# Patient Record
Sex: Male | Born: 1935
Health system: Southern US, Community
[De-identification: ages and names within clinical notes are randomized; demographics above are authoritative.]

## PROBLEM LIST (undated history)

## (undated) DIAGNOSIS — R55 Syncope and collapse: Secondary | ICD-10-CM

## (undated) DIAGNOSIS — IMO0002 Reserved for concepts with insufficient information to code with codable children: Secondary | ICD-10-CM

## (undated) DIAGNOSIS — R269 Unspecified abnormalities of gait and mobility: Secondary | ICD-10-CM

## (undated) DIAGNOSIS — I2510 Atherosclerotic heart disease of native coronary artery without angina pectoris: Secondary | ICD-10-CM

## (undated) DIAGNOSIS — E039 Hypothyroidism, unspecified: Secondary | ICD-10-CM

## (undated) DIAGNOSIS — K649 Unspecified hemorrhoids: Secondary | ICD-10-CM

## (undated) DIAGNOSIS — I639 Cerebral infarction, unspecified: Secondary | ICD-10-CM

## (undated) DIAGNOSIS — S46009A Unspecified injury of muscle(s) and tendon(s) of the rotator cuff of unspecified shoulder, initial encounter: Secondary | ICD-10-CM

## (undated) DIAGNOSIS — N400 Enlarged prostate without lower urinary tract symptoms: Secondary | ICD-10-CM

## (undated) DIAGNOSIS — I6529 Occlusion and stenosis of unspecified carotid artery: Secondary | ICD-10-CM

## (undated) DIAGNOSIS — Z8614 Personal history of Methicillin resistant Staphylococcus aureus infection: Secondary | ICD-10-CM

## (undated) DIAGNOSIS — I4892 Unspecified atrial flutter: Secondary | ICD-10-CM

## (undated) DIAGNOSIS — Z9289 Personal history of other medical treatment: Secondary | ICD-10-CM

## (undated) DIAGNOSIS — G40909 Epilepsy, unspecified, not intractable, without status epilepticus: Secondary | ICD-10-CM

## (undated) DIAGNOSIS — E785 Hyperlipidemia, unspecified: Secondary | ICD-10-CM

## (undated) DIAGNOSIS — I10 Essential (primary) hypertension: Secondary | ICD-10-CM

## (undated) DIAGNOSIS — E119 Type 2 diabetes mellitus without complications: Secondary | ICD-10-CM

## (undated) DIAGNOSIS — I219 Acute myocardial infarction, unspecified: Secondary | ICD-10-CM

## (undated) HISTORY — DX: Type 2 diabetes mellitus without complications: E11.9

## (undated) HISTORY — DX: Unspecified injury of muscle(s) and tendon(s) of the rotator cuff of unspecified shoulder, initial encounter: S46.009A

## (undated) HISTORY — DX: Benign prostatic hyperplasia without lower urinary tract symptoms: N40.0

## (undated) HISTORY — DX: Hyperlipidemia, unspecified: E78.5

## (undated) HISTORY — DX: Essential (primary) hypertension: I10

## (undated) HISTORY — PX: ROTATOR CUFF REPAIR: SHX139

## (undated) HISTORY — DX: Unspecified hemorrhoids: K64.9

## (undated) HISTORY — DX: Epilepsy, unspecified, not intractable, without status epilepticus: G40.909

## (undated) HISTORY — DX: Atherosclerotic heart disease of native coronary artery without angina pectoris: I25.10

## (undated) HISTORY — DX: Personal history of Methicillin resistant Staphylococcus aureus infection: Z86.14

## (undated) HISTORY — DX: Syncope and collapse: R55

## (undated) HISTORY — DX: Occlusion and stenosis of unspecified carotid artery: I65.29

## (undated) HISTORY — DX: Cerebral infarction, unspecified: I63.9

## (undated) HISTORY — DX: Unspecified atrial flutter: I48.92

## (undated) HISTORY — DX: Reserved for concepts with insufficient information to code with codable children: IMO0002

## (undated) HISTORY — DX: Personal history of other medical treatment: Z92.89

## (undated) HISTORY — DX: Unspecified abnormalities of gait and mobility: R26.9

## (undated) HISTORY — PX: COLONOSCOPY: SHX174

## (undated) HISTORY — DX: Hypothyroidism, unspecified: E03.9

## (undated) HISTORY — DX: Acute myocardial infarction, unspecified: I21.9

---

## 2000-04-23 ENCOUNTER — Encounter: Payer: Self-pay | Admitting: Emergency Medicine

## 2000-04-23 ENCOUNTER — Emergency Department (HOSPITAL_COMMUNITY): Admission: EM | Admit: 2000-04-23 | Discharge: 2000-04-23 | Payer: Self-pay | Admitting: Emergency Medicine

## 2000-05-21 ENCOUNTER — Emergency Department (HOSPITAL_COMMUNITY): Admission: EM | Admit: 2000-05-21 | Discharge: 2000-05-21 | Payer: Self-pay | Admitting: Emergency Medicine

## 2000-05-21 ENCOUNTER — Encounter: Payer: Self-pay | Admitting: Emergency Medicine

## 2002-06-07 ENCOUNTER — Encounter: Payer: Self-pay | Admitting: Family Medicine

## 2002-06-07 ENCOUNTER — Encounter: Admission: RE | Admit: 2002-06-07 | Discharge: 2002-06-07 | Payer: Self-pay | Admitting: Family Medicine

## 2002-07-14 ENCOUNTER — Encounter: Payer: Self-pay | Admitting: Cardiology

## 2002-07-14 ENCOUNTER — Inpatient Hospital Stay (HOSPITAL_COMMUNITY): Admission: EM | Admit: 2002-07-14 | Discharge: 2002-07-17 | Payer: Self-pay | Admitting: Emergency Medicine

## 2002-07-14 HISTORY — PX: CORONARY ANGIOPLASTY WITH STENT PLACEMENT: SHX49

## 2002-09-24 ENCOUNTER — Encounter (HOSPITAL_COMMUNITY): Admission: RE | Admit: 2002-09-24 | Discharge: 2002-12-23 | Payer: Self-pay | Admitting: Cardiology

## 2003-05-14 ENCOUNTER — Ambulatory Visit (HOSPITAL_COMMUNITY): Admission: RE | Admit: 2003-05-14 | Discharge: 2003-05-14 | Payer: Self-pay | Admitting: Internal Medicine

## 2003-05-14 ENCOUNTER — Encounter (INDEPENDENT_AMBULATORY_CARE_PROVIDER_SITE_OTHER): Payer: Self-pay | Admitting: *Deleted

## 2004-03-05 ENCOUNTER — Ambulatory Visit: Payer: Self-pay | Admitting: Cardiology

## 2005-04-30 ENCOUNTER — Ambulatory Visit: Payer: Self-pay | Admitting: Cardiology

## 2005-05-20 ENCOUNTER — Ambulatory Visit: Payer: Self-pay

## 2005-05-27 ENCOUNTER — Ambulatory Visit: Payer: Self-pay | Admitting: Cardiology

## 2005-09-11 ENCOUNTER — Emergency Department (HOSPITAL_COMMUNITY): Admission: EM | Admit: 2005-09-11 | Discharge: 2005-09-11 | Payer: Self-pay | Admitting: Emergency Medicine

## 2006-03-22 DIAGNOSIS — I219 Acute myocardial infarction, unspecified: Secondary | ICD-10-CM

## 2006-03-22 HISTORY — DX: Acute myocardial infarction, unspecified: I21.9

## 2007-07-18 ENCOUNTER — Ambulatory Visit: Payer: Self-pay | Admitting: Cardiology

## 2007-07-21 ENCOUNTER — Ambulatory Visit: Payer: Self-pay

## 2007-08-22 ENCOUNTER — Emergency Department (HOSPITAL_COMMUNITY): Admission: EM | Admit: 2007-08-22 | Discharge: 2007-08-22 | Payer: Self-pay | Admitting: Emergency Medicine

## 2007-08-25 ENCOUNTER — Ambulatory Visit (HOSPITAL_COMMUNITY): Admission: RE | Admit: 2007-08-25 | Discharge: 2007-08-25 | Payer: Self-pay | Admitting: Family Medicine

## 2007-09-05 ENCOUNTER — Ambulatory Visit: Payer: Self-pay

## 2007-10-02 ENCOUNTER — Ambulatory Visit (HOSPITAL_COMMUNITY): Admission: RE | Admit: 2007-10-02 | Discharge: 2007-10-02 | Payer: Self-pay | Admitting: Family Medicine

## 2007-10-04 ENCOUNTER — Inpatient Hospital Stay (HOSPITAL_COMMUNITY): Admission: EM | Admit: 2007-10-04 | Discharge: 2007-10-11 | Payer: Self-pay | Admitting: Emergency Medicine

## 2007-10-04 ENCOUNTER — Ambulatory Visit: Payer: Self-pay | Admitting: Cardiology

## 2007-10-10 ENCOUNTER — Encounter: Payer: Self-pay | Admitting: Cardiology

## 2007-10-30 ENCOUNTER — Ambulatory Visit: Payer: Self-pay | Admitting: Cardiology

## 2007-12-07 ENCOUNTER — Ambulatory Visit: Payer: Self-pay | Admitting: Cardiology

## 2008-03-01 ENCOUNTER — Emergency Department (HOSPITAL_COMMUNITY): Admission: EM | Admit: 2008-03-01 | Discharge: 2008-03-02 | Payer: Self-pay | Admitting: Emergency Medicine

## 2008-04-10 ENCOUNTER — Ambulatory Visit (HOSPITAL_COMMUNITY): Admission: RE | Admit: 2008-04-10 | Discharge: 2008-04-11 | Payer: Self-pay | Admitting: Surgery

## 2008-04-10 ENCOUNTER — Encounter (INDEPENDENT_AMBULATORY_CARE_PROVIDER_SITE_OTHER): Payer: Self-pay | Admitting: Surgery

## 2008-04-10 HISTORY — PX: HERNIA REPAIR: SHX51

## 2009-06-05 ENCOUNTER — Encounter: Admission: RE | Admit: 2009-06-05 | Discharge: 2009-06-05 | Payer: Self-pay | Admitting: Otolaryngology

## 2009-06-10 ENCOUNTER — Ambulatory Visit (HOSPITAL_BASED_OUTPATIENT_CLINIC_OR_DEPARTMENT_OTHER): Admission: RE | Admit: 2009-06-10 | Discharge: 2009-06-11 | Payer: Self-pay | Admitting: Otolaryngology

## 2009-06-11 HISTORY — PX: ENDOLYMPHATIC SHUNT DECOMPRESSION: SHX1498

## 2009-06-12 ENCOUNTER — Emergency Department (HOSPITAL_COMMUNITY): Admission: EM | Admit: 2009-06-12 | Discharge: 2009-06-12 | Payer: Self-pay | Admitting: Emergency Medicine

## 2009-07-14 ENCOUNTER — Encounter: Admission: RE | Admit: 2009-07-14 | Discharge: 2009-07-14 | Payer: Self-pay | Admitting: Otolaryngology

## 2010-06-12 LAB — BASIC METABOLIC PANEL
BUN: 12 mg/dL (ref 6–23)
CO2: 32 mEq/L (ref 19–32)
Calcium: 9 mg/dL (ref 8.4–10.5)
Chloride: 98 mEq/L (ref 96–112)
Creatinine, Ser: 0.92 mg/dL (ref 0.4–1.5)
GFR calc Af Amer: >60 mL/min (ref >60)
GFR calc non Af Amer: >60 mL/min (ref >60)
Glucose, Bld: 99 mg/dL (ref 70–99)
Potassium: 4.5 mEq/L (ref 3.5–5.1)
Sodium: 137 mEq/L (ref 135–145)

## 2010-06-12 LAB — POCT HEMOGLOBIN-HEMACUE: Hemoglobin: 13.7 g/dL (ref 13.0–17.0)

## 2010-07-06 LAB — DIFFERENTIAL
Basophils Absolute: 0 10*3/uL (ref 0.0–0.1)
Basophils Relative: 0 % (ref 0–1)
Eosinophils Absolute: 0.1 10*3/uL (ref 0.0–0.7)
Eosinophils Relative: 1 % (ref 0–5)
Lymphocytes Relative: 15 % (ref 12–46)
Lymphs Abs: 1.4 10*3/uL (ref 0.7–4.0)
Monocytes Absolute: 1.2 10*3/uL — ABNORMAL HIGH (ref 0.1–1.0)
Monocytes Relative: 12 % (ref 3–12)
Neutro Abs: 7.1 10*3/uL (ref 1.7–7.7)
Neutrophils Relative %: 72 % (ref 43–77)

## 2010-07-06 LAB — HEMOGLOBIN AND HEMATOCRIT, BLOOD
HCT: 42.4 % (ref 39.0–52.0)
Hemoglobin: 14 g/dL (ref 13.0–17.0)

## 2010-07-06 LAB — CBC
HCT: 38.7 % — ABNORMAL LOW (ref 39.0–52.0)
Hemoglobin: 13.1 g/dL (ref 13.0–17.0)
MCHC: 33.7 g/dL (ref 30.0–36.0)
MCV: 91.9 fL (ref 78.0–100.0)
Platelets: 205 10*3/uL (ref 150–400)
RBC: 4.21 MIL/uL — ABNORMAL LOW (ref 4.22–5.81)
RDW: 14.6 % (ref 11.5–15.5)
WBC: 9.9 10*3/uL (ref 4.0–10.5)

## 2010-07-06 LAB — BASIC METABOLIC PANEL
BUN: 19 mg/dL (ref 6–23)
CO2: 31 mEq/L (ref 19–32)
Calcium: 9.7 mg/dL (ref 8.4–10.5)
Chloride: 97 mEq/L (ref 96–112)
Creatinine, Ser: 1.25 mg/dL (ref 0.4–1.5)
GFR calc Af Amer: >60 mL/min (ref >60)
GFR calc non Af Amer: 57 mL/min — ABNORMAL LOW (ref >60)
Glucose, Bld: 143 mg/dL — ABNORMAL HIGH (ref 70–99)
Potassium: 4.9 mEq/L (ref 3.5–5.1)
Sodium: 137 mEq/L (ref 135–145)

## 2010-08-04 NOTE — Assessment & Plan Note (Signed)
[facility]                            CARDIOLOGY OFFICE NOTE   [name], [name]                     MRN:          [id number]  DATE:10/30/2007                            DOB:          April 03, 1933    PRIMARY CARE PHYSICIAN:  Daniel Laroche, MD, [facility].   REASON FOR PRESENTATION:  Evaluate the patient with presyncope.   HISTORY OF PRESENT ILLNESS:  The patient was recently hospitalized on  October 04, 2007 after a near-syncopal episode.  He does not think he lost  consciousness.  This is his third episode.  The others occurring about 2  and 4 weeks before this event respectively.  This episode happened at  [facility], while he was talking to some parishioners.  He then became  suddenly presyncopal falling down to the ground or into the pew.  There  was no prodrome.  There were no palpitations, presyncope, or syncope.  He was standing when this happened.  This was similar to the other 2  episodes.  He had an extensive neurological workup.  There was suspicion  of high-grade right vertebral artery stenosis, but apparently cerebral  angiogram did not confirm this.  The patient also had other neurologic  evaluation including an EEG.  An echocardiogram demonstrated mild  fibrocalcific changes of the aortic root, but no other significant  abnormalities.  Prior to this hospitalization, the patient had had a  stress perfusion study in our office, which demonstrated no evidence of  ischemia or infarct with an EF 68%.  He also wore an event monitor for 3  weeks and did not have any brady or tachyarrhythmias.  However, he did  not have any of his presyncopal spells while wearing this monitor.  He  now presents for followup.  He has had no further episodes.  He is not  having any chest pressure, neck or arm discomfort.  He is not having any  shortness of breath, PND, or orthopnea.  He has seen a neurologist and  actually it is being considered that  he might have Meniere disease, and  he is being referred for ENT evaluation.   PAST MEDICAL HISTORY:  Coronary artery disease (95% right coronary  artery stenosis with infarct in April 2004.  This was treated with a  stent), well-preserved ejection fraction, dyslipidemia, seizure  disorder, hypothyroidism, degenerative joint disease, left buttocks  abscess (methicillin-resistant Staph aureus), and chronic rotator cuff  repair.   ALLERGIES/INTOLERANCES:  None.   MEDICATIONS:  Aspirin 81 mg daily, Carbatrol 300 mg b.i.d., diazepam 2  mg b.i.d., Lipitor 80 mg daily, levothyroxine 25 mcg daily,  hydrochlorothiazide 12.5 mg daily, lisinopril 40 mg daily, clonidine 0.1  mg b.i.d., Uroxatral 10 mg daily, and metoprolol 50 mg b.i.d.   REVIEW OF SYSTEMS:  As stated in the HPI and otherwise negative for  other systems.   PHYSICAL EXAMINATION:  GENERAL:  The patient is in no distress.  VITAL SIGNS:  Blood pressure 134/76, heart rate 75 and regular, and  weight 246 pounds.  HEENT:  Eyes unremarkable; pupils equal, round, and reactive to light;  fundi not visualized; oral mucosa unremarkable.  NECK:  No jugular venous distention at 45 degrees; carotid upstroke  brisk and symmetrical; no bruits, no thyromegaly.  LYMPHATICS:  No cervical, axillary, or inguinal adenopathy.  LUNGS:  Clear to auscultation bilaterally.  BACK:  No costovertebral angle tenderness.  CHEST:  Unremarkable.  HEART:  PMI not displaced or sustained; S1 and S2 within normal limits;  no S3 and no S4; no clicks, rubs, or murmurs.  ABDOMEN:  Obese; positive bowel sounds, normal in frequency and pitch;  no bruits, rebound, guarding, or midline pulsatile mass; no  hepatomegaly, no splenomegaly.  SKIN:  No rashes, no nodules.  EXTREMITIES:  Pulses 2+ throughout; no edema, cyanosis, or clubbing.  NEURO:  Oriented to person, place, and time; cranial nerves II-XII  grossly intact; motor grossly intact.   ASSESSMENT AND PLAN:   1. Presyncope.  The patient has had an extensive neuro workup and may      have Meniere disease.  He is going to have his ENT evaluation.  If      they do not think that this is the etiology, then the next step      would most likely need to be an implanted loop monitor.  I have      discussed this with the patient.  He will confirm his appointment      with ENT and see me after this.  2. Coronary artery disease.  He had negative stress perfusion study.      He is having no chest pain.  No further cardiovascular testing is      suggested.  3. Hypertension.  Blood pressure is currently well controlled, and he      will continue the medications as listed.  4. Dyslipidemia, per his primary care doctor.  He is on Lipitor.      Goals are LDL less than 100 and HDL greater than 40.  5. Followup.  I will see him back after the ENT appointment as      described.     Daniel Rotunda, MD, [facility]  Electronically Signed    JH/MedQ  DD: 10/30/2007  DT: 10/31/2007  Job #: [id number]   cc:   Daniel Laroche, MD

## 2010-08-04 NOTE — H&P (Signed)
NAME:  [name], [name] NO.:  [id number]   MEDICAL RECORD NO.:  [id number]          PATIENT TYPE:  INP   LOCATION:  5114                         FACILITY:  [facility]   PHYSICIAN:  Jennelle Human. Marisue Humble, MD DATE OF BIRTH:  11/10/1933   DATE OF ADMISSION:  10/04/2007  DATE OF DISCHARGE:                              HISTORY & PHYSICAL   CHIEF COMPLAINT:  Dizziness and fall.  In addition to that a boil.   HISTORY OF PRESENT ILLNESS:  This is a 77 year old gentleman who has had  inner ear problems for approximately 2-3 years with dizziness and  nausea, for which he has been on Carbatrol 300 mg twice a day.  Over the  last 6 weeks, he has had worsening episodes of quit-onset dizziness with  blurred vision where he becomes presyncopal and loses balance and has  fallen on multiple occasions.  This afternoon, he had another episode of  a presyncopal event where he lost balance and fell; however, he lost no  consciousness.  He was standing at the time at church when he suddenly  noticed his vision blurred, darkening visual fields, and fell over, but  was quick to get up.  He states that he has seen his primary care  physician recently and has had a workup.  He is to see a neurologist in  the next 2 months secondary to posterior cerebral artery narrowing and  vertebral artery stenosis.  However, the symptoms have worsened and he  does not feel as if he is going to make it in time to see the  neurologist in 2 months without a significant fall.  He and his family  are quite concerned.  He had a MRA of the neck performed on August 25, 2007, showed the carotid and basilar arteries were widely patent, showed  significant narrowing of the left posterior cerebral artery estimated to  75-90%, and there was evidence of right vertebral severe stenosis in the  upper cervical segment opposite C1 as it enters the foramen magnum.  He  has also had a recent MRI and has also had a event monitor to  rule out  cardiac arrhythmias as a cause of his sudden dizziness and near syncope.   Past medical history is significant for;  1. Coronary artery disease.  He has a history of a 95% right coronary      artery stenosis with infarction in April 2004.  This lesion was      treated.  He has a well-preserved ejection fraction.  2. Dyslipidemia.  3. Seizure disorder.  4. Hypothyroidism.  5. Degenerative joint disease.  6. History of MRSA abscess on his neck approximately 15 months ago.   ALLERGIES:  None.   Medications include;  1. Aspirin 81 mg daily.  2. Metoprolol 50 mg b.i.d.  3. Carbatrol 300 mg b.i.d.  4. Synthroid 25 mg daily.  5. Clonidine 0.1 mg b.i.d.  6. Lisinopril 40 mg daily.  7. Hydrochlorothiazide 12.5 mg daily.  8. Diazepam 2 mg p.o. b.i.d.  9. Lipitor 80 mg nightly.  10.Uroxatral 10 mg daily.  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  No  tobacco, ethanol, or drugs.  He lives with his wife.   FAMILY HISTORY:  Not significant for early coronary artery disease;  however, his dad did have an MI in his 80s.  Mom had a brain aneurysm  and died in her 28s.  He has two siblings.  One brother with a CABG and  the other brother is healthy.   REVIEW OF SYSTEMS:  Negative x10, except as stated in the HPI.   PHYSICAL EXAMINATION:  VITAL SIGNS:  Blood pressure 185/90, heart rate  82, respirations 18, pulse ox 98% on room air.  HEENT: Normal.  Neck: Normal jugular venous pressure, production of  normal.  No evident bruits.  CHEST:  Clear to auscultation bilaterally.  HEART:  Regular rate and rhythm.  No murmurs, gallops, or rubs.  ABDOMEN:  Soft, nondistended, nontender, good bowel sounds.  No  hepatosplenomegaly.  EXTREMITIES:  No clubbing, cyanosis, or edema.  SKIN:  He has a 3-cm x 3-cm boil on the inner aspect of his left upper  buttock.  With fluctuance noted.   ECG unremarkable.  Normal sinus rhythm.  No significant ST and T-wave   changes.   LABORATORY EXAM:  White count is slightly elevated at 12.9, hemoglobin  and hematocrit is 13 and 38.3, platelets of 226.  There is no  significant left shift.  Basic metabolic panel shows a sodium of 136,  potassium 4.1, chloride 100, BUN 15, creatinine 1.3, glucose 115,  ionized calcium 1.15.   ASSESSMENT AND PLAN:  1. Dizziness with near syncopal episodes.  This is most likely related      to his vertebral artery stenosis noted on the MRA of the neck.  We      have spoken with neurology.  They have recommended that we start      Plavix and he get a transcranial Dopplers done.  This is Dr.      Vickey Huger.  He has also had a Holter monitor for workup of cardiac      arrhythmias.  Actually, it was event monitor from what the patient      describes.  He turned this into [facility] approximately a      week ago.  The results of this study need to be obtained.  As      mentioned above, we will start the Plavix.  We will get the      transcranial Dopplers performed and neurology will see in the      morning.  2. History of coronary artery disease, currently stable.  He is on      beta blockade and aspirin.  As mentioned, we will add Plavix as per      neurology request.  We will follow up tomorrow with getting the      results of his Holter/event monitor that was turned in      approximately 1 week ago from [facility].  3. Left buttock abscess.  The abscess was I&D'ed in the ER by myself.      Approximately 5-6 mL of frank pus was aspirated.  This resulted in      a great reduction in the size of abscess; however, there is some      small fluctuance left.  Hopefully. this should drain slowly through      the small incision on left and I started him on vancomycin  1 g q.12      h.  He will get a vancomycin trough level before his fourth dose.  4. Hypertension.  I have currently restarted his metoprolol 50 mg      b.i.d., his clonidine 0.1 mg b.i.d., lisinopril  40      mg daily.  The Clonidine has had a suboptimal dose.  If his blood      pressure is still not under control one might consider clonidine      0.1 mg t.i.d. as b.i.d. will leave areas of uncontrolled blood      pressure.  5. Follow up.  The patient will be followed up tomorrow by neurology      as well as the hospitalist.      Jennelle Human. Marisue Humble, MD  Electronically Signed     GBS/MEDQ  D:  10/04/2007  T:  10/05/2007  Job:  [id number]

## 2010-08-04 NOTE — Assessment & Plan Note (Signed)
[facility]                            CARDIOLOGY OFFICE NOTE   [place], [place] ESTON HESLIN                     MRN:          [id number]  DATE:12/07/2007                            DOB:          [birth year]    REASON FOR PRESENTATION:  Evaluate the patient with presyncope.   HISTORY OF PRESENT ILLNESS:  The patient was hospitalized in July.  His  events of that hospitalization are described extensively in the October 30, 2007, note.  He was supposed to have an ENT evaluation, since we  have not seen a cardiac or neurologic etiology for his events.  However,  he has been unable to get this appointment.  He is still working on it.  However, since I last saw him, he has had no presyncope or syncope.  He  has had no palpitations.  He has had no chest pain or shortness of  breath.  He has been doing some walking, though he has not gotten back  to golfing.  He has been doing his activities of daily living without  any of these complaints.   PAST MEDICAL HISTORY:  Coronary artery disease (95% right coronary  artery stenosis with infarct in 2004.  He was treated with a stent.),  well preserved ejection fraction, dyslipidemia, seizure disorder,  hypothyroidism, degenerative joint disease, left buttocks abscess  (MRSA), chronic rotator cuff injury status post repair.   ALLERGIES/INTOLERANCES:  None.   MEDICATIONS:  1. Aspirin 81 mg daily.  2. Carbatrol 300 mg b.i.d.  3. Diazepam.  4. Lipitor 80 mg daily.  5. Levothyroxine 25 mcg daily.  6. Hydrochlorothiazide 12.5 mg daily.  7. Lisinopril 40 mg daily.  8. Clonidine 0.1 mg b.i.d.  9. Uroxatral.  10.Metoprolol 50 mg b.i.d.  11.Plavix 75 mg daily.   REVIEW OF SYSTEMS:  As stated in the HPI and otherwise negative for  other system.   PHYSICAL EXAMINATION:  GENERAL:  The patient is in no distress.  VITAL SIGNS:  Blood pressure 151/87, heart rate 70 and regular.  NECK:  No jugular venous distention at 45  degrees, carotid upstroke  brisk and symmetric, no bruits, no thyromegaly.  LUNGS:  Clear to auscultation bilaterally.  CHEST:  Unremarkable.  HEART:  PMI not displaced or sustained.  S1 and S2 within normal limits.  No S3, no S4, no clicks, no rubs, no murmurs.  ABDOMEN:  Obese, positive bowel sounds.  Normal in frequency and pitch.  No bruits, no rebound, no guarding, no midline pulsatile mass, no  organomegaly.  EXTREMITIES:  2+ pulse, no edema.   ASSESSMENT AND PLAN:  1. Presyncope.  The patient has had no further events.  I have      encouraged him to pursue the ENT evaluation.  He will let me know      if he has any further symptoms.  2. Coronary artery disease.  No further cardiovascular testing is      suggested.  He will continue with risk reduction.  3. Hypertension.  His blood pressure is slightly elevated today.  It      is well  controlled at home.  He will continue medications as      listed.  He needs a weight loss for continued blood pressure      control.  4. Dyslipidemia per his primary care doctor.  The goal is an LDL less      than 100 and HDL greater than 40.  5. Followup.  I will see him back in 6 months or sooner if he has any      problems.     Rollene Rotunda, MD, [facility]  Electronically Signed    JH/MedQ  DD: 12/07/2007  DT: 12/07/2007  Job #: [id number]   cc:   [address] [place], [place]

## 2010-08-04 NOTE — Consult Note (Signed)
NAME:  [name], [name] NO.:  [id number]   MEDICAL RECORD NO.:  [id number]          PATIENT TYPE:  INP   LOCATION:  5114                         FACILITY:  [facility]   PHYSICIAN:  Lennie Muckle, MD      DATE OF BIRTH:  12-20-1933   DATE OF CONSULTATION:  10/07/2007  DATE OF DISCHARGE:                                 CONSULTATION   REASON FOR CONSULTATION:  Left buttock abscess.   HISTORY OF PRESENT ILLNESS:  [name] is a 77 year old male who was  apparently admitted on October 04, 2007 due to syncopal episodes and  falling at home.  In his workup, was known to have a swelling and  possible abscess in his left buttock.  He had had previous incision and  drainage on October 04, 2007 in the emergency department with a small  amount of purulent fluid.  He was then placed on vancomycin, but has not  had significant amount of improvement.  He noted a pimple last week.  His white count is elevated at 14.8 today, it was previously 14.7 and he  has had fevers, 99.6.  He has had some uncomfortable sensation in his  buttock region.  Previously, he had an abscess on his left ear and has  been cultured to be MRSA positive.   PAST MEDICAL HISTORY:  Significant for:  1. Coronary artery disease.  2. Hyperlipidemia.  3. Hypothyroidism.  4. Arthritis.  5. Syncopal episodes.  6. Enlarged prostate.   MEDICATIONS:  While in the hospital include alprazolam, Tegretol,  Catapres, Plavix, Valium, Lovenox, Synthroid, lisinopril, metoprolol,  Crestor, vancomycin, morphine, oxycodone, and Phenergan.   SOCIAL HISTORY:  He is married.  He is a Runner, broadcasting/film/video.  No to current tobacco  or alcohol use.   FAMILY HISTORY:  His mother has a brain aneurysm.   REVIEW OF SYSTEMS:  As per the patient's chart, please review this.   PHYSICAL EXAMINATION:  GENERAL:  He is lying in the bed in no acute  distress.  VITAL SIGNS:  Temperature is 99.4 currently and blood pressure 124/72  and pulse 79.  EXTREMITIES:  Focused examination of the perineal area reveals a marked  induration on the left buttock cheek with erythema, somewhat tender to  palpation.  There is skin flap necrosis over the portion of this area.   ASSESSMENT AND PLAN:  Left buttock abscess.  Procedure:  I did perform incision and drainage of the abscess with #11  blade after anesthesizing the skin with 1% lidocaine.  There was  purulent fluid pocket measuring approximately 4 x 3 cm.  This was packed  with gauze.  I  would recommend continuing with antibiotics, starting b.i.d. dressing  changes.  He can shower when able, wash the area, and we will continue  to monitor.  Hopefully, this will require no further treatment, since it  has been drained today.      Lennie Muckle, MD  Electronically Signed     ALA/MEDQ  D:  10/07/2007  T:  10/08/2007  Job:  [id number]

## 2010-08-04 NOTE — Assessment & Plan Note (Signed)
[facility]                            CARDIOLOGY OFFICE NOTE   NAME:LOFTISYousif, [name]                     MRN:          [id number]  DATE:07/18/2007                            DOB:          09/04/1933    PRIMARY CARE PHYSICIAN:  Dr. Benjiman Core, [facility].   REASON FOR PRESENTATION:  Evaluate the patient with chest pain and  coronary disease.   HISTORY OF PRESENT ILLNESS:  The patient is a pleasant 77 year old  gentleman with a history of coronary disease as described below.  It has  been a couple of years since I last saw him.  He has been getting some  chest discomfort.  He thinks this is his indigestion.  It typically  happens after he is eating.  It is substernal pressure.  It goes away  spontaneously.  He does not think that it is like his previous angina.  There is no radiation to his neck into his arms.  No associated nausea,  vomiting or diaphoresis.  There is no associated shortness of breath.  Does get dyspneic when he first starts to walk.  He has a little chest  pressure.  The most exerting thing he does is weed eat.  It does not  necessarily bring on the symptoms with this.  He has had no cardiac  evaluation in quite awhile.  His weight has been slightly slowly  increasing.   PAST MEDICAL HISTORY:  1. Coronary artery disease (95% right coronary artery stenosis with      infarct April 2004.  This was treated with stent).  2. Well-preserved ejection fraction.  3. Dyslipidemia.  4. Seizure disorder.  5. Hypothyroidism.  6. Degenerative joint disease.   ALLERGIES:  NONE.   MEDICATIONS:  1. Aspirin 81 mg daily.  2. Carbatrol.  3. Diazepam 2 mg b.i.d.  4. Toprol 100 mg daily.  5. Lipitor 80 mg daily.  6. Levothyroxine 25 mcg daily.  7. Hydrochlorothiazide 12.5 mg daily.  8. Lisinopril 40 mg daily.  9. Clonidine 0.1 mg b.i.d.  10.Uroxatral.   REVIEW OF SYSTEMS:  As stated in the HPI, otherwise negative for other  systems.   PHYSICAL EXAMINATION:  GENERAL:  The patient is in no distress.  VITAL SIGNS:  Blood pressure 145/79, heart rate 73 and regular, weight  239 pounds, body mass index 36.  HEENT:  Eyelids are unremarkable, pupils equal, round, react to light,  fundi not visualized, oral mucosa unremarkable.  NECK:  No jugular distention at 45 degrees, carotid upstroke brisk and  symmetrical.  No bruits, thyromegaly.  LYMPHATICS:  No cervical, axillary, inguinal adenopathy.  LUNGS:  Clear to auscultation bilaterally.  BACK:  No costovertebral angle tenderness.  CHEST:  Unremarkable.  HEART:  PMI not displaced or sustained, S1-S2 within normal.  No S3, S4.  No clicks, rubs, murmurs.  ABDOMEN:  Morbidly obese, positive bowel sounds normal in frequency and  pitch, no bruits, rebound, guarding or midline pulsatile mass.  No  hepatomegaly, no splenomegaly.  SKIN:  No rashes.  No nodules.  EXTREMITIES:  Pulse 2+ throughout.  No edema, cyanosis or  clubbing.  NEUROLOGICAL:  Oriented to person, place and time, cranial nerves II-XII  grossly intact, motor grossly intact.   STUDIES:  EKG sinus rhythm, rate 61, axis within normal limits,  intervals within normal limits, borderline voltage criteria for left  ventricle hypertrophy, no acute ST-T wave changes.   ASSESSMENT/PLAN:  1. Chest.  The patient does have some chest discomfort that is      somewhat atypical.  However, he has known coronary disease and has      not had any testing in quite awhile.  The pretest probability of      obstructive coronary disease is moderately high.  Given this, an      exercise perfusion study is indicated.  If this is normal.  I will      assume that his discomfort is GI.  He does, however, continue to      need aggressive secondary risk reduction.  2. Hypertension.  Blood pressure is elevated slightly.  He has a blood      pressure cuff at home, but does not work well and he spent $100 on      it, so he is not  getting a new one.  I am not going to change his      medications, but I am going to insist on weight loss as described      below.  3. Obesity.  The patient is morbidly obese.  We discussed him joining      [employer].  He needs to do something to reverse this process.  4. Shortness of breath.  He does have some mild dyspnea that is      probably related to his weight and deconditioning.  It is not an      overt problem, and I will make no changes to his regimen based on      it specifically to treat this but rather would address the weight      loss as above.  5. Follow-up, I would like to see the patient back in about 6 months      or sooner if needed.     Rollene Rotunda, MD, [facility]  Electronically Signed   JH/MedQ  DD: 07/18/2007  DT: 07/18/2007  Job #: [id number]   cc:   Billee Cashing, MD

## 2010-08-04 NOTE — Discharge Summary (Signed)
NAME:  [name], [name] NO.:  [id number]   MEDICAL RECORD NO.:  [id number]          PATIENT TYPE:  INP   LOCATION:  5114                         FACILITY:  [facility]   PHYSICIAN:  Altha Harm, MDDATE OF BIRTH:  1933-08-11   DATE OF ADMISSION:  10/04/2007  DATE OF DISCHARGE:                               DISCHARGE SUMMARY   ADDENDUM TO DISCHARGE SUMMARY:  Please see the discharge summary from  July 21 and from July 19 for final discharge diagnoses.   HOSPITAL COURSE FROM YESTERDAY TO TODAY:  The patient's discharge was  deferred pending cardiology evaluation and recommendations.  The  cardiologist came back to see the patient at 8 p.m. last night and per  his notes in the chart did not feel that after reviewing the studies  that there was anything more that could be of any benefit to the patient  during this hospitalization.  The recommendation is that he see Dr.  Antoine Poche in follow-up.  Please also note that the patient is followed by  Dr. Vickey Huger, whose office will contact him to expedite the outpatient  appointment.  There has been no change in medications, no change in  follow-up.      Altha Harm, MD  Electronically Signed     MAM/MEDQ  D:  10/11/2007  T:  10/11/2007  Job:  [id number]

## 2010-08-04 NOTE — Discharge Summary (Signed)
NAME:  [name], [name] NO.:  [id number]   MEDICAL RECORD NO.:  [id number]          PATIENT TYPE:  INP   LOCATION:  5114                         FACILITY:  [facility]   PHYSICIAN:  Elliot Cousin, M.D.    DATE OF BIRTH:  02/11/1934   DATE OF ADMISSION:  10/04/2007  DATE OF DISCHARGE:                               DISCHARGE SUMMARY   DATE OF DISCHARGE:  To be determined.   DISCHARGE DIAGNOSES:  1. Presyncope and dizziness, thought to be secondary to      cerebrovascular disease.  2. Left posterior cerebral artery stenosis (75-90%) and suspicion of a      high-grade stenosis of the right vertebral artery per MRA of the      neck and head on August 25, 2007.  Currently, the results of the      cerebral angiogram, transcranial Doppler, and EEG are pending.      Plavix was added to aspirin therapy during the hospitalization.  3. Remote cerebellar strokes per MRI of the brain on August 25, 2007.  4. Coronary artery disease and hypertension.  Stable.  According to      the patient, he underwent a recent outpatient stress test which was      negative.  Also, he underwent Holter monitoring by Dr. Antoine Poche.      The results are unknown.  5. Seizure disorder.  6. Left buttock abscess.  Status post incision and drainage by fellow,      Dr. Marisue Humble, on October 04, 2007, and status post incision and      drainage by general surgeon, Dr. Freida Busman, on October 07, 2007.  The      abscess is growing out methicillin resistant Staphylococcus aureus.  7. Chronic right rotator cuff tear.  Conservative treatment per      orthopedic surgeon, Dr. Dion Saucier.   DISCHARGE MEDICATIONS:  To be determined at the time of hospital  discharge.   CONSULTATIONS:  1. Neurologist, Pramod P. Pearlean Brownie, MD.  2. Orthopedic surgeon, Eulas Post, MD.  3. Lennie Muckle, MD.   PROCEDURES PERFORMED:  1. Cerebral angiogram performed on September 26, 2007, by Dr. Corliss Skains.      Results are pending.  2. Transcranial Doppler  performed on October 05, 2007.  Results are      pending.  3. EEG performed on October 06, 2007.  Results are pending.  4. X-ray of the right shoulder on October 04, 2007.  The results revealed      probable rotator cuff tear.  Degenerative changes as described.   HISTORY OF PRESENT ILLNESS:  The patient is a 77 year old man with a  past medical history significant for coronary artery disease, seizure  disorder, hypothyroidism, and hyperlipidemia.  He presented with a chief  complaint of dizziness and falls.  The patient denied outright loss of  consciousness.  His symptoms have been occurring for several weeks up to  a couple of months.  He was evaluated in the outpatient setting with a  MRA of the neck and brain on August 25, 2007.  The results revealed  significant  extracranial stenosis of the right vertebral system and the  posterior coronary artery.  The patient also was evaluated with an  outpatient Holter monitor by Dr. Antoine Poche, however, the results are  unknown.  He also recently underwent a stress test approximately 2  months ago and he was told that the stress test was normal.  The patient  was scheduled to see a local neurologist in the outpatient setting prior  to his admission.  The patient was admitted for further evaluation and  management.   For additional details, please see the dictated history and physical.   HOSPITAL COURSE:  1. Frequent falls, presyncope, remote cerebellar infarct, left      posterior cerebral artery stenosis, and suspicion of high-grade      stenosis of the right vertebral artery.  The patient was started on      Plavix 75 mg daily and aspirin at 81 mg daily was continued.  He      was maintained on all of his chronic cardiac medications.  At the      time of the initial assessment, he was alert and oriented and      hemodynamically intact.  For further evaluation, a TSH,      carbamazepine level, and lipid profile were ordered.  The patient's      TSH  was within normal limits at 2.44.  The carbamazepine level was      12.7, marginally above therapeutic range.  His fasting lipid      profile revealed a total cholesterol of 158, triglycerides of 79,      HDL of 57, and LDL of 85.  Neurologist, Dr. Pearlean Brownie, was consulted.      He recommended further evaluation with transcranial Dopplers,      cerebral angiogram, and an EEG.  The angiogram, transcranial      Doppler study, and EEG have all been completed, however, the      results are pending.  During the hospitalization, the patient has      had no complaints of presyncope, dizziness, or the feeling of being      faint.  He has been ambulating in his room without any symptoms.  2. Seizure disorder.  The patient is being maintained on therapy with      Carbatrol.  As above, his carbamazepine level was just marginally      supratherapeutic.  No changes have been made in the dose of      Carbatrol.  The results of the EEG are pending.  3. Coronary artery disease and hypertension.  The patient was      maintained on his chronic cardiac medications during the      hospitalization.  He reported that a recent stress test performed      by Dr. Antoine Poche was negative.  The results of the Holter monitor      have not been communicated to the patient.  He completed the Holter      monitoring approximately 1 week ago.  4. Left buttock abscess secondary to methicillin resistant      Staphylococcus aureus.  The patient complained of swelling and pain      of his left buttock.  Dr. Marisue Humble performed a local I&D of the area      in the emergency department.  The patient was started on vancomycin      empirically.  The specimen was sent for culturing and it did      eventually grow  out MRSA.  The patient is currently afebrile,      however, his white blood cell count is slightly elevated at 12.7.      He has completed 4 days of vancomycin therapy.  On my exam      yesterday, it was noted that the left  buttock was more indurated      and fluctuant.  Therefore, general surgeon, Dr. Freida Busman, was      consulted for.  She performed an additional incision and drainage      yesterday and per her assessment, the fluid pocket measured 4 x 3      cm.  The patient is undergoing wound care with packing and dressing      changes under the guidance of Dr. Freida Busman.  5. Chronic right rotator cuff tear.  Because of the frequent falls,      the patient complained of right shoulder pain.  An x-ray was      ordered and revealed findings consistent with a rotator cuff tear.      Orthopedic surgeon, Dr. Dion Saucier, who was on-call for Dr. Eulah Pont,      evaluated the patient.  Per his assessment, the rotator cuff tear      was chronic and therefore, he recommended conservative treatment.      The patient has very few complaints of right shoulder pain.   DISCHARGE DISPOSITION:  The patient is not quite ready for hospital  discharge yet.  The results of the cerebral angiogram, transcranial  Doppler, and EEG are pending.      Elliot Cousin, M.D.  Electronically Signed     DF/MEDQ  D:  10/08/2007  T:  10/08/2007  Job:  [id number]   cc:   Rollene Rotunda, MD, [facility]  Ernestina Penna, M.D.  Pramod P. Pearlean Brownie, MD  Eulas Post, MD  Lennie Muckle, MD

## 2010-08-04 NOTE — Discharge Summary (Signed)
NAME:  [name], MORSS NO.:  [id number]   MEDICAL RECORD NO.:  [id number]          PATIENT TYPE:  INP   LOCATION:  5114                         FACILITY:  [facility]   PHYSICIAN:  Altha Harm, MDDATE OF BIRTH:  June 23, 1933   DATE OF ADMISSION:  10/04/2007  DATE OF DISCHARGE:  10/10/2007                               DISCHARGE SUMMARY   ADDENDUM:  Please refer to interim discharge summary by Dr. Sherrie Mustache for details of  discharge diagnoses.   ADD TO DISCHARGE DIAGNOSES:  1. Presyncope or dizziness, not thought to be secondary to      cerebrovascular disease, but rather felt to be cardiac in nature      versus cataplexy.  2. Suspect cataplexy.  Patient to follow up with Dr. Vickey Huger in her      office at an appointment date that she will call the patient to      arrange.  3. Fall secondary to presyncopal drop attacks.   DISCHARGE MEDICATIONS:  1. Aspirin 81 mg p.o. daily.  2. Toprol-XL 100 mg p.o. daily.  3. Carbatrol 800 mg p.o. b.i.d.  4. Synthroid 25 mg p.o. daily.  5. Clonidine 0.1 mg p.o. b.i.d.  6. Lisinopril 40 mg p.o. daily.  7. Hydrochlorothiazide 12.5 mg p.o. daily.  8. Diazepam 2 mg p.o. b.i.d.  9. Lipitor 80 mg p.o. at bedtime.  10.Uroxatral  10 mg p.o. daily.  11.Imipramine 25 mg p.o. b.i.d.  12.Doxycycline 100 mg p.o. b.i.d. x9 days.   The patient is unsure as to whether or not he was taking Diovan home.  However, he did not receive any Diovan here in the hospital.  The  patient should check with his prescribing physician in to see whether or  not he does have Diovan and whether or not he needs to continue it.   DIAGNOSTIC STUDIES:  1. EEG done on October 06, 2007 shows normal record with the patient      awake and asleep.  2. Cerebral angiogram, which shows mild atherosclerotic disease      involving the internal carotid arteries in the cavernous segments.      Mild to moderate narrowing of the left posterior cerebellar artery.      Focal  areas of mild narrowing of the distal left internal carotid      artery at the cranial skull base, associated with mild fusiform      prominence.  This may represent sequelae of an old dissection.      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   HOSPITAL COURSE FROM October 08, 2007 UP UNTIL TODAY:  1. In terms of the presyncopal episodes, the patient had a thorough      workup, both from a cardiac standpoint and from a neurological      standpoint.  The neurologist feels that any of the findings      neurologically did not account for the patient's symptoms.  As a      matter of fact, the findings on the cerebral angiogram did not  require any intervention at this time, and recommendations are that      the patient have a follow-up MRA, MRI in about 6 months.  I have      had some long discussions with the patient as to the nature of      these episodes that are occurring.  The episode started off with      him having significant dizziness.  However, as time has progressed,      the dizziness has pretty much resolved, and the patient is having      these drop attacks without any real warning.  The patient has seen      an ENT as an outpatient and has been advised by the ENT that if the      rest of the workup is negative that he should come back to further      workup for the possibility of Meniere's disease.  The patient does      report some daytime sleepiness.  I have discussed this with Dr.      Pearlean Brownie.  He is also in agreement that this could possibly represent      cataplexy for this patient, and would probably benefit from workup      as an outpatient with a sleep study.  The patient's neurologist is      Dr. Vickey Huger, who is a specialist in sleep medicine.  The patient      does have an appointment to see Dr. Vickey Huger in August 2009.      However, Dr. Pearlean Brownie will speak to Dr. Vickey Huger to expedite that      appointment and to  move it forward so that the patient be seen      earlier in the office.  In terms of his cardiac workup, is has      being reported by the nurse from Dr. Jenene Slicker office that the      Holter monitor was essentially negative.  I did ask someone from      the [facility] to actually evaluate the results in some      detail to make sure that there were actually no abnormal findings      on the Holter monitor.  An echocardiogram has been ordered by the      cardiologist who saw the patient yesterday.  However, I doubt that      it will add very much to the patient's already extensive workup, as      the patient had a stress test in the very recent past which was      negative, and this stress test occurred within the time period that      the patient was having these episodes.  However, his discharge is      pending a return visit by cardiology today for any further      recommendations.  Thus, in essence, at this point, the etiology for      the patient episodes still has not been determined, and the patient      will require further workup as an outpatient.  Namely, he may need      to see the neurologist for further sleep studies and/or ENT for      evaluation for the vestibular causes of this.  2. In terms of the buttocks abscess which grew out MRSA, the MRSA is      sensitive to tetracycline, Bactrim, and clindamycin.  The patient  is being discharged home on doxycycline 100 mg b.i.d. for an      additional 9 days to complete 14 days of therapy.  The wound is to      be dressed on a daily basis and to be impacted with Iodoform.  The      patient is to perform sitz baths twice a day.  I will ask that home      care nursing be sent to the patient's home to assist with      instructing the family in the proper wound management and to      monitor the wound.  3. In terms of his hypertension, the patient's blood pressures have      been running right around systolics in the 140s.   The patient gives      a question of a history of being on Diovan at home, and he is not      sure of that.  The patient's wife is to bring the medications.      However, she did not bring Diovan, and thinks that she left that      medication at home.  At this time of discharge, we will send the      patient out on the above-stated medications, and that is without      any instruction to take Diovan.  I will refer the patient back to      his primary care physician and his cardiologist to clarify whether      or not he should be continuing on the Diovan.  Otherwise, the      patient has remained stable in the hospital, and the patient is      stable for discharge.   FOLLOWUP:  1. With Dr. Vickey Huger.  The patient currently has an appointment      scheduled on November 04, 2007.  However, Dr. Vickey Huger will call the      patient to arrange an earlier appointment with the patient.  2. The patient is also to follow up with his cardiologist as      instructed by him.  3. To follow up with his primary care physician as needed.   DIETARY RESTRICTIONS:  The patient should be on a heart-healthy, low-  sodium diet.   PHYSICAL RESTRICTIONS:  I have instructed to the family that the patient  at this point will require 24/7 supervision, as the episodes that he is  experiencing present without any warning.  I will order a wheelchair for  the patient so that he has one available when he has to incur any  distances or being out, given the nature of these episodes.      Altha Harm, MD  Electronically Signed     MAM/MEDQ  D:  10/10/2007  T:  10/10/2007  Job:  [id number]   cc:   Melvyn Novas, M.D.  Cardiologist  Primary Care Physician

## 2010-08-04 NOTE — Consult Note (Signed)
NAME:  Daniel, [name] NO.:  [id number]   MEDICAL RECORD NO.:  [id number]          PATIENT TYPE:  INP   LOCATION:  5114                         FACILITY:  [facility]   PHYSICIAN:  Pramod P. Pearlean Brownie, MD    DATE OF BIRTH:  Aug 17, 1933   DATE OF CONSULTATION:  DATE OF DISCHARGE:                                 CONSULTATION   REFERRING PHYSICIAN:  [facility].   REASON FOR REFERRAL:  Possible TIAs, dizziness and fall.   HISTORY OF PRESENT ILLNESS:  [name] is a 77 year old Caucasian male  with multiple medical problems.  For last 5-6 weeks, he states that he  has had several episodes of recurrent stereotypical dysfunction.  He  states the first he noticed is that his head starts going crazy, his  vision gets blurred and then all of a sudden he falls down.  Most of the  times, he is unable to hold himself to sudden fall.  He does not lose  consciousness.  He is able to get up easily and he has hurt himself  several times including the present admission when he hurt his right  shoulder.  He denies true vertigo, nausea, focal extremity weakness,  numbness or tingling.  He states the episode lasts only for 5-15 seconds  and never lasted longer.  He has had no focal neurological symptoms  prior to or after these episodes.  Denies any accompanying chest pain,  palpitations or shortness of breath.  He has had an outpatient workup  for this including an MRA of the neck and brain done on August 25, 2007,  which showed no significant extracranial carotid stenosis.  There was a  questionable area of focal stenosis of the right vertebral artery in the  V4 segment but there was good distal flow and no significant basilar  stenosis.  There was severe stenosis of left posterior cerebral artery  in the mid segment.  The patient states he has seen Dr. Vickey Huger in the  past for episodes of what sound like cough related presyncopal events  which he states.  However, these are episodes of  seizures and he has  been on Tegretol.  I do not have Dr. Oliva Bustard prior notes to confirm  this.   PAST MEDICAL HISTORY:  1. Coronary artery disease.  2. Hyperlipidemia.  3. Hypothyroidism.  4. Arthritis.  5. These presyncopal episodes plus  6. Prostate enlargement.   HOME MEDICATIONS:  Uroxatral, aspirin, Tegretol, clonidine, Plavix,  Valium, Lovenox, hydrochlorothiazide, Synthroid, lisinopril,  metoprololl, Crestor and Micromycin .   MEDICATIONS ALLERGIES:  None.   SOCIAL HISTORY:  The patient is a Runner, broadcasting/film/video.  He lives in [place]  with his wife, quit smoking years ago.  Does not drink alcohol or drugs.   FAMILY HISTORY:  Noncontributory except mother had brain aneurysm.   PHYSICAL EXAM:  GENERAL:  Reveals a pleasant elderly Caucasian male who  is at present not in distress, afebrile.  VITAL SIGNS:  Temperature 98.7, blood pressure in lying position 142/79  with pulse rate of 94, in the sitting position, blood pressure is  178/96, heart rate 99 and in standing position, it is 152/87 with a  heart rate of 115.  He did not have orthostasis.  Sat 93%  on room air.  NEUROLOGICAL:  The patient is awake, alert, he is oriented to time,  place and person.  There is no aphasia, apraxia or dysarthria.  Eye  movements are full range.  Face is symmetric.  Palatal movements normal.  Tongue is midline.  MOTOR SYSTEM:  Reveals no upper extremity drift, symmetric strength,  tone, reflexes, coordination and sensation.  His gait was not tested.   DATA REVIEWED:  MRI scan of the brain done yesterday reveals no acute  abnormality.  MRA of the brain from August 25, 2007, were reviewed and  shows a focal area of signal loss in the right V3-V4 segment of the  vertebral artery with good distal flow in the terminal vertebral and  basilar arteries.  There is an area of high-grade stenosis of the mid  segment of the left posterior cerebral artery and mild stenosis of the  right posterior cerebral  artery as well.  Both carotids showed no  significant stenosis intra or extracranially.   IMPRESSION:  A 77-year gentleman with recurrent stereotypical episodes  of visual dysfunction and sudden fall without loss of consciousness of  unclear significance.  Possibilities include rule out presyncopal  episodes versus vertebrobasilar transient ischemic attack.  Drop attacks  or cataplexy would be less likely.   PLAN:  I would recommend a further evaluation by checking an EEG given  his prior history of seizures, as well as doing four-vessel cerebral  catheter angiogram to further evaluate his right vertebral artery  stenosis.  If indeed this is confirmed, he may need to consider elective  angioplasty stenting in the future.  The patient may also benefit by  having an outpatient sleep study to evaluate for any sleep disorder  which may be causing cataplexy like events.  I had a long discussion  with the patient, the wife and daughter regarding his presentation,  discussed plan for evaluation and answered questions.           ______________________________  Sunny Schlein. Pearlean Brownie, MD     PPS/MEDQ  D:  10/05/2007  T:  10/06/2007  Job:  [id number]

## 2010-08-04 NOTE — Consult Note (Signed)
NAME:  [name], [name] NO.:  [id number]   MEDICAL RECORD NO.:  [id number]          PATIENT TYPE:  INP   LOCATION:  5114                         FACILITY:  [facility]   PHYSICIAN:  Eulas Post, MD    DATE OF BIRTH:  10/28/1933   DATE OF CONSULTATION:  10/07/2007  DATE OF DISCHARGE:                                 CONSULTATION   REQUESTING PHYSICIAN:  [facility].   CHIEF COMPLAINT:  Right shoulder pain.   HISTORY:  [name] is a 77 year old man who has had a history  of multiple recent falls.  He says actually that he first injured his  shoulder about 20 years ago and he had fairly significant problems with  it, but then was able to overcome his difficulties and get full  function.  He says that at his most recent fall, he had an exacerbation  of his shoulder pain, however, it has been getting dramatically better  ever since that fall.  He says currently he does not have any problems  with his shoulder and he has full function.  When he did have pain, it  was located directly over the lateral region of the shoulder and  associated with a limitation in motion.  Use of the shoulder made it  worse and rest made it better.  Currently, however, he says he does not  have any symptoms.   REVIEW OF SYSTEMS:  He denies any recent shortness of breath, chest  pain, or bowel or bladder problems.   FAMILY HISTORY:  Significant for a father who died of a heart attack and  brother with coronary artery disease as well.   PHYSICAL EXAMINATION:  CONSTITUTIONAL:  He is lying in bed in no acute  distress.  He is well developed and well nourished.  EYES:  Extraocular movements are intact.  HEENT:  His oropharynx is clear, and he has a midline tongue.  NECK:  He has good range of motion with no radiculopathy.  He has  midline trachea.  LYMPHATIC:  He has no axillary or cervical lymphadenopathy.  GI:  His abdomen is soft with no rebound or guarding and no  organomegaly.  CARDIAC:  He has no peripheral edema, and he is slightly tachycardic.  MUSCULOSKELETAL:  His right shoulder has essentially 5/5 strength with  forward flexion and abduction.  His range of motion is 0-170 degrees of  forward flexion.  His infraspinatus seems to be working.   X-rays demonstrate evidence for chronic rotator cuff disease with  superior migration of the humeral head.   IMPRESSION:  Right shoulder rotator cuff disease, chronic.   PLAN:  Given that he is not having any current symptoms, I would not  recommend any type of intervention.  If he does develop symptoms, then  he may benefit from subacromial/intra-articular cortisone injection or  alternatively physical therapy.  Surgery may be an option, but I would  be surprised if he had a reparable care at this point.  I have  given him our office information and encouraged him to follow up with [place]  if  his symptoms warrant further followup.   Thank you for this consultation.      Eulas Post, MD  Electronically Signed     JPL/MEDQ  D:  10/07/2007  T:  10/07/2007  Job:  [id number]

## 2010-08-04 NOTE — Procedures (Signed)
EEG NUMBER:  [id number].   CLINICAL HISTORY:  The patient is a 77 year old with vertebral artery  stenosis, near-syncopal episodes, and an abscess in his buttocks.  He  has had a 5-6 weeks history of blurred vision with sudden falling.  He  was able to get up.  He hurt his right shoulder in a fall.  He has had  no loss of consciousness, vertigo, nausea, weakness, or tingling.  Episodes last 5-15 seconds.  The patient has been seen for presyncopal  episodes with cough-related events.  He has history of coronary artery  disease, dyslipidemia, hypothyroidism, arthritis, presyncopal episodes,  and prostate enlargement. (780.2)   PROCEDURE:  The tracing was carried out on a 32-channel digital Cadwell  recorder reformatted into 16-channel montages with one devoted to EKG.  The patient was awake and asleep during the recording.  The  International 10/20 system lead placement was used.   MEDICATIONS:  Uroxatral, aspirin, Tegretol, Catapres, Plavix, Valium,  Lovenox, fentanyl, heparin, Apresoline, hydrochlorothiazide, Synthroid,  lisinopril, Lopressor, Versed, Crestor, vancomycin, Tylenol, oxycodone,  and Phenergan.   DESCRIPTION OF FINDINGS:  Dominant frequency is less than 10 microvolt  alpha and beta range of activity.  The patient becomes drowsy with mixed  frequency theta and delta range activity and drifts into natural sleep  with vertex sharp waves and symmetric and synchronous sleep spindles.   Activating procedures were not carried out.  EKG showed a regular sinus  rhythm with ventricular response of 84 beats per minute.   IMPRESSION:  Normal record with the patient awake and asleep.      Deanna Artis. Sharene Skeans, M.D.  Electronically Signed     [id number]  D:  10/06/2007 14:24:48  T:  10/07/2007 02:19:14  Job #:  [id number]   cc:   Pramod P. Pearlean Brownie, MD  Fax: [phone]

## 2010-08-04 NOTE — Consult Note (Signed)
NAME:  [name], [name] NO.:  [id number]   MEDICAL RECORD NO.:  [id number]          PATIENT TYPE:  INP   LOCATION:  5114                         FACILITY:  [facility]   PHYSICIAN:  [name]. Daniel Som, MD, FACCDATE OF BIRTH:  10-27-1933   DATE OF CONSULTATION:  DATE OF DISCHARGE:                                 CONSULTATION   [name] is a 77 year old gentleman with a past medical history of  hypertension, hyperlipidemia, coronary artery disease, seizure disorder,  who I am asked to evaluate for syncope.  The patient's cardiac history  dates back to April 2004.  At that time, he apparently presented with an  inferior infarct.  He had a cardiac catheterization that showed  nonobstructive coronary disease other than a 95% right coronary artery.  He had PCI at that time.  Noticed ejection fraction was 70%.  He last  saw [name] on July 18, 2007.  He was complaining of atypical  chest pain at that time.  He apparently had a Myoview that he states was  unremarkable, but I do not have those results available.  The patient  states that over the past 6 weeks he has had 3 separate episodes of  falling.  The first episode occurred after he walked to his mailbox  and returned to his porch.  He then fell onto the bushes.  There was no  preceding symptoms including no chest pain, no shortness of breath, no  palpitations, no nausea or vomiting, no incontinence, or no seizure  activity.  He states, he remembers falling, it is unclear whether there  was complete loss of consciousness.  He states that as soon as he fell  into the bushes, he was complaining awake and alert.  He has had 2  subsequent episodes, one occurring while at church while he was talking  to one of his deacons.  Note these episodes do not occur with change of  position.  He has been evaluated previously and an MRA suggested  vertebral-basilar insufficiency.  However, he underwent an angiogram  that was  unremarkable.  Neurology has been seeing the patient also  ordered an EEG.  He felt, this may be cardiac related and cardiology was  asked to further evaluate.  Note the patient also had a CardioNet  monitored, ordered by his primary care physician that was performed at  [facility].  The patient does state that he had symptoms while  he had the monitor, but I do not have those strips available.  Because  of his syncope, Cardiology was asked to further evaluate.   PRESENT MEDICATIONS:  1. Plavix 75 mg p.o. daily.  2. Lopressor 50 mg p.o. b.i.d.  3. Aspirin 81 mg p.o. daily.  4. Carbamazepine 300 mg p.o. b.i.d.  5. Synthroid 25 mcg p.o. daily.  6. Clonidine 0.1 mg p.o. b.i.d.  7. Lisinopril 40 mg p.o. daily.  8. Hydrochlorothiazide 12.5 mg p.o. daily.  9. Crestor 40 mg p.o. daily.  10.Uroxatral 10 mg p.o. daily.  11.Vancomycin protocol.  12.Enoxaparin 40 mg subcu daily.  13.Valium 2 mg p.o. b.i.d.  ALLERGIES:  He has no known drug allergies.   SOCIAL HISTORY:  He does not smoke, nor does he consume alcohol.   FAMILY HISTORY:  Positive for coronary disease.   PAST MEDICAL HISTORY:  Significant for hypertension and hyperlipidemia.  There is no diabetes mellitus.  He does have a history of  hypothyroidism.  He also has a history of nephrolithiasis as well as a  seizure disorder.  He has had prior arthroscopic knee surgery  bilaterally.  He has had previous episodes of vertigo.   REVIEW OF SYSTEMS:  He denies any headaches, fevers, or chills.  There  is no productive cough or hemoptysis.  There is no dysphagia,  odynophagia, melena, or hematochezia.  There is no dysuria or hematuria.  There is no rash or seizure activity.  There is no orthopnea, PND, or  pedal edema.  He does have some pain in his legs at times, which has  been a chronic issue.  Remaining systems are negative.   PHYSICAL EXAMINATION:  VITAL SIGNS:  Today shows a blood pressure of  143/86.  His pulse 77.   His temperature is 97.3.  GENERAL:  He is well-developed and well-nourished, in no acute distress.  SKIN:  Warm and dry.  Does not appear depressed.  There is no peripheral  clubbing.  BACK:  Normal.  HEENT:  Normal with normal eyelids.  NECK:  Supple with a normal upstroke bilaterally.  No bruits noted.  There is no jugular distention and I cannot appreciate thyromegaly.  CHEST:  Clear to auscultation with good expansion.  CARDIOVASCULAR:  Regular rhythm.  Normal S1 and S2.  No murmurs, rubs,  or gallops noted.  ABDOMEN:  Nontender and nondistended.  Positive bowel sounds.  No  hepatosplenomegaly.  No mass appreciated.  There is no abdominal bruit.  He has 2+ femoral pulses bilaterally.  No bruits.  EXTREMITIES:  No edema.  I can palpate no cords.  He has 2+ dorsalis  pedis pulses bilaterally.  NEUROLOGIC:  Grossly intact.   His electrocardiogram shows a sinus rhythm with a left ventricle  hypertrophy.   LABORATORY DATA:  White blood cell count of 12.7 and hemoglobin of 12,  and hematocrit 34.7.  His platelet count is 225.  His sodium is 136 with  potassium of 3.6.  BUN and creatinine were 13 and 1.03.  TSH was normal  at 2.441.  Liver functions have been normal.  A CT of the head from August 22, 2007, showed no acute or reversible findings.  There was extensive  calcification of the vessels at the base of the brain, particularly at  the tibial arteries.   DIAGNOSES:  1. Syncope - etiology of this is unclear to me.  Some of the symptoms      found worrisome for cardiac etiology.  However, he has had a recent      Myoview and we will obtain the final results for the chart.  I will      also schedule him to have an echocardiogram tomorrow morning to      quantify his LV function.  Note, the patient did have a cardiac      monitor and apparently had this at the time of having some of these      dizzy spells.  He has had some of these episodes while watching      TV where he feels  blurred vision.  This is similar to his symptoms  where he has fall, but he did not fall because he was sitting      still.  Regardless, we have a monitor that can correlate his      symptoms with his rhythm.  If there is no significant arrhythmia      associated with these episodes then we can exclude a cardiac      etiology.  The patient should not drive until this is resolved.  2. Hypertension - his blood pressure is diet-controlled.  3. Hyperlipidemia - he will continue on the statin.  4. History of seizures - he will continue with his present medications      and Neurology is following him.  5. Hypothyroidism - he will continue on his present medications.   We will be happy to follow while he is in the hospital.      [name]. Daniel Som, MD, [facility]  Electronically Signed     BSC/MEDQ  D:  10/09/2007  T:  10/10/2007  Job:  [id number]

## 2010-08-04 NOTE — Op Note (Signed)
NAME:  Daniel, [name] NO.:  [id number]   MEDICAL RECORD NO.:  [id number]          PATIENT TYPE:  AMB   LOCATION:  DAY                          FACILITY:  [facility]   PHYSICIAN:  Daniel Park. Daphine Deutscher, MD  DATE OF BIRTH:  Feb 22, 1934   DATE OF PROCEDURE:  04/10/2008  DATE OF DISCHARGE:                               OPERATIVE REPORT   PREOPERATIVE DIAGNOSIS:  Left scrotal hernia.   POSTOPERATIVE DIAGNOSIS:  Left scrotal sliding hernia containing 1 foot  of sigmoid colon with very generous appendices epiploica.   PROCEDURE:  Left inguinal herniorrhaphy with excision of appendices  epiploica to allow the hernia to be reduced, primary repair closing the  sac from the inside, and then installing [employer] hernia mesh system  extended mesh applied (polypropylene).   SURGEON:  Daniel Park. Daphine Deutscher, MD.   ANESTHESIA:  General endotracheal.   DESCRIPTION OF PROCEDURE:  [name] is a 77 year old white  male who was seen in the office on March 06, 2008 with increasing  left inguinal discomfort and a large hernia making it difficult for him  to void.  Informed consent was obtained at that time regarding scrotal  hernia repair with description and complications of hernia repair not  limited to nerve pain, numbness, recurrent testicular issues.  He has  had some issues with MRSA and was asked to scrub with Hibiclens for 2  weeks preop and he was given vancomycin preop.   DESCRIPTION OF PROCEDURE:  After general anesthesia was administered, he  was prepped with a chlorhexidine equivalent and draped sterilely.  He  had a fairly large pannus and he did have retraction of his penis where  it was really not visible from the surface.  We went ahead and made an  oblique incision after marking the skin for subsequent repair of the  incision, and carried this down, and encountered about a large eggplant-  sized scrotal hernia.  I dissected it and mobilized it initially and  brought it up to identify his external ring.  I incised that, opening  that up to the internal ring.  At that point I separated it from the  cord structures, leaving the testicle in the scrotum, and dissected it  away from the blood vessels to the testicle but I opened this and found  a wall.  A part of the wall of this was his sigmoid colon.  Upon  mobilizing this, approximately 1 foot was out and in trying to reduce  this, I could not and I ended up excising multiple appendices epiploica  which were very long and redundant.  So I left it 3 inches long and I  put them in a little blue circular basin and it almost filled up the  entire basin.  Hemostasis was achieved with electrocautery as well as  with 4-0 Vicryl.  I excised these well away from the colon itself.  I  inspected this carefully and no bleeding was noted when I went ahead and  was able to finally after that maneuver get this to be reduced.  Once  reduced, I  held it in place.  I closed the defect, closing the  peritoneum with a running 2-0 Vicryl.   With that now being reduced, I got an extended hernia repair system from  [employer] and placed some horizontal mattress sutures up beneath the sub  fascia, tacking it through and through the portion of the plug that goes  into the internal ring.  It was tacked in 3 areas so that it pulled it  out away from the ring.  It was then cut and sutured around the cord and  sutured down to the external oblique medially and down the pubis  inferiorly and laterally out underneath the external oblique.  The  external oblique was then closed with running 2-0 Vicryl.  I irrigated  it well.  Everything appeared to be in order and I then closed in multiple layers  with 4-0 Vicryl and then with a running subcuticular 5-0 Monocryl,  benzoin, and Steri-Strips.  Because of the extensive nature of this  hernia and the need to excise these appendiceal epiploica, the patient  will be kept for overnight  observation.      Daniel Park Daphine Deutscher, MD  Electronically Signed     MBM/MEDQ  D:  04/10/2008  T:  04/10/2008  Job:  [id number]   cc:   [name], M.D.  Fax: [phone]

## 2010-08-07 NOTE — Discharge Summary (Signed)
NAME:  [name], [name] NO.:  [id number]   MEDICAL RECORD NO.:  [id number]                   PATIENT TYPE:  INP   LOCATION:  2037                                 FACILITY:  [facility]   PHYSICIAN:  Rollene Rotunda, M.D.                DATE OF BIRTH:  07/24/1933   DATE OF ADMISSION:  07/14/2002  DATE OF DISCHARGE:  07/17/2002                                 DISCHARGE SUMMARY   DISCHARGE DIAGNOSES:  1. Status post inferior ST elevation myocardial infarction, treated with     Cypher stenting to the right coronary artery.  2. Coronary artery disease with catheterization on 07/14/02 revealing left     main normal, left anterior descending coronary artery with 30% proximal     stenosis and 40-50% ostial stenosis of a first diagonal, ramus     intermedius normal, circumflex with a 30% ostial first marginal lesion,     right coronary artery with a thrombotic 95% stenosis of the proximal     vessel (successful percutaneous coronary intervention performed of this     lesion, see above number).  Left ventriculogram revealing an ejection     fraction of 70% with mild inferior hypokinesis.  3. Hyperlipidemia, treated.  4. History of a seizure disorder.  5. Hypothyroidism.  6. Degenerative joint disease, status post bilateral knee surgeries.  7. Mild renal insufficiency noted post catheterization, resolved prior to     discharge.   PROCEDURES:  Cardiac catheterization and percutaneous coronary intervention  by Salvadore Farber, M.D., on 07/14/02.   HOSPITAL COURSE:  Please see the dictated note by Dr. Rollene Rotunda on  07/14/02 for complete details.  Briefly, this 77 year old male with no  previously-known coronary artery disease presented to the emergency room  with an inferior ST elevation MI.  He went emergently to the cardiac  catheterization lab and was found to have successful primary percutaneous  coronary intervention of the RCA performed.  He tolerated the  procedure well  and had no immediate complications.  His CK-MB peaked at 71.7.  His  medications were adjusted.  He had previously been on Zocor at home, and  this was discontinued and he was placed on Lipitor 80 mg q.h.s.  A beta  blocker and ACE inhibitor were added to his medical regimen.  On 07/16/02 his  creatinine was noted to be elevated at 1.9.  He was placed on gentle IV  hydration.  On the morning of 07/17/02 his creatinine was improved at 0.8.  He was feeling fine without any complaints of chest pain.  It was felt that  he was stable enough for discharge to home.  He would need close follow-up  with cardiology in the next two weeks.   LABORATORY DATA:  Admission chest x-ray:  No evidence of acute  cardiopulmonary disease.   White count 7100, hemoglobin 11.9, hematocrit 35, platelet count 178,000.  Sodium 138,  potassium 3.8, chloride 106, CO2 29, glucose 109, BUN 9,  creatinine 0.8, calcium 8.6.  Total bilirubin 0.7, alkaline  phosphatase 58,  AST 21, ALT 21, total protein 5.7, albumin 3.5.  Hemoglobin A1C 6.2.  Total  cholesterol 210, triglycerides 56, HDL 76, LDL 123.   No pending tests at the time of this dictation.   DISCHARGE MEDICATIONS:  1. Plavix 75 mg daily (to be taken for at least nine months post     intervention).  2. Coated aspirin 325 mg daily (to be taken indefinitely).  3. Synthroid 0.025 mg daily.  4. Carbatrol.  5. Altace 5 mg daily.  6. Toprol XL 100 mg daily.  7. Lipitor 80 mg q.h.s.  8. Nitroglycerin p.r.n. chest pain.   DISCHARGE INSTRUCTIONS:  1. He was asked to call our office of 911 with recurrent chest pain.  2. Activity:  No driving for one week, no heavy lifting, work, or exertion     for two weeks.  He was to slowly advance as tolerated.  He should     continue with cardiac rehab.  3. Diet:  Low-fat, low-salt.  4. Wound care:  The patient should call our office in [facility] for any     groin swelling, bleeding, or bruising.   FOLLOW-UP:   With the physician assistant for Dr. Antoine Poche on Thursday,  08/02/02, at 10:30 a.m.  The patient should follow up with his primary care  physician, Dr. Christell Constant, as needed.  The patient will need follow-up LFTs and  lipid panel within the next eight to 12 weeks given the change in his statin  therapy.      Tereso Newcomer, P.A.                        Rollene Rotunda, M.D.    SW/MEDQ  D:  07/17/2002  T:  07/17/2002  Job:  [id number]   cc:   Ernestina Penna, M.D.  [address] [place]  [place] [address]  Fax: [phone]

## 2010-08-07 NOTE — H&P (Signed)
NAME:  [name], [name] NO.:  [id number]   MEDICAL RECORD NO.:  [id number]                   PATIENT TYPE:  INP   LOCATION:  1824                                 FACILITY:  [facility]   PHYSICIAN:  Rollene Rotunda, M.D.                DATE OF BIRTH:  30-May-1933   DATE OF ADMISSION:  07/14/2002  DATE OF DISCHARGE:                                HISTORY & PHYSICAL   PRIMARY CARE PHYSICIAN:  Ernestina Penna, M.D.   REASON FOR PRESENTATION:  Exacerbation of chest pain.   HISTORY OF PRESENT ILLNESS:  The patient is a pleasant 77 year old gentleman  with no prior cardiac history.  He reports chest discomfort off and on with  walking over the past week.  He had an episode of it on Thursday while he  was golfing.  He awoke this morning at 5 a.m. with 10 out of 10 chest  discomfort.  It was substernal.  It was a squeezing.  There was no  radiation.  There was no nausea, vomiting, no shortness of breath.  He  presented to the emergency room and was initially pain free.  However, the  pain recurred with ST segment elevation of 3 mm in 2, 3, and aVF.  He had 1  mm ST segment elevation in V3-V5.   PAST MEDICAL HISTORY:  Hyperlipidemia, hypothyroidism recently started on  Synthroid,  seizure disorder. There is no history of hypertension or  diabetes.   PAST SURGICAL HISTORY:  Bilateral knee surgeries.   ALLERGIES:  None.   MEDICATIONS:  Seizure medicine, Synthroid, Zocor (dose not clear, the  patient did not bring his list with him).   SOCIAL HISTORY:  The patient is married.  He quit smoking at age 68.  He has  four children.  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   FAMILY HISTORY:  Noncontributory for early coronary artery disease.  His  father had an MI at age 75.   REVIEW OF SYSTEMS:  As stated in the HPI, negative for all other systems.   PHYSICAL EXAMINATION:  GENERAL: The patient is in no distress.  VITAL SIGNS: Blood pressure 120/70,  heart rate 80 and regular.  HEENT: Eyes unremarkable, pupils are equal, round and reactive to light.  Fundi not visualized.  Oral mucosa unremarkable.  NECK: No jugular venous distension, wave forms within normal limits, carotid  upstrokes brisk and symmetric, there are no bruits, no thyromegaly.  LYMPHATICS: No cervical, axillary or inguinal adenopathy.  LUNGS: Clear to auscultation bilaterally.  BACK: No costovertebral angle tenderness.  CHEST: Unremarkable.  HEART: The PMI nondisplaced, nonsustained, S1 and S2 within normal limits,  no S3, no S4, no murmurs.  ABDOMEN: Obese, positive bowel sounds, no organomegaly, no bruits, rebound,  guarding, no hepatosplenomegaly.  SKIN: No rash, no nodules.  EXTREMITIES: Pulses 2+ throughout.  No edema, cyanosis, clubbing.  NEUROLOGIC: Oriented to person, place  and time.  Cranial nerves II-XII  grossly intact, motor grossly without deficits.  EKG shows sinus rhythm, axis within normal limits, intervals within normal  limits.  Acute 3 mm ST segment elevation in 2, 3 and aVF, 1 mm ST segment  elevation in V2-V4.   LABORATORY DATA:  Hemoglobin 13.4, WBC 5900, platelets 179,000.  Sodium 239,  potassium 3.6, chloride 106, BUN 21, creatinine 1.0, CK 169.  Chest x-ray  pending.   ASSESSMENT AND PLAN:  1. Acute inferior myocardial infarction.  The patient is treated with     heparin, nitrates and aspirin.  He will have an urgent cardiac     catheterization.  2. Risk reduction - will check a lipid profile.  He will continue on Zocor     with dose adjustment as needed.  3. Hypothyroidism.  Will get his home dose of Synthroid.  His TSH would not     be reflective of this drug which was recently started.  4. Seizure disorder, will continue him on his home regimen.                                               Rollene Rotunda, M.D.    JH/MEDQ  D:  07/14/2002  T:  07/16/2002  Job:  [id number]   cc:   Ernestina Penna, M.D.  [address] [place]  [place] [address]  Fax: [phone]

## 2010-08-07 NOTE — Discharge Summary (Signed)
   NAME:  PRANEETH, BUSSEY NO.:  [id number]   MEDICAL RECORD NO.:  [id number]                   PATIENT TYPE:  INP   LOCATION:  2037                                 FACILITY:  [facility]   PHYSICIAN:  Rollene Rotunda, M.D.                DATE OF BIRTH:  Jan 05, 1934   DATE OF ADMISSION:  07/14/2002  DATE OF DISCHARGE:  07/17/2002                                 DISCHARGE SUMMARY   ADDENDUM:  The patient's pharmacist contacted me after the patient left the  hospital.  The pharmacist raised a question of a possible interaction  between high-dose Lipitor and Plavix.  His concern was for interference from  the Lipitor and activity of the Plavix.  He noted that Pravachol did not  seem to display the same affect.  I asked him to change the patient's  Lipitor to Pravachol 80 mg daily.  The study that the pharmacist was quoting  was faxed to me.  Apparently this unnamed study looked at 35 patients  receiving concurrent Plavix and Lipitor.  There was a nonspecifically  significant change in the platelet aggregation inhibition from Plavix when  coupled with Lipitor therapy.  There was also a followup study of 75  patients that showed no significant change.  This issue will be discussed  with the interventionalists.  For now, the patient is still on Pravachol 80  mg daily.  This can be revisited at followup.  If needed, it seems that  changing back to Lipitor would not be out of the question.     Tereso Newcomer, P.A.                        Rollene Rotunda, M.D.    SW/MEDQ  D:  07/17/2002  T:  07/17/2002  Job:  [id number]   cc:   Ernestina Penna, M.D.  [address] [place]  [place] [address]  Fax: [phone]

## 2010-08-07 NOTE — Cardiovascular Report (Signed)
NAME:  [name], [name] NO.:  [id number]   MEDICAL RECORD NO.:  [id number]                   PATIENT TYPE:  INP   LOCATION:  2037                                 FACILITY:  [facility]   PHYSICIAN:  Salvadore Farber, M.D.             DATE OF BIRTH:  [birth year]   DATE OF PROCEDURE:  07/14/2002  DATE OF DISCHARGE:                              CARDIAC CATHETERIZATION   PROCEDURES PERFORMED:  1. Coronary angiography.  2. Stent to the right coronary artery.  3. Left heart catheterization.  4. Left ventriculography.   CARDIOLOGIST:  Salvadore Farber, M.D.   INDICATIONS FOR PROCEDURE:  The patient is a 77 year old gentleman without  prior history of cardiovascular disease who presents with inferior  myocardial infarction.  He has had chest discomfort on and off over the past  week.  He awoke this morning at 5 AM with 10/10 chest discomfort.  The pain  resolved spontaneously.  He presented to the emergency room pain-free;  however, in the emergency room he developed recurrent pain with 3 mm ST  elevations in the anterior leads.  Pain and EKG abnormalities resolved with  initiation of heparin and the administration of nitroglycerin.  He was then  brought urgently to the cardiac catheterization suite.   PROCEDURAL TECHNIQUE:  Informed consent was obtained.  Under 1% lidocaine  local anesthesia a 7 French sheath was placed in the right femoral artery  using the modified Seldinger technique.  Diagnostic angiography and  ventriculography were performed using JL4, JR4 and pigtail catheters.  The  case then proceeded to intervention.   In the emergency department the patient was administered 5,000 units of  heparin.  Upon arrival in the catheterization suite the ACT was 239 seconds.  Upon decision to intervene ReoPro was administered and 300 mg of Plavix was  administered.  ACT was subsequently confirmed to remain greater than 225  seconds.  A 7 [place] guide  was advanced over a wire and engaged the  ostium of the right coronary artery.  Luge wire was advanced without  difficulty beyond the lesion into the distal RCA.  The lesion was then  predilated using a 3.0 x 12 mm Maverick at 12 atmospheres.  This performed  to facilitate visualization of the vessel for stent placement.  A 3.5 x 18  mm CYPHER stent was then positioned across the lesion and deployed at 14  atmospheres.  The distal portion of the stent was post dilated using a 3.5 x  15 mm PowerSail at 14 atmospheres.  This same balloon was used to post  dilate the proximal portion of the stent at 16 atmospheres.  Final  angiograms demonstrated no residual stenosis and TIMI III flow.  There was  no dissection   The patient tolerated the procedure well and was transferred to the holding  room in stable condition.   COMPLICATIONS:  None.   FINDINGS:  1. LV 147/10/14.  EF 70% with mild inferior hypokinesis.   1. Left Main:  Short vessel, which is angiographically normal.   1. LAD:  The LAD is a moderate size vessel giving rise to a single diagonal     branch.  There is a 30% stenosis of the proximal vessel at the site of     the takeoff of the diagonal.  This diagonal has approximately 40-50%     ostial stenosis.   1. Ramus Intermedius:  Small vessel, which is angiographically normal.   1. Circumflex:  The circumflex is a large vessel giving rise to two large     obtuse marginal branches.  The first marginal has a 30% ostial stenosis.   1. RCA:  The RCA is a very large vessel giving rise to a large PDA and large     posterior left ventricular branch.  There is a thrombotic 95% stenosis of     the proximal vessel with associated TIMI II flow.  This was successfully     stented resulting in no residual stenosis and TIMI III flow.   1. No aortic stenosis or mitral regurgitation.   IMPRESSION/RECOMMENDATIONS:  Successful percutaneous intervention of the  culprit lesion in the right  coronary artery resulting in no residual  stenosis and TIMI III flow to the distal vessel.   The patient will be continued on aspirin and statin.  Beta blocker will be  initiated.  Plavix should be continued for nine months given his acute  presentation.  ACE inhibitor will be initiated as long as his blood pressure  allows.  Hemoglobin A-1-C will checked given the elevated glucose  demonstrated in the laboratory studies obtained in the emergency room.  He  will be admitted to the cardiac intensive care unit.                                                 Salvadore Farber, M.D.    WED/MEDQ  D:  07/14/2002  T:  07/16/2002  Job:  [id number]   cc:   Ernestina Penna, M.D.  [address] [place]  [place] [address]  Fax: [phone]   Rollene Rotunda, M.D.

## 2010-12-17 LAB — POCT I-STAT, CHEM 8
BUN: 25 — ABNORMAL HIGH
Calcium, Ion: 1.05 — ABNORMAL LOW
Chloride: 102
Creatinine, Ser: 1.1
Glucose, Bld: 110 — ABNORMAL HIGH
HCT: 40
Hemoglobin: 13.6
Potassium: 3.9
Sodium: 134 — ABNORMAL LOW
TCO2: 23

## 2010-12-17 LAB — DIFFERENTIAL
Basophils Absolute: 0
Basophils Relative: 0
Eosinophils Absolute: 0.2
Eosinophils Relative: 1
Lymphocytes Relative: 9 — ABNORMAL LOW
Lymphs Abs: 1.4
Monocytes Absolute: 0.9
Monocytes Relative: 6
Neutro Abs: 12.4 — ABNORMAL HIGH
Neutrophils Relative %: 84 — ABNORMAL HIGH

## 2010-12-17 LAB — POCT CARDIAC MARKERS
CKMB, poc: 1.5
Myoglobin, poc: 76.9
Operator id: 272551
Troponin i, poc: <0.05

## 2010-12-17 LAB — CBC
HCT: 38.5 — ABNORMAL LOW
Hemoglobin: 13.5
MCHC: 35
MCV: 91.1
Platelets: 225
RBC: 4.23
RDW: 14.3
WBC: 14.7 — ABNORMAL HIGH

## 2010-12-18 LAB — BASIC METABOLIC PANEL
BUN: 13
BUN: 13
CO2: 26
CO2: 28
Calcium: 8.6
Calcium: 8.7
Chloride: 100
Chloride: 101
Creatinine, Ser: 0.98
Creatinine, Ser: 1.03
GFR calc Af Amer: >60
GFR calc Af Amer: >60
GFR calc non Af Amer: >60
GFR calc non Af Amer: >60
Glucose, Bld: 130 — ABNORMAL HIGH
Glucose, Bld: 138 — ABNORMAL HIGH
Potassium: 3.6
Potassium: 3.6
Sodium: 136
Sodium: 136

## 2010-12-18 LAB — WOUND CULTURE: Gram Stain: NONE SEEN

## 2010-12-18 LAB — COMPREHENSIVE METABOLIC PANEL
ALT: 16
AST: 19
Albumin: 3.4 — ABNORMAL LOW
Alkaline Phosphatase: 65
BUN: 12
CO2: 29
Calcium: 8.9
Chloride: 99
Creatinine, Ser: 0.94
GFR calc Af Amer: >60
GFR calc non Af Amer: >60
Glucose, Bld: 142 — ABNORMAL HIGH
Potassium: 3.8
Sodium: 138
Total Bilirubin: 0.7
Total Protein: 6.1

## 2010-12-18 LAB — CULTURE, BLOOD (ROUTINE X 2)
Culture: NO GROWTH
Culture: NO GROWTH

## 2010-12-18 LAB — CBC
HCT: 34.7 — ABNORMAL LOW
HCT: 36.9 — ABNORMAL LOW
HCT: 37.2 — ABNORMAL LOW
HCT: 38.3 — ABNORMAL LOW
HCT: 39.3
Hemoglobin: 12 — ABNORMAL LOW
Hemoglobin: 13
Hemoglobin: 13
Hemoglobin: 13.1
Hemoglobin: 13.7
MCHC: 33.9
MCHC: 34.5
MCHC: 34.9
MCHC: 35.2
MCHC: 35.2
MCV: 89.4
MCV: 89.6
MCV: 89.7
MCV: 90.4
MCV: 90.4
Platelets: 206
Platelets: 215
Platelets: 223
Platelets: 225
Platelets: 226
RBC: 3.87 — ABNORMAL LOW
RBC: 4.11 — ABNORMAL LOW
RBC: 4.17 — ABNORMAL LOW
RBC: 4.24
RBC: 4.35
RDW: 13.8
RDW: 13.8
RDW: 13.9
RDW: 13.9
RDW: 13.9
WBC: 12.3 — ABNORMAL HIGH
WBC: 12.7 — ABNORMAL HIGH
WBC: 12.9 — ABNORMAL HIGH
WBC: 14.7 — ABNORMAL HIGH
WBC: 14.8 — ABNORMAL HIGH

## 2010-12-18 LAB — LIPID PANEL
Cholesterol: 158
HDL: 57
LDL Cholesterol: 85
Total CHOL/HDL Ratio: 2.8
Triglycerides: 79
VLDL: 16

## 2010-12-18 LAB — URINALYSIS, ROUTINE W REFLEX MICROSCOPIC
Bilirubin Urine: NEGATIVE
Glucose, UA: NEGATIVE
Ketones, ur: NEGATIVE
Nitrite: NEGATIVE
Protein, ur: NEGATIVE
Specific Gravity, Urine: 1.011
Urobilinogen, UA: 0.2
pH: 6.5

## 2010-12-18 LAB — DIFFERENTIAL
Basophils Absolute: 0
Basophils Absolute: 0
Basophils Relative: 0
Basophils Relative: 0
Eosinophils Absolute: 0.2
Eosinophils Absolute: 0.4
Eosinophils Relative: 1
Eosinophils Relative: 3
Lymphocytes Relative: 11 — ABNORMAL LOW
Lymphocytes Relative: 19
Lymphs Abs: 1.6
Lymphs Abs: 2.4
Monocytes Absolute: 1.2 — ABNORMAL HIGH
Monocytes Absolute: 1.5 — ABNORMAL HIGH
Monocytes Relative: 10
Monocytes Relative: 9
Neutro Abs: 11.4 — ABNORMAL HIGH
Neutro Abs: 8.8 — ABNORMAL HIGH
Neutrophils Relative %: 69
Neutrophils Relative %: 78 — ABNORMAL HIGH

## 2010-12-18 LAB — URINE MICROSCOPIC-ADD ON

## 2010-12-18 LAB — VANCOMYCIN, TROUGH: Vancomycin Tr: 11.3

## 2010-12-18 LAB — POCT I-STAT, CHEM 8
BUN: 15
Calcium, Ion: 1.15
Chloride: 100
Creatinine, Ser: 1.3
Glucose, Bld: 115 — ABNORMAL HIGH
HCT: 39
Hemoglobin: 13.3
Potassium: 4.1
Sodium: 136
TCO2: 31

## 2010-12-18 LAB — APTT: aPTT: 30

## 2010-12-18 LAB — MAGNESIUM: Magnesium: 2.4

## 2010-12-18 LAB — PHOSPHORUS: Phosphorus: 3.7

## 2010-12-18 LAB — PROTIME-INR
INR: 1
Prothrombin Time: 13.1

## 2010-12-18 LAB — TSH: TSH: 2.441

## 2010-12-18 LAB — CARBAMAZEPINE LEVEL, TOTAL: Carbamazepine Lvl: 12.7 — ABNORMAL HIGH

## 2011-03-14 ENCOUNTER — Emergency Department (HOSPITAL_COMMUNITY)
Admission: EM | Admit: 2011-03-14 | Discharge: 2011-03-14 | Disposition: A | Discharge status: Home / Self Care | Payer: Medicare Other | Attending: Family Medicine | Admitting: Family Medicine

## 2011-03-14 DIAGNOSIS — R040 Epistaxis: Secondary | ICD-10-CM | POA: Insufficient documentation

## 2011-03-14 MED ORDER — OXYMETAZOLINE HCL 0.05 % NA SOLN
1.0000 | Freq: Once | NASAL | Status: AC
  Administered 2011-03-14: 1 via NASAL
  Filled 2011-03-14 (×2): qty 15

## 2011-03-14 MED ORDER — AZITHROMYCIN 250 MG PO TABS: 250.0000 mg | ORAL_TABLET | Freq: Every day | ORAL | Status: AC

## 2011-03-14 MED ORDER — SILVER NITRATE-POT NITRATE 75-25 % EX MISC
1.0000 | Freq: Once | CUTANEOUS | Status: DC
  Filled 2011-03-14 (×2): qty 1

## 2011-03-14 NOTE — ED Provider Notes (Signed)
History     CSN: [id number]  Arrival date & time 03/14/11  1324   First MD Initiated Contact with Patient 03/14/11 [phone]      Chief Complaint  Patient presents with  . Epistaxis    (Consider location/radiation/quality/duration/timing/severity/associated sxs/prior treatment) HPI Comments: Patient presents with chief complaint epistaxis.  It started this morning after blowing his nose.  Patient reports that he was having nosebleeds last week as well.  He is unable to stop the bleeding with pressure.  Patient denies being on any blood thinners besides aspirin.  Patient denies being lightheaded having syncopal episode episodes or other complaints.  Patient is a 77 y.o. male presenting with nosebleeds. The history is provided by the patient.  Epistaxis  This is a new problem. The current episode started 1 to 2 hours ago. The problem occurs constantly. The problem has not changed since onset.The problem is associated with aspirin (nose blowing ). The bleeding has been from the left nare. He has tried applying pressure for the symptoms. The treatment provided no relief. His past medical history is significant for frequent nosebleeds.    No past medical history on file.  No past surgical history on file.  No family history on file.  History  Substance Use Topics  . Smoking status: Not on file  . Smokeless tobacco: Not on file  . Alcohol Use: Not on file      Review of Systems  Constitutional: Negative for fever, chills and appetite change.  HENT: Positive for nosebleeds and congestion. Negative for rhinorrhea.   Eyes: Negative for visual disturbance.  Respiratory: Negative for shortness of breath.   Cardiovascular: Negative for chest pain and leg swelling.  Gastrointestinal: Negative for abdominal pain.  Genitourinary: Negative for dysuria, urgency and frequency.  Skin: Negative for color change and pallor.  Neurological: Negative for dizziness, syncope, weakness,  light-headedness, numbness and headaches.  Psychiatric/Behavioral: Negative for confusion.  All other systems reviewed and are negative.    Allergies  Review of patient's allergies indicates not on file.  Home Medications   Current Outpatient Rx  Name Route Sig Dispense Refill  . ACETAMINOPHEN 500 MG PO TABS Oral Take 500 mg by mouth every 6 (six) hours as needed. For stuffy head.     . ASPIRIN EC 81 MG PO TBEC Oral Take 81 mg by mouth every morning.      . ATORVASTATIN CALCIUM 80 MG PO TABS Oral Take 80 mg by mouth at bedtime.      Marland Kitchen CARBAMAZEPINE ER 300 MG PO CP12 Oral Take 300 mg by mouth 2 (two) times daily.      Marland Kitchen CLONIDINE HCL 0.1 MG PO TABS Oral Take 0.1 mg by mouth 2 (two) times daily.      Marland Kitchen DIAZEPAM 2 MG PO TABS Oral Take 2 mg by mouth 2 (two) times daily.      Marland Kitchen HYDROCHLOROTHIAZIDE 12.5 MG PO CAPS Oral Take 12.5 mg by mouth every morning.      Marland Kitchen LEVOTHYROXINE SODIUM 25 MCG PO TABS Oral Take 25 mcg by mouth every morning.      Marland Kitchen LOSARTAN POTASSIUM 50 MG PO TABS Oral Take 50 mg by mouth every morning.      Marland Kitchen METOPROLOL TARTRATE 50 MG PO TABS Oral Take 50 mg by mouth 2 (two) times daily.      Carma Leaven M PLUS PO TABS Oral Take 1 tablet by mouth at bedtime.      Marland Kitchen SILODOSIN 8 MG PO  CAPS Oral Take 8 mg by mouth at bedtime.      . AZITHROMYCIN 250 MG PO TABS Oral Take 1 tablet (250 mg total) by mouth daily. 6 tablet 0    BP 164/89  Pulse 74  Temp(Src) 97.3 F (36.3 C) (Oral)  Resp 18  SpO2 99%  Physical Exam  Nursing note and vitals reviewed. Constitutional: He is oriented to person, place, and time. He appears well-developed and well-nourished. No distress.  HENT:  Head: Normocephalic and atraumatic.  Nose: No mucosal edema, nose lacerations, nasal deformity, septal deviation or nasal septal hematoma. Epistaxis (large clot pulled from left nare. Able to visualize area of bleed. Cauterized with silver nitrate ) is observed.  Mouth/Throat: Uvula is midline, oropharynx is  clear and moist and mucous membranes are normal.  Eyes:       Normal appearance  Neck: Normal range of motion.  Cardiovascular: Normal rate, normal heart sounds and intact distal pulses.   Neurological: He is alert and oriented to person, place, and time.  Psychiatric: He has a normal mood and affect. His behavior is normal.    ED Course  Procedures (including critical care time)  Labs Reviewed - No data to display No results found.   1. Epistaxis    Controlled with silver nitrate and aspirin.  Patient instructed to followup with ENT if nosebleeds persist    MDM  Epistaxis         Jaci Carrel, [place] 03/14/11 1636

## 2011-03-14 NOTE — Discharge Instructions (Signed)
Using number above to call your ENT for followup in regards to your nose bleed.  Read below on more information for nose bleed.  As we discussed a few continue to have nosebleeds use the methods such as squeezing for 5-20 minutes leaning forward.  It may also help if you use ice on that area.  Aspirin also can help stop the bleeding is put on some gauze.  Nosebleed Nosebleeds can be caused by many conditions including trauma, infections, polyps, foreign bodies, dry mucous membranes or climate, medications and air conditioning. Most nosebleeds occur in the front of the nose. It is because of this location that most nosebleeds can be controlled by pinching the nostrils gently and continuously. Do this for at least 10 to 20 minutes. The reason for this long continuous pressure is that you must hold it long enough for the blood to clot. If during that 10 to 20 minute time period, pressure is released, the process may have to be started again. The nosebleed may stop by itself, quit with pressure, need concentrated heating (cautery) or stop with pressure from packing. HOME CARE INSTRUCTIONS   If your nose was packed, try to maintain the pack inside until your caregiver removes it. If a gauze pack was used and it starts to fall out, gently replace or cut the end off. Do not cut if a balloon catheter was used to pack the nose. Otherwise, do not remove unless instructed.   Avoid blowing your nose for 12 hours after treatment. This could dislodge the pack or clot and start bleeding again.   If the bleeding starts again, sit up and bending forward, gently pinch the front half of your nose continuously for 20 minutes.   If bleeding was caused by dry mucous membranes, cover the inside of your nose every morning with a petroleum or antibiotic ointment. Use your little fingertip as an applicator. Do this as needed during dry weather. This will keep the mucous membranes moist and allow them to heal.   Maintain  humidity in your home by using less air conditioning or using a humidifier.   Do not use aspirin or medications which make bleeding more likely. Your caregiver can give you recommendations on this.   Resume normal activities as able but try to avoid straining, lifting or bending at the waist for several days.   If the nosebleeds become recurrent and the cause is unknown, your caregiver may suggest laboratory tests.  SEEK IMMEDIATE MEDICAL CARE IF:   Bleeding recurs and cannot be controlled.   There is unusual bleeding from or bruising on other parts of the body.   You have a fever.   Nosebleeds continue.   There is any worsening of the condition which originally brought you in.   You become lightheaded, feel faint, become sweaty or vomit blood.  MAKE SURE YOU:   Understand these instructions.   Will watch your condition.   Will get help right away if you are not doing well or get worse.  Document Released: 12/16/2004 Document Revised: 11/18/2010 Document Reviewed: 02/07/2009 [facility] Patient Information 2012 [place], [place].

## 2011-03-14 NOTE — ED Notes (Signed)
Pt complains of a nosebleed that started at 730am, had one three weeks ago from a scratch, he states that hes had a head cold and blew his nose this am prior to the bleeding, bleeding controlled at this time

## 2011-03-14 NOTE — ED Notes (Signed)
Pt nose started bleeding again. Nurse gave new bandages and brought a bag of ice, but pts bleeding had stopped by the time RN returned with ice. Pt reports feeling like blood was going down the back of his throat but upon rn assessment with light and tongue depressor there was no evidence of blood in back of throat. But will notify doctor.

## 2011-03-14 NOTE — ED Provider Notes (Signed)
Medical screening examination/treatment/procedure(s) were performed by non-physician practitioner and as supervising physician I was immediately available for consultation/collaboration.    Jon R Knapp, MD 03/14/11 1723 

## 2011-03-26 DIAGNOSIS — B351 Tinea unguium: Secondary | ICD-10-CM | POA: Diagnosis not present

## 2011-03-26 DIAGNOSIS — L570 Actinic keratosis: Secondary | ICD-10-CM | POA: Diagnosis not present

## 2011-05-12 DIAGNOSIS — J069 Acute upper respiratory infection, unspecified: Secondary | ICD-10-CM | POA: Diagnosis not present

## 2011-05-12 DIAGNOSIS — R072 Precordial pain: Secondary | ICD-10-CM | POA: Diagnosis not present

## 2011-05-13 DIAGNOSIS — R7989 Other specified abnormal findings of blood chemistry: Secondary | ICD-10-CM | POA: Diagnosis not present

## 2011-05-13 DIAGNOSIS — E785 Hyperlipidemia, unspecified: Secondary | ICD-10-CM | POA: Diagnosis not present

## 2011-05-13 DIAGNOSIS — I10 Essential (primary) hypertension: Secondary | ICD-10-CM | POA: Diagnosis not present

## 2011-05-13 DIAGNOSIS — E039 Hypothyroidism, unspecified: Secondary | ICD-10-CM | POA: Diagnosis not present

## 2011-05-13 DIAGNOSIS — I259 Chronic ischemic heart disease, unspecified: Secondary | ICD-10-CM | POA: Diagnosis not present

## 2011-05-20 ENCOUNTER — Encounter: Payer: Self-pay | Admitting: *Deleted

## 2011-05-20 ENCOUNTER — Ambulatory Visit (INDEPENDENT_AMBULATORY_CARE_PROVIDER_SITE_OTHER): Payer: Medicare Other | Admitting: Physician Assistant

## 2011-05-20 ENCOUNTER — Encounter: Payer: Self-pay | Admitting: Physician Assistant

## 2011-05-20 DIAGNOSIS — I2510 Atherosclerotic heart disease of native coronary artery without angina pectoris: Secondary | ICD-10-CM | POA: Diagnosis not present

## 2011-05-20 DIAGNOSIS — E785 Hyperlipidemia, unspecified: Secondary | ICD-10-CM

## 2011-05-20 DIAGNOSIS — R079 Chest pain, unspecified: Secondary | ICD-10-CM | POA: Insufficient documentation

## 2011-05-20 DIAGNOSIS — E119 Type 2 diabetes mellitus without complications: Secondary | ICD-10-CM | POA: Diagnosis not present

## 2011-05-20 DIAGNOSIS — I10 Essential (primary) hypertension: Secondary | ICD-10-CM | POA: Insufficient documentation

## 2011-05-20 NOTE — Assessment & Plan Note (Signed)
Managed by PCP

## 2011-05-20 NOTE — Progress Notes (Signed)
[address] [place], [place]  [address] Phone: [phone] Fax:  [phone]  Date:  05/20/2011   Name:  [name]       DOB:  31-Aug-1933 MRN:  [id number]  PCP:  Dr. Tanya Nones Primary Cardiologist:  Dr. Rollene Rotunda  Primary Electrophysiologist:  None    History of Present Illness: [name] is a 78 y.o. male who presents for evaluation of chest pain.  He was last seen by Dr. Antoine Poche 9/09.  He has a history of CAD, status post MI treated with Cypher DES to the pRCA in 4/04, hyperlipidemia, hypothyroidism, seizure disorder, arthritis.  LHC 4/04 in the setting of his MI: Proximal LAD 30%, ostial diagonal 40-50%, ostial OM1 30%, proximal RCA 95% which was the infarct vessel.  Echocardiogram 7/09: Normal LV function.  Nuclear study 2009: EF 68%, no ischemia or infarct.  He was evaluated in 7/09 for near syncope.  Neurologic workup was pursued and angiogram was negative for vertebral artery stenosis.  Event monitor was unrevealing.  His symptoms resolved.  He was referred to ENT at that time.  He was then dx with Meniere's disease by Dr. Suszanne Conners.  He was seen at his PCPs office 05/12/11 with URI type symptoms.  He complained of chest pain and was referred back for follow up.  The patient notes recent symptoms of nausea and vomiting.  This lasted for about 24 hours.  He had soreness across both shoulders and his upper chest after this.  His symptoms have resolved.  Otherwise, he denies exertional chest pain or heaviness.  He denies significant dyspnea with exertion.  He denies orthopnea, PND or edema.  He denies syncope.  Past Medical History  Diagnosis Date  . Coronary artery disease     s/p MI tx with Cypher DES to pRCA in 4/04; LHC 4/04 in the setting of his MI: Proximal LAD 30%, ostial diagonal 40-50%, ostial OM1 30%, proximal RCA 95% which was the infarct vessel   ;    Echocardiogram 7/09: Normal LV function.  Nuclear study 2009: EF 68%, no ischemia or  infarct.     . Dyslipidemia   . Seizure disorder   . Hypothyroidism   . DDD (degenerative disc disease)   . Hx MRSA infection     left buttocks abscess  . Rotator cuff injury     chronic rotator cuff injury status post repair  . Hypertension   . Syncope   . Dizziness   . Meniere's disease     Status post shunt  . BPH (benign prostatic hyperplasia)     Current Outpatient Prescriptions  Medication Sig Dispense Refill  . acetaminophen (TYLENOL) 500 MG tablet Take 500 mg by mouth every 6 (six) hours as needed. For stuffy head.       . aspirin EC 81 MG tablet Take 81 mg by mouth every morning.        Marland Kitchen atorvastatin (LIPITOR) 80 MG tablet Take 80 mg by mouth at bedtime.        . carbamazepine (CARBATROL) 300 MG 12 hr capsule Take 300 mg by mouth 2 (two) times daily.        . cloNIDine (CATAPRES) 0.1 MG tablet Take 0.1 mg by mouth 2 (two) times daily.        . diazepam (VALIUM) 2 MG tablet Take 2 mg by mouth 2 (two) times daily.        . fluticasone (FLONASE) 50 MCG/ACT nasal spray Place  1 spray into the nose as needed.       . hydrochlorothiazide (MICROZIDE) 12.5 MG capsule Take 12.5 mg by mouth every morning.        Marland Kitchen levothyroxine (SYNTHROID, LEVOTHROID) 25 MCG tablet Take 25 mcg by mouth 2 (two) times daily.       Marland Kitchen losartan (COZAAR) 50 MG tablet Take 50 mg by mouth every morning.        . metoprolol (LOPRESSOR) 50 MG tablet Take 50 mg by mouth 2 (two) times daily.        . Multiple Vitamins-Minerals (MULTIVITAMINS THER. W/MINERALS) TABS Take 1 tablet by mouth at bedtime.        . silodosin (RAPAFLO) 8 MG CAPS capsule Take 8 mg by mouth at bedtime.          Allergies: No Known Allergies  History  Substance Use Topics  . Smoking status: Former Games developer  . Smokeless tobacco: Not on file  . Alcohol Use: No     Family History  Problem Relation Age of Onset  . Heart attack Father 64  . Aneurysm Mother     brain aneurysm  . Coronary artery disease Brother     with CABG      ROS:  Please see the history of present illness.   All other systems reviewed and negative.   PHYSICAL EXAM: VS:  BP 142/90  Pulse 66  Ht 5\' 8"  (1.727 m)  Wt 233 lb (105.688 kg)  BMI 35.43 kg/m2 Well nourished, well developed, in no acute distress HEENT: normal Neck: no JVD Vascular: No carotid bruits Cardiac:  normal S1, S2; RRR; no murmur Lungs:  clear to auscultation bilaterally, no wheezing, rhonchi or rales Abd: soft, nontender, no hepatomegaly Ext: no edema Skin: warm and dry Neuro:  CNs 2-12 intact, no focal abnormalities noted  EKG:  Sinus rhythm, heart rate 66, left axis deviation, nonspecific ST-T wave changes, no old tracing to compare  ASSESSMENT AND PLAN:

## 2011-05-20 NOTE — Assessment & Plan Note (Signed)
He has not had an ischemic evaluation in over 3 years.  Her range ETT-Myoview.  Of note, he has significant DJD.  He will likely have to be changed to a [employer].  Follow up with Dr. Rollene Rotunda in 3 mos or sooner if his nuclear study is abnormal.

## 2011-05-20 NOTE — Patient Instructions (Signed)
Your physician recommends that you schedule a follow-up appointment in: 3 months with Dr Antoine Poche Your physician has requested that you have en exercise stress myoview. For further information please visit [email or web address] Please follow instruction sheet, as given.

## 2011-05-20 NOTE — Assessment & Plan Note (Signed)
Atypical.  This is likely related to retching from his recent gastroenteritis.

## 2011-05-20 NOTE — Assessment & Plan Note (Signed)
Elevated today.  He notes it is typically 120/80 at his PCPs office.

## 2011-06-02 ENCOUNTER — Ambulatory Visit (HOSPITAL_COMMUNITY): Payer: Medicare Other | Attending: Internal Medicine | Admitting: Radiology

## 2011-06-02 VITALS — BP 172/93 | Ht 68.0 in | Wt 231.0 lb

## 2011-06-02 DIAGNOSIS — R079 Chest pain, unspecified: Secondary | ICD-10-CM

## 2011-06-02 DIAGNOSIS — I2510 Atherosclerotic heart disease of native coronary artery without angina pectoris: Secondary | ICD-10-CM | POA: Insufficient documentation

## 2011-06-02 MED ORDER — TECHNETIUM TC 99M TETROFOSMIN IV KIT
33.0000 | PACK | Freq: Once | INTRAVENOUS | Status: AC | PRN
  Administered 2011-06-02: 33 via INTRAVENOUS

## 2011-06-02 MED ORDER — REGADENOSON 0.4 MG/5ML IV SOLN
0.4000 mg | Freq: Once | INTRAVENOUS | Status: AC
  Administered 2011-06-02: 0.4 mg via INTRAVENOUS

## 2011-06-02 MED ORDER — TECHNETIUM TC 99M TETROFOSMIN IV KIT
11.0000 | PACK | Freq: Once | INTRAVENOUS | Status: AC | PRN
  Administered 2011-06-02: 11 via INTRAVENOUS

## 2011-06-02 NOTE — Progress Notes (Signed)
[facility] SITE [address] NUCLEAR MED [address] [place] [address] [phone]  Cardiology Nuclear Med [name] is a 78 y.o. male [id number] 06/16/1933   Nuclear Med Background Indication for Stress Test:  Evaluation for Ischemia and Stent Patency History:  '04 IWMI>Stent-RCA; '09 OZH:YQMVHQ, EF=68%; '09 Echo:Normal LVF Cardiac Risk Factors: Family History - CAD, History of Smoking, Hypertension, Lipids and Obesity  Symptoms:  Nausea, Vomiting and Diarrhea; Neck Soreness after Vomiting; "Indigestion"   Nuclear Pre-Procedure Caffeine/Decaff Intake:  None> 12 hrs NPO After: 7:30pm   Lungs:  Clear.  O2 SAT 94% on RA. IV 0.9% NS with Angio Cath:  22g  IV Site: R Hand x 1, tolerated well IV Started by:  Irean Hong, RN  Chest Size (in):  50 Cup Size: n/a  Height: 5\' 8"  (1.727 m)  Weight:  231 lb (104.781 kg)  BMI:  Body mass index is 35.12 kg/(m^2). Tech Comments:  Lopressor held x 24 hours; no medications taken this am    Nuclear Med Study 1 or 2 day study: 1 day  Stress Test Type:  Lexiscan  Reading MD: [name], MD  Order Authorizing Provider:  Rollene Rotunda, MD, and Tereso Newcomer, [facility]  Resting Radionuclide: Technetium 46m Tetrofosmin  Resting Radionuclide Dose: 11.0 mCi   Stress Radionuclide:  Technetium 6m Tetrofosmin  Stress Radionuclide Dose: 32.8 mCi           Stress Protocol Rest HR: 71 Stress HR: 102  Rest BP: Sitting:172/93; Standing:204/105 Stress BP: 198/105  Exercise Time (min): n/a METS: n/a   Predicted Max HR: 145 bpm % Max HR: 70.34 bpm Rate Pressure Product: 46962   Dose of Adenosine (mg):  n/a Dose of Lexiscan: 0.4 mg  Dose of Atropine (mg): n/a Dose of Dobutamine: n/a mcg/kg/min (at max HR)  Stress Test Technologist: Smiley Houseman, CMA-N  Nuclear Technologist:  Doyne Keel, CNMT     Rest Procedure:  Myocardial perfusion imaging was performed at rest 45 minutes following the intravenous administration of  Technetium 36m Tetrofosmin.  Rest ECG: LVH with strain.  Stress Procedure: The patient was initially scheduled to walk the treadmill utilizing the Bruce protocol, but due to a hypertensive response 204/105, he was changed to [employer].   The patient received IV Lexiscan 0.4 mg over 15-seconds.  Technetium 35m Tetrofosmin injected at 30-seconds.  There were no significant changes with Lexiscan.  Quantitative spect images were obtained after a 45 minute delay.  Stress ECG: No significant change from baseline ECG  QPS Raw Data Images:  Normal; no motion artifact; normal heart/lung ratio. Stress Images:  Normal homogeneous uptake in all areas of the myocardium. Rest Images:  Normal homogeneous uptake in all areas of the myocardium. Subtraction (SDS):  There is no evidence of scar or ischemia. Transient Ischemic Dilatation (Normal <1.22):  0.94 Lung/Heart Ratio (Normal <0.45):  0.31  Quantitative Gated Spect Images QGS EDV:  100 ml QGS ESV:  43 ml QGS cine images:  NL LV Function; NL Wall Motion QGS EF: 57%  Impression Exercise Capacity:  Lexiscan with no exercise. BP Response:  Normal blood pressure response. Clinical Symptoms:  Short of breath.  ECG Impression:  No significant ST segment change suggestive of ischemia. Comparison with Prior Nuclear Study: No significant change from previous study  Overall Impression:  Normal stress nuclear study.  [name] [employer]

## 2011-06-09 DIAGNOSIS — H6120 Impacted cerumen, unspecified ear: Secondary | ICD-10-CM | POA: Diagnosis not present

## 2011-06-11 DIAGNOSIS — R509 Fever, unspecified: Secondary | ICD-10-CM | POA: Diagnosis not present

## 2011-06-11 DIAGNOSIS — J069 Acute upper respiratory infection, unspecified: Secondary | ICD-10-CM | POA: Diagnosis not present

## 2011-06-15 DIAGNOSIS — J069 Acute upper respiratory infection, unspecified: Secondary | ICD-10-CM | POA: Diagnosis not present

## 2011-06-25 DIAGNOSIS — L570 Actinic keratosis: Secondary | ICD-10-CM | POA: Diagnosis not present

## 2011-06-25 DIAGNOSIS — D485 Neoplasm of uncertain behavior of skin: Secondary | ICD-10-CM | POA: Diagnosis not present

## 2011-06-30 ENCOUNTER — Other Ambulatory Visit: Payer: Self-pay | Admitting: Family Medicine

## 2011-06-30 DIAGNOSIS — R9389 Abnormal findings on diagnostic imaging of other specified body structures: Secondary | ICD-10-CM

## 2011-07-02 ENCOUNTER — Ambulatory Visit
Admission: RE | Admit: 2011-07-02 | Discharge: 2011-07-02 | Disposition: A | Discharge status: Home / Self Care | Payer: Medicare Other | Source: Ambulatory Visit | Attending: Family Medicine | Admitting: Family Medicine

## 2011-07-02 DIAGNOSIS — R9389 Abnormal findings on diagnostic imaging of other specified body structures: Secondary | ICD-10-CM

## 2011-07-02 DIAGNOSIS — I700 Atherosclerosis of aorta: Secondary | ICD-10-CM | POA: Diagnosis not present

## 2011-07-02 MED ORDER — IOHEXOL 300 MG/ML  SOLN: 75.0000 mL | Freq: Once | INTRAMUSCULAR | Status: AC | PRN

## 2011-08-20 ENCOUNTER — Encounter: Payer: Self-pay | Admitting: Cardiology

## 2011-08-20 ENCOUNTER — Ambulatory Visit (INDEPENDENT_AMBULATORY_CARE_PROVIDER_SITE_OTHER): Payer: Medicare Other | Admitting: Cardiology

## 2011-08-20 VITALS — BP 155/90 | HR 76 | Ht 68.0 in | Wt 235.0 lb

## 2011-08-20 DIAGNOSIS — I2510 Atherosclerotic heart disease of native coronary artery without angina pectoris: Secondary | ICD-10-CM

## 2011-08-20 DIAGNOSIS — E785 Hyperlipidemia, unspecified: Secondary | ICD-10-CM

## 2011-08-20 DIAGNOSIS — I10 Essential (primary) hypertension: Secondary | ICD-10-CM | POA: Diagnosis not present

## 2011-08-20 DIAGNOSIS — E669 Obesity, unspecified: Secondary | ICD-10-CM | POA: Diagnosis not present

## 2011-08-20 NOTE — Progress Notes (Signed)
HPI The patient presents for follow up of CAD.  He saw Tereso Newcomer this spring and had a stress test for evaluation of chest pain. This was negative for any evidence of ischemia. Since then he denies any new cardiovascular symptoms. The patient denies any new symptoms such as chest discomfort, neck or arm discomfort. There has been no new shortness of breath, PND or orthopnea. There have been no reported palpitations, presyncope or syncope.  He says he is limited in his activities because of left greater than right calf pain.  No Known Allergies  Current Outpatient Prescriptions  Medication Sig Dispense Refill  . acetaminophen (TYLENOL) 500 MG tablet Take 500 mg by mouth every 6 (six) hours as needed. For stuffy head.       . aspirin EC 81 MG tablet Take 81 mg by mouth every morning.        Marland Kitchen atorvastatin (LIPITOR) 80 MG tablet Take 80 mg by mouth at bedtime.        . carbamazepine (CARBATROL) 300 MG 12 hr capsule Take 300 mg by mouth 2 (two) times daily.        . cloNIDine (CATAPRES) 0.1 MG tablet Take 0.1 mg by mouth 2 (two) times daily.        . diazepam (VALIUM) 2 MG tablet Take 2 mg by mouth 2 (two) times daily.        . fluticasone (FLONASE) 50 MCG/ACT nasal spray Place 1 spray into the nose as needed.       . hydrochlorothiazide (MICROZIDE) 12.5 MG capsule Take 12.5 mg by mouth every morning.        Marland Kitchen levothyroxine (SYNTHROID, LEVOTHROID) 25 MCG tablet Take 25 mcg by mouth 2 (two) times daily.       Marland Kitchen losartan (COZAAR) 50 MG tablet Take 50 mg by mouth every morning.        . metoprolol (LOPRESSOR) 50 MG tablet Take 50 mg by mouth 2 (two) times daily.        . Multiple Vitamins-Minerals (MULTIVITAMINS THER. W/MINERALS) TABS Take 1 tablet by mouth at bedtime.        . silodosin (RAPAFLO) 8 MG CAPS capsule Take 8 mg by mouth at bedtime.          Past Medical History  Diagnosis Date  . Coronary artery disease     s/p MI tx with Cypher DES to pRCA in 4/04; LHC 4/04 in the setting of  his MI: Proximal LAD 30%, ostial diagonal 40-50%, ostial OM1 30%, proximal RCA 95% which was the infarct vessel   ;    Echocardiogram 7/09: Normal LV function.  Nuclear study 2009: EF 68%, no ischemia or infarct.     . Dyslipidemia   . Seizure disorder   . Hypothyroidism   . DDD (degenerative disc disease)   . Hx MRSA infection     left buttocks abscess  . Rotator cuff injury     chronic rotator cuff injury status post repair  . Hypertension   . Syncope   . Dizziness   . Meniere's disease     Status post shunt  . BPH (benign prostatic hyperplasia)     Past Surgical History  Procedure Date  . Coronary angioplasty with stent placement 07/14/2002    Stent to the right coronary artery  . Rotator cuff repair     for chronic rotator cuff injury  . Hernia repair 04/10/2008    scrotal hernia repair  . Endolymphatic shunt decompression  06/11/2009     Right endolymphatic sac decompression and shunt  placement    ROS:  As stated in the HPI and negative for all other systems.  PHYSICAL EXAM BP 155/90  Pulse 76  Ht 5\' 8"  (1.727 m)  Wt 235 lb (106.595 kg)  BMI 35.73 kg/m2 GENERAL:  Well appearing HEENT:  Pupils equal round and reactive, fundi not visualized, oral mucosa unremarkable NECK:  No jugular venous distention, waveform within normal limits, carotid upstroke brisk and symmetric, no bruits, no thyromegaly LYMPHATICS:  No cervical, inguinal adenopathy LUNGS:  Clear to auscultation bilaterally BACK:  No CVA tenderness CHEST:  Unremarkable HEART:  PMI not displaced or sustained,S1 and S2 within normal limits, no S3, no S4, no clicks, no rubs, no murmurs ABD:  Flat, positive bowel sounds normal in frequency in pitch, no bruits, no rebound, no guarding, no midline pulsatile mass, no hepatomegaly, no splenomegaly EXT:  2 plus pulses throughout, no edema, no cyanosis no clubbing SKIN:  No rashes no nodules NEURO:  Cranial nerves II through XII grossly intact, motor grossly intact  throughout PSYCH:  Cognitively intact, oriented to person place and time   ASSESSMENT AND PLAN

## 2011-08-20 NOTE — Assessment & Plan Note (Signed)
The patient has no new sypmtoms.  No further cardiovascular testing is indicated.  We will continue with aggressive risk reduction and meds as listed.  

## 2011-08-20 NOTE — Assessment & Plan Note (Signed)
The blood pressure continues to be high. I have instructed the patient to record a blood pressure diary and recording this. This will be presented for my review and pending these results I will make further suggestions about changes in therapy for optimal blood pressure control.  

## 2011-08-20 NOTE — Patient Instructions (Signed)
Please hold Lipitor for 1 month to see if your leg pain improves. Please call in 1 month to let us know if better   (Pam [phone]) Continue all other medications as list  Follow up in 6 months with Dr Antoine Poche.  You will receive a letter in the mail 2 months before you are due.  Please call us when you receive this letter to schedule your follow up appointment.

## 2011-08-20 NOTE — Assessment & Plan Note (Signed)
The patient understands the need to lose weight with diet and exercise. We have discussed specific strategies for this.  

## 2011-08-20 NOTE — Assessment & Plan Note (Signed)
He is going to hold his Lipitor for one month to see if his leg pain improves.  If not we will restart this medication. If it does improve he will call and we will try another statin.

## 2011-09-09 DIAGNOSIS — T148XXA Other injury of unspecified body region, initial encounter: Secondary | ICD-10-CM | POA: Diagnosis not present

## 2011-09-09 DIAGNOSIS — E119 Type 2 diabetes mellitus without complications: Secondary | ICD-10-CM | POA: Diagnosis not present

## 2011-09-29 ENCOUNTER — Encounter: Payer: Self-pay | Admitting: Cardiology

## 2011-09-29 DIAGNOSIS — E785 Hyperlipidemia, unspecified: Secondary | ICD-10-CM | POA: Diagnosis not present

## 2011-10-01 ENCOUNTER — Other Ambulatory Visit: Payer: Self-pay | Admitting: Cardiology

## 2011-10-11 DIAGNOSIS — H353190 Nonexudative age-related macular degeneration, unspecified eye, stage unspecified: Secondary | ICD-10-CM | POA: Diagnosis not present

## 2011-11-03 ENCOUNTER — Telehealth: Payer: Self-pay | Admitting: Cardiology

## 2011-11-03 NOTE — Telephone Encounter (Signed)
Pt rtn call re test results [phone]

## 2011-11-03 NOTE — Telephone Encounter (Signed)
Patient called was told Dr.Hochrein received lab from Dr.Pickard and LDL elevated.Pateint stated Dr.Pickard prescribed zocor 40 mg daily 1 month ago and he is to have repeat lipids in 3 months.

## 2011-11-10 DIAGNOSIS — L820 Inflamed seborrheic keratosis: Secondary | ICD-10-CM | POA: Diagnosis not present

## 2011-11-10 DIAGNOSIS — L570 Actinic keratosis: Secondary | ICD-10-CM | POA: Diagnosis not present

## 2011-12-20 DIAGNOSIS — H903 Sensorineural hearing loss, bilateral: Secondary | ICD-10-CM | POA: Diagnosis not present

## 2011-12-20 DIAGNOSIS — H6120 Impacted cerumen, unspecified ear: Secondary | ICD-10-CM | POA: Diagnosis not present

## 2012-01-13 DIAGNOSIS — H2510 Age-related nuclear cataract, unspecified eye: Secondary | ICD-10-CM | POA: Diagnosis not present

## 2012-01-25 DIAGNOSIS — J40 Bronchitis, not specified as acute or chronic: Secondary | ICD-10-CM | POA: Diagnosis not present

## 2012-02-09 ENCOUNTER — Encounter: Payer: Self-pay | Admitting: Cardiology

## 2012-02-09 ENCOUNTER — Ambulatory Visit (INDEPENDENT_AMBULATORY_CARE_PROVIDER_SITE_OTHER): Payer: Medicare Other | Admitting: Cardiology

## 2012-02-09 VITALS — BP 144/86 | HR 67 | Ht 68.0 in | Wt 230.0 lb

## 2012-02-09 DIAGNOSIS — I2510 Atherosclerotic heart disease of native coronary artery without angina pectoris: Secondary | ICD-10-CM

## 2012-02-09 DIAGNOSIS — I10 Essential (primary) hypertension: Secondary | ICD-10-CM | POA: Diagnosis not present

## 2012-02-09 DIAGNOSIS — E669 Obesity, unspecified: Secondary | ICD-10-CM

## 2012-02-09 DIAGNOSIS — E785 Hyperlipidemia, unspecified: Secondary | ICD-10-CM

## 2012-02-09 NOTE — Progress Notes (Signed)
HPI The patient presents for follow up of CAD. He was seen earlier this year with chest discomfort but had a negative stress perfusion study. His symptoms at that time were quite atypical. Since then he has had no further cardiovascular complaints. He denies any chest pressure, neck or arm discomfort. He denies any palpitations, presyncope or syncope. He has no shortness of breath, PND or orthopnea. Unfortunately he's not been exercising. I also don't think he is watching his diet and in particular.  No Known Allergies  Current Outpatient Prescriptions  Medication Sig Dispense Refill  . acetaminophen (TYLENOL) 500 MG tablet Take 500 mg by mouth every 6 (six) hours as needed. For stuffy head.       . aspirin EC 81 MG tablet Take 81 mg by mouth every morning.        . carbamazepine (CARBATROL) 300 MG 12 hr capsule Take 300 mg by mouth 2 (two) times daily.        . cloNIDine (CATAPRES) 0.1 MG tablet Take 0.1 mg by mouth 2 (two) times daily.        . diazepam (VALIUM) 2 MG tablet Take 2 mg by mouth 2 (two) times daily.        . fluticasone (FLONASE) 50 MCG/ACT nasal spray Place 1 spray into the nose as needed.       . hydrochlorothiazide (MICROZIDE) 12.5 MG capsule Take 12.5 mg by mouth every morning.        Marland Kitchen levothyroxine (SYNTHROID, LEVOTHROID) 25 MCG tablet Take 25 mcg by mouth 2 (two) times daily.       Marland Kitchen losartan (COZAAR) 50 MG tablet Take 50 mg by mouth every morning.        . metFORMIN (GLUCOPHAGE) 500 MG tablet Take 500 mg by mouth daily with breakfast.      . metoprolol (LOPRESSOR) 50 MG tablet Take 50 mg by mouth 2 (two) times daily.        . Multiple Vitamins-Minerals (MULTIVITAMINS THER. W/MINERALS) TABS Take 1 tablet by mouth at bedtime.        . silodosin (RAPAFLO) 8 MG CAPS capsule Take 8 mg by mouth at bedtime.        . simvastatin (ZOCOR) 40 MG tablet Take 40 mg by mouth every evening.        Past Medical History  Diagnosis Date  . Coronary artery disease     s/p MI tx  with Cypher DES to pRCA in 4/04; LHC 4/04 in the setting of his MI: Proximal LAD 30%, ostial diagonal 40-50%, ostial OM1 30%, proximal RCA 95% which was the infarct vessel   ;    Echocardiogram 7/09: Normal LV function.  Nuclear study  March 2013 negative for ischemia  . Dyslipidemia   . Seizure disorder   . Hypothyroidism   . DDD (degenerative disc disease)   . Hx MRSA infection     left buttocks abscess  . Rotator cuff injury     chronic rotator cuff injury status post repair  . Hypertension   . Syncope   . Dizziness   . Meniere's disease     Status post shunt  . BPH (benign prostatic hyperplasia)     Past Surgical History  Procedure Date  . Coronary angioplasty with stent placement 07/14/2002    Stent to the right coronary artery  . Rotator cuff repair     for chronic rotator cuff injury  . Hernia repair 04/10/2008    scrotal hernia repair  .  Endolymphatic shunt decompression 06/11/2009     Right endolymphatic sac decompression and shunt  placement    ROS:  As stated in the HPI and negative for all other systems.  PHYSICAL EXAM BP 144/86  Pulse 67  Ht 5\' 8"  (1.727 m)  Wt 230 lb (104.327 kg)  BMI 34.97 kg/m2  SpO2 99% GENERAL:  Well appearing HEENT:  Pupils equal round and reactive, fundi not visualized, oral mucosa unremarkable NECK:  No jugular venous distention, waveform within normal limits, carotid upstroke brisk and symmetric, no bruits, no thyromegaly LYMPHATICS:  No cervical, inguinal adenopathy LUNGS:  Clear to auscultation bilaterally BACK:  No CVA tenderness CHEST:  Unremarkable HEART:  PMI not displaced or sustained,S1 and S2 within normal limits, no S3, no S4, no clicks, no rubs, no murmurs ABD:  Flat, positive bowel sounds normal in frequency in pitch, no bruits, no rebound, no guarding, no midline pulsatile mass, no hepatomegaly, no splenomegaly EXT:  2 plus pulses throughout, no edema, no cyanosis no clubbing SKIN:  No rashes no nodules NEURO:   Cranial nerves II through XII grossly intact, motor grossly intact throughout PSYCH:  Cognitively intact, oriented to person place and time  EKG - sinus rhythm, rate 64, leftward axis, left ventricle hypertrophy by voltage criteria, inferolateral T wave inversion consistent with ischemia new since previous. 02/09/2012  ASSESSMENT AND PLAN  Coronary artery disease -  The patient has no new sypmtoms. He does have some change in his EKG but in the absence of symptoms and with a negative stress perfusion study earlier this year no further cardiovascular testing is indicated. We will continue with attempts at aggressive risk reduction although he's not particularly participating in this.   Hypertension -  The blood pressure continues to be high. However, he insists that it is in the 120s systolic at home with inaccurate cuff. Therefore, I will make no change to his regimen.  Obesity -  The patient understands the need to lose weight with diet and exercise. We have discussed specific strategies for this again.   Dyslipidemia -  He started simvastatin earlier this year for an LDL of 193. The goal should be an LDL less than 70. He didn't tolerate Lipitor. I will defer management to [facility] TOM, MD

## 2012-02-09 NOTE — Patient Instructions (Addendum)
The current medical regimen is effective;  continue present plan and medications.  Follow up in 1 year with Dr Hochrein.  You will receive a letter in the mail 2 months before you are due.  Please call us when you receive this letter to schedule your follow up appointment.  

## 2012-03-01 DIAGNOSIS — N400 Enlarged prostate without lower urinary tract symptoms: Secondary | ICD-10-CM | POA: Diagnosis not present

## 2012-03-08 DIAGNOSIS — N401 Enlarged prostate with lower urinary tract symptoms: Secondary | ICD-10-CM | POA: Diagnosis not present

## 2012-03-08 DIAGNOSIS — R972 Elevated prostate specific antigen [PSA]: Secondary | ICD-10-CM | POA: Diagnosis not present

## 2012-03-10 DIAGNOSIS — Z23 Encounter for immunization: Secondary | ICD-10-CM | POA: Diagnosis not present

## 2012-03-10 DIAGNOSIS — R040 Epistaxis: Secondary | ICD-10-CM | POA: Diagnosis not present

## 2012-03-28 DIAGNOSIS — S20219A Contusion of unspecified front wall of thorax, initial encounter: Secondary | ICD-10-CM | POA: Diagnosis not present

## 2012-04-17 ENCOUNTER — Encounter: Payer: Self-pay | Admitting: Cardiology

## 2012-04-17 ENCOUNTER — Ambulatory Visit (INDEPENDENT_AMBULATORY_CARE_PROVIDER_SITE_OTHER): Payer: Medicare Other | Admitting: Cardiology

## 2012-04-17 VITALS — BP 160/90 | HR 70 | Ht 68.0 in | Wt 235.8 lb

## 2012-04-17 DIAGNOSIS — I2510 Atherosclerotic heart disease of native coronary artery without angina pectoris: Secondary | ICD-10-CM | POA: Diagnosis not present

## 2012-04-17 DIAGNOSIS — I10 Essential (primary) hypertension: Secondary | ICD-10-CM | POA: Diagnosis not present

## 2012-04-17 DIAGNOSIS — E785 Hyperlipidemia, unspecified: Secondary | ICD-10-CM | POA: Diagnosis not present

## 2012-04-17 DIAGNOSIS — E669 Obesity, unspecified: Secondary | ICD-10-CM

## 2012-04-17 MED ORDER — LOSARTAN POTASSIUM 50 MG PO TABS: 50.0000 mg | ORAL_TABLET | Freq: Two times a day (BID) | ORAL | Status: DC

## 2012-04-17 NOTE — Progress Notes (Signed)
HPI The patient presents for follow up of CAD. He called an appointment because he heard from a friend that stents only last 8 years. He had a negative stress perfusion study last March. He's been getting some dyspnea with talking. He doesn't necessarily get with activity but he is pretty sedentary. He might back in and hasn't described any acute symptoms with this. He has not had any PND or orthopnea. He has not had any palpitations, presyncope or syncope. He has had no weight gain or edema. He does get some sporadic chest tightness does not like previous angina. It only lasts for a few seconds. He doesn't describe radiation or associated symptoms such as nausea or vomiting. He doesn't report any palpitations, presyncope or syncope.  No Known Allergies  Current Outpatient Prescriptions  Medication Sig Dispense Refill  . acetaminophen (TYLENOL) 500 MG tablet Take 500 mg by mouth every 6 (six) hours as needed. For stuffy head.       . aspirin EC 81 MG tablet Take 81 mg by mouth every morning.        . carbamazepine (CARBATROL) 300 MG 12 hr capsule Take 300 mg by mouth 2 (two) times daily.        . cloNIDine (CATAPRES) 0.1 MG tablet Take 0.1 mg by mouth 2 (two) times daily.        . diazepam (VALIUM) 2 MG tablet Take 2 mg by mouth 2 (two) times daily.        . fluticasone (FLONASE) 50 MCG/ACT nasal spray Place 1 spray into the nose as needed.       . hydrochlorothiazide (MICROZIDE) 12.5 MG capsule Take 12.5 mg by mouth every morning.        Marland Kitchen levothyroxine (SYNTHROID, LEVOTHROID) 25 MCG tablet Take 25 mcg by mouth 2 (two) times daily.       Marland Kitchen losartan (COZAAR) 50 MG tablet Take 50 mg by mouth every morning.        . metFORMIN (GLUCOPHAGE) 500 MG tablet Take 500 mg by mouth daily with breakfast.      . metoprolol (LOPRESSOR) 50 MG tablet Take 50 mg by mouth 2 (two) times daily.        . Multiple Vitamins-Minerals (MULTIVITAMINS THER. W/MINERALS) TABS Take 1 tablet by mouth at bedtime.        .  silodosin (RAPAFLO) 8 MG CAPS capsule Take 8 mg by mouth at bedtime.        . simvastatin (ZOCOR) 40 MG tablet Take 40 mg by mouth every evening.        Past Medical History  Diagnosis Date  . Coronary artery disease     s/p MI tx with Cypher DES to pRCA in 4/04; LHC 4/04 in the setting of his MI: Proximal LAD 30%, ostial diagonal 40-50%, ostial OM1 30%, proximal RCA 95% which was the infarct vessel   ;    Echocardiogram 7/09: Normal LV function.  Nuclear study  March 2013 negative for ischemia  . Dyslipidemia   . Seizure disorder   . Hypothyroidism   . DDD (degenerative disc disease)   . Hx MRSA infection     left buttocks abscess  . Rotator cuff injury     chronic rotator cuff injury status post repair  . Hypertension   . Syncope   . Dizziness   . Meniere's disease     Status post shunt  . BPH (benign prostatic hyperplasia)     Past Surgical History  Procedure Date  . Coronary angioplasty with stent placement 07/14/2002    Stent to the right coronary artery  . Rotator cuff repair     for chronic rotator cuff injury  . Hernia repair 04/10/2008    scrotal hernia repair  . Endolymphatic shunt decompression 06/11/2009     Right endolymphatic sac decompression and shunt  placement    ROS:  As stated in the HPI and negative for all other systems.  PHYSICAL EXAM BP 160/90  Pulse 70  Ht 5\' 8"  (1.727 m)  Wt 235 lb 12.8 oz (106.958 kg)  BMI 35.85 kg/m2 GENERAL:  Well appearing HEENT:  Pupils equal round and reactive, fundi not visualized, oral mucosa unremarkable NECK:  No jugular venous distention, waveform within normal limits, carotid upstroke brisk and symmetric, no bruits, no thyromegaly LYMPHATICS:  No cervical, inguinal adenopathy LUNGS:  Clear to auscultation bilaterally BACK:  No CVA tenderness CHEST:  Unremarkable HEART:  PMI not displaced or sustained,S1 and S2 within normal limits, no S3, no S4, no clicks, no rubs, no murmurs ABD:  Flat, positive bowel sounds  normal in frequency in pitch, no bruits, no rebound, no guarding, no midline pulsatile mass, no hepatomegaly, no splenomegaly EXT:  2 plus pulses throughout, no edema, no cyanosis no clubbing SKIN:  No rashes no nodules NEURO:  Cranial nerves II through XII grossly intact, motor grossly intact throughout PSYCH:  Cognitively intact, oriented to person place and time  EKG - sinus rhythm, rate 67, leftward axis, left ventricle hypertrophy by voltage criteria, nonspecific ST T wave changes.  04/17/2012  ASSESSMENT AND PLAN  Coronary artery disease -  He does have some chest discomfort which I think is atypical.  I will bring the patient back for a POET (Plain Old Exercise Test). This will allow me to screen for obstructive coronary disease, risk stratify and very importantly provide a prescription for exercise.  Hypertension -  The blood pressure continues to be high. He is not taking his blood pressure routinely at home. I've asked him to start doing this. I'm going to increase his Cozaar to 50 mg twice a day.  Obesity -  The patient understands the need to lose weight with diet and exercise. We will give him a prescription for exercise based on the treadmill test.  Dyslipidemia -  The patient was started on simvastatin by [name], MD last summer. I have instructed him to go back and have a followup lipid profile.

## 2012-04-17 NOTE — Patient Instructions (Addendum)
Please increase your Cozaar 50 mg twice a day. Continue all other medications as listed.  Your physician has requested that you have an exercise tolerance test. For further information please visit [email or web address] Please also follow instruction sheet, as given.  Follow up will be based on these results.

## 2012-05-08 DIAGNOSIS — E782 Mixed hyperlipidemia: Secondary | ICD-10-CM | POA: Diagnosis not present

## 2012-05-08 DIAGNOSIS — Z79899 Other long term (current) drug therapy: Secondary | ICD-10-CM | POA: Diagnosis not present

## 2012-05-08 DIAGNOSIS — R7309 Other abnormal glucose: Secondary | ICD-10-CM | POA: Diagnosis not present

## 2012-05-11 DIAGNOSIS — L570 Actinic keratosis: Secondary | ICD-10-CM | POA: Diagnosis not present

## 2012-05-15 ENCOUNTER — Ambulatory Visit (INDEPENDENT_AMBULATORY_CARE_PROVIDER_SITE_OTHER): Payer: Medicare Other | Admitting: Cardiology

## 2012-05-15 DIAGNOSIS — I2510 Atherosclerotic heart disease of native coronary artery without angina pectoris: Secondary | ICD-10-CM

## 2012-05-15 NOTE — Procedures (Signed)
Exercise Treadmill Test  Pre-Exercise Testing Evaluation Rhythm: normal sinus  Rate: 75     Test  Exercise Tolerance Test Ordering MD: Angelina Sheriff, MD  Interpreting MD: Angelina Sheriff, MD  Unique Test No: 1  Treadmill:  1  Indication for ETT: cad  Contraindication to ETT: No   Stress Modality: exercise - treadmill  Cardiac Imaging Performed: non   Protocol: standard Bruce - maximal  Max BP:  211/79  Max MPHR (bpm):  144 85% MPR (bpm):  124  MPHR obtained (bpm):  143 % MPHR obtained:  99  Reached 85% MPHR (min:sec):  3:26 Total Exercise Time (min-sec):  6:01  Workload in METS:  6.41 Borg Scale: 15  Reason ETT Terminated:  desired heart rate attained    ST Segment Analysis At Rest: normal ST segments - no evidence of significant ST depression With Exercise: no evidence of significant ST depression  Other Information Arrhythmia:  No Angina during ETT:  absent (0) Quality of ETT:  diagnostic  ETT Interpretation:  normal - no evidence of ischemia by ST analysis  Comments: The patient had an excellent exercise tolerance.  There was no chest pain.  There was an appropriate level of dyspnea.  There were a few ventricular couplets with in recovery,a normal heart rate response and normal BP response.  There were no ischemic ST T wave changes.    Recommendations: Negative adequate ETT.  No further testing is indicated.  Based on the above I gave the patient a prescription for exercise.

## 2012-06-07 ENCOUNTER — Telehealth: Payer: Self-pay | Admitting: Family Medicine

## 2012-06-07 ENCOUNTER — Encounter: Payer: Self-pay | Admitting: Family Medicine

## 2012-06-07 ENCOUNTER — Ambulatory Visit (INDEPENDENT_AMBULATORY_CARE_PROVIDER_SITE_OTHER): Payer: Medicare Other | Admitting: Family Medicine

## 2012-06-07 VITALS — BP 160/74 | HR 58 | Temp 98.6°F | Resp 18 | Wt 237.0 lb

## 2012-06-07 DIAGNOSIS — IMO0001 Reserved for inherently not codable concepts without codable children: Secondary | ICD-10-CM | POA: Diagnosis not present

## 2012-06-07 DIAGNOSIS — E785 Hyperlipidemia, unspecified: Secondary | ICD-10-CM

## 2012-06-07 DIAGNOSIS — I10 Essential (primary) hypertension: Secondary | ICD-10-CM

## 2012-06-07 MED ORDER — METFORMIN HCL 500 MG PO TABS: 500.0000 mg | ORAL_TABLET | Freq: Two times a day (BID) | ORAL | Status: DC

## 2012-06-07 MED ORDER — DIAZEPAM 2 MG PO TABS: 2.0000 mg | ORAL_TABLET | Freq: Two times a day (BID) | ORAL | Status: DC

## 2012-06-07 MED ORDER — ATORVASTATIN CALCIUM 40 MG PO TABS: 40.0000 mg | ORAL_TABLET | Freq: Every day | ORAL | Status: DC

## 2012-06-07 MED ORDER — SULFAMETHOXAZOLE-TRIMETHOPRIM 800-160 MG PO TABS: 1.0000 | ORAL_TABLET | Freq: Two times a day (BID) | ORAL | Status: DC

## 2012-06-07 NOTE — Telephone Encounter (Signed)
Refill at today's appointment.  Hard copy given to patient.

## 2012-06-07 NOTE — Progress Notes (Signed)
Subjective:     Patient ID: [name], male   DOB: October 09, 1933, 79 y.o.   MRN: [id number]  HPI Patient is a 79 year old male here for followup of multiple medical problems. #1 diabetes-  he discontinued all hypoglycemic agents.  He was to manage his diabetes with diet alone.  Recent lab work revealed a fasting blood sugar of 138 hemoglobin A1c is 6.8.  He denies any polyuria polydipsia or blurred vision.  Denies any neuropathy in the feet. #2 is hypertension- he is currently taking clonidine 0.1 mg by mouth twice a day, chlorothiazide 12.5 mg by mouth, losartan 50 mg by mouth twice a day, and Lopressor 50 mg by mouth twice a day.  He denies any chest pain shortness of breath or dyspnea on exertion.  On pressures are in the 130s to 140s over 70s.   #3 hyperlipidemia-he is currently on Zocor 40 mg by mouth daily.  He denies any myalgias or right upper quadrant pain.  Recent lab work revealed a triglyceride of 1:15 and HDL of 63 and LDL of 154.  His goal as a diabetic is less than 100.  Review of Systems  Constitutional: Negative.   HENT: Negative.   Eyes: Negative.   Respiratory: Negative.   Cardiovascular: Negative.   Gastrointestinal: Negative.   Genitourinary: Negative.        Objective:   Physical Exam  Constitutional: He appears well-developed and well-nourished.  HENT:  Head: Normocephalic and atraumatic.  Eyes: Conjunctivae and EOM are normal. Pupils are equal, round, and reactive to light.  Neck: Normal range of motion. Neck supple. No thyromegaly present.  Cardiovascular: Normal rate, regular rhythm, normal heart sounds and intact distal pulses.   Pulmonary/Chest: Effort normal and breath sounds normal.  Abdominal: Soft. Bowel sounds are normal.  Skin: Skin is warm and dry.   he has no carotid bruit.  Dorsalis pedis pulses and posterior tibialis pulses are two over four equal and symmetric bilaterally.  He has normal sensation to 10 mg monofilament in both feet.  There are  no cuts abrasions or ulcers on his feet.     Assessment:     #1 diabetes-  resume metformin 500 mg by mouth twice a day.  Recheck hemoglobin A1c in 3 months.  A low-carb diet was counseled.  Exercises recommended. #2 hyperlipidemia-  Zocor was discontinued.  Patient was switched to Lipitor 40 mg by mouth daily.  Like to repeat his CMP and fasting lipid panel in 3 months. #3 hypertension- advise the patient to take his blood pressures daily with his cuff and bring those numbers in along with his cuff in one week.  We'll check his cuff for accuracy.  If his blood pressure prove to be higher than 140/90, we will increase clonidine to 0.2 mg by mouth twice a day.      Plan:    see above

## 2012-06-08 ENCOUNTER — Telehealth: Payer: Self-pay | Admitting: Family Medicine

## 2012-06-08 NOTE — Telephone Encounter (Signed)
i put him on atorvastatin 40 mg not simvastatin.  Take the lipitor as prescribed.  Re check 3 months.

## 2012-06-08 NOTE — Telephone Encounter (Signed)
Pt aware.

## 2012-06-19 DIAGNOSIS — H6120 Impacted cerumen, unspecified ear: Secondary | ICD-10-CM | POA: Diagnosis not present

## 2012-06-26 ENCOUNTER — Telehealth: Payer: Self-pay | Admitting: Family Medicine

## 2012-06-26 MED ORDER — CLONIDINE HCL 0.1 MG PO TABS: 0.1000 mg | ORAL_TABLET | Freq: Two times a day (BID) | ORAL | Status: DC

## 2012-06-26 NOTE — Telephone Encounter (Signed)
rx refilled.

## 2012-06-26 NOTE — Telephone Encounter (Signed)
Ok to refill with 6 refills

## 2012-06-26 NOTE — Telephone Encounter (Signed)
?  ok to refill °

## 2012-07-19 ENCOUNTER — Encounter: Payer: Self-pay | Admitting: Family Medicine

## 2012-07-19 ENCOUNTER — Ambulatory Visit (INDEPENDENT_AMBULATORY_CARE_PROVIDER_SITE_OTHER): Payer: Medicare Other | Admitting: Family Medicine

## 2012-07-19 VITALS — BP 120/70 | HR 72 | Temp 97.8°F | Resp 14 | Wt 232.0 lb

## 2012-07-19 DIAGNOSIS — I10 Essential (primary) hypertension: Secondary | ICD-10-CM | POA: Diagnosis not present

## 2012-07-19 DIAGNOSIS — E039 Hypothyroidism, unspecified: Secondary | ICD-10-CM

## 2012-07-19 DIAGNOSIS — IMO0001 Reserved for inherently not codable concepts without codable children: Secondary | ICD-10-CM | POA: Diagnosis not present

## 2012-07-19 DIAGNOSIS — E785 Hyperlipidemia, unspecified: Secondary | ICD-10-CM | POA: Diagnosis not present

## 2012-07-19 LAB — BASIC METABOLIC PANEL
BUN: 16 mg/dL (ref 6–23)
CO2: 31 mEq/L (ref 19–32)
Calcium: 9.7 mg/dL (ref 8.4–10.5)
Chloride: 97 mEq/L (ref 96–112)
Creat: 0.97 mg/dL (ref 0.50–1.35)
Glucose, Bld: 124 mg/dL — ABNORMAL HIGH (ref 70–99)
Potassium: 5 mEq/L (ref 3.5–5.3)
Sodium: 136 mEq/L (ref 135–145)

## 2012-07-19 LAB — LIPID PANEL
Cholesterol: 195 mg/dL (ref 0–200)
HDL: 63 mg/dL (ref >39)
LDL Cholesterol: 107 mg/dL — ABNORMAL HIGH (ref 0–99)
Total CHOL/HDL Ratio: 3.1 Ratio
Triglycerides: 126 mg/dL (ref <150)
VLDL: 25 mg/dL (ref 0–40)

## 2012-07-19 LAB — TSH: TSH: 2.656 u[IU]/mL (ref 0.350–4.500)

## 2012-07-19 LAB — HEPATIC FUNCTION PANEL
ALT: 17 U/L (ref 0–53)
AST: 21 U/L (ref 0–37)
Albumin: 4.2 g/dL (ref 3.5–5.2)
Alkaline Phosphatase: 70 U/L (ref 39–117)
Bilirubin, Direct: 0.1 mg/dL (ref 0.0–0.3)
Indirect Bilirubin: 0.3 mg/dL (ref 0.0–0.9)
Total Bilirubin: 0.4 mg/dL (ref 0.3–1.2)
Total Protein: 6.7 g/dL (ref 6.0–8.3)

## 2012-07-19 LAB — HEMOGLOBIN A1C
Hgb A1c MFr Bld: 6.5 % — ABNORMAL HIGH (ref <5.7)
Mean Plasma Glucose: 140 mg/dL — ABNORMAL HIGH (ref <117)

## 2012-07-19 NOTE — Patient Instructions (Signed)
Increase hydrochlorothiazide to 25 mg a day. (2 pills).

## 2012-07-19 NOTE — Progress Notes (Signed)
Subjective:    Patient ID: [name], male    DOB: [birth year], 79 y.o.   MRN: [id number]  HPI Patient is here for followup of his hypertension, hyperlipidemia, diabetes, and hypothyroidism.  His blood pressure is currently running 130s to 150s at home. We rechecked his home cuff here and found to be 10.5 then on a blood pressure reading. However my blood pressure reading did show her to be 140/76. His goal blood pressure is a diabetic would be 130/80. He denies any chest pain shortness breath or dyspnea on exertion.  With regard to his hyperlipidemia, he is currently taking Lipitor 40 mg by mouth daily he denies any myalgias or right upper quadrant pain.  With regards to his diabetes he is currently on metformin. He denies any diarrhea. He denies any polyuria, polydipsia, or blurred vision. He denies any neuropathy.  He is also due for his TSH for his hypothyroidism. He is currently on levothyroxine 25 mcg by mouth daily. Past Medical History  Diagnosis Date  . Coronary artery disease     s/p MI tx with Cypher DES to pRCA in 4/04; LHC 4/04 in the setting of his MI: Proximal LAD 30%, ostial diagonal 40-50%, ostial OM1 30%, proximal RCA 95% which was the infarct vessel   ;    Echocardiogram 7/09: Normal LV function.  Nuclear study  March 2013 negative for ischemia  . Dyslipidemia   . Seizure disorder   . Hypothyroidism   . DDD (degenerative disc disease)   . Hx MRSA infection     left buttocks abscess  . Rotator cuff injury     chronic rotator cuff injury status post repair  . Hypertension   . Syncope   . Dizziness   . Meniere's disease     Status post shunt  . BPH (benign prostatic hyperplasia)    Current Outpatient Prescriptions on File Prior to Visit  Medication Sig Dispense Refill  . aspirin EC 81 MG tablet Take 81 mg by mouth every morning.        Marland Kitchen atorvastatin (LIPITOR) 40 MG tablet Take 1 tablet (40 mg total) by mouth daily.  30 tablet  5  . carbamazepine (CARBATROL)  300 MG 12 hr capsule Take 300 mg by mouth 2 (two) times daily.        . cloNIDine (CATAPRES) 0.1 MG tablet Take 1 tablet (0.1 mg total) by mouth 2 (two) times daily.  60 tablet  5  . diazepam (VALIUM) 2 MG tablet Take 1 tablet (2 mg total) by mouth 2 (two) times daily.  60 tablet  2  . fluticasone (FLONASE) 50 MCG/ACT nasal spray Place 1 spray into the nose as needed.       . hydrochlorothiazide (MICROZIDE) 12.5 MG capsule Take 12.5 mg by mouth every morning.        Marland Kitchen levothyroxine (SYNTHROID, LEVOTHROID) 25 MCG tablet Take 25 mcg by mouth 2 (two) times daily.       Marland Kitchen losartan (COZAAR) 50 MG tablet Take 1 tablet (50 mg total) by mouth 2 (two) times daily.  60 tablet  11  . metFORMIN (GLUCOPHAGE) 500 MG tablet Take 1 tablet (500 mg total) by mouth 2 (two) times daily with a meal.  60 tablet  5  . metoprolol (LOPRESSOR) 50 MG tablet Take 50 mg by mouth 2 (two) times daily.        . Multiple Vitamins-Minerals (MULTIVITAMINS THER. W/MINERALS) TABS Take 1 tablet by mouth at bedtime.        Marland Kitchen  silodosin (RAPAFLO) 8 MG CAPS capsule Take 8 mg by mouth at bedtime.         No current facility-administered medications on file prior to visit.   No Known Allergies History   Social History  . Marital Status: Married    Spouse Name: N/A    Number of Children: N/A  . Years of Education: N/A   Occupational History  . Not on file.   Social History Main Topics  . Smoking status: Former Smoker    Quit date: 08/19/1961  . Smokeless tobacco: Not on file  . Alcohol Use: No  . Drug Use: No  . Sexually Active: Not on file   Other Topics Concern  . Not on file   Social History Narrative  . No narrative on file      Review of Systems  All other systems reviewed and are negative.       Objective:   Physical Exam  Constitutional: He is oriented to person, place, and time. He appears well-developed and well-nourished.  HENT:  Head: Normocephalic.  Right Ear: External ear normal.  Left Ear:  External ear normal.  Nose: Nose normal.  Mouth/Throat: Oropharynx is clear and moist.  Eyes: Conjunctivae are normal. Pupils are equal, round, and reactive to light.  Neck: Normal range of motion. Neck supple. No JVD present. No thyromegaly present.  Cardiovascular: Normal rate, regular rhythm, normal heart sounds and intact distal pulses.  Exam reveals no gallop and no friction rub.   No murmur heard. Pulmonary/Chest: Effort normal and breath sounds normal. No respiratory distress. He has no wheezes. He has no rales. He exhibits no tenderness.  Abdominal: Soft. Bowel sounds are normal. He exhibits no distension and no mass. There is no tenderness. There is no rebound and no guarding.  Lymphadenopathy:    He has no cervical adenopathy.  Neurological: He is alert and oriented to person, place, and time. He has normal reflexes. He displays normal reflexes. No cranial nerve deficit. Coordination normal.   patient has normal sensation in his feet to 10 mg on a filament bilaterally. He also has good peripheral pulses two over four at the dorsalis pedis as well as posterior tibialis bilaterally.        Assessment & Plan:  1. Type II or unspecified type diabetes mellitus without mention of complication, uncontrolled Discussed low carbohydrate diet. Recheck his hemoglobin A1c. Goal A1c is less than 6.5 - Basic Metabolic Panel - Hemoglobin A1c  2. Essential hypertension, benign Any the hydrochlorothiazide to 25 mg by mouth daily - Basic Metabolic Panel  3. Other and unspecified hyperlipidemia Check fasting lipid panel, goal LDL is less than 100 the - Hepatic Function Panel - Lipid Panel  4. Unspecified hypothyroidism Will check his TSH and titrate his medication to achieve a goal TSH - TSH

## 2012-07-27 ENCOUNTER — Other Ambulatory Visit: Payer: Self-pay | Admitting: Family Medicine

## 2012-07-27 ENCOUNTER — Telehealth: Payer: Self-pay | Admitting: Family Medicine

## 2012-07-28 MED ORDER — HYDROCHLOROTHIAZIDE 25 MG PO TABS: 25.0000 mg | ORAL_TABLET | Freq: Every day | ORAL | Status: DC

## 2012-07-28 NOTE — Telephone Encounter (Signed)
Rx Refilled  

## 2012-08-21 ENCOUNTER — Other Ambulatory Visit: Payer: Self-pay | Admitting: Family Medicine

## 2012-08-28 ENCOUNTER — Other Ambulatory Visit: Payer: Self-pay | Admitting: Family Medicine

## 2012-09-07 ENCOUNTER — Ambulatory Visit: Payer: Medicare Other | Admitting: Family Medicine

## 2012-09-08 ENCOUNTER — Other Ambulatory Visit: Payer: Self-pay | Admitting: Family Medicine

## 2012-09-08 NOTE — Telephone Encounter (Signed)
?  ok to refill °

## 2012-09-08 NOTE — Telephone Encounter (Signed)
Ok to refill 

## 2012-09-21 ENCOUNTER — Other Ambulatory Visit: Payer: Self-pay | Admitting: Family Medicine

## 2012-10-25 ENCOUNTER — Ambulatory Visit (INDEPENDENT_AMBULATORY_CARE_PROVIDER_SITE_OTHER): Payer: Medicare Other | Admitting: Family Medicine

## 2012-10-25 ENCOUNTER — Encounter: Payer: Self-pay | Admitting: Family Medicine

## 2012-10-25 VITALS — BP 100/60 | HR 80 | Temp 98.3°F | Resp 14 | Wt 227.0 lb

## 2012-10-25 DIAGNOSIS — M545 Low back pain, unspecified: Secondary | ICD-10-CM

## 2012-10-25 MED ORDER — MELOXICAM 15 MG PO TABS: 15.0000 mg | ORAL_TABLET | Freq: Every day | ORAL | Status: DC

## 2012-10-25 NOTE — Progress Notes (Signed)
Subjective:    Patient ID: [name], male    DOB: February 03, 1934, 79 y.o.   MRN: [id number]  HPI One week ago, the patient hurt his back moving furniture at his granddaughter's baby shower.  He complains of pain in his right lower flank just superior to his gluteus maximus.  The pain is worse with walking and standing. It is better when he sits or lies down. He denies any sciatica. He denies any symptoms of cauda equina syndrome. He denies any symptoms of a lumbar radiculopathy. The pain is a dull constant ache just superior to his gluteus.  There is no radiation of the pain. He denies any leg weakness. Past Medical History  Diagnosis Date  . Coronary artery disease     s/p MI tx with Cypher DES to pRCA in 4/04; LHC 4/04 in the setting of his MI: Proximal LAD 30%, ostial diagonal 40-50%, ostial OM1 30%, proximal RCA 95% which was the infarct vessel   ;    Echocardiogram 7/09: Normal LV function.  Nuclear study  March 2013 negative for ischemia  . Dyslipidemia   . Seizure disorder   . Hypothyroidism   . DDD (degenerative disc disease)   . Hx MRSA infection     left buttocks abscess  . Rotator cuff injury     chronic rotator cuff injury status post repair  . Hypertension   . Syncope   . Dizziness   . Meniere's disease     Status post shunt  . BPH (benign prostatic hyperplasia)    Current Outpatient Prescriptions on File Prior to Visit  Medication Sig Dispense Refill  . aspirin EC 81 MG tablet Take 81 mg by mouth every morning.        Marland Kitchen atorvastatin (LIPITOR) 40 MG tablet Take 1 tablet (40 mg total) by mouth daily.  30 tablet  5  . carbamazepine (CARBATROL) 300 MG 12 hr capsule TAKE 1 CAPSULE BY MOUTH TWICE A DAY  60 capsule  2  . cloNIDine (CATAPRES) 0.1 MG tablet Take 1 tablet (0.1 mg total) by mouth 2 (two) times daily.  60 tablet  5  . diazepam (VALIUM) 2 MG tablet TAKE 1 TABLET BY MOUTH 2 TIMES DAILY  60 tablet  2  . fluticasone (FLONASE) 50 MCG/ACT nasal spray Place 1 spray into  the nose as needed.       . hydrochlorothiazide (HYDRODIURIL) 25 MG tablet Take 1 tablet (25 mg total) by mouth daily.  30 tablet  5  . hydrochlorothiazide (MICROZIDE) 12.5 MG capsule TAKE 1 CAPSULE BY MOUTH EVERY DAY  30 capsule  5  . levothyroxine (SYNTHROID, LEVOTHROID) 25 MCG tablet TAKE 2 TABLETS EVERY DAY  60 tablet  6  . losartan (COZAAR) 50 MG tablet Take 1 tablet (50 mg total) by mouth 2 (two) times daily.  60 tablet  11  . metFORMIN (GLUCOPHAGE) 500 MG tablet Take 1 tablet (500 mg total) by mouth 2 (two) times daily with a meal.  60 tablet  5  . metoprolol (LOPRESSOR) 50 MG tablet TAKE 1 TABLET TWICE DAILY  60 tablet  5  . Multiple Vitamins-Minerals (MULTIVITAMINS THER. W/MINERALS) TABS Take 1 tablet by mouth at bedtime.        . silodosin (RAPAFLO) 8 MG CAPS capsule Take 8 mg by mouth at bedtime.         No current facility-administered medications on file prior to visit.   No Known Allergies History   Social History  .  Marital Status: Married    Spouse Name: N/A    Number of Children: N/A  . Years of Education: N/A   Occupational History  . Not on file.   Social History Main Topics  . Smoking status: Former Smoker    Quit date: 08/19/1961  . Smokeless tobacco: Not on file  . Alcohol Use: No  . Drug Use: No  . Sexually Active: Not on file   Other Topics Concern  . Not on file   Social History Narrative  . No narrative on file      Review of Systems  All other systems reviewed and are negative.       Objective:   Physical Exam  Vitals reviewed. Constitutional: He is oriented to person, place, and time. He appears well-developed and well-nourished.  Cardiovascular: Normal rate and regular rhythm.   Pulmonary/Chest: Effort normal and breath sounds normal.  Musculoskeletal:       Lumbar back: He exhibits tenderness and spasm. He exhibits normal range of motion and no bony tenderness.  Neurological: He is alert and oriented to person, place, and time. He  has normal reflexes. He displays normal reflexes. No cranial nerve deficit. He exhibits normal muscle tone. Coordination normal.          Assessment & Plan:  1. Low back pain I believe the patient strained a muscle in his lower back. I recommended rest, moist heat, meloxicam 15 mg by mouth daily. Also recommended gentle range of motion exercises. Recheck in 1 week if no better sooner if worse. - meloxicam (MOBIC) 15 MG tablet; Take 1 tablet (15 mg total) by mouth daily.  Dispense: 30 tablet; Refill: 0

## 2012-12-03 ENCOUNTER — Encounter (HOSPITAL_COMMUNITY): Payer: Self-pay

## 2012-12-03 ENCOUNTER — Emergency Department (HOSPITAL_COMMUNITY): Payer: Medicare Other

## 2012-12-03 ENCOUNTER — Observation Stay (HOSPITAL_COMMUNITY)
Admission: EM | Admit: 2012-12-03 | Discharge: 2012-12-05 | Disposition: A | Discharge status: Home / Self Care | Payer: Medicare Other | Attending: Family Medicine | Admitting: Family Medicine

## 2012-12-03 DIAGNOSIS — E785 Hyperlipidemia, unspecified: Secondary | ICD-10-CM | POA: Diagnosis present

## 2012-12-03 DIAGNOSIS — Z7982 Long term (current) use of aspirin: Secondary | ICD-10-CM | POA: Insufficient documentation

## 2012-12-03 DIAGNOSIS — G459 Transient cerebral ischemic attack, unspecified: Secondary | ICD-10-CM

## 2012-12-03 DIAGNOSIS — R278 Other lack of coordination: Secondary | ICD-10-CM

## 2012-12-03 DIAGNOSIS — M542 Cervicalgia: Secondary | ICD-10-CM | POA: Diagnosis not present

## 2012-12-03 DIAGNOSIS — I700 Atherosclerosis of aorta: Secondary | ICD-10-CM | POA: Insufficient documentation

## 2012-12-03 DIAGNOSIS — I2510 Atherosclerotic heart disease of native coronary artery without angina pectoris: Secondary | ICD-10-CM | POA: Diagnosis present

## 2012-12-03 DIAGNOSIS — G319 Degenerative disease of nervous system, unspecified: Secondary | ICD-10-CM | POA: Insufficient documentation

## 2012-12-03 DIAGNOSIS — Z79899 Other long term (current) drug therapy: Secondary | ICD-10-CM | POA: Insufficient documentation

## 2012-12-03 DIAGNOSIS — I10 Essential (primary) hypertension: Secondary | ICD-10-CM | POA: Diagnosis present

## 2012-12-03 DIAGNOSIS — R279 Unspecified lack of coordination: Principal | ICD-10-CM | POA: Insufficient documentation

## 2012-12-03 DIAGNOSIS — E669 Obesity, unspecified: Secondary | ICD-10-CM

## 2012-12-03 DIAGNOSIS — E039 Hypothyroidism, unspecified: Secondary | ICD-10-CM | POA: Diagnosis not present

## 2012-12-03 DIAGNOSIS — R4182 Altered mental status, unspecified: Secondary | ICD-10-CM | POA: Diagnosis not present

## 2012-12-03 LAB — COMPREHENSIVE METABOLIC PANEL
ALT: 13 U/L (ref 0–53)
AST: 17 U/L (ref 0–37)
Albumin: 3.9 g/dL (ref 3.5–5.2)
Alkaline Phosphatase: 72 U/L (ref 39–117)
BUN: 17 mg/dL (ref 6–23)
CO2: 25 mEq/L (ref 19–32)
Calcium: 9.3 mg/dL (ref 8.4–10.5)
Chloride: 97 mEq/L (ref 96–112)
Creatinine, Ser: 0.89 mg/dL (ref 0.50–1.35)
GFR calc Af Amer: >90 mL/min (ref >90)
GFR calc non Af Amer: 80 mL/min — ABNORMAL LOW (ref >90)
Glucose, Bld: 120 mg/dL — ABNORMAL HIGH (ref 70–99)
Potassium: 3.8 mEq/L (ref 3.5–5.1)
Sodium: 133 mEq/L — ABNORMAL LOW (ref 135–145)
Total Bilirubin: 0.2 mg/dL — ABNORMAL LOW (ref 0.3–1.2)
Total Protein: 6.9 g/dL (ref 6.0–8.3)

## 2012-12-03 LAB — CBC
HCT: 39.5 % (ref 39.0–52.0)
Hemoglobin: 13.6 g/dL (ref 13.0–17.0)
MCH: 31 pg (ref 26.0–34.0)
MCHC: 34.4 g/dL (ref 30.0–36.0)
MCV: 90 fL (ref 78.0–100.0)
Platelets: 288 10*3/uL (ref 150–400)
RBC: 4.39 MIL/uL (ref 4.22–5.81)
RDW: 13.9 % (ref 11.5–15.5)
WBC: 8.3 10*3/uL (ref 4.0–10.5)

## 2012-12-03 LAB — MRSA PCR SCREENING: MRSA by PCR: NEGATIVE

## 2012-12-03 LAB — POCT I-STAT TROPONIN I: Troponin i, poc: 0.01 ng/mL (ref 0.00–0.08)

## 2012-12-03 MED ORDER — HYDROCHLOROTHIAZIDE 25 MG PO TABS
25.0000 mg | ORAL_TABLET | Freq: Every day | ORAL | Status: DC
  Administered 2012-12-04 – 2012-12-05 (×2): 25 mg via ORAL
  Filled 2012-12-03 (×2): qty 1

## 2012-12-03 MED ORDER — ATORVASTATIN CALCIUM 40 MG PO TABS
40.0000 mg | ORAL_TABLET | Freq: Every day | ORAL | Status: DC
  Administered 2012-12-03 – 2012-12-05 (×3): 40 mg via ORAL
  Filled 2012-12-03 (×3): qty 1

## 2012-12-03 MED ORDER — CARBAMAZEPINE ER 300 MG PO CP12: 300.0000 mg | ORAL_CAPSULE | Freq: Two times a day (BID) | ORAL | Status: DC

## 2012-12-03 MED ORDER — CLONIDINE HCL 0.1 MG PO TABS
0.1000 mg | ORAL_TABLET | Freq: Two times a day (BID) | ORAL | Status: DC
  Administered 2012-12-03 – 2012-12-05 (×4): 0.1 mg via ORAL
  Filled 2012-12-03 (×5): qty 1

## 2012-12-03 MED ORDER — DIAZEPAM 2 MG PO TABS
1.0000 mg | ORAL_TABLET | Freq: Two times a day (BID) | ORAL | Status: DC
  Administered 2012-12-03 – 2012-12-05 (×4): 1 mg via ORAL
  Filled 2012-12-03 (×4): qty 1

## 2012-12-03 MED ORDER — SODIUM CHLORIDE 0.9 % IJ SOLN: 3.0000 mL | INTRAMUSCULAR | Status: DC | PRN

## 2012-12-03 MED ORDER — SODIUM CHLORIDE 0.9 % IJ SOLN
3.0000 mL | Freq: Two times a day (BID) | INTRAMUSCULAR | Status: DC
  Administered 2012-12-03 – 2012-12-04 (×3): 3 mL via INTRAVENOUS

## 2012-12-03 MED ORDER — ADULT MULTIVITAMIN W/MINERALS CH
1.0000 | ORAL_TABLET | Freq: Every day | ORAL | Status: DC
  Administered 2012-12-04 – 2012-12-05 (×2): 1 via ORAL
  Filled 2012-12-03 (×2): qty 1

## 2012-12-03 MED ORDER — ACETAMINOPHEN 325 MG PO TABS
650.0000 mg | ORAL_TABLET | Freq: Four times a day (QID) | ORAL | Status: DC | PRN
  Administered 2012-12-04: 650 mg via ORAL
  Filled 2012-12-03: qty 2

## 2012-12-03 MED ORDER — METOPROLOL TARTRATE 50 MG PO TABS
50.0000 mg | ORAL_TABLET | Freq: Two times a day (BID) | ORAL | Status: DC
  Administered 2012-12-03 – 2012-12-05 (×4): 50 mg via ORAL
  Filled 2012-12-03 (×5): qty 1

## 2012-12-03 MED ORDER — OXYCODONE HCL 5 MG PO TABS
5.0000 mg | ORAL_TABLET | ORAL | Status: DC | PRN
  Administered 2012-12-04: 5 mg via ORAL
  Filled 2012-12-03: qty 1

## 2012-12-03 MED ORDER — TAMSULOSIN HCL 0.4 MG PO CAPS
0.4000 mg | ORAL_CAPSULE | Freq: Every day | ORAL | Status: DC
  Administered 2012-12-04: 0.4 mg via ORAL
  Filled 2012-12-03 (×2): qty 1

## 2012-12-03 MED ORDER — ONDANSETRON HCL 4 MG/2ML IJ SOLN: 4.0000 mg | Freq: Four times a day (QID) | INTRAMUSCULAR | Status: DC | PRN

## 2012-12-03 MED ORDER — ASPIRIN 325 MG PO TABS
325.0000 mg | ORAL_TABLET | Freq: Once | ORAL | Status: AC
  Administered 2012-12-03: 325 mg via ORAL
  Filled 2012-12-03: qty 1

## 2012-12-03 MED ORDER — SODIUM CHLORIDE 0.9 % IJ SOLN: 3.0000 mL | Freq: Two times a day (BID) | INTRAMUSCULAR | Status: DC

## 2012-12-03 MED ORDER — SENNOSIDES-DOCUSATE SODIUM 8.6-50 MG PO TABS: 1.0000 | ORAL_TABLET | Freq: Every evening | ORAL | Status: DC | PRN

## 2012-12-03 MED ORDER — ASPIRIN EC 81 MG PO TBEC
81.0000 mg | DELAYED_RELEASE_TABLET | Freq: Every day | ORAL | Status: DC
  Administered 2012-12-04 – 2012-12-05 (×2): 81 mg via ORAL
  Filled 2012-12-03 (×2): qty 1

## 2012-12-03 MED ORDER — LEVOTHYROXINE SODIUM 50 MCG PO TABS
50.0000 ug | ORAL_TABLET | Freq: Every day | ORAL | Status: DC
  Administered 2012-12-04 – 2012-12-05 (×2): 50 ug via ORAL
  Filled 2012-12-03 (×3): qty 1

## 2012-12-03 MED ORDER — ACETAMINOPHEN 650 MG RE SUPP: 650.0000 mg | Freq: Four times a day (QID) | RECTAL | Status: DC | PRN

## 2012-12-03 MED ORDER — INFLUENZA VAC SPLIT QUAD 0.5 ML IM SUSP
0.5000 mL | INTRAMUSCULAR | Status: AC
  Administered 2012-12-04: 0.5 mL via INTRAMUSCULAR
  Filled 2012-12-03 (×2): qty 0.5

## 2012-12-03 MED ORDER — LOSARTAN POTASSIUM 50 MG PO TABS
50.0000 mg | ORAL_TABLET | Freq: Two times a day (BID) | ORAL | Status: DC
  Administered 2012-12-03 – 2012-12-05 (×4): 50 mg via ORAL
  Filled 2012-12-03 (×5): qty 1

## 2012-12-03 MED ORDER — THERA M PLUS PO TABS: 1.0000 | ORAL_TABLET | Freq: Every day | ORAL | Status: DC

## 2012-12-03 MED ORDER — CARBAMAZEPINE ER 200 MG PO TB12
300.0000 mg | ORAL_TABLET | Freq: Two times a day (BID) | ORAL | Status: DC
  Administered 2012-12-03 – 2012-12-05 (×4): 300 mg via ORAL
  Filled 2012-12-03 (×5): qty 1

## 2012-12-03 MED ORDER — SODIUM CHLORIDE 0.9 % IV SOLN: 250.0000 mL | INTRAVENOUS | Status: DC | PRN

## 2012-12-03 MED ORDER — ENOXAPARIN SODIUM 40 MG/0.4ML ~~LOC~~ SOLN
40.0000 mg | SUBCUTANEOUS | Status: DC
  Administered 2012-12-04: 40 mg via SUBCUTANEOUS
  Filled 2012-12-03 (×2): qty 0.4

## 2012-12-03 MED ORDER — ONDANSETRON HCL 4 MG PO TABS: 4.0000 mg | ORAL_TABLET | Freq: Four times a day (QID) | ORAL | Status: DC | PRN

## 2012-12-03 NOTE — ED Notes (Addendum)
Patient states that he has been hurting across his shoulder for 2-3 days. This morning he went to tie his long tie and "his hands would not work". He says that his eyes feel dry. Patient does not report any injury.

## 2012-12-03 NOTE — H&P (Signed)
[facility]          History and Physical    PCP:   [name], MD   Chief Complaint:  Loss of hand coordination  HPI: Patient is a pleasant 79 year old man with past medical history significant for coronary artery disease, hypertension, hyperlipidemia. He woke up today to go to church and while he was attempting to knot his tie he noticed that his hands were very uncoordinated and could not do it. He didn't think much about it and went to church. About 30 minutes after the initial event he was able to tie it without any incidence. His wife was present at time of the initial event does not note any gait imbalance or speech impediment. Patient never loss consciousness or felt confused. The ED has asked me to admit him for a potential TIA.   Allergies:  No Known Allergies    Past Medical History  Diagnosis Date  . Coronary artery disease     s/p MI tx with Cypher DES to pRCA in 4/04; LHC 4/04 in the setting of his MI: Proximal LAD 30%, ostial diagonal 40-50%, ostial OM1 30%, proximal RCA 95% which was the infarct vessel   ;    Echocardiogram 7/09: Normal LV function.  Nuclear study  March 2013 negative for ischemia  . Dyslipidemia   . Seizure disorder   . Hypothyroidism   . DDD (degenerative disc disease)   . Hx MRSA infection     left buttocks abscess  . Rotator cuff injury     chronic rotator cuff injury status post repair  . Hypertension   . Syncope   . Dizziness   . Meniere's disease     Status post shunt  . BPH (benign prostatic hyperplasia)     Past Surgical History  Procedure Laterality Date  . Coronary angioplasty with stent placement  07/14/2002    Stent to the right coronary artery  . Rotator cuff repair      for chronic rotator cuff injury  . Hernia repair  04/10/2008    scrotal hernia repair  . Endolymphatic shunt decompression  06/11/2009     Right endolymphatic sac decompression and shunt  placement    Prior to Admission  medications   Medication Sig Start Date End Date Taking? Authorizing Provider  aspirin EC 81 MG tablet Take 81 mg by mouth every morning.     Yes Historical Provider, MD  atorvastatin (LIPITOR) 40 MG tablet Take 1 tablet (40 mg total) by mouth daily. 06/07/12  Yes Donita Brooks, MD  carbamazepine (CARBATROL) 300 MG 12 hr capsule TAKE 1 CAPSULE BY MOUTH TWICE A DAY 08/21/12  Yes Donita Brooks, MD  cloNIDine (CATAPRES) 0.1 MG tablet Take 1 tablet (0.1 mg total) by mouth 2 (two) times daily. 06/26/12  Yes Donita Brooks, MD  diazepam (VALIUM) 2 MG tablet TAKE 1 TABLET BY MOUTH 2 TIMES DAILY 09/08/12  Yes Donita Brooks, MD  fluticasone [facility]) 50 MCG/ACT nasal spray Place 1 spray into the nose as needed.  05/12/11  Yes Historical Provider, MD  hydrochlorothiazide (HYDRODIURIL) 25 MG tablet Take 1 tablet (25 mg total) by mouth daily. 07/28/12  Yes Donita Brooks, MD  levothyroxine (SYNTHROID, LEVOTHROID) 25 MCG tablet TAKE 2 TABLETS EVERY DAY 08/28/12  Yes Donita Brooks, MD  losartan (COZAAR) 50 MG tablet Take 1 tablet (50 mg total) by mouth 2 (two) times daily. 04/17/12  Yes Rollene Rotunda, MD  meloxicam (MOBIC) 15 MG  tablet Take 1 tablet (15 mg total) by mouth daily. 10/25/12  Yes Donita Brooks, MD  metFORMIN (GLUCOPHAGE) 500 MG tablet Take 1 tablet (500 mg total) by mouth 2 (two) times daily with a meal. 06/07/12  Yes Donita Brooks, MD  metoprolol (LOPRESSOR) 50 MG tablet TAKE 1 TABLET TWICE DAILY 09/21/12  Yes Donita Brooks, MD  Multiple Vitamins-Minerals (MULTIVITAMINS THER. W/MINERALS) TABS Take 1 tablet by mouth at bedtime.     Yes Historical Provider, MD  silodosin (RAPAFLO) 8 MG CAPS capsule Take 8 mg by mouth daily with breakfast.    Yes Historical Provider, MD    Social History:  reports that he quit smoking about 51 years ago. He does not have any smokeless tobacco history on file. He reports that he does not drink alcohol or use illicit drugs.  Family History  Problem Relation  Age of Onset  . Heart attack Father 23  . Aneurysm Mother     brain aneurysm  . Coronary artery disease Brother     with CABG    Review of Systems:  Constitutional: Denies fever, chills, diaphoresis, appetite change and fatigue.  HEENT: Denies photophobia, eye pain, redness, hearing loss, ear pain, congestion, sore throat, rhinorrhea, sneezing, mouth sores, trouble swallowing, neck pain, neck stiffness and tinnitus.   Respiratory: Denies SOB, DOE, cough, chest tightness,  and wheezing.   Cardiovascular: Denies chest pain, palpitations and leg swelling.  Gastrointestinal: Denies nausea, vomiting, abdominal pain, diarrhea, constipation, blood in stool and abdominal distention.  Genitourinary: Denies dysuria, urgency, frequency, hematuria, flank pain and difficulty urinating.  Endocrine: Denies: hot or cold intolerance, sweats, changes in hair or nails, polyuria, polydipsia. Musculoskeletal: Denies myalgias, back pain, joint swelling, arthralgias and gait problem.  Skin: Denies pallor, rash and wound.  Neurological: Denies dizziness, seizures, syncope, weakness, light-headedness, numbness and headaches.  Hematological: Denies adenopathy. Easy bruising, personal or family bleeding history  Psychiatric/Behavioral: Denies suicidal ideation, mood changes, confusion, nervousness, sleep disturbance and agitation   Physical Exam: Blood pressure 161/106, pulse 94, temperature 98.3 F (36.8 C), temperature source Oral, resp. rate 18, SpO2 100.00%. General: Alert, awake, oriented x3, in no acute distress. HEENT: Normocephalic, atraumatic, pupils equal round and reactive to light, extraocular movements intact, wears corrective lenses. Neck: Supple, no JVD, no lymphadenopathy, no bruits, no goiter. Cardiovascular: Regular rate and rhythm, no murmurs, rubs or callus. Lungs: Clear to auscultation bilaterally. Abdomen: Obese, soft, nontender, nondistended, positive bowel sounds. Extremities: Trace  bilateral pitting edema, positive pedal pulses. Neurologic: Grossly intact and nonfocal.   Labs on Admission:  Results for orders placed during the hospital encounter of 12/03/12 (from the past 48 hour(s))  CBC     Status: None   Collection Time    12/03/12 12:54 PM      Result Value Range   WBC 8.3  4.0 - 10.5 K/uL   RBC 4.39  4.22 - 5.81 MIL/uL   Hemoglobin 13.6  13.0 - 17.0 g/dL   HCT 16.1  09.6 - 04.5 %   MCV 90.0  78.0 - 100.0 fL   MCH 31.0  26.0 - 34.0 pg   MCHC 34.4  30.0 - 36.0 g/dL   RDW 40.9  81.1 - 91.4 %   Platelets 288  150 - 400 K/uL  COMPREHENSIVE METABOLIC PANEL     Status: Abnormal   Collection Time    12/03/12 12:54 PM      Result Value Range   Sodium 133 (*) 135 - 145 mEq/L  Potassium 3.8  3.5 - 5.1 mEq/L   Chloride 97  96 - 112 mEq/L   CO2 25  19 - 32 mEq/L   Glucose, Bld 120 (*) 70 - 99 mg/dL   BUN 17  6 - 23 mg/dL   Creatinine, Ser 1.61  0.50 - 1.35 mg/dL   Calcium 9.3  8.4 - 09.6 mg/dL   Total Protein 6.9  6.0 - 8.3 g/dL   Albumin 3.9  3.5 - 5.2 g/dL   AST 17  0 - 37 U/L   ALT 13  0 - 53 U/L   Alkaline Phosphatase 72  39 - 117 U/L   Total Bilirubin 0.2 (*) 0.3 - 1.2 mg/dL   GFR calc non Af Amer 80 (*) >90 mL/min   GFR calc Af Amer >90  >90 mL/min   Comment: (NOTE)     The eGFR has been calculated using the CKD EPI equation.     This calculation has not been validated in all clinical situations.     eGFR's persistently <90 mL/min signify possible Chronic Kidney     Disease.  POCT I-STAT TROPONIN I     Status: None   Collection Time    12/03/12  1:07 PM      Result Value Range   Troponin i, poc 0.01  0.00 - 0.08 ng/mL   Comment 3            Comment: Due to the release kinetics of cTnI,     a negative result within the first hours     of the onset of symptoms does not rule out     myocardial infarction with certainty.     If myocardial infarction is still suspected,     repeat the test at appropriate intervals.    Radiological Exams on  Admission: Dg Chest 2 View  12/03/2012   *RADIOLOGY REPORT*  Clinical Data: Transit ischemic attack  CHEST - 2 VIEW  Comparison: CT scan of the chest 07/02/2011  Findings: Negative for airspace consolidation, pulmonary edema, pleural effusion or pneumothorax.  Cardiac and mediastinal contours are within normal limits.  Atherosclerotic calcifications noted in the transverse aorta.  No acute osseous abnormality.  IMPRESSION: No acute cardiopulmonary process.  Aortic atherosclerosis.   Original Report Authenticated By: Malachy Moan, M.D.   Ct Head Wo Contrast  12/03/2012   CLINICAL DATA:  Neck pain. Altered mental status. The patient is complaining at his hands are not working properly.  EXAM: CT HEAD WITHOUT CONTRAST  TECHNIQUE: Contiguous axial images were obtained from the base of the skull through the vertex without intravenous contrast.  COMPARISON:  MRI brain 07/14/2009. CT head without contrast 06/12/2009.  FINDINGS: Find and atrophy and diffuse white matter disease is similar to the prior exams. No acute cortical infarct, hemorrhage, or mass lesion is present. The ventricles are stable. No significant extra-axial fluid collection is present.  The paranasal sinuses and left mastoid air cells are clear. Postoperative changes are noted at the right mastoid.  IMPRESSION: 1. No acute intracranial abnormality or significant interval change. 2. Stable atrophy and diffuse white matter disease.   Electronically Signed   By: Gennette Pac   On: 12/03/2012 13:17    Assessment/Plan Principal Problem:   Loss of coordination Active Problems:   Coronary artery disease   Dyslipidemia   Hypertension   Loss of hand coordination -This was of both hands and did not lateralize. -Doubt this is related to TIA. -On review of his medications  I note that he is on Tegretol. When I asked the patient he states that about 30 years ago he had what was thought to be a seizure and has been maintained on Tegretol ever  since. -The description of today's events sound more like may be a partial seizure. -Will continue his Tegretol, have requested neurology consultation, Dr. Leroy Kennedy has graciously agreed to see him in consultation. -EEG has been ordered.  Hypertension -BP initially elevated on admission, has improved. -Continue home medications.  Hyperlipidemia -Continue statin.  Coronary artery disease -Stable, no chest pain, continue home medications.  DVT prophylaxis -Lovenox.  CODE STATUS -Full code.  Time Spent on Admission:  75 minutes.   Chaya Jan [facility] Pager: [phone] 12/03/2012, 3:12 PM

## 2012-12-03 NOTE — ED Provider Notes (Signed)
CSN: [id number]     Arrival date & time 12/03/12  1218 History   First MD Initiated Contact with Patient 12/03/12 1219     Chief Complaint  Patient presents with  . Neck Pain  . Altered Mental Status    Patient states his "hands would not work"   (Consider location/radiation/quality/duration/timing/severity/associated sxs/prior Treatment) HPI  [name] is a 79 y.o. male with past medical history significant for coronary artery disease dyslipidemia and hypertension complaining of pain in the upper thoracic area worsening over the course of the last 3 days the patient also had an episode where he found it difficult to tie his neck tie this a.m. patient denies any lateralization, states that this episode lasted for approximately 30 minutes. Describes it as his arms would not carry out the commands from his brain.  Patient denies any dysarthria, ataxia, unilateral weakness, change in vision (+ b/l eye dryness), chest pain, shortness of breath, abdominal pain, nausea vomiting, change in bowel or bladder habits, change in sensation. Patient normally takes low-dose baby aspirin that he hasn't had it today, he ran out a few days ago and has not been to the store to pick up more.  Past Medical History  Diagnosis Date  . Coronary artery disease     s/p MI tx with Cypher DES to pRCA in 4/04; LHC 4/04 in the setting of his MI: Proximal LAD 30%, ostial diagonal 40-50%, ostial OM1 30%, proximal RCA 95% which was the infarct vessel   ;    Echocardiogram 7/09: Normal LV function.  Nuclear study  March 2013 negative for ischemia  . Dyslipidemia   . Seizure disorder   . Hypothyroidism   . DDD (degenerative disc disease)   . Hx MRSA infection     left buttocks abscess  . Rotator cuff injury     chronic rotator cuff injury status post repair  . Hypertension   . Syncope   . Dizziness   . Meniere's disease     Status post shunt  . BPH (benign prostatic hyperplasia)    Past Surgical History   Procedure Laterality Date  . Coronary angioplasty with stent placement  07/14/2002    Stent to the right coronary artery  . Rotator cuff repair      for chronic rotator cuff injury  . Hernia repair  04/10/2008    scrotal hernia repair  . Endolymphatic shunt decompression  06/11/2009     Right endolymphatic sac decompression and shunt  placement   Family History  Problem Relation Age of Onset  . Heart attack Father 87  . Aneurysm Mother     brain aneurysm  . Coronary artery disease Brother     with CABG   History  Substance Use Topics  . Smoking status: Former Smoker    Quit date: 08/19/1961  . Smokeless tobacco: Not on file  . Alcohol Use: No    Review of Systems 10 systems reviewed and found to be negative, except as noted in the HPI  Allergies  Review of patient's allergies indicates no known allergies.  Home Medications   Current Outpatient Rx  Name  Route  Sig  Dispense  Refill  . aspirin EC 81 MG tablet   Oral   Take 81 mg by mouth every morning.           Marland Kitchen atorvastatin (LIPITOR) 40 MG tablet   Oral   Take 1 tablet (40 mg total) by mouth daily.   30 tablet  5   . carbamazepine (CARBATROL) 300 MG 12 hr capsule      TAKE 1 CAPSULE BY MOUTH TWICE A DAY   60 capsule   2   . cloNIDine (CATAPRES) 0.1 MG tablet   Oral   Take 1 tablet (0.1 mg total) by mouth 2 (two) times daily.   60 tablet   5   . diazepam (VALIUM) 2 MG tablet      TAKE 1 TABLET BY MOUTH 2 TIMES DAILY   60 tablet   2   . fluticasone (FLONASE) 50 MCG/ACT nasal spray   Nasal   Place 1 spray into the nose as needed.          . hydrochlorothiazide (HYDRODIURIL) 25 MG tablet   Oral   Take 1 tablet (25 mg total) by mouth daily.   30 tablet   5     Note dosage change - Please make pt aware - he wil ...   . hydrochlorothiazide (MICROZIDE) 12.5 MG capsule      TAKE 1 CAPSULE BY MOUTH EVERY DAY   30 capsule   5     Pt should be taking 25mg  qd   . levothyroxine (SYNTHROID,  LEVOTHROID) 25 MCG tablet      TAKE 2 TABLETS EVERY DAY   60 tablet   6   . losartan (COZAAR) 50 MG tablet   Oral   Take 1 tablet (50 mg total) by mouth 2 (two) times daily.   60 tablet   11   . meloxicam (MOBIC) 15 MG tablet   Oral   Take 1 tablet (15 mg total) by mouth daily.   30 tablet   0   . metFORMIN (GLUCOPHAGE) 500 MG tablet   Oral   Take 1 tablet (500 mg total) by mouth 2 (two) times daily with a meal.   60 tablet   5   . metoprolol (LOPRESSOR) 50 MG tablet      TAKE 1 TABLET TWICE DAILY   60 tablet   5   . Multiple Vitamins-Minerals (MULTIVITAMINS THER. W/MINERALS) TABS   Oral   Take 1 tablet by mouth at bedtime.           . silodosin (RAPAFLO) 8 MG CAPS capsule   Oral   Take 8 mg by mouth at bedtime.            BP 161/106  Pulse 94  Temp(Src) 98.3 F (36.8 C) (Oral)  Resp 18  SpO2 100% Physical Exam  Nursing note and vitals reviewed. Constitutional: He is oriented to person, place, and time. He appears well-developed and well-nourished. No distress.  HENT:  Head: Normocephalic.  Mouth/Throat: Oropharynx is clear and moist.  Eyes: Conjunctivae and EOM are normal. Pupils are equal, round, and reactive to light.  Neck: Normal range of motion. Neck supple. No JVD present.  Cardiovascular: Normal rate, regular rhythm and intact distal pulses.   Pulmonary/Chest: Effort normal and breath sounds normal. No stridor. No respiratory distress. He has no wheezes. He has no rales. He exhibits no tenderness.  Abdominal: Soft. Bowel sounds are normal. He exhibits no distension and no mass. There is no tenderness. There is no rebound and no guarding.  Musculoskeletal: Normal range of motion.  Neurological: He is alert and oriented to person, place, and time.  Cranial nerves III through XII intact, strength 5 out of 5x4 extremities, negative pronator drift, finger to nose and heel-to-shin coordinated, sensation intact to pinprick and light touch, gait  is  coordinated and Romberg is negative.    Skin: Skin is warm.  Psychiatric: He has a normal mood and affect.    ED Course  Procedures (including critical care time) Labs Review Labs Reviewed  COMPREHENSIVE METABOLIC PANEL - Abnormal; Notable for the following:    Sodium 133 (*)    Glucose, Bld 120 (*)    Total Bilirubin 0.2 (*)    GFR calc non Af Amer 80 (*)    All other components within normal limits  CBC  POCT I-STAT TROPONIN I   Imaging Review Dg Chest 2 View  12/03/2012   *RADIOLOGY REPORT*  Clinical Data: Transit ischemic attack  CHEST - 2 VIEW  Comparison: CT scan of the chest 07/02/2011  Findings: Negative for airspace consolidation, pulmonary edema, pleural effusion or pneumothorax.  Cardiac and mediastinal contours are within normal limits.  Atherosclerotic calcifications noted in the transverse aorta.  No acute osseous abnormality.  IMPRESSION: No acute cardiopulmonary process.  Aortic atherosclerosis.   Original Report Authenticated By: Malachy Moan, M.D.   Ct Head Wo Contrast  12/03/2012   CLINICAL DATA:  Neck pain. Altered mental status. The patient is complaining at his hands are not working properly.  EXAM: CT HEAD WITHOUT CONTRAST  TECHNIQUE: Contiguous axial images were obtained from the base of the skull through the vertex without intravenous contrast.  COMPARISON:  MRI brain 07/14/2009. CT head without contrast 06/12/2009.  FINDINGS: Find and atrophy and diffuse white matter disease is similar to the prior exams. No acute cortical infarct, hemorrhage, or mass lesion is present. The ventricles are stable. No significant extra-axial fluid collection is present.  The paranasal sinuses and left mastoid air cells are clear. Postoperative changes are noted at the right mastoid.  IMPRESSION: 1. No acute intracranial abnormality or significant interval change. 2. Stable atrophy and diffuse white matter disease.   Electronically Signed   By: Gennette Pac   On: 12/03/2012 13:17     Date: 12/03/2012  Rate: 81  Rhythm: normal sinus rhythm  QRS Axis: left  Intervals: normal  ST/T Wave abnormalities: normal  Conduction Disutrbances:nonspecific intraventricular conduction delay  Narrative Interpretation:   Old EKG Reviewed: unchanged  MDM   1. TIA (transient ischemic attack)   2. Coronary artery disease   3. Dyslipidemia   4. Hypertension     Filed Vitals:   12/03/12 1225 12/03/12 1232  BP: 161/106   Pulse: 94   Temp: 98.3 F (36.8 C) 98.3 F (36.8 C)  TempSrc: Oral   Resp: 18   SpO2: 100%      [name] is a 79 y.o. male with past medical history significant for high cholesterol, hypertension, coronary artery disease complaining of sensation of bilateral upper extremity discoordination this morning, now completely resolved. Neuro exam shows no focal abnormalities. ABDC2 score of 3. Patient is given a full dose aspirin. Patient also reports associated pain over the upper thoracic back worsening over the course last 2 days and dry eyes.   This is a shared visit with the attending physician who personally evaluated the patient and agrees with the care plan.   Will be admitted to a telemetry bed under the care of Dr. Lily Peer for TIA workup.   Medications  aspirin tablet 325 mg (325 mg Oral Given 12/03/12 1313)    Note: Portions of this report may have been transcribed using voice recognition software. Every effort was made to ensure accuracy; however, inadvertent computerized transcription errors may be present  Wynetta Emery, PA-C 12/03/12 1409  Wynetta Emery, PA-C 12/03/12 1410  Nicole Pisciotta, PA-C 12/03/12 1423

## 2012-12-03 NOTE — ED Notes (Signed)
Attempted to cal report. RN could not take at this time. Will call  Back when she is available

## 2012-12-03 NOTE — ED Notes (Signed)
Patient and wife both provided a cup of coffee. [name], [place] stated that it was okay for patient to eat and drink at this time.

## 2012-12-03 NOTE — Progress Notes (Signed)
Report received, patient arrived from ED via stretcher in stable condition without obvious neurological deficits. No complaints of pain. Oriented to room and plan of care.COLLINS, SUSAN K

## 2012-12-03 NOTE — Consult Note (Addendum)
Reason for Consult:Difficulty using hands Referring Physician: Hernandez-Acosta  CC: Difficulty using hands  HPI: [name] is an 79 y.o. male who reports that he awakened normal today.  While getting dressed for church was unable to do his tie.  He was able to do all other activities associated with dressing including buttoning his shirt.  Was able to eat and even drive to church.  He did not get lost or have difficulty using the controls.  Speech was normal.  Gait was normal.   Although it took three other tries at church he was finally able to do his tie.  He felt that this spell was unusual and presented for evaluation.  He has had no further problems during the day.    Past Medical History  Diagnosis Date  . Coronary artery disease     s/p MI tx with Cypher DES to pRCA in 4/04; LHC 4/04 in the setting of his MI: Proximal LAD 30%, ostial diagonal 40-50%, ostial OM1 30%, proximal RCA 95% which was the infarct vessel   ;    Echocardiogram 7/09: Normal LV function.  Nuclear study  March 2013 negative for ischemia  . Dyslipidemia   . Seizure disorder   . Hypothyroidism   . DDD (degenerative disc disease)   . Hx MRSA infection     left buttocks abscess  . Rotator cuff injury     chronic rotator cuff injury status post repair  . Hypertension   . Syncope   . Dizziness   . Meniere's disease     Status post shunt  . BPH (benign prostatic hyperplasia)     Past Surgical History  Procedure Laterality Date  . Coronary angioplasty with stent placement  07/14/2002    Stent to the right coronary artery  . Rotator cuff repair      for chronic rotator cuff injury  . Hernia repair  04/10/2008    scrotal hernia repair  . Endolymphatic shunt decompression  06/11/2009     Right endolymphatic sac decompression and shunt  placement    Family History  Problem Relation Age of Onset  . Heart attack Father 87  . Aneurysm Mother     brain aneurysm  . Coronary artery disease Brother      with CABG    Social History:  reports that he quit smoking about 51 years ago. He does not have any smokeless tobacco history on file. He reports that he does not drink alcohol or use illicit drugs.  No Known Allergies  Medications:  I have reviewed the patient's current medications. Prior to Admission:  Prescriptions prior to admission  Medication Sig Dispense Refill  . aspirin EC 81 MG tablet Take 81 mg by mouth every morning.        Marland Kitchen atorvastatin (LIPITOR) 40 MG tablet Take 1 tablet (40 mg total) by mouth daily.  30 tablet  5  . carbamazepine (CARBATROL) 300 MG 12 hr capsule TAKE 1 CAPSULE BY MOUTH TWICE A DAY  60 capsule  2  . cloNIDine (CATAPRES) 0.1 MG tablet Take 1 tablet (0.1 mg total) by mouth 2 (two) times daily.  60 tablet  5  . diazepam (VALIUM) 2 MG tablet TAKE 1 TABLET BY MOUTH 2 TIMES DAILY  60 tablet  2  . fluticasone (FLONASE) 50 MCG/ACT nasal spray Place 1 spray into the nose as needed.       . hydrochlorothiazide (HYDRODIURIL) 25 MG tablet Take 1 tablet (25 mg total) by mouth  daily.  30 tablet  5  . levothyroxine (SYNTHROID, LEVOTHROID) 25 MCG tablet TAKE 2 TABLETS EVERY DAY  60 tablet  6  . losartan (COZAAR) 50 MG tablet Take 1 tablet (50 mg total) by mouth 2 (two) times daily.  60 tablet  11  . meloxicam (MOBIC) 15 MG tablet Take 1 tablet (15 mg total) by mouth daily.  30 tablet  0  . metFORMIN (GLUCOPHAGE) 500 MG tablet Take 1 tablet (500 mg total) by mouth 2 (two) times daily with a meal.  60 tablet  5  . metoprolol (LOPRESSOR) 50 MG tablet TAKE 1 TABLET TWICE DAILY  60 tablet  5  . Multiple Vitamins-Minerals (MULTIVITAMINS THER. W/MINERALS) TABS Take 1 tablet by mouth at bedtime.        . silodosin (RAPAFLO) 8 MG CAPS capsule Take 8 mg by mouth daily with breakfast.        Scheduled: . [START ON 12/04/2012] aspirin EC  81 mg Oral Daily  . atorvastatin  40 mg Oral Daily  . carbamazepine  300 mg Oral BID  . cloNIDine  0.1 mg Oral BID  . diazepam  1 mg Oral BID   . [START ON 12/04/2012] enoxaparin (LOVENOX) injection  40 mg Subcutaneous Q24H  . [START ON 12/04/2012] hydrochlorothiazide  25 mg Oral Daily  . [START ON 12/04/2012] influenza vac split quadrivalent PF  0.5 mL Intramuscular Tomorrow-1000  . [START ON 12/04/2012] levothyroxine  50 mcg Oral QAC breakfast  . losartan  50 mg Oral BID  . metoprolol  50 mg Oral BID  . [START ON 12/04/2012] multivitamin with minerals  1 tablet Oral Daily  . sodium chloride  3 mL Intravenous Q12H  . sodium chloride  3 mL Intravenous Q12H  . [START ON 12/04/2012] tamsulosin  0.4 mg Oral QPC supper    ROS: History obtained from the patient  General ROS: negative for - chills, fatigue, fever, night sweats, weight gain or weight loss Psychological ROS: negative for - behavioral disorder, hallucinations, memory difficulties, mood swings or suicidal ideation Ophthalmic ROS: negative for - blurry vision, double vision, eye pain or loss of vision ENT ROS: negative for - epistaxis, nasal discharge, oral lesions, sore throat, tinnitus or vertigo Allergy and Immunology ROS: negative for - hives or itchy/watery eyes Hematological and Lymphatic ROS: negative for - bleeding problems, bruising or swollen lymph nodes Endocrine ROS: negative for - galactorrhea, hair pattern changes, polydipsia/polyuria or temperature intolerance Respiratory ROS: negative for - cough, hemoptysis, shortness of breath or wheezing Cardiovascular ROS: negative for - chest pain, dyspnea on exertion, edema or irregular heartbeat Gastrointestinal ROS: negative for - abdominal pain, diarrhea, hematemesis, nausea/vomiting or stool incontinence Genito-Urinary ROS: negative for - dysuria, hematuria, incontinence or urinary frequency/urgency Musculoskeletal ROS: negative for - joint swelling or muscular weakness Neurological ROS: as noted in HPI Dermatological ROS: negative for rash and skin lesion changes  Physical Examination: Blood pressure 163/85, pulse  67, temperature 98.1 F (36.7 C), temperature source Oral, resp. rate 16, height 5\' 8"  (1.727 m), weight 100.6 kg (221 lb 12.5 oz), SpO2 95.00%.  Neurologic Examination Mental Status: Alert, oriented, thought content appropriate.  Speech fluent without evidence of aphasia.  Able to follow 3 step commands without difficulty. Cranial Nerves: II: Discs flat bilaterally; Visual fields grossly normal, pupils equal, round, reactive to light and accommodation III,IV, VI: ptosis not present, extra-ocular motions intact bilaterally V,VII: smile symmetric, facial light touch sensation normal bilaterally VIII: hearing normal bilaterally IX,X: gag reflex present XI:  bilateral shoulder shrug XII: midline tongue extension Motor: Right : Upper extremity   5/5    Left:     Upper extremity   5/5  Lower extremity   5/5     Lower extremity   5/5 Tone and bulk:normal tone throughout; no atrophy noted Sensory: Pinprick and light touch intact throughout, bilaterally Deep Tendon Reflexes: symmetric throughout Plantars: Right: downgoing   Left: downgoing Cerebellar: normal finger-to-nose and normal heel-to-shin test Gait: not tested CV: pulses palpable throughout   Laboratory Studies:   Basic Metabolic Panel:  Recent Labs Lab 12/03/12 1254  NA 133*  K 3.8  CL 97  CO2 25  GLUCOSE 120*  BUN 17  CREATININE 0.89  CALCIUM 9.3    Liver Function Tests:  Recent Labs Lab 12/03/12 1254  AST 17  ALT 13  ALKPHOS 72  BILITOT 0.2*  PROT 6.9  ALBUMIN 3.9   No results found for this basename: LIPASE, AMYLASE,  in the last 168 hours No results found for this basename: AMMONIA,  in the last 168 hours  CBC:  Recent Labs Lab 12/03/12 1254  WBC 8.3  HGB 13.6  HCT 39.5  MCV 90.0  PLT 288    Cardiac Enzymes: No results found for this basename: CKTOTAL, CKMB, CKMBINDEX, TROPONINI,  in the last 168 hours  BNP: No components found with this basename: POCBNP,   CBG: No results found for  this basename: GLUCAP,  in the last 168 hours  Microbiology: Results for orders placed during the hospital encounter of 12/03/12  MRSA PCR SCREENING     Status: None   Collection Time    12/03/12  6:42 PM      Result Value Range Status   MRSA by PCR NEGATIVE  NEGATIVE Final   Comment:            The GeneXpert MRSA Assay (FDA     approved for NASAL specimens     only), is one component of a     comprehensive MRSA colonization     surveillance program. It is not     intended to diagnose MRSA     infection nor to guide or     monitor treatment for     MRSA infections.    Coagulation Studies: No results found for this basename: LABPROT, INR,  in the last 72 hours  Urinalysis: No results found for this basename: COLORURINE, APPERANCEUR, LABSPEC, PHURINE, GLUCOSEU, HGBUR, BILIRUBINUR, KETONESUR, PROTEINUR, UROBILINOGEN, NITRITE, LEUKOCYTESUR,  in the last 168 hours  Lipid Panel:     Component Value Date/Time   CHOL 195 07/19/2012 0830   TRIG 126 07/19/2012 0830   HDL 63 07/19/2012 0830   CHOLHDL 3.1 07/19/2012 0830   VLDL 25 07/19/2012 0830   LDLCALC 107* 07/19/2012 0830    HgbA1C:  Lab Results  Component Value Date   HGBA1C 6.5* 07/19/2012    Urine Drug Screen:   No results found for this basename: labopia, cocainscrnur, labbenz, amphetmu, thcu, labbarb    Alcohol Level: No results found for this basename: ETH,  in the last 168 hours  Other results: EKG: sinus rhythm at 81 bpm.  Imaging: Dg Chest 2 View  12/03/2012   *RADIOLOGY REPORT*  Clinical Data: Transit ischemic attack  CHEST - 2 VIEW  Comparison: CT scan of the chest 07/02/2011  Findings: Negative for airspace consolidation, pulmonary edema, pleural effusion or pneumothorax.  Cardiac and mediastinal contours are within normal limits.  Atherosclerotic calcifications noted in the transverse  aorta.  No acute osseous abnormality.  IMPRESSION: No acute cardiopulmonary process.  Aortic atherosclerosis.   Original Report  Authenticated By: Malachy Moan, M.D.   Ct Head Wo Contrast  12/03/2012   CLINICAL DATA:  Neck pain. Altered mental status. The patient is complaining at his hands are not working properly.  EXAM: CT HEAD WITHOUT CONTRAST  TECHNIQUE: Contiguous axial images were obtained from the base of the skull through the vertex without intravenous contrast.  COMPARISON:  MRI brain 07/14/2009. CT head without contrast 06/12/2009.  FINDINGS: Find and atrophy and diffuse white matter disease is similar to the prior exams. No acute cortical infarct, hemorrhage, or mass lesion is present. The ventricles are stable. No significant extra-axial fluid collection is present.  The paranasal sinuses and left mastoid air cells are clear. Postoperative changes are noted at the right mastoid.  IMPRESSION: 1. No acute intracranial abnormality or significant interval change. 2. Stable atrophy and diffuse white matter disease.   Electronically Signed   By: Gennette Pac   On: 12/03/2012 13:17     Assessment/Plan: 79 year old male presenting with an unusual episode when he was unable to tie a knot.  No other activities were affected.  Patient has no amnesia related to the event.  Symptom has now resolved.  This does not likely represent a TIA.  It also is not likely a seizure either.  There is a possibility, though, that it could have been an aura.  The patient has not had a seizure in many years.  Patient is on an aspirin a day.  Is on Tegretol for seizures.  Head CT reviewed and shows no acute changes.  Recommendations: 1.  Tegretol level 2.  EEG 3.  Continue ASA daily  Thana Farr, MD [facility] [phone] 12/03/2012, 11:50 PM

## 2012-12-03 NOTE — ED Provider Notes (Signed)
Medical screening examination/treatment/procedure(s) were conducted as a shared visit with non-physician practitioner(s) and myself.  I personally evaluated the patient during the encounter and agree with plan of care and physical exam.  Pt is a 79 y.o. yo male with a history of hypertension, hyperlipidemia, CAD who presents to the emergency department after an episode of difficulty tying his tie today. He states that he cannot coordinate either of his hands to tie his time which is something that he has done every day for many years. He states this lasted for approximately 1 hour and then resolve. Denies any numbness, focal weakness, slurred speech, facial droop. No prior history of CVA or TIA. He is not on anticoagulation. He is also complaining of a sore like pain around his neck for the past 2-3 days. No chest pain or shortness of breath. On exam, patient is hemodynamically stable and neurologically intact. Concern for possible TIA. Will obtain labs, CT scan, admit. Patient is comfortable with this plan.  Layla Maw Ward, DO 12/03/12 1339

## 2012-12-04 ENCOUNTER — Observation Stay (HOSPITAL_COMMUNITY)
Admit: 2012-12-04 | Discharge: 2012-12-04 | Disposition: A | Discharge status: Home / Self Care | Payer: Medicare Other | Attending: Internal Medicine | Admitting: Internal Medicine

## 2012-12-04 ENCOUNTER — Observation Stay (HOSPITAL_COMMUNITY): Payer: Medicare Other

## 2012-12-04 DIAGNOSIS — G459 Transient cerebral ischemic attack, unspecified: Secondary | ICD-10-CM | POA: Diagnosis not present

## 2012-12-04 DIAGNOSIS — I6350 Cerebral infarction due to unspecified occlusion or stenosis of unspecified cerebral artery: Secondary | ICD-10-CM | POA: Diagnosis not present

## 2012-12-04 DIAGNOSIS — F29 Unspecified psychosis not due to a substance or known physiological condition: Secondary | ICD-10-CM | POA: Diagnosis not present

## 2012-12-04 LAB — CARBAMAZEPINE LEVEL, TOTAL: Carbamazepine Lvl: 7 ug/mL (ref 4.0–12.0)

## 2012-12-04 LAB — CBC
HCT: 41.1 % (ref 39.0–52.0)
Hemoglobin: 13.5 g/dL (ref 13.0–17.0)
MCH: 30 pg (ref 26.0–34.0)
MCHC: 32.8 g/dL (ref 30.0–36.0)
MCV: 91.3 fL (ref 78.0–100.0)
Platelets: 250 10*3/uL (ref 150–400)
RBC: 4.5 MIL/uL (ref 4.22–5.81)
RDW: 14.1 % (ref 11.5–15.5)
WBC: 7.6 10*3/uL (ref 4.0–10.5)

## 2012-12-04 LAB — BASIC METABOLIC PANEL
BUN: 13 mg/dL (ref 6–23)
CO2: 30 mEq/L (ref 19–32)
Calcium: 9.4 mg/dL (ref 8.4–10.5)
Chloride: 98 mEq/L (ref 96–112)
Creatinine, Ser: 0.78 mg/dL (ref 0.50–1.35)
GFR calc Af Amer: >90 mL/min (ref >90)
GFR calc non Af Amer: 85 mL/min — ABNORMAL LOW (ref >90)
Glucose, Bld: 128 mg/dL — ABNORMAL HIGH (ref 70–99)
Potassium: 3.9 mEq/L (ref 3.5–5.1)
Sodium: 137 mEq/L (ref 135–145)

## 2012-12-04 NOTE — Procedures (Signed)
EEG report.  Brief clinical history:  79 years old male who sustained an isolated episode of transient confusion.  Technique: this is a 17 channel routine scalp EEG performed at the bedside with bipolar and monopolar montages arranged in accordance to the international 10/20 system of electrode placement. One channel was dedicated to EKG recording.  The study was performed during wakefulness, drowsiness, and stage 2 sleep. No activating procedures performed.  Description:In the wakeful state, the best background consisted of a medium amplitude, posterior dominant, well sustained, symmetric and reactive 10 Hz rhythm. Drowsiness demonstrated dropout of the alpha rhythm. Stage 2 sleep showed symmetric and synchronous sleep spindles without intermixed epileptiform discharges. No focal or generalized epileptiform discharges noted.  No slowing seen.  EKG showed sinus rhythm.  Impression: this is a normal awake and asleep EEG.  Please, be aware that a normal EEG does not exclude the possibility of epilepsy.  Clinical correlation is advised.  Wyatt Portela, MD

## 2012-12-04 NOTE — Progress Notes (Signed)
Offsite EEG completed at [facility]. 

## 2012-12-04 NOTE — Progress Notes (Signed)
Subjective: Patient seen and examined, admitted with transient episode where he could not tie the knot of his tie, he was able to carry out all the activities. He says he again tried to tie the knot and was able to get it on third try. Filed Vitals:   12/04/12 1300  BP: 116/62  Pulse: 71  Temp: 97.6 F (36.4 C)  Resp: 16    Chest: Clear Bilaterally Heart : S1S2 RRR Abdomen: Soft, nontender Ext : No edema Neuro: Alert, oriented x 3  A/P  Transient loss of motor skill ? TIA vs Seizure vs Dementia vs hypertensive encephalopathy  EEG is done, results are pending. Will obtain MRI brain to r/o subcortical dementia. Will obtain TSH, Vit b12, RPR. Carotid dopplers   Meredeth Ide [facility] [phone]

## 2012-12-05 ENCOUNTER — Encounter: Payer: Self-pay | Admitting: Family Medicine

## 2012-12-05 DIAGNOSIS — E785 Hyperlipidemia, unspecified: Secondary | ICD-10-CM | POA: Diagnosis not present

## 2012-12-05 DIAGNOSIS — R404 Transient alteration of awareness: Secondary | ICD-10-CM | POA: Diagnosis not present

## 2012-12-05 DIAGNOSIS — I779 Disorder of arteries and arterioles, unspecified: Secondary | ICD-10-CM | POA: Insufficient documentation

## 2012-12-05 DIAGNOSIS — I739 Peripheral vascular disease, unspecified: Secondary | ICD-10-CM

## 2012-12-05 DIAGNOSIS — I10 Essential (primary) hypertension: Secondary | ICD-10-CM | POA: Diagnosis not present

## 2012-12-05 LAB — RPR: RPR Ser Ql: NONREACTIVE

## 2012-12-05 LAB — VITAMIN B12: Vitamin B-12: 456 pg/mL (ref 211–911)

## 2012-12-05 LAB — TSH: TSH: 4.465 u[IU]/mL (ref 0.350–4.500)

## 2012-12-05 NOTE — Discharge Summary (Signed)
Physician Discharge Summary  [name] [name]:[id number] DOB: 10/17/1933 DOA: 12/03/2012  PCP: [name], MD  Admit date: 12/03/2012 Discharge date: 12/05/2012  Time spent: 55 minutes  Recommendations for Outpatient Follow-up:  1. *Follow up PCP in 2 weeks  Discharge Diagnoses:  Principal Problem:   Loss of coordination Active Problems:   Coronary artery disease   Dyslipidemia   Hypertension   Discharge Condition: Stable  Diet recommendation: Low salt diet  Filed Weights   12/03/12 1616  Weight: 100.6 kg (221 lb 12.5 oz)    History of present illness:  79 year old man with past medical history significant for coronary artery disease, hypertension, hyperlipidemia. He woke up today to go to church and while he was attempting to knot his tie he noticed that his hands were very uncoordinated and could not do it. He didn't think much about it and went to church. About 30 minutes after the initial event he was able to tie it without any incidence. His wife was present at time of the initial event does not note any gait imbalance or speech impediment. Patient never loss consciousness or felt confused. The ED has asked me to admit him for a potential TIA.      Hospital Course:  Transient loss of motor skill  Resolved EEG negative for seizures Carotid dopplers- negative MRI Brain - normal TSH 4.465 NL b12- 456 NL  Called and discussed with Neurology, will discharge home on aspirin. Will continue Tegretol. May be developing early signs of dementia.  Procedures: EEG Carotid dopplers  Consultations:  Neurology  Discharge Exam: Filed Vitals:   12/05/12 0559  BP: 125/53  Pulse: 71  Temp: 97.9 F (36.6 C)  Resp: 18    General: Appear in no acute distress Cardiovascular: S1s2 RRR Respiratory: Clear bilaterally Ext : No edema Discharge Instructions  Discharge Orders   Future Orders Complete By Expires   Diet - low sodium heart healthy  As directed     Increase activity slowly  As directed        Medication List         aspirin EC 81 MG tablet  Take 81 mg by mouth every morning.     atorvastatin 40 MG tablet  Commonly known as:  LIPITOR  Take 1 tablet (40 mg total) by mouth daily.     carbamazepine 300 MG 12 hr capsule  Commonly known as:  CARBATROL  TAKE 1 CAPSULE BY MOUTH TWICE A DAY     cloNIDine 0.1 MG tablet  Commonly known as:  CATAPRES  Take 1 tablet (0.1 mg total) by mouth 2 (two) times daily.     diazepam 2 MG tablet  Commonly known as:  VALIUM  TAKE 1 TABLET BY MOUTH 2 TIMES DAILY     fluticasone 50 MCG/ACT nasal spray  Commonly known as:  FLONASE  Place 1 spray into the nose as needed.     hydrochlorothiazide 25 MG tablet  Commonly known as:  HYDRODIURIL  Take 1 tablet (25 mg total) by mouth daily.     levothyroxine 25 MCG tablet  Commonly known as:  SYNTHROID, LEVOTHROID  TAKE 2 TABLETS EVERY DAY     losartan 50 MG tablet  Commonly known as:  COZAAR  Take 1 tablet (50 mg total) by mouth 2 (two) times daily.     meloxicam 15 MG tablet  Commonly known as:  MOBIC  Take 1 tablet (15 mg total) by mouth daily.     metFORMIN 500  MG tablet  Commonly known as:  GLUCOPHAGE  Take 1 tablet (500 mg total) by mouth 2 (two) times daily with a meal.     metoprolol 50 MG tablet  Commonly known as:  LOPRESSOR  TAKE 1 TABLET TWICE DAILY     multivitamins ther. w/minerals Tabs tablet  Take 1 tablet by mouth at bedtime.     silodosin 8 MG Caps capsule  Commonly known as:  RAPAFLO  Take 8 mg by mouth daily with breakfast.       No Known Allergies    The results of significant diagnostics from this hospitalization (including imaging, microbiology, ancillary and laboratory) are listed below for reference.    Significant Diagnostic Studies: Dg Chest 2 View  12/03/2012   *RADIOLOGY REPORT*  Clinical Data: Transit ischemic attack  CHEST - 2 VIEW  Comparison: CT scan of the chest 07/02/2011  Findings: Negative  for airspace consolidation, pulmonary edema, pleural effusion or pneumothorax.  Cardiac and mediastinal contours are within normal limits.  Atherosclerotic calcifications noted in the transverse aorta.  No acute osseous abnormality.  IMPRESSION: No acute cardiopulmonary process.  Aortic atherosclerosis.   Original Report Authenticated By: Malachy Moan, M.D.   Ct Head Wo Contrast  12/03/2012   CLINICAL DATA:  Neck pain. Altered mental status. The patient is complaining at his hands are not working properly.  EXAM: CT HEAD WITHOUT CONTRAST  TECHNIQUE: Contiguous axial images were obtained from the base of the skull through the vertex without intravenous contrast.  COMPARISON:  MRI brain 07/14/2009. CT head without contrast 06/12/2009.  FINDINGS: Find and atrophy and diffuse white matter disease is similar to the prior exams. No acute cortical infarct, hemorrhage, or mass lesion is present. The ventricles are stable. No significant extra-axial fluid collection is present.  The paranasal sinuses and left mastoid air cells are clear. Postoperative changes are noted at the right mastoid.  IMPRESSION: 1. No acute intracranial abnormality or significant interval change. 2. Stable atrophy and diffuse white matter disease.   Electronically Signed   By: Gennette Pac   On: 12/03/2012 13:17   [name]  12/04/2012   *RADIOLOGY REPORT*  Clinical Data: Confusion.  Rule out CVA  MRI HEAD WITHOUT CONTRAST  Technique:  Multiplanar, multiecho pulse sequences of the brain and surrounding structures were obtained according to standard protocol without intravenous contrast.  Comparison: CT 12/03/2012  Findings: Mild atrophy.  Negative for acute infarct.  Minimal chronic microvascular ischemic change in the periventricular white matter.  Negative for mass or edema.  Negative for hemorrhage.  No shift of the midline structures.  Paranasal sinuses are clear.  Prior mastoidectomy on the right.  IMPRESSION: No acute  abnormality.   Original Report Authenticated By: Janeece Riggers, M.D.    Microbiology: Recent Results (from the past 240 hour(s))  MRSA PCR SCREENING     Status: None   Collection Time    12/03/12  6:42 PM      Result Value Range Status   MRSA by PCR NEGATIVE  NEGATIVE Final   Comment:            The GeneXpert MRSA Assay (FDA     approved for NASAL specimens     only), is one component of a     comprehensive MRSA colonization     surveillance program. It is not     intended to diagnose MRSA     infection nor to guide or     monitor treatment for  MRSA infections.     Labs: Basic Metabolic Panel:  Recent Labs Lab 12/03/12 1254 12/04/12 0501  NA 133* 137  K 3.8 3.9  CL 97 98  CO2 25 30  GLUCOSE 120* 128*  BUN 17 13  CREATININE 0.89 0.78  CALCIUM 9.3 9.4   Liver Function Tests:  Recent Labs Lab 12/03/12 1254  AST 17  ALT 13  ALKPHOS 72  BILITOT 0.2*  PROT 6.9  ALBUMIN 3.9   No results found for this basename: LIPASE, AMYLASE,  in the last 168 hours No results found for this basename: AMMONIA,  in the last 168 hours CBC:  Recent Labs Lab 12/03/12 1254 12/04/12 0501  WBC 8.3 7.6  HGB 13.6 13.5  HCT 39.5 41.1  MCV 90.0 91.3  PLT 288 250   Cardiac Enzymes: No results found for this basename: CKTOTAL, CKMB, CKMBINDEX, TROPONINI,  in the last 168 hours BNP: BNP (last 3 results) No results found for this basename: PROBNP,  in the last 8760 hours CBG: No results found for this basename: GLUCAP,  in the last 168 hours     Signed:  LAMA,GAGAN S  [facility] 12/05/2012, 11:12 AM

## 2012-12-05 NOTE — Progress Notes (Signed)
*  PRELIMINARY RESULTS* Vascular Ultrasound Carotid Duplex (Doppler) has been completed.  Preliminary findings: Right = 1-39% ICA stenosis. Left = 40-59% ICA stenosis.  Due to heavy calcific plaque with acoustic shadowing, higher velocities could be obscured bilaterally.     Farrel Demark, RDMS, RVT  12/05/2012, 8:58 AM

## 2012-12-11 ENCOUNTER — Ambulatory Visit (INDEPENDENT_AMBULATORY_CARE_PROVIDER_SITE_OTHER): Payer: Medicare Other | Admitting: Family Medicine

## 2012-12-11 ENCOUNTER — Encounter: Payer: Self-pay | Admitting: Family Medicine

## 2012-12-11 ENCOUNTER — Other Ambulatory Visit: Payer: Self-pay | Admitting: Family Medicine

## 2012-12-11 VITALS — BP 118/70 | HR 80 | Temp 98.2°F | Resp 20 | Wt 228.0 lb

## 2012-12-11 DIAGNOSIS — G459 Transient cerebral ischemic attack, unspecified: Secondary | ICD-10-CM

## 2012-12-11 DIAGNOSIS — Z09 Encounter for follow-up examination after completed treatment for conditions other than malignant neoplasm: Secondary | ICD-10-CM | POA: Diagnosis not present

## 2012-12-11 MED ORDER — ATORVASTATIN CALCIUM 40 MG PO TABS: 80.0000 mg | ORAL_TABLET | Freq: Every day | ORAL | Status: DC

## 2012-12-11 MED ORDER — CLOPIDOGREL BISULFATE 75 MG PO TABS: 75.0000 mg | ORAL_TABLET | Freq: Every day | ORAL | Status: DC

## 2012-12-11 NOTE — Telephone Encounter (Signed)
ok 

## 2012-12-11 NOTE — Telephone Encounter (Signed)
?   OK to Refill  

## 2012-12-11 NOTE — Progress Notes (Signed)
Subjective:    Patient ID: [name], male    DOB: Feb 11, 1934, 79 y.o.   MRN: [id number]  HPI  Patient was admitted from 9/14-9/16 with suspected TIA after experiencing a transient loss of coordination in his hands.  I reviewed the hospital discharge summary, MRI, and caroitd dopplers.  Patient was discharged on aspirin 81 mg poqday.  MRI HEAD WITHOUT CONTRAST  Technique: Multiplanar, multiecho pulse sequences of the brain and  surrounding structures were obtained according to standard protocol  without intravenous contrast.  Comparison: CT 12/03/2012  Findings: Mild atrophy.  Negative for acute infarct. Minimal chronic microvascular ischemic  change in the periventricular white matter.  Negative for mass or edema. Negative for hemorrhage. No shift of  the midline structures.  Paranasal sinuses are clear. Prior mastoidectomy on the right.  IMPRESSION:  No acute abnormality.  Carotid Doppler:  - The vertebral arteries appear patent with antegrade flow. - Findings consistent with 1-39 percent stenosis involving the right internal carotid artery. - Findings consistent with 40 - 59 percent stenosis involving the left internal carotid artery. - Findings consistent with moderate stenosis involving the left external carotid artery. - Due to heavy calcific plaque with acoustic shadowing, higher velocities may be obscured bilaterally. - ICA/CCA ratio. right =0.71. left = 1.02.  His most recent HgA1c was 6.5 in April and LDL was 107 in April.  In talking with the patient, he states that he did not lose control of his hands but that he got confused and was unable to tie his necktie.  He could simply not remember how to tie the tie. This was very concerning to the patient because he has done this thousand times in his life.  He was not slurring his speech. He did not experience a facial droop. He did not experience any weakness or numbness in his extremities. 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 He states that he did not have trouble speaking. He was able to remember his sermon perfectly. No one noticed any confusion or disorientation while he was speaking. Past Medical History  Diagnosis Date  . Coronary artery disease     s/p MI tx with Cypher DES to pRCA in 4/04; LHC 4/04 in the setting of his MI: Proximal LAD 30%, ostial diagonal 40-50%, ostial OM1 30%, proximal RCA 95% which was the infarct vessel   ;    Echocardiogram 7/09: Normal LV function.  Nuclear study  March 2013 negative for ischemia  . Dyslipidemia   . Seizure disorder   . Hypothyroidism   . DDD (degenerative disc disease)   . Hx MRSA infection     left buttocks abscess  . Rotator cuff injury     chronic rotator cuff injury status post repair  . Hypertension   . Syncope   . Dizziness   . Meniere's disease     Status post shunt  . BPH (benign prostatic hyperplasia)   . Stenosis of left internal carotid artery     40-59% on carotid doppler 2014   Past Surgical History  Procedure Laterality Date  . Coronary angioplasty with stent placement  07/14/2002    Stent to the right coronary artery  . Rotator cuff repair      for chronic rotator cuff injury  . Hernia repair  04/10/2008    scrotal hernia repair  . Endolymphatic shunt decompression  06/11/2009     Right endolymphatic sac decompression and shunt  placement   Current Outpatient  Prescriptions on File Prior to Visit  Medication Sig Dispense Refill  . aspirin EC 81 MG tablet Take 81 mg by mouth every morning.        Marland Kitchen atorvastatin (LIPITOR) 40 MG tablet Take 1 tablet (40 mg total) by mouth daily.  30 tablet  5  . carbamazepine (CARBATROL) 300 MG 12 hr capsule TAKE 1 CAPSULE BY MOUTH TWICE A DAY  60 capsule  2  . cloNIDine (CATAPRES) 0.1 MG tablet Take 1 tablet (0.1 mg total) by mouth 2 (two) times daily.  60 tablet  5  . diazepam (VALIUM) 2 MG tablet TAKE 1 TABLET BY MOUTH 2 TIMES DAILY  60 tablet  2  . fluticasone  (FLONASE) 50 MCG/ACT nasal spray Place 1 spray into the nose as needed.       . hydrochlorothiazide (HYDRODIURIL) 25 MG tablet Take 1 tablet (25 mg total) by mouth daily.  30 tablet  5  . levothyroxine (SYNTHROID, LEVOTHROID) 25 MCG tablet TAKE 2 TABLETS EVERY DAY  60 tablet  6  . losartan (COZAAR) 50 MG tablet Take 1 tablet (50 mg total) by mouth 2 (two) times daily.  60 tablet  11  . meloxicam (MOBIC) 15 MG tablet Take 1 tablet (15 mg total) by mouth daily.  30 tablet  0  . metFORMIN (GLUCOPHAGE) 500 MG tablet Take 1 tablet (500 mg total) by mouth 2 (two) times daily with a meal.  60 tablet  5  . metoprolol (LOPRESSOR) 50 MG tablet TAKE 1 TABLET TWICE DAILY  60 tablet  5  . Multiple Vitamins-Minerals (MULTIVITAMINS THER. W/MINERALS) TABS Take 1 tablet by mouth at bedtime.        . silodosin (RAPAFLO) 8 MG CAPS capsule Take 8 mg by mouth daily with breakfast.        No current facility-administered medications on file prior to visit.   No Known Allergies History   Social History  . Marital Status: Married    Spouse Name: N/A    Number of Children: N/A  . Years of Education: N/A   Occupational History  . Not on file.   Social History Main Topics  . Smoking status: Former Smoker    Quit date: 08/19/1961  . Smokeless tobacco: Not on file  . Alcohol Use: No  . Drug Use: No  . Sexual Activity: Not on file   Other Topics Concern  . Not on file   Social History Narrative  . No narrative on file     Review of Systems  All other systems reviewed and are negative.       Objective:   Physical Exam  Vitals reviewed. Constitutional: He is oriented to person, place, and time. He appears well-developed and well-nourished.  Neck: Neck supple. No JVD present. No thyromegaly present.  Cardiovascular: Normal rate, regular rhythm, normal heart sounds and intact distal pulses.  Exam reveals no gallop and no friction rub.   No murmur heard. Pulmonary/Chest: Effort normal and breath  sounds normal. No respiratory distress. He has no wheezes. He has no rales. He exhibits no tenderness.  Abdominal: Soft. Bowel sounds are normal. He exhibits no distension and no mass. There is no tenderness. There is no rebound and no guarding.  Musculoskeletal: He exhibits no edema.  Lymphadenopathy:    He has no cervical adenopathy.  Neurological: He is alert and oriented to person, place, and time. He has normal reflexes. He displays normal reflexes. No cranial nerve deficit. Coordination normal.  Skin: Skin  is warm. No rash noted. No erythema. No pallor.          Assessment & Plan:  Hospital discharge follow-up  TIA (transient ischemic attack) - Plan: atorvastatin (LIPITOR) 40 MG tablet, clopidogrel (PLAVIX) 75 MG tablet  Differential diagnosis includes TIA versus early signs of dementia. The patient has not experienced any other symptoms of dementia. 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  I feel it is more likely that he experienced a TIA. The patient was taking 81 mg of aspirin prior to the TIA. Therefore I am going to switch his aspirin to Plavix 75 mg by mouth daily. We will need to monitor the stenosis in his left internal carotid artery but at the present time it does not require surgical intervention. His blood pressure today is well controlled. His hemoglobin A1c is well controlled. I will increase his Lipitor from 40 mg to 80 mg to try to achieve a goal LDL less than 70 given his history of coronary artery disease, his carotid artery stenosis, and his recent TIA. We could consider switching to Crestor if this is ineffective. Recheck CMP, fasting lipid panel, hemoglobin A1c in 3 months. Today the patient's Mini-Mental Status exam is 30 over 30.

## 2012-12-13 ENCOUNTER — Other Ambulatory Visit: Payer: Self-pay | Admitting: Family Medicine

## 2012-12-18 DIAGNOSIS — H6120 Impacted cerumen, unspecified ear: Secondary | ICD-10-CM | POA: Diagnosis not present

## 2012-12-21 ENCOUNTER — Telehealth: Payer: Self-pay | Admitting: Family Medicine

## 2012-12-21 MED ORDER — NITROGLYCERIN 0.4 MG SL SUBL: 0.4000 mg | SUBLINGUAL_TABLET | SUBLINGUAL | Status: DC | PRN

## 2012-12-21 NOTE — Telephone Encounter (Signed)
Nitrostat 0.4 mg (there are no directions on the fax)

## 2012-12-21 NOTE — Telephone Encounter (Signed)
Rx Refilled  

## 2012-12-25 ENCOUNTER — Other Ambulatory Visit: Payer: Self-pay | Admitting: Family Medicine

## 2012-12-27 ENCOUNTER — Other Ambulatory Visit: Payer: Self-pay | Admitting: Family Medicine

## 2013-01-05 ENCOUNTER — Telehealth: Payer: Self-pay | Admitting: Family Medicine

## 2013-01-05 NOTE — Telephone Encounter (Signed)
Pharmacy called and told OK for him to receive refill early

## 2013-01-05 NOTE — Telephone Encounter (Signed)
ok 

## 2013-01-05 NOTE — Telephone Encounter (Signed)
Patient is requesting  that his Diazepam 2 mg be refilled 5 days early his going out of town on Sunday. Pharmacy calling to find out if they can go ahead and fill it early.    [facility]

## 2013-01-29 ENCOUNTER — Emergency Department (HOSPITAL_COMMUNITY): Payer: Medicare Other

## 2013-01-29 ENCOUNTER — Inpatient Hospital Stay (HOSPITAL_COMMUNITY)
Admission: EM | Admit: 2013-01-29 | Discharge: 2013-02-01 | DRG: 286 | Disposition: A | Discharge status: Rehabilitation Facility | Payer: Medicare Other | Attending: Internal Medicine | Admitting: Internal Medicine

## 2013-01-29 ENCOUNTER — Encounter (HOSPITAL_COMMUNITY): Payer: Self-pay | Admitting: Emergency Medicine

## 2013-01-29 DIAGNOSIS — G40909 Epilepsy, unspecified, not intractable, without status epilepticus: Secondary | ICD-10-CM | POA: Diagnosis present

## 2013-01-29 DIAGNOSIS — Z5189 Encounter for other specified aftercare: Secondary | ICD-10-CM | POA: Diagnosis not present

## 2013-01-29 DIAGNOSIS — E039 Hypothyroidism, unspecified: Secondary | ICD-10-CM | POA: Diagnosis present

## 2013-01-29 DIAGNOSIS — I6529 Occlusion and stenosis of unspecified carotid artery: Secondary | ICD-10-CM | POA: Diagnosis present

## 2013-01-29 DIAGNOSIS — I10 Essential (primary) hypertension: Secondary | ICD-10-CM | POA: Diagnosis not present

## 2013-01-29 DIAGNOSIS — IMO0001 Reserved for inherently not codable concepts without codable children: Secondary | ICD-10-CM | POA: Diagnosis not present

## 2013-01-29 DIAGNOSIS — R1310 Dysphagia, unspecified: Secondary | ICD-10-CM | POA: Diagnosis not present

## 2013-01-29 DIAGNOSIS — R0789 Other chest pain: Secondary | ICD-10-CM | POA: Diagnosis not present

## 2013-01-29 DIAGNOSIS — G8190 Hemiplegia, unspecified affecting unspecified side: Secondary | ICD-10-CM | POA: Diagnosis not present

## 2013-01-29 DIAGNOSIS — I252 Old myocardial infarction: Secondary | ICD-10-CM

## 2013-01-29 DIAGNOSIS — R079 Chest pain, unspecified: Secondary | ICD-10-CM | POA: Diagnosis not present

## 2013-01-29 DIAGNOSIS — H8109 Meniere's disease, unspecified ear: Secondary | ICD-10-CM | POA: Diagnosis present

## 2013-01-29 DIAGNOSIS — R5381 Other malaise: Secondary | ICD-10-CM | POA: Diagnosis not present

## 2013-01-29 DIAGNOSIS — I6340 Cerebral infarction due to embolism of unspecified cerebral artery: Secondary | ICD-10-CM | POA: Diagnosis not present

## 2013-01-29 DIAGNOSIS — I6522 Occlusion and stenosis of left carotid artery: Secondary | ICD-10-CM

## 2013-01-29 DIAGNOSIS — IMO0002 Reserved for concepts with insufficient information to code with codable children: Secondary | ICD-10-CM | POA: Diagnosis not present

## 2013-01-29 DIAGNOSIS — I200 Unstable angina: Secondary | ICD-10-CM | POA: Diagnosis not present

## 2013-01-29 DIAGNOSIS — I639 Cerebral infarction, unspecified: Secondary | ICD-10-CM

## 2013-01-29 DIAGNOSIS — I2510 Atherosclerotic heart disease of native coronary artery without angina pectoris: Secondary | ICD-10-CM

## 2013-01-29 DIAGNOSIS — Z8673 Personal history of transient ischemic attack (TIA), and cerebral infarction without residual deficits: Secondary | ICD-10-CM | POA: Diagnosis not present

## 2013-01-29 DIAGNOSIS — I517 Cardiomegaly: Secondary | ICD-10-CM | POA: Diagnosis not present

## 2013-01-29 DIAGNOSIS — R4789 Other speech disturbances: Secondary | ICD-10-CM | POA: Diagnosis not present

## 2013-01-29 DIAGNOSIS — E1142 Type 2 diabetes mellitus with diabetic polyneuropathy: Secondary | ICD-10-CM | POA: Diagnosis not present

## 2013-01-29 DIAGNOSIS — I6350 Cerebral infarction due to unspecified occlusion or stenosis of unspecified cerebral artery: Secondary | ICD-10-CM | POA: Diagnosis not present

## 2013-01-29 DIAGNOSIS — E785 Hyperlipidemia, unspecified: Secondary | ICD-10-CM

## 2013-01-29 DIAGNOSIS — E669 Obesity, unspecified: Secondary | ICD-10-CM

## 2013-01-29 DIAGNOSIS — T82897A Other specified complication of cardiac prosthetic devices, implants and grafts, initial encounter: Secondary | ICD-10-CM | POA: Diagnosis not present

## 2013-01-29 DIAGNOSIS — E119 Type 2 diabetes mellitus without complications: Secondary | ICD-10-CM | POA: Diagnosis present

## 2013-01-29 DIAGNOSIS — R278 Other lack of coordination: Secondary | ICD-10-CM

## 2013-01-29 DIAGNOSIS — R209 Unspecified disturbances of skin sensation: Secondary | ICD-10-CM | POA: Diagnosis not present

## 2013-01-29 DIAGNOSIS — R569 Unspecified convulsions: Secondary | ICD-10-CM | POA: Diagnosis not present

## 2013-01-29 DIAGNOSIS — K219 Gastro-esophageal reflux disease without esophagitis: Secondary | ICD-10-CM | POA: Diagnosis present

## 2013-01-29 DIAGNOSIS — Y849 Medical procedure, unspecified as the cause of abnormal reaction of the patient, or of later complication, without mention of misadventure at the time of the procedure: Secondary | ICD-10-CM | POA: Diagnosis present

## 2013-01-29 DIAGNOSIS — I6330 Cerebral infarction due to thrombosis of unspecified cerebral artery: Secondary | ICD-10-CM | POA: Diagnosis not present

## 2013-01-29 LAB — BASIC METABOLIC PANEL
BUN: 18 mg/dL (ref 6–23)
CO2: 28 mEq/L (ref 19–32)
Calcium: 10.3 mg/dL (ref 8.4–10.5)
Chloride: 96 mEq/L (ref 96–112)
Creatinine, Ser: 0.96 mg/dL (ref 0.50–1.35)
GFR calc Af Amer: >90 mL/min (ref >90)
GFR calc non Af Amer: 78 mL/min — ABNORMAL LOW (ref >90)
Glucose, Bld: 100 mg/dL — ABNORMAL HIGH (ref 70–99)
Potassium: 3.7 mEq/L (ref 3.5–5.1)
Sodium: 136 mEq/L (ref 135–145)

## 2013-01-29 LAB — CBC
HCT: 42.4 % (ref 39.0–52.0)
Hemoglobin: 14.7 g/dL (ref 13.0–17.0)
MCH: 31.3 pg (ref 26.0–34.0)
MCHC: 34.7 g/dL (ref 30.0–36.0)
MCV: 90.4 fL (ref 78.0–100.0)
Platelets: 287 10*3/uL (ref 150–400)
RBC: 4.69 MIL/uL (ref 4.22–5.81)
RDW: 13.9 % (ref 11.5–15.5)
WBC: 13.8 10*3/uL — ABNORMAL HIGH (ref 4.0–10.5)

## 2013-01-29 LAB — POCT I-STAT TROPONIN I: Troponin i, poc: 0.02 ng/mL (ref 0.00–0.08)

## 2013-01-29 MED ORDER — HEPARIN SODIUM (PORCINE) 5000 UNIT/ML IJ SOLN
5000.0000 [IU] | Freq: Three times a day (TID) | INTRAMUSCULAR | Status: DC
  Administered 2013-01-30 – 2013-02-01 (×7): 5000 [IU] via SUBCUTANEOUS
  Filled 2013-01-29 (×10): qty 1

## 2013-01-29 MED ORDER — INSULIN ASPART 100 UNIT/ML ~~LOC~~ SOLN: 0.0000 [IU] | SUBCUTANEOUS | Status: DC

## 2013-01-29 MED ORDER — HYDROCHLOROTHIAZIDE 25 MG PO TABS
25.0000 mg | ORAL_TABLET | Freq: Every day | ORAL | Status: DC
  Administered 2013-01-31 – 2013-02-01 (×2): 25 mg via ORAL
  Filled 2013-01-29 (×3): qty 1

## 2013-01-29 MED ORDER — METOPROLOL TARTRATE 50 MG PO TABS
50.0000 mg | ORAL_TABLET | Freq: Two times a day (BID) | ORAL | Status: DC
  Administered 2013-01-30 – 2013-02-01 (×6): 50 mg via ORAL
  Filled 2013-01-29 (×7): qty 1

## 2013-01-29 MED ORDER — CARBAMAZEPINE ER 300 MG PO CP12: 300.0000 mg | ORAL_CAPSULE | Freq: Two times a day (BID) | ORAL | Status: DC

## 2013-01-29 MED ORDER — NITROGLYCERIN 0.4 MG SL SUBL: 0.4000 mg | SUBLINGUAL_TABLET | SUBLINGUAL | Status: DC | PRN

## 2013-01-29 MED ORDER — ATORVASTATIN CALCIUM 80 MG PO TABS
80.0000 mg | ORAL_TABLET | Freq: Every day | ORAL | Status: DC
  Administered 2013-01-30 – 2013-02-01 (×3): 80 mg via ORAL
  Filled 2013-01-29 (×3): qty 1

## 2013-01-29 MED ORDER — CLONIDINE HCL 0.2 MG PO TABS
0.2000 mg | ORAL_TABLET | Freq: Every day | ORAL | Status: DC
  Administered 2013-01-30 – 2013-02-01 (×3): 0.2 mg via ORAL
  Filled 2013-01-29 (×3): qty 1

## 2013-01-29 MED ORDER — ACETAMINOPHEN 325 MG PO TABS
650.0000 mg | ORAL_TABLET | ORAL | Status: DC | PRN
Start: 2013-01-29 — End: 2013-02-01

## 2013-01-29 MED ORDER — ONDANSETRON HCL 4 MG/2ML IJ SOLN: 4.0000 mg | Freq: Four times a day (QID) | INTRAMUSCULAR | Status: DC | PRN

## 2013-01-29 MED ORDER — ASPIRIN EC 325 MG PO TBEC
325.0000 mg | DELAYED_RELEASE_TABLET | Freq: Once | ORAL | Status: AC
  Administered 2013-01-29: 325 mg via ORAL
  Filled 2013-01-29: qty 1

## 2013-01-29 MED ORDER — LEVOTHYROXINE SODIUM 50 MCG PO TABS
50.0000 ug | ORAL_TABLET | Freq: Every day | ORAL | Status: DC
  Administered 2013-01-30 – 2013-02-01 (×3): 50 ug via ORAL
  Filled 2013-01-29 (×4): qty 1

## 2013-01-29 MED ORDER — TAMSULOSIN HCL 0.4 MG PO CAPS
0.4000 mg | ORAL_CAPSULE | Freq: Every day | ORAL | Status: DC
  Administered 2013-01-30 – 2013-02-01 (×3): 0.4 mg via ORAL
  Filled 2013-01-29 (×4): qty 1

## 2013-01-29 MED ORDER — FLUTICASONE PROPIONATE 50 MCG/ACT NA SUSP
1.0000 | Freq: Every day | NASAL | Status: DC | PRN
  Filled 2013-01-29: qty 16

## 2013-01-29 MED ORDER — DIAZEPAM 2 MG PO TABS
2.0000 mg | ORAL_TABLET | Freq: Two times a day (BID) | ORAL | Status: DC
  Administered 2013-01-30 – 2013-02-01 (×3): 2 mg via ORAL
  Filled 2013-01-29 (×3): qty 1

## 2013-01-29 MED ORDER — LOSARTAN POTASSIUM 50 MG PO TABS
50.0000 mg | ORAL_TABLET | Freq: Two times a day (BID) | ORAL | Status: DC
  Administered 2013-01-30 – 2013-02-01 (×6): 50 mg via ORAL
  Filled 2013-01-29 (×7): qty 1

## 2013-01-29 MED ORDER — CLOPIDOGREL BISULFATE 75 MG PO TABS
75.0000 mg | ORAL_TABLET | Freq: Every day | ORAL | Status: DC
  Administered 2013-01-30 – 2013-02-01 (×3): 75 mg via ORAL
  Filled 2013-01-29 (×3): qty 1

## 2013-01-29 MED ORDER — NITROGLYCERIN 0.4 MG SL SUBL
0.4000 mg | SUBLINGUAL_TABLET | SUBLINGUAL | Status: DC | PRN
  Administered 2013-01-29: 0.4 mg via SUBLINGUAL

## 2013-01-29 NOTE — H&P (Signed)
[facility] History and Physical  MORITZ LEVER ZOX:[id number] DOB: 02-Aug-1933 DOA: 01/29/2013  Referring physician: ED PCP: [name], MD  Chief Complaint: Chest pain  HPI: [name] is a 79 y.o. male who presents to the ED with L sided chest pain.  Symptoms onset after raking leaves out of a bag from his lawn mower earlier today.  Symptoms are described as a pressure like sensation with radiation to his left shoulder and feel quite similar to chest pain he had back in 2004 during an MI, (which ended with a cardiac cath and a stent in his RCA at that time).  Has had no cardiac caths since that 2004 episode, did have a stress test in March of last year which was normal.  Review of Systems: 12 systems reviewed and otherwise negative.  Past Medical History  Diagnosis Date  . Coronary artery disease     s/p MI tx with Cypher DES to pRCA in 4/04; LHC 4/04 in the setting of his MI: Proximal LAD 30%, ostial diagonal 40-50%, ostial OM1 30%, proximal RCA 95% which was the infarct vessel   ;    Echocardiogram 7/09: Normal LV function.  Nuclear study  March 2013 negative for ischemia  . Dyslipidemia   . Seizure disorder   . Hypothyroidism   . DDD (degenerative disc disease)   . Hx MRSA infection     left buttocks abscess  . Rotator cuff injury     chronic rotator cuff injury status post repair  . Hypertension   . Syncope   . Dizziness   . Meniere's disease     Status post shunt  . BPH (benign prostatic hyperplasia)   . Stenosis of left internal carotid artery     40-59% on carotid doppler 2014  . Diabetes mellitus without complication   . Hyperlipidemia    Past Surgical History  Procedure Laterality Date  . Coronary angioplasty with stent placement  07/14/2002    Stent to the right coronary artery  . Rotator cuff repair      for chronic rotator cuff injury  . Hernia repair  04/10/2008    scrotal hernia repair  . Endolymphatic shunt decompression  06/11/2009      Right endolymphatic sac decompression and shunt  placement   Social History:  reports that he quit smoking about 51 years ago. He does not have any smokeless tobacco history on file. He reports that he does not drink alcohol or use illicit drugs.   No Known Allergies  Family History  Problem Relation Age of Onset  . Heart attack Father 3  . Aneurysm Mother     brain aneurysm  . Coronary artery disease Brother     with CABG    Prior to Admission medications   Medication Sig Start Date End Date Taking? Authorizing Provider  atorvastatin (LIPITOR) 40 MG tablet Take 2 tablets (80 mg total) by mouth daily. 12/11/12  Yes Donita Brooks, MD  carbamazepine (CARBATROL) 300 MG 12 hr capsule Take 300 mg by mouth 2 (two) times daily.   Yes Historical Provider, MD  cloNIDine (CATAPRES) 0.2 MG tablet Take 0.2 mg by mouth daily.   Yes Historical Provider, MD  clopidogrel (PLAVIX) 75 MG tablet Take 1 tablet (75 mg total) by mouth daily. 12/11/12  Yes Donita Brooks, MD  diazepam (VALIUM) 2 MG tablet Take 2 mg by mouth 2 (two) times daily.   Yes Historical Provider, MD  fluticasone (FLONASE) 50 MCG/ACT nasal  spray Place 1 spray into the nose daily as needed for allergies.  05/12/11  Yes Historical Provider, MD  hydrochlorothiazide (HYDRODIURIL) 25 MG tablet Take 1 tablet (25 mg total) by mouth daily. 07/28/12  Yes Donita Brooks, MD  levothyroxine (SYNTHROID, LEVOTHROID) 25 MCG tablet Take 50 mcg by mouth daily before breakfast.   Yes Historical Provider, MD  losartan (COZAAR) 50 MG tablet Take 1 tablet (50 mg total) by mouth 2 (two) times daily. 04/17/12  Yes Rollene Rotunda, MD  metFORMIN (GLUCOPHAGE) 500 MG tablet Take 500 mg by mouth 2 (two) times daily with a meal.   Yes Historical Provider, MD  metoprolol (LOPRESSOR) 50 MG tablet Take 50 mg by mouth 2 (two) times daily.   Yes Historical Provider, MD  Multiple Vitamins-Minerals (MULTIVITAMINS THER. W/MINERALS) TABS Take 1 tablet by mouth at  bedtime.     Yes Historical Provider, MD  nitroGLYCERIN (NITROSTAT) 0.4 MG SL tablet Place 1 tablet (0.4 mg total) under the tongue every 5 (five) minutes as needed for chest pain. 12/21/12  Yes Donita Brooks, MD  silodosin (RAPAFLO) 8 MG CAPS capsule Take 8 mg by mouth daily with breakfast.    Yes Historical Provider, MD   Physical Exam: Filed Vitals:   01/29/13 2345  BP: 143/73  Pulse: 72  Temp:   Resp: 18    General:  NAD, resting comfortably in bed Eyes: PEERLA EOMI ENT: mucous membranes moist Neck: supple w/o JVD Cardiovascular: RRR w/o MRG Respiratory: CTA B Abdomen: soft, nt, nd, bs+ Skin: no rash nor lesion Musculoskeletal: MAE, full ROM all 4 extremities Psychiatric: normal tone and affect Neurologic: AAOx3, grossly non-focal  Labs on Admission:  Basic Metabolic Panel:  Recent Labs Lab 01/29/13 2132  NA 136  K 3.7  CL 96  CO2 28  GLUCOSE 100*  BUN 18  CREATININE 0.96  CALCIUM 10.3   Liver Function Tests: No results found for this basename: AST, ALT, ALKPHOS, BILITOT, PROT, ALBUMIN,  in the last 168 hours No results found for this basename: LIPASE, AMYLASE,  in the last 168 hours No results found for this basename: AMMONIA,  in the last 168 hours CBC:  Recent Labs Lab 01/29/13 2132  WBC 13.8*  HGB 14.7  HCT 42.4  MCV 90.4  PLT 287   Cardiac Enzymes: No results found for this basename: CKTOTAL, CKMB, CKMBINDEX, TROPONINI,  in the last 168 hours  BNP (last 3 results) No results found for this basename: PROBNP,  in the last 8760 hours CBG: No results found for this basename: GLUCAP,  in the last 168 hours  Radiological Exams on Admission: Dg Chest 2 View  01/29/2013   CLINICAL DATA:  Chest pain.  EXAM: CHEST  2 VIEW  COMPARISON:  December 03, 2012.  FINDINGS: The heart size and mediastinal contours are within normal limits. Both lungs are clear. No pleural effusion or pneumothorax is noted. Mild anterior osteophyte formation is seen involving  mid thoracic spine.  IMPRESSION: No active cardiopulmonary disease.   Electronically Signed   By: Roque Lias M.D.   On: 01/29/2013 21:43    EKG: Independently reviewed.  Assessment/Plan Principal Problem:   Chest pain Active Problems:   Coronary artery disease   1. Chest pain - patient with history concerning for angina type chest pain, has known history of CAD, HTN, DM2, HEART score = 6.  Admitting patient to tele, NPO, likely needs cards consult and risk-stratification in the morning.  No chest pain at this  time, first troponin negative, cycling enzymes.    Code Status: Full (must indicate code status--if unknown or must be presumed, indicate so) Family Communication: No family in room (indicate person spoken with, if applicable, with phone number if by telephone) Disposition Plan: Admit to inpatient (indicate anticipated LOS)  Time spent: 70 min  GARDNER, JARED M. [facility] Pager [phone]  If 7PM-7AM, please contact night-coverage [email or web address] Password [email or web address] 01/29/2013, 11:52 PM

## 2013-01-29 NOTE — ED Notes (Signed)
Alert, NAD, calm, interactive, resps e/u, speaking in clear complete sentences, no dyspnea noted, skin W&D.  Denies pain, admits to some chest tightness (mild), also denies sob, nv, dizziness. H/o cardiac stents, "feels similar", placed on monitor, IV established, blood drawn, placed on O2, family x2 at [facility], NSR on monitor.

## 2013-01-29 NOTE — ED Provider Notes (Signed)
CSN: [id number]     Arrival date & time 01/29/13  2038 History   First MD Initiated Contact with Patient 01/29/13 2111     Chief Complaint  Patient presents with  . Chest Pain   (Consider location/radiation/quality/duration/timing/severity/associated sxs/prior Treatment) The history is provided by the patient. No language interpreter was used.   Patient is a 79 year old Caucasian male with past medical history of coronary artery disease comes emergency department today with chest pain. The chest pain began today shortly after he had been working outside.  He describes chest pain as a pressure sensation radiates to his left shoulder. He was not worse with exertion and is not worse with breathing.  Cardiac catheterization back in 2004 for similar symptoms. At that time he received a stent. He feels this pain is similar to that pain. He rates the pain at a 4/10 currently. He has not taken anything for it.  He has not taken any aspirin today. He has no cough, shortness of breath, no fevers, no chills.  Past Medical History  Diagnosis Date  . Coronary artery disease     s/p MI tx with Cypher DES to pRCA in 4/04; LHC 4/04 in the setting of his MI: Proximal LAD 30%, ostial diagonal 40-50%, ostial OM1 30%, proximal RCA 95% which was the infarct vessel   ;    Echocardiogram 7/09: Normal LV function.  Nuclear study  March 2013 negative for ischemia  . Dyslipidemia   . Seizure disorder   . Hypothyroidism   . DDD (degenerative disc disease)   . Hx MRSA infection     left buttocks abscess  . Rotator cuff injury     chronic rotator cuff injury status post repair  . Hypertension   . Syncope   . Dizziness   . Meniere's disease     Status post shunt  . BPH (benign prostatic hyperplasia)   . Stenosis of left internal carotid artery     40-59% on carotid doppler 2014  . Diabetes mellitus without complication   . Hyperlipidemia    Past Surgical History  Procedure Laterality Date  . Coronary  angioplasty with stent placement  07/14/2002    Stent to the right coronary artery  . Rotator cuff repair      for chronic rotator cuff injury  . Hernia repair  04/10/2008    scrotal hernia repair  . Endolymphatic shunt decompression  06/11/2009     Right endolymphatic sac decompression and shunt  placement   Family History  Problem Relation Age of Onset  . Heart attack Father 73  . Aneurysm Mother     brain aneurysm  . Coronary artery disease Brother     with CABG   History  Substance Use Topics  . Smoking status: Former Smoker    Quit date: 08/19/1961  . Smokeless tobacco: Not on file  . Alcohol Use: No    Review of Systems  Constitutional: Negative for fever and chills.  Respiratory: Negative for cough and shortness of breath.   Cardiovascular: Positive for chest pain.  Gastrointestinal: Negative for nausea, vomiting, diarrhea and constipation.  Genitourinary: Negative for dysuria, urgency and frequency.  All other systems reviewed and are negative.    Allergies  Review of patient's allergies indicates no known allergies.  Home Medications   Current Outpatient Rx  Name  Route  Sig  Dispense  Refill  . atorvastatin (LIPITOR) 40 MG tablet   Oral   Take 2 tablets (80 mg total) by  mouth daily.   60 tablet   5   . carbamazepine (CARBATROL) 300 MG 12 hr capsule   Oral   Take 300 mg by mouth 2 (two) times daily.         . cloNIDine (CATAPRES) 0.2 MG tablet   Oral   Take 0.2 mg by mouth daily.         . clopidogrel (PLAVIX) 75 MG tablet   Oral   Take 1 tablet (75 mg total) by mouth daily.   30 tablet   5   . diazepam (VALIUM) 2 MG tablet   Oral   Take 2 mg by mouth 2 (two) times daily.         . fluticasone (FLONASE) 50 MCG/ACT nasal spray   Nasal   Place 1 spray into the nose daily as needed for allergies.          . hydrochlorothiazide (HYDRODIURIL) 25 MG tablet   Oral   Take 1 tablet (25 mg total) by mouth daily.   30 tablet   5      Note dosage change - Please make pt aware - he wil ...   . levothyroxine (SYNTHROID, LEVOTHROID) 25 MCG tablet   Oral   Take 50 mcg by mouth daily before breakfast.         . losartan (COZAAR) 50 MG tablet   Oral   Take 1 tablet (50 mg total) by mouth 2 (two) times daily.   60 tablet   11   . metFORMIN (GLUCOPHAGE) 500 MG tablet   Oral   Take 500 mg by mouth 2 (two) times daily with a meal.         . metoprolol (LOPRESSOR) 50 MG tablet   Oral   Take 50 mg by mouth 2 (two) times daily.         . Multiple Vitamins-Minerals (MULTIVITAMINS THER. W/MINERALS) TABS   Oral   Take 1 tablet by mouth at bedtime.           . nitroGLYCERIN (NITROSTAT) 0.4 MG SL tablet   Sublingual   Place 1 tablet (0.4 mg total) under the tongue every 5 (five) minutes as needed for chest pain.   25 tablet   3   . silodosin (RAPAFLO) 8 MG CAPS capsule   Oral   Take 8 mg by mouth daily with breakfast.           BP 174/80  Pulse 69  Temp(Src) 98.2 F (36.8 C) (Oral)  Resp 15  SpO2 100% Physical Exam  Nursing note and vitals reviewed. Constitutional: He is oriented to person, place, and time. He appears well-developed and well-nourished. No distress.  HENT:  Head: Normocephalic and atraumatic.  Eyes: Pupils are equal, round, and reactive to light.  Neck: Normal range of motion.  Cardiovascular: Normal rate, regular rhythm and normal heart sounds.   Pulmonary/Chest: Effort normal and breath sounds normal. No respiratory distress. He has no decreased breath sounds. He has no wheezes. He has no rhonchi. He has no rales.  Abdominal: Soft. He exhibits no distension. There is no tenderness. There is no rebound and no guarding.  Musculoskeletal: He exhibits no edema and no tenderness.  Neurological: He is alert and oriented to person, place, and time.  Skin: Skin is warm and dry.    ED Course  Procedures (including critical care time) Labs Review Labs Reviewed  CBC - Abnormal; Notable  for the following:    WBC 13.8 (*)  All other components within normal limits  BASIC METABOLIC PANEL - Abnormal; Notable for the following:    Glucose, Bld 100 (*)    GFR calc non Af Amer 78 (*)    All other components within normal limits  TROPONIN I  TROPONIN I  TROPONIN I  POCT I-STAT TROPONIN I   Imaging Review Dg Chest 2 View  01/29/2013   CLINICAL DATA:  Chest pain.  EXAM: CHEST  2 VIEW  COMPARISON:  December 03, 2012.  FINDINGS: The heart size and mediastinal contours are within normal limits. Both lungs are clear. No pleural effusion or pneumothorax is noted. Mild anterior osteophyte formation is seen involving mid thoracic spine.  IMPRESSION: No active cardiopulmonary disease.   Electronically Signed   By: Roque Lias M.D.   On: 01/29/2013 21:43    EKG Interpretation     Ventricular Rate:  72 PR Interval:  182 QRS Duration: 110 QT Interval:  376 QTC Calculation: 411 R Axis:   -25 Text Interpretation:  Normal sinus rhythm Incomplete right bundle branch block Left ventricular hypertrophy with repolarization abnormality Cannot rule out Septal infarct , age undetermined Abnormal ECG similar to previous            MDM  Patient is a 79 year-old Caucasian male past medical history coronary disease emergency department with chest pain. Physical exam as above. Initial workup included an i-STAT troponin, CBC, CMP, chest x-ray, and EKG. EKG as above. CBC was unremarkable. BMP was unremarkable. I-STAT troponin was negative. Chest x-ray demonstrated no part of coronary disease. Doubt pneumonia or pneumothorax.  With no pleuritic component to this doubt PE.  With significant history of coronary artery disease and pain similar to previous MI patient was felt to require admission to the hospital for rule out of acute coronary syndrome. Patient was admitted to the hospitalist service in stable condition. Labs and imaging reviewed by myself and considered and medical decision-making.  Imaging was interpreted by radiology. Care was discussed with my attending Dr. Jodi Mourning.    1. Chest pain        Bethann Berkshire, MD 01/29/13 2351

## 2013-01-29 NOTE — ED Notes (Signed)
Pt. reports mid chest tightness onset this afternoon denies SOB /nausea or diaphoresis , pt. stated history of CAD / coronary stent - his cardiologist is Dr. Antoine Poche .

## 2013-01-29 NOTE — ED Notes (Signed)
Attempted Blood Lab x 1 with no success, RN aware

## 2013-01-29 NOTE — ED Notes (Addendum)
Ambulated by EMT to b/r, steady gait, no dyspnea noted, NAD, calm.

## 2013-01-29 NOTE — ED Notes (Signed)
Back from xray, no changes, family at [facility].

## 2013-01-29 NOTE — ED Notes (Signed)
Pt still in x-ray

## 2013-01-29 NOTE — ED Notes (Addendum)
Family leaving bedside, heading home.

## 2013-01-29 NOTE — ED Notes (Signed)
Denies: pain, sob, nausea or dizziness. Resting comfortably. Updated.

## 2013-01-29 NOTE — ED Notes (Signed)
Dr. Noreene Filbert EDP  at [facility]

## 2013-01-30 ENCOUNTER — Encounter (HOSPITAL_COMMUNITY)
Admission: EM | Disposition: A | Discharge status: Rehabilitation Facility | Payer: Self-pay | Source: Home / Self Care | Attending: Internal Medicine

## 2013-01-30 ENCOUNTER — Observation Stay (HOSPITAL_COMMUNITY): Payer: Medicare Other

## 2013-01-30 ENCOUNTER — Encounter (HOSPITAL_COMMUNITY): Payer: Self-pay | Admitting: *Deleted

## 2013-01-30 DIAGNOSIS — R079 Chest pain, unspecified: Secondary | ICD-10-CM

## 2013-01-30 DIAGNOSIS — R209 Unspecified disturbances of skin sensation: Secondary | ICD-10-CM

## 2013-01-30 DIAGNOSIS — I2510 Atherosclerotic heart disease of native coronary artery without angina pectoris: Secondary | ICD-10-CM | POA: Diagnosis not present

## 2013-01-30 DIAGNOSIS — I6350 Cerebral infarction due to unspecified occlusion or stenosis of unspecified cerebral artery: Secondary | ICD-10-CM | POA: Diagnosis not present

## 2013-01-30 HISTORY — PX: LEFT HEART CATHETERIZATION WITH CORONARY ANGIOGRAM: SHX5451

## 2013-01-30 LAB — CBC
HCT: 38.6 % — ABNORMAL LOW (ref 39.0–52.0)
Hemoglobin: 13.2 g/dL (ref 13.0–17.0)
MCH: 31.1 pg (ref 26.0–34.0)
MCHC: 34.2 g/dL (ref 30.0–36.0)
MCV: 90.8 fL (ref 78.0–100.0)
Platelets: 264 10*3/uL (ref 150–400)
RBC: 4.25 MIL/uL (ref 4.22–5.81)
RDW: 13.9 % (ref 11.5–15.5)
WBC: 8.7 10*3/uL (ref 4.0–10.5)

## 2013-01-30 LAB — PROTIME-INR
INR: 0.98 (ref 0.00–1.49)
Prothrombin Time: 12.8 seconds (ref 11.6–15.2)

## 2013-01-30 LAB — APTT: aPTT: 74 seconds — ABNORMAL HIGH (ref 24–37)

## 2013-01-30 LAB — TROPONIN I
Troponin I: <0.3 ng/mL (ref <0.30)
Troponin I: <0.3 ng/mL (ref <0.30)
Troponin I: <0.3 ng/mL (ref <0.30)
Troponin I: <0.3 ng/mL (ref <0.30)

## 2013-01-30 LAB — GLUCOSE, CAPILLARY
Glucose-Capillary: 101 mg/dL — ABNORMAL HIGH (ref 70–99)
Glucose-Capillary: 108 mg/dL — ABNORMAL HIGH (ref 70–99)
Glucose-Capillary: 120 mg/dL — ABNORMAL HIGH (ref 70–99)
Glucose-Capillary: 129 mg/dL — ABNORMAL HIGH (ref 70–99)
Glucose-Capillary: 140 mg/dL — ABNORMAL HIGH (ref 70–99)
Glucose-Capillary: 166 mg/dL — ABNORMAL HIGH (ref 70–99)

## 2013-01-30 LAB — CREATININE, SERUM
Creatinine, Ser: 0.81 mg/dL (ref 0.50–1.35)
GFR calc Af Amer: >90 mL/min (ref >90)
GFR calc non Af Amer: 84 mL/min — ABNORMAL LOW (ref >90)

## 2013-01-30 LAB — MRSA PCR SCREENING: MRSA by PCR: NEGATIVE

## 2013-01-30 SURGERY — LEFT HEART CATHETERIZATION WITH CORONARY ANGIOGRAM
Anesthesia: LOCAL

## 2013-01-30 MED ORDER — SODIUM CHLORIDE 0.9 % IV SOLN
1.0000 mL/kg/h | INTRAVENOUS | Status: DC
  Administered 2013-01-30: 1 mL/kg/h via INTRAVENOUS

## 2013-01-30 MED ORDER — STROKE: EARLY STAGES OF RECOVERY BOOK
Freq: Once | Status: AC
  Administered 2013-01-31: 10:00:00
  Filled 2013-01-30: qty 1

## 2013-01-30 MED ORDER — VERAPAMIL HCL 2.5 MG/ML IV SOLN
INTRAVENOUS | Status: AC
  Filled 2013-01-30: qty 2

## 2013-01-30 MED ORDER — SODIUM CHLORIDE 0.9 % IV SOLN: 250.0000 mL | INTRAVENOUS | Status: DC | PRN

## 2013-01-30 MED ORDER — NITROGLYCERIN 0.2 MG/ML ON CALL CATH LAB
INTRAVENOUS | Status: AC
  Filled 2013-01-30: qty 1

## 2013-01-30 MED ORDER — INSULIN ASPART 100 UNIT/ML ~~LOC~~ SOLN
0.0000 [IU] | Freq: Three times a day (TID) | SUBCUTANEOUS | Status: DC
  Administered 2013-01-31 – 2013-02-01 (×2): 1 [IU] via SUBCUTANEOUS

## 2013-01-30 MED ORDER — FENTANYL CITRATE 0.05 MG/ML IJ SOLN
INTRAMUSCULAR | Status: AC
  Filled 2013-01-30: qty 2

## 2013-01-30 MED ORDER — HEPARIN (PORCINE) IN NACL 2-0.9 UNIT/ML-% IJ SOLN
INTRAMUSCULAR | Status: AC
  Filled 2013-01-30: qty 1000

## 2013-01-30 MED ORDER — PANTOPRAZOLE SODIUM 40 MG PO TBEC: 40.0000 mg | DELAYED_RELEASE_TABLET | Freq: Every day | ORAL | Status: DC

## 2013-01-30 MED ORDER — ONDANSETRON HCL 4 MG/2ML IJ SOLN: 4.0000 mg | Freq: Four times a day (QID) | INTRAMUSCULAR | Status: DC | PRN

## 2013-01-30 MED ORDER — ASPIRIN 81 MG PO CHEW
81.0000 mg | CHEWABLE_TABLET | ORAL | Status: AC
  Administered 2013-01-30: 81 mg via ORAL
  Filled 2013-01-30: qty 1

## 2013-01-30 MED ORDER — SODIUM CHLORIDE 0.9 % IV SOLN: INTRAVENOUS | Status: AC

## 2013-01-30 MED ORDER — SODIUM CHLORIDE 0.9 % IV BOLUS (SEPSIS)
500.0000 mL | Freq: Once | INTRAVENOUS | Status: AC
  Administered 2013-01-30: 500 mL via INTRAVENOUS

## 2013-01-30 MED ORDER — HEPARIN SODIUM (PORCINE) 5000 UNIT/ML IJ SOLN: 5000.0000 [IU] | Freq: Three times a day (TID) | INTRAMUSCULAR | Status: DC

## 2013-01-30 MED ORDER — CARBAMAZEPINE ER 200 MG PO TB12
300.0000 mg | ORAL_TABLET | Freq: Two times a day (BID) | ORAL | Status: DC
  Administered 2013-01-30 – 2013-02-01 (×6): 300 mg via ORAL
  Filled 2013-01-30 (×7): qty 1

## 2013-01-30 MED ORDER — SODIUM CHLORIDE 0.9 % IV SOLN
INTRAVENOUS | Status: DC
  Administered 2013-01-30 – 2013-01-31 (×2): via INTRAVENOUS

## 2013-01-30 MED ORDER — LIDOCAINE HCL (PF) 1 % IJ SOLN
INTRAMUSCULAR | Status: AC
  Filled 2013-01-30: qty 30

## 2013-01-30 MED ORDER — SODIUM CHLORIDE 0.9 % IJ SOLN: 3.0000 mL | INTRAMUSCULAR | Status: DC | PRN

## 2013-01-30 MED ORDER — PANTOPRAZOLE SODIUM 40 MG PO TBEC
40.0000 mg | DELAYED_RELEASE_TABLET | Freq: Every day | ORAL | Status: DC
  Administered 2013-01-30 – 2013-02-01 (×3): 40 mg via ORAL
  Filled 2013-01-30 (×3): qty 1

## 2013-01-30 MED ORDER — DIAZEPAM 5 MG PO TABS
5.0000 mg | ORAL_TABLET | ORAL | Status: AC
  Administered 2013-01-30: 5 mg via ORAL
  Filled 2013-01-30: qty 1

## 2013-01-30 MED ORDER — ISOSORBIDE MONONITRATE ER 30 MG PO TB24: 30.0000 mg | ORAL_TABLET | Freq: Every day | ORAL | Status: DC

## 2013-01-30 MED ORDER — SODIUM CHLORIDE 0.9 % IJ SOLN
3.0000 mL | Freq: Two times a day (BID) | INTRAMUSCULAR | Status: DC
  Administered 2013-01-30: 3 mL via INTRAVENOUS

## 2013-01-30 MED ORDER — ISOSORBIDE MONONITRATE ER 30 MG PO TB24
30.0000 mg | ORAL_TABLET | Freq: Every day | ORAL | Status: DC
  Administered 2013-01-30 – 2013-02-01 (×3): 30 mg via ORAL
  Filled 2013-01-30 (×3): qty 1

## 2013-01-30 MED ORDER — ACETAMINOPHEN 325 MG PO TABS: 650.0000 mg | ORAL_TABLET | ORAL | Status: DC | PRN

## 2013-01-30 MED ORDER — HEPARIN SODIUM (PORCINE) 1000 UNIT/ML IJ SOLN
INTRAMUSCULAR | Status: AC
  Filled 2013-01-30: qty 1

## 2013-01-30 NOTE — Progress Notes (Signed)
Upon discharging patient, patient complaining of lightheadiness and dizziness while standing to dress.  Ambulating in the hallway, patient's gait unsteady.  When return back to the room, patient states his right side is numb.  Dr. Isidoro Donning paged and notified.  Placed back on Tele and IV replaced.  Rapid Response called to assist.  [name]

## 2013-01-30 NOTE — Consult Note (Signed)
NEURO HOSPITALIST CONSULT NOTE    Reason for Consult: perioral numbness, unsteadiness  HPI:                                                                                                                                          [name] is an 79 y.o. male with a past medical history significant for dyslipidemia, HTN, DM, CAD s/p MI, syncope, hypothyroidism, TGA couple of months ago, Meniere's disease, who underwent right radial cardiac cath this morning and his afternoon was supposed to be discharged when he reported having a numb sensation around his lips and right face and then was noted to be off balance and leaning to the right. Family stated that his speech was " slurred but we thought it was from sedatives". He denies associated HA, vertigo, double vision, difficulty swallowing, focal weakness, confusion, or visual disturbance. Evaluated by the rapid response nurse and NIHSS 1 reported. At this moment he said that he is doing better but the right face/lips still " little bit numb".  Past Medical History  Diagnosis Date  . Coronary artery disease     s/p MI tx with Cypher DES to pRCA in 4/04; LHC 4/04 in the setting of his MI: Proximal LAD 30%, ostial diagonal 40-50%, ostial OM1 30%, proximal RCA 95% which was the infarct vessel   ;    Echocardiogram 7/09: Normal LV function.  Nuclear study  March 2013 negative for ischemia  . Dyslipidemia   . Seizure disorder   . Hypothyroidism   . DDD (degenerative disc disease)   . Hx MRSA infection     left buttocks abscess  . Rotator cuff injury     chronic rotator cuff injury status post repair  . Hypertension   . Syncope   . Dizziness   . Meniere's disease     Status post shunt  . BPH (benign prostatic hyperplasia)   . Stenosis of left internal carotid artery     40-59% on carotid doppler 2014  . Diabetes mellitus without complication   . Hyperlipidemia     Past Surgical History  Procedure Laterality Date   . Coronary angioplasty with stent placement  07/14/2002    Stent to the right coronary artery  . Rotator cuff repair      for chronic rotator cuff injury  . Hernia repair  04/10/2008    scrotal hernia repair  . Endolymphatic shunt decompression  06/11/2009     Right endolymphatic sac decompression and shunt  placement    Family History  Problem Relation Age of Onset  . Heart attack Father 77  . Aneurysm Mother     brain aneurysm  . Coronary artery disease Brother     with CABG    Social History:  reports that he quit smoking about 51 years ago. He does not have any smokeless tobacco history on file. He reports that he does not drink alcohol or use illicit drugs.  No Known Allergies  MEDICATIONS:                                                                                                                     I have reviewed the patient's current medications.   ROS:                                                                                                                                       History obtained from the patient, family, and chart review.  General ROS: negative for - chills, fatigue, fever, night sweats, weight gain or weight loss Psychological ROS: negative for - behavioral disorder, hallucinations, memory difficulties, mood swings or suicidal ideation Ophthalmic ROS: negative for - blurry vision, double vision, eye pain or loss of vision ENT ROS: negative for - epistaxis, nasal discharge, oral lesions, sore throat, tinnitus or vertigo Allergy and Immunology ROS: negative for - hives or itchy/watery eyes Hematological and Lymphatic ROS: negative for - bleeding problems, bruising or swollen lymph nodes Endocrine ROS: negative for - galactorrhea, hair pattern changes, polydipsia/polyuria or temperature intolerance Respiratory ROS: negative for - cough, hemoptysis, shortness of breath or wheezing Cardiovascular ROS: negative for - dyspnea on exertion, edema or  irregular heartbeat Gastrointestinal ROS: negative for - abdominal pain, diarrhea, hematemesis, nausea/vomiting or stool incontinence Genito-Urinary ROS: negative for - dysuria, hematuria, incontinence or urinary frequency/urgency Musculoskeletal ROS: negative for - joint swelling or muscular weakness Neurological ROS: as noted in HPI Dermatological ROS: negative for rash and skin lesion changes   Physical exam: pleasant male in no apparent distress. Blood pressure 102/58, pulse 65, temperature 97.6 F (36.4 C), temperature source Oral, resp. rate 18, height 5\' 7"  (1.702 m), weight 100.789 kg (222 lb 3.2 oz), SpO2 96.00%. Head: normocephalic. Neck: supple, no bruits, no JVD. Cardiac: no murmurs. Lungs: clear. Abdomen: soft, no tender, no mass. Extremities: no edema.   Neurologic Examination:  Mental Status: Alert, oriented, thought content appropriate.  Speech fluent without evidence of aphasia.  Able to follow 3 step commands without difficulty. Cranial Nerves: II: Discs flat bilaterally; Visual fields grossly normal, pupils equal, round, reactive to light and accommodation III,IV, VI: ptosis not present, extra-ocular motions intact bilaterally V,VII: smile symmetric, facial light touch sensation diminished in the left face. VIII: hearing normal bilaterally IX,X: gag reflex present XI: bilateral shoulder shrug XII: midline tongue extension without atrophy or fasciculations  Motor: Right : Upper extremity   5/5    Left:     Upper extremity   5/5  Lower extremity   5/5     Lower extremity   5/5 Tone and bulk:normal tone throughout; no atrophy noted Sensory: Pinprick and light touch intact throughout, bilaterally Deep Tendon Reflexes:  Right: Upper Extremity   Left: Upper extremity   biceps (C-5 to C-6) 2/4   biceps (C-5 to C-6) 2/4 tricep (C7) 2/4    triceps (C7)  2/4 Brachioradialis (C6) 2/4  Brachioradialis (C6) 2/4  Lower Extremity Lower Extremity  quadriceps (L-2 to L-4) 2/4   quadriceps (L-2 to L-4) 2/4 Achilles (S1) 2/4   Achilles (S1) 2/4  Plantars: Right: downgoing   Left: downgoing Cerebellar: normal finger-to-nose,  normal heel-to-shin test Gait:  No tested CV: pulses palpable throughout    Lab Results  Component Value Date/Time   CHOL 195 07/19/2012  8:30 AM    Results for orders placed during the hospital encounter of 01/29/13 (from the past 48 hour(s))  POCT I-STAT TROPONIN I     Status: None   Collection Time    01/29/13  9:31 PM      Result Value Range   Troponin i, poc 0.02  0.00 - 0.08 ng/mL   Comment 3            Comment: Due to the release kinetics of cTnI,     a negative result within the first hours     of the onset of symptoms does not rule out     myocardial infarction with certainty.     If myocardial infarction is still suspected,     repeat the test at appropriate intervals.  CBC     Status: Abnormal   Collection Time    01/29/13  9:32 PM      Result Value Range   WBC 13.8 (*) 4.0 - 10.5 K/uL   RBC 4.69  4.22 - 5.81 MIL/uL   Hemoglobin 14.7  13.0 - 17.0 g/dL   HCT 40.9  81.1 - 91.4 %   MCV 90.4  78.0 - 100.0 fL   MCH 31.3  26.0 - 34.0 pg   MCHC 34.7  30.0 - 36.0 g/dL   RDW 78.2  95.6 - 21.3 %   Platelets 287  150 - 400 K/uL  BASIC METABOLIC PANEL     Status: Abnormal   Collection Time    01/29/13  9:32 PM      Result Value Range   Sodium 136  135 - 145 mEq/L   Potassium 3.7  3.5 - 5.1 mEq/L   Chloride 96  96 - 112 mEq/L   CO2 28  19 - 32 mEq/L   Glucose, Bld 100 (*) 70 - 99 mg/dL   BUN 18  6 - 23 mg/dL   Creatinine, Ser 0.86  0.50 - 1.35 mg/dL   Calcium 57.8  8.4 - 46.9 mg/dL   GFR calc non Af Amer 78 (*) >90 mL/min  GFR calc Af Amer >90  >90 mL/min   Comment: (NOTE)     The eGFR has been calculated using the CKD EPI equation.     This calculation has not been validated in all clinical  situations.     eGFR's persistently <90 mL/min signify possible Chronic Kidney     Disease.  TROPONIN I     Status: None   Collection Time    01/29/13 11:32 PM      Result Value Range   Troponin I <0.30  <0.30 ng/mL   Comment:            Due to the release kinetics of cTnI,     a negative result within the first hours     of the onset of symptoms does not rule out     myocardial infarction with certainty.     If myocardial infarction is still suspected,     repeat the test at appropriate intervals.  GLUCOSE, CAPILLARY     Status: Abnormal   Collection Time    01/30/13 12:34 AM      Result Value Range   Glucose-Capillary 101 (*) 70 - 99 mg/dL  MRSA PCR SCREENING     Status: None   Collection Time    01/30/13  1:31 AM      Result Value Range   MRSA by PCR NEGATIVE  NEGATIVE   Comment:            The GeneXpert MRSA Assay (FDA     approved for NASAL specimens     only), is one component of a     comprehensive MRSA colonization     surveillance program. It is not     intended to diagnose MRSA     infection nor to guide or     monitor treatment for     MRSA infections.  GLUCOSE, CAPILLARY     Status: Abnormal   Collection Time    01/30/13  4:23 AM      Result Value Range   Glucose-Capillary 140 (*) 70 - 99 mg/dL   Comment 1 Notify RN    TROPONIN I     Status: None   Collection Time    01/30/13  5:30 AM      Result Value Range   Troponin I <0.30  <0.30 ng/mL   Comment:            Due to the release kinetics of cTnI,     a negative result within the first hours     of the onset of symptoms does not rule out     myocardial infarction with certainty.     If myocardial infarction is still suspected,     repeat the test at appropriate intervals.  GLUCOSE, CAPILLARY     Status: Abnormal   Collection Time    01/30/13  7:35 AM      Result Value Range   Glucose-Capillary 108 (*) 70 - 99 mg/dL  GLUCOSE, CAPILLARY     Status: Abnormal   Collection Time    01/30/13 11:40 AM       Result Value Range   Glucose-Capillary 120 (*) 70 - 99 mg/dL  TROPONIN I     Status: None   Collection Time    01/30/13 11:58 AM      Result Value Range   Troponin I <0.30  <0.30 ng/mL   Comment:            Due  to the release kinetics of cTnI,     a negative result within the first hours     of the onset of symptoms does not rule out     myocardial infarction with certainty.     If myocardial infarction is still suspected,     repeat the test at appropriate intervals.  CBC     Status: Abnormal   Collection Time    01/30/13 11:58 AM      Result Value Range   WBC 8.7  4.0 - 10.5 K/uL   RBC 4.25  4.22 - 5.81 MIL/uL   Hemoglobin 13.2  13.0 - 17.0 g/dL   HCT 16.1 (*) 09.6 - 04.5 %   MCV 90.8  78.0 - 100.0 fL   MCH 31.1  26.0 - 34.0 pg   MCHC 34.2  30.0 - 36.0 g/dL   RDW 40.9  81.1 - 91.4 %   Platelets 264  150 - 400 K/uL  CREATININE, SERUM     Status: Abnormal   Collection Time    01/30/13 11:58 AM      Result Value Range   Creatinine, Ser 0.81  0.50 - 1.35 mg/dL   GFR calc non Af Amer 84 (*) >90 mL/min   GFR calc Af Amer >90  >90 mL/min   Comment: (NOTE)     The eGFR has been calculated using the CKD EPI equation.     This calculation has not been validated in all clinical situations.     eGFR's persistently <90 mL/min signify possible Chronic Kidney     Disease.  PROTIME-INR     Status: None   Collection Time    01/30/13 11:59 AM      Result Value Range   Prothrombin Time 12.8  11.6 - 15.2 seconds   INR 0.98  0.00 - 1.49  APTT     Status: Abnormal   Collection Time    01/30/13 11:59 AM      Result Value Range   aPTT 74 (*) 24 - 37 seconds   Comment:            IF BASELINE aPTT IS ELEVATED,     SUGGEST PATIENT RISK ASSESSMENT     BE USED TO DETERMINE APPROPRIATE     ANTICOAGULANT THERAPY.    Dg Chest 2 View  01/29/2013   CLINICAL DATA:  Chest pain.  EXAM: CHEST  2 VIEW  COMPARISON:  December 03, 2012.  FINDINGS: The heart size and mediastinal contours are  within normal limits. Both lungs are clear. No pleural effusion or pneumothorax is noted. Mild anterior osteophyte formation is seen involving mid thoracic spine.  IMPRESSION: No active cardiopulmonary disease.   Electronically Signed   By: Roque Lias M.D.   On: 01/29/2013 21:43    Assessment/Plan: 79 y/o with acute onset perioral/right face numbness and unsteadiness after cardiac cath this morning. NIHSS 1 and thus no a candidate for thrombolytics. Will entertain the possibility of an acute cerebrovascular insult involving pons. MRI-DWI already requested. Will follow up.   Wyatt Portela, MD [facility] [phone]  01/30/2013, 4:37 PM

## 2013-01-30 NOTE — ED Provider Notes (Signed)
Medical screening examination/treatment/procedure(s) were conducted as a shared visit with non-physician practitioner(s) or resident and myself. I personally evaluated the patient during the encounter and agree with the findings and plan unless otherwise indicated.  I have personally reviewed any xrays and/ or EKG's with the provider and I agree with interpretation.  CAD and stent hx. Chest tightness with exertion today, similar to previous CAD/ stent. No cp in ED.  No sxs in ED. No recent surgery or leg swelling. RRR, lungs clear, no leg swelling/ pain, abd soft/ NT. EKG no acute ischemia. Plan for cardiac eval and tele/ obs.  Chest pain acute, CAD   Enid Skeens, MD 01/30/13 0110

## 2013-01-30 NOTE — Significant Event (Signed)
Rapid Response Event Note  Overview:  Called for second set of eyes to assess patient with numbness left side post cardiac cath. Time Called: 1546 Arrival Time: 1558 Event Type: Neurologic  Initial Focused Assessment:  Alert supine in bed w/d oriented NAD.  Denies CP or SOB.  States he woke up from his cath today and noticed that his lips felt numb.  He also reports intermittent numbness in hands and feet today post procedure.  Right radial cath site DDI.  Has large lipoma right midforearm - family states he has had this for years.  RN Otelia Santee also reports that patient started on Imdur today.  BP left arm manual 102/48 HR 65 irregular RR 16 O2 sats 98%.  CBG 129.   Interventions:  NIHSS performed - score 1 with right side numbness.  No dysarthria although patient and family state he was slurred post-op but it was thought that he was dry from being NPO.  RN Otelia Santee states that patient had unsteady gait with OOB when walking.  Patient gait not tested by this RN - will await MD.  Denies headache.  500 CC NS bolus IV started per order Dr. Cyril Mourning.  BP 126/69 HR 67 RR 18 O2 sats 97% with 250 bolus in.  Dr. Cyril Mourning in.  Will do MRI.  Not in window for acute treatment.  Swallow screen done - passed.  Will follow as needed.  Family support given.   Event Summary: Name of Physician Notified: Dr. Isidoro Donning at [phone]  Name of Consulting Physician Notified: Dr. Cyril Mourning at 1610  Outcome: Stayed in room and stabalized  Event End Time: 1645  [name]

## 2013-01-30 NOTE — H&P (View-Only) (Signed)
CARDIOLOGY CONSULT NOTE   Patient ID: [name] MRN: [id number] DOB/AGE: 1933-07-25 79 y.o.  Admit date: 01/29/2013  Primary Physician   [name], MD Primary Cardiologist   [facility] Reason for Consultation   Chest pain  WUJ:[id number] [name] is a 79 y.o. male with a history of CAD.  He has had exertional chest pain, assessed with Lexiscan in 2013 and GXT in Feb. 2014, results below.   He had onset of chest pain, pressure, yesterday. It was not positional, no change with deep inspiration. The pain began about 3 pm, 2 hours after exertion. He had indigestion after he ate, but that was before the chest pain began. It was a pressure, at a 3 or 4/10. The symptoms waxed and waned, but did not resolve. He did not take any medications for the pain. He had no SOB, N&V or diaphoresis. He came to the ER last pm because of ongoing pain. He was given SL NTG x 1, the pain decreased immediately and resolved in about 30 more minutes. There were not associated symptoms, but the pain reminded him of his MI/pre-PCI symptoms, more so than previous episodes of pain. It scared him.   Past Medical History  Diagnosis Date  . Coronary artery disease     s/p MI tx with Cypher DES to pRCA in 4/04; LHC 4/04 in the setting of his MI: Proximal LAD 30%, ostial diagonal 40-50%, ostial OM1 30%, proximal RCA 95% which was the infarct vessel   ;    Echocardiogram 7/09: Normal LV function.  Nuclear study  March 2013 negative for ischemia  . Dyslipidemia   . Seizure disorder   . Hypothyroidism   . DDD (degenerative disc disease)   . Hx MRSA infection     left buttocks abscess  . Rotator cuff injury     chronic rotator cuff injury status post repair  . Hypertension   . Syncope   . Dizziness   . Meniere's disease     Status post shunt  . BPH (benign prostatic hyperplasia)   . Stenosis of left internal carotid artery     40-59% on carotid doppler 2014  . Diabetes mellitus without complication   .  Hyperlipidemia      Past Surgical History  Procedure Laterality Date  . Coronary angioplasty with stent placement  07/14/2002    Stent to the right coronary artery  . Rotator cuff repair      for chronic rotator cuff injury  . Hernia repair  04/10/2008    scrotal hernia repair  . Endolymphatic shunt decompression  06/11/2009     Right endolymphatic sac decompression and shunt  placement    No Known Allergies  I have reviewed the patient's current medications . atorvastatin  80 mg Oral Daily  . carbamazepine  300 mg Oral BID  . cloNIDine  0.2 mg Oral Daily  . clopidogrel  75 mg Oral Daily  . diazepam  2 mg Oral BID  . heparin  5,000 Units Subcutaneous Q8H  . hydrochlorothiazide  25 mg Oral Daily  . insulin aspart  0-9 Units Subcutaneous Q4H  . levothyroxine  50 mcg Oral QAC breakfast  . losartan  50 mg Oral BID  . metoprolol  50 mg Oral BID  . tamsulosin  0.4 mg Oral QPC breakfast     acetaminophen, fluticasone, nitroGLYCERIN, nitroGLYCERIN, ondansetron (ZOFRAN) IV  Prior to Admission medications   Medication Sig Start Date End Date Taking? Authorizing Provider  atorvastatin (LIPITOR) 40 MG tablet Take 2 tablets (80 mg total) by mouth daily. 12/11/12  Yes Donita Brooks, MD  carbamazepine (CARBATROL) 300 MG 12 hr capsule Take 300 mg by mouth 2 (two) times daily.   Yes Historical Provider, MD  cloNIDine (CATAPRES) 0.2 MG tablet Take 0.2 mg by mouth daily.   Yes Historical Provider, MD  clopidogrel (PLAVIX) 75 MG tablet Take 1 tablet (75 mg total) by mouth daily. 12/11/12  Yes Donita Brooks, MD  diazepam (VALIUM) 2 MG tablet Take 2 mg by mouth 2 (two) times daily.   Yes Historical Provider, MD  fluticasone (FLONASE) 50 MCG/ACT nasal spray Place 1 spray into the nose daily as needed for allergies.  05/12/11  Yes Historical Provider, MD  hydrochlorothiazide (HYDRODIURIL) 25 MG tablet Take 1 tablet (25 mg total) by mouth daily. 07/28/12  Yes Donita Brooks, MD  levothyroxine  (SYNTHROID, LEVOTHROID) 25 MCG tablet Take 50 mcg by mouth daily before breakfast.   Yes Historical Provider, MD  losartan (COZAAR) 50 MG tablet Take 1 tablet (50 mg total) by mouth 2 (two) times daily. 04/17/12  Yes Rollene Rotunda, MD  metFORMIN (GLUCOPHAGE) 500 MG tablet Take 500 mg by mouth 2 (two) times daily with a meal.   Yes Historical Provider, MD  metoprolol (LOPRESSOR) 50 MG tablet Take 50 mg by mouth 2 (two) times daily.   Yes Historical Provider, MD  Multiple Vitamins-Minerals (MULTIVITAMINS THER. W/MINERALS) TABS Take 1 tablet by mouth at bedtime.     Yes Historical Provider, MD  nitroGLYCERIN (NITROSTAT) 0.4 MG SL tablet Place 1 tablet (0.4 mg total) under the tongue every 5 (five) minutes as needed for chest pain. 12/21/12  Yes Donita Brooks, MD  silodosin (RAPAFLO) 8 MG CAPS capsule Take 8 mg by mouth daily with breakfast.    Yes Historical Provider, MD     History   Social History  . Marital Status: Married    Spouse Name: N/A    Number of Children: N/A  . Years of Education: N/A   Occupational History  . Retired    Social History Main Topics  . Smoking status: Former Smoker    Quit date: 08/19/1961  . Smokeless tobacco: Not on file  . Alcohol Use: No  . Drug Use: No  . Sexual Activity: Not on file   Other Topics Concern  . Not on file   Social History Narrative   Lives with wife.    Family Status  Relation Status Death Age  . Father Deceased 38    MI  . Mother Deceased 51's    of brain aneurysm  . Brother    . Brother     Family History  Problem Relation Age of Onset  . Heart attack Father 77  . Aneurysm Mother     brain aneurysm  . Coronary artery disease Brother     with CABG     ROS:  Full 14 point review of systems complete and found to be negative unless listed above.  Physical Exam: Blood pressure 151/80, pulse 66, temperature 97.4 F (36.3 C), temperature source Oral, resp. rate 18, height 5\' 7"  (1.702 m), weight 222 lb 3.2 oz (100.789  kg), SpO2 96.00%.  General: Well developed, well nourished, male in no acute distress Head: Eyes PERRLA, No xanthomas.   Normocephalic and atraumatic, oropharynx without edema or exudate. Dentition: good  Lungs: CTA bilaterally  Heart: HRRR S1 S2, no rub/gallop, Heart irregular rate and rhythm with  S1, S2  murmur. pulses are 2+ extrem.   Neck: No carotid bruits. No lymphadenopathy.  JVD. Abdomen: Bowel sounds present, abdomen soft and non-tender without masses or hernias noted. Msk:  No spine or cva tenderness. No weakness, no joint deformities or effusions. Extremities: No clubbing or cyanosis. No  edema.  Neuro: Alert and oriented X 3. No focal deficits noted. Psych:  Good affect, responds appropriately Skin: No rashes or lesions noted.  Labs:   Lab Results  Component Value Date   WBC 13.8* 01/29/2013   HGB 14.7 01/29/2013   HCT 42.4 01/29/2013   MCV 90.4 01/29/2013   PLT 287 01/29/2013     Recent Labs Lab 01/29/13 2132  NA 136  K 3.7  CL 96  CO2 28  BUN 18  CREATININE 0.96  CALCIUM 10.3  GLUCOSE 100*    Recent Labs  01/29/13 2332 01/30/13 0530  TROPONINI <0.30 <0.30    Recent Labs  01/29/13 2131  TROPIPOC 0.02   Echo: 10/10/2007 SUMMARY - The aortic root was at the upper limits of normal in size. There was mild fibrocalcific change of the aortic root. LV was grossly normal  ETT: 05/15/2012 ETT Interpretation: normal - no evidence of ischemia by ST  analysis Comments: The patient had an excellent exercise tolerance. There was no  chest pain. There was an appropriate level of dyspnea. There  were a few ventricular couplets with in recovery,a normal heart  rate response and normal BP response. There were no ischemic ST  Daniel wave changes.  Recommendations: Negative adequate ETT. No further testing is indicated. Based  on the above I gave the patient a prescription for exercise.  Lexiscan Stress:  06/02/2012 QGS EF: 57%  Impression  Exercise  Capacity: Lexiscan with no exercise.  BP Response: Normal blood pressure response.  Clinical Symptoms: Short of breath.  ECG Impression: No significant ST segment change suggestive of ischemia.  Comparison with Prior Nuclear Study: No significant change from previous study  Overall Impression: Normal stress nuclear study.   Cardiac Cath: 07/14/2002 Left Main: Short vessel, which is angiographically normal.   LAD: The LAD is a moderate size vessel giving rise to a single diagonal  branch. There is a 30% stenosis of the proximal vessel at the site of  the takeoff of the diagonal. This diagonal has approximately 40-50%  ostial stenosis.   Ramus Intermedius: Small vessel, which is angiographically normal.   Circumflex: The circumflex is a large vessel giving rise to two large  obtuse marginal branches. The first marginal has a 30% ostial stenosis.   RCA: The RCA is a very large vessel giving rise to a large PDA and large  posterior left ventricular branch. There is a thrombotic 95% stenosis of  the proximal vessel with associated TIMI II flow. This was successfully  Stented with a 3.5 x 18 mm CYPHER stent resulting in no residual stenosis and TIMI III flow.   No aortic stenosis or mitral regurgitation.  ECG: 01/29/2013 SR, R wave much large in V2 than V3, ?abnl lead placement Vent. rate 72 BPM PR interval 182 ms QRS duration 110 ms QT/QTc 376/411 ms P-R-Daniel axes 44 -25 118  Radiology:  Dg Chest 2 View 01/29/2013   CLINICAL DATA:  Chest pain.  EXAM: CHEST  2 VIEW  COMPARISON:  December 03, 2012.  FINDINGS: The heart size and mediastinal contours are within normal limits. Both lungs are clear. No pleural effusion or pneumothorax is noted. Mild anterior osteophyte formation  is seen involving mid thoracic spine.  IMPRESSION: No active cardiopulmonary disease.   Electronically Signed   By: Roque Lias M.D.   On: 01/29/2013 21:43    ASSESSMENT AND PLAN:   The patient was seen today by Dr.  Myrtis Ser, the patient evaluated and the data reviewed.   Principal Problem:   Chest pain - no clear exertional symptoms, but responsive to NTG and reminded him of his MI pain. Stress test w/ last year was OK. Consider cardiac cath to further define anatomy. The risks and benefits of a cardiac catheterization including, but not limited to, death, stroke, MI, kidney damage and bleeding were discussed with the patient who indicates understanding and agrees to proceed.    Active Problems:   Coronary artery disease - see cath report above.   Signed: Theodore Demark, PA-C 01/30/2013 9:00 AM Beeper [phone]  Co-Sign MD  Patient seen with PA, agree with the above note.  He has history of NSTEMI with Cypher DES to RCA in 2004.  Yesterday, he developed severe substernal chest pressure shortly after raking leaves.  He says that the pain was highly reminiscent of his NSTEMI in 2004.  It lasted about 2 hours total and resolved in the ER with nitroglycerine.  ECG showed nonspecific changes and cardiac enzymes were negative.  He had a negative ETT in 2/14 and a negative Lexiscan Cardiolite in 2013.  His symptoms are quite concerning for unstable angina.  We discussed risks/benefits of proceeding with catheterization versus stress testing and decided on catheterization this morning.    [name] 01/30/2013 9:07 AM

## 2013-01-30 NOTE — Plan of Care (Signed)
Called by RN, patient c/o right sided numbness. Patient seen and examined, originally I had seen at noon, patient had just returned from cardiac catheterization, was doing well except had some numb sensation around his lips. He had received Valium for the cardiac cath, patient fell that he was improving and at his lunch without any difficulty.  Patient was getting ready for discharge around 4 PM, then are noted the patient had unsteady gait and he complained about right-sided numbness on the face, arm and leg.  I examined the patient along with stroke nurse, the exam was essentially unchanged from my earlier exam known as except that patient had subtle decrease in the sensation on the right side and unsteady gait  BP 129/62  Pulse 65  Temp(Src) 98.1 F (36.7 C) (Oral)  Resp 18  Ht 5\' 7"  (1.702 m)  Wt 100.789 kg (222 lb 3.2 oz)  BMI 34.79 kg/m2  SpO2 96%  Physical exam General: Alert and awake oriented x3 not in any acute distress.  CVS: S1-S2 clear no murmur rubs or gallops  Chest: clear to auscultation bilaterally, no wheezing rales or rhonchi  Abdomen: soft nontender, nondistended, normal bowel sounds  Extremities: no cyanosis, clubbing or edema noted bilaterally  Neuro: Cranial nerves II-XII intact, no focal neurological deficits, unsteady gait, no dysarthria Fascia light touch diminished  1) right-sided numbness - Ordered stat MRI of the brain without contrast, discussed with neurology for consult   - Patient is already on Plavix, statin - If MRI of the brain positive for stroke, will need full stroke workup  2) chest pain with history of coronary disease - Please see hospital course and my discharge summary today - I have also made him an appointment with [facility] on 02/01/2013  Discharge canceled  RAI,RIPUDEEP M.D. [facility] 01/30/2013, 5:43 PM  Pager: [phone]

## 2013-01-30 NOTE — Progress Notes (Signed)
TR Band removed without complications, right radial level 0 without bleeding or hematoma.  Reverse Allen's Test + for good blood flow.  Will continue to monitor.  Colman Cater

## 2013-01-30 NOTE — Discharge Summary (Signed)
Physician Discharge Summary  Patient ID: [name] MRN: [id number] DOB/AGE: [birth year] 79 y.o.  Admit date: 01/29/2013 Discharge date: 01/30/2013  Primary Care Physician:  [name], MD  Primary Discharge Diagnoses:   Atypical chest pain   Secondary discharge diagnosis  . Coronary artery disease .  GERD Dyslipidemia Hypothyroidism  diabetes mellitus   Consults:  Cardiology   Recommendations for Outpatient Follow-up:  1. patient was any further episodes of chest pain, may need balloon angioplasty to LAD at D1 take off 2. patient also complained of indigestion after eating, GI appointment also made on 02/01/2013  Allergies:  No Known Allergies   Discharge Medications:   Medication List         atorvastatin 40 MG tablet  Commonly known as:  LIPITOR  Take 2 tablets (80 mg total) by mouth daily.     carbamazepine 300 MG 12 hr capsule  Commonly known as:  CARBATROL  Take 300 mg by mouth 2 (two) times daily.     cloNIDine 0.2 MG tablet  Commonly known as:  CATAPRES  Take 0.2 mg by mouth daily.     clopidogrel 75 MG tablet  Commonly known as:  PLAVIX  Take 1 tablet (75 mg total) by mouth daily.     diazepam 2 MG tablet  Commonly known as:  VALIUM  Take 2 mg by mouth 2 (two) times daily.     fluticasone 50 MCG/ACT nasal spray  Commonly known as:  FLONASE  Place 1 spray into the nose daily as needed for allergies.     hydrochlorothiazide 25 MG tablet  Commonly known as:  HYDRODIURIL  Take 1 tablet (25 mg total) by mouth daily.     isosorbide mononitrate 30 MG 24 hr tablet  Commonly known as:  IMDUR  Take 1 tablet (30 mg total) by mouth daily.     levothyroxine 25 MCG tablet  Commonly known as:  SYNTHROID, LEVOTHROID  Take 50 mcg by mouth daily before breakfast.     losartan 50 MG tablet  Commonly known as:  COZAAR  Take 1 tablet (50 mg total) by mouth 2 (two) times daily.     metFORMIN 500 MG tablet  Commonly known as:  GLUCOPHAGE   Take 500 mg by mouth 2 (two) times daily with a meal.     metoprolol 50 MG tablet  Commonly known as:  LOPRESSOR  Take 50 mg by mouth 2 (two) times daily.     multivitamins ther. w/minerals Tabs tablet  Take 1 tablet by mouth at bedtime.     nitroGLYCERIN 0.4 MG SL tablet  Commonly known as:  NITROSTAT  Place 1 tablet (0.4 mg total) under the tongue every 5 (five) minutes as needed for chest pain.     pantoprazole 40 MG tablet  Commonly known as:  PROTONIX  Take 1 tablet (40 mg total) by mouth daily.     silodosin 8 MG Caps capsule  Commonly known as:  RAPAFLO  Take 8 mg by mouth daily with breakfast.         Brief H and P: For complete details please refer to admission H and P, but in brief patient is a 79 year old male with history of coronary disease, dyslipidemia, hypothyroidism presented to ED with left-sided chest pain. Patient's symptoms started after raking leads are for back from his lawnmower and earlier on the day of admission, symptoms described as pressure-like sensation with radiation to the left shoulder and similar chest pain episode back in  2004 during his MI. Patient was admitted for further workup  Hospital Course:   Chest pain with history of coronary artery disease, dyslipidemia, and NSTEMI 2004 was admitted for further workup. EKG showed nonspecific changes. Cardiac enzymes remained negative today. Patient already had negative ETT in every 2014 and negative lexiscan Cardiolite in 2013. Due to concern for unstable angina, patient underwent cardiac cath today. Patient was found to have patent RCA stent, There was 40-50% proximal LAD stenosis at D1 with 70-80% ostial D1 stenosis. D1 disease looks like it could potentially cause exertional chest pain, however intervention would be difficult given the disease in the LAD at [facility] take off, could potentially perform balloon angioplasty if patient has any further chest pain.  Imdur was added to the regimen by  cardiology. Patient later complained about indigestion and heartburn after eating, he never had any endoscopy workup done before. Patient was started on PPI. Patient was prior seen by Dr. Juanda Chance hence GI appointment was made on 02/01/2013.  Day of Discharge BP 125/61  Pulse 61  Temp(Src) 97.4 F (36.3 C) (Oral)  Resp 18  Ht 5\' 7"  (1.702 m)  Wt 100.789 kg (222 lb 3.2 oz)  BMI 34.79 kg/m2  SpO2 95%  Physical Exam: General: Alert and awake oriented x3 not in any acute distress. CVS: S1-S2 clear no murmur rubs or gallops Chest: clear to auscultation bilaterally, no wheezing rales or rhonchi Abdomen: soft nontender, nondistended, normal bowel sounds Extremities: no cyanosis, clubbing or edema noted bilaterally Neuro: Cranial nerves II-XII intact, no focal neurological deficits   The results of significant diagnostics from this hospitalization (including imaging, microbiology, ancillary and laboratory) are listed below for reference.    LAB RESULTS: Basic Metabolic Panel:  Recent Labs Lab 01/29/13 2132  NA 136  K 3.7  CL 96  CO2 28  GLUCOSE 100*  BUN 18  CREATININE 0.96  CALCIUM 10.3   CBC:  Recent Labs Lab 01/29/13 2132  WBC 13.8*  HGB 14.7  HCT 42.4  MCV 90.4  PLT 287   Cardiac Enzymes:  Recent Labs Lab 01/29/13 2332 01/30/13 0530  TROPONINI <0.30 <0.30   BNP: No components found with this basename: POCBNP,  CBG:  Recent Labs Lab 01/30/13 0423 01/30/13 0735  GLUCAP 140* 108*    Significant Diagnostic Studies:  Dg Chest 2 View  01/29/2013   CLINICAL DATA:  Chest pain.  EXAM: CHEST  2 VIEW  COMPARISON:  December 03, 2012.  FINDINGS: The heart size and mediastinal contours are within normal limits. Both lungs are clear. No pleural effusion or pneumothorax is noted. Mild anterior osteophyte formation is seen involving mid thoracic spine.  IMPRESSION: No active cardiopulmonary disease.   Electronically Signed   By: Roque Lias M.D.   On: 01/29/2013  21:43    Coronary angiography:  Coronary dominance: right  Left mainstem: Short with no significant disease.  Left anterior descending (LAD): 40-50% proximal LAD stenosis at D1. D1 was moderate with 70-80% ostial stenosis.  Left circumflex (LCx): AV LCx with luminal irregularities. Small ramus without significant disease. 40% ostial OM1 stenosis.  Right coronary artery (RCA): There was a stent in the proximal RCA with minimal in-stent restenosis. 30% mid RCA stenosis.  Left ventriculography: Left ventricular systolic function is normal, LVEF is estimated at 60-65%, there is no significant mitral regurgitation, no wall motion abnormalities in the RAO projection.  Final Conclusions: Patent RCA stent. There was 40-50% proximal LAD stenosis at D1 with 70-80% ostial D1 stenosis. D1  disease looks like it could potentially cause exertional chest pain, but would be surprised if this could have caused the 2 hours of chest pain at rest he had yesterday. I reviewed with Dr. [place]: intervention would be difficult given the disease in the LAD at D1 take-off, could potentially perform balloon angioplasty. Will plan on medical management. Will add Imdur to regimen. If he has further chest pain, could re-visit intervention on D1.       Disposition and Follow-up:     Discharge Orders   Future Appointments Provider Department Dept Phone   02/01/2013 10:00 AM Amy Oswald Hillock, Cordelia Poche [facility] [phone]   03/12/2013 8:15 AM Donita Brooks, MD [facility] [phone]   Future Orders Complete By Expires   Diet - low sodium heart healthy  As directed    Increase activity slowly  As directed        DISPOSITION: Home DIET: Heart healthy diet ACTIVITY: As tolerated   DISCHARGE FOLLOW-UP Follow-up Information   Follow up with [facility] Harton TOM, MD. Schedule an appointment as soon as possible for a visit in 2 weeks.   Specialty:  Family Medicine   Contact  information:   [address] [place] [address] [phone]       Follow up with Lina Sar, MD On 02/01/2013. (at 10:00AM. Dr Regino Schultze NP, Amy Bretta Bang will see you )    Specialty:  Gastroenterology   Contact information:   [address] [place] [address] [phone]       Time spent on Discharge: 35 mins  Signed:   RAI,RIPUDEEP M.D. [facility] 01/30/2013, 12:18 PM Pager: [phone]

## 2013-01-30 NOTE — Interval H&P Note (Signed)
Cath Lab Visit (complete for each Cath Lab visit)  Clinical Evaluation Leading to the Procedure:   ACS: yes  Non-ACS:    Anginal Classification: CCS IV  Anti-ischemic medical therapy: Minimal Therapy (1 class of medications)  Non-Invasive Test Results: No non-invasive testing performed  Prior CABG: No previous CABG      History and Physical Interval Note:  01/30/2013 10:24 AM  [name]  has presented today for surgery, with the diagnosis of cp  The various methods of treatment have been discussed with the patient and family. After consideration of risks, benefits and other options for treatment, the patient has consented to  Procedure(s): LEFT HEART CATHETERIZATION WITH CORONARY ANGIOGRAM (N/A) as a surgical intervention .  The patient's history has been reviewed, patient examined, no change in status, stable for surgery.  I have reviewed the patient's chart and labs.  Questions were answered to the patient's satisfaction.     Dalton [employer]

## 2013-01-30 NOTE — Consult Note (Signed)
    CARDIOLOGY CONSULT NOTE   Patient ID: [name] MRN: [id number] DOB/AGE: 06/18/1933 79 y.o.  Admit date: 01/29/2013  Primary Physician   PICKARD,WARREN TOM, MD Primary Cardiologist   JH Reason for Consultation   Chest pain  HPI:[name] is a 79 y.o. male with a history of CAD.  He has had exertional chest pain, assessed with Lexiscan in 2013 and GXT in Feb. 2014, results below.   He had onset of chest pain, pressure, yesterday. It was not positional, no change with deep inspiration. The pain began about 3 pm, 2 hours after exertion. He had indigestion after he ate, but that was before the chest pain began. It was a pressure, at a 3 or 4/10. The symptoms waxed and waned, but did not resolve. He did not take any medications for the pain. He had no SOB, N&V or diaphoresis. He came to the ER last pm because of ongoing pain. He was given SL NTG x 1, the pain decreased immediately and resolved in about 30 more minutes. There were not associated symptoms, but the pain reminded him of his MI/pre-PCI symptoms, more so than previous episodes of pain. It scared him.   Past Medical History  Diagnosis Date  . Coronary artery disease     s/p MI tx with Cypher DES to pRCA in 4/04; LHC 4/04 in the setting of his MI: Proximal LAD 30%, ostial diagonal 40-50%, ostial OM1 30%, proximal RCA 95% which was the infarct vessel   ;    Echocardiogram 7/09: Normal LV function.  Nuclear study  March 2013 negative for ischemia  . Dyslipidemia   . Seizure disorder   . Hypothyroidism   . DDD (degenerative disc disease)   . Hx MRSA infection     left buttocks abscess  . Rotator cuff injury     chronic rotator cuff injury status post repair  . Hypertension   . Syncope   . Dizziness   . Meniere's disease     Status post shunt  . BPH (benign prostatic hyperplasia)   . Stenosis of left internal carotid artery     40-59% on carotid doppler 2014  . Diabetes mellitus without complication   .  Hyperlipidemia      Past Surgical History  Procedure Laterality Date  . Coronary angioplasty with stent placement  07/14/2002    Stent to the right coronary artery  . Rotator cuff repair      for chronic rotator cuff injury  . Hernia repair  04/10/2008    scrotal hernia repair  . Endolymphatic shunt decompression  06/11/2009     Right endolymphatic sac decompression and shunt  placement    No Known Allergies  I have reviewed the patient's current medications . atorvastatin  80 mg Oral Daily  . carbamazepine  300 mg Oral BID  . cloNIDine  0.2 mg Oral Daily  . clopidogrel  75 mg Oral Daily  . diazepam  2 mg Oral BID  . heparin  5,000 Units Subcutaneous Q8H  . hydrochlorothiazide  25 mg Oral Daily  . insulin aspart  0-9 Units Subcutaneous Q4H  . levothyroxine  50 mcg Oral QAC breakfast  . losartan  50 mg Oral BID  . metoprolol  50 mg Oral BID  . tamsulosin  0.4 mg Oral QPC breakfast     acetaminophen, fluticasone, nitroGLYCERIN, nitroGLYCERIN, ondansetron (ZOFRAN) IV  Prior to Admission medications   Medication Sig Start Date End Date Taking? Authorizing Provider    atorvastatin (LIPITOR) 40 MG tablet Take 2 tablets (80 mg total) by mouth daily. 12/11/12  Yes Warren T Pickard, MD  carbamazepine (CARBATROL) 300 MG 12 hr capsule Take 300 mg by mouth 2 (two) times daily.   Yes Historical Provider, MD  cloNIDine (CATAPRES) 0.2 MG tablet Take 0.2 mg by mouth daily.   Yes Historical Provider, MD  clopidogrel (PLAVIX) 75 MG tablet Take 1 tablet (75 mg total) by mouth daily. 12/11/12  Yes Warren T Pickard, MD  diazepam (VALIUM) 2 MG tablet Take 2 mg by mouth 2 (two) times daily.   Yes Historical Provider, MD  fluticasone (FLONASE) 50 MCG/ACT nasal spray Place 1 spray into the nose daily as needed for allergies.  05/12/11  Yes Historical Provider, MD  hydrochlorothiazide (HYDRODIURIL) 25 MG tablet Take 1 tablet (25 mg total) by mouth daily. 07/28/12  Yes Warren T Pickard, MD  levothyroxine  (SYNTHROID, LEVOTHROID) 25 MCG tablet Take 50 mcg by mouth daily before breakfast.   Yes Historical Provider, MD  losartan (COZAAR) 50 MG tablet Take 1 tablet (50 mg total) by mouth 2 (two) times daily. 04/17/12  Yes James Hochrein, MD  metFORMIN (GLUCOPHAGE) 500 MG tablet Take 500 mg by mouth 2 (two) times daily with a meal.   Yes Historical Provider, MD  metoprolol (LOPRESSOR) 50 MG tablet Take 50 mg by mouth 2 (two) times daily.   Yes Historical Provider, MD  Multiple Vitamins-Minerals (MULTIVITAMINS THER. W/MINERALS) TABS Take 1 tablet by mouth at bedtime.     Yes Historical Provider, MD  nitroGLYCERIN (NITROSTAT) 0.4 MG SL tablet Place 1 tablet (0.4 mg total) under the tongue every 5 (five) minutes as needed for chest pain. 12/21/12  Yes Warren T Pickard, MD  silodosin (RAPAFLO) 8 MG CAPS capsule Take 8 mg by mouth daily with breakfast.    Yes Historical Provider, MD     History   Social History  . Marital Status: Married    Spouse Name: N/A    Number of Children: N/A  . Years of Education: N/A   Occupational History  . Retired    Social History Main Topics  . Smoking status: Former Smoker    Quit date: 08/19/1961  . Smokeless tobacco: Not on file  . Alcohol Use: No  . Drug Use: No  . Sexual Activity: Not on file   Other Topics Concern  . Not on file   Social History Narrative   Lives with wife.    Family Status  Relation Status Death Age  . Father Deceased 71    MI  . Mother Deceased 60's    of brain aneurysm  . Brother    . Brother     Family History  Problem Relation Age of Onset  . Heart attack Father 71  . Aneurysm Mother     brain aneurysm  . Coronary artery disease Brother     with CABG     ROS:  Full 14 point review of systems complete and found to be negative unless listed above.  Physical Exam: Blood pressure 151/80, pulse 66, temperature 97.4 F (36.3 C), temperature source Oral, resp. rate 18, height 5' 7" (1.702 m), weight 222 lb 3.2 oz (100.789  kg), SpO2 96.00%.  General: Well developed, well nourished, male in no acute distress Head: Eyes PERRLA, No xanthomas.   Normocephalic and atraumatic, oropharynx without edema or exudate. Dentition: good  Lungs: CTA bilaterally  Heart: HRRR S1 S2, no rub/gallop, Heart irregular rate and rhythm with   S1, S2  murmur. pulses are 2+ extrem.   Neck: No carotid bruits. No lymphadenopathy.  JVD. Abdomen: Bowel sounds present, abdomen soft and non-tender without masses or hernias noted. Msk:  No spine or cva tenderness. No weakness, no joint deformities or effusions. Extremities: No clubbing or cyanosis. No  edema.  Neuro: Alert and oriented X 3. No focal deficits noted. Psych:  Good affect, responds appropriately Skin: No rashes or lesions noted.  Labs:   Lab Results  Component Value Date   WBC 13.8* 01/29/2013   HGB 14.7 01/29/2013   HCT 42.4 01/29/2013   MCV 90.4 01/29/2013   PLT 287 01/29/2013     Recent Labs Lab 01/29/13 2132  NA 136  K 3.7  CL 96  CO2 28  BUN 18  CREATININE 0.96  CALCIUM 10.3  GLUCOSE 100*    Recent Labs  01/29/13 2332 01/30/13 0530  TROPONINI <0.30 <0.30    Recent Labs  01/29/13 2131  TROPIPOC 0.02   Echo: 10/10/2007 SUMMARY - The aortic root was at the upper limits of normal in size. There was mild fibrocalcific change of the aortic root. LV was grossly normal  ETT: 05/15/2012 ETT Interpretation: normal - no evidence of ischemia by ST  analysis Comments: The patient had an excellent exercise tolerance. There was no  chest pain. There was an appropriate level of dyspnea. There  were a few ventricular couplets with in recovery,a normal heart  rate response and normal BP response. There were no ischemic ST  T wave changes.  Recommendations: Negative adequate ETT. No further testing is indicated. Based  on the above I gave the patient a prescription for exercise.  Lexiscan Stress:  06/02/2012 QGS EF: 57%  Impression  Exercise  Capacity: Lexiscan with no exercise.  BP Response: Normal blood pressure response.  Clinical Symptoms: Short of breath.  ECG Impression: No significant ST segment change suggestive of ischemia.  Comparison with Prior Nuclear Study: No significant change from previous study  Overall Impression: Normal stress nuclear study.   Cardiac Cath: 07/14/2002 Left Main: Short vessel, which is angiographically normal.   LAD: The LAD is a moderate size vessel giving rise to a single diagonal  branch. There is a 30% stenosis of the proximal vessel at the site of  the takeoff of the diagonal. This diagonal has approximately 40-50%  ostial stenosis.   Ramus Intermedius: Small vessel, which is angiographically normal.   Circumflex: The circumflex is a large vessel giving rise to two large  obtuse marginal branches. The first marginal has a 30% ostial stenosis.   RCA: The RCA is a very large vessel giving rise to a large PDA and large  posterior left ventricular branch. There is a thrombotic 95% stenosis of  the proximal vessel with associated TIMI II flow. This was successfully  Stented with a 3.5 x 18 mm CYPHER stent resulting in no residual stenosis and TIMI III flow.   No aortic stenosis or mitral regurgitation.  ECG: 01/29/2013 SR, R wave much large in V2 than V3, ?abnl lead placement Vent. rate 72 BPM PR interval 182 ms QRS duration 110 ms QT/QTc 376/411 ms P-R-T axes 44 -25 118  Radiology:  Dg Chest 2 View 01/29/2013   CLINICAL DATA:  Chest pain.  EXAM: CHEST  2 VIEW  COMPARISON:  December 03, 2012.  FINDINGS: The heart size and mediastinal contours are within normal limits. Both lungs are clear. No pleural effusion or pneumothorax is noted. Mild anterior osteophyte formation   is seen involving mid thoracic spine.  IMPRESSION: No active cardiopulmonary disease.   Electronically Signed   By: James  Green M.D.   On: 01/29/2013 21:43    ASSESSMENT AND PLAN:   The patient was seen today by Dr.  Katz, the patient evaluated and the data reviewed.   Principal Problem:   Chest pain - no clear exertional symptoms, but responsive to NTG and reminded him of his MI pain. Stress test w/ last year was OK. Consider cardiac cath to further define anatomy. The risks and benefits of a cardiac catheterization including, but not limited to, death, stroke, MI, kidney damage and bleeding were discussed with the patient who indicates understanding and agrees to proceed.    Active Problems:   Coronary artery disease - see cath report above.   Signed: Rhonda Barrett, PA-C 01/30/2013 9:00 AM Beeper [phone]  Co-Sign MD  Patient seen with PA, agree with the above note.  He has history of NSTEMI with Cypher DES to RCA in 2004.  Yesterday, he developed severe substernal chest pressure shortly after raking leaves.  He says that the pain was highly reminiscent of his NSTEMI in 2004.  It lasted about 2 hours total and resolved in the ER with nitroglycerine.  ECG showed nonspecific changes and cardiac enzymes were negative.  He had a negative ETT in 2/14 and a negative Lexiscan Cardiolite in 2013.  His symptoms are quite concerning for unstable angina.  We discussed risks/benefits of proceeding with catheterization versus stress testing and decided on catheterization this morning.    Dalton McLean 01/30/2013 9:07 AM  

## 2013-01-30 NOTE — CV Procedure (Signed)
    Cardiac Catheterization Procedure Note  Name: [name] MRN: [id number] DOB: 1934/02/08  Procedure: Left Heart Cath, Selective Coronary Angiography, LV angiography  Indication: Unstable angina in patient with history of NSTEMI and Cypher DES to RCA.    Procedural Details: Allen's test on the right wrist was positive.  The right wrist was prepped, draped, and anesthetized with 1% lidocaine. Using the modified Seldinger technique, a 5 French sheath was introduced into the right radial artery. 3 mg of verapamil was administered through the sheath, weight-based unfractionated heparin was administered intravenously. Standard Judkins catheters were used for selective coronary angiography and left ventriculography. Catheter exchanges were performed over an exchange length guidewire. There were no immediate procedural complications. A TR band was used for radial hemostasis at the completion of the procedure.  The patient was transferred to the post catheterization recovery area for further monitoring.  Procedural Findings: Hemodynamics: AO 108/59 LV 105/14  Coronary angiography: Coronary dominance: right  Left mainstem: Short with no significant disease.    Left anterior descending (LAD): 40-50% proximal LAD stenosis at D1.  D1 was moderate with 70-80% ostial stenosis.   Left circumflex (LCx): AV LCx with luminal irregularities.  Small ramus without significant disease.  40% ostial OM1 stenosis.   Right coronary artery (RCA): There was a stent in the proximal RCA with minimal in-stent restenosis.  30% mid RCA stenosis.   Left ventriculography: Left ventricular systolic function is normal, LVEF is estimated at 60-65%, there is no significant mitral regurgitation, no wall motion abnormalities in the RAO projection.    Final Conclusions:  Patent RCA stent.  There was 40-50% proximal LAD stenosis at D1 with 70-80% ostial D1 stenosis.  D1 disease looks like it could potentially cause  exertional chest pain, but would be surprised if this could have caused the 2 hours of chest pain at rest he had yesterday.  I reviewed with Dr. [place]: intervention would be difficult given the disease in the LAD at D1 take-off, could potentially perform balloon angioplasty.  Will plan on medical management.  Will add Imdur to regimen.  If he has further chest pain, could re-visit intervention on D1.   [name] 01/30/2013, 11:05 AM

## 2013-01-30 NOTE — Progress Notes (Signed)
UR COMPLETED  

## 2013-01-30 NOTE — Progress Notes (Signed)
Upon initially receiving patient back from the Cath Lab this afternoon, patient had slurred speech and drowsy.  States he just feels sleepy.  Speech cleared the more he spoke.  Grips equal, no facial droop, and patient oriented x4.  Dr. Isidoro Donning at bedside and aware.  Colman Cater

## 2013-01-30 NOTE — ED Notes (Signed)
Pt was ambulatory to b/r earlier, urine not measured (scant return documented in error).

## 2013-01-30 NOTE — ED Notes (Signed)
CBG 101 

## 2013-01-31 ENCOUNTER — Observation Stay (HOSPITAL_COMMUNITY): Payer: Medicare Other

## 2013-01-31 DIAGNOSIS — I517 Cardiomegaly: Secondary | ICD-10-CM

## 2013-01-31 DIAGNOSIS — I6350 Cerebral infarction due to unspecified occlusion or stenosis of unspecified cerebral artery: Secondary | ICD-10-CM | POA: Diagnosis not present

## 2013-01-31 DIAGNOSIS — I10 Essential (primary) hypertension: Secondary | ICD-10-CM

## 2013-01-31 DIAGNOSIS — E785 Hyperlipidemia, unspecified: Secondary | ICD-10-CM

## 2013-01-31 DIAGNOSIS — Z8673 Personal history of transient ischemic attack (TIA), and cerebral infarction without residual deficits: Secondary | ICD-10-CM | POA: Diagnosis not present

## 2013-01-31 LAB — GLUCOSE, CAPILLARY
Glucose-Capillary: 124 mg/dL — ABNORMAL HIGH (ref 70–99)
Glucose-Capillary: 145 mg/dL — ABNORMAL HIGH (ref 70–99)
Glucose-Capillary: 129 mg/dL — ABNORMAL HIGH (ref 70–99)
Glucose-Capillary: 141 mg/dL — ABNORMAL HIGH (ref 70–99)

## 2013-01-31 LAB — LIPID PANEL
Cholesterol: 147 mg/dL (ref 0–200)
HDL: 54 mg/dL (ref >39)
LDL Cholesterol: 76 mg/dL (ref 0–99)
Total CHOL/HDL Ratio: 2.7 RATIO
Triglycerides: 86 mg/dL (ref <150)
VLDL: 17 mg/dL (ref 0–40)

## 2013-01-31 LAB — TROPONIN I
Troponin I: <0.3 ng/mL (ref <0.30)
Troponin I: <0.3 ng/mL (ref <0.30)
Troponin I: <0.3 ng/mL (ref <0.30)
Troponin I: <0.3 ng/mL (ref <0.30)

## 2013-01-31 LAB — HEMOGLOBIN A1C
Hgb A1c MFr Bld: 6.5 % — ABNORMAL HIGH (ref <5.7)
Mean Plasma Glucose: 140 mg/dL — ABNORMAL HIGH (ref <117)

## 2013-01-31 NOTE — Progress Notes (Signed)
Stroke Team Progress Note  HISTORY [name] is an 79 y.o. male with a past medical history significant for dyslipidemia, HTN, DM, CAD s/p MI, syncope, hypothyroidism, TGA couple of months ago, Meniere's disease, who underwent right radial cardiac cath this morning 01/30/2013 and this afternoon was supposed to be discharged when he reported having a numb sensation around his lips and right face and then was noted to be off balance and leaning to the right. Family stated that his speech was " slurred but we thought it was from sedatives". He denies associated HA, vertigo, double vision, difficulty swallowing, focal weakness, confusion, or visual disturbance. Evaluated by the rapid response nurse and NIHSS 1 reported. At this moment he said that he is doing better but the right face/lips still " little bit numb". Patient was not a TPA candidate secondary to NIHSS =1 with only numbness (too mild to treat). He was admitted for further evaluation and treatment.  SUBJECTIVE His wife is at the bedside.  Overall he feels his condition is stable. He still reports some numbness.  OBJECTIVE Most recent Vital Signs: Filed Vitals:   01/31/13 0200 01/31/13 0400 01/31/13 0600 01/31/13 0715  BP: 110/51 101/60 138/57 125/62  Pulse: 67 64 63 62  Temp:  98.5 F (36.9 C)  98.3 F (36.8 C)  TempSrc:  Oral    Resp: 18 18 18 18   Height:      Weight:      SpO2: 97% 97% 96% 97%   CBG (last 3)   Recent Labs  01/30/13 1140 01/30/13 1631 01/30/13 2135  GLUCAP 120* 129* 166*    IV Fluid Intake:   . sodium chloride 75 mL/hr at 01/31/13 0520    MEDICATIONS  .  stroke: mapping our early stages of recovery book   Does not apply Once  . atorvastatin  80 mg Oral Daily  . carbamazepine  300 mg Oral BID  . cloNIDine  0.2 mg Oral Daily  . clopidogrel  75 mg Oral Daily  . diazepam  2 mg Oral BID  . heparin  5,000 Units Subcutaneous Q8H  . hydrochlorothiazide  25 mg Oral Daily  . insulin aspart  0-9  Units Subcutaneous TID AC & HS  . isosorbide mononitrate  30 mg Oral Daily  . levothyroxine  50 mcg Oral QAC breakfast  . losartan  50 mg Oral BID  . metoprolol  50 mg Oral BID  . pantoprazole  40 mg Oral Daily  . tamsulosin  0.4 mg Oral QPC breakfast   PRN:  acetaminophen, fluticasone, nitroGLYCERIN, nitroGLYCERIN, ondansetron (ZOFRAN) IV  Diet:  Carb Control thin liquids Activity:  As tolerated DVT Prophylaxis:  Heparin 5000 units sq tid   CLINICALLY SIGNIFICANT STUDIES Basic Metabolic Panel:  Recent Labs Lab 01/29/13 2132 01/30/13 1158  NA 136  --   K 3.7  --   CL 96  --   CO2 28  --   GLUCOSE 100*  --   BUN 18  --   CREATININE 0.96 0.81  CALCIUM 10.3  --    Liver Function Tests: No results found for this basename: AST, ALT, ALKPHOS, BILITOT, PROT, ALBUMIN,  in the last 168 hours CBC:  Recent Labs Lab 01/29/13 2132 01/30/13 1158  WBC 13.8* 8.7  HGB 14.7 13.2  HCT 42.4 38.6*  MCV 90.4 90.8  PLT 287 264   Coagulation:  Recent Labs Lab 01/30/13 1159  LABPROT 12.8  INR 0.98   Cardiac Enzymes:  Recent Labs  Lab 01/30/13 1645 01/30/13 2315 01/31/13 0449  TROPONINI <0.30 <0.30 <0.30   Urinalysis: No results found for this basename: COLORURINE, APPERANCEUR, LABSPEC, PHURINE, GLUCOSEU, HGBUR, BILIRUBINUR, KETONESUR, PROTEINUR, UROBILINOGEN, NITRITE, LEUKOCYTESUR,  in the last 168 hours Lipid Panel    Component Value Date/Time   CHOL 147 01/30/2013 2315   TRIG 86 01/30/2013 2315   HDL 54 01/30/2013 2315   CHOLHDL 2.7 01/30/2013 2315   VLDL 17 01/30/2013 2315   LDLCALC 76 01/30/2013 2315   HgbA1C  Lab Results  Component Value Date   HGBA1C 6.5* 07/19/2012    Urine Drug Screen:   No results found for this basename: labopia, cocainscrnur, labbenz, amphetmu, thcu, labbarb    Alcohol Level: No results found for this basename: ETH,  in the last 168 hours   CT of the brain    MRI of the brain  01/30/2013   Multiple subcentimeter areas of restricted  diffusion without hemorrhage representing acute infarction likely related to a shower of emboli.    MRA of the brain    2D Echocardiogram    Carotid Doppler    CXR  01/29/2013   No active cardiopulmonary disease.  EKG  normal sinus rhythm, RBBB.   Therapy Recommendations   Physical Exam   Pleasant elderly caucasian male not in distress.Awake alert. Afebrile. Head is nontraumatic. Neck is supple without bruit. Hearing is normal. Cardiac exam no murmur or gallop. Lungs are clear to auscultation. Distal pulses are well felt. Neurological Exam ; awake alert oriented x3 with normal speech and language function. No aphasia, dysarthria or apraxia. Extraocular movements are full range without nystagmus. Partial right homonymous hemianopsia to bedside confrontational testing. Face is symmetric without weakness. Tongue is midline. Motor system exam reveals no upper or lower eczema to drift. Symmetric and equal strength in all studies. No focal weakness. Sensation appears preserved. Gait was not tested. ASSESSMENT [name] is a 79 y.o. male presenting with acute onset perioral/right face numbness and unsteadiness after cardiac cath. Imaging confirms a bilateral anterior and posterior infarcts, the largest being in the left thalamus. Infarcts felt to be embolic secondary to post cath complication.  On clopidogrel 75 mg orally every day prior to admission. Now on clopidogrel 75 mg orally every day for secondary stroke prevention. Patient with resultant right peripheral vision field cut, right sided numbness (hemisensory deficit). Work up underway.  Hypertension   CAD - s/p cath 11/11, CABG 2004 Hyperlipidemia, LDL 76, on lipitor PTA, now on lipitor, goal LDL < 100 (< 70 for diabetics) Seizure disorder L ICA stenosis Diabetes, HgbA1c pending, goal < 7.0  Hospital day # 2  TREATMENT/PLAN  Continue clopidogrel 75 mg orally every day for secondary stroke prevention.  No further stroke  workup indicated  PT and OT consults  Discharge therapy needs per PT/OT  Ok for discharge from stroke standpoint once therapy recs made and put into place  No driving at time of discharge due to field cut  Follow up Dr. Pearlean Brownie in 2 months.   Annie Main, MSN, RN, ANVP-BC, ANP-BC, [facility] Pager: [phone] 01/31/2013 8:26 AM  I have personally obtained a history, examined the patient, evaluated imaging results, and formulated the assessment and plan of care. I agree with the above. Delia Heady, MD

## 2013-01-31 NOTE — Progress Notes (Addendum)
Nurse in room for hourly rounding. Pt noticed to have markedly increased slurred speech and right side upper extremity weakness. Neuro assessment performed and documented in doc flowsheets. Annie Main with stroke team notified. Will lay patient flat and administer 500cc bolus. Will continue to monitor patient closely. Levonne Spiller, RN   After 500cc bolus, patient able to better move RUE but still complain of numbness and tingling. Pt still has markedly changed slurred speech that is difficult to understand at times. Per Annie Main will keep patient flat in bed (besides going to bathroom and eating) tonight and have PT reasses in the morning. Will continue to monitor closely. Levonne Spiller, RN

## 2013-01-31 NOTE — Progress Notes (Signed)
01/29/13 @ 1915:  Comment:  Notified by radiologist of pt's positive MRI findings. Spoke w/ Dr Thad Ranger w/ neuro-hospitalists service who also reviewed the study. Pt already on ASA and Plavix and per Dr Thad Ranger it is unlikely pt will need complete stroke w/u since this event likely occurred during cardiac cath. She did say the stroke team would round on pt in am. I have spoken with the pt regarding his MRI results and that the stroke team would be in this am to discuss plan w/ him in more detail. Pt admits he still has some mild weakness in his RUE but does feel his symptoms have improved. Will continue to monitor closely.  Leanne Chang, NP-C [facility] Pager [phone]

## 2013-01-31 NOTE — Evaluation (Signed)
Occupational Therapy Evaluation Patient Details Name: [name] MRN: [id number] DOB: 05/07/1933 Today's Date: 01/31/2013 Time: 5784-6962 OT Time Calculation (min): 50 min  OT Assessment / Plan / Recommendation History of present illness 79 year old male with history of CAD, dyslipidemia, hypothyroidism, NSTEMI 2004 presented to the ED with left-sided chest pain. EKG showed nonspecific changes and cardiac enzymes remain negative. Cardiology was consulted and due to concern for unstable angina, patient underwent cardiac cath on 11/11 2014 which showed moderate diffuse disease. D1 disease looked like it could potentially cause exertional chest pain but according to cardiology intervention would be difficult given the disease in the LAD at the one takeoff. Cardiology recommended medical management and added Imdur with plans to revisit intervention on D1 if he has further chest pain. Post cath, patient developed CVA with associated right-sided numbness, weakness and slurred speech. Neurology was consulted.    Clinical Impression   Pt on bedrest requiring him to lay flat and not be able to participate in fx'l mobility or transfer assmt. Pt was agreeable for UE assmt and vision assmt. Pt w/asymmetrical UE coordination. Pt did have full ROM at all joints but with dec coordination, dec proprioception, dec kinesthesia and dec sensation noted throughout. Pt able to track all quadrants but unable to identify objects in R visual field either quadrant. Family notes that earlier pt leaning to right and requiring 2 person assist to transfer to chair requiring family to assist w/RLE advancement. Ed provided to family re: visual field cuts, CVA recovery and rehab process. Highly recommend CIR to maximize pt (I), fx'l use of RUE/LLE, balance, safety and visual/perceptual deficits.    OT Assessment  Patient needs continued OT Services    Follow Up Recommendations  CIR    Barriers to Discharge      Equipment  Recommendations       Recommendations for Other Services Rehab consult;Speech consult  Frequency  Min 3X/week    Precautions / Restrictions Precautions Precautions: Other (comment) (lay flat til 11/13 AM) Restrictions Weight Bearing Restrictions: No   Pertinent Vitals/Pain denies    ADL       OT Diagnosis: Generalized weakness;Hemiplegia dominant side;Apraxia;Ataxia;Paresis  OT Problem List: Decreased strength;Impaired balance (sitting and/or standing);Impaired vision/perception;Decreased coordination;Decreased safety awareness;Impaired sensation;Impaired UE functional use OT Treatment Interventions: Self-care/ADL training;Therapeutic exercise;Neuromuscular education;DME and/or AE instruction;Therapeutic activities;Visual/perceptual remediation/compensation;Patient/family education;Balance training   OT Goals(Current goals can be found in the care plan section) Acute Rehab OT Goals Patient Stated Goal: regain fx'l (I) and go home w/wife OT Goal Formulation: With patient/family Time For Goal Achievement: 03/02/13 Potential to Achieve Goals: Good ADL Goals Pt Will Perform Upper Body Dressing: with min assist Pt Will Perform Lower Body Dressing: with mod assist Pt Will Transfer to Toilet: with mod assist Pt Will Perform Toileting - Clothing Manipulation and hygiene: with mod assist  Visit Information  Last OT Received On: 01/31/13 Assistance Needed: +2 History of Present Illness: 79yo s/p heart cath w/apparent CVA post op       Prior Functioning     Home Living Family/patient expects to be discharged to:: Inpatient rehab Living Arrangements: Spouse/significant other Prior Function Level of Independence: Independent Communication Communication: Other (comment) (dysarthric speech) Dominant Hand: Right         Vision/Perception Vision - History Baseline Vision: No visual deficits Vision - Assessment Eye Alignment: Within Functional Limits Vision Assessment:  Vision tested Tracking/Visual Pursuits: Able to track stimulus in all quads without difficulty Visual Fields: Right homonymous hemianopsia;Right visual field deficit Perception  Perception: Impaired Inattention/Neglect: Does not attend to right visual field (dec R side awareness) Praxis Praxis: Impaired Praxis Impairment Details: Motor planning   Cognition  Cognition Arousal/Alertness: Awake/alert Behavior During Therapy: WFL for tasks assessed/performed Overall Cognitive Status: Within Functional Limits for tasks assessed    Extremity/Trunk Assessment Upper Extremity Assessment Upper Extremity Assessment: RUE deficits/detail RUE Deficits / Details: numbness noted throughout, dec coordination/kinesthesia/proprioception, 3+/5 strength throughout RUE Sensation: decreased light touch;decreased proprioception RUE Coordination: decreased fine motor;decreased gross motor Lower Extremity Assessment Lower Extremity Assessment: Defer to PT evaluation     Mobility       Exercise     Balance     End of Session OT - End of Session Activity Tolerance: Treatment limited secondary to medical complications (Comment) (pt to lie flat in bed til 11/13 AM) Patient left: in chair;with family/visitor present Nurse Communication: Other (comment) (CIR rec)  GO     Chorosevic, Deidre Ala 01/31/2013, 4:19 PM

## 2013-01-31 NOTE — Progress Notes (Signed)
[facility] PROGRESS NOTE    [name] NFA:[id number] DOB: [birth year] DOA: 01/29/2013 PCP: [name], MD  HPI/Brief narrative 79 year old male with history of CAD, dyslipidemia, hypothyroidism, NSTEMI 2004 presented to the ED with left-sided chest pain. EKG showed nonspecific changes and cardiac enzymes remain negative. Cardiology was consulted and due to concern for unstable angina, patient underwent cardiac cath on 11/11 2014 which showed moderate diffuse disease. D1 disease looked like it could potentially cause exertional chest pain but according to cardiology intervention would be difficult given the disease in the LAD at the one takeoff. Cardiology recommended medical management and added Imdur with plans to revisit intervention on D1 if he has further chest pain. Post cath, patient developed CVA with associated right-sided numbness, weakness and slurred speech. Neurology was consulted.  Assessment/Plan:  Unstable angina/history of NSTEMI/coronary artery disease - Status post cardiac cath on 01/30/13 and results as below. - Medical management as per cardiology an outpatient followup.  Acute CVA-bilateral anterior and posterior infarcts, largest being in the left thalamus - Infarcts felt to be embolic secondary to post cath complications. - Neurology consultation and stroke team followup appreciated. - Continue clopidogrel for secondary stroke prevention. Continue Lipitor. - Await PT, OT evaluation. - Patient developed some worsening slurred speech and right-sided symptoms later this afternoon which again have improved after laying in supine and IV fluid bolus as per neurology recommendations. Monitor closely - No driving at time of discharge due to field cut. - Followup with Dr. Pearlean Brownie in 2 months.  Hypothyroidism - Continue Synthroid.  Dyslipidemia - Controlled. Continue statins.  Hypertension - Controlled.  Type II DM - Hemoglobin A1c 6.5. - Metformin  at discharge. Continue SSI   DVT prophylaxis: Heparin Lines/catheters: PIV Nutrition: Diabetic diet  Activity:  Up with assistance Code Status: Full Family Communication: Discussed with Spouse and daughter at bedside. Disposition Plan: To be determined   Consultants:  Cardiology  Neurology  Procedures:  Cardiac Cath 11/11 Final Conclusions: Patent RCA stent. There was 40-50% proximal LAD stenosis at D1 with 70-80% ostial D1 stenosis. D1 disease looks like it could potentially cause exertional chest pain, but would be surprised if this could have caused the 2 hours of chest pain at rest he had yesterday. I reviewed with Dr. [place]: intervention would be difficult given the disease in the LAD at D1 take-off, could potentially perform balloon angioplasty. Will plan on medical management. Will add Imdur to regimen. If he has further chest pain, could re-visit intervention on D1.   Antibiotics:  None   Subjective: Per patient and family, slurred speech and right-sided numbness had improved this morning only to worsen later this afternoon.  Objective: Filed Vitals:   01/31/13 0715 01/31/13 0942 01/31/13 1221 01/31/13 1452  BP: 125/62 145/71 113/60 122/70  Pulse: 62 79 70 75  Temp: 98.3 F (36.8 C)  98.4 F (36.9 C)   TempSrc:   Oral   Resp: 18  17 18   Height:      Weight:      SpO2: 97%  91% 95%   No intake or output data in the 24 hours ending 01/31/13 1534 Filed Weights   01/30/13 0115  Weight: 100.789 kg (222 lb 3.2 oz)     Exam:  General exam: Moderately built and nourished male sitting at edge of bed in no obvious distress. Respiratory system: Clear. No increased work of breathing. Cardiovascular system: S1 & S2 heard, RRR. No JVD, murmurs, gallops, clicks or pedal edema. Gastrointestinal system:  Abdomen is nondistended, soft and nontender. Normal bowel sounds heard. Central nervous system: Alert and oriented. Right diminished nasolabial fold. Mild  dysarthria. Extremities: 5 x 5 power left limbs. 4 x 5 power right upper extremity and 4+/5 power right lower extremity   Data Reviewed: Basic Metabolic Panel:  Recent Labs Lab 01/29/13 2132 01/30/13 1158  NA 136  --   K 3.7  --   CL 96  --   CO2 28  --   GLUCOSE 100*  --   BUN 18  --   CREATININE 0.96 0.81  CALCIUM 10.3  --    Liver Function Tests: No results found for this basename: AST, ALT, ALKPHOS, BILITOT, PROT, ALBUMIN,  in the last 168 hours No results found for this basename: LIPASE, AMYLASE,  in the last 168 hours No results found for this basename: AMMONIA,  in the last 168 hours CBC:  Recent Labs Lab 01/29/13 2132 01/30/13 1158  WBC 13.8* 8.7  HGB 14.7 13.2  HCT 42.4 38.6*  MCV 90.4 90.8  PLT 287 264   Cardiac Enzymes:  Recent Labs Lab 01/30/13 1158 01/30/13 1645 01/30/13 2315 01/31/13 0449 01/31/13 1154  TROPONINI <0.30 <0.30 <0.30 <0.30 <0.30   BNP (last 3 results) No results found for this basename: PROBNP,  in the last 8760 hours CBG:  Recent Labs Lab 01/30/13 1140 01/30/13 1631 01/30/13 2135 01/31/13 0907 01/31/13 1149  GLUCAP 120* 129* 166* 124* 145*    Recent Results (from the past 240 hour(s))  MRSA PCR SCREENING     Status: None   Collection Time    01/30/13  1:31 AM      Result Value Range Status   MRSA by PCR NEGATIVE  NEGATIVE Final   Comment:            The GeneXpert MRSA Assay (FDA     approved for NASAL specimens     only), is one component of a     comprehensive MRSA colonization     surveillance program. It is not     intended to diagnose MRSA     infection nor to guide or     monitor treatment for     MRSA infections.      Additional labs: 1. Fasting lipids: Cholesterol 147, triglycerides 86, HDL 54, LDL 76, VLDL 17 2. Hemoglobin A1c: 6.5 3. TSH: 4.465 4. 2-D echo in 01/31/13: Study Conclusions  - Left ventricle: The cavity size was normal. There was mild focal basal and mild concentric hypertrophy of  the septum. Systolic function was normal. The estimated ejection fraction was in the range of 50% to 55%. Wall motion was normal; there were no regional wall motion abnormalities. Doppler parameters are consistent with abnormal left ventricular relaxation (grade 1 diastolic dysfunction). - Aortic valve: Calcified annulus. Mildly thickened leaflets. Trivial regurgitation. - Mitral valve: Calcified annulus. Mildly thickened leaflets. - Left atrium: The atrium was mildly dilated. - Right ventricle: The cavity size was mildly dilated. - Atrial septum: No defect or patent foramen ovale was identified. - Pulmonary arteries: PA peak pressure: 35mm Hg (S). 5. Carotid Doppler: Summary:  - The vertebral arteries appear patent with antegrade flow. - Findings consistent with 1- 39 percent stenosis involving the right internal carotid artery. - Findings consistent with 60 - 79 percent stenosis, low end of scale,involving the left internal carotid artery. - Due to calcific plaque with acoustic shadowing, higher grade stenosis may be obscured bilaterally.  Studies: Dg Chest 2 View  01/29/2013   CLINICAL DATA:  Chest pain.  EXAM: CHEST  2 VIEW  COMPARISON:  December 03, 2012.  FINDINGS: The heart size and mediastinal contours are within normal limits. Both lungs are clear. No pleural effusion or pneumothorax is noted. Mild anterior osteophyte formation is seen involving mid thoracic spine.  IMPRESSION: No active cardiopulmonary disease.   Electronically Signed   By: Roque Lias M.D.   On: 01/29/2013 21:43   Mr Maxine Glenn Head Wo Contrast  01/31/2013   CLINICAL DATA:  Multiple infarctions  EXAM: MRA HEAD WITHOUT CONTRAST  TECHNIQUE: Angiographic images of the Circle of Willis were obtained using MRA technique without intravenous contrast.  COMPARISON:  01/30/2013  FINDINGS: Both internal carotid arteries are widely patent into the brain. No siphon stenosis. The anterior and middle cerebral vessels are  patent without proximal stenosis, aneurysm or vascular malformation.  Both vertebral arteries are patent with the right being larger than the left. There is some atherosclerotic irregularity, particularly of the right vertebral artery. No flow-limiting stenosis.  The basilar artery shows slight atherosclerotic irregularity but no stenosis.  The left posterior cerebral artery is occluded 1.5 cm beyond its origin. There is severe stenosis of the right PCA 1 cm beyond its origin but the vessel does show persistent flow distally.  IMPRESSION: Occlusion of the left posterior cerebral artery 1.5 cm beyond its origin. Severe stenosis of the right PCA 1 cm beyond its origin. Atherosclerotic irregularity of the dominant right vertebral artery without of visible flow-limiting stenosis .   Electronically Signed   By: Paulina Fusi M.D.   On: 01/31/2013 09:04   [name]  01/30/2013   CLINICAL DATA:  Perioral numbness following cardiac catheterization. Slurred speech.  EXAM: MRI HEAD WITHOUT CONTRAST  TECHNIQUE: Multiplanar, multiecho pulse sequences of the brain and surrounding structures were obtained without intravenous contrast.  COMPARISON:  12/04/2012 MRI from [facility].  FINDINGS: Multiple subcentimeter areas of restricted diffusion representing acute infarction are seen throughout both hemispheres, most notably the left thalamus, without hemorrhage. These are new from the prior MRI in September and represent what are likely multiple emboli.  No mass lesion, hydrocephalus, or extra-axial fluid.  Moderate cerebral and cerebellar atrophy. Mild subcortical and periventricular T2 and FLAIR hyperintensities, likely chronic microvascular ischemic change. Flow voids are maintained throughout the carotid, basilar, and vertebral arteries. There are no areas of chronic hemorrhage. Pituitary, pineal, and cerebellar tonsils unremarkable. No upper cervical lesions. Visualized calvarium, skull base, and upper  cervical osseous structures unremarkable. Scalp, orbits, sinuses, and mastoids show no acute process.  Incidental note is made of a superficial lobe left parotid ovoid lesion 5 x 10 mm cross-section probably a lymph node, stable from September.  IMPRESSION: Multiple subcentimeter areas of restricted diffusion without hemorrhage representing acute infarction likely related to a shower of emboli.  These results will be called to the ordering clinician or representative by the Radiologist Assistant, and communication documented in the PACS Dashboard.   Electronically Signed   By: Davonna Belling M.D.   On: 01/30/2013 19:04        Scheduled Meds: . atorvastatin  80 mg Oral Daily  . carbamazepine  300 mg Oral BID  . cloNIDine  0.2 mg Oral Daily  . clopidogrel  75 mg Oral Daily  . diazepam  2 mg Oral BID  . heparin  5,000 Units Subcutaneous Q8H  . hydrochlorothiazide  25 mg Oral Daily  . insulin  aspart  0-9 Units Subcutaneous TID AC & HS  . isosorbide mononitrate  30 mg Oral Daily  . levothyroxine  50 mcg Oral QAC breakfast  . losartan  50 mg Oral BID  . metoprolol  50 mg Oral BID  . pantoprazole  40 mg Oral Daily  . tamsulosin  0.4 mg Oral QPC breakfast   Continuous Infusions: . sodium chloride Stopped (01/31/13 1159)    Principal Problem:   Chest pain Active Problems:   Coronary artery disease    Time spent: 40 minutes.    Marcellus Scott, MD, FACP, FHM. [facility] Pager [phone]  If 7PM-7AM, please contact night-coverage [email or web address] Password [facility] 01/31/2013, 3:34 PM    LOS: 2 days

## 2013-01-31 NOTE — Progress Notes (Signed)
*  PRELIMINARY RESULTS* Vascular Ultrasound Carotid Duplex (Doppler) has been completed.  Preliminary findings: Right = 1-39% ICA stenosis. Left = 60-79% ICA stenosis, low end of scale.  Stenosis on the left side has mildly increased since previous study 10/2012.    Farrel Demark, RDMS, RVT  01/31/2013, 8:40 AM

## 2013-01-31 NOTE — Progress Notes (Signed)
Rehab Admissions Coordinator Note:  Patient was screened by Clois Dupes for appropriateness for an Inpatient Acute Rehab Consult.  At this time, we are recommending await PT and full OT evaluation once pt off bedrest so that inpt rehab consult can be based on current functional level to determine appropriate rehab venue .  Clois Dupes 01/31/2013, 5:23 PM  I can be reached at [phone].

## 2013-01-31 NOTE — Progress Notes (Signed)
Echocardiogram 2D Echocardiogram has been performed.  BROWN, CALEBA 01/31/2013, 8:47 AM

## 2013-01-31 NOTE — Progress Notes (Signed)
CARDIOLOGY CONSULT NOTE   Patient ID: [name] MRN: [id number] DOB/AGE: 10-28-1933 79 y.o.  Admit date: 01/29/2013  Primary Physician   [name], MD Primary Cardiologist   [facility] Reason for Consultation   Chest pain  NWG:NFAOZHY [name] is a 79 y.o. male with a history of CAD.  He has had exertional chest pain, assessed with Lexiscan in 2013 and GXT in Feb. 2014, results below.   He had onset of chest pain, pressure, yesterday. It was not positional, no change with deep inspiration. The pain began about 3 pm, 2 hours after exertion. He had indigestion after he ate, but that was before the chest pain began. It was a pressure, at a 3 or 4/10. The symptoms waxed and waned, but did not resolve. He did not take any medications for the pain. He had no SOB, N&V or diaphoresis. He came to the ER last pm because of ongoing pain. He was given SL NTG x 1, the pain decreased immediately and resolved in about 30 more minutes. There were not associated symptoms, but the pain reminded him of his MI/pre-PCI symptoms, more so than previous episodes of pain. It scared him.   Cath yesterday revealed moderate diffuse disease - likely not the source of his CP.  Unfortunately, he had a CVA after his cath.   Past Medical History  Diagnosis Date  . Coronary artery disease     s/p MI tx with Cypher DES to pRCA in 4/04; LHC 4/04 in the setting of his MI: Proximal LAD 30%, ostial diagonal 40-50%, ostial OM1 30%, proximal RCA 95% which was the infarct vessel   ;    Echocardiogram 7/09: Normal LV function.  Nuclear study  March 2013 negative for ischemia  . Dyslipidemia   . Seizure disorder   . Hypothyroidism   . DDD (degenerative disc disease)   . Hx MRSA infection     left buttocks abscess  . Rotator cuff injury     chronic rotator cuff injury status post repair  . Hypertension   . Syncope   . Dizziness   . Meniere's disease     Status post shunt  . BPH (benign prostatic hyperplasia)     . Stenosis of left internal carotid artery     40-59% on carotid doppler 2014  . Diabetes mellitus without complication   . Hyperlipidemia      Past Surgical History  Procedure Laterality Date  . Coronary angioplasty with stent placement  07/14/2002    Stent to the right coronary artery  . Rotator cuff repair      for chronic rotator cuff injury  . Hernia repair  04/10/2008    scrotal hernia repair  . Endolymphatic shunt decompression  06/11/2009     Right endolymphatic sac decompression and shunt  placement    No Known Allergies  I have reviewed the patient's current medications .  stroke: mapping our early stages of recovery book   Does not apply Once  . atorvastatin  80 mg Oral Daily  . carbamazepine  300 mg Oral BID  . cloNIDine  0.2 mg Oral Daily  . clopidogrel  75 mg Oral Daily  . diazepam  2 mg Oral BID  . heparin  5,000 Units Subcutaneous Q8H  . hydrochlorothiazide  25 mg Oral Daily  . insulin aspart  0-9 Units Subcutaneous TID AC & HS  . isosorbide mononitrate  30 mg Oral Daily  . levothyroxine  50 mcg  Oral QAC breakfast  . losartan  50 mg Oral BID  . metoprolol  50 mg Oral BID  . pantoprazole  40 mg Oral Daily  . tamsulosin  0.4 mg Oral QPC breakfast   . sodium chloride 75 mL/hr at 01/31/13 0520   acetaminophen, fluticasone, nitroGLYCERIN, nitroGLYCERIN, ondansetron (ZOFRAN) IV  Prior to Admission medications   Medication Sig Start Date End Date Taking? Authorizing Provider  atorvastatin (LIPITOR) 40 MG tablet Take 2 tablets (80 mg total) by mouth daily. 12/11/12  Yes Donita Brooks, MD  carbamazepine (CARBATROL) 300 MG 12 hr capsule Take 300 mg by mouth 2 (two) times daily.   Yes Historical Provider, MD  cloNIDine (CATAPRES) 0.2 MG tablet Take 0.2 mg by mouth daily.   Yes Historical Provider, MD  clopidogrel (PLAVIX) 75 MG tablet Take 1 tablet (75 mg total) by mouth daily. 12/11/12  Yes Donita Brooks, MD  diazepam (VALIUM) 2 MG tablet Take 2 mg by mouth  2 (two) times daily.   Yes Historical Provider, MD  fluticasone (FLONASE) 50 MCG/ACT nasal spray Place 1 spray into the nose daily as needed for allergies.  05/12/11  Yes Historical Provider, MD  hydrochlorothiazide (HYDRODIURIL) 25 MG tablet Take 1 tablet (25 mg total) by mouth daily. 07/28/12  Yes Donita Brooks, MD  levothyroxine (SYNTHROID, LEVOTHROID) 25 MCG tablet Take 50 mcg by mouth daily before breakfast.   Yes Historical Provider, MD  losartan (COZAAR) 50 MG tablet Take 1 tablet (50 mg total) by mouth 2 (two) times daily. 04/17/12  Yes Rollene Rotunda, MD  metFORMIN (GLUCOPHAGE) 500 MG tablet Take 500 mg by mouth 2 (two) times daily with a meal.   Yes Historical Provider, MD  metoprolol (LOPRESSOR) 50 MG tablet Take 50 mg by mouth 2 (two) times daily.   Yes Historical Provider, MD  Multiple Vitamins-Minerals (MULTIVITAMINS THER. W/MINERALS) TABS Take 1 tablet by mouth at bedtime.     Yes Historical Provider, MD  nitroGLYCERIN (NITROSTAT) 0.4 MG SL tablet Place 1 tablet (0.4 mg total) under the tongue every 5 (five) minutes as needed for chest pain. 12/21/12  Yes Donita Brooks, MD  silodosin (RAPAFLO) 8 MG CAPS capsule Take 8 mg by mouth daily with breakfast.    Yes Historical Provider, MD     History   Social History  . Marital Status: Married    Spouse Name: N/A    Number of Children: N/A  . Years of Education: N/A   Occupational History  . Retired    Social History Main Topics  . Smoking status: Former Smoker    Quit date: 08/19/1961  . Smokeless tobacco: Not on file  . Alcohol Use: No  . Drug Use: No  . Sexual Activity: Not on file   Other Topics Concern  . Not on file   Social History Narrative   Lives with wife.    Family Status  Relation Status Death Age  . Father Deceased 49    MI  . Mother Deceased 80's    of brain aneurysm  . Brother    . Brother     Family History  Problem Relation Age of Onset  . Heart attack Father 73  . Aneurysm Mother     brain  aneurysm  . Coronary artery disease Brother     with CABG     ROS:  Full 14 point review of systems complete and found to be negative unless listed above.  Physical Exam: Blood pressure 138/57,  pulse 63, temperature 98.5 F (36.9 C), temperature source Oral, resp. rate 18, height 5\' 7"  (1.702 m), weight 222 lb 3.2 oz (100.789 kg), SpO2 96.00%.  General: Well developed, well nourished, male in no acute distress Head: Eyes PERRLA, No xanthomas.   Normocephalic and atraumatic, oropharynx without edema or exudate. Dentition: good  Lungs: CTA bilaterally  Heart: HRRR S1 S2, no rub/gallop, Heart irregular rate and rhythm with S1, S2  murmur. pulses are 2+ extrem.   Neck: No carotid bruits. No lymphadenopathy.  JVD. Abdomen: Bowel sounds present, abdomen soft and non-tender without masses or hernias noted. Msk:  No spine or cva tenderness. No weakness, no joint deformities or effusions. Extremities: No clubbing or cyanosis. No  edema.  Neuro: Alert and oriented X 3. No focal deficits noted. Psych:  Good affect, responds appropriately Skin: No rashes or lesions noted.  Labs:   Lab Results  Component Value Date   WBC 8.7 01/30/2013   HGB 13.2 01/30/2013   HCT 38.6* 01/30/2013   MCV 90.8 01/30/2013   PLT 264 01/30/2013     Recent Labs Lab 01/29/13 2132 01/30/13 1158  NA 136  --   K 3.7  --   CL 96  --   CO2 28  --   BUN 18  --   CREATININE 0.96 0.81  CALCIUM 10.3  --   GLUCOSE 100*  --     Recent Labs  01/30/13 1158 01/30/13 1645 01/30/13 2315 01/31/13 0449  TROPONINI <0.30 <0.30 <0.30 <0.30    Recent Labs  01/29/13 2131  TROPIPOC 0.02   Echo: 10/10/2007 SUMMARY - The aortic root was at the upper limits of normal in size. There was mild fibrocalcific change of the aortic root. LV was grossly normal  ETT: 05/15/2012 ETT Interpretation: normal - no evidence of ischemia by ST  analysis Comments: The patient had an excellent exercise tolerance. There was no    chest pain. There was an appropriate level of dyspnea. There  were a few ventricular couplets with in recovery,a normal heart  rate response and normal BP response. There were no ischemic ST  T wave changes.  Recommendations: Negative adequate ETT. No further testing is indicated. Based  on the above I gave the patient a prescription for exercise.  Lexiscan Stress:  06/02/2012 QGS EF: 57%  Impression  Exercise Capacity: Lexiscan with no exercise.  BP Response: Normal blood pressure response.  Clinical Symptoms: Short of breath.  ECG Impression: No significant ST segment change suggestive of ischemia.  Comparison with Prior Nuclear Study: No significant change from previous study  Overall Impression: Normal stress nuclear study.   Cardiac Cath: 07/14/2002 Left Main: Short vessel, which is angiographically normal.   LAD: The LAD is a moderate size vessel giving rise to a single diagonal  branch. There is a 30% stenosis of the proximal vessel at the site of  the takeoff of the diagonal. This diagonal has approximately 40-50%  ostial stenosis.   Ramus Intermedius: Small vessel, which is angiographically normal.   Circumflex: The circumflex is a large vessel giving rise to two large  obtuse marginal branches. The first marginal has a 30% ostial stenosis.   RCA: The RCA is a very large vessel giving rise to a large PDA and large  posterior left ventricular branch. There is a thrombotic 95% stenosis of  the proximal vessel with associated TIMI II flow. This was successfully  Stented with a 3.5 x 18 mm CYPHER stent resulting in no  residual stenosis and TIMI III flow.   No aortic stenosis or mitral regurgitation.  ECG: 01/29/2013 SR, R wave much large in V2 than V3, ?abnl lead placement Vent. rate 72 BPM PR interval 182 ms QRS duration 110 ms QT/QTc 376/411 ms P-R-T axes 44 -25 118  Radiology:  Dg Chest 2 View 01/29/2013   CLINICAL DATA:  Chest pain.  EXAM: CHEST  2 VIEW   COMPARISON:  December 03, 2012.  FINDINGS: The heart size and mediastinal contours are within normal limits. Both lungs are clear. No pleural effusion or pneumothorax is noted. Mild anterior osteophyte formation is seen involving mid thoracic spine.  IMPRESSION: No active cardiopulmonary disease.   Electronically Signed   By: Roque Lias M.D.   On: 01/29/2013 21:43   Cath: Left mainstem: Short with no significant disease.  Left anterior descending (LAD): 40-50% proximal LAD stenosis at D1. D1 was moderate with 70-80% ostial stenosis.  Left circumflex (LCx): AV LCx with luminal irregularities. Small ramus without significant disease. 40% ostial OM1 stenosis.  Right coronary artery (RCA): There was a stent in the proximal RCA with minimal in-stent restenosis. 30% mid RCA stenosis.  Left ventriculography: Left ventricular systolic function is normal, LVEF is estimated at 60-65%, there is no significant mitral regurgitation, no wall motion abnormalities in the RAO projection.  Final Conclusions: Patent RCA stent. There was 40-50% proximal LAD stenosis at D1 with 70-80% ostial D1 stenosis. D1 disease looks like it could potentially cause exertional chest pain, but would be surprised if this could have caused the 2 hours of chest pain at rest he had yesterday. I reviewed with Dr. [place]: intervention would be difficult given the disease in the LAD at D1 take-off, could potentially perform balloon angioplasty. Will plan on medical management. Will add Imdur to regimen. If he has further chest pain, could re-visit intervention on D1.    ASSESSMENT AND PLAN:       Chest pain - his cath showed moderate diffuse disease.  The 1st diag is the tightest stenosis but this is right at the origin and I agree that medical therapy would be best.    Unfortunately, he had a CVA after the cath.  Further plans per neuro.  Vesta Mixer, Montez Hageman., MD, [facility] 01/31/2013, 7:06 AM Office - [phone] Pager  [phone]

## 2013-02-01 ENCOUNTER — Ambulatory Visit: Payer: Medicare Other | Admitting: Physician Assistant

## 2013-02-01 ENCOUNTER — Inpatient Hospital Stay (HOSPITAL_COMMUNITY)
Admission: RE | Admit: 2013-02-01 | Discharge: 2013-02-13 | DRG: 945 | Disposition: A | Discharge status: Home / Self Care | Payer: Medicare Other | Source: Intra-hospital | Attending: Physical Medicine & Rehabilitation | Admitting: Physical Medicine & Rehabilitation

## 2013-02-01 DIAGNOSIS — S0990XA Unspecified injury of head, initial encounter: Secondary | ICD-10-CM | POA: Diagnosis not present

## 2013-02-01 DIAGNOSIS — I639 Cerebral infarction, unspecified: Secondary | ICD-10-CM

## 2013-02-01 DIAGNOSIS — I10 Essential (primary) hypertension: Secondary | ICD-10-CM | POA: Diagnosis not present

## 2013-02-01 DIAGNOSIS — R279 Unspecified lack of coordination: Secondary | ICD-10-CM | POA: Diagnosis not present

## 2013-02-01 DIAGNOSIS — Z7902 Long term (current) use of antithrombotics/antiplatelets: Secondary | ICD-10-CM | POA: Diagnosis not present

## 2013-02-01 DIAGNOSIS — I6529 Occlusion and stenosis of unspecified carotid artery: Secondary | ICD-10-CM | POA: Diagnosis present

## 2013-02-01 DIAGNOSIS — Z5189 Encounter for other specified aftercare: Secondary | ICD-10-CM | POA: Diagnosis not present

## 2013-02-01 DIAGNOSIS — Z951 Presence of aortocoronary bypass graft: Secondary | ICD-10-CM

## 2013-02-01 DIAGNOSIS — E1165 Type 2 diabetes mellitus with hyperglycemia: Secondary | ICD-10-CM

## 2013-02-01 DIAGNOSIS — I252 Old myocardial infarction: Secondary | ICD-10-CM | POA: Diagnosis not present

## 2013-02-01 DIAGNOSIS — E1142 Type 2 diabetes mellitus with diabetic polyneuropathy: Secondary | ICD-10-CM | POA: Diagnosis present

## 2013-02-01 DIAGNOSIS — E039 Hypothyroidism, unspecified: Secondary | ICD-10-CM | POA: Diagnosis present

## 2013-02-01 DIAGNOSIS — G40909 Epilepsy, unspecified, not intractable, without status epilepticus: Secondary | ICD-10-CM | POA: Diagnosis present

## 2013-02-01 DIAGNOSIS — I2510 Atherosclerotic heart disease of native coronary artery without angina pectoris: Secondary | ICD-10-CM

## 2013-02-01 DIAGNOSIS — N400 Enlarged prostate without lower urinary tract symptoms: Secondary | ICD-10-CM | POA: Diagnosis present

## 2013-02-01 DIAGNOSIS — Z8614 Personal history of Methicillin resistant Staphylococcus aureus infection: Secondary | ICD-10-CM | POA: Diagnosis not present

## 2013-02-01 DIAGNOSIS — E785 Hyperlipidemia, unspecified: Secondary | ICD-10-CM

## 2013-02-01 DIAGNOSIS — I6350 Cerebral infarction due to unspecified occlusion or stenosis of unspecified cerebral artery: Secondary | ICD-10-CM | POA: Diagnosis not present

## 2013-02-01 DIAGNOSIS — Z9861 Coronary angioplasty status: Secondary | ICD-10-CM | POA: Diagnosis not present

## 2013-02-01 DIAGNOSIS — I6340 Cerebral infarction due to embolism of unspecified cerebral artery: Secondary | ICD-10-CM | POA: Diagnosis not present

## 2013-02-01 DIAGNOSIS — E1149 Type 2 diabetes mellitus with other diabetic neurological complication: Secondary | ICD-10-CM | POA: Diagnosis present

## 2013-02-01 DIAGNOSIS — I6330 Cerebral infarction due to thrombosis of unspecified cerebral artery: Secondary | ICD-10-CM

## 2013-02-01 DIAGNOSIS — Z87891 Personal history of nicotine dependence: Secondary | ICD-10-CM

## 2013-02-01 DIAGNOSIS — G459 Transient cerebral ischemic attack, unspecified: Secondary | ICD-10-CM

## 2013-02-01 DIAGNOSIS — R278 Other lack of coordination: Secondary | ICD-10-CM

## 2013-02-01 DIAGNOSIS — R569 Unspecified convulsions: Secondary | ICD-10-CM

## 2013-02-01 DIAGNOSIS — I200 Unstable angina: Secondary | ICD-10-CM

## 2013-02-01 DIAGNOSIS — IMO0001 Reserved for inherently not codable concepts without codable children: Secondary | ICD-10-CM

## 2013-02-01 LAB — GLUCOSE, CAPILLARY
Glucose-Capillary: 142 mg/dL — ABNORMAL HIGH (ref 70–99)
Glucose-Capillary: 124 mg/dL — ABNORMAL HIGH (ref 70–99)
Glucose-Capillary: 152 mg/dL — ABNORMAL HIGH (ref 70–99)
Glucose-Capillary: 122 mg/dL — ABNORMAL HIGH (ref 70–99)
Glucose-Capillary: 145 mg/dL — ABNORMAL HIGH (ref 70–99)

## 2013-02-01 LAB — CREATININE, SERUM
Creatinine, Ser: 0.88 mg/dL (ref 0.50–1.35)
GFR calc Af Amer: >90 mL/min (ref >90)
GFR calc non Af Amer: 81 mL/min — ABNORMAL LOW (ref >90)

## 2013-02-01 LAB — CBC
HCT: 37.7 % — ABNORMAL LOW (ref 39.0–52.0)
Hemoglobin: 12.7 g/dL — ABNORMAL LOW (ref 13.0–17.0)
MCH: 30.7 pg (ref 26.0–34.0)
MCHC: 33.7 g/dL (ref 30.0–36.0)
MCV: 91.1 fL (ref 78.0–100.0)
Platelets: 259 10*3/uL (ref 150–400)
RBC: 4.14 MIL/uL — ABNORMAL LOW (ref 4.22–5.81)
RDW: 14.1 % (ref 11.5–15.5)
WBC: 9.1 10*3/uL (ref 4.0–10.5)

## 2013-02-01 LAB — TROPONIN I: Troponin I: <0.3 ng/mL (ref <0.30)

## 2013-02-01 MED ORDER — INSULIN ASPART 100 UNIT/ML ~~LOC~~ SOLN
0.0000 [IU] | Freq: Three times a day (TID) | SUBCUTANEOUS | Status: DC
  Administered 2013-02-02: 1 [IU] via SUBCUTANEOUS
  Administered 2013-02-02 (×2): 2 [IU] via SUBCUTANEOUS
  Administered 2013-02-03 – 2013-02-08 (×15): 1 [IU] via SUBCUTANEOUS
  Administered 2013-02-08: 3 [IU] via SUBCUTANEOUS
  Administered 2013-02-08 – 2013-02-13 (×12): 1 [IU] via SUBCUTANEOUS

## 2013-02-01 MED ORDER — SORBITOL 70 % SOLN
30.0000 mL | Freq: Every day | Status: DC | PRN
  Administered 2013-02-02: 30 mL via ORAL
  Filled 2013-02-01: qty 30

## 2013-02-01 MED ORDER — INSULIN ASPART 100 UNIT/ML ~~LOC~~ SOLN: 0.0000 [IU] | Freq: Every day | SUBCUTANEOUS | Status: DC

## 2013-02-01 MED ORDER — SENNA 8.6 MG PO TABS
2.0000 | ORAL_TABLET | Freq: Every day | ORAL | Status: DC
  Administered 2013-02-02 – 2013-02-13 (×11): 17.2 mg via ORAL
  Filled 2013-02-01 (×15): qty 2

## 2013-02-01 MED ORDER — ACETAMINOPHEN 325 MG PO TABS: 650.0000 mg | ORAL_TABLET | ORAL | Status: DC | PRN

## 2013-02-01 MED ORDER — SENNA 8.6 MG PO TABS: 2.0000 | ORAL_TABLET | Freq: Every day | ORAL | Status: DC

## 2013-02-01 MED ORDER — POLYETHYLENE GLYCOL 3350 17 G PO PACK
17.0000 g | PACK | Freq: Every day | ORAL | Status: DC
  Administered 2013-02-01: 17 g via ORAL
  Filled 2013-02-01: qty 1

## 2013-02-01 MED ORDER — BISACODYL 10 MG RE SUPP: 10.0000 mg | Freq: Every day | RECTAL | Status: DC | PRN

## 2013-02-01 MED ORDER — FLUTICASONE PROPIONATE 50 MCG/ACT NA SUSP
1.0000 | Freq: Every day | NASAL | Status: DC | PRN
  Filled 2013-02-01: qty 16

## 2013-02-01 MED ORDER — POLYETHYLENE GLYCOL 3350 17 G PO PACK
17.0000 g | PACK | Freq: Every day | ORAL | Status: DC
  Administered 2013-02-02 – 2013-02-12 (×10): 17 g via ORAL
  Filled 2013-02-01 (×13): qty 1

## 2013-02-01 MED ORDER — PANTOPRAZOLE SODIUM 40 MG PO TBEC
40.0000 mg | DELAYED_RELEASE_TABLET | Freq: Every day | ORAL | Status: DC
  Administered 2013-02-02 – 2013-02-13 (×12): 40 mg via ORAL
  Filled 2013-02-01 (×12): qty 1

## 2013-02-01 MED ORDER — LOSARTAN POTASSIUM 50 MG PO TABS
50.0000 mg | ORAL_TABLET | Freq: Two times a day (BID) | ORAL | Status: DC
  Administered 2013-02-01 – 2013-02-13 (×24): 50 mg via ORAL
  Filled 2013-02-01 (×26): qty 1

## 2013-02-01 MED ORDER — CLONIDINE HCL 0.2 MG PO TABS
0.2000 mg | ORAL_TABLET | Freq: Every day | ORAL | Status: DC
  Administered 2013-02-02 – 2013-02-13 (×12): 0.2 mg via ORAL
  Filled 2013-02-01 (×13): qty 1

## 2013-02-01 MED ORDER — INSULIN ASPART 100 UNIT/ML ~~LOC~~ SOLN: 0.0000 [IU] | Freq: Three times a day (TID) | SUBCUTANEOUS | Status: DC

## 2013-02-01 MED ORDER — CLOPIDOGREL BISULFATE 75 MG PO TABS
75.0000 mg | ORAL_TABLET | Freq: Every day | ORAL | Status: DC
  Administered 2013-02-02 – 2013-02-13 (×12): 75 mg via ORAL
  Filled 2013-02-01 (×13): qty 1

## 2013-02-01 MED ORDER — DIAZEPAM 2 MG PO TABS
2.0000 mg | ORAL_TABLET | Freq: Two times a day (BID) | ORAL | Status: DC
  Administered 2013-02-01 – 2013-02-13 (×24): 2 mg via ORAL
  Filled 2013-02-01 (×24): qty 1

## 2013-02-01 MED ORDER — HEPARIN SODIUM (PORCINE) 5000 UNIT/ML IJ SOLN
5000.0000 [IU] | Freq: Three times a day (TID) | INTRAMUSCULAR | Status: DC
  Administered 2013-02-01 – 2013-02-13 (×35): 5000 [IU] via SUBCUTANEOUS
  Filled 2013-02-01 (×39): qty 1

## 2013-02-01 MED ORDER — SENNA 8.6 MG PO TABS
2.0000 | ORAL_TABLET | Freq: Every day | ORAL | Status: DC
Start: 2013-02-01 — End: 2013-02-01
  Administered 2013-02-01: 17.2 mg via ORAL
  Filled 2013-02-01: qty 2

## 2013-02-01 MED ORDER — TAMSULOSIN HCL 0.4 MG PO CAPS
0.4000 mg | ORAL_CAPSULE | Freq: Every day | ORAL | Status: DC
  Administered 2013-02-02 – 2013-02-13 (×12): 0.4 mg via ORAL
  Filled 2013-02-01 (×13): qty 1

## 2013-02-01 MED ORDER — LEVOTHYROXINE SODIUM 50 MCG PO TABS
50.0000 ug | ORAL_TABLET | Freq: Every day | ORAL | Status: DC
  Administered 2013-02-02 – 2013-02-13 (×12): 50 ug via ORAL
  Filled 2013-02-01 (×14): qty 1

## 2013-02-01 MED ORDER — HYDROCHLOROTHIAZIDE 25 MG PO TABS
25.0000 mg | ORAL_TABLET | Freq: Every day | ORAL | Status: DC
  Administered 2013-02-02 – 2013-02-13 (×12): 25 mg via ORAL
  Filled 2013-02-01 (×13): qty 1

## 2013-02-01 MED ORDER — ISOSORBIDE MONONITRATE ER 30 MG PO TB24
30.0000 mg | ORAL_TABLET | Freq: Every day | ORAL | Status: DC
  Administered 2013-02-02 – 2013-02-13 (×12): 30 mg via ORAL
  Filled 2013-02-01 (×13): qty 1

## 2013-02-01 MED ORDER — ONDANSETRON HCL 4 MG/2ML IJ SOLN: 4.0000 mg | Freq: Four times a day (QID) | INTRAMUSCULAR | Status: DC | PRN

## 2013-02-01 MED ORDER — HEPARIN SODIUM (PORCINE) 5000 UNIT/ML IJ SOLN: 5000.0000 [IU] | Freq: Three times a day (TID) | INTRAMUSCULAR | Status: DC

## 2013-02-01 MED ORDER — METOPROLOL TARTRATE 50 MG PO TABS
50.0000 mg | ORAL_TABLET | Freq: Two times a day (BID) | ORAL | Status: DC
  Administered 2013-02-01 – 2013-02-13 (×24): 50 mg via ORAL
  Filled 2013-02-01 (×26): qty 1

## 2013-02-01 MED ORDER — POLYETHYLENE GLYCOL 3350 17 G PO PACK: 17.0000 g | PACK | Freq: Every day | ORAL | Status: DC

## 2013-02-01 MED ORDER — CARBAMAZEPINE ER 200 MG PO TB12
300.0000 mg | ORAL_TABLET | Freq: Two times a day (BID) | ORAL | Status: DC
  Administered 2013-02-01 – 2013-02-13 (×24): 300 mg via ORAL
  Filled 2013-02-01 (×26): qty 1

## 2013-02-01 MED ORDER — ONDANSETRON HCL 4 MG PO TABS: 4.0000 mg | ORAL_TABLET | Freq: Four times a day (QID) | ORAL | Status: DC | PRN

## 2013-02-01 MED ORDER — NITROGLYCERIN 0.4 MG SL SUBL: 0.4000 mg | SUBLINGUAL_TABLET | SUBLINGUAL | Status: DC | PRN

## 2013-02-01 MED ORDER — ATORVASTATIN CALCIUM 80 MG PO TABS
80.0000 mg | ORAL_TABLET | Freq: Every day | ORAL | Status: DC
  Administered 2013-02-02 – 2013-02-13 (×12): 80 mg via ORAL
  Filled 2013-02-01 (×13): qty 1

## 2013-02-01 NOTE — Progress Notes (Signed)
Occupational Therapy Treatment Patient Details Name: [name] MRN: [id number] DOB: 08/18/1933 Today's Date: 02/01/2013 Time: 4098-1191 OT Time Calculation (min): 35 min  OT Assessment / Plan / Recommendation  History of present illness  (79yo s/p heart cath w/postop CVA)   OT comments  Pt off bed rest today. Pt eating lunch upon entering room w/dtr feeding him. Encouraged dtr to allow pt to try to feed self. He was unable to hold utensil in R hand. Once placed by therapist he needed min A to bring food to mouth. Pt encouraged to feed self w/L hand. Pt needed mod A sup>sit EOB but then min guard/close stand by for sitting balance w/o UE support. He was able to stand w/min A for sit>stand but strong min A, tactile and verbal cues to remain in midline. Pt unable to keep body still in standing w/tendency to lean forward and to the R. Pt able to side step along EOB w/min to L, mod A for R. He performed SPT to [facility] w/mod A to R and min A to L. Pt needed physical assist for RUE placement on armrest of BSC. He was unable to maintain fx'l grasp. Pt continues to be well motivated to regain fx'l (I). Wife & dtrs are supportive. Wife will need add'l stroke education. Verbal education provided re: rehab process and stroke recovery.   Follow Up Recommendations  CIR    Barriers to Discharge       Equipment Recommendations       Recommendations for Other Services Rehab consult;Speech consult  Frequency Min 3X/week   Progress towards OT Goals Progress towards OT goals: Progressing toward goals  Plan Discharge plan remains appropriate    Precautions / Restrictions Precautions Precautions: Fall Restrictions Weight Bearing Restrictions: No   Pertinent Vitals/Pain Denies pain    ADL  Eating/Feeding: Moderate assistance (Pt R hand dominant but w/dec coordination & dec sensation) Where Assessed - Eating/Feeding: Bed level Toilet Transfer: Minimal assistance;Moderate assistance (mod to R; min to  L) Toilet Transfer Method: Stand pivot Toilet Transfer Equipment: Bedside commode Transfers/Ambulation Related to ADLs: did not ambulate; side stepped along EOB w/min-mod A ADL Comments: dec fx'l use of dominant RUE/hand    OT Diagnosis:    OT Problem List:   OT Treatment Interventions:     OT Goals(current goals can now be found in the care plan section) Acute Rehab OT Goals Patient Stated Goal: regain fx'l (I) and go home w/wife OT Goal Formulation: With patient/family Time For Goal Achievement: 03/02/13 Potential to Achieve Goals: Good  Visit Information  Last OT Received On: 02/01/13 Assistance Needed: +2 History of Present Illness:  (79yo s/p heart cath w/postop CVA)    Subjective Data      Prior Functioning       Cognition  Cognition Arousal/Alertness: Awake/alert Behavior During Therapy: WFL for tasks assessed/performed Overall Cognitive Status: Within Functional Limits for tasks assessed (need to assess higher level cognition/executive functioning)    Mobility  Bed Mobility Bed Mobility: Sit to Supine;Supine to Sit;Sitting - Scoot to [place] of Bed;Rolling Left;Rolling Right Rolling Right: 5: Supervision Rolling Left: 4: Min assist Supine to Sit: 3: Mod assist Sitting - Scoot to Edge of Bed: 4: Min guard Sit to Supine: 4: Min guard Details for Bed Mobility Assistance: used rails for rolling; inc difficulty using RUE to grab L rail Transfers Transfers: Sit to Stand;Stand to Sit Sit to Stand: 4: Min assist;From bed;From chair/3-in-1 (cues/assist for RUE assist ) Stand to Sit: 4:  Min assist Details for Transfer Assistance: inc assist to R 2/2 dec coordination RLE/RUE; min A to L    Exercises      Balance Balance Balance Assessed: Yes Static Sitting Balance Static Sitting - Balance Support: No upper extremity supported Static Sitting - Level of Assistance: 5: Stand by assistance (close stand by assist; cues for midline) Static Sitting - Comment/# of Minutes:  5 Dynamic Sitting Balance Dynamic Sitting - Balance Support: Feet supported;During functional activity Dynamic Sitting - Level of Assistance: 4: Min assist Static Standing Balance Static Standing - Balance Support: Bilateral upper extremity supported Static Standing - Level of Assistance: 4: Min assist Static Standing - Comment/# of Minutes: 1   End of Session OT - End of Session Activity Tolerance: Patient tolerated treatment well Patient left: in bed;with family/visitor present (wife & dtr present)  GO     Chorosevic, Deidre Ala 02/01/2013, 1:36 PM

## 2013-02-01 NOTE — Progress Notes (Signed)
Rehab admissions - Evaluated for possible admission.  I met with patient, his wife and his daughter.  I gave them rehab booklets and discussed inpatient rehab.  All are agreeable to inpatient rehab admission.  Bed available and will admit to inpatient rehab today.  Call me for questions.  #[phone]

## 2013-02-01 NOTE — Evaluation (Signed)
Physical Therapy Evaluation Patient Details Name: [name] MRN: [id number] DOB: 1933-12-20 Today's Date: 02/01/2013 Time: 7829-5621 PT Time Calculation (min): 39 min  PT Assessment / Plan / Recommendation History of Present Illness  79yo s/p heart cath w/apparent CVA post op.  MRI revealed Multiple subcentimeter areas of restricted diffusion without hemorrhage representing acute infarction likely related to a shower of emboli.   Clinical Impression  Pt is highly motivated to get better and is moving better with each attempt on his feet.  He is able to walk with two person assist and should transition quickly to gait with one person assist with the right assistive device (for now I would recommend RW with R hand splint attachment).  He will be an excellent inpatient rehab candidate and will progress well with them.    PT Assessment  Patient needs continued PT services    Follow Up Recommendations  CIR    Does the patient have the potential to tolerate intense rehabilitation     Yes  Barriers to Discharge   None      Equipment Recommendations  Rolling walker with 5" wheels (with right hand splint)    Recommendations for Other Services   None  Frequency Min 4X/week    Precautions / Restrictions Precautions Precautions: Fall Precaution Comments: right sided weakness and decreased coordination   Pertinent Vitals/Pain See vitals flow sheet.      Mobility  Bed Mobility Bed Mobility: Supine to Sit;Sitting - Scoot to Edge of Bed;Sit to Supine Supine to Sit: 5: Supervision;With rails;HOB elevated Sitting - Scoot to Edge of Bed: 5: Supervision;With rail Sit to Supine: 5: Supervision;With rail;HOB elevated Details for Bed Mobility Assistance: supervision. Pt using railing and bed for assist, but able to get to EOB wihout external assist.   Transfers Transfers: Sit to Stand;Stand to Sit Sit to Stand: 1: +2 Total assist;With upper extremity assist;From bed Sit to Stand:  Patient Percentage: 80% Stand to Sit: 1: +2 Total assist;Without upper extremity assist;To bed Stand to Sit: Patient Percentage: 80% Details for Transfer Assistance: two person assist because this was going to be needed for safety with gait.  He could get to standing with OT earlier with just one person assist.  pt relying on stability of the bed for balance as well as both upper extremities.   Ambulation/Gait Ambulation/Gait Assistance: 1: +2 Total assist Ambulation/Gait: Patient Percentage: 80% Ambulation Distance (Feet): 30 Feet (15' x 2) Assistive device: 2 person hand held assist Ambulation/Gait Assistance Details: two person hand held assist for balance and stability during gait.  I did not try RW due to right upper extremity deficits and no platform readily available (really I would like a hand splint, not platform andyway).   Gait Pattern: Step-to pattern;Decreased weight shift to right;Ataxic;Narrow base of support Gait velocity: decreased Modified Rankin (Stroke Patients Only) Pre-Morbid Rankin Score: No symptoms Modified Rankin: Moderately severe disability        PT Diagnosis: Difficulty walking;Abnormality of gait;Hemiplegia dominant side  PT Problem List: Decreased strength;Decreased activity tolerance;Decreased balance;Decreased mobility;Decreased coordination;Decreased knowledge of use of DME;Impaired sensation PT Treatment Interventions: DME instruction;Gait training;Stair training;Functional mobility training;Therapeutic activities;Therapeutic exercise;Balance training;Neuromuscular re-education;Patient/family education;Wheelchair mobility training     PT Goals(Current goals can be found in the care plan section) Acute Rehab PT Goals Patient Stated Goal: to walk again and be as independent as possible. PT Goal Formulation: With patient/family Time For Goal Achievement: 02/15/13 Potential to Achieve Goals: Good  Visit Information  Last PT Received On:  02/01/13 Assistance Needed: +2 (for safety with gait) History of Present Illness: 79yo s/p heart cath w/apparent CVA post op.  MRI revealed Multiple subcentimeter areas of restricted diffusion without hemorrhage representing acute infarction likely related to a shower of emboli.        Prior Functioning  Home Living Family/patient expects to be discharged to:: Inpatient rehab Living Arrangements: Spouse/significant other Additional Comments: has great family support Prior Function Level of Independence: Independent Communication Communication: Other (comment) (dysarthric speech) Dominant Hand: Right    Cognition  Cognition Arousal/Alertness: Awake/alert Behavior During Therapy: WFL for tasks assessed/performed Overall Cognitive Status: Within Functional Limits for tasks assessed    Extremity/Trunk Assessment Upper Extremity Assessment Upper Extremity Assessment: Defer to OT evaluation Lower Extremity Assessment Lower Extremity Assessment: RLE deficits/detail RLE Deficits / Details: decreased functional strength in right leg in stanidng and supporting his body weight it feels weak and buckling.  With MMT 4+-5/5 RLE Sensation: decreased light touch RLE Coordination: decreased gross motor;decreased fine motor Cervical / Trunk Assessment Cervical / Trunk Assessment: Normal;Other exceptions Cervical / Trunk Exceptions: pt is very valgus at the knees- this is his baseline.     Balance Balance Balance Assessed: Yes Static Sitting Balance Static Sitting - Balance Support: Bilateral upper extremity supported;Feet supported Static Sitting - Level of Assistance: 5: Stand by assistance Static Sitting - Comment/# of Minutes: 5 min Dynamic Sitting Balance Dynamic Sitting - Balance Support: Feet supported;Bilateral upper extremity supported Dynamic Sitting - Level of Assistance: 1: +2 Total assist;Patient percentage (comment) (pt 80%) Static Standing Balance Static Standing - Balance  Support: Bilateral upper extremity supported Static Standing - Level of Assistance: 1: +2 Total assist;Patient percentage (comment) (pt 80%)  End of Session PT - End of Session Equipment Utilized During Treatment: Gait belt Activity Tolerance: Patient tolerated treatment well Patient left: in bed;with call bell/phone within reach;with nursing/sitter in room;with family/visitor present    Lurena Joiner B. Medendorp, PT, DPT [phone]   02/01/2013, 5:01 PM

## 2013-02-01 NOTE — Consult Note (Signed)
Physical Medicine and Rehabilitation Consult Reason for Consult: CVA Referring Physician: [facility]   HPI: [name] is a 79 y.o. right-handed male with history of CAD/MI with stenting her last cardiac catheterization 2004, hypertension and seizure disorder. Admitted 01/29/2013 with left-sided chest pain while raking leaves. He did receive nitroglycerin in the ED with some relief. EKG no significant ST segment changes. Troponin negative. Underwent cardiac catheterization 01/30/2013 per Dr.McLean that showed 40-50% proximal LAD stenosis and advised medical management. Noted post catheterization with right facial numbness as well as slurred speech.  MRI shows bilateral anterior posterior infarcts. MRA confirms findings left PCA occlusion. Echocardiogram 02/01/2003 Ladona Ridgel he 55% grade 1 diastolic dysfunction. Carotid Dopplers with left 60-79% ICA stenosis. Patient did not receive TPA. Maintained on Plavix therapy for CVA prophylaxis as well as cardiac history as well as the addition of subcutaneous heparin for DVT prophylaxis. Occupational therapy evaluation completed 01/31/2013 recommendations of physical medicine rehabilitation consult. Review of Systems  Cardiovascular: Positive for chest pain.  Musculoskeletal: Positive for myalgias.  Neurological: Positive for dizziness and seizures.  All other systems reviewed and are negative.   Past Medical History  Diagnosis Date  . Coronary artery disease     s/p MI tx with Cypher DES to pRCA in 4/04; LHC 4/04 in the setting of his MI: Proximal LAD 30%, ostial diagonal 40-50%, ostial OM1 30%, proximal RCA 95% which was the infarct vessel   ;    Echocardiogram 7/09: Normal LV function.  Nuclear study  March 2013 negative for ischemia  . Dyslipidemia   . Seizure disorder   . Hypothyroidism   . DDD (degenerative disc disease)   . Hx MRSA infection     left buttocks abscess  . Rotator cuff injury     chronic rotator cuff injury status post repair  .  Hypertension   . Syncope   . Dizziness   . Meniere's disease     Status post shunt  . BPH (benign prostatic hyperplasia)   . Stenosis of left internal carotid artery     40-59% on carotid doppler 2014  . Diabetes mellitus without complication   . Hyperlipidemia    Past Surgical History  Procedure Laterality Date  . Coronary angioplasty with stent placement  07/14/2002    Stent to the right coronary artery  . Rotator cuff repair      for chronic rotator cuff injury  . Hernia repair  04/10/2008    scrotal hernia repair  . Endolymphatic shunt decompression  06/11/2009     Right endolymphatic sac decompression and shunt  placement   Family History  Problem Relation Age of Onset  . Heart attack Father 66  . Aneurysm Mother     brain aneurysm  . Coronary artery disease Brother     with CABG   Social History:  reports that he quit smoking about 51 years ago. He does not have any smokeless tobacco history on file. He reports that he does not drink alcohol or use illicit drugs. Allergies: No Known Allergies Medications Prior to Admission  Medication Sig Dispense Refill  . atorvastatin (LIPITOR) 40 MG tablet Take 2 tablets (80 mg total) by mouth daily.  60 tablet  5  . carbamazepine (CARBATROL) 300 MG 12 hr capsule Take 300 mg by mouth 2 (two) times daily.      . cloNIDine (CATAPRES) 0.2 MG tablet Take 0.2 mg by mouth daily.      . clopidogrel (PLAVIX) 75 MG tablet Take 1 tablet (  75 mg total) by mouth daily.  30 tablet  5  . diazepam (VALIUM) 2 MG tablet Take 2 mg by mouth 2 (two) times daily.      . fluticasone (FLONASE) 50 MCG/ACT nasal spray Place 1 spray into the nose daily as needed for allergies.       . hydrochlorothiazide (HYDRODIURIL) 25 MG tablet Take 1 tablet (25 mg total) by mouth daily.  30 tablet  5  . levothyroxine (SYNTHROID, LEVOTHROID) 25 MCG tablet Take 50 mcg by mouth daily before breakfast.      . losartan (COZAAR) 50 MG tablet Take 1 tablet (50 mg total) by mouth  2 (two) times daily.  60 tablet  11  . metFORMIN (GLUCOPHAGE) 500 MG tablet Take 500 mg by mouth 2 (two) times daily with a meal.      . metoprolol (LOPRESSOR) 50 MG tablet Take 50 mg by mouth 2 (two) times daily.      . Multiple Vitamins-Minerals (MULTIVITAMINS THER. W/MINERALS) TABS Take 1 tablet by mouth at bedtime.        . nitroGLYCERIN (NITROSTAT) 0.4 MG SL tablet Place 1 tablet (0.4 mg total) under the tongue every 5 (five) minutes as needed for chest pain.  25 tablet  3  . silodosin (RAPAFLO) 8 MG CAPS capsule Take 8 mg by mouth daily with breakfast.         Home: Home Living Family/patient expects to be discharged to:: Inpatient rehab Living Arrangements: Spouse/significant other  Functional History:   Functional Status:  Mobility:          ADL:    Cognition: Cognition Overall Cognitive Status: Within Functional Limits for tasks assessed Orientation Level: Oriented X4 Cognition Arousal/Alertness: Awake/alert Behavior During Therapy: WFL for tasks assessed/performed Overall Cognitive Status: Within Functional Limits for tasks assessed  Blood pressure 160/72, pulse 82, temperature 97.6 F (36.4 C), temperature source Oral, resp. rate 18, height 5\' 7"  (1.702 m), weight 100.789 kg (222 lb 3.2 oz), SpO2 93.00%. Physical Exam  Vitals reviewed. Constitutional: He is oriented to person, place, and time. He appears well-developed and well-nourished.  HENT:  Head: Normocephalic and atraumatic.  Right Ear: External ear normal.  Left Ear: External ear normal.  Eyes: EOM are normal. Right eye exhibits no discharge. Left eye exhibits no discharge.  Neck: Normal range of motion. Neck supple. No JVD present. No tracheal deviation present. No thyromegaly present.  Cardiovascular: Normal rate and regular rhythm.   Respiratory: Effort normal and breath sounds normal. No respiratory distress.  GI: Soft. Bowel sounds are normal. He exhibits no distension. There is no tenderness.  There is no rebound.  Musculoskeletal: He exhibits no edema and no tenderness.  Lymphadenopathy:    He has no cervical adenopathy.  Neurological: He is alert and oriented to person, place, and time.  Mild right facial weakness. Decreased sensation around the mouth and tongue. RUE 4/5. RLE 4 to 4+/5. Right upper and lower limb ataxia. Speech slurred. With extra time and good phonation he was intelligible.  Reasonable insight and awareness. ?mild processing delays. STM functional.   Skin: Skin is warm and dry.  Psychiatric: He has a normal mood and affect. His behavior is normal. Judgment and thought content normal.    Results for orders placed during the hospital encounter of 01/29/13 (from the past 24 hour(s))  GLUCOSE, CAPILLARY     Status: Abnormal   Collection Time    01/31/13 11:49 AM      Result Value Range  Glucose-Capillary 145 (*) 70 - 99 mg/dL  TROPONIN I     Status: None   Collection Time    01/31/13 11:54 AM      Result Value Range   Troponin I <0.30  <0.30 ng/mL  GLUCOSE, CAPILLARY     Status: Abnormal   Collection Time    01/31/13  4:44 PM      Result Value Range   Glucose-Capillary 129 (*) 70 - 99 mg/dL  TROPONIN I     Status: None   Collection Time    01/31/13  7:10 PM      Result Value Range   Troponin I <0.30  <0.30 ng/mL  GLUCOSE, CAPILLARY     Status: Abnormal   Collection Time    01/31/13  8:45 PM      Result Value Range   Glucose-Capillary 141 (*) 70 - 99 mg/dL   Comment 1 Notify RN    TROPONIN I     Status: None   Collection Time    02/01/13 12:12 AM      Result Value Range   Troponin I <0.30  <0.30 ng/mL  GLUCOSE, CAPILLARY     Status: Abnormal   Collection Time    02/01/13  8:08 AM      Result Value Range   Glucose-Capillary 142 (*) 70 - 99 mg/dL   Comment 1 Documented in Chart     Comment 2 Notify RN     Mr Shirlee Latch Wo Contrast  01/31/2013   CLINICAL DATA:  Multiple infarctions  EXAM: MRA HEAD WITHOUT CONTRAST  TECHNIQUE: Angiographic  images of the Circle of Willis were obtained using MRA technique without intravenous contrast.  COMPARISON:  01/30/2013  FINDINGS: Both internal carotid arteries are widely patent into the brain. No siphon stenosis. The anterior and middle cerebral vessels are patent without proximal stenosis, aneurysm or vascular malformation.  Both vertebral arteries are patent with the right being larger than the left. There is some atherosclerotic irregularity, particularly of the right vertebral artery. No flow-limiting stenosis.  The basilar artery shows slight atherosclerotic irregularity but no stenosis.  The left posterior cerebral artery is occluded 1.5 cm beyond its origin. There is severe stenosis of the right PCA 1 cm beyond its origin but the vessel does show persistent flow distally.  IMPRESSION: Occlusion of the left posterior cerebral artery 1.5 cm beyond its origin. Severe stenosis of the right PCA 1 cm beyond its origin. Atherosclerotic irregularity of the dominant right vertebral artery without of visible flow-limiting stenosis .   Electronically Signed   By: Paulina Fusi M.D.   On: 01/31/2013 09:04   [name]  01/30/2013   CLINICAL DATA:  Perioral numbness following cardiac catheterization. Slurred speech.  EXAM: MRI HEAD WITHOUT CONTRAST  TECHNIQUE: Multiplanar, multiecho pulse sequences of the brain and surrounding structures were obtained without intravenous contrast.  COMPARISON:  12/04/2012 MRI from [facility].  FINDINGS: Multiple subcentimeter areas of restricted diffusion representing acute infarction are seen throughout both hemispheres, most notably the left thalamus, without hemorrhage. These are new from the prior MRI in September and represent what are likely multiple emboli.  No mass lesion, hydrocephalus, or extra-axial fluid.  Moderate cerebral and cerebellar atrophy. Mild subcortical and periventricular T2 and FLAIR hyperintensities, likely chronic microvascular ischemic  change. Flow voids are maintained throughout the carotid, basilar, and vertebral arteries. There are no areas of chronic hemorrhage. Pituitary, pineal, and cerebellar tonsils unremarkable. No upper cervical lesions. Visualized calvarium,  skull base, and upper cervical osseous structures unremarkable. Scalp, orbits, sinuses, and mastoids show no acute process.  Incidental note is made of a superficial lobe left parotid ovoid lesion 5 x 10 mm cross-section probably a lymph node, stable from September.  IMPRESSION: Multiple subcentimeter areas of restricted diffusion without hemorrhage representing acute infarction likely related to a shower of emboli.  These results will be called to the ordering clinician or representative by the Radiologist Assistant, and communication documented in the PACS Dashboard.   Electronically Signed   By: Davonna Belling M.D.   On: 01/30/2013 19:04    Assessment/Plan: Diagnosis: embolic infarct in the left PCA distribution, most prominently involving the left thalamus 1. Does the need for close, 24 hr/day medical supervision in concert with the patient's rehab needs make it unreasonable for this patient to be served in a less intensive setting? Yes 2. Co-Morbidities requiring supervision/potential complications: htn, cad, obesity 3. Due to bladder management, bowel management, safety, skin/wound care, disease management, medication administration, pain management and patient education, does the patient require 24 hr/day rehab nursing? Yes 4. Does the patient require coordinated care of a physician, rehab nurse, PT (1-2 hrs/day, 5 days/week), OT (1-2 hrs/day, 5 days/week) and SLP (1-2 hrs/day, 5 days/week) to address physical and functional deficits in the context of the above medical diagnosis(es)? Yes Addressing deficits in the following areas: balance, endurance, locomotion, strength, transferring, bowel/bladder control, bathing, dressing, feeding, grooming, toileting and  cognition, speech 5. Can the patient actively participate in an intensive therapy program of at least 3 hrs of therapy per day at least 5 days per week? Yes 6. The potential for patient to make measurable gains while on inpatient rehab is excellent 7. Anticipated functional outcomes upon discharge from inpatient rehab are supervision to mod I with PT, supervision with OT, supervision to mod I with SLP. 8. Estimated rehab length of stay to reach the above functional goals is: 7-13 days 9. Does the patient have adequate social supports to accommodate these discharge functional goals? Yes 10. Anticipated D/C setting: Home 11. Anticipated post D/C treatments: HH therapy 12. Overall Rehab/Functional Prognosis: excellent  RECOMMENDATIONS: This patient's condition is appropriate for continued rehabilitative care in the following setting: CIR Patient has agreed to participate in recommended program. Yes Note that insurance prior authorization may be required for reimbursement for recommended care.  Comment: Rehab RN to follow up.   Ranelle Oyster, MD, [place] [facility]     02/01/2013

## 2013-02-01 NOTE — H&P (Signed)
Physical Medicine and Rehabilitation Admission H&P    Chief Complaint  Patient presents with  . Chest Pain   : Chief complaint: Right side weakness  HPI: [name] is a 79 y.o. right-handed male with history of CAD/MI with stenting her last cardiac catheterization 2004, hypertension and seizure disorder. Admitted 01/29/2013 with left-sided chest pain while raking leaves. He did receive nitroglycerin in the ED with some relief. EKG no significant ST segment changes.  Troponin negative. Underwent cardiac catheterization 01/30/2013 per Dr.McLean that showed 40-50% proximal LAD stenosis and advised medical management. Noted post catheterization with right facial numbness as well as slurred speech.  MRI shows bilateral anterior posterior infarcts. MRA confirms findings left PCA occlusion. Echocardiogram 02/01/2003 Ladona Ridgel he 55% grade 1 diastolic dysfunction. Carotid Dopplers with left 60-79% ICA stenosis. Patient did not receive TPA. Maintained on Plavix therapy for CVA prophylaxis as well as cardiac history as well as the addition of subcutaneous heparin for DVT prophylaxis. Occupational therapy evaluation completed 01/31/2013 recommendations of physical medicine rehabilitation consult. Patient was felt to be a good candidate for inpatient rehabilitation services and was admitted for a comprehensive rehabilitation program after consultation of the rehab team.    ROS Review of Systems  Cardiovascular: Positive for chest pain.  Musculoskeletal: Positive for myalgias.  Neurological: Positive for dizziness and seizures.  Genitourinary: Urgency All other systems reviewed and are negative  Past Medical History  Diagnosis Date  . Coronary artery disease     s/p MI tx with Cypher DES to pRCA in 4/04; LHC 4/04 in the setting of his MI: Proximal LAD 30%, ostial diagonal 40-50%, ostial OM1 30%, proximal RCA 95% which was the infarct vessel   ;    Echocardiogram 7/09: Normal LV function.  Nuclear  study  March 2013 negative for ischemia  . Dyslipidemia   . Seizure disorder   . Hypothyroidism   . DDD (degenerative disc disease)   . Hx MRSA infection     left buttocks abscess  . Rotator cuff injury     chronic rotator cuff injury status post repair  . Hypertension   . Syncope   . Dizziness   . Meniere's disease     Status post shunt  . BPH (benign prostatic hyperplasia)   . Stenosis of left internal carotid artery     40-59% on carotid doppler 2014  . Diabetes mellitus without complication   . Hyperlipidemia    Past Surgical History  Procedure Laterality Date  . Coronary angioplasty with stent placement  07/14/2002    Stent to the right coronary artery  . Rotator cuff repair      for chronic rotator cuff injury  . Hernia repair  04/10/2008    scrotal hernia repair  . Endolymphatic shunt decompression  06/11/2009     Right endolymphatic sac decompression and shunt  placement   Family History  Problem Relation Age of Onset  . Heart attack Father 42  . Aneurysm Mother     brain aneurysm  . Coronary artery disease Brother     with CABG   Social History:  reports that he quit smoking about 51 years ago. He does not have any smokeless tobacco history on file. He reports that he does not drink alcohol or use illicit drugs. Allergies: No Known Allergies Medications Prior to Admission  Medication Sig Dispense Refill  . atorvastatin (LIPITOR) 40 MG tablet Take 2 tablets (80 mg total) by mouth daily.  60 tablet  5  . carbamazepine (  CARBATROL) 300 MG 12 hr capsule Take 300 mg by mouth 2 (two) times daily.      . cloNIDine (CATAPRES) 0.2 MG tablet Take 0.2 mg by mouth daily.      . clopidogrel (PLAVIX) 75 MG tablet Take 1 tablet (75 mg total) by mouth daily.  30 tablet  5  . diazepam (VALIUM) 2 MG tablet Take 2 mg by mouth 2 (two) times daily.      . fluticasone (FLONASE) 50 MCG/ACT nasal spray Place 1 spray into the nose daily as needed for allergies.       .  hydrochlorothiazide (HYDRODIURIL) 25 MG tablet Take 1 tablet (25 mg total) by mouth daily.  30 tablet  5  . levothyroxine (SYNTHROID, LEVOTHROID) 25 MCG tablet Take 50 mcg by mouth daily before breakfast.      . losartan (COZAAR) 50 MG tablet Take 1 tablet (50 mg total) by mouth 2 (two) times daily.  60 tablet  11  . metFORMIN (GLUCOPHAGE) 500 MG tablet Take 500 mg by mouth 2 (two) times daily with a meal.      . metoprolol (LOPRESSOR) 50 MG tablet Take 50 mg by mouth 2 (two) times daily.      . Multiple Vitamins-Minerals (MULTIVITAMINS THER. W/MINERALS) TABS Take 1 tablet by mouth at bedtime.        . nitroGLYCERIN (NITROSTAT) 0.4 MG SL tablet Place 1 tablet (0.4 mg total) under the tongue every 5 (five) minutes as needed for chest pain.  25 tablet  3  . silodosin (RAPAFLO) 8 MG CAPS capsule Take 8 mg by mouth daily with breakfast.         Home: Home Living Family/patient expects to be discharged to:: Inpatient rehab Living Arrangements: Spouse/significant other   Functional History:    Functional Status:  Mobility:          ADL:    Cognition: Cognition Overall Cognitive Status: Within Functional Limits for tasks assessed Orientation Level: Oriented X4 Cognition Arousal/Alertness: Awake/alert Behavior During Therapy: WFL for tasks assessed/performed Overall Cognitive Status: Within Functional Limits for tasks assessed  Physical Exam: Blood pressure 113/62, pulse 64, temperature 97.1 F (36.2 C), temperature source Oral, resp. rate 18, height 5\' 7"  (1.702 m), weight 100.789 kg (222 lb 3.2 oz), SpO2 97.00%. Physical Exam Constitutional: He is oriented to person, place, and time. He appears well-developed and well-nourished.  HENT:  Head: Normocephalic and atraumatic.  Right Ear: External ear normal.  Left Ear: External ear normal.  Eyes: EOM are normal. Right eye exhibits no discharge. Left eye exhibits no discharge.  Neck: Normal range of motion. Neck supple. No JVD  present. No tracheal deviation present. No thyromegaly present.  Cardiovascular: Normal rate and regular rhythm.  Respiratory: Effort normal and breath sounds normal. No respiratory distress.  GI: Soft. Bowel sounds are normal. He exhibits no distension. There is no tenderness. There is no rebound.  Musculoskeletal: He exhibits no edema and no tenderness.  Lymphadenopathy:  He has no cervical adenopathy.  Neurological: He is alert and oriented to person, place, and time.  Mild right facial weakness. Decreased sensation around the mouth and tongue. RUE 4/5. RLE 4 to 4+/5. Right upper and lower limb ataxia. Speech slurred. With extra time and good phonation he was intelligible. Reasonable insight and awareness. ?mild processing delays. STM functional.  Skin: Skin is warm and dry.  Psychiatric: He has a normal mood and affect. His behavior is normal. Judgment and thought content normal  Results for orders  placed during the hospital encounter of 01/29/13 (from the past 48 hour(s))  GLUCOSE, CAPILLARY     Status: Abnormal   Collection Time    01/30/13  4:31 PM      Result Value Range   Glucose-Capillary 129 (*) 70 - 99 mg/dL  TROPONIN I     Status: None   Collection Time    01/30/13  4:45 PM      Result Value Range   Troponin I <0.30  <0.30 ng/mL   Comment:            Due to the release kinetics of cTnI,     a negative result within the first hours     of the onset of symptoms does not rule out     myocardial infarction with certainty.     If myocardial infarction is still suspected,     repeat the test at appropriate intervals.  GLUCOSE, CAPILLARY     Status: Abnormal   Collection Time    01/30/13  9:35 PM      Result Value Range   Glucose-Capillary 166 (*) 70 - 99 mg/dL   Comment 1 Notify RN    TROPONIN I     Status: None   Collection Time    01/30/13 11:15 PM      Result Value Range   Troponin I <0.30  <0.30 ng/mL   Comment:            Due to the release kinetics of cTnI,      a negative result within the first hours     of the onset of symptoms does not rule out     myocardial infarction with certainty.     If myocardial infarction is still suspected,     repeat the test at appropriate intervals.  HEMOGLOBIN A1C     Status: Abnormal   Collection Time    01/30/13 11:15 PM      Result Value Range   Hemoglobin A1C 6.5 (*) <5.7 %   Comment: (NOTE)                                                                               According to the ADA Clinical Practice Recommendations for 2011, when     HbA1c is used as a screening test:      >=6.5%   Diagnostic of Diabetes Mellitus               (if abnormal result is confirmed)     5.7-6.4%   Increased risk of developing Diabetes Mellitus     References:Diagnosis and Classification of Diabetes Mellitus,Diabetes     Care,2011,34(Suppl 1):S62-S69 and Standards of Medical Care in             Diabetes - 2011,Diabetes Care,2011,34 (Suppl 1):S11-S61.   Mean Plasma Glucose 140 (*) <117 mg/dL   Comment: Performed at [facility]  LIPID PANEL     Status: None   Collection Time    01/30/13 11:15 PM      Result Value Range   Cholesterol 147  0 - 200 mg/dL   Triglycerides 86  <161 mg/dL   HDL 54  >09  mg/dL   Total CHOL/HDL Ratio 2.7     VLDL 17  0 - 40 mg/dL   LDL Cholesterol 76  0 - 99 mg/dL   Comment:            Total Cholesterol/HDL:CHD Risk     Coronary Heart Disease Risk Table                         Men   Women      1/2 Average Risk   3.4   3.3      Average Risk       5.0   4.4      2 X Average Risk   9.6   7.1      3 X Average Risk  23.4   11.0                Use the calculated Patient Ratio     above and the CHD Risk Table     to determine the patient's CHD Risk.                ATP III CLASSIFICATION (LDL):      <100     mg/dL   Optimal      161-096  mg/dL   Near or Above                        Optimal      130-159  mg/dL   Borderline      045-409  mg/dL   High      >811     mg/dL   Very High   TROPONIN I     Status: None   Collection Time    01/31/13  4:49 AM      Result Value Range   Troponin I <0.30  <0.30 ng/mL   Comment:            Due to the release kinetics of cTnI,     a negative result within the first hours     of the onset of symptoms does not rule out     myocardial infarction with certainty.     If myocardial infarction is still suspected,     repeat the test at appropriate intervals.  GLUCOSE, CAPILLARY     Status: Abnormal   Collection Time    01/31/13  9:07 AM      Result Value Range   Glucose-Capillary 124 (*) 70 - 99 mg/dL  GLUCOSE, CAPILLARY     Status: Abnormal   Collection Time    01/31/13 11:49 AM      Result Value Range   Glucose-Capillary 145 (*) 70 - 99 mg/dL  TROPONIN I     Status: None   Collection Time    01/31/13 11:54 AM      Result Value Range   Troponin I <0.30  <0.30 ng/mL   Comment:            Due to the release kinetics of cTnI,     a negative result within the first hours     of the onset of symptoms does not rule out     myocardial infarction with certainty.     If myocardial infarction is still suspected,     repeat the test at appropriate intervals.  GLUCOSE, CAPILLARY     Status: Abnormal   Collection Time    01/31/13  4:44 PM  Result Value Range   Glucose-Capillary 129 (*) 70 - 99 mg/dL  TROPONIN I     Status: None   Collection Time    01/31/13  7:10 PM      Result Value Range   Troponin I <0.30  <0.30 ng/mL   Comment:            Due to the release kinetics of cTnI,     a negative result within the first hours     of the onset of symptoms does not rule out     myocardial infarction with certainty.     If myocardial infarction is still suspected,     repeat the test at appropriate intervals.  GLUCOSE, CAPILLARY     Status: Abnormal   Collection Time    01/31/13  8:45 PM      Result Value Range   Glucose-Capillary 141 (*) 70 - 99 mg/dL   Comment 1 Notify RN    TROPONIN I     Status: None   Collection Time     02/01/13 12:12 AM      Result Value Range   Troponin I <0.30  <0.30 ng/mL   Comment:            Due to the release kinetics of cTnI,     a negative result within the first hours     of the onset of symptoms does not rule out     myocardial infarction with certainty.     If myocardial infarction is still suspected,     repeat the test at appropriate intervals.  GLUCOSE, CAPILLARY     Status: Abnormal   Collection Time    02/01/13  8:08 AM      Result Value Range   Glucose-Capillary 142 (*) 70 - 99 mg/dL   Comment 1 Documented in Chart     Comment 2 Notify RN    GLUCOSE, CAPILLARY     Status: Abnormal   Collection Time    02/01/13 11:45 AM      Result Value Range   Glucose-Capillary 124 (*) 70 - 99 mg/dL   Comment 1 Documented in Chart     Comment 2 Notify RN     Mr Maxine Glenn Head Wo Contrast  01/31/2013   CLINICAL DATA:  Multiple infarctions  EXAM: MRA HEAD WITHOUT CONTRAST  TECHNIQUE: Angiographic images of the Circle of Willis were obtained using MRA technique without intravenous contrast.  COMPARISON:  01/30/2013  FINDINGS: Both internal carotid arteries are widely patent into the brain. No siphon stenosis. The anterior and middle cerebral vessels are patent without proximal stenosis, aneurysm or vascular malformation.  Both vertebral arteries are patent with the right being larger than the left. There is some atherosclerotic irregularity, particularly of the right vertebral artery. No flow-limiting stenosis.  The basilar artery shows slight atherosclerotic irregularity but no stenosis.  The left posterior cerebral artery is occluded 1.5 cm beyond its origin. There is severe stenosis of the right PCA 1 cm beyond its origin but the vessel does show persistent flow distally.  IMPRESSION: Occlusion of the left posterior cerebral artery 1.5 cm beyond its origin. Severe stenosis of the right PCA 1 cm beyond its origin. Atherosclerotic irregularity of the dominant right vertebral artery without  of visible flow-limiting stenosis .   Electronically Signed   By: Paulina Fusi M.D.   On: 01/31/2013 09:04   [name]  01/30/2013   CLINICAL DATA:  Perioral numbness following  cardiac catheterization. Slurred speech.  EXAM: MRI HEAD WITHOUT CONTRAST  TECHNIQUE: Multiplanar, multiecho pulse sequences of the brain and surrounding structures were obtained without intravenous contrast.  COMPARISON:  12/04/2012 MRI from [facility]  FINDINGS: Multiple subcentimeter areas of restricted diffusion representing acute infarction are seen throughout both hemispheres, most notably the left thalamus, without hemorrhage. These are new from the prior MRI in September and represent what are likely multiple emboli.  No mass lesion, hydrocephalus, or extra-axial fluid.  Moderate cerebral and cerebellar atrophy. Mild subcortical and periventricular T2 and FLAIR hyperintensities, likely chronic microvascular ischemic change. Flow voids are maintained throughout the carotid, basilar, and vertebral arteries. There are no areas of chronic hemorrhage. Pituitary, pineal, and cerebellar tonsils unremarkable. No upper cervical lesions. Visualized calvarium, skull base, and upper cervical osseous structures unremarkable. Scalp, orbits, sinuses, and mastoids show no acute process.  Incidental note is made of a superficial lobe left parotid ovoid lesion 5 x 10 mm cross-section probably a lymph node, stable from September.  IMPRESSION: Multiple subcentimeter areas of restricted diffusion without hemorrhage representing acute infarction likely related to a shower of emboli.  These results will be called to the ordering clinician or representative by the Radiologist Assistant, and communication documented in the PACS Dashboard.   Electronically Signed   By: Davonna Belling M.D.   On: 01/30/2013 19:04    Post Admission Physician Evaluation: 1. Functional deficits secondary  to embolic, left pca/thalamic  infarct. 2. Patient is admitted to receive collaborative, interdisciplinary care between the physiatrist, rehab nursing staff, and therapy team. 3. Patient's level of medical complexity and substantial therapy needs in context of that medical necessity cannot be provided at a lesser intensity of care such as a SNF. 4. Patient has experienced substantial functional loss from his/her baseline which was documented above under the "Functional History" and "Functional Status" headings.  Judging by the patient's diagnosis, physical exam, and functional history, the patient has potential for functional progress which will result in measurable gains while on inpatient rehab.  These gains will be of substantial and practical use upon discharge  in facilitating mobility and self-care at the household level. 5. Physiatrist will provide 24 hour management of medical needs as well as oversight of the therapy plan/treatment and provide guidance as appropriate regarding the interaction of the two. 6. 24 hour rehab nursing will assist with bladder management, bowel management, safety, skin/wound care, disease management, medication administration, pain management and patient education  and help integrate therapy concepts, techniques,education, etc. 7. PT will assess and treat for/with: Lower extremity strength, range of motion, stamina, balance, functional mobility, safety, adaptive techniques and equipment, vestibular rx, education.   Goals are: mod I to supervision. 8. OT will assess and treat for/with: ADL's, functional mobility, safety, upper extremity strength, adaptive techniques and equipment, NMR, vestibular rx, education.   Goals are: supervision to mod I. 9. SLP will assess and treat for/with: speech, communication, cognition.  Goals are: mod i to supervision. 10. Case Management and Social Worker will assess and treat for psychological issues and discharge planning. 11. Team conference will be held weekly to  assess progress toward goals and to determine barriers to discharge. 12. Patient will receive at least 3 hours of therapy per day at least 5 days per week. 13. ELOS: 8-13 days       14. Prognosis:  excellent   Medical Problem List and Plan: 1. Embolic infarcts left PCA distribution, most prominently involving left thalamus after cardiac catheterization 2.  DVT Prophylaxis/Anticoagulation: Subcutaneous heparin. Monitor platelet counts and any signs of bleeding 3. Pain Management: Tylenol as needed. Monitor with increased mobility 4. Neuropsych: This patient is capable of making decisions on his own behalf. 5. CAD status post catheterization 01/30/2013. Followup cardiac services. Continue medical management with Plavix therapy 6. Seizure disorder. Tegretol 300 mg twice a day, Valium 2 mg twice a day. Monitor for any signs of seizure 7. Hypertension. Lopressor 50 mg twice a day, Cozaar 50 mg twice a day, Imdur 30 mg daily, hydrochlorothiazide 25 mg daily, clonidine 0.2 mg daily. Monitor with increased mobility 8. Diabetes mellitus with peripheral neuropathy. Hemoglobin A1c 6.5. Sliding scale insulin her present. Check CBGs a.c. and at bedtime. Patient on Glucophage 500 mg twice a day prior to admission and resume as tolerated. 9. Hypothyroidism. Synthroid 10. BPH. Flomax. Check PVRs x3. 11. Hyperlipidemia.  Lipitor  Ranelle Oyster, MD, [facility] Physical Medicine & Rehabilitation  02/01/2013

## 2013-02-01 NOTE — Progress Notes (Signed)
Notified Dr. Amada Jupiter of pt increase in blood pressure and pt more weaker on right side, advise to keep flat and continue to monitor.

## 2013-02-01 NOTE — PMR Pre-admission (Signed)
PMR Admission Coordinator Pre-Admission Assessment  Patient: [name] is an 79 y.o., male MRN: [id number] DOB: 1933/07/14 Height: 5\' 7"  (170.2 cm) Weight: 100.789 kg (222 lb 3.2 oz)              Insurance Information HMO:      PPO:       PCP:       IPA:       80/20:       OTHER:   PRIMARY: Medicare A/B      Policy#: [id number]      Subscriber: Rosaura Carpenter CM Name:        Phone#:       Fax#:   Pre-Cert#:        Employer: Retired Benefits:  Phone #:       Name: Armed forces technical officer. Date: 06/20/00     Deduct: $1216      Out of Pocket Max: none      Life Max: unlimited CIR: 100%      SNF: 100 Days Outpatient: 80%     Co-Pay: 20% Home Health: 100%      Co-Pay: none DME: 80%     Co-Pay: 20% Providers: patient's choice  SECONDARY: [employer]      Policy#: [id number]      Subscriber: Rosaura Carpenter CM Name:        Phone#:       Fax#:   Pre-Cert#:        Employer: Retired Benefits:  Phone #: [phone]     Name:   Eff. 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 right-handed male with history of CAD/MI with stenting his last cardiac catheterization 2004, hypertension and seizure disorder. Admitted 01/29/2013 with left-sided chest pain while raking leaves. He did receive nitroglycerin in the ED with some relief. EKG no significant ST segment changes.  Troponin negative. Underwent cardiac catheterization 01/30/2013 per Dr.McLean that showed 40-50% proximal LAD stenosis and advised medical management. Noted post catheterization with right facial numbness as well  as slurred speech.  MRI shows bilateral anterior posterior infarcts. MRA confirms findings left PCA occlusion. Echocardiogram 02/01/2003 Ladona Ridgel he 55% grade 1 diastolic dysfunction. Carotid Dopplers with left 60-79% ICA stenosis. Patient did not receive TPA. Maintained on Plavix therapy for CVA prophylaxis as well as cardiac history as well as the addition of subcutaneous heparin for DVT prophylaxis. Occupational therapy evaluation completed 01/31/2013 recommendations of physical medicine rehabilitation consult.    Total: 4=NIH  Past Medical History  Past Medical History  Diagnosis Date  . Coronary artery disease     s/p MI tx with Cypher DES to pRCA in 4/04; LHC 4/04 in the setting of his MI: Proximal LAD 30%, ostial diagonal 40-50%, ostial OM1 30%, proximal RCA 95% which was the infarct vessel   ;  Echocardiogram 7/09: Normal LV function.  Nuclear study  March 2013 negative for ischemia  . Dyslipidemia   . Seizure disorder   . Hypothyroidism   . DDD (degenerative disc disease)   . Hx MRSA infection     left buttocks abscess  . Rotator cuff injury     chronic rotator cuff injury status post repair  . Hypertension   . Syncope   . Dizziness   . Meniere's disease     Status post shunt  . BPH (benign prostatic hyperplasia)   . Stenosis of left internal carotid artery     40-59% on carotid doppler 2014  . Diabetes mellitus without complication   . Hyperlipidemia     Family History  family history includes Aneurysm in his mother; Coronary artery disease in his brother; Heart attack (age of onset: 66) in his father.  Prior Rehab/Hospitalizations:  Had cardiac rehab in 2002 after MI   Current Medications  Current facility-administered medications:0.9 %  sodium chloride infusion, , Intravenous, Continuous, Ripudeep K Rai, MD;  acetaminophen (TYLENOL) tablet 650 mg, 650 mg, Oral, Q4H PRN, Hillary Bow, DO;  atorvastatin (LIPITOR) tablet 80 mg, 80 mg, Oral, Daily, Jared M Gardner, DO,  80 mg at 02/01/13 1007;  bisacodyl (DULCOLAX) suppository 10 mg, 10 mg, Rectal, Daily PRN, Elease Etienne, MD carbamazepine (TEGRETOL XR) 12 hr tablet 300 mg, 300 mg, Oral, BID, Jared M Gardner, DO, 300 mg at 02/01/13 1007;  cloNIDine (CATAPRES) tablet 0.2 mg, 0.2 mg, Oral, Daily, Jared M Gardner, DO, 0.2 mg at 02/01/13 1007;  clopidogrel (PLAVIX) tablet 75 mg, 75 mg, Oral, Daily, Jared M Gardner, DO, 75 mg at 02/01/13 1007;  diazepam (VALIUM) tablet 2 mg, 2 mg, Oral, BID, Jared M Gardner, DO, 2 mg at 02/01/13 1007 fluticasone (FLONASE) 50 MCG/ACT nasal spray 1 spray, 1 spray, Each Nare, Daily PRN, Hillary Bow, DO;  heparin injection 5,000 Units, 5,000 Units, Subcutaneous, Q8H, Jared M Gardner, DO, 5,000 Units at 02/01/13 1410;  hydrochlorothiazide (HYDRODIURIL) tablet 25 mg, 25 mg, Oral, Daily, Jared M Gardner, DO, 25 mg at 02/01/13 1009;  insulin aspart (novoLOG) injection 0-5 Units, 0-5 Units, Subcutaneous, QHS, Elease Etienne, MD insulin aspart (novoLOG) injection 0-9 Units, 0-9 Units, Subcutaneous, TID WC, Elease Etienne, MD;  isosorbide mononitrate (IMDUR) 24 hr tablet 30 mg, 30 mg, Oral, Daily, Laurey Morale, MD, 30 mg at 02/01/13 1007;  levothyroxine (SYNTHROID, LEVOTHROID) tablet 50 mcg, 50 mcg, Oral, QAC breakfast, Hillary Bow, DO, 50 mcg at 02/01/13 1008;  losartan (COZAAR) tablet 50 mg, 50 mg, Oral, BID, Jared M Gardner, DO, 50 mg at 02/01/13 1007 metoprolol tartrate (LOPRESSOR) tablet 50 mg, 50 mg, Oral, BID, Jared M Gardner, DO, 50 mg at 02/01/13 1009;  nitroGLYCERIN (NITROSTAT) SL tablet 0.4 mg, 0.4 mg, Sublingual, Q5 min PRN, Bethann Berkshire, MD, 0.4 mg at 01/29/13 2209;  nitroGLYCERIN (NITROSTAT) SL tablet 0.4 mg, 0.4 mg, Sublingual, Q5 min PRN, Jared M Gardner, DO;  ondansetron [facility]) injection 4 mg, 4 mg, Intravenous, Q6H PRN, Hillary Bow, DO pantoprazole (PROTONIX) EC tablet 40 mg, 40 mg, Oral, Daily, Ripudeep K Rai, MD, 40 mg at 02/01/13 1007;  polyethylene glycol  (MIRALAX / GLYCOLAX) packet 17 g, 17 g, Oral, Daily, Elease Etienne, MD, 17 g at 02/01/13 1116;  senna (SENOKOT) tablet 17.2 mg, 2 tablet, Oral, Daily, Elease Etienne, MD, 17.2 mg at 02/01/13 1116;  tamsulosin (FLOMAX) capsule 0.4 mg, 0.4 mg, Oral, QPC breakfast, Jared M  Julian Reil, DO, 0.4 mg at 02/01/13 1009  Patients Current Diet: Carb Control  Precautions / Restrictions Precautions Precautions: Fall Restrictions Weight Bearing Restrictions: No   Prior Activity Level Community (5-7x/wk): Went out daily.  Home Assistive Devices / Equipment Home Assistive Devices/Equipment: Eyeglasses;Cane (specify quad or straight);Walker (specify type)  Prior Functional Level Prior Function Level of Independence: Independent  Current Functional Level Cognition  Overall Cognitive Status: Within Functional Limits for tasks assessed (need to assess higher level cognition/executive functioning) Orientation Level: Oriented to person;Oriented to place;Disoriented to time    Extremity Assessment (includes Sensation/Coordination)          ADLs  Eating/Feeding: Moderate assistance (Pt R hand dominant but w/dec coordination & dec sensation) Where Assessed - Eating/Feeding: Bed level Toilet Transfer: Minimal assistance;Moderate assistance (mod to R; min to L) Toilet Transfer Method: Stand pivot Toilet Transfer Equipment: Bedside commode Transfers/Ambulation Related to ADLs: did not ambulate; side stepped along EOB w/min-mod A ADL Comments: dec fx'l use of dominant RUE/hand    Mobility  Bed Mobility: Sit to Supine;Supine to Sit;Sitting - Scoot to [place] of Bed;Rolling Left;Rolling Right Rolling Right: 5: Supervision Rolling Left: 4: Min assist Supine to Sit: 3: Mod assist Sitting - Scoot to Edge of Bed: 4: Min guard Sit to Supine: 4: Min guard    Transfers  Sit to Stand: 4: Min assist;From bed;From chair/3-in-1 (cues/assist for RUE assist ) Stand to Sit: 4: Min assist    Ambulation / Gait /  Stairs / Producer, television/film/video Sitting - Balance Support: No upper extremity supported Static Sitting - Level of Assistance: 5: Stand by assistance (close stand by assist; cues for midline) Static Sitting - Comment/# of Minutes: 5 Dynamic Sitting Balance Dynamic Sitting - Balance Support: Feet supported;During functional activity Dynamic Sitting - Level of Assistance: 4: Min Oncologist Standing - Balance Support: Bilateral upper extremity supported Static Standing - Level of Assistance: 4: Min assist Static Standing - Comment/# of Minutes: 1    Special needs/care consideration BiPAP/CPAP No CPM No Continuous Drip IV 0/9% NS 75 ml/hr Dialysis No        Life Vest No Oxygen:  None at home  Special Bed No Trach Size No Wound Vac (area) No      Skin: Bleeds easily from blood thiners                              Bowel mgmt: No BM since admission 01/29/13 Bladder mgmt: Voiding in urinal with 1 accident 2 days ago Diabetic mgmt: Says he is a borderline diabetic.  Was not on insulin at home.    Previous Home Environment Living Arrangements: Spouse/significant other Home Care Services: No  Discharge Living Setting Plans for Discharge Living Setting: Patient's home;House;Lives with (comment) (Lives with wife.) Type of Home at Discharge: House Discharge Home Layout: One level Discharge Home Access: Stairs to enter Entrance Stairs-Number of Steps: 2 Does the patient have any problems obtaining your medications?: No  Social/Family/Support Systems Patient Roles: Spouse;Parent (Has a wife, 3 daughters and 1 son.) Contact Information: Venita Sheffield Strole - wife Anticipated Caregiver: Wife Anticipated Caregiver's Contact Information: Venita Sheffield [phone] Ability/Limitations of Caregiver: Wife is retired and can provide supervision and very light assistance (Children all work.) Caregiver Availability:  24/7 Discharge Plan Discussed with Primary Caregiver: Yes Is Caregiver In Agreement with Plan?: Yes Does Caregiver/Family have  Issues with Lodging/Transportation while Pt is in Rehab?: No  Goals/Additional Needs Patient/Family Goal for Rehab: PT mod I, OT S, ST S/mod I goals Expected length of stay: 7-13 days Cultural Considerations: Arts administrator Dietary Needs: Carb mod med cal, NAS, hearth healthy, thin liquids Equipment Needs: TBD Pt/Family Agrees to Admission and willing to participate: Yes Program Orientation Provided & Reviewed with Pt/Caregiver Including Roles  & Responsibilities: Yes   Decrease burden of Care through IP rehab admission: N/A  Possible need for SNF placement upon discharge: No   Patient Condition: This patient's condition remains as documented in the consult dated 02/01/13, in which the Rehabilitation Physician determined and documented that the patient's condition is appropriate for intensive rehabilitative care in an inpatient rehabilitation facility. Will admit to inpatient rehab today.  Preadmission Screen Completed By:  Trish Mage, 02/01/2013 2:58 PM ______________________________________________________________________   Discussed status with Dr. Riley Kill on 02/01/13 at 1509 and received telephone approval for admission today.  Admission Coordinator:  Trish Mage, time1509/Date11/13/14

## 2013-02-01 NOTE — Interval H&P Note (Signed)
[name] was admitted today to Inpatient Rehabilitation with the diagnosis of left PCA infarct.  The patient's history has been reviewed, patient examined, and there is no change in status.  Patient continues to be appropriate for intensive inpatient rehabilitation.  I have reviewed the patient's chart and labs.  Questions were answered to the patient's satisfaction.  SWARTZ,ZACHARY T 02/01/2013, 6:50 PM

## 2013-02-01 NOTE — Discharge Instructions (Signed)
Angina Pectoris Angina pectoris is extreme discomfort in your chest, neck, or arm. Your doctor may call it just angina. It is caused by a lack of oxygen to your heart wall. It may feel like tightness or heavy pressure. It may feel like a crushing or squeezing pain. Some people say it feels like gas. It may go down your shoulders, back, and arms. Some people have symptoms other than pain. These include:  Tiredness.  Shortness of breath.  Cold sweats.  Feeling sick to your stomach (nausea). There are four types of angina:  Stable angina often lasts the same amount of time each time it happens. Activity, stress, or excitement can bring it on. It often gets better after taking a special medicine called nitroglycerin. This goes under your tongue.  Unstable angina can happen when you are not active or even during sleep. It can suddenly get worse or happen more often. It may not get better after taking the special medicine. It can last up to 30 minutes.  Microvascular angina is more common in women. It may be more severe or last longer than other types.  Prinzmetal angina often happens when you are not active or in the early morning hours. HOME CARE   Only take medicines as told by your doctor.  Stay active or exercise more as told by your doctor.  Limit very hard activity as told by your doctor.  Limit heavy lifting as told by your doctor.  Keep a healthy weight.  Learn about and eat foods that are healthy for your heart.  Do not smoke. GET HELP RIGHT AWAY IF:   You have chest, neck, deep shoulder, or arm pain or discomfort that lasts more than a few minutes.  You have chest, neck, deep shoulder, or arm pain or discomfort that goes away and comes back over and over again.  You have heavy sweating that seems to happen for no reason.  You have shortness of breath or trouble breathing.  Your angina does not get better after a few minutes of rest.  Your angina does not get better  after you take nitroglycerin medicine. These can all be symptoms of a heart attack. Get help right away. Call your local emergency service (911 in [place] Do not  drive yourself to the hospital. Do not  wait to for your symptoms to go away. MAKE SURE YOU:   Understand these instructions.  Will watch your condition.  Will get help right away if you are not doing well or get worse. Document Released: 08/25/2007 Document Revised: 02/23/2012 Document Reviewed: 12/16/2011 [facility] Patient Information 2014 [place], [place].  Chest Pain Observation It is often hard to give a specific diagnosis for the cause of chest pain. Among other possibilities your symptoms might be caused by inadequate oxygen delivery to your heart (angina). Angina that is not treated or evaluated can lead to a heart attack (myocardial infarction) or death. Blood tests, electrocardiograms, and X-rays may have been done to help determine a possible cause of your chest pain. After evaluation and observation, your health care provider has determined that it is unlikely your pain was caused by an unstable condition that requires hospitalization. However, a full evaluation of your pain may need to be completed, with additional diagnostic testing as directed. It is very important to keep your follow-up appointments. Not keeping your follow-up appointments could result in permanent heart damage, disability, or death. If there is any problem keeping your follow-up appointments, you must call your health  care provider. HOME CARE INSTRUCTIONS  Due to the slight chance that your pain could be angina, it is important to follow your health care provider's treatment plan and also maintain a healthy lifestyle:  Maintain or work toward achieving a healthy weight.  Stay physically active and exercise regularly.  Decrease your salt intake.  Eat a balanced, healthy diet. Talk to a dietician to learn about heart healthy foods.  Increase your fiber  intake by including whole grains, vegetables, fruits, and nuts in your diet.  Avoid situations that cause stress, anger, or depression.  Take medicines as advised by your health care provider. Report any side effects to your health care provider. Do not stop medicines or adjust the dosages on your own.  Quit smoking. Do not use nicotine patches or gum until you check with your health care provider.  Keep your blood pressure, blood sugar, and cholesterol levels within normal limits.  Limit alcohol intake to no more than 1 drink per day for women that are not pregnant and 2 drinks per day for men.  Do not abuse drugs. SEEK IMMEDIATE MEDICAL CARE IF: You have severe chest pain or pressure which may include symptoms such as:  You feel pain or pressure in you arms, neck, jaw, or back.  You have severe back or abdominal pain, feel sick to your stomach (nauseous), or throw up (vomit).  You are sweating profusely.  You are having a fast or irregular heartbeat.  You feel short of breath while at rest.  You notice increasing shortness of breath during rest, sleep, or with activity.  You have chest pain that does not get better after rest or after taking your usual medicine.  You wake from sleep with chest pain.  You are unable to sleep because you cannot breathe.  You develop a frequent cough or you are coughing up blood.  You feel dizzy, faint, or experience extreme fatigue.  You develop severe weakness, dizziness, fainting, or chills. Any of these symptoms may represent a serious problem that is an emergency. Do not wait to see if the symptoms will go away. Call your local emergency services (911 in the [place] Do not drive yourself to the hospital. MAKE SURE YOU:  Understand these instructions.  Will watch your condition.  Will get help right away if you are not doing well or get worse. Document Released: 04/10/2010 Document Revised: 11/08/2012 Document Reviewed:  09/07/2012 [facility] Patient Information 2014 [place], [place].  Diabetes, Type 2, Am I At Risk? Diabetes is a lasting (chronic) disease. In type 2 diabetes, the pancreas does not make enough insulin, and the body does not respond normally to the insulin that is made. This type of diabetes was also previously called adult onset diabetes. About 90% of all those who have diabetes have type 2. It usually occurs after the age of 39, but can occur at any age.  People develop type 2 diabetes because they do not use insulin properly. Eventually, the pancreas cannot make enough insulin for the body's needs. Over time, the amount of glucose (sugar) in the blood increases. RISK FACTORS  Overweight  the more weight you have, the more resistant your cells become to insulin.  Family history  you are more likely to get diabetes if a parent or sibling has diabetes.  Race certain races get diabetes more.  African Americans.  American Indians.  Asian Americans.  Hispanics.  Pacific Islander.  Inactive exercise helps control weight and helps your cells be more  sensitive to insulin.  Gestational diabetes  some women develop diabetes while they are pregnant. This goes away when they deliver. However, they are 50-60% more likely to develop type 2 diabetes at a later time.  Having a baby over 9 pounds  a sign that you may have had gestational diabetes.  Age the risk of diabetes goes up as you get older, especially after age 20.  High blood pressure (hypertension). SYMPTOMS Many people have no signs or symptoms. Symptoms can be so mild that you might not even notice them. Some of these signs are:  Increased thirst.  Increased hunger.  Tiredness (fatigue).  Increased urination, especially at night.  Weight loss.  Blurred vision.  Sores that do not heal. WHO SHOULD BE TESTED?  Anyone 45 years or older, especially if overweight, should consider getting tested.  If you are younger than 45,  overweight, and have one or more of the risk factors, you should consider getting tested. DIAGNOSIS  Fasting blood glucose (FBS). Usually, 2 are done.  FBS 101-125 mg/dl is considered pre-diabetes.  FBS 126 mg/dl or greater is considered diabetes.  2 hour Oral Glucose Tolerance Test (OGTT). This test is preformed by first having you not eat or drink for several hours. You are then given something sweet to drink and your blood glucose is measured fasting, at one hour and 2 hours. This test tells how well you are able to handle sugars or carbohydrates.  Fasting: 60-100 mg/dl.  1 hour: less than 200 mg/dl.  2 hours: less than 140 mg/dl.  A1c A1c is a blood glucose test that gives and average of your blood glucose over 3 months. It is the accepted method to use to diagnose diabetes.  A1c 5.7-6.4% is considered pre-diabetes.  A1c 6.5% or greater is considered diabetes. WHAT DOES IT MEAN TO HAVE PRE-DIABETES? Pre-diabetes means you are at risk for getting type 2 diabetes. Your blood glucose is higher than normal, but not yet high enough to diagnose diabetes. The good news is, if you have pre-diabetes you can reduce the risk of getting diabetes and even return to normal blood glucose levels. With modest weight loss and moderate physical activity, you can delay or prevent type 2 diabetes.  PREVENTION You cannot do anything about race, age or family history, but you can lower your chances of getting diabetes. You can:   Exercise regularly and be active.  Reduce fat and calorie intake.  Make wise food choices as much as you can.  Reduce your intake of salt and alcohol.  Maintain a reasonable weight.  Keep blood pressure in an acceptable range. Take medication if needed.  Not smoke.  Maintain an acceptable cholesterol level (HDL, LDL, Triglycerides). Take medication if needed. DOING MY PART: GETTING STARTED Making big changes in your life is hard, especially if you are faced with more  than one change. You can make it easier by taking these steps:  Make a plan to change behavior.  Decide exactly what you will do and when you will do it.  Plan what you need to get ready.  Think about what might prevent you from reaching your goals.  Find family and friends who will support and encourage you.  Decide how you will reward yourself when you do what you have planned.  Your doctor, dietitian, or counselor can help you make a plan. HERE ARE SOME OF THE AREAS YOU MAY WISH TO CHANGE TO REDUCE YOUR RISK OF DIABETES. If you are overweight  or obese, choose sensible ways to get in shape. Even small amounts of weight loss, like 5-10 pounds, can help reduce the effects of insulin resistance and help blood glucose control. Diet  Avoid crash diets. Instead, eat less of the foods you usually have. Limit the amount of fat you eat.  Increase your physical activity. Aim for at least 30 minutes of exercise most days of the week.  Set a reasonable weight-loss goal, such as losing 1 pound a week. Aim for a long-term goal of losing 5-7% of your total body weight.  Make wise food choices most of the time.  What you eat has a big impact on your health. By making wise food choices, you can help control your body weight, blood pressure, and cholesterol.  Take a hard look at the serving sizes of the foods you eat. Reduce serving sizes of meat, desserts, and foods high in fat. Increase your intake of fruits and vegetables.  Limit your fat intake to about 25% of your total calories. For example, if your food choices add up to about 2,000 calories a day, try to eat no more than 56 grams of fat. Your caregiver or a dietitian can help you figure out how much fat to have. You can check food labels for fat content too.  You may also want to reduce the number of calories you have each day.  Keep a food log. Write down what you eat, how much you eat, and anything else that helps keep you on  track.  When you meet your goal, reward yourself with a nonfood item or activity. Exercise  Be physically active every day.  Keep and exercise log. Write down what exercise you did, for how long, and anything else that keeps you on track.  Regular exercise (like brisk walking) tackles several risk factors at once. It helps you lose weight, it keeps your cholesterol and blood pressure under control, and it helps your body use insulin. People who are physically active for 30 minutes a day, 5 days a week, reduced their risk of type 2 diabetes. If you are not very active, you should start slowly at first. Talk with your caregiver first about what kinds of exercise would be safe for you. Make a plan to increase your activity level with the goal of being active for at least 30 minutes a day, most days of the week.  Choose activities you enjoy. Here are some ways to work extra activity into your daily routine:  Take the stairs rather than an elevator or escalator.  Park at the far end of the lot and walk.  Get off the bus a few stops early and walk the rest of the way.  Walk or bicycle instead of drive whenever you can. Medications Some people need medication to help control their blood pressure or cholesterol levels. If you do, take your medicines as directed. Ask your caregiver whether there are any medicines you can take to prevent type 2 diabetes. Document Released: 03/11/2003 Document Revised: 05/31/2011 Document Reviewed: 12/04/2008 [facility] Patient Information 2014 [place], [place]. STROKE/TIA DISCHARGE INSTRUCTIONS SMOKING Cigarette smoking nearly doubles your risk of having a stroke & is the single most alterable risk factor  If you smoke or have smoked in the last 12 months, you are advised to quit smoking for your health.  Most of the excess cardiovascular risk related to smoking disappears within a year of stopping.  Ask you doctor about anti-smoking medications  [place] Quit Line:  1-800-QUIT NOW  Free Smoking Cessation Classes [phone]  CHOLESTEROL Know your levels; limit fat & cholesterol in your diet  Lipid Panel     Component Value Date/Time   CHOL 147 01/30/2013 2315   TRIG 86 01/30/2013 2315   HDL 54 01/30/2013 2315   CHOLHDL 2.7 01/30/2013 2315   VLDL 17 01/30/2013 2315   LDLCALC 76 01/30/2013 2315      Many patients benefit from treatment even if their cholesterol is at goal.  Goal: Total Cholesterol (CHOL) less than 160  Goal:  Triglycerides (TRIG) less than 150  Goal:  HDL greater than 40  Goal:  LDL (LDLCALC) less than 100   BLOOD PRESSURE American Stroke Association blood pressure target is less that 120/80 mm/Hg  Your discharge blood pressure is:  BP: 114/64 mmHg  Monitor your blood pressure  Limit your salt and alcohol intake  Many individuals will require more than one medication for high blood pressure  DIABETES (A1c is a blood sugar average for last 3 months) Goal HGBA1c is under 7% (HBGA1c is blood sugar average for last 3 months)  Diabetes: Diagnosis of diabetes:  Your A1c:6.5 %    Lab Results  Component Value Date   HGBA1C 6.5* 01/30/2013     Your HGBA1c can be lowered with medications, healthy diet, and exercise.  Check your blood sugar as directed by your physician  Call your physician if you experience unexplained or low blood sugars.  PHYSICAL ACTIVITY/REHABILITATION Goal is 30 minutes at least 4 days per week  Activity: Increase activity slowly, Therapies: Physical Therapy: Inpatient Rehab Unit at [facility] and Occupational Therapy: Inpatient Rehab Unit at [facility] Return to work: Going to inpatient rehab  Activity decreases your risk of heart attack and stroke and makes your heart stronger.  It helps control your weight and blood pressure; helps you relax and can improve your mood.  Participate in a regular exercise program.  Talk with your doctor about the best form of exercise for you (dancing, walking,  swimming, cycling).  DIET/WEIGHT Goal is to maintain a healthy weight  Your discharge diet is: Carb Control thin liquids Your height is:  Height: 5\' 7"  (170.2 cm) Your current weight is: Weight: 100.789 kg (222 lb 3.2 oz) Your Body Mass Index (BMI) is:  BMI (Calculated): 34.9  Following the type of diet specifically designed for you will help prevent another stroke.  Your goal weight range is:  133-163  Your goal Body Mass Index (BMI) is 19-24.  Healthy food habits can help reduce 3 risk factors for stroke:  High cholesterol, hypertension, and excess weight.  RESOURCES Stroke/Support Group:  Call [phone]   STROKE EDUCATION PROVIDED/REVIEWED AND GIVEN TO PATIENT Stroke warning signs and symptoms How to activate emergency medical system (call 911). Medications prescribed at discharge. Need for follow-up after discharge. Personal risk factors for stroke. Pneumonia vaccine given: No Flu vaccine given: No My questions have been answered, the writing is legible, and I understand these instructions.  I will adhere to these goals & educational materials that have been provided to me after my discharge from the hospital.

## 2013-02-01 NOTE — Care Management Note (Signed)
    Page 1 of 1   02/01/2013     2:24:37 PM   CARE MANAGEMENT NOTE 02/01/2013  Patient:  [name], [name]   Account Number:  [id number]  Date Initiated:  02/01/2013  Documentation initiated by:  GRAVES-BIGELOW,BRENDA  Subjective/Objective Assessment:   Pt admitted for Chest pain. S/p cath. MRI shows bilateral anterior posterior infarcts. Plan for CIR today.     Action/Plan:   No needs from CM at this time.   Anticipated DC Date:  02/01/2013   Anticipated DC Plan:  IP [facility]HAB FACILITY      DC Planning Services  CM consult      Choice offered to / List presented to:             Status of service:  Completed, signed off Medicare Important Message given?   (If response is "NO", the following Medicare IM given date fields will be blank) Date Medicare IM given:   Date Additional Medicare IM given:    Discharge Disposition:  IP REHAB FACILITY  Per UR Regulation:  Reviewed for med. necessity/level of care/duration of stay  If discussed at Long Length of Stay Meetings, dates discussed:    Comments:

## 2013-02-01 NOTE — H&P (View-Only) (Signed)
Physical Medicine and Rehabilitation Admission H&P    Chief Complaint  Patient presents with  . Chest Pain   : Chief complaint: Right side weakness  HPI: [name] is a 79 y.o. right-handed male with history of CAD/MI with stenting her last cardiac catheterization 2004, hypertension and seizure disorder. Admitted 01/29/2013 with left-sided chest pain while raking leaves. He did receive nitroglycerin in the ED with some relief. EKG no significant ST segment changes.  Troponin negative. Underwent cardiac catheterization 01/30/2013 per Dr.McLean that showed 40-50% proximal LAD stenosis and advised medical management. Noted post catheterization with right facial numbness as well as slurred speech.  MRI shows bilateral anterior posterior infarcts. MRA confirms findings left PCA occlusion. Echocardiogram 02/01/2003 Taylor he 55% grade 1 diastolic dysfunction. Carotid Dopplers with left 60-79% ICA stenosis. Patient did not receive TPA. Maintained on Plavix therapy for CVA prophylaxis as well as cardiac history as well as the addition of subcutaneous heparin for DVT prophylaxis. Occupational therapy evaluation completed 01/31/2013 recommendations of physical medicine rehabilitation consult. Patient was felt to be a good candidate for inpatient rehabilitation services and was admitted for a comprehensive rehabilitation program after consultation of the rehab team.    ROS Review of Systems  Cardiovascular: Positive for chest pain.  Musculoskeletal: Positive for myalgias.  Neurological: Positive for dizziness and seizures.  Genitourinary: Urgency All other systems reviewed and are negative  Past Medical History  Diagnosis Date  . Coronary artery disease     s/p MI tx with Cypher DES to pRCA in 4/04; LHC 4/04 in the setting of his MI: Proximal LAD 30%, ostial diagonal 40-50%, ostial OM1 30%, proximal RCA 95% which was the infarct vessel   ;    Echocardiogram 7/09: Normal LV function.  Nuclear  study  March 2013 negative for ischemia  . Dyslipidemia   . Seizure disorder   . Hypothyroidism   . DDD (degenerative disc disease)   . Hx MRSA infection     left buttocks abscess  . Rotator cuff injury     chronic rotator cuff injury status post repair  . Hypertension   . Syncope   . Dizziness   . Meniere's disease     Status post shunt  . BPH (benign prostatic hyperplasia)   . Stenosis of left internal carotid artery     40-59% on carotid doppler 2014  . Diabetes mellitus without complication   . Hyperlipidemia    Past Surgical History  Procedure Laterality Date  . Coronary angioplasty with stent placement  07/14/2002    Stent to the right coronary artery  . Rotator cuff repair      for chronic rotator cuff injury  . Hernia repair  04/10/2008    scrotal hernia repair  . Endolymphatic shunt decompression  06/11/2009     Right endolymphatic sac decompression and shunt  placement   Family History  Problem Relation Age of Onset  . Heart attack Father 71  . Aneurysm Mother     brain aneurysm  . Coronary artery disease Brother     with CABG   Social History:  reports that he quit smoking about 51 years ago. He does not have any smokeless tobacco history on file. He reports that he does not drink alcohol or use illicit drugs. Allergies: No Known Allergies Medications Prior to Admission  Medication Sig Dispense Refill  . atorvastatin (LIPITOR) 40 MG tablet Take 2 tablets (80 mg total) by mouth daily.  60 tablet  5  . carbamazepine (  CARBATROL) 300 MG 12 hr capsule Take 300 mg by mouth 2 (two) times daily.      . cloNIDine (CATAPRES) 0.2 MG tablet Take 0.2 mg by mouth daily.      . clopidogrel (PLAVIX) 75 MG tablet Take 1 tablet (75 mg total) by mouth daily.  30 tablet  5  . diazepam (VALIUM) 2 MG tablet Take 2 mg by mouth 2 (two) times daily.      . fluticasone (FLONASE) 50 MCG/ACT nasal spray Place 1 spray into the nose daily as needed for allergies.       .  hydrochlorothiazide (HYDRODIURIL) 25 MG tablet Take 1 tablet (25 mg total) by mouth daily.  30 tablet  5  . levothyroxine (SYNTHROID, LEVOTHROID) 25 MCG tablet Take 50 mcg by mouth daily before breakfast.      . losartan (COZAAR) 50 MG tablet Take 1 tablet (50 mg total) by mouth 2 (two) times daily.  60 tablet  11  . metFORMIN (GLUCOPHAGE) 500 MG tablet Take 500 mg by mouth 2 (two) times daily with a meal.      . metoprolol (LOPRESSOR) 50 MG tablet Take 50 mg by mouth 2 (two) times daily.      . Multiple Vitamins-Minerals (MULTIVITAMINS THER. W/MINERALS) TABS Take 1 tablet by mouth at bedtime.        . nitroGLYCERIN (NITROSTAT) 0.4 MG SL tablet Place 1 tablet (0.4 mg total) under the tongue every 5 (five) minutes as needed for chest pain.  25 tablet  3  . silodosin (RAPAFLO) 8 MG CAPS capsule Take 8 mg by mouth daily with breakfast.         Home: Home Living Family/patient expects to be discharged to:: Inpatient rehab Living Arrangements: Spouse/significant other   Functional History:    Functional Status:  Mobility:          ADL:    Cognition: Cognition Overall Cognitive Status: Within Functional Limits for tasks assessed Orientation Level: Oriented X4 Cognition Arousal/Alertness: Awake/alert Behavior During Therapy: WFL for tasks assessed/performed Overall Cognitive Status: Within Functional Limits for tasks assessed  Physical Exam: Blood pressure 113/62, pulse 64, temperature 97.1 F (36.2 C), temperature source Oral, resp. rate 18, height 5' 7" (1.702 m), weight 100.789 kg (222 lb 3.2 oz), SpO2 97.00%. Physical Exam Constitutional: He is oriented to person, place, and time. He appears well-developed and well-nourished.  HENT:  Head: Normocephalic and atraumatic.  Right Ear: External ear normal.  Left Ear: External ear normal.  Eyes: EOM are normal. Right eye exhibits no discharge. Left eye exhibits no discharge.  Neck: Normal range of motion. Neck supple. No JVD  present. No tracheal deviation present. No thyromegaly present.  Cardiovascular: Normal rate and regular rhythm.  Respiratory: Effort normal and breath sounds normal. No respiratory distress.  GI: Soft. Bowel sounds are normal. He exhibits no distension. There is no tenderness. There is no rebound.  Musculoskeletal: He exhibits no edema and no tenderness.  Lymphadenopathy:  He has no cervical adenopathy.  Neurological: He is alert and oriented to person, place, and time.  Mild right facial weakness. Decreased sensation around the mouth and tongue. RUE 4/5. RLE 4 to 4+/5. Right upper and lower limb ataxia. Speech slurred. With extra time and good phonation he was intelligible. Reasonable insight and awareness. ?mild processing delays. STM functional.  Skin: Skin is warm and dry.  Psychiatric: He has a normal mood and affect. His behavior is normal. Judgment and thought content normal  Results for orders   placed during the hospital encounter of 01/29/13 (from the past 48 hour(s))  GLUCOSE, CAPILLARY     Status: Abnormal   Collection Time    01/30/13  4:31 PM      Result Value Range   Glucose-Capillary 129 (*) 70 - 99 mg/dL  TROPONIN I     Status: None   Collection Time    01/30/13  4:45 PM      Result Value Range   Troponin I <0.30  <0.30 ng/mL   Comment:            Due to the release kinetics of cTnI,     a negative result within the first hours     of the onset of symptoms does not rule out     myocardial infarction with certainty.     If myocardial infarction is still suspected,     repeat the test at appropriate intervals.  GLUCOSE, CAPILLARY     Status: Abnormal   Collection Time    01/30/13  9:35 PM      Result Value Range   Glucose-Capillary 166 (*) 70 - 99 mg/dL   Comment 1 Notify RN    TROPONIN I     Status: None   Collection Time    01/30/13 11:15 PM      Result Value Range   Troponin I <0.30  <0.30 ng/mL   Comment:            Due to the release kinetics of cTnI,      a negative result within the first hours     of the onset of symptoms does not rule out     myocardial infarction with certainty.     If myocardial infarction is still suspected,     repeat the test at appropriate intervals.  HEMOGLOBIN A1C     Status: Abnormal   Collection Time    01/30/13 11:15 PM      Result Value Range   Hemoglobin A1C 6.5 (*) <5.7 %   Comment: (NOTE)                                                                               According to the ADA Clinical Practice Recommendations for 2011, when     HbA1c is used as a screening test:      >=6.5%   Diagnostic of Diabetes Mellitus               (if abnormal result is confirmed)     5.7-6.4%   Increased risk of developing Diabetes Mellitus     References:Diagnosis and Classification of Diabetes Mellitus,Diabetes     Care,2011,34(Suppl 1):S62-S69 and Standards of Medical Care in             Diabetes - 2011,Diabetes Care,2011,34 (Suppl 1):S11-S61.   Mean Plasma Glucose 140 (*) <117 mg/dL   Comment: Performed at [facility]  LIPID PANEL     Status: None   Collection Time    01/30/13 11:15 PM      Result Value Range   Cholesterol 147  0 - 200 mg/dL   Triglycerides 86  <150 mg/dL   HDL 54  >39   mg/dL   Total CHOL/HDL Ratio 2.7     VLDL 17  0 - 40 mg/dL   LDL Cholesterol 76  0 - 99 mg/dL   Comment:            Total Cholesterol/HDL:CHD Risk     Coronary Heart Disease Risk Table                         Men   Women      1/2 Average Risk   3.4   3.3      Average Risk       5.0   4.4      2 X Average Risk   9.6   7.1      3 X Average Risk  23.4   11.0                Use the calculated Patient Ratio     above and the CHD Risk Table     to determine the patient's CHD Risk.                ATP III CLASSIFICATION (LDL):      <100     mg/dL   Optimal      100-129  mg/dL   Near or Above                        Optimal      130-159  mg/dL   Borderline      160-189  mg/dL   High      >190     mg/dL   Very High   TROPONIN I     Status: None   Collection Time    01/31/13  4:49 AM      Result Value Range   Troponin I <0.30  <0.30 ng/mL   Comment:            Due to the release kinetics of cTnI,     a negative result within the first hours     of the onset of symptoms does not rule out     myocardial infarction with certainty.     If myocardial infarction is still suspected,     repeat the test at appropriate intervals.  GLUCOSE, CAPILLARY     Status: Abnormal   Collection Time    01/31/13  9:07 AM      Result Value Range   Glucose-Capillary 124 (*) 70 - 99 mg/dL  GLUCOSE, CAPILLARY     Status: Abnormal   Collection Time    01/31/13 11:49 AM      Result Value Range   Glucose-Capillary 145 (*) 70 - 99 mg/dL  TROPONIN I     Status: None   Collection Time    01/31/13 11:54 AM      Result Value Range   Troponin I <0.30  <0.30 ng/mL   Comment:            Due to the release kinetics of cTnI,     a negative result within the first hours     of the onset of symptoms does not rule out     myocardial infarction with certainty.     If myocardial infarction is still suspected,     repeat the test at appropriate intervals.  GLUCOSE, CAPILLARY     Status: Abnormal   Collection Time    01/31/13  4:44 PM        Result Value Range   Glucose-Capillary 129 (*) 70 - 99 mg/dL  TROPONIN I     Status: None   Collection Time    01/31/13  7:10 PM      Result Value Range   Troponin I <0.30  <0.30 ng/mL   Comment:            Due to the release kinetics of cTnI,     a negative result within the first hours     of the onset of symptoms does not rule out     myocardial infarction with certainty.     If myocardial infarction is still suspected,     repeat the test at appropriate intervals.  GLUCOSE, CAPILLARY     Status: Abnormal   Collection Time    01/31/13  8:45 PM      Result Value Range   Glucose-Capillary 141 (*) 70 - 99 mg/dL   Comment 1 Notify RN    TROPONIN I     Status: None   Collection Time     02/01/13 12:12 AM      Result Value Range   Troponin I <0.30  <0.30 ng/mL   Comment:            Due to the release kinetics of cTnI,     a negative result within the first hours     of the onset of symptoms does not rule out     myocardial infarction with certainty.     If myocardial infarction is still suspected,     repeat the test at appropriate intervals.  GLUCOSE, CAPILLARY     Status: Abnormal   Collection Time    02/01/13  8:08 AM      Result Value Range   Glucose-Capillary 142 (*) 70 - 99 mg/dL   Comment 1 Documented in Chart     Comment 2 Notify RN    GLUCOSE, CAPILLARY     Status: Abnormal   Collection Time    02/01/13 11:45 AM      Result Value Range   Glucose-Capillary 124 (*) 70 - 99 mg/dL   Comment 1 Documented in Chart     Comment 2 Notify RN     Mr Mra Head Wo Contrast  01/31/2013   CLINICAL DATA:  Multiple infarctions  EXAM: MRA HEAD WITHOUT CONTRAST  TECHNIQUE: Angiographic images of the Circle of Willis were obtained using MRA technique without intravenous contrast.  COMPARISON:  01/30/2013  FINDINGS: Both internal carotid arteries are widely patent into the brain. No siphon stenosis. The anterior and middle cerebral vessels are patent without proximal stenosis, aneurysm or vascular malformation.  Both vertebral arteries are patent with the right being larger than the left. There is some atherosclerotic irregularity, particularly of the right vertebral artery. No flow-limiting stenosis.  The basilar artery shows slight atherosclerotic irregularity but no stenosis.  The left posterior cerebral artery is occluded 1.5 cm beyond its origin. There is severe stenosis of the right PCA 1 cm beyond its origin but the vessel does show persistent flow distally.  IMPRESSION: Occlusion of the left posterior cerebral artery 1.5 cm beyond its origin. Severe stenosis of the right PCA 1 cm beyond its origin. Atherosclerotic irregularity of the dominant right vertebral artery without  of visible flow-limiting stenosis .   Electronically Signed   By: Mark  Shogry M.D.   On: 01/31/2013 09:04   [name]  01/30/2013   CLINICAL DATA:  Perioral numbness following   cardiac catheterization. Slurred speech.  EXAM: MRI HEAD WITHOUT CONTRAST  TECHNIQUE: Multiplanar, multiecho pulse sequences of the brain and surrounding structures were obtained without intravenous contrast.  COMPARISON:  12/04/2012 MRI from [facility].  FINDINGS: Multiple subcentimeter areas of restricted diffusion representing acute infarction are seen throughout both hemispheres, most notably the left thalamus, without hemorrhage. These are new from the prior MRI in September and represent what are likely multiple emboli.  No mass lesion, hydrocephalus, or extra-axial fluid.  Moderate cerebral and cerebellar atrophy. Mild subcortical and periventricular T2 and FLAIR hyperintensities, likely chronic microvascular ischemic change. Flow voids are maintained throughout the carotid, basilar, and vertebral arteries. There are no areas of chronic hemorrhage. Pituitary, pineal, and cerebellar tonsils unremarkable. No upper cervical lesions. Visualized calvarium, skull base, and upper cervical osseous structures unremarkable. Scalp, orbits, sinuses, and mastoids show no acute process.  Incidental note is made of a superficial lobe left parotid ovoid lesion 5 x 10 mm cross-section probably a lymph node, stable from September.  IMPRESSION: Multiple subcentimeter areas of restricted diffusion without hemorrhage representing acute infarction likely related to a shower of emboli.  These results will be called to the ordering clinician or representative by the Radiologist Assistant, and communication documented in the PACS Dashboard.   Electronically Signed   By: John  Curnes M.D.   On: 01/30/2013 19:04    Post Admission Physician Evaluation: 1. Functional deficits secondary  to embolic, left pca/thalamic  infarct. 2. Patient is admitted to receive collaborative, interdisciplinary care between the physiatrist, rehab nursing staff, and therapy team. 3. Patient's level of medical complexity and substantial therapy needs in context of that medical necessity cannot be provided at a lesser intensity of care such as a SNF. 4. Patient has experienced substantial functional loss from his/her baseline which was documented above under the "Functional History" and "Functional Status" headings.  Judging by the patient's diagnosis, physical exam, and functional history, the patient has potential for functional progress which will result in measurable gains while on inpatient rehab.  These gains will be of substantial and practical use upon discharge  in facilitating mobility and self-care at the household level. 5. Physiatrist will provide 24 hour management of medical needs as well as oversight of the therapy plan/treatment and provide guidance as appropriate regarding the interaction of the two. 6. 24 hour rehab nursing will assist with bladder management, bowel management, safety, skin/wound care, disease management, medication administration, pain management and patient education  and help integrate therapy concepts, techniques,education, etc. 7. PT will assess and treat for/with: Lower extremity strength, range of motion, stamina, balance, functional mobility, safety, adaptive techniques and equipment, vestibular rx, education.   Goals are: mod I to supervision. 8. OT will assess and treat for/with: ADL's, functional mobility, safety, upper extremity strength, adaptive techniques and equipment, NMR, vestibular rx, education.   Goals are: supervision to mod I. 9. SLP will assess and treat for/with: speech, communication, cognition.  Goals are: mod i to supervision. 10. Case Management and Social Worker will assess and treat for psychological issues and discharge planning. 11. Team conference will be held weekly to  assess progress toward goals and to determine barriers to discharge. 12. Patient will receive at least 3 hours of therapy per day at least 5 days per week. 13. ELOS: 8-13 days       14. Prognosis:  excellent   Medical Problem List and Plan: 1. Embolic infarcts left PCA distribution, most prominently involving left thalamus after cardiac catheterization 2.   DVT Prophylaxis/Anticoagulation: Subcutaneous heparin. Monitor platelet counts and any signs of bleeding 3. Pain Management: Tylenol as needed. Monitor with increased mobility 4. Neuropsych: This patient is capable of making decisions on his own behalf. 5. CAD status post catheterization 01/30/2013. Followup cardiac services. Continue medical management with Plavix therapy 6. Seizure disorder. Tegretol 300 mg twice a day, Valium 2 mg twice a day. Monitor for any signs of seizure 7. Hypertension. Lopressor 50 mg twice a day, Cozaar 50 mg twice a day, Imdur 30 mg daily, hydrochlorothiazide 25 mg daily, clonidine 0.2 mg daily. Monitor with increased mobility 8. Diabetes mellitus with peripheral neuropathy. Hemoglobin A1c 6.5. Sliding scale insulin her present. Check CBGs a.c. and at bedtime. Patient on Glucophage 500 mg twice a day prior to admission and resume as tolerated. 9. Hypothyroidism. Synthroid 10. BPH. Flomax. Check PVRs x3. 11. Hyperlipidemia.  Lipitor  Zachary T. Swartz, MD, FAAPMR [facility]  02/01/2013 

## 2013-02-01 NOTE — Progress Notes (Signed)
Patient ID: [name], male   DOB: 04/30/1933, 79 y.o.   MRN: [id number] Patient admitted to 4W-19 via wheelchair, escorted by nursing staff and family.  Patient verbalized understanding of rehab process, no questions asked.  Patient signed and acknowledged fall policy and agrees to call if needs anything.  Appears to be in no immediate distress at this time. [name], RN

## 2013-02-01 NOTE — Progress Notes (Signed)
Stroke Team Progress Note  HISTORY [name] is an 79 y.o. male with a past medical history significant for dyslipidemia, HTN, DM, CAD s/p MI, syncope, hypothyroidism, TGA couple of months ago, Meniere's disease, who underwent right radial cardiac cath this morning 01/30/2013 and this afternoon was supposed to be discharged when he reported having a numb sensation around his lips and right face and then was noted to be off balance and leaning to the right. Family stated that his speech was " slurred but we thought it was from sedatives". He denies associated HA, vertigo, double vision, difficulty swallowing, focal weakness, confusion, or visual disturbance. Evaluated by the rapid response nurse and NIHSS 1 reported. At this moment he said that he is doing better but the right face/lips still " little bit numb". Patient was not a TPA candidate secondary to NIHSS =1 with only numbness (too mild to treat). He was admitted for further evaluation and treatment.  SUBJECTIVE Wife and daughter at the bedside. Had some neuro worsening yesterday with increasing dysphagia and right hemiparesis. Better this am.  OBJECTIVE Most recent Vital Signs: Filed Vitals:   01/31/13 2229 02/01/13 0000 02/01/13 0400 02/01/13 0822  BP: 140/67 137/62 191/96 160/72  Pulse: 72 66 84 82  Temp:   97.6 F (36.4 C) 97.6 F (36.4 C)  TempSrc:   Oral Oral  Resp:   18 18  Height:      Weight:      SpO2:  94% 93% 93%   CBG (last 3)   Recent Labs  01/31/13 1644 01/31/13 2045 02/01/13 0808  GLUCAP 129* 141* 142*    IV Fluid Intake:   . sodium chloride Stopped (01/31/13 1159)    MEDICATIONS  . atorvastatin  80 mg Oral Daily  . carbamazepine  300 mg Oral BID  . cloNIDine  0.2 mg Oral Daily  . clopidogrel  75 mg Oral Daily  . diazepam  2 mg Oral BID  . heparin  5,000 Units Subcutaneous Q8H  . hydrochlorothiazide  25 mg Oral Daily  . insulin aspart  0-9 Units Subcutaneous TID AC & HS  . isosorbide  mononitrate  30 mg Oral Daily  . levothyroxine  50 mcg Oral QAC breakfast  . losartan  50 mg Oral BID  . metoprolol  50 mg Oral BID  . pantoprazole  40 mg Oral Daily  . tamsulosin  0.4 mg Oral QPC breakfast   PRN:  acetaminophen, fluticasone, nitroGLYCERIN, nitroGLYCERIN, ondansetron (ZOFRAN) IV  Diet:  Carb Control thin liquids Activity:  As tolerated DVT Prophylaxis:  Heparin 5000 units sq tid   CLINICALLY SIGNIFICANT STUDIES Basic Metabolic Panel:   Recent Labs Lab 01/29/13 2132 01/30/13 1158  NA 136  --   K 3.7  --   CL 96  --   CO2 28  --   GLUCOSE 100*  --   BUN 18  --   CREATININE 0.96 0.81  CALCIUM 10.3  --    Liver Function Tests: No results found for this basename: AST, ALT, ALKPHOS, BILITOT, PROT, ALBUMIN,  in the last 168 hours CBC:   Recent Labs Lab 01/29/13 2132 01/30/13 1158  WBC 13.8* 8.7  HGB 14.7 13.2  HCT 42.4 38.6*  MCV 90.4 90.8  PLT 287 264   Coagulation:   Recent Labs Lab 01/30/13 1159  LABPROT 12.8  INR 0.98   Cardiac Enzymes:   Recent Labs Lab 01/31/13 1154 01/31/13 1910 02/01/13 0012  TROPONINI <0.30 <0.30 <0.30  Urinalysis: No results found for this basename: COLORURINE, APPERANCEUR, LABSPEC, PHURINE, GLUCOSEU, HGBUR, BILIRUBINUR, KETONESUR, PROTEINUR, UROBILINOGEN, NITRITE, LEUKOCYTESUR,  in the last 168 hours Lipid Panel    Component Value Date/Time   CHOL 147 01/30/2013 2315   TRIG 86 01/30/2013 2315   HDL 54 01/30/2013 2315   CHOLHDL 2.7 01/30/2013 2315   VLDL 17 01/30/2013 2315   LDLCALC 76 01/30/2013 2315   HgbA1C  Lab Results  Component Value Date   HGBA1C 6.5* 01/30/2013    Urine Drug Screen:   No results found for this basename: labopia,  cocainscrnur,  labbenz,  amphetmu,  thcu,  labbarb    Alcohol Level: No results found for this basename: ETH,  in the last 168 hours   CT of the brain    MRI of the brain  01/30/2013   Multiple subcentimeter areas of restricted diffusion without hemorrhage  representing acute infarction likely related to a shower of emboli.    MRA of the brain  01/31/2013  Occlusion of the left posterior cerebral artery 1.5 cm beyond its origin. Severe stenosis of the right PCA 1 cm beyond its origin. Atherosclerotic irregularity of the dominant right vertebral artery without of visible flow-limiting stenosis.  2D Echocardiogram  EF 50-55% with no source of embolus.   Carotid Doppler    CXR  01/29/2013   No active cardiopulmonary disease.  EKG  normal sinus rhythm, RBBB.   Therapy Recommendations CIR  Physical Exam   Pleasant elderly caucasian male not in distress.Awake alert. Afebrile. Head is nontraumatic. Neck is supple without bruit. Hearing is normal. Cardiac exam no murmur or gallop. Lungs are clear to auscultation. Distal pulses are well felt. Neurological Exam ; awake alert oriented x3 with normal speech and language function. No aphasia, dysarthria or apraxia. Extraocular movements are full range without nystagmus. Partial right homonymous hemianopsia to bedside confrontational testing. Face is symmetric without weakness. Tongue is midline. Motor system exam reveals no upper or lower eczema to drift. Symmetric and equal strength in all studies. No focal weakness. Sensation appears preserved. Gait was not tested.  ASSESSMENT Mr. DWAIN HUHN is a 79 y.o. male presenting with acute onset perioral/right face numbness and unsteadiness after cardiac cath. Imaging confirms a bilateral anterior and posterior infarcts, the largest being in the left thalamus. MRA confirms findings with left PCA occlusion 1.5 cm beyond its origin and  right PCA severe stenosis 1 cm beyond its origin.  This helps to confirm Infarcts felt to be embolic secondary to post cath complication.  Neuro worsening within first 24h with increasing dysarthria and right hemiparesis, which is improving but not resolved. This worsening is not unusual given the location/etiology of his stroke.  Improvement is expected over time. On clopidogrel 75 mg orally every day prior to admission. Now on clopidogrel 75 mg orally every day for secondary stroke prevention. Patient with resultant right peripheral vision field cut, right sided numbness (hemisensory deficit), dysarthria, right hemiparesis. Work up completed.  Hypertension   CAD - s/p cath 11/11, CABG 2004 Hyperlipidemia, LDL 76, on lipitor PTA, now on lipitor, goal LDL < 100 (< 70 for diabetics) Seizure disorder L ICA stenosis 60-79% Diabetes, HgbA1c 6.5, goal < 7.0  Hospital day # 3  TREATMENT/PLAN  Continue clopidogrel 75 mg orally every day for secondary stroke prevention.  Ok to be OOB.  PT and OT evals, CIR consult.  No driving at time of discharge  Dr. Pearlean Brownie discussed diagnosis, prognosis,  treatment options and plan of  care with wife and daughter.     Annie Main, MSN, RN, ANVP-BC, ANP-BC, [facility] Pager: [phone] 02/01/2013 9:22 AM  I have personally obtained a history, examined the patient, evaluated imaging results, and formulated the assessment and plan of care. I agree with the above.  Delia Heady, MD

## 2013-02-01 NOTE — Progress Notes (Signed)
   SUBJECTIVE:  The patient denies any chest pain. He did have some slightly worsening mental status and speech yesterday that responded to lying flat. He denies any shortness of breath.   PHYSICAL EXAM Filed Vitals:   01/31/13 2229 02/01/13 0000 02/01/13 0400 02/01/13 0822  BP: 140/67 137/62 191/96 160/72  Pulse: 72 66 84 82  Temp:   97.6 F (36.4 C) 97.6 F (36.4 C)  TempSrc:   Oral Oral  Resp:   18 18  Height:      Weight:      SpO2:  94% 93% 93%   General:  No distress Lungs:  Clear Heart:  RRR Abdomen:  Positive bowel sounds, no rebound no guarding Extremities:  No edema Neuro:  Right grip strength slightly reduced.   LABS: Lab Results  Component Value Date   TROPONINI <0.30 02/01/2013   Results for orders placed during the hospital encounter of 01/29/13 (from the past 24 hour(s))  GLUCOSE, CAPILLARY     Status: Abnormal   Collection Time    01/31/13  9:07 AM      Result Value Range   Glucose-Capillary 124 (*) 70 - 99 mg/dL  GLUCOSE, CAPILLARY     Status: Abnormal   Collection Time    01/31/13 11:49 AM      Result Value Range   Glucose-Capillary 145 (*) 70 - 99 mg/dL  TROPONIN I     Status: None   Collection Time    01/31/13 11:54 AM      Result Value Range   Troponin I <0.30  <0.30 ng/mL  GLUCOSE, CAPILLARY     Status: Abnormal   Collection Time    01/31/13  4:44 PM      Result Value Range   Glucose-Capillary 129 (*) 70 - 99 mg/dL  TROPONIN I     Status: None   Collection Time    01/31/13  7:10 PM      Result Value Range   Troponin I <0.30  <0.30 ng/mL  GLUCOSE, CAPILLARY     Status: Abnormal   Collection Time    01/31/13  8:45 PM      Result Value Range   Glucose-Capillary 141 (*) 70 - 99 mg/dL   Comment 1 Notify RN    TROPONIN I     Status: None   Collection Time    02/01/13 12:12 AM      Result Value Range   Troponin I <0.30  <0.30 ng/mL  GLUCOSE, CAPILLARY     Status: Abnormal   Collection Time    02/01/13  8:08 AM      Result Value  Range   Glucose-Capillary 142 (*) 70 - 99 mg/dL   Comment 1 Documented in Chart     Comment 2 Notify RN      Intake/Output Summary (Last 24 hours) at 02/01/13 0838 Last data filed at 02/01/13 0459  Gross per 24 hour  Intake    240 ml  Output    800 ml  Net   -560 ml    ASSESSMENT AND PLAN:  CAD:  Results as noted on the cath.  Medical management.  Discussed again today with the patient and his family.    CVA:  Symptoms fluctuated yesterday and responded to lying flat. He and his family reportedly somewhat better today but improved grip, memory and speech. Plan and followup per neurology.  Rollene Rotunda 02/01/2013 8:38 AM

## 2013-02-01 NOTE — Discharge Summary (Signed)
Physician Discharge Summary  Oluwademilade Mckiver Rojek ZOX:[id number] DOB: 07/17/1933 DOA: 01/29/2013  PCP: [name], MD  Admit date: 01/29/2013 Discharge date: 02/01/2013  Time spent: Greater than 30 minutes  Recommendations for Outpatient Follow-up:  1. Dr. Lynnea Ferrier, PCP in 2 weeks. 2. Dr. Delia Heady, Neurology in 2 months. 3. Dr. [name], Cardiology 4. Dr. Lina Sar, GI 5. Patient is not supposed to drive until cleared by M.D. 6. Check CBGs periodically. Diabetes well controlled on diet alone.  Discharge Diagnoses:  Principal Problem:   Chest pain Active Problems:   Coronary artery disease   Dyslipidemia   Hypertension   CVA (cerebral infarction)   Discharge Condition: Improved & Stable  Diet recommendation: Heart healthy and diabetic diet.  Filed Weights   01/30/13 0115  Weight: 100.789 kg (222 lb 3.2 oz)    History of present illness:  79 year old male with history of CAD, dyslipidemia, hypothyroidism, NSTEMI 2004 presented to the ED with left-sided chest pain. EKG showed nonspecific changes and cardiac enzymes remain negative. Cardiology was consulted and due to concern for unstable angina, patient underwent cardiac cath on 11/11 2014 which showed moderate diffuse disease. D1 disease looked like it could potentially cause exertional chest pain but according to cardiology intervention would be difficult given the disease in the LAD at the one takeoff. Cardiology recommended medical management and added Imdur with plans to revisit intervention on D1 if he has further chest pain. Post cath, patient developed CVA with associated right-sided numbness, weakness and slurred speech. Neurology was consulted.  Hospital Course:   Unstable angina/history of NSTEMI/coronary artery disease  - Status post cardiac cath on 01/30/13 and results as below.  - Medical management as per cardiology and outpatient followup.   Acute CVA-bilateral anterior and posterior  infarcts, largest being in the left thalamus  - Infarcts felt to be embolic secondary to post cath complications.  - Neurology consultation and stroke team followup appreciated.  - Continue clopidogrel for secondary stroke prevention. Continue Lipitor.  - No driving at time of discharge due to field cut.  - Followup with Dr. Pearlean Brownie in 2 months.  - Patient had some waxing and waning symptoms of dysarthria and right-sided weakness over the last 24 hours which improved after patient was laid supine and given some IV fluids. Stroke team has seen today and discussed with stroke team who have cleared patient for discharge to inpatient rehabilitation.  Hypothyroidism  - Continue Synthroid.   Dyslipidemia  - Controlled. Continue statins.   Hypertension  - Controlled.   Type II DM  - Hemoglobin A1c 6.5.  - Metformin at discharge. Continue SSI   Consultations:  Cardiology  Neurology  Rehabilitation M.D.  Procedures: Cardiac Cath 11/11 Final Conclusions: Patent RCA stent. There was 40-50% proximal LAD stenosis at D1 with 70-80% ostial D1 stenosis. D1 disease looks like it could potentially cause exertional chest pain, but would be surprised if this could have caused the 2 hours of chest pain at rest he had yesterday. I reviewed with Dr. [place]: intervention would be difficult given the disease in the LAD at D1 take-off, could potentially perform balloon angioplasty. Will plan on medical management. Will add Imdur to regimen. If he has further chest pain, could re-visit intervention on D1.      Discharge Exam:  Complaints:  Some worsening slurred speech and right-sided weakness yesterday which started to improve by last night. This morning speech back to normal. Right sided strength is improved but has some discoordination.  Filed Vitals:   02/01/13 1007 02/01/13 1200 02/01/13 1223 02/01/13 1559  BP: 179/86 113/62 123/60 114/64  Pulse: 90 64 84 69  Temp:  97.1 F (36.2 C)  97.9 F  (36.6 C)  TempSrc:  Oral  Oral  Resp:  18  18  Height:      Weight:      SpO2:  97%  95%    General exam: Moderately built and nourished male sitting at edge of bed in no obvious distress.  Respiratory system: Clear. No increased work of breathing.  Cardiovascular system: S1 & S2 heard, RRR. No JVD, murmurs, gallops, clicks or pedal edema. Telemetry: Sinus rhythm Gastrointestinal system: Abdomen is nondistended, soft and nontender. Normal bowel sounds heard.  Central nervous system: Alert and oriented. Right diminished nasolabial fold. Mild dysarthria.  Extremities: 5 x 5 power left limbs. 4 x 5 power right upper extremity and 4+/5 power right lower extremity   Discharge Instructions      Discharge Orders   Future Appointments Provider Department Dept Phone   02/02/2013 10:00 AM Denzil Hughes, PT [facility] CENTER A [phone]   02/02/2013 11:00 AM Melonie Florida, OT [facility] A [phone]   02/02/2013 1:30 PM Huston Foley, CCC-SLP [facility] [phone] [facility] CENTER A [phone]   02/02/2013 2:30 PM Denzil Hughes, PT [facility] CENTER A [phone]   03/12/2013 8:15 AM Donita Brooks, MD [facility] [phone]   Future Orders Complete By Expires   Call MD for:  severe uncontrolled pain  As directed    Call MD for:  As directed    Comments:     Worsening stroke like symptoms.   Diet - low sodium heart healthy  As directed    Diet Carb Modified  As directed    Increase activity slowly  As directed        Medication List         atorvastatin 40 MG tablet  Commonly known as:  LIPITOR  Take 2 tablets (80 mg total) by mouth daily.     carbamazepine 300 MG 12 hr capsule  Commonly known as:  CARBATROL  Take 300 mg by mouth 2 (two) times daily.     cloNIDine 0.2 MG tablet  Commonly known as:  CATAPRES  Take 0.2 mg by mouth daily.      clopidogrel 75 MG tablet  Commonly known as:  PLAVIX  Take 1 tablet (75 mg total) by mouth daily.     diazepam 2 MG tablet  Commonly known as:  VALIUM  Take 2 mg by mouth 2 (two) times daily.     fluticasone 50 MCG/ACT nasal spray  Commonly known as:  FLONASE  Place 1 spray into the nose daily as needed for allergies.     hydrochlorothiazide 25 MG tablet  Commonly known as:  HYDRODIURIL  Take 1 tablet (25 mg total) by mouth daily.     isosorbide mononitrate 30 MG 24 hr tablet  Commonly known as:  IMDUR  Take 1 tablet (30 mg total) by mouth daily.     levothyroxine 25 MCG tablet  Commonly known as:  SYNTHROID, LEVOTHROID  Take 50 mcg by mouth daily before breakfast.     losartan 50 MG tablet  Commonly known as:  COZAAR  Take 1 tablet (50 mg total) by mouth 2 (two) times daily.  metFORMIN 500 MG tablet  Commonly known as:  GLUCOPHAGE  Take 500 mg by mouth 2 (two) times daily with a meal.     metoprolol 50 MG tablet  Commonly known as:  LOPRESSOR  Take 50 mg by mouth 2 (two) times daily.     multivitamins ther. w/minerals Tabs tablet  Take 1 tablet by mouth at bedtime.     nitroGLYCERIN 0.4 MG SL tablet  Commonly known as:  NITROSTAT  Place 1 tablet (0.4 mg total) under the tongue every 5 (five) minutes as needed for chest pain.     pantoprazole 40 MG tablet  Commonly known as:  PROTONIX  Take 1 tablet (40 mg total) by mouth daily.     polyethylene glycol packet  Commonly known as:  MIRALAX / GLYCOLAX  Take 17 g by mouth daily.     senna 8.6 MG Tabs tablet  Commonly known as:  SENOKOT  Take 2 tablets (17.2 mg total) by mouth daily.     silodosin 8 MG Caps capsule  Commonly known as:  RAPAFLO  Take 8 mg by mouth daily with breakfast.       Follow-up Information   Follow up with Lina Sar, MD. Schedule an appointment as soon as possible for a visit on . (Missed appointment for 02/01/2013, because was hospitalized.)    Specialty:  Gastroenterology    Contact information:   [address] [place] [address] [phone]       Follow up with [facility] TOM, MD. Schedule an appointment as soon as possible for a visit in 2 weeks.   Specialty:  Family Medicine   Contact information:   [address] [place] [address] [phone]       Follow up with Gates Rigg, MD. Schedule an appointment as soon as possible for a visit in 2 months.   Specialties:  Neurology, Radiology   Contact information:   [address] [place] [address] [phone]       Schedule an appointment as soon as possible for a visit with [name], MD.   Specialty:  Cardiology   Contact information:   [address] [place] [address] [phone]        The results of significant diagnostics from this hospitalization (including imaging, microbiology, ancillary and laboratory) are listed below for reference.    Significant Diagnostic Studies: Dg Chest 2 View  01/29/2013   CLINICAL DATA:  Chest pain.  EXAM: CHEST  2 VIEW  COMPARISON:  December 03, 2012.  FINDINGS: The heart size and mediastinal contours are within normal limits. Both lungs are clear. No pleural effusion or pneumothorax is noted. Mild anterior osteophyte formation is seen involving mid thoracic spine.  IMPRESSION: No active cardiopulmonary disease.   Electronically Signed   By: Roque Lias M.D.   On: 01/29/2013 21:43   Mr Maxine Glenn Head Wo Contrast  01/31/2013   CLINICAL DATA:  Multiple infarctions  EXAM: MRA HEAD WITHOUT CONTRAST  TECHNIQUE: Angiographic images of the Circle of Willis were obtained using MRA technique without intravenous contrast.  COMPARISON:  01/30/2013  FINDINGS: Both internal carotid arteries are widely patent into the brain. No siphon stenosis. The anterior and middle cerebral vessels are patent without proximal stenosis, aneurysm or vascular malformation.  Both vertebral arteries are patent with the right  being larger than the left. There is some atherosclerotic irregularity, particularly of the right vertebral artery. No flow-limiting stenosis.  The basilar  artery shows slight atherosclerotic irregularity but no stenosis.  The left posterior cerebral artery is occluded 1.5 cm beyond its origin. There is severe stenosis of the right PCA 1 cm beyond its origin but the vessel does show persistent flow distally.  IMPRESSION: Occlusion of the left posterior cerebral artery 1.5 cm beyond its origin. Severe stenosis of the right PCA 1 cm beyond its origin. Atherosclerotic irregularity of the dominant right vertebral artery without of visible flow-limiting stenosis .   Electronically Signed   By: Paulina Fusi M.D.   On: 01/31/2013 09:04   [name]  01/30/2013   CLINICAL DATA:  Perioral numbness following cardiac catheterization. Slurred speech.  EXAM: MRI HEAD WITHOUT CONTRAST  TECHNIQUE: Multiplanar, multiecho pulse sequences of the brain and surrounding structures were obtained without intravenous contrast.  COMPARISON:  12/04/2012 MRI from [facility].  FINDINGS: Multiple subcentimeter areas of restricted diffusion representing acute infarction are seen throughout both hemispheres, most notably the left thalamus, without hemorrhage. These are new from the prior MRI in September and represent what are likely multiple emboli.  No mass lesion, hydrocephalus, or extra-axial fluid.  Moderate cerebral and cerebellar atrophy. Mild subcortical and periventricular T2 and FLAIR hyperintensities, likely chronic microvascular ischemic change. Flow voids are maintained throughout the carotid, basilar, and vertebral arteries. There are no areas of chronic hemorrhage. Pituitary, pineal, and cerebellar tonsils unremarkable. No upper cervical lesions. Visualized calvarium, skull base, and upper cervical osseous structures unremarkable. Scalp, orbits, sinuses, and mastoids show no acute process.  Incidental note  is made of a superficial lobe left parotid ovoid lesion 5 x 10 mm cross-section probably a lymph node, stable from September.  IMPRESSION: Multiple subcentimeter areas of restricted diffusion without hemorrhage representing acute infarction likely related to a shower of emboli.  These results will be called to the ordering clinician or representative by the Radiologist Assistant, and communication documented in the PACS Dashboard.   Electronically Signed   By: Davonna Belling M.D.   On: 01/30/2013 19:04    Microbiology: Recent Results (from the past 240 hour(s))  MRSA PCR SCREENING     Status: None   Collection Time    01/30/13  1:31 AM      Result Value Range Status   MRSA by PCR NEGATIVE  NEGATIVE Final   Comment:            The GeneXpert MRSA Assay (FDA     approved for NASAL specimens     only), is one component of a     comprehensive MRSA colonization     surveillance program. It is not     intended to diagnose MRSA     infection nor to guide or     monitor treatment for     MRSA infections.     Labs: Basic Metabolic Panel:  Recent Labs Lab 01/29/13 2132 01/30/13 1158  NA 136  --   K 3.7  --   CL 96  --   CO2 28  --   GLUCOSE 100*  --   BUN 18  --   CREATININE 0.96 0.81  CALCIUM 10.3  --    Liver Function Tests: No results found for this basename: AST, ALT, ALKPHOS, BILITOT, PROT, ALBUMIN,  in the last 168 hours No results found for this basename: LIPASE, AMYLASE,  in the last 168 hours No results found for this basename: AMMONIA,  in the last 168 hours CBC:  Recent Labs Lab 01/29/13 2132 01/30/13  1158  WBC 13.8* 8.7  HGB 14.7 13.2  HCT 42.4 38.6*  MCV 90.4 90.8  PLT 287 264   Cardiac Enzymes:  Recent Labs Lab 01/30/13 2315 01/31/13 0449 01/31/13 1154 01/31/13 1910 02/01/13 0012  TROPONINI <0.30 <0.30 <0.30 <0.30 <0.30   BNP: BNP (last 3 results) No results found for this basename: PROBNP,  in the last 8760 hours CBG:  Recent Labs Lab  01/31/13 1149 01/31/13 1644 01/31/13 2045 02/01/13 0808 02/01/13 1145  GLUCAP 145* 129* 141* 142* 124*    Additional labs:  1. Fasting lipids: Cholesterol 147, triglycerides 86, HDL 54, LDL 76, VLDL 17 2. Hemoglobin A1c: 6.5 3. TSH: 4.465 4. 2-D echo in 01/31/13: Study Conclusions  - Left ventricle: The cavity size was normal. There was mild focal basal and mild concentric hypertrophy of the septum. Systolic function was normal. The estimated ejection fraction was in the range of 50% to 55%. Wall motion was normal; there were no regional wall motion abnormalities. Doppler parameters are consistent with abnormal left ventricular relaxation (grade 1 diastolic dysfunction). - Aortic valve: Calcified annulus. Mildly thickened leaflets. Trivial regurgitation. - Mitral valve: Calcified annulus. Mildly thickened leaflets. - Left atrium: The atrium was mildly dilated. - Right ventricle: The cavity size was mildly dilated. - Atrial septum: No defect or patent foramen ovale was identified. - Pulmonary arteries: PA peak pressure: 35mm Hg (S). 5. Carotid Doppler: Summary:  - The vertebral arteries appear patent with antegrade flow. - Findings consistent with 1- 39 percent stenosis involving the right internal carotid artery. - Findings consistent with 60 - 79 percent stenosis, low end of scale,involving the left internal carotid artery. - Due to calcific plaque with acoustic shadowing, higher grade stenosis may be obscured bilaterally   Signed:  Marcellus Scott, MD, FACP, FHM. [facility] Pager [phone]  If 7PM-7AM, please contact night-coverage [email or web address] Password [email or web address] 02/01/2013, 4:21 PM

## 2013-02-02 ENCOUNTER — Inpatient Hospital Stay (HOSPITAL_COMMUNITY): Payer: Medicare Other

## 2013-02-02 ENCOUNTER — Inpatient Hospital Stay (HOSPITAL_COMMUNITY): Payer: Medicare Other | Admitting: Speech Pathology

## 2013-02-02 ENCOUNTER — Inpatient Hospital Stay (HOSPITAL_COMMUNITY): Payer: Medicare Other | Admitting: Occupational Therapy

## 2013-02-02 ENCOUNTER — Encounter (HOSPITAL_COMMUNITY): Payer: Self-pay | Admitting: Radiology

## 2013-02-02 DIAGNOSIS — S0990XA Unspecified injury of head, initial encounter: Secondary | ICD-10-CM | POA: Diagnosis not present

## 2013-02-02 DIAGNOSIS — IMO0001 Reserved for inherently not codable concepts without codable children: Secondary | ICD-10-CM

## 2013-02-02 DIAGNOSIS — R569 Unspecified convulsions: Secondary | ICD-10-CM

## 2013-02-02 DIAGNOSIS — I6340 Cerebral infarction due to embolism of unspecified cerebral artery: Secondary | ICD-10-CM

## 2013-02-02 DIAGNOSIS — E1165 Type 2 diabetes mellitus with hyperglycemia: Secondary | ICD-10-CM

## 2013-02-02 DIAGNOSIS — E1142 Type 2 diabetes mellitus with diabetic polyneuropathy: Secondary | ICD-10-CM

## 2013-02-02 LAB — GLUCOSE, CAPILLARY
Glucose-Capillary: 139 mg/dL — ABNORMAL HIGH (ref 70–99)
Glucose-Capillary: 123 mg/dL — ABNORMAL HIGH (ref 70–99)
Glucose-Capillary: 160 mg/dL — ABNORMAL HIGH (ref 70–99)
Glucose-Capillary: 134 mg/dL — ABNORMAL HIGH (ref 70–99)

## 2013-02-02 LAB — CBC WITH DIFFERENTIAL/PLATELET
Basophils Absolute: 0 10*3/uL (ref 0.0–0.1)
Basophils Relative: 1 % (ref 0–1)
Eosinophils Absolute: 0.5 10*3/uL (ref 0.0–0.7)
Eosinophils Relative: 6 % — ABNORMAL HIGH (ref 0–5)
HCT: 37.9 % — ABNORMAL LOW (ref 39.0–52.0)
Hemoglobin: 12.6 g/dL — ABNORMAL LOW (ref 13.0–17.0)
Lymphocytes Relative: 24 % (ref 12–46)
Lymphs Abs: 2.1 10*3/uL (ref 0.7–4.0)
MCH: 30.1 pg (ref 26.0–34.0)
MCHC: 33.2 g/dL (ref 30.0–36.0)
MCV: 90.7 fL (ref 78.0–100.0)
Monocytes Absolute: 1 10*3/uL (ref 0.1–1.0)
Monocytes Relative: 11 % (ref 3–12)
Neutro Abs: 5.3 10*3/uL (ref 1.7–7.7)
Neutrophils Relative %: 59 % (ref 43–77)
Platelets: 239 10*3/uL (ref 150–400)
RBC: 4.18 MIL/uL — ABNORMAL LOW (ref 4.22–5.81)
RDW: 14.1 % (ref 11.5–15.5)
WBC: 8.8 10*3/uL (ref 4.0–10.5)

## 2013-02-02 LAB — COMPREHENSIVE METABOLIC PANEL
ALT: 25 U/L (ref 0–53)
AST: 28 U/L (ref 0–37)
Albumin: 3.4 g/dL — ABNORMAL LOW (ref 3.5–5.2)
Alkaline Phosphatase: 75 U/L (ref 39–117)
BUN: 11 mg/dL (ref 6–23)
CO2: 29 mEq/L (ref 19–32)
Calcium: 8.7 mg/dL (ref 8.4–10.5)
Chloride: 99 mEq/L (ref 96–112)
Creatinine, Ser: 0.88 mg/dL (ref 0.50–1.35)
GFR calc Af Amer: >90 mL/min (ref >90)
GFR calc non Af Amer: 81 mL/min — ABNORMAL LOW (ref >90)
Glucose, Bld: 128 mg/dL — ABNORMAL HIGH (ref 70–99)
Potassium: 3.8 mEq/L (ref 3.5–5.1)
Sodium: 137 mEq/L (ref 135–145)
Total Bilirubin: 0.3 mg/dL (ref 0.3–1.2)
Total Protein: 6.4 g/dL (ref 6.0–8.3)

## 2013-02-02 NOTE — Evaluation (Signed)
Physical Therapy Assessment and Plan  Patient Details  Name: [name] MRN: [id number] Date of Birth: 1934/01/14  PT Diagnosis: Decreased postural control, Abnormality of gait, Coordination disorder, Difficulty walking, Impaired cognition, Impaired sensation and Muscle weakness Rehab Potential:Good ELOS: 10-14 days   Today's Date: 02/02/2013 Time: Treatment Session 1: 1000-1200; Treatment Session 2: 1430-1500 Time Calculation (min): Treatment Session 1: 60 min; Treatment Session 2:  Problem List:  Patient Active Problem List   Diagnosis Date Noted  . Embolic cerebral infarction 02/02/2013  . CVA (cerebral infarction) 01/31/2013  . Chest pain 01/29/2013  . Stenosis of left internal carotid artery   . Loss of coordination 12/03/2012  . Obesity 08/20/2011  . Coronary artery disease   . Dyslipidemia   . Hypertension     Past Medical History:  Past Medical History  Diagnosis Date  . Coronary artery disease     s/p MI tx with Cypher DES to pRCA in 4/04; LHC 4/04 in the setting of his MI: Proximal LAD 30%, ostial diagonal 40-50%, ostial OM1 30%, proximal RCA 95% which was the infarct vessel   ;    Echocardiogram 7/09: Normal LV function.  Nuclear study  March 2013 negative for ischemia  . Dyslipidemia   . Seizure disorder   . Hypothyroidism   . DDD (degenerative disc disease)   . Hx MRSA infection     left buttocks abscess  . Rotator cuff injury     chronic rotator cuff injury status post repair  . Hypertension   . Syncope   . Dizziness   . Meniere's disease     Status post shunt  . BPH (benign prostatic hyperplasia)   . Stenosis of left internal carotid artery     40-59% on carotid doppler 2014  . Diabetes mellitus without complication   . Hyperlipidemia    Past Surgical History:  Past Surgical History  Procedure Laterality Date  . Coronary angioplasty with stent placement  07/14/2002    Stent to the right coronary artery  . Rotator cuff repair      for  chronic rotator cuff injury  . Hernia repair  04/10/2008    scrotal hernia repair  . Endolymphatic shunt decompression  06/11/2009     Right endolymphatic sac decompression and shunt  placement    Assessment & Plan Clinical Impression: [name] is a 79 y.o. right-handed male with history of CAD/MI with stenting her last cardiac catheterization 2004, hypertension and seizure disorder. Admitted 01/29/2013 with left-sided chest pain while raking leaves. He did receive nitroglycerin in the ED with some relief. EKG no significant ST segment changes. Troponin negative. Underwent cardiac catheterization 01/30/2013 per Dr.McLean that showed 40-50% proximal LAD stenosis and advised medical management. Noted post catheterization with right facial numbness as well as slurred speech. MRI shows bilateral anterior posterior infarcts. MRA confirms findings left PCA occlusion. Echocardiogram 02/01/2003 [name] he 55% grade 1 diastolic dysfunction. Carotid Dopplers with left 60-79% ICA stenosis. Patient did not receive TPA. Maintained on Plavix therapy for CVA prophylaxis as well as cardiac history as well as the addition of subcutaneous heparin for DVT prophylaxis. Occupational therapy evaluation completed 01/31/2013 recommendations of physical medicine rehabilitation consult. Patient was felt to be a good candidate for inpatientrehabilitation services and was admitted for a comprehensive rehabilitation program after consultation of the rehab team. Patient transferred to CIR on 02/01/2013 .   Patient currently requires mod with mobility secondary to muscle weakness, impaired timing and sequencing, decreased coordination and decreased  motor planning, decreased attention to right and decreased attention, decreased awareness, decreased problem solving, decreased safety awareness and decreased memory.  Prior to hospitalization, patient was independent  with mobility and lived with Spouse [name]) in a House home.  Home  access is 2Stairs to enter.  Patient will benefit from skilled PT intervention to maximize safe functional mobility, minimize fall risk and decrease caregiver burden for planned discharge home with 24 hour supervision/assist.  Anticipate patient will HHPT vs. OPPT TBD at discharge.  PT - End of Session Activity Tolerance: Tolerates 30+ min activity with multiple rests Endurance Deficit: Yes PT Assessment Rehab Potential: Good PT Patient demonstrates impairments in the following area(s): Balance;Endurance;Motor;Perception;Safety PT Transfers Functional Problem(s): Bed Mobility;Bed to Chair;Car;Furniture PT Locomotion Functional Problem(s): Stairs;Wheelchair Mobility;Ambulation PT Plan PT Intensity: Minimum of 1-2 x/day ,45 to 90 minutes PT Frequency: 5 out of 7 days PT Duration Estimated Length of Stay: 10-14 days PT Treatment/Interventions: Ambulation/gait training;Balance/vestibular training;Discharge planning;Disease management/prevention;Cognitive remediation/compensation;Functional mobility training;Patient/family education;Therapeutic Exercise;Therapeutic Activities;UE/LE Coordination activities;UE/LE Strength taining/ROM;Wheelchair propulsion/positioning;Visual/perceptual remediation/compensation;Stair training;Neuromuscular Engineer, materials PT Transfers Anticipated Outcome(s): Supervision to [employer] A PT Locomotion Anticipated Outcome(s): Supervision to [employer] A PT Recommendation Follow Up Recommendations: Home health PT;Outpatient PT;24 hour supervision/assistance Patient destination: Home Equipment Recommended: Rolling walker with 5" wheels;Wheelchair cushion (measurements);Wheelchair (measurements);To be determined  Skilled Therapeutic Intervention Treatment Session 1:  1:1. Pt received semi-reclined in bed, alert and ready for therapy. PT evaluation completed, see detailed objective information below. Tx initiated w/ focus on functional transfers, w/c  propulsion and management of parts as well as gait training. Pt req consistent verbal cues to attend to R UE/LE during all transfers and mobility. Practiced multiple t/fs sit<>stand from w/c w/ RW, emphasis on pushing up and reaching back w/ L UE and keeping R UE on RW to maintain in visual field due to poor proprioception and sensation. Pt able to propel w/c w/ L UE and B LE, cues to attend to position of R LE. Pt able to manage brakes at (S) level and min cueing through remainder of tx session after initial demonstration. Following amb w/ RW, trial amb w/ single rail w/ L UE and R HHA- min-mod A, increased ability to maintain straight path, maintain BOS and decreased scissoring noted. Brief trial of HHA only, max A overall due to proprioceptive and coordination deficits. Pt req mod verbal/tactile cues to maintain selective attention to therapeutic task during w/c propulsion and gait training in busy hallway/gym environments. Pt's wife and daughter present during initial part of evaluation, attempting to over-help pt w/ functional tasks/challenges. Educated pt and family on importance of letting pt try to do things for himself to promote recovery. Pt's daughter verbalized understanding, however, wife appeared to be overwhelmed. Wife w/ continued attempts to help pt despite re-education. Daughter questioning if stair railings should be put in, therapist educated regarding benefit for increased safety and stability during stair negotiation. Pt sitting in w/c at end of session w/ quick release belt in place, all needs w/in reach and family in room.   Treatment Session 2:  1:1. Pt received sitting in w/c, just finished tx session w/ SLP and family in room. Focus this session on w/c propulsion and sit<>stand transfers. Pt demonstrated improved ability to propel w/c this session w/ decreased need for assistance using L UE/LE only. Pt able to propel w/c x175' w/ supervision-intermittent min A. In therapy gym, attempted  t/f sit<>stand w/ RW. Pt very impulsive, despite cues for safety, seq and attention to R LE  unable to complete safe t/f sit>stand, req assist back to seated position. Transition to // bars for increased control. Emphasis on attending to R UE/LE, even weight bearing and maintaining midline during t/f sit<>stand and mini-squats. Pt demonstrated increased control and quality of transfer after practicing multiple times. Pt sitting in w/c at end of session w/ quick release belt in place, all needs w/in reach and family in room.   Following last tx session, daughter reaching out to therapist to discuss pt's progress today. Daughter surprised by level of increased need for assistance in PM vs. AM. Education regarding effects of physical and cognitive fatigue as well as daily demand by inpatient rehab vs. Acute care. Daughter all expressed concern regarding pt's fall last night. Education regarding safety protocol listed in pt's room regarding therapists' recommendations for increased pt safety. Daughter verbalized understanding of all.   PT Evaluation Precautions/Restrictions Precautions Precautions: Fall Precaution Comments: right sided weakness, decreased fine and gross motor coordination, decreased sensation, decreased proprioception, occasional ballistic movements with RUE Restrictions Weight Bearing Restrictions: No General Chart Reviewed: Yes Vital SignsTherapy Vitals Temp: 98.3 F (36.8 C) Pulse Rate: 89 BP: 151/73 mmHg Patient Position, if appropriate: Lying Oxygen Therapy SpO2: 97 % O2 Device: None (Room air) Pain Pain Assessment Pain Assessment: No/denies pain Pain Score: 0-No pain Home Living/Prior Functioning Home Living Available Help at Discharge: Family;Available 24 hours/day Type of Home: House Home Access: Stairs to enter [employer] of Steps: 2 Entrance Stairs-Rails: None Home Layout: One level Additional Comments: Family educated regarding safety benefit of  rail placement on steps and grab bars in bathroom, patient has walk in shower with door  Lives With: Spouse [name]) Prior Function Level of Independence: Independent with basic ADLs;Independent with homemaking with ambulation;Independent with gait;Independent with transfers  Able to Take Stairs?: Yes Driving: Yes 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 Vision/Perception  Vision - History Baseline Vision: No visual deficits Vision - Assessment Eye Alignment: Within Functional Limits Perception Perception: Impaired Inattention/Neglect: Does not attend to right side of body;Does not attend to right visual field Praxis Praxis: Impaired Praxis Impairment Details: Motor planning  Cognition Overall Cognitive Status: Within Functional Limits for tasks assessed Arousal/Alertness: Awake/alert Orientation Level: Oriented X4 Attention: Sustained;Selective Sustained Attention: Appears intact Selective Attention: Impaired Selective Attention Impairment: Verbal basic;Functional basic Memory: Impaired Memory Impairment: Decreased recall of new information;Decreased short term memory Decreased Short Term Memory: Functional basic;Verbal basic Awareness: Impaired Awareness Impairment: Emergent impairment Problem Solving: Impaired Problem Solving Impairment: Verbal basic;Functional basic Executive Function: Sequencing;Organizing;Self Monitoring;Self Correcting Sequencing: Impaired Sequencing Impairment: Functional basic Organizing: Impaired Organizing Impairment: Functional basic Self Monitoring: Impaired Self Monitoring Impairment: Functional basic Self Correcting: Impaired Self Correcting Impairment: Functional basic Safety/Judgment: Impaired Comments: Pt w/ hx of fall last night, stated that he was trying to get up and sit on the edge of the bed Sensation Sensation Light Touch: Impaired Detail Light Touch Impaired Details: Impaired RUE;Impaired  RLE Proprioception: Impaired Detail Proprioception Impaired Details: Impaired RUE;Impaired RLE Additional Comments: R UE impaired more than R LE, pt notes N/T in R fingers only. Bilaterall, light touch on R side dull compared to L w/ decreased sensation more distally vs. proximally Coordination Gross Motor Movements are Fluid and Coordinated: No Fine Motor Movements are Fluid and Coordinated: No Heel Shin Test: decreased speed and accuracy of R LE compared to L LE Motor  Motor Motor: Other (comment) Motor - Skilled Clinical Observations: Ballistic movements of R UE  Mobility Bed Mobility Bed Mobility: Supine to Sit;Sitting -  Scoot to Edge of Bed Supine to Sit: 4: Min assist;HOB flat;Other (comment) (w/out rails) Supine to Sit Details: Verbal cues for precautions/safety Sitting - Scoot to Edge of Bed: 4: Min guard Transfers Transfers: Yes Sit to Stand: 4: Min assist;With armrests;From bed;From chair/3-in-1 Sit to Stand Details: Tactile cues for sequencing;Verbal cues for sequencing;Verbal cues for technique;Tactile cues for placement;Verbal cues for safe use of DME/AE;Verbal cues for precautions/safety Sit to Stand Details (indicate cue type and reason): cues to attend to position of R UE/LE Stand to Sit: 4: Min assist Stand to Sit Details (indicate cue type and reason): Tactile cues for sequencing;Tactile cues for placement;Verbal cues for sequencing;Verbal cues for precautions/safety;Verbal cues for technique;Verbal cues for safe use of DME/AE Stand to Sit Details: Cues to attend to position of R UE/LE Stand Pivot Transfers: 3: Mod assist Stand Pivot Transfer Details: Tactile cues for sequencing;Tactile cues for placement;Verbal cues for sequencing;Verbal cues for technique;Verbal cues for precautions/safety;Verbal cues for safe use of DME/AE Locomotion  Ambulation Ambulation: Yes Ambulation/Gait Assistance: 3: Mod assist Ambulation Distance (Feet): 175 Feet Assistive device:  Rolling walker Ambulation/Gait Assistance Details: Verbal cues for safe use of DME/AE;Tactile cues for sequencing;Tactile cues for placement;Tactile cues for weight shifting;Verbal cues for precautions/safety;Verbal cues for technique;Verbal cues for sequencing;Verbal cues for gait pattern Ambulation/Gait Assistance Details: Therapist assisting w/ maintaining R UE on RW, very poor selective attention to actiivty when in busy hallway/gym environments. Unable to maintain straight path, veering to R side.  Gait Gait: Yes Gait Pattern: Step-to pattern;Narrow base of support;Lateral trunk lean to right;Decreased hip/knee flexion - left;Decreased hip/knee flexion - right;Scissoring Gait velocity: decreased Stairs / Additional Locomotion Stairs: Yes Stairs Assistance: 4: Min assist Stairs Assistance Details: Tactile cues for sequencing;Tactile cues for placement;Verbal cues for technique;Verbal cues for sequencing;Verbal cues for precautions/safety Stair Management Technique: Two rails;Step to pattern Number of Stairs: 10 Wheelchair Mobility Wheelchair Mobility: Yes Wheelchair Assistance: 4: Min Education officer, museum: Left upper extremity;Both lower extermities Wheelchair Parts Management: Needs assistance Distance: 175'x2  Trunk/Postural Assessment  Cervical Assessment Cervical Assessment: Within Functional Limits Thoracic Assessment Thoracic Assessment: Exceptions to [facility] (Kyphotic posture) Lumbar Assessment Lumbar Assessment: Within Functional Limits Postural Control Postural Control: Deficits on evaluation Righting Reactions: mild delay in sitting, significant delay in standing  Balance Balance Balance Assessed: Yes Static Sitting Balance Static Sitting - Balance Support: Bilateral upper extremity supported;Feet supported Static Sitting - Level of Assistance: 5: Stand by assistance Dynamic Sitting Balance Dynamic Sitting - Balance Support: Left upper extremity supported;Right  upper extremity supported;Feet supported Dynamic Sitting - Level of Assistance: 4: Min assist Static Standing Balance Static Standing - Balance Support: Bilateral upper extremity supported;No upper extremity supported;During functional activity Static Standing - Level of Assistance: 4: Min assist Dynamic Standing Balance Dynamic Standing - Balance Support: No upper extremity supported;During functional activity Dynamic Standing - Level of Assistance: 3: Mod assist;4: Min assist Dynamic Standing - Balance Activities: Forward lean/weight shifting;Lateral lean/weight shifting Dynamic Standing - Comments: attempting to pull up pants Extremity Assessment      RLE Assessment RLE Assessment: Within Functional Limits (4/5 hip flex; 4+/5 knee flex/ext, PF/DF) RLE Strength RLE Overall Strength Comments: Decreased functional endurance LLE Assessment LLE Assessment: Within Functional Limits (4/5 hip flex; 4+/5 knee flex/ext, PF/DF) LLE Strength LLE Overall Strength Comments: decreased funcitonal endurance  FIM:  FIM - Banker Devices: Manufacturing systems engineer Transfer: 4: Supine > Sit: Min A (steadying Pt. > 75%/lift 1 leg);3: Chair or W/C > Bed: Mod A (lift  or lower assist);3: Bed > Chair or W/C: Mod A (lift or lower assist) FIM - Locomotion: Wheelchair Distance: 175'x2 Locomotion: Wheelchair: 4: Travels 150 ft or more: maneuvers on rugs and over door sillls with minimal assistance (Pt.>75%) FIM - Locomotion: Ambulation Locomotion: Ambulation Assistive Devices: Designer, industrial/product Ambulation/Gait Assistance: 3: Mod assist Locomotion: Ambulation: 3: Travels 150 ft or more with moderate assistance (Pt: 50 - 74%) FIM - Locomotion: Stairs Locomotion: Building control surveyor: Hand rail - 2 Locomotion: Stairs: 2: Up and Down 4 - 11 stairs with minimal assistance (Pt.>75%)   Refer to Care Plan for Long Term Goals  Recommendations for other services:  None  Discharge Criteria: Patient will be discharged from PT if patient refuses treatment 3 consecutive times without medical reason, if treatment goals not met, if there is a change in medical status, if patient makes no progress towards goals or if patient is discharged from hospital.  The above assessment, treatment plan, treatment alternatives and goals were discussed and mutually agreed upon: by patient and by family  Denzil Hughes 02/02/2013, 4:18 PM

## 2013-02-02 NOTE — Progress Notes (Signed)
Social Work Patient ID: [name], male   DOB: 05-29-1933, 79 y.o.   MRN: [id number]  CSW attempted to meet with pt to complete assessment, but shortly after we started meeting pt received a visitor.  This is pt's first day on Rehab and he is also busy with therapy evaluations.  CSW will follow up with pt again on 02-05-13.  Pt did admit to CSW that he is not feeling well (frustrated) and can't believe this is all happening to him.  He also feels that he is not making any progress.  CSW acknowledged his frustration and tried to explain that the therapists and doctor feel pt has an excellent rehab prognosis, when his visitor came to the room.   CSW will revisit this with pt on 02-05-13.

## 2013-02-02 NOTE — Progress Notes (Signed)
Subjective/Complaints: Left side of bed came apart (rails) when patient pushed on them to assist in sliding in bed. He fell to the floor. Fortunately no substantial injuries. Denies headache, new weakness, confusion, n/v, etc.  A 12 point review of systems has been performed and if not noted above is otherwise negative.   Objective: Vital Signs: Blood pressure 141/63, pulse 77, temperature 98.2 F (36.8 C), temperature source Oral, resp. rate 17, height 5\' 9"  (1.753 m), weight 99.338 kg (219 lb), SpO2 93.00%. Ct Head Wo Contrast  02/02/2013   CLINICAL DATA:  Larey Seat.  Recent stroke.  EXAM: CT HEAD WITHOUT CONTRAST  TECHNIQUE: Contiguous axial images were obtained from the base of the skull through the vertex without intravenous contrast.  COMPARISON:  12/03/2012 and brain MR dated 01/30/2013.  FINDINGS: Subacute left thalamic lacune or infarct. Stable enlarged ventricles and subarachnoid spaces. No intracranial hemorrhage, mass lesion or CT evidence of acute infarction. Unremarkable bones and included paranasal sinuses.  IMPRESSION: 1. No acute abnormality. 2. Left thalamic subacute infarct 3. Stable atrophy.   Electronically Signed   By: [name] M.D.   On: 02/02/2013 01:13    Recent Labs  02/01/13 1910 02/02/13 0600  WBC 9.1 8.8  HGB 12.7* 12.6*  HCT 37.7* 37.9*  PLT 259 239    Recent Labs  02/01/13 1910 02/02/13 0600  NA  --  137  K  --  3.8  CL  --  99  GLUCOSE  --  128*  BUN  --  11  CREATININE 0.88 0.88  CALCIUM  --  8.7   CBG (last 3)   Recent Labs  02/01/13 1725 02/01/13 2147 02/02/13 0714  GLUCAP 122* 145* 139*    Wt Readings from Last 3 Encounters:  02/01/13 99.338 kg (219 lb)  01/30/13 100.789 kg (222 lb 3.2 oz)  01/30/13 100.789 kg (222 lb 3.2 oz)    Physical Exam:  Constitutional: He is oriented to person, place, and time. He appears well-developed and well-nourished.  HENT:  Head: Normocephalic and atraumatic.  Right Ear: External ear normal.   Left Ear: External ear normal.  Eyes: EOM are normal. Right eye exhibits no discharge. Left eye exhibits no discharge.  Neck: Normal range of motion. Neck supple. No JVD present. No tracheal deviation present. No thyromegaly present.  Cardiovascular: Normal rate and regular rhythm.  Respiratory: Effort normal and breath sounds normal. No respiratory distress.  GI: Soft. Bowel sounds are normal. He exhibits no distension. There is no tenderness. There is no rebound.  Musculoskeletal: He exhibits no edema and no tenderness.  Lymphadenopathy:  He has no cervical adenopathy.  Neurological: He is alert and oriented to person, place, and time.  Mild right facial weakness. Decreased sensation around the mouth and tongue and to a lesser extent the right arm.  RUE 4/5. RLE 4 to 4+/5. Right upper and lower limb ataxia. Speech slurred but improved from yesterday. Reasonable insight and awareness.   STM functional.  Skin: Skin is warm and dry.  Psychiatric: He has a normal mood and affect. His behavior is normal. Judgment and thought content normal   Assessment/Plan: 1. Functional deficits secondary to embolic, left PCA/thalamic infarct which require 3+ hours per day of interdisciplinary therapy in a comprehensive inpatient rehab setting. Physiatrist is providing close team supervision and 24 hour management of active medical problems listed below. Physiatrist and rehab team continue to assess barriers to discharge/monitor patient progress toward functional and medical goals. FIM:  Comprehension Comprehension Mode: Auditory Comprehension: 5-Follows basic conversation/direction: With no assist  Expression Expression Mode: Verbal Expression: 5-Expresses basic needs/ideas: With extra time/assistive device  Social Interaction Social Interaction: 5-Interacts appropriately 90% of the time - Needs monitoring or encouragement for participation or interaction.  Problem  Solving Problem Solving: 5-Solves basic 90% of the time/requires cueing < 10% of the time  Memory Memory: 5-Recognizes or recalls 90% of the time/requires cueing < 10% of the time  Medical Problem List and Plan:  1. Embolic infarcts left PCA distribution, most prominently involving left thalamus after cardiac catheterization   -CT of head after fall last night negative for acute injury. Thalamic infarct stable 2. DVT Prophylaxis/Anticoagulation: Subcutaneous heparin. Monitor platelet counts and any signs of bleeding  3. Pain Management: Tylenol as needed. Monitor with increased mobility  4. Neuropsych: This patient is capable of making decisions on his own behalf.  5. CAD status post catheterization 01/30/2013. Followup cardiac services. Continue medical management with Plavix therapy  6. Seizure disorder. Tegretol 300 mg twice a day, Valium 2 mg twice a day.   7. Hypertension. Lopressor 50 mg twice a day, Cozaar 50 mg twice a day, Imdur 30 mg daily, hydrochlorothiazide 25 mg daily, clonidine 0.2 mg daily. Monitor with increased mobility  8. Diabetes mellitus with peripheral neuropathy. Hemoglobin A1c 6.5. Sliding scale insulin her present. Check CBGs a.c. and at bedtime. Patient on Glucophage 500 mg twice a day prior to admission and resume as tolerated.   -follow for pattern and adjust accordingly 9. Hypothyroidism. Synthroid  10. BPH. Flomax. Check PVRs x3 and voiding patterns  11. Hyperlipidemia. Lipitor  LOS (Days) 1 A FACE TO FACE EVALUATION WAS PERFORMED  [name] 02/02/2013 9:20 AM

## 2013-02-02 NOTE — Significant Event (Signed)
2345: Per patient: he stated he was trying to sit in bed by holding the side rail. The side rail broke and the patient failed. He hit his head to the base of the table and a bruised on the right leg. Pam love, PA. was made aware at 2359 and CT scan and neuro check was ordered. Patient is  Alerted and oriented,  and is doing well

## 2013-02-02 NOTE — Evaluation (Signed)
Occupational Therapy Assessment and Plan  Patient Details  Name: [name] MRN: [id number] Date of Birth: 10/27/1933  OT Diagnosis: abnormal posture, apraxia, ataxia, cognitive deficits, disturbance of vision, hemiplegia affecting dominant side and muscle weakness (generalized) Rehab Potential: Rehab Potential: Good ELOS: 10-14 days   Today's Date: 02/02/2013 Time: 1100-1200 and 345-410 Time Calculation (min): 60 min and 25 min  Problem List:  Patient Active Problem List   Diagnosis Date Noted  . Embolic cerebral infarction 02/02/2013  . CVA (cerebral infarction) 01/31/2013  . Chest pain 01/29/2013  . Stenosis of left internal carotid artery   . Loss of coordination 12/03/2012  . Obesity 08/20/2011  . Coronary artery disease   . Dyslipidemia   . Hypertension     Past Medical History:  Past Medical History  Diagnosis Date  . Coronary artery disease     s/p MI tx with Cypher DES to pRCA in 4/04; LHC 4/04 in the setting of his MI: Proximal LAD 30%, ostial diagonal 40-50%, ostial OM1 30%, proximal RCA 95% which was the infarct vessel   ;    Echocardiogram 7/09: Normal LV function.  Nuclear study  March 2013 negative for ischemia  . Dyslipidemia   . Seizure disorder   . Hypothyroidism   . DDD (degenerative disc disease)   . Hx MRSA infection     left buttocks abscess  . Rotator cuff injury     chronic rotator cuff injury status post repair  . Hypertension   . Syncope   . Dizziness   . Meniere's disease     Status post shunt  . BPH (benign prostatic hyperplasia)   . Stenosis of left internal carotid artery     40-59% on carotid doppler 2014  . Diabetes mellitus without complication   . Hyperlipidemia    Past Surgical History:  Past Surgical History  Procedure Laterality Date  . Coronary angioplasty with stent placement  07/14/2002    Stent to the right coronary artery  . Rotator cuff repair      for chronic rotator cuff injury  . Hernia repair  04/10/2008   scrotal hernia repair  . Endolymphatic shunt decompression  06/11/2009     Right endolymphatic sac decompression and shunt  placement    Assessment & Plan Clinical Impression: 79 y/o admitted for cardiac catheterization 01/30/2013 per Dr.McLean that showed 40-50% proximal LAD stenosis and advised medical management. Noted post catheterization with right facial numbness as well as slurred speech.  MRI shows bilateral anterior posterior infarcts. MRA confirms findings left PCA occlusion most prominently involving the left thalamus. Carotid Dopplers with left 60-79% ICA stenosis. Patient did not receive TPA. Maintained on Plavix therapy for CVA prophylaxis as well as cardiac history as well as the addition of subcutaneous heparin for DVT prophylaxis.  Patient transferred to CIR on 02/01/2013 .    Patient currently requires mod with basic self-care skills secondary to muscle weakness, decreased cardiorespiratoy endurance, abnormal tone, motor apraxia and decreased coordination, decreased visual perceptual skills and field cut, decreased attention to right, decreased attention and decreased sitting balance, decreased standing balance, decreased postural control, hemiplegia and decreased balance strategies.  Prior to hospitalization, patient was independent, working as a Programmer, multimedia, and driving.  Patient will benefit from skilled intervention to increase independence with basic self-care skills prior to discharge home with care partner.  Anticipate patient will require 24 hour supervision and occassional minimal physical assistance and follow up home health and follow up outpatient.  OT - End  of Session Endurance Deficit: Yes OT Assessment Rehab Potential: Good OT Patient demonstrates impairments in the following area(s): Balance;Cognition;Endurance;Motor;Perception;Safety;Sensory;Vision OT Basic ADL's Functional Problem(s): Eating;Grooming;Bathing;Dressing;Toileting OT Transfers Functional Problem(s):  Toilet;Tub/Shower OT Additional Impairment(s): Fuctional Use of Upper Extremity OT Plan OT Intensity: Minimum of 1-2 x/day, 45 to 90 minutes OT Frequency: 5 out of 7 days OT Duration/Estimated Length of Stay: 10-14 days OT Treatment/Interventions: Balance/vestibular training;Cognitive remediation/compensation;Discharge planning;Community reintegration;DME/adaptive equipment instruction;Functional mobility training;Neuromuscular re-education;Psychosocial support;Patient/family education;Self Care/advanced ADL retraining;UE/LE Strength taining/ROM;Therapeutic Exercise;Therapeutic Activities;UE/LE Coordination activities;Visual/perceptual remediation/compensation;Wheelchair propulsion/positioning OT Self Feeding Anticipated Outcome(s): Mod I OT Basic Self-Care Anticipated Outcome(s): Supervision OT Toileting Anticipated Outcome(s): Supervision OT Bathroom Transfers Anticipated Outcome(s): Supervision toilet, Min assist shower stall OT Recommendation Patient destination: Home Follow Up Recommendations: Home health OT;Outpatient OT Equipment Recommended: Tub/shower seat  Skilled Therapeutic Intervention 1)  Patient resting in w/c upon arrival with wife and daughter present yet left after introductions. Engaged in OT evaluation and self care retraining to include sponge bath (declined shower), dressing, toileting and toilet transfer.  Focused session on activity tolerance, sit><stands, standing balance, right visual attention, constant cues to attend to his RUE during all tasks secondary to poor proprioception, sensation, attention and occasional writhing and ballistic movements.  Patient resting in w/c with all items within reach.  2)  Patient resting in bed upon arrival with wife and daughter present.  Patient states he is very tired from therapy today.  Patient sat EOB to complete vision testing.  Patient with right visual field cut, states that when reading, the letters move around and he can only  see half of the words/page/sentence.  When completing the line cancellation test, patient able to accurately complete the entire page with time and considerable concentration.  Reviewed results with patient and family and informed patient that he will not be able to drive unless he schedules an appointment with a neuro-optomitrist. Patient assisted back to bed with all items within reach and family present.  OT Evaluation Precautions/Restrictions  Precautions Precautions: Fall Precaution Comments: right sided weakness, decreased fine and gross motor coordination, decreased sensation, decreased proprioception, occasional ballistic movements with RUE Restrictions Weight Bearing Restrictions: No Pain Denies pain Home Living/Prior Functioning Home Living Available Help at Discharge: Family;Available 24 hours/day Type of Home: House Home Access: Stairs to enter [employer] of Steps: 2 Entrance Stairs-Rails: None Home Layout: One level Additional Comments: Family educated regarding safety benefit of rail placement on steps and grab bars in bathroom, patient has walk in shower with door  Lives With: Spouse Venita Sheffield) Prior Function Level of Independence: Independent with basic ADLs;Independent with homemaking with ambulation;Independent with gait;Independent with transfers  Able to Take Stairs?: Yes Driving: Yes 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 ADL Refer to FIM below Vision/Perception  Vision - History Baseline Vision: Bifocals Patient Visual Report: Blurring of vision;Unable to keep objects in focus;Eye fatigue/eye pain/headache Vision - Assessment Visual Fields: Right visual field deficit;Right homonymous hemianopsia Perception Perception: Impaired Inattention/Neglect: Does not attend to right side of body;Does not attend to right visual field Praxis Praxis: Impaired Praxis Impairment Details: Motor planning  Cognition Overall Cognitive  Status: Impaired/Different from baseline Arousal/Alertness: Awake/alert Orientation Level: Oriented X4 Sustained Attention: Appears intact Selective Attention: Impaired Selective Attention Impairment: Verbal basic;Functional basic Memory: Impaired Memory Impairment: Decreased recall of new information;Decreased short term memory Decreased Short Term Memory: Verbal basic;Functional basic Awareness: Impaired Awareness Impairment: Emergent impairment Problem Solving: Impaired Problem Solving Impairment: Functional basic Organizing: Impaired Organizing Impairment: Functional basic Sensation Sensation Light Touch: Impaired  Detail Light Touch Impaired Details: Impaired RUE Stereognosis: Impaired Detail Stereognosis Impaired Details: Absent RUE Hot/Cold: Not tested Proprioception: Impaired Detail Proprioception Impaired Details: Impaired RUE Additional Comments: patient reports numbness and tingling in fingers of right hand Coordination Gross Motor Movements are Fluid and Coordinated: No Fine Motor Movements are Fluid and Coordinated: No Coordination and Movement Description: Several unsuccessful attempts to tie his shoes.  RUE ocassionally ballistic in nature and is further complicated by poor proprioception, poor sensation, visual deficits. Motor  Motor Motor: Other (comment) Motor - Skilled Clinical Observations: Ballistic movements of R UE Mobility  Bed Mobility Bed Mobility: Supine to Sit;Sitting - Scoot to Edge of Bed Supine to Sit: 4: Min assist;HOB flat;Other (comment) (w/out rails) Supine to Sit Details: Verbal cues for precautions/safety Sitting - Scoot to Edge of Bed: 4: Min guard Transfers Sit to Stand: 4: Min assist;With armrests;From bed;From chair/3-in-1 Sit to Stand Details: Tactile cues for sequencing;Verbal cues for sequencing;Verbal cues for technique;Tactile cues for placement;Verbal cues for safe use of DME/AE;Verbal cues for precautions/safety Sit to Stand  Details (indicate cue type and reason): cues to attend to position of R UE/LE Stand to Sit: 4: Min assist Stand to Sit Details (indicate cue type and reason): Tactile cues for sequencing;Tactile cues for placement;Verbal cues for sequencing;Verbal cues for precautions/safety;Verbal cues for technique;Verbal cues for safe use of DME/AE Stand to Sit Details: Cues to attend to position of R UE/LE  Trunk/Postural Assessment  Cervical Assessment Cervical Assessment: Within Functional Limits Thoracic Assessment Thoracic Assessment: Exceptions to [employer] (Kyphotic posture) Lumbar Assessment Lumbar Assessment: Within Functional Limits Postural Control Postural Control: Deficits on evaluation Righting Reactions: mild delay in sitting, significant delay in standing  Balance Balance Assessed: Yes (by PT) Static Sitting Balance Static Sitting - Balance Support: Bilateral upper extremity supported;Feet supported Static Sitting - Level of Assistance: 5: Stand by assistance Dynamic Sitting Balance Dynamic Sitting - Balance Support: Left upper extremity supported;Right upper extremity supported;Feet supported Dynamic Sitting - Level of Assistance: 4: Min assist Static Standing Balance Static Standing - Balance Support: Bilateral upper extremity supported;No upper extremity supported;During functional activity Static Standing - Level of Assistance: 4: Min assist Dynamic Standing Balance Dynamic Standing - Balance Support: No upper extremity supported;During functional activity Dynamic Standing - Level of Assistance: 3: Mod assist;4: Min assist Dynamic Standing - Balance Activities: Forward lean/weight shifting;Lateral lean/weight shifting Dynamic Standing - Comments: attempting to pull up pants Extremity/Trunk Assessment RUE Assessment RUE Assessment: Exceptions to [facility] RUE AROM (degrees) RUE Overall AROM Comments: WFL, h/o rotator cuff injury RUE Tone RUE Tone Comments: Occasional writhing and  ballistic movements LUE Assessment LUE Assessment: Within Functional Limits  FIM:  FIM - Eating Eating Activity: 5: Set-up assist for open containers;5: Set-up assist for cut food FIM - Grooming Grooming Steps: Wash, rinse, dry face;Wash, rinse, dry hands Grooming: 4: Patient completes 3 of 4 or 4 of 5 steps FIM - Bathing Bathing Steps Patient Completed: Chest;Right Arm;Left Arm;Abdomen;Front perineal area Bathing: 3: Mod-Patient completes 5-7 76f 10 parts or 50-74% FIM - Upper Body Dressing/Undressing Upper body dressing/undressing steps patient completed: Thread/unthread right sleeve of pullover shirt/dresss;Thread/unthread left sleeve of pullover shirt/dress;Put head through opening of pull over shirt/dress;Pull shirt over trunk Upper body dressing/undressing: 5: Set-up assist to: Obtain clothing/put away FIM - Lower Body Dressing/Undressing Lower body dressing/undressing steps patient completed: Thread/unthread left underwear leg;Thread/unthread left pants leg;Don/Doff right sock;Don/Doff left sock;Don/Doff right shoe;Don/Doff left shoe Lower body dressing/undressing: 3: Mod-Patient completed 50-74% of tasks FIM - Toileting Toileting steps  completed by patient: Adjust clothing prior to toileting;Performs perineal hygiene Toileting Assistive Devices: Grab bar or rail for support Toileting: 3: Mod-Patient completed 2 of 3 steps FIM - Diplomatic Services operational officer Devices: Elevated toilet seat;Grab bars;Walker Toilet Transfers: 3-To toilet/BSC: Mod A (lift or lower assist);3-From toilet/BSC: Mod A (lift or lower assist) FIM - Tub/Shower Transfers Tub/shower Transfers: 0-Activity did not occur or was simulated (declined)   Refer to Care Plan for Long Term Goals  Recommendations for other services: Neuropsych  Discharge Criteria: Patient will be discharged from OT if patient refuses treatment 3 consecutive times without medical reason, if treatment goals not met, if there  is a change in medical status, if patient makes no progress towards goals or if patient is discharged from hospital.  The above assessment, treatment plan, treatment alternatives and goals were discussed and mutually agreed upon: by patient and by family  SHAFFER, CHRISTINA 02/02/2013, 5:33 PM

## 2013-02-02 NOTE — IPOC Note (Signed)
Overall Plan of Care [facility]) Patient Details Name: [name] MRN: [id number] DOB: 1933-06-19  Admitting Diagnosis: L CVA  Hospital Problems: Principal Problem:   Embolic cerebral infarction     Functional Problem List: Nursing Safety;Motor  PT Balance;Endurance;Motor;Perception;Safety  OT Balance;Cognition;Endurance;Motor;Perception;Safety;Sensory;Vision  SLP    TR         Basic ADL's: OT Eating;Grooming;Bathing;Dressing;Toileting     Advanced  ADL's: OT       Transfers: PT Bed Mobility;Bed to Chair;Car;Furniture  OT Toilet;Tub/Shower     Locomotion: PT Stairs;Wheelchair Mobility;Ambulation     Additional Impairments: OT Fuctional Use of Upper Extremity  SLP Communication;Social Cognition expression Problem Solving;Memory;Attention;Awareness  TR      Anticipated Outcomes Item Anticipated Outcome  Self Feeding Mod I  Swallowing      Basic self-care  Supervision  Toileting  Supervision   Bathroom Transfers Supervision toilet, Min assist shower stall  Bowel/Bladder  Remain continent of bowel and bladder  Transfers  Supervision to Min A  Locomotion  Supervision to Min A  Communication  Mod I   Cognition  supervision   Pain  < 3  Safety/Judgment  Follow safety plan   Therapy Plan: PT Intensity: Minimum of 1-2 x/day ,45 to 90 minutes PT Frequency: 5 out of 7 days PT Duration Estimated Length of Stay: 10-14 days OT Intensity: Minimum of 1-2 x/day, 45 to 90 minutes OT Frequency: 5 out of 7 days OT Duration/Estimated Length of Stay: 10-14 days SLP Intensity: Minumum of 1-2 x/day, 30 to 90 minutes SLP Frequency: 5 out of 7 days SLP Duration/Estimated Length of Stay: 10-14 days       Team Interventions: Nursing Interventions Disease Management/Prevention  PT interventions Ambulation/gait training;Balance/vestibular training;Discharge planning;Disease management/prevention;Cognitive remediation/compensation;Functional mobility  training;Patient/family education;Therapeutic Exercise;Therapeutic Activities;UE/LE Coordination activities;UE/LE Strength taining/ROM;Wheelchair propulsion/positioning;Visual/perceptual remediation/compensation;Stair training;Neuromuscular re-education;DME/adaptive equipment instruction  OT Interventions Balance/vestibular training;Cognitive remediation/compensation;Discharge planning;Community reintegration;DME/adaptive equipment instruction;Functional mobility training;Neuromuscular re-education;Psychosocial support;Patient/family education;Self Care/advanced ADL retraining;UE/LE Strength taining/ROM;Therapeutic Exercise;Therapeutic Activities;UE/LE Coordination activities;Visual/perceptual remediation/compensation;Wheelchair propulsion/positioning  SLP Interventions Cognitive remediation/compensation;Cueing hierarchy;Functional tasks;Patient/family education;Therapeutic Activities;Speech/Language facilitation;Internal/external aids;Environmental controls  TR Interventions    SW/CM Interventions      Team Discharge Planning: Destination: PT-Home ,OT- Home , SLP-Home Projected Follow-up: PT-Home health PT;Outpatient PT;24 hour supervision/assistance, OT-  Home health OT;Outpatient OT, SLP-24 hour supervision/assistance;Outpatient SLP;Home Health SLP Projected Equipment Needs: PT-Rolling walker with 5" wheels;Wheelchair cushion (measurements);Wheelchair (measurements);To be determined, OT- Tub/shower seat, SLP-None recommended by SLP Patient/family involved in discharge planning: PT- Patient;Family member/caregiver,  OT-Patient;Family member/caregiver, SLP-Patient;Family member/caregiver  MD ELOS: 12 days Medical Rehab Prognosis:  Excellent Assessment: The patient has been admitted for CIR therapies. The team will be addressing, functional mobility, strength, stamina, balance, safety, adaptive techniques/equipment, self-care, bowel and bladder mgt, patient and caregiver education, NMR, vestibular rx,  cognition. Goals have been set at supervision to min assist with basic mobility and self- care and supervision to mod I with cognition and communication.    Ranelle Oyster, MD, FAAPMR      See Team Conference Notes for weekly updates to the plan of care

## 2013-02-02 NOTE — Evaluation (Signed)
Speech Language Pathology Assessment and Plan  Patient Details  Name: [name] MRN: [id number] Date of Birth: [birth year]  SLP Diagnosis: Dysarthria;Cognitive Impairments  Rehab Potential: Excellent ELOS: 10-14 days   Today's Date: 02/02/2013 Time: 1330-1430 Time Calculation (min): 60 min  Skilled Therapeutic Intervention: Administered cognitive-linguistic evaluation. Please see below for details. Provided education to both the pt and his family in regards to current cognitive-linguistic and speech impairments and goals of skilled SLP intervention. All verbalized understanding.   Problem List:  Patient Active Problem List   Diagnosis Date Noted  . Embolic cerebral infarction 02/02/2013  . CVA (cerebral infarction) 01/31/2013  . Chest pain 01/29/2013  . Stenosis of left internal carotid artery   . Loss of coordination 12/03/2012  . Obesity 08/20/2011  . Coronary artery disease   . Dyslipidemia   . Hypertension    Past Medical History:  Past Medical History  Diagnosis Date  . Coronary artery disease     s/p MI tx with Cypher DES to pRCA in 4/04; LHC 4/04 in the setting of his MI: Proximal LAD 30%, ostial diagonal 40-50%, ostial OM1 30%, proximal RCA 95% which was the infarct vessel   ;    Echocardiogram 7/09: Normal LV function.  Nuclear study  March 2013 negative for ischemia  . Dyslipidemia   . Seizure disorder   . Hypothyroidism   . DDD (degenerative disc disease)   . Hx MRSA infection     left buttocks abscess  . Rotator cuff injury     chronic rotator cuff injury status post repair  . Hypertension   . Syncope   . Dizziness   . Meniere's disease     Status post shunt  . BPH (benign prostatic hyperplasia)   . Stenosis of left internal carotid artery     40-59% on carotid doppler 2014  . Diabetes mellitus without complication   . Hyperlipidemia    Past Surgical History:  Past Surgical History  Procedure Laterality Date  . Coronary angioplasty with stent  placement  07/14/2002    Stent to the right coronary artery  . Rotator cuff repair      for chronic rotator cuff injury  . Hernia repair  04/10/2008    scrotal hernia repair  . Endolymphatic shunt decompression  06/11/2009     Right endolymphatic sac decompression and shunt  placement    Assessment / Plan / Recommendation Clinical Impression  Pt is a 79 y.o. right-handed male with history of CAD/MI with stenting her last cardiac catheterization 2004, hypertension and seizure disorder. Admitted 01/29/2013 with left-sided chest pain while raking leaves. Underwent cardiac catheterization 01/30/2013 per Dr.McLean that showed 40-50% proximal LAD stenosis and advised medical management. Noted post catheterization with right facial numbness as well as slurred speech. MRI shows bilateral anterior posterior infarcts. MRA confirms findings left PCA occlusion. Maintained on Plavix therapy for CVA prophylaxis as well as cardiac history as well as the addition of subcutaneous heparin for DVT prophylaxis. Occupational therapy evaluation completed 01/31/2013 recommendations of physical medicine rehabilitation consult. Patient was felt to be a good candidate for inpatient rehabilitation services and was admitted for a comprehensive rehabilitation program after consultation of the rehab team. Patient transferred to CIR on 02/01/2013 and presents with mild cognitive impairments characterized by decreased attention to right, decreased selective attention, decreased emergent awareness, decreased problem solving, decreased safety awareness and decreased working memory. Pt also presents with mild word-finding deficits with higher-level, complex information and mild dysarthria due to incoordination of  oral musculature. Patient will benefit from skilled SLP intervention to maximize cognitive function and overall functional communication to decrease caregiver burden for planned discharge home with 24 hour supervision/assist.  Anticipate patient will require f/u services at discharge.     SLP Assessment  Patient will need skilled Speech Lanaguage Pathology Services during CIR admission    Recommendations  Oral Care Recommendations: Oral care BID Patient destination: Home Follow up Recommendations: 24 hour supervision/assistance;Outpatient SLP;Home Health SLP Equipment Recommended: None recommended by SLP    SLP Frequency 5 out of 7 days   SLP Treatment/Interventions Cognitive remediation/compensation;Cueing hierarchy;Functional tasks;Patient/family education;Therapeutic Activities;Speech/Language facilitation;Internal/external aids;Environmental controls    Pain Pain Assessment Pain Assessment: No/denies pain  Short Term Goals: Week 1: SLP Short Term Goal 1 (Week 1): Pt will utilize speech intelligibility strategies at the sentence level with Mod I.  SLP Short Term Goal 2 (Week 1): Pt will utilize word-finding strategies during functional conversation with Mod I.  SLP Short Term Goal 3 (Week 1): Pt will utilize external memory aids to recall new, daily information with supervision verbal and visual cues.  SLP Short Term Goal 4 (Week 1): Pt will attend to right field of enviornment during functional tasks with Min A verbal and question cues.  SLP Short Term Goal 5 (Week 1): Pt will demonstrate functional problem solving with basic and familair tasks with supervision verbal and question cues.  SLP Short Term Goal 6 (Week 1): Pt will demonstrate selective attention for 30 minutes in a mildly distracting enviornment with supervision verbal cues.   See FIM for current functional status Refer to Care Plan for Long Term Goals  Recommendations for other services: None  Discharge Criteria: Patient will be discharged from SLP if patient refuses treatment 3 consecutive times without medical reason, if treatment goals not met, if there is a change in medical status, if patient makes no progress towards goals or if  patient is discharged from hospital.  The above assessment, treatment plan, treatment alternatives and goals were discussed and mutually agreed upon: by patient and by family  [name] 02/02/2013, 4:13 PM

## 2013-02-02 NOTE — Progress Notes (Signed)
Patient information entered into eRehab system and reviewed by Tora Duck, RN, CRRN, PPS Coordinator.  Information including medical coding and functional independence measure will be reviewed and updated through discharge.     Per nursing patient was given "Data Collection Information Summary for Patients in Inpatient Rehabilitation Facilities with attached "Privacy Act Statement-Health Care Records" upon admission.

## 2013-02-03 ENCOUNTER — Inpatient Hospital Stay (HOSPITAL_COMMUNITY): Payer: Medicare Other | Admitting: Physical Therapy

## 2013-02-03 ENCOUNTER — Inpatient Hospital Stay (HOSPITAL_COMMUNITY): Payer: Medicare Other | Admitting: *Deleted

## 2013-02-03 ENCOUNTER — Inpatient Hospital Stay (HOSPITAL_COMMUNITY): Payer: Medicare Other | Admitting: Speech Pathology

## 2013-02-03 DIAGNOSIS — I6340 Cerebral infarction due to embolism of unspecified cerebral artery: Secondary | ICD-10-CM

## 2013-02-03 DIAGNOSIS — R279 Unspecified lack of coordination: Secondary | ICD-10-CM

## 2013-02-03 DIAGNOSIS — I10 Essential (primary) hypertension: Secondary | ICD-10-CM

## 2013-02-03 DIAGNOSIS — I2510 Atherosclerotic heart disease of native coronary artery without angina pectoris: Secondary | ICD-10-CM

## 2013-02-03 LAB — GLUCOSE, CAPILLARY
Glucose-Capillary: 138 mg/dL — ABNORMAL HIGH (ref 70–99)
Glucose-Capillary: 131 mg/dL — ABNORMAL HIGH (ref 70–99)
Glucose-Capillary: 140 mg/dL — ABNORMAL HIGH (ref 70–99)
Glucose-Capillary: 152 mg/dL — ABNORMAL HIGH (ref 70–99)

## 2013-02-03 NOTE — Progress Notes (Signed)
Physical Therapy Note  Patient Details  Name: [name] MRN: [id number] Date of Birth: 03-16-1934 Today's Date: 02/03/2013  Time: 900-955 55 minutes  1:1 no c/o pain.  W/c mobility to/from gym with supervision, cuing for technique, pt able to recall technique from beginning to end of session.  Transfers with min A, cues for UE/LE set up.  Sit to stands without UE support for focus on equal wt bearing with min manual facilitation.  Standing NMR with L LE tapping up and stepping all directions with min-mod A for balance, tactile cues for R LE contraction in wt bearing.  Gait without AD multiple attempts with manual facilitation for wt shift, cues to increase cadence. Improved gait with increased cadence and manual facilitation at hips for trunk control.  Obstacle negotiation and stepping over objects with min-mod A without AD.  Stair negotiation with B handrails with cues for sequencing, min A.  Pt slightly impulsive with movements, requires cues to slow down for safety.   [name],[name] 02/03/2013, 9:55 AM

## 2013-02-03 NOTE — Progress Notes (Signed)
Speech Language Pathology Daily Session Note  Patient Details  Name: [name] MRN: [id number] Date of Birth: 1934/02/14  Today's Date: 02/03/2013 Time: 1005-1035 Time Calculation (min): 30 min  Short Term Goals: Week 1: SLP Short Term Goal 1 (Week 1): Pt will utilize speech intelligibility strategies at the sentence level with Mod I.  SLP Short Term Goal 2 (Week 1): Pt will utilize word-finding strategies during functional conversation with Mod I.  SLP Short Term Goal 3 (Week 1): Pt will utilize external memory aids to recall new, daily information with supervision verbal and visual cues.  SLP Short Term Goal 4 (Week 1): Pt will attend to right field of enviornment during functional tasks with Min A verbal and question cues.  SLP Short Term Goal 5 (Week 1): Pt will demonstrate functional problem solving with basic and familair tasks with supervision verbal and question cues.  SLP Short Term Goal 6 (Week 1): Pt will demonstrate selective attention for 30 minutes in a mildly distracting enviornment with supervision verbal cues.   Skilled Therapeutic Interventions: Treatment focus on cognitive-linguistic goals. SLP facilitated session by providing extra time for reading comprehension the word level. Pt's function was impacted by decreased vision, however, pt independently utilized a strategy with Mod I with extra time and was able to self-monitor and correct all errors independently. Pt also required Min verbal and question cues for word-finding during description task at the phrase and sentence level. Continue with current plan of care.    FIM:  Comprehension Comprehension Mode: Auditory Comprehension: 5-Follows basic conversation/direction: With no assist Expression Expression Mode: Verbal Expression: 4-Expresses basic 75 - 89% of the time/requires cueing 10 - 24% of the time. Needs helper to occlude trach/needs to repeat words. Social Interaction Social Interaction: 5-Interacts  appropriately 90% of the time - Needs monitoring or encouragement for participation or interaction. Problem Solving Problem Solving: 4-Solves basic 75 - 89% of the time/requires cueing 10 - 24% of the time Memory Memory: 4-Recognizes or recalls 75 - 89% of the time/requires cueing 10 - 24% of the time  Pain Pain Assessment Pain Assessment: No/denies pain  Therapy/Group: Individual Therapy  PAYNE, COURTNEY 02/03/2013, 11:04 AM

## 2013-02-03 NOTE — Progress Notes (Signed)
[name] is a 79 y.o. male 12-19-1933 [id number]  Subjective: No new complaints. No new problems. Slept well. Feeling OK.  Objective: Vital signs in last 24 hours: Temp:  [98.2 F (36.8 C)] 98.2 F (36.8 C) (11/15 0534) Pulse Rate:  [74-77] 77 (11/15 0810) Resp:  [17] 17 (11/15 0534) BP: (125-156)/(77-87) 152/78 mmHg (11/15 0810) SpO2:  [92 %] 92 % (11/15 0534) Weight change:  Last BM Date: 02/02/13  Intake/Output from previous day: 11/14 0701 - 11/15 0700 In: 1320 [P.O.:1320] Out: -  Last cbgs: CBG (last 3)   Recent Labs  02/02/13 1619 02/02/13 2058 02/03/13 0804  GLUCAP 160* 134* 138*     Physical Exam General: No apparent distress    HEENT: moist mucosa Lungs: Normal effort. Lungs clear to auscultation, no crackles or wheezes. Cardiovascular: Regular rate and rhythm, no edema Abdomen: S/NT/ND; BS(+) Musculoskeletal:  No change from before Neurological: No new neurological deficits Wounds: N/A    Skin: clear Alert, cooperative   Lab Results: BMET    Component Value Date/Time   NA 137 02/02/2013 0600   K 3.8 02/02/2013 0600   CL 99 02/02/2013 0600   CO2 29 02/02/2013 0600   GLUCOSE 128* 02/02/2013 0600   BUN 11 02/02/2013 0600   CREATININE 0.88 02/02/2013 0600   CREATININE 0.97 07/19/2012 0830   CALCIUM 8.7 02/02/2013 0600   GFRNONAA 81* 02/02/2013 0600   GFRAA >90 02/02/2013 0600   CBC    Component Value Date/Time   WBC 8.8 02/02/2013 0600   RBC 4.18* 02/02/2013 0600   HGB 12.6* 02/02/2013 0600   HCT 37.9* 02/02/2013 0600   PLT 239 02/02/2013 0600   MCV 90.7 02/02/2013 0600   MCH 30.1 02/02/2013 0600   MCHC 33.2 02/02/2013 0600   RDW 14.1 02/02/2013 0600   LYMPHSABS 2.1 02/02/2013 0600   MONOABS 1.0 02/02/2013 0600   EOSABS 0.5 02/02/2013 0600   BASOSABS 0.0 02/02/2013 0600    Studies/Results: Ct Head Wo Contrast  02/02/2013   CLINICAL DATA:  Larey Seat.  Recent stroke.  EXAM: CT HEAD WITHOUT CONTRAST  TECHNIQUE: Contiguous axial  images were obtained from the base of the skull through the vertex without intravenous contrast.  COMPARISON:  12/03/2012 and brain MR dated 01/30/2013.  FINDINGS: Subacute left thalamic lacune or infarct. Stable enlarged ventricles and subarachnoid spaces. No intracranial hemorrhage, mass lesion or CT evidence of acute infarction. Unremarkable bones and included paranasal sinuses.  IMPRESSION: 1. No acute abnormality. 2. Left thalamic subacute infarct 3. Stable atrophy.   Electronically Signed   By: Gordan Payment M.D.   On: 02/02/2013 01:13    Medications: I have reviewed the patient's current medications.  Assessment/Plan:   1. Embolic infarcts left PCA distribution, most prominently involving left thalamus after cardiac catheterization  -CT of head after fall last night negative for acute injury. Thalamic infarct stable  2. DVT Prophylaxis/Anticoagulation: Subcutaneous heparin. Monitor platelet counts and any signs of bleeding  3. Pain Management: Tylenol as needed. Monitor with increased mobility  4. Neuropsych: This patient is capable of making decisions on his own behalf.  5. CAD status post catheterization 01/30/2013. Followup cardiac services. Continue medical management with Plavix therapy  6. Seizure disorder. Tegretol 300 mg twice a day, Valium 2 mg twice a day.  7. Hypertension. Lopressor 50 mg twice a day, Cozaar 50 mg twice a day, Imdur 30 mg daily, hydrochlorothiazide 25 mg daily, clonidine 0.2 mg daily. Monitor with increased mobility  8. Diabetes mellitus with  peripheral neuropathy. Hemoglobin A1c 6.5. Sliding scale insulin her present. Check CBGs a.c. and at bedtime. Patient on Glucophage 500 mg twice a day prior to admission and resume as tolerated.  -follow for pattern and adjust accordingly  9. Hypothyroidism. Synthroid  10. BPH. Flomax. Check PVRs x3 and voiding patterns  11. Hyperlipidemia. Lipitor  Cont Rx    Length of stay, days: 2  Sonda Primes , MD 02/03/2013,  9:02 AM

## 2013-02-03 NOTE — Progress Notes (Addendum)
Occupational Therapy Note  Patient Details  Name: [name] MRN: [id number] Date of Birth: 02/22/1934 Today's Date: 02/03/2013  Time:0800-0900  ( )  1st session Pain: none Individual session Focus of treatment was bed mobility, transfers,  Neuro-muscular reeducation, sitting balance, standing balance,  therapeutic activities, sustained attention, postural control during bathing and dressing.  Pt. Lying supine.  Went from sidelying to sit with bed rail and SBA.  Transferred bed>wc>shower bench with max assist for lifting and lowering.  Stood in shower for minute during peri care.  Pt needed assist with peri area, and donning socks, pulling up pants.  Used RUE as gross assist with ability to grab towel and dry back. Wife present during session.  Provided education on dressing techniques and pt using RUE.    Time: 1445-1530  (45 min)  2nd session Pain:3/10 right shoulder Individual session  Engaged in wc mobility, sit to stand, transfers, RUE NMRE, sensory issues, Right attention and right HH.  Pt. Propelled wc to gym with minimal assist with turns.  Needed cues to look to right and not run into things.  Transferred wc to mat with mod assist.  R knee buckled but able to complete movement.  Engaged in RUE therapeutic activites with shoulder, elbow and hand in PNF patterns using cups, horseshoes,   Had difficulty with releasing object, but did better with practice.  Right UE worked at gross assist with activities.  Pt. Propelled wc back to room with minimal assist.   Left pt in wc with safety belt on and call bell,phone within reach.  Family present.         Humberto Seals 02/03/2013, 8:59 AM

## 2013-02-04 ENCOUNTER — Inpatient Hospital Stay (HOSPITAL_COMMUNITY): Payer: Medicare Other | Admitting: Physical Therapy

## 2013-02-04 DIAGNOSIS — E785 Hyperlipidemia, unspecified: Secondary | ICD-10-CM

## 2013-02-04 DIAGNOSIS — I6350 Cerebral infarction due to unspecified occlusion or stenosis of unspecified cerebral artery: Secondary | ICD-10-CM

## 2013-02-04 LAB — GLUCOSE, CAPILLARY
Glucose-Capillary: 126 mg/dL — ABNORMAL HIGH (ref 70–99)
Glucose-Capillary: 117 mg/dL — ABNORMAL HIGH (ref 70–99)
Glucose-Capillary: 126 mg/dL — ABNORMAL HIGH (ref 70–99)

## 2013-02-04 MED ORDER — WHITE PETROLATUM GEL
Status: AC
  Administered 2013-02-04: 0.2
  Filled 2013-02-04: qty 5

## 2013-02-04 NOTE — Progress Notes (Signed)
[name] is a 79 y.o. male [birth year] [id number]  Subjective: No new complaints. No new problems. Slept well. Feeling OK.  Objective: Vital signs in last 24 hours: Temp:  [97.6 F (36.4 C)-98 F (36.7 C)] 98 F (36.7 C) (11/16 0619) Pulse Rate:  [72-86] 82 (11/16 0619) Resp:  [18-20] 18 (11/16 0619) BP: (137-154)/(70-79) 149/77 mmHg (11/16 0619) SpO2:  [92 %-96 %] 96 % (11/16 0619) Weight change:  Last BM Date: 02/03/13  Intake/Output from previous day: 11/15 0701 - 11/16 0700 In: 1200 [P.O.:1200] Out: 400 [Urine:400] Last cbgs: CBG (last 3)   Recent Labs  02/03/13 1720 02/03/13 2036 02/04/13 0713  GLUCAP 140* 152* 126*     Physical Exam General: No apparent distress   Eating breakfast HEENT: moist mucosa Lungs: Normal effort. Lungs clear to auscultation, no crackles or wheezes. Cardiovascular: Regular rate and rhythm, no edema Abdomen: S/NT/ND; BS(+) Musculoskeletal:  No change from before Neurological: No new neurological deficits Wounds: N/A    Skin: clear Alert, cooperative   Lab Results: BMET    Component Value Date/Time   NA 137 02/02/2013 0600   K 3.8 02/02/2013 0600   CL 99 02/02/2013 0600   CO2 29 02/02/2013 0600   GLUCOSE 128* 02/02/2013 0600   BUN 11 02/02/2013 0600   CREATININE 0.88 02/02/2013 0600   CREATININE 0.97 07/19/2012 0830   CALCIUM 8.7 02/02/2013 0600   GFRNONAA 81* 02/02/2013 0600   GFRAA >90 02/02/2013 0600   CBC    Component Value Date/Time   WBC 8.8 02/02/2013 0600   RBC 4.18* 02/02/2013 0600   HGB 12.6* 02/02/2013 0600   HCT 37.9* 02/02/2013 0600   PLT 239 02/02/2013 0600   MCV 90.7 02/02/2013 0600   MCH 30.1 02/02/2013 0600   MCHC 33.2 02/02/2013 0600   RDW 14.1 02/02/2013 0600   LYMPHSABS 2.1 02/02/2013 0600   MONOABS 1.0 02/02/2013 0600   EOSABS 0.5 02/02/2013 0600   BASOSABS 0.0 02/02/2013 0600    Studies/Results: No results found.  Medications: I have reviewed the patient's current  medications.  Assessment/Plan:   1. Embolic infarcts left PCA distribution, most prominently involving left thalamus after cardiac catheterization  -CT of head after fall last night negative for acute injury. Thalamic infarct stable  2. DVT Prophylaxis/Anticoagulation: Subcutaneous heparin. Monitor platelet counts and any signs of bleeding  3. Pain Management: Tylenol as needed. Monitor with increased mobility  4. Neuropsych: This patient is capable of making decisions on his own behalf.  5. CAD status post catheterization 01/30/2013. Followup cardiac services. Continue medical management with Plavix therapy  6. Seizure disorder. Tegretol 300 mg twice a day, Valium 2 mg twice a day.  7. Hypertension. Lopressor 50 mg twice a day, Cozaar 50 mg twice a day, Imdur 30 mg daily, hydrochlorothiazide 25 mg daily, clonidine 0.2 mg daily. Monitor with increased mobility  8. Diabetes mellitus with peripheral neuropathy. Hemoglobin A1c 6.5. Sliding scale insulin her present. Check CBGs a.c. and at bedtime. Patient on Glucophage 500 mg twice a day prior to admission and resume as tolerated.  -follow for pattern and adjust accordingly  9. Hypothyroidism. Synthroid  10. BPH. Flomax. Check PVRs x3 and voiding patterns  11. Hyperlipidemia. Lipitor  Cont Rx    Length of stay, days: 3  Sonda Primes , MD 02/04/2013, 8:49 AM

## 2013-02-04 NOTE — Progress Notes (Signed)
Physical Therapy Note  Patient Details  Name: [name] MRN: [id number] Date of Birth: 07/14/1933 Today's Date: 02/04/2013  1000-1055 (55 minutes) individual Pain: c/o "soreness" right knee/ premedicated Focus of treatment: NMR rt LE in supine, sitting, standing; gait training; therapeutic exercise focused on activity tolerance, RT LE strengthening and control Treatment: Pt up in wc upon arrival; transfers stand/turn min/mod assist; sit to supine (mat) SBA; supine to sit (mat) SBA; NMR RT LE X 20 - ankle pumps, heel slides, hip abduction; sit to stand min assist with left lean; standing with minimal challenges in all directions; stepping up/down 4 inch step right LE 2 X 10 with RW support min assist with decreased eccentric control; gait 80 feet RW min assist X 2 with left lean and decreased eccentric control stand to sit : Nustep Level 5 x 10 minutes bilateral UE/LEs with perceived exertion of 13 somewhat hard; wc mobility-  120 feet x 2 using left extremities SBA. Pt returned to room with quick release belt in place.    HOUT,JIM 02/04/2013, 10:06 AM

## 2013-02-05 ENCOUNTER — Inpatient Hospital Stay (HOSPITAL_COMMUNITY): Payer: Medicare Other

## 2013-02-05 ENCOUNTER — Inpatient Hospital Stay (HOSPITAL_COMMUNITY): Payer: Medicare Other | Admitting: Speech Pathology

## 2013-02-05 DIAGNOSIS — R569 Unspecified convulsions: Secondary | ICD-10-CM

## 2013-02-05 DIAGNOSIS — E1165 Type 2 diabetes mellitus with hyperglycemia: Secondary | ICD-10-CM

## 2013-02-05 DIAGNOSIS — I6340 Cerebral infarction due to embolism of unspecified cerebral artery: Secondary | ICD-10-CM

## 2013-02-05 DIAGNOSIS — E1142 Type 2 diabetes mellitus with diabetic polyneuropathy: Secondary | ICD-10-CM

## 2013-02-05 DIAGNOSIS — IMO0001 Reserved for inherently not codable concepts without codable children: Secondary | ICD-10-CM

## 2013-02-05 LAB — GLUCOSE, CAPILLARY
Glucose-Capillary: 152 mg/dL — ABNORMAL HIGH (ref 70–99)
Glucose-Capillary: 148 mg/dL — ABNORMAL HIGH (ref 70–99)
Glucose-Capillary: 132 mg/dL — ABNORMAL HIGH (ref 70–99)
Glucose-Capillary: 134 mg/dL — ABNORMAL HIGH (ref 70–99)
Glucose-Capillary: 137 mg/dL — ABNORMAL HIGH (ref 70–99)
Glucose-Capillary: 162 mg/dL — ABNORMAL HIGH (ref 70–99)

## 2013-02-05 NOTE — Progress Notes (Signed)
Occupational Therapy Session Note  Patient Details  Name: [name] MRN: [id number] Date of Birth: [birth year]  Today's Date: 02/05/2013 Time: 0832-0926 Time Calculation (min): 54 min  Short Term Goals: Week 1: STG = LTG  Skilled Therapeutic Interventions/Progress Updates: ADL-retraining with emphasis on NMR of R-UE, transfer training (toilet, tub bench), dynamic sitting/standing balance, and functional mobility using RW.   Patient completed transfers bed > w/c, w/c <> tub bench, RW <> toilet, with supervision - steadying assist and verbal/tactile cues for placement of R-UE during transfers.   Patient required re-ed and hand guidance to incorporate use of R-UE during bathing but transferred training to perform dressing and shaving at sink with manual contact to initiate self-assisted use of R-UE during ADL.    Patient demonstrated good thoroughness during bathing/dressing and follow-through with teaching to prevent premature compensation for R-UE impairment.    Therapy Documentation Precautions:  Precautions Precautions: Fall Precaution Comments: right sided weakness, decreased fine and gross motor coordination, decreased sensation, decreased proprioception, occasional ballistic movements with RUE Restrictions Weight Bearing Restrictions: No   Pain: Pain Assessment Pain Assessment: No/denies pain Pain Score: 0-No pain  See FIM for current functional status  Therapy/Group: Individual Therapy  Second session: Time: 1030-1100 Time Calculation (min): 30 min  Pain Assessment: No pain  Skilled Therapeutic Interventions: NMR with emphasis on improved motor control of R-UE during functional activity.  With left UE manually constrained by therapist, patient was challenged to perform playing card matching activity, standing at table top, followed by 1 game of Goban, using only right hand to manipulate and place pieces on an inclined surface.   Patient demonstrated good tolerance to  activity with min verbal cues to attend to and locate pieces for continuous game play.   Gross motor control of R-UE was improved by use of 1 lb wrist weight and patient persevered through frustrations of impaired prehension throughout activities.  See FIM for current functional status  Therapy/Group: Individual Therapy  BARTHOLD,FRANK 02/05/2013, 10:17 AM

## 2013-02-05 NOTE — Progress Notes (Signed)
Physical Therapy Session Note  Patient Details  Name: [name] MRN: [id number] Date of Birth: 1934-02-23  Today's Date: 02/05/2013 Time: 0930-1030 Time Calculation (min): 60 min  Short Term Goals: Week 1:  PT Short Term Goal 1 (Week 1): Pt to perform transfers w/ min guard assis, verbal cues to attend to R UE/LE <50% PT Short Term Goal 2 (Week 1): Pt to propel w/c 150' w/ supervision, verbal cues to attend to R LE <25% PT Short Term Goal 3 (Week 1): Pt to amb 200' w/ RW and min guard assist, verbal/tactile cues for seq and safety <50%  Skilled Therapeutic Interventions/Progress Updates:  Pt received sitting in w/c, ready for therapy. Focus this session on R UE/LE NMR, dynamic standing balance and gait training. Pt able to propel w/c 175'x2 from room<>therapy gym w/ L UE and B LE for emphasis on attending to R LE during reciprocal motion. Pt did not req cues for attending to R LE, however, req min verbal cues for obstacle negotiation. P performed tapping of R LE on targets of various heights to challenge, motor control, proprioception and perception in // bars. 1.5lb weight applied to R LE for increased proprioception. Pt able to decrease from B UE to single UE during activity, req close (S) throughout w/ no LOB. Progression to horseshoe toss w/out B UE support to target controlled weight shift to R while reaching, grasping and releasing horseshoes w/ R UE. Pt req close (S) to min A overall, w/ no LOB. Pt using compensatory strategies to see all targets w/out cueing. Focus on ambulation at end of session first w/ use of shopping cart x200' for emphasis on fluidity and more normalized gait pattern then transitioning to RW x200' for function. When ambulating w/ RW, therapist facilitating weight shift to R side at hips to mimic quality of gait achieved when ambulating w/ shopping cart. Pt cued to alternate between looking at R UE/LE then looking ahead and thinking about position of R UE/LE. Pt w/ 1  LOB during amb, req min A for correction. Pt sitting in w/c at end of session w/ all needs in reach, quick release belt in place and wife in room.   Therapy Documentation Precautions:  Precautions Precautions: Fall Precaution Comments: right sided weakness, decreased fine and gross motor coordination, decreased sensation, decreased proprioception, occasional ballistic movements with RUE Restrictions Weight Bearing Restrictions: No   Pain: Pain Assessment Pain Assessment: No/denies pain Pain Score: 0-No pain  See FIM for current functional status  Therapy/Group: Individual Therapy  Denzil Hughes 02/05/2013, 12:06 PM

## 2013-02-05 NOTE — Progress Notes (Signed)
Speech Language Pathology Daily Session Note  Patient Details  Name: [name] MRN: [id number] Date of Birth: [birth year]  Today's Date: 02/05/2013 Time: 1415-1500 Time Calculation (min): 45 min  Short Term Goals: Week 1: SLP Short Term Goal 1 (Week 1): Pt will utilize speech intelligibility strategies at the sentence level with Mod I.  SLP Short Term Goal 2 (Week 1): Pt will utilize word-finding strategies during functional conversation with Mod I.  SLP Short Term Goal 3 (Week 1): Pt will utilize external memory aids to recall new, daily information with supervision verbal and visual cues.  SLP Short Term Goal 4 (Week 1): Pt will attend to right field of enviornment during functional tasks with Min A verbal and question cues.  SLP Short Term Goal 5 (Week 1): Pt will demonstrate functional problem solving with basic and familair tasks with supervision verbal and question cues.  SLP Short Term Goal 6 (Week 1): Pt will demonstrate selective attention for 30 minutes in a mildly distracting enviornment with supervision verbal cues.   Skilled Therapeutic Interventions: Treatment focus on cognitive goals. SLP facilitated session by providing extra time and supervision question cues for sequencing with 4 step picture cards. Pt attended to right field of environment throughout the task with Mod I and was Mod I for word-finding at the phrase level. Continue current plan of care.    FIM:  Comprehension Comprehension Mode: Auditory Comprehension: 5-Understands basic 90% of the time/requires cueing < 10% of the time Expression Expression Mode: Verbal Expression: 5-Expresses basic needs/ideas: With extra time/assistive device Social Interaction Social Interaction: 6-Interacts appropriately with others with medication or extra time (anti-anxiety, antidepressant). Problem Solving Problem Solving: 4-Solves basic 75 - 89% of the time/requires cueing 10 - 24% of the time Memory Memory:  4-Recognizes or recalls 75 - 89% of the time/requires cueing 10 - 24% of the time FIM - Eating Eating Activity: 5: Set-up assist for open containers  Pain Pain Assessment Pain Assessment: No/denies pain  Therapy/Group: Individual Therapy  PAYNE, COURTNEY 02/05/2013, 3:47 PM

## 2013-02-05 NOTE — Progress Notes (Signed)
Subjective/Complaints: Had a fairly good weekend. Reported some dizziness when he got out of bed yesterday.   A 12 point review of systems has been performed and if not noted above is otherwise negative.   Objective: Vital Signs: Blood pressure 156/73, pulse 72, temperature 98.1 F (36.7 C), temperature source Oral, resp. rate 17, height 5\' 9"  (1.753 m), weight 99.338 kg (219 lb), SpO2 95.00%. No results found. No results found for this basename: WBC, HGB, HCT, PLT,  in the last 72 hours No results found for this basename: NA, K, CL, CO, GLUCOSE, BUN, CREATININE, CALCIUM,  in the last 72 hours CBG (last 3)   Recent Labs  02/04/13 1633 02/04/13 2042 02/05/13 0727  GLUCAP 126* 148* 132*    Wt Readings from Last 3 Encounters:  02/01/13 99.338 kg (219 lb)  01/30/13 100.789 kg (222 lb 3.2 oz)  01/30/13 100.789 kg (222 lb 3.2 oz)    Physical Exam:  Constitutional: He is oriented to person, place, and time. He appears well-developed and well-nourished.  HENT:  Head: Normocephalic and atraumatic.  Right Ear: External ear normal.  Left Ear: External ear normal.  Eyes: EOM are normal. Right eye exhibits no discharge. Left eye exhibits no discharge.  Neck: Normal range of motion. Neck supple. No JVD present. No tracheal deviation present. No thyromegaly present.  Cardiovascular: Normal rate and regular rhythm.  Respiratory: Effort normal and breath sounds normal. No respiratory distress.  GI: Soft. Bowel sounds are normal. He exhibits no distension. There is no tenderness. There is no rebound.  Musculoskeletal: He exhibits no edema and no tenderness.  Lymphadenopathy:  He has no cervical adenopathy.  Neurological: He is alert and oriented to person, place, and time.  Mild right facial weakness. Decreased sensation around the mouth and tongue and to a lesser extent the right arm.  RUE 4/5. RLE 4 to 4+/5. Right upper and lower limb ataxia is persistent.  Speech slurred but improved.  Reasonable insight and awareness.   STM functional.  Skin: Skin is warm and dry.  Psychiatric: He has a normal mood and affect. His behavior is normal. Judgment and thought content normal   Assessment/Plan: 1. Functional deficits secondary to embolic, left PCA/thalamic infarct which require 3+ hours per day of interdisciplinary therapy in a comprehensive inpatient rehab setting. Physiatrist is providing close team supervision and 24 hour management of active medical problems listed below. Physiatrist and rehab team continue to assess barriers to discharge/monitor patient progress toward functional and medical goals. FIM: FIM - Bathing Bathing Steps Patient Completed: Chest;Right Arm;Left Arm;Abdomen;Front perineal area;Right upper leg;Left upper leg;Right lower leg (including foot);Left lower leg (including foot) Bathing: 4: Min-Patient completes 8-9 70f 10 parts or 75+ percent  FIM - Upper Body Dressing/Undressing Upper body dressing/undressing steps patient completed: Thread/unthread right sleeve of pullover shirt/dresss;Thread/unthread left sleeve of pullover shirt/dress;Put head through opening of pull over shirt/dress;Pull shirt over trunk Upper body dressing/undressing: 5: Set-up assist to: Obtain clothing/put away FIM - Lower Body Dressing/Undressing Lower body dressing/undressing steps patient completed: Thread/unthread left underwear leg;Thread/unthread left pants leg;Don/Doff right sock;Don/Doff left sock;Thread/unthread right underwear leg;Pull underwear up/down;Thread/unthread right pants leg;Pull pants up/down Lower body dressing/undressing: 3: Mod-Patient completed 50-74% of tasks  FIM - Toileting Toileting steps completed by patient: Adjust clothing prior to toileting;Performs perineal hygiene Toileting Assistive Devices: Grab bar or rail for support Toileting: 3: Mod-Patient completed 2 of 3 steps  FIM - Diplomatic Services operational officer Devices: Grab bars Toilet  Transfers: 3-To toilet/BSC: Mod A (  lift or lower assist);3-From toilet/BSC: Mod A (lift or lower assist)  FIM - Banker Devices: Bed rails Bed/Chair Transfer: 3: Chair or W/C > Bed: Mod A (lift or lower assist);5: Supine > Sit: Supervision (verbal cues/safety issues);2: Bed > Chair or W/C: Max A (lift and lower assist)  FIM - Locomotion: Wheelchair Distance: 175'x2 Locomotion: Wheelchair: 4: Travels 150 ft or more: maneuvers on rugs and over door sillls with minimal assistance (Pt.>75%) FIM - Locomotion: Ambulation Locomotion: Ambulation Assistive Devices: Designer, industrial/product Ambulation/Gait Assistance: 3: Mod assist Locomotion: Ambulation: 3: Travels 150 ft or more with moderate assistance (Pt: 50 - 74%)  Comprehension Comprehension Mode: Auditory Comprehension: 5-Follows basic conversation/direction: With extra time/assistive device  Expression Expression Mode: Verbal Expression: 5-Expresses basic needs/ideas: With extra time/assistive device  Social Interaction Social Interaction: 5-Interacts appropriately 90% of the time - Needs monitoring or encouragement for participation or interaction.  Problem Solving Problem Solving: 5-Solves basic 90% of the time/requires cueing < 10% of the time  Memory Memory: 4-Recognizes or recalls 75 - 89% of the time/requires cueing 10 - 24% of the time  Medical Problem List and Plan:  1. Embolic infarcts left PCA distribution, most prominently involving left thalamus after cardiac catheterization  2. DVT Prophylaxis/Anticoagulation: Subcutaneous heparin.   3. Pain Management: Tylenol as needed. Monitor with increased mobility  4. Neuropsych: This patient is capable of making decisions on his own behalf.  5. CAD status post catheterization 01/30/2013. Followup cardiac services. Continue medical management with Plavix therapy  6. Seizure disorder. Tegretol 300 mg twice a day, Valium 2 mg twice a day.   7.  Hypertension. Lopressor 50 mg twice a day, Cozaar 50 mg twice a day, Imdur 30 mg daily, hydrochlorothiazide 25 mg daily, clonidine 0.2 mg daily. Monitor with increased mobility  8. Diabetes mellitus with peripheral neuropathy. Hemoglobin A1c 6.5. Sliding scale insulin her present. Check CBGs a.c. and at bedtime. Patient on Glucophage 500 mg twice a day prior to admission  .   -reasonable control at present 9. Hypothyroidism. Synthroid  10. BPH. Flomax.  Voiding thus far.   11. Hyperlipidemia. Lipitor  LOS (Days) 4 A FACE TO FACE EVALUATION WAS PERFORMED  SWARTZ,ZACHARY T 02/05/2013 8:07 AM

## 2013-02-05 NOTE — Progress Notes (Signed)
Social Work Assessment and Plan  Patient Details  Name: [name] MRN: [id number] Date of Birth: Jul 29, 1933  Today's Date: 02/05/2013  Problem List:  Patient Active Problem List   Diagnosis Date Noted  . Embolic cerebral infarction 02/02/2013  . CVA (cerebral infarction) 01/31/2013  . Chest pain 01/29/2013  . Stenosis of left internal carotid artery   . Loss of coordination 12/03/2012  . Obesity 08/20/2011  . Coronary artery disease   . Dyslipidemia   . Hypertension    Past Medical History:  Past Medical History  Diagnosis Date  . Coronary artery disease     s/p MI tx with Cypher DES to pRCA in 4/04; LHC 4/04 in the setting of his MI: Proximal LAD 30%, ostial diagonal 40-50%, ostial OM1 30%, proximal RCA 95% which was the infarct vessel   ;    Echocardiogram 7/09: Normal LV function.  Nuclear study  March 2013 negative for ischemia  . Dyslipidemia   . Seizure disorder   . Hypothyroidism   . DDD (degenerative disc disease)   . Hx MRSA infection     left buttocks abscess  . Rotator cuff injury     chronic rotator cuff injury status post repair  . Hypertension   . Syncope   . Dizziness   . Meniere's disease     Status post shunt  . BPH (benign prostatic hyperplasia)   . Stenosis of left internal carotid artery     40-59% on carotid doppler 2014  . Diabetes mellitus without complication   . Hyperlipidemia    Past Surgical History:  Past Surgical History  Procedure Laterality Date  . Coronary angioplasty with stent placement  07/14/2002    Stent to the right coronary artery  . Rotator cuff repair      for chronic rotator cuff injury  . Hernia repair  04/10/2008    scrotal hernia repair  . Endolymphatic shunt decompression  06/11/2009     Right endolymphatic sac decompression and shunt  placement   Social History:  reports that he quit smoking about 51 years ago. He does not have any smokeless tobacco history on file. He reports that he does not drink alcohol  or use illicit drugs.  Family / Support Systems Marital Status: Married How Long?: 59 years Patient Roles: Spouse;Parent;Other (Comment) [employer] member) Spouse/Significant Other: Mohab Ashby [phone] Children: 3 daughters and 1 son Other Supports: Church family Anticipated Caregiver: Wife Ability/Limitations of Caregiver: Wife plans to be with pt at d/c and says she can provide some physical assistance.  Pt reports wife is very strong. Caregiver Availability: 24/7 Family Dynamics: good support  Social History Preferred language: English Religion: Non-Denominational Cultural Background: [employer] Read: Yes Write: Yes Employment Status: Retired Date Retired/Disabled/Unemployed: 1999 Age Retired: 27 Fish farm manager Issues: None Guardian/Conservator: N/A   Abuse/Neglect Physical Abuse: Denies Verbal Abuse: Denies Sexual Abuse: Denies Exploitation of patient/patient's resources: Denies Self-Neglect: Denies Possible abuse reported to:: [employer] of social services  Emotional Status Pt's affect, behavior and adjustment status: Pt has a positive attitude about his rehabilitation, yet he is a bit frustrated about his recent medical events. Recent Psychosocial Issues: None Pyschiatric History: None Substance Abuse History: None   Patient / Family Perceptions, Expectations & Goals Pt/Family understanding of illness & functional limitations: Pt/wife feel they have a good understanding of pt's condition and limitations. 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 Anticipated changes in roles/activities/participation: Pt takes care of bills, so he and wife are trying to figure that out.  Pt recognizes that he will not be able to do outside chores, so pt's son and sons-in-law are helping him. Pt/family expectations/goals: Pt  would like "to get back to walking" and doing the other things he was able to and likes to do prior to his hospitalization.  Community [employer] Agencies: None Premorbid Home Care/DME Agencies: Other (Comment) (Pt and wife cannot remember agency used for their knee surgeries.   Pt has a walker.) Transportation available at discharge: Family Resource referrals recommended: Neuropsychology;Support group (specify)  Discharge Planning Living Arrangements: Spouse/significant other (Dtr lives next door.  Another dtr stays with pt and his wife 2 nights a week while working in [place].) Support Systems: Spouse/significant other;Children;Friends/neighbors;Church/faith community Type of Residence: Private residence Civil engineer, contracting: Administrator (specify) ([employer]) Surveyor, quantity Resources: Restaurant manager, fast food Screen Referred: No Money Management: Patient Does the patient have any problems obtaining your medications?: No Home Management: Pt's wife is used to taking care of household chores.  Extended family to help with outside chores. Patient/Family Preliminary Plans: Pt will return home with his wife.  Children can help him some, as well. Barriers to Discharge: Steps Social Work Anticipated Follow Up Needs: HH/OP;Support Group Expected length of stay: 10-14 days  Clinical Impression CSW met with pt and his wife to introduce self and CSW role and to complete assessment.  Pt reported feeling less frustrated and more optimistic today than when CSW met him on Friday, 02-02-13.  Pt feels he is getting stronger and is more used to the therapy.  Pt's wife was present during some of the assessment, but pt gave most of the information.  Pt has good family and church family support.  He is appreciative of his care and therapy on Rehab, but definitely looks forward to going home.  Pt has a walker at home and his children are installing handrails and grab bars in his home.   Pt does not remember where he got his walker from nor the home care agency he or his wife used for in-home therapies.  CSW explained that we will help him to arrange that and obtain any necessary equipment for him at d/c.    [place], [place] 02/05/2013, 1:20 PM

## 2013-02-05 NOTE — Progress Notes (Signed)
[facility] Individual Statement of Services  Patient Name:  [name]  Date:  02/05/2013  Welcome to the [facility].  Our goal is to provide you with an individualized program based on your diagnosis and situation, designed to meet your specific needs.  With this comprehensive rehabilitation program, you will be expected to participate in at least 3 hours of rehabilitation therapies Monday-Friday, with modified therapy programming on the weekends.  Your rehabilitation program will include the following services:  Physical Therapy (PT), Occupational Therapy (OT), Speech Therapy (ST), 24 hour per day rehabilitation nursing, Therapeutic Recreation (TR), Neuropsychology, Case Management (Social Worker), Rehabilitation Medicine, Nutrition Services and Pharmacy Services  Weekly team conferences will be held on Tuesdays to discuss your progress.  Your Social Worker will talk with you frequently to get your input and to update you on team discussions.  Team conferences with you and your family in attendance may also be held.  Expected length of stay:  10 - 14 days                Overall anticipated outcome:   Supervision - Minimal Assistance  Depending on your progress and recovery, your program may change. Your Social Worker will coordinate services and will keep you informed of any changes. Your Social Worker's name and contact numbers are listed  below.  The following services may also be recommended but are not provided by the [facility]:   Driving Evaluations  Home Health Rehabiltiation Services  Outpatient Rehabilitation Services   Arrangements will be made to provide these services after discharge if needed.  Arrangements include referral to agencies that provide these services.  Your insurance has been verified to be:  Medicare and [employer] Your primary doctor is:  Lynnea Ferrier  Pertinent information will be  shared with your doctor and your insurance company.  Social Worker:  Staci Acosta, LCSW  [phone] or ([phone]  Information discussed with and copy given to patient by: Elvera Lennox, 02/05/2013, 12:44 PM

## 2013-02-06 ENCOUNTER — Inpatient Hospital Stay (HOSPITAL_COMMUNITY): Payer: Medicare Other | Admitting: Occupational Therapy

## 2013-02-06 ENCOUNTER — Inpatient Hospital Stay (HOSPITAL_COMMUNITY): Payer: Medicare Other

## 2013-02-06 ENCOUNTER — Inpatient Hospital Stay (HOSPITAL_COMMUNITY): Payer: Medicare Other | Admitting: Speech Pathology

## 2013-02-06 DIAGNOSIS — IMO0001 Reserved for inherently not codable concepts without codable children: Secondary | ICD-10-CM

## 2013-02-06 DIAGNOSIS — I6340 Cerebral infarction due to embolism of unspecified cerebral artery: Secondary | ICD-10-CM

## 2013-02-06 DIAGNOSIS — E1142 Type 2 diabetes mellitus with diabetic polyneuropathy: Secondary | ICD-10-CM

## 2013-02-06 DIAGNOSIS — E1165 Type 2 diabetes mellitus with hyperglycemia: Secondary | ICD-10-CM

## 2013-02-06 DIAGNOSIS — R569 Unspecified convulsions: Secondary | ICD-10-CM

## 2013-02-06 LAB — GLUCOSE, CAPILLARY
Glucose-Capillary: 122 mg/dL — ABNORMAL HIGH (ref 70–99)
Glucose-Capillary: 134 mg/dL — ABNORMAL HIGH (ref 70–99)
Glucose-Capillary: 142 mg/dL — ABNORMAL HIGH (ref 70–99)
Glucose-Capillary: 144 mg/dL — ABNORMAL HIGH (ref 70–99)

## 2013-02-06 MED ORDER — MECLIZINE HCL 25 MG PO TABS
25.0000 mg | ORAL_TABLET | Freq: Three times a day (TID) | ORAL | Status: DC | PRN
  Filled 2013-02-06 (×2): qty 1

## 2013-02-06 NOTE — Progress Notes (Signed)
Subjective/Complaints: Still complains of dizziness when he gets up out of bed in particular (room is spinning). States that he's had vertigo in the past.   A 12 point review of systems has been performed and if not noted above is otherwise negative.   Objective: Vital Signs: Blood pressure 149/90, pulse 71, temperature 98.1 F (36.7 C), temperature source Oral, resp. rate 19, height 5\' 9"  (1.753 m), weight 99.338 kg (219 lb), SpO2 99.00%. No results found. No results found for this basename: WBC, HGB, HCT, PLT,  in the last 72 hours No results found for this basename: NA, K, CL, CO, GLUCOSE, BUN, CREATININE, CALCIUM,  in the last 72 hours CBG (last 3)   Recent Labs  02/05/13 1618 02/05/13 2132 02/06/13 0715  GLUCAP 137* 162* 122*    Wt Readings from Last 3 Encounters:  02/01/13 99.338 kg (219 lb)  01/30/13 100.789 kg (222 lb 3.2 oz)  01/30/13 100.789 kg (222 lb 3.2 oz)    Physical Exam:  Constitutional: He is oriented to person, place, and time. He appears well-developed and well-nourished.  HENT:  Head: Normocephalic and atraumatic.  Right Ear: External ear normal.  Left Ear: External ear normal.  Eyes: EOM are normal. Right eye exhibits no discharge. Left eye exhibits no discharge.  Neck: Normal range of motion. Neck supple. No JVD present. No tracheal deviation present. No thyromegaly present.  Cardiovascular: Normal rate and regular rhythm.  Respiratory: Effort normal and breath sounds normal. No respiratory distress.  GI: Soft. Bowel sounds are normal. He exhibits no distension. There is no tenderness. There is no rebound.  Musculoskeletal: He exhibits no edema and no tenderness.  Lymphadenopathy:  He has no cervical adenopathy.  Neurological: He is alert and oriented to person, place, and time.  Mild right facial weakness. Decreased sensation around the mouth and tongue and to a lesser extent the right arm.  RUE 4/5. RLE 4 to 4+/5. Right upper and lower limb  ataxia remains.  Speech slurred but improved. Reasonable insight and awareness.   STM functional.  Skin: Skin is warm and dry.  Psychiatric: He has a normal mood and affect. His behavior is normal. Judgment and thought content normal   Assessment/Plan: 1. Functional deficits secondary to embolic, left PCA/thalamic infarct which require 3+ hours per day of interdisciplinary therapy in a comprehensive inpatient rehab setting. Physiatrist is providing close team supervision and 24 hour management of active medical problems listed below. Physiatrist and rehab team continue to assess barriers to discharge/monitor patient progress toward functional and medical goals. FIM: FIM - Bathing Bathing Steps Patient Completed: Chest;Right Arm;Left Arm;Abdomen;Front perineal area;Buttocks;Right upper leg;Left upper leg;Right lower leg (including foot);Left lower leg (including foot) Bathing: 5: Supervision: Safety issues/verbal cues  FIM - Upper Body Dressing/Undressing Upper body dressing/undressing steps patient completed: Thread/unthread left sleeve of pullover shirt/dress;Thread/unthread right sleeve of pullover shirt/dresss;Put head through opening of pull over shirt/dress;Pull shirt over trunk Upper body dressing/undressing: 5: Supervision: Safety issues/verbal cues FIM - Lower Body Dressing/Undressing Lower body dressing/undressing steps patient completed: Thread/unthread right underwear leg;Thread/unthread left underwear leg;Pull underwear up/down;Thread/unthread right pants leg;Thread/unthread left pants leg;Pull pants up/down;Fasten/unfasten pants;Don/Doff right shoe;Don/Doff left shoe;Fasten/unfasten right shoe;Fasten/unfasten left shoe Lower body dressing/undressing: 5: Supervision: Safety issues/verbal cues (needs helping applying TEDS))  FIM - Toileting Toileting steps completed by patient: Adjust clothing prior to toileting;Performs perineal hygiene Toileting Assistive Devices: Grab bar or rail  for support Toileting: 3: Mod-Patient completed 2 of 3 steps  FIM - Diplomatic Services operational officer  Devices: Therapist, music Transfers: 3-To toilet/BSC: Mod A (lift or lower assist);3-From toilet/BSC: Mod A (lift or lower assist)  FIM - Bed/Chair Transfer Bed/Chair Transfer Assistive Devices: Bed rails;Arm rests Bed/Chair Transfer: 4: Bed > Chair or W/C: Min A (steadying Pt. > 75%);4: Chair or W/C > Bed: Min A (steadying Pt. > 75%)  FIM - Locomotion: Wheelchair Distance: 175'x2 Locomotion: Wheelchair: 5: Travels 150 ft or more: maneuvers on rugs and over door sills with supervision, cueing or coaxing FIM - Locomotion: Ambulation Locomotion: Ambulation Assistive Devices: Designer, industrial/product Ambulation/Gait Assistance: 3: Mod assist Locomotion: Ambulation: 3: Travels 150 ft or more with moderate assistance (Pt: 50 - 74%)  Comprehension Comprehension Mode: Auditory Comprehension: 5-Understands basic 90% of the time/requires cueing < 10% of the time  Expression Expression Mode: Verbal Expression: 5-Expresses basic needs/ideas: With extra time/assistive device  Social Interaction Social Interaction: 6-Interacts appropriately with others with medication or extra time (anti-anxiety, antidepressant).  Problem Solving Problem Solving: 4-Solves basic 75 - 89% of the time/requires cueing 10 - 24% of the time  Memory Memory: 4-Recognizes or recalls 75 - 89% of the time/requires cueing 10 - 24% of the time  Medical Problem List and Plan:  1. Embolic infarcts left PCA distribution, most prominently involving left thalamus after cardiac catheterization  2. DVT Prophylaxis/Anticoagulation: Subcutaneous heparin.   3. Pain Management: Tylenol as needed. No issues at present 4. Neuropsych: This patient is capable of making decisions on his own behalf.  5. CAD status post catheterization 01/30/2013. Followup cardiac services. Continue medical management with Plavix therapy  6. Seizure  disorder. Tegretol 300 mg twice a day, Valium 2 mg twice a day.   7. Hypertension. Lopressor 50 mg twice a day, Cozaar 50 mg twice a day, Imdur 30 mg daily, hydrochlorothiazide 25 mg daily, clonidine 0.2 mg daily. Monitor with increased mobility  8. Diabetes mellitus with peripheral neuropathy. Hemoglobin A1c 6.5. Sliding scale insulin her present. Check CBGs a.c. and at bedtime. Patient on Glucophage 500 mg twice a day prior to admission  .   -reasonable control at present 9. Hypothyroidism. Synthroid  10. BPH. Flomax.  Voiding thus far.   11. Hyperlipidemia. Lipitor 12. Dizziness---hx of BPPV, obviously may be central related to stroke  -will add prn antivert  -will ask therapy to perform a vestibular evaluation  LOS (Days) 5 A FACE TO FACE EVALUATION WAS PERFORMED  SWARTZ,ZACHARY T 02/06/2013 7:49 AM

## 2013-02-06 NOTE — Progress Notes (Signed)
Speech Language Pathology Daily Session Note  Patient Details  Name: [name] MRN: [id number] Date of Birth: Dec 30, 1933  Today's Date: 02/06/2013 Time: 1115-1200 Time Calculation (min): 45 min  Short Term Goals: Week 1: SLP Short Term Goal 1 (Week 1): Pt will utilize speech intelligibility strategies at the sentence level with Mod I.  SLP Short Term Goal 2 (Week 1): Pt will utilize word-finding strategies during functional conversation with Mod I.  SLP Short Term Goal 3 (Week 1): Pt will utilize external memory aids to recall new, daily information with supervision verbal and visual cues.  SLP Short Term Goal 4 (Week 1): Pt will attend to right field of enviornment during functional tasks with Min A verbal and question cues.  SLP Short Term Goal 5 (Week 1): Pt will demonstrate functional problem solving with basic and familair tasks with supervision verbal and question cues.  SLP Short Term Goal 6 (Week 1): Pt will demonstrate selective attention for 30 minutes in a mildly distracting enviornment with supervision verbal cues.   Skilled Therapeutic Interventions: Treatment focus on cognitive goals. Upon arrival, pt sitting up in his wheelchair but reported he is feeling "down." SLP facilitated session by providing extra time and supervision verbal cues for propulsion of wheelchair to office in regards to functional problem solving and attention to right field of environment.  Pt also required supervision verbal cues for scanning task and Min A verbal cues with extra time for functional problem solving/scanning in a visually distracting and mildly complex money management task. Continue current plan of care.    FIM:  Comprehension Comprehension Mode: Auditory Comprehension: 5-Understands basic 90% of the time/requires cueing < 10% of the time Expression Expression Mode: Verbal Expression: 5-Expresses basic needs/ideas: With extra time/assistive device Social Interaction Social  Interaction: 6-Interacts appropriately with others with medication or extra time (anti-anxiety, antidepressant). Problem Solving Problem Solving: 4-Solves basic 75 - 89% of the time/requires cueing 10 - 24% of the time Memory Memory: 4-Recognizes or recalls 75 - 89% of the time/requires cueing 10 - 24% of the time  Pain Pain Assessment Pain Assessment: No/denies pain  Therapy/Group: Individual Therapy  PAYNE, COURTNEY 02/06/2013, 1:07 PM

## 2013-02-06 NOTE — Progress Notes (Signed)
Occupational Therapy Session Note  Patient Details  Name: [name] MRN: [id number] Date of Birth: Oct 19, 1933  Today's Date: 02/06/2013 Time: 4098-1191 Time Calculation (min): 55 min  Short Term Goals: Week 1:  OT Short Term Goal 1 (Week 1): STG=LTG due to [facility]  Skilled Therapeutic Interventions/Progress Updates:  Patient sleeping in bed with wife at his bedside upon arrival.  Patient was difficult to arouse initially.  Engaged him in self care tasks to include stand at sink to wash hands, don his shoes and tie the laces, ambulate to therapy gym with RW, handwriting and visual-perceptual tabletop tasks, and sit><stands.  Focused session on slow and controlled movements during all functional mobility and when using his RUE for all tasks.  Patient requires mod cues to keep an eye on his RUE/right hand while using it for any functional task.  Patient is less successful when he does not look at his hand and when he moves too fast.  Patient also requires vcs to grade his RUE movement and hand grip.  Provided patient with built up foam grip on pencil to increase success with grip when holding pencil.  Patient required vcs to reposition pencil in his hand throughout the task.  Patient initially dated the page as "11-18/16".  After 4 questioning cues, patient guessed "2019, 2018, 2015".  Patient could not recall the year despite cues. Patient assisted to recliner with all items in reach and wife at his side.  Therapy Documentation Precautions:  Precautions Precautions: Fall Precaution Comments: right sided weakness, decreased fine and gross motor coordination, decreased sensation, decreased proprioception, occasional ballistic movements with RUE Restrictions Weight Bearing Restrictions: No Pain: Pain Assessment Pain Assessment: No/denies pain  Therapy/Group: Individual Therapy  SHAFFER, CHRISTINA 02/06/2013, 4:36 PM

## 2013-02-06 NOTE — Progress Notes (Signed)
Occupational Therapy Session Note  Patient Details  Name: CASANOVA SCHURMAN MRN: [id number] Date of Birth: February 21, 1934  Today's Date: 02/06/2013 Time: 0730-0825 Time Calculation (min): 55 min  Short Term Goals: Week 1:   STG=LTG  Skilled Therapeutic Interventions/Progress Updates:    Pt resting in bed upon arrival and agreeable to bathing at shower level and dressing with sit<>stand from tub bench.  Pt amb with RW to bathroom with steady A.  Pt completed all tasks at supervision level alternating between using RUE and LUE to perform bathing tasks.  Pt exhibits decreased coordination in RUE but exhibits increased control when attending to RUE.  Pt completed all dressing tasks with supervision including tieing shoes.  Pt exhibited LOB X 1 requiring assistance when ambulating out of bathroom into room.  Pt completed grooming tasks while standing at sink.Pt recognized that he was standing too far away from RW which caused to LOB. Focus on activity tolerance, dynamic standing balance, increased RUE use for functional tasks, and safety awareness.  Therapy Documentation Precautions:  Precautions Precautions: Fall Precaution Comments: right sided weakness, decreased fine and gross motor coordination, decreased sensation, decreased proprioception, occasional ballistic movements with RUE Restrictions Weight Bearing Restrictions: No General:   Pain: Pain Assessment Pain Assessment: No/denies pain  See FIM for current functional status  Therapy/Group: Individual Therapy  Rich Brave 02/06/2013, 8:26 AM

## 2013-02-06 NOTE — Progress Notes (Signed)
Physical Therapy Session Note  Patient Details  Name: [name] MRN: [id number] Date of Birth: Oct 14, 1933  Today's Date: 02/06/2013 Time: 0730-0825 Time Calculation (min): 55 min  Short Term Goals: Week 1:  PT Short Term Goal 1 (Week 1): Pt to perform transfers w/ min guard assis, verbal cues to attend to R UE/LE <50% PT Short Term Goal 2 (Week 1): Pt to propel w/c 150' w/ supervision, verbal cues to attend to R LE <25% PT Short Term Goal 3 (Week 1): Pt to amb 200' w/ RW and min guard assist, verbal/tactile cues for seq and safety <50%  Skilled Therapeutic Interventions/Progress Updates:  1:1. Pt received sitting in w/c, w/ granddaughter in room. Focus this session on gait training, functional transfers and NMR or R UE/LE. Pt able to propel w/c 150'x2 w/ B LE for emphasis on attending to R LE during reciprocal motion. Pt able to amb >200'x2 w/ RW on hard level and carpeted home areas to target ambulation in a straight path, attending to obstacles on R side of environment and pushing RW evenly. 8lb applied to RW for increased proprioceptive feedback.  Pt able to safely negotiate up/down 10'x2,1 x w/ B rails and 2x w/ single L rail, mod cues for seq. Pt states that family will put in railings at home. Pt performed multiple furniture transfers (sit<>stand and supine<>sit) to surfaces of various heights and arm rest availability w/ focus on safety by keeping RW close and reaching back prior to sitting. Pt req decreased cues w/ repetition. Pt noting increased dizziness after transitional changes, however, decreasing w/ rest. BP recorded as: 119/77 (supine), 148/88 (sitting), 150/81 (standing). Use of NuStep at end of session to facilitate increased WB through R LE, maintaining R UE gras and overall proprioception of R side. Level 5x54min w/ excellent tolerance, pt able to main correct placement of R UE/LE entire time. Pt req close(S) to min A for all functional mobility throughout this session. Pt  sitting in w/c at end of session w/ all needs in reach, quick release belt in place. RN aware that pt ok to be in room w/out supervision.   Therapy Documentation Precautions:  Precautions Precautions: Fall Precaution Comments: right sided weakness, decreased fine and gross motor coordination, decreased sensation, decreased proprioception, occasional ballistic movements with RUE Restrictions Weight Bearing Restrictions: No Vital Signs: Therapy Vitals Temp: 98.1 F (36.7 C) Temp src: Oral Pulse Rate: 64 Resp: 19 BP: 150/81 mmHg Patient Position, if appropriate: Standing Oxygen Therapy SpO2: 99 % O2 Device: None (Room air) Pain: Pain Assessment Pain Assessment: No/denies pain  See FIM for current functional status  Therapy/Group: Individual Therapy  Denzil Hughes 02/06/2013, 9:11 AM

## 2013-02-07 ENCOUNTER — Inpatient Hospital Stay (HOSPITAL_COMMUNITY): Payer: Medicare Other

## 2013-02-07 ENCOUNTER — Inpatient Hospital Stay (HOSPITAL_COMMUNITY): Payer: Medicare Other | Admitting: Speech Pathology

## 2013-02-07 LAB — GLUCOSE, CAPILLARY
Glucose-Capillary: 150 mg/dL — ABNORMAL HIGH (ref 70–99)
Glucose-Capillary: 138 mg/dL — ABNORMAL HIGH (ref 70–99)
Glucose-Capillary: 136 mg/dL — ABNORMAL HIGH (ref 70–99)
Glucose-Capillary: 127 mg/dL — ABNORMAL HIGH (ref 70–99)

## 2013-02-07 NOTE — Progress Notes (Signed)
Subjective/Complaints: Up with OT already this morning. Denies pain. Making progress. ?depressed mood  A 12 point review of systems has been performed and if not noted above is otherwise negative.   Objective: Vital Signs: Blood pressure 157/79, pulse 76, temperature 98.4 F (36.9 C), temperature source Oral, resp. rate 17, height 5\' 9"  (1.753 m), weight 98.8 kg (217 lb 13 oz), SpO2 94.00%. No results found. No results found for this basename: WBC, HGB, HCT, PLT,  in the last 72 hours No results found for this basename: NA, K, CL, CO, GLUCOSE, BUN, CREATININE, CALCIUM,  in the last 72 hours CBG (last 3)   Recent Labs  02/06/13 1630 02/06/13 2031 02/07/13 0735  GLUCAP 142* 144* 150*    Wt Readings from Last 3 Encounters:  02/07/13 98.8 kg (217 lb 13 oz)  01/30/13 100.789 kg (222 lb 3.2 oz)  01/30/13 100.789 kg (222 lb 3.2 oz)    Physical Exam:  Constitutional: He is oriented to person, place, and time. He appears well-developed and well-nourished.  HENT:  Head: Normocephalic and atraumatic.  Right Ear: External ear normal.  Left Ear: External ear normal.  Eyes: EOM are normal. Right eye exhibits no discharge. Left eye exhibits no discharge.  Neck: Normal range of motion. Neck supple. No JVD present. No tracheal deviation present. No thyromegaly present.  Cardiovascular: Normal rate and regular rhythm.  Respiratory: Effort normal and breath sounds normal. No respiratory distress.  GI: Soft. Bowel sounds are normal. He exhibits no distension. There is no tenderness. There is no rebound.  Musculoskeletal: He exhibits no edema and no tenderness.  Lymphadenopathy:  He has no cervical adenopathy.  Neurological: He is alert and oriented to person, place, and time.  Mild right facial weakness. Decreased sensation around the mouth and tongue and to a lesser extent the right arm.  RUE 4/5. RLE 4 to 4+/5. Right upper and lower limb ataxia remains.  Speech slurred but improved.  Reasonable insight and awareness.   STM functional.  Skin: Skin is warm and dry.  Psychiatric: He has a normal mood and affect. His behavior is normal. Judgment and thought content normal. Affect a little flat but generally appropriate   Assessment/Plan: 1. Functional deficits secondary to embolic, left PCA/thalamic infarct which require 3+ hours per day of interdisciplinary therapy in a comprehensive inpatient rehab setting. Physiatrist is providing close team supervision and 24 hour management of active medical problems listed below. Physiatrist and rehab team continue to assess barriers to discharge/monitor patient progress toward functional and medical goals. FIM: FIM - Bathing Bathing Steps Patient Completed: Chest;Right Arm;Left Arm;Abdomen;Front perineal area;Buttocks;Right upper leg;Left upper leg;Right lower leg (including foot);Left lower leg (including foot) Bathing: 4: Steadying assist  FIM - Upper Body Dressing/Undressing Upper body dressing/undressing steps patient completed: Thread/unthread left sleeve of pullover shirt/dress;Thread/unthread right sleeve of pullover shirt/dresss;Put head through opening of pull over shirt/dress;Pull shirt over trunk Upper body dressing/undressing: 5: Supervision: Safety issues/verbal cues FIM - Lower Body Dressing/Undressing Lower body dressing/undressing steps patient completed: Thread/unthread right underwear leg;Thread/unthread left underwear leg;Pull underwear up/down;Thread/unthread right pants leg;Thread/unthread left pants leg;Pull pants up/down;Fasten/unfasten pants;Don/Doff right shoe;Don/Doff left shoe;Fasten/unfasten right shoe;Fasten/unfasten left shoe;Don/Doff right sock;Don/Doff left sock Lower body dressing/undressing: 4: Steadying Assist  FIM - Toileting Toileting steps completed by patient: Adjust clothing prior to toileting;Performs perineal hygiene;Adjust clothing after toileting Toileting Assistive Devices: Grab bar or rail  for support Toileting: 4: Steadying assist  FIM - Diplomatic Services operational officer Devices: Walker;Elevated toilet seat;Bedside commode Toilet Transfers:  4-To toilet/BSC: Min A (steadying Pt. > 75%);4-From toilet/BSC: Min A (steadying Pt. > 75%)  FIM - Bed/Chair Transfer Bed/Chair Transfer Assistive Devices: Bed rails;Walker Bed/Chair Transfer: 5: Supine > Sit: Supervision (verbal cues/safety issues);4: Bed > Chair or W/C: Min A (steadying Pt. > 75%)  FIM - Locomotion: Wheelchair Distance: 175'x2 Locomotion: Wheelchair: 5: Travels 150 ft or more: maneuvers on rugs and over door sills with supervision, cueing or coaxing FIM - Locomotion: Ambulation Locomotion: Ambulation Assistive Devices: Designer, industrial/product Ambulation/Gait Assistance: 4: Min assist Locomotion: Ambulation: 2: Travels 50 - 149 ft with minimal assistance (Pt.>75%)  Comprehension Comprehension Mode: Auditory Comprehension: 5-Understands basic 90% of the time/requires cueing < 10% of the time  Expression Expression Mode: Verbal Expression: 5-Expresses basic needs/ideas: With extra time/assistive device  Social Interaction Social Interaction: 6-Interacts appropriately with others with medication or extra time (anti-anxiety, antidepressant).  Problem Solving Problem Solving: 4-Solves basic 75 - 89% of the time/requires cueing 10 - 24% of the time  Memory Memory: 4-Recognizes or recalls 75 - 89% of the time/requires cueing 10 - 24% of the time  Medical Problem List and Plan:  1. Embolic infarcts left PCA distribution, most prominently involving left thalamus after cardiac catheterization  2. DVT Prophylaxis/Anticoagulation: Subcutaneous heparin.   3. Pain Management: Tylenol as needed. No issues at present 4. Neuropsych: This patient is capable of making decisions on his own behalf.   -neuropsych to assess mood  -check thyroid. No antidepressant at present 5. CAD status post catheterization 01/30/2013.  Followup cardiac services. Continue medical management with Plavix therapy  6. Seizure disorder. Tegretol 300 mg twice a day, Valium 2 mg twice a day.   7. Hypertension. Lopressor 50 mg twice a day, Cozaar 50 mg twice a day, Imdur 30 mg daily, hydrochlorothiazide 25 mg daily, clonidine 0.2 mg daily. Monitor with increased mobility  8. Diabetes mellitus with peripheral neuropathy. Hemoglobin A1c 6.5. Sliding scale insulin her present. Check CBGs a.c. and at bedtime. Patient on Glucophage 500 mg twice a day prior to admission  .   -reasonable control at present 9. Hypothyroidism. Synthroid --check TSH 10. BPH. Flomax.  Voiding thus far.   11. Hyperlipidemia. Lipitor 12. Dizziness---hx of BPPV, obviously may be central related to stroke, episodes appear fairly limited  -added prn antivert  -will ask therapy to perform a vestibular evaluation  LOS (Days) 6 A FACE TO FACE EVALUATION WAS PERFORMED  SWARTZ,ZACHARY T 02/07/2013 8:33 AM

## 2013-02-07 NOTE — Progress Notes (Signed)
Speech Language Pathology Daily Session Note  Patient Details  Name: [name] MRN: [id number] Date of Birth: Feb 14, 1934  Today's Date: 02/07/2013 Time: 4098-1191 Time Calculation (min): 15 min  Short Term Goals: Week 1: SLP Short Term Goal 1 (Week 1): Pt will utilize speech intelligibility strategies at the sentence level with Mod I.  SLP Short Term Goal 2 (Week 1): Pt will utilize word-finding strategies during functional conversation with Mod I.  SLP Short Term Goal 3 (Week 1): Pt will utilize external memory aids to recall new, daily information with supervision verbal and visual cues.  SLP Short Term Goal 4 (Week 1): Pt will attend to right field of enviornment during functional tasks with Min A verbal and question cues.  SLP Short Term Goal 5 (Week 1): Pt will demonstrate functional problem solving with basic and familair tasks with supervision verbal and question cues.  SLP Short Term Goal 6 (Week 1): Pt will demonstrate selective attention for 30 minutes in a mildly distracting enviornment with supervision verbal cues.   Skilled Therapeutic Interventions: Treatment focus on cognitive goals. SLP facilitated session by providing a notebook to utilize external aids to increase recall of new, daily information. Pt required Mod question cues to recall events from previous therapy sessions and clinician had to write information down to impaired R UE.    FIM:  Comprehension Comprehension Mode: Auditory Comprehension: 5-Understands basic 90% of the time/requires cueing < 10% of the time Expression Expression Mode: Verbal Expression: 5-Expresses basic needs/ideas: With no assist Social Interaction Social Interaction: 6-Interacts appropriately with others with medication or extra time (anti-anxiety, antidepressant). Problem Solving Problem Solving: 4-Solves basic 75 - 89% of the time/requires cueing 10 - 24% of the time Memory Memory: 3-Recognizes or recalls 50 - 74% of the  time/requires cueing 25 - 49% of the time FIM - Eating Eating Activity: 6: More than reasonable amount of time  Pain Pain Assessment Pain Assessment: No/denies pain  Therapy/Group: Individual Therapy  PAYNE, COURTNEY 02/07/2013, 4:23 PM

## 2013-02-07 NOTE — Progress Notes (Signed)
Occupational Therapy Session Note  Patient Details  Name: [name] MRN: [id number] Date of Birth: 1933-12-21  Today's Date: 02/07/2013 Time: 0700-0756 Time Calculation (min): 56 min  Short Term Goals: Week 1:  OT Short Term Goal 1 (Week 1): STG=LTG due to [facility]  Skilled Therapeutic Interventions/Progress Updates:    Pt resting in bed upon arrival with wife at side.  Pt stated he was ready to take a shower.  Pt also stated that he thought he had "more feeling" in his right arm. Pt amb with RW into bathroom and transfer to toilet before transferring to tub bench in shower.  Pt completed bathing tasks requiring steady assist only when standing to bathe buttocks.  Pt completed dressing with sit<>stand from tub bench before amb with RW to room to don shoes and complete grooming while standing at sink.  Pt initiating increased use of RUE and exhibits increased control when using RUE as well as attending to RUE.  Pt encouraged by improvements today. Focus on safety awareness, dynamic standing balance, activity tolerance, and increased use of RUE during functional tasks.  Therapy Documentation Precautions:  Precautions Precautions: Fall Precaution Comments: right sided weakness, decreased fine and gross motor coordination, decreased sensation, decreased proprioception, occasional ballistic movements with RUE Restrictions Weight Bearing Restrictions: No Pain: Pain Assessment Pain Assessment: No/denies pain  See FIM for current functional status  Therapy/Group: Individual Therapy  Rich Brave 02/07/2013, 7:58 AM

## 2013-02-07 NOTE — Patient Care Conference (Signed)
Inpatient RehabilitationTeam Conference and Plan of Care Update Date: 02/06/2013   Time: 3:00 PM    Patient Name: [name]      Medical Record Number: [id number]  Date of Birth: 1933/04/29 Sex: Male         Room/Bed: 4W19C/4W19C-01 Payor Info: Payor: MEDICARE / Plan: MEDICARE PART A AND B / Product Type: *No Product type* /    Admitting Diagnosis: L CVA  Admit Date/Time:  02/01/2013  5:20 PM Admission Comments: No comment available   Primary Diagnosis:  Embolic cerebral infarction Principal Problem: Embolic cerebral infarction  Patient Active Problem List   Diagnosis Date Noted  . Embolic cerebral infarction 02/02/2013  . CVA (cerebral infarction) 01/31/2013  . Chest pain 01/29/2013  . Stenosis of left internal carotid artery   . Loss of coordination 12/03/2012  . Obesity 08/20/2011  . Coronary artery disease   . Dyslipidemia   . Hypertension     Expected Discharge Date: Expected Discharge Date: 02/13/13  Team Members Present: Physician leading conference: Dr. Faith Rogue Social Worker Present: Amada Jupiter, LCSW Nurse Present: Carlean Purl, RN PT Present: Zerita Boers, PT OT Present: Ardis Rowan, Corky Crafts, OT SLP Present: Feliberto Gottron, SLP PPS Coordinator present : Tora Duck, RN, CRRN;Becky Henrene Dodge, PT     Current Status/Progress Goal Weekly Team Focus  Medical   left thalamic infarct, balance, coordination issues  improve coordination, improve activity tolerance  balance coordination mgt, ego support   Bowel/Bladder   continent bowel/bladder; LBM 11/17 after Sorbitol  to remain continent  monitor for incontinence   Swallow/Nutrition/ Hydration             ADL's   Min A for ADL, Mod A for transfers  Supervision, Min A for transfers  NMR of R-UE, transfers, endurance   Mobility   Min-Mod A   Supervision to Min A at ambulatory level  functional endurance, dynamic standing balance, attending to R UE/LE, motor control in R UE/LE,  proprioception   Communication   Supervision  Mod I  word-finding, speech intelligibility   Safety/Cognition/ Behavioral Observations  Min A  Supervision  attention to right field of enviornment, problem solving, memory    Pain   denies any pain  to keep patient pain free or pain <2   continue to monitor for pain and medicate prn   Skin   scattered bruising; laceration to posterior scabbed over  to prevent skin breakdown while on rehab  monitor skin q shift and prn; treat appropriately    Rehab Goals Patient on target to meet rehab goals: Yes Rehab Goals Revised: None *See Care Plan and progress notes for long and short-term goals.  Barriers to Discharge: safety, balance    Possible Resolutions to Barriers:  NMR, vestibular rx if needed    Discharge Planning/Teaching Needs:  Pt will be going home with his wife and extended family at d/c.  Pt's wife will need to attend family education.   Team Discussion:  Team feels pt is functionally doing well.  He continues to have some dizziness and we will continue to monitor his blood pressure.  Pt has been started on anti-vert.  Team also noted that pt's mood is not as good as it was.    Revisions to Treatment Plan:  Pt has mostly supervision goals with some min assist goals for car transfers, stairs, and showering.   Continued Need for Acute Rehabilitation Level of Care: The patient requires daily medical management by a physician with  specialized training in physical medicine and rehabilitation for the following conditions: Daily direction of a multidisciplinary physical rehabilitation program to ensure safe treatment while eliciting the highest outcome that is of practical value to the patient.: Yes Daily medical management of patient stability for increased activity during participation in an intensive rehabilitation regime.: Yes Daily analysis of laboratory values and/or radiology reports with any subsequent need for medication  adjustment of medical intervention for : Neurological problems;Other  Prevatt, Vista Deck 02/07/2013, 2:18 PM

## 2013-02-07 NOTE — Progress Notes (Signed)
Speech Language Pathology Daily Session Note  Patient Details  Name: [name] MRN: [id number] Date of Birth: 1934-02-08  Today's Date: 02/07/2013 Time: 0930-1000 Time Calculation (min): 30 min  Short Term Goals: Week 1: SLP Short Term Goal 1 (Week 1): Pt will utilize speech intelligibility strategies at the sentence level with Mod I.  SLP Short Term Goal 2 (Week 1): Pt will utilize word-finding strategies during functional conversation with Mod I.  SLP Short Term Goal 3 (Week 1): Pt will utilize external memory aids to recall new, daily information with supervision verbal and visual cues.  SLP Short Term Goal 4 (Week 1): Pt will attend to right field of enviornment during functional tasks with Min A verbal and question cues.  SLP Short Term Goal 5 (Week 1): Pt will demonstrate functional problem solving with basic and familair tasks with supervision verbal and question cues.  SLP Short Term Goal 6 (Week 1): Pt will demonstrate selective attention for 30 minutes in a mildly distracting enviornment with supervision verbal cues.   Skilled Therapeutic Interventions: Treatment focus on cognitive goals. SLP facilitated session by providing Max A multimodal cueing for utilization of memory compensatory strategies to increase recall of newly learned information during a structured task.  Pt also required supervision verbal cues for safety during ambulation from SLP office to his room and Mod A question cues for route finding. Continue current plan of care.    FIM:  Comprehension Comprehension Mode: Auditory Comprehension: 5-Understands basic 90% of the time/requires cueing < 10% of the time Expression Expression Mode: Verbal Expression: 5-Expresses basic needs/ideas: With no assist Social Interaction Social Interaction: 6-Interacts appropriately with others with medication or extra time (anti-anxiety, antidepressant). Problem Solving Problem Solving: 4-Solves basic 75 - 89% of the  time/requires cueing 10 - 24% of the time Memory Memory: 3-Recognizes or recalls 50 - 74% of the time/requires cueing 25 - 49% of the time FIM - Eating Eating Activity: 6: More than reasonable amount of time  Pain Pain Assessment Pain Assessment: No/denies pain  Therapy/Group: Individual Therapy  PAYNE, COURTNEY 02/07/2013, 3:06 PM

## 2013-02-07 NOTE — Progress Notes (Signed)
Vista Deck Prevatt, LCSW Social Worker Signed  Patient Care Conference Service date: 02/07/2013 2:18 PM   Inpatient RehabilitationTeam Conference and Plan of Care Update Date: 02/06/2013   Time: 3:00 PM     Patient Name: [name]       Medical Record Number: [id number]   Date of Birth: January 26, 1934 Sex: Male         Room/Bed: 4W19C/4W19C-01 Payor Info: Payor: MEDICARE / Plan: MEDICARE PART A AND B / Product Type: *No Product type* /   Admitting Diagnosis: L CVA   Admit Date/Time:  02/01/2013  5:20 PM Admission Comments: No comment available   Primary Diagnosis:  Embolic cerebral infarction Principal Problem: Embolic cerebral infarction    Patient Active Problem List     Diagnosis  Date Noted   .  Embolic cerebral infarction  40/98/1191   .  CVA (cerebral infarction)  01/31/2013   .  Chest pain  01/29/2013   .  Stenosis of left internal carotid artery     .  Loss of coordination  12/03/2012   .  Obesity  08/20/2011   .  Coronary artery disease     .  Dyslipidemia     .  Hypertension       Expected Discharge Date: Expected Discharge Date: 02/13/13  Team Members Present: Physician leading conference: Dr. Faith Rogue Social Worker Present: Amada Jupiter, LCSW Nurse Present: Carlean Purl, RN PT Present: Zerita Boers, PT OT Present: Ardis Rowan, Corky Crafts, OT SLP Present: Feliberto Gottron, SLP PPS Coordinator present : Tora Duck, RN, CRRN;Becky Henrene Dodge, PT        Current Status/Progress  Goal  Weekly Team Focus   Medical     left thalamic infarct, balance, coordination issues  improve coordination, improve activity tolerance  balance coordination mgt, ego support   Bowel/Bladder     continent bowel/bladder; LBM 11/17 after Sorbitol  to remain continent  monitor for incontinence   Swallow/Nutrition/ Hydration            ADL's     Min A for ADL, Mod A for transfers  Supervision, Min A for transfers  NMR of R-UE, transfers, endurance   Mobility     Min-Mod A   Supervision to Min A at ambulatory level  functional endurance, dynamic standing balance, attending to R UE/LE, motor control in R UE/LE, proprioception   Communication     Supervision  Mod I  word-finding, speech intelligibility   Safety/Cognition/ Behavioral Observations    Min A  Supervision  attention to right field of enviornment, problem solving, memory    Pain     denies any pain  to keep patient pain free or pain <2   continue to monitor for pain and medicate prn   Skin     scattered bruising; laceration to posterior scabbed over  to prevent skin breakdown while on rehab  monitor skin q shift and prn; treat appropriately    Rehab Goals Patient on target to meet rehab goals: Yes Rehab Goals Revised: None *See Care Plan and progress notes for long and short-term goals.    Barriers to Discharge:  safety, balance      Possible Resolutions to Barriers:    NMR, vestibular rx if needed      Discharge Planning/Teaching Needs:    Pt will be going home with his wife and extended family at d/c.   Pt's wife will need to attend family education.    Team Discussion:  Team feels pt is functionally doing well.  He continues to have some dizziness and we will continue to monitor his blood pressure.  Pt has been started on anti-vert.  Team also noted that pt's mood is not as good as it was.     Revisions to Treatment Plan:    Pt has mostly supervision goals with some min assist goals for car transfers, stairs, and showering.    Continued Need for Acute Rehabilitation Level of Care: The patient requires daily medical management by a physician with specialized training in physical medicine and rehabilitation for the following conditions: Daily direction of a multidisciplinary physical rehabilitation program to ensure safe treatment while eliciting the highest outcome that is of practical value to the patient.: Yes Daily medical management of patient stability for increased  activity during participation in an intensive rehabilitation regime.: Yes Daily analysis of laboratory values and/or radiology reports with any subsequent need for medication adjustment of medical intervention for : Neurological problems;Other  Prevatt, Vista Deck 02/07/2013, 2:18 PM

## 2013-02-07 NOTE — Progress Notes (Signed)
Social Work Patient ID: [name], male   DOB: 1933/11/10, 79 y.o.   MRN: [id number]  CSW met with pt and his wife to update them on team conference discussion.  Told them that discharge date was for 02-13-13 and they were pleased about that .  Pt's wife has good family support to help with her respite so that pt will have 24/7 supervision.  Offered for pt to be seen by neuropsychologist and they want to hold off on that as they have a family friend who is a neuropsychologist that they want to use.  CSW told them to let us know by Friday so pt could still be seen on Monday since pt will be d/c'd on Tuesday.  CSW will continue to assist pt/wife closer to d/c.

## 2013-02-07 NOTE — Progress Notes (Signed)
Physical Therapy Session Note  Patient Details  Name: [name] MRN: [id number] Date of Birth: Oct 22, 1933  Today's Date: 02/07/2013 Time: Treatment Session 1: 7829-5621; Treatment Session 2: 3086-5784 Time Calculation (min): Treatment Session 1: 55 min; Treatment Session 2:  Short Term Goals: Week 1:  PT Short Term Goal 1 (Week 1): Pt to perform transfers w/ min guard assis, verbal cues to attend to R UE/LE <50% PT Short Term Goal 2 (Week 1): Pt to propel w/c 150' w/ supervision, verbal cues to attend to R LE <25% PT Short Term Goal 3 (Week 1): Pt to amb 200' w/ RW and min guard assist, verbal/tactile cues for seq and safety <50%  Skilled Therapeutic Interventions/Progress Updates:  Treatment Session 1:  1:1. Pt received sitting in w/c, ready for therapy and wife in room. Focus this session on dynamic standing balance and gait this session. Berg Balance Test performed pt scored 33/56 points and educated on results, that pt remains at high risk for falls. Pt also educated on importance of RW for increased level of independence and decreased risk for falls. In parallel bars focus on dynamic standing balance on complaint surface w/out UE support. Pt challenged by AP, lateral and diagonal weight shifts, static balance w/ EO/EC, static stance w/ perturbations to target proprioception and hip-ankle balance strategy. Pt demonstrating very poor emergent awareness of body positioning initially EC, however, improved with practice. Pt also demonstrating excellent ability to maintain anterior weight shift with dynamic head turns during putting to further target balance and perception. Pt req close (S) to mod A during dynamic balance challenges. Pt verbalized minimal dizziness during transitional movements throughout tx session, consistently decreasing in 5-10seconds. Pt stated that this dizziness feels difference than when he has had episodes of vertigo in the past. Pt able to amb 200'x2 throughout  session w/ close (S) to min guard assist overall.   Treatment Session 2:  1:1. Pt received supine in bed, awake w/ wife in room. Focus this session bed mob, donning shoes and amb w/out RW. Pt demonstrated difficulty performing t/f sup>sit, instructed on log roll technique for increased ease of sidlying>sit. Pt req increased time, but (S) for donning shoes. Trail ambulation w/ HHA to target balance, pt able to amb >250' negotiating various obstacles w/ min-mod A. Pt seated in recliner at end of session, all needs reach and wife in room.   Therapy Documentation Precautions:  Precautions Precautions: Fall Precaution Comments: right sided weakness, decreased fine and gross motor coordination, decreased sensation, decreased proprioception, occasional ballistic movements with RUE Restrictions Weight Bearing Restrictions: No   Pain: Pain Assessment Pain Assessment: No/denies pain Pain Score: 0-No pain    Balance: Balance Balance Assessed: Yes Standardized Balance Assessment Standardized Balance Assessment: Berg Balance Test Berg Balance Test Sit to Stand: Able to stand  independently using hands Standing Unsupported: Able to stand 2 minutes with supervision Sitting with Back Unsupported but Feet Supported on Floor or Stool: Able to sit safely and securely 2 minutes Stand to Sit: Controls descent by using hands Transfers: Able to transfer with verbal cueing and /or supervision Standing Unsupported with Eyes Closed: Able to stand 10 seconds with supervision Standing Ubsupported with Feet Together: Able to place feet together independently and stand for 1 minute with supervision From Standing, Reach Forward with Outstretched Arm: Reaches forward but needs supervision From Standing Position, Pick up Object from Floor: Able to pick up shoe, needs supervision From Standing Position, Turn to Look Behind Over each Shoulder:  Turn sideways only but maintains balance Turn 360 Degrees: Needs close  supervision or verbal cueing Standing Unsupported, Alternately Place Feet on Step/Stool: Needs assistance to keep from falling or unable to try Standing Unsupported, One Foot in Front: Able to plae foot ahead of the other independently and hold 30 seconds Standing on One Leg: Able to lift leg independently and hold equal to or more than 3 seconds Total Score: 33  See FIM for current functional status  Therapy/Group: Individual Therapy  Denzil Hughes 02/07/2013, 12:20 PM

## 2013-02-08 ENCOUNTER — Inpatient Hospital Stay (HOSPITAL_COMMUNITY): Payer: Medicare Other | Admitting: *Deleted

## 2013-02-08 ENCOUNTER — Inpatient Hospital Stay (HOSPITAL_COMMUNITY): Payer: Medicare Other

## 2013-02-08 ENCOUNTER — Inpatient Hospital Stay (HOSPITAL_COMMUNITY): Payer: Medicare Other | Admitting: Speech Pathology

## 2013-02-08 DIAGNOSIS — E1165 Type 2 diabetes mellitus with hyperglycemia: Secondary | ICD-10-CM

## 2013-02-08 DIAGNOSIS — IMO0001 Reserved for inherently not codable concepts without codable children: Secondary | ICD-10-CM

## 2013-02-08 DIAGNOSIS — I6340 Cerebral infarction due to embolism of unspecified cerebral artery: Secondary | ICD-10-CM

## 2013-02-08 DIAGNOSIS — E1142 Type 2 diabetes mellitus with diabetic polyneuropathy: Secondary | ICD-10-CM

## 2013-02-08 DIAGNOSIS — R569 Unspecified convulsions: Secondary | ICD-10-CM

## 2013-02-08 LAB — GLUCOSE, CAPILLARY
Glucose-Capillary: 145 mg/dL — ABNORMAL HIGH (ref 70–99)
Glucose-Capillary: 139 mg/dL — ABNORMAL HIGH (ref 70–99)
Glucose-Capillary: 205 mg/dL — ABNORMAL HIGH (ref 70–99)
Glucose-Capillary: 127 mg/dL — ABNORMAL HIGH (ref 70–99)

## 2013-02-08 LAB — TSH: TSH: 5.645 u[IU]/mL — ABNORMAL HIGH (ref 0.350–4.500)

## 2013-02-08 NOTE — Progress Notes (Signed)
Occupational Therapy Session Note  Patient Details  Name: [name] MRN: [id number] Date of Birth: 1933-05-20  Today's Date: 02/08/2013 Time: 0700-0756 Time Calculation (min): 56 min  Short Term Goals: Week 1:  OT Short Term Goal 1 (Week 1): STG=LTG due to [facility]  Skilled Therapeutic Interventions/Progress Updates:    Pt asleep in bed but easily aroused and sat EOB in preparation for amb with RW to use toilet and transfer to walk-in shower.  Focus on increased attention to right and to right hand when engaging in bathing and dressing tasks.  Pt stated that he had more feeling in his RUE except fingers.  Pt initiated increased use of RUE with controlled movements during bathing and dressing tasks. Focus on functional amb with RW for home mgmt tasks, dynamic standing balance, RUE use for gross and fine motor activities.  Pt issued theraputty with small beads to use in room.  Pt instructed on proper use to facilitate improved fine motor skills.  Therapy Documentation Precautions:  Precautions Precautions: Fall Precaution Comments: right sided weakness, decreased fine and gross motor coordination, decreased sensation, decreased proprioception, occasional ballistic movements with RUE Restrictions Weight Bearing Restrictions: No Pain: Pain Assessment Pain Assessment: No/denies pain  See FIM for current functional status  Therapy/Group: Individual Therapy  Rich Brave 02/08/2013, 7:58 AM

## 2013-02-08 NOTE — Progress Notes (Signed)
Subjective/Complaints: In shower bathing. Sitting on bench. In good spirits. No new complaints.  A 12 point review of systems has been performed and if not noted above is otherwise negative.   Objective: Vital Signs: Blood pressure 121/79, pulse 69, temperature 97.2 F (36.2 C), temperature source Oral, resp. rate 19, height 5\' 9"  (1.753 m), weight 95.6 kg (210 lb 12.2 oz), SpO2 92.00%. No results found. No results found for this basename: WBC, HGB, HCT, PLT,  in the last 72 hours No results found for this basename: NA, K, CL, CO, GLUCOSE, BUN, CREATININE, CALCIUM,  in the last 72 hours CBG (last 3)   Recent Labs  02/07/13 1705 02/07/13 2132 02/08/13 0726  GLUCAP 136* 127* 145*    Wt Readings from Last 3 Encounters:  02/08/13 95.6 kg (210 lb 12.2 oz)  01/30/13 100.789 kg (222 lb 3.2 oz)  01/30/13 100.789 kg (222 lb 3.2 oz)    Physical Exam:  Constitutional: He is oriented to person, place, and time. He appears well-developed and well-nourished.  HENT:  Head: Normocephalic and atraumatic.  Right Ear: External ear normal.  Left Ear: External ear normal.  Eyes: EOM are normal. Right eye exhibits no discharge. Left eye exhibits no discharge.  Neck: Normal range of motion. Neck supple. No JVD present. No tracheal deviation present. No thyromegaly present.  Cardiovascular: Normal rate and regular rhythm.  Respiratory: Effort normal and breath sounds normal. No respiratory distress.  GI: Soft. Bowel sounds are normal. He exhibits no distension. There is no tenderness. There is no rebound.  Musculoskeletal: He exhibits no edema and no tenderness.  Lymphadenopathy:  He has no cervical adenopathy.  Neurological: He is alert and oriented to person, place, and time.  Mild right facial weakness. Decreased sensation around the mouth and tongue  .  RUE 4/5. RLE 4 to 4+/5. Right upper and lower limb ataxia remains.  Speech slurred but improved. Reasonable insight and awareness.   STM  functional.  Skin: Skin is warm and dry.  Psychiatric: He has a normal mood and affect. His behavior is normal. Judgment and thought content normal. Affect a little flat but generally appropriate   Assessment/Plan: 1. Functional deficits secondary to embolic, left PCA/thalamic infarct which require 3+ hours per day of interdisciplinary therapy in a comprehensive inpatient rehab setting. Physiatrist is providing close team supervision and 24 hour management of active medical problems listed below. Physiatrist and rehab team continue to assess barriers to discharge/monitor patient progress toward functional and medical goals. FIM: FIM - Bathing Bathing Steps Patient Completed: Chest;Right Arm;Left Arm;Abdomen;Front perineal area;Buttocks;Right upper leg;Left upper leg;Right lower leg (including foot);Left lower leg (including foot) Bathing: 5: Supervision: Safety issues/verbal cues  FIM - Upper Body Dressing/Undressing Upper body dressing/undressing steps patient completed: Thread/unthread left sleeve of pullover shirt/dress;Thread/unthread right sleeve of pullover shirt/dresss;Put head through opening of pull over shirt/dress;Pull shirt over trunk Upper body dressing/undressing: 5: Supervision: Safety issues/verbal cues FIM - Lower Body Dressing/Undressing Lower body dressing/undressing steps patient completed: Thread/unthread right underwear leg;Thread/unthread left underwear leg;Pull underwear up/down;Thread/unthread right pants leg;Thread/unthread left pants leg;Pull pants up/down;Fasten/unfasten pants;Don/Doff right shoe;Don/Doff left shoe;Fasten/unfasten right shoe;Fasten/unfasten left shoe;Don/Doff right sock;Don/Doff left sock Lower body dressing/undressing: 5: Supervision: Safety issues/verbal cues  FIM - Toileting Toileting steps completed by patient: Adjust clothing prior to toileting;Performs perineal hygiene;Adjust clothing after toileting Toileting Assistive Devices: Grab bar or  rail for support Toileting: 5: Supervision: Safety issues/verbal cues  FIM - Diplomatic Services operational officer Devices: Grab bars;Walker Toilet Transfers: 4-To toilet/BSC:  Min A (steadying Pt. > 75%);4-From toilet/BSC: Min A (steadying Pt. > 75%)  FIM - Bed/Chair Transfer Bed/Chair Transfer Assistive Devices: Arm rests;Walker Bed/Chair Transfer: 4: Bed > Chair or W/C: Min A (steadying Pt. > 75%);4: Chair or W/C > Bed: Min A (steadying Pt. > 75%)  FIM - Locomotion: Wheelchair Distance: 175'x2 Locomotion: Wheelchair: 0: Activity did not occur FIM - Locomotion: Ambulation Locomotion: Ambulation Assistive Devices: Designer, industrial/product Ambulation/Gait Assistance: 4: Min assist Locomotion: Ambulation: 4: Travels 150 ft or more with minimal assistance (Pt.>75%)  Comprehension Comprehension Mode: Auditory Comprehension: 5-Understands basic 90% of the time/requires cueing < 10% of the time  Expression Expression Mode: Verbal Expression: 5-Expresses basic needs/ideas: With no assist  Social Interaction Social Interaction: 6-Interacts appropriately with others with medication or extra time (anti-anxiety, antidepressant).  Problem Solving Problem Solving: 4-Solves basic 75 - 89% of the time/requires cueing 10 - 24% of the time  Memory Memory: 4-Recognizes or recalls 75 - 89% of the time/requires cueing 10 - 24% of the time  Medical Problem List and Plan:  1. Embolic infarcts left PCA distribution, most prominently involving left thalamus after cardiac catheterization  2. DVT Prophylaxis/Anticoagulation: Subcutaneous heparin.   3. Pain Management: Tylenol as needed. No issues at present 4. Neuropsych: This patient is capable of making decisions on his own behalf.   -neuropsych to assess mood  - No antidepressant at present. Feels he's doing a little better 5. CAD status post catheterization 01/30/2013. Followup cardiac services. Continue medical management with Plavix therapy  6.  Seizure disorder. Tegretol 300 mg twice a day, Valium 2 mg twice a day.   7. Hypertension. Lopressor 50 mg twice a day, Cozaar 50 mg twice a day, Imdur 30 mg daily, hydrochlorothiazide 25 mg daily, clonidine 0.2 mg daily. Monitor with increased mobility  8. Diabetes mellitus with peripheral neuropathy. Hemoglobin A1c 6.5. Sliding scale insulin her present. Check CBGs a.c. and at bedtime. Patient on Glucophage 500 mg twice a day prior to admission  .   -reasonable control at present 9. Hypothyroidism. Synthroid --check TSH 10. BPH. Flomax.  Voiding thus far.   11. Hyperlipidemia. Lipitor 12. Dizziness---hx of BPPV, obviously may be central related to stroke, episodes appear fairly limited  -added prn antivert   -some improvement  LOS (Days) 7 A FACE TO FACE EVALUATION WAS PERFORMED  SWARTZ,ZACHARY T 02/08/2013 8:47 AM

## 2013-02-08 NOTE — Progress Notes (Signed)
Speech Language Pathology Daily Session Note  Patient Details  Name: [name] MRN: [id number] Date of Birth: [birth year]  Today's Date: 02/08/2013 Time: 1000-1045 Time Calculation (min): 45 min  Short Term Goals: Week 1: SLP Short Term Goal 1 (Week 1): Pt will utilize speech intelligibility strategies at the sentence level with Mod I.  SLP Short Term Goal 2 (Week 1): Pt will utilize word-finding strategies during functional conversation with Mod I.  SLP Short Term Goal 3 (Week 1): Pt will utilize external memory aids to recall new, daily information with supervision verbal and visual cues.  SLP Short Term Goal 4 (Week 1): Pt will attend to right field of enviornment during functional tasks with Min A verbal and question cues.  SLP Short Term Goal 5 (Week 1): Pt will demonstrate functional problem solving with basic and familair tasks with supervision verbal and question cues.  SLP Short Term Goal 6 (Week 1): Pt will demonstrate selective attention for 30 minutes in a mildly distracting enviornment with supervision verbal cues.   Skilled Therapeutic Interventions: Treatment focus on cognitive goals. Pt participated in a mildly complex, new learning task and required supervision question cues to recall rules of task and Min verbal cues for functional problem solving and organization throughout the session. Pt required supervision verbal cues for safety with ambulation with RW and for path finding back to his room. Continue current plan of care.    FIM:  Comprehension Comprehension Mode: Auditory Comprehension: 5-Understands complex 90% of the time/Cues < 10% of the time Expression Expression: 5-Expresses complex 90% of the time/cues < 10% of the time Social Interaction Social Interaction: 6-Interacts appropriately with others with medication or extra time (anti-anxiety, antidepressant). Problem Solving Problem Solving: 5-Solves basic 90% of the time/requires cueing < 10% of the  time Memory Memory: 4-Recognizes or recalls 75 - 89% of the time/requires cueing 10 - 24% of the time  Pain Pain Assessment Pain Assessment: No/denies pain Pain Score: 0-No pain  Therapy/Group: Individual Therapy  PAYNE, COURTNEY 02/08/2013, 4:43 PM

## 2013-02-08 NOTE — Progress Notes (Signed)
Physical Therapy Session Note  Patient Details  Name: [name] MRN: [id number] Date of Birth: 04/10/1933  Today's Date: 02/08/2013 Time: 1405-1430 Time Calculation (min): 25 min  Short Term Goals: Week 1:  PT Short Term Goal 1 (Week 1): Pt to perform transfers w/ min guard assis, verbal cues to attend to R UE/LE <50% PT Short Term Goal 2 (Week 1): Pt to propel w/c 150' w/ supervision, verbal cues to attend to R LE <25% PT Short Term Goal 3 (Week 1): Pt to amb 200' w/ RW and min guard assist, verbal/tactile cues for seq and safety <50%  Skilled Therapeutic Interventions/Progress Updates:    Patient received supine in bed, asleep, but arousable and agreeable to therapy; wife present. Session focused on gait training and standing dynamic balance. Supine>sit with use of bedrails from flat bed and supervision. Patient instructed in gait training160' x1 with RW and min guard, verbal cues to maintain BOS within RW. In standing, patient performed 2 sets 20 reps of alternating toe taps on 6 inch step with minA. Patient performed gait training 85' x1 with L HHA and minA, demonstrating increased lateral trunk sway without AD. Patient then performed gait training 75' x1 without AD or HHA with minA. Patient left seated in wheelchair with all needs within reach and wife present.  Therapy Documentation Precautions:  Precautions Precautions: Fall Precaution Comments: right sided weakness, decreased fine and gross motor coordination, decreased sensation, decreased proprioception, occasional ballistic movements with RUE Restrictions Weight Bearing Restrictions: No Pain: Pain Assessment Pain Assessment: No/denies pain Pain Score: 0-No pain Locomotion : Ambulation Ambulation/Gait Assistance: 4: Min assist   See FIM for current functional status  Therapy/Group: Individual Therapy  Chipper Herb. Ripa, PT, DPT 02/08/2013, 4:03 PM

## 2013-02-08 NOTE — Progress Notes (Signed)
Physical Therapy Session Note  Patient Details  Name: [name] MRN: [id number] Date of Birth: 1933/09/11  Today's Date: 02/08/2013 Time: 4098-1191 Time Calculation (min): 55 min  Short Term Goals: Week 1:  PT Short Term Goal 1 (Week 1): Pt to perform transfers w/ min guard assis, verbal cues to attend to R UE/LE <50% PT Short Term Goal 2 (Week 1): Pt to propel w/c 150' w/ supervision, verbal cues to attend to R LE <25% PT Short Term Goal 3 (Week 1): Pt to amb 200' w/ RW and min guard assist, verbal/tactile cues for seq and safety <50%  Skilled Therapeutic Interventions/Progress Updates:  1:1. Pt received sitting in w/c w/ RN in room providing meds. Focus this session on balance and endurance during amb, furniture transfers, dynamic standing balance, therex and pathfinding. Pt able to amb >200'x4 this session w/ L HHA- no AD to target balance and ambulatory endurance. In solarium, practiced multiple furniture transfers to surfaces of various heights and arm rest availability to target safety and consistency during t/f sit<>stand w/out RW. Pt encouraged to use R UE for management of all doors, elevator buttons and chairs to target coordination and perception. Pt demonstrating difficulty w/ pathfinding activity back to unit only able to recall 1/3 location variables, mod-max cueing overall. Pt's dynamic standing balance challenged w/ horseshoe game while standing on compliant surface w/out UE support, emphasis on controlled R weight shift and reaching to R side for management of horseshoes w/ R UE. Pt demostrating posterior weight shift throughout activity, tactile cueing for anterior weight shift. Use of NuStep at end of session for functional endurance, level 5x28min and excellent tolerance. Pt req min A-occasional mod A throughout all activities this tx session. Pt sitting in recliner at end of session w/ all needs in reach and wife in room.   Therapy Documentation Precautions:   Precautions Precautions: Fall Precaution Comments: right sided weakness, decreased fine and gross motor coordination, decreased sensation, decreased proprioception, occasional ballistic movements with RUE Restrictions Weight Bearing Restrictions: No Pain: Pain Assessment Pain Assessment: No/denies pain  See FIM for current functional status  Therapy/Group: Individual Therapy  Denzil Hughes 02/08/2013, 11:43 AM

## 2013-02-09 ENCOUNTER — Inpatient Hospital Stay (HOSPITAL_COMMUNITY): Payer: Medicare Other

## 2013-02-09 ENCOUNTER — Encounter (HOSPITAL_COMMUNITY): Payer: Medicare Other

## 2013-02-09 ENCOUNTER — Inpatient Hospital Stay (HOSPITAL_COMMUNITY): Payer: Medicare Other | Admitting: Speech Pathology

## 2013-02-09 LAB — GLUCOSE, CAPILLARY
Glucose-Capillary: 150 mg/dL — ABNORMAL HIGH (ref 70–99)
Glucose-Capillary: 150 mg/dL — ABNORMAL HIGH (ref 70–99)
Glucose-Capillary: 134 mg/dL — ABNORMAL HIGH (ref 70–99)
Glucose-Capillary: 100 mg/dL — ABNORMAL HIGH (ref 70–99)

## 2013-02-09 MED ORDER — METFORMIN HCL 500 MG PO TABS
500.0000 mg | ORAL_TABLET | Freq: Two times a day (BID) | ORAL | Status: DC
  Administered 2013-02-09 – 2013-02-13 (×8): 500 mg via ORAL
  Filled 2013-02-09 (×10): qty 1

## 2013-02-09 NOTE — Progress Notes (Signed)
Subjective/Complaints: Up EOB. Feeling fairly well. Some dizziness when he first gets up in the morning.  A 12 point review of systems has been performed and if not noted above is otherwise negative.   Objective: Vital Signs: Blood pressure 137/79, pulse 77, temperature 96.8 F (36 C), temperature source Oral, resp. rate 18, height 5\' 9"  (1.753 m), weight 95.6 kg (210 lb 12.2 oz), SpO2 92.00%. No results found. No results found for this basename: WBC, HGB, HCT, PLT,  in the last 72 hours No results found for this basename: NA, K, CL, CO, GLUCOSE, BUN, CREATININE, CALCIUM,  in the last 72 hours CBG (last 3)   Recent Labs  02/08/13 1629 02/08/13 2104 02/09/13 0754  GLUCAP 205* 127* 150*    Wt Readings from Last 3 Encounters:  02/08/13 95.6 kg (210 lb 12.2 oz)  01/30/13 100.789 kg (222 lb 3.2 oz)  01/30/13 100.789 kg (222 lb 3.2 oz)    Physical Exam:  Constitutional: He is oriented to person, place, and time. He appears well-developed and well-nourished.  HENT:  Head: Normocephalic and atraumatic.  Right Ear: External ear normal.  Left Ear: External ear normal.  Eyes: EOM are normal. Right eye exhibits no discharge. Left eye exhibits no discharge.  Neck: Normal range of motion. Neck supple. No JVD present. No tracheal deviation present. No thyromegaly present.  Cardiovascular: Normal rate and regular rhythm.  Respiratory: Effort normal and breath sounds normal. No respiratory distress.  GI: Soft. Bowel sounds are normal. He exhibits no distension. There is no tenderness. There is no rebound.  Musculoskeletal: He exhibits no edema and no tenderness.  Lymphadenopathy:  He has no cervical adenopathy.  Neurological: He is alert and oriented to person, place, and time. i saw no nystagmus Mild right facial weakness. Decreased sensation around the mouth and tongue  .  RUE 4/5. RLE 4 to 4+/5. Right upper and lower limb ataxia remains.  Speech slurred but improved. Reasonable  insight and awareness.   STM functional.  Skin: Skin is warm and dry.  Psychiatric: He has a normal mood and affect. His behavior is normal. Judgment and thought content normal. Affect a little flat still but he remains pleasant  Assessment/Plan: 1. Functional deficits secondary to embolic, left PCA/thalamic infarct which require 3+ hours per day of interdisciplinary therapy in a comprehensive inpatient rehab setting. Physiatrist is providing close team supervision and 24 hour management of active medical problems listed below. Physiatrist and rehab team continue to assess barriers to discharge/monitor patient progress toward functional and medical goals. FIM: FIM - Bathing Bathing Steps Patient Completed: Chest;Right Arm;Left Arm;Abdomen;Front perineal area;Buttocks;Right upper leg;Left upper leg;Right lower leg (including foot);Left lower leg (including foot) Bathing: 5: Supervision: Safety issues/verbal cues  FIM - Upper Body Dressing/Undressing Upper body dressing/undressing steps patient completed: Thread/unthread left sleeve of pullover shirt/dress;Thread/unthread right sleeve of pullover shirt/dresss;Put head through opening of pull over shirt/dress;Pull shirt over trunk Upper body dressing/undressing: 5: Supervision: Safety issues/verbal cues FIM - Lower Body Dressing/Undressing Lower body dressing/undressing steps patient completed: Thread/unthread right underwear leg;Thread/unthread left underwear leg;Pull underwear up/down;Thread/unthread right pants leg;Thread/unthread left pants leg;Pull pants up/down;Fasten/unfasten pants;Don/Doff right shoe;Don/Doff left shoe;Fasten/unfasten right shoe;Fasten/unfasten left shoe;Don/Doff right sock;Don/Doff left sock Lower body dressing/undressing: 5: Supervision: Safety issues/verbal cues  FIM - Toileting Toileting steps completed by patient: Adjust clothing prior to toileting;Performs perineal hygiene;Adjust clothing after toileting Toileting  Assistive Devices: Grab bar or rail for support Toileting: 5: Supervision: Safety issues/verbal cues  FIM - Diplomatic Services operational officer  Devices: Grab bars;Walker Toilet Transfers: 4-To toilet/BSC: Min A (steadying Pt. > 75%);4-From toilet/BSC: Min A (steadying Pt. > 75%)  FIM - Bed/Chair Transfer Bed/Chair Transfer Assistive Devices: Arm rests;Bed rails Bed/Chair Transfer: 4: Bed > Chair or W/C: Min A (steadying Pt. > 75%);4: Chair or W/C > Bed: Min A (steadying Pt. > 75%);5: Supine > Sit: Supervision (verbal cues/safety issues)  FIM - Locomotion: Wheelchair Distance: 175'x2 Locomotion: Wheelchair: 0: Activity did not occur FIM - Locomotion: Ambulation Locomotion: Ambulation Assistive Devices: Designer, industrial/product Ambulation/Gait Assistance: 4: Min assist Locomotion: Ambulation: 4: Travels 150 ft or more with minimal assistance (Pt.>75%)  Comprehension Comprehension Mode: Auditory Comprehension: 5-Understands basic 90% of the time/requires cueing < 10% of the time  Expression Expression Mode: Verbal Expression: 5-Expresses complex 90% of the time/cues < 10% of the time  Social Interaction Social Interaction: 6-Interacts appropriately with others with medication or extra time (anti-anxiety, antidepressant).  Problem Solving Problem Solving: 5-Solves complex 90% of the time/cues < 10% of the time  Memory Memory: 4-Recognizes or recalls 75 - 89% of the time/requires cueing 10 - 24% of the time  Medical Problem List and Plan:  1. Embolic infarcts left PCA distribution, most prominently involving left thalamus after cardiac catheterization  2. DVT Prophylaxis/Anticoagulation: Subcutaneous heparin.   3. Pain Management: Tylenol as needed. No issues at present 4. Neuropsych: This patient is capable of making decisions on his own behalf.   -neuropsych assessment  - No antidepressant at present. Mood cycles a bit. Doesn't want medication for it at present---continue to  watch 5. CAD status post catheterization 01/30/2013. Followup cardiac services. Continue medical management with Plavix therapy  6. Seizure disorder. Tegretol 300 mg twice a day, Valium 2 mg twice a day.   7. Hypertension. Lopressor 50 mg twice a day, Cozaar 50 mg twice a day, Imdur 30 mg daily, hydrochlorothiazide 25 mg daily, clonidine 0.2 mg daily. Monitor with increased mobility  8. Diabetes mellitus with peripheral neuropathy. Hemoglobin A1c 6.5. Sliding scale insulin her present. Check CBGs a.c. and at bedtime. Patient on Glucophage 500 mg twice a day prior to admission--resume today .   -reasonable control at present 9. Hypothyroidism. Synthroid --check TSH 10. BPH. Flomax.  Voiding thus far.   11. Hyperlipidemia. Lipitor 12. Dizziness---hx of BPPV, obviously may be central related to stroke, episodes appear fairly limited  -added prn antivert   -some improvement  LOS (Days) 8 A FACE TO FACE EVALUATION WAS PERFORMED  SWARTZ,ZACHARY T 02/09/2013 8:58 AM

## 2013-02-09 NOTE — Progress Notes (Signed)
Occupational Therapy Session Note  Patient Details  Name: [name] MRN: [id number] Date of Birth: April 30, 1933  Today's Date: 02/09/2013 Time: 0700-0755 Time Calculation (min): 55 min  Short Term Goals: Week 1:  OT Short Term Goal 1 (Week 1): STG=LTG due to ESLOS  Skilled Therapeutic Interventions/Progress Updates:    Pt engaged in ADL retraining including bathing at shower level and dressing with sit<>stand from chair.  Focus on increased attention to right environment and RUE during functional tasks.  Pt exhibits increased coordination with bathing and dressing tasks but continues to drop wash cloth when not attending to right hand.  Pt continues to required min verbal cues for safety awareness primarily with sit<>stand and when ambulating in cluttered environment.  Pt states he continues to experience decreased sensation in right hand and right side of mouth.  Therapy Documentation Precautions:  Precautions Precautions: Fall Precaution Comments: right sided weakness, decreased fine and gross motor coordination, decreased sensation, decreased proprioception, occasional ballistic movements with RUE Restrictions Weight Bearing Restrictions: No Pain: Pain Assessment Pain Assessment: No/denies pain  See FIM for current functional status  Therapy/Group: Individual Therapy  Rich Brave 02/09/2013, 7:55 AM

## 2013-02-09 NOTE — Progress Notes (Signed)
Physical Therapy Weekly Progress Note  Patient Details  Name: [name] MRN: [id number] Date of Birth: 11-01-1933  Today's Date: 02/09/2013 Time: Treatment Session 1: 4098-1191; Treatment Session 2: 1335-1400 Time Calculation (min): Treatment Session 1: ; Treatment Session 2:  Pt continues to be very motivated to participate in physical therapy and make excellent progress towards all goals. Patient has met 2 of 2 short term goals with continued progress towards respective long term goals. Third short term goal regarding w/c propulsion discontinued as ambulation is pt's primary means of mobility. Pt's long term goals upgraded due to recent progress regarding functional endurance, balance, awareness, coordination and proprioception of R UE/LE therefore requiring decreased need for physical assist and verbal cueing for safety during functional mobility and tasks.   Patient continues to demonstrate the following deficits: decreased functional endurance, decreased balance, decreased strength, decreased proprioception, decreased attention, decreased awareness, decreased coordination, R inattention and therefore will continue to benefit from skilled PT intervention to enhance overall performance with the identified impairments to maximize level of independence and decrease risk for falls.   Patient progressing toward long term goals..  Continue plan of care.  PT Short Term Goals Week 1:  PT Short Term Goal 1 (Week 1): Pt to perform transfers w/ min guard assis, verbal cues to attend to R UE/LE <50% PT Short Term Goal 1 - Progress (Week 1): Met PT Short Term Goal 2 (Week 1): Pt to propel w/c 150' w/ supervision, verbal cues to attend to R LE <25% PT Short Term Goal 2 - Progress (Week 1): Discontinued (comment) (Ambulation is pt's primary means of mobility) PT Short Term Goal 3 (Week 1): Pt to amb 200' w/ RW and min guard assist, verbal/tactile cues for seq and safety <50% PT Short Term  Goal 3 - Progress (Week 1): Met Week 2:  PT Short Term Goal 1 (Week 2): STGs=LTGs due to anticipated LOS  Skilled Therapeutic Interventions/Progress Updates:  Treatment Session 1:  1:1. Pt received sitting in w/c, ready for therapy. Focus this session on ambulatory endurance, trial various ADs, memory, pathfinding and dynamic balance/B UE coordination. Pt able to amb 150' w/out AD and min guard assist room>thearpy gym. Trial this session WBQC vs. SPC x150' each, close (S) for both. However, pt demonstrating increased fluidity and more normalized gait pattern w/ SPC. Pt able to amb >500' on/off unit in busy community environments w/ [facility] and close (S) to min guard assist for pathfinding task therapy gym<>main enterance Intermittent min verbal cues to attend to environment on R side. Pt demonstrating 4 episodes of mild LOB w/ ability to self correct, req min A by therapist for 1 LOB correction. Pt req mod verbal cueing for attending to signs and difficulty remembering start/finish locations. Following activity, pt verbalized difficulty attending to signs 2/2 distraction in environments. Pt also stated that he felt like he had mild difficulty with directions prior to his stroke. Pt req min A for dynamic balance challenging of walking and bouncing or tossing ball to self. Pt unable to sequencing alternating between two while amb, only during static stance. Pt sitting in recliner at end of session w/ all needs in reach and wife in room.     Treatment Session 2:  1:1. Pt received sitting in recliner, ready for therapy. Pt verbalized increased pain in R knee this afternoon vs. Morning session. RN made aware. Encouraged use of RW vs. SPC due to increased pain in R knee and for increased safety. Focus  this session on functional endurance. Pt able to amb 175'x2 room<>therapy gym w/ RW and close(S), min cues for management of RW. Pt performed NuStep, level 3x59min, for endurance. Pt w/ good excellent tolerance this  session. Pt sitting in w/c at end of session w/ all needs in reach.   Therapy Documentation Precautions:  Precautions Precautions: Fall Precaution Comments: right sided weakness, decreased fine and gross motor coordination, decreased sensation, decreased proprioception, occasional ballistic movements with RUE Restrictions Weight Bearing Restrictions: No Pain: Pain Assessment Pain Assessment: 0-10 Pain Score: 3  Pain Location: Knee Pain Orientation: Right Pain Descriptors / Indicators: Aching Pain Intervention(s): RN made aware  See FIM for current functional status  Therapy/Group: Individual Therapy  Denzil Hughes 02/09/2013, 1:51 PM

## 2013-02-09 NOTE — Progress Notes (Signed)
Speech Language Pathology Weekly Progress & Session Notes  Patient Details  Name: [name] MRN: [id number] Date of Birth: 1933-12-06  Today's Date: 02/09/2013 Time: 4696-2952 Time Calculation (min): 45 min  Short Term Goals: Week 1: SLP Short Term Goal 1 (Week 1): Pt will utilize speech intelligibility strategies at the sentence level with Mod I.  SLP Short Term Goal 1 - Progress (Week 1): Met SLP Short Term Goal 2 (Week 1): Pt will utilize word-finding strategies during functional conversation with Mod I.  SLP Short Term Goal 2 - Progress (Week 1): Met SLP Short Term Goal 3 (Week 1): Pt will utilize external memory aids to recall new, daily information with supervision verbal and visual cues.  SLP Short Term Goal 3 - Progress (Week 1): Not met SLP Short Term Goal 4 (Week 1): Pt will attend to right field of enviornment during functional tasks with Min A verbal and question cues.  SLP Short Term Goal 4 - Progress (Week 1): Met SLP Short Term Goal 5 (Week 1): Pt will demonstrate functional problem solving with basic and familair tasks with supervision verbal and question cues.  SLP Short Term Goal 5 - Progress (Week 1): Met SLP Short Term Goal 6 (Week 1): Pt will demonstrate selective attention for 30 minutes in a mildly distracting enviornment with supervision verbal cues.  SLP Short Term Goal 6 - Progress (Week 1): Met  New Short-Term Goals:  Week 2: SLP Short Term Goal 1 (Week 2): Pt will utilize external memory aids to recall new, daily information with supervision verbal and visual cues.  SLP Short Term Goal 2 (Week 2): Pt will demonstrate alternating attention between two task for 30 minutes with supervision verbal cues.  SLP Short Term Goal 3 (Week 2): Pt will demonstrate functional problem solving with basic and familair tasks with Mod I. SLP Short Term Goal 4 (Week 2): Pt will attend to right field of enviornment during functional tasks with supervision verbal and question  cues.   Weekly Progress Updates: Pt has met 5 of 6 STG's this reporting period due to improvements in speech intelligibility, word-finding, selective attention, attention to right field of environment and functional problem solving. Pt is Mod I for word-finding and speech intelligibility at the conversation level and requires supervision question and verbal cues for selective attention, attention right-field of environment and functional problem solving. Pt continues to require Min A for recall of newly learned information and utilization of memory compensatory strategies. Pt/family education ongoing.   Pt would benefit from continued skilled SLP intervention to maximize cognitive function and overall functional independence.    SLP Intensity: Minumum of 1-2 x/day, 30 to 90 minutes SLP Frequency: 5 out of 7 days SLP Duration/Estimated Length of Stay: 02/13/13 SLP Treatment/Interventions: Cognitive remediation/compensation;Cueing hierarchy;Functional tasks;Patient/family education;Therapeutic Activities;Speech/Language facilitation;Internal/external aids;Environmental controls  Daily Session Skilled Therapeutic Intervention: Treatment focus on cognitive goals. SLP facilitated session with Min A verbal, visual, question cues for functional problem solving/scanning during calendar making task.  Pt read information with extra time and supervision question cues to self-monitor and correct errors due to decreased vision and Min A question cues to recall new information read and carryover to transfer to calendar.  Pt ambulated with RW with supervision verbal cues for safety awareness and for path finding.  FIM:  Comprehension Comprehension Mode: Auditory Comprehension: 5-Follows basic conversation/direction: With extra time/assistive device Expression Expression Mode: Verbal Expression: 5-Expresses basic needs/ideas: With no assist Social Interaction Social Interaction: 6-Interacts appropriately with  others with  medication or extra time (anti-anxiety, antidepressant). Problem Solving Problem Solving: 5-Solves complex 90% of the time/cues < 10% of the time Memory Memory: 4-Recognizes or recalls 75 - 89% of the time/requires cueing 10 - 24% of the time FIM - Eating Eating Activity: 6: More than reasonable amount of time Pain Pain Assessment Pain Assessment: No/denies pain  Therapy/Group: Individual Therapy  PAYNE, COURTNEY 02/09/2013, 11:31 AM

## 2013-02-09 NOTE — Progress Notes (Signed)
Occupational Therapy Weekly Progress Note  Patient Details  Name: [name] MRN: [id number] Date of Birth: September 24, 1933  Today's Date: 02/09/2013  Pt has made steady progress with BADLs during this admission.  Pt continues to exhibit decreased sensation in RUE although pt is able to use his RUE functionally during bathing and dressing tasks.  Pt requires min verbal cues for safety awareness with functional ambulation for home mgmt tasks.  Pt's wife has been present for therapy sessions but has not actively participated in sessions.    Patient continues to demonstrate the following deficits: impaired motor control and sensation of right upper extremity, decreased safety awareness and thoroughness during ADL  and therefore will continue to benefit from skilled OT intervention to enhance overall performance with BADL and iADL.  Patient progressing toward long term goals..  Continue plan of care.  OT Short Term Goals Week 2:  OT Short Term Goal 2: STG=LTG due to ESLOS     Therapy Documentation Precautions:  Precautions Precautions: Fall Precaution Comments: right sided weakness, decreased fine and gross motor coordination, decreased sensation, decreased proprioception, occasional ballistic movements with RUE Restrictions Weight Bearing Restrictions: No  See FIM for current functional status  Therapy/Group: Individual Therapy  BARTHOLD,FRANK 02/09/2013, 3:33 PM

## 2013-02-10 ENCOUNTER — Inpatient Hospital Stay (HOSPITAL_COMMUNITY): Payer: Medicare Other | Admitting: Occupational Therapy

## 2013-02-10 ENCOUNTER — Inpatient Hospital Stay (HOSPITAL_COMMUNITY): Payer: Medicare Other | Admitting: Physical Therapy

## 2013-02-10 DIAGNOSIS — I6340 Cerebral infarction due to embolism of unspecified cerebral artery: Secondary | ICD-10-CM

## 2013-02-10 LAB — GLUCOSE, CAPILLARY
Glucose-Capillary: 123 mg/dL — ABNORMAL HIGH (ref 70–99)
Glucose-Capillary: 88 mg/dL (ref 70–99)
Glucose-Capillary: 136 mg/dL — ABNORMAL HIGH (ref 70–99)
Glucose-Capillary: 97 mg/dL (ref 70–99)

## 2013-02-10 NOTE — Progress Notes (Signed)
Patient ID: [name], male   DOB: May 04, 1933, 79 y.o.   MRN: [id number] [name] is a 79 y.o. male May 14, 1933 [id number]  Subjective: No new complaints. No new problems. Slept well. Feeling OK.  Objective: Vital signs in last 24 hours: Temp:  [97.7 F (36.5 C)-98.2 F (36.8 C)] 97.7 F (36.5 C) (11/22 0500) Pulse Rate:  [73-80] 80 (11/22 0500) Resp:  [18-19] 19 (11/22 0500) BP: (128-142)/(75-83) 142/83 mmHg (11/22 0500) SpO2:  [95 %-96 %] 96 % (11/22 0500) Weight:  [95.4 kg (210 lb 5.1 oz)] 95.4 kg (210 lb 5.1 oz) (11/22 0500) Weight change:  Last BM Date: 02/09/13  Intake/Output from previous day: 11/21 0701 - 11/22 0700 In: 1080 [P.O.:1080] Out: -   Physical Exam General: No apparent distress   Wife at side Lungs: Normal effort. Lungs clear to auscultation, no crackles or wheezes. Cardiovascular: Regular rate and rhythm, no edema  Lab Results: BMET    Component Value Date/Time   NA 137 02/02/2013 0600   K 3.8 02/02/2013 0600   CL 99 02/02/2013 0600   CO2 29 02/02/2013 0600   GLUCOSE 128* 02/02/2013 0600   BUN 11 02/02/2013 0600   CREATININE 0.88 02/02/2013 0600   CREATININE 0.97 07/19/2012 0830   CALCIUM 8.7 02/02/2013 0600   GFRNONAA 81* 02/02/2013 0600   GFRAA >90 02/02/2013 0600   CBC    Component Value Date/Time   WBC 8.8 02/02/2013 0600   RBC 4.18* 02/02/2013 0600   HGB 12.6* 02/02/2013 0600   HCT 37.9* 02/02/2013 0600   PLT 239 02/02/2013 0600   MCV 90.7 02/02/2013 0600   MCH 30.1 02/02/2013 0600   MCHC 33.2 02/02/2013 0600   RDW 14.1 02/02/2013 0600   LYMPHSABS 2.1 02/02/2013 0600   MONOABS 1.0 02/02/2013 0600   EOSABS 0.5 02/02/2013 0600   BASOSABS 0.0 02/02/2013 0600   CBG's (last 3):   Recent Labs  02/09/13 1651 02/09/13 2143 02/10/13 0743  GLUCAP 134* 100* 123*   LFT's Lab Results  Component Value Date   ALT 25 02/02/2013   AST 28 02/02/2013   ALKPHOS 75 02/02/2013   BILITOT 0.3 02/02/2013    Studies/Results: No  results found.  Medications:  I have reviewed the patient's current medications. Scheduled Medications: . atorvastatin  80 mg Oral Daily  . carbamazepine  300 mg Oral BID  . cloNIDine  0.2 mg Oral Daily  . clopidogrel  75 mg Oral Daily  . diazepam  2 mg Oral BID  . heparin  5,000 Units Subcutaneous Q8H  . hydrochlorothiazide  25 mg Oral Daily  . insulin aspart  0-9 Units Subcutaneous TID WC  . isosorbide mononitrate  30 mg Oral Daily  . levothyroxine  50 mcg Oral QAC breakfast  . losartan  50 mg Oral BID  . metFORMIN  500 mg Oral BID WC  . metoprolol  50 mg Oral BID  . pantoprazole  40 mg Oral Daily  . polyethylene glycol  17 g Oral Daily  . senna  2 tablet Oral Daily  . tamsulosin  0.4 mg Oral QPC breakfast   PRN Medications: acetaminophen, bisacodyl, fluticasone, meclizine, nitroGLYCERIN, ondansetron (ZOFRAN) IV, ondansetron, sorbitol  Assessment/Plan: Principal Problem:   Embolic cerebral infarction 1. Embolic infarcts left PCA distribution, most prominently involving left thalamus after cardiac catheterization  2. DVT Prophylaxis/Anticoagulation: Subcutaneous heparin.  3. Pain Management: Tylenol as needed. No issues at present  4. Neuropsych: This patient is capable of making decisions on his own  behalf.  -neuropsych assessment :Mood cycles a bit. Doesn't want medication for same at present---continue to watch  5. CAD status post catheterization 01/30/2013. Followup cardiac services. Continue medical management with Plavix therapy  6. Seizure disorder. Tegretol 300 mg twice a day, Valium 2 mg twice a day.  7. Hypertension. Lopressor 50 mg twice a day, Cozaar 50 mg twice a day, Imdur 30 mg daily, hydrochlorothiazide 25 mg daily, clonidine 0.2 mg daily. Monitor with increased mobility  8. Diabetes mellitus with peripheral neuropathy. Hemoglobin A1c 6.5. Sliding scale insulin her present. Check CBGs a.c. and at bedtime. Patient on Glucophage 500 mg twice a day prior to  admission--resume today .  -reasonable control at present  9. Hypothyroidism. Synthroid --check TSH  10. BPH. Flomax. Voiding thus far.  11. Hyperlipidemia. Lipitor  12. Dizziness---hx of BPPV, obviously may be central related to stroke, episodes appear fairly limited  -continue prn antivert and supportive care   Length of stay, days: 9   Valerie A. Felicity Coyer, MD 02/10/2013, 9:56 AM

## 2013-02-10 NOTE — Progress Notes (Signed)
Occupational Therapy Session Note  Patient Details  Name: [name] MRN: [id number] Date of Birth: 03/24/1933  Today's Date: 02/10/2013 Time: 7829-5621 Time Calculation (min): 30 min   Skilled Therapeutic Interventions/Progress Updates:    Patient seen this am for 1:1 OT session to address functional mobility, and functional use of right upper extremity.  Patient ambulated to gym with min assist and no device.  Patient with difficulty grading movement in right lower extremity, although did better when activity slowed.  Worked in [employer] / unweighting right extremities.  Patient with no difficulty coordinating inter-limb joints during closed chain activity.  Followed with open chain fine motor coordination tasks, dealing, shuffling cards with min cueing to utilize vision to compensate for decreased sensation.  Patient needs to overcompensate to effectively use his vision for such tasks.  Also cueing on effective hand placement, e.g. Thumb on top and index finger underneath for an effective pinch pattern with card.  Continued to increase challenge by adding fine and gross motor tasks during standing and stepping activities.   Patient left in gym with Physical Therapist.    Therapy Documentation Precautions:  Precautions Precautions: Fall Precaution Comments: right sided weakness, decreased fine and gross motor coordination, decreased sensation, decreased proprioception, occasional ballistic movements with RUE Restrictions Weight Bearing Restrictions: No Pain:  No pain reported:    See FIM for current functional status  Therapy/Group: Individual Therapy  Collier Salina 02/10/2013, 10:07 AM

## 2013-02-10 NOTE — Progress Notes (Signed)
Physical Therapy Session Note  Patient Details  Name: [name] MRN: [id number] Date of Birth: 1933/07/09  Today's Date: 02/10/2013 Time: 1005-1035 Time Calculation (min): 30 min  Skilled Therapeutic Interventions/Progress Updates:    Pt received seated in gym having just completed OT session. Pt reports no pain and is agreeable to session. Session focused on increasing pt independence with sit<>stand transfers, dynamic standing balance, RLE NMR, and gait with/without SPC. Graded sit<>stand from EOM with verbal explanation, PT demonstration of safe/effective setup/body mechanics with sit<>stand transfer. Pt subsequently performed multiple sit<>stands with multimodal cues to facilitate anterior pelvic tilt, anterior weight shifting with consistent within-session carryover. Dynamic standing balance: ring toss x 6.5 minutes with SBA/min guard for balance. Ankle strategy present but delayed during activity. Supine RLE PNF: D2 flexion/extension x3 reps rhythmic initiation, 2x10 reps resisted; focus on R ankle dorsiflexion with concurrent R hip/knee flexion. Multiple 25' gait trials with min guard-min A for stability/balance. Pt exhibits LLE>RLE step velocity causing incoordinated gait pattern. Attempted to utilize metronome to equalize bilateral step speed; however, pt with minimal carryover or improvement in gait coordination. Gait 3x150' with SPC, min guard-min A for stability/balance. Pt required PT demonstration for correct sequencing of SPC with gait; however, seemed to respond most favorably to verbal cueing to walk naturally. Session ended in pt room, where pt was seated in bedside chair with family present and all needs within reach.  Therapy Documentation Precautions:  Precautions Precautions: Fall Precaution Comments: right sided weakness, decreased fine and gross motor coordination, decreased sensation, decreased proprioception, occasional ballistic movements with  RUE Restrictions Weight Bearing Restrictions: No Pain: Pain Assessment Pain Assessment: No/denies pain Pain Score: 0-No pain Locomotion : Ambulation Ambulation/Gait Assistance: 4: Min assist   See FIM for current functional status  Therapy/Group: Individual Therapy  Calvert Cantor 02/10/2013, 12:39 PM

## 2013-02-11 LAB — GLUCOSE, CAPILLARY
Glucose-Capillary: 128 mg/dL — ABNORMAL HIGH (ref 70–99)
Glucose-Capillary: 123 mg/dL — ABNORMAL HIGH (ref 70–99)
Glucose-Capillary: 144 mg/dL — ABNORMAL HIGH (ref 70–99)
Glucose-Capillary: 118 mg/dL — ABNORMAL HIGH (ref 70–99)

## 2013-02-11 NOTE — Progress Notes (Signed)
Patient ID: [name], male   DOB: [birth year], 79 y.o.   MRN: [id number] [name] is a 79 y.o. male Jan 09, 1934 [id number]  Subjective: No new complaints. No new problems. Slept well. Feeling OK. Optimistic for DC home early this week before the holiday  Objective: Vital signs in last 24 hours: Temp:  [97.5 F (36.4 C)-97.7 F (36.5 C)] 97.7 F (36.5 C) (11/23 0559) Pulse Rate:  [72-78] 72 (11/23 0630) Resp:  [18-19] 19 (11/23 0559) BP: (112-153)/(57-79) 112/72 mmHg (11/23 0630) SpO2:  [94 %-95 %] 95 % (11/23 0559) Weight:  [94.3 kg (207 lb 14.3 oz)] 94.3 kg (207 lb 14.3 oz) (11/23 0559) Weight change: -1.1 kg (-2 lb 6.8 oz) Last BM Date: 02/10/13  Intake/Output from previous day: 11/22 0701 - 11/23 0700 In: 240 [P.O.:240] Out: -   Physical Exam General: No apparent distress   Wife at side Lungs: Normal effort. Lungs clear to auscultation, no crackles or wheezes. Cardiovascular: Regular rate and rhythm, no edema  Lab Results: BMET    Component Value Date/Time   NA 137 02/02/2013 0600   K 3.8 02/02/2013 0600   CL 99 02/02/2013 0600   CO2 29 02/02/2013 0600   GLUCOSE 128* 02/02/2013 0600   BUN 11 02/02/2013 0600   CREATININE 0.88 02/02/2013 0600   CREATININE 0.97 07/19/2012 0830   CALCIUM 8.7 02/02/2013 0600   GFRNONAA 81* 02/02/2013 0600   GFRAA >90 02/02/2013 0600   CBC    Component Value Date/Time   WBC 8.8 02/02/2013 0600   RBC 4.18* 02/02/2013 0600   HGB 12.6* 02/02/2013 0600   HCT 37.9* 02/02/2013 0600   PLT 239 02/02/2013 0600   MCV 90.7 02/02/2013 0600   MCH 30.1 02/02/2013 0600   MCHC 33.2 02/02/2013 0600   RDW 14.1 02/02/2013 0600   LYMPHSABS 2.1 02/02/2013 0600   MONOABS 1.0 02/02/2013 0600   EOSABS 0.5 02/02/2013 0600   BASOSABS 0.0 02/02/2013 0600   CBG's (last 3):    Recent Labs  02/10/13 1701 02/10/13 2034 02/11/13 0733  GLUCAP 136* 97 128*   LFT's Lab Results  Component Value Date   ALT 25 02/02/2013   AST 28  02/02/2013   ALKPHOS 75 02/02/2013   BILITOT 0.3 02/02/2013    Studies/Results: No results found.  Medications:  I have reviewed the patient's current medications. Scheduled Medications: . atorvastatin  80 mg Oral Daily  . carbamazepine  300 mg Oral BID  . cloNIDine  0.2 mg Oral Daily  . clopidogrel  75 mg Oral Daily  . diazepam  2 mg Oral BID  . heparin  5,000 Units Subcutaneous Q8H  . hydrochlorothiazide  25 mg Oral Daily  . insulin aspart  0-9 Units Subcutaneous TID WC  . isosorbide mononitrate  30 mg Oral Daily  . levothyroxine  50 mcg Oral QAC breakfast  . losartan  50 mg Oral BID  . metFORMIN  500 mg Oral BID WC  . metoprolol  50 mg Oral BID  . pantoprazole  40 mg Oral Daily  . polyethylene glycol  17 g Oral Daily  . senna  2 tablet Oral Daily  . tamsulosin  0.4 mg Oral QPC breakfast   PRN Medications: acetaminophen, bisacodyl, fluticasone, meclizine, nitroGLYCERIN, ondansetron (ZOFRAN) IV, ondansetron, sorbitol  Assessment/Plan: Principal Problem:   Embolic cerebral infarction 1. Embolic infarcts left PCA distribution, most prominently involving left thalamus after cardiac catheterization  2. DVT Prophylaxis/Anticoagulation: Subcutaneous heparin.  3. Pain Management: Tylenol as needed. No  issues at present  4. Neuropsych: This patient is capable of making decisions on his own behalf.  -neuropsych assessment :Mood cycles a bit. Doesn't want medication for same at present---continue to watch  5. CAD status post catheterization 01/30/2013. Followup cardiac services. Continue medical management with Plavix therapy  6. Seizure disorder. Tegretol 300 mg twice a day, Valium 2 mg twice a day.  7. Hypertension. Lopressor 50 mg twice a day, Cozaar 50 mg twice a day, Imdur 30 mg daily, hydrochlorothiazide 25 mg daily, clonidine 0.2 mg daily. Monitor with increased mobility  8. Diabetes mellitus with peripheral neuropathy. Hemoglobin A1c 6.5. Sliding scale insulin prn. Check  CBGs a.c. and at bedtime. Patient on Glucophage 500 mg twice a day -reasonable control at present  9. Hypothyroidism. Synthroid --  10. BPH. Flomax. Voiding thus far.  11. Hyperlipidemia. Lipitor  12. Dizziness---hx of BPPV, obviously may be central related to stroke, episodes appear fairly limited  -continue prn antivert and supportive care   Length of stay, days: 10   Valerie A. Felicity Coyer, MD 02/11/2013, 9:32 AM

## 2013-02-12 ENCOUNTER — Inpatient Hospital Stay (HOSPITAL_COMMUNITY): Payer: Medicare Other

## 2013-02-12 ENCOUNTER — Inpatient Hospital Stay (HOSPITAL_COMMUNITY): Payer: Medicare Other | Admitting: Speech Pathology

## 2013-02-12 DIAGNOSIS — E1142 Type 2 diabetes mellitus with diabetic polyneuropathy: Secondary | ICD-10-CM

## 2013-02-12 DIAGNOSIS — I6340 Cerebral infarction due to embolism of unspecified cerebral artery: Secondary | ICD-10-CM

## 2013-02-12 DIAGNOSIS — R569 Unspecified convulsions: Secondary | ICD-10-CM

## 2013-02-12 DIAGNOSIS — IMO0001 Reserved for inherently not codable concepts without codable children: Secondary | ICD-10-CM

## 2013-02-12 DIAGNOSIS — E1165 Type 2 diabetes mellitus with hyperglycemia: Secondary | ICD-10-CM

## 2013-02-12 LAB — GLUCOSE, CAPILLARY
Glucose-Capillary: 132 mg/dL — ABNORMAL HIGH (ref 70–99)
Glucose-Capillary: 102 mg/dL — ABNORMAL HIGH (ref 70–99)
Glucose-Capillary: 129 mg/dL — ABNORMAL HIGH (ref 70–99)
Glucose-Capillary: 121 mg/dL — ABNORMAL HIGH (ref 70–99)

## 2013-02-12 NOTE — Progress Notes (Signed)
Physical Therapy Discharge Summary  Patient Details  Name: [name] MRN: [id number] Date of Birth: April 30, 1933  Today's Date: 02/12/2013 Time: Treatment Session 1: 4098-1191; Treatment Session 2: 1330-1410 Time Calculation (min): Treatment Session 1: ; Treatment Session 2:  Patient has met 11 of 11 long term goals due to improved activity tolerance, improved balance, increased strength, ability to compensate for deficits, functional use of  right upper extremity and right lower extremity, improved attention, improved awareness and improved coordination.  Patient to discharge at an ambulatory level Supervision.   Patient's care partner is independent to provide the necessary physical and cognitive assistance at discharge.  Reasons goals not met: N/A  Recommendation:  Patient will benefit from ongoing skilled PT services in outpatient setting to continue to advance safe functional mobility, address ongoing impairments in decreased balance, decreased proprioception, decreased coordination, decreased awareness, decreased attention, R inattention, and minimize fall risk.  Equipment: RW to decrease risk for falls and maximize level of independence.   Reasons for discharge: treatment goals met and discharge from hospital  Patient/family agrees with progress made and goals achieved: Yes  Skilled Therapeutic Interventions Treatment Session 1:  1:1. Pt received sitting in w/c, ready for therapy. Focus this session on family training with pt's wife and granddaughter. Pt able to demonstrate bed mobility on a standard bed, dynamic sitting balance, w/c propulsion and functional transfers at mod(I) level w/ RW. Pt's wife and granddaughter provided safe supervision as well as cues for safety during dynamic standing balance, car t/f, ambulation on hard level and carpeted surfaces and stair negotiation (wife only). Pt's wife req increased guidance compared to granddaughter, likely as  granddaughter is a Engineer, civil (consulting). Pt and family verbally educated on goals and benefits of OP PT, prevention of falls in home environment- picking up rugs, keeping urinal/RW close to bed at night as well as MD clearance for return to driving. Family verbalized understanding to all education this session. Pt's wife cleared to assist pt w/ mobility in room, RN made aware. Pt sitting in recliner at end of session w/ all needs in reach and wife in room.   Treatment Session 2:  1:1 Pt received sitting in recliner, ready for therapy. Pt able to amb room<>therapy gym w/ RW and distant (S). Occasional min verbal cues for management of RW. Focus this session on re-assessment of dynamic standing balance and therex. Berg Balance Test performed, pt scored 38/56 and educated on results compared to results at time of eval, 33/56. Pt therefore demonstrating mild improvement in balance, however, continues to be at risk for falls. Pt educated on benefit of RW to decrease risk of falls as well as maximize level of independence. Pt verbalized understanding. Pt performed NuStep, level 5x81min, for B UE/LE strength/endurance, good tolerance overall. At end of tx session pt using bathroom, nurse and wife in room.  PT Discharge Precautions/Restrictions Restrictions Weight Bearing Restrictions: No Vital Signs   Pain Pain Assessment Pain Assessment: No/denies pain Vision/Perception  Vision - History Baseline Vision: Bifocals Patient Visual Report: Blurring of vision Vision - Assessment Eye Alignment: Within Functional Limits Vision Assessment: Vision tested Tracking/Visual Pursuits: Able to track stimulus in all quads without difficulty Visual Fields: Right visual field deficit;Right homonymous hemianopsia Perception Perception: Impaired Inattention/Neglect: Does not attend to right side of body;Does not attend to right visual field Praxis Praxis: Intact  Cognition Overall Cognitive Status: Impaired/Different from  baseline Arousal/Alertness: Awake/alert Orientation Level: Oriented X4 Attention: Selective Sustained Attention: Appears intact Selective Attention: Impaired Selective  Attention Impairment: Functional basic Memory: Impaired Memory Impairment: Decreased recall of new information;Decreased short term memory;Storage deficit Decreased Short Term Memory: Functional basic;Verbal basic Awareness: Impaired Awareness Impairment: Emergent impairment Problem Solving: Impaired Problem Solving Impairment: Functional complex Safety/Judgment: Appears intact Sensation Sensation Light Touch: Impaired Detail Light Touch Impaired Details: Impaired RUE;Impaired RLE Stereognosis: Impaired Detail Stereognosis Impaired Details: Absent RUE Hot/Cold: Impaired by gross assessment Proprioception: Impaired by gross assessment Proprioception Impaired Details: Impaired RUE;Impaired RLE Additional Comments: Pt reports mild N/T in R UE; pt also verbalizes improved proprioception in R UE/LE, but still req intermittent cueing for attending to positioning. Pt continues to demonstrate impaired light touch, distal>proximal Motor  Motor Motor: Other (comment) Motor - Discharge Observations: decreased motor control of R UE  Mobility Bed Mobility Bed Mobility: Sit to Supine;Supine to Sit Supine to Sit: 6: Modified independent (Device/Increase time) Sit to Supine: 6: Modified independent (Device/Increase time) Transfers Transfers: Yes Sit to Stand: 6: Modified independent (Device/Increase time) Stand to Sit: 6: Modified independent (Device/Increase time) Stand Pivot Transfers: 6: Modified independent (Device/Increase time) Stand Pivot Transfer Details (indicate cue type and reason): Supervision for car transfers Locomotion  Ambulation Ambulation: Yes Ambulation/Gait Assistance: 5: Supervision Ambulation Distance (Feet): 200 Feet Assistive device: Rolling walker Ambulation/Gait Assistance Details: Verbal cues  for precautions/safety Ambulation/Gait Assistance Details: Pt req occasional min verbal cues for management of RW, specifically staying inside and keeping it close to body during turns Gait Gait: Yes Gait Pattern: Within Functional Limits Gait Pattern: Step-to pattern;Narrow base of support Gait velocity: decreased Stairs / Additional Locomotion Stairs: Yes Stairs Assistance: 5: Supervision Stairs Assistance Details: Verbal cues for precautions/safety;Verbal cues for sequencing Stair Management Technique: Two rails;One rail Left;Sideways;Step to pattern Number of Stairs: 16 Wheelchair Mobility Wheelchair Mobility: Yes Wheelchair Assistance: 6: Modified independent (Device/Increase time) Occupational hygienist: Both lower extermities Wheelchair Parts Management: Independent;Other (comment) (with brakes) Distance: 200'  Trunk/Postural Assessment  Cervical Assessment Cervical Assessment: Within Functional Limits Thoracic Assessment Thoracic Assessment: Exceptions to [facility] (kyphotic) Lumbar Assessment Lumbar Assessment: Within Functional Limits Postural Control Postural Control: Within Functional Limits  Balance Balance Balance Assessed: Yes Standardized Balance Assessment Standardized Balance Assessment: Berg Balance Test Berg Balance Test Sit to Stand: Able to stand without using hands and stabilize independently Standing Unsupported: Able to stand 2 minutes with supervision Sitting with Back Unsupported but Feet Supported on Floor or Stool: Able to sit safely and securely 2 minutes Stand to Sit: Sits safely with minimal use of hands Transfers: Able to transfer safely, definite need of hands Standing Unsupported with Eyes Closed: Able to stand 10 seconds with supervision Standing Ubsupported with Feet Together: Able to place feet together independently and stand for 1 minute with supervision From Standing, Reach Forward with Outstretched Arm: Can reach forward >12 cm safely  (5") From Standing Position, Pick up Object from Floor: Able to pick up shoe, needs supervision From Standing Position, Turn to Look Behind Over each Shoulder: Turn sideways only but maintains balance Turn 360 Degrees: Able to turn 360 degrees safely but slowly Standing Unsupported, Alternately Place Feet on Step/Stool: Needs assistance to keep from falling or unable to try Standing Unsupported, One Foot in Front: Able to plae foot ahead of the other independently and hold 30 seconds Standing on One Leg: Tries to lift leg/unable to hold 3 seconds but remains standing independently Total Score: 38 Static Sitting Balance Static Sitting - Balance Support: No upper extremity supported;Feet unsupported Static Sitting - Level of Assistance: 7: Independent Dynamic Sitting Balance Dynamic Sitting -  Balance Support: Left upper extremity supported;Right upper extremity supported;Feet supported Dynamic Sitting - Level of Assistance: 6: Modified independent (Device/Increase time) Static Standing Balance Static Standing - Balance Support: Bilateral upper extremity supported Static Standing - Level of Assistance: 6: Modified independent (Device/Increase time) Dynamic Standing Balance Dynamic Standing - Balance Support: Bilateral upper extremity supported;Left upper extremity supported;Right upper extremity supported Dynamic Standing - Level of Assistance: 5: Stand by assistance Dynamic Standing - Balance Activities: Forward lean/weight shifting;Lateral lean/weight shifting;Reaching for objects Extremity Assessment  RUE Assessment RUE Assessment: Exceptions to [facility] RUE AROM (degrees) RUE Overall AROM Comments: WFL, h/o rotator cuff injury RUE Tone RUE Tone Comments: occasional ballistic movements LUE Assessment LUE Assessment: Within Functional Limits RLE Assessment RLE Assessment: Within Functional Limits RLE Strength RLE Overall Strength Comments: Grossly 4+/5 LLE Assessment LLE Assessment:  Within Functional Limits LLE Strength LLE Overall Strength Comments: Grossly 4+/5  See FIM for current functional status  Denzil Hughes 02/12/2013, 2:05 PM

## 2013-02-12 NOTE — Progress Notes (Signed)
Occupational Therapy Discharge Summary  Patient Details  Name: [name] MRN: [id number] Date of Birth: 1933-04-10  Today's Date: 02/12/2013  Patient has met 11 of 11 long term goals due to improved activity tolerance, improved balance, ability to compensate for deficits, improved functional use of  RIGHT upper extremity and improved coordination.  Pt demonstrated significant functional gains with BADLs during this admission.  Pt completes bathing and dressing tasks with supervision and is mod I for toilet transfers and toileting.  Pt continues to require occasional verbal cues to attend to right during functional tasks.  Pt's wife has been present during therapy sessions and has demonstrated an adequate understanding of recommendations and necessity for 24 hour supervision after discharge. Pt continues to exhibit decreased sensation in RUE but is using his RUE at diminished level during functional tasks. Patient to discharge at overall Supervision level.  Patient's care partner is independent to provide the necessary physical and cognitive assistance at discharge to supervise performance of ADL and promote continued rehabilitation of right hand function.     Recommendation:  Patient will benefit from ongoing skilled OT services in home health setting to continue to advance functional skills in the area of BADL.  Equipment: BSC, tub transfer bench  Reasons for discharge: treatment goals met  Patient/family agrees with progress made and goals achieved: Yes  OT Discharge Precautions/Restrictions  Restrictions Weight Bearing Restrictions: No  ADL ADL Eating: Modified independent Upper Body Bathing: Supervision/safety Lower Body Bathing: Supervision/safety Upper Body Dressing: Modified independent (Device) Lower Body Dressing: Supervision/safety Toileting: Modified independent Toilet Transfer: Modified independent Film/video editor: Close supervision Architectural technologist: Shower seat with back  Vision/Perception  Vision - History Baseline Vision: Bifocals Patient Visual Report: Blurring of vision Vision - Assessment Eye Alignment: Within Functional Limits Vision Assessment: Vision tested Tracking/Visual Pursuits: Able to track stimulus in all quads without difficulty Visual Fields: Right visual field deficit;Right homonymous hemianopsia Perception Perception: Impaired Inattention/Neglect: Does not attend to right side of body;Does not attend to right visual field Praxis Praxis: Intact   Cognition Overall Cognitive Status: Impaired/Different from baseline Arousal/Alertness: Awake/alert Orientation Level: Oriented X4 Attention: Sustained;Selective Sustained Attention: Appears intact Selective Attention: Impaired Selective Attention Impairment: Verbal basic;Functional basic Memory: Impaired Memory Impairment: Decreased recall of new information;Decreased short term memory Decreased Short Term Memory: Verbal basic;Functional basic Awareness: Impaired Awareness Impairment: Emergent impairment Problem Solving: Appears intact  Sensation Sensation Light Touch: Impaired Detail Light Touch Impaired Details: Impaired RUE;Impaired RLE Stereognosis: Impaired Detail Stereognosis Impaired Details: Absent RUE Hot/Cold: Impaired by gross assessment Proprioception: Impaired by gross assessment Proprioception Impaired Details: Impaired RUE;Impaired RLE Additional Comments: Pt reports mild N/T in R UE; pt also verbalizes improved proprioception in R UE/LE, but still req intermittent cueing for attending to positioning. Pt continues to demonstrate impaired light touch, distal>proximal Coordination Gross Motor Movements are Fluid and Coordinated: Yes Heel Shin Test: Improved speed and accuracy of R LE, however, limited by hip ROM  Motor  Motor Motor: Other (comment) Motor - Discharge Observations: decreased motor control of R UE     Trunk/Postural Assessment  Cervical Assessment Cervical Assessment: Within Functional Limits Thoracic Assessment Thoracic Assessment: Exceptions to [facility] (kyphotic) Lumbar Assessment Lumbar Assessment: Within Functional Limits Postural Control Postural Control: Within Functional Limits   Balance Balance Balance Assessed: Yes Static Sitting Balance Static Sitting - Level of Assistance: 7: Independent Dynamic Sitting Balance Dynamic Sitting - Level of Assistance: 6: Modified independent (Device/Increase time)  Extremity/Trunk Assessment RUE Assessment RUE Assessment: Exceptions  to [facility] RUE AROM (degrees) RUE Overall AROM Comments: WFL, h/o rotator cuff injury RUE Tone RUE Tone Comments: occasional ballistic movements LUE Assessment LUE Assessment: Within Functional Limits  See FIM for current functional status  Rich Brave 02/12/2013, 12:20 PM

## 2013-02-12 NOTE — Progress Notes (Signed)
Speech Language Pathology Daily Session Note  Patient Details  Name: [name] MRN: [id number] Date of Birth: 1933/06/06  Today's Date: 02/12/2013 Time: 8756-4332 Time Calculation (min): 25 min  Short Term Goals: Week 2: SLP Short Term Goal 1 (Week 2): Pt will utilize external memory aids to recall new, daily information with supervision verbal and visual cues.  SLP Short Term Goal 2 (Week 2): Pt will demonstrate alternating attention between two task for 30 minutes with supervision verbal cues.  SLP Short Term Goal 3 (Week 2): Pt will demonstrate functional problem solving with basic and familair tasks with Mod I. SLP Short Term Goal 4 (Week 2): Pt will attend to right field of enviornment during functional tasks with supervision verbal and question cues.   Skilled Therapeutic Interventions: Treatment focus on pt/family education in regards to pt's current cognitive function and strategies to utilize at home to increase working memory, safety awareness and functional problem solving. Pt and wife verbalized understanding and handout was given to reinforce information provided.    FIM:  Comprehension Comprehension Mode: Auditory Comprehension: 5-Follows basic conversation/direction: With no assist Expression Expression: 5-Expresses complex 90% of the time/cues < 10% of the time Social Interaction Social Interaction: 6-Interacts appropriately with others with medication or extra time (anti-anxiety, antidepressant). Problem Solving Problem Solving: 5-Solves complex 90% of the time/cues < 10% of the time Memory Memory: 4-Recognizes or recalls 75 - 89% of the time/requires cueing 10 - 24% of the time  Pain Pain Assessment Pain Assessment: No/denies pain  Therapy/Group: Individual Therapy  PAYNE, COURTNEY 02/12/2013, 11:30 AM

## 2013-02-12 NOTE — Discharge Summary (Signed)
Discharge summary job [id number]

## 2013-02-12 NOTE — Discharge Summary (Signed)
NAME:  [name], [name] NO.:  [id number]  MEDICAL RECORD NO.:  [id number]  LOCATION:  [id number]                        FACILITY:  [facility]  PHYSICIAN:  Ranelle Oyster, M.D.DATE OF BIRTH:  Sep 02, 1933  DATE OF ADMISSION:  02/01/2013 DATE OF DISCHARGE:  02/13/2013                              DISCHARGE SUMMARY   DISCHARGE DIAGNOSES: 1. 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 2. Subcutaneous heparin for deep vein thrombosis prophylaxis. 3. Coronary artery disease status post catheterization January 30, 2013. 4. Seizure disorder. 5. Hypertension. 6. Diabetes mellitus. 7. Peripheral neuropathy. 8. Hypothyroidism. 9. Benign prostatic hypertrophy. 10.Hyperlipidemia.  HISTORY OF PRESENT ILLNESS:  This is a 79 year old right-handed male history of coronary artery disease, myocardial infarction with stenting, hypertension, seizure disorder, who was admitted January 29, 2013, with left-sided chest pain while raking leaves.  He did receive nitroglycerin in the emergency department with some relief.  EKG no significant ST- segment changes.  Troponin negative.  Underwent cardiac catheterization on January 30, 2013, that showed 40-50% proximal LAD stenosis and advised medical management per Cardiology Services.  Noted post catheterization with right facial numbness and slurred speech.  MRI showed bilateral anterior-posterior infarctions.  MRA confirms findings of left PCA occlusion.  Echocardiogram January 31, 2013, with ejection fraction 55% grade 1 diastolic dysfunction.  Carotid Dopplers with left 60-79% ICA stenosis.  The patient did not receive tPA.  Maintained on Plavix therapy for CVA prophylaxis.  Subcutaneous heparin added for DVT prophylaxis.  Physical and occupational therapy ongoing.  The patient was admitted for comprehensive rehab program.  PAST MEDICAL HISTORY:  See discharge  diagnoses.  SOCIAL HISTORY:  Lives with spouse.  FUNCTIONAL HISTORY:  Prior to admission independent.  FUNCTIONAL STATUS:  Upon admission to Rehab Services was minimum to moderate assist functional mobility.  PHYSICAL EXAMINATION:  VITAL SIGNS:  Blood pressure 113/62, pulse 64, temperature 97.1, respirations 18. GENERAL:  This was an alert male, oriented x3.  Well developed, well nourished. HEENT:  Pupils were round and reactive to light. LUNGS:  Clear to auscultation. CARDIAC:  Regular rate and rhythm. ABDOMEN:  Soft, nontender.  Good bowel sounds. NEUROLOGIC:  He had mild right facial weakness.  Decreased sensation around the mouth and tongue.  His speech was slurred but intelligible.  REHABILITATION HOSPITAL COURSE:  The patient was admitted to Inpatient Rehab Services with therapies initiated on a 3-hour daily basis consisting of physical therapy, occupational therapy, speech therapy, and rehabilitation nursing.  The following issues were addressed during the patient's rehabilitation stay.  Pertaining to [name]' embolic infarction after cardiac catheterization, remained stable maintained on Plavix therapy.  Subcutaneous heparin for DVT prophylaxis with no bleeding episodes.  He had no further chest pain or shortness of breath. He would follow up with Cardiology Services.  He remained on Tegretol and Valium for history of seizure disorder.  Blood pressures remained well controlled with multiple antihypertensive medications with no orthostatic changes and again would follow Cardiology Services.  He did have a history of diabetes mellitus with peripheral neuropathy, hemoglobin A1c 6.5.  He remained on Glucophage 500 mg twice daily with full diabetic teaching.  Hormone replacement ongoing for hypothyroidism. He did have a history of benign prostatic hypertrophy voiding without difficulty maintained on Flomax.  The patient received weekly collaborative interdisciplinary team  conferences to discuss estimated length of stay, family teaching, and any barriers to his discharge.  He was ambulating with minimum guard for stability and balance.  He was using a straight point cane.  He needed some demonstration for correcting sequencing with a straight point cane.  He had some impaired motor control, coordination and sensation of right upper extremity with some decreased safety awareness  for his activities of daily living. Full family teaching was completed.  Recommendations ongoing for 24-hour assistance for safety.  The patient was discharged to home.  DISCHARGE MEDICATIONS: 1. Lipitor 80 mg p.o. daily. 2. Tegretol XR 300 mg p.o. b.i.d. 3. Clonidine 0.2 mg p.o. daily. 4. Plavix 75 mg p.o. daily. 5. Valium 2 mg p.o. b.i.d. 6. Flonase 1 spray each nostril as needed. 7. Imdur 30 mg p.o. daily. 8. Synthroid 50 mcg p.o. daily. 9. Cozaar 50 mg p.o. b.i.d. 10.Antivert 25 mg p.o. t.i.d. as needed dizziness. 11.Glucophage 500 mg p.o. b.i.d. 12.Lopressor 50 mg p.o. b.i.d. 13.Nitroglycerin 0.4 mg sublingual as needed chest pain. 14.Protonix 40 mg p.o. daily. 15.MiraLax 17 g daily hold for loose stool. 16.Flomax 0.4 mg p.o. daily.  DIET:  Diabetic diet.  SPECIAL INSTRUCTIONS:  The patient would follow up Dr. Faith Rogue at the outpatient rehab service office as directed, Dr. Delia Heady Neurology Service 1 month call for appointment, Dr. [name] 2 weeks call for appointment, and Dr. Lynnea Ferrier, Medical Management. Ongoing therapies were arranged as per [employer].     Mariam Dollar, P.A.   ______________________________ Ranelle Oyster, M.D.    DA/MEDQ  D:  02/12/2013  T:  02/12/2013  Job:  [id number]  cc:   [name], MD Pramod P. Pearlean Brownie, MD Claude Manges, MD

## 2013-02-12 NOTE — Progress Notes (Signed)
Speech Language Pathology Discharge Summary  Patient Details  Name: [name] MRN: [id number] Date of Birth: [birth year]  Today's Date: 02/12/2013  Patient has met 6 of 6 long term goals.  Patient to discharge at overall Supervision level.   Reasons goals not met: N/A   Clinical Impression/Discharge Summary: Pt has made functional gains and has met 6 of 6 LTG's this admission due to improvements in verbal expression and cognitive function.  Pt requires overall supervision cueing for functional problem solving, attention to right field of environment, safety awareness, emergent awareness of deficits and working memory with utilization of external memory aids. Pt is also Mod I for word-finding and verbal expression of wants/needs. Pt/family education complete and pt will discharge home with 24 hour supervision from family. Pt would benefit from f/u outpatient SLP services to maximize cognitive function and overall functional independence.    Care Partner:  Caregiver Able to Provide Assistance: Yes  Type of Caregiver Assistance: Physical;Cognitive  Recommendation:  Outpatient SLP;24 hour supervision/assistance  Rationale for SLP Follow Up: Maximize cognitive function and independence;Reduce caregiver burden   Equipment: N/A   Reasons for discharge: Treatment goals met;Discharged from hospital   Patient/Family Agrees with Progress Made and Goals Achieved: Yes   See FIM for current functional status  PAYNE, COURTNEY 02/12/2013, 3:13 PM

## 2013-02-12 NOTE — Progress Notes (Signed)
Subjective/Complaints: No c/os A 12 point review of systems has been performed and if not noted above is otherwise negative.   Objective: Vital Signs: Blood pressure 120/60, pulse 78, temperature 98.3 F (36.8 C), temperature source Oral, resp. rate 18, height 5\' 9"  (1.753 m), weight 95 kg (209 lb 7 oz), SpO2 93.00%. No results found. No results found for this basename: WBC, HGB, HCT, PLT,  in the last 72 hours No results found for this basename: NA, K, CL, CO, GLUCOSE, BUN, CREATININE, CALCIUM,  in the last 72 hours CBG (last 3)   Recent Labs  02/12/13 0731 02/12/13 1126 02/12/13 1641  GLUCAP 132* 102* 129*    Wt Readings from Last 3 Encounters:  02/12/13 95 kg (209 lb 7 oz)  01/30/13 100.789 kg (222 lb 3.2 oz)  01/30/13 100.789 kg (222 lb 3.2 oz)    Physical Exam:  Constitutional: He is oriented to person, place, and time. He appears well-developed and well-nourished.  HENT:  Head: Normocephalic and atraumatic.  Right Ear: External ear normal.  Left Ear: External ear normal.  Eyes: EOM are normal. Right eye exhibits no discharge. Left eye exhibits no discharge.  Neck: Normal range of motion. Neck supple. No JVD present. No tracheal deviation present. No thyromegaly present.  Cardiovascular: Normal rate and regular rhythm.  Respiratory: Effort normal and breath sounds normal. No respiratory distress.  GI: Soft. Bowel sounds are normal. He exhibits no distension. There is no tenderness. There is no rebound.  Musculoskeletal: He exhibits no edema and no tenderness.  Lymphadenopathy:  He has no cervical adenopathy.  Neurological: He is alert and oriented to person, place, and time. i saw no nystagmus Mild right facial weakness. Decreased sensation around the mouth and tongue  .  RUE 4/5. RLE 4 to 4+/5. Right upper and lower limb ataxia remains.  Speech slurred but improved. Reasonable insight and awareness.   STM functional.  Skin: Skin is warm and dry.  Psychiatric: He  has a normal mood and affect. His behavior is normal. Judgment and thought content normal. Affect a little flat still but he remains pleasant  Assessment/Plan: 1. Functional deficits secondary to embolic, left PCA/thalamic infarct which require 3+ hours per day of interdisciplinary therapy in a comprehensive inpatient rehab setting. Physiatrist is providing close team supervision and 24 hour management of active medical problems listed below. Physiatrist and rehab team continue to assess barriers to discharge/monitor patient progress toward functional and medical goals. FIM: FIM - Bathing Bathing Steps Patient Completed: Chest;Right Arm;Left Arm;Abdomen;Front perineal area;Buttocks;Right upper leg;Left upper leg;Right lower leg (including foot);Left lower leg (including foot) Bathing: 5: Supervision: Safety issues/verbal cues  FIM - Upper Body Dressing/Undressing Upper body dressing/undressing steps patient completed: Thread/unthread left sleeve of pullover shirt/dress;Thread/unthread right sleeve of pullover shirt/dresss;Put head through opening of pull over shirt/dress;Pull shirt over trunk Upper body dressing/undressing: 6: More than reasonable amount of time FIM - Lower Body Dressing/Undressing Lower body dressing/undressing steps patient completed: Thread/unthread right underwear leg;Thread/unthread left underwear leg;Pull underwear up/down;Thread/unthread right pants leg;Thread/unthread left pants leg;Pull pants up/down;Fasten/unfasten pants;Don/Doff right shoe;Don/Doff left shoe;Fasten/unfasten right shoe;Fasten/unfasten left shoe;Don/Doff right sock;Don/Doff left sock Lower body dressing/undressing: 5: Supervision: Safety issues/verbal cues  FIM - Toileting Toileting steps completed by patient: Adjust clothing prior to toileting;Performs perineal hygiene;Adjust clothing after toileting Toileting Assistive Devices: Grab bar or rail for support Toileting: 6: More than reasonable amount of  time  FIM - Diplomatic Services operational officer Devices: Elevated toilet seat;Grab bars;Walker Toilet Transfers: 6-To toilet/ BSC;6-From  toilet/BSC  FIM - Banker Devices: Walker;Arm rests Bed/Chair Transfer: 6: Sit > Supine: No assist;6: Supine > Sit: No assist;6: Chair or W/C > Bed: No assist;6: Bed > Chair or W/C: No assist  FIM - Locomotion: Wheelchair Distance: 200' Locomotion: Wheelchair: 6: Travels 150 ft or more, turns around, maneuvers to table, bed or toilet, negotiates 3% grade: maneuvers on rugs and over door sills independently FIM - Locomotion: Ambulation Locomotion: Ambulation Assistive Devices: Designer, industrial/product Ambulation/Gait Assistance: 5: Supervision Locomotion: Ambulation: 5: Travels 150 ft or more with supervision/safety issues  Comprehension Comprehension Mode: Auditory Comprehension: 5-Follows basic conversation/direction: With no assist  Expression Expression Mode: Verbal Expression: 5-Expresses complex 90% of the time/cues < 10% of the time  Social Interaction Social Interaction: 6-Interacts appropriately with others with medication or extra time (anti-anxiety, antidepressant).  Problem Solving Problem Solving: 5-Solves complex 90% of the time/cues < 10% of the time  Memory Memory: 4-Recognizes or recalls 75 - 89% of the time/requires cueing 10 - 24% of the time  Medical Problem List and Plan:  1. Embolic infarcts left PCA distribution, most prominently involving left thalamus after cardiac catheterization  2. DVT Prophylaxis/Anticoagulation: Subcutaneous heparin.   3. Pain Management: Tylenol as needed. No issues at present 4. Neuropsych: This patient is capable of making decisions on his own behalf.   -neuropsych assessment  - No antidepressant at present. Mood cycles a bit. Doesn't want medication for it at present---continue to watch 5. CAD status post catheterization 01/30/2013. Followup cardiac  services. Continue medical management with Plavix therapy  6. Seizure disorder. Tegretol 300 mg twice a day, Valium 2 mg twice a day.   7. Hypertension. Lopressor 50 mg twice a day, Cozaar 50 mg twice a day, Imdur 30 mg daily, hydrochlorothiazide 25 mg daily, clonidine 0.2 mg daily. Monitor with increased mobility  8. Diabetes mellitus with peripheral neuropathy. Hemoglobin A1c 6.5. Sliding scale insulin her present. Check CBGs a.c. and at bedtime. Patient on Glucophage 500 mg twice a day prior to admission--resume today .   -reasonable control at present 9. Hypothyroidism. Synthroid --check TSH 10. BPH. Flomax.  Voiding thus far.   11. Hyperlipidemia. Lipitor 12. Dizziness---hx of BPPV, obviously may be central related to stroke, episodes appear fairly limited   LOS (Days) 11 A FACE TO FACE EVALUATION WAS PERFORMED  Erick Colace 02/12/2013 8:14 PM

## 2013-02-12 NOTE — Progress Notes (Signed)
Occupational Therapy Session Note  Patient Details  Name: [name] MRN: [id number] Date of Birth: December 27, 1933  Today's Date: 02/12/2013 Time: 0700-0755 Time Calculation (min): 55 min  Short Term Goals: Week 2:   STG=LTG  Skilled Therapeutic Interventions/Progress Updates:    Pt engaged in ADL retraining including bathing at shower level and dressing with sit<>stand from tub bench.  Focus on dynamic standing balance, functional amb with RW for home mgmt tasks, increased RUE during functional tasks, family education, and safety awareness.  Pt performed all tasks at supervision level with min verbal cues for safety awareness.  Pt performed toilet transfers and toileting at mod I level.  Wife present during session to assist and provide appropriate supervision.    Therapy Documentation Precautions:  Precautions Precautions: Fall Precaution Comments: right sided weakness, decreased fine and gross motor coordination, decreased sensation, decreased proprioception, occasional ballistic movements with RUE Restrictions Weight Bearing Restrictions: No  See FIM for current functional status  Therapy/Group: Individual Therapy  Rich Brave 02/12/2013, 8:00 AM

## 2013-02-13 LAB — GLUCOSE, CAPILLARY: Glucose-Capillary: 128 mg/dL — ABNORMAL HIGH (ref 70–99)

## 2013-02-13 MED ORDER — HYDROCHLOROTHIAZIDE 25 MG PO TABS: 25.0000 mg | ORAL_TABLET | Freq: Every day | ORAL | Status: DC

## 2013-02-13 MED ORDER — DIAZEPAM 2 MG PO TABS: 2.0000 mg | ORAL_TABLET | Freq: Two times a day (BID) | ORAL | Status: DC

## 2013-02-13 MED ORDER — ATORVASTATIN CALCIUM 40 MG PO TABS: 80.0000 mg | ORAL_TABLET | Freq: Every day | ORAL | Status: DC

## 2013-02-13 MED ORDER — LOSARTAN POTASSIUM 50 MG PO TABS: 50.0000 mg | ORAL_TABLET | Freq: Two times a day (BID) | ORAL | Status: DC

## 2013-02-13 MED ORDER — MECLIZINE HCL 25 MG PO TABS: 25.0000 mg | ORAL_TABLET | Freq: Three times a day (TID) | ORAL | Status: DC | PRN

## 2013-02-13 MED ORDER — LEVOTHYROXINE SODIUM 25 MCG PO TABS: 50.0000 ug | ORAL_TABLET | Freq: Every day | ORAL | Status: DC

## 2013-02-13 MED ORDER — CLOPIDOGREL BISULFATE 75 MG PO TABS: 75.0000 mg | ORAL_TABLET | Freq: Every day | ORAL | Status: DC

## 2013-02-13 MED ORDER — ISOSORBIDE MONONITRATE ER 30 MG PO TB24: 30.0000 mg | ORAL_TABLET | Freq: Every day | ORAL | Status: DC

## 2013-02-13 MED ORDER — NITROGLYCERIN 0.4 MG SL SUBL: 0.4000 mg | SUBLINGUAL_TABLET | SUBLINGUAL | Status: DC | PRN

## 2013-02-13 MED ORDER — PANTOPRAZOLE SODIUM 40 MG PO TBEC: 40.0000 mg | DELAYED_RELEASE_TABLET | Freq: Every day | ORAL | Status: DC

## 2013-02-13 MED ORDER — METFORMIN HCL 500 MG PO TABS: 500.0000 mg | ORAL_TABLET | Freq: Two times a day (BID) | ORAL | Status: DC

## 2013-02-13 MED ORDER — METOPROLOL TARTRATE 50 MG PO TABS: 50.0000 mg | ORAL_TABLET | Freq: Two times a day (BID) | ORAL | Status: DC

## 2013-02-13 MED ORDER — CARBAMAZEPINE ER 300 MG PO CP12: 300.0000 mg | ORAL_CAPSULE | Freq: Two times a day (BID) | ORAL | Status: DC

## 2013-02-13 MED ORDER — CLONIDINE HCL 0.2 MG PO TABS: 0.2000 mg | ORAL_TABLET | Freq: Every day | ORAL | Status: DC

## 2013-02-13 NOTE — Progress Notes (Signed)
Social Work Discharge Note  The overall goal for the admission was met for:   Discharge location:  Yes - home  Length of Stay: Yes - 12 days  Discharge activity level: Yes - supervision  Home/community participation: Yes  Services provided included: MD, RD, PT, OT, SLP, RN, TR, Pharmacy, Neuropsych and SW  Financial Services: Medicare and Private Insurance: [employer] [place]  Follow-up services arranged: Outpatient: [facility] for PT, OT, SLP, DME: rolling walker and tub seat with back and Patient/Family has no preference for HH/DME agencies - used [facility]  Comments (or additional information):  Pt has information on stroke support groups  Patient/Family verbalized understanding of follow-up arrangements: Yes  Individual responsible for coordination of the follow-up plan: Pt, his wife, and their children/grandchildren will help see plan through.  Confirmed correct DME delivered: Elvera Lennox 02/13/2013    [place], [place]

## 2013-02-13 NOTE — Progress Notes (Signed)
Patient and family received discharge instructions from Deatra Ina, PA with verbal understanding. Patient discharged to home. Belongings with patient.

## 2013-02-21 ENCOUNTER — Ambulatory Visit: Payer: Medicare Other

## 2013-02-21 ENCOUNTER — Ambulatory Visit: Payer: Medicare Other | Admitting: Physical Therapy

## 2013-02-21 ENCOUNTER — Ambulatory Visit: Payer: Medicare Other | Attending: Physical Medicine & Rehabilitation | Admitting: Occupational Therapy

## 2013-02-21 DIAGNOSIS — R269 Unspecified abnormalities of gait and mobility: Secondary | ICD-10-CM | POA: Insufficient documentation

## 2013-02-21 DIAGNOSIS — R41842 Visuospatial deficit: Secondary | ICD-10-CM | POA: Insufficient documentation

## 2013-02-21 DIAGNOSIS — R293 Abnormal posture: Secondary | ICD-10-CM | POA: Insufficient documentation

## 2013-02-21 DIAGNOSIS — R41841 Cognitive communication deficit: Secondary | ICD-10-CM | POA: Insufficient documentation

## 2013-02-21 DIAGNOSIS — M6281 Muscle weakness (generalized): Secondary | ICD-10-CM | POA: Diagnosis not present

## 2013-02-21 DIAGNOSIS — I69959 Hemiplegia and hemiparesis following unspecified cerebrovascular disease affecting unspecified side: Secondary | ICD-10-CM | POA: Insufficient documentation

## 2013-02-21 DIAGNOSIS — R4189 Other symptoms and signs involving cognitive functions and awareness: Secondary | ICD-10-CM | POA: Insufficient documentation

## 2013-02-21 DIAGNOSIS — IMO0001 Reserved for inherently not codable concepts without codable children: Secondary | ICD-10-CM | POA: Insufficient documentation

## 2013-02-21 DIAGNOSIS — R5381 Other malaise: Secondary | ICD-10-CM | POA: Diagnosis not present

## 2013-02-21 DIAGNOSIS — R279 Unspecified lack of coordination: Secondary | ICD-10-CM | POA: Insufficient documentation

## 2013-02-23 ENCOUNTER — Telehealth: Payer: Self-pay | Admitting: *Deleted

## 2013-02-23 ENCOUNTER — Ambulatory Visit: Payer: Medicare Other | Admitting: Occupational Therapy

## 2013-02-23 ENCOUNTER — Ambulatory Visit: Payer: Medicare Other

## 2013-02-23 ENCOUNTER — Ambulatory Visit: Payer: Medicare Other | Admitting: Physical Therapy

## 2013-02-23 NOTE — Telephone Encounter (Signed)
Patient has been scheduled , left message for callback to confirm

## 2013-02-27 ENCOUNTER — Ambulatory Visit: Payer: Medicare Other | Admitting: Rehabilitative and Restorative Service Providers"

## 2013-02-27 ENCOUNTER — Encounter: Payer: Medicare Other | Admitting: Occupational Therapy

## 2013-02-27 ENCOUNTER — Ambulatory Visit: Payer: Medicare Other | Admitting: Speech Pathology

## 2013-02-28 ENCOUNTER — Ambulatory Visit (INDEPENDENT_AMBULATORY_CARE_PROVIDER_SITE_OTHER): Payer: Medicare Other | Admitting: Physician Assistant

## 2013-02-28 ENCOUNTER — Ambulatory Visit: Payer: Medicare Other | Admitting: Physical Therapy

## 2013-02-28 ENCOUNTER — Encounter: Payer: Self-pay | Admitting: Physician Assistant

## 2013-02-28 ENCOUNTER — Encounter: Payer: Medicare Other | Admitting: Occupational Therapy

## 2013-02-28 VITALS — BP 130/76 | HR 74 | Ht 68.0 in | Wt 220.8 lb

## 2013-02-28 DIAGNOSIS — I6523 Occlusion and stenosis of bilateral carotid arteries: Secondary | ICD-10-CM

## 2013-02-28 DIAGNOSIS — I6529 Occlusion and stenosis of unspecified carotid artery: Secondary | ICD-10-CM | POA: Diagnosis not present

## 2013-02-28 DIAGNOSIS — I6340 Cerebral infarction due to embolism of unspecified cerebral artery: Secondary | ICD-10-CM | POA: Diagnosis not present

## 2013-02-28 DIAGNOSIS — E785 Hyperlipidemia, unspecified: Secondary | ICD-10-CM

## 2013-02-28 DIAGNOSIS — I2510 Atherosclerotic heart disease of native coronary artery without angina pectoris: Secondary | ICD-10-CM | POA: Diagnosis not present

## 2013-02-28 DIAGNOSIS — I10 Essential (primary) hypertension: Secondary | ICD-10-CM

## 2013-02-28 DIAGNOSIS — I658 Occlusion and stenosis of other precerebral arteries: Secondary | ICD-10-CM

## 2013-02-28 MED ORDER — CLONIDINE HCL 0.1 MG PO TABS: 0.1000 mg | ORAL_TABLET | Freq: Two times a day (BID) | ORAL | Status: DC

## 2013-02-28 NOTE — Progress Notes (Signed)
[address] [place], [place]  [address] Phone: [phone] Fax:  [phone]  Date:  02/28/2013   ID:  [name], DOB 05-Oct-1933, MRN [id number]  PCP:  [name], MD  Cardiologist:  Dr. Rollene Rotunda     History of Present Illness: [name] is a 79 y.o. male with a hx of CAD, status post MI treated with Cypher DES to the pRCA in 06/2002, HL, hypothyroidism, seizure disorder, diabetes, HTN. Nuclear study in 05/2011 was negative for ischemia. ETT in 04/2012 was also normal.   Patient was recently admitted 11/10-11/13 with chest pain.  I have reviewed all of his hospital records. Cardiac markers were normal.  LHC (01/30/2013):  pLAD 40-50%, oD1 70-80%, oOM1 40%, pRCA stent patent, mid RCA 30%, EF 60-65%. Disease in the D1 appeared to be a potential cause for angina but not likely the cause of the patient's presentation. After review with interventional cardiology, it was felt that medical therapy was warranted. Balloon angioplasty could be performed to the D1 if the patient failed medical therapy. Unfortunately, the patient suffered an embolic CVA to the left posterior communicating artery distribution post catheterization. He was followed by neurology. Plavix was recommended.  Carotid US (01/2013): RICA 1-39%, LICA 60-79%. Echocardiogram (01/31/2013): Mild focal basal and mild concentric hypertrophy of the septum, EF 50-55%, normal wall motion, grade 1 diastolic dysfunction, trivial AI, MAC, mild LAE, PASP 35.  Patient was discharged to inpatient rehabilitation from 11/13-11/14.  He is overall doing well.  He still has some residual R sided weakness and numbness.  He is working with PT/OT.  He denies chest pain, significant dyspnea, orthopnea, PND, edema, syncope.    Recent Labs: 01/30/2013: HDL 54; LDL (calc) 76  02/02/2013: ALT 25; Creatinine 0.88; Hemoglobin 12.6*; Potassium 3.8  02/08/2013: TSH 5.645*   Wt Readings from Last 3 Encounters:  02/28/13 220  lb 12.8 oz (100.154 kg)  02/13/13 208 lb 15.9 oz (94.8 kg)  01/30/13 222 lb 3.2 oz (100.789 kg)     Past Medical History  Diagnosis Date  . Dyslipidemia   . Seizure disorder   . Hypothyroidism   . DDD (degenerative disc disease)   . Hx MRSA infection     left buttocks abscess  . Rotator cuff injury     chronic rotator cuff injury status post repair  . Hypertension   . Syncope   . Dizziness   . Meniere's disease     Status post shunt  . BPH (benign prostatic hyperplasia)   . Diabetes mellitus without complication   . Hyperlipidemia   . Coronary artery disease     a. s/p MI tx with Cypher DES to pRCA in 4/04;  b. Echocardiogram 7/09: Normal LV function.  c. Nuclear study 3/13 no ischemia;  d. ETT 2/14 neg;  e. admx with CP => LHC (01/30/2013):  pLAD 40-50%, oD1 70-80%, oOM1 40%, pRCA stent patent, mid RCA 30%, EF 60-65%. => med Rx.  . Stroke     a. 01/2013=> post cardiac cath CVA to L post communicating artery system; R sided weakness  . Hx of echocardiogram     a. Echocardiogram (01/31/2013): Mild focal basal and mild concentric hypertrophy of the septum, EF 50-55%, normal wall motion, grade 1 diastolic dysfunction, trivial AI, MAC, mild LAE, PASP 35  . Occlusion and stenosis of carotid artery without mention of cerebral infarction     40-59% on carotid doppler 2014; [place] (01/2013): R 1-39%, L 60-79%  Current Outpatient Prescriptions  Medication Sig Dispense Refill  . atorvastatin (LIPITOR) 40 MG tablet Take 2 tablets (80 mg total) by mouth daily.  60 tablet  5  . carbamazepine (CARBATROL) 300 MG 12 hr capsule Take 1 capsule (300 mg total) by mouth 2 (two) times daily.  60 capsule  1  . cloNIDine (CATAPRES) 0.2 MG tablet Take 1 tablet (0.2 mg total) by mouth daily.  30 tablet  1  . clopidogrel (PLAVIX) 75 MG tablet Take 1 tablet (75 mg total) by mouth daily.  30 tablet  5  . diazepam (VALIUM) 2 MG tablet Take 1 tablet (2 mg total) by mouth 2 (two) times daily.  60 tablet  1  .  fluticasone (FLONASE) 50 MCG/ACT nasal spray Place 1 spray into the nose daily as needed for allergies.       . hydrochlorothiazide (HYDRODIURIL) 25 MG tablet Take 1 tablet (25 mg total) by mouth daily.  30 tablet  5  . isosorbide mononitrate (IMDUR) 30 MG 24 hr tablet Take 1 tablet (30 mg total) by mouth daily.  30 tablet  3  . levothyroxine (SYNTHROID, LEVOTHROID) 25 MCG tablet Take 2 tablets (50 mcg total) by mouth daily before breakfast.  30 tablet  1  . losartan (COZAAR) 50 MG tablet Take 1 tablet (50 mg total) by mouth 2 (two) times daily.  60 tablet  11  . meclizine (ANTIVERT) 25 MG tablet Take 1 tablet (25 mg total) by mouth 3 (three) times daily as needed for dizziness.  30 tablet  0  . metFORMIN (GLUCOPHAGE) 500 MG tablet Take 1 tablet (500 mg total) by mouth 2 (two) times daily with a meal.  60 tablet  1  . metoprolol (LOPRESSOR) 50 MG tablet Take 1 tablet (50 mg total) by mouth 2 (two) times daily.  60 tablet  1  . Multiple Vitamins-Minerals (MULTIVITAMINS THER. W/MINERALS) TABS Take 1 tablet by mouth at bedtime.        . nitroGLYCERIN (NITROSTAT) 0.4 MG SL tablet Place 1 tablet (0.4 mg total) under the tongue every 5 (five) minutes as needed for chest pain.  25 tablet  3  . pantoprazole (PROTONIX) 40 MG tablet Take 1 tablet (40 mg total) by mouth daily.  30 tablet  3  . silodosin (RAPAFLO) 8 MG CAPS capsule Take 8 mg by mouth daily with breakfast.        No current facility-administered medications for this visit.    Allergies:   Review of patient's allergies indicates no known allergies.   Social History:  The patient  reports that he quit smoking about 51 years ago. He does not have any smokeless tobacco history on file. He reports that he does not drink alcohol or use illicit drugs.   Family History:  The patient's family history includes Aneurysm in his mother; Coronary artery disease in his brother; Heart attack (age of onset: 62) in his father.   ROS:  Please see the history  of present illness.   He has had some diarrhea.  He has had a dry cough.  Denies any bleeding problems.   All other systems reviewed and negative.   PHYSICAL EXAM: VS:  BP 130/76  Pulse 74  Ht 5\' 8"  (1.727 m)  Wt 220 lb 12.8 oz (100.154 kg)  BMI 33.58 kg/m2 Well nourished, well developed, in no acute distress HEENT: normal Neck: no JVD Cardiac:  normal S1, S2; RRR; no murmur Lungs:  clear to auscultation bilaterally, no wheezing,  rhonchi or rales Abd: soft, nontender, no hepatomegaly Ext: no edema; right wrist without hematoma or mass  Skin: warm and dry Neuro:  CNs 2-12 intact, no focal abnormalities noted  EKG:  NSR, HR 74, LAD, IVCD, TWI in 1, aVL     ASSESSMENT AND PLAN:  1. CAD:  He is not having any angina on current Rx. Continue with current dose of Plavix, Imdur, metoprolol, statin.   2. S/p Embolic CVA:  This was post cardiac cath.  He is slowly improving.  He continues to follow up with PT/OT.  He remains on Plavix.  F/u with neurology as planned. 3. Hypertension:  Fair control.  BPs at home are stable by his report.  Continue to monitor.   4. Hyperlipidemia:  Continue statin. 5. Carotid Stenosis:  Plan f/u carotid US in 1 year. 6. Disposition:  I spent > 20 minutes reviewing records today and 50% of this was face to face time.  F/u with Dr. Rollene Rotunda in 3 mos.  Signed, Tereso Newcomer, PA-C  02/28/2013 2:30 PM

## 2013-02-28 NOTE — Patient Instructions (Signed)
CHANGE CLONIDINE TO 0.1 MG 1 TABLET TWICE DAILY; NEW RX WAS SENT IN TO [facility] [address]  MAKE SURE TO CALL IF BP IS STAYING 140/90 OR HIGHER [phone] SCOTT WEAVER, PAC  PLEASE FOLLOW UP WITH DR. HOCHREIN 06/04/13 @ 11:30 AM

## 2013-03-01 ENCOUNTER — Ambulatory Visit: Payer: Medicare Other | Admitting: Speech Pathology

## 2013-03-01 ENCOUNTER — Encounter: Payer: Self-pay | Admitting: Family Medicine

## 2013-03-01 ENCOUNTER — Ambulatory Visit: Payer: Medicare Other | Admitting: Occupational Therapy

## 2013-03-01 ENCOUNTER — Ambulatory Visit: Payer: Medicare Other | Admitting: Family Medicine

## 2013-03-01 ENCOUNTER — Ambulatory Visit (INDEPENDENT_AMBULATORY_CARE_PROVIDER_SITE_OTHER): Payer: Medicare Other | Admitting: Family Medicine

## 2013-03-01 VITALS — BP 118/72 | HR 60 | Temp 97.2°F | Resp 16 | Wt 220.0 lb

## 2013-03-01 DIAGNOSIS — I6350 Cerebral infarction due to unspecified occlusion or stenosis of unspecified cerebral artery: Secondary | ICD-10-CM | POA: Diagnosis not present

## 2013-03-01 DIAGNOSIS — Z09 Encounter for follow-up examination after completed treatment for conditions other than malignant neoplasm: Secondary | ICD-10-CM | POA: Diagnosis not present

## 2013-03-01 DIAGNOSIS — I6529 Occlusion and stenosis of unspecified carotid artery: Secondary | ICD-10-CM

## 2013-03-01 DIAGNOSIS — I10 Essential (primary) hypertension: Secondary | ICD-10-CM | POA: Diagnosis not present

## 2013-03-01 DIAGNOSIS — I639 Cerebral infarction, unspecified: Secondary | ICD-10-CM

## 2013-03-01 DIAGNOSIS — E119 Type 2 diabetes mellitus without complications: Secondary | ICD-10-CM

## 2013-03-01 DIAGNOSIS — Z23 Encounter for immunization: Secondary | ICD-10-CM | POA: Diagnosis not present

## 2013-03-01 DIAGNOSIS — E785 Hyperlipidemia, unspecified: Secondary | ICD-10-CM

## 2013-03-01 DIAGNOSIS — I6522 Occlusion and stenosis of left carotid artery: Secondary | ICD-10-CM

## 2013-03-01 DIAGNOSIS — E039 Hypothyroidism, unspecified: Secondary | ICD-10-CM

## 2013-03-01 NOTE — Progress Notes (Signed)
Subjective:    Patient ID: [name], male    DOB: March 10, 1934, 79 y.o.   MRN: [id number]  HPI Patient was recently admitted to the hospital.  I have copied relevant portions of the discharge summary and included them below:  DATE OF ADMISSION: 02/01/2013  DATE OF DISCHARGE: 02/13/2013  DISCHARGE SUMMARY  DISCHARGE DIAGNOSES:  1. 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  2. Subcutaneous heparin for deep vein thrombosis prophylaxis.  3. Coronary artery disease status post catheterization January 30, 2013.  4. Seizure disorder.  5. Hypertension.  6. Diabetes mellitus.  7. Peripheral neuropathy.  8. Hypothyroidism.  9. Benign prostatic hypertrophy.  10.Hyperlipidemia.  HISTORY OF PRESENT ILLNESS: This is a 79 year old right-handed male  history of coronary artery disease, myocardial infarction with stenting,  hypertension, seizure disorder, who was admitted January 29, 2013, with  left-sided chest pain while raking leaves. He did receive nitroglycerin  in the emergency department with some relief. EKG no significant ST-  segment changes. Troponin negative. Underwent cardiac catheterization  on January 30, 2013, that showed 40-50% proximal LAD stenosis and  advised medical management per Cardiology Services. Noted post  catheterization with right facial numbness and slurred speech. MRI  showed bilateral anterior-posterior infarctions. MRA confirms findings  of left PCA occlusion. Echocardiogram January 31, 2013, with ejection  fraction 55% grade 1 diastolic dysfunction. Carotid Dopplers with left  60-79% ICA stenosis. The patient did not receive tPA. Maintained on  Plavix therapy for CVA prophylaxis. Subcutaneous heparin added for DVT  prophylaxis. Physical and occupational therapy ongoing. The patient  was admitted for comprehensive rehab program.  Patient is here today for follow up.   I have reviewed his  hospital records. His labs are significant for a HDL of 54, LDL of 76, triglycerides of 86, hemoglobin A1c of 6.5, and mildly elevated TSH at 5.645. Overall the patient is doing well. He still has some weakness in his right arm. He has weakness of shoulder abduction and grip strength. He also reports numbness on the right side of his face. He is articulating well. He has no memory loss or mental status changes. He is still working with physical therapy. Of note he is found to have a 60-79 percent stenosis in the left internal carotid artery..   Past Medical History  Diagnosis Date  . Dyslipidemia   . Seizure disorder   . Hypothyroidism   . DDD (degenerative disc disease)   . Hx MRSA infection     left buttocks abscess  . Rotator cuff injury     chronic rotator cuff injury status post repair  . Hypertension   . Syncope   . Dizziness   . Meniere's disease     Status post shunt  . BPH (benign prostatic hyperplasia)   . Diabetes mellitus without complication   . Hyperlipidemia   . Coronary artery disease     a. s/p MI tx with Cypher DES to pRCA in 4/04;  b. Echocardiogram 7/09: Normal LV function.  c. Nuclear study 3/13 no ischemia;  d. ETT 2/14 neg;  e. admx with CP => LHC (01/30/2013):  pLAD 40-50%, oD1 70-80%, oOM1 40%, pRCA stent patent, mid RCA 30%, EF 60-65%. => med Rx.  . Stroke     a. 01/2013=> post cardiac cath CVA to L post communicating artery system; R sided weakness  . Hx of echocardiogram     a. Echocardiogram (01/31/2013): Mild focal basal and mild concentric hypertrophy  of the septum, EF 50-55%, normal wall motion, grade 1 diastolic dysfunction, trivial AI, MAC, mild LAE, PASP 35  . Occlusion and stenosis of carotid artery without mention of cerebral infarction     40-59% on carotid doppler 2014; [place] (01/2013): R 1-39%, L 60-79%   Past Surgical History  Procedure Laterality Date  . Coronary angioplasty with stent placement  07/14/2002    Stent to the right coronary artery    . Rotator cuff repair      for chronic rotator cuff injury  . Hernia repair  04/10/2008    scrotal hernia repair  . Endolymphatic shunt decompression  06/11/2009     Right endolymphatic sac decompression and shunt  placement   Current Outpatient Prescriptions on File Prior to Visit  Medication Sig Dispense Refill  . atorvastatin (LIPITOR) 40 MG tablet Take 2 tablets (80 mg total) by mouth daily.  60 tablet  5  . carbamazepine (CARBATROL) 300 MG 12 hr capsule Take 1 capsule (300 mg total) by mouth 2 (two) times daily.  60 capsule  1  . cloNIDine (CATAPRES) 0.1 MG tablet Take 1 tablet (0.1 mg total) by mouth 2 (two) times daily.  60 tablet  11  . clopidogrel (PLAVIX) 75 MG tablet Take 1 tablet (75 mg total) by mouth daily.  30 tablet  5  . diazepam (VALIUM) 2 MG tablet Take 1 tablet (2 mg total) by mouth 2 (two) times daily.  60 tablet  1  . fluticasone (FLONASE) 50 MCG/ACT nasal spray Place 1 spray into the nose daily as needed for allergies.       . hydrochlorothiazide (HYDRODIURIL) 25 MG tablet Take 1 tablet (25 mg total) by mouth daily.  30 tablet  5  . isosorbide mononitrate (IMDUR) 30 MG 24 hr tablet Take 1 tablet (30 mg total) by mouth daily.  30 tablet  3  . levothyroxine (SYNTHROID, LEVOTHROID) 25 MCG tablet Take 2 tablets (50 mcg total) by mouth daily before breakfast.  30 tablet  1  . losartan (COZAAR) 50 MG tablet Take 1 tablet (50 mg total) by mouth 2 (two) times daily.  60 tablet  11  . meclizine (ANTIVERT) 25 MG tablet Take 1 tablet (25 mg total) by mouth 3 (three) times daily as needed for dizziness.  30 tablet  0  . metFORMIN (GLUCOPHAGE) 500 MG tablet Take 1 tablet (500 mg total) by mouth 2 (two) times daily with a meal.  60 tablet  1  . metoprolol (LOPRESSOR) 50 MG tablet Take 1 tablet (50 mg total) by mouth 2 (two) times daily.  60 tablet  1  . Multiple Vitamins-Minerals (MULTIVITAMINS THER. W/MINERALS) TABS Take 1 tablet by mouth at bedtime.        . nitroGLYCERIN  (NITROSTAT) 0.4 MG SL tablet Place 1 tablet (0.4 mg total) under the tongue every 5 (five) minutes as needed for chest pain.  25 tablet  3  . pantoprazole (PROTONIX) 40 MG tablet Take 1 tablet (40 mg total) by mouth daily.  30 tablet  3  . silodosin (RAPAFLO) 8 MG CAPS capsule Take 8 mg by mouth daily with breakfast.        No current facility-administered medications on file prior to visit.   No Known Allergies History   Social History  . Marital Status: Married    Spouse Name: N/A    Number of Children: N/A  . Years of Education: N/A   Occupational History  . Retired    Chief Executive Officer  History Main Topics  . Smoking status: Former Smoker    Quit date: 08/19/1961  . Smokeless tobacco: Not on file  . Alcohol Use: No  . Drug Use: No  . Sexual Activity: Not on file   Other Topics Concern  . Not on file   Social History Narrative   Lives with wife.     Review of Systems  All other systems reviewed and are negative.       Objective:   Physical Exam  Vitals reviewed. Constitutional: He is oriented to person, place, and time.  Cardiovascular: Normal rate, regular rhythm, normal heart sounds and intact distal pulses.  Exam reveals no gallop.   No murmur heard. Pulmonary/Chest: Effort normal and breath sounds normal. No respiratory distress. He has no wheezes. He has no rales. He exhibits no tenderness.  Abdominal: Soft. Bowel sounds are normal. He exhibits no distension. There is no tenderness. There is no rebound and no guarding.  Neurological: He is alert and oriented to person, place, and time. He has normal reflexes. He displays normal reflexes. No cranial nerve deficit. He exhibits abnormal muscle tone. Coordination normal.          Assessment & Plan:  1. Hospital discharge follow-up Patient is due for Prevnar 13. He was given Prevnar 13 today.  2. CVA (cerebral infarction) Continue Plavix 75 mg by mouth daily for prevention of stroke. Given the fact he had a stroke  in the left cerebrum and he has 60-79% stenosis in the left internal carotid artery, a subsequent stroke in that distribution would be debilitating. The I'm going to arrange for vascular surgery consultation to get their opinion on whether or not this should be repaired.  3. Type II or unspecified type diabetes mellitus without mention of complication, not stated as uncontrolled Hemoglobin A1c is excellent. No changes in medication.  4. HTN (hypertension) Pressure is excellent. No changes in med.  5. HLD (hyperlipidemia) Cholesterol is excellent, no changes in medication.  6. Unspecified hypothyroidism TSH is slightly elevated. Recheck in 3 months.  7. Need for prophylactic vaccination against Streptococcus pneumoniae (pneumococcus) - Pneumococcal conjugate vaccine 13-valent IM  8. Carotid stenosis, left Consult vascular surgery. - Ambulatory referral to Vascular Surgery

## 2013-03-02 ENCOUNTER — Ambulatory Visit: Payer: Medicare Other | Admitting: *Deleted

## 2013-03-02 ENCOUNTER — Other Ambulatory Visit: Payer: Self-pay | Admitting: Family Medicine

## 2013-03-02 VITALS — BP 130/68

## 2013-03-02 DIAGNOSIS — I10 Essential (primary) hypertension: Secondary | ICD-10-CM

## 2013-03-06 ENCOUNTER — Ambulatory Visit: Payer: Medicare Other | Admitting: Occupational Therapy

## 2013-03-06 ENCOUNTER — Ambulatory Visit: Payer: Medicare Other | Admitting: Physical Therapy

## 2013-03-06 ENCOUNTER — Ambulatory Visit: Payer: Medicare Other | Admitting: Speech Pathology

## 2013-03-07 ENCOUNTER — Encounter: Payer: Self-pay | Admitting: Nurse Practitioner

## 2013-03-07 ENCOUNTER — Ambulatory Visit (INDEPENDENT_AMBULATORY_CARE_PROVIDER_SITE_OTHER): Payer: Medicare Other | Admitting: Nurse Practitioner

## 2013-03-07 ENCOUNTER — Ambulatory Visit: Payer: Self-pay | Admitting: Neurology

## 2013-03-07 VITALS — BP 137/74 | HR 82 | Ht 65.0 in | Wt 221.0 lb

## 2013-03-07 DIAGNOSIS — I658 Occlusion and stenosis of other precerebral arteries: Secondary | ICD-10-CM

## 2013-03-07 DIAGNOSIS — I6523 Occlusion and stenosis of bilateral carotid arteries: Secondary | ICD-10-CM

## 2013-03-07 DIAGNOSIS — I6340 Cerebral infarction due to embolism of unspecified cerebral artery: Secondary | ICD-10-CM | POA: Diagnosis not present

## 2013-03-07 DIAGNOSIS — I6529 Occlusion and stenosis of unspecified carotid artery: Secondary | ICD-10-CM | POA: Diagnosis not present

## 2013-03-07 NOTE — Progress Notes (Signed)
PATIENT: [name] DOB: Nov 23, 1933   REASON FOR VISIT: hospital follow up for stroke HISTORY FROM: patient  HISTORY OF PRESENT ILLNESS: [name] is an 79 y.o. male with a past medical history significant for dyslipidemia, HTN, DM, CAD s/p MI, syncope, hypothyroidism, TGA couple of months ago, Meniere's disease, who underwent right radial cardiac cath on 01/30/2013 and in the afternoon was supposed to be discharged when he reported having a numb sensation around his lips and right face and then was noted to be off balance and leaning to the right. Family stated that his speech was " slurred but we thought it was from sedatives". He denies associated HA, vertigo, double vision, difficulty swallowing, focal weakness, confusion, or visual disturbance. Evaluated by the rapid response nurse and NIHSS 1 reported.  His right face/lips still " little bit numb". Patient was not a TPA candidate secondary to NIHSS =1 with only numbness (too mild to treat). He was admitted for further evaluation and treatment. Symptoms worsened somewhat during the first 24 hours.  Imaging confirmed bilateral anterior and posterior infarcts, the largest being in the left thalamus. MRA confirms findings with left PCA occlusion 1.5 cm beyond its origin and right PCA severe stenosis 1 cm beyond its origin.  He was found to have 60-79% stenosis in the left internal carotid artery, lowest range, on doppler, a referral was made by PCP to vascular surgery.  He is still attending outpatient PT/and OT.  He is tolerating Plavix well with significant bruising.  REVIEW OF SYSTEMS: Full 14 system review of systems performed and notable only for:  Respiratory: cough Gastroitestinal: diarrhea Musculoskeletal:  Walking difficluty  Neurological: numbness   ALLERGIES: No Known Allergies  HOME MEDICATIONS: Outpatient Prescriptions Prior to Visit  Medication Sig Dispense Refill  . atorvastatin (LIPITOR) 40 MG tablet Take 2  tablets (80 mg total) by mouth daily.  60 tablet  5  . carbamazepine (CARBATROL) 300 MG 12 hr capsule Take 1 capsule (300 mg total) by mouth 2 (two) times daily.  60 capsule  1  . cloNIDine (CATAPRES) 0.1 MG tablet Take 1 tablet (0.1 mg total) by mouth 2 (two) times daily.  60 tablet  11  . clopidogrel (PLAVIX) 75 MG tablet Take 1 tablet (75 mg total) by mouth daily.  30 tablet  5  . diazepam (VALIUM) 2 MG tablet Take 1 tablet (2 mg total) by mouth 2 (two) times daily.  60 tablet  1  . fluticasone (FLONASE) 50 MCG/ACT nasal spray Place 1 spray into the nose daily as needed for allergies.       . hydrochlorothiazide (HYDRODIURIL) 25 MG tablet Take 1 tablet (25 mg total) by mouth daily.  30 tablet  5  . hydrochlorothiazide (HYDRODIURIL) 25 MG tablet TAKE 1 TABLET (25 MG TOTAL) BY MOUTH DAILY.  30 tablet  5  . isosorbide mononitrate (IMDUR) 30 MG 24 hr tablet Take 1 tablet (30 mg total) by mouth daily.  30 tablet  3  . levothyroxine (SYNTHROID, LEVOTHROID) 25 MCG tablet Take 2 tablets (50 mcg total) by mouth daily before breakfast.  30 tablet  1  . losartan (COZAAR) 50 MG tablet Take 1 tablet (50 mg total) by mouth 2 (two) times daily.  60 tablet  11  . meclizine (ANTIVERT) 25 MG tablet Take 1 tablet (25 mg total) by mouth 3 (three) times daily as needed for dizziness.  30 tablet  0  . metFORMIN (GLUCOPHAGE) 500 MG tablet Take 1  tablet (500 mg total) by mouth 2 (two) times daily with a meal.  60 tablet  1  . metoprolol (LOPRESSOR) 50 MG tablet Take 1 tablet (50 mg total) by mouth 2 (two) times daily.  60 tablet  1  . Multiple Vitamins-Minerals (MULTIVITAMINS THER. W/MINERALS) TABS Take 1 tablet by mouth at bedtime.        . nitroGLYCERIN (NITROSTAT) 0.4 MG SL tablet Place 1 tablet (0.4 mg total) under the tongue every 5 (five) minutes as needed for chest pain.  25 tablet  3  . pantoprazole (PROTONIX) 40 MG tablet Take 1 tablet (40 mg total) by mouth daily.  30 tablet  3  . silodosin (RAPAFLO) 8 MG CAPS  capsule Take 8 mg by mouth daily with breakfast.        No facility-administered medications prior to visit.    PAST MEDICAL HISTORY: Past Medical History  Diagnosis Date  . Dyslipidemia   . Seizure disorder   . Hypothyroidism   . DDD (degenerative disc disease)   . Hx MRSA infection     left buttocks abscess  . Rotator cuff injury     chronic rotator cuff injury status post repair  . Hypertension   . Syncope   . Dizziness   . Meniere's disease     Status post shunt  . BPH (benign prostatic hyperplasia)   . Diabetes mellitus without complication   . Hyperlipidemia   . Coronary artery disease     a. s/p MI tx with Cypher DES to pRCA in 4/04;  b. Echocardiogram 7/09: Normal LV function.  c. Nuclear study 3/13 no ischemia;  d. ETT 2/14 neg;  e. admx with CP => LHC (01/30/2013):  pLAD 40-50%, oD1 70-80%, oOM1 40%, pRCA stent patent, mid RCA 30%, EF 60-65%. => med Rx.  . Stroke     a. 01/2013=> post cardiac cath CVA to L post communicating artery system; R sided weakness  . Hx of echocardiogram     a. Echocardiogram (01/31/2013): Mild focal basal and mild concentric hypertrophy of the septum, EF 50-55%, normal wall motion, grade 1 diastolic dysfunction, trivial AI, MAC, mild LAE, PASP 35  . Occlusion and stenosis of carotid artery without mention of cerebral infarction     40-59% on carotid doppler 2014; [place] (01/2013): R 1-39%, L 60-79%    PAST SURGICAL HISTORY: Past Surgical History  Procedure Laterality Date  . Coronary angioplasty with stent placement  07/14/2002    Stent to the right coronary artery  . Rotator cuff repair      for chronic rotator cuff injury  . Hernia repair  04/10/2008    scrotal hernia repair  . Endolymphatic shunt decompression  06/11/2009     Right endolymphatic sac decompression and shunt  placement    FAMILY HISTORY: Family History  Problem Relation Age of Onset  . Heart attack Father 75  . Aneurysm Mother     brain aneurysm  . Coronary artery  disease Brother     with CABG    SOCIAL HISTORY: History   Social History  . Marital Status: Married    Spouse Name: N/A    Number of Children: 4  . Years of Education: 12th   Occupational History  . Retired    Social History Main Topics  . Smoking status: Former Smoker    Quit date: 08/19/1961  . Smokeless tobacco: Not on file  . Alcohol Use: No  . Drug Use: No  . Sexual Activity: No  Other Topics Concern  . Not on file   Social History Narrative   Lives with wife.     PHYSICAL EXAM  Filed Vitals:   03/07/13 1415  BP: 137/74  Pulse: 82  Height: 5\' 5"  (1.651 m)  Weight: 221 lb (100.245 kg)   Body mass index is 36.78 kg/(m^2).  Generalized: Well developed, in no acute distress  Head: normocephalic and atraumatic. Oropharynx benign  Neck: Supple, no carotid bruits  Cardiac: Regular rate rhythm, no murmur  Musculoskeletal: No deformity   Neurological examination   Mentation: Alert oriented to time, place, history taking. Follows all commands speech and language fluent Cranial nerve II-XII:   Pupils were equal round reactive to light extraocular movements were full, Partial right homonymous hemianopsia to bedside confrontational testing.  Mild right facial weakness. Decreased sensation around the right mouth and tongue, hearing was intact to finger rubbing bilaterally. Uvula tongue midline. head turning and shoulder shrug and were normal and symmetric.Tongue protrusion into cheek strength was normal. Motor: normal bulk and tone, fine finger movements decreased on the right, no pronator drift. RUE 4/5. RLE 4 to 4+/5.  Sensory: normal and symmetric to light touch Coordination: Right upper limb ataxia remains. Heel-shin normal bilaterally. Reflexes:  Deep tendon reflexes in the upper and lower extremities are present and symmetric.  Gait and Station: Rising up from seated position without assistance, wide based gait with rolling walker, unable to perform tiptoe, and  heel walking without difficulty. Romberg negative.  DIAGNOSTIC DATA (LABS, IMAGING, TESTING) - I reviewed patient records, labs, notes, testing and imaging myself where available.  Lab Results  Component Value Date   WBC 8.8 02/02/2013   HGB 12.6* 02/02/2013   HCT 37.9* 02/02/2013   MCV 90.7 02/02/2013   PLT 239 02/02/2013      Component Value Date/Time   NA 137 02/02/2013 0600   K 3.8 02/02/2013 0600   CL 99 02/02/2013 0600   CO2 29 02/02/2013 0600   GLUCOSE 128* 02/02/2013 0600   BUN 11 02/02/2013 0600   CREATININE 0.88 02/02/2013 0600   CREATININE 0.97 07/19/2012 0830   CALCIUM 8.7 02/02/2013 0600   PROT 6.4 02/02/2013 0600   ALBUMIN 3.4* 02/02/2013 0600   AST 28 02/02/2013 0600   ALT 25 02/02/2013 0600   ALKPHOS 75 02/02/2013 0600   BILITOT 0.3 02/02/2013 0600   GFRNONAA 81* 02/02/2013 0600   GFRAA >90 02/02/2013 0600   Lab Results  Component Value Date   CHOL 147 01/30/2013   HDL 54 01/30/2013   LDLCALC 76 01/30/2013   TRIG 86 01/30/2013   CHOLHDL 2.7 01/30/2013   Lab Results  Component Value Date   HGBA1C 6.5* 01/30/2013   Lab Results  Component Value Date   VITAMINB12 456 12/05/2012   Lab Results  Component Value Date   TSH 5.645* 02/08/2013   ASSESSMENT AND PLAN Mr. Christoffer Currier Quant is a 79 y.o. male presenting with acute onset perioral/right face numbness and unsteadiness after cardiac cath on 01/30/13. Imaging confirmed bilateral anterior and posterior infarcts, the largest being in the left thalamus. MRA confirms findings with left PCA occlusion 1.5 cm beyond its origin and right PCA severe stenosis 1 cm beyond its origin. This helps to confirm infarcts felt to be embolic secondary to post cath complication. Neuro worsening within first 24h with increasing dysarthria and right hemiparesis, which is improving but not resolved. This worsening is not unusual given the location/etiology of his stroke. Improvement is expected over time.  Patient with  resultant right peripheral vision field cut, right sided numbness (hemisensory deficit), and mild right hemiparesis.   PLAN: Continue clopidogrel 75 mg orally every day  for secondary stroke prevention and maintain strict control of hypertension with blood pressure goal below 130/90, diabetes with hemoglobin A1c goal below 6.5% and lipids with LDL cholesterol goal below 100 mg/dL.  Continue outpatient therapies. Disposition: I spent > 20 minutes reviewing records today and 50% of this was face to face time with patient and spouse.  Discussed prognosis, healthy eating and rehabilitation expectations and answered questions. Followup in the future with me in 3 months.  Ronal Fear, MSN, NP-C 03/07/2013, 3:24 PM [facility] [address], [address] [place], [place] [address] [phone]  Note: This document was prepared with digital dictation and possible smart phrase technology. Any transcriptional errors that result from this process are unintentional.

## 2013-03-07 NOTE — Patient Instructions (Addendum)
PLAN: Continue clopidogrel 75 mg orally every day  for secondary stroke prevention and maintain strict control of hypertension with blood pressure goal below 130/90, diabetes with hemoglobin A1c goal below 6.5% and lipids with LDL cholesterol goal below 100 mg/dL.   Followup in the future with me in 3 months.  Please make appt. With your primary care MD to be checked for your diarrhea.

## 2013-03-08 ENCOUNTER — Ambulatory Visit: Payer: Medicare Other | Admitting: Physical Therapy

## 2013-03-08 ENCOUNTER — Encounter: Payer: Medicare Other | Admitting: Occupational Therapy

## 2013-03-08 ENCOUNTER — Telehealth: Payer: Self-pay | Admitting: Family Medicine

## 2013-03-08 ENCOUNTER — Other Ambulatory Visit: Payer: Self-pay | Admitting: *Deleted

## 2013-03-08 DIAGNOSIS — I63239 Cerebral infarction due to unspecified occlusion or stenosis of unspecified carotid arteries: Secondary | ICD-10-CM

## 2013-03-08 NOTE — Telephone Encounter (Signed)
Has been having loose stools since in hospital.  Now wife has also.  Wants appt tomorrow to see provider.  appt scheduled

## 2013-03-09 ENCOUNTER — Encounter: Payer: Self-pay | Admitting: Family Medicine

## 2013-03-09 ENCOUNTER — Ambulatory Visit (INDEPENDENT_AMBULATORY_CARE_PROVIDER_SITE_OTHER): Payer: Medicare Other | Admitting: Family Medicine

## 2013-03-09 VITALS — BP 120/70 | HR 70 | Temp 97.4°F | Resp 16 | Wt 221.0 lb

## 2013-03-09 DIAGNOSIS — R197 Diarrhea, unspecified: Secondary | ICD-10-CM

## 2013-03-09 LAB — CBC WITH DIFFERENTIAL/PLATELET
Basophils Absolute: 0 10*3/uL (ref 0.0–0.1)
Basophils Relative: 0 % (ref 0–1)
Eosinophils Absolute: 0.4 10*3/uL (ref 0.0–0.7)
Eosinophils Relative: 5 % (ref 0–5)
HCT: 36.5 % — ABNORMAL LOW (ref 39.0–52.0)
Hemoglobin: 12.6 g/dL — ABNORMAL LOW (ref 13.0–17.0)
Lymphocytes Relative: 21 % (ref 12–46)
Lymphs Abs: 1.9 10*3/uL (ref 0.7–4.0)
MCH: 30.1 pg (ref 26.0–34.0)
MCHC: 34.5 g/dL (ref 30.0–36.0)
MCV: 87.1 fL (ref 78.0–100.0)
Monocytes Absolute: 0.9 10*3/uL (ref 0.1–1.0)
Monocytes Relative: 10 % (ref 3–12)
Neutro Abs: 6.1 10*3/uL (ref 1.7–7.7)
Neutrophils Relative %: 64 % (ref 43–77)
Platelets: 309 10*3/uL (ref 150–400)
RBC: 4.19 MIL/uL — ABNORMAL LOW (ref 4.22–5.81)
RDW: 14 % (ref 11.5–15.5)
WBC: 9.4 10*3/uL (ref 4.0–10.5)

## 2013-03-09 LAB — BASIC METABOLIC PANEL
BUN: 18 mg/dL (ref 6–23)
CO2: 27 mEq/L (ref 19–32)
Calcium: 9 mg/dL (ref 8.4–10.5)
Chloride: 100 mEq/L (ref 96–112)
Creat: 1.04 mg/dL (ref 0.50–1.35)
Glucose, Bld: 108 mg/dL — ABNORMAL HIGH (ref 70–99)
Potassium: 4 mEq/L (ref 3.5–5.3)
Sodium: 137 mEq/L (ref 135–145)

## 2013-03-09 MED ORDER — METRONIDAZOLE 500 MG PO TABS: 500.0000 mg | ORAL_TABLET | Freq: Three times a day (TID) | ORAL | Status: DC

## 2013-03-09 NOTE — Patient Instructions (Signed)
For the diarrhea- start the flagyl Bring the stool samples back F/U as previous

## 2013-03-09 NOTE — Assessment & Plan Note (Signed)
I'm concerned for C. difficile colitis. Based on his recent admission to the hospital. His wife is also here with similar symptoms that started 3-4 days ago. I will go ahead and start him on Flagyl. Stool culture and C. difficile PCR will be done CBC with differential as well as metabolic panel will be done due to his ongoing medications

## 2013-03-09 NOTE — Progress Notes (Signed)
   Subjective:    Patient ID: [name], male    DOB: 04-01-1933, 79 y.o.   MRN: [id number]  HPI Patient here with diarrhea for the past 4 weeks. He was discharged from the hospital on November 14 after suffering an embolic stroke. He states that prior to go to the hospital he was constipated he was given a medication to help move his bowels in the hospital and since then he's had diarrhea. He has multiple stools a day. Not had any blood in the stool or abdominal pain or fever. He states that sometimes he skips a days however the watery stools, back. He still fatigued from her stroke and feels weak. There's been no change in his bowel movements with his by mouth intake.   Review of Systems  GEN- + fatigue, fever, weight loss,weakness, recent illness HEENT- denies eye drainage, change in vision, nasal discharge, CVS- denies chest pain, palpitations RESP- denies SOB, cough, wheeze ABD- denies N/V, +change in stools, abd pain Neuro- denies headache, dizziness, syncope, seizure activity      Objective:   Physical Exam GEN- NAD, alert and oriented x3 HEENT- PERRL, EOMI, non injected sclera, pink conjunctiva, MMM, oropharynx clear CVS- RRR, no murmur RESP-CTAB ABD-NABS,soft,NT,ND EXT- No edema Pulses- Radial 2+        Assessment & Plan:

## 2013-03-12 ENCOUNTER — Other Ambulatory Visit: Payer: Medicare Other

## 2013-03-12 ENCOUNTER — Ambulatory Visit: Payer: Medicare Other | Admitting: Family Medicine

## 2013-03-12 DIAGNOSIS — R197 Diarrhea, unspecified: Secondary | ICD-10-CM | POA: Diagnosis not present

## 2013-03-13 ENCOUNTER — Ambulatory Visit: Payer: Medicare Other | Admitting: Rehabilitative and Restorative Service Providers"

## 2013-03-13 ENCOUNTER — Ambulatory Visit: Payer: Medicare Other | Admitting: Speech Pathology

## 2013-03-13 ENCOUNTER — Ambulatory Visit: Payer: Medicare Other | Admitting: Occupational Therapy

## 2013-03-13 LAB — CLOSTRIDIUM DIFFICILE BY PCR: Toxigenic C. Difficile by PCR: NOT DETECTED

## 2013-03-16 LAB — STOOL CULTURE

## 2013-03-19 ENCOUNTER — Telehealth: Payer: Self-pay | Admitting: *Deleted

## 2013-03-19 MED ORDER — LOSARTAN POTASSIUM 100 MG PO TABS: 50.0000 mg | ORAL_TABLET | Freq: Two times a day (BID) | ORAL | Status: DC

## 2013-03-19 NOTE — Telephone Encounter (Signed)
2015 formulary change

## 2013-03-20 ENCOUNTER — Ambulatory Visit: Payer: Medicare Other | Admitting: Speech Pathology

## 2013-03-20 ENCOUNTER — Ambulatory Visit: Payer: Medicare Other | Admitting: Occupational Therapy

## 2013-03-20 ENCOUNTER — Ambulatory Visit: Payer: Medicare Other | Admitting: Rehabilitative and Restorative Service Providers"

## 2013-03-21 ENCOUNTER — Ambulatory Visit: Payer: Medicare Other | Admitting: Occupational Therapy

## 2013-03-21 ENCOUNTER — Ambulatory Visit: Payer: Medicare Other | Admitting: Physical Therapy

## 2013-03-21 ENCOUNTER — Ambulatory Visit: Payer: Medicare Other

## 2013-03-24 ENCOUNTER — Other Ambulatory Visit: Payer: Self-pay | Admitting: Family Medicine

## 2013-03-26 NOTE — Telephone Encounter (Signed)
ok 

## 2013-03-26 NOTE — Telephone Encounter (Signed)
?   Ok to refill; last refill 02/13/13; last ov 03/09/13

## 2013-03-27 ENCOUNTER — Ambulatory Visit: Payer: Medicare Other | Attending: Physical Medicine & Rehabilitation | Admitting: Physical Therapy

## 2013-03-27 ENCOUNTER — Ambulatory Visit: Payer: Medicare Other | Admitting: Occupational Therapy

## 2013-03-27 ENCOUNTER — Ambulatory Visit: Payer: Medicare Other

## 2013-03-27 DIAGNOSIS — R41842 Visuospatial deficit: Secondary | ICD-10-CM | POA: Insufficient documentation

## 2013-03-27 DIAGNOSIS — R293 Abnormal posture: Secondary | ICD-10-CM | POA: Diagnosis not present

## 2013-03-27 DIAGNOSIS — IMO0001 Reserved for inherently not codable concepts without codable children: Secondary | ICD-10-CM | POA: Diagnosis not present

## 2013-03-27 DIAGNOSIS — R4189 Other symptoms and signs involving cognitive functions and awareness: Secondary | ICD-10-CM | POA: Diagnosis not present

## 2013-03-27 DIAGNOSIS — R41841 Cognitive communication deficit: Secondary | ICD-10-CM | POA: Diagnosis not present

## 2013-03-27 DIAGNOSIS — M6281 Muscle weakness (generalized): Secondary | ICD-10-CM | POA: Diagnosis not present

## 2013-03-27 DIAGNOSIS — R269 Unspecified abnormalities of gait and mobility: Secondary | ICD-10-CM | POA: Diagnosis not present

## 2013-03-27 DIAGNOSIS — R5381 Other malaise: Secondary | ICD-10-CM | POA: Insufficient documentation

## 2013-03-27 DIAGNOSIS — R279 Unspecified lack of coordination: Secondary | ICD-10-CM | POA: Insufficient documentation

## 2013-03-27 DIAGNOSIS — I69959 Hemiplegia and hemiparesis following unspecified cerebrovascular disease affecting unspecified side: Secondary | ICD-10-CM | POA: Diagnosis not present

## 2013-03-28 ENCOUNTER — Encounter: Payer: Medicare Other | Admitting: Physical Medicine & Rehabilitation

## 2013-03-29 ENCOUNTER — Ambulatory Visit: Payer: Medicare Other

## 2013-03-29 ENCOUNTER — Ambulatory Visit: Payer: Medicare Other | Admitting: Occupational Therapy

## 2013-03-29 ENCOUNTER — Ambulatory Visit: Payer: Medicare Other | Admitting: Physical Therapy

## 2013-03-29 DIAGNOSIS — R293 Abnormal posture: Secondary | ICD-10-CM | POA: Diagnosis not present

## 2013-03-29 DIAGNOSIS — IMO0001 Reserved for inherently not codable concepts without codable children: Secondary | ICD-10-CM | POA: Diagnosis not present

## 2013-03-29 DIAGNOSIS — R269 Unspecified abnormalities of gait and mobility: Secondary | ICD-10-CM | POA: Diagnosis not present

## 2013-03-29 DIAGNOSIS — R5381 Other malaise: Secondary | ICD-10-CM | POA: Diagnosis not present

## 2013-03-29 DIAGNOSIS — M6281 Muscle weakness (generalized): Secondary | ICD-10-CM | POA: Diagnosis not present

## 2013-03-29 DIAGNOSIS — R41841 Cognitive communication deficit: Secondary | ICD-10-CM | POA: Diagnosis not present

## 2013-04-03 ENCOUNTER — Ambulatory Visit: Payer: Medicare Other

## 2013-04-03 ENCOUNTER — Ambulatory Visit: Payer: Medicare Other | Admitting: Physical Therapy

## 2013-04-03 ENCOUNTER — Ambulatory Visit: Payer: Medicare Other | Admitting: Occupational Therapy

## 2013-04-03 DIAGNOSIS — R5381 Other malaise: Secondary | ICD-10-CM | POA: Diagnosis not present

## 2013-04-03 DIAGNOSIS — M6281 Muscle weakness (generalized): Secondary | ICD-10-CM | POA: Diagnosis not present

## 2013-04-03 DIAGNOSIS — R269 Unspecified abnormalities of gait and mobility: Secondary | ICD-10-CM | POA: Diagnosis not present

## 2013-04-03 DIAGNOSIS — IMO0001 Reserved for inherently not codable concepts without codable children: Secondary | ICD-10-CM | POA: Diagnosis not present

## 2013-04-03 DIAGNOSIS — R293 Abnormal posture: Secondary | ICD-10-CM | POA: Diagnosis not present

## 2013-04-03 DIAGNOSIS — R41841 Cognitive communication deficit: Secondary | ICD-10-CM | POA: Diagnosis not present

## 2013-04-05 ENCOUNTER — Ambulatory Visit: Payer: Medicare Other | Admitting: Occupational Therapy

## 2013-04-05 ENCOUNTER — Ambulatory Visit: Payer: Medicare Other

## 2013-04-05 ENCOUNTER — Ambulatory Visit: Payer: Medicare Other | Admitting: Physical Therapy

## 2013-04-05 DIAGNOSIS — M6281 Muscle weakness (generalized): Secondary | ICD-10-CM | POA: Diagnosis not present

## 2013-04-05 DIAGNOSIS — R269 Unspecified abnormalities of gait and mobility: Secondary | ICD-10-CM | POA: Diagnosis not present

## 2013-04-05 DIAGNOSIS — R5381 Other malaise: Secondary | ICD-10-CM | POA: Diagnosis not present

## 2013-04-05 DIAGNOSIS — R293 Abnormal posture: Secondary | ICD-10-CM | POA: Diagnosis not present

## 2013-04-05 DIAGNOSIS — IMO0001 Reserved for inherently not codable concepts without codable children: Secondary | ICD-10-CM | POA: Diagnosis not present

## 2013-04-05 DIAGNOSIS — R41841 Cognitive communication deficit: Secondary | ICD-10-CM | POA: Diagnosis not present

## 2013-04-10 ENCOUNTER — Ambulatory Visit: Payer: Medicare Other | Admitting: Physical Therapy

## 2013-04-10 ENCOUNTER — Encounter: Payer: Medicare Other | Admitting: Vascular Surgery

## 2013-04-10 ENCOUNTER — Ambulatory Visit: Payer: Medicare Other

## 2013-04-10 ENCOUNTER — Other Ambulatory Visit (HOSPITAL_COMMUNITY): Payer: Medicare Other

## 2013-04-10 ENCOUNTER — Ambulatory Visit: Payer: Medicare Other | Admitting: Occupational Therapy

## 2013-04-10 DIAGNOSIS — R293 Abnormal posture: Secondary | ICD-10-CM | POA: Diagnosis not present

## 2013-04-10 DIAGNOSIS — IMO0001 Reserved for inherently not codable concepts without codable children: Secondary | ICD-10-CM | POA: Diagnosis not present

## 2013-04-10 DIAGNOSIS — R41841 Cognitive communication deficit: Secondary | ICD-10-CM | POA: Diagnosis not present

## 2013-04-10 DIAGNOSIS — M6281 Muscle weakness (generalized): Secondary | ICD-10-CM | POA: Diagnosis not present

## 2013-04-10 DIAGNOSIS — R269 Unspecified abnormalities of gait and mobility: Secondary | ICD-10-CM | POA: Diagnosis not present

## 2013-04-10 DIAGNOSIS — R5381 Other malaise: Secondary | ICD-10-CM | POA: Diagnosis not present

## 2013-04-12 ENCOUNTER — Encounter: Payer: Self-pay | Admitting: Family Medicine

## 2013-04-12 ENCOUNTER — Ambulatory Visit (INDEPENDENT_AMBULATORY_CARE_PROVIDER_SITE_OTHER): Payer: Medicare Other | Admitting: Family Medicine

## 2013-04-12 VITALS — BP 120/68 | HR 74 | Temp 96.9°F | Resp 18 | Ht 65.0 in | Wt 225.0 lb

## 2013-04-12 DIAGNOSIS — D489 Neoplasm of uncertain behavior, unspecified: Secondary | ICD-10-CM | POA: Diagnosis not present

## 2013-04-12 DIAGNOSIS — L259 Unspecified contact dermatitis, unspecified cause: Secondary | ICD-10-CM | POA: Diagnosis not present

## 2013-04-12 DIAGNOSIS — D485 Neoplasm of uncertain behavior of skin: Secondary | ICD-10-CM | POA: Diagnosis not present

## 2013-04-12 NOTE — Progress Notes (Signed)
Subjective:    Patient ID: [name], male    DOB: [birth year], 80 y.o.   MRN: [id number]  HPI Patient has a diffuse widespread rash on both legs. The rash is characterized by polygonal shape slightly erythematous papules with lichenification. He is on both legs and extends as high as the mid thigh. There is no other rash anywhere else on the body. The patient is taking a beta blocker as well as an ARB. The rash is not itchy. He denies any known contacts. Past Medical History  Diagnosis Date  . Dyslipidemia   . Seizure disorder   . Hypothyroidism   . DDD (degenerative disc disease)   . Hx MRSA infection     left buttocks abscess  . Rotator cuff injury     chronic rotator cuff injury status post repair  . Hypertension   . Syncope   . Dizziness   . Meniere's disease     Status post shunt  . BPH (benign prostatic hyperplasia)   . Diabetes mellitus without complication   . Hyperlipidemia   . Coronary artery disease     a. s/p MI tx with Cypher DES to pRCA in 4/04;  b. Echocardiogram 7/09: Normal LV function.  c. Nuclear study 3/13 no ischemia;  d. ETT 2/14 neg;  e. admx with CP => LHC (01/30/2013):  pLAD 40-50%, oD1 70-80%, oOM1 40%, pRCA stent patent, mid RCA 30%, EF 60-65%. => med Rx.  . Stroke     a. 01/2013=> post cardiac cath CVA to L post communicating artery system; R sided weakness  . Hx of echocardiogram     a. Echocardiogram (01/31/2013): Mild focal basal and mild concentric hypertrophy of the septum, EF 50-55%, normal wall motion, grade 1 diastolic dysfunction, trivial AI, MAC, mild LAE, PASP 35  . Occlusion and stenosis of carotid artery without mention of cerebral infarction     40-59% on carotid doppler 2014; [place] (01/2013): R 1-39%, L 60-79%   Current Outpatient Prescriptions on File Prior to Visit  Medication Sig Dispense Refill  . atorvastatin (LIPITOR) 40 MG tablet Take 2 tablets (80 mg total) by mouth daily.  60 tablet  5  . carbamazepine (CARBATROL) 300 MG 12  hr capsule Take 1 capsule (300 mg total) by mouth 2 (two) times daily.  60 capsule  1  . cloNIDine (CATAPRES) 0.1 MG tablet Take 1 tablet (0.1 mg total) by mouth 2 (two) times daily.  60 tablet  11  . clopidogrel (PLAVIX) 75 MG tablet Take 1 tablet (75 mg total) by mouth daily.  30 tablet  5  . diazepam (VALIUM) 2 MG tablet TAKE 1 TABLET TWICE A DAY AS NEEDED  60 tablet  2  . fluticasone (FLONASE) 50 MCG/ACT nasal spray Place 1 spray into the nose daily as needed for allergies.       . hydrochlorothiazide (HYDRODIURIL) 25 MG tablet Take 1 tablet (25 mg total) by mouth daily.  30 tablet  5  . isosorbide mononitrate (IMDUR) 30 MG 24 hr tablet Take 1 tablet (30 mg total) by mouth daily.  30 tablet  3  . levothyroxine (SYNTHROID, LEVOTHROID) 25 MCG tablet Take 2 tablets (50 mcg total) by mouth daily before breakfast.  30 tablet  1  . losartan (COZAAR) 100 MG tablet Take 0.5 tablets (50 mg total) by mouth 2 (two) times daily.  30 tablet  6  . meclizine (ANTIVERT) 25 MG tablet Take 1 tablet (25 mg total) by mouth 3 (three)  times daily as needed for dizziness.  30 tablet  0  . metFORMIN (GLUCOPHAGE) 500 MG tablet Take 1 tablet (500 mg total) by mouth 2 (two) times daily with a meal.  60 tablet  1  . metoprolol (LOPRESSOR) 50 MG tablet Take 1 tablet (50 mg total) by mouth 2 (two) times daily.  60 tablet  1  . Multiple Vitamins-Minerals (MULTIVITAMINS THER. W/MINERALS) TABS Take 1 tablet by mouth at bedtime.        . nitroGLYCERIN (NITROSTAT) 0.4 MG SL tablet Place 1 tablet (0.4 mg total) under the tongue every 5 (five) minutes as needed for chest pain.  25 tablet  3  . pantoprazole (PROTONIX) 40 MG tablet Take 1 tablet (40 mg total) by mouth daily.  30 tablet  3  . silodosin (RAPAFLO) 8 MG CAPS capsule Take 8 mg by mouth daily with breakfast.        No current facility-administered medications on file prior to visit.   No Known Allergies History   Social History  . Marital Status: Married    Spouse  Name: N/A    Number of Children: 4  . Years of Education: 12th   Occupational History  . Retired    Social History Main Topics  . Smoking status: Former Smoker    Quit date: 08/19/1961  . Smokeless tobacco: Not on file  . Alcohol Use: No  . Drug Use: No  . Sexual Activity: No   Other Topics Concern  . Not on file   Social History Narrative   Lives with wife.      Review of Systems  All other systems reviewed and are negative.       Objective:   Physical Exam  Vitals reviewed. Cardiovascular: Normal rate, regular rhythm and normal heart sounds.   Pulmonary/Chest: Effort normal and breath sounds normal.  Skin: Skin is warm. Rash noted. There is erythema.   see the description of the rash about. The plaques are approximately 2 cm in diameter. They are all over both legs. The majority is clustered around his left knee.        Assessment & Plan:  Neoplasm of uncertain behavior, site unspecified - R/O lichen planus - Plan: Pathology Report  Neoplasm of uncertain behavior of skin  I believe this is lichen planus. However I am unsure. Patient consents to a skin biopsy. I anesthetized the area with 0.1% lidocaine with epinephrine. I then performed a shave biopsy of a lesion just superior to his left knee. I removed approximately 1.5 cm of skin tissue and sent it to pathology and labeled container.  I will await the results of skin biopsy before determining the next course of action.

## 2013-04-13 ENCOUNTER — Ambulatory Visit: Payer: Medicare Other | Admitting: Occupational Therapy

## 2013-04-13 ENCOUNTER — Ambulatory Visit: Payer: Medicare Other

## 2013-04-13 ENCOUNTER — Ambulatory Visit: Payer: Medicare Other | Admitting: Physical Therapy

## 2013-04-13 DIAGNOSIS — M6281 Muscle weakness (generalized): Secondary | ICD-10-CM | POA: Diagnosis not present

## 2013-04-13 DIAGNOSIS — R293 Abnormal posture: Secondary | ICD-10-CM | POA: Diagnosis not present

## 2013-04-13 DIAGNOSIS — IMO0001 Reserved for inherently not codable concepts without codable children: Secondary | ICD-10-CM | POA: Diagnosis not present

## 2013-04-13 DIAGNOSIS — R269 Unspecified abnormalities of gait and mobility: Secondary | ICD-10-CM | POA: Diagnosis not present

## 2013-04-13 DIAGNOSIS — R41841 Cognitive communication deficit: Secondary | ICD-10-CM | POA: Diagnosis not present

## 2013-04-13 DIAGNOSIS — R5381 Other malaise: Secondary | ICD-10-CM | POA: Diagnosis not present

## 2013-04-16 ENCOUNTER — Other Ambulatory Visit (HOSPITAL_COMMUNITY): Payer: Self-pay | Admitting: Family Medicine

## 2013-04-16 LAB — PATHOLOGY

## 2013-04-17 ENCOUNTER — Other Ambulatory Visit: Payer: Self-pay | Admitting: Family Medicine

## 2013-04-17 ENCOUNTER — Ambulatory Visit: Payer: Medicare Other | Admitting: Physical Therapy

## 2013-04-17 ENCOUNTER — Encounter: Payer: Medicare Other | Admitting: Occupational Therapy

## 2013-04-17 ENCOUNTER — Encounter: Payer: Medicare Other | Admitting: Speech Pathology

## 2013-04-17 MED ORDER — MOMETASONE FUROATE 0.1 % EX OINT: TOPICAL_OINTMENT | Freq: Every day | CUTANEOUS | Status: DC

## 2013-04-19 ENCOUNTER — Encounter: Payer: Medicare Other | Admitting: Occupational Therapy

## 2013-04-19 ENCOUNTER — Ambulatory Visit: Payer: Medicare Other | Admitting: Physical Therapy

## 2013-04-30 ENCOUNTER — Other Ambulatory Visit (HOSPITAL_COMMUNITY): Payer: Self-pay | Admitting: Family Medicine

## 2013-05-11 DIAGNOSIS — H353190 Nonexudative age-related macular degeneration, unspecified eye, stage unspecified: Secondary | ICD-10-CM | POA: Diagnosis not present

## 2013-05-11 DIAGNOSIS — H2510 Age-related nuclear cataract, unspecified eye: Secondary | ICD-10-CM | POA: Diagnosis not present

## 2013-05-14 DIAGNOSIS — R972 Elevated prostate specific antigen [PSA]: Secondary | ICD-10-CM | POA: Diagnosis not present

## 2013-05-16 DIAGNOSIS — R972 Elevated prostate specific antigen [PSA]: Secondary | ICD-10-CM | POA: Diagnosis not present

## 2013-05-16 DIAGNOSIS — N401 Enlarged prostate with lower urinary tract symptoms: Secondary | ICD-10-CM | POA: Diagnosis not present

## 2013-05-16 DIAGNOSIS — N139 Obstructive and reflux uropathy, unspecified: Secondary | ICD-10-CM | POA: Diagnosis not present

## 2013-06-02 ENCOUNTER — Emergency Department (HOSPITAL_COMMUNITY)
Admission: EM | Admit: 2013-06-02 | Discharge: 2013-06-02 | Disposition: A | Discharge status: Home / Self Care | Payer: Medicare Other | Attending: Emergency Medicine | Admitting: Emergency Medicine

## 2013-06-02 ENCOUNTER — Encounter (HOSPITAL_COMMUNITY): Payer: Self-pay | Admitting: Emergency Medicine

## 2013-06-02 DIAGNOSIS — R05 Cough: Secondary | ICD-10-CM | POA: Insufficient documentation

## 2013-06-02 DIAGNOSIS — Z8673 Personal history of transient ischemic attack (TIA), and cerebral infarction without residual deficits: Secondary | ICD-10-CM | POA: Insufficient documentation

## 2013-06-02 DIAGNOSIS — Z87891 Personal history of nicotine dependence: Secondary | ICD-10-CM | POA: Diagnosis not present

## 2013-06-02 DIAGNOSIS — Z9861 Coronary angioplasty status: Secondary | ICD-10-CM | POA: Diagnosis not present

## 2013-06-02 DIAGNOSIS — R5381 Other malaise: Secondary | ICD-10-CM | POA: Diagnosis not present

## 2013-06-02 DIAGNOSIS — Z79899 Other long term (current) drug therapy: Secondary | ICD-10-CM | POA: Insufficient documentation

## 2013-06-02 DIAGNOSIS — R059 Cough, unspecified: Secondary | ICD-10-CM | POA: Diagnosis not present

## 2013-06-02 DIAGNOSIS — Z8669 Personal history of other diseases of the nervous system and sense organs: Secondary | ICD-10-CM | POA: Insufficient documentation

## 2013-06-02 DIAGNOSIS — E039 Hypothyroidism, unspecified: Secondary | ICD-10-CM | POA: Insufficient documentation

## 2013-06-02 DIAGNOSIS — G40909 Epilepsy, unspecified, not intractable, without status epilepticus: Secondary | ICD-10-CM | POA: Insufficient documentation

## 2013-06-02 DIAGNOSIS — Z8614 Personal history of Methicillin resistant Staphylococcus aureus infection: Secondary | ICD-10-CM | POA: Insufficient documentation

## 2013-06-02 DIAGNOSIS — Z87828 Personal history of other (healed) physical injury and trauma: Secondary | ICD-10-CM | POA: Diagnosis not present

## 2013-06-02 DIAGNOSIS — R040 Epistaxis: Secondary | ICD-10-CM | POA: Insufficient documentation

## 2013-06-02 DIAGNOSIS — R42 Dizziness and giddiness: Secondary | ICD-10-CM | POA: Diagnosis not present

## 2013-06-02 DIAGNOSIS — I10 Essential (primary) hypertension: Secondary | ICD-10-CM | POA: Diagnosis not present

## 2013-06-02 DIAGNOSIS — Z7902 Long term (current) use of antithrombotics/antiplatelets: Secondary | ICD-10-CM | POA: Diagnosis not present

## 2013-06-02 DIAGNOSIS — Z8739 Personal history of other diseases of the musculoskeletal system and connective tissue: Secondary | ICD-10-CM | POA: Insufficient documentation

## 2013-06-02 DIAGNOSIS — N400 Enlarged prostate without lower urinary tract symptoms: Secondary | ICD-10-CM | POA: Diagnosis not present

## 2013-06-02 DIAGNOSIS — R5383 Other fatigue: Secondary | ICD-10-CM

## 2013-06-02 DIAGNOSIS — E119 Type 2 diabetes mellitus without complications: Secondary | ICD-10-CM | POA: Insufficient documentation

## 2013-06-02 LAB — PROTIME-INR
INR: 0.94 (ref 0.00–1.49)
Prothrombin Time: 12.4 seconds (ref 11.6–15.2)

## 2013-06-02 LAB — CBC WITH DIFFERENTIAL/PLATELET
Basophils Absolute: 0 10*3/uL (ref 0.0–0.1)
Basophils Relative: 0 % (ref 0–1)
Eosinophils Absolute: 0.4 10*3/uL (ref 0.0–0.7)
Eosinophils Relative: 5 % (ref 0–5)
HCT: 36.5 % — ABNORMAL LOW (ref 39.0–52.0)
Hemoglobin: 12.4 g/dL — ABNORMAL LOW (ref 13.0–17.0)
Lymphocytes Relative: 24 % (ref 12–46)
Lymphs Abs: 1.9 10*3/uL (ref 0.7–4.0)
MCH: 30.8 pg (ref 26.0–34.0)
MCHC: 34 g/dL (ref 30.0–36.0)
MCV: 90.8 fL (ref 78.0–100.0)
Monocytes Absolute: 0.6 10*3/uL (ref 0.1–1.0)
Monocytes Relative: 7 % (ref 3–12)
Neutro Abs: 5 10*3/uL (ref 1.7–7.7)
Neutrophils Relative %: 63 % (ref 43–77)
Platelets: 231 10*3/uL (ref 150–400)
RBC: 4.02 MIL/uL — ABNORMAL LOW (ref 4.22–5.81)
RDW: 13.5 % (ref 11.5–15.5)
WBC: 8 10*3/uL (ref 4.0–10.5)

## 2013-06-02 LAB — APTT: aPTT: 26 seconds (ref 24–37)

## 2013-06-02 MED ORDER — PHENYLEPHRINE HCL 0.5 % NA SOLN
2.0000 [drp] | Freq: Once | NASAL | Status: AC
  Administered 2013-06-02: 2 [drp] via NASAL
  Filled 2013-06-02: qty 15

## 2013-06-02 NOTE — ED Notes (Signed)
Pt aware awaiting dc papers. Nad.

## 2013-06-02 NOTE — ED Provider Notes (Signed)
CSN: [id number]     Arrival date & time 06/02/13  1000 History  This chart was scribed for Janice Norrie, MD,  by Stacy Gardner, ED Scribe. The patient was seen in room APA06/APA06 and the patient's care was started at 11:03 AM.     First MD Initiated Contact with Patient 06/02/13 1023     Chief Complaint  Patient presents with  . Epistaxis     (Consider location/radiation/quality/duration/timing/severity/associated sxs/prior Treatment) Patient is a 80 y.o. male presenting with nosebleeds. The history is provided by the patient, medical records and the spouse. No language interpreter was used.  Epistaxis Location:  L nare Severity:  Moderate Duration:  30 minutes Timing:  Constant Progression:  Resolved Chronicity:  Recurrent Context: not aspirin use   Associated symptoms: cough, dizziness and sneezing   Risk factors: no alcohol use and no head and neck tumor    HPI Comments: [name] is a 80 y.o. male who presents to the Emergency Department complaining of left nare epistaxis after blowing his nose, onset three hours ago. Pt reports the blood was profusely draining for thirty minutes and then expelled a clot which stopped the bleeding about 10 minutes later. Pt denies being on blood thinners, however he had an embolic stroke in November and has been on Plavix (generic). He currently denies feeling feels dizzy but has been weak but explains this is baseline since his stroke.  He states this is the second time having an episode of epistaxsis  this week.. Pt has a heat pump in his home. Pt has his usual chronic cough and mild sneezing for the past three days. Denies fever, rhinorrhea and sore throat. He has had nose bleeds many times in the past.  Pt had several nasal cauterizations in the past. Denies smoking tobacco.   PCP Dr Dennard Schaumann at [facility] Dr Benjamine Mola (ear surgery)   Past Medical History  Diagnosis Date  . Dyslipidemia   . Seizure disorder   . Hypothyroidism   . DDD  (degenerative disc disease)   . Hx MRSA infection     left buttocks abscess  . Rotator cuff injury     chronic rotator cuff injury status post repair  . Hypertension   . Syncope   . Dizziness   . Meniere's disease     Status post shunt  . BPH (benign prostatic hyperplasia)   . Diabetes mellitus without complication   . Hyperlipidemia   . Coronary artery disease     a. s/p MI tx with Cypher DES to pRCA in 4/04;  b. Echocardiogram 7/09: Normal LV function.  c. Nuclear study 3/13 no ischemia;  d. ETT 2/14 neg;  e. admx with CP => LHC (01/30/2013):  pLAD 40-50%, oD1 70-80%, oOM1 40%, pRCA stent patent, mid RCA 30%, EF 60-65%. => med Rx.  . Stroke     a. 01/2013=> post cardiac cath CVA to L post communicating artery system; R sided weakness  . Hx of echocardiogram     a. Echocardiogram (01/31/2013): Mild focal basal and mild concentric hypertrophy of the septum, EF 50-55%, normal wall motion, grade 1 diastolic dysfunction, trivial AI, MAC, mild LAE, PASP 35  . Occlusion and stenosis of carotid artery without mention of cerebral infarction     40-59% on carotid doppler 2014; [place] (01/2013): R 1-39%, L 60-79%   Past Surgical History  Procedure Laterality Date  . Coronary angioplasty with stent placement  07/14/2002    Stent to the right coronary  artery  . Rotator cuff repair      for chronic rotator cuff injury  . Hernia repair  04/10/2008    scrotal hernia repair  . Endolymphatic shunt decompression  06/11/2009     Right endolymphatic sac decompression and shunt  placement   Family History  Problem Relation Age of Onset  . Heart attack Father 37  . Aneurysm Mother     brain aneurysm  . Coronary artery disease Brother     with CABG   History  Substance Use Topics  . Smoking status: Former Smoker    Quit date: 08/19/1961  . Smokeless tobacco: Not on file  . Alcohol Use: No  lives at home Lives with spouse  Review of Systems  Constitutional: Positive for fatigue.  HENT:  Positive for nosebleeds and sneezing.   Respiratory: Positive for cough.   Neurological: Positive for dizziness and weakness.  Hematological: Does not bruise/bleed easily.  All other systems reviewed and are negative.      Allergies  Review of patient's allergies indicates no known allergies.  Home Medications   Current Outpatient Rx  Name  Route  Sig  Dispense  Refill  . carbamazepine (CARBATROL) 300 MG 12 hr capsule   Oral   Take 1 capsule (300 mg total) by mouth 2 (two) times daily.   60 capsule   1   . cloNIDine (CATAPRES) 0.1 MG tablet   Oral   Take 1 tablet (0.1 mg total) by mouth 2 (two) times daily.   60 tablet   11   . hydrochlorothiazide (HYDRODIURIL) 25 MG tablet   Oral   Take 1 tablet (25 mg total) by mouth daily.   30 tablet   5     Note dosage change - Please make pt aware - he wil ...   . isosorbide mononitrate (IMDUR) 30 MG 24 hr tablet   Oral   Take 1 tablet (30 mg total) by mouth daily.   30 tablet   3   . levothyroxine (SYNTHROID, LEVOTHROID) 25 MCG tablet      TAKE 2 TABLETS BY MOUTH DAILY BEFORE BREAKFAST   30 tablet   11   . losartan (COZAAR) 100 MG tablet   Oral   Take 0.5 tablets (50 mg total) by mouth 2 (two) times daily.   30 tablet   6     2015 formulary change   . metFORMIN (GLUCOPHAGE) 500 MG tablet   Oral   Take 1 tablet (500 mg total) by mouth 2 (two) times daily with a meal.   60 tablet   1   . metoprolol (LOPRESSOR) 50 MG tablet      TAKE 1 TABLET TWICE A DAY   60 tablet   11   . Multiple Vitamins-Minerals (MULTIVITAMINS THER. W/MINERALS) TABS   Oral   Take 1 tablet by mouth at bedtime.           . nitroGLYCERIN (NITROSTAT) 0.4 MG SL tablet   Sublingual   Place 1 tablet (0.4 mg total) under the tongue every 5 (five) minutes as needed for chest pain.   25 tablet   3   . pantoprazole (PROTONIX) 40 MG tablet   Oral   Take 1 tablet (40 mg total) by mouth daily.   30 tablet   3   . silodosin (RAPAFLO) 8  MG CAPS capsule   Oral   Take 8 mg by mouth daily with breakfast.  BP 121/71  Pulse 65  Temp(Src) 98.1 F (36.7 C) (Oral)  Resp 20  SpO2 96%  Vital signs normal   Physical Exam  Nursing note and vitals reviewed. Constitutional: He is oriented to person, place, and time. He appears well-developed and well-nourished.  Non-toxic appearance. He does not appear ill. No distress.  HENT:  Head: Normocephalic and atraumatic.  Right Ear: External ear normal.  Left Ear: External ear normal.  Nose: No mucosal edema or rhinorrhea.  Mouth/Throat: Oropharynx is clear and moist and mucous membranes are normal. No dental abscesses or uvula swelling.  Both nostrils were examined. There was no excoriations or obvious bleeding sites seen on the anterior septum bilaterally. His turbinates are very prominent with redness and swelling. There is a stripe of redness seen on the medial turbinate medially. There is no active bleeding at this time      Eyes: Conjunctivae and EOM are normal. Pupils are equal, round, and reactive to light.  Neck: Normal range of motion and full passive range of motion without pain. Neck supple.  Pulmonary/Chest: Effort normal. He has no rhonchi. He exhibits no crepitus.  Abdominal: Normal appearance. There is no rebound and no guarding.  Musculoskeletal: Normal range of motion.  Moves all extremities well.   Neurological: He is alert and oriented to person, place, and time. He has normal strength. No cranial nerve deficit.  Skin: Skin is warm, dry and intact. No rash noted. No erythema. No pallor.  Psychiatric: He has a normal mood and affect. His speech is normal and behavior is normal. His mood appears not anxious.    ED Course  Procedures (including critical care time)  Medications  phenylephrine (NEO-SYNEPHRINE) 0.5 % nasal solution 2 drop (2 drops Each Nare Given 06/02/13 1145)     Review of patient's prior chart shows he was admitted in November for  an embolic stroke and was started on Plavix. He has also been seen by his PCP in December and in January and on his medication list he was on Plavix and in January he had 5 refills.  10:46 AM Discussed course of care with pt . Pt understands and agrees.  Pt had afrin sprayed into his nose. He has had no further bleeding while in the ED.   DIAGNOSTIC STUDIES: Oxygen Saturation is 96% on room air, normal by my interpretation.    COORDINATION OF CARE:      Labs Review Results for orders placed during the hospital encounter of 06/02/13  CBC WITH DIFFERENTIAL      Result Value Ref Range   WBC 8.0  4.0 - 10.5 K/uL   RBC 4.02 (*) 4.22 - 5.81 MIL/uL   Hemoglobin 12.4 (*) 13.0 - 17.0 g/dL   HCT 36.5 (*) 39.0 - 52.0 %   MCV 90.8  78.0 - 100.0 fL   MCH 30.8  26.0 - 34.0 pg   MCHC 34.0  30.0 - 36.0 g/dL   RDW 13.5  11.5 - 15.5 %   Platelets 231  150 - 400 K/uL   Neutrophils Relative % 63  43 - 77 %   Neutro Abs 5.0  1.7 - 7.7 K/uL   Lymphocytes Relative 24  12 - 46 %   Lymphs Abs 1.9  0.7 - 4.0 K/uL   Monocytes Relative 7  3 - 12 %   Monocytes Absolute 0.6  0.1 - 1.0 K/uL   Eosinophils Relative 5  0 - 5 %   Eosinophils Absolute 0.4  0.0 -  0.7 K/uL   Basophils Relative 0  0 - 1 %   Basophils Absolute 0.0  0.0 - 0.1 K/uL  APTT      Result Value Ref Range   aPTT 26  24 - 37 seconds  PROTIME-INR      Result Value Ref Range   Prothrombin Time 12.4  11.6 - 15.2 seconds   INR 0.94  0.00 - 1.49   Laboratory interpretation all normal except mild stable anemia    Imaging Review No results found.   EKG Interpretation None      MDM   patient has history of frequent nosebleeds requiring cauterization in the past presents with nose bleeds twice in the past week. There was no obvious bleeding point on the anterior nasal septum. There was an area on one of his turbinates it was suspicious for bleeding. Patient has seen Dr. Benjamine Mola, ENT for  prior ear surgery in the past. He's advised to  followup with him. Patient is on Plavix for recent embolic stroke. It must be nosebleeds become more of an issue he should remain on the Plavix. However patient also states he is supposed to have eye surgery this week, he was unaware he was on the Plavix. He is advised to contact his eye surgeon to let them he is on the Plavix.    Final diagnoses:  Left-sided epistaxis    Plan discharge   Daniel Porter, MD, FACEP    I personally performed the services described in this documentation, which was scribed in my presence. The recorded information has been reviewed and considered.  Daniel Porter, MD, [facility]   Janice Norrie, MD 06/02/13 [phone]

## 2013-06-02 NOTE — ED Notes (Signed)
neosynephrine given to take home with instructions. Pt verbalized understanding

## 2013-06-02 NOTE — Discharge Instructions (Signed)
Let your eye doctor know that you are on the plavix (clopidogrel), call his office Monday.  Use the afrin nasal spray on a cotton ball and place it in you nostril if the bleeding recurs and hold pressure on your nose like an old fashioned clothes pin as we discussed for 5 minutes to stop the bleeding if it recurs. Use vaseline on the cartilage between your nostrils before you go to bed at night. Use a vaporizer of put a pot of water on the heat register about 30 minutes before bedtime so your bedroom will have moist air (dryness in the nose will make you have more bleeding).  If you continue to have bleeding, have Dr Benjamine Mola, recheck you this week.     Nosebleed Nosebleeds can be caused by many conditions including trauma, infections, polyps, foreign bodies, dry mucous membranes or climate, medications and air conditioning. Most nosebleeds occur in the front of the nose. It is because of this location that most nosebleeds can be controlled by pinching the nostrils gently and continuously. Do this for at least 10 to 20 minutes. The reason for this long continuous pressure is that you must hold it long enough for the blood to clot. If during that 10 to 20 minute time period, pressure is released, the process may have to be started again. The nosebleed may stop by itself, quit with pressure, need concentrated heating (cautery) or stop with pressure from packing. HOME CARE INSTRUCTIONS   If your nose was packed, try to maintain the pack inside until your caregiver removes it. If a gauze pack was used and it starts to fall out, gently replace or cut the end off. Do not cut if a balloon catheter was used to pack the nose. Otherwise, do not remove unless instructed.  Avoid blowing your nose for 12 hours after treatment. This could dislodge the pack or clot and start bleeding again.  If the bleeding starts again, sit up and bending forward, gently pinch the front half of your nose continuously for 20 minutes.  If  bleeding was caused by dry mucous membranes, cover the inside of your nose every morning with a petroleum or antibiotic ointment. Use your little fingertip as an applicator. Do this as needed during dry weather. This will keep the mucous membranes moist and allow them to heal.  Maintain humidity in your home by using less air conditioning or using a humidifier.  Do not use aspirin or medications which make bleeding more likely. Your caregiver can give you recommendations on this.  Resume normal activities as able but try to avoid straining, lifting or bending at the waist for several days.  If the nosebleeds become recurrent and the cause is unknown, your caregiver may suggest laboratory tests. SEEK IMMEDIATE MEDICAL CARE IF:   Bleeding recurs and cannot be controlled.  There is unusual bleeding from or bruising on other parts of the body.  You have a fever.  Nosebleeds continue.  There is any worsening of the condition which originally brought you in.  You become lightheaded, feel faint, become sweaty or vomit blood. MAKE SURE YOU:   Understand these instructions.  Will watch your condition.  Will get help right away if you are not doing well or get worse. Document Released: 12/16/2004 Document Revised: 05/31/2011 Document Reviewed: 02/07/2009 [employer] Patient Information 2014 [place], [place].

## 2013-06-02 NOTE — ED Notes (Signed)
Pt states bleeding from right nares began at 0800 and lasted ~ 30 min. States "blood was pouring". Also states this is the second time this has occurred within a weak. Denies being on blood thinners.

## 2013-06-04 ENCOUNTER — Ambulatory Visit (INDEPENDENT_AMBULATORY_CARE_PROVIDER_SITE_OTHER): Payer: Medicare Other | Admitting: Cardiology

## 2013-06-04 ENCOUNTER — Encounter: Payer: Self-pay | Admitting: Cardiology

## 2013-06-04 VITALS — BP 142/84 | HR 78 | Wt 227.0 lb

## 2013-06-04 DIAGNOSIS — H2510 Age-related nuclear cataract, unspecified eye: Secondary | ICD-10-CM | POA: Diagnosis not present

## 2013-06-04 DIAGNOSIS — I2510 Atherosclerotic heart disease of native coronary artery without angina pectoris: Secondary | ICD-10-CM | POA: Diagnosis not present

## 2013-06-04 NOTE — Progress Notes (Signed)
HPI  The patient presents for followup of coronary disease. After his catheterization in November he had a CVA. This was embolic following the procedure. He has had a significant recovery from this but still has some visual deficits on his right side and right-sided arm and leg weakness. He's using a cane. He had to switch to doing things with his left hand predominantly. He's not having any of the chest discomfort that prompted his catheterization. With his physical therapy and his levels of activity he denies any ongoing chest pressure, neck or arm discomfort. He's had no new shortness of breath, PND or orthopnea. He doesn't notice any palpitations, presyncope or syncope. He's had no weight gain or edema.   No Known Allergies  Current Outpatient Prescriptions  Medication Sig Dispense Refill  . carbamazepine (CARBATROL) 300 MG 12 hr capsule Take 1 capsule (300 mg total) by mouth 2 (two) times daily.  60 capsule  1  . cloNIDine (CATAPRES) 0.1 MG tablet Take 1 tablet (0.1 mg total) by mouth 2 (two) times daily.  60 tablet  11  . clopidogrel (PLAVIX) 75 MG tablet Take 75 mg by mouth daily with breakfast.      . hydrochlorothiazide (HYDRODIURIL) 25 MG tablet Take 1 tablet (25 mg total) by mouth daily.  30 tablet  5  . isosorbide mononitrate (IMDUR) 30 MG 24 hr tablet Take 1 tablet (30 mg total) by mouth daily.  30 tablet  3  . levothyroxine (SYNTHROID, LEVOTHROID) 25 MCG tablet TAKE 2 TABLETS BY MOUTH DAILY BEFORE BREAKFAST  30 tablet  11  . losartan (COZAAR) 100 MG tablet Take 0.5 tablets (50 mg total) by mouth 2 (two) times daily.  30 tablet  6  . metFORMIN (GLUCOPHAGE) 500 MG tablet Take 1 tablet (500 mg total) by mouth 2 (two) times daily with a meal.  60 tablet  1  . metoprolol (LOPRESSOR) 50 MG tablet TAKE 1 TABLET TWICE A DAY  60 tablet  11  . Multiple Vitamins-Minerals (MULTIVITAMINS THER. W/MINERALS) TABS Take 1 tablet by mouth at bedtime.        . nitroGLYCERIN (NITROSTAT) 0.4 MG SL  tablet Place 1 tablet (0.4 mg total) under the tongue every 5 (five) minutes as needed for chest pain.  25 tablet  3  . silodosin (RAPAFLO) 8 MG CAPS capsule Take 8 mg by mouth daily with breakfast.       . pantoprazole (PROTONIX) 40 MG tablet Take 1 tablet (40 mg total) by mouth daily.  30 tablet  3   No current facility-administered medications for this visit.    Past Medical History  Diagnosis Date  . Dyslipidemia   . Seizure disorder   . Hypothyroidism   . DDD (degenerative disc disease)   . Hx MRSA infection     left buttocks abscess  . Rotator cuff injury     chronic rotator cuff injury status post repair  . Hypertension   . Syncope   . Dizziness   . Meniere's disease     Status post shunt  . BPH (benign prostatic hyperplasia)   . Diabetes mellitus without complication   . Hyperlipidemia   . Coronary artery disease     a. s/p MI tx with Cypher DES to pRCA in 4/04;  b. Echocardiogram 7/09: Normal LV function.  c. Nuclear study 3/13 no ischemia;  d. ETT 2/14 neg;  e. admx with CP => LHC (01/30/2013):  pLAD 40-50%, oD1 70-80%, oOM1 40%, pRCA stent patent,  mid RCA 30%, EF 60-65%. => med Rx.  . Stroke     a. 01/2013=> post cardiac cath CVA to L post communicating artery system; R sided weakness  . Hx of echocardiogram     a. Echocardiogram (01/31/2013): Mild focal basal and mild concentric hypertrophy of the septum, EF 50-55%, normal wall motion, grade 1 diastolic dysfunction, trivial AI, MAC, mild LAE, PASP 35  . Occlusion and stenosis of carotid artery without mention of cerebral infarction     40-59% on carotid doppler 2014; [place] (01/2013): R 1-39%, L 60-79%    Past Surgical History  Procedure Laterality Date  . Coronary angioplasty with stent placement  07/14/2002    Stent to the right coronary artery  . Rotator cuff repair      for chronic rotator cuff injury  . Hernia repair  04/10/2008    scrotal hernia repair  . Endolymphatic shunt decompression  06/11/2009      Right endolymphatic sac decompression and shunt  placement    ROS:  As stated in the HPI and negative for all other systems.  PHYSICAL EXAM BP 142/84  Pulse 78  Wt 227 lb (102.967 kg) GENERAL:  Well appearing NECK:  No jugular venous distention, waveform within normal limits, carotid upstroke brisk and symmetric, left bruit, no thyromegaly LYMPHATICS:  No cervical, inguinal adenopathy LUNGS:  Clear to auscultation bilaterally BACK:  No CVA tenderness CHEST:  Unremarkable HEART:  PMI not displaced or sustained,S1 and S2 within normal limits, no S3, no S4, no clicks, no rubs, no murmurs ABD:  Flat, positive bowel sounds normal in frequency in pitch, no bruits, no rebound, no guarding, no midline pulsatile mass, no hepatomegaly, no splenomegaly EXT:  2 plus pulses throughout, no edema, no cyanosis no clubbing SKIN:  No rashes no nodules NEURO:  Mild right sided weakness. PSYCH:  Cognitively intact, oriented to person place and time  EKG - sinus rhythm, rate 75, left axis deviation, left ventricle hypertrophy by voltage criteria, nonspecific ST T wave changes.  06/04/2013  ASSESSMENT AND PLAN  Coronary artery disease -  The patient has no new sypmtoms.  No further cardiovascular testing is indicated.  We will continue with aggressive risk reduction and meds as listed.  Hypertension -  His BP is slightly hight today.  However, this has not been the usual.  He will continue on the meds as listed.    CVA-  He will continue on the meds as listed including Plavix.    Dyslipidemia -  He had an excellent lipid profile in the hospital in November. No change in therapy is indicated.  Carotid stenosis - He will have a follow up Doppler in Nov.  I reviewed the hospital Doppler and he had about 60% left stenosis.

## 2013-06-04 NOTE — Patient Instructions (Signed)
The current medical regimen is effective;  continue present plan and medications.  Your physician has requested that you have a carotid duplex due in November 2015. This test is an ultrasound of the carotid arteries in your neck. It looks at blood flow through these arteries that supply the brain with blood. Allow one hour for this exam. There are no restrictions or special instructions.  Follow up in 6 months with Dr Percival Spanish.  You will receive a letter in the mail 2 months before you are due.  Please call us when you receive this letter to schedule your follow up appointment.

## 2013-06-06 ENCOUNTER — Other Ambulatory Visit: Payer: Self-pay | Admitting: Family Medicine

## 2013-06-06 DIAGNOSIS — H2510 Age-related nuclear cataract, unspecified eye: Secondary | ICD-10-CM | POA: Diagnosis not present

## 2013-06-06 DIAGNOSIS — H2589 Other age-related cataract: Secondary | ICD-10-CM | POA: Diagnosis not present

## 2013-06-06 DIAGNOSIS — H269 Unspecified cataract: Secondary | ICD-10-CM | POA: Diagnosis not present

## 2013-06-06 MED ORDER — ATORVASTATIN CALCIUM 80 MG PO TABS: 80.0000 mg | ORAL_TABLET | Freq: Every day | ORAL | Status: DC

## 2013-06-06 NOTE — Telephone Encounter (Signed)
Rx Refilled  

## 2013-06-13 ENCOUNTER — Other Ambulatory Visit: Payer: Self-pay | Admitting: Family Medicine

## 2013-06-13 ENCOUNTER — Other Ambulatory Visit (HOSPITAL_COMMUNITY): Payer: Self-pay | Admitting: Physician Assistant

## 2013-06-13 DIAGNOSIS — H2510 Age-related nuclear cataract, unspecified eye: Secondary | ICD-10-CM | POA: Diagnosis not present

## 2013-06-13 NOTE — Telephone Encounter (Signed)
Medication refilled per protocol. 

## 2013-06-18 DIAGNOSIS — H6120 Impacted cerumen, unspecified ear: Secondary | ICD-10-CM | POA: Diagnosis not present

## 2013-06-18 DIAGNOSIS — R040 Epistaxis: Secondary | ICD-10-CM | POA: Diagnosis not present

## 2013-06-19 ENCOUNTER — Telehealth: Payer: Self-pay | Admitting: Family Medicine

## 2013-06-19 MED ORDER — CARBAMAZEPINE ER 300 MG PO CP12: 300.0000 mg | ORAL_CAPSULE | Freq: Two times a day (BID) | ORAL | Status: DC

## 2013-06-19 NOTE — Telephone Encounter (Signed)
Ok to refill tegretol xr 300 bid #[phone].

## 2013-06-19 NOTE — Telephone Encounter (Signed)
Message copied by Alyson Locket on Tue Jun 19, 2013 10:16 AM ------      Message from: [facility], Charlynne Cousins      Created: Mon Jun 18, 2013  2:44 PM      Contact: [phone]       Needs new Rx for Carbamazephine ER 300 mgs  #1 tid      Was given this Rx while in the hospital ------

## 2013-06-19 NOTE — Telephone Encounter (Signed)
Rx Refilled  

## 2013-06-19 NOTE — Telephone Encounter (Signed)
?   OK to Refill  

## 2013-06-20 DIAGNOSIS — H2589 Other age-related cataract: Secondary | ICD-10-CM | POA: Diagnosis not present

## 2013-06-20 DIAGNOSIS — H269 Unspecified cataract: Secondary | ICD-10-CM | POA: Diagnosis not present

## 2013-06-20 DIAGNOSIS — H2510 Age-related nuclear cataract, unspecified eye: Secondary | ICD-10-CM | POA: Diagnosis not present

## 2013-06-25 ENCOUNTER — Other Ambulatory Visit: Payer: Self-pay | Admitting: Family Medicine

## 2013-06-26 ENCOUNTER — Other Ambulatory Visit: Payer: Self-pay | Admitting: *Deleted

## 2013-06-26 NOTE — Telephone Encounter (Signed)
Notes indicate this was discontinued on 06/02/13.  "patient hasn't used in over 30 days" by ED.  I called pharmacy and they stated patient has been getting his monthly refills.  OK refill?

## 2013-06-26 NOTE — Telephone Encounter (Signed)
rx faxed to pharmacy

## 2013-07-05 ENCOUNTER — Other Ambulatory Visit: Payer: Self-pay | Admitting: Family Medicine

## 2013-07-16 ENCOUNTER — Other Ambulatory Visit: Payer: Self-pay | Admitting: Family Medicine

## 2013-07-17 ENCOUNTER — Other Ambulatory Visit: Payer: Self-pay | Admitting: Family Medicine

## 2013-07-24 ENCOUNTER — Ambulatory Visit: Payer: Medicare Other | Admitting: Nurse Practitioner

## 2013-07-26 ENCOUNTER — Other Ambulatory Visit (HOSPITAL_COMMUNITY): Payer: Self-pay | Admitting: Physician Assistant

## 2013-07-30 ENCOUNTER — Ambulatory Visit: Payer: Medicare Other | Admitting: Nurse Practitioner

## 2013-08-06 ENCOUNTER — Telehealth: Payer: Self-pay | Admitting: Neurology

## 2013-08-06 NOTE — Telephone Encounter (Signed)
Pt called is needing to get is license back and has to see the Dr. To do this. Pt needs an apt sooner than past this summer, please call pt to schedule in sooner. Thanks

## 2013-08-07 NOTE — Telephone Encounter (Signed)
Patient has been scheduled for 08/09/13,and he is aware

## 2013-08-09 ENCOUNTER — Encounter: Payer: Self-pay | Admitting: Nurse Practitioner

## 2013-08-09 ENCOUNTER — Ambulatory Visit (INDEPENDENT_AMBULATORY_CARE_PROVIDER_SITE_OTHER): Payer: Medicare Other | Admitting: Nurse Practitioner

## 2013-08-09 ENCOUNTER — Encounter (INDEPENDENT_AMBULATORY_CARE_PROVIDER_SITE_OTHER): Payer: Self-pay

## 2013-08-09 VITALS — BP 149/86 | HR 66 | Ht 65.0 in | Wt 227.0 lb

## 2013-08-09 DIAGNOSIS — I6340 Cerebral infarction due to embolism of unspecified cerebral artery: Secondary | ICD-10-CM

## 2013-08-09 DIAGNOSIS — R197 Diarrhea, unspecified: Secondary | ICD-10-CM | POA: Diagnosis not present

## 2013-08-09 NOTE — Patient Instructions (Addendum)
Continue clopidogrel 75 mg orally every day for secondary stroke prevention and maintain strict control of hypertension with blood pressure goal below 140/90, diabetes with hemoglobin A1c goal below 7% and lipids with LDL cholesterol goal below 70 mg/dL  May return to driving, please be careful and try to avoid interstates and nighttime driving if possible. Followup in 6 months, sooner as needed.

## 2013-08-09 NOTE — Progress Notes (Signed)
PATIENT: [name] DOB: [birth year]  REASON FOR VISIT: routine stroke follow up HISTORY FROM: patient  HISTORY OF PRESENT ILLNESS: [name] is an 80 y.o. male with a past medical history significant for dyslipidemia, HTN, DM, CAD s/p MI, syncope, hypothyroidism, TGA couple of months ago, Meniere's disease, who underwent right radial cardiac cath on 01/30/2013 and in the afternoon was supposed to be discharged when he reported having a numb sensation around his lips and right face and then was noted to be off balance and leaning to the right. Family stated that his speech was " slurred but we thought it was from sedatives". He denies associated HA, vertigo, double vision, difficulty swallowing, focal weakness, confusion, or visual disturbance. Evaluated by the rapid response nurse and NIHSS 1 reported. His right face/lips still " little bit numb". Patient was not a TPA candidate secondary to NIHSS =1 with only numbness (too mild to treat). He was admitted for further evaluation and treatment. Symptoms worsened somewhat during the first 24 hours. Imaging confirmed bilateral anterior and posterior infarcts, the largest being in the left thalamus. [name] confirms findings with left PCA occlusion 1.5 cm beyond its origin and right PCA severe stenosis 1 cm beyond its origin. He was found to have 50% stenosis in the left internal carotid artery, lowest range, on doppler, a referral was made by PCP to vascular surgery. He is still attending outpatient PT/and OT. He is tolerating Plavix well with significant bruising.  UPDATE 08/09/13 (LL): Since last visit patient has made good improvement. He is pleased with his progress.  He feels well except for having diarrhea nonstop for the past 6 months.  He is not spoken to his primary care physician about this problem.  He states this is not his normal and has only occurred since he was in the hospital. He says he must go several times a day and is  incontinent at times, diarrhea has a foul smell.  Patient states his blood pressure is well controlled, it is 149/86 in office today. He is tolerating Plavix well with no signs of significant bleeding or bruising.   REVIEW OF SYSTEMS: Full 14 system review of systems performed and notable only for:  Fatigue, ringing in years, diarrhea, frequency of urination, joint pain, aching muscles, incontinence of bowels, moles, bruise easily, numbness  ALLERGIES: No Known Allergies  HOME MEDICATIONS: Outpatient Prescriptions Prior to Visit  Medication Sig Dispense Refill  . atorvastatin (LIPITOR) 80 MG tablet Take 1 tablet (80 mg total) by mouth daily.  90 tablet  3  . carbamazepine (CARBATROL) 300 MG 12 hr capsule Take 1 capsule (300 mg total) by mouth 2 (two) times daily.  60 capsule  5  . cloNIDine (CATAPRES) 0.1 MG tablet Take 1 tablet (0.1 mg total) by mouth 2 (two) times daily.  60 tablet  11  . cloNIDine (CATAPRES) 0.1 MG tablet TAKE 1 TABLET BY MOUTH TWICE A DAY  60 tablet  5  . cloNIDine (CATAPRES) 0.1 MG tablet TAKE 1 TABLET BY MOUTH TWICE A DAY  60 tablet  11  . clopidogrel (PLAVIX) 75 MG tablet Take 75 mg by mouth daily with breakfast.      . diazepam (VALIUM) 2 MG tablet TAKE 1 TABLET TWICE A DAY  60 tablet  2  . hydrochlorothiazide (HYDRODIURIL) 25 MG tablet Take 1 tablet (25 mg total) by mouth daily.  30 tablet  5  . isosorbide mononitrate (IMDUR) 30 MG 24 hr tablet TAKE  1 TABLET BY MOUTH EVERY DAY  30 tablet  3  . levothyroxine (SYNTHROID, LEVOTHROID) 25 MCG tablet TAKE 2 TABLETS BY MOUTH DAILY BEFORE BREAKFAST  30 tablet  11  . losartan (COZAAR) 100 MG tablet Take 0.5 tablets (50 mg total) by mouth 2 (two) times daily.  30 tablet  6  . metFORMIN (GLUCOPHAGE) 500 MG tablet TAKE 1 TABLET BY MOUTH 2 TIMES DAILY WITH A MEAL.  60 tablet  5  . metoprolol (LOPRESSOR) 50 MG tablet TAKE 1 TABLET TWICE A DAY  60 tablet  11  . Multiple Vitamins-Minerals (MULTIVITAMINS THER. W/MINERALS) TABS Take 1  tablet by mouth at bedtime.        . nitroGLYCERIN (NITROSTAT) 0.4 MG SL tablet Place 1 tablet (0.4 mg total) under the tongue every 5 (five) minutes as needed for chest pain.  25 tablet  3  . pantoprazole (PROTONIX) 40 MG tablet TAKE 1 TABLET BY MOUTH EVERY DAY  30 tablet  3  . silodosin (RAPAFLO) 8 MG CAPS capsule Take 8 mg by mouth daily with breakfast.       . diazepam (VALIUM) 2 MG tablet       . metFORMIN (GLUCOPHAGE) 500 MG tablet Take 1 tablet (500 mg total) by mouth 2 (two) times daily with a meal.  60 tablet  1   No facility-administered medications prior to visit.     PHYSICAL EXAM  Filed Vitals:   08/09/13 1441  BP: 149/86  Pulse: 66  Height: 5\' 5"  (1.651 m)  Weight: 227 lb (102.967 kg)   Body mass index is 37.77 kg/(m^2). No exam data present   Generalized: Well developed, in no acute distress  Head: normocephalic and atraumatic. Oropharynx benign  Neck: Supple, no carotid bruits  Cardiac: Regular rate rhythm, no murmur  Musculoskeletal: No deformity   Neurological examination  Mentation: Alert oriented to time, place, history taking. Follows all commands speech and language fluent  Cranial nerve II-XII: Pupils were equal round reactive to light extraocular movements were full, Partial right homonymous hemianopsia to bedside confrontational testing. Mild right facial weakness. Decreased sensation around the right mouth and tongue, hearing was intact to finger rubbing bilaterally. Uvula tongue midline. head turning and shoulder shrug and were normal and symmetric.Tongue protrusion into cheek strength was normal.  Motor: normal bulk and tone, fine finger movements decreased on the right, no pronator drift. RUE 4+/5. RLE 4+/5.  Sensory: normal and symmetric to light touch  Coordination: Right upper limb ataxia remains. Heel-shin normal bilaterally.  Reflexes: Deep tendon reflexes in the upper and lower extremities are present and symmetric.  Gait and Station: Rising up  from seated position without assistance, wide based gait, able to perform tiptoe without difficulty. Tandem unsteady. Romberg negative.  ASSESSMENT  [name] is a 80 y.o. male presenting with acute onset perioral/right face numbness and unsteadiness after cardiac cath on 01/30/13. Imaging confirmed bilateral anterior and posterior infarcts, the largest being in the left thalamus. [name] confirms findings with left PCA occlusion 1.5 cm beyond its origin and right PCA severe stenosis 1 cm beyond its origin. This helps to confirm infarcts felt to be embolic secondary to post cath complication.  Patient with resultant right sided numbness (hemisensory deficit), and mild right weakness.  PLAN:  Continue clopidogrel 75 mg orally every day for secondary stroke prevention and maintain strict control of hypertension with blood pressure goal below 140/90, diabetes with hemoglobin A1c goal below 7% and lipids with LDL cholesterol goal  below 70 mg/dL.  Graduated return to driving instructions were given the patient today. He has already been driving short distances. Followup in 6 months, sooner as needed.  Philmore Pali, MSN, NP-C 08/09/2013, 5:27 PM [facility] [address] [place], [place] [address] [phone]  Note: This document was prepared with digital dictation and possible smart phrase technology. Any transcriptional errors that result from this process are unintentional.

## 2013-08-22 ENCOUNTER — Other Ambulatory Visit: Payer: Self-pay | Admitting: Family Medicine

## 2013-08-30 ENCOUNTER — Encounter: Payer: Self-pay | Admitting: Neurology

## 2013-09-28 ENCOUNTER — Other Ambulatory Visit: Payer: Self-pay | Admitting: Family Medicine

## 2013-09-29 NOTE — Telephone Encounter (Signed)
?   OK to Refill  

## 2013-09-30 ENCOUNTER — Other Ambulatory Visit: Payer: Self-pay | Admitting: Family Medicine

## 2013-10-01 NOTE — Telephone Encounter (Signed)
ok 

## 2013-10-02 ENCOUNTER — Other Ambulatory Visit: Payer: Self-pay | Admitting: Family Medicine

## 2013-10-22 ENCOUNTER — Encounter: Payer: Self-pay | Admitting: Family Medicine

## 2013-10-22 ENCOUNTER — Ambulatory Visit (INDEPENDENT_AMBULATORY_CARE_PROVIDER_SITE_OTHER): Payer: Medicare Other | Admitting: Family Medicine

## 2013-10-22 VITALS — BP 80/52 | HR 80 | Temp 97.5°F | Resp 18 | Ht 65.0 in | Wt 217.0 lb

## 2013-10-22 DIAGNOSIS — R5381 Other malaise: Secondary | ICD-10-CM

## 2013-10-22 DIAGNOSIS — I6340 Cerebral infarction due to embolism of unspecified cerebral artery: Secondary | ICD-10-CM

## 2013-10-22 DIAGNOSIS — R5383 Other fatigue: Secondary | ICD-10-CM | POA: Diagnosis not present

## 2013-10-22 DIAGNOSIS — E860 Dehydration: Secondary | ICD-10-CM | POA: Diagnosis not present

## 2013-10-22 LAB — CBC WITH DIFFERENTIAL/PLATELET
Basophils Absolute: 0.1 10*3/uL (ref 0.0–0.1)
Basophils Relative: 1 % (ref 0–1)
Eosinophils Absolute: 0.4 10*3/uL (ref 0.0–0.7)
Eosinophils Relative: 4 % (ref 0–5)
HCT: 38 % — ABNORMAL LOW (ref 39.0–52.0)
Hemoglobin: 12.9 g/dL — ABNORMAL LOW (ref 13.0–17.0)
Lymphocytes Relative: 24 % (ref 12–46)
Lymphs Abs: 2.2 10*3/uL (ref 0.7–4.0)
MCH: 29.9 pg (ref 26.0–34.0)
MCHC: 33.9 g/dL (ref 30.0–36.0)
MCV: 88 fL (ref 78.0–100.0)
Monocytes Absolute: 0.8 10*3/uL (ref 0.1–1.0)
Monocytes Relative: 9 % (ref 3–12)
Neutro Abs: 5.6 10*3/uL (ref 1.7–7.7)
Neutrophils Relative %: 62 % (ref 43–77)
Platelets: 325 10*3/uL (ref 150–400)
RBC: 4.32 MIL/uL (ref 4.22–5.81)
RDW: 13.5 % (ref 11.5–15.5)
WBC: 9.1 10*3/uL (ref 4.0–10.5)

## 2013-10-22 NOTE — Progress Notes (Signed)
Subjective:    Patient ID: [name], male    DOB: Mar 08, 1934, 80 y.o.   MRN: [id number]  HPI Patient has had very little appetite for one week. He reports some nausea. He denies any vomiting or diarrhea. He denies any fever. He denies any coughing. He denies any melena or hematochezia. He denies any bleeding. He denies any dysuria. He denies any polyuria. He does report dry mouth and dizziness. He also complains of profound fatigue. He denies any chest pain shortness of breath or dyspnea on exertion. Today but his blood pressure is extremely low at 80/52. I rechecked his blood pressure in down 90/52. He denies any syncope or presyncope although he does have dizziness upon standing. He denies any diarrhea. His daughter recently had viral gastrointestinal tract infection. Past Medical History  Diagnosis Date  . Dyslipidemia   . Seizure disorder   . Hypothyroidism   . DDD (degenerative disc disease)   . Hx MRSA infection     left buttocks abscess  . Rotator cuff injury     chronic rotator cuff injury status post repair  . Hypertension   . Syncope   . Dizziness   . Meniere's disease     Status post shunt  . BPH (benign prostatic hyperplasia)   . Diabetes mellitus without complication   . Hyperlipidemia   . Coronary artery disease     a. s/p MI tx with Cypher DES to pRCA in 4/04;  b. Echocardiogram 7/09: Normal LV function.  c. Nuclear study 3/13 no ischemia;  d. ETT 2/14 neg;  e. admx with CP => LHC (01/30/2013):  pLAD 40-50%, oD1 70-80%, oOM1 40%, pRCA stent patent, mid RCA 30%, EF 60-65%. => med Rx.  . Stroke     a. 01/2013=> post cardiac cath CVA to L post communicating artery system; R sided weakness  . Hx of echocardiogram     a. Echocardiogram (01/31/2013): Mild focal basal and mild concentric hypertrophy of the septum, EF 50-55%, normal wall motion, grade 1 diastolic dysfunction, trivial AI, MAC, mild LAE, PASP 35  . Occlusion and stenosis of carotid artery without mention  of cerebral infarction     40-59% on carotid doppler 2014; [place] (01/2013): R 1-39%, L 60-79%   Past Surgical History  Procedure Laterality Date  . Coronary angioplasty with stent placement  07/14/2002    Stent to the right coronary artery  . Rotator cuff repair      for chronic rotator cuff injury  . Hernia repair  04/10/2008    scrotal hernia repair  . Endolymphatic shunt decompression  06/11/2009     Right endolymphatic sac decompression and shunt  placement   Current Outpatient Prescriptions on File Prior to Visit  Medication Sig Dispense Refill  . atorvastatin (LIPITOR) 80 MG tablet Take 1 tablet (80 mg total) by mouth daily.  90 tablet  3  . carbamazepine (CARBATROL) 300 MG 12 hr capsule Take 1 capsule (300 mg total) by mouth 2 (two) times daily.  60 capsule  5  . cloNIDine (CATAPRES) 0.1 MG tablet Take 1 tablet (0.1 mg total) by mouth 2 (two) times daily.  60 tablet  11  . cloNIDine (CATAPRES) 0.1 MG tablet TAKE 1 TABLET BY MOUTH TWICE A DAY  60 tablet  11  . clopidogrel (PLAVIX) 75 MG tablet Take 75 mg by mouth daily with breakfast.      . diazepam (VALIUM) 2 MG tablet TAKE 1 TABLET BY MOUTH TWICE DAILY.  [place]  tablet  2  . hydrochlorothiazide (HYDRODIURIL) 25 MG tablet Take 1 tablet (25 mg total) by mouth daily.  30 tablet  5  . hydrochlorothiazide (HYDRODIURIL) 25 MG tablet TAKE 1 TABLET (25 MG TOTAL) BY MOUTH DAILY.  30 tablet  11  . hydrochlorothiazide (HYDRODIURIL) 25 MG tablet TAKE 1 TABLET (25 MG TOTAL) BY MOUTH DAILY.  30 tablet  11  . isosorbide mononitrate (IMDUR) 30 MG 24 hr tablet TAKE 1 TABLET BY MOUTH EVERY DAY  30 tablet  5  . levothyroxine (SYNTHROID, LEVOTHROID) 25 MCG tablet TAKE 2 TABLETS BY MOUTH DAILY BEFORE BREAKFAST  30 tablet  11  . losartan (COZAAR) 100 MG tablet Take 0.5 tablets (50 mg total) by mouth 2 (two) times daily.  30 tablet  6  . metFORMIN (GLUCOPHAGE) 500 MG tablet TAKE 1 TABLET BY MOUTH 2 TIMES DAILY WITH A MEAL.  60 tablet  5  . metoprolol  (LOPRESSOR) 50 MG tablet TAKE 1 TABLET TWICE A DAY  60 tablet  11  . Multiple Vitamins-Minerals (MULTIVITAMINS THER. W/MINERALS) TABS Take 1 tablet by mouth at bedtime.        . nitroGLYCERIN (NITROSTAT) 0.4 MG SL tablet Place 1 tablet (0.4 mg total) under the tongue every 5 (five) minutes as needed for chest pain.  25 tablet  3  . pantoprazole (PROTONIX) 40 MG tablet TAKE 1 TABLET BY MOUTH EVERY DAY  30 tablet  11  . silodosin (RAPAFLO) 8 MG CAPS capsule Take 8 mg by mouth daily with breakfast.        No current facility-administered medications on file prior to visit.   No Known Allergies History   Social History  . Marital Status: Married    Spouse Name: N/A    Number of Children: 4  . Years of Education: 12th   Occupational History  . Retired    Social History Main Topics  . Smoking status: Former Smoker    Quit date: 08/19/1961  . Smokeless tobacco: Not on file  . Alcohol Use: No  . Drug Use: No  . Sexual Activity: No   Other Topics Concern  . Not on file   Social History Narrative   Lives with wife.      Review of Systems  All other systems reviewed and are negative.      Objective:   Physical Exam  Vitals reviewed. Constitutional: He is oriented to person, place, and time.  HENT:  Nose: Nose normal.  Mouth/Throat: Mucous membranes are dry. No oropharyngeal exudate.  Eyes: Conjunctivae are normal. No scleral icterus.  Neck: Neck supple. No JVD present. No thyromegaly present.  Cardiovascular: Normal rate, regular rhythm and normal heart sounds.   No murmur heard. Pulmonary/Chest: Effort normal and breath sounds normal. No respiratory distress. He has no wheezes. He has no rales.  Abdominal: Soft. Bowel sounds are normal. He exhibits no distension. There is no tenderness. There is no rebound and no guarding.  Musculoskeletal: He exhibits no edema.  Lymphadenopathy:    He has no cervical adenopathy.  Neurological: He is alert and oriented to person,  place, and time. He has normal reflexes. He displays normal reflexes. No cranial nerve deficit. He exhibits normal muscle tone. Coordination normal.  Skin: No rash noted. No erythema.          Assessment & Plan:  Dehydration - Plan: COMPLETE METABOLIC PANEL WITH GFR, CBC with Differential, TSH, Urinalysis, Routine w reflex microscopic  Other malaise and fatigue - Plan: TSH  Patient has hypertension, dehydration. I believe this is the cause of his fatigue and dizziness and weakness. Therefore I recommended that he discontinue hydrochlorothiazide as well as clonidine. The patient has lost 10 pounds since when I last saw him in January. He has anorexia. I am not sure the cause of this. I will check a CBC, CMP, TSH, and urinalysis. I like to recheck the patient in 2 days to see if his blood pressure improved off of medication. I recommended he push fluids to try to rehydrate himself. Recheck in 48 hours or sooner if worse

## 2013-10-23 LAB — COMPLETE METABOLIC PANEL WITH GFR
ALT: 16 U/L (ref 0–53)
AST: 18 U/L (ref 0–37)
Albumin: 4.3 g/dL (ref 3.5–5.2)
Alkaline Phosphatase: 59 U/L (ref 39–117)
BUN: 20 mg/dL (ref 6–23)
CO2: 23 mEq/L (ref 19–32)
Calcium: 9.1 mg/dL (ref 8.4–10.5)
Chloride: 96 mEq/L (ref 96–112)
Creat: 1.11 mg/dL (ref 0.50–1.35)
GFR, Est African American: 73 mL/min
GFR, Est Non African American: 63 mL/min
Glucose, Bld: 122 mg/dL — ABNORMAL HIGH (ref 70–99)
Potassium: 4.2 mEq/L (ref 3.5–5.3)
Sodium: 132 mEq/L — ABNORMAL LOW (ref 135–145)
Total Bilirubin: 0.3 mg/dL (ref 0.2–1.2)
Total Protein: 6.3 g/dL (ref 6.0–8.3)

## 2013-10-23 LAB — URINALYSIS, ROUTINE W REFLEX MICROSCOPIC
Bilirubin Urine: NEGATIVE
Glucose, UA: NEGATIVE mg/dL
Hgb urine dipstick: NEGATIVE
Ketones, ur: NEGATIVE mg/dL
Leukocytes, UA: NEGATIVE
Nitrite: NEGATIVE
Protein, ur: NEGATIVE mg/dL
Specific Gravity, Urine: 1.018 (ref 1.005–1.030)
Urobilinogen, UA: 0.2 mg/dL (ref 0.0–1.0)
pH: 6 (ref 5.0–8.0)

## 2013-10-23 LAB — TSH: TSH: 4.011 u[IU]/mL (ref 0.350–4.500)

## 2013-10-24 ENCOUNTER — Other Ambulatory Visit: Payer: Self-pay | Admitting: Family Medicine

## 2013-10-25 ENCOUNTER — Encounter: Payer: Self-pay | Admitting: Family Medicine

## 2013-10-25 ENCOUNTER — Ambulatory Visit (INDEPENDENT_AMBULATORY_CARE_PROVIDER_SITE_OTHER): Payer: Medicare Other | Admitting: Family Medicine

## 2013-10-25 VITALS — BP 120/64 | HR 92 | Temp 97.8°F | Resp 16 | Ht 65.0 in | Wt 220.0 lb

## 2013-10-25 DIAGNOSIS — E860 Dehydration: Secondary | ICD-10-CM | POA: Diagnosis not present

## 2013-10-25 DIAGNOSIS — I6340 Cerebral infarction due to embolism of unspecified cerebral artery: Secondary | ICD-10-CM | POA: Diagnosis not present

## 2013-10-25 DIAGNOSIS — R5381 Other malaise: Secondary | ICD-10-CM | POA: Diagnosis not present

## 2013-10-25 DIAGNOSIS — R5383 Other fatigue: Secondary | ICD-10-CM | POA: Diagnosis not present

## 2013-10-25 NOTE — Progress Notes (Signed)
Subjective:    Patient ID: [name], male    DOB: [birth year], 80 y.o.   MRN: [id number]  HPI 10/22/13 Patient has had very little appetite for one week. He reports some nausea. He denies any vomiting or diarrhea. He denies any fever. He denies any coughing. He denies any melena or hematochezia. He denies any bleeding. He denies any dysuria. He denies any polyuria. He does report dry mouth and dizziness. He also complains of profound fatigue. He denies any chest pain shortness of breath or dyspnea on exertion. Today but his blood pressure is extremely low at 80/52. I rechecked his blood pressure in down 90/52. He denies any syncope or presyncope although he does have dizziness upon standing. He denies any diarrhea. His daughter recently had viral gastrointestinal tract infection.  At that time, my plan was: Other malaise and fatigue  Dehydration   Patient has hypertension, dehydration. I believe this is the cause of his fatigue and dizziness and weakness. Therefore I recommended that he discontinue hydrochlorothiazide as well as clonidine. The patient has lost 10 pounds since when I last saw him in January. He has anorexia. I am not sure the cause of this. I will check a CBC, CMP, TSH, and urinalysis. I like to recheck the patient in 2 days to see if his blood pressure improved off of medication. I recommended he push fluids to try to rehydrate himself. Recheck in 48 hours or sooner if worse.  10/25/13 Patient is doing much better. He has gained 3 pounds since last office visit. His appetite has improved as his fatigue has improved. His blood pressure is also much better at 120/64 Past Medical History  Diagnosis Date  . Dyslipidemia   . Seizure disorder   . Hypothyroidism   . DDD (degenerative disc disease)   . Hx MRSA infection     left buttocks abscess  . Rotator cuff injury     chronic rotator cuff injury status post repair  . Hypertension   . Syncope   . Dizziness   . Meniere's  disease     Status post shunt  . BPH (benign prostatic hyperplasia)   . Diabetes mellitus without complication   . Hyperlipidemia   . Coronary artery disease     a. s/p MI tx with Cypher DES to pRCA in 4/04;  b. Echocardiogram 7/09: Normal LV function.  c. Nuclear study 3/13 no ischemia;  d. ETT 2/14 neg;  e. admx with CP => LHC (01/30/2013):  pLAD 40-50%, oD1 70-80%, oOM1 40%, pRCA stent patent, mid RCA 30%, EF 60-65%. => med Rx.  . Stroke     a. 01/2013=> post cardiac cath CVA to L post communicating artery system; R sided weakness  . Hx of echocardiogram     a. Echocardiogram (01/31/2013): Mild focal basal and mild concentric hypertrophy of the septum, EF 50-55%, normal wall motion, grade 1 diastolic dysfunction, trivial AI, MAC, mild LAE, PASP 35  . Occlusion and stenosis of carotid artery without mention of cerebral infarction     40-59% on carotid doppler 2014; [place] (01/2013): R 1-39%, L 60-79%   Past Surgical History  Procedure Laterality Date  . Coronary angioplasty with stent placement  07/14/2002    Stent to the right coronary artery  . Rotator cuff repair      for chronic rotator cuff injury  . Hernia repair  04/10/2008    scrotal hernia repair  . Endolymphatic shunt decompression  06/11/2009  Right endolymphatic sac decompression and shunt  placement   Current Outpatient Prescriptions on File Prior to Visit  Medication Sig Dispense Refill  . atorvastatin (LIPITOR) 80 MG tablet Take 1 tablet (80 mg total) by mouth daily.  90 tablet  3  . carbamazepine (CARBATROL) 300 MG 12 hr capsule Take 1 capsule (300 mg total) by mouth 2 (two) times daily.  60 capsule  5  . clopidogrel (PLAVIX) 75 MG tablet Take 75 mg by mouth daily with breakfast.      . diazepam (VALIUM) 2 MG tablet TAKE 1 TABLET BY MOUTH TWICE DAILY.  60 tablet  2  . isosorbide mononitrate (IMDUR) 30 MG 24 hr tablet TAKE 1 TABLET BY MOUTH EVERY DAY  30 tablet  5  . levothyroxine (SYNTHROID, LEVOTHROID) 25 MCG tablet  TAKE 2 TABLETS BY MOUTH DAILY BEFORE BREAKFAST  30 tablet  11  . losartan (COZAAR) 100 MG tablet TAKE 0.5 TABLETS (50 MG TOTAL) BY MOUTH 2 (TWO) TIMES DAILY.  30 tablet  6  . metFORMIN (GLUCOPHAGE) 500 MG tablet TAKE 1 TABLET BY MOUTH 2 TIMES DAILY WITH A MEAL.  60 tablet  5  . metoprolol (LOPRESSOR) 50 MG tablet TAKE 1 TABLET TWICE A DAY  60 tablet  11  . Multiple Vitamins-Minerals (MULTIVITAMINS THER. W/MINERALS) TABS Take 1 tablet by mouth at bedtime.        . nitroGLYCERIN (NITROSTAT) 0.4 MG SL tablet Place 1 tablet (0.4 mg total) under the tongue every 5 (five) minutes as needed for chest pain.  25 tablet  3  . pantoprazole (PROTONIX) 40 MG tablet TAKE 1 TABLET BY MOUTH EVERY DAY  30 tablet  11  . silodosin (RAPAFLO) 8 MG CAPS capsule Take 8 mg by mouth daily with breakfast.        No current facility-administered medications on file prior to visit.   No Known Allergies History   Social History  . Marital Status: Married    Spouse Name: N/A    Number of Children: 4  . Years of Education: 12th   Occupational History  . Retired    Social History Main Topics  . Smoking status: Former Smoker    Quit date: 08/19/1961  . Smokeless tobacco: Not on file  . Alcohol Use: No  . Drug Use: No  . Sexual Activity: No   Other Topics Concern  . Not on file   Social History Narrative   Lives with wife.      Review of Systems  All other systems reviewed and are negative.      Objective:   Physical Exam  Vitals reviewed. Constitutional: He is oriented to person, place, and time.  HENT:  Nose: Nose normal.  Mouth/Throat: Mucous membranes are dry. No oropharyngeal exudate.  Eyes: Conjunctivae are normal. No scleral icterus.  Neck: Neck supple. No JVD present. No thyromegaly present.  Cardiovascular: Normal rate, regular rhythm and normal heart sounds.   No murmur heard. Pulmonary/Chest: Effort normal and breath sounds normal. No respiratory distress. He has no wheezes. He has  no rales.  Abdominal: Soft. Bowel sounds are normal. He exhibits no distension. There is no tenderness. There is no rebound and no guarding.  Musculoskeletal: He exhibits no edema.  Lymphadenopathy:    He has no cervical adenopathy.  Neurological: He is alert and oriented to person, place, and time. He has normal reflexes. No cranial nerve deficit. He exhibits normal muscle tone. Coordination normal.  Skin: No rash noted. No erythema.  Assessment & Plan:  Other malaise and fatigue  Dehydration   Patient is clinically improving. He still has a poor appetite. On recheck the patient in one week. If his weight continues to improve and his blood pressure remained stable no further followup is necessary. If he continues to experience appetite, may want to consider a GI consultation for EGD.

## 2013-10-25 NOTE — Telephone Encounter (Signed)
Refill appropriate and filled per protocol. 

## 2013-11-01 ENCOUNTER — Ambulatory Visit (INDEPENDENT_AMBULATORY_CARE_PROVIDER_SITE_OTHER): Payer: Medicare Other | Admitting: Family Medicine

## 2013-11-01 ENCOUNTER — Encounter: Payer: Self-pay | Admitting: Family Medicine

## 2013-11-01 ENCOUNTER — Other Ambulatory Visit: Payer: Self-pay | Admitting: Family Medicine

## 2013-11-01 VITALS — BP 142/70 | HR 78 | Temp 98.2°F | Resp 18 | Ht 65.0 in | Wt 222.0 lb

## 2013-11-01 DIAGNOSIS — R5383 Other fatigue: Secondary | ICD-10-CM | POA: Diagnosis not present

## 2013-11-01 DIAGNOSIS — R634 Abnormal weight loss: Secondary | ICD-10-CM | POA: Diagnosis not present

## 2013-11-01 DIAGNOSIS — R7989 Other specified abnormal findings of blood chemistry: Secondary | ICD-10-CM | POA: Diagnosis not present

## 2013-11-01 DIAGNOSIS — I6340 Cerebral infarction due to embolism of unspecified cerebral artery: Secondary | ICD-10-CM | POA: Diagnosis not present

## 2013-11-01 DIAGNOSIS — R5381 Other malaise: Secondary | ICD-10-CM | POA: Diagnosis not present

## 2013-11-01 DIAGNOSIS — E291 Testicular hypofunction: Secondary | ICD-10-CM | POA: Diagnosis not present

## 2013-11-01 DIAGNOSIS — Z79899 Other long term (current) drug therapy: Secondary | ICD-10-CM | POA: Diagnosis not present

## 2013-11-01 LAB — TESTOSTERONE: Testosterone: 212 ng/dL — ABNORMAL LOW (ref 300–890)

## 2013-11-01 LAB — COMPLETE METABOLIC PANEL WITH GFR
ALT: 15 U/L (ref 0–53)
AST: 16 U/L (ref 0–37)
Albumin: 4.1 g/dL (ref 3.5–5.2)
Alkaline Phosphatase: 61 U/L (ref 39–117)
BUN: 11 mg/dL (ref 6–23)
CO2: 29 mEq/L (ref 19–32)
Calcium: 9.3 mg/dL (ref 8.4–10.5)
Chloride: 100 mEq/L (ref 96–112)
Creat: 0.93 mg/dL (ref 0.50–1.35)
GFR, Est African American: >89 mL/min
GFR, Est Non African American: 78 mL/min
Glucose, Bld: 112 mg/dL — ABNORMAL HIGH (ref 70–99)
Potassium: 4.8 mEq/L (ref 3.5–5.3)
Sodium: 138 mEq/L (ref 135–145)
Total Bilirubin: 0.4 mg/dL (ref 0.2–1.2)
Total Protein: 6.4 g/dL (ref 6.0–8.3)

## 2013-11-01 LAB — VITAMIN B12: Vitamin B-12: 1951 pg/mL — ABNORMAL HIGH (ref 211–911)

## 2013-11-01 LAB — IRON: Iron: 108 ug/dL (ref 42–165)

## 2013-11-01 NOTE — Progress Notes (Signed)
Subjective:    Patient ID: [name], male    DOB: Mar 14, 1934, 80 y.o.   MRN: [id number]  HPI 10/22/13 Patient has had very little appetite for one week. He reports some nausea. He denies any vomiting or diarrhea. He denies any fever. He denies any coughing. He denies any melena or hematochezia. He denies any bleeding. He denies any dysuria. He denies any polyuria. He does report dry mouth and dizziness. He also complains of profound fatigue. He denies any chest pain shortness of breath or dyspnea on exertion. Today but his blood pressure is extremely low at 80/52. I rechecked his blood pressure in down 90/52. He denies any syncope or presyncope although he does have dizziness upon standing. He denies any diarrhea. His daughter recently had viral gastrointestinal tract infection.  At that time, my plan was: Other malaise and fatigue  Dehydration   Patient has hypertension, dehydration. I believe this is the cause of his fatigue and dizziness and weakness. Therefore I recommended that he discontinue hydrochlorothiazide as well as clonidine. The patient has lost 10 pounds since when I last saw him in January. He has anorexia. I am not sure the cause of this. I will check a CBC, CMP, TSH, and urinalysis. I like to recheck the patient in 2 days to see if his blood pressure improved off of medication. I recommended he push fluids to try to rehydrate himself. Recheck in 48 hours or sooner if worse.  10/25/13 Patient is doing much better. He has gained 3 pounds since last office visit. His appetite has improved as his fatigue has improved. His blood pressure is also much better at 120/64.  At that time, my plan was: Patient is clinically improving. He still has a poor appetite. On recheck the patient in one week. If his weight continues to improve and his blood pressure remained stable no further followup is necessary. If he continues to experience appetite, may want to consider a GI consultation for  EGD.  11/01/13 Patient is here today to recheck his blood pressure.  Patient has gained an additional 2 pounds since his last office visit. His weight is up from 220-222. His blood pressure is also steadily increasing to 142/70. Unfortunately patient continues to complain of fatigue. He states his weakness is gradually getting worse. He has no stamina. He denies any chest pain. He denies any shortness of breath or dyspnea on exertion. He complains of just generalized malaise. He denies any depression.  He denies any abdominal pain. He continues to complain of poor appetite. Also complains of occasional loose stools that have been plaguing him ever since he was hospitalized for a stroke in November. He denies any cough or hemoptysis. He denies any fevers or chills. His prostate was recently evaluated by his urologist and was found to be normal. He is overdue for colonoscopy. Past Medical History  Diagnosis Date  . Dyslipidemia   . Seizure disorder   . Hypothyroidism   . DDD (degenerative disc disease)   . Hx MRSA infection     left buttocks abscess  . Rotator cuff injury     chronic rotator cuff injury status post repair  . Hypertension   . Syncope   . Dizziness   . Meniere's disease     Status post shunt  . BPH (benign prostatic hyperplasia)   . Diabetes mellitus without complication   . Hyperlipidemia   . Coronary artery disease     a. s/p MI tx with  Cypher DES to pRCA in 4/04;  b. Echocardiogram 7/09: Normal LV function.  c. Nuclear study 3/13 no ischemia;  d. ETT 2/14 neg;  e. admx with CP => LHC (01/30/2013):  pLAD 40-50%, oD1 70-80%, oOM1 40%, pRCA stent patent, mid RCA 30%, EF 60-65%. => med Rx.  . Stroke     a. 01/2013=> post cardiac cath CVA to L post communicating artery system; R sided weakness  . Hx of echocardiogram     a. Echocardiogram (01/31/2013): Mild focal basal and mild concentric hypertrophy of the septum, EF 50-55%, normal wall motion, grade 1 diastolic dysfunction,  trivial AI, MAC, mild LAE, PASP 35  . Occlusion and stenosis of carotid artery without mention of cerebral infarction     40-59% on carotid doppler 2014; [place] (01/2013): R 1-39%, L 60-79%   Past Surgical History  Procedure Laterality Date  . Coronary angioplasty with stent placement  07/14/2002    Stent to the right coronary artery  . Rotator cuff repair      for chronic rotator cuff injury  . Hernia repair  04/10/2008    scrotal hernia repair  . Endolymphatic shunt decompression  06/11/2009     Right endolymphatic sac decompression and shunt  placement   Current Outpatient Prescriptions on File Prior to Visit  Medication Sig Dispense Refill  . atorvastatin (LIPITOR) 80 MG tablet Take 1 tablet (80 mg total) by mouth daily.  90 tablet  3  . carbamazepine (CARBATROL) 300 MG 12 hr capsule Take 1 capsule (300 mg total) by mouth 2 (two) times daily.  60 capsule  5  . clopidogrel (PLAVIX) 75 MG tablet Take 75 mg by mouth daily with breakfast.      . diazepam (VALIUM) 2 MG tablet TAKE 1 TABLET BY MOUTH TWICE DAILY.  60 tablet  2  . isosorbide mononitrate (IMDUR) 30 MG 24 hr tablet TAKE 1 TABLET BY MOUTH EVERY DAY  30 tablet  5  . levothyroxine (SYNTHROID, LEVOTHROID) 25 MCG tablet TAKE 2 TABLETS BY MOUTH DAILY BEFORE BREAKFAST  30 tablet  11  . losartan (COZAAR) 100 MG tablet TAKE 0.5 TABLETS (50 MG TOTAL) BY MOUTH 2 (TWO) TIMES DAILY.  30 tablet  6  . metFORMIN (GLUCOPHAGE) 500 MG tablet TAKE 1 TABLET BY MOUTH 2 TIMES DAILY WITH A MEAL.  60 tablet  5  . metoprolol (LOPRESSOR) 50 MG tablet TAKE 1 TABLET TWICE A DAY  60 tablet  11  . Multiple Vitamins-Minerals (MULTIVITAMINS THER. W/MINERALS) TABS Take 1 tablet by mouth at bedtime.        . nitroGLYCERIN (NITROSTAT) 0.4 MG SL tablet Place 1 tablet (0.4 mg total) under the tongue every 5 (five) minutes as needed for chest pain.  25 tablet  3  . pantoprazole (PROTONIX) 40 MG tablet TAKE 1 TABLET BY MOUTH EVERY DAY  30 tablet  11  . silodosin  (RAPAFLO) 8 MG CAPS capsule Take 8 mg by mouth daily with breakfast.        No current facility-administered medications on file prior to visit.   No Known Allergies History   Social History  . Marital Status: Married    Spouse Name: N/A    Number of Children: 4  . Years of Education: 12th   Occupational History  . Retired    Social History Main Topics  . Smoking status: Former Smoker    Quit date: 08/19/1961  . Smokeless tobacco: Not on file  . Alcohol Use: No  . Drug  Use: No  . Sexual Activity: No   Other Topics Concern  . Not on file   Social History Narrative   Lives with wife.      Review of Systems  All other systems reviewed and are negative.      Objective:   Physical Exam  Vitals reviewed. Constitutional: He is oriented to person, place, and time.  HENT:  Nose: Nose normal.  Mouth/Throat: Mucous membranes are dry. No oropharyngeal exudate.  Eyes: Conjunctivae are normal. No scleral icterus.  Neck: Neck supple. No JVD present. No thyromegaly present.  Cardiovascular: Normal rate, regular rhythm and normal heart sounds.   No murmur heard. Pulmonary/Chest: Effort normal and breath sounds normal. No respiratory distress. He has no wheezes. He has no rales.  Abdominal: Soft. Bowel sounds are normal. He exhibits no distension. There is no tenderness. There is no rebound and no guarding.  Musculoskeletal: He exhibits no edema.  Lymphadenopathy:    He has no cervical adenopathy.  Neurological: He is alert and oriented to person, place, and time. He has normal reflexes. No cranial nerve deficit. He exhibits normal muscle tone. Coordination normal.  Skin: No rash noted. No erythema.          Assessment & Plan:  1. Loss of weight - Sedimentation rate - Vitamin B12 - Iron - DG Chest 2 View; Future - Testosterone - COMPLETE METABOLIC PANEL WITH GFR - Ambulatory referral to Gastroenterology  2. Other malaise and fatigue - Sedimentation rate -  Vitamin B12 - Iron - DG Chest 2 View; Future - Testosterone - COMPLETE METABOLIC PANEL WITH GFR  Patient's initial lab work was normal. His TSH and CBC were within normal limits. Now check a vitamin B12 level, and iron level, testosterone level. I will check a chest x-ray to evaluate for signs of lymphoma or lung cancer. I will schedule the patient for a colonoscopy his colon cancer screening up to date particularly given his loose stools and poor appetite. I proceed further based on the following results.

## 2013-11-02 LAB — SEDIMENTATION RATE: Sed Rate: 1 mm/hr (ref 0–16)

## 2013-11-03 LAB — FSH/LH
FSH: 9.2 m[IU]/mL (ref 1.4–18.1)
LH: 7.8 m[IU]/mL (ref 3.1–34.6)

## 2013-11-03 LAB — PROLACTIN: Prolactin: 5.5 ng/mL (ref 2.1–17.1)

## 2013-11-05 LAB — TESTOSTERONE, FREE, TOTAL, SHBG
Sex Hormone Binding: 53 nmol/L (ref 13–71)
Testosterone, Free: 29.6 pg/mL — ABNORMAL LOW (ref 47.0–244.0)
Testosterone-% Free: 1.4 % — ABNORMAL LOW (ref 1.6–2.9)
Testosterone: 212 ng/dL — ABNORMAL LOW (ref 300–890)

## 2013-11-06 ENCOUNTER — Other Ambulatory Visit: Payer: Self-pay | Admitting: Family Medicine

## 2013-11-06 MED ORDER — TESTOSTERONE CYPIONATE 200 MG/ML IM SOLN: 200.0000 mg | INTRAMUSCULAR | Status: DC

## 2013-11-08 ENCOUNTER — Other Ambulatory Visit (HOSPITAL_COMMUNITY): Payer: Self-pay | Admitting: Family Medicine

## 2013-11-08 ENCOUNTER — Ambulatory Visit (INDEPENDENT_AMBULATORY_CARE_PROVIDER_SITE_OTHER): Payer: Medicare Other | Admitting: Family Medicine

## 2013-11-08 DIAGNOSIS — E349 Endocrine disorder, unspecified: Secondary | ICD-10-CM

## 2013-11-08 DIAGNOSIS — E291 Testicular hypofunction: Secondary | ICD-10-CM

## 2013-11-08 MED ORDER — TESTOSTERONE CYPIONATE 200 MG/ML IM SOLN
200.0000 mg | INTRAMUSCULAR | Status: DC
  Administered 2013-11-08 – 2014-01-22 (×2): 200 mg via INTRAMUSCULAR

## 2013-11-09 ENCOUNTER — Other Ambulatory Visit: Payer: Self-pay | Admitting: Family Medicine

## 2013-11-09 MED ORDER — LEVOTHYROXINE SODIUM 25 MCG PO TABS: ORAL_TABLET | ORAL | Status: DC

## 2013-11-14 ENCOUNTER — Telehealth: Payer: Self-pay | Admitting: *Deleted

## 2013-11-14 NOTE — Telephone Encounter (Signed)
Pt came into office with c/o his bp medication, pt is on HCTZ 25mg  and Clonidine 0.1mg (hasnt taken since instructed not too by provider) pt states his BP has now been stable. I took his BP today and it was 138/70. Pt states has been feeling sluggish and decreased appetite for 3 days. Pt wants to know if you want him to get back on these medications or stay off? Please advise

## 2013-11-15 NOTE — Telephone Encounter (Signed)
Tried to call no answer and no vm 

## 2013-11-15 NOTE — Telephone Encounter (Signed)
bp is still good, continue to remain off the meds for now.

## 2013-11-16 NOTE — Telephone Encounter (Signed)
Pt aware.

## 2013-11-19 ENCOUNTER — Ambulatory Visit
Admission: RE | Admit: 2013-11-19 | Discharge: 2013-11-19 | Disposition: A | Discharge status: Home / Self Care | Payer: Medicare Other | Source: Ambulatory Visit | Attending: Family Medicine | Admitting: Family Medicine

## 2013-11-19 DIAGNOSIS — R634 Abnormal weight loss: Secondary | ICD-10-CM | POA: Diagnosis not present

## 2013-11-19 DIAGNOSIS — R5381 Other malaise: Secondary | ICD-10-CM | POA: Diagnosis not present

## 2013-11-19 DIAGNOSIS — R5383 Other fatigue: Secondary | ICD-10-CM

## 2013-11-22 ENCOUNTER — Ambulatory Visit (INDEPENDENT_AMBULATORY_CARE_PROVIDER_SITE_OTHER): Payer: Medicare Other | Admitting: *Deleted

## 2013-11-22 DIAGNOSIS — E291 Testicular hypofunction: Secondary | ICD-10-CM | POA: Diagnosis not present

## 2013-11-22 DIAGNOSIS — R7989 Other specified abnormal findings of blood chemistry: Secondary | ICD-10-CM

## 2013-11-22 MED ORDER — TESTOSTERONE CYPIONATE 200 MG/ML IM SOLN
200.0000 mg | Freq: Once | INTRAMUSCULAR | Status: AC
  Administered 2013-11-22: 200 mg via INTRAMUSCULAR

## 2013-12-06 ENCOUNTER — Telehealth: Payer: Self-pay | Admitting: *Deleted

## 2013-12-06 ENCOUNTER — Ambulatory Visit (INDEPENDENT_AMBULATORY_CARE_PROVIDER_SITE_OTHER): Payer: Medicare Other | Admitting: Nurse Practitioner

## 2013-12-06 ENCOUNTER — Encounter: Payer: Self-pay | Admitting: Nurse Practitioner

## 2013-12-06 VITALS — BP 158/78 | HR 76 | Ht 66.25 in | Wt 226.1 lb

## 2013-12-06 DIAGNOSIS — D689 Coagulation defect, unspecified: Secondary | ICD-10-CM

## 2013-12-06 DIAGNOSIS — Z1211 Encounter for screening for malignant neoplasm of colon: Secondary | ICD-10-CM

## 2013-12-06 DIAGNOSIS — I6340 Cerebral infarction due to embolism of unspecified cerebral artery: Secondary | ICD-10-CM | POA: Diagnosis not present

## 2013-12-06 DIAGNOSIS — R198 Other specified symptoms and signs involving the digestive system and abdomen: Secondary | ICD-10-CM

## 2013-12-06 DIAGNOSIS — R194 Change in bowel habit: Secondary | ICD-10-CM

## 2013-12-06 NOTE — Progress Notes (Signed)
HPI :  Patient is a 80 year old male known remotely to Dr. Olevia Perches. He had a colonoscopy February 2005 for evaluation of constipation and abdominal pain. Endoscopically there was mild, nonspecific left-sided colitis but biopsies revealed benign colonic mucosa.  Patient is self-referred for colon cancer screening. He also wants to discuss bowel changes which started about one year ago. Patient had a stroke last November. He developed loose stools while hospitalized for the stroke and has continued to have frequent loose stools ever since. Stools are foul smelling. Granddaughter is a Marine scientist and suggested he could have C. Difficile. Patient states he dropped off stool specimens to PCP on Monday but I don't see any results in computer so we are calling to check on this. Patient has no abdominal pain, no blood in his stools. He does not recall starting any new medications last November. He has been on Metformin for years.  Pantoprazole is lisited on home medication list, patient does not even know if he is taking it. No GERD symptoms in years.   Past Medical History  Diagnosis Date  . Dyslipidemia   . Seizure disorder   . Hypothyroidism   . DDD (degenerative disc disease)   . Hx MRSA infection     left buttocks abscess  . Rotator cuff injury     chronic rotator cuff injury status post repair  . Hypertension   . Meniere's disease     Status post shunt  . BPH (benign prostatic hyperplasia)   . Diabetes mellitus without complication   . Hyperlipidemia   . Coronary artery disease     a. s/p MI tx with Cypher DES to pRCA in 4/04;  b. Echocardiogram 7/09: Normal LV function.  c. Nuclear study 3/13 no ischemia;  d. ETT 2/14 neg;  e. admx with CP => LHC (01/30/2013):  pLAD 40-50%, oD1 70-80%, oOM1 40%, pRCA stent patent, mid RCA 30%, EF 60-65%. => med Rx.  . Stroke     a. 01/2013=> post cardiac cath CVA to L post communicating artery system; R sided weakness  . Hx of echocardiogram     a.  Echocardiogram (01/31/2013): Mild focal basal and mild concentric hypertrophy of the septum, EF 50-55%, normal wall motion, grade 1 diastolic dysfunction, trivial AI, MAC, mild LAE, PASP 35  . Occlusion and stenosis of carotid artery without mention of cerebral infarction     40-59% on carotid doppler 2014; [place] (01/2013): R 1-39%, L 60-79%    Family History  Problem Relation Age of Onset  . Heart attack Father 3  . Aneurysm Mother     brain aneurysm  . Coronary artery disease Brother     with CABG  . Diabetes Brother   . Colon cancer Neg Hx   . Colon polyps Neg Hx    History  Substance Use Topics  . Smoking status: Former Smoker    Quit date: 08/19/1961  . Smokeless tobacco: Former Systems developer    Types: Chew  . Alcohol Use: No   Current Outpatient Prescriptions  Medication Sig Dispense Refill  . atorvastatin (LIPITOR) 80 MG tablet Take 1 tablet (80 mg total) by mouth daily.  90 tablet  3  . carbamazepine (CARBATROL) 300 MG 12 hr capsule Take 1 capsule (300 mg total) by mouth 2 (two) times daily.  60 capsule  5  . clopidogrel (PLAVIX) 75 MG tablet Take 75 mg by mouth daily with breakfast.      . diazepam (VALIUM) 2 MG tablet TAKE 1  TABLET BY MOUTH TWICE DAILY.  60 tablet  2  . isosorbide mononitrate (IMDUR) 30 MG 24 hr tablet TAKE 1 TABLET BY MOUTH EVERY DAY  30 tablet  5  . levothyroxine (SYNTHROID, LEVOTHROID) 25 MCG tablet TAKE 2 TABLETS BY MOUTH DAILY BEFORE BREAKFAST  60 tablet  11  . losartan (COZAAR) 100 MG tablet TAKE 0.5 TABLETS (50 MG TOTAL) BY MOUTH 2 (TWO) TIMES DAILY.  30 tablet  6  . metFORMIN (GLUCOPHAGE) 500 MG tablet TAKE 1 TABLET BY MOUTH 2 TIMES DAILY WITH A MEAL.  60 tablet  5  . metoprolol (LOPRESSOR) 50 MG tablet TAKE 1 TABLET TWICE A DAY  60 tablet  11  . Multiple Vitamins-Minerals (MULTIVITAMINS THER. W/MINERALS) TABS Take 1 tablet by mouth at bedtime.        . nitroGLYCERIN (NITROSTAT) 0.4 MG SL tablet Place 1 tablet (0.4 mg total) under the tongue every 5 (five)  minutes as needed for chest pain.  25 tablet  3  . pantoprazole (PROTONIX) 40 MG tablet TAKE 1 TABLET BY MOUTH EVERY DAY  30 tablet  11  . silodosin (RAPAFLO) 8 MG CAPS capsule Take 8 mg by mouth daily with breakfast.       . testosterone cypionate (DEPO-TESTOSTERONE) 200 MG/ML injection Inject 1 mL (200 mg total) into the muscle every 14 (fourteen) days.  10 mL  3   Current Facility-Administered Medications  Medication Dose Route Frequency Provider Last Rate Last Dose  . testosterone cypionate (DEPOTESTOTERONE CYPIONATE) injection 200 mg  200 mg Intramuscular Q14 Days [name], MD   200 mg at 11/08/13 1628   No Known Allergies   Review of Systems: All systems reviewed and negative except where noted in HPI.    Dg Chest 2 View  11/19/2013   CLINICAL DATA:  Weight loss, weakness  EXAM: CHEST  2 VIEW  COMPARISON:  None.  FINDINGS: Cardiomediastinal silhouette is stable. Elevation of the right hemidiaphragm again noted. Degenerative changes thoracic spine. No acute infiltrate or pulmonary edema.  IMPRESSION: No active cardiopulmonary disease.   Electronically Signed   By: Lahoma Crocker M.D.   On: 11/19/2013 11:02    Physical Exam: BP 158/78  Pulse 76  Ht 5' 6.25" (1.683 m)  Wt 226 lb 2 oz (102.57 kg)  BMI 36.21 kg/m2 Constitutional: Pleasant,well-developed, white male in no acute distress. HEENT: Normocephalic and atraumatic. Conjunctivae are normal. No scleral icterus. Neck supple.  Cardiovascular: Normal rate, regular rhythm.  Pulmonary/chest: Effort normal and breath sounds normal. No wheezing, rales or rhonchi. Abdominal: Soft, nondistended, nontender. Bowel sounds active throughout. There are no masses palpable. No hepatomegaly. Extremities: no edema Lymphadenopathy: No cervical adenopathy noted. Neurological: Alert and oriented to person place and time. Skin: Skin is warm and dry. No rashes noted. Psychiatric: Normal mood and affect. Behavior is normal.   Cardiac Cath  November 2014 Final Conclusions: Patent RCA stent. There was 40-50% proximal LAD stenosis at D1 with 70-80% ostial D1 stenosis. D1 disease looks like it could potentially cause exertional chest pain, but would be surprised if this could have caused the 2 hours of chest pain at rest he had yesterday. I reviewed with Dr. Martin[place]: intervention would be difficult given the disease in the LAD at D1 take-off, could potentially perform balloon angioplasty. Will plan on medical management   ASSESSMENT AND PLAN:  60. 80 year old male for colon cancer screening. The risks, benefits, and alternatives to colonoscopy with possible biopsy and possible polypectomy were discussed  with the patient and he consents to proceed. Patient is on Plavix,  will contact prescribing provider about holding it for colonoscopy  2. Chronic loose stool, started while hospitalized for CVA last November. PCP supposedly has stool studies processing, will await those results. If negative, will consider random colon biopsies at time of colonoscopy.   3. CAD, s/p remote stent placement. Last Cath November 2014 with findings as above.  No chest pains.  4. History of CVA following cardiac cath last November

## 2013-12-06 NOTE — Telephone Encounter (Signed)
Patient is on Plavix both for secondary prevention of stroke as well as his known coronary artery disease. I am okay with the patient holding Plavix for one week prior to endoscopy for his stroke prevention. However he should also contact [facility] to make sure they are aware due to his CAD (He last saw Richardson Dopp).    Thanks. Margaretmary Eddy

## 2013-12-06 NOTE — Patient Instructions (Signed)
You have been scheduled for a colonoscopy. Please follow written instructions given to you at your visit today.   If you use inhalers (even only as needed), please bring them with you on the day of your procedure. Your physician has requested that you go to www.[email or web address].com and enter the access code given to you at your visit today. This web site gives a general overview about your procedure. However, you should still follow specific instructions given to you by our office regarding your preparation for the procedure.  We will call you once we hear back from Dr. Jenna Luo regading the Plavix Everetts.

## 2013-12-06 NOTE — Telephone Encounter (Signed)
12/06/2013   RE: [name] DOB: 10-10-1933 MRN: [id number]   Dear Dr. Jenna Luo,    We have scheduled the above patient for an endoscopic procedure, Colonoscopy. Our records show that he is on anticoagulation therapy.   Please advise as to how long the patient may come off his therapy of Plavix prior to the procedure, which is scheduled for 12-19-2013.  Please fax back/ or route the completed form to [place] at [phone].   Sincerely,    Tye Savoy NP-C     [place]

## 2013-12-06 NOTE — Telephone Encounter (Signed)
12/06/2013   RE: [name] DOB: 08-17-1933 MRN: [id number]   Dear Dr. Jenna Luo,    We have scheduled the above patient for an endoscopic procedure, Colonoscopy. Our records show that he is on anticoagulation therapy.   Please advise as to how long the patient may come off his therapy of Plavix prior to the procedure, which is scheduled for 12-19-2013.  Please fax back/ or route the completed form to [facility] at [phone].   Sincerely,    Tye Savoy NP-C     [place]

## 2013-12-07 DIAGNOSIS — R159 Full incontinence of feces: Secondary | ICD-10-CM | POA: Insufficient documentation

## 2013-12-07 DIAGNOSIS — R194 Change in bowel habit: Secondary | ICD-10-CM | POA: Insufficient documentation

## 2013-12-07 DIAGNOSIS — Z1211 Encounter for screening for malignant neoplasm of colon: Secondary | ICD-10-CM | POA: Insufficient documentation

## 2013-12-07 NOTE — Telephone Encounter (Signed)
Dr. Minus Breeding was the last person to see him. I will forward to him to get his thoughts. Richardson Dopp, PA-C   12/07/2013 1:21 PM

## 2013-12-07 NOTE — Progress Notes (Signed)
Reviewed and agree. Awaiting C.Diff

## 2013-12-10 ENCOUNTER — Ambulatory Visit (INDEPENDENT_AMBULATORY_CARE_PROVIDER_SITE_OTHER): Payer: Medicare Other | Admitting: *Deleted

## 2013-12-10 DIAGNOSIS — E291 Testicular hypofunction: Secondary | ICD-10-CM | POA: Diagnosis not present

## 2013-12-10 DIAGNOSIS — R7989 Other specified abnormal findings of blood chemistry: Secondary | ICD-10-CM

## 2013-12-10 MED ORDER — TESTOSTERONE CYPIONATE 200 MG/ML IM SOLN
200.0000 mg | Freq: Once | INTRAMUSCULAR | Status: AC
  Administered 2013-12-10: 200 mg via INTRAMUSCULAR

## 2013-12-10 NOTE — Telephone Encounter (Signed)
OK to hold Plavix as needed.

## 2013-12-11 NOTE — Telephone Encounter (Signed)
I called the patient's home number and got no answer. I called his cell number and left a message for him to stop his Plavix on 9-24 and resume it on 01-20-2014.  I asked him to call me and let me know he got this message.

## 2013-12-13 ENCOUNTER — Ambulatory Visit (INDEPENDENT_AMBULATORY_CARE_PROVIDER_SITE_OTHER): Payer: Medicare Other | Admitting: Family Medicine

## 2013-12-13 ENCOUNTER — Encounter: Payer: Self-pay | Admitting: Family Medicine

## 2013-12-13 VITALS — BP 182/98 | HR 72 | Temp 97.7°F | Resp 14 | Ht 66.93 in | Wt 224.0 lb

## 2013-12-13 DIAGNOSIS — I6340 Cerebral infarction due to embolism of unspecified cerebral artery: Secondary | ICD-10-CM | POA: Diagnosis not present

## 2013-12-13 DIAGNOSIS — R197 Diarrhea, unspecified: Secondary | ICD-10-CM | POA: Diagnosis not present

## 2013-12-13 DIAGNOSIS — R198 Other specified symptoms and signs involving the digestive system and abdomen: Secondary | ICD-10-CM | POA: Diagnosis not present

## 2013-12-13 NOTE — Progress Notes (Signed)
Subjective:    Patient ID: [name], male    DOB: June 30, 1933, 80 y.o.   MRN: [id number]  HPI Patient is scheduled to undergo colonoscopy next week due to chronic intermittent diarrhea that has been present for 9 months. Patient states that the diarrhea will come and go. He is loose stool which is foul smelling and occurs 4-5 times per day. He'll go weeks with diarrhea and the diarrhea be a daily phenomenon for several weeks at a time. He would like to have his stools checked for infection. His blood pressures extremely elevated today. However at the last office visit we discontinued his blood pressure medications due to low blood pressure. Past Medical History  Diagnosis Date  . Dyslipidemia   . Seizure disorder   . Hypothyroidism   . DDD (degenerative disc disease)   . Hx MRSA infection     left buttocks abscess  . Rotator cuff injury     chronic rotator cuff injury status post repair  . Hypertension   . Syncope   . Meniere's disease     Status post shunt  . BPH (benign prostatic hyperplasia)   . Diabetes mellitus without complication   . Hyperlipidemia   . Coronary artery disease     a. s/p MI tx with Cypher DES to pRCA in 4/04;  b. Echocardiogram 7/09: Normal LV function.  c. Nuclear study 3/13 no ischemia;  d. ETT 2/14 neg;  e. admx with CP => [facility] (01/30/2013):  pLAD 40-50%, oD1 70-80%, oOM1 40%, pRCA stent patent, mid RCA 30%, EF 60-65%. => med Rx.  . Stroke     a. 01/2013=> post cardiac cath CVA to L post communicating artery system; R sided weakness  . Hx of echocardiogram     a. Echocardiogram (01/31/2013): Mild focal basal and mild concentric hypertrophy of the septum, EF 50-55%, normal wall motion, grade 1 diastolic dysfunction, trivial AI, MAC, mild LAE, PASP 35  . Occlusion and stenosis of carotid artery without mention of cerebral infarction     40-59% on carotid doppler 2014; [place] (01/2013): R 1-39%, L 60-79%   Past Surgical History  Procedure Laterality Date  .  Coronary angioplasty with stent placement  07/14/2002    Stent to the right coronary artery  . Rotator cuff repair      for chronic rotator cuff injury  . Hernia repair  04/10/2008    scrotal hernia repair  . Endolymphatic shunt decompression  06/11/2009     Right endolymphatic sac decompression and shunt  placement   Current Outpatient Prescriptions on File Prior to Visit  Medication Sig Dispense Refill  . atorvastatin (LIPITOR) 80 MG tablet Take 1 tablet (80 mg total) by mouth daily.  90 tablet  3  . carbamazepine (CARBATROL) 300 MG 12 hr capsule Take 1 capsule (300 mg total) by mouth 2 (two) times daily.  60 capsule  5  . clopidogrel (PLAVIX) 75 MG tablet Take 75 mg by mouth daily with breakfast.      . diazepam (VALIUM) 2 MG tablet TAKE 1 TABLET BY MOUTH TWICE DAILY.  60 tablet  2  . isosorbide mononitrate (IMDUR) 30 MG 24 hr tablet TAKE 1 TABLET BY MOUTH EVERY DAY  30 tablet  5  . levothyroxine (SYNTHROID, LEVOTHROID) 25 MCG tablet TAKE 2 TABLETS BY MOUTH DAILY BEFORE BREAKFAST  60 tablet  11  . metFORMIN (GLUCOPHAGE) 500 MG tablet TAKE 1 TABLET BY MOUTH 2 TIMES DAILY WITH A MEAL.  60 tablet  5  . metoprolol (LOPRESSOR) 50 MG tablet TAKE 1 TABLET TWICE A DAY  60 tablet  11  . Multiple Vitamins-Minerals (MULTIVITAMINS THER. W/MINERALS) TABS Take 1 tablet by mouth at bedtime.        . nitroGLYCERIN (NITROSTAT) 0.4 MG SL tablet Place 1 tablet (0.4 mg total) under the tongue every 5 (five) minutes as needed for chest pain.  25 tablet  3  . pantoprazole (PROTONIX) 40 MG tablet TAKE 1 TABLET BY MOUTH EVERY DAY  30 tablet  11  . silodosin (RAPAFLO) 8 MG CAPS capsule Take 8 mg by mouth daily with breakfast.       . testosterone cypionate (DEPO-TESTOSTERONE) 200 MG/ML injection Inject 1 mL (200 mg total) into the muscle every 14 (fourteen) days.  10 mL  3  . losartan (COZAAR) 100 MG tablet TAKE 0.5 TABLETS (50 MG TOTAL) BY MOUTH 2 (TWO) TIMES DAILY.  30 tablet  6   Current Facility-Administered  Medications on File Prior to Visit  Medication Dose Route Frequency Provider Last Rate Last Dose  . testosterone cypionate (DEPOTESTOTERONE CYPIONATE) injection 200 mg  200 mg Intramuscular Q14 Days [name], MD   200 mg at 11/08/13 1628   No Known Allergies History   Social History  . Marital Status: Married    Spouse Name: N/A    Number of Children: 4  . Years of Education: 12th   Occupational History  . Retired    Social History Main Topics  . Smoking status: Former Smoker    Quit date: 08/19/1961  . Smokeless tobacco: Former Systems developer    Types: Chew  . Alcohol Use: No  . Drug Use: No  . Sexual Activity: No   Other Topics Concern  . Not on file   Social History Narrative   Lives with wife.      Review of Systems  All other systems reviewed and are negative.      Objective:   Physical Exam  Vitals reviewed. Cardiovascular: Normal rate, regular rhythm and normal heart sounds.   Pulmonary/Chest: Effort normal and breath sounds normal. No respiratory distress. He has no wheezes. He has no rales.  Abdominal: Soft. Bowel sounds are normal. He exhibits no distension. There is no tenderness. There is no rebound and no guarding.          Assessment & Plan:  Diarrhea - Plan: Gastrointestinal Pathogen Panel PCR, Clostridium difficile EIA   I certainly think a colonoscopy is a good idea to rule out inflammatory bowel disease as well as malignancy. I be glad to check his for GI pathogens as well as C. difficile colitis. However I explained to the patient that I believe an infection as an unlikely source given the chronicity of his diarrhea. I believe is likely due to the metformin he is taking. Patient to discontinue metformin and recheck with me in 2-3 weeks to see if the diarrhea has improved. If it has we will need to switch his diabetic medication. I also recommended that he resume his hydrochlorothiazide as well as his clonidine for his elevated blood pressure we  will recheck that in 2 weeks as well.

## 2013-12-14 ENCOUNTER — Telehealth: Payer: Self-pay | Admitting: *Deleted

## 2013-12-14 LAB — CLOSTRIDIUM DIFFICILE BY PCR: Toxigenic C. Difficile by PCR: NOT DETECTED

## 2013-12-14 NOTE — Telephone Encounter (Signed)
Patient verbalized understanding to holding Plavix today, five days before procedure.  Per Barb Merino., RN five days it okay to do.

## 2013-12-14 NOTE — Telephone Encounter (Signed)
Patient verbalized understanding to holding Plavix today, five days before procedure. Per Barb Merino., RN five days it okay to do.

## 2013-12-14 NOTE — Telephone Encounter (Signed)
Patient called back. Advised patient to stop Plavix today. Five days before the procedure per Barb Merino., RN it will be okay. Patient verbalized understanding.

## 2013-12-17 LAB — GASTROINTESTINAL PATHOGEN PANEL PCR
C. difficile Tox A/B, PCR: NEGATIVE
Campylobacter, PCR: NEGATIVE
Cryptosporidium, PCR: NEGATIVE
E coli (ETEC) LT/ST PCR: NEGATIVE
E coli (STEC) stx1/stx2, PCR: NEGATIVE
E coli 0157, PCR: NEGATIVE
Giardia lamblia, PCR: NEGATIVE
Norovirus, PCR: NEGATIVE
Rotavirus A, PCR: NEGATIVE
Salmonella, PCR: NEGATIVE
Shigella, PCR: NEGATIVE

## 2013-12-19 ENCOUNTER — Encounter: Payer: Self-pay | Admitting: Internal Medicine

## 2013-12-19 ENCOUNTER — Other Ambulatory Visit: Payer: Self-pay | Admitting: Family Medicine

## 2013-12-19 ENCOUNTER — Ambulatory Visit (AMBULATORY_SURGERY_CENTER): Payer: Medicare Other | Admitting: Internal Medicine

## 2013-12-19 VITALS — BP 122/75 | HR 67 | Temp 96.2°F | Resp 30 | Ht 66.25 in | Wt 226.0 lb

## 2013-12-19 DIAGNOSIS — K6389 Other specified diseases of intestine: Secondary | ICD-10-CM | POA: Diagnosis not present

## 2013-12-19 DIAGNOSIS — R197 Diarrhea, unspecified: Secondary | ICD-10-CM

## 2013-12-19 DIAGNOSIS — I2510 Atherosclerotic heart disease of native coronary artery without angina pectoris: Secondary | ICD-10-CM | POA: Diagnosis not present

## 2013-12-19 DIAGNOSIS — Z1211 Encounter for screening for malignant neoplasm of colon: Secondary | ICD-10-CM

## 2013-12-19 DIAGNOSIS — I252 Old myocardial infarction: Secondary | ICD-10-CM | POA: Diagnosis not present

## 2013-12-19 DIAGNOSIS — Z8673 Personal history of transient ischemic attack (TIA), and cerebral infarction without residual deficits: Secondary | ICD-10-CM | POA: Diagnosis not present

## 2013-12-19 LAB — GLUCOSE, CAPILLARY
Glucose-Capillary: 134 mg/dL — ABNORMAL HIGH (ref 70–99)
Glucose-Capillary: 118 mg/dL — ABNORMAL HIGH (ref 70–99)

## 2013-12-19 MED ORDER — COLESTIPOL HCL 1 G PO TABS: 1.0000 g | ORAL_TABLET | Freq: Two times a day (BID) | ORAL | Status: DC

## 2013-12-19 MED ORDER — SODIUM CHLORIDE 0.9 % IV SOLN: 500.0000 mL | INTRAVENOUS | Status: DC

## 2013-12-19 NOTE — Progress Notes (Signed)
A/ox3, pleased with MAC, report to RN 

## 2013-12-19 NOTE — Patient Instructions (Signed)
YOU HAD AN ENDOSCOPIC PROCEDURE TODAY AT THE [facility]: Refer to the procedure report that was given to you for any specific questions about what was found during the examination.  If the procedure report does not answer your questions, please call your gastroenterologist to clarify.  If you requested that your care partner not be given the details of your procedure findings, then the procedure report has been included in a sealed envelope for you to review at your convenience later.  YOU SHOULD EXPECT: Some feelings of bloating in the abdomen. Passage of more gas than usual.  Walking can help get rid of the air that was put into your GI tract during the procedure and reduce the bloating. If you had a lower endoscopy (such as a colonoscopy or flexible sigmoidoscopy) you may notice spotting of blood in your stool or on the toilet paper. If you underwent a bowel prep for your procedure, then you may not have a normal bowel movement for a few days.  DIET: Your first meal following the procedure should be a light meal and then it is ok to progress to your normal diet.  A half-sandwich or bowl of soup is an example of a good first meal.  Heavy or fried foods are harder to digest and may make you feel nauseous or bloated.  Likewise meals heavy in dairy and vegetables can cause extra gas to form and this can also increase the bloating.  Drink plenty of fluids but you should avoid alcoholic beverages for 24 hours.  ACTIVITY: Your care partner should take you home directly after the procedure.  You should plan to take it easy, moving slowly for the rest of the day.  You can resume normal activity the day after the procedure however you should NOT DRIVE or use heavy machinery for 24 hours (because of the sedation medicines used during the test).    SYMPTOMS TO REPORT IMMEDIATELY: A gastroenterologist can be reached at any hour.  During normal business hours, 8:30 AM to 5:00 PM Monday through Friday,  call [phone].  After hours and on weekends, please call the GI answering service at [phone] who will take a message and have the physician on call contact you.   Following lower endoscopy (colonoscopy or flexible sigmoidoscopy):  Excessive amounts of blood in the stool  Significant tenderness or worsening of abdominal pains  Swelling of the abdomen that is new, acute  Fever of 100F or higher    FOLLOW UP: If any biopsies were taken you will be contacted by phone or by letter within the next 1-3 weeks.  Call your gastroenterologist if you have not heard about the biopsies in 3 weeks.  Our staff will call the home number listed on your records the next business day following your procedure to check on you and address any questions or concerns that you may have at that time regarding the information given to you following your procedure. This is a courtesy call and so if there is no answer at the home number and we have not heard from you through the emergency physician on call, we will assume that you have returned to your regular daily activities without incident.  SIGNATURES/CONFIDENTIALITY: You and/or your care partner have signed paperwork which will be entered into your electronic medical record.  These signatures attest to the fact that that the information above on your After Visit Summary has been reviewed and is understood.  Full responsibility of the confidentiality   of this discharge information lies with you and/or your care-partner.  Await biopsy results.  Colestid 2 gm daily for diarrhea.  Resume Plavix today

## 2013-12-19 NOTE — Op Note (Signed)
[facility]  [address] [place], [id number]   COLONOSCOPY PROCEDURE REPORT  PATIENT: [name], [name]  MR#: [id number] BIRTHDATE: 09-05-1933 , 80  yrs. old GENDER: male ENDOSCOPIST: Lafayette Dragon, MD REFERRED EX:[id number] Dennard Schaumann, M.D. PROCEDURE DATE:  12/19/2013 PROCEDURE:   Colonoscopy with hot biopsy/bipolar First Screening Colonoscopy - Avg.  risk and is 50 yrs.  old or older - No.  Prior Negative Screening - Now for repeat screening. 10 or more years since last screening  History of Adenoma - Now for follow-up colonoscopy & has been > or = to 3 yrs.  N/A  Polyps Removed Today? No.  Polyps Removed Today? No.  Recommend repeat exam, <10 yrs? Polyps Removed Today? No.  Recommend repeat exam, <10 yrs? No. ASA CLASS:   Class III INDICATIONS:average risk for colon cancer and last colonoscopy February 2005 was a normal exam.  He has had recent onset of diarrhea.  C.  difficile negative.Marland Kitchen MEDICATIONS: Monitored anesthesia care and Propofol 130 mg IV  DESCRIPTION OF PROCEDURE:   After the risks benefits and alternatives of the procedure were thoroughly explained, informed consent was obtained.  The digital rectal exam revealed no abnormalities of the rectum.   The LB [id number]  endoscope was introduced through the anus and advanced to the cecum, which was identified by both the appendix and ileocecal valve. No adverse events experienced.   The quality of the prep was good, using MoviPrep  The instrument was then slowly withdrawn as the colon was fully examined.      COLON FINDINGS: Small internal Grade I hemorrhoids were found.mucosa of the descending transverse and descending colon appeared normal. Multiple random biopsies were obtained to rule out microscopic colitis  Retroflexed views revealed no abnormalities. The time to cecum=5 minutes 40 seconds.  Withdrawal time=6 minutes 00 seconds. The scope was withdrawn and the procedure  completed. COMPLICATIONS: There were no immediate complications.  ENDOSCOPIC IMPRESSION: Small internal Grade I hemorrhoids multiple random biopsies of the colon to rule out microscopic colitis  RECOMMENDATIONS: Await biopsy results resume Plavix Colestid 2 g daily for diarrhea  eSigned:  Lafayette Dragon, MD 12/19/2013 9:43 AM   cc:

## 2013-12-19 NOTE — Progress Notes (Signed)
Called to room to assist during endoscopic procedure.  Patient ID and intended procedure confirmed with present staff. Received instructions for my participation in the procedure from the performing physician.  

## 2013-12-19 NOTE — Telephone Encounter (Signed)
Medication refilled per protocol. 

## 2013-12-20 ENCOUNTER — Telehealth: Payer: Self-pay | Admitting: *Deleted

## 2013-12-20 NOTE — Telephone Encounter (Signed)
Name identifier, mail box full, follow-up

## 2013-12-24 ENCOUNTER — Encounter: Payer: Self-pay | Admitting: Internal Medicine

## 2013-12-31 ENCOUNTER — Other Ambulatory Visit: Payer: Self-pay | Admitting: Family Medicine

## 2014-01-01 ENCOUNTER — Ambulatory Visit (INDEPENDENT_AMBULATORY_CARE_PROVIDER_SITE_OTHER): Payer: Medicare Other | Admitting: *Deleted

## 2014-01-01 DIAGNOSIS — R7989 Other specified abnormal findings of blood chemistry: Secondary | ICD-10-CM

## 2014-01-01 DIAGNOSIS — Z23 Encounter for immunization: Secondary | ICD-10-CM

## 2014-01-01 DIAGNOSIS — E291 Testicular hypofunction: Secondary | ICD-10-CM | POA: Diagnosis not present

## 2014-01-01 MED ORDER — TESTOSTERONE CYPIONATE 200 MG/ML IM SOLN
200.0000 mg | Freq: Once | INTRAMUSCULAR | Status: AC
  Administered 2014-01-01: 200 mg via INTRAMUSCULAR

## 2014-01-01 NOTE — Telephone Encounter (Signed)
ok 

## 2014-01-01 NOTE — Telephone Encounter (Signed)
?   OK to Refill  

## 2014-01-11 ENCOUNTER — Other Ambulatory Visit: Payer: Self-pay | Admitting: Family Medicine

## 2014-01-16 DIAGNOSIS — H6123 Impacted cerumen, bilateral: Secondary | ICD-10-CM | POA: Diagnosis not present

## 2014-01-16 DIAGNOSIS — H9041 Sensorineural hearing loss, unilateral, right ear, with unrestricted hearing on the contralateral side: Secondary | ICD-10-CM | POA: Diagnosis not present

## 2014-01-16 DIAGNOSIS — H8101 Meniere's disease, right ear: Secondary | ICD-10-CM | POA: Diagnosis not present

## 2014-01-22 ENCOUNTER — Ambulatory Visit (INDEPENDENT_AMBULATORY_CARE_PROVIDER_SITE_OTHER): Payer: Medicare Other | Admitting: Family Medicine

## 2014-01-22 DIAGNOSIS — E291 Testicular hypofunction: Secondary | ICD-10-CM

## 2014-01-22 DIAGNOSIS — E349 Endocrine disorder, unspecified: Secondary | ICD-10-CM

## 2014-02-06 ENCOUNTER — Ambulatory Visit: Payer: Medicare Other | Admitting: Neurology

## 2014-02-11 ENCOUNTER — Encounter: Payer: Self-pay | Admitting: Neurology

## 2014-02-13 ENCOUNTER — Ambulatory Visit: Payer: Medicare Other | Admitting: Neurology

## 2014-02-28 ENCOUNTER — Encounter (HOSPITAL_COMMUNITY): Payer: Self-pay | Admitting: Cardiology

## 2014-03-01 ENCOUNTER — Other Ambulatory Visit: Payer: Self-pay | Admitting: Cardiology

## 2014-03-01 ENCOUNTER — Other Ambulatory Visit: Payer: Self-pay | Admitting: Family Medicine

## 2014-03-01 NOTE — Telephone Encounter (Signed)
Rx has been sent to the pharmacy electronically. ° °

## 2014-03-01 NOTE — Telephone Encounter (Signed)
Medication refilled per protocol. 

## 2014-04-02 DIAGNOSIS — L570 Actinic keratosis: Secondary | ICD-10-CM | POA: Diagnosis not present

## 2014-04-03 ENCOUNTER — Other Ambulatory Visit: Payer: Self-pay | Admitting: Family Medicine

## 2014-04-03 NOTE — Telephone Encounter (Signed)
LRF 01/01/14 #60 + 2  LOV 12/13/13  OK refill?

## 2014-04-04 NOTE — Telephone Encounter (Signed)
rx called in

## 2014-04-04 NOTE — Telephone Encounter (Signed)
Ok x 3  

## 2014-04-17 ENCOUNTER — Other Ambulatory Visit: Payer: Self-pay | Admitting: Family Medicine

## 2014-04-17 NOTE — Telephone Encounter (Signed)
Refill appropriate and filled per protocol. 

## 2014-04-21 ENCOUNTER — Other Ambulatory Visit: Payer: Self-pay | Admitting: Family Medicine

## 2014-04-22 ENCOUNTER — Other Ambulatory Visit: Payer: Self-pay | Admitting: Family Medicine

## 2014-05-24 ENCOUNTER — Encounter: Payer: Self-pay | Admitting: Family Medicine

## 2014-05-24 ENCOUNTER — Other Ambulatory Visit: Payer: Self-pay | Admitting: Family Medicine

## 2014-05-24 NOTE — Telephone Encounter (Signed)
Medication refill for one time only.  Patient needs to be seen.  Letter sent for patient to call and schedule 

## 2014-05-28 ENCOUNTER — Other Ambulatory Visit: Payer: Self-pay | Admitting: Family Medicine

## 2014-05-30 ENCOUNTER — Other Ambulatory Visit: Payer: Self-pay | Admitting: Family Medicine

## 2014-05-30 NOTE — Telephone Encounter (Signed)
Refill appropriate and filled per protocol. 

## 2014-06-12 ENCOUNTER — Other Ambulatory Visit: Payer: Self-pay | Admitting: Family Medicine

## 2014-06-12 NOTE — Telephone Encounter (Signed)
Medication refilled per protocol. 

## 2014-06-13 ENCOUNTER — Other Ambulatory Visit: Payer: Self-pay | Admitting: Family Medicine

## 2014-06-13 ENCOUNTER — Other Ambulatory Visit: Payer: Medicare Other

## 2014-06-13 DIAGNOSIS — Z79899 Other long term (current) drug therapy: Secondary | ICD-10-CM

## 2014-06-13 DIAGNOSIS — E785 Hyperlipidemia, unspecified: Secondary | ICD-10-CM

## 2014-06-13 DIAGNOSIS — E039 Hypothyroidism, unspecified: Secondary | ICD-10-CM | POA: Diagnosis not present

## 2014-06-13 DIAGNOSIS — I10 Essential (primary) hypertension: Secondary | ICD-10-CM

## 2014-06-13 DIAGNOSIS — R7309 Other abnormal glucose: Secondary | ICD-10-CM | POA: Diagnosis not present

## 2014-06-13 LAB — COMPLETE METABOLIC PANEL WITH GFR
ALT: 14 U/L (ref 0–53)
AST: 15 U/L (ref 0–37)
Albumin: 4 g/dL (ref 3.5–5.2)
Alkaline Phosphatase: 63 U/L (ref 39–117)
BUN: 20 mg/dL (ref 6–23)
CO2: 28 mEq/L (ref 19–32)
Calcium: 9.3 mg/dL (ref 8.4–10.5)
Chloride: 99 mEq/L (ref 96–112)
Creat: 0.99 mg/dL (ref 0.50–1.35)
GFR, Est African American: 84 mL/min
GFR, Est Non African American: 73 mL/min
Glucose, Bld: 123 mg/dL — ABNORMAL HIGH (ref 70–99)
Potassium: 4.9 mEq/L (ref 3.5–5.3)
Sodium: 139 mEq/L (ref 135–145)
Total Bilirubin: 0.4 mg/dL (ref 0.2–1.2)
Total Protein: 6.1 g/dL (ref 6.0–8.3)

## 2014-06-13 LAB — CBC WITH DIFFERENTIAL/PLATELET
Basophils Absolute: 0 10*3/uL (ref 0.0–0.1)
Basophils Relative: 0 % (ref 0–1)
Eosinophils Absolute: 0.4 10*3/uL (ref 0.0–0.7)
Eosinophils Relative: 5 % (ref 0–5)
HCT: 39.7 % (ref 39.0–52.0)
Hemoglobin: 13 g/dL (ref 13.0–17.0)
Lymphocytes Relative: 26 % (ref 12–46)
Lymphs Abs: 2.1 10*3/uL (ref 0.7–4.0)
MCH: 30.1 pg (ref 26.0–34.0)
MCHC: 32.7 g/dL (ref 30.0–36.0)
MCV: 91.9 fL (ref 78.0–100.0)
MPV: 9.7 fL (ref 8.6–12.4)
Monocytes Absolute: 0.7 10*3/uL (ref 0.1–1.0)
Monocytes Relative: 9 % (ref 3–12)
Neutro Abs: 4.9 10*3/uL (ref 1.7–7.7)
Neutrophils Relative %: 60 % (ref 43–77)
Platelets: 283 10*3/uL (ref 150–400)
RBC: 4.32 MIL/uL (ref 4.22–5.81)
RDW: 14.2 % (ref 11.5–15.5)
WBC: 8.2 10*3/uL (ref 4.0–10.5)

## 2014-06-13 LAB — LIPID PANEL
Cholesterol: 184 mg/dL (ref 0–200)
HDL: 57 mg/dL (ref >=40)
LDL Cholesterol: 107 mg/dL — ABNORMAL HIGH (ref 0–99)
Total CHOL/HDL Ratio: 3.2 Ratio
Triglycerides: 101 mg/dL (ref <150)
VLDL: 20 mg/dL (ref 0–40)

## 2014-06-13 LAB — TSH: TSH: 4.759 u[IU]/mL — ABNORMAL HIGH (ref 0.350–4.500)

## 2014-06-14 ENCOUNTER — Encounter: Payer: Self-pay | Admitting: Family Medicine

## 2014-06-14 ENCOUNTER — Ambulatory Visit (INDEPENDENT_AMBULATORY_CARE_PROVIDER_SITE_OTHER): Payer: Medicare Other | Admitting: Family Medicine

## 2014-06-14 VITALS — BP 130/68 | HR 64 | Temp 98.0°F | Resp 16 | Wt 224.0 lb

## 2014-06-14 DIAGNOSIS — E785 Hyperlipidemia, unspecified: Secondary | ICD-10-CM | POA: Diagnosis not present

## 2014-06-14 DIAGNOSIS — E119 Type 2 diabetes mellitus without complications: Secondary | ICD-10-CM | POA: Diagnosis not present

## 2014-06-14 DIAGNOSIS — I10 Essential (primary) hypertension: Secondary | ICD-10-CM | POA: Diagnosis not present

## 2014-06-14 DIAGNOSIS — Z8673 Personal history of transient ischemic attack (TIA), and cerebral infarction without residual deficits: Secondary | ICD-10-CM | POA: Diagnosis not present

## 2014-06-14 LAB — HEMOGLOBIN A1C
Hgb A1c MFr Bld: 6.7 % — ABNORMAL HIGH (ref <5.7)
Mean Plasma Glucose: 146 mg/dL — ABNORMAL HIGH (ref <117)

## 2014-06-14 MED ORDER — SILODOSIN 8 MG PO CAPS: 8.0000 mg | ORAL_CAPSULE | Freq: Every day | ORAL | Status: DC

## 2014-06-14 MED ORDER — ROSUVASTATIN CALCIUM 40 MG PO TABS: 40.0000 mg | ORAL_TABLET | Freq: Every day | ORAL | Status: DC

## 2014-06-14 NOTE — Progress Notes (Signed)
Subjective:    Patient ID: [name], male    DOB: [birth year], 81 y.o.   MRN: [id number]  HPI Patient is here today for her regular checkup. He does report a cough for the last 2 weeks. It is nonproductive. He denies shortness of breath. He denies fever. He denies hemoptysis. It usually occurs every year around the same time. He believes it may be allergies. He has not tried any over-the-counter allergy medication. His blood pressure today is well controlled 30/68. He denies any chest pain shortness of breath or dyspnea on exertion. Patient's hemoglobin A1c is pending. However he denies any polyuria, polydipsia, or blurred vision. Diabetic eye exam is up-to-date. Diabetic foot exam is performed today. His pneumonia vaccine is up-to-date. Patient's most recent lab work as listed below. Patient denies any myalgias or right upper quadrant pain but even on Lipitor 80 mg a day his LDL cholesterol is still 107. Given his history of stroke his goal LDL cholesterol is less than 70. Appointment on 06/13/2014  Component Date Value Ref Range Status  . Sodium 06/13/2014 139  135 - 145 mEq/L Final  . Potassium 06/13/2014 4.9  3.5 - 5.3 mEq/L Final  . Chloride 06/13/2014 99  96 - 112 mEq/L Final  . CO2 06/13/2014 28  19 - 32 mEq/L Final  . Glucose, Bld 06/13/2014 123* 70 - 99 mg/dL Final  . BUN 06/13/2014 20  6 - 23 mg/dL Final  . Creat 06/13/2014 0.99  0.50 - 1.35 mg/dL Final  . Total Bilirubin 06/13/2014 0.4  0.2 - 1.2 mg/dL Final  . Alkaline Phosphatase 06/13/2014 63  39 - 117 U/L Final  . AST 06/13/2014 15  0 - 37 U/L Final  . ALT 06/13/2014 14  0 - 53 U/L Final  . Total Protein 06/13/2014 6.1  6.0 - 8.3 g/dL Final  . Albumin 06/13/2014 4.0  3.5 - 5.2 g/dL Final  . Calcium 06/13/2014 9.3  8.4 - 10.5 mg/dL Final  . GFR, Est African American 06/13/2014 84   Final  . GFR, Est Non African American 06/13/2014 73   Final   Comment:   The estimated GFR is a calculation valid for adults (>=18 years  old) that uses the CKD-EPI algorithm to adjust for age and sex. It is   not to be used for children, pregnant women, hospitalized patients,    patients on dialysis, or with rapidly changing kidney function. According to the NKDEP, eGFR >89 is normal, 60-89 shows mild impairment, 30-59 shows moderate impairment, 15-29 shows severe impairment and <15 is ESRD.     . TSH 06/13/2014 4.759* 0.350 - 4.500 uIU/mL Final  . Cholesterol 06/13/2014 184  0 - 200 mg/dL Final   Comment: ATP III Classification:       < 200        mg/dL        Desirable      200 - 239     mg/dL        Borderline High      >= 240        mg/dL        High     . Triglycerides 06/13/2014 101  <150 mg/dL Final  . HDL 06/13/2014 57  >=40 mg/dL Final   ** Please note change in reference range(s). **  . Total CHOL/HDL Ratio 06/13/2014 3.2   Final  . VLDL 06/13/2014 20  0 - 40 mg/dL Final  . LDL Cholesterol 06/13/2014 107* 0 -  99 mg/dL Final   Comment:   Total Cholesterol/HDL Ratio:CHD Risk                        Coronary Heart Disease Risk Table                                        Men       Women          1/2 Average Risk              3.4        3.3              Average Risk              5.0        4.4           2X Average Risk              9.6        7.1           3X Average Risk             23.4       11.0 Use the calculated Patient Ratio above and the CHD Risk table  to determine the patient's CHD Risk. ATP III Classification (LDL):       < 100        mg/dL         Optimal      100 - 129     mg/dL         Near or Above Optimal      130 - 159     mg/dL         Borderline High      160 - 189     mg/dL         High       > 190        mg/dL         Very High     . WBC 06/13/2014 8.2  4.0 - 10.5 K/uL Final  . RBC 06/13/2014 4.32  4.22 - 5.81 MIL/uL Final  . Hemoglobin 06/13/2014 13.0  13.0 - 17.0 g/dL Final  . HCT 06/13/2014 39.7  39.0 - 52.0 % Final  . MCV 06/13/2014 91.9  78.0 - 100.0 fL Final  . MCH  06/13/2014 30.1  26.0 - 34.0 pg Final  . MCHC 06/13/2014 32.7  30.0 - 36.0 g/dL Final  . RDW 06/13/2014 14.2  11.5 - 15.5 % Final  . Platelets 06/13/2014 283  150 - 400 K/uL Final  . MPV 06/13/2014 9.7  8.6 - 12.4 fL Final  . Neutrophils Relative % 06/13/2014 60  43 - 77 % Final  . Neutro Abs 06/13/2014 4.9  1.7 - 7.7 K/uL Final  . Lymphocytes Relative 06/13/2014 26  12 - 46 % Final  . Lymphs Abs 06/13/2014 2.1  0.7 - 4.0 K/uL Final  . Monocytes Relative 06/13/2014 9  3 - 12 % Final  . Monocytes Absolute 06/13/2014 0.7  0.1 - 1.0 K/uL Final  . Eosinophils Relative 06/13/2014 5  0 - 5 % Final  . Eosinophils Absolute 06/13/2014 0.4  0.0 - 0.7 K/uL Final  . Basophils Relative 06/13/2014 0  0 - 1 % Final  . Basophils Absolute 06/13/2014 0.0  0.0 -  0.1 K/uL Final  . Smear Review 06/13/2014 Criteria for review not met   Final   Past Medical History  Diagnosis Date  . Dyslipidemia   . Seizure disorder   . Hypothyroidism   . DDD (degenerative disc disease)   . Hx MRSA infection     left buttocks abscess  . Rotator cuff injury     chronic rotator cuff injury status post repair  . Hypertension   . Syncope   . Meniere's disease     Status post shunt  . BPH (benign prostatic hyperplasia)   . Diabetes mellitus without complication   . Hyperlipidemia   . Coronary artery disease     a. s/p MI tx with Cypher DES to pRCA in 4/04;  b. Echocardiogram 7/09: Normal LV function.  c. Nuclear study 3/13 no ischemia;  d. ETT 2/14 neg;  e. admx with CP => LHC (01/30/2013):  pLAD 40-50%, oD1 70-80%, oOM1 40%, pRCA stent patent, mid RCA 30%, EF 60-65%. => med Rx.  . Stroke     a. 01/2013=> post cardiac cath CVA to L post communicating artery system; R sided weakness  . Hx of echocardiogram     a. Echocardiogram (01/31/2013): Mild focal basal and mild concentric hypertrophy of the septum, EF 50-55%, normal wall motion, grade 1 diastolic dysfunction, trivial AI, MAC, mild LAE, PASP 35  . Occlusion and  stenosis of carotid artery without mention of cerebral infarction     40-59% on carotid doppler 2014; [place] (01/2013): R 1-39%, L 60-79%  . Myocardial infarction    Past Surgical History  Procedure Laterality Date  . Coronary angioplasty with stent placement  07/14/2002    Stent to the right coronary artery  . Rotator cuff repair      for chronic rotator cuff injury  . Hernia repair  04/10/2008    scrotal hernia repair  . Endolymphatic shunt decompression  06/11/2009     Right endolymphatic sac decompression and shunt  placement  . Colonoscopy    . Left heart catheterization with coronary angiogram N/A 01/30/2013    Procedure: LEFT HEART CATHETERIZATION WITH CORONARY ANGIOGRAM;  Surgeon: Larey Dresser, MD;  Location: [facility] CATH LAB;  Service: Cardiovascular;  Laterality: N/A;   Current Outpatient Prescriptions on File Prior to Visit  Medication Sig Dispense Refill  . atorvastatin (LIPITOR) 80 MG tablet TAKE 1 TABLET BY MOUTH EVERY DAY 90 tablet 3  . carbamazepine (CARBATROL) 300 MG 12 hr capsule TAKE ONE CAPSULE BY MOUTH TWICE A DAY 60 capsule 5  . cloNIDine (CATAPRES) 0.1 MG tablet     . clopidogrel (PLAVIX) 75 MG tablet TAKE 1 TABLET BY MOUTH EVERY DAY 30 tablet 5  . colestipol (COLESTID) 1 G tablet Take 1 tablet (1 g total) by mouth 2 (two) times daily. 60 tablet 1  . diazepam (VALIUM) 2 MG tablet TAKE 1 TABLET TWICE A DAY AS NEEDED -- (REFILLS MUST BE 30 DAYS APART) 60 tablet 2  . hydrochlorothiazide (HYDRODIURIL) 25 MG tablet     . isosorbide mononitrate (IMDUR) 30 MG 24 hr tablet TAKE 1 TABLET BY MOUTH EVERY DAY 30 tablet 5  . isosorbide mononitrate (IMDUR) 30 MG 24 hr tablet TAKE 1 TABLET BY MOUTH EVERY DAY 30 tablet 5  . levothyroxine (SYNTHROID, LEVOTHROID) 25 MCG tablet TAKE 2 TABLETS BY MOUTH DAILY BEFORE BREAKFAST 30 tablet 11  . losartan (COZAAR) 100 MG tablet TAKE 1/2 TABLET BY MOUTH 2 TIMES A DAY 30 tablet 0  . losartan (  COZAAR) 100 MG tablet TAKE 1/2 TABLET BY MOUTH 2 TIMES  A DAY 30 tablet 11  . metFORMIN (GLUCOPHAGE) 500 MG tablet TAKE 1 TABLET BY MOUTH 2 TIMES DAILY WITH A MEAL. 60 tablet 11  . metoprolol (LOPRESSOR) 50 MG tablet TAKE 1 TABLET BY MOUTH TWICE A DAY 60 tablet 11  . Multiple Vitamins-Minerals (MULTIVITAMINS THER. W/MINERALS) TABS Take 1 tablet by mouth at bedtime.      Marland Kitchen NITROSTAT 0.4 MG SL tablet PLACE 1 TABLET UNDER TONGUE EVERY 5 MINS AS NEEDED FOR CHEST PAIN 25 tablet 0  . pantoprazole (PROTONIX) 40 MG tablet TAKE 1 TABLET BY MOUTH EVERY DAY 30 tablet 11   Current Facility-Administered Medications on File Prior to Visit  Medication Dose Route Frequency Provider Last Rate Last Dose  . testosterone cypionate (DEPOTESTOTERONE CYPIONATE) injection 200 mg  200 mg Intramuscular Q14 Days [name], MD   200 mg at 01/22/14 1244   No Known Allergies History   Social History  . Marital Status: Married    Spouse Name: N/A  . Number of Children: 4  . Years of Education: 12th   Occupational History  . Retired    Social History Main Topics  . Smoking status: Former Smoker    Quit date: 08/19/1961  . Smokeless tobacco: Former Systems developer    Types: Chew  . Alcohol Use: No  . Drug Use: No  . Sexual Activity: No   Other Topics Concern  . Not on file   Social History Narrative   Lives with wife.      Review of Systems  All other systems reviewed and are negative.      Objective:   Physical Exam  Constitutional: He appears well-developed and well-nourished. No distress.  Neck: Neck supple. No JVD present. No thyromegaly present.  Cardiovascular: Normal rate, regular rhythm, normal heart sounds and intact distal pulses.   No murmur heard. Pulmonary/Chest: Effort normal and breath sounds normal. No respiratory distress. He has no wheezes. He has no rales.  Abdominal: Soft. Bowel sounds are normal. He exhibits no distension and no mass. There is no tenderness. There is no rebound and no guarding.  Musculoskeletal: He exhibits no edema.   Lymphadenopathy:    He has no cervical adenopathy.  Skin: Skin is warm. No rash noted. He is not diaphoretic. No erythema. No pallor.  Vitals reviewed.         Assessment & Plan:  HLD (hyperlipidemia) - Plan: rosuvastatin (CRESTOR) 40 MG tablet  Essential hypertension  History of CVA (cerebrovascular accident)  Diabetes mellitus type II, controlled  Blood pressures well controlled. I asked the patient to discontinue Lipitor and start Crestor 40 mg a day. Also recommended he start fish oil 2 g a day. Recheck fasting lipid panel in 3 months. Goal LDL cholesterol is less than 70. I will add a hemoglobin A1c. Goal hemoglobin A1c is less than 6.5. I do believe his cough is due to allergy and I recommended he start Claritin 10 mg by mouth daily.

## 2014-06-18 ENCOUNTER — Encounter: Payer: Self-pay | Admitting: Family Medicine

## 2014-06-20 ENCOUNTER — Other Ambulatory Visit: Payer: Self-pay | Admitting: Family Medicine

## 2014-06-21 NOTE — Telephone Encounter (Signed)
Medication refilled per protocol. 

## 2014-07-05 ENCOUNTER — Other Ambulatory Visit: Payer: Self-pay | Admitting: Family Medicine

## 2014-07-05 NOTE — Telephone Encounter (Signed)
LRF 04/04/14 #60 + 2  LOV 06/14/14  OK refill?

## 2014-07-08 NOTE — Telephone Encounter (Signed)
ok 

## 2014-07-08 NOTE — Telephone Encounter (Signed)
rx called in

## 2014-07-31 ENCOUNTER — Encounter: Payer: Self-pay | Admitting: Family Medicine

## 2014-07-31 ENCOUNTER — Ambulatory Visit (INDEPENDENT_AMBULATORY_CARE_PROVIDER_SITE_OTHER): Payer: Medicare Other | Admitting: Family Medicine

## 2014-07-31 VITALS — BP 140/88 | HR 86 | Temp 98.2°F | Resp 18 | Ht 67.0 in | Wt 225.0 lb

## 2014-07-31 DIAGNOSIS — Z23 Encounter for immunization: Secondary | ICD-10-CM | POA: Diagnosis not present

## 2014-07-31 DIAGNOSIS — S41111A Laceration without foreign body of right upper arm, initial encounter: Secondary | ICD-10-CM

## 2014-07-31 NOTE — Progress Notes (Signed)
Patient ID: [name], male   DOB: [birth year], 81 y.o.   MRN: [id number] Pt here with skin tear to right arm, actually directly over a chronic sebaceous cyst. No pain, he injured his arm on the metal screen door today. He is on Plavix secondary to heart attack and stroke. On review of chart I do not see an updated tetanus booster.  He has jagged skin tear about 3 cm long mild bleeding from the area. It was clean at the bedside Tegaderm was placed he has had multiple skin tears in the past he is aware the care for them. He will follow-up on Monday to have the bandage removed a recheck. We did give him a TDAP

## 2014-07-31 NOTE — Patient Instructions (Signed)
Keep bandage in tact until Monday Call for any signs of infection- redness, pus, or pain Tetanus booster given  F/U Monday for recheck on Nurse schedule

## 2014-08-05 ENCOUNTER — Ambulatory Visit: Payer: Medicare Other | Admitting: Family Medicine

## 2014-08-05 DIAGNOSIS — S51811D Laceration without foreign body of right forearm, subsequent encounter: Secondary | ICD-10-CM

## 2014-08-05 NOTE — Progress Notes (Signed)
Pt came to office for recheck of wound on upper right forearm.  Wound clean with no sign of infection.  Appears to be healing well.  Dr Dennard Schaumann also inspected wound.  Recommended daily dressing change at home with antibiotic ointment, telfa pad and coban wrap.  Instructed patient on wound care.  gave him small quantity of supplies for one week.  After that no dressing should be needed.  Instructed to apply only small amt of ointment.  If wound worsens, becomes red,hot or starts to have drainage, return to office.

## 2014-08-06 ENCOUNTER — Other Ambulatory Visit: Payer: Self-pay | Admitting: Family Medicine

## 2014-08-06 NOTE — Telephone Encounter (Signed)
Medication refill per protocol °

## 2014-09-02 ENCOUNTER — Other Ambulatory Visit: Payer: Self-pay | Admitting: Family Medicine

## 2014-10-02 DIAGNOSIS — N400 Enlarged prostate without lower urinary tract symptoms: Secondary | ICD-10-CM | POA: Diagnosis not present

## 2014-10-02 DIAGNOSIS — R351 Nocturia: Secondary | ICD-10-CM | POA: Diagnosis not present

## 2014-10-02 DIAGNOSIS — R972 Elevated prostate specific antigen [PSA]: Secondary | ICD-10-CM | POA: Diagnosis not present

## 2014-10-07 ENCOUNTER — Other Ambulatory Visit: Payer: Self-pay | Admitting: Family Medicine

## 2014-10-07 NOTE — Telephone Encounter (Signed)
Medication called to pharmacy. 

## 2014-10-07 NOTE — Telephone Encounter (Signed)
ok 

## 2014-10-07 NOTE — Telephone Encounter (Signed)
Ok to refill??  Last office visit 07/31/2014.  Last refill 07/08/2014, #2 refills.

## 2014-10-21 ENCOUNTER — Other Ambulatory Visit: Payer: Self-pay | Admitting: Family Medicine

## 2014-10-21 ENCOUNTER — Encounter: Payer: Self-pay | Admitting: Family Medicine

## 2014-10-21 NOTE — Telephone Encounter (Signed)
Medication refilled per protocol. 

## 2014-10-21 NOTE — Telephone Encounter (Signed)
Medication refill for one time only.  Patient needs to be seen.  Letter sent for patient to call and schedule 

## 2014-11-15 ENCOUNTER — Encounter: Payer: Self-pay | Admitting: Family Medicine

## 2014-11-15 ENCOUNTER — Ambulatory Visit (INDEPENDENT_AMBULATORY_CARE_PROVIDER_SITE_OTHER): Payer: Medicare Other | Admitting: Family Medicine

## 2014-11-15 VITALS — BP 118/68 | HR 78 | Temp 98.3°F | Resp 16 | Ht 65.0 in | Wt 225.0 lb

## 2014-11-15 DIAGNOSIS — E038 Other specified hypothyroidism: Secondary | ICD-10-CM

## 2014-11-15 DIAGNOSIS — E119 Type 2 diabetes mellitus without complications: Secondary | ICD-10-CM

## 2014-11-15 LAB — LIPID PANEL
Cholesterol: 169 mg/dL (ref 125–200)
HDL: 50 mg/dL (ref >=40)
LDL Cholesterol: 91 mg/dL (ref <130)
Total CHOL/HDL Ratio: 3.4 Ratio (ref <=5.0)
Triglycerides: 139 mg/dL (ref <150)
VLDL: 28 mg/dL (ref <30)

## 2014-11-15 LAB — TSH: TSH: 3.613 u[IU]/mL (ref 0.350–4.500)

## 2014-11-15 LAB — COMPLETE METABOLIC PANEL WITH GFR
ALT: 14 U/L (ref 9–46)
AST: 14 U/L (ref 10–35)
Albumin: 4.1 g/dL (ref 3.6–5.1)
Alkaline Phosphatase: 65 U/L (ref 40–115)
BUN: 16 mg/dL (ref 7–25)
CO2: 26 mmol/L (ref 20–31)
Calcium: 8.6 mg/dL (ref 8.6–10.3)
Chloride: 100 mmol/L (ref 98–110)
Creat: 0.94 mg/dL (ref 0.70–1.18)
GFR, Est African American: 89 mL/min (ref >=60)
GFR, Est Non African American: 77 mL/min (ref >=60)
Glucose, Bld: 133 mg/dL — ABNORMAL HIGH (ref 70–99)
Potassium: 3.8 mmol/L (ref 3.5–5.3)
Sodium: 139 mmol/L (ref 135–146)
Total Bilirubin: 0.3 mg/dL (ref 0.2–1.2)
Total Protein: 5.8 g/dL — ABNORMAL LOW (ref 6.1–8.1)

## 2014-11-15 LAB — HEMOGLOBIN A1C
Hgb A1c MFr Bld: 6.6 % — ABNORMAL HIGH (ref <5.7)
Mean Plasma Glucose: 143 mg/dL — ABNORMAL HIGH (ref <117)

## 2014-11-15 NOTE — Progress Notes (Signed)
Subjective:    Patient ID: [name], male    DOB: 1933/11/21, 81 y.o.   MRN: [id number]  HPI Patient is here today for follow-up. He has a past medical history of diabetes mellitus type 2, hypertension, hyperlipidemia. He also has a history of hypothyroidism and stroke.  Stroke occurred while the patient was taking aspirin. Therefore he is on Plavix for secondary prevention of stroke. He denies any  Chest pain shortness of breath or dyspnea on exertion. He denies any cough or shortness of breath. He denies any abdominal pain or weight loss or blood in his stool. Although he does continue have occasional intermittent diarrhea. His blood pressures well controlled today. He is due for fasting lab work Past Medical History  Diagnosis Date  . Dyslipidemia   . Seizure disorder   . Hypothyroidism   . DDD (degenerative disc disease)   . Hx MRSA infection     left buttocks abscess  . Rotator cuff injury     chronic rotator cuff injury status post repair  . Hypertension   . Syncope   . Meniere's disease     Status post shunt  . BPH (benign prostatic hyperplasia)   . Diabetes mellitus without complication   . Hyperlipidemia   . Coronary artery disease     a. s/p MI tx with Cypher DES to pRCA in 4/04;  b. Echocardiogram 7/09: Normal LV function.  c. Nuclear study 3/13 no ischemia;  d. ETT 2/14 neg;  e. admx with CP => LHC (01/30/2013):  pLAD 40-50%, oD1 70-80%, oOM1 40%, pRCA stent patent, mid RCA 30%, EF 60-65%. => med Rx.  . Stroke     a. 01/2013=> post cardiac cath CVA to L post communicating artery system; R sided weakness  . Hx of echocardiogram     a. Echocardiogram (01/31/2013): Mild focal basal and mild concentric hypertrophy of the septum, EF 50-55%, normal wall motion, grade 1 diastolic dysfunction, trivial AI, MAC, mild LAE, PASP 35  . Occlusion and stenosis of carotid artery without mention of cerebral infarction     40-59% on carotid doppler 2014; [place] (01/2013): R 1-39%, L  60-79%  . Myocardial infarction    Past Surgical History  Procedure Laterality Date  . Coronary angioplasty with stent placement  07/14/2002    Stent to the right coronary artery  . Rotator cuff repair      for chronic rotator cuff injury  . Hernia repair  04/10/2008    scrotal hernia repair  . Endolymphatic shunt decompression  06/11/2009     Right endolymphatic sac decompression and shunt  placement  . Colonoscopy    . Left heart catheterization with coronary angiogram N/A 01/30/2013    Procedure: LEFT HEART CATHETERIZATION WITH CORONARY ANGIOGRAM;  Surgeon: Larey Dresser, MD;  Location: [facility] CATH LAB;  Service: Cardiovascular;  Laterality: N/A;   Current Outpatient Prescriptions on File Prior to Visit  Medication Sig Dispense Refill  . atorvastatin (LIPITOR) 80 MG tablet TAKE 1 TABLET BY MOUTH EVERY DAY 90 tablet 3  . carbamazepine (CARBATROL) 300 MG 12 hr capsule TAKE ONE CAPSULE BY MOUTH TWICE A DAY 60 capsule 5  . cloNIDine (CATAPRES) 0.1 MG tablet TAKE 1 TABLET BY MOUTH TWICE A DAY 60 tablet 4  . clopidogrel (PLAVIX) 75 MG tablet TAKE 1 TABLET BY MOUTH EVERY DAY 30 tablet 5  . colestipol (COLESTID) 1 G tablet Take 1 tablet (1 g total) by mouth 2 (two) times daily. 60 tablet 1  .  diazepam (VALIUM) 2 MG tablet TAKE 1 TABLET TWICE A DAY AS NEEDED 60 tablet 2  . hydrochlorothiazide (HYDRODIURIL) 25 MG tablet     . hydrochlorothiazide (HYDRODIURIL) 25 MG tablet TAKE 1 TABLET EVERY DAY 30 tablet 9  . isosorbide mononitrate (IMDUR) 30 MG 24 hr tablet TAKE 1 TABLET BY MOUTH EVERY DAY 90 tablet 0  . levothyroxine (SYNTHROID, LEVOTHROID) 25 MCG tablet TAKE 2 TABLETS BY MOUTH DAILY BEFORE BREAKFAST 30 tablet 11  . losartan (COZAAR) 100 MG tablet TAKE 1/2 TABLET BY MOUTH 2 TIMES A DAY 30 tablet 0  . losartan (COZAAR) 100 MG tablet TAKE 1/2 TABLET BY MOUTH 2 TIMES A DAY 30 tablet 11  . metFORMIN (GLUCOPHAGE) 500 MG tablet TAKE 1 TABLET BY MOUTH 2 TIMES DAILY WITH A MEAL. 60 tablet 11  .  metoprolol (LOPRESSOR) 50 MG tablet TAKE 1 TABLET BY MOUTH TWICE A DAY 60 tablet 11  . Multiple Vitamins-Minerals (MULTIVITAMINS THER. W/MINERALS) TABS Take 1 tablet by mouth at bedtime.      Marland Kitchen NITROSTAT 0.4 MG SL tablet PLACE 1 TABLET UNDER TONGUE EVERY 5 MINS AS NEEDED FOR CHEST PAIN 25 tablet 0  . pantoprazole (PROTONIX) 40 MG tablet TAKE 1 TABLET BY MOUTH EVERY DAY 90 tablet 0  . rosuvastatin (CRESTOR) 40 MG tablet Take 1 tablet (40 mg total) by mouth daily. 90 tablet 3  . silodosin (RAPAFLO) 8 MG CAPS capsule Take 1 capsule (8 mg total) by mouth daily with breakfast. 30 capsule 5   Current Facility-Administered Medications on File Prior to Visit  Medication Dose Route Frequency Provider Last Rate Last Dose  . testosterone cypionate (DEPOTESTOTERONE CYPIONATE) injection 200 mg  200 mg Intramuscular Q14 Days [name], MD   200 mg at 01/22/14 1244   No Known Allergies Social History   Social History  . Marital Status: Married    Spouse Name: N/A  . Number of Children: 4  . Years of Education: 12th   Occupational History  . Retired    Social History Main Topics  . Smoking status: Former Smoker    Quit date: 08/19/1961  . Smokeless tobacco: Former Systems developer    Types: Chew  . Alcohol Use: No  . Drug Use: No  . Sexual Activity: No   Other Topics Concern  . Not on file   Social History Narrative   Lives with wife.      Review of Systems  All other systems reviewed and are negative.      Objective:   Physical Exam  Constitutional: He appears well-developed and well-nourished.  Neck: Neck supple. No JVD present. No thyromegaly present.  Cardiovascular: Normal rate, regular rhythm and normal heart sounds.   No murmur heard. Pulmonary/Chest: Effort normal and breath sounds normal. No respiratory distress. He has no wheezes. He has no rales. He exhibits no tenderness.  Abdominal: Soft. Bowel sounds are normal. He exhibits no distension. There is no tenderness. There is  no rebound and no guarding.  Musculoskeletal: He exhibits no edema.  Lymphadenopathy:    He has no cervical adenopathy.  Vitals reviewed.         Assessment & Plan:  Diabetes mellitus type II, controlled - Plan: COMPLETE METABOLIC PANEL WITH GFR, Hemoglobin A1c, Lipid panel, Microalbumin, urine  Other specified hypothyroidism - Plan: TSH  Blood pressures well controlled. I will check a hemoglobin A1c. Goal hemoglobin A1c is less than 7. Goal LDL cholesterol is less than 70. I will also check a urine  microalbumin and check the patient's TSH. If the patient's LDL cholesterol is greater than 70, I would recommend  Adding zetia to crestor.  I would also consider stopping metformin to see if the patient's diarrhea improves and replace it with Januvia

## 2014-11-16 LAB — MICROALBUMIN, URINE: Microalb, Ur: 0.5 mg/dL (ref <2.0)

## 2014-11-18 ENCOUNTER — Encounter: Payer: Self-pay | Admitting: Family Medicine

## 2014-12-03 ENCOUNTER — Other Ambulatory Visit: Payer: Self-pay | Admitting: Family Medicine

## 2014-12-27 ENCOUNTER — Other Ambulatory Visit: Payer: Self-pay | Admitting: Family Medicine

## 2014-12-27 NOTE — Telephone Encounter (Signed)
Medication refilled per protocol. 

## 2015-01-07 ENCOUNTER — Ambulatory Visit: Payer: Medicare Other

## 2015-01-12 ENCOUNTER — Other Ambulatory Visit: Payer: Self-pay | Admitting: Family Medicine

## 2015-01-14 NOTE — Telephone Encounter (Signed)
Medication refilled per protocol. 

## 2015-01-15 ENCOUNTER — Other Ambulatory Visit: Payer: Self-pay | Admitting: Family Medicine

## 2015-01-15 ENCOUNTER — Ambulatory Visit (INDEPENDENT_AMBULATORY_CARE_PROVIDER_SITE_OTHER): Payer: Medicare Other | Admitting: Family Medicine

## 2015-01-15 DIAGNOSIS — Z23 Encounter for immunization: Secondary | ICD-10-CM

## 2015-01-16 NOTE — Telephone Encounter (Signed)
ok 

## 2015-01-16 NOTE — Telephone Encounter (Signed)
Ok to refill??  Last office visit 12/16/2014.  Last refill 10/07/2014, #2 refills.

## 2015-01-16 NOTE — Telephone Encounter (Signed)
Refill appropriate and filled per protocol. 

## 2015-01-24 ENCOUNTER — Other Ambulatory Visit: Payer: Self-pay | Admitting: Family Medicine

## 2015-01-24 NOTE — Telephone Encounter (Signed)
Medication refilled per protocol. 

## 2015-01-27 ENCOUNTER — Other Ambulatory Visit: Payer: Self-pay | Admitting: Family Medicine

## 2015-02-27 ENCOUNTER — Encounter: Payer: Self-pay | Admitting: Family Medicine

## 2015-02-27 ENCOUNTER — Ambulatory Visit (INDEPENDENT_AMBULATORY_CARE_PROVIDER_SITE_OTHER): Payer: Medicare Other | Admitting: Family Medicine

## 2015-02-27 VITALS — BP 128/70 | HR 76 | Temp 98.1°F | Resp 18 | Ht 65.0 in | Wt 224.0 lb

## 2015-02-27 DIAGNOSIS — J069 Acute upper respiratory infection, unspecified: Secondary | ICD-10-CM

## 2015-02-27 DIAGNOSIS — B9789 Other viral agents as the cause of diseases classified elsewhere: Principal | ICD-10-CM

## 2015-02-27 MED ORDER — AMOXICILLIN 875 MG PO TABS: 875.0000 mg | ORAL_TABLET | Freq: Two times a day (BID) | ORAL | Status: DC

## 2015-02-27 NOTE — Progress Notes (Signed)
Subjective:    Patient ID: [name], male    DOB: [birth year], 81 y.o.   MRN: [id number]  HPI  Symptoms began Saturday. Patient developed head congestion, rhinorrhea, postnasal drip, sore scratchy throat, and nonproductive cough. He denies any fevers or chills. He denies any sinus pain. He does report head and chest congestion. He is been taking Coricidin with minimal relief. Past Medical History  Diagnosis Date  . Dyslipidemia   . Seizure disorder ([place])   . Hypothyroidism   . DDD (degenerative disc disease)   . Hx MRSA infection     left buttocks abscess  . Rotator cuff injury     chronic rotator cuff injury status post repair  . Hypertension   . Syncope   . Meniere's disease     Status post shunt  . BPH (benign prostatic hyperplasia)   . Diabetes mellitus without complication ([place])   . Hyperlipidemia   . Coronary artery disease     a. s/p MI tx with Cypher DES to pRCA in 4/04;  b. Echocardiogram 7/09: Normal LV function.  c. Nuclear study 3/13 no ischemia;  d. ETT 2/14 neg;  e. admx with CP => LHC (01/30/2013):  pLAD 40-50%, oD1 70-80%, oOM1 40%, pRCA stent patent, mid RCA 30%, EF 60-65%. => med Rx.  . Stroke [facility])     a. 01/2013=> post cardiac cath CVA to L post communicating artery system; R sided weakness  . Hx of echocardiogram     a. Echocardiogram (01/31/2013): Mild focal basal and mild concentric hypertrophy of the septum, EF 50-55%, normal wall motion, grade 1 diastolic dysfunction, trivial AI, MAC, mild LAE, PASP 35  . Occlusion and stenosis of carotid artery without mention of cerebral infarction     40-59% on carotid doppler 2014; [place] (01/2013): R 1-39%, L 60-79%  . Myocardial infarction [facility])    Past Surgical History  Procedure Laterality Date  . Coronary angioplasty with stent placement  07/14/2002    Stent to the right coronary artery  . Rotator cuff repair      for chronic rotator cuff injury  . Hernia repair  04/10/2008    scrotal hernia repair  .  Endolymphatic shunt decompression  06/11/2009     Right endolymphatic sac decompression and shunt  placement  . Colonoscopy    . Left heart catheterization with coronary angiogram N/A 01/30/2013    Procedure: LEFT HEART CATHETERIZATION WITH CORONARY ANGIOGRAM;  Surgeon: Larey Dresser, MD;  Location: [facility] CATH LAB;  Service: Cardiovascular;  Laterality: N/A;   Current Outpatient Prescriptions on File Prior to Visit  Medication Sig Dispense Refill  . carbamazepine (CARBATROL) 300 MG 12 hr capsule TAKE ONE CAPSULE BY MOUTH TWICE A DAY 60 capsule 5  . cloNIDine (CATAPRES) 0.1 MG tablet TAKE 1 TABLET BY MOUTH TWICE A DAY 60 tablet 4  . clopidogrel (PLAVIX) 75 MG tablet TAKE 1 TABLET BY MOUTH EVERY DAY 30 tablet 5  . colestipol (COLESTID) 1 G tablet Take 1 tablet (1 g total) by mouth 2 (two) times daily. 60 tablet 1  . diazepam (VALIUM) 2 MG tablet TAKE 1 TABLET BY MOUTH TWICE A DAY AS NEEDED FOR ANXIETY 60 tablet 2  . hydrochlorothiazide (HYDRODIURIL) 25 MG tablet Take 25 mg by mouth daily.     . hydrochlorothiazide (HYDRODIURIL) 25 MG tablet TAKE 1 TABLET EVERY DAY 30 tablet 9  . isosorbide mononitrate (IMDUR) 30 MG 24 hr tablet TAKE 1 TABLET BY MOUTH EVERY DAY 90 tablet  1  . levothyroxine (SYNTHROID, LEVOTHROID) 25 MCG tablet TAKE 2 TABLETS BY MOUTH DAILY BEFORE BREAKFAST 30 tablet 11  . losartan (COZAAR) 100 MG tablet TAKE 1/2 TABLET BY MOUTH 2 TIMES A DAY 30 tablet 0  . metFORMIN (GLUCOPHAGE) 500 MG tablet TAKE 1 TABLET TWICE A DAY WITH A MEAL 60 tablet 10  . metoprolol (LOPRESSOR) 50 MG tablet TAKE 1 TABLET BY MOUTH TWICE A DAY 60 tablet 11  . Multiple Vitamins-Minerals (MULTIVITAMINS THER. W/MINERALS) TABS Take 1 tablet by mouth at bedtime.      Marland Kitchen NITROSTAT 0.4 MG SL tablet PLACE 1 TABLET UNDER TONGUE EVERY 5 MINS AS NEEDED FOR CHEST PAIN 25 tablet 0  . pantoprazole (PROTONIX) 40 MG tablet TAKE 1 TABLET BY MOUTH EVERY DAY 90 tablet 1  . rosuvastatin (CRESTOR) 40 MG tablet Take 1 tablet (40 mg  total) by mouth daily. 90 tablet 3  . silodosin (RAPAFLO) 8 MG CAPS capsule Take 1 capsule (8 mg total) by mouth daily with breakfast. 30 capsule 5   Current Facility-Administered Medications on File Prior to Visit  Medication Dose Route Frequency Provider Last Rate Last Dose  . testosterone cypionate (DEPOTESTOTERONE CYPIONATE) injection 200 mg  200 mg Intramuscular Q14 Days [name], MD   200 mg at 01/22/14 1244   No Known Allergies Social History   Social History  . Marital Status: Married    Spouse Name: N/A  . Number of Children: 4  . Years of Education: 12th   Occupational History  . Retired    Social History Main Topics  . Smoking status: Former Smoker    Quit date: 08/19/1961  . Smokeless tobacco: Former Systems developer    Types: Chew  . Alcohol Use: No  . Drug Use: No  . Sexual Activity: No   Other Topics Concern  . Not on file   Social History Narrative   Lives with wife.       Review of Systems  All other systems reviewed and are negative.      Objective:   Physical Exam  HENT:  Right Ear: Tympanic membrane, external ear and ear canal normal.  Left Ear: Tympanic membrane, external ear and ear canal normal.  Nose: Mucosal edema and rhinorrhea present.  Mouth/Throat: Oropharynx is clear and moist. No oropharyngeal exudate.  Eyes: Conjunctivae are normal.  Neck: Neck supple.  Cardiovascular: Normal rate, regular rhythm and normal heart sounds.   No murmur heard. Pulmonary/Chest: Effort normal and breath sounds normal. No respiratory distress. He has no wheezes. He has no rales.  Abdominal: Soft. Bowel sounds are normal. He exhibits no distension. There is no tenderness.  Lymphadenopathy:    He has no cervical adenopathy.  Vitals reviewed.         Assessment & Plan:  Viral URI with cough - Plan: amoxicillin (AMOXIL) 875 MG tablet  Patient appears to have a viral upper respiratory infection. I recommended Coricidin HBP and tincture of time. I  expect symptoms to gradually improve over the next 4-5 days. I did give the patient a prescription for amoxicillin. Should he develop a high fever, sinus pain, severe sinus headache, purulent nasal discharge, I want him to start the medication. Otherwise I asked him to wait it out

## 2015-03-05 ENCOUNTER — Other Ambulatory Visit: Payer: Self-pay | Admitting: Family Medicine

## 2015-03-31 ENCOUNTER — Ambulatory Visit (INDEPENDENT_AMBULATORY_CARE_PROVIDER_SITE_OTHER): Payer: Medicare Other | Admitting: Family Medicine

## 2015-03-31 ENCOUNTER — Encounter: Payer: Self-pay | Admitting: Family Medicine

## 2015-03-31 ENCOUNTER — Other Ambulatory Visit: Payer: Self-pay | Admitting: Family Medicine

## 2015-03-31 VITALS — BP 144/72 | HR 74 | Temp 98.1°F | Resp 18 | Ht 65.0 in | Wt 224.0 lb

## 2015-03-31 DIAGNOSIS — R509 Fever, unspecified: Secondary | ICD-10-CM | POA: Diagnosis not present

## 2015-03-31 DIAGNOSIS — J101 Influenza due to other identified influenza virus with other respiratory manifestations: Secondary | ICD-10-CM | POA: Diagnosis not present

## 2015-03-31 LAB — INFLUENZA A AND B AG, IMMUNOASSAY
Influenza A Antigen: DETECTED — AB
Influenza B Antigen: NOT DETECTED

## 2015-03-31 MED ORDER — OSELTAMIVIR PHOSPHATE 75 MG PO CAPS: 75.0000 mg | ORAL_CAPSULE | Freq: Two times a day (BID) | ORAL | Status: DC

## 2015-03-31 NOTE — Progress Notes (Signed)
Subjective:    Patient ID: [name], male    DOB: 02/16/1934, 82 y.o.   MRN: [id number]  HPI  Symptoms began 48 hours ago with fever, body aches, sore throat, nonproductive cough, head congestion and rhinorrhea. Flu test today is positive for influenza A Past Medical History  Diagnosis Date  . Dyslipidemia   . Seizure disorder ([place])   . Hypothyroidism   . DDD (degenerative disc disease)   . Hx MRSA infection     left buttocks abscess  . Rotator cuff injury     chronic rotator cuff injury status post repair  . Hypertension   . Syncope   . Meniere's disease     Status post shunt  . BPH (benign prostatic hyperplasia)   . Diabetes mellitus without complication ([place])   . Hyperlipidemia   . Coronary artery disease     a. s/p MI tx with Cypher DES to pRCA in 4/04;  b. Echocardiogram 7/09: Normal LV function.  c. Nuclear study 3/13 no ischemia;  d. ETT 2/14 neg;  e. admx with CP => LHC (01/30/2013):  pLAD 40-50%, oD1 70-80%, oOM1 40%, pRCA stent patent, mid RCA 30%, EF 60-65%. => med Rx.  . Stroke [facility])     a. 01/2013=> post cardiac cath CVA to L post communicating artery system; R sided weakness  . Hx of echocardiogram     a. Echocardiogram (01/31/2013): Mild focal basal and mild concentric hypertrophy of the septum, EF 50-55%, normal wall motion, grade 1 diastolic dysfunction, trivial AI, MAC, mild LAE, PASP 35  . Occlusion and stenosis of carotid artery without mention of cerebral infarction     40-59% on carotid doppler 2014; [place] (01/2013): R 1-39%, L 60-79%  . Myocardial infarction [facility])    Past Surgical History  Procedure Laterality Date  . Coronary angioplasty with stent placement  07/14/2002    Stent to the right coronary artery  . Rotator cuff repair      for chronic rotator cuff injury  . Hernia repair  04/10/2008    scrotal hernia repair  . Endolymphatic shunt decompression  06/11/2009     Right endolymphatic sac decompression and shunt  placement  . Colonoscopy     . Left heart catheterization with coronary angiogram N/A 01/30/2013    Procedure: LEFT HEART CATHETERIZATION WITH CORONARY ANGIOGRAM;  Surgeon: Larey Dresser, MD;  Location: [facility] CATH LAB;  Service: Cardiovascular;  Laterality: N/A;   Current Outpatient Prescriptions on File Prior to Visit  Medication Sig Dispense Refill  . carbamazepine (CARBATROL) 300 MG 12 hr capsule TAKE ONE CAPSULE BY MOUTH TWICE A DAY 60 capsule 5  . cloNIDine (CATAPRES) 0.1 MG tablet TAKE 1 TABLET BY MOUTH TWICE A DAY 60 tablet 4  . clopidogrel (PLAVIX) 75 MG tablet TAKE 1 TABLET BY MOUTH EVERY DAY 30 tablet 11  . colestipol (COLESTID) 1 G tablet Take 1 tablet (1 g total) by mouth 2 (two) times daily. 60 tablet 1  . diazepam (VALIUM) 2 MG tablet TAKE 1 TABLET BY MOUTH TWICE A DAY AS NEEDED FOR ANXIETY 60 tablet 2  . hydrochlorothiazide (HYDRODIURIL) 25 MG tablet Take 25 mg by mouth daily.     . hydrochlorothiazide (HYDRODIURIL) 25 MG tablet TAKE 1 TABLET EVERY DAY 30 tablet 9  . isosorbide mononitrate (IMDUR) 30 MG 24 hr tablet TAKE 1 TABLET BY MOUTH EVERY DAY 90 tablet 1  . levothyroxine (SYNTHROID, LEVOTHROID) 25 MCG tablet TAKE 2 TABLETS BY MOUTH DAILY BEFORE BREAKFAST  30 tablet 11  . losartan (COZAAR) 100 MG tablet TAKE 1/2 TABLET BY MOUTH 2 TIMES A DAY 30 tablet 0  . metFORMIN (GLUCOPHAGE) 500 MG tablet TAKE 1 TABLET TWICE A DAY WITH A MEAL 60 tablet 10  . metoprolol (LOPRESSOR) 50 MG tablet TAKE 1 TABLET BY MOUTH TWICE A DAY 60 tablet 11  . Multiple Vitamins-Minerals (MULTIVITAMINS THER. W/MINERALS) TABS Take 1 tablet by mouth at bedtime.      Marland Kitchen NITROSTAT 0.4 MG SL tablet PLACE 1 TABLET UNDER TONGUE EVERY 5 MINS AS NEEDED FOR CHEST PAIN 25 tablet 0  . pantoprazole (PROTONIX) 40 MG tablet TAKE 1 TABLET BY MOUTH EVERY DAY 90 tablet 1  . rosuvastatin (CRESTOR) 40 MG tablet Take 1 tablet (40 mg total) by mouth daily. 90 tablet 3  . silodosin (RAPAFLO) 8 MG CAPS capsule Take 1 capsule (8 mg total) by mouth daily with  breakfast. 30 capsule 5   Current Facility-Administered Medications on File Prior to Visit  Medication Dose Route Frequency Provider Last Rate Last Dose  . testosterone cypionate (DEPOTESTOTERONE CYPIONATE) injection 200 mg  200 mg Intramuscular Q14 Days [name], MD   200 mg at 01/22/14 1244   No Known Allergies Social History   Social History  . Marital Status: Married    Spouse Name: N/A  . Number of Children: 4  . Years of Education: 12th   Occupational History  . Retired    Social History Main Topics  . Smoking status: Former Smoker    Quit date: 08/19/1961  . Smokeless tobacco: Former Systems developer    Types: Chew  . Alcohol Use: No  . Drug Use: No  . Sexual Activity: No   Other Topics Concern  . Not on file   Social History Narrative   Lives with wife.     Review of Systems  All other systems reviewed and are negative.      Objective:   Physical Exam  Constitutional: He appears well-developed and well-nourished. No distress.  HENT:  Right Ear: External ear normal.  Left Ear: External ear normal.  Nose: Mucosal edema and rhinorrhea present. Right sinus exhibits no maxillary sinus tenderness and no frontal sinus tenderness. Left sinus exhibits no maxillary sinus tenderness and no frontal sinus tenderness.  Mouth/Throat: Oropharynx is clear and moist. No oropharyngeal exudate.  Eyes: Conjunctivae are normal.  Neck: Neck supple.  Cardiovascular: Normal rate, regular rhythm and normal heart sounds.   No murmur heard. Pulmonary/Chest: Effort normal and breath sounds normal.  Lymphadenopathy:    He has no cervical adenopathy.  Skin: He is not diaphoretic.  Vitals reviewed.         Assessment & Plan:  Other specified fever - Plan: Influenza a and b  Influenza A - Plan: oseltamivir (TAMIFLU) 75 MG capsule  Begin Tamiflu 75 mg by mouth twice a day for 5 days. His wife has similar symptoms all went ahead and treated her as well with Tamiflu 75 mg by mouth  twice a day for 5 days.

## 2015-04-08 ENCOUNTER — Ambulatory Visit (INDEPENDENT_AMBULATORY_CARE_PROVIDER_SITE_OTHER): Payer: Medicare Other | Admitting: Family Medicine

## 2015-04-08 ENCOUNTER — Encounter: Payer: Self-pay | Admitting: Family Medicine

## 2015-04-08 VITALS — BP 100/58 | HR 72 | Temp 98.3°F | Resp 18 | Ht 65.0 in | Wt 220.0 lb

## 2015-04-08 DIAGNOSIS — J101 Influenza due to other identified influenza virus with other respiratory manifestations: Secondary | ICD-10-CM | POA: Diagnosis not present

## 2015-04-08 MED ORDER — HYDROCODONE-HOMATROPINE 5-1.5 MG/5ML PO SYRP: 5.0000 mL | ORAL_SOLUTION | Freq: Three times a day (TID) | ORAL | Status: DC | PRN

## 2015-04-08 NOTE — Progress Notes (Signed)
Subjective:    Patient ID: [name], male    DOB: 1933/09/09, 82 y.o.   MRN: [id number]  HPI  03/31/15 Symptoms began 48 hours ago with fever, body aches, sore throat, nonproductive cough, head congestion and rhinorrhea. Flu test today is positive for influenza A.  At that time, my plan was: Begin Tamiflu 75 mg by mouth twice a day for 5 days. His wife has similar symptoms all went ahead and treated her as well with Tamiflu 75 mg by mouth twice a day for 5 days.  04/08/15 Patient is here today for recheck. His fever has subsided. His cough has improved. However he continues to have a cough. That is his primary concern. He denies any fever. He denies any pleurisy. He denies any hemoptysis. He denies any chest pain or shortness of breath. He still has some mild runny nose but he is much better than compared to last week. The cough is more of a nuisance than a significant concern. Past Medical History  Diagnosis Date  . Dyslipidemia   . Seizure disorder ([place])   . Hypothyroidism   . DDD (degenerative disc disease)   . Hx MRSA infection     left buttocks abscess  . Rotator cuff injury     chronic rotator cuff injury status post repair  . Hypertension   . Syncope   . Meniere's disease     Status post shunt  . BPH (benign prostatic hyperplasia)   . Diabetes mellitus without complication ([place])   . Hyperlipidemia   . Coronary artery disease     a. s/p MI tx with Cypher DES to pRCA in 4/04;  b. Echocardiogram 7/09: Normal LV function.  c. Nuclear study 3/13 no ischemia;  d. ETT 2/14 neg;  e. admx with CP => LHC (01/30/2013):  pLAD 40-50%, oD1 70-80%, oOM1 40%, pRCA stent patent, mid RCA 30%, EF 60-65%. => med Rx.  . Stroke [facility])     a. 01/2013=> post cardiac cath CVA to L post communicating artery system; R sided weakness  . Hx of echocardiogram     a. Echocardiogram (01/31/2013): Mild focal basal and mild concentric hypertrophy of the septum, EF 50-55%, normal wall motion, grade 1  diastolic dysfunction, trivial AI, MAC, mild LAE, PASP 35  . Occlusion and stenosis of carotid artery without mention of cerebral infarction     40-59% on carotid doppler 2014; [place] (01/2013): R 1-39%, L 60-79%  . Myocardial infarction [facility])    Past Surgical History  Procedure Laterality Date  . Coronary angioplasty with stent placement  07/14/2002    Stent to the right coronary artery  . Rotator cuff repair      for chronic rotator cuff injury  . Hernia repair  04/10/2008    scrotal hernia repair  . Endolymphatic shunt decompression  06/11/2009     Right endolymphatic sac decompression and shunt  placement  . Colonoscopy    . Left heart catheterization with coronary angiogram N/A 01/30/2013    Procedure: LEFT HEART CATHETERIZATION WITH CORONARY ANGIOGRAM;  Surgeon: Larey Dresser, MD;  Location: [facility] CATH LAB;  Service: Cardiovascular;  Laterality: N/A;   Current Outpatient Prescriptions on File Prior to Visit  Medication Sig Dispense Refill  . carbamazepine (CARBATROL) 300 MG 12 hr capsule TAKE ONE CAPSULE BY MOUTH TWICE A DAY 60 capsule 5  . cloNIDine (CATAPRES) 0.1 MG tablet TAKE 1 TABLET BY MOUTH TWICE A DAY 60 tablet 4  . clopidogrel (PLAVIX) 75 MG  tablet TAKE 1 TABLET BY MOUTH EVERY DAY 30 tablet 11  . colestipol (COLESTID) 1 G tablet Take 1 tablet (1 g total) by mouth 2 (two) times daily. 60 tablet 1  . diazepam (VALIUM) 2 MG tablet TAKE 1 TABLET BY MOUTH TWICE A DAY AS NEEDED FOR ANXIETY 60 tablet 2  . hydrochlorothiazide (HYDRODIURIL) 25 MG tablet Take 25 mg by mouth daily.     . hydrochlorothiazide (HYDRODIURIL) 25 MG tablet TAKE 1 TABLET EVERY DAY 30 tablet 9  . isosorbide mononitrate (IMDUR) 30 MG 24 hr tablet TAKE 1 TABLET BY MOUTH EVERY DAY 90 tablet 1  . levothyroxine (SYNTHROID, LEVOTHROID) 25 MCG tablet TAKE 2 TABLETS BY MOUTH DAILY BEFORE BREAKFAST 30 tablet 11  . losartan (COZAAR) 100 MG tablet TAKE 1/2 TABLET BY MOUTH 2 TIMES A DAY 30 tablet 0  . metFORMIN  (GLUCOPHAGE) 500 MG tablet TAKE 1 TABLET TWICE A DAY WITH A MEAL 60 tablet 10  . metoprolol (LOPRESSOR) 50 MG tablet TAKE 1 TABLET BY MOUTH TWICE A DAY 60 tablet 11  . Multiple Vitamins-Minerals (MULTIVITAMINS THER. W/MINERALS) TABS Take 1 tablet by mouth at bedtime.      Marland Kitchen NITROSTAT 0.4 MG SL tablet PLACE 1 TABLET UNDER TONGUE EVERY 5 MINS AS NEEDED FOR CHEST PAIN 25 tablet 0  . pantoprazole (PROTONIX) 40 MG tablet TAKE 1 TABLET BY MOUTH EVERY DAY 90 tablet 1  . rosuvastatin (CRESTOR) 40 MG tablet Take 1 tablet (40 mg total) by mouth daily. 90 tablet 3  . silodosin (RAPAFLO) 8 MG CAPS capsule Take 1 capsule (8 mg total) by mouth daily with breakfast. 30 capsule 5   Current Facility-Administered Medications on File Prior to Visit  Medication Dose Route Frequency Provider Last Rate Last Dose  . testosterone cypionate (DEPOTESTOTERONE CYPIONATE) injection 200 mg  200 mg Intramuscular Q14 Days [name], MD   200 mg at 01/22/14 1244   No Known Allergies Social History   Social History  . Marital Status: Married    Spouse Name: N/A  . Number of Children: 4  . Years of Education: 12th   Occupational History  . Retired    Social History Main Topics  . Smoking status: Former Smoker    Quit date: 08/19/1961  . Smokeless tobacco: Former Systems developer    Types: Chew  . Alcohol Use: No  . Drug Use: No  . Sexual Activity: No   Other Topics Concern  . Not on file   Social History Narrative   Lives with wife.     Review of Systems  All other systems reviewed and are negative.      Objective:   Physical Exam  Constitutional: He appears well-developed and well-nourished. No distress.  HENT:  Right Ear: External ear normal.  Left Ear: External ear normal.  Nose: Mucosal edema and rhinorrhea present. Right sinus exhibits no maxillary sinus tenderness and no frontal sinus tenderness. Left sinus exhibits no maxillary sinus tenderness and no frontal sinus tenderness.  Mouth/Throat:  Oropharynx is clear and moist. No oropharyngeal exudate.  Eyes: Conjunctivae are normal.  Neck: Neck supple.  Cardiovascular: Normal rate, regular rhythm and normal heart sounds.   No murmur heard. Pulmonary/Chest: Effort normal and breath sounds normal.  Lymphadenopathy:    He has no cervical adenopathy.  Skin: He is not diaphoretic.  Vitals reviewed.         Assessment & Plan:  Influenza A - Plan: HYDROcodone-homatropine (HYCODAN) 5-1.5 MG/5ML syrup  Patient seems to be  improving significantly. I recommended tincture of time. I anticipate that the patient will be almost back to normal over the next 48-72 hours. Recheck immediately if worse otherwise no follow-up is necessary

## 2015-04-17 ENCOUNTER — Other Ambulatory Visit: Payer: Self-pay | Admitting: Family Medicine

## 2015-04-18 NOTE — Telephone Encounter (Signed)
?   OK to Refill  

## 2015-04-18 NOTE — Telephone Encounter (Signed)
ok 

## 2015-05-05 ENCOUNTER — Other Ambulatory Visit: Payer: Self-pay | Admitting: Family Medicine

## 2015-05-05 DIAGNOSIS — R351 Nocturia: Secondary | ICD-10-CM | POA: Diagnosis not present

## 2015-05-05 DIAGNOSIS — Z0000 Encounter for general adult medical examination without abnormal findings: Secondary | ICD-10-CM | POA: Diagnosis not present

## 2015-05-05 DIAGNOSIS — N401 Enlarged prostate with lower urinary tract symptoms: Secondary | ICD-10-CM | POA: Diagnosis not present

## 2015-05-05 DIAGNOSIS — N138 Other obstructive and reflux uropathy: Secondary | ICD-10-CM | POA: Diagnosis not present

## 2015-05-06 DIAGNOSIS — L570 Actinic keratosis: Secondary | ICD-10-CM | POA: Diagnosis not present

## 2015-05-06 DIAGNOSIS — L821 Other seborrheic keratosis: Secondary | ICD-10-CM | POA: Diagnosis not present

## 2015-06-01 ENCOUNTER — Other Ambulatory Visit: Payer: Self-pay | Admitting: Family Medicine

## 2015-06-05 ENCOUNTER — Other Ambulatory Visit: Payer: Self-pay | Admitting: Family Medicine

## 2015-06-05 ENCOUNTER — Encounter: Payer: Self-pay | Admitting: Family Medicine

## 2015-06-05 NOTE — Telephone Encounter (Signed)
Medication refill for one time only.  Patient needs to be seen.  Letter sent for patient to call and schedule 

## 2015-06-10 DIAGNOSIS — H8101 Meniere's disease, right ear: Secondary | ICD-10-CM | POA: Diagnosis not present

## 2015-06-10 DIAGNOSIS — H6123 Impacted cerumen, bilateral: Secondary | ICD-10-CM | POA: Diagnosis not present

## 2015-06-10 DIAGNOSIS — H903 Sensorineural hearing loss, bilateral: Secondary | ICD-10-CM | POA: Diagnosis not present

## 2015-06-16 ENCOUNTER — Other Ambulatory Visit: Payer: Self-pay | Admitting: Family Medicine

## 2015-06-17 NOTE — Telephone Encounter (Signed)
Refill appropriate and filled per protocol. 

## 2015-06-20 ENCOUNTER — Telehealth: Payer: Self-pay | Admitting: Internal Medicine

## 2015-06-20 ENCOUNTER — Telehealth: Payer: Self-pay | Admitting: Family Medicine

## 2015-06-20 DIAGNOSIS — R197 Diarrhea, unspecified: Secondary | ICD-10-CM

## 2015-06-20 NOTE — Telephone Encounter (Signed)
Ok to do

## 2015-06-20 NOTE — Telephone Encounter (Signed)
Referral placed to [facility],pt aware

## 2015-06-20 NOTE — Telephone Encounter (Signed)
ok 

## 2015-06-20 NOTE — Telephone Encounter (Signed)
Sure

## 2015-06-20 NOTE — Telephone Encounter (Signed)
Pt came in to the office requesting a referral to Dr. Henrene Pastor with [facility] regarding his frequent diarrhea. Please let him know if he needs to come in for an OV first. [phone]

## 2015-06-22 ENCOUNTER — Other Ambulatory Visit: Payer: Self-pay | Admitting: Family Medicine

## 2015-06-23 ENCOUNTER — Encounter: Payer: Self-pay | Admitting: Physician Assistant

## 2015-06-23 ENCOUNTER — Other Ambulatory Visit: Payer: Self-pay | Admitting: Family Medicine

## 2015-06-23 ENCOUNTER — Ambulatory Visit (INDEPENDENT_AMBULATORY_CARE_PROVIDER_SITE_OTHER): Payer: Medicare Other | Admitting: Physician Assistant

## 2015-06-23 VITALS — BP 150/80 | HR 72 | Temp 97.4°F | Resp 18 | Wt 224.0 lb

## 2015-06-23 DIAGNOSIS — L239 Allergic contact dermatitis, unspecified cause: Secondary | ICD-10-CM

## 2015-06-23 DIAGNOSIS — L200 Besnier's prurigo: Secondary | ICD-10-CM | POA: Diagnosis not present

## 2015-06-23 MED ORDER — PREDNISONE 20 MG PO TABS: 20.0000 mg | ORAL_TABLET | Freq: Every day | ORAL | Status: DC

## 2015-06-23 NOTE — Progress Notes (Signed)
Patient ID: [name] MRN: [id number], DOB: [birth year], 82 y.o. Date of Encounter: 06/23/2015, 3:45 PM    Chief Complaint:  Chief Complaint  Patient presents with  . unknown insect bite    rt little finger,  finger swelling and feeling numb     HPI: 82 y.o. year old white male presents with above.   Says this just happened about 20 minutes ago. Says he was doing some work in his yard. Says that he put his right hand down on a box and he felt sting/bite to base of right 5th finger. Says he looked but he could not see what it was that bit/stung him. Saw no spider, insect,bee .  Can see the sting mark/bite mark at the base of the right fifth finger. Is developing some mild pink erythema and mild swelling at that site.  Is having no shortness of breath. No difficulty breathing. No tightness in his throat. Has taken no medication to treat this so far.     Home Meds:   Outpatient Prescriptions Prior to Visit  Medication Sig Dispense Refill  . atorvastatin (LIPITOR) 80 MG tablet TAKE 1 TABLET BY MOUTH EVERY DAY 90 tablet 3  . carbamazepine (CARBATROL) 300 MG 12 hr capsule TAKE ONE CAPSULE BY MOUTH TWICE A DAY 60 capsule 11  . cloNIDine (CATAPRES) 0.1 MG tablet TAKE 1 TABLET TWICE A DAY 60 tablet 6  . clopidogrel (PLAVIX) 75 MG tablet TAKE 1 TABLET BY MOUTH EVERY DAY 30 tablet 11  . colestipol (COLESTID) 1 G tablet Take 1 tablet (1 g total) by mouth 2 (two) times daily. 60 tablet 1  . diazepam (VALIUM) 2 MG tablet TAKE 1 TABLET TWICE A DAY AS NEEDED ANXIETY 60 tablet 2  . hydrochlorothiazide (HYDRODIURIL) 25 MG tablet Take 25 mg by mouth daily.     . hydrochlorothiazide (HYDRODIURIL) 25 MG tablet TAKE 1 TABLET EVERY DAY 30 tablet 11  . HYDROcodone-homatropine (HYCODAN) 5-1.5 MG/5ML syrup Take 5 mLs by mouth every 8 (eight) hours as needed for cough. 120 mL 0  . isosorbide mononitrate (IMDUR) 30 MG 24 hr tablet TAKE 1 TABLET BY MOUTH EVERY DAY 90 tablet 1  . levothyroxine  (SYNTHROID, LEVOTHROID) 25 MCG tablet TAKE 2 TABLETS BY MOUTH DAILY BEFORE BREAKFAST 30 tablet 11  . losartan (COZAAR) 100 MG tablet TAKE 1/2 TABLET BY MOUTH 2 TIMES A DAY 30 tablet 1  . metFORMIN (GLUCOPHAGE) 500 MG tablet TAKE 1 TABLET TWICE A DAY WITH A MEAL 60 tablet 10  . metoprolol (LOPRESSOR) 50 MG tablet TAKE 1 TABLET BY MOUTH TWICE A DAY 60 tablet 11  . Multiple Vitamins-Minerals (MULTIVITAMINS THER. W/MINERALS) TABS Take 1 tablet by mouth at bedtime.      Marland Kitchen NITROSTAT 0.4 MG SL tablet PLACE 1 TABLET UNDER TONGUE EVERY 5 MINS AS NEEDED FOR CHEST PAIN 25 tablet 0  . pantoprazole (PROTONIX) 40 MG tablet TAKE 1 TABLET BY MOUTH EVERY DAY 90 tablet 1  . rosuvastatin (CRESTOR) 40 MG tablet Take 1 tablet (40 mg total) by mouth daily. 90 tablet 3  . silodosin (RAPAFLO) 8 MG CAPS capsule Take 1 capsule (8 mg total) by mouth daily with breakfast. 30 capsule 5   Facility-Administered Medications Prior to Visit  Medication Dose Route Frequency Provider Last Rate Last Dose  . testosterone cypionate (DEPOTESTOTERONE CYPIONATE) injection 200 mg  200 mg Intramuscular Q14 Days [name], MD   200 mg at 01/22/14 1244    Allergies: No Known  Allergies    Review of Systems: See HPI for pertinent ROS. All other ROS negative.    Physical Exam: Blood pressure 150/80, pulse 72, temperature 97.4 F (36.3 C), temperature source Oral, resp. rate 18, weight 224 lb (101.606 kg)., Body mass index is 37.28 kg/(m^2). General:  WM. Appears in no acute distress. Neck: Supple. No thyromegaly. No lymphadenopathy. No swelling of the throat/neck. Lungs: Clear bilaterally to auscultation without wheezes, rales, or rhonchi. Breathing is unlabored. Heart: Regular rhythm. No murmurs, rubs, or gallops. Msk:  Strength and tone normal for age. Extremities/Skin: Right Hand---5th finger--at base of finger--this area has mild swelling, mild diffuse pink erythema.  Neuro: Alert and oriented X 3. Moves all extremities  spontaneously. Gait is normal. CNII-XII grossly in tact. Psych:  Responds to questions appropriately with a normal affect.     ASSESSMENT AND PLAN:  82 y.o. year old male with  1. Allergic dermatitis Give Benadryl now.  He is to take oral Benadryl as directed on regular basis until erythema/swelling resolve.  If swelling and erythema increase then he can take the prednisone. If symptoms still not controlled with this then follow-up. If develops any difficulty breathing or irritation/swelling in the throat go to the emergency room immediately. - predniSONE (DELTASONE) 20 MG tablet; Take 1 tablet (20 mg total) by mouth daily with breakfast.  Dispense: 5 tablet; Refill: 0   Signed, [address] [place], [place], [facility] 06/23/2015 3:45 PM

## 2015-06-23 NOTE — Telephone Encounter (Signed)
LM on Vmail to CB to schedule an appt °

## 2015-06-25 ENCOUNTER — Other Ambulatory Visit: Payer: Self-pay | Admitting: Family Medicine

## 2015-07-14 ENCOUNTER — Other Ambulatory Visit: Payer: Self-pay | Admitting: Family Medicine

## 2015-07-15 ENCOUNTER — Ambulatory Visit (INDEPENDENT_AMBULATORY_CARE_PROVIDER_SITE_OTHER): Payer: Medicare Other | Admitting: Family Medicine

## 2015-07-15 ENCOUNTER — Encounter: Payer: Self-pay | Admitting: Family Medicine

## 2015-07-15 VITALS — BP 118/60 | HR 80 | Temp 98.0°F | Resp 16 | Ht 65.0 in | Wt 223.0 lb

## 2015-07-15 DIAGNOSIS — M25511 Pain in right shoulder: Secondary | ICD-10-CM | POA: Diagnosis not present

## 2015-07-15 NOTE — Progress Notes (Signed)
Subjective:    Patient ID: [name], male    DOB: 24-Aug-1933, 82 y.o.   MRN: [id number]  HPI The patient reports 2 weeks of pain in his right shoulder. Abduction actively is limited to 70 without pain. Passive abduction is limited to 100 until severe pain. He has a positive empty can sign. He has a positive Hawkins sign. He has pain with external rotation and is extremely weak with external rotation. He has pain with internal rotation. There is no crepitus in the shoulder joint.  He denies any specific injury Past Medical History  Diagnosis Date  . Dyslipidemia   . Seizure disorder ([place])   . Hypothyroidism   . DDD (degenerative disc disease)   . Hx MRSA infection     left buttocks abscess  . Rotator cuff injury     chronic rotator cuff injury status post repair  . Hypertension   . Syncope   . Meniere's disease     Status post shunt  . BPH (benign prostatic hyperplasia)   . Diabetes mellitus without complication ([place])   . Hyperlipidemia   . Coronary artery disease     a. s/p MI tx with Cypher DES to pRCA in 4/04;  b. Echocardiogram 7/09: Normal LV function.  c. Nuclear study 3/13 no ischemia;  d. ETT 2/14 neg;  e. admx with CP => LHC (01/30/2013):  pLAD 40-50%, oD1 70-80%, oOM1 40%, pRCA stent patent, mid RCA 30%, EF 60-65%. => med Rx.  . Stroke [facility])     a. 01/2013=> post cardiac cath CVA to L post communicating artery system; R sided weakness  . Hx of echocardiogram     a. Echocardiogram (01/31/2013): Mild focal basal and mild concentric hypertrophy of the septum, EF 50-55%, normal wall motion, grade 1 diastolic dysfunction, trivial AI, MAC, mild LAE, PASP 35  . Occlusion and stenosis of carotid artery without mention of cerebral infarction     40-59% on carotid doppler 2014; [place] (01/2013): R 1-39%, L 60-79%  . Myocardial infarction [facility])    Past Surgical History  Procedure Laterality Date  . Coronary angioplasty with stent placement  07/14/2002    Stent to the right  coronary artery  . Rotator cuff repair      for chronic rotator cuff injury  . Hernia repair  04/10/2008    scrotal hernia repair  . Endolymphatic shunt decompression  06/11/2009     Right endolymphatic sac decompression and shunt  placement  . Colonoscopy    . Left heart catheterization with coronary angiogram N/A 01/30/2013    Procedure: LEFT HEART CATHETERIZATION WITH CORONARY ANGIOGRAM;  Surgeon: Larey Dresser, MD;  Location: [facility] C[facility] LAB;  Service: Cardiovascular;  Laterality: N/A;   Current Outpatient Prescriptions on File Prior to Visit  Medication Sig Dispense Refill  . atorvastatin (LIPITOR) 80 MG tablet TAKE 1 TABLET BY MOUTH EVERY DAY 90 tablet 3  . carbamazepine (CARBATROL) 300 MG 12 hr capsule TAKE ONE CAPSULE BY MOUTH TWICE A DAY 60 capsule 11  . cloNIDine (CATAPRES) 0.1 MG tablet TAKE 1 TABLET TWICE A DAY 60 tablet 6  . clopidogrel (PLAVIX) 75 MG tablet TAKE 1 TABLET BY MOUTH EVERY DAY 30 tablet 11  . colestipol (COLESTID) 1 G tablet Take 1 tablet (1 g total) by mouth 2 (two) times daily. 60 tablet 1  . diazepam (VALIUM) 2 MG tablet TAKE 1 TABLET TWICE A DAY AS NEEDED ANXIETY 60 tablet 2  . hydrochlorothiazide (HYDRODIURIL) 25 MG tablet  TAKE 1 TABLET EVERY DAY 30 tablet 11  . isosorbide mononitrate (IMDUR) 30 MG 24 hr tablet TAKE 1 TABLET BY MOUTH EVERY DAY 90 tablet 1  . levothyroxine (SYNTHROID, LEVOTHROID) 25 MCG tablet TAKE 2 TABLETS BY MOUTH DAILY BEFORE BREAKFAST 30 tablet 11  . losartan (COZAAR) 100 MG tablet TAKE 1/2 TABLET BY MOUTH 2 TIMES A DAY 30 tablet 1  . metFORMIN (GLUCOPHAGE) 500 MG tablet TAKE 1 TABLET TWICE A DAY WITH A MEAL 60 tablet 10  . metoprolol (LOPRESSOR) 50 MG tablet TAKE 1 TABLET BY MOUTH TWICE A DAY 60 tablet 11  . Multiple Vitamins-Minerals (MULTIVITAMINS THER. W/MINERALS) TABS Take 1 tablet by mouth at bedtime.      Marland Kitchen NITROSTAT 0.4 MG SL tablet PLACE 1 TABLET UNDER TONGUE EVERY 5 MINS AS NEEDED FOR CHEST PAIN 25 tablet 0  . pantoprazole  (PROTONIX) 40 MG tablet TAKE 1 TABLET BY MOUTH EVERY DAY 90 tablet 1  . rosuvastatin (CRESTOR) 40 MG tablet Take 1 tablet (40 mg total) by mouth daily. 90 tablet 3  . silodosin (RAPAFLO) 8 MG CAPS capsule Take 1 capsule (8 mg total) by mouth daily with breakfast. 30 capsule 5   Current Facility-Administered Medications on File Prior to Visit  Medication Dose Route Frequency Provider Last Rate Last Dose  . testosterone cypionate (DEPOTESTOTERONE CYPIONATE) injection 200 mg  200 mg Intramuscular Q14 Days [name], MD   200 mg at 01/22/14 1244   No Known Allergies Social History   Social History  . Marital Status: Married    Spouse Name: N/A  . Number of Children: 4  . Years of Education: 12th   Occupational History  . Retired    Social History Main Topics  . Smoking status: Former Smoker    Quit date: 08/19/1961  . Smokeless tobacco: Former Systems developer    Types: Chew  . Alcohol Use: No  . Drug Use: No  . Sexual Activity: No   Other Topics Concern  . Not on file   Social History Narrative   Lives with wife.      Review of Systems  All other systems reviewed and are negative.      Objective:   Physical Exam  Cardiovascular: Normal rate, regular rhythm and normal heart sounds.   Pulmonary/Chest: Effort normal and breath sounds normal. No respiratory distress. He has no wheezes. He has no rales.  Musculoskeletal:       Right shoulder: He exhibits decreased range of motion, tenderness, pain and decreased strength. He exhibits no crepitus.  Vitals reviewed.         Assessment & Plan:  Right shoulder pain  I suspect rotator cuff tendinitis although I cannot exclude a partial tear particular given his weakness with external rotation. Using sterile technique, I injected the right subacromial space with 2 mL of lidocaine, 2 mL of Marcaine, and 2 mL of 40 mg per mL Kenalog. The patient tolerated procedure well without complication. If pain is not improving over the next  1-2 weeks, I will proceed with an MRI of the right shoulder.

## 2015-07-15 NOTE — Telephone Encounter (Signed)
ok 

## 2015-07-15 NOTE — Telephone Encounter (Signed)
Ok to refill valium

## 2015-07-22 ENCOUNTER — Other Ambulatory Visit: Payer: Self-pay | Admitting: Family Medicine

## 2015-07-22 NOTE — Telephone Encounter (Signed)
Refill appropriate and filled per protocol. 

## 2015-07-24 ENCOUNTER — Encounter: Payer: Self-pay | Admitting: Family Medicine

## 2015-07-24 ENCOUNTER — Emergency Department (HOSPITAL_COMMUNITY)
Admission: EM | Admit: 2015-07-24 | Discharge: 2015-07-24 | Disposition: A | Discharge status: Home / Self Care | Payer: Medicare Other | Attending: Emergency Medicine | Admitting: Emergency Medicine

## 2015-07-24 ENCOUNTER — Emergency Department (HOSPITAL_COMMUNITY): Payer: Medicare Other

## 2015-07-24 ENCOUNTER — Ambulatory Visit (INDEPENDENT_AMBULATORY_CARE_PROVIDER_SITE_OTHER): Payer: Medicare Other | Admitting: Family Medicine

## 2015-07-24 ENCOUNTER — Encounter (HOSPITAL_COMMUNITY): Payer: Self-pay

## 2015-07-24 VITALS — BP 110/64 | HR 78 | Temp 98.0°F | Resp 16 | Ht 65.0 in | Wt 220.0 lb

## 2015-07-24 DIAGNOSIS — Z8669 Personal history of other diseases of the nervous system and sense organs: Secondary | ICD-10-CM | POA: Insufficient documentation

## 2015-07-24 DIAGNOSIS — R0781 Pleurodynia: Secondary | ICD-10-CM

## 2015-07-24 DIAGNOSIS — Y9289 Other specified places as the place of occurrence of the external cause: Secondary | ICD-10-CM | POA: Insufficient documentation

## 2015-07-24 DIAGNOSIS — S4991XA Unspecified injury of right shoulder and upper arm, initial encounter: Secondary | ICD-10-CM | POA: Insufficient documentation

## 2015-07-24 DIAGNOSIS — Z87891 Personal history of nicotine dependence: Secondary | ICD-10-CM | POA: Diagnosis not present

## 2015-07-24 DIAGNOSIS — I10 Essential (primary) hypertension: Secondary | ICD-10-CM | POA: Diagnosis not present

## 2015-07-24 DIAGNOSIS — E785 Hyperlipidemia, unspecified: Secondary | ICD-10-CM

## 2015-07-24 DIAGNOSIS — Z7902 Long term (current) use of antithrombotics/antiplatelets: Secondary | ICD-10-CM | POA: Diagnosis not present

## 2015-07-24 DIAGNOSIS — Z8673 Personal history of transient ischemic attack (TIA), and cerebral infarction without residual deficits: Secondary | ICD-10-CM | POA: Diagnosis not present

## 2015-07-24 DIAGNOSIS — E119 Type 2 diabetes mellitus without complications: Secondary | ICD-10-CM | POA: Insufficient documentation

## 2015-07-24 DIAGNOSIS — Z8739 Personal history of other diseases of the musculoskeletal system and connective tissue: Secondary | ICD-10-CM | POA: Diagnosis not present

## 2015-07-24 DIAGNOSIS — Z9889 Other specified postprocedural states: Secondary | ICD-10-CM | POA: Insufficient documentation

## 2015-07-24 DIAGNOSIS — IMO0001 Reserved for inherently not codable concepts without codable children: Secondary | ICD-10-CM

## 2015-07-24 DIAGNOSIS — E038 Other specified hypothyroidism: Secondary | ICD-10-CM

## 2015-07-24 DIAGNOSIS — Y998 Other external cause status: Secondary | ICD-10-CM | POA: Insufficient documentation

## 2015-07-24 DIAGNOSIS — Z7984 Long term (current) use of oral hypoglycemic drugs: Secondary | ICD-10-CM | POA: Insufficient documentation

## 2015-07-24 DIAGNOSIS — I2510 Atherosclerotic heart disease of native coronary artery without angina pectoris: Secondary | ICD-10-CM | POA: Insufficient documentation

## 2015-07-24 DIAGNOSIS — E039 Hypothyroidism, unspecified: Secondary | ICD-10-CM | POA: Diagnosis not present

## 2015-07-24 DIAGNOSIS — W19XXXA Unspecified fall, initial encounter: Secondary | ICD-10-CM

## 2015-07-24 DIAGNOSIS — S29001A Unspecified injury of muscle and tendon of front wall of thorax, initial encounter: Secondary | ICD-10-CM | POA: Diagnosis not present

## 2015-07-24 DIAGNOSIS — R05 Cough: Secondary | ICD-10-CM | POA: Diagnosis not present

## 2015-07-24 DIAGNOSIS — Z79899 Other long term (current) drug therapy: Secondary | ICD-10-CM | POA: Diagnosis not present

## 2015-07-24 DIAGNOSIS — M25511 Pain in right shoulder: Secondary | ICD-10-CM | POA: Diagnosis not present

## 2015-07-24 DIAGNOSIS — Z9861 Coronary angioplasty status: Secondary | ICD-10-CM | POA: Diagnosis not present

## 2015-07-24 DIAGNOSIS — W010XXA Fall on same level from slipping, tripping and stumbling without subsequent striking against object, initial encounter: Secondary | ICD-10-CM | POA: Insufficient documentation

## 2015-07-24 DIAGNOSIS — Z125 Encounter for screening for malignant neoplasm of prostate: Secondary | ICD-10-CM | POA: Diagnosis not present

## 2015-07-24 DIAGNOSIS — S0990XA Unspecified injury of head, initial encounter: Secondary | ICD-10-CM | POA: Insufficient documentation

## 2015-07-24 DIAGNOSIS — N400 Enlarged prostate without lower urinary tract symptoms: Secondary | ICD-10-CM | POA: Insufficient documentation

## 2015-07-24 DIAGNOSIS — Z0000 Encounter for general adult medical examination without abnormal findings: Secondary | ICD-10-CM | POA: Diagnosis not present

## 2015-07-24 DIAGNOSIS — Z8614 Personal history of Methicillin resistant Staphylococcus aureus infection: Secondary | ICD-10-CM | POA: Diagnosis not present

## 2015-07-24 DIAGNOSIS — Y9389 Activity, other specified: Secondary | ICD-10-CM | POA: Insufficient documentation

## 2015-07-24 DIAGNOSIS — S199XXA Unspecified injury of neck, initial encounter: Secondary | ICD-10-CM | POA: Diagnosis not present

## 2015-07-24 DIAGNOSIS — I252 Old myocardial infarction: Secondary | ICD-10-CM | POA: Insufficient documentation

## 2015-07-24 LAB — URINALYSIS, ROUTINE W REFLEX MICROSCOPIC
Bilirubin Urine: NEGATIVE
Glucose, UA: NEGATIVE mg/dL
Hgb urine dipstick: NEGATIVE
Ketones, ur: 15 mg/dL — AB
Leukocytes, UA: NEGATIVE
Nitrite: NEGATIVE
Protein, ur: NEGATIVE mg/dL
Specific Gravity, Urine: 1.024 (ref 1.005–1.030)
pH: 5.5 (ref 5.0–8.0)

## 2015-07-24 LAB — HEMOGLOBIN A1C
Hgb A1c MFr Bld: 6.6 % — ABNORMAL HIGH (ref <5.7)
Mean Plasma Glucose: 143 mg/dL

## 2015-07-24 LAB — CBC
HCT: 38.7 % — ABNORMAL LOW (ref 39.0–52.0)
Hemoglobin: 12.5 g/dL — ABNORMAL LOW (ref 13.0–17.0)
MCH: 29.7 pg (ref 26.0–34.0)
MCHC: 32.3 g/dL (ref 30.0–36.0)
MCV: 91.9 fL (ref 78.0–100.0)
Platelets: 351 10*3/uL (ref 150–400)
RBC: 4.21 MIL/uL — ABNORMAL LOW (ref 4.22–5.81)
RDW: 14 % (ref 11.5–15.5)
WBC: 12 10*3/uL — ABNORMAL HIGH (ref 4.0–10.5)

## 2015-07-24 LAB — CBC WITH DIFFERENTIAL/PLATELET
Basophils Absolute: 0 cells/uL (ref 0–200)
Basophils Relative: 0 %
Eosinophils Absolute: 184 cells/uL (ref 15–500)
Eosinophils Relative: 2 %
HCT: 38.3 % — ABNORMAL LOW (ref 38.5–50.0)
Hemoglobin: 12.8 g/dL — ABNORMAL LOW (ref 13.0–17.0)
Lymphocytes Relative: 22 %
Lymphs Abs: 2024 cells/uL (ref 850–3900)
MCH: 30.3 pg (ref 27.0–33.0)
MCHC: 33.4 g/dL (ref 32.0–36.0)
MCV: 90.5 fL (ref 80.0–100.0)
MPV: 9.7 fL (ref 7.5–12.5)
Monocytes Absolute: 828 cells/uL (ref 200–950)
Monocytes Relative: 9 %
Neutro Abs: 6164 cells/uL (ref 1500–7800)
Neutrophils Relative %: 67 %
Platelets: 348 10*3/uL (ref 140–400)
RBC: 4.23 MIL/uL (ref 4.20–5.80)
RDW: 13.9 % (ref 11.0–15.0)
WBC: 9.2 10*3/uL (ref 3.8–10.8)

## 2015-07-24 LAB — I-STAT TROPONIN, ED: Troponin i, poc: 0.02 ng/mL (ref 0.00–0.08)

## 2015-07-24 LAB — COMPLETE METABOLIC PANEL WITH GFR
ALT: 21 U/L (ref 9–46)
AST: 22 U/L (ref 10–35)
Albumin: 4.2 g/dL (ref 3.6–5.1)
Alkaline Phosphatase: 78 U/L (ref 40–115)
BUN: 15 mg/dL (ref 7–25)
CO2: 23 mmol/L (ref 20–31)
Calcium: 8.9 mg/dL (ref 8.6–10.3)
Chloride: 96 mmol/L — ABNORMAL LOW (ref 98–110)
Creat: 0.83 mg/dL (ref 0.70–1.11)
GFR, Est African American: >89 mL/min (ref >=60)
GFR, Est Non African American: 83 mL/min (ref >=60)
Glucose, Bld: 138 mg/dL — ABNORMAL HIGH (ref 70–99)
Potassium: 3.8 mmol/L (ref 3.5–5.3)
Sodium: 132 mmol/L — ABNORMAL LOW (ref 135–146)
Total Bilirubin: 0.5 mg/dL (ref 0.2–1.2)
Total Protein: 6.1 g/dL (ref 6.1–8.1)

## 2015-07-24 LAB — TSH: TSH: 4.49 mIU/L (ref 0.40–4.50)

## 2015-07-24 LAB — PROTIME-INR
INR: 1.04 (ref 0.00–1.49)
Prothrombin Time: 13.8 seconds (ref 11.6–15.2)

## 2015-07-24 LAB — BASIC METABOLIC PANEL
Anion gap: 14 (ref 5–15)
BUN: 18 mg/dL (ref 6–20)
CO2: 24 mmol/L (ref 22–32)
Calcium: 9.4 mg/dL (ref 8.9–10.3)
Chloride: 97 mmol/L — ABNORMAL LOW (ref 101–111)
Creatinine, Ser: 1.07 mg/dL (ref 0.61–1.24)
GFR calc Af Amer: >60 mL/min (ref >60)
GFR calc non Af Amer: >60 mL/min (ref >60)
Glucose, Bld: 132 mg/dL — ABNORMAL HIGH (ref 65–99)
Potassium: 4.5 mmol/L (ref 3.5–5.1)
Sodium: 135 mmol/L (ref 135–145)

## 2015-07-24 MED ORDER — TRAMADOL HCL 50 MG PO TABS: 50.0000 mg | ORAL_TABLET | Freq: Four times a day (QID) | ORAL | Status: DC | PRN

## 2015-07-24 MED ORDER — FENTANYL CITRATE (PF) 100 MCG/2ML IJ SOLN
25.0000 ug | Freq: Once | INTRAMUSCULAR | Status: AC
  Administered 2015-07-24: 25 ug via INTRAVENOUS
  Filled 2015-07-24: qty 2

## 2015-07-24 NOTE — ED Notes (Signed)
75 mcg of Fentanyl wasted in sink with Wilfrid Lund.

## 2015-07-24 NOTE — Discharge Instructions (Signed)
As discussed, your evaluation today has been largely reassuring.  But, it is important that you monitor your condition carefully, and do not hesitate to return to the ED if you develop new, or concerning changes in your condition.  For the next 3 days please use the provided prescription pain medication, in addition to ibuprofen, 200 mg 3 times daily.  Please use the provided breathing device 4 times daily for the next 3 days.  Otherwise, please follow-up with your physician for appropriate ongoing care.

## 2015-07-24 NOTE — ED Notes (Signed)
37mcg Fentanyl wasted in the sink with Luvenia Heller

## 2015-07-24 NOTE — ED Notes (Signed)
Patient given incentive spirometer and verbalized understanding

## 2015-07-24 NOTE — ED Notes (Signed)
79mcg Fentanyl wasted in the sink with Eligha Bridegroom

## 2015-07-24 NOTE — Progress Notes (Signed)
Subjective:    Patient ID: [name], male    DOB: [birth year], 82 y.o.   MRN: [id number]  HPI A she is a very pleasant 82 year old white male who is here today for complete physical exam. Past medical history significant for coronary artery disease, CVA, well controlled diabetes mellitus, hypertension, and hyperlipidemia. Immunizations are up-to-date: Immunization History  Administered Date(s) Administered  . Influenza,inj,Quad PF,36+ Mos 12/04/2012, 01/01/2014, 01/15/2015  . Pneumococcal Conjugate-13 03/01/2013  . Pneumococcal Polysaccharide-23 02/03/1999, 02/27/2010  . Td 08/16/1996  . Tdap 07/31/2014   Colonoscopy was performed in 2015. He is not due for repeat colonoscopy until 2025. We did discuss the shingles vaccine today but he would like to check on the price prior to receiving that. He is overdue for diabetic eye exam. Diabetic foot exam is performed today and is normal. He does have some mild diminished pulses in the left foot at the dorsalis pedis but these are asymptomatic and he denies any claudication. He denies any chest pain shortness of breath or dyspnea on exertion. He denies any polyuria, polydipsia, or blurred vision. He does occasionally have black floaters in his field of vision usually worse in the morning. He is overdue for diabetic eye exam and I recommended we schedule that as soon as possible. Blood pressures well controlled today. He is overdue for fasting lab work including a fasting lipid panel. His goal LDL cholesterol is less than 70 given his history of CVA. He is also due to recheck a TSH. Past Medical History  Diagnosis Date  . Dyslipidemia   . Seizure disorder ([place])   . Hypothyroidism   . DDD (degenerative disc disease)   . Hx MRSA infection     left buttocks abscess  . Rotator cuff injury     chronic rotator cuff injury status post repair  . Hypertension   . Syncope   . Meniere's disease     Status post shunt  . BPH (benign prostatic  hyperplasia)   . Diabetes mellitus without complication ([place])   . Hyperlipidemia   . Coronary artery disease     a. s/p MI tx with Cypher DES to pRCA in 4/04;  b. Echocardiogram 7/09: Normal LV function.  c. Nuclear study 3/13 no ischemia;  d. ETT 2/14 neg;  e. admx with CP => LHC (01/30/2013):  pLAD 40-50%, oD1 70-80%, oOM1 40%, pRCA stent patent, mid RCA 30%, EF 60-65%. => med Rx.  . Stroke [facility])     a. 01/2013=> post cardiac cath CVA to L post communicating artery system; R sided weakness  . Hx of echocardiogram     a. Echocardiogram (01/31/2013): Mild focal basal and mild concentric hypertrophy of the septum, EF 50-55%, normal wall motion, grade 1 diastolic dysfunction, trivial AI, MAC, mild LAE, PASP 35  . Occlusion and stenosis of carotid artery without mention of cerebral infarction     40-59% on carotid doppler 2014; [place] (01/2013): R 1-39%, L 60-79%  . Myocardial infarction [facility])    Past Surgical History  Procedure Laterality Date  . Coronary angioplasty with stent placement  07/14/2002    Stent to the right coronary artery  . Rotator cuff repair      for chronic rotator cuff injury  . Hernia repair  04/10/2008    scrotal hernia repair  . Endolymphatic shunt decompression  06/11/2009     Right endolymphatic sac decompression and shunt  placement  . Colonoscopy    . Left heart catheterization with coronary angiogram  N/A 01/30/2013    Procedure: LEFT HEART CATHETERIZATION WITH CORONARY ANGIOGRAM;  Surgeon: Larey Dresser, MD;  Location: [facility] CATH LAB;  Service: Cardiovascular;  Laterality: N/A;   Current Outpatient Prescriptions on File Prior to Visit  Medication Sig Dispense Refill  . atorvastatin (LIPITOR) 80 MG tablet TAKE 1 TABLET BY MOUTH EVERY DAY 90 tablet 3  . carbamazepine (CARBATROL) 300 MG 12 hr capsule TAKE ONE CAPSULE BY MOUTH TWICE A DAY 60 capsule 11  . cloNIDine (CATAPRES) 0.1 MG tablet TAKE 1 TABLET TWICE A DAY 60 tablet 6  . clopidogrel (PLAVIX) 75 MG tablet TAKE  1 TABLET BY MOUTH EVERY DAY 30 tablet 11  . colestipol (COLESTID) 1 G tablet Take 1 tablet (1 g total) by mouth 2 (two) times daily. 60 tablet 1  . diazepam (VALIUM) 2 MG tablet TAKE 1 TABLET TWICE A DAY AS NEEDED 60 tablet 2  . hydrochlorothiazide (HYDRODIURIL) 25 MG tablet TAKE 1 TABLET EVERY DAY 30 tablet 11  . isosorbide mononitrate (IMDUR) 30 MG 24 hr tablet TAKE 1 TABLET BY MOUTH EVERY DAY 90 tablet 1  . levothyroxine (SYNTHROID, LEVOTHROID) 25 MCG tablet TAKE 2 TABLETS BY MOUTH DAILY BEFORE BREAKFAST 30 tablet 11  . losartan (COZAAR) 100 MG tablet TAKE 1/2 TABLET BY MOUTH 2 TIMES A DAY 30 tablet 1  . metFORMIN (GLUCOPHAGE) 500 MG tablet TAKE 1 TABLET TWICE A DAY WITH A MEAL 60 tablet 10  . metoprolol (LOPRESSOR) 50 MG tablet TAKE 1 TABLET BY MOUTH TWICE A DAY 60 tablet 11  . Multiple Vitamins-Minerals (MULTIVITAMINS THER. W/MINERALS) TABS Take 1 tablet by mouth at bedtime.      Marland Kitchen NITROSTAT 0.4 MG SL tablet PLACE 1 TABLET UNDER TONGUE EVERY 5 MINS AS NEEDED FOR CHEST PAIN 25 tablet 0  . pantoprazole (PROTONIX) 40 MG tablet TAKE 1 TABLET BY MOUTH EVERY DAY 90 tablet 4  . rosuvastatin (CRESTOR) 40 MG tablet Take 1 tablet (40 mg total) by mouth daily. 90 tablet 3  . silodosin (RAPAFLO) 8 MG CAPS capsule Take 1 capsule (8 mg total) by mouth daily with breakfast. 30 capsule 5   Current Facility-Administered Medications on File Prior to Visit  Medication Dose Route Frequency Provider Last Rate Last Dose  . testosterone cypionate (DEPOTESTOTERONE CYPIONATE) injection 200 mg  200 mg Intramuscular Q14 Days [name], MD   200 mg at 01/22/14 1244   No Known Allergies Social History   Social History  . Marital Status: Married    Spouse Name: N/A  . Number of Children: 4  . Years of Education: 12th   Occupational History  . Retired    Social History Main Topics  . Smoking status: Former Smoker    Quit date: 08/19/1961  . Smokeless tobacco: Former Systems developer    Types: Chew  . Alcohol  Use: No  . Drug Use: No  . Sexual Activity: No   Other Topics Concern  . Not on file   Social History Narrative   Lives with wife.   Family History  Problem Relation Age of Onset  . Heart attack Father 66  . Aneurysm Mother     brain aneurysm  . Coronary artery disease Brother     with CABG  . Diabetes Brother   . Colon cancer Neg Hx   . Colon polyps Neg Hx       Review of Systems  All other systems reviewed and are negative.      Objective:   Physical  Exam  Constitutional: He is oriented to person, place, and time. He appears well-developed and well-nourished. No distress.  HENT:  Head: Normocephalic and atraumatic.  Right Ear: External ear normal.  Left Ear: External ear normal.  Nose: Nose normal.  Mouth/Throat: Oropharynx is clear and moist. No oropharyngeal exudate.  Eyes: Conjunctivae and EOM are normal. Pupils are equal, round, and reactive to light. Right eye exhibits no discharge. Left eye exhibits no discharge. No scleral icterus.  Neck: Neck supple. No JVD present. No tracheal deviation present. No thyromegaly present.  Cardiovascular: Normal rate, regular rhythm, normal heart sounds and intact distal pulses.  Exam reveals no gallop and no friction rub.   No murmur heard. Pulmonary/Chest: Effort normal and breath sounds normal. No stridor. No respiratory distress. He has no wheezes. He has no rales. He exhibits no tenderness.  Abdominal: Soft. Bowel sounds are normal. He exhibits no distension and no mass. There is no tenderness. There is no rebound and no guarding.  Genitourinary: Rectum normal, prostate normal and penis normal.  Musculoskeletal: Normal range of motion. He exhibits no edema or tenderness.  Lymphadenopathy:    He has no cervical adenopathy.  Neurological: He is alert and oriented to person, place, and time. He has normal reflexes. He displays normal reflexes. No cranial nerve deficit. He exhibits normal muscle tone. Coordination normal.    Skin: Skin is warm. No rash noted. He is not diaphoretic. No erythema. No pallor.  Psychiatric: He has a normal mood and affect. His behavior is normal. Judgment and thought content normal.  Vitals reviewed.         Assessment & Plan:  Routine general medical examination at a health care facility - Plan: PSA  Essential hypertension  History of CVA (cerebrovascular accident)  Other specified hypothyroidism - Plan: TSH  Controlled type 2 diabetes mellitus without complication, without long-term current use of insulin ([place]) - Plan: CBC with Differential/Platelet, COMPLETE METABOLIC PANEL WITH GFR, Hemoglobin A1c, Microalbumin, urine, Ambulatory referral to Ophthalmology  HLD (hyperlipidemia)  Exam is normal. I will check a PSA to screen for prostate cancer. Colonoscopy is up-to-date. Blood pressure is acceptable. Check a fasting lipid panel. Goal LDL cholesterol is less than 70. Check a TSH. I will also check a urine microalbumin as well as a hemoglobin A1c. I will schedule the patient for an ophthalmology consultation for diabetic eye exam. Cancer screening is up-to-date. I did recommend the shingles vaccine.

## 2015-07-24 NOTE — ED Notes (Signed)
75 mcg of Fentanyl wasted in sink with Barbette Or rn

## 2015-07-24 NOTE — ED Provider Notes (Signed)
CSN: [id number]     Arrival date & time 07/24/15  1509 History   First MD Initiated Contact with Patient 07/24/15 1535     Chief Complaint  Patient presents with  . Fall  . Chest Injury     (Consider location/radiation/quality/duration/timing/severity/associated sxs/prior Treatment) HPI  Patient presents after a fall. The patient was on a riding lawnmower. Patient stood up, and fell. Repeat CT. He denies loss of consciousness. He fell onto his front side, and since that time has had pain diffusely across the chest, but prominently in the right inframammary area. Pain is sore, moderate, worse with very deep inspiration, otherwise not changed with respiratory effort. No lightheadedness, no syncope, no other chest pain, no vomiting, no confusion, disorientation. Patient is on Plavix. He acknowledges moderate neck soreness, no focal neck pain. Patient has a history of right shoulder issues, this is more painful as well following a fall, with no distal changes, including weakness or pain. He has hesitancy to move the right shoulder secondary to pain.    Past Medical History  Diagnosis Date  . Dyslipidemia   . Seizure disorder ([place])   . Hypothyroidism   . DDD (degenerative disc disease)   . Hx MRSA infection     left buttocks abscess  . Rotator cuff injury     chronic rotator cuff injury status post repair  . Hypertension   . Syncope   . Meniere's disease     Status post shunt  . BPH (benign prostatic hyperplasia)   . Diabetes mellitus without complication ([place])   . Hyperlipidemia   . Coronary artery disease     a. s/p MI tx with Cypher DES to pRCA in 4/04;  b. Echocardiogram 7/09: Normal LV function.  c. Nuclear study 3/13 no ischemia;  d. ETT 2/14 neg;  e. admx with CP => LHC (01/30/2013):  pLAD 40-50%, oD1 70-80%, oOM1 40%, pRCA stent patent, mid RCA 30%, EF 60-65%. => med Rx.  . Stroke [facility])     a. 01/2013=> post cardiac cath CVA to L post communicating artery system; R  sided weakness  . Hx of echocardiogram     a. Echocardiogram (01/31/2013): Mild focal basal and mild concentric hypertrophy of the septum, EF 50-55%, normal wall motion, grade 1 diastolic dysfunction, trivial AI, MAC, mild LAE, PASP 35  . Occlusion and stenosis of carotid artery without mention of cerebral infarction     40-59% on carotid doppler 2014; [place] (01/2013): R 1-39%, L 60-79%  . Myocardial infarction [facility])    Past Surgical History  Procedure Laterality Date  . Coronary angioplasty with stent placement  07/14/2002    Stent to the right coronary artery  . Rotator cuff repair      for chronic rotator cuff injury  . Hernia repair  04/10/2008    scrotal hernia repair  . Endolymphatic shunt decompression  06/11/2009     Right endolymphatic sac decompression and shunt  placement  . Colonoscopy    . Left heart catheterization with coronary angiogram N/A 01/30/2013    Procedure: LEFT HEART CATHETERIZATION WITH CORONARY ANGIOGRAM;  Surgeon: Larey Dresser, MD;  Location: [facility] CATH LAB;  Service: Cardiovascular;  Laterality: N/A;   Family History  Problem Relation Age of Onset  . Heart attack Father 41  . Aneurysm Mother     brain aneurysm  . Coronary artery disease Brother     with CABG  . Diabetes Brother   . Colon cancer Neg Hx   .  Colon polyps Neg Hx    Social History  Substance Use Topics  . Smoking status: Former Smoker    Quit date: 08/19/1961  . Smokeless tobacco: Former Systems developer    Types: Chew  . Alcohol Use: No    Review of Systems  Constitutional:       Per HPI, otherwise negative  HENT:       Per HPI, otherwise negative  Respiratory:       Per HPI, otherwise negative  Cardiovascular:       Per HPI, otherwise negative  Gastrointestinal: Negative for vomiting.  Endocrine:       Negative aside from HPI  Genitourinary:       Neg aside from HPI   Musculoskeletal:       Per HPI, otherwise negative  Skin: Negative for wound.  Neurological: Negative for syncope.        Allergies  Review of patient's allergies indicates no known allergies.  Home Medications   Prior to Admission medications   Medication Sig Start Date End Date Taking? Authorizing Provider  atorvastatin (LIPITOR) 80 MG tablet TAKE 1 TABLET BY MOUTH EVERY DAY 06/23/15   [name], MD  carbamazepine (CARBATROL) 300 MG 12 hr capsule TAKE ONE CAPSULE BY MOUTH TWICE A DAY 06/23/15   [name], MD  cloNIDine (CATAPRES) 0.1 MG tablet TAKE 1 TABLET TWICE A DAY 06/17/15   [name], MD  clopidogrel (PLAVIX) 75 MG tablet TAKE 1 TABLET BY MOUTH EVERY DAY 03/05/15   [name], MD  colestipol (COLESTID) 1 G tablet Take 1 tablet (1 g total) by mouth 2 (two) times daily. 12/19/13   Lafayette Dragon, MD  diazepam (VALIUM) 2 MG tablet TAKE 1 TABLET TWICE A DAY AS NEEDED 07/15/15   [name], MD  hydrochlorothiazide (HYDRODIURIL) 25 MG tablet TAKE 1 TABLET EVERY DAY 06/23/15   [name], MD  isosorbide mononitrate (IMDUR) 30 MG 24 hr tablet TAKE 1 TABLET BY MOUTH EVERY DAY 07/22/15   [name], MD  levothyroxine (SYNTHROID, LEVOTHROID) 25 MCG tablet TAKE 2 TABLETS BY MOUTH DAILY BEFORE BREAKFAST 12/04/14   [name], MD  losartan (COZAAR) 100 MG tablet TAKE 1/2 TABLET BY MOUTH 2 TIMES A DAY 06/05/15   [name], MD  metFORMIN (GLUCOPHAGE) 500 MG tablet TAKE 1 TABLET TWICE A DAY WITH A MEAL 01/27/15   [name], MD  metoprolol (LOPRESSOR) 50 MG tablet TAKE 1 TABLET BY MOUTH TWICE A DAY 05/05/15   [name], MD  Multiple Vitamins-Minerals (MULTIVITAMINS THER. W/MINERALS) TABS Take 1 tablet by mouth at bedtime.      Historical Provider, MD  NITROSTAT 0.4 MG SL tablet PLACE 1 TABLET UNDER TONGUE EVERY 5 MINS AS NEEDED FOR CHEST PAIN 03/01/14   Minus Breeding, MD  pantoprazole (PROTONIX) 40 MG tablet TAKE 1 TABLET BY MOUTH EVERY DAY 07/15/15   [name], MD  rosuvastatin (CRESTOR) 40 MG tablet Take 1 tablet (40 mg total) by mouth daily. 06/14/14    [name], MD  silodosin (RAPAFLO) 8 MG CAPS capsule Take 1 capsule (8 mg total) by mouth daily with breakfast. 06/14/14   [name], MD   BP 139/82 mmHg  Pulse 83  Temp(Src) 98.4 F (36.9 C) (Oral)  Resp 16  SpO2 96% Physical Exam  Constitutional: He is oriented to person, place, and time. He appears well-developed. No distress.  HENT:  Head: Normocephalic and atraumatic.  Eyes: Conjunctivae and EOM are normal.  Neck: Neck supple.  No focal lesion, diffuse soreness  Cardiovascular: Normal rate and regular rhythm.   Pulmonary/Chest: Effort normal. No stridor. No respiratory distress.  Abdominal: He exhibits no distension.  Musculoskeletal: He exhibits no edema.       Right shoulder: He exhibits decreased range of motion, tenderness, bony tenderness and pain. He exhibits no swelling, no effusion, no crepitus and no deformity.       Arms: Neurological: He is alert and oriented to person, place, and time.  Skin: Skin is warm and dry.  Psychiatric: He has a normal mood and affect.  Nursing note and vitals reviewed.   ED Course  Procedures (including critical care time) Labs Review Labs Reviewed  BASIC METABOLIC PANEL - Abnormal; Notable for the following:    Chloride 97 (*)    Glucose, Bld 132 (*)    All other components within normal limits  CBC - Abnormal; Notable for the following:    WBC 12.0 (*)    RBC 4.21 (*)    Hemoglobin 12.5 (*)    HCT 38.7 (*)    All other components within normal limits  PROTIME-INR  URINALYSIS, ROUTINE W REFLEX MICROSCOPIC (NOT AT [facility])  Randolm Idol, ED    Imaging Review Dg Chest 2 View  07/24/2015  CLINICAL DATA:  Fall today outside in his lawn, landing on anterior chest and forehead; Chest pain on the right in rib area; Hx of CVA 3 years ago, with residual right sided weakness; Hurts to take a deep breath; Hx of MI 10 years ago with 1 cardiac stent placed. History of hypertension. Dry cough for several weeks. EXAM: CHEST   2 VIEW COMPARISON:  11/19/2013 FINDINGS: Lung volumes are relatively low. Lungs are clear. Cardiac silhouette is normal in size. Aorta is mildly uncoiled. No mediastinal or hilar masses or evidence of adenopathy. No pleural effusion or pneumothorax. Bony thorax is demineralized but intact. IMPRESSION: No active cardiopulmonary disease. Electronically Signed   By: Lajean Manes M.D.   On: 07/24/2015 15:55   Ct Head Wo Contrast  07/24/2015  CLINICAL DATA:  Fall cutting grass hitting head. EXAM: CT HEAD WITHOUT CONTRAST CT CERVICAL SPINE WITHOUT CONTRAST TECHNIQUE: Multidetector CT imaging of the head and cervical spine was performed following the standard protocol without intravenous contrast. Multiplanar CT image reconstructions of the cervical spine were also generated. COMPARISON:  02/02/2013 FINDINGS: CT HEAD FINDINGS Regional soft tissues appear normal. No radiopaque foreign body. No displaced calvarial fracture. Mild, likely age-appropriate atrophy with sulcal prominence centralized volume loss with commensurate ex vacuo dilatation the ventricular system. Scattered periventricular hypodensities, most conspicuous about the medial aspect of the occipital horn of the left lateral ventricle (16, series 4) compatible with microvascular ischemic disease. Lacunar infarct within the left thalamus (image 18 series 4). Given background parenchymal abnormalities, there is no CT evidence of superimposed acute large territory infarct. No intraparenchymal or extra-axial mass or hemorrhage. Unchanged size and configuration the ventricles and basilar cisterns. No midline shift. Limited visualization the paranasal sinuses is normal. Postsurgical change of the right mastoid air cells. Normal appearance of the left mastoid air cells. No air-fluid levels. CT CERVICAL SPINE FINDINGS C1 to the superior endplate of T2 is imaged. Normal alignment of the cervical spine. No anterolisthesis or retrolisthesis. The dens is normally  positioned and a lateral masses of C1. Moderate degenerative change of the atlantodental articulation. Normal atlantoaxial articulations. The bilateral facets that is are normally aligned. No  fracture or static subluxation of the cervical spine. Cervical vertebral body heights are preserved. Prevertebral soft tissues are normal. Moderate to severe multilevel cervical spine DDD, worst C5-C6 and C6-C7 with disc space height loss, endplate irregularity and sclerosis. There is suspected partial ankylosis of the C5-C6 intervertebral disc space. There is partial ossification the nuchal ligament posterior to the C7 spinous process. Calcified atherosclerotic plaque within the bilateral carotid bulbs. No bulky cervical lymphadenopathy on this noncontrast examination. Normal noncontrast appearance of the thyroid gland. Limited visualization of lung apices is normal. IMPRESSION: 1. Similar findings of atrophy and microvascular ischemic disease without acute intracranial process. 2. No fracture or static subluxation of the cervical spine. 3. Moderate severe multilevel cervical spine DDD. 4. Atherosclerotic plaque when the bilateral carotid bulbs. Further evaluation with nonemergent carotid Doppler ultrasound could be performed as clinically indicated. Electronically Signed   By: Sandi Mariscal M.D.   On: 07/24/2015 16:42   Ct Cervical Spine Wo Contrast  07/24/2015  CLINICAL DATA:  Fall cutting grass hitting head. EXAM: CT HEAD WITHOUT CONTRAST CT CERVICAL SPINE WITHOUT CONTRAST TECHNIQUE: Multidetector CT imaging of the head and cervical spine was performed following the standard protocol without intravenous contrast. Multiplanar CT image reconstructions of the cervical spine were also generated. COMPARISON:  02/02/2013 FINDINGS: CT HEAD FINDINGS Regional soft tissues appear normal. No radiopaque foreign body. No displaced calvarial fracture. Mild, likely age-appropriate atrophy with sulcal prominence centralized volume loss  with commensurate ex vacuo dilatation the ventricular system. Scattered periventricular hypodensities, most conspicuous about the medial aspect of the occipital horn of the left lateral ventricle (16, series 4) compatible with microvascular ischemic disease. Lacunar infarct within the left thalamus (image 18 series 4). Given background parenchymal abnormalities, there is no CT evidence of superimposed acute large territory infarct. No intraparenchymal or extra-axial mass or hemorrhage. Unchanged size and configuration the ventricles and basilar cisterns. No midline shift. Limited visualization the paranasal sinuses is normal. Postsurgical change of the right mastoid air cells. Normal appearance of the left mastoid air cells. No air-fluid levels. CT CERVICAL SPINE FINDINGS C1 to the superior endplate of T2 is imaged. Normal alignment of the cervical spine. No anterolisthesis or retrolisthesis. The dens is normally positioned and a lateral masses of C1. Moderate degenerative change of the atlantodental articulation. Normal atlantoaxial articulations. The bilateral facets that is are normally aligned. No fracture or static subluxation of the cervical spine. Cervical vertebral body heights are preserved. Prevertebral soft tissues are normal. Moderate to severe multilevel cervical spine DDD, worst C5-C6 and C6-C7 with disc space height loss, endplate irregularity and sclerosis. There is suspected partial ankylosis of the C5-C6 intervertebral disc space. There is partial ossification the nuchal ligament posterior to the C7 spinous process. Calcified atherosclerotic plaque within the bilateral carotid bulbs. No bulky cervical lymphadenopathy on this noncontrast examination. Normal noncontrast appearance of the thyroid gland. Limited visualization of lung apices is normal. IMPRESSION: 1. Similar findings of atrophy and microvascular ischemic disease without acute intracranial process. 2. No fracture or static subluxation of  the cervical spine. 3. Moderate severe multilevel cervical spine DDD. 4. Atherosclerotic plaque when the bilateral carotid bulbs. Further evaluation with nonemergent carotid Doppler ultrasound could be performed as clinically indicated. Electronically Signed   By: Sandi Mariscal M.D.   On: 07/24/2015 16:42   I have personally reviewed and evaluated these images and lab results as part of my medical decision-making.   EKG Interpretation   Date/Time:  Thursday Jul 24 2015 15:16:41 EDT Ventricular Rate:  81 PR Interval:  198 QRS Duration: 134 QT Interval:  380 QTC Calculation: 441 R Axis:   -21 Text Interpretation:  Normal sinus rhythm Left ventricular hypertrophy  with QRS widening and repolarization abnormality Abnormal ECG Sinus rhythm  Left ventricular hypertrophy T wave abnormality Non-specific  intra-ventricular conduction delay Abnormal ekg Confirmed by Carmin Muskrat  MD [phone]) on 07/24/2015 3:54:31 PM     8:17 PM Patient in no distress on repeat exam. I had a very lengthy conversation with the patient and multiple family members about his medications, today's fall, need to follow-up with primary care for consideration of medication adjustments. Pain is currently minimal. Patient will receive incentive spirometer given some concern for rib contusion versus occult fracture.  MDM  Impression presents after a fall with pain in multiple areas. Most prominently, the patient has pain in his right rib cage. No evidence for pulmonary contusion on x-ray, no hypoxia. However, there is some suspicion for bruise versus contusion, and the patient was started on incentive spirometry, short course of pain medication. Patient discharged in stable condition to follow-up with primary care.   Carmin Muskrat, MD 07/24/15 2018

## 2015-07-24 NOTE — ED Notes (Signed)
Pt reports he was mowing grass, got up from the riding lawn mower and tripped over an unknown object and reports he fell forward and landed on his chest and he reports chest pain and he reports he hit his head. Pt is on plavix.

## 2015-07-25 LAB — MICROALBUMIN, URINE: Microalb, Ur: 1.1 mg/dL

## 2015-07-25 LAB — PSA: PSA: 5.02 ng/mL — ABNORMAL HIGH (ref <=4.00)

## 2015-07-29 ENCOUNTER — Encounter: Payer: Self-pay | Admitting: *Deleted

## 2015-07-29 ENCOUNTER — Encounter (HOSPITAL_COMMUNITY): Payer: Self-pay

## 2015-07-29 ENCOUNTER — Emergency Department (HOSPITAL_COMMUNITY)
Admission: EM | Admit: 2015-07-29 | Discharge: 2015-07-29 | Disposition: A | Discharge status: Home / Self Care | Payer: Medicare Other | Attending: Emergency Medicine | Admitting: Emergency Medicine

## 2015-07-29 ENCOUNTER — Telehealth: Payer: Self-pay | Admitting: Family Medicine

## 2015-07-29 DIAGNOSIS — Z7902 Long term (current) use of antithrombotics/antiplatelets: Secondary | ICD-10-CM | POA: Diagnosis not present

## 2015-07-29 DIAGNOSIS — Y9389 Activity, other specified: Secondary | ICD-10-CM | POA: Insufficient documentation

## 2015-07-29 DIAGNOSIS — Z8673 Personal history of transient ischemic attack (TIA), and cerebral infarction without residual deficits: Secondary | ICD-10-CM | POA: Insufficient documentation

## 2015-07-29 DIAGNOSIS — Y9289 Other specified places as the place of occurrence of the external cause: Secondary | ICD-10-CM | POA: Diagnosis not present

## 2015-07-29 DIAGNOSIS — M79641 Pain in right hand: Secondary | ICD-10-CM | POA: Diagnosis present

## 2015-07-29 DIAGNOSIS — W1839XA Other fall on same level, initial encounter: Secondary | ICD-10-CM | POA: Insufficient documentation

## 2015-07-29 DIAGNOSIS — Z9889 Other specified postprocedural states: Secondary | ICD-10-CM | POA: Diagnosis not present

## 2015-07-29 DIAGNOSIS — Z8614 Personal history of Methicillin resistant Staphylococcus aureus infection: Secondary | ICD-10-CM | POA: Diagnosis not present

## 2015-07-29 DIAGNOSIS — M25462 Effusion, left knee: Secondary | ICD-10-CM | POA: Diagnosis not present

## 2015-07-29 DIAGNOSIS — I10 Essential (primary) hypertension: Secondary | ICD-10-CM | POA: Insufficient documentation

## 2015-07-29 DIAGNOSIS — Z79899 Other long term (current) drug therapy: Secondary | ICD-10-CM | POA: Diagnosis not present

## 2015-07-29 DIAGNOSIS — S40021A Contusion of right upper arm, initial encounter: Secondary | ICD-10-CM | POA: Insufficient documentation

## 2015-07-29 DIAGNOSIS — E039 Hypothyroidism, unspecified: Secondary | ICD-10-CM | POA: Insufficient documentation

## 2015-07-29 DIAGNOSIS — T07XXXA Unspecified multiple injuries, initial encounter: Secondary | ICD-10-CM

## 2015-07-29 DIAGNOSIS — Z7984 Long term (current) use of oral hypoglycemic drugs: Secondary | ICD-10-CM | POA: Diagnosis not present

## 2015-07-29 DIAGNOSIS — Z9861 Coronary angioplasty status: Secondary | ICD-10-CM | POA: Insufficient documentation

## 2015-07-29 DIAGNOSIS — I252 Old myocardial infarction: Secondary | ICD-10-CM | POA: Insufficient documentation

## 2015-07-29 DIAGNOSIS — I2510 Atherosclerotic heart disease of native coronary artery without angina pectoris: Secondary | ICD-10-CM | POA: Insufficient documentation

## 2015-07-29 DIAGNOSIS — Z87891 Personal history of nicotine dependence: Secondary | ICD-10-CM | POA: Insufficient documentation

## 2015-07-29 DIAGNOSIS — Z8719 Personal history of other diseases of the digestive system: Secondary | ICD-10-CM | POA: Insufficient documentation

## 2015-07-29 DIAGNOSIS — Y998 Other external cause status: Secondary | ICD-10-CM | POA: Insufficient documentation

## 2015-07-29 DIAGNOSIS — N400 Enlarged prostate without lower urinary tract symptoms: Secondary | ICD-10-CM | POA: Insufficient documentation

## 2015-07-29 DIAGNOSIS — E119 Type 2 diabetes mellitus without complications: Secondary | ICD-10-CM | POA: Insufficient documentation

## 2015-07-29 DIAGNOSIS — E785 Hyperlipidemia, unspecified: Secondary | ICD-10-CM | POA: Diagnosis not present

## 2015-07-29 DIAGNOSIS — Z8669 Personal history of other diseases of the nervous system and sense organs: Secondary | ICD-10-CM | POA: Insufficient documentation

## 2015-07-29 DIAGNOSIS — S20211A Contusion of right front wall of thorax, initial encounter: Secondary | ICD-10-CM | POA: Diagnosis not present

## 2015-07-29 MED ORDER — HYDROCODONE-ACETAMINOPHEN 5-325 MG PO TABS: 1.0000 | ORAL_TABLET | Freq: Four times a day (QID) | ORAL | Status: DC | PRN

## 2015-07-29 NOTE — ED Notes (Addendum)
Pt states he fell on Thursday and then on Friday noticed large bruising to his right upper arm and the pain is getting worse. Entire right upper arm is purple and his right breast is yellowish color and bruising as well

## 2015-07-29 NOTE — ED Provider Notes (Signed)
CSN: [id number]     Arrival date & time 07/29/15  1600 History   First MD Initiated Contact with Patient 07/29/15 1746     Chief Complaint  Patient presents with  . Arm Pain     (Consider location/radiation/quality/duration/timing/severity/associated sxs/prior Treatment) HPI   [name] is a 82 y.o. male who presents for evaluation of bruising and pain on right upper arm, with family concerned for "a blood clot". Patient denies shortness of breath, weakness, dizziness, nausea, vomiting, fever or chills. He has noticed some left knee pain, in the last couple of days and feels like hematocrit injured in the fall. He was injured in a fall 07/24/15, was evaluated in the ED that they with imaging, and treated symptomatically. Today, he called his doctor because of increasing pain not improving with tramadol, and a prescription for Norco was called in. He's been eating well. There are no other known modifying factors.   Past Medical History  Diagnosis Date  . Dyslipidemia   . Seizure disorder ([place])   . Hypothyroidism   . DDD (degenerative disc disease)   . Hx MRSA infection     left buttocks abscess  . Rotator cuff injury     chronic rotator cuff injury status post repair  . Hypertension   . Syncope   . Meniere's disease     Status post shunt  . BPH (benign prostatic hyperplasia)   . Diabetes mellitus without complication ([place])   . Hyperlipidemia   . Coronary artery disease     a. s/p MI tx with Cypher DES to pRCA in 4/04;  b. Echocardiogram 7/09: Normal LV function.  c. Nuclear study 3/13 no ischemia;  d. ETT 2/14 neg;  e. admx with CP => LHC (01/30/2013):  pLAD 40-50%, oD1 70-80%, oOM1 40%, pRCA stent patent, mid RCA 30%, EF 60-65%. => med Rx.  . Stroke [facility])     a. 01/2013=> post cardiac cath CVA to L post communicating artery system; R sided weakness  . Hx of echocardiogram     a. Echocardiogram (01/31/2013): Mild focal basal and mild concentric hypertrophy of the septum, EF  50-55%, normal wall motion, grade 1 diastolic dysfunction, trivial AI, MAC, mild LAE, PASP 35  . Occlusion and stenosis of carotid artery without mention of cerebral infarction     40-59% on carotid doppler 2014; [place] (01/2013): R 1-39%, L 60-79%  . Myocardial infarction ([place])   . Hemorrhoids    Past Surgical History  Procedure Laterality Date  . Coronary angioplasty with stent placement  07/14/2002    Stent to the right coronary artery  . Rotator cuff repair      for chronic rotator cuff injury  . Hernia repair  04/10/2008    scrotal hernia repair  . Endolymphatic shunt decompression  06/11/2009     Right endolymphatic sac decompression and shunt  placement  . Colonoscopy    . Left heart catheterization with coronary angiogram N/A 01/30/2013    Procedure: LEFT HEART CATHETERIZATION WITH CORONARY ANGIOGRAM;  Surgeon: Larey Dresser, MD;  Location: [facility] CATH LAB;  Service: Cardiovascular;  Laterality: N/A;   Family History  Problem Relation Age of Onset  . Heart attack Father 53  . Aneurysm Mother     brain aneurysm  . Coronary artery disease Brother     with CABG  . Diabetes Brother   . Colon cancer Neg Hx   . Colon polyps Neg Hx    Social History  Substance Use Topics  .  Smoking status: Former Smoker    Quit date: 08/19/1961  . Smokeless tobacco: Former Systems developer    Types: Chew  . Alcohol Use: No    Review of Systems  All other systems reviewed and are negative.     Allergies  Review of patient's allergies indicates no known allergies.  Home Medications   Prior to Admission medications   Medication Sig Start Date End Date Taking? Authorizing Provider  atorvastatin (LIPITOR) 80 MG tablet TAKE 1 TABLET BY MOUTH EVERY DAY 06/23/15   [name], MD  carbamazepine (CARBATROL) 300 MG 12 hr capsule TAKE ONE CAPSULE BY MOUTH TWICE A DAY 06/23/15   [name], MD  cloNIDine (CATAPRES) 0.1 MG tablet TAKE 1 TABLET TWICE A DAY 06/17/15   [name], MD  clopidogrel  (PLAVIX) 75 MG tablet TAKE 1 TABLET BY MOUTH EVERY DAY 03/05/15   [name], MD  colestipol (COLESTID) 1 G tablet Take 1 tablet (1 g total) by mouth 2 (two) times daily. 12/19/13   Lafayette Dragon, MD  diazepam (VALIUM) 2 MG tablet TAKE 1 TABLET TWICE A DAY AS NEEDED 07/15/15   [name], MD  hydrochlorothiazide (HYDRODIURIL) 25 MG tablet TAKE 1 TABLET EVERY DAY 06/23/15   [name], MD  HYDROcodone-acetaminophen (NORCO) 5-325 MG tablet Take 1 tablet by mouth every 6 (six) hours as needed for moderate pain. 07/29/15   [name], MD  isosorbide mononitrate (IMDUR) 30 MG 24 hr tablet TAKE 1 TABLET BY MOUTH EVERY DAY 07/22/15   [name], MD  levothyroxine (SYNTHROID, LEVOTHROID) 25 MCG tablet TAKE 2 TABLETS BY MOUTH DAILY BEFORE BREAKFAST 12/04/14   [name], MD  losartan (COZAAR) 100 MG tablet TAKE 1/2 TABLET BY MOUTH 2 TIMES A DAY 06/05/15   [name], MD  metFORMIN (GLUCOPHAGE) 500 MG tablet TAKE 1 TABLET TWICE A DAY WITH A MEAL 01/27/15   [name], MD  metoprolol (LOPRESSOR) 50 MG tablet TAKE 1 TABLET BY MOUTH TWICE A DAY 05/05/15   [name], MD  Multiple Vitamins-Minerals (MULTIVITAMINS THER. W/MINERALS) TABS Take 1 tablet by mouth at bedtime.      Historical Provider, MD  NITROSTAT 0.4 MG SL tablet PLACE 1 TABLET UNDER TONGUE EVERY 5 MINS AS NEEDED FOR CHEST PAIN 03/01/14   Minus Breeding, MD  pantoprazole (PROTONIX) 40 MG tablet TAKE 1 TABLET BY MOUTH EVERY DAY 07/15/15   [name], MD  rosuvastatin (CRESTOR) 40 MG tablet Take 1 tablet (40 mg total) by mouth daily. 06/14/14   [name], MD  silodosin (RAPAFLO) 8 MG CAPS capsule Take 1 capsule (8 mg total) by mouth daily with breakfast. 06/14/14   [name], MD  traMADol (ULTRAM) 50 MG tablet Take 1 tablet (50 mg total) by mouth every 6 (six) hours as needed. 07/24/15   Carmin Muskrat, MD   BP 154/76 mmHg  Pulse 77  Temp(Src) 98.6 F (37 C) (Oral)  Resp 16  SpO2  98% Physical Exam  Constitutional: He is oriented to person, place, and time. He appears well-developed.  Elderly, frail  HENT:  Head: Normocephalic and atraumatic.  Right Ear: External ear normal.  Left Ear: External ear normal.  Eyes: Conjunctivae and EOM are normal. Pupils are equal, round, and reactive to light.  Neck: Normal range of motion and phonation normal. Neck supple.  Cardiovascular: Normal rate, regular rhythm and normal heart sounds.   Pulmonary/Chest: Effort normal and breath  sounds normal. No respiratory distress. He has no wheezes. He exhibits no bony tenderness.  Musculoskeletal: Normal range of motion.  Moderate bruising, with some swelling of the right upper arm, anterior aspect. Decreased active range of motion and tenderness to palpation, right shoulder. Small area of bruising on the right anterior chest, over rib 5-6. No chest wall instability. No distal swelling in the right forearm, that would be concerning for a DVT. Left knee with small effusion, and pain with extension. No gross instability of the left knee.  Neurological: He is alert and oriented to person, place, and time. No cranial nerve deficit or sensory deficit. He exhibits normal muscle tone. Coordination normal.  Skin: Skin is warm, dry and intact.  Psychiatric: He has a normal mood and affect. His behavior is normal. Judgment and thought content normal.  Nursing note and vitals reviewed.   ED Course  Procedures (including critical care time)  Initial clinical impression- worsening pain, 5 days after fall, with prior imaging negative for fracture. Suspect rotator cuff injury, versus healing contusion. Doubt fracture. Doubt intravenous DVT.  Medications - No data to display  Patient Vitals for the past 24 hrs:  BP Temp Temp src Pulse Resp SpO2  07/29/15 1615 154/76 mmHg 98.6 F (37 C) Oral 77 16 98 %    6:37 PM Reevaluation with update and discussion. After initial assessment and treatment, an  updated evaluation reveals No change in clinical status. Findings discussed with patient, and family members, all questions answered. [place] Review Labs Reviewed - No data to display  Imaging Review No results found. I have personally reviewed and evaluated these images and lab results as part of my medical decision-making.   EKG Interpretation None      MDM   Final diagnoses:  Contusion, multiple sites    Contusion right arm and chest, possible right rib fracture. Possible right rotator cuff injury. Doubt DVT, unstable chest injury, or pulmonary contusion.   Nursing Notes Reviewed/ Care Coordinated Applicable Imaging Reviewed Interpretation of Laboratory Data incorporated into ED treatment  The patient appears reasonably screened and/or stabilized for discharge and I doubt any other medical condition or other [facility] requiring further screening, evaluation, or treatment in the ED at this time prior to discharge.  Plan: Home Medications- Norco as prescribed; Home Treatments- rest, heat, sling right arm; return here if the recommended treatment, does not improve the symptoms; Recommended follow up- PCP prn if not better in 1 week; consider MRI right shoulder.   Daleen Bo, MD 07/29/15 [phone]

## 2015-07-29 NOTE — Discharge Instructions (Signed)
Wear the sling on the right arm, for comfort.  Use heat on the sore areas of your right arm, right chest and left knee, several times each day, to improve comfort, and healing.

## 2015-07-29 NOTE — Telephone Encounter (Signed)
He could come by for norco 5/325 poq 6 hrs prn pain (20).

## 2015-07-29 NOTE — Telephone Encounter (Signed)
Prescription printed and patient made aware to come to office to pick up after 2pm on 07/29/2015.

## 2015-07-29 NOTE — Telephone Encounter (Signed)
Patients daughter kim calling regarding a fall that her dad had, went to er, and was prescribed tramadol, that seems to be keeping him up, they would like to know if another pain med can be called in if possible    [place] [employer] number, she said ask for her

## 2015-07-30 ENCOUNTER — Telehealth: Payer: Self-pay | Admitting: Family Medicine

## 2015-07-30 MED ORDER — OXYCODONE-ACETAMINOPHEN 7.5-325 MG PO TABS: 1.0000 | ORAL_TABLET | ORAL | Status: DC | PRN

## 2015-07-30 NOTE — Telephone Encounter (Signed)
RX printed, left up front and patient's daughter aware to pick up  

## 2015-07-30 NOTE — Telephone Encounter (Signed)
Pt's daughter called and states that pt is still in a lot of pain and would like to know if there is something else, stronger, for his pain?? She states that he was up all night and the rx for hydrocodone is not helping.  Please call York Ram - pt's daughter- [phone]

## 2015-07-30 NOTE — Telephone Encounter (Signed)
Can print Rx for Oxycod APAP 7.5mg /325   # 30 + 0

## 2015-07-30 NOTE — Addendum Note (Signed)
Addended by: Shary Decamp B on: 07/30/2015 02:32 PM   Modules accepted: Orders

## 2015-08-01 ENCOUNTER — Ambulatory Visit: Payer: Medicare Other | Admitting: Internal Medicine

## 2015-08-01 ENCOUNTER — Ambulatory Visit (INDEPENDENT_AMBULATORY_CARE_PROVIDER_SITE_OTHER): Payer: Medicare Other | Admitting: Internal Medicine

## 2015-08-01 ENCOUNTER — Encounter: Payer: Self-pay | Admitting: Internal Medicine

## 2015-08-01 VITALS — BP 122/60 | HR 88 | Ht 66.25 in

## 2015-08-01 DIAGNOSIS — K591 Functional diarrhea: Secondary | ICD-10-CM

## 2015-08-01 DIAGNOSIS — K5909 Other constipation: Secondary | ICD-10-CM | POA: Diagnosis not present

## 2015-08-01 NOTE — Progress Notes (Signed)
HISTORY OF PRESENT ILLNESS:  [name] is a 82 y.o. male with multiple significant medical problems as listed below on chronic anticoagulation who is self-referred (daughter) regarding chronic loose bowel habits. Patient did undergo complete colonoscopy with Dr. Olevia Perches September 2015 for follow-up screening and to evaluate problems with diarrhea. Was on Plavix at that time. Examination was unremarkable except for small internal hemorrhoids. Random colon biopsies revealed minimal melanosis coli but were otherwise normal. Patient reports that since his stroke 2-1/2 years ago he will have issues with loose stools 2 or 3 days per week. In between more regular bowel habits. He states that he has been on metformin since that time. Recently fell with significant trauma to his right upper extremity and right side. Given hydrocodone which has caused constipation. Family is asking about his constipation and the use of hydrocodone. Due to his recent injury he is having difficulty sleeping. Patient has had rare incontinent episodes. No bleeding or other abnormalities. His functional status has declined in recent years. He is a Company secretary. His CVA has left him with numbness in his right side  REVIEW OF SYSTEMS:  All non-GI ROS negative except for sinus allergy, anxiety, cough, depression, sleeping problems, excessive urination, right arm pain  Past Medical History  Diagnosis Date  . Dyslipidemia   . Seizure disorder ([place])   . Hypothyroidism   . DDD (degenerative disc disease)   . Hx MRSA infection     left buttocks abscess  . Rotator cuff injury     chronic rotator cuff injury status post repair  . Hypertension   . Syncope   . Meniere's disease     Status post shunt  . BPH (benign prostatic hyperplasia)   . Diabetes mellitus without complication ([place])   . Hyperlipidemia   . Coronary artery disease     a. s/p MI tx with Cypher DES to pRCA in 4/04;  b. Echocardiogram 7/09: Normal LV function.  c.  Nuclear study 3/13 no ischemia;  d. ETT 2/14 neg;  e. admx with CP => LHC (01/30/2013):  pLAD 40-50%, oD1 70-80%, oOM1 40%, pRCA stent patent, mid RCA 30%, EF 60-65%. => med Rx.  . Stroke [facility])     a. 01/2013=> post cardiac cath CVA to L post communicating artery system; R sided weakness  . Hx of echocardiogram     a. Echocardiogram (01/31/2013): Mild focal basal and mild concentric hypertrophy of the septum, EF 50-55%, normal wall motion, grade 1 diastolic dysfunction, trivial AI, MAC, mild LAE, PASP 35  . Occlusion and stenosis of carotid artery without mention of cerebral infarction     40-59% on carotid doppler 2014; [place] (01/2013): R 1-39%, L 60-79%  . Myocardial infarction ([place]) 2008  . Hemorrhoids     Past Surgical History  Procedure Laterality Date  . Coronary angioplasty with stent placement  07/14/2002    Stent to the right coronary artery  . Rotator cuff repair      for chronic rotator cuff injury  . Hernia repair  04/10/2008    scrotal hernia repair  . Endolymphatic shunt decompression  06/11/2009     Right endolymphatic sac decompression and shunt  placement  . Colonoscopy    . Left heart catheterization with coronary angiogram N/A 01/30/2013    Procedure: LEFT HEART CATHETERIZATION WITH CORONARY ANGIOGRAM;  Surgeon: Larey Dresser, MD;  Location: [facility] CATH LAB;  Service: Cardiovascular;  Laterality: N/A;    Social History [name]  reports that he  quit smoking about 53 years ago. He has quit using smokeless tobacco. His smokeless tobacco use included Chew. He reports that he does not drink alcohol or use illicit drugs.  family history includes Aneurysm in his mother; Coronary artery disease in his brother; Diabetes in his brother; Heart attack (age of onset: 31) in his father. There is no history of Colon cancer or Colon polyps.  No Known Allergies   PHYSICAL EXAMINATION: Vital signs: BP 122/60 mmHg  Pulse 88  Ht 5' 6.25" (1.683 m)  Wt  (in wheelchair cannot  weigh) Constitutional: Chronically ill elderly gentleman somewhat uncomfortable moving his right arm, no acute distress Psychiatric: alert and oriented x3, cooperative Eyes: extraocular movements intact, anicteric, conjunctiva pink Mouth: oral pharynx moist, no lesions Neck: supple without thyromegaly Lymph: no lymphadenopathy Cardiovascular: heart regular rate and rhythm, no murmur Lungs: clear to auscultation bilaterally Abdomen: soft, obese, nontender, nondistended, no obvious ascites, no peritoneal signs, normal bowel sounds, no organomegaly Rectal: Omitted Extremities: Significant bruising in the right upper extremity. Lipoma-like lesion on the right forearm. no clubbing cyanosis or lower extremity edema bilaterally Skin: no lesions on visible extremities, except for bruising and ecchymoses Neuro: Cranial nerves intact.   ASSESSMENT:  #1. Intermittent loose stools. Colonoscopy in 2015 to evaluate was negative. Random colon biopsies showed mild melanosis #2. Narcotic related constipation #3. Multiple medical problems   PLAN:  #1. Recommended Metamucil 2 tablespoons daily. Hopefully this will regulate his bowels. For his narcotic related constipation, MiraLAX okay to use #2. If this does not help see PCP about holding metformin to see if this helps diarrhea #3. Protective undergarments for incontinence #4. GI follow-up when necessary  25 minutes was spent face-to-face with the patient. Greater than 50% a time was used for counseling regarding his abnormal bowel habits. Also answering multiple questions from the family

## 2015-08-01 NOTE — Patient Instructions (Signed)
If you are age 80 or older, your body mass index should be between 23-30. Your There is no weight on file to calculate BMI. If this is out of the aforementioned range listed, please consider follow up with your Primary Care Provider.  If you are age 57 or younger, your body mass index should be between 19-25. Your There is no weight on file to calculate BMI. If this is out of the aformentioned range listed, please consider follow up with your Primary Care Provider.   Please start metamucil 2 tablespoons a day.  Follow up as needed. [phone]  Thank you for choosing [facility]   Dr Henrene Pastor

## 2015-08-05 DIAGNOSIS — M25562 Pain in left knee: Secondary | ICD-10-CM | POA: Diagnosis not present

## 2015-08-05 DIAGNOSIS — M25511 Pain in right shoulder: Secondary | ICD-10-CM | POA: Diagnosis not present

## 2015-08-11 ENCOUNTER — Encounter: Payer: Self-pay | Admitting: Family Medicine

## 2015-08-11 ENCOUNTER — Ambulatory Visit (INDEPENDENT_AMBULATORY_CARE_PROVIDER_SITE_OTHER): Payer: Medicare Other | Admitting: Family Medicine

## 2015-08-11 VITALS — BP 130/68 | HR 68 | Temp 97.7°F | Resp 14 | Ht 65.0 in | Wt 225.0 lb

## 2015-08-11 DIAGNOSIS — J209 Acute bronchitis, unspecified: Secondary | ICD-10-CM | POA: Diagnosis not present

## 2015-08-11 DIAGNOSIS — S20211D Contusion of right front wall of thorax, subsequent encounter: Secondary | ICD-10-CM

## 2015-08-11 MED ORDER — AZITHROMYCIN 250 MG PO TABS: ORAL_TABLET | ORAL | Status: DC

## 2015-08-11 MED ORDER — GUAIFENESIN-CODEINE 100-10 MG/5ML PO SOLN: 5.0000 mL | Freq: Four times a day (QID) | ORAL | Status: DC | PRN

## 2015-08-11 MED ORDER — ALBUTEROL SULFATE HFA 108 (90 BASE) MCG/ACT IN AERS: 2.0000 | INHALATION_SPRAY | RESPIRATORY_TRACT | Status: DC | PRN

## 2015-08-11 NOTE — Progress Notes (Signed)
Patient ID: [name], male   DOB: Jul 13, 1933, 82 y.o.   MRN: [id number]    Subjective:    Patient ID: [name], male    DOB: February 23, 1934, 82 y.o.   MRN: [id number]  Patient presents for Illness and S/P Fall Patient here with nonproductive cough shortness of breath wheezing worsened over the past 2 weeks. Cough is worse at night. He has not had any fever denies any chest pain. He is not using anything over-the-counter. He does have remote history of bronchitis. He is currently a nonsmoker. He also had a fall on May 4 he was seen in the emergency room twice he did not have any broken bones CT of head fairly unremarkable he still has some bruising but he noted a knot became up on his chest wall since the fall   Review Of Systems:  GEN- denies fatigue, fever, weight loss,weakness, recent illness HEENT- denies eye drainage, change in vision, nasal discharge, CVS- denies chest pain, palpitations RESP- + SOB,+ cough, +wheeze ABD- denies N/V, change in stools, abd pain GU- denies dysuria, hematuria, dribbling, incontinence MSK- denies joint pain, muscle aches, injury Neuro- denies headache, dizziness, syncope, seizure activity       Objective:    BP 130/68 mmHg  Pulse 68  Temp(Src) 97.7 F (36.5 C) (Oral)  Resp 14  Ht 5\' 5"  (1.651 m)  Wt 225 lb (102.059 kg)  BMI 37.44 kg/m2  SpO2 97% GEN- NAD, alert and oriented x3 HEENT- PERRL, EOMI, non injected sclera, pink conjunctiva, MMM, oropharynx clear, nares clear, TM clear no effusion  Neck- Supple, no LAD CVS- RRR, no murmur RESP-mild rhonchi scattered wheeze, normal WOB, good air movement  Chest wall- bruising ecchomysosi extensive over right chest wall/breast area, small 2x2cm hematoma palpated, bruising of upper right arm extending to forearm and right lower abdominal region, side wall  ABD-NABS,soft,NT,ND EXT- No edema Pulses- Radial 2+        Assessment & Plan:      Problem List Items Addressed This Visit    None    Visit Diagnoses    Acute bronchitis, unspecified organism    -  Primary    Treat for bronchitis, also with fall and contusion may not be taking deep breaths so at risk for PNA, given zpak, albuterol robitussin codiene    Hematoma, chest wall, right, subsequent encounter        small hemtoma beneath area of bruising, expect will resolve over next few weeks, fall on plavix/ASA       Note: This dictation was prepared with Dragon dictation along with smaller phrase technology. Any transcriptional errors that result from this process are unintentional.

## 2015-08-11 NOTE — Patient Instructions (Signed)
Take the antibiotics Inhaler for wheezing Cough medicine F/U as needed

## 2015-08-14 ENCOUNTER — Other Ambulatory Visit: Payer: Self-pay | Admitting: Family Medicine

## 2015-08-14 NOTE — Telephone Encounter (Signed)
Refill refused.   Patient needs to contact office.

## 2015-08-15 ENCOUNTER — Ambulatory Visit (INDEPENDENT_AMBULATORY_CARE_PROVIDER_SITE_OTHER): Payer: Medicare Other | Admitting: Family Medicine

## 2015-08-15 ENCOUNTER — Encounter: Payer: Self-pay | Admitting: Family Medicine

## 2015-08-15 ENCOUNTER — Ambulatory Visit
Admission: RE | Admit: 2015-08-15 | Discharge: 2015-08-15 | Disposition: A | Discharge status: Home / Self Care | Payer: Medicare Other | Source: Ambulatory Visit | Attending: Family Medicine | Admitting: Family Medicine

## 2015-08-15 VITALS — BP 136/68 | HR 74 | Temp 97.8°F | Resp 16 | Ht 65.0 in | Wt 228.0 lb

## 2015-08-15 DIAGNOSIS — R0600 Dyspnea, unspecified: Secondary | ICD-10-CM

## 2015-08-15 DIAGNOSIS — R0602 Shortness of breath: Secondary | ICD-10-CM | POA: Diagnosis not present

## 2015-08-15 DIAGNOSIS — R05 Cough: Secondary | ICD-10-CM | POA: Diagnosis not present

## 2015-08-15 MED ORDER — FUROSEMIDE 40 MG PO TABS: 40.0000 mg | ORAL_TABLET | Freq: Every day | ORAL | Status: DC

## 2015-08-15 NOTE — Progress Notes (Signed)
Subjective:    Patient ID: [name], male    DOB: March 28, 1933, 82 y.o.   MRN: [id number]  HPI Was seen 4 days ago by my partner and diagnosed with bronchitis.  Was given Z-Pak and albuterol. He is here today for follow-up  He states he is not coughing very much. But he still feels short of breath. This is particularly worse at night when he lies down. He has to sit up to sleep. His weight has gone up 8 pounds since his physical exam in early May. He states that he is not eating enough to explain them. He does have trace to +1 edema in both legs below the knees. On examination today he has faint bibasilar crackles.  Past Medical History  Diagnosis Date  . Dyslipidemia   . Seizure disorder ([place])   . Hypothyroidism   . DDD (degenerative disc disease)   . Hx MRSA infection     left buttocks abscess  . Rotator cuff injury     chronic rotator cuff injury status post repair  . Hypertension   . Syncope   . Meniere's disease     Status post shunt  . BPH (benign prostatic hyperplasia)   . Diabetes mellitus without complication ([place])   . Hyperlipidemia   . Coronary artery disease     a. s/p MI tx with Cypher DES to pRCA in 4/04;  b. Echocardiogram 7/09: Normal LV function.  c. Nuclear study 3/13 no ischemia;  d. ETT 2/14 neg;  e. admx with CP => LHC (01/30/2013):  pLAD 40-50%, oD1 70-80%, oOM1 40%, pRCA stent patent, mid RCA 30%, EF 60-65%. => med Rx.  . Stroke [facility])     a. 01/2013=> post cardiac cath CVA to L post communicating artery system; R sided weakness  . Hx of echocardiogram     a. Echocardiogram (01/31/2013): Mild focal basal and mild concentric hypertrophy of the septum, EF 50-55%, normal wall motion, grade 1 diastolic dysfunction, trivial AI, MAC, mild LAE, PASP 35  . Occlusion and stenosis of carotid artery without mention of cerebral infarction     40-59% on carotid doppler 2014; [place] (01/2013): R 1-39%, L 60-79%  . Myocardial infarction ([place]) 2008  . Hemorrhoids    Past  Surgical History  Procedure Laterality Date  . Coronary angioplasty with stent placement  07/14/2002    Stent to the right coronary artery  . Rotator cuff repair      for chronic rotator cuff injury  . Hernia repair  04/10/2008    scrotal hernia repair  . Endolymphatic shunt decompression  06/11/2009     Right endolymphatic sac decompression and shunt  placement  . Colonoscopy    . Left heart catheterization with coronary angiogram N/A 01/30/2013    Procedure: LEFT HEART CATHETERIZATION WITH CORONARY ANGIOGRAM;  Surgeon: Larey Dresser, MD;  Location: [facility] CATH LAB;  Service: Cardiovascular;  Laterality: N/A;   Current Outpatient Prescriptions on File Prior to Visit  Medication Sig Dispense Refill  . albuterol (PROVENTIL HFA;VENTOLIN HFA) 108 (90 Base) MCG/ACT inhaler Inhale 2 puffs into the lungs every 4 (four) hours as needed for wheezing or shortness of breath. 1 Inhaler 0  . atorvastatin (LIPITOR) 80 MG tablet TAKE 1 TABLET BY MOUTH EVERY DAY 90 tablet 3  . carbamazepine (CARBATROL) 300 MG 12 hr capsule TAKE ONE CAPSULE BY MOUTH TWICE A DAY 60 capsule 11  . cloNIDine (CATAPRES) 0.1 MG tablet TAKE 1 TABLET TWICE A DAY 60 tablet  6  . clopidogrel (PLAVIX) 75 MG tablet TAKE 1 TABLET BY MOUTH EVERY DAY 30 tablet 11  . diazepam (VALIUM) 2 MG tablet TAKE 1 TABLET TWICE A DAY AS NEEDED 60 tablet 2  . guaiFENesin-codeine 100-10 MG/5ML syrup Take 5 mLs by mouth every 6 (six) hours as needed. 180 mL 0  . hydrochlorothiazide (HYDRODIURIL) 25 MG tablet TAKE 1 TABLET EVERY DAY 30 tablet 11  . HYDROcodone-acetaminophen (NORCO) 5-325 MG tablet Take 1 tablet by mouth every 6 (six) hours as needed for moderate pain. 20 tablet 0  . isosorbide mononitrate (IMDUR) 30 MG 24 hr tablet TAKE 1 TABLET BY MOUTH EVERY DAY 90 tablet 1  . levothyroxine (SYNTHROID, LEVOTHROID) 25 MCG tablet TAKE 2 TABLETS BY MOUTH DAILY BEFORE BREAKFAST 30 tablet 11  . losartan (COZAAR) 100 MG tablet TAKE 1/2 TABLET BY MOUTH 2 TIMES  A DAY 30 tablet 1  . metFORMIN (GLUCOPHAGE) 500 MG tablet TAKE 1 TABLET TWICE A DAY WITH A MEAL 60 tablet 10  . metoprolol (LOPRESSOR) 50 MG tablet TAKE 1 TABLET BY MOUTH TWICE A DAY 60 tablet 11  . Multiple Vitamins-Minerals (MULTIVITAMINS THER. W/MINERALS) TABS Take 1 tablet by mouth at bedtime.      Marland Kitchen NITROSTAT 0.4 MG SL tablet PLACE 1 TABLET UNDER TONGUE EVERY 5 MINS AS NEEDED FOR CHEST PAIN 25 tablet 0  . oxyCODONE-acetaminophen (PERCOCET) 7.5-325 MG tablet Take 1 tablet by mouth every 4 (four) hours as needed for severe pain. 30 tablet 0  . pantoprazole (PROTONIX) 40 MG tablet TAKE 1 TABLET BY MOUTH EVERY DAY 90 tablet 4  . silodosin (RAPAFLO) 8 MG CAPS capsule Take 1 capsule (8 mg total) by mouth daily with breakfast. 30 capsule 5   No current facility-administered medications on file prior to visit.   No Known Allergies Social History   Social History  . Marital Status: Married    Spouse Name: N/A  . Number of Children: 4  . Years of Education: 12th   Occupational History  . Retired    Social History Main Topics  . Smoking status: Former Smoker    Quit date: 08/19/1961  . Smokeless tobacco: Former Systems developer    Types: Chew  . Alcohol Use: No  . Drug Use: No  . Sexual Activity: No   Other Topics Concern  . Not on file   Social History Narrative   Lives with wife.    Review of Systems  All other systems reviewed and are negative.      Objective:   Physical Exam  Constitutional: He appears well-developed and well-nourished. No distress.  HENT:  Right Ear: External ear normal.  Left Ear: External ear normal.  Nose: Nose normal.  Mouth/Throat: Oropharynx is clear and moist.  Eyes: Conjunctivae are normal.  Neck: Neck supple. No JVD present.  Cardiovascular: Normal rate, regular rhythm and normal heart sounds.   No murmur heard. Pulmonary/Chest: Effort normal and breath sounds normal. No respiratory distress. He has no wheezes. He has no rales.  Musculoskeletal:  He exhibits edema.  Skin: He is not diaphoretic.  Vitals reviewed.         Assessment & Plan:  Dyspnea - Plan: furosemide (LASIX) 40 MG tablet, DG Chest 2 View  I appreciate no wheezing. He denies any chest pain or hemoptysis or purulent sputum. He denies any fevers. Given his weight gain, his orthopnea, and his peripheral edema, I'm concerned that his shortness of breath and dyspnea may be due to pulmonary edema. I  want him to discontinue hydrochlorothiazide. Begin Lasix 40 mg daily. Recheck here first of next week and go to get a chest x-ray as soon as possible

## 2015-08-19 ENCOUNTER — Encounter: Payer: Medicare Other | Admitting: Family Medicine

## 2015-08-19 NOTE — Progress Notes (Signed)
Subjective:    Patient ID: [name], male    DOB: [birth year], 82 y.o.   MRN: [id number]  HPI 5/27 Was seen 4 days ago by my partner and diagnosed with bronchitis.  Was given Z-Pak and albuterol. He is here today for follow-up  He states he is not coughing very much. But he still feels short of breath. This is particularly worse at night when he lies down. He has to sit up to sleep. His weight has gone up 8 pounds since his physical exam in early May. He states that he is not eating enough to explain them. He does have trace to +1 edema in both legs below the knees. On examination today he has faint bibasilar crackles.  At that time, my plan was: I appreciate no wheezing. He denies any chest pain or hemoptysis or purulent sputum. He denies any fevers. Given his weight gain, his orthopnea, and his peripheral edema, I'm concerned that his shortness of breath and dyspnea may be due to pulmonary edema. I want him to discontinue hydrochlorothiazide. Begin Lasix 40 mg daily. Recheck here first of next week and go to get a chest x-ray as soon as possible  08/19/15   Past Medical History  Diagnosis Date  . Dyslipidemia   . Seizure disorder ([place])   . Hypothyroidism   . DDD (degenerative disc disease)   . Hx MRSA infection     left buttocks abscess  . Rotator cuff injury     chronic rotator cuff injury status post repair  . Hypertension   . Syncope   . Meniere's disease     Status post shunt  . BPH (benign prostatic hyperplasia)   . Diabetes mellitus without complication ([place])   . Hyperlipidemia   . Coronary artery disease     a. s/p MI tx with Cypher DES to pRCA in 4/04;  b. Echocardiogram 7/09: Normal LV function.  c. Nuclear study 3/13 no ischemia;  d. ETT 2/14 neg;  e. admx with CP => LHC (01/30/2013):  pLAD 40-50%, oD1 70-80%, oOM1 40%, pRCA stent patent, mid RCA 30%, EF 60-65%. => med Rx.  . Stroke [facility])     a. 01/2013=> post cardiac cath CVA to L post communicating artery  system; R sided weakness  . Hx of echocardiogram     a. Echocardiogram (01/31/2013): Mild focal basal and mild concentric hypertrophy of the septum, EF 50-55%, normal wall motion, grade 1 diastolic dysfunction, trivial AI, MAC, mild LAE, PASP 35  . Occlusion and stenosis of carotid artery without mention of cerebral infarction     40-59% on carotid doppler 2014; [place] (01/2013): R 1-39%, L 60-79%  . Myocardial infarction ([place]) 2008  . Hemorrhoids    Past Surgical History  Procedure Laterality Date  . Coronary angioplasty with stent placement  07/14/2002    Stent to the right coronary artery  . Rotator cuff repair      for chronic rotator cuff injury  . Hernia repair  04/10/2008    scrotal hernia repair  . Endolymphatic shunt decompression  06/11/2009     Right endolymphatic sac decompression and shunt  placement  . Colonoscopy    . Left heart catheterization with coronary angiogram N/A 01/30/2013    Procedure: LEFT HEART CATHETERIZATION WITH CORONARY ANGIOGRAM;  Surgeon: Larey Dresser, MD;  Location: [facility] CATH LAB;  Service: Cardiovascular;  Laterality: N/A;   Current Outpatient Prescriptions on File Prior to Visit  Medication Sig Dispense Refill  .  albuterol (PROVENTIL HFA;VENTOLIN HFA) 108 (90 Base) MCG/ACT inhaler Inhale 2 puffs into the lungs every 4 (four) hours as needed for wheezing or shortness of breath. 1 Inhaler 0  . atorvastatin (LIPITOR) 80 MG tablet TAKE 1 TABLET BY MOUTH EVERY DAY 90 tablet 3  . carbamazepine (CARBATROL) 300 MG 12 hr capsule TAKE ONE CAPSULE BY MOUTH TWICE A DAY 60 capsule 11  . cloNIDine (CATAPRES) 0.1 MG tablet TAKE 1 TABLET TWICE A DAY 60 tablet 6  . clopidogrel (PLAVIX) 75 MG tablet TAKE 1 TABLET BY MOUTH EVERY DAY 30 tablet 11  . diazepam (VALIUM) 2 MG tablet TAKE 1 TABLET TWICE A DAY AS NEEDED 60 tablet 2  . furosemide (LASIX) 40 MG tablet Take 1 tablet (40 mg total) by mouth daily. 30 tablet 0  . guaiFENesin-codeine 100-10 MG/5ML syrup Take 5 mLs by  mouth every 6 (six) hours as needed. 180 mL 0  . hydrochlorothiazide (HYDRODIURIL) 25 MG tablet TAKE 1 TABLET EVERY DAY 30 tablet 11  . HYDROcodone-acetaminophen (NORCO) 5-325 MG tablet Take 1 tablet by mouth every 6 (six) hours as needed for moderate pain. 20 tablet 0  . isosorbide mononitrate (IMDUR) 30 MG 24 hr tablet TAKE 1 TABLET BY MOUTH EVERY DAY 90 tablet 1  . levothyroxine (SYNTHROID, LEVOTHROID) 25 MCG tablet TAKE 2 TABLETS BY MOUTH DAILY BEFORE BREAKFAST 30 tablet 11  . losartan (COZAAR) 100 MG tablet TAKE 1/2 TABLET BY MOUTH 2 TIMES A DAY 30 tablet 1  . metFORMIN (GLUCOPHAGE) 500 MG tablet TAKE 1 TABLET TWICE A DAY WITH A MEAL 60 tablet 10  . metoprolol (LOPRESSOR) 50 MG tablet TAKE 1 TABLET BY MOUTH TWICE A DAY 60 tablet 11  . Multiple Vitamins-Minerals (MULTIVITAMINS THER. W/MINERALS) TABS Take 1 tablet by mouth at bedtime.      Marland Kitchen NITROSTAT 0.4 MG SL tablet PLACE 1 TABLET UNDER TONGUE EVERY 5 MINS AS NEEDED FOR CHEST PAIN 25 tablet 0  . oxyCODONE-acetaminophen (PERCOCET) 7.5-325 MG tablet Take 1 tablet by mouth every 4 (four) hours as needed for severe pain. 30 tablet 0  . pantoprazole (PROTONIX) 40 MG tablet TAKE 1 TABLET BY MOUTH EVERY DAY 90 tablet 4  . silodosin (RAPAFLO) 8 MG CAPS capsule Take 1 capsule (8 mg total) by mouth daily with breakfast. 30 capsule 5   No current facility-administered medications on file prior to visit.   No Known Allergies Social History   Social History  . Marital Status: Married    Spouse Name: N/A  . Number of Children: 4  . Years of Education: 12th   Occupational History  . Retired    Social History Main Topics  . Smoking status: Former Smoker    Quit date: 08/19/1961  . Smokeless tobacco: Former Systems developer    Types: Chew  . Alcohol Use: No  . Drug Use: No  . Sexual Activity: No   Other Topics Concern  . Not on file   Social History Narrative   Lives with wife.    Review of Systems  All other systems reviewed and are  negative.      Objective:   Physical Exam  Constitutional: He appears well-developed and well-nourished. No distress.  HENT:  Right Ear: External ear normal.  Left Ear: External ear normal.  Nose: Nose normal.  Mouth/Throat: Oropharynx is clear and moist.  Eyes: Conjunctivae are normal.  Neck: Neck supple. No JVD present.  Cardiovascular: Normal rate, regular rhythm and normal heart sounds.   No murmur heard.  Pulmonary/Chest: Effort normal and breath sounds normal. No respiratory distress. He has no wheezes. He has no rales.  Musculoskeletal: He exhibits edema.  Skin: He is not diaphoretic.  Vitals reviewed.         Assessment & Plan:   This encounter was created in error - please disregard.

## 2015-08-22 ENCOUNTER — Encounter: Payer: Self-pay | Admitting: Family Medicine

## 2015-08-25 DIAGNOSIS — M25511 Pain in right shoulder: Secondary | ICD-10-CM | POA: Diagnosis not present

## 2015-08-29 DIAGNOSIS — M25511 Pain in right shoulder: Secondary | ICD-10-CM | POA: Diagnosis not present

## 2015-09-02 ENCOUNTER — Ambulatory Visit (INDEPENDENT_AMBULATORY_CARE_PROVIDER_SITE_OTHER): Payer: Medicare Other | Admitting: Family Medicine

## 2015-09-02 ENCOUNTER — Encounter: Payer: Self-pay | Admitting: Family Medicine

## 2015-09-02 VITALS — BP 128/64 | HR 78 | Temp 98.2°F | Resp 18 | Ht 65.0 in | Wt 218.0 lb

## 2015-09-02 DIAGNOSIS — R0600 Dyspnea, unspecified: Secondary | ICD-10-CM | POA: Diagnosis not present

## 2015-09-02 NOTE — Progress Notes (Signed)
Subjective:    Patient ID: [name], male    DOB: July 01, 1933, 82 y.o.   MRN: [id number]  HPI 08/15/15 Was seen 4 days ago by my partner and diagnosed with bronchitis.  Was given Z-Pak and albuterol. He is here today for follow-up  He states he is not coughing very much. But he still feels short of breath. This is particularly worse at night when he lies down. He has to sit up to sleep. His weight has gone up 8 pounds since his physical exam in early May. He states that he is not eating enough to explain them. He does have trace to +1 edema in both legs below the knees. On examination today he has faint bibasilar crackles.  At that time, my plan was: I appreciate no wheezing. He denies any chest pain or hemoptysis or purulent sputum. He denies any fevers. Given his weight gain, his orthopnea, and his peripheral edema, I'm concerned that his shortness of breath and dyspnea may be due to pulmonary edema. I want him to discontinue hydrochlorothiazide. Begin Lasix 40 mg daily. Recheck here first of next week and go to get a chest x-ray as soon as possible  09/02/15 Patient in early May he weighed 220 pounds. At his last office visit he was 228 pounds. Today he is down to 218 pounds. He reports that his breathing has improved. He denies any further dyspnea or cough. He failed to keep his appointment 2 weeks ago and has been on Lasix 40 mg ever since. He denies any dizziness. However he does have some dry mouth. He denies any orthostatic hypotension  Past Medical History  Diagnosis Date  . Dyslipidemia   . Seizure disorder ([place])   . Hypothyroidism   . DDD (degenerative disc disease)   . Hx MRSA infection     left buttocks abscess  . Rotator cuff injury     chronic rotator cuff injury status post repair  . Hypertension   . Syncope   . Meniere's disease     Status post shunt  . BPH (benign prostatic hyperplasia)   . Diabetes mellitus without complication ([place])   . Hyperlipidemia   .  Coronary artery disease     a. s/p MI tx with Cypher DES to pRCA in 4/04;  b. Echocardiogram 7/09: Normal LV function.  c. Nuclear study 3/13 no ischemia;  d. ETT 2/14 neg;  e. admx with CP => LHC (01/30/2013):  pLAD 40-50%, oD1 70-80%, oOM1 40%, pRCA stent patent, mid RCA 30%, EF 60-65%. => med Rx.  . Stroke [facility])     a. 01/2013=> post cardiac cath CVA to L post communicating artery system; R sided weakness  . Hx of echocardiogram     a. Echocardiogram (01/31/2013): Mild focal basal and mild concentric hypertrophy of the septum, EF 50-55%, normal wall motion, grade 1 diastolic dysfunction, trivial AI, MAC, mild LAE, PASP 35  . Occlusion and stenosis of carotid artery without mention of cerebral infarction     40-59% on carotid doppler 2014; [place] (01/2013): R 1-39%, L 60-79%  . Myocardial infarction ([place]) 2008  . Hemorrhoids    Past Surgical History  Procedure Laterality Date  . Coronary angioplasty with stent placement  07/14/2002    Stent to the right coronary artery  . Rotator cuff repair      for chronic rotator cuff injury  . Hernia repair  04/10/2008    scrotal hernia repair  . Endolymphatic shunt decompression  06/11/2009  Right endolymphatic sac decompression and shunt  placement  . Colonoscopy    . Left heart catheterization with coronary angiogram N/A 01/30/2013    Procedure: LEFT HEART CATHETERIZATION WITH CORONARY ANGIOGRAM;  Surgeon: Larey Dresser, MD;  Location: [facility] CATH LAB;  Service: Cardiovascular;  Laterality: N/A;   Current Outpatient Prescriptions on File Prior to Visit  Medication Sig Dispense Refill  . atorvastatin (LIPITOR) 80 MG tablet TAKE 1 TABLET BY MOUTH EVERY DAY 90 tablet 3  . carbamazepine (CARBATROL) 300 MG 12 hr capsule TAKE ONE CAPSULE BY MOUTH TWICE A DAY 60 capsule 11  . cloNIDine (CATAPRES) 0.1 MG tablet TAKE 1 TABLET TWICE A DAY 60 tablet 6  . clopidogrel (PLAVIX) 75 MG tablet TAKE 1 TABLET BY MOUTH EVERY DAY 30 tablet 11  . diazepam (VALIUM) 2  MG tablet TAKE 1 TABLET TWICE A DAY AS NEEDED 60 tablet 2  . furosemide (LASIX) 40 MG tablet Take 1 tablet (40 mg total) by mouth daily. 30 tablet 0  . HYDROcodone-acetaminophen (NORCO) 5-325 MG tablet Take 1 tablet by mouth every 6 (six) hours as needed for moderate pain. 20 tablet 0  . isosorbide mononitrate (IMDUR) 30 MG 24 hr tablet TAKE 1 TABLET BY MOUTH EVERY DAY 90 tablet 1  . levothyroxine (SYNTHROID, LEVOTHROID) 25 MCG tablet TAKE 2 TABLETS BY MOUTH DAILY BEFORE BREAKFAST 30 tablet 11  . losartan (COZAAR) 100 MG tablet TAKE 1/2 TABLET BY MOUTH 2 TIMES A DAY 30 tablet 1  . metFORMIN (GLUCOPHAGE) 500 MG tablet TAKE 1 TABLET TWICE A DAY WITH A MEAL 60 tablet 10  . metoprolol (LOPRESSOR) 50 MG tablet TAKE 1 TABLET BY MOUTH TWICE A DAY 60 tablet 11  . Multiple Vitamins-Minerals (MULTIVITAMINS THER. W/MINERALS) TABS Take 1 tablet by mouth at bedtime.      Marland Kitchen NITROSTAT 0.4 MG SL tablet PLACE 1 TABLET UNDER TONGUE EVERY 5 MINS AS NEEDED FOR CHEST PAIN 25 tablet 0  . pantoprazole (PROTONIX) 40 MG tablet TAKE 1 TABLET BY MOUTH EVERY DAY 90 tablet 4  . silodosin (RAPAFLO) 8 MG CAPS capsule Take 1 capsule (8 mg total) by mouth daily with breakfast. 30 capsule 5  . albuterol (PROVENTIL HFA;VENTOLIN HFA) 108 (90 Base) MCG/ACT inhaler Inhale 2 puffs into the lungs every 4 (four) hours as needed for wheezing or shortness of breath. (Patient not taking: Reported on 09/02/2015) 1 Inhaler 0  . hydrochlorothiazide (HYDRODIURIL) 25 MG tablet TAKE 1 TABLET EVERY DAY (Patient not taking: Reported on 09/02/2015) 30 tablet 11  . oxyCODONE-acetaminophen (PERCOCET) 7.5-325 MG tablet Take 1 tablet by mouth every 4 (four) hours as needed for severe pain. (Patient not taking: Reported on 09/02/2015) 30 tablet 0   No current facility-administered medications on file prior to visit.   No Known Allergies Social History   Social History  . Marital Status: Married    Spouse Name: N/A  . Number of Children: 4  . Years of  Education: 12th   Occupational History  . Retired    Social History Main Topics  . Smoking status: Former Smoker    Quit date: 08/19/1961  . Smokeless tobacco: Former Systems developer    Types: Chew  . Alcohol Use: No  . Drug Use: No  . Sexual Activity: No   Other Topics Concern  . Not on file   Social History Narrative   Lives with wife.    Review of Systems  All other systems reviewed and are negative.      Objective:  Physical Exam  Constitutional: He appears well-developed and well-nourished. No distress.  HENT:  Right Ear: External ear normal.  Left Ear: External ear normal.  Nose: Nose normal.  Mouth/Throat: Oropharynx is clear and moist.  Eyes: Conjunctivae are normal.  Neck: Neck supple. No JVD present.  Cardiovascular: Normal rate, regular rhythm and normal heart sounds.   No murmur heard. Pulmonary/Chest: Effort normal and breath sounds normal. No respiratory distress. He has no wheezes. He has no rales.  Musculoskeletal: He exhibits no edema.  Skin: He is not diaphoretic.  Vitals reviewed.         Assessment & Plan:  Dyspnea  Dyspnea has resolved. However I want to avoid dehydration and I'm no longer believe he needs to continue Lasix. Discontinue Lasix and resume hydrochlorothiazide 25 mg by mouth daily for hypertension. If edema reaccumulation, I will proceed with an echocardiogram of the heart.

## 2015-09-11 ENCOUNTER — Ambulatory Visit: Payer: Medicare Other | Admitting: Internal Medicine

## 2015-09-16 DIAGNOSIS — H04123 Dry eye syndrome of bilateral lacrimal glands: Secondary | ICD-10-CM | POA: Diagnosis not present

## 2015-09-16 DIAGNOSIS — H26493 Other secondary cataract, bilateral: Secondary | ICD-10-CM | POA: Diagnosis not present

## 2015-09-16 DIAGNOSIS — H353132 Nonexudative age-related macular degeneration, bilateral, intermediate dry stage: Secondary | ICD-10-CM | POA: Diagnosis not present

## 2015-09-16 DIAGNOSIS — E119 Type 2 diabetes mellitus without complications: Secondary | ICD-10-CM | POA: Diagnosis not present

## 2015-09-16 DIAGNOSIS — H524 Presbyopia: Secondary | ICD-10-CM | POA: Diagnosis not present

## 2015-09-16 LAB — HM DIABETES EYE EXAM

## 2015-09-29 ENCOUNTER — Other Ambulatory Visit: Payer: Self-pay | Admitting: Family Medicine

## 2015-09-29 NOTE — Telephone Encounter (Signed)
Refill appropriate and filled per protocol. 

## 2015-10-06 ENCOUNTER — Encounter: Payer: Self-pay | Admitting: Family Medicine

## 2015-10-06 ENCOUNTER — Ambulatory Visit (INDEPENDENT_AMBULATORY_CARE_PROVIDER_SITE_OTHER): Payer: Medicare Other | Admitting: Family Medicine

## 2015-10-06 VITALS — BP 118/64 | HR 76 | Temp 97.9°F | Resp 14 | Ht 65.0 in | Wt 221.0 lb

## 2015-10-06 DIAGNOSIS — R601 Generalized edema: Secondary | ICD-10-CM

## 2015-10-06 DIAGNOSIS — I484 Atypical atrial flutter: Secondary | ICD-10-CM | POA: Diagnosis not present

## 2015-10-06 DIAGNOSIS — I499 Cardiac arrhythmia, unspecified: Secondary | ICD-10-CM

## 2015-10-06 DIAGNOSIS — R0600 Dyspnea, unspecified: Secondary | ICD-10-CM | POA: Diagnosis not present

## 2015-10-06 MED ORDER — FUROSEMIDE 40 MG PO TABS: 40.0000 mg | ORAL_TABLET | Freq: Every day | ORAL | Status: DC

## 2015-10-06 NOTE — Progress Notes (Signed)
Subjective:    Patient ID: [name], male    DOB: [birth year], 82 y.o.   MRN: [id number]  HPI  Please see my last office visit. Since that time, the patient has gained 3 pounds. He does have trace to +1 bipedal edema to the mid shin. He is concerned that he is retaining fluid. On examination today, he does have by basilar Rales. He denies any chest pain, shortness of breath, dyspnea on exertion, orthopnea. However on his examination today, his heart rate is irregularly irregular. It is difficult to tell if these are frequent PVCs versus true atrial fibrillation. Therefore I would like to obtain an EKG. Last echocardiogram of the heart was in 2014 and revealed an ejection fraction of 50-55%. Past Medical History  Diagnosis Date  . Dyslipidemia   . Seizure disorder ([place])   . Hypothyroidism   . DDD (degenerative disc disease)   . Hx MRSA infection     left buttocks abscess  . Rotator cuff injury     chronic rotator cuff injury status post repair  . Hypertension   . Syncope   . Meniere's disease     Status post shunt  . BPH (benign prostatic hyperplasia)   . Diabetes mellitus without complication ([place])   . Hyperlipidemia   . Coronary artery disease     a. s/p MI tx with Cypher DES to pRCA in 4/04;  b. Echocardiogram 7/09: Normal LV function.  c. Nuclear study 3/13 no ischemia;  d. ETT 2/14 neg;  e. admx with CP => LHC (01/30/2013):  pLAD 40-50%, oD1 70-80%, oOM1 40%, pRCA stent patent, mid RCA 30%, EF 60-65%. => med Rx.  . Stroke [facility])     a. 01/2013=> post cardiac cath CVA to L post communicating artery system; R sided weakness  . Hx of echocardiogram     a. Echocardiogram (01/31/2013): Mild focal basal and mild concentric hypertrophy of the septum, EF 50-55%, normal wall motion, grade 1 diastolic dysfunction, trivial AI, MAC, mild LAE, PASP 35  . Occlusion and stenosis of carotid artery without mention of cerebral infarction     40-59% on carotid doppler 2014; [place] (01/2013): R  1-39%, L 60-79%  . Myocardial infarction ([place]) 2008  . Hemorrhoids    Past Surgical History  Procedure Laterality Date  . Coronary angioplasty with stent placement  07/14/2002    Stent to the right coronary artery  . Rotator cuff repair      for chronic rotator cuff injury  . Hernia repair  04/10/2008    scrotal hernia repair  . Endolymphatic shunt decompression  06/11/2009     Right endolymphatic sac decompression and shunt  placement  . Colonoscopy    . Left heart catheterization with coronary angiogram N/A 01/30/2013    Procedure: LEFT HEART CATHETERIZATION WITH CORONARY ANGIOGRAM;  Surgeon: Larey Dresser, MD;  Location: [facility] CATH LAB;  Service: Cardiovascular;  Laterality: N/A;   Current Outpatient Prescriptions on File Prior to Visit  Medication Sig Dispense Refill  . atorvastatin (LIPITOR) 80 MG tablet TAKE 1 TABLET BY MOUTH EVERY DAY 90 tablet 3  . carbamazepine (CARBATROL) 300 MG 12 hr capsule TAKE ONE CAPSULE BY MOUTH TWICE A DAY 60 capsule 11  . cloNIDine (CATAPRES) 0.1 MG tablet TAKE 1 TABLET TWICE A DAY 60 tablet 6  . clopidogrel (PLAVIX) 75 MG tablet TAKE 1 TABLET BY MOUTH EVERY DAY 30 tablet 11  . diazepam (VALIUM) 2 MG tablet TAKE 1 TABLET TWICE A DAY  AS NEEDED 60 tablet 2  . isosorbide mononitrate (IMDUR) 30 MG 24 hr tablet TAKE 1 TABLET BY MOUTH EVERY DAY 90 tablet 1  . levothyroxine (SYNTHROID, LEVOTHROID) 25 MCG tablet TAKE 2 TABLETS BY MOUTH DAILY BEFORE BREAKFAST 30 tablet 11  . losartan (COZAAR) 100 MG tablet TAKE 1/2 TABLET BY MOUTH 2 TIMES A DAY 30 tablet 1  . metFORMIN (GLUCOPHAGE) 500 MG tablet TAKE 1 TABLET TWICE A DAY WITH A MEAL 60 tablet 10  . metoprolol (LOPRESSOR) 50 MG tablet TAKE 1 TABLET BY MOUTH TWICE A DAY 60 tablet 11  . Multiple Vitamins-Minerals (MULTIVITAMINS THER. W/MINERALS) TABS Take 1 tablet by mouth at bedtime.      Marland Kitchen NITROSTAT 0.4 MG SL tablet PLACE 1 TABLET UNDER TONGUE EVERY 5 MINS AS NEEDED FOR CHEST PAIN 25 tablet 0  . pantoprazole  (PROTONIX) 40 MG tablet TAKE 1 TABLET BY MOUTH EVERY DAY 90 tablet 4  . silodosin (RAPAFLO) 8 MG CAPS capsule Take 1 capsule (8 mg total) by mouth daily with breakfast. 30 capsule 5  . albuterol (PROVENTIL HFA;VENTOLIN HFA) 108 (90 Base) MCG/ACT inhaler Inhale 2 puffs into the lungs every 4 (four) hours as needed for wheezing or shortness of breath. (Patient not taking: Reported on 09/02/2015) 1 Inhaler 0  . furosemide (LASIX) 40 MG tablet Take 1 tablet (40 mg total) by mouth daily. (Patient not taking: Reported on 10/06/2015) 30 tablet 0   No current facility-administered medications on file prior to visit.   No Known Allergies Social History   Social History  . Marital Status: Married    Spouse Name: N/A  . Number of Children: 4  . Years of Education: 12th   Occupational History  . Retired    Social History Main Topics  . Smoking status: Former Smoker    Quit date: 08/19/1961  . Smokeless tobacco: Former Systems developer    Types: Chew  . Alcohol Use: No  . Drug Use: No  . Sexual Activity: No   Other Topics Concern  . Not on file   Social History Narrative   Lives with wife.    Review of Systems  All other systems reviewed and are negative.      Objective:   Physical Exam  Constitutional: He appears well-developed and well-nourished.  Neck: No JVD present.  Cardiovascular: Normal rate.  An irregularly irregular rhythm present.  No murmur heard. Pulmonary/Chest: Effort normal and breath sounds normal. No respiratory distress. He has no wheezes. He has no rales.  Abdominal: Soft. Bowel sounds are normal.  Musculoskeletal: He exhibits edema.  Vitals reviewed.        Assessment & Plan:  Generalized edema - Plan: ECHOCARDIOGRAM COMPLETE  Irregular heartbeat - Plan: ECHOCARDIOGRAM COMPLETE, EKG 12-Lead, Ambulatory referral to Cardiology  Dyspnea - Plan: furosemide (LASIX) 40 MG tablet, Ambulatory referral to Cardiology  Atypical atrial flutter ([place]) - Plan: Ambulatory  referral to Cardiology  EKG confirms an irregularly irregular rhythm. There are definite P waves. However pattern appear to be atrial flutter versus normal sinus rhythm with frequent PVCs. I believe this atrial flutter. Furthermore there are new ST changes in lead 1 that was not present compared to an EKG in May of this year. This is a significant change from his EKG in May. I believe that the patient has developed atrial fibrillation versus atrial flutter and has developed pulmonary edema and fluid retention secondary to this. Begin Lasix 40 mg by mouth daily for fluid retention. Patient is already on  Plavix given his history of stroke and ASCVD. Given the fact it appears that he is in atrial flutter particularly lead 2, I do not believe he would benefit from taking Coumadin in addition to the Plavix versus the benefit he would get if he were in atrial fibrillation. Furthermore his heart rate is controlled at the present time. Therefore, only start lasix andconsult his cardiologist and obtain an echocardiogram of the heart to evaluate for a depressed ejection fraction. Recheck in one week after gentle diuresis. Repeat EKG at that time as well.

## 2015-10-09 ENCOUNTER — Other Ambulatory Visit: Payer: Self-pay | Admitting: Family Medicine

## 2015-10-09 MED ORDER — METFORMIN HCL 500 MG PO TABS: ORAL_TABLET | ORAL | Status: DC

## 2015-10-09 MED ORDER — CARBAMAZEPINE ER 300 MG PO CP12: 300.0000 mg | ORAL_CAPSULE | Freq: Two times a day (BID) | ORAL | Status: DC

## 2015-10-13 ENCOUNTER — Encounter: Payer: Self-pay | Admitting: Family Medicine

## 2015-10-13 ENCOUNTER — Ambulatory Visit (INDEPENDENT_AMBULATORY_CARE_PROVIDER_SITE_OTHER): Payer: Medicare Other | Admitting: Family Medicine

## 2015-10-13 VITALS — BP 122/60 | HR 76 | Temp 97.9°F | Resp 12 | Ht 65.0 in | Wt 216.0 lb

## 2015-10-13 DIAGNOSIS — I484 Atypical atrial flutter: Secondary | ICD-10-CM | POA: Diagnosis not present

## 2015-10-13 DIAGNOSIS — R601 Generalized edema: Secondary | ICD-10-CM

## 2015-10-13 DIAGNOSIS — R0600 Dyspnea, unspecified: Secondary | ICD-10-CM | POA: Diagnosis not present

## 2015-10-13 DIAGNOSIS — I499 Cardiac arrhythmia, unspecified: Secondary | ICD-10-CM

## 2015-10-13 NOTE — Progress Notes (Signed)
Subjective:    Patient ID: [name], male    DOB: [birth year], 82 y.o.   MRN: [id number]  HPI  10/06/15 Please see my last office visit. Since that time, the patient has gained 3 pounds. He does have trace to +1 bipedal edema to the mid shin. He is concerned that he is retaining fluid. On examination today, he does have by basilar Rales. He denies any chest pain, shortness of breath, dyspnea on exertion, orthopnea. However on his examination today, his heart rate is irregularly irregular. It is difficult to tell if these are frequent PVCs versus true atrial fibrillation. Therefore I would like to obtain an EKG. Last echocardiogram of the heart was in 2014 and revealed an ejection fraction of 50-55%.  At that time, my plan was: EKG confirms an irregularly irregular rhythm. There are definite P waves. However pattern appear to be atrial flutter versus normal sinus rhythm with frequent PVCs. I believe this atrial flutter. Furthermore there are new ST changes in lead 1 that was not present compared to an EKG in May of this year. This is a significant change from his EKG in May. I believe that the patient has developed atrial fibrillation versus atrial flutter and has developed pulmonary edema and fluid retention secondary to this. Begin Lasix 40 mg by mouth daily for fluid retention. Patient is already on Plavix given his history of stroke and ASCVD. Given the fact it appears that he is in atrial flutter particularly lead 2, I do not believe he would benefit from taking Coumadin in addition to the Plavix versus the benefit he would get if he were in atrial fibrillation. Furthermore his heart rate is controlled at the present time. Therefore, only start lasix andconsult his cardiologist and obtain an echocardiogram of the heart to evaluate for a depressed ejection fraction. Recheck in one week after gentle diuresis. Repeat EKG at that time as well.  10/13/15 Patient is not feeling any better.  Became  nauseated yesterday. However he has lost 5 pounds since his last office visit. His lungs are clear. The edema in his legs is gone. However he has an irregularly irregular heart rate today. It sounds different from a flutter. I would like to repeat an EKG to verify the rhythm. He is taking Lasix and hydrochlorothiazide. I have recommended that he stop hydrochlorothiazide. Past Medical History:  Diagnosis Date  . BPH (benign prostatic hyperplasia)   . Coronary artery disease    a. s/p MI tx with Cypher DES to pRCA in 4/04;  b. Echocardiogram 7/09: Normal LV function.  c. Nuclear study 3/13 no ischemia;  d. ETT 2/14 neg;  e. admx with CP => LHC (01/30/2013):  pLAD 40-50%, oD1 70-80%, oOM1 40%, pRCA stent patent, mid RCA 30%, EF 60-65%. => med Rx.  . DDD (degenerative disc disease)   . Diabetes mellitus without complication ([place])   . Dyslipidemia   . Hemorrhoids   . Hx MRSA infection    left buttocks abscess  . Hx of echocardiogram    a. Echocardiogram (01/31/2013): Mild focal basal and mild concentric hypertrophy of the septum, EF 50-55%, normal wall motion, grade 1 diastolic dysfunction, trivial AI, MAC, mild LAE, PASP 35  . Hyperlipidemia   . Hypertension   . Hypothyroidism   . Meniere's disease    Status post shunt  . Myocardial infarction ([place]) 2008  . Occlusion and stenosis of carotid artery without mention of cerebral infarction    40-59% on carotid doppler  2014; [place] (01/2013): R 1-39%, L 60-79%  . Rotator cuff injury    chronic rotator cuff injury status post repair  . Seizure disorder ([place])   . Stroke [facility])    a. 01/2013=> post cardiac cath CVA to L post communicating artery system; R sided weakness  . Syncope    Past Surgical History:  Procedure Laterality Date  . COLONOSCOPY    . CORONARY ANGIOPLASTY WITH STENT PLACEMENT  07/14/2002   Stent to the right coronary artery  . ENDOLYMPHATIC SHUNT DECOMPRESSION  06/11/2009    Right endolymphatic sac decompression and shunt   placement  . HERNIA REPAIR  04/10/2008   scrotal hernia repair  . LEFT HEART CATHETERIZATION WITH CORONARY ANGIOGRAM N/A 01/30/2013   Procedure: LEFT HEART CATHETERIZATION WITH CORONARY ANGIOGRAM;  Surgeon: Larey Dresser, MD;  Location: [facility] CATH LAB;  Service: Cardiovascular;  Laterality: N/A;  . ROTATOR CUFF REPAIR     for chronic rotator cuff injury   Current Outpatient Prescriptions on File Prior to Visit  Medication Sig Dispense Refill  . albuterol (PROVENTIL HFA;VENTOLIN HFA) 108 (90 Base) MCG/ACT inhaler Inhale 2 puffs into the lungs every 4 (four) hours as needed for wheezing or shortness of breath. (Patient not taking: Reported on 09/02/2015) 1 Inhaler 0  . atorvastatin (LIPITOR) 80 MG tablet TAKE 1 TABLET BY MOUTH EVERY DAY 90 tablet 3  . carbamazepine (CARBATROL) 300 MG 12 hr capsule Take 1 capsule (300 mg total) by mouth 2 (two) times daily. 180 capsule 3  . cloNIDine (CATAPRES) 0.1 MG tablet TAKE 1 TABLET TWICE A DAY 60 tablet 6  . clopidogrel (PLAVIX) 75 MG tablet TAKE 1 TABLET BY MOUTH EVERY DAY 30 tablet 11  . diazepam (VALIUM) 2 MG tablet TAKE 1 TABLET TWICE A DAY AS NEEDED 60 tablet 2  . furosemide (LASIX) 40 MG tablet Take 1 tablet (40 mg total) by mouth daily. 30 tablet 0  . hydrochlorothiazide (HYDRODIURIL) 25 MG tablet Take 25 mg by mouth daily.     . isosorbide mononitrate (IMDUR) 30 MG 24 hr tablet TAKE 1 TABLET BY MOUTH EVERY DAY 90 tablet 1  . levothyroxine (SYNTHROID, LEVOTHROID) 25 MCG tablet TAKE 2 TABLETS BY MOUTH DAILY BEFORE BREAKFAST 30 tablet 11  . losartan (COZAAR) 100 MG tablet TAKE 1/2 TABLET BY MOUTH 2 TIMES A DAY 30 tablet 1  . metFORMIN (GLUCOPHAGE) 500 MG tablet TAKE 1 TABLET TWICE A DAY WITH A MEAL 180 tablet 3  . metoprolol (LOPRESSOR) 50 MG tablet TAKE 1 TABLET BY MOUTH TWICE A DAY 60 tablet 11  . Multiple Vitamins-Minerals (MULTIVITAMINS THER. W/MINERALS) TABS Take 1 tablet by mouth at bedtime.      Marland Kitchen NITROSTAT 0.4 MG SL tablet PLACE 1 TABLET UNDER  TONGUE EVERY 5 MINS AS NEEDED FOR CHEST PAIN 25 tablet 0  . pantoprazole (PROTONIX) 40 MG tablet TAKE 1 TABLET BY MOUTH EVERY DAY 90 tablet 4  . silodosin (RAPAFLO) 8 MG CAPS capsule Take 1 capsule (8 mg total) by mouth daily with breakfast. 30 capsule 5   No current facility-administered medications on file prior to visit.    No Known Allergies Social History   Social History  . Marital status: Married    Spouse name: N/A  . Number of children: 4  . Years of education: 12th   Occupational History  . Retired    Social History Main Topics  . Smoking status: Former Smoker    Quit date: 08/19/1961  . Smokeless tobacco: Former Systems developer  Types: Chew  . Alcohol use No  . Drug use: No  . Sexual activity: No   Other Topics Concern  . Not on file   Social History Narrative   Lives with wife.    Review of Systems  All other systems reviewed and are negative.      Objective:   Physical Exam  Constitutional: He appears well-developed and well-nourished.  Neck: No JVD present.  Cardiovascular: Normal rate.  An irregularly irregular rhythm present.  No murmur heard. Pulmonary/Chest: Effort normal and breath sounds normal. No respiratory distress. He has no wheezes. He has no rales.  Abdominal: Soft. Bowel sounds are normal.  Musculoskeletal: He exhibits edema.  Vitals reviewed.        Assessment & Plan:  Irregular heartbeat - Plan: EKG 12-Lead  Atypical atrial flutter ([facility])  Dyspnea  Generalized edema  EKG today confirms atrial flutter. Therefore we will continue Plavix. Patient is scheduled to see his cardiologist in August. Heart rate is well controlled. He is complaining of some nausea. I believe it may be due to the fact I have dehydrated him as he has lost 5 pounds in less than a week.  Cut Lasix to 20 mg a day and discontinue hydrochlorothiazide. Recheck in one week or sooner if worse

## 2015-10-16 ENCOUNTER — Other Ambulatory Visit: Payer: Self-pay | Admitting: *Deleted

## 2015-10-16 ENCOUNTER — Ambulatory Visit (INDEPENDENT_AMBULATORY_CARE_PROVIDER_SITE_OTHER): Payer: Medicare Other | Admitting: Family Medicine

## 2015-10-16 ENCOUNTER — Ambulatory Visit
Admission: RE | Admit: 2015-10-16 | Discharge: 2015-10-16 | Disposition: A | Discharge status: Home / Self Care | Payer: Medicare Other | Source: Ambulatory Visit | Attending: Family Medicine | Admitting: Family Medicine

## 2015-10-16 ENCOUNTER — Encounter: Payer: Self-pay | Admitting: Family Medicine

## 2015-10-16 VITALS — BP 100/58 | HR 78 | Temp 97.5°F | Resp 16 | Ht 65.0 in | Wt 216.0 lb

## 2015-10-16 DIAGNOSIS — R1115 Cyclical vomiting syndrome unrelated to migraine: Secondary | ICD-10-CM

## 2015-10-16 DIAGNOSIS — R0600 Dyspnea, unspecified: Secondary | ICD-10-CM

## 2015-10-16 DIAGNOSIS — I484 Atypical atrial flutter: Secondary | ICD-10-CM

## 2015-10-16 DIAGNOSIS — G43A0 Cyclical vomiting, not intractable: Secondary | ICD-10-CM | POA: Diagnosis not present

## 2015-10-16 DIAGNOSIS — R0602 Shortness of breath: Secondary | ICD-10-CM | POA: Diagnosis not present

## 2015-10-16 DIAGNOSIS — R197 Diarrhea, unspecified: Secondary | ICD-10-CM | POA: Diagnosis not present

## 2015-10-16 LAB — CBC WITH DIFFERENTIAL/PLATELET
Basophils Absolute: 0 cells/uL (ref 0–200)
Basophils Relative: 0 %
Eosinophils Absolute: 188 cells/uL (ref 15–500)
Eosinophils Relative: 2 %
HCT: 35.9 % — ABNORMAL LOW (ref 38.5–50.0)
Hemoglobin: 12.5 g/dL — ABNORMAL LOW (ref 13.0–17.0)
Lymphocytes Relative: 21 %
Lymphs Abs: 1974 cells/uL (ref 850–3900)
MCH: 30 pg (ref 27.0–33.0)
MCHC: 34.8 g/dL (ref 32.0–36.0)
MCV: 86.3 fL (ref 80.0–100.0)
MPV: 9.3 fL (ref 7.5–12.5)
Monocytes Absolute: 1316 cells/uL — ABNORMAL HIGH (ref 200–950)
Monocytes Relative: 14 %
Neutro Abs: 5922 cells/uL (ref 1500–7800)
Neutrophils Relative %: 63 %
Platelets: 333 10*3/uL (ref 140–400)
RBC: 4.16 MIL/uL — ABNORMAL LOW (ref 4.20–5.80)
RDW: 13.8 % (ref 11.0–15.0)
WBC: 9.4 10*3/uL (ref 3.8–10.8)

## 2015-10-16 LAB — TSH: TSH: 3.98 mIU/L (ref 0.40–4.50)

## 2015-10-16 MED ORDER — METOCLOPRAMIDE HCL 5 MG PO TABS: 5.0000 mg | ORAL_TABLET | Freq: Three times a day (TID) | ORAL | 1 refills | Status: DC | PRN

## 2015-10-16 NOTE — Addendum Note (Signed)
Addended by: Sheral Flow on: 10/16/2015 02:39 PM   Modules accepted: Orders

## 2015-10-16 NOTE — Progress Notes (Signed)
Subjective:    Patient ID: [name], male    DOB: [birth year], 82 y.o.   MRN: [id number]  HPI 10/06/15 Please see my last office visit. Since that time, the patient has gained 3 pounds. He does have trace to +1 bipedal edema to the mid shin. He is concerned that he is retaining fluid. On examination today, he does have by basilar Rales. He denies any chest pain, shortness of breath, dyspnea on exertion, orthopnea. However on his examination today, his heart rate is irregularly irregular. It is difficult to tell if these are frequent PVCs versus true atrial fibrillation. Therefore I would like to obtain an EKG. Last echocardiogram of the heart was in 2014 and revealed an ejection fraction of 50-55%.  At that time, my plan was: EKG confirms an irregularly irregular rhythm. There are definite P waves. However pattern appear to be atrial flutter versus normal sinus rhythm with frequent PVCs. I believe this atrial flutter. Furthermore there are new ST changes in lead 1 that was not present compared to an EKG in May of this year. This is a significant change from his EKG in May. I believe that the patient has developed atrial fibrillation versus atrial flutter and has developed pulmonary edema and fluid retention secondary to this. Begin Lasix 40 mg by mouth daily for fluid retention. Patient is already on Plavix given his history of stroke and ASCVD. Given the fact it appears that he is in atrial flutter particularly lead 2, I do not believe he would benefit from taking Coumadin in addition to the Plavix versus the benefit he would get if he were in atrial fibrillation. Furthermore his heart rate is controlled at the present time. Therefore, only start lasix andconsult his cardiologist and obtain an echocardiogram of the heart to evaluate for a depressed ejection fraction. Recheck in one week after gentle diuresis. Repeat EKG at that time as well.  10/13/15 Patient is not feeling any better.  Became  nauseated yesterday. However he has lost 5 pounds since his last office visit. His lungs are clear. The edema in his legs is gone. However he has an irregularly irregular heart rate today. It sounds different from a flutter. I would like to repeat an EKG to verify the rhythm. He is taking Lasix and hydrochlorothiazide. I have recommended that he stop hydrochlorothiazide.  At that time, my plan was: EKG today confirms atrial flutter. Therefore we will continue Plavix. Patient is scheduled to see his cardiologist in August. Heart rate is well controlled. He is complaining of some nausea. I believe it may be due to the fact I have dehydrated him as he has lost 5 pounds in less than a week.  Cut Lasix to 20 mg a day and discontinue hydrochlorothiazide. Recheck in one week or sooner if worse  10/16/15 Patient states that he is feeling worse. He is now having intractable nausea. The nausea is constant but within 1 hour of eating he is vomiting. He has vomited 3 or 4 times in the last few days. He states that he has not vomited in 20 years prior to this. He denies any constipation. In fact he is having diarrhea which is chronic over the last 3 years. He denies any abdominal pain. He denies any fevers or chills. He denies any blood in his stools or black tarry stools. On examination today the abdomen is soft nontender nondistended but very hypoactive bowel sounds. He also has some faint right basilar crackles. He continues  to remain an irregularly irregular rhythm. There is no pedal edema or evidence of fluid overload. Past Medical History:  Diagnosis Date  . Atrial flutter ([place])   . BPH (benign prostatic hyperplasia)   . Coronary artery disease    a. s/p MI tx with Cypher DES to pRCA in 4/04;  b. Echocardiogram 7/09: Normal LV function.  c. Nuclear study 3/13 no ischemia;  d. ETT 2/14 neg;  e. admx with CP => [facility] (01/30/2013):  pLAD 40-50%, oD1 70-80%, oOM1 40%, pRCA stent patent, mid RCA 30%, EF 60-65%. => med  Rx.  . DDD (degenerative disc disease)   . Diabetes mellitus without complication ([place])   . Dyslipidemia   . Hemorrhoids   . Hx MRSA infection    left buttocks abscess  . Hx of echocardiogram    a. Echocardiogram (01/31/2013): Mild focal basal and mild concentric hypertrophy of the septum, EF 50-55%, normal wall motion, grade 1 diastolic dysfunction, trivial AI, MAC, mild LAE, PASP 35  . Hyperlipidemia   . Hypertension   . Hypothyroidism   . Meniere's disease    Status post shunt  . Myocardial infarction ([place]) 2008  . Occlusion and stenosis of carotid artery without mention of cerebral infarction    40-59% on carotid doppler 2014; [place] (01/2013): R 1-39%, L 60-79%  . Rotator cuff injury    chronic rotator cuff injury status post repair  . Seizure disorder ([place])   . Stroke [facility])    a. 01/2013=> post cardiac cath CVA to L post communicating artery system; R sided weakness  . Syncope    Past Surgical History:  Procedure Laterality Date  . COLONOSCOPY    . CORONARY ANGIOPLASTY WITH STENT PLACEMENT  07/14/2002   Stent to the right coronary artery  . ENDOLYMPHATIC SHUNT DECOMPRESSION  06/11/2009    Right endolymphatic sac decompression and shunt  placement  . HERNIA REPAIR  04/10/2008   scrotal hernia repair  . LEFT HEART CATHETERIZATION WITH CORONARY ANGIOGRAM N/A 01/30/2013   Procedure: LEFT HEART CATHETERIZATION WITH CORONARY ANGIOGRAM;  Surgeon: Larey Dresser, MD;  Location[facility] CATH LAB;  Service: Cardiovascular;  Laterality: N/A;  . ROTATOR CUFF REPAIR     for chronic rotator cuff injury   Current Outpatient Prescriptions on File Prior to Visit  Medication Sig Dispense Refill  . atorvastatin (LIPITOR) 80 MG tablet TAKE 1 TABLET BY MOUTH EVERY DAY 90 tablet 3  . carbamazepine (CARBATROL) 300 MG 12 hr capsule Take 1 capsule (300 mg total) by mouth 2 (two) times daily. 180 capsule 3  . cloNIDine (CATAPRES) 0.1 MG tablet TAKE 1 TABLET TWICE A DAY 60 tablet 6  . clopidogrel  (PLAVIX) 75 MG tablet TAKE 1 TABLET BY MOUTH EVERY DAY 30 tablet 11  . diazepam (VALIUM) 2 MG tablet TAKE 1 TABLET TWICE A DAY AS NEEDED 60 tablet 2  . furosemide (LASIX) 40 MG tablet Take 1 tablet (40 mg total) by mouth daily. (Patient taking differently: Take 20 mg by mouth daily. ) 30 tablet 0  . isosorbide mononitrate (IMDUR) 30 MG 24 hr tablet TAKE 1 TABLET BY MOUTH EVERY DAY 90 tablet 1  . levothyroxine (SYNTHROID, LEVOTHROID) 25 MCG tablet TAKE 2 TABLETS BY MOUTH DAILY BEFORE BREAKFAST 30 tablet 11  . losartan (COZAAR) 100 MG tablet TAKE 1/2 TABLET BY MOUTH 2 TIMES A DAY 30 tablet 1  . metFORMIN (GLUCOPHAGE) 500 MG tablet TAKE 1 TABLET TWICE A DAY WITH A MEAL 180 tablet 3  . metoprolol (  LOPRESSOR) 50 MG tablet TAKE 1 TABLET BY MOUTH TWICE A DAY 60 tablet 11  . Multiple Vitamins-Minerals (MULTIVITAMINS THER. W/MINERALS) TABS Take 1 tablet by mouth at bedtime.      Marland Kitchen NITROSTAT 0.4 MG SL tablet PLACE 1 TABLET UNDER TONGUE EVERY 5 MINS AS NEEDED FOR CHEST PAIN 25 tablet 0  . pantoprazole (PROTONIX) 40 MG tablet TAKE 1 TABLET BY MOUTH EVERY DAY 90 tablet 4  . silodosin (RAPAFLO) 8 MG CAPS capsule Take 1 capsule (8 mg total) by mouth daily with breakfast. 30 capsule 5   No current facility-administered medications on file prior to visit.    No Known Allergies Social History   Social History  . Marital status: Married    Spouse name: N/A  . Number of children: 4  . Years of education: 12th   Occupational History  . Retired    Social History Main Topics  . Smoking status: Former Smoker    Quit date: 08/19/1961  . Smokeless tobacco: Former Systems developer    Types: Chew  . Alcohol use No  . Drug use: No  . Sexual activity: No   Other Topics Concern  . Not on file   Social History Narrative   Lives with wife.    Review of Systems  All other systems reviewed and are negative.      Objective:   Physical Exam  Constitutional: He appears well-developed and well-nourished.  Neck: No  JVD present.  Cardiovascular: Normal rate.  An irregularly irregular rhythm present.  No murmur heard. Pulmonary/Chest: Effort normal and breath sounds normal. No respiratory distress. He has no wheezes. He has no rales.  Abdominal: Soft. He exhibits no distension and no mass. Bowel sounds are decreased. There is no tenderness. There is no rebound and no guarding.  Musculoskeletal: He exhibits no edema.  Vitals reviewed.        Assessment & Plan:  Non-intractable cyclical vomiting with nausea - Plan: CBC with Differential/Platelet, COMPLETE METABOLIC PANEL WITH GFR, TSH, Lipase, DG Abd Acute W/Chest  Atypical atrial flutter (HCC)  Dyspnea Patient fell recently. He denies any dizziness or syncope. His feet became tangled up by walking and he fell. However he also is having constant nausea which seems to be getting worse. I will check a CBC, CMP, lipase, and a TSH.  I will also check a chest x-ray as well as a two-view of the abdomen. Differential diagnosis includes ileus, diabetic gastroparesis, anxiety, or intra-abdominal process. His CBC is normal, lipase is normal, liver function tests are normal, and the abdomen x-ray reveals no ileus or obstruction, I will treat the patient empirically with Reglan 10 mg 3 times a day and recheck next week. 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

## 2015-10-17 ENCOUNTER — Other Ambulatory Visit: Payer: Self-pay | Admitting: Family Medicine

## 2015-10-17 ENCOUNTER — Ambulatory Visit (HOSPITAL_COMMUNITY)
Admission: RE | Admit: 2015-10-17 | Discharge: 2015-10-17 | Disposition: A | Discharge status: Home / Self Care | Payer: Medicare Other | Source: Ambulatory Visit | Attending: Family Medicine | Admitting: Family Medicine

## 2015-10-17 DIAGNOSIS — R197 Diarrhea, unspecified: Secondary | ICD-10-CM | POA: Insufficient documentation

## 2015-10-17 DIAGNOSIS — N400 Enlarged prostate without lower urinary tract symptoms: Secondary | ICD-10-CM | POA: Insufficient documentation

## 2015-10-17 DIAGNOSIS — K4090 Unilateral inguinal hernia, without obstruction or gangrene, not specified as recurrent: Secondary | ICD-10-CM | POA: Insufficient documentation

## 2015-10-17 DIAGNOSIS — R112 Nausea with vomiting, unspecified: Secondary | ICD-10-CM

## 2015-10-17 DIAGNOSIS — G43A0 Cyclical vomiting, not intractable: Secondary | ICD-10-CM

## 2015-10-17 DIAGNOSIS — I700 Atherosclerosis of aorta: Secondary | ICD-10-CM | POA: Insufficient documentation

## 2015-10-17 DIAGNOSIS — R1084 Generalized abdominal pain: Secondary | ICD-10-CM | POA: Insufficient documentation

## 2015-10-17 DIAGNOSIS — R748 Abnormal levels of other serum enzymes: Secondary | ICD-10-CM

## 2015-10-17 DIAGNOSIS — K859 Acute pancreatitis, unspecified: Secondary | ICD-10-CM

## 2015-10-17 LAB — COMPLETE METABOLIC PANEL WITH GFR
ALT: 23 U/L (ref 9–46)
AST: 22 U/L (ref 10–35)
Albumin: 4 g/dL (ref 3.6–5.1)
Alkaline Phosphatase: 83 U/L (ref 40–115)
BUN: 18 mg/dL (ref 7–25)
CO2: 28 mmol/L (ref 20–31)
Calcium: 9.1 mg/dL (ref 8.6–10.3)
Chloride: 88 mmol/L — ABNORMAL LOW (ref 98–110)
Creat: 0.99 mg/dL (ref 0.70–1.11)
GFR, Est African American: 83 mL/min (ref >=60)
GFR, Est Non African American: 72 mL/min (ref >=60)
Glucose, Bld: 126 mg/dL — ABNORMAL HIGH (ref 70–99)
Potassium: 4.3 mmol/L (ref 3.5–5.3)
Sodium: 126 mmol/L — ABNORMAL LOW (ref 135–146)
Total Bilirubin: 0.4 mg/dL (ref 0.2–1.2)
Total Protein: 6.2 g/dL (ref 6.1–8.1)

## 2015-10-17 LAB — LIPASE: Lipase: 377 U/L — ABNORMAL HIGH (ref 7–60)

## 2015-10-17 MED ORDER — IOPAMIDOL (ISOVUE-300) INJECTION 61%
100.0000 mL | Freq: Once | INTRAVENOUS | Status: AC | PRN
  Administered 2015-10-17: 100 mL via INTRAVENOUS

## 2015-10-20 ENCOUNTER — Ambulatory Visit: Payer: Medicare Other | Admitting: Family Medicine

## 2015-10-20 ENCOUNTER — Ambulatory Visit (INDEPENDENT_AMBULATORY_CARE_PROVIDER_SITE_OTHER): Payer: Medicare Other | Admitting: Family Medicine

## 2015-10-20 ENCOUNTER — Encounter: Payer: Self-pay | Admitting: Family Medicine

## 2015-10-20 ENCOUNTER — Other Ambulatory Visit: Payer: Self-pay | Admitting: Family Medicine

## 2015-10-20 ENCOUNTER — Ambulatory Visit (HOSPITAL_COMMUNITY): Payer: Medicare Other

## 2015-10-20 VITALS — BP 120/60 | HR 60 | Temp 98.2°F | Resp 16 | Ht 65.0 in | Wt 221.0 lb

## 2015-10-20 DIAGNOSIS — R1115 Cyclical vomiting syndrome unrelated to migraine: Secondary | ICD-10-CM

## 2015-10-20 DIAGNOSIS — G43A0 Cyclical vomiting, not intractable: Secondary | ICD-10-CM

## 2015-10-20 DIAGNOSIS — I484 Atypical atrial flutter: Secondary | ICD-10-CM

## 2015-10-20 DIAGNOSIS — R748 Abnormal levels of other serum enzymes: Secondary | ICD-10-CM

## 2015-10-20 DIAGNOSIS — E871 Hypo-osmolality and hyponatremia: Secondary | ICD-10-CM

## 2015-10-20 DIAGNOSIS — R531 Weakness: Secondary | ICD-10-CM

## 2015-10-20 LAB — CBC WITH DIFFERENTIAL/PLATELET
Basophils Absolute: 0 cells/uL (ref 0–200)
Basophils Relative: 0 %
Eosinophils Absolute: 552 cells/uL — ABNORMAL HIGH (ref 15–500)
Eosinophils Relative: 8 %
HCT: 33.8 % — ABNORMAL LOW (ref 38.5–50.0)
Hemoglobin: 11.2 g/dL — ABNORMAL LOW (ref 13.0–17.0)
Lymphocytes Relative: 22 %
Lymphs Abs: 1518 cells/uL (ref 850–3900)
MCH: 29.3 pg (ref 27.0–33.0)
MCHC: 33.1 g/dL (ref 32.0–36.0)
MCV: 88.5 fL (ref 80.0–100.0)
MPV: 9.3 fL (ref 7.5–12.5)
Monocytes Absolute: 690 cells/uL (ref 200–950)
Monocytes Relative: 10 %
Neutro Abs: 4140 cells/uL (ref 1500–7800)
Neutrophils Relative %: 60 %
Platelets: 292 10*3/uL (ref 140–400)
RBC: 3.82 MIL/uL — ABNORMAL LOW (ref 4.20–5.80)
RDW: 14 % (ref 11.0–15.0)
WBC: 6.9 10*3/uL (ref 3.8–10.8)

## 2015-10-20 LAB — COMPLETE METABOLIC PANEL WITH GFR
ALT: 15 U/L (ref 9–46)
AST: 24 U/L (ref 10–35)
Albumin: 3.7 g/dL (ref 3.6–5.1)
Alkaline Phosphatase: 67 U/L (ref 40–115)
BUN: 17 mg/dL (ref 7–25)
CO2: 27 mmol/L (ref 20–31)
Calcium: 8.8 mg/dL (ref 8.6–10.3)
Chloride: 94 mmol/L — ABNORMAL LOW (ref 98–110)
Creat: 0.81 mg/dL (ref 0.70–1.11)
GFR, Est African American: >89 mL/min (ref >=60)
GFR, Est Non African American: 84 mL/min (ref >=60)
Glucose, Bld: 144 mg/dL — ABNORMAL HIGH (ref 70–99)
Potassium: 4.7 mmol/L (ref 3.5–5.3)
Sodium: 128 mmol/L — ABNORMAL LOW (ref 135–146)
Total Bilirubin: 0.3 mg/dL (ref 0.2–1.2)
Total Protein: 5.5 g/dL — ABNORMAL LOW (ref 6.1–8.1)

## 2015-10-20 LAB — LIPASE: Lipase: 150 U/L — ABNORMAL HIGH (ref 7–60)

## 2015-10-20 MED ORDER — METOCLOPRAMIDE HCL 10 MG PO TABS: 10.0000 mg | ORAL_TABLET | Freq: Three times a day (TID) | ORAL | 1 refills | Status: DC

## 2015-10-20 NOTE — Progress Notes (Signed)
Subjective:    Patient ID: [name], male    DOB: 11-16-1933, 82 y.o.   MRN: [id number]  HPI 10/06/15 Please see my last office visit. Since that time, the patient has gained 3 pounds. He does have trace to +1 bipedal edema to the mid shin. He is concerned that he is retaining fluid. On examination today, he does have by basilar Rales. He denies any chest pain, shortness of breath, dyspnea on exertion, orthopnea. However on his examination today, his heart rate is irregularly irregular. It is difficult to tell if these are frequent PVCs versus true atrial fibrillation. Therefore I would like to obtain an EKG. Last echocardiogram of the heart was in 2014 and revealed an ejection fraction of 50-55%.  At that time, my plan was: EKG confirms an irregularly irregular rhythm. There are definite P waves. However pattern appear to be atrial flutter versus normal sinus rhythm with frequent PVCs. I believe this atrial flutter. Furthermore there are new ST changes in lead 1 that was not present compared to an EKG in May of this year. This is a significant change from his EKG in May. I believe that the patient has developed atrial fibrillation versus atrial flutter and has developed pulmonary edema and fluid retention secondary to this. Begin Lasix 40 mg by mouth daily for fluid retention. Patient is already on Plavix given his history of stroke and ASCVD. Given the fact it appears that he is in atrial flutter particularly lead 2, I do not believe he would benefit from taking Coumadin in addition to the Plavix versus the benefit he would get if he were in atrial fibrillation. Furthermore his heart rate is controlled at the present time. Therefore, only start lasix andconsult his cardiologist and obtain an echocardiogram of the heart to evaluate for a depressed ejection fraction. Recheck in one week after gentle diuresis. Repeat EKG at that time as well.  10/13/15 Patient is not feeling any better.  Became  nauseated yesterday. However he has lost 5 pounds since his last office visit. His lungs are clear. The edema in his legs is gone. However he has an irregularly irregular heart rate today. It sounds different from a flutter. I would like to repeat an EKG to verify the rhythm. He is taking Lasix and hydrochlorothiazide. I have recommended that he stop hydrochlorothiazide.  At that time, my plan was: EKG today confirms atrial flutter. Therefore we will continue Plavix. Patient is scheduled to see his cardiologist in August. Heart rate is well controlled. He is complaining of some nausea. I believe it may be due to the fact I have dehydrated him as he has lost 5 pounds in less than a week.  Cut Lasix to 20 mg a day and discontinue hydrochlorothiazide. Recheck in one week or sooner if worse  10/16/15 Patient states that he is feeling worse. He is now having intractable nausea. The nausea is constant but within 1 hour of eating he is vomiting. He has vomited 3 or 4 times in the last few days. He states that he has not vomited in 20 years prior to this. He denies any constipation. In fact he is having diarrhea which is chronic over the last 3 years. He denies any abdominal pain. He denies any fevers or chills. He denies any blood in his stools or black tarry stools. On examination today the abdomen is soft nontender nondistended but very hypoactive bowel sounds. He also has some faint right basilar crackles. He continues  to remain an irregularly irregular rhythm. There is no pedal edema or evidence of fluid overload.  At that time, my plan was: Patient fell recently. He denies any dizziness or syncope. His feet became tangled up by walking and he fell. However he also is having constant nausea which seems to be getting worse. I will check a CBC, CMP, lipase, and a TSH.  I will also check a chest x-ray as well as a two-view of the abdomen. Differential diagnosis includes ileus, diabetic gastroparesis, anxiety, or  intra-abdominal process. His CBC is normal, lipase is normal, liver function tests are normal, and the abdomen x-ray reveals no ileus or obstruction, I will treat the patient empirically with Reglan 10 mg 3 times a day and recheck next week. 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 It also revealed an elevated lipase greater than 300. Over the weekend I recommended fluid restriction to less than 1200 mL per day. I recommended a high sodium containing diet. I recommended trying Reglan for possible gastroparesis. Regarding his lipase, I obtained a CT scan of the abdomen and pelvis which revealed no evidence of pancreatic cancer or pancreatitis. I did recommend a bland diet with plan to follow-up today and recheck the patient's status. Past Medical History:  Diagnosis Date  . Atrial flutter ([place])   . BPH (benign prostatic hyperplasia)   . Coronary artery disease    a. s/p MI tx with Cypher DES to pRCA in 4/04;  b. Echocardiogram 7/09: Normal LV function.  c. Nuclear study 3/13 no ischemia;  d. ETT 2/14 neg;  e. admx with CP => LHC (01/30/2013):  pLAD 40-50%, oD1 70-80%, oOM1 40%, pRCA stent patent, mid RCA 30%, EF 60-65%. => med Rx.  . DDD (degenerative disc disease)   . Diabetes mellitus without complication ([place])   . Dyslipidemia   . Hemorrhoids   . Hx MRSA infection    left buttocks abscess  . Hx of echocardiogram    a. Echocardiogram (01/31/2013): Mild focal basal and mild concentric hypertrophy of the septum, EF 50-55%, normal wall motion, grade 1 diastolic dysfunction, trivial AI, MAC, mild LAE, PASP 35  . Hyperlipidemia   . Hypertension   . Hypothyroidism   . Meniere's disease    Status post shunt  . Myocardial infarction ([place]) 2008  . Occlusion and stenosis of carotid artery without mention of cerebral infarction    40-59% on carotid  doppler 2014; [place] (01/2013): R 1-39%, L 60-79%  . Rotator cuff injury    chronic rotator cuff injury status post repair  . Seizure disorder ([place])   . Stroke [facility])    a. 01/2013=> post cardiac cath CVA to L post communicating artery system; R sided weakness  . Syncope    Past Surgical History:  Procedure Laterality Date  . COLONOSCOPY    . CORONARY ANGIOPLASTY WITH STENT PLACEMENT  07/14/2002   Stent to the right coronary artery  . ENDOLYMPHATIC SHUNT DECOMPRESSION  06/11/2009    Right endolymphatic sac decompression and shunt  placement  . HERNIA REPAIR  04/10/2008   scrotal hernia repair  . LEFT HEART CATHETERIZATION WITH CORONARY ANGIOGRAM N/A 01/30/2013   Procedure: LEFT HEART CATHETERIZATION WITH CORONARY ANGIOGRAM;  Surgeon: Larey Dresser, MD;  Location: [facility] CATH LAB;  Service: Cardiovascular;  Laterality: N/A;  . ROTATOR CUFF REPAIR  for chronic rotator cuff injury   Current Outpatient Prescriptions on File Prior to Visit  Medication Sig Dispense Refill  . atorvastatin (LIPITOR) 80 MG tablet TAKE 1 TABLET BY MOUTH EVERY DAY 90 tablet 3  . carbamazepine (CARBATROL) 300 MG 12 hr capsule Take 1 capsule (300 mg total) by mouth 2 (two) times daily. 180 capsule 3  . cloNIDine (CATAPRES) 0.1 MG tablet TAKE 1 TABLET TWICE A DAY 60 tablet 6  . clopidogrel (PLAVIX) 75 MG tablet TAKE 1 TABLET BY MOUTH EVERY DAY 30 tablet 11  . diazepam (VALIUM) 2 MG tablet TAKE 1 TABLET TWICE A DAY AS NEEDED 60 tablet 2  . furosemide (LASIX) 40 MG tablet Take 1 tablet (40 mg total) by mouth daily. (Patient taking differently: Take 20 mg by mouth daily. ) 30 tablet 0  . isosorbide mononitrate (IMDUR) 30 MG 24 hr tablet TAKE 1 TABLET BY MOUTH EVERY DAY 90 tablet 1  . levothyroxine (SYNTHROID, LEVOTHROID) 25 MCG tablet TAKE 2 TABLETS BY MOUTH DAILY BEFORE BREAKFAST 30 tablet 11  . losartan (COZAAR) 100 MG tablet TAKE 1/2 TABLET BY MOUTH 2 TIMES A DAY 30 tablet 1  . metFORMIN (GLUCOPHAGE) 500 MG tablet  TAKE 1 TABLET TWICE A DAY WITH A MEAL 180 tablet 3  . metoCLOPramide (REGLAN) 5 MG tablet Take 1 tablet (5 mg total) by mouth every 8 (eight) hours as needed for nausea. 30 tablet 1  . metoprolol (LOPRESSOR) 50 MG tablet TAKE 1 TABLET BY MOUTH TWICE A DAY 60 tablet 11  . Multiple Vitamins-Minerals (MULTIVITAMINS THER. W/MINERALS) TABS Take 1 tablet by mouth at bedtime.      Marland Kitchen NITROSTAT 0.4 MG SL tablet PLACE 1 TABLET UNDER TONGUE EVERY 5 MINS AS NEEDED FOR CHEST PAIN 25 tablet 0  . pantoprazole (PROTONIX) 40 MG tablet TAKE 1 TABLET BY MOUTH EVERY DAY 90 tablet 4  . silodosin (RAPAFLO) 8 MG CAPS capsule Take 1 capsule (8 mg total) by mouth daily with breakfast. 30 capsule 5   No current facility-administered medications on file prior to visit.    No Known Allergies Social History   Social History  . Marital status: Married    Spouse name: N/A  . Number of children: 4  . Years of education: 12th   Occupational History  . Retired    Social History Main Topics  . Smoking status: Former Smoker    Quit date: 08/19/1961  . Smokeless tobacco: Former Systems developer    Types: Chew  . Alcohol use No  . Drug use: No  . Sexual activity: No   Other Topics Concern  . Not on file   Social History Narrative   Lives with wife.    Review of Systems  All other systems reviewed and are negative.      Objective:   Physical Exam  Constitutional: He appears well-developed and well-nourished.  Neck: No JVD present.  Cardiovascular: Normal rate.  An irregularly irregular rhythm present.  No murmur heard. Pulmonary/Chest: Effort normal and breath sounds normal. No respiratory distress. He has no wheezes. He has no rales.  Abdominal: Soft. He exhibits no distension and no mass. Bowel sounds are decreased. There is no tenderness. There is no rebound and no guarding.  Musculoskeletal: He exhibits no edema.  Vitals reviewed.        Assessment & Plan:     Subjective:    Patient ID: [name], male    DOB: Jun 22, 1933, 82 y.o.   MRN: [id number]  HPI 10/06/15 Please see my last office visit. Since that time, the patient has gained 3 pounds. He does have trace to +1 bipedal edema to the mid shin. He is concerned that he is retaining fluid. On examination today, he does have by basilar Rales. He denies any chest pain, shortness of breath, dyspnea on exertion, orthopnea. However on his examination today, his heart rate is irregularly irregular. It is difficult to tell if these are frequent PVCs versus true atrial fibrillation. Therefore I would like to obtain an EKG. Last echocardiogram of the heart was in 2014 and revealed an ejection fraction of 50-55%.  At that time, my plan was: EKG confirms an irregularly irregular rhythm. There are definite P waves. However pattern appear to be atrial flutter versus normal sinus rhythm with frequent PVCs. I believe this atrial flutter. Furthermore there are new ST changes in lead 1 that was not present compared to an EKG in May of this year. This is a significant change from his EKG in May. I believe that the patient has developed atrial fibrillation versus atrial flutter and has developed pulmonary edema and fluid retention secondary to this. Begin Lasix 40 mg by mouth daily for fluid retention. Patient is already on Plavix given his history of stroke and ASCVD. Given the fact it appears that he is in atrial flutter particularly lead 2, I do not believe he would benefit from taking Coumadin in addition to the Plavix versus the benefit he would get if he were in atrial fibrillation. Furthermore his heart rate is controlled at the present time. Therefore, only start lasix andconsult his cardiologist and obtain an echocardiogram of the heart to evaluate for a depressed ejection fraction. Recheck in one week after gentle diuresis. Repeat EKG at that time as well.  10/13/15 Patient is not feeling any better.  Became nauseated yesterday. However he has lost 5  pounds since his last office visit. His lungs are clear. The edema in his legs is gone. However he has an irregularly irregular heart rate today. It sounds different from a flutter. I would like to repeat an EKG to verify the rhythm. He is taking Lasix and hydrochlorothiazide. I have recommended that he stop hydrochlorothiazide.  At that time, my plan was: EKG today confirms atrial flutter. Therefore we will continue Plavix. Patient is scheduled to see his cardiologist in August. Heart rate is well controlled. He is complaining of some nausea. I believe it may be due to the fact I have dehydrated him as he has lost 5 pounds in less than a week.  Cut Lasix to 20 mg a day and discontinue hydrochlorothiazide. Recheck in one week or sooner if worse  10/16/15 Patient states that he is feeling worse. He is now having intractable nausea. The nausea is constant but within 1 hour of eating he is vomiting. He has vomited 3 or 4 times in the last few days. He states that he has not vomited in 20 years prior to this. He denies any constipation. In fact he is having diarrhea which is chronic over the last 3 years. He denies any abdominal pain. He denies any fevers or chills. He denies any blood in his stools or black tarry stools. On examination today the abdomen is soft nontender nondistended but very hypoactive bowel sounds. He also has some faint right basilar crackles. He continues to remain an irregularly irregular rhythm. There is no pedal edema or evidence of fluid overload.  At that time, my plan was:  Patient fell recently. He denies any dizziness or syncope. His feet became tangled up by walking and he fell. However he also is having constant nausea which seems to be getting worse. I will check a CBC, CMP, lipase, and a TSH.  I will also check a chest x-ray as well as a two-view of the abdomen. Differential diagnosis includes ileus, diabetic gastroparesis, anxiety, or intra-abdominal process. His CBC is normal,  lipase is normal, liver function tests are normal, and the abdomen x-ray reveals no ileus or obstruction, I will treat the patient empirically with Reglan 10 mg 3 times a day and recheck next week. 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 He states that he feels better. His weight is up to 221 pounds. He is eating better with less nausea. He reports just a little bit of nausea but no or near as severe as last week. His lab work was significant for hyponatremia and we have recommended fluid restriction to less than 1200 mL per day to manage this. His lab work was also significant for an elevated lipase of greater than 300. Therefore I obtained a CT scan of the abdomen and pelvis which revealed no evidence of pancreatitis or pancreatic cancer. Patient is still taking Reglan 3 times a day seems to help substantially with the nausea along with fluid restriction Past Medical History:  Diagnosis Date  . Atrial flutter ([place])   . BPH (benign prostatic hyperplasia)   . Coronary artery disease    a. s/p MI tx with Cypher DES to pRCA in 4/04;  b. Echocardiogram 7/09: Normal LV function.  c. Nuclear study 3/13 no ischemia;  d. ETT 2/14 neg;  e. admx with CP => LHC (01/30/2013):  pLAD 40-50%, oD1 70-80%, oOM1 40%, pRCA stent patent, mid RCA 30%, EF 60-65%. => med Rx.  . DDD (degenerative disc disease)   . Diabetes mellitus without complication ([place])   . Dyslipidemia   . Hemorrhoids   . Hx MRSA infection    left buttocks abscess  . Hx of echocardiogram    a. Echocardiogram (01/31/2013): Mild focal basal and mild concentric hypertrophy of the septum, EF 50-55%, normal wall motion, grade 1 diastolic dysfunction, trivial AI, MAC, mild LAE, PASP 35  . Hyperlipidemia   . Hypertension   . Hypothyroidism   . Meniere's disease    Status post shunt  . Myocardial infarction ([place])  2008  . Occlusion and stenosis of carotid artery without mention of cerebral infarction    40-59% on carotid doppler 2014; [place] (01/2013): R 1-39%, L 60-79%  . Rotator cuff injury    chronic rotator cuff injury status post repair  . Seizure disorder ([place])   . Stroke [facility])    a. 01/2013=> post cardiac cath CVA to L post communicating artery system; R sided weakness  . Syncope    Past Surgical History:  Procedure Laterality Date  . COLONOSCOPY    . CORONARY ANGIOPLASTY WITH STENT PLACEMENT  07/14/2002   Stent to the right coronary artery  . ENDOLYMPHATIC SHUNT DECOMPRESSION  06/11/2009    Right endolymphatic sac decompression and shunt  placement  . HERNIA REPAIR  04/10/2008   scrotal hernia repair  . LEFT HEART CATHETERIZATION WITH CORONARY ANGIOGRAM N/A 01/30/2013   Procedure: LEFT HEART CATHETERIZATION WITH CORONARY ANGIOGRAM;  Surgeon: Larey Dresser, MD;  Location: [facility]  CATH LAB;  Service: Cardiovascular;  Laterality: N/A;  . ROTATOR CUFF REPAIR     for chronic rotator cuff injury   Current Outpatient Prescriptions on File Prior to Visit  Medication Sig Dispense Refill  . atorvastatin (LIPITOR) 80 MG tablet TAKE 1 TABLET BY MOUTH EVERY DAY 90 tablet 3  . carbamazepine (CARBATROL) 300 MG 12 hr capsule Take 1 capsule (300 mg total) by mouth 2 (two) times daily. 180 capsule 3  . cloNIDine (CATAPRES) 0.1 MG tablet TAKE 1 TABLET TWICE A DAY 60 tablet 6  . clopidogrel (PLAVIX) 75 MG tablet TAKE 1 TABLET BY MOUTH EVERY DAY 30 tablet 11  . diazepam (VALIUM) 2 MG tablet TAKE 1 TABLET TWICE A DAY AS NEEDED 60 tablet 2  . furosemide (LASIX) 40 MG tablet Take 1 tablet (40 mg total) by mouth daily. (Patient taking differently: Take 20 mg by mouth daily. ) 30 tablet 0  . isosorbide mononitrate (IMDUR) 30 MG 24 hr tablet TAKE 1 TABLET BY MOUTH EVERY DAY 90 tablet 1  . levothyroxine (SYNTHROID, LEVOTHROID) 25 MCG tablet TAKE 2 TABLETS BY MOUTH DAILY BEFORE BREAKFAST 30 tablet 11  . losartan  (COZAAR) 100 MG tablet TAKE 1/2 TABLET BY MOUTH 2 TIMES A DAY 30 tablet 1  . metFORMIN (GLUCOPHAGE) 500 MG tablet TAKE 1 TABLET TWICE A DAY WITH A MEAL 180 tablet 3  . metoCLOPramide (REGLAN) 5 MG tablet Take 1 tablet (5 mg total) by mouth every 8 (eight) hours as needed for nausea. 30 tablet 1  . metoprolol (LOPRESSOR) 50 MG tablet TAKE 1 TABLET BY MOUTH TWICE A DAY 60 tablet 11  . Multiple Vitamins-Minerals (MULTIVITAMINS THER. W/MINERALS) TABS Take 1 tablet by mouth at bedtime.      Marland Kitchen NITROSTAT 0.4 MG SL tablet PLACE 1 TABLET UNDER TONGUE EVERY 5 MINS AS NEEDED FOR CHEST PAIN 25 tablet 0  . pantoprazole (PROTONIX) 40 MG tablet TAKE 1 TABLET BY MOUTH EVERY DAY 90 tablet 4  . silodosin (RAPAFLO) 8 MG CAPS capsule Take 1 capsule (8 mg total) by mouth daily with breakfast. 30 capsule 5   No current facility-administered medications on file prior to visit.    No Known Allergies Social History   Social History  . Marital status: Married    Spouse name: N/A  . Number of children: 4  . Years of education: 12th   Occupational History  . Retired    Social History Main Topics  . Smoking status: Former Smoker    Quit date: 08/19/1961  . Smokeless tobacco: Former Systems developer    Types: Chew  . Alcohol use No  . Drug use: No  . Sexual activity: No   Other Topics Concern  . Not on file   Social History Narrative   Lives with wife.    Review of Systems  All other systems reviewed and are negative.      Objective:   Physical Exam  Constitutional: He appears well-developed and well-nourished.  Neck: No JVD present.  Cardiovascular: Normal rate.  An irregularly irregular rhythm present.  No murmur heard. Pulmonary/Chest: Effort normal and breath sounds normal. No respiratory distress. He has no wheezes. He has no rales.  Abdominal: Soft. He exhibits no distension and no mass. Bowel sounds are decreased. There is no tenderness. There is no rebound and no guarding.  Musculoskeletal: He  exhibits no edema.  Vitals reviewed.        Assessment & Plan:  Non-intractable cyclical vomiting with nausea - Plan:  CBC with Differential/Platelet  Atypical atrial flutter (HCC)  Elevated lipase - Plan: CBC with Differential/Platelet, Lipase  Hyponatremia - Plan: CBC with Differential/Platelet, COMPLETE METABOLIC PANEL WITH GFR  I believe the patient's nausea could be due to a combination of hyponatremia along with possible gastroparesis. He seems to responded to fluid restriction coupled with Reglan 3 times a day. I will repeat a CMP to monitor sodium level. Once his sodium level is normal, I will have the patient discontinue Reglan. If nausea returns, I would recommend a GI consultation to evaluate for gastroparesis. I will repeat a lipase today. I believe his elevated lipase is likely due to vomiting. He is scheduled to see a cardiologist in August guarding his atrial flutter.

## 2015-10-20 NOTE — Addendum Note (Signed)
Addended by: Shary Decamp B on: 10/20/2015 01:03 PM   Modules accepted: Orders

## 2015-10-22 ENCOUNTER — Other Ambulatory Visit: Payer: Self-pay | Admitting: Family Medicine

## 2015-10-22 NOTE — Telephone Encounter (Signed)
Ok to refill 

## 2015-10-23 NOTE — Telephone Encounter (Signed)
ok 

## 2015-10-23 NOTE — Telephone Encounter (Signed)
Medication called to pharmacy. 

## 2015-10-24 ENCOUNTER — Other Ambulatory Visit: Payer: Self-pay | Admitting: Family Medicine

## 2015-10-26 NOTE — Progress Notes (Signed)
Cardiology Office Note   Date:  10/27/2015   ID:  [name], DOB 09-17-1933, MRN [id number]  PCP:  Daniel Fraction, MD  Cardiologist:   Minus Breeding, MD   Chief Complaint  Patient presents with  . Edema  . Atrial Flutter      History of Present Illness: [name] is a 82 y.o. male who presents for followup of coronary disease. I last saw him in 2015.  After his catheterization in November 2014 he had a CVA. This was embolic following the procedure. He has had a significant recovery from this but still has some visual deficits on his right side and right-sided arm and leg weakness.  He presents after a two-year absence because he was having some lower extremity swelling recently. He was seen by [facility] TOM, MD and was noted to have atrial flutter. I reviewed these records. He didn't know he was in this rhythm. He had some extremity swelling and was given some Lasix. However, he subsequently developed a low sodium. He's referred here for evaluation of these issues. Of note he's had GI issues with diarrhea predominantly. He had a CT scan and I reviewed all of these records. There was plans for an echocardiogram this did not happen.  The patient describes feeling weak. He's been off balance. He's had chronic diarrhea which has been he says a significant problem since his stroke. He doesn't notice any palpitations and he's had no presyncope or syncope. He's not describing PND or orthopnea. He has chronic dyspnea but is very sedentary because of balance issues.   The patient denies any new symptoms such as chest discomfort, neck or arm discomfort.   Past Medical History:  Diagnosis Date  . Atrial flutter ([place])   . BPH (benign prostatic hyperplasia)   . Coronary artery disease    a. s/p MI tx with Cypher DES to pRCA in 4/04;  b. Echocardiogram 7/09: Normal LV function.  c. Nuclear study 3/13 no ischemia;  d. ETT 2/14 neg;  e. admx with CP => LHC (01/30/2013):  pLAD  40-50%, oD1 70-80%, oOM1 40%, pRCA stent patent, mid RCA 30%, EF 60-65%. => med Rx.  . DDD (degenerative disc disease)   . Diabetes mellitus without complication ([place])   . Dyslipidemia   . Hemorrhoids   . Hx MRSA infection    left buttocks abscess  . Hx of echocardiogram    a. Echocardiogram (01/31/2013): Mild focal basal and mild concentric hypertrophy of the septum, EF 50-55%, normal wall motion, grade 1 diastolic dysfunction, trivial AI, MAC, mild LAE, PASP 35  . Hyperlipidemia   . Hypertension   . Hypothyroidism   . Meniere's disease    Status post shunt  . Myocardial infarction ([name]) 2008  . Occlusion and stenosis of carotid artery without mention of cerebral infarction    40-59% on carotid doppler 2014; [place] (01/2013): R 1-39%, L 60-79%  . Rotator cuff injury    chronic rotator cuff injury status post repair  . Seizure disorder ([place])   . Stroke [facility])    a. 01/2013=> post cardiac cath CVA to L post communicating artery system; R sided weakness  . Syncope     Past Surgical History:  Procedure Laterality Date  . COLONOSCOPY    . CORONARY ANGIOPLASTY WITH STENT PLACEMENT  07/14/2002   Stent to the right coronary artery  . ENDOLYMPHATIC SHUNT DECOMPRESSION  06/11/2009    Right endolymphatic sac decompression and shunt  placement  .  HERNIA REPAIR  04/10/2008   scrotal hernia repair  . LEFT HEART CATHETERIZATION WITH CORONARY ANGIOGRAM N/A 01/30/2013   Procedure: LEFT HEART CATHETERIZATION WITH CORONARY ANGIOGRAM;  Surgeon: Larey Dresser, MD;  Location: [facility] CATH LAB;  Service: Cardiovascular;  Laterality: N/A;  . ROTATOR CUFF REPAIR     for chronic rotator cuff injury     Current Outpatient Prescriptions  Medication Sig Dispense Refill  . atorvastatin (LIPITOR) 80 MG tablet TAKE 1 TABLET BY MOUTH EVERY DAY 90 tablet 3  . carbamazepine (CARBATROL) 300 MG 12 hr capsule Take 1 capsule (300 mg total) by mouth 2 (two) times daily. 180 capsule 3  . cloNIDine (CATAPRES) 0.1 MG  tablet TAKE 1 TABLET TWICE A DAY 60 tablet 6  . diazepam (VALIUM) 2 MG tablet TAKE 1 TABLET BY MOUTH TWICE DAILY 60 tablet 2  . levothyroxine (SYNTHROID, LEVOTHROID) 25 MCG tablet TAKE 2 TABLETS BY MOUTH DAILY BEFORE BREAKFAST 30 tablet 11  . losartan (COZAAR) 100 MG tablet TAKE 1/2 TABLET BY MOUTH 2 TIMES A DAY 30 tablet 1  . metFORMIN (GLUCOPHAGE) 500 MG tablet TAKE 1 TABLET TWICE A DAY WITH A MEAL 180 tablet 3  . metoprolol (LOPRESSOR) 50 MG tablet TAKE 1 TABLET BY MOUTH TWICE A DAY 60 tablet 11  . Multiple Vitamins-Minerals (MULTIVITAMINS THER. W/MINERALS) TABS Take 1 tablet by mouth at bedtime.      Marland Kitchen NITROSTAT 0.4 MG SL tablet PLACE 1 TABLET UNDER TONGUE EVERY 5 MINS AS NEEDED FOR CHEST PAIN 25 tablet 0  . silodosin (RAPAFLO) 8 MG CAPS capsule Take 1 capsule (8 mg total) by mouth daily with breakfast. 30 capsule 5  . isosorbide mononitrate (IMDUR) 30 MG 24 hr tablet Take 1 tablet by mouth daily.    . metoCLOPramide (REGLAN) 10 MG tablet Take 1 tablet by mouth 3 (three) times daily.    . pantoprazole (PROTONIX) 40 MG tablet Take 1 tablet by mouth daily.    . rivaroxaban (XARELTO) 20 MG TABS tablet Take 1 tablet (20 mg total) by mouth daily with supper. 30 tablet 11   No current facility-administered medications for this visit.     Allergies:   Review of patient's allergies indicates no known allergies.   ROS:  Please see the history of present illness.   Otherwise, review of systems are positive for GI problems.   All other systems are reviewed and negative.    PHYSICAL EXAM: VS:  BP (!) 154/90 (BP Location: Left Arm, Patient Position: Sitting, Cuff Size: Large)   Pulse 82   Ht 5\' 7"  (1.702 m)   Wt 218 lb 2 oz (98.9 kg)   BMI 34.16 kg/m  , BMI Body mass index is 34.16 kg/m. GENERAL:  Well appearing HEENT:  Pupils equal round and reactive, fundi not visualized, oral mucosa unremarkable NECK:  No jugular venous distention, waveform within normal limits, carotid upstroke brisk and  symmetric, no bruits, no thyromegaly LYMPHATICS:  No cervical, inguinal adenopathy LUNGS:  Clear to auscultation bilaterally BACK:  No CVA tenderness CHEST:  Unremarkable HEART:  PMI not displaced or sustained,S1 and S2 within normal limits, no S3, no S4, no clicks, no rubs, no murmurs ABD:  Flat, positive bowel sounds normal in frequency in pitch, no bruits, no rebound, no guarding, no midline pulsatile mass, no hepatomegaly, no splenomegaly EXT:  2 plus pulses throughout, no edema, no cyanosis no clubbing, large right forearm lipoma.   SKIN:  No rashes no nodules NEURO:  Cranial nerves II  through XII grossly intact, motor grossly intact throughout, mild weakness PSYCH:  Cognitively intact, oriented to person place and time    EKG:  EKG is ordered today. The ekg ordered today demonstrates sinus rhythm, rate 71, left axis deviation, borderline interventricular conduction delay, no acute ST-T wave changes.   Recent Labs: 10/16/2015: TSH 3.98 10/20/2015: ALT 15; BUN 17; Creat 0.81; Hemoglobin 11.2; Platelets 292; Potassium 4.7; Sodium 128    Lipid Panel    Component Value Date/Time   CHOL 169 11/15/2014 0848   TRIG 139 11/15/2014 0848   HDL 50 11/15/2014 0848   CHOLHDL 3.4 11/15/2014 0848   VLDL 28 11/15/2014 0848   LDLCALC 91 11/15/2014 0848      Wt Readings from Last 3 Encounters:  10/27/15 218 lb 2 oz (98.9 kg)  10/20/15 221 lb (100.2 kg)  10/16/15 216 lb (98 kg)      Other studies Reviewed: Additional studies/ records that were reviewed today include: Extensive office records with imaging review and review of previous EKGs.   (Greater than 40 minutes reviewing all data with greater than 50% face to face with the patient). Review of the above records demonstrates:  Please see elsewhere in the note.     ASSESSMENT AND PLAN:  Flutter -  This is was a new diagnosis. He was not in this in May. He's going to need to be on Xarelto. Mr. Taha Waldbillig Socarras has a CHA2DS2 - VASc  score of 7 with a risk of stroke of 9.7%.  He will stop his Plavix. He understands the risk of bleeding with this medication versus the benefit to prevent stroke. He's now in sinus rhythm. I'm going to apply a 48 hour Holter to see if he is having active paroxysms of this.  I will also check an echo.   Anemia -    He does have a mild anemia but he does not have active bleeding. I'll check a CBC in 1 week.  Coronary artery disease -  The patient has no new sypmtoms.  No further cardiovascular testing is indicated.  We will continue with aggressive risk reduction and meds as listed.  Hypertension -  His BP is slightly high today.  However, this has not been the usual.  He will continue on the meds as listed.    CVA-  Med changes as above.   Dyslipidemia -  I will follow this in the future.  Lab Results  Component Value Date   CHOL 169 11/15/2014   TRIG 139 11/15/2014   HDL 50 11/15/2014   LDLCALC 91 11/15/2014    Carotid stenosis - The patient had 60 - 79% stenosis in 2014 on follow up.  I will talk to him about this at the next visit and if he has not had follow up with Dr. Leonie Man recently I will arrange a Doppler.  Hyponatremia - He will come back for a BMET.     Current medicines are reviewed at length with the patient today.  The patient does not have concerns regarding medicines.  The following changes have been made:  As avoe  Labs/ tests ordered today include:   Orders Placed This Encounter  Procedures  . CBC  . Basic Metabolic Panel (BMET)  . Holter monitor - 48 hour  . EKG 12-Lead  . ECHOCARDIOGRAM COMPLETE     Disposition:   FU with me in one month.     Signed, Minus Breeding, MD  10/27/2015 12:35 PM  [facility]

## 2015-10-27 ENCOUNTER — Ambulatory Visit (INDEPENDENT_AMBULATORY_CARE_PROVIDER_SITE_OTHER): Payer: Medicare Other | Admitting: Cardiology

## 2015-10-27 ENCOUNTER — Encounter: Payer: Self-pay | Admitting: Cardiology

## 2015-10-27 VITALS — BP 154/90 | HR 82 | Ht 67.0 in | Wt 218.1 lb

## 2015-10-27 DIAGNOSIS — I483 Typical atrial flutter: Secondary | ICD-10-CM

## 2015-10-27 DIAGNOSIS — E871 Hypo-osmolality and hyponatremia: Secondary | ICD-10-CM

## 2015-10-27 DIAGNOSIS — Z79899 Other long term (current) drug therapy: Secondary | ICD-10-CM | POA: Diagnosis not present

## 2015-10-27 DIAGNOSIS — M7989 Other specified soft tissue disorders: Secondary | ICD-10-CM

## 2015-10-27 MED ORDER — RIVAROXABAN 20 MG PO TABS
20.0000 mg | ORAL_TABLET | Freq: Every day | ORAL | 11 refills | Status: DC
Start: 2015-10-27 — End: 2015-12-26

## 2015-10-27 NOTE — Patient Instructions (Signed)
Medication Instructions:  STOP Clopidogrel and START Xarelto 20 mg daily  Labwork: IN 7 DAYS CBC and BMP  Testing/Procedures: Your physician has requested that you have an echocardiogram. Echocardiography is a painless test that uses sound waves to create images of your heart. It provides your doctor with information about the size and shape of your heart and how well your heart's chambers and valves are working. This procedure takes approximately one hour. There are no restrictions for this procedure.  Your physician has recommended that you wear a 48 hour holter monitor. Holter monitors are medical devices that record the heart's electrical activity. Doctors most often use these monitors to diagnose arrhythmias. Arrhythmias are problems with the speed or rhythm of the heartbeat. The monitor is a small, portable device. You can wear one while you do your normal daily activities. This is usually used to diagnose what is causing palpitations/syncope (passing out).  Follow-Up: Your physician recommends that you schedule a follow-up appointment in: 1 Month   Any Other Special Instructions Will Be Listed Below (If Applicable).   If you need a refill on your cardiac medications before your next appointment, please call your pharmacy.

## 2015-10-28 DIAGNOSIS — L821 Other seborrheic keratosis: Secondary | ICD-10-CM | POA: Diagnosis not present

## 2015-10-28 DIAGNOSIS — L814 Other melanin hyperpigmentation: Secondary | ICD-10-CM | POA: Diagnosis not present

## 2015-10-28 DIAGNOSIS — D225 Melanocytic nevi of trunk: Secondary | ICD-10-CM | POA: Diagnosis not present

## 2015-10-28 DIAGNOSIS — L820 Inflamed seborrheic keratosis: Secondary | ICD-10-CM | POA: Diagnosis not present

## 2015-10-28 DIAGNOSIS — L570 Actinic keratosis: Secondary | ICD-10-CM | POA: Diagnosis not present

## 2015-10-30 ENCOUNTER — Ambulatory Visit (INDEPENDENT_AMBULATORY_CARE_PROVIDER_SITE_OTHER): Payer: Medicare Other | Admitting: Family Medicine

## 2015-10-30 VITALS — BP 130/80 | HR 68 | Temp 98.2°F | Resp 18 | Ht 65.0 in | Wt 218.0 lb

## 2015-10-30 DIAGNOSIS — I484 Atypical atrial flutter: Secondary | ICD-10-CM

## 2015-10-30 DIAGNOSIS — G43A0 Cyclical vomiting, not intractable: Secondary | ICD-10-CM

## 2015-10-30 DIAGNOSIS — E871 Hypo-osmolality and hyponatremia: Secondary | ICD-10-CM | POA: Diagnosis not present

## 2015-10-30 DIAGNOSIS — R1115 Cyclical vomiting syndrome unrelated to migraine: Secondary | ICD-10-CM

## 2015-10-30 LAB — COMPLETE METABOLIC PANEL WITH GFR
ALT: 18 U/L (ref 9–46)
AST: 18 U/L (ref 10–35)
Albumin: 4 g/dL (ref 3.6–5.1)
Alkaline Phosphatase: 75 U/L (ref 40–115)
BUN: 15 mg/dL (ref 7–25)
CO2: 30 mmol/L (ref 20–31)
Calcium: 9.3 mg/dL (ref 8.6–10.3)
Chloride: 103 mmol/L (ref 98–110)
Creat: 0.89 mg/dL (ref 0.70–1.11)
GFR, Est African American: >89 mL/min (ref >=60)
GFR, Est Non African American: 81 mL/min (ref >=60)
Glucose, Bld: 146 mg/dL — ABNORMAL HIGH (ref 70–99)
Potassium: 4.7 mmol/L (ref 3.5–5.3)
Sodium: 138 mmol/L (ref 135–146)
Total Bilirubin: 0.4 mg/dL (ref 0.2–1.2)
Total Protein: 6 g/dL — ABNORMAL LOW (ref 6.1–8.1)

## 2015-10-30 LAB — CBC WITH DIFFERENTIAL/PLATELET
Basophils Absolute: 0 cells/uL (ref 0–200)
Basophils Relative: 0 %
Eosinophils Absolute: 410 cells/uL (ref 15–500)
Eosinophils Relative: 5 %
HCT: 35.2 % — ABNORMAL LOW (ref 38.5–50.0)
Hemoglobin: 11.9 g/dL — ABNORMAL LOW (ref 13.0–17.0)
Lymphocytes Relative: 24 %
Lymphs Abs: 1968 cells/uL (ref 850–3900)
MCH: 29.8 pg (ref 27.0–33.0)
MCHC: 33.8 g/dL (ref 32.0–36.0)
MCV: 88.2 fL (ref 80.0–100.0)
MPV: 9.4 fL (ref 7.5–12.5)
Monocytes Absolute: 738 cells/uL (ref 200–950)
Monocytes Relative: 9 %
Neutro Abs: 5084 cells/uL (ref 1500–7800)
Neutrophils Relative %: 62 %
Platelets: 300 10*3/uL (ref 140–400)
RBC: 3.99 MIL/uL — ABNORMAL LOW (ref 4.20–5.80)
RDW: 14.3 % (ref 11.0–15.0)
WBC: 8.2 10*3/uL (ref 3.8–10.8)

## 2015-10-30 LAB — TSH: TSH: 4.71 mIU/L — ABNORMAL HIGH (ref 0.40–4.50)

## 2015-10-30 NOTE — Progress Notes (Signed)
Subjective:    Patient ID: [name], male    DOB: [birth year], 82 y.o.   MRN: [id number]  HPI  Review of Systems  All other systems reviewed and are negative.      Objective:   Physical Exam  Constitutional: He appears well-developed and well-nourished.  Neck: No JVD present.  Cardiovascular: Normal rate, regular rhythm and normal heart sounds.   No murmur heard. Pulmonary/Chest: Effort normal and breath sounds normal. No respiratory distress. He has no wheezes. He has no rales.  Abdominal: Soft. Bowel sounds are normal. He exhibits no distension and no mass. There is no tenderness. There is no rebound and no guarding.  Musculoskeletal: He exhibits no edema.  Vitals reviewed.    Assessment & Plan:     Subjective:    Patient ID: [name], male    DOB: 05-05-1933, 82 y.o.   MRN: [id number]  HPI 10/06/15 Please see my last office visit. Since that time, the patient has gained 3 pounds. He does have trace to +1 bipedal edema to the mid shin. He is concerned that he is retaining fluid. On examination today, he does have by basilar Rales. He denies any chest pain, shortness of breath, dyspnea on exertion, orthopnea. However on his examination today, his heart rate is irregularly irregular. It is difficult to tell if these are frequent PVCs versus true atrial fibrillation. Therefore I would like to obtain an EKG. Last echocardiogram of the heart was in 2014 and revealed an ejection fraction of 50-55%.  At that time, my plan was: EKG confirms an irregularly irregular rhythm. There are definite P waves. However pattern appear to be atrial flutter versus normal sinus rhythm with frequent PVCs. I believe this atrial flutter. Furthermore there are new ST changes in lead 1 that was not present compared to an EKG in May of this year. This is a significant change from his EKG in May. I believe that the patient has developed atrial fibrillation versus atrial flutter and has developed  pulmonary edema and fluid retention secondary to this. Begin Lasix 40 mg by mouth daily for fluid retention. Patient is already on Plavix given his history of stroke and ASCVD. Given the fact it appears that he is in atrial flutter particularly lead 2, I do not believe he would benefit from taking Coumadin in addition to the Plavix versus the benefit he would get if he were in atrial fibrillation. Furthermore his heart rate is controlled at the present time. Therefore, only start lasix andconsult his cardiologist and obtain an echocardiogram of the heart to evaluate for a depressed ejection fraction. Recheck in one week after gentle diuresis. Repeat EKG at that time as well.  10/13/15 Patient is not feeling any better.  Became nauseated yesterday. However he has lost 5 pounds since his last office visit. His lungs are clear. The edema in his legs is gone. However he has an irregularly irregular heart rate today. It sounds different from a flutter. I would like to repeat an EKG to verify the rhythm. He is taking Lasix and hydrochlorothiazide. I have recommended that he stop hydrochlorothiazide.  At that time, my plan was: EKG today confirms atrial flutter. Therefore we will continue Plavix. Patient is scheduled to see his cardiologist in August. Heart rate is well controlled. He is complaining of some nausea. I believe it may be due to the fact I have dehydrated him as he has lost 5 pounds in less than a week.  Cut  Lasix to 20 mg a day and discontinue hydrochlorothiazide. Recheck in one week or sooner if worse  10/16/15 Patient states that he is feeling worse. He is now having intractable nausea. The nausea is constant but within 1 hour of eating he is vomiting. He has vomited 3 or 4 times in the last few days. He states that he has not vomited in 20 years prior to this. He denies any constipation. In fact he is having diarrhea which is chronic over the last 3 years. He denies any abdominal pain. He denies any  fevers or chills. He denies any blood in his stools or black tarry stools. On examination today the abdomen is soft nontender nondistended but very hypoactive bowel sounds. He also has some faint right basilar crackles. He continues to remain an irregularly irregular rhythm. There is no pedal edema or evidence of fluid overload.  At that time, my plan was: Patient fell recently. He denies any dizziness or syncope. His feet became tangled up by walking and he fell. However he also is having constant nausea which seems to be getting worse. I will check a CBC, CMP, lipase, and a TSH.  I will also check a chest x-ray as well as a two-view of the abdomen. Differential diagnosis includes ileus, diabetic gastroparesis, anxiety, or intra-abdominal process. His CBC is normal, lipase is normal, liver function tests are normal, and the abdomen x-ray reveals no ileus or obstruction, I will treat the patient empirically with Reglan 10 mg 3 times a day and recheck next week. 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 He states that he feels better. His weight is up to 221 pounds. He is eating better with less nausea. He reports just a little bit of nausea but no or near as severe as last week. His lab work was significant for hyponatremia and we have recommended fluid restriction to less than 1200 mL per day to manage this. His lab work was also significant for an elevated lipase of greater than 300. Therefore I obtained a CT scan of the abdomen and pelvis which revealed no evidence of pancreatitis or pancreatic cancer. Patient is still taking Reglan 3 times a day seems to help substantially with the nausea along with fluid restriction.  AT that time, my plan was:I believe the patient's nausea could be due to a combination of hyponatremia along with possible gastroparesis. He seems to  responded to fluid restriction coupled with Reglan 3 times a day. I will repeat a CMP to monitor sodium level. Once his sodium level is normal, I will have the patient discontinue Reglan. If nausea returns, I would recommend a GI consultation to evaluate for gastroparesis. I will repeat a lipase today. I believe his elevated lipase is likely due to vomiting. He is scheduled to see a cardiologist in August guarding his atrial flutter  10/1015 Repeat sodium level was 128. He is here today to follow-up. Since I last saw him he is seen his cardiologist who recommended starting him on anticoagulation. Therefore they discontinued his Plavix and put him on Xarelto.  He is doing much better today. Nausea and vomiting have subsided. His blood pressure is better. On his examination today he is actually in sinus rhythm now. He has spontaneously converted. He no longer feels weak or tired or nauseated. Past Medical History:  Diagnosis Date  .  Atrial flutter ([place])   . BPH (benign prostatic hyperplasia)   . Coronary artery disease    a. s/p MI tx with Cypher DES to pRCA in 4/04;  b. Echocardiogram 7/09: Normal LV function.  c. Nuclear study 3/13 no ischemia;  d. ETT 2/14 neg;  e. admx with CP => LHC (01/30/2013):  pLAD 40-50%, oD1 70-80%, oOM1 40%, pRCA stent patent, mid RCA 30%, EF 60-65%. => med Rx.  . DDD (degenerative disc disease)   . Diabetes mellitus without complication ([place])   . Dyslipidemia   . Hemorrhoids   . Hx MRSA infection    left buttocks abscess  . Hx of echocardiogram    a. Echocardiogram (01/31/2013): Mild focal basal and mild concentric hypertrophy of the septum, EF 50-55%, normal wall motion, grade 1 diastolic dysfunction, trivial AI, MAC, mild LAE, PASP 35  . Hyperlipidemia   . Hypertension   . Hypothyroidism   . Meniere's disease    Status post shunt  . Myocardial infarction ([place]) 2008  . Occlusion and stenosis of carotid artery without mention of cerebral infarction    40-59% on  carotid doppler 2014; [place] (01/2013): R 1-39%, L 60-79%  . Rotator cuff injury    chronic rotator cuff injury status post repair  . Seizure disorder ([place])   . Stroke [facility])    a. 01/2013=> post cardiac cath CVA to L post communicating artery system; R sided weakness  . Syncope    Past Surgical History:  Procedure Laterality Date  . COLONOSCOPY    . CORONARY ANGIOPLASTY WITH STENT PLACEMENT  07/14/2002   Stent to the right coronary artery  . ENDOLYMPHATIC SHUNT DECOMPRESSION  06/11/2009    Right endolymphatic sac decompression and shunt  placement  . HERNIA REPAIR  04/10/2008   scrotal hernia repair  . LEFT HEART CATHETERIZATION WITH CORONARY ANGIOGRAM N/A 01/30/2013   Procedure: LEFT HEART CATHETERIZATION WITH CORONARY ANGIOGRAM;  Surgeon: Larey Dresser, MD;  Location[facility] C[facility] LAB;  Service: Cardiovascular;  Laterality: N/A;  . ROTATOR CUFF REPAIR     for chronic rotator cuff injury   Current Outpatient Prescriptions on File Prior to Visit  Medication Sig Dispense Refill  . atorvastatin (LIPITOR) 80 MG tablet TAKE 1 TABLET BY MOUTH EVERY DAY 90 tablet 3  . carbamazepine (CARBATROL) 300 MG 12 hr capsule Take 1 capsule (300 mg total) by mouth 2 (two) times daily. 180 capsule 3  . cloNIDine (CATAPRES) 0.1 MG tablet TAKE 1 TABLET TWICE A DAY 60 tablet 6  . diazepam (VALIUM) 2 MG tablet TAKE 1 TABLET BY MOUTH TWICE DAILY 60 tablet 2  . isosorbide mononitrate (IMDUR) 30 MG 24 hr tablet Take 1 tablet by mouth daily.    Marland Kitchen levothyroxine (SYNTHROID, LEVOTHROID) 25 MCG tablet TAKE 2 TABLETS BY MOUTH DAILY BEFORE BREAKFAST 30 tablet 11  . losartan (COZAAR) 100 MG tablet TAKE 1/2 TABLET BY MOUTH 2 TIMES A DAY 30 tablet 1  . metFORMIN (GLUCOPHAGE) 500 MG tablet TAKE 1 TABLET TWICE A DAY WITH A MEAL 180 tablet 3  . metoCLOPramide (REGLAN) 10 MG tablet Take 1 tablet by mouth 3 (three) times daily.    . metoprolol (LOPRESSOR) 50 MG tablet TAKE 1 TABLET BY MOUTH TWICE A DAY 60 tablet 11  . Multiple  Vitamins-Minerals (MULTIVITAMINS THER. W/MINERALS) TABS Take 1 tablet by mouth at bedtime.      Marland Kitchen NITROSTAT 0.4 MG SL tablet PLACE 1 TABLET UNDER TONGUE EVERY 5 MINS AS NEEDED FOR CHEST PAIN 25  tablet 0  . pantoprazole (PROTONIX) 40 MG tablet Take 1 tablet by mouth daily.    . rivaroxaban (XARELTO) 20 MG TABS tablet Take 1 tablet (20 mg total) by mouth daily with supper. 30 tablet 11  . silodosin (RAPAFLO) 8 MG CAPS capsule Take 1 capsule (8 mg total) by mouth daily with breakfast. 30 capsule 5   No current facility-administered medications on file prior to visit.    No Known Allergies Social History   Social History  . Marital status: Married    Spouse name: N/A  . Number of children: 4  . Years of education: 12th   Occupational History  . Retired    Social History Main Topics  . Smoking status: Former Smoker    Quit date: 08/19/1961  . Smokeless tobacco: Former Systems developer    Types: Chew  . Alcohol use No  . Drug use: No  . Sexual activity: No   Other Topics Concern  . Not on file   Social History Narrative   Lives with wife.    Assessment & Plan:  .Non-intractable cyclical vomiting with nausea - Plan: CBC with Differential/Platelet, COMPLETE METABOLIC PANEL WITH GFR  Atypical atrial flutter ([place]) - Plan: TSH  Hyponatremia - Plan: CBC with Differential/Platelet, COMPLETE METABOLIC PANEL WITH GFR  Patient has spontaneously converted back to sinus rhythm. He subsequently feels better. I believe his hyponatremia has likely resolved. I will be lab work to ensure that his hyponatremia has completely resolved. I will also check his TSH to ensure that his thyroid medication is not contributing to his atrial flutter. I will make no changes in his medication at this time. I did ask the patient to discontinue Reglan now that he's feeling better. He can certainly use it as needed in the future.

## 2015-10-31 ENCOUNTER — Other Ambulatory Visit: Payer: Self-pay | Admitting: Family Medicine

## 2015-10-31 MED ORDER — LEVOTHYROXINE SODIUM 75 MCG PO TABS: 75.0000 ug | ORAL_TABLET | Freq: Every day | ORAL | 3 refills | Status: DC

## 2015-11-03 ENCOUNTER — Encounter: Payer: Self-pay | Admitting: Internal Medicine

## 2015-11-03 ENCOUNTER — Ambulatory Visit (INDEPENDENT_AMBULATORY_CARE_PROVIDER_SITE_OTHER): Payer: Medicare Other | Admitting: Internal Medicine

## 2015-11-03 VITALS — BP 150/88 | HR 76 | Ht 65.0 in | Wt 221.0 lb

## 2015-11-03 DIAGNOSIS — K591 Functional diarrhea: Secondary | ICD-10-CM

## 2015-11-03 NOTE — Patient Instructions (Signed)
Please follow up as needed 

## 2015-11-03 NOTE — Progress Notes (Signed)
HISTORY OF PRESENT ILLNESS:  [name] is a 82 y.o. male , retired Company secretary and previous patient of Dr. Olevia Perches, who was initially evaluated after self-referral (daughter) May 2017 for chronic diarrhea of nearly 3 years duration. See that dictation for details. He actually has problems with constipation and diarrhea intermittently. At time of his last visit is diarrhea was felt to be functional. 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 She has had narcotic related constipation. At time of his last visit daily Metamucil recommended. MiraLAX as needed for constipation. We discussed metformin as a possible cause for diarrhea. Also discussed undergarments or incontinence. He presents today for routine follow-up. He is pleased report that his problem has improved. Now has problems with loose bowels about once per week. No issues with constipation. He is taking fiber. Has not stopped metformin. No new issues.  REVIEW OF SYSTEMS:  All non-GI ROS negative upon comprehensive review  Past Medical History:  Diagnosis Date  . Atrial flutter ([place])   . BPH (benign prostatic hyperplasia)   . Coronary artery disease    a. s/p MI tx with Cypher DES to pRCA in 4/04;  b. Echocardiogram 7/09: Normal LV function.  c. Nuclear study 3/13 no ischemia;  d. ETT 2/14 neg;  e. admx with CP => LHC (01/30/2013):  pLAD 40-50%, oD1 70-80%, oOM1 40%, pRCA stent patent, mid RCA 30%, EF 60-65%. => med Rx.  . DDD (degenerative disc disease)   . Diabetes mellitus without complication ([place])   . Dyslipidemia   . Hemorrhoids   . Hx MRSA infection    left buttocks abscess  . Hx of echocardiogram    a. Echocardiogram (01/31/2013): Mild focal basal and mild concentric hypertrophy of the septum, EF 50-55%, normal wall motion, grade 1 diastolic dysfunction, trivial AI, MAC, mild LAE, PASP 35  . Hyperlipidemia   . Hypertension   . Hypothyroidism   . Meniere's disease    Status post shunt  .  Myocardial infarction ([place]) 2008  . Occlusion and stenosis of carotid artery without mention of cerebral infarction    40-59% on carotid doppler 2014; [place] (01/2013): R 1-39%, L 60-79%  . Rotator cuff injury    chronic rotator cuff injury status post repair  . Seizure disorder ([place])   . Stroke [facility])    a. 01/2013=> post cardiac cath CVA to L post communicating artery system; R sided weakness  . Syncope     Past Surgical History:  Procedure Laterality Date  . COLONOSCOPY    . CORONARY ANGIOPLASTY WITH STENT PLACEMENT  07/14/2002   Stent to the right coronary artery  . ENDOLYMPHATIC SHUNT DECOMPRESSION  06/11/2009    Right endolymphatic sac decompression and shunt  placement  . HERNIA REPAIR  04/10/2008   scrotal hernia repair  . LEFT HEART CATHETERIZATION WITH CORONARY ANGIOGRAM N/A 01/30/2013   Procedure: LEFT HEART CATHETERIZATION WITH CORONARY ANGIOGRAM;  Surgeon: Larey Dresser, MD;  Location: [facility] CATH LAB;  Service: Cardiovascular;  Laterality: N/A;  . ROTATOR CUFF REPAIR     for chronic rotator cuff injury    Social History Gretchen Watwood Tobler  reports that he quit smoking about 54 years ago. He has quit using smokeless tobacco. His smokeless tobacco use included Chew. He reports that he does not drink alcohol or use drugs.  family history includes Aneurysm in his mother; Coronary artery disease in his brother; Diabetes in his brother; Heart attack (age of onset: 21) in his father.  No Known Allergies     PHYSICAL EXAMINATION: Vital signs: BP (!) 150/88 (BP Location: Left Arm, Patient Position: Sitting, Cuff Size: Normal)   Pulse 76   Ht 5\' 5"  (1.651 m)   Wt 221 lb (100.2 kg)   BMI 36.78 kg/m  General: Well-developed, well-nourished, no acute distress HEENT: Sclerae are anicteric, conjunctiva pink. Oral mucosa intact Lungs: Clear Heart: Regular Abdomen: soft, Obese, nontender, nondistended, no obvious ascites, no peritoneal signs, normal bowel sounds. No  organomegaly. Extremities: NoClubbing cyanosis or edema Psychiatric: alert and oriented x3. Cooperative   ASSESSMENT:  #1. Alternating bowel habits with tendency toward diarrhea. Chronic. Improved after fiber institution   PLAN:  #1. Continue Metamucil. MiraLAX for rescue constipation. #2. GI follow-up as needed  15 minutes spent face-to-face with the patient. Greater than 50% a time use for counseling regarding his alternating bowel habits and their management.

## 2015-11-11 ENCOUNTER — Ambulatory Visit (INDEPENDENT_AMBULATORY_CARE_PROVIDER_SITE_OTHER): Payer: Medicare Other

## 2015-11-11 ENCOUNTER — Other Ambulatory Visit: Payer: Self-pay

## 2015-11-11 ENCOUNTER — Ambulatory Visit (HOSPITAL_COMMUNITY): Payer: Medicare Other | Attending: Cardiology

## 2015-11-11 ENCOUNTER — Encounter (HOSPITAL_COMMUNITY): Payer: Self-pay

## 2015-11-11 DIAGNOSIS — I059 Rheumatic mitral valve disease, unspecified: Secondary | ICD-10-CM | POA: Diagnosis not present

## 2015-11-11 DIAGNOSIS — I358 Other nonrheumatic aortic valve disorders: Secondary | ICD-10-CM | POA: Insufficient documentation

## 2015-11-11 DIAGNOSIS — I499 Cardiac arrhythmia, unspecified: Secondary | ICD-10-CM

## 2015-11-11 DIAGNOSIS — I2510 Atherosclerotic heart disease of native coronary artery without angina pectoris: Secondary | ICD-10-CM | POA: Diagnosis not present

## 2015-11-11 DIAGNOSIS — I119 Hypertensive heart disease without heart failure: Secondary | ICD-10-CM | POA: Diagnosis not present

## 2015-11-11 DIAGNOSIS — I483 Typical atrial flutter: Secondary | ICD-10-CM

## 2015-11-11 DIAGNOSIS — E119 Type 2 diabetes mellitus without complications: Secondary | ICD-10-CM | POA: Insufficient documentation

## 2015-11-11 DIAGNOSIS — E785 Hyperlipidemia, unspecified: Secondary | ICD-10-CM | POA: Diagnosis not present

## 2015-11-11 DIAGNOSIS — I4892 Unspecified atrial flutter: Secondary | ICD-10-CM | POA: Diagnosis present

## 2015-11-11 DIAGNOSIS — R601 Generalized edema: Secondary | ICD-10-CM

## 2015-11-11 DIAGNOSIS — R972 Elevated prostate specific antigen [PSA]: Secondary | ICD-10-CM | POA: Diagnosis not present

## 2015-11-12 ENCOUNTER — Telehealth: Payer: Self-pay | Admitting: Cardiology

## 2015-11-12 NOTE — Telephone Encounter (Signed)
xarelto 20mg  approved  PA Case [id number] is Approved

## 2015-11-13 ENCOUNTER — Ambulatory Visit: Payer: Medicare Other | Admitting: Cardiology

## 2015-11-19 ENCOUNTER — Encounter: Payer: Self-pay | Admitting: Cardiology

## 2015-12-03 NOTE — Progress Notes (Signed)
Cardiology Office Note   Date:  12/04/2015   ID:  [name], DOB 1933-07-14, MRN [id number]  PCP:  Odette Fraction, MD  Cardiologist:   Minus Breeding, MD   Chief Complaint  Patient presents with  . Atrial Flutter      History of Present Illness: [name] is a 82 y.o. male who presents for followup of coronary disease.  After his catheterization in November 2014 he had a CVA. This was embolic following the procedure. He has had a significant recovery from this but still has some visual deficits on his right side and right-sided arm and leg weakness.  I saw him recently after he was noted to be in atrial flutter.   Xarelto was started and his Plavix was stopped.   He was in sinus when I saw him and when he wore a Holter after that appt.  Echo was essentially unremarkable.  Since I last saw him he has done very well. The patient denies any new symptoms such as chest discomfort, neck or arm discomfort. There has been no new shortness of breath, PND or orthopnea. There have been no reported palpitations, presyncope or syncope.  He did not feel his flutter before and he does not been having this complaint.  He walks a little for exercise.  Marland Kitchen   Past Medical History:  Diagnosis Date  . Atrial flutter ([place])   . BPH (benign prostatic hyperplasia)   . Coronary artery disease    a. s/p MI tx with Cypher DES to pRCA in 4/04;  b. Echocardiogram 7/09: Normal LV function.  c. Nuclear study 3/13 no ischemia;  d. ETT 2/14 neg;  e. admx with CP => LHC (01/30/2013):  pLAD 40-50%, oD1 70-80%, oOM1 40%, pRCA stent patent, mid RCA 30%, EF 60-65%. => med Rx.  . DDD (degenerative disc disease)   . Diabetes mellitus without complication ([place])   . Dyslipidemia   . Hemorrhoids   . Hx MRSA infection    left buttocks abscess  . Hx of echocardiogram    a. Echocardiogram (01/31/2013): Mild focal basal and mild concentric hypertrophy of the septum, EF 50-55%, normal wall motion, grade 1 diastolic  dysfunction, trivial AI, MAC, mild LAE, PASP 35  . Hyperlipidemia   . Hypertension   . Hypothyroidism   . Meniere's disease    Status post shunt  . Myocardial infarction ([place]) 2008  . Occlusion and stenosis of carotid artery without mention of cerebral infarction    40-59% on carotid doppler 2014; [place] (01/2013): R 1-39%, L 60-79%  . Rotator cuff injury    chronic rotator cuff injury status post repair  . Seizure disorder ([place])   . Stroke [facility])    a. 01/2013=> post cardiac cath CVA to L post communicating artery system; R sided weakness  . Syncope     Past Surgical History:  Procedure Laterality Date  . COLONOSCOPY    . CORONARY ANGIOPLASTY WITH STENT PLACEMENT  07/14/2002   Stent to the right coronary artery  . ENDOLYMPHATIC SHUNT DECOMPRESSION  06/11/2009    Right endolymphatic sac decompression and shunt  placement  . HERNIA REPAIR  04/10/2008   scrotal hernia repair  . LEFT HEART CATHETERIZATION WITH CORONARY ANGIOGRAM N/A 01/30/2013   Procedure: LEFT HEART CATHETERIZATION WITH CORONARY ANGIOGRAM;  Surgeon: Larey Dresser, MD;  Location: [facility] CATH LAB;  Service: Cardiovascular;  Laterality: N/A;  . ROTATOR CUFF REPAIR     for chronic rotator cuff injury  Current Outpatient Prescriptions  Medication Sig Dispense Refill  . atorvastatin (LIPITOR) 80 MG tablet TAKE 1 TABLET BY MOUTH EVERY DAY 90 tablet 3  . carbamazepine (CARBATROL) 300 MG 12 hr capsule Take 1 capsule (300 mg total) by mouth 2 (two) times daily. 180 capsule 3  . cephALEXin (KEFLEX) 250 MG capsule Take 250 mg by mouth daily.    . cloNIDine (CATAPRES) 0.1 MG tablet TAKE 1 TABLET TWICE A DAY 60 tablet 6  . diazepam (VALIUM) 2 MG tablet TAKE 1 TABLET BY MOUTH TWICE DAILY 60 tablet 2  . isosorbide mononitrate (IMDUR) 30 MG 24 hr tablet Take 1 tablet by mouth daily.    Marland Kitchen levothyroxine (SYNTHROID, LEVOTHROID) 25 MCG tablet Take 25 mcg by mouth daily before breakfast.    . levothyroxine (SYNTHROID, LEVOTHROID) 75  MCG tablet Take 1 tablet (75 mcg total) by mouth daily. 30 tablet 3  . losartan (COZAAR) 100 MG tablet TAKE 1/2 TABLET BY MOUTH 2 TIMES A DAY 30 tablet 1  . metFORMIN (GLUCOPHAGE) 500 MG tablet TAKE 1 TABLET TWICE A DAY WITH A MEAL 180 tablet 3  . metoCLOPramide (REGLAN) 10 MG tablet Take 1 tablet by mouth 3 (three) times daily.    . metoprolol (LOPRESSOR) 50 MG tablet TAKE 1 TABLET BY MOUTH TWICE A DAY 60 tablet 11  . Multiple Vitamins-Minerals (MULTIVITAMINS THER. W/MINERALS) TABS Take 1 tablet by mouth at bedtime.      . pantoprazole (PROTONIX) 40 MG tablet Take 1 tablet by mouth daily.    . rivaroxaban (XARELTO) 20 MG TABS tablet Take 1 tablet (20 mg total) by mouth daily with supper. 30 tablet 11  . silodosin (RAPAFLO) 8 MG CAPS capsule Take 1 capsule (8 mg total) by mouth daily with breakfast. 30 capsule 5   No current facility-administered medications for this visit.     Allergies:   Review of patient's allergies indicates no known allergies.   ROS:  Please see the history of present illness.   Otherwise, review of systems are positive for GI problems unchanged and balance issues.   All other systems are reviewed and negative.    PHYSICAL EXAM: VS:  BP (!) 142/76 (BP Location: Left Arm, Patient Position: Sitting, Cuff Size: Normal)   Pulse (!) 53   Ht 5\' 8"  (1.727 m)   Wt 220 lb 4 oz (99.9 kg)   SpO2 97%   BMI 33.49 kg/m  , BMI Body mass index is 33.49 kg/m. GENERAL:  Well appearing NECK:  No jugular venous distention, waveform within normal limits, carotid upstroke brisk and symmetric, no bruits, no thyromegaly LUNGS:  Clear to auscultation bilaterally BACK:  No CVA tenderness CHEST:  Unremarkable HEART:  PMI not displaced or sustained,S1 and S2 within normal limits, no S3, no S4, no clicks, no rubs, no murmurs ABD:  Flat, positive bowel sounds normal in frequency in pitch, no bruits, no rebound, no guarding, no midline pulsatile mass, no hepatomegaly, no splenomegaly EXT:   2 plus pulses throughout, no edema, no cyanosis no clubbing, large right forearm lipoma.     EKG:  EKG is not ordered today.   Recent Labs: 10/30/2015: ALT 18; BUN 15; Creat 0.89; Hemoglobin 11.9; Platelets 300; Potassium 4.7; Sodium 138; TSH 4.71    Lipid Panel    Component Value Date/Time   CHOL 169 11/15/2014 0848   TRIG 139 11/15/2014 0848   HDL 50 11/15/2014 0848   CHOLHDL 3.4 11/15/2014 0848   VLDL 28 11/15/2014 0848  LDLCALC 91 11/15/2014 0848      Wt Readings from Last 3 Encounters:  12/04/15 220 lb 4 oz (99.9 kg)  11/03/15 221 lb (100.2 kg)  10/30/15 218 lb (98.9 kg)      Other studies Reviewed: Additional studies/ records that were reviewed today include: Labs, carotid Review of the above records demonstrates:  See elsewhere.     ASSESSMENT AND PLAN:  Flutter -  . [name] has a CHA2DS2 - VASc score of 7 with a risk of stroke of 9.7%.  This seems to be paroxysmal. He's tolerating anticoagulation. Of note he reports that he is not taking Plavix and we have given him specific instructions to go home and make sure that is not in his list or in his meds.  Anemia -    He does have a mild anemia but was stable on follow up labs after the last appt.  FOR a follow-up labs until I see him back to his primary provider.  Coronary artery disease -  The patient has no new sypmtoms.  No further cardiovascular testing is indicated.  We will continue with aggressive risk reduction and meds as listed.  Hypertension -  His BP is well controlled.  He will continue on the meds as listed.    CVA-  He has some residual symptoms.  No change in therapy is needed.    Dyslipidemia -  I will follow this up and have ordered labs.  Lab Results  Component Value Date   CHOL 169 11/15/2014   TRIG 139 11/15/2014   HDL 50 11/15/2014   LDLCALC 91 11/15/2014    Carotid stenosis - The patient had 60 - 79% stenosis in 2014 on follow up.  He needs follow up of this.  This  will be scheduled.    Hyponatremia - This had resolved when he was in the office and I checked a BMET.     Current medicines are reviewed at length with the patient today.  The patient does not have concerns regarding medicines.  The following changes have been made:  None  Labs/ tests ordered today include:   Orders Placed This Encounter  Procedures  . Lipid panel  . Hepatic function panel     Disposition:   FU with me in 6 months.     Signed, Minus Breeding, MD  12/04/2015 10:41 AM    [facility]

## 2015-12-04 ENCOUNTER — Ambulatory Visit (INDEPENDENT_AMBULATORY_CARE_PROVIDER_SITE_OTHER): Payer: Medicare Other | Admitting: Cardiology

## 2015-12-04 ENCOUNTER — Encounter: Payer: Self-pay | Admitting: Cardiology

## 2015-12-04 VITALS — BP 142/76 | HR 53 | Ht 68.0 in | Wt 220.2 lb

## 2015-12-04 DIAGNOSIS — I483 Typical atrial flutter: Secondary | ICD-10-CM | POA: Diagnosis not present

## 2015-12-04 DIAGNOSIS — I6523 Occlusion and stenosis of bilateral carotid arteries: Secondary | ICD-10-CM | POA: Diagnosis not present

## 2015-12-04 DIAGNOSIS — E785 Hyperlipidemia, unspecified: Secondary | ICD-10-CM | POA: Diagnosis not present

## 2015-12-04 DIAGNOSIS — Z79899 Other long term (current) drug therapy: Secondary | ICD-10-CM | POA: Diagnosis not present

## 2015-12-04 NOTE — Patient Instructions (Signed)
Medication Instructions:  Continue current medications  Labwork: Fasting Lipid Liver  Testing/Procedures: Your physician has requested that you have a carotid duplex. This test is an ultrasound of the carotid arteries in your neck. It looks at blood flow through these arteries that supply the brain with blood. Allow one hour for this exam. There are no restrictions or special instructions.  Follow-Up: Your physician wants you to follow-up in: 6 Months. You will receive a reminder letter in the mail two months in advance. If you don't receive a letter, please call our office to schedule the follow-up appointment.   Any Other Special Instructions Will Be Listed Below (If Applicable).   If you need a refill on your cardiac medications before your next appointment, please call your pharmacy.

## 2015-12-05 ENCOUNTER — Ambulatory Visit: Payer: Medicare Other | Attending: Family Medicine | Admitting: Rehabilitative and Restorative Service Providers"

## 2015-12-05 DIAGNOSIS — M6281 Muscle weakness (generalized): Secondary | ICD-10-CM

## 2015-12-05 DIAGNOSIS — R2681 Unsteadiness on feet: Secondary | ICD-10-CM | POA: Insufficient documentation

## 2015-12-05 DIAGNOSIS — R2689 Other abnormalities of gait and mobility: Secondary | ICD-10-CM | POA: Insufficient documentation

## 2015-12-05 NOTE — Patient Instructions (Signed)
Functional Quadriceps: Sit to Stand    Sit on edge of chair, feet flat on floor. Stand upright, extending knees fully.  You can use your hands at first.  Be sure to stand up tall each time.  Repeat ___5_ times per set. Do _2___ sets per session. Do __1-2__ sessions per day.  http://orth.exer.us/734   Copyright  VHI. All rights reserved.   Toe / Heel Raise (Standing)    Standing with support, raise heels, then rock back on heels and raise toes.  Only use one hand for support.  Repeat __20__ times. 1-2 times/day.  Copyright  VHI. All rights reserved.   Heel Cord Stretch    Place one leg forward, bent, other leg behind and straight. Lean forward keeping back heel flat. Hold _20___ seconds while counting out loud. Repeat with other leg. Repeat __3__ times. Do __1-2__ sessions per day.  http://gt2.exer.us/511   Copyright  VHI. All rights reserved.

## 2015-12-08 ENCOUNTER — Ambulatory Visit: Payer: Medicare Other | Admitting: Physical Therapy

## 2015-12-08 NOTE — Therapy (Signed)
[place] [address] [place], [place], [address] Phone: [phone]   Fax:  [phone]  Physical Therapy Evaluation  Patient Details  Name: [name] MRN: [id number] Date of Birth: [birth year] Referring Provider: Jenna Luo, MD  Encounter Date: 12/05/2015      PT End of Session - 12/07/15 0928    Visit Number 1   Number of Visits 10   Date for PT Re-Evaluation 02/03/16   Authorization Type G code every 10th visit   PT Start Time 1020   PT Stop Time 1100   PT Time Calculation (min) 40 min   Equipment Utilized During Treatment Gait belt   Activity Tolerance Patient tolerated treatment well   Behavior During Therapy [facility] for tasks assessed/performed      Past Medical History:  Diagnosis Date  . Atrial flutter ([place])   . BPH (benign prostatic hyperplasia)   . Coronary artery disease    a. s/p MI tx with Cypher DES to pRCA in 4/04;  b. Echocardiogram 7/09: Normal LV function.  c. Nuclear study 3/13 no ischemia;  d. ETT 2/14 neg;  e. admx with CP => LHC (01/30/2013):  pLAD 40-50%, oD1 70-80%, oOM1 40%, pRCA stent patent, mid RCA 30%, EF 60-65%. => med Rx.  . DDD (degenerative disc disease)   . Diabetes mellitus without complication ([place])   . Dyslipidemia   . Hemorrhoids   . Hx MRSA infection    left buttocks abscess  . Hx of echocardiogram    a. Echocardiogram (01/31/2013): Mild focal basal and mild concentric hypertrophy of the septum, EF 50-55%, normal wall motion, grade 1 diastolic dysfunction, trivial AI, MAC, mild LAE, PASP 35  . Hyperlipidemia   . Hypertension   . Hypothyroidism   . Meniere's disease    Status post shunt  . Myocardial infarction ([place]) 2008  . Occlusion and stenosis of carotid artery without mention of cerebral infarction    40-59% on carotid doppler 2014; [place] (01/2013): R 1-39%, L 60-79%  . Rotator cuff injury    chronic rotator cuff injury status post repair  . Seizure  disorder ([place])   . Stroke [facility])    a. 01/2013=> post cardiac cath CVA to L post communicating artery system; R sided weakness  . Syncope     Past Surgical History:  Procedure Laterality Date  . COLONOSCOPY    . CORONARY ANGIOPLASTY WITH STENT PLACEMENT  07/14/2002   Stent to the right coronary artery  . ENDOLYMPHATIC SHUNT DECOMPRESSION  06/11/2009    Right endolymphatic sac decompression and shunt  placement  . HERNIA REPAIR  04/10/2008   scrotal hernia repair  . LEFT HEART CATHETERIZATION WITH CORONARY ANGIOGRAM N/A 01/30/2013   Procedure: LEFT HEART CATHETERIZATION WITH CORONARY ANGIOGRAM;  Surgeon: Larey Dresser, MD;  Location: [facility] CATH LAB;  Service: Cardiovascular;  Laterality: N/A;  . ROTATOR CUFF REPAIR     for chronic rotator cuff injury    There were no vitals filed for this visit.       Subjective Assessment - 12/07/15 0926    Subjective The patient reports declining mobility since stroke in 2014.  He notes increasing # of falls reporting 2-3 over the past 6 months.  He only remembers the circumstances of the most recent fall noting getting off of lawn mower to move a stick and falling in the grass.  "My main concern is every time I get outside I feel like I'm goingn to fall."  Patient is accompained by: Family member  spouse   Patient Stated Goals Being able to walk in the yard without falling.  I've fallen 2-3 times and each time get hurt worse.   Currently in Pain? Yes   Pain Score --  varies "I don't want to use my arm in therapy"   Pain Location Shoulder   Pain Orientation Right   Pain Descriptors / Indicators Aching   Pain Type Chronic pain   Pain Frequency Intermittent   Aggravating Factors  depends on use of shoulder    Pain Relieving Factors not using   Effect of Pain on Daily Activities PT will monitor response to tx, but no goal to follow.            [facility] PT Assessment - 12/07/15 0932      Assessment   Medical Diagnosis LE weakness    Referring Provider Jenna Luo, MD   Onset Date/Surgical Date --  08/2015   Hand Dominance Right   Prior Therapy in the past, unable to recall dates.     Precautions   Precautions Fall     Restrictions   Weight Bearing Restrictions No     Balance Screen   Has the patient fallen in the past 6 months Yes   How many times? 3   Has the patient had a decrease in activity level because of a fear of falling?  Yes   Is the patient reluctant to leave their home because of a fear of falling?  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      Sensation   Light Touch Impaired by gross assessment  diminished light touch on right side   Additional Comments R hand and arm minimal numbness and tingling and R LE mild numbness since CVA.       Posture/Postural Control   Posture/Postural Control Postural limitations   Postural Limitations Rounded Shoulders     ROM / Strength   AROM / PROM / Strength AROM;Strength     AROM   Overall AROM Comments R UE uses for function below 90 degrees of flexion-- uses elbow flexion and hand to stabilize. LE WNLs.       Strength   Overall Strength Comments L UE 5/5, R UE not tested due to rotator cuff tear.  R hip flexion 3/5, L hip flexion 4/5, R knee flexion 4/5, L knee flexion 5/5, R knee extension 4/5, L knee extension 5/5, R ankle DF 4/5, L ankle DF 5/5.       Bed Mobility   Bed Mobility --   independent     Transfers   Transfers Sit to Stand     Ambulation/Gait   Ambulation/Gait Yes   Gait velocity 2.02 ft/sec   Stairs Yes   Stair Management Technique Two rails;Step to  pattern  R leg leads as strong leg ascending   Number of Stairs 4     Standardized Balance Assessment   Standardized Balance Assessment Berg Balance Test     Berg Balance Test   Sit to Stand Able to stand  independently using hands   Standing Unsupported Able to stand safely 2 minutes   Sitting with Back Unsupported but Feet Supported on Floor or Stool Able to sit safely and securely 2 minutes   Stand to Sit Controls descent by using hands   Transfers Able to transfer safely, definite need of hands   Standing Unsupported with Eyes Closed Able to stand 10 seconds with supervision   Standing Ubsupported with Feet Together Able to place feet together independently and stand for 1 minute with supervision   From Standing, Reach Forward with Outstretched Arm Can reach forward >5 cm safely (2")   From Standing Position, Pick up Object from Floor Able to pick up shoe, needs supervision   From Standing Position, Turn to Look Behind Over each Shoulder Turn sideways only but maintains balance   Turn 360 Degrees Needs close supervision or verbal cueing   Standing Unsupported, Alternately Place Feet on Step/Stool Able to complete >2 steps/needs minimal assist   Standing Unsupported, One Foot in Front Able to take small step independently and hold 30 seconds   Standing on One Leg Unable to try or needs assist to prevent fall   Total Score 34   Berg comment: 34/56 indicating high fall risk.       THERAPEUTIC EXERCISE: HEP- see patient education         PT Education - 12/07/15 [phone]    Education provided Yes   Education Details HEP: sit<>stand, heel/toe raises, heel cord stretch   Person(s) Educated Patient   Methods Explanation;Demonstration;Handout   Comprehension Verbalized understanding;Returned  demonstration          PT Short Term Goals - 12/07/15 0928      PT SHORT TERM GOAL #1   Title The patient will be indep with HEP for LE strength, balance, and mobility.   Baseline Target date 01/03/2016   Time 4   Period Weeks     PT SHORT TERM GOAL #2   Title The patient will improve Berg from 34/56 to > or equal to 39/56 to demo dec'ing risk for falls.   Baseline Target date 01/03/2016   Time 4   Period Weeks     PT SHORT TERM GOAL #3   Title The patient will improve gait speed from 2.02 ft/sec to > or equal to 2.4 ft/sec to demo improving functional mobility.   Baseline Target date 01/03/2016   Time 4   Period Weeks     PT SHORT TERM GOAL #4   Title The patient will negotiate 4 steps with reciprocal pattern with bilateral handrails.    Baseline Target date 01/03/2016   Time 4   Period Weeks           PT Long Term Goals - 12/07/15 [id number]      PT LONG TERM GOAL #1   Title The patient will be indep with post d/c progression of HEP.   Baseline Target date 02/03/2016   Time 8   Period Weeks     PT LONG TERM GOAL #2   Title The patient will improve Berg from 34/56 to > or equal to 42/56 to demo dec'ing risk for falls.   Baseline Target date 02/03/2016   Time  8   Period Weeks     PT LONG TERM GOAL #3   Title The patient will improve speed from 2.02 ft/sec to > or equal to 2.62 ft/sec   Baseline Target date 02/03/2016   Time 8   Period Weeks     PT LONG TERM GOAL #4   Title The patient will negotiate unlevel community surfaces (including grass, inclines, curbs) without a device independently.   Baseline Target date 02/03/2016   Time 8   Period Weeks               Plan - 12/07/15 [id number]    Clinical Impression Statement The patient is an 82 yo male presenting to OP PT with LE weakness, declining mobility, and recent falls.  He does score at high fall risk per Merrilee Jansky balance score.  PT to progress activities to optimize current functional status.    Rehab  Potential Good   PT Frequency 2x / week   PT Duration 4 weeks  followed by 1x/week x 4 weeks if needed   PT Treatment/Interventions ADLs/Self Care Home Management;Therapeutic activities;Therapeutic exercise;Neuromuscular re-education;Balance training;Gait training;Functional mobility training;Patient/family education   PT Next Visit Plan Check HEP, balance training, unlevel surface negotiation, fall prevention, LE strengthening   Consulted and Agree with Plan of Care Patient;Family member/caregiver   Family Member Consulted spouse      Patient will benefit from skilled therapeutic intervention in order to improve the following deficits and impairments:  Abnormal gait, Difficulty walking, Decreased balance, Decreased mobility, Decreased strength  Visit Diagnosis: Other abnormalities of gait and mobility  Unsteadiness on feet  Muscle weakness (generalized)     Problem List Patient Active Problem List   Diagnosis Date Noted  . Special screening for malignant neoplasms, colon 12/07/2013  . Bowel habit changes 12/07/2013  . Diarrhea 03/09/2013  . Embolic cerebral infarction ([place]) 02/02/2013  . CVA (cerebral infarction) 01/31/2013  . Chest pain 01/29/2013  . Carotid stenosis   . Loss of coordination 12/03/2012  . Obesity 08/20/2011  . Coronary artery disease   . Dyslipidemia   . Hypertension     WEAVER,CHRISTINA, PT 12/08/2015, 9:35 AM  [place] [address] [place], [place], [address] Phone: [phone]   Fax:  [phone]  Name: [name] MRN: [id number] Date of Birth: Aug 20, 1933

## 2015-12-09 DIAGNOSIS — R972 Elevated prostate specific antigen [PSA]: Secondary | ICD-10-CM | POA: Diagnosis not present

## 2015-12-09 DIAGNOSIS — R351 Nocturia: Secondary | ICD-10-CM | POA: Diagnosis not present

## 2015-12-09 DIAGNOSIS — N401 Enlarged prostate with lower urinary tract symptoms: Secondary | ICD-10-CM | POA: Diagnosis not present

## 2015-12-12 ENCOUNTER — Ambulatory Visit (HOSPITAL_COMMUNITY)
Admission: RE | Admit: 2015-12-12 | Discharge: 2015-12-12 | Disposition: A | Discharge status: Home / Self Care | Payer: Medicare Other | Source: Ambulatory Visit | Attending: Cardiology | Admitting: Cardiology

## 2015-12-12 DIAGNOSIS — I6523 Occlusion and stenosis of bilateral carotid arteries: Secondary | ICD-10-CM | POA: Insufficient documentation

## 2015-12-15 ENCOUNTER — Encounter: Payer: Self-pay | Admitting: Family Medicine

## 2015-12-15 ENCOUNTER — Ambulatory Visit (INDEPENDENT_AMBULATORY_CARE_PROVIDER_SITE_OTHER): Payer: Medicare Other | Admitting: Family Medicine

## 2015-12-15 VITALS — BP 170/76 | HR 80 | Temp 97.7°F | Resp 18 | Ht 65.0 in | Wt 219.0 lb

## 2015-12-15 DIAGNOSIS — I6523 Occlusion and stenosis of bilateral carotid arteries: Secondary | ICD-10-CM

## 2015-12-15 DIAGNOSIS — G43A0 Cyclical vomiting, not intractable: Secondary | ICD-10-CM

## 2015-12-15 DIAGNOSIS — R1115 Cyclical vomiting syndrome unrelated to migraine: Secondary | ICD-10-CM

## 2015-12-15 LAB — COMPLETE METABOLIC PANEL WITH GFR
ALT: 16 U/L (ref 9–46)
AST: 18 U/L (ref 10–35)
Albumin: 4.2 g/dL (ref 3.6–5.1)
Alkaline Phosphatase: 73 U/L (ref 40–115)
BUN: 14 mg/dL (ref 7–25)
CO2: 29 mmol/L (ref 20–31)
Calcium: 9.4 mg/dL (ref 8.6–10.3)
Chloride: 101 mmol/L (ref 98–110)
Creat: 0.79 mg/dL (ref 0.70–1.11)
GFR, Est African American: >89 mL/min (ref >=60)
GFR, Est Non African American: 85 mL/min (ref >=60)
Glucose, Bld: 141 mg/dL — ABNORMAL HIGH (ref 70–99)
Potassium: 4.5 mmol/L (ref 3.5–5.3)
Sodium: 139 mmol/L (ref 135–146)
Total Bilirubin: 0.5 mg/dL (ref 0.2–1.2)
Total Protein: 6.2 g/dL (ref 6.1–8.1)

## 2015-12-15 LAB — LIPASE: Lipase: 22 U/L (ref 7–60)

## 2015-12-15 NOTE — Progress Notes (Signed)
Subjective:    Patient ID: [name], male    DOB: 1934-02-20, 82 y.o.   MRN: [id number]  HPI 10/06/15 Please see my last office visit. Since that time, the patient has gained 3 pounds. He does have trace to +1 bipedal edema to the mid shin. He is concerned that he is retaining fluid. On examination today, he does have by basilar Rales. He denies any chest pain, shortness of breath, dyspnea on exertion, orthopnea. However on his examination today, his heart rate is irregularly irregular. It is difficult to tell if these are frequent PVCs versus true atrial fibrillation. Therefore I would like to obtain an EKG. Last echocardiogram of the heart was in 2014 and revealed an ejection fraction of 50-55%.  At that time, my plan was: EKG confirms an irregularly irregular rhythm. There are definite P waves. However pattern appear to be atrial flutter versus normal sinus rhythm with frequent PVCs. I believe this atrial flutter. Furthermore there are new ST changes in lead 1 that was not present compared to an EKG in May of this year. This is a significant change from his EKG in May. I believe that the patient has developed atrial fibrillation versus atrial flutter and has developed pulmonary edema and fluid retention secondary to this. Begin Lasix 40 mg by mouth daily for fluid retention. Patient is already on Plavix given his history of stroke and ASCVD. Given the fact it appears that he is in atrial flutter particularly lead 2, I do not believe he would benefit from taking Coumadin in addition to the Plavix versus the benefit he would get if he were in atrial fibrillation. Furthermore his heart rate is controlled at the present time. Therefore, only start lasix andconsult his cardiologist and obtain an echocardiogram of the heart to evaluate for a depressed ejection fraction. Recheck in one week after gentle diuresis. Repeat EKG at that time as well.  10/13/15 Patient is not feeling any better.  Became  nauseated yesterday. However he has lost 5 pounds since his last office visit. His lungs are clear. The edema in his legs is gone. However he has an irregularly irregular heart rate today. It sounds different from a flutter. I would like to repeat an EKG to verify the rhythm. He is taking Lasix and hydrochlorothiazide. I have recommended that he stop hydrochlorothiazide.  At that time, my plan was: EKG today confirms atrial flutter. Therefore we will continue Plavix. Patient is scheduled to see his cardiologist in August. Heart rate is well controlled. He is complaining of some nausea. I believe it may be due to the fact I have dehydrated him as he has lost 5 pounds in less than a week.  Cut Lasix to 20 mg a day and discontinue hydrochlorothiazide. Recheck in one week or sooner if worse  10/16/15 Patient states that he is feeling worse. He is now having intractable nausea. The nausea is constant but within 1 hour of eating he is vomiting. He has vomited 3 or 4 times in the last few days. He states that he has not vomited in 20 years prior to this. He denies any constipation. In fact he is having diarrhea which is chronic over the last 3 years. He denies any abdominal pain. He denies any fevers or chills. He denies any blood in his stools or black tarry stools. On examination today the abdomen is soft nontender nondistended but very hypoactive bowel sounds. He also has some faint right basilar crackles. He continues  to remain an irregularly irregular rhythm. There is no pedal edema or evidence of fluid overload.  At that time, my plan was: Patient fell recently. He denies any dizziness or syncope. His feet became tangled up by walking and he fell. However he also is having constant nausea which seems to be getting worse. I will check a CBC, CMP, lipase, and a TSH.  I will also check a chest x-ray as well as a two-view of the abdomen. Differential diagnosis includes ileus, diabetic gastroparesis, anxiety, or  intra-abdominal process. His CBC is normal, lipase is normal, liver function tests are normal, and the abdomen x-ray reveals no ileus or obstruction, I will treat the patient empirically with Reglan 10 mg 3 times a day and recheck next week. 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 He states that he feels better. His weight is up to 221 pounds. He is eating better with less nausea. He reports just a little bit of nausea but no or near as severe as last week. His lab work was significant for hyponatremia and we have recommended fluid restriction to less than 1200 mL per day to manage this. His lab work was also significant for an elevated lipase of greater than 300. Therefore I obtained a CT scan of the abdomen and pelvis which revealed no evidence of pancreatitis or pancreatic cancer. Patient is still taking Reglan 3 times a day seems to help substantially with the nausea along with fluid restriction.  AT that time, my plan was:I believe the patient's nausea could be due to a combination of hyponatremia along with possible gastroparesis. He seems to responded to fluid restriction coupled with Reglan 3 times a day. I will repeat a CMP to monitor sodium level. Once his sodium level is normal, I will have the patient discontinue Reglan. If nausea returns, I would recommend a GI consultation to evaluate for gastroparesis. I will repeat a lipase today. I believe his elevated lipase is likely due to vomiting. He is scheduled to see a cardiologist in August guarding his atrial flutter  10/1015 Repeat sodium level was 128. He is here today to follow-up. Since I last saw him he is seen his cardiologist who recommended starting him on anticoagulation. Therefore they discontinued his Plavix and put him on Xarelto.  He is doing much better today. Nausea and vomiting have subsided. His  blood pressure is better. On his examination today he is actually in sinus rhythm now. He has spontaneously converted. He no longer feels weak or tired or nauseated.  12/15/15 The patient has felt fine ever since that visit. He still has diarrhea but this is a chronic problem for him. This morning he awoke. He felt very lightheaded. The room was spinning. He became extremely nauseated and threw up on 3 separate occasions. He is here today for evaluation. He denies any chest pain. He denies any palpitations. He denies any syncope. He denies any fevers or chills. His abdomen is soft, nondistended, normal bowel sounds. BUN is having positional vertigo. He has a history of Mnire's disease with severe vertigo in the past. Past Medical History:  Diagnosis Date  . Atrial flutter ([place])   . BPH (benign prostatic hyperplasia)   . Coronary artery disease    a. s/p MI tx with Cypher DES to pRCA in 4/04;  b. Echocardiogram 7/09: Normal  LV function.  c. Nuclear study 3/13 no ischemia;  d. ETT 2/14 neg;  e. admx with CP => LHC (01/30/2013):  pLAD 40-50%, oD1 70-80%, oOM1 40%, pRCA stent patent, mid RCA 30%, EF 60-65%. => med Rx.  . DDD (degenerative disc disease)   . Diabetes mellitus without complication ([place])   . Dyslipidemia   . Hemorrhoids   . Hx MRSA infection    left buttocks abscess  . Hx of echocardiogram    a. Echocardiogram (01/31/2013): Mild focal basal and mild concentric hypertrophy of the septum, EF 50-55%, normal wall motion, grade 1 diastolic dysfunction, trivial AI, MAC, mild LAE, PASP 35  . Hyperlipidemia   . Hypertension   . Hypothyroidism   . Meniere's disease    Status post shunt  . Myocardial infarction ([place]) 2008  . Occlusion and stenosis of carotid artery without mention of cerebral infarction    40-59% on carotid doppler 2014; [place] (01/2013): R 1-39%, L 60-79%  . Rotator cuff injury    chronic rotator cuff injury status post repair  . Seizure disorder ([place])   . Stroke [facility])     a. 01/2013=> post cardiac cath CVA to L post communicating artery system; R sided weakness  . Syncope    Past Surgical History:  Procedure Laterality Date  . COLONOSCOPY    . CORONARY ANGIOPLASTY WITH STENT PLACEMENT  07/14/2002   Stent to the right coronary artery  . ENDOLYMPHATIC SHUNT DECOMPRESSION  06/11/2009    Right endolymphatic sac decompression and shunt  placement  . HERNIA REPAIR  04/10/2008   scrotal hernia repair  . LEFT HEART CATHETERIZATION WITH CORONARY ANGIOGRAM N/A 01/30/2013   Procedure: LEFT HEART CATHETERIZATION WITH CORONARY ANGIOGRAM;  Surgeon: Larey Dresser, MD;  Location: [facility] CATH LAB;  Service: Cardiovascular;  Laterality: N/A;  . ROTATOR CUFF REPAIR     for chronic rotator cuff injury   Current Outpatient Prescriptions on File Prior to Visit  Medication Sig Dispense Refill  . atorvastatin (LIPITOR) 80 MG tablet TAKE 1 TABLET BY MOUTH EVERY DAY 90 tablet 3  . carbamazepine (CARBATROL) 300 MG 12 hr capsule Take 1 capsule (300 mg total) by mouth 2 (two) times daily. 180 capsule 3  . cephALEXin (KEFLEX) 250 MG capsule Take 250 mg by mouth daily.    . cloNIDine (CATAPRES) 0.1 MG tablet TAKE 1 TABLET TWICE A DAY 60 tablet 6  . diazepam (VALIUM) 2 MG tablet TAKE 1 TABLET BY MOUTH TWICE DAILY 60 tablet 2  . isosorbide mononitrate (IMDUR) 30 MG 24 hr tablet Take 1 tablet by mouth daily.    Marland Kitchen levothyroxine (SYNTHROID, LEVOTHROID) 25 MCG tablet Take 25 mcg by mouth daily before breakfast.    . levothyroxine (SYNTHROID, LEVOTHROID) 75 MCG tablet Take 1 tablet (75 mcg total) by mouth daily. 30 tablet 3  . losartan (COZAAR) 100 MG tablet TAKE 1/2 TABLET BY MOUTH 2 TIMES A DAY 30 tablet 1  . metFORMIN (GLUCOPHAGE) 500 MG tablet TAKE 1 TABLET TWICE A DAY WITH A MEAL 180 tablet 3  . metoCLOPramide (REGLAN) 10 MG tablet Take 1 tablet by mouth 3 (three) times daily.    . metoprolol (LOPRESSOR) 50 MG tablet TAKE 1 TABLET BY MOUTH TWICE A DAY 60 tablet 11  . Multiple  Vitamins-Minerals (MULTIVITAMINS THER. W/MINERALS) TABS Take 1 tablet by mouth at bedtime.      . pantoprazole (PROTONIX) 40 MG tablet Take 1 tablet by mouth daily.    . rivaroxaban (XARELTO) 20 MG TABS  tablet Take 1 tablet (20 mg total) by mouth daily with supper. 30 tablet 11  . silodosin (RAPAFLO) 8 MG CAPS capsule Take 1 capsule (8 mg total) by mouth daily with breakfast. 30 capsule 5   No current facility-administered medications on file prior to visit.    No Known Allergies Social History   Social History  . Marital status: Married    Spouse name: N/A  . Number of children: 4  . Years of education: 12th   Occupational History  . Retired    Social History Main Topics  . Smoking status: Former Smoker    Quit date: 08/19/1961  . Smokeless tobacco: Former Systems developer    Types: Chew  . Alcohol use No  . Drug use: No  . Sexual activity: No   Other Topics Concern  . Not on file   Social History Narrative   Lives with wife.    Review of Systems  All other systems reviewed and are negative.      Objective:   Physical Exam  Constitutional: He appears well-developed and well-nourished.  Cardiovascular: Normal rate, regular rhythm and normal heart sounds.   No murmur heard. Pulmonary/Chest: Effort normal and breath sounds normal. No respiratory distress. He has no wheezes. He has no rales. He exhibits no tenderness.  Abdominal: Soft. Bowel sounds are normal. He exhibits no distension. There is no tenderness. There is no rebound and no guarding.  Vitals reviewed.         Assessment & Plan:  Non-intractable cyclical vomiting with nausea - Plan: CBC with Differential/Platelet, COMPLETE METABOLIC PANEL WITH GFR, Lipase  I believe this episode of vomiting is different than the one he experienced earlier this summer. I do not believe that this is gastroparesis or related to hyponatremia. I believe this is due to vertigo. Begin meclizine 25 mg every 8 hours. His blood pressure is  high because he is not taking his medicine yet this morning due to the vomiting. Take the meclizine. Once this settles his stomach and the vertigo, he can then take his morning medication. Recheck in 24-48 hours. I will check a lipase as well as a sodium level to make sure that those are not abnormal again. Consider Reglan if vomiting persists

## 2015-12-16 LAB — CBC WITH DIFFERENTIAL/PLATELET
Basophils Absolute: 107 cells/uL (ref 0–200)
Basophils Relative: 1 %
Eosinophils Absolute: 321 cells/uL (ref 15–500)
Eosinophils Relative: 3 %
HCT: 39.3 % (ref 38.5–50.0)
Hemoglobin: 13.3 g/dL (ref 13.0–17.0)
Lymphocytes Relative: 18 %
Lymphs Abs: 1926 cells/uL (ref 850–3900)
MCH: 30.2 pg (ref 27.0–33.0)
MCHC: 33.8 g/dL (ref 32.0–36.0)
MCV: 89.1 fL (ref 80.0–100.0)
MPV: 10.4 fL (ref 7.5–12.5)
Monocytes Absolute: 963 cells/uL — ABNORMAL HIGH (ref 200–950)
Monocytes Relative: 9 %
Neutro Abs: 7383 cells/uL (ref 1500–7800)
Neutrophils Relative %: 69 %
Platelets: 290 10*3/uL (ref 140–400)
RBC: 4.41 MIL/uL (ref 4.20–5.80)
RDW: 14.6 % (ref 11.0–15.0)
WBC: 10.7 10*3/uL (ref 3.8–10.8)

## 2015-12-17 ENCOUNTER — Telehealth: Payer: Self-pay | Admitting: *Deleted

## 2015-12-17 DIAGNOSIS — I779 Disorder of arteries and arterioles, unspecified: Secondary | ICD-10-CM

## 2015-12-17 DIAGNOSIS — I739 Peripheral vascular disease, unspecified: Principal | ICD-10-CM

## 2015-12-17 NOTE — Telephone Encounter (Signed)
Ordered placed for 1 year

## 2015-12-17 NOTE — Telephone Encounter (Signed)
-----   Message from Minus Breeding, MD sent at 12/13/2015  7:56 PM EDT ----- 1-39% Right ICA stenosis. 40-59% Left ICA stenosis. Follow up in one year.  Call Mr. Rhue with the results and send results to [place], MD

## 2015-12-18 ENCOUNTER — Ambulatory Visit: Payer: Medicare Other | Admitting: Rehabilitative and Restorative Service Providers"

## 2015-12-18 DIAGNOSIS — R2681 Unsteadiness on feet: Secondary | ICD-10-CM

## 2015-12-18 DIAGNOSIS — M6281 Muscle weakness (generalized): Secondary | ICD-10-CM

## 2015-12-18 DIAGNOSIS — R2689 Other abnormalities of gait and mobility: Secondary | ICD-10-CM

## 2015-12-18 NOTE — Therapy (Signed)
[facility] [address] [place], [place], [address] Phone: [phone]   Fax:  [phone]  Patient Details  Name: MALCUM LAHUE MRN: [id number] Date of Birth: [birth year] Referring Provider:  [name], MD  Encounter Date: 12/18/2015  No charge for today's treatment.      Subjective Assessment - 12/18/15 0854    Subjective The patient reports at front desk that he has an upset stomach and is not able to stay for treatment today.       [place], [place] 12/18/2015, 8:54 AM  [facility] [address] [place], [place], [address] Phone: [phone]   Fax:  [phone]

## 2015-12-19 ENCOUNTER — Ambulatory Visit: Payer: Medicare Other | Admitting: Rehabilitative and Restorative Service Providers"

## 2015-12-19 VITALS — BP 190/88

## 2015-12-19 DIAGNOSIS — R2689 Other abnormalities of gait and mobility: Secondary | ICD-10-CM

## 2015-12-19 DIAGNOSIS — M6281 Muscle weakness (generalized): Secondary | ICD-10-CM

## 2015-12-19 DIAGNOSIS — R2681 Unsteadiness on feet: Secondary | ICD-10-CM

## 2015-12-19 NOTE — Therapy (Signed)
[facility] [address] [place], [place], [address] Phone: [phone]   Fax:  [phone]  Physical Therapy Treatment  Patient Details  Name: [name] MRN: [id number] Date of Birth: 1933-08-17 Referring Provider: Jenna Luo, MD  Encounter Date: 12/19/2015      PT End of Session - 12/19/15 0919    Visit Number 2   Number of Visits 10   Date for PT Re-Evaluation 02/03/16   Authorization Type G code every 10th visit   PT Start Time 0850   PT Stop Time 0915   PT Time Calculation (min) 25 min   Activity Tolerance Patient tolerated treatment well   Behavior During Therapy [facility] for tasks assessed/performed      Past Medical History:  Diagnosis Date  . Atrial flutter ([place])   . BPH (benign prostatic hyperplasia)   . Coronary artery disease    a. s/p MI tx with Cypher DES to pRCA in 4/04;  b. Echocardiogram 7/09: Normal LV function.  c. Nuclear study 3/13 no ischemia;  d. ETT 2/14 neg;  e. admx with CP => [facility] (01/30/2013):  pLAD 40-50%, oD1 70-80%, oOM1 40%, pRCA stent patent, mid RCA 30%, EF 60-65%. => med Rx.  . DDD (degenerative disc disease)   . Diabetes mellitus without complication ([place])   . Dyslipidemia   . Hemorrhoids   . Hx MRSA infection    left buttocks abscess  . Hx of echocardiogram    a. Echocardiogram (01/31/2013): Mild focal basal and mild concentric hypertrophy of the septum, EF 50-55%, normal wall motion, grade 1 diastolic dysfunction, trivial AI, MAC, mild LAE, PASP 35  . Hyperlipidemia   . Hypertension   . Hypothyroidism   . Meniere's disease    Status post shunt  . Myocardial infarction ([place]) 2008  . Occlusion and stenosis of carotid artery without mention of cerebral infarction    40-59% on carotid doppler 2014; [place] (01/2013): R 1-39%, L 60-79%  . Rotator cuff injury    chronic rotator cuff injury status post repair  . Seizure disorder ([place])   . Stroke [facility])    a. 01/2013=> post  cardiac cath CVA to L post communicating artery system; R sided weakness  . Syncope     Past Surgical History:  Procedure Laterality Date  . COLONOSCOPY    . CORONARY ANGIOPLASTY WITH STENT PLACEMENT  07/14/2002   Stent to the right coronary artery  . ENDOLYMPHATIC SHUNT DECOMPRESSION  06/11/2009    Right endolymphatic sac decompression and shunt  placement  . HERNIA REPAIR  04/10/2008   scrotal hernia repair  . LEFT HEART CATHETERIZATION WITH CORONARY ANGIOGRAM N/A 01/30/2013   Procedure: LEFT HEART CATHETERIZATION WITH CORONARY ANGIOGRAM;  Surgeon: Larey Dresser, MD;  Location: [facility] CATH LAB;  Service: Cardiovascular;  Laterality: N/A;  . ROTATOR CUFF REPAIR     for chronic rotator cuff injury    Vitals:   12/19/15 0854 12/19/15 0910  BP: (!) 172/95 (!) 190/88        Subjective Assessment - 12/19/15 0853    Subjective The patient reports vertigo occurred on the same day that his BP was elevated this week.  PT checked BP upon entering clinic (see vitals).  The patient took BP meds at 7am and BP assessed at 8:55.    Patient describes "I'm just weak and staggering."  He denies vertigo today.    Patient Stated Goals Being able to walk in the yard without falling.  I've  fallen 2-3 times and each time get hurt worse.   Currently in Pain? No/denies                         [facility] Adult PT Treatment/Exercise - 12/19/15 0905      Ambulation/Gait   Ambulation/Gait Yes   Gait Comments Walking with tactile cues to swing UEs and verbal cues to lengthen stride length.  Gait x 150 feet, 75 feet, working on bilateral heel strike with activities.      Self-Care   Self-Care Other Self-Care Comments   Other Self-Care Comments  Discussed monitoring BP, symptoms in which he should call MD.     Neuro Re-ed    Neuro Re-ed Details  Standing balance activities with reciprocal LE foot taps to 6" step with CGA, sidestepping with CGA R and L x 20 feet each direction.     Exercises    Exercises Other Exercises   Other Exercises  Seated hamstring stretching                PT Education - 12/19/15 [phone]    Education provided Yes   Education Details PT educated/recommended patient monitor BP with home unit and record it over weekend.  Recommended patient call MD if HA, vision changes or worsening weakness.    Person(s) Educated Patient   Methods Explanation;Demonstration;Handout   Comprehension Returned demonstration;Verbalized understanding          PT Short Term Goals - 12/07/15 0928      PT SHORT TERM GOAL #1   Title The patient will be indep with HEP for LE strength, balance, and mobility.   Baseline Target date 01/03/2016   Time 4   Period Weeks     PT SHORT TERM GOAL #2   Title The patient will improve Berg from 34/56 to > or equal to 39/56 to demo dec'ing risk for falls.   Baseline Target date 01/03/2016   Time 4   Period Weeks     PT SHORT TERM GOAL #3   Title The patient will improve gait speed from 2.02 ft/sec to > or equal to 2.4 ft/sec to demo improving functional mobility.   Baseline Target date 01/03/2016   Time 4   Period Weeks     PT SHORT TERM GOAL #4   Title The patient will negotiate 4 steps with reciprocal pattern with bilateral handrails.    Baseline Target date 01/03/2016   Time 4   Period Weeks           PT Long Term Goals - 12/07/15 [id number]      PT LONG TERM GOAL #1   Title The patient will be indep with post d/c progression of HEP.   Baseline Target date 02/03/2016   Time 8   Period Weeks     PT LONG TERM GOAL #2   Title The patient will improve Berg from 34/56 to > or equal to 42/56 to demo dec'ing risk for falls.   Baseline Target date 02/03/2016   Time 8   Period Weeks     PT LONG TERM GOAL #3   Title The patient will improve speed from 2.02 ft/sec to > or equal to 2.62 ft/sec   Baseline Target date 02/03/2016   Time 8   Period Weeks     PT LONG TERM GOAL #4   Title The patient will negotiate  unlevel community surfaces (including grass, inclines, curbs) without a device independently.  Baseline Target date 02/03/2016   Time 8   Period Weeks               Plan - 12/19/15 [id number]    Clinical Impression Statement The patient continues with "heaviness" in his head.  His BP is elevated today and increased with minimal standing activities, therefore, ended session early.  PT to monitor vitals in therapy and modify plan as needed.    PT Next Visit Plan MONITOR VITALS, review HEP, balance training, compliant surfaces, LE strengthening, fall prevention   Consulted and Agree with Plan of Care Patient      Patient will benefit from skilled therapeutic intervention in order to improve the following deficits and impairments:     Visit Diagnosis: Other abnormalities of gait and mobility  Unsteadiness on feet  Muscle weakness (generalized)     Problem List Patient Active Problem List   Diagnosis Date Noted  . Special screening for malignant neoplasms, colon 12/07/2013  . Bowel habit changes 12/07/2013  . Diarrhea 03/09/2013  . Embolic cerebral infarction ([place]) 02/02/2013  . CVA (cerebral infarction) 01/31/2013  . Chest pain 01/29/2013  . Carotid stenosis   . Loss of coordination 12/03/2012  . Obesity 08/20/2011  . Coronary artery disease   . Dyslipidemia   . Hypertension     WEAVER,CHRISTINA, PT 12/19/2015, 9:30 AM  [facility] [address] [place], [place], [address] Phone: [phone]   Fax:  [phone]  Name: WILEY BIBEY MRN: [id number] Date of Birth: 23-Jan-1934

## 2015-12-20 ENCOUNTER — Inpatient Hospital Stay (HOSPITAL_COMMUNITY)
Admission: EM | Admit: 2015-12-20 | Discharge: 2015-12-26 | DRG: 291 | Disposition: A | Discharge status: Home Health | Payer: Medicare Other | Attending: Internal Medicine | Admitting: Internal Medicine

## 2015-12-20 ENCOUNTER — Emergency Department (HOSPITAL_COMMUNITY): Payer: Medicare Other

## 2015-12-20 ENCOUNTER — Inpatient Hospital Stay (HOSPITAL_COMMUNITY): Payer: Medicare Other

## 2015-12-20 ENCOUNTER — Encounter (HOSPITAL_COMMUNITY): Payer: Self-pay

## 2015-12-20 DIAGNOSIS — J9601 Acute respiratory failure with hypoxia: Secondary | ICD-10-CM | POA: Diagnosis present

## 2015-12-20 DIAGNOSIS — Z79899 Other long term (current) drug therapy: Secondary | ICD-10-CM | POA: Diagnosis not present

## 2015-12-20 DIAGNOSIS — G40909 Epilepsy, unspecified, not intractable, without status epilepticus: Secondary | ICD-10-CM | POA: Diagnosis present

## 2015-12-20 DIAGNOSIS — K580 Irritable bowel syndrome with diarrhea: Secondary | ICD-10-CM | POA: Diagnosis present

## 2015-12-20 DIAGNOSIS — I480 Paroxysmal atrial fibrillation: Secondary | ICD-10-CM | POA: Diagnosis not present

## 2015-12-20 DIAGNOSIS — Z23 Encounter for immunization: Secondary | ICD-10-CM | POA: Diagnosis not present

## 2015-12-20 DIAGNOSIS — I25810 Atherosclerosis of coronary artery bypass graft(s) without angina pectoris: Secondary | ICD-10-CM | POA: Diagnosis not present

## 2015-12-20 DIAGNOSIS — I252 Old myocardial infarction: Secondary | ICD-10-CM | POA: Diagnosis not present

## 2015-12-20 DIAGNOSIS — I248 Other forms of acute ischemic heart disease: Secondary | ICD-10-CM | POA: Diagnosis not present

## 2015-12-20 DIAGNOSIS — Z955 Presence of coronary angioplasty implant and graft: Secondary | ICD-10-CM | POA: Diagnosis not present

## 2015-12-20 DIAGNOSIS — R51 Headache: Secondary | ICD-10-CM | POA: Diagnosis not present

## 2015-12-20 DIAGNOSIS — Z87891 Personal history of nicotine dependence: Secondary | ICD-10-CM | POA: Diagnosis not present

## 2015-12-20 DIAGNOSIS — Z7901 Long term (current) use of anticoagulants: Secondary | ICD-10-CM | POA: Diagnosis not present

## 2015-12-20 DIAGNOSIS — Z7984 Long term (current) use of oral hypoglycemic drugs: Secondary | ICD-10-CM | POA: Diagnosis not present

## 2015-12-20 DIAGNOSIS — Z8673 Personal history of transient ischemic attack (TIA), and cerebral infarction without residual deficits: Secondary | ICD-10-CM

## 2015-12-20 DIAGNOSIS — E118 Type 2 diabetes mellitus with unspecified complications: Secondary | ICD-10-CM | POA: Diagnosis not present

## 2015-12-20 DIAGNOSIS — I2510 Atherosclerotic heart disease of native coronary artery without angina pectoris: Secondary | ICD-10-CM | POA: Diagnosis not present

## 2015-12-20 DIAGNOSIS — I4892 Unspecified atrial flutter: Secondary | ICD-10-CM | POA: Diagnosis present

## 2015-12-20 DIAGNOSIS — J811 Chronic pulmonary edema: Secondary | ICD-10-CM

## 2015-12-20 DIAGNOSIS — E039 Hypothyroidism, unspecified: Secondary | ICD-10-CM | POA: Diagnosis not present

## 2015-12-20 DIAGNOSIS — R0602 Shortness of breath: Secondary | ICD-10-CM | POA: Diagnosis not present

## 2015-12-20 DIAGNOSIS — D649 Anemia, unspecified: Secondary | ICD-10-CM | POA: Diagnosis present

## 2015-12-20 DIAGNOSIS — I161 Hypertensive emergency: Secondary | ICD-10-CM

## 2015-12-20 DIAGNOSIS — R079 Chest pain, unspecified: Secondary | ICD-10-CM | POA: Diagnosis not present

## 2015-12-20 DIAGNOSIS — E785 Hyperlipidemia, unspecified: Secondary | ICD-10-CM | POA: Diagnosis present

## 2015-12-20 DIAGNOSIS — I5031 Acute diastolic (congestive) heart failure: Secondary | ICD-10-CM | POA: Diagnosis present

## 2015-12-20 DIAGNOSIS — I110 Hypertensive heart disease with heart failure: Secondary | ICD-10-CM | POA: Diagnosis not present

## 2015-12-20 DIAGNOSIS — I6523 Occlusion and stenosis of bilateral carotid arteries: Secondary | ICD-10-CM | POA: Diagnosis present

## 2015-12-20 DIAGNOSIS — J810 Acute pulmonary edema: Secondary | ICD-10-CM | POA: Diagnosis not present

## 2015-12-20 DIAGNOSIS — R0603 Acute respiratory distress: Secondary | ICD-10-CM

## 2015-12-20 DIAGNOSIS — K219 Gastro-esophageal reflux disease without esophagitis: Secondary | ICD-10-CM | POA: Diagnosis present

## 2015-12-20 DIAGNOSIS — Z8614 Personal history of Methicillin resistant Staphylococcus aureus infection: Secondary | ICD-10-CM | POA: Diagnosis not present

## 2015-12-20 LAB — CBC
HCT: 40.8 % (ref 39.0–52.0)
Hemoglobin: 12.8 g/dL — ABNORMAL LOW (ref 13.0–17.0)
MCH: 29.2 pg (ref 26.0–34.0)
MCHC: 31.4 g/dL (ref 30.0–36.0)
MCV: 92.9 fL (ref 78.0–100.0)
Platelets: 291 10*3/uL (ref 150–400)
RBC: 4.39 MIL/uL (ref 4.22–5.81)
RDW: 14.9 % (ref 11.5–15.5)
WBC: 9.1 10*3/uL (ref 4.0–10.5)

## 2015-12-20 LAB — BASIC METABOLIC PANEL
Anion gap: 11 (ref 5–15)
BUN: 14 mg/dL (ref 6–20)
CO2: 25 mmol/L (ref 22–32)
Calcium: 9.3 mg/dL (ref 8.9–10.3)
Chloride: 103 mmol/L (ref 101–111)
Creatinine, Ser: 1.02 mg/dL (ref 0.61–1.24)
GFR calc Af Amer: >60 mL/min (ref >60)
GFR calc non Af Amer: >60 mL/min (ref >60)
Glucose, Bld: 133 mg/dL — ABNORMAL HIGH (ref 65–99)
Potassium: 3.6 mmol/L (ref 3.5–5.1)
Sodium: 139 mmol/L (ref 135–145)

## 2015-12-20 LAB — I-STAT TROPONIN, ED: Troponin i, poc: 0.02 ng/mL (ref 0.00–0.08)

## 2015-12-20 LAB — PROTIME-INR
INR: 0.93
Prothrombin Time: 12.5 seconds (ref 11.4–15.2)

## 2015-12-20 MED ORDER — SODIUM CHLORIDE 0.9 % IV SOLN: 250.0000 mL | INTRAVENOUS | Status: DC | PRN

## 2015-12-20 MED ORDER — ASPIRIN 81 MG PO CHEW
324.0000 mg | CHEWABLE_TABLET | Freq: Once | ORAL | Status: AC
  Administered 2015-12-20: 324 mg via ORAL
  Filled 2015-12-20: qty 4

## 2015-12-20 MED ORDER — LOSARTAN POTASSIUM 50 MG PO TABS
50.0000 mg | ORAL_TABLET | Freq: Two times a day (BID) | ORAL | Status: DC
  Administered 2015-12-20 – 2015-12-26 (×12): 50 mg via ORAL
  Filled 2015-12-20 (×12): qty 1

## 2015-12-20 MED ORDER — LEVOTHYROXINE SODIUM 75 MCG PO TABS
75.0000 ug | ORAL_TABLET | Freq: Every day | ORAL | Status: DC
  Administered 2015-12-21 – 2015-12-26 (×6): 75 ug via ORAL
  Filled 2015-12-20 (×6): qty 1

## 2015-12-20 MED ORDER — NITROGLYCERIN IN D5W 200-5 MCG/ML-% IV SOLN
INTRAVENOUS | Status: AC
  Filled 2015-12-20: qty 250

## 2015-12-20 MED ORDER — CARBAMAZEPINE 100 MG PO CHEW
300.0000 mg | CHEWABLE_TABLET | Freq: Two times a day (BID) | ORAL | Status: DC
  Administered 2015-12-21 – 2015-12-26 (×11): 300 mg via ORAL
  Filled 2015-12-20 (×13): qty 3

## 2015-12-20 MED ORDER — NITROGLYCERIN IN D5W 200-5 MCG/ML-% IV SOLN
0.0000 ug/min | INTRAVENOUS | Status: DC
  Administered 2015-12-20: 5 ug/min via INTRAVENOUS

## 2015-12-20 MED ORDER — ATORVASTATIN CALCIUM 80 MG PO TABS
80.0000 mg | ORAL_TABLET | Freq: Every day | ORAL | Status: DC
  Administered 2015-12-21 – 2015-12-26 (×6): 80 mg via ORAL
  Filled 2015-12-20 (×6): qty 1

## 2015-12-20 MED ORDER — METOPROLOL TARTRATE 50 MG PO TABS
50.0000 mg | ORAL_TABLET | Freq: Two times a day (BID) | ORAL | Status: DC
  Administered 2015-12-20 – 2015-12-22 (×5): 50 mg via ORAL
  Filled 2015-12-20 (×5): qty 2

## 2015-12-20 MED ORDER — ISOSORBIDE MONONITRATE ER 30 MG PO TB24
30.0000 mg | ORAL_TABLET | Freq: Every day | ORAL | Status: DC
  Administered 2015-12-21 – 2015-12-26 (×6): 30 mg via ORAL
  Filled 2015-12-20 (×6): qty 1

## 2015-12-20 MED ORDER — PANTOPRAZOLE SODIUM 40 MG PO TBEC
40.0000 mg | DELAYED_RELEASE_TABLET | Freq: Every day | ORAL | Status: DC
  Administered 2015-12-21 – 2015-12-26 (×6): 40 mg via ORAL
  Filled 2015-12-20 (×6): qty 1

## 2015-12-20 MED ORDER — ONDANSETRON HCL 4 MG/2ML IJ SOLN
4.0000 mg | Freq: Once | INTRAMUSCULAR | Status: AC
  Administered 2015-12-20: 4 mg via INTRAVENOUS
  Filled 2015-12-20: qty 2

## 2015-12-20 MED ORDER — RIVAROXABAN 20 MG PO TABS: 20.0000 mg | ORAL_TABLET | Freq: Every day | ORAL | Status: DC

## 2015-12-20 MED ORDER — SODIUM CHLORIDE 0.9 % IV BOLUS (SEPSIS): 1000.0000 mL | Freq: Once | INTRAVENOUS | Status: DC

## 2015-12-20 MED ORDER — LEVOTHYROXINE SODIUM 25 MCG PO TABS: 25.0000 ug | ORAL_TABLET | Freq: Every day | ORAL | Status: DC

## 2015-12-20 MED ORDER — CLONIDINE HCL 0.1 MG PO TABS
0.1000 mg | ORAL_TABLET | Freq: Two times a day (BID) | ORAL | Status: DC
  Administered 2015-12-20 – 2015-12-26 (×12): 0.1 mg via ORAL
  Filled 2015-12-20 (×12): qty 1

## 2015-12-20 MED ORDER — FUROSEMIDE 10 MG/ML IJ SOLN
40.0000 mg | Freq: Once | INTRAMUSCULAR | Status: AC
  Administered 2015-12-20: 40 mg via INTRAVENOUS
  Filled 2015-12-20: qty 4

## 2015-12-20 MED ORDER — NITROGLYCERIN IN D5W 200-5 MCG/ML-% IV SOLN: 0.0000 ug/min | INTRAVENOUS | Status: DC

## 2015-12-20 NOTE — ED Notes (Addendum)
Patient remains on Bi-PAP at this time.  No signs of respiratory distress noted at this time.  Nitro drip is at 45 mcg/min at this time with BP around the [id number] systolic. Left side of lungs are improving with only faint crackles noted.  Right side remains the same with congestion noted throughout.  Will continue to monitor closely.

## 2015-12-20 NOTE — ED Notes (Signed)
Intensivist at bedside.

## 2015-12-20 NOTE — Progress Notes (Signed)
RT transported patient to CT and then to [facility] from the ED without any complications.

## 2015-12-20 NOTE — ED Provider Notes (Signed)
[facility] DEPT Provider Note   CSN: [id number] Arrival date & time: 12/20/15  [id number]   History   Chief Complaint Chief Complaint  Patient presents with  . Chest Pain    HPI ACEL EASTER is a 82 y.o. male With a past medical history significant for her disease status post stent and RCA, stroke, diabetes, hypertension, hyperlipidemia, and cranial shunt who presents with chest pain and elevated blood pressures. Patient reports that last week, he has had no blood pressure. He reported associated episodes of lightheadedness, nausea, and vomiting. He reports that he is also had a chronic cough. Patient says that around one hour ago, he had onset of left-sided chest pressure. Patient described the pain is severe and crushing. He said he is unsure if it feels like prior to my. He reports associated worsening shortness of breath lightheadedness. He does report palpitations and diaphoresis. He reports that he is on Xarelto.  Patient denies any fevers, chills, constipation, diarrhea, or dysuria. He reports that his blood pressure was 190 at home prompting him to seek evaluation.      The history is provided by the patient, a relative and medical records. No language interpreter was used.  Chest Pain   This is a new problem. The current episode started 1 to 2 hours ago. The problem occurs constantly. The problem has been gradually worsening. The pain is associated with exertion and breathing. The pain is present in the substernal region. The pain is at a severity of 10/10. The pain is severe. The quality of the pain is described as exertional and pleuritic. The pain does not radiate. Duration of episode(s) is 2 hours. The symptoms are aggravated by deep breathing and exertion. Associated symptoms include cough, diaphoresis, dizziness, exertional chest pressure, nausea, palpitations, shortness of breath and vomiting. Pertinent negatives include no abdominal pain, no back pain, no fever, no  hemoptysis, no lower extremity edema and no numbness. He has tried nothing for the symptoms.  His past medical history is significant for CAD, diabetes, hyperlipidemia, hypertension and strokes.    Past Medical History:  Diagnosis Date  . Atrial flutter ([place])   . BPH (benign prostatic hyperplasia)   . Coronary artery disease    a. s/p MI tx with Cypher DES to pRCA in 4/04;  b. Echocardiogram 7/09: Normal LV function.  c. Nuclear study 3/13 no ischemia;  d. ETT 2/14 neg;  e. admx with CP => [facility] (01/30/2013):  pLAD 40-50%, oD1 70-80%, oOM1 40%, pRCA stent patent, mid RCA 30%, EF 60-65%. => med Rx.  . DDD (degenerative disc disease)   . Diabetes mellitus without complication ([place])   . Dyslipidemia   . Hemorrhoids   . Hx MRSA infection    left buttocks abscess  . Hx of echocardiogram    a. Echocardiogram (01/31/2013): Mild focal basal and mild concentric hypertrophy of the septum, EF 50-55%, normal wall motion, grade 1 diastolic dysfunction, trivial AI, MAC, mild LAE, PASP 35  . Hyperlipidemia   . Hypertension   . Hypothyroidism   . Meniere's disease    Status post shunt  . Myocardial infarction ([place]) 2008  . Occlusion and stenosis of carotid artery without mention of cerebral infarction    40-59% on carotid doppler 2014; [place] (01/2013): R 1-39%, L 60-79%  . Rotator cuff injury    chronic rotator cuff injury status post repair  . Seizure disorder ([place])   . Stroke [facility])    a. 01/2013=> post cardiac cath CVA to L  post communicating artery system; R sided weakness  . Syncope     Patient Active Problem List   Diagnosis Date Noted  . Special screening for malignant neoplasms, colon 12/07/2013  . Bowel habit changes 12/07/2013  . Diarrhea 03/09/2013  . Embolic cerebral infarction ([place]) 02/02/2013  . CVA (cerebral infarction) 01/31/2013  . Chest pain 01/29/2013  . Carotid stenosis   . Loss of coordination 12/03/2012  . Obesity 08/20/2011  . Coronary artery disease   . Dyslipidemia     . Hypertension     Past Surgical History:  Procedure Laterality Date  . COLONOSCOPY    . CORONARY ANGIOPLASTY WITH STENT PLACEMENT  07/14/2002   Stent to the right coronary artery  . ENDOLYMPHATIC SHUNT DECOMPRESSION  06/11/2009    Right endolymphatic sac decompression and shunt  placement  . HERNIA REPAIR  04/10/2008   scrotal hernia repair  . LEFT HEART CATHETERIZATION WITH CORONARY ANGIOGRAM N/A 01/30/2013   Procedure: LEFT HEART CATHETERIZATION WITH CORONARY ANGIOGRAM;  Surgeon: Larey Dresser, MD;  Location: [facility] CATH LAB;  Service: Cardiovascular;  Laterality: N/A;  . ROTATOR CUFF REPAIR     for chronic rotator cuff injury       Home Medications    Prior to Admission medications   Medication Sig Start Date End Date Taking? Authorizing Provider  atorvastatin (LIPITOR) 80 MG tablet TAKE 1 TABLET BY MOUTH EVERY DAY 06/23/15   [name], MD  carbamazepine (CARBATROL) 300 MG 12 hr capsule Take 1 capsule (300 mg total) by mouth 2 (two) times daily. 10/09/15   [name], MD  cephALEXin (KEFLEX) 250 MG capsule Take 250 mg by mouth daily.    Historical Provider, MD  cloNIDine (CATAPRES) 0.1 MG tablet TAKE 1 TABLET TWICE A DAY 06/17/15   [name], MD  diazepam (VALIUM) 2 MG tablet TAKE 1 TABLET BY MOUTH TWICE DAILY 10/23/15   [name], MD  isosorbide mononitrate (IMDUR) 30 MG 24 hr tablet Take 1 tablet by mouth daily. 10/20/15   Historical Provider, MD  levothyroxine (SYNTHROID, LEVOTHROID) 25 MCG tablet Take 25 mcg by mouth daily before breakfast.    Historical Provider, MD  levothyroxine (SYNTHROID, LEVOTHROID) 75 MCG tablet Take 1 tablet (75 mcg total) by mouth daily. 10/31/15   [name], MD  losartan (COZAAR) 100 MG tablet TAKE 1/2 TABLET BY MOUTH 2 TIMES A DAY 06/05/15   [name], MD  metFORMIN (GLUCOPHAGE) 500 MG tablet TAKE 1 TABLET TWICE A DAY WITH A MEAL 10/09/15   [name], MD  metoCLOPramide (REGLAN) 10 MG tablet Take 1 tablet by  mouth 3 (three) times daily. 10/20/15   Historical Provider, MD  metoprolol (LOPRESSOR) 50 MG tablet TAKE 1 TABLET BY MOUTH TWICE A DAY 05/05/15   [name], MD  Multiple Vitamins-Minerals (MULTIVITAMINS THER. W/MINERALS) TABS Take 1 tablet by mouth at bedtime.      Historical Provider, MD  pantoprazole (PROTONIX) 40 MG tablet Take 1 tablet by mouth daily. 10/20/15   Historical Provider, MD  rivaroxaban (XARELTO) 20 MG TABS tablet Take 1 tablet (20 mg total) by mouth daily with supper. 10/27/15   Minus Breeding, MD  silodosin (RAPAFLO) 8 MG CAPS capsule Take 1 capsule (8 mg total) by mouth daily with breakfast. 06/14/14   [name], MD    Family History Family History  Problem Relation Age of Onset  . Heart attack Father 37  . Aneurysm Mother  brain aneurysm  . Coronary artery disease Brother     with CABG  . Diabetes Brother   . Colon cancer Neg Hx   . Colon polyps Neg Hx     Social History Social History  Substance Use Topics  . Smoking status: Former Smoker    Quit date: 08/19/1961  . Smokeless tobacco: Former Systems developer    Types: Chew  . Alcohol use No     Allergies   Review of patient's allergies indicates no known allergies.   Review of Systems Review of Systems  Constitutional: Positive for chills, diaphoresis and fatigue. Negative for fever.  HENT: Positive for congestion.   Eyes: Negative for visual disturbance.  Respiratory: Positive for cough, chest tightness and shortness of breath. Negative for hemoptysis and wheezing.   Cardiovascular: Positive for chest pain and palpitations. Negative for leg swelling.  Gastrointestinal: Positive for nausea and vomiting. Negative for abdominal pain, constipation and diarrhea.  Genitourinary: Negative for flank pain and frequency.  Musculoskeletal: Negative for back pain, neck pain and neck stiffness.  Skin: Negative for rash and wound.  Neurological: Positive for dizziness and light-headedness. Negative for syncope  and numbness.  All other systems reviewed and are negative.    Physical Exam Updated Vital Signs BP (!) 209/83 (BP Location: Left Arm)   Pulse 84   Temp 98.2 F (36.8 C) (Oral)   Resp 20   SpO2 96%   Physical Exam  Constitutional: He is oriented to person, place, and time. He appears well-developed and well-nourished. He appears distressed.  HENT:  Head: Normocephalic.  Mouth/Throat: Oropharynx is clear and moist. No oropharyngeal exudate.  Eyes: Conjunctivae and EOM are normal. Pupils are equal, round, and reactive to light.  Neck: Normal range of motion.  Cardiovascular: Regular rhythm and intact distal pulses.  Tachycardia present.   No murmur heard. Pulmonary/Chest: No stridor. Tachypnea noted. He is in respiratory distress. He has rhonchi. He has rales. He exhibits no tenderness.  Breath sounds initially had very mild rhonchi however, on reassessment with worsening respiratory distress, significant rales were appreciated.  Abdominal: Soft. There is no tenderness.  Musculoskeletal: He exhibits no tenderness.  Neurological: He is alert and oriented to person, place, and time. He is not disoriented. No sensory deficit. He exhibits normal muscle tone. Coordination normal.  Skin: Skin is warm. Capillary refill takes less than 2 seconds. He is diaphoretic. No erythema. There is pallor.  Psychiatric: He has a normal mood and affect.  Nursing note and vitals reviewed.    ED Treatments / Results  Labs (all labs ordered are listed, but only abnormal results are displayed) Labs Reviewed  BASIC METABOLIC PANEL - Abnormal; Notable for the following:       Result Value   Glucose, Bld 133 (*)    All other components within normal limits  CBC - Abnormal; Notable for the following:    Hemoglobin 12.8 (*)    All other components within normal limits  MRSA PCR SCREENING  PROTIME-INR  CBC  BASIC METABOLIC PANEL  MAGNESIUM  PHOSPHORUS  I-STAT TROPOININ, ED    EKG  EKG  Interpretation  Date/Time:  Saturday December 20 2015 18:57:03 EDT Ventricular Rate:  83 PR Interval:  202 QRS Duration: 122 QT Interval:  374 QTC Calculation: 439 R Axis:   -6 Text Interpretation:  Normal sinus rhythm Left ventricular hypertrophy with QRS widening ST & T wave abnormality, consider inferior ischemia Abnormal ECG Confirmed by Sherry Ruffing MD, CHRISTOPHER ([id number]) on 12/20/2015 7:51:24 PM  Radiology Ct Head Wo Contrast  Result Date: 12/20/2015 CLINICAL DATA:  82 y/o M; headache, nausea, and vomiting, similar history of right endolymphatic shunt decompression. EXAM: CT HEAD WITHOUT CONTRAST TECHNIQUE: Contiguous axial images were obtained from the base of the skull through the vertex without intravenous contrast. COMPARISON:  07/24/2015 CT head.  01/31/2013 MRI brain. FINDINGS: Brain: No evidence of acute infarction, hemorrhage, hydrocephalus, extra-axial collection or mass lesion/mass effect. Mild parenchymal volume loss and chronic microvascular ischemic changes are stable. Stable lucencies in the left thalamus, left pons and left cerebellar hemisphere consistent with old lacunar infarcts. Vascular: Extensive calcific atherosclerosis of internal carotid arteries and vertebrobasilar system. No hyperdense vessel. Skull: Wall up right mastoidectomy, no soft tissue within the mastoidectomy bowl. The calvarium is otherwise unremarkable. Sinuses/Orbits: No acute finding. Opacification of right greater than left external auditory canals probably represents cerumen. Other: None. IMPRESSION: No acute intracranial abnormality is identified. Stable chronic microvascular ischemic changes, parenchymal volume loss, intracranial calcific atherosclerosis, and postsurgical changes related to right wall up mastoidectomy. Electronically Signed   By: Kristine Garbe M.D.   On: 12/20/2015 22:45   Dg Chest Portable 1 View  Result Date: 12/20/2015 CLINICAL DATA:  Acute onset of generalized  chest pain and shortness of breath. Initial encounter. EXAM: PORTABLE CHEST 1 VIEW COMPARISON:  Chest radiograph from 10/16/2015 FINDINGS: Diffuse right-sided airspace opacity and mild left basilar airspace opacity are seen, concerning for multifocal pneumonia. Asymmetric pulmonary edema could have a similar appearance, though it is considered less likely. No pleural effusion or pneumothorax is seen. The cardiomediastinal silhouette remains normal in size. No acute osseous abnormalities are identified. External pacing pads are seen. IMPRESSION: Diffuse right-sided airspace opacity and mild left basilar airspace opacity, concerning for multifocal pneumonia. Asymmetric pulmonary edema could have a similar appearance, though it is considered less likely. Electronically Signed   By: Garald Balding M.D.   On: 12/20/2015 21:08    Procedures Procedures (including critical care time)  CRITICAL CARE Performed by: Gwenyth Allegra Tegeler Total critical care time: 45 minutes Critical care time was exclusive of separately billable procedures and treating other patients. Critical care was necessary to treat or prevent imminent or life-threatening deterioration. Critical care was time spent personally by me on the following activities: development of treatment plan with patient and/or surrogate as well as nursing, discussions with consultants, evaluation of patient's response to treatment, examination of patient, obtaining history from patient or surrogate, ordering and performing treatments and interventions, ordering and review of laboratory studies, ordering and review of radiographic studies, pulse oximetry and re-evaluation of patient's condition.    Medications Ordered in ED Medications  nitroGLYCERIN 50 mg in dextrose 5 % 250 mL (0.2 mg/mL) infusion (45 mcg/min Intravenous Rate/Dose Change 12/20/15 2149)  0.9 %  sodium chloride infusion (not administered)  atorvastatin (LIPITOR) tablet 80 mg (not  administered)  carbamazepine (TEGRETOL) chewable tablet 300 mg (not administered)  cloNIDine (CATAPRES) tablet 0.1 mg (0.1 mg Oral Given 12/20/15 2324)  isosorbide mononitrate (IMDUR) 24 hr tablet 30 mg (not administered)  levothyroxine (SYNTHROID, LEVOTHROID) tablet 75 mcg (not administered)  losartan (COZAAR) tablet 50 mg (50 mg Oral Given 12/20/15 2324)  metoprolol tartrate (LOPRESSOR) tablet 50 mg (50 mg Oral Given 12/20/15 2324)  pantoprazole (PROTONIX) EC tablet 40 mg (not administered)  rivaroxaban (XARELTO) tablet 20 mg (not administered)  ondansetron (ZOFRAN) injection 4 mg (4 mg Intravenous Given 12/20/15 2101)  aspirin chewable tablet 324 mg (324 mg Oral Given 12/20/15 2321)  furosemide (LASIX) injection  40 mg (40 mg Intravenous Given 12/20/15 2328)     Initial Impression / Assessment and Plan / ED Course  I have reviewed the triage vital signs and the nursing notes.  Pertinent labs & imaging results that were available during my care of the patient were reviewed by me and considered in my medical decision making (see chart for details).  Clinical Course  Value Comment By Time  EKG 12-Lead (Reviewed) Elveria Rising, MD 09/30 2009    Aydeen Strathman Smoots is a 82 y.o. male With a past medical history significant for her disease status post stent and RCA, stroke, diabetes, hypertension, hyperlipidemia, and cranial shunt who presents with chest pain and elevated blood pressures. History and exam are seen above.  Patient arrived with chest pain and shortness of breath. Patients EKG from triage showed inverted T waves in two, three, and AVF. Repeat EKG after patient was brought to room showed up right T waves in these leads however, there was concern for possible developing ST elevation. Cardiology quickly called.  During cardiology conversation, patient developed severe hypoxia with option saturations in the 60s and 70s. Patient quickly placed on a non-rebreather mask. Patient had minimal  improvement of oxygen saturation into the 80s. Lung exam revealed significant Rales which was new from arrival. Suspect developing flash pulmonary edema and hypoxia.  Patient began having worsening respiratory distress. Patient expressed of this examiner that he would desire intubation if required but is willing to try BiPAP. Given concern for pulmonary edema, nitroglycerin drip was started to decrease blood pressure which was found to be [id number] systolic.  Patient had improvement in O2 saturation On BiPAP and with blood pressure improving. Cardiology came to the bedside and felt ST changes in T-wave changes were secondary to hypertensive emergency rather than acute MI. Aspirin was ordered.   Critical care was called and will patient to their service for further management. Given patient's history of shunt and reported nausea, vomiting, hypertension, and lightheadedness, CT that order. Patient not fell appropriate for CT scan while in respiratory distress as he cannot lay flat. We'll deferred this imaging to inpatient management.  Laboratory testing return showing nonelevated initial troponin. INR nonelevated.Laboratory testing otherwise unremarkable with BMP and CBC. Chest x-ray showed concern for pneumonia versus edema.  Given clinical picture, suspect edema as cause of respiratory distress in setting of hypertensive emergency.   Patient and critical care in stable condition.   Final Clinical Impressions(s) / ED Diagnoses   Final diagnoses:  Hypertensive emergency  Respiratory distress    Clinical Impression: 1. Hypertensive emergency   2. Respiratory distress     Disposition: Admit to Critical Care Service    Courtney Paris, MD 12/21/15 [phone]

## 2015-12-20 NOTE — ED Notes (Signed)
Nurse drawing labs. 

## 2015-12-20 NOTE — ED Notes (Signed)
Pt now on bi-pap now and breathing status improving. Dr Sherry Ruffing, RT, Aaron Edelman RN and Lennette Bihari RN in room. Pt alert and oriented x 4.

## 2015-12-20 NOTE — Progress Notes (Signed)
RT placed patient on BIPAP per MD verbal order. Patient placed on 18/8 and 70%. Patient tolerating well at this time.

## 2015-12-20 NOTE — H&P (Signed)
PULMONARY / CRITICAL CARE MEDICINE   Name: [name] MRN: [id number] DOB: August 31, 1933    ADMISSION DATE:  12/20/2015 CONSULTATION DATE:   REFERRING MD:  EDP  CHIEF COMPLAINT:  Shortness of breath  HISTORY OF PRESENT ILLNESS:   [name] is an 83M with PMH signifciant for A flutter, MI and CAD s/p stent, diabetes mellitus type 2, prior CVA without significant residual deficits, hypothyroidism, HTN, HLD, and Meniere's disease. He presents to the ED with complaints of shortness of breath. He was largely in his normal state of health until about 2 hours before presentation when he experienced an episode of urinary incontinence and became very upset and agitated. His breathing then became increasingly labored and his saturations dropped. He reports that his blood pressure has been running high for the past couple of days, but more in the 0000000 systolic range. He has had some chest tightness associated with this. He reports he has been compliant with medications. His legs have felt weak, but this is not significantly different from his baseline - no lower extremity edema. He denies dysuria/frequency/urgency. No recent fever / chills. He has a chronic cough, which has been more pronounced for the past two days, but no sputum production or hemoptysis. He denies any neurologic symptoms - no numbness / weakness / tingling, no visual changes. He has intermittent diarrhea, but his family attributes this to IBS.  In the ED his BP was initially low [id number] systolic. He was put on a nitro gtt, with BP now in the 180s. He was also placed on BiPAP 18/8 @ 70% FiO2, which he seems to be tolerating okay for the moment with improvement in his sats to the mid 90s Labs are largely unremarkable. Cr. essentially at baseline, no elevation of WBC, no left shift, no electrolyte abnormalities.  PAST MEDICAL HISTORY :  He  has a past medical history of Atrial flutter ([place]); BPH (benign prostatic hyperplasia); Coronary artery  disease; DDD (degenerative disc disease); Diabetes mellitus without complication ([place]); Dyslipidemia; Hemorrhoids; MRSA infection; echocardiogram; Hyperlipidemia; Hypertension; Hypothyroidism; Meniere's disease; Myocardial infarction [facility]) (2008); Occlusion and stenosis of carotid artery without mention of cerebral infarction; Rotator cuff injury; Seizure disorder ([place]); Stroke [facility]); and Syncope.  PAST SURGICAL HISTORY: He  has a past surgical history that includes Coronary angioplasty with stent (07/14/2002); Rotator cuff repair; Hernia repair (04/10/2008); Endolymphatic shunt decompression (06/11/2009); Colonoscopy; and left heart catheterization with coronary angiogram (N/A, 01/30/2013).  No Known Allergies  No current facility-administered medications on file prior to encounter.    Current Outpatient Prescriptions on File Prior to Encounter  Medication Sig  . atorvastatin (LIPITOR) 80 MG tablet TAKE 1 TABLET BY MOUTH EVERY DAY  . carbamazepine (CARBATROL) 300 MG 12 hr capsule Take 1 capsule (300 mg total) by mouth 2 (two) times daily.  . cephALEXin (KEFLEX) 250 MG capsule Take 250 mg by mouth daily.  . cloNIDine (CATAPRES) 0.1 MG tablet TAKE 1 TABLET TWICE A DAY  . diazepam (VALIUM) 2 MG tablet TAKE 1 TABLET BY MOUTH TWICE DAILY  . isosorbide mononitrate (IMDUR) 30 MG 24 hr tablet Take 1 tablet by mouth daily.  Marland Kitchen levothyroxine (SYNTHROID, LEVOTHROID) 25 MCG tablet Take 25 mcg by mouth daily before breakfast.  . levothyroxine (SYNTHROID, LEVOTHROID) 75 MCG tablet Take 1 tablet (75 mcg total) by mouth daily.  Marland Kitchen losartan (COZAAR) 100 MG tablet TAKE 1/2 TABLET BY MOUTH 2 TIMES A DAY  . metFORMIN (GLUCOPHAGE) 500 MG tablet TAKE 1 TABLET TWICE A DAY WITH  A MEAL  . metoCLOPramide (REGLAN) 10 MG tablet Take 1 tablet by mouth 3 (three) times daily.  . metoprolol (LOPRESSOR) 50 MG tablet TAKE 1 TABLET BY MOUTH TWICE A DAY  . Multiple Vitamins-Minerals (MULTIVITAMINS THER. W/MINERALS) TABS Take 1  tablet by mouth at bedtime.    . pantoprazole (PROTONIX) 40 MG tablet Take 1 tablet by mouth daily.  . rivaroxaban (XARELTO) 20 MG TABS tablet Take 1 tablet (20 mg total) by mouth daily with supper.  . silodosin (RAPAFLO) 8 MG CAPS capsule Take 1 capsule (8 mg total) by mouth daily with breakfast.    FAMILY HISTORY:  His indicated that his mother is deceased. He indicated that his father is deceased. He indicated that the status of his neg hx is unknown.    SOCIAL HISTORY: He  reports that he quit smoking about 54 years ago. He has quit using smokeless tobacco. His smokeless tobacco use included Chew. He reports that he does not drink alcohol or use drugs.  REVIEW OF SYSTEMS:   General: no weight change, no fever, no chills Cardiovascular: see HPI Respiratory: see HPI Gastrointestinal: see HPI Genitourinary: see HPI Musculoskeletal: his legs are often sore and weak; no other joint pain or swelling Skin: fatty tumor R forearm, no rashes, sores, or ulcers, Endocrine: DMII, hypothyroidism Neurologic: no numbness, tingling, dizziness, or visual changes Hematologic: on Xarelto, but no bleeding, bruising, or clotting problems Psychiatric: no depression or anxiety, no suicidal ideation or intent  SUBJECTIVE:    VITAL SIGNS: BP (!) 179/116   Pulse 103   Temp 98.2 F (36.8 C) (Oral)   Resp 23   SpO2 98%   HEMODYNAMICS:    VENTILATOR SETTINGS:    INTAKE / OUTPUT: No intake/output data recorded.  PHYSICAL EXAMINATION:  General Well nourished, well developed, moderate distress, sitting up in bed on BiPAP  HEENT No gross abnormalities. BiPAP in place.   Pulmonary Coarse breath sounds bilaterally with significant transmitted upper airway sounds. Rales audible at bilateral bases. Good effort, symmetrical expansion, equal resonance and fremitus.   Cardiovascular Normal rate, regular rhythm. S1, s2. No m/r/g. Distal pulses palpable.  Abdomen Soft, non-tender, non-distended,  positive bowel sounds, no palpable organomegaly or masses. Obese. Normoresonant to percussion.  Musculoskeletal Grossly normal.  Lymphatics No cervical, supraclavicular or axillary adenopathy.   Neurologic Grossly intact. No focal deficits.    Skin/Integuement No rash, no cyanosis, no clubbing. No edema. Lipoma right forearm.    LABS:  BMET  Recent Labs Lab 12/15/15 1023 12/20/15 1915  NA 139 139  K 4.5 3.6  CL 101 103  CO2 29 25  BUN 14 14  CREATININE 0.79 1.02  GLUCOSE 141* 133*    Electrolytes  Recent Labs Lab 12/15/15 1023 12/20/15 1915  CALCIUM 9.4 9.3    CBC  Recent Labs Lab 12/15/15 1023 12/20/15 1915  WBC 10.7 9.1  HGB 13.3 12.8*  HCT 39.3 40.8  PLT 290 291    Coag's  Recent Labs Lab 12/20/15 1915  INR 0.93    Sepsis Markers No results for input(s): LATICACIDVEN, PROCALCITON, O2SATVEN in the last 168 hours.  ABG No results for input(s): PHART, PCO2ART, PO2ART in the last 168 hours.  Liver Enzymes  Recent Labs Lab 12/15/15 1023  AST 18  ALT 16  ALKPHOS 73  BILITOT 0.5  ALBUMIN 4.2    Cardiac Enzymes No results for input(s): TROPONINI, PROBNP in the last 168 hours.  Glucose No results for input(s): GLUCAP in the last 168  hours.  Imaging Dg Chest Portable 1 View  Result Date: 12/20/2015 CLINICAL DATA:  Acute onset of generalized chest pain and shortness of breath. Initial encounter. EXAM: PORTABLE CHEST 1 VIEW COMPARISON:  Chest radiograph from 10/16/2015 FINDINGS: Diffuse right-sided airspace opacity and mild left basilar airspace opacity are seen, concerning for multifocal pneumonia. Asymmetric pulmonary edema could have a similar appearance, though it is considered less likely. No pleural effusion or pneumothorax is seen. The cardiomediastinal silhouette remains normal in size. No acute osseous abnormalities are identified. External pacing pads are seen. IMPRESSION: Diffuse right-sided airspace opacity and mild left basilar  airspace opacity, concerning for multifocal pneumonia. Asymmetric pulmonary edema could have a similar appearance, though it is considered less likely. Electronically Signed   By: Garald Balding M.D.   On: 12/20/2015 21:08    STUDIES:    CULTURES: None  ANTIBIOTICS: None  SIGNIFICANT EVENTS:   LINES/TUBES: PIV  DISCUSSION: Mr. Kassin is an 50M with multiple medical problems including CAD s/p stent, a flutter, diastolic dysfunction, diabetes mellitus type 2, prior CVA without significant residual deficits, hypothyroidism, HTN, HLD, and Meniere's disease, who presents with hypertensive emergency with pulmonary edema, increased work of breathing and shortness of breath. He is requiring a good bit of support with BiPAP (18/8, 70% FiO2), and is requiring a nitro gtt for blood pressure control.   ASSESSMENT / PLAN:  PULMONARY A: Acute hypoxemic respiratory failure 2/2 flash pulmonary edema / hypertensive emergency P:   Continue BiPAP for now Lasix 40mg  iv x 1 Wean settings as able Patient would desire intubation if necessary  CARDIOVASCULAR A:  Hypertensive emergency associated with flash pulmonary edema and strain pattern on EKG P:  Continue nitro gtt for now; consider cardene if BP elevations persist Target ~20% BP reduction given hx of prior stroke Resume home BP meds, titrate as needed Cardiology was called by the ED and feels ECG changes are 2/2 hypertensive emergency  RENAL A:   No acute issues P:   Strict I/Os Monitor renal function Monitor lytes and replace as needed  GASTROINTESTINAL A:   Intermittent nausea and diarrhea P:   PRN bowel regimen  HEMATOLOGIC A:   Chronic normocytic anemia P:  No intervention Monitor  INFECTIOUS A:   No acute issues P:   No abx at this time  ENDOCRINE A:   Hx DMII Hx hypothyroidism   P:   Continue home synthroid CBG + SSI while inpt  NEUROLOGIC A:   Hx CVA Hx seizures P:   RASS goal: 0 Continue  carbamazapine Continue plavix   FAMILY  - Updates: Patient and family. He desires to be FULL CODE.   - Inter-disciplinary family meet or Palliative Care meeting due by:  day 7  The patient is critically ill with multiple organ system failure and requires high complexity decision making for assessment and support, frequent evaluation and titration of therapies, advanced monitoring, review of radiographic studies and interpretation of complex data.   Critical Care Time devoted to patient care services, exclusive of separately billable procedures, described in this note is 48 minutes.   Yisroel Ramming, MD Pulmonary and [place] Pager: [phone]  12/20/2015, 9:39 PM

## 2015-12-20 NOTE — ED Notes (Signed)
At 2000 patient began to have difficulties breathing with an oxygen saturation that dropped to 69% on room air.  Before this time lung sounds were clear and saturations were 95-100%.  After the drop in oxygen saturation patient's lungs have coarse crackles bilaterally.  1 inch of nitro paste was applied to the patients left chest per order of Vanita Panda MD and Tegler MD who were both present at that time.  Patient was placed on Bi-PAP by respiratory and set up in the bed.  Patient consciously stated he was "getting tired of breathing".  Intensivist was called to see patient.  Nitro drip was started and titrated per order set.  BP has begun to climb down under [id number] systolic.  Will continue to monitor patient closely.

## 2015-12-20 NOTE — ED Notes (Signed)
MD at bedside. 

## 2015-12-20 NOTE — ED Triage Notes (Signed)
Pt reports he went to PCP last week and his BP was elevated, was directed to keep check on BP.  BP has been high.  Pt was worried today because of BP, this afternoon pt having intermittant chest tightness.  Pt has been under a lot of stress.

## 2015-12-21 ENCOUNTER — Encounter (HOSPITAL_COMMUNITY): Payer: Self-pay | Admitting: *Deleted

## 2015-12-21 DIAGNOSIS — I161 Hypertensive emergency: Secondary | ICD-10-CM

## 2015-12-21 DIAGNOSIS — J9601 Acute respiratory failure with hypoxia: Secondary | ICD-10-CM

## 2015-12-21 LAB — CBC
HCT: 43.9 % (ref 39.0–52.0)
Hemoglobin: 14.2 g/dL (ref 13.0–17.0)
MCH: 29.8 pg (ref 26.0–34.0)
MCHC: 32.3 g/dL (ref 30.0–36.0)
MCV: 92 fL (ref 78.0–100.0)
Platelets: 291 10*3/uL (ref 150–400)
RBC: 4.77 MIL/uL (ref 4.22–5.81)
RDW: 14.9 % (ref 11.5–15.5)
WBC: 20.8 10*3/uL — ABNORMAL HIGH (ref 4.0–10.5)

## 2015-12-21 LAB — BASIC METABOLIC PANEL
Anion gap: 11 (ref 5–15)
BUN: 17 mg/dL (ref 6–20)
CO2: 27 mmol/L (ref 22–32)
Calcium: 9.1 mg/dL (ref 8.9–10.3)
Chloride: 102 mmol/L (ref 101–111)
Creatinine, Ser: 1.09 mg/dL (ref 0.61–1.24)
GFR calc Af Amer: >60 mL/min (ref >60)
GFR calc non Af Amer: >60 mL/min (ref >60)
Glucose, Bld: 138 mg/dL — ABNORMAL HIGH (ref 65–99)
Potassium: 4 mmol/L (ref 3.5–5.1)
Sodium: 140 mmol/L (ref 135–145)

## 2015-12-21 LAB — TROPONIN I
Troponin I: 0.07 ng/mL (ref <0.03)
Troponin I: 0.07 ng/mL (ref <0.03)
Troponin I: 0.06 ng/mL (ref <0.03)

## 2015-12-21 LAB — PHOSPHORUS: Phosphorus: 2.7 mg/dL (ref 2.5–4.6)

## 2015-12-21 LAB — MAGNESIUM: Magnesium: 1.5 mg/dL — ABNORMAL LOW (ref 1.7–2.4)

## 2015-12-21 LAB — MRSA PCR SCREENING: MRSA by PCR: NEGATIVE

## 2015-12-21 MED ORDER — MAGNESIUM SULFATE 4 GM/100ML IV SOLN
4.0000 g | Freq: Once | INTRAVENOUS | Status: AC
  Administered 2015-12-21: 4 g via INTRAVENOUS
  Filled 2015-12-21: qty 100

## 2015-12-21 MED ORDER — ORAL CARE MOUTH RINSE: 15.0000 mL | Freq: Two times a day (BID) | OROMUCOSAL | Status: DC

## 2015-12-21 MED ORDER — POTASSIUM CHLORIDE CRYS ER 20 MEQ PO TBCR
40.0000 meq | EXTENDED_RELEASE_TABLET | Freq: Once | ORAL | Status: AC
  Administered 2015-12-21: 40 meq via ORAL
  Filled 2015-12-21: qty 2

## 2015-12-21 MED ORDER — WARFARIN - PHARMACIST DOSING INPATIENT
Freq: Every day | Status: DC
  Administered 2015-12-21 – 2015-12-25 (×4)

## 2015-12-21 MED ORDER — FUROSEMIDE 10 MG/ML IJ SOLN
40.0000 mg | Freq: Once | INTRAMUSCULAR | Status: AC
  Administered 2015-12-21: 40 mg via INTRAVENOUS
  Filled 2015-12-21: qty 4

## 2015-12-21 MED ORDER — INFLUENZA VAC SPLIT QUAD 0.5 ML IM SUSY
0.5000 mL | PREFILLED_SYRINGE | INTRAMUSCULAR | Status: AC
  Administered 2015-12-22: 0.5 mL via INTRAMUSCULAR
  Filled 2015-12-21: qty 0.5

## 2015-12-21 MED ORDER — CHLORHEXIDINE GLUCONATE 0.12 % MT SOLN
15.0000 mL | Freq: Two times a day (BID) | OROMUCOSAL | Status: DC
  Administered 2015-12-21 (×2): 15 mL via OROMUCOSAL
  Filled 2015-12-21: qty 15

## 2015-12-21 MED ORDER — WARFARIN SODIUM 5 MG PO TABS
5.0000 mg | ORAL_TABLET | Freq: Once | ORAL | Status: AC
  Administered 2015-12-21: 5 mg via ORAL
  Filled 2015-12-21: qty 1

## 2015-12-21 NOTE — Progress Notes (Signed)
ANTICOAGULATION CONSULT NOTE - Initial Consult  Pharmacy Consult for warfarin Indication: Atrial fibrillation  No Known Allergies  Patient Measurements: Height: 5\' 5"  (165.1 cm) Weight: 217 lb 6 oz (98.6 kg) IBW/kg (Calculated) : 61.5  Vital Signs: Temp: 98.7 F (37.1 C) (10/01 0900) Temp Source: Oral (10/01 0900) BP: 118/58 (10/01 1215) Pulse Rate: 71 (10/01 1215)  Labs:  Recent Labs  12/20/15 1915 12/21/15 0235  HGB 12.8* 14.2  HCT 40.8 43.9  PLT 291 291  LABPROT 12.5  --   INR 0.93  --   CREATININE 1.02 1.09    Estimated Creatinine Clearance: 58.3 mL/min (by C-G formula based on SCr of 1.09 mg/dL).   Medical History: Past Medical History:  Diagnosis Date  . Atrial flutter ([place])   . BPH (benign prostatic hyperplasia)   . Coronary artery disease    a. s/p MI tx with [place] DES to pRCA in 4/04;  b. Echocardiogram 7/09: Normal LV function.  c. Nuclear study 3/13 no ischemia;  d. ETT 2/14 neg;  e. admx with CP => [facility] (01/30/2013):  pLAD 40-50%, oD1 70-80%, oOM1 40%, pRCA stent patent, mid RCA 30%, EF 60-65%. => med Rx.  . DDD (degenerative disc disease)   . Diabetes mellitus without complication ([place])   . Dyslipidemia   . Hemorrhoids   . Hx MRSA infection    left buttocks abscess  . Hx of echocardiogram    a. Echocardiogram (01/31/2013): Mild focal basal and mild concentric hypertrophy of the septum, EF 50-55%, normal wall motion, grade 1 diastolic dysfunction, trivial AI, MAC, mild LAE, PASP 35  . Hyperlipidemia   . Hypertension   . Hypothyroidism   . Meniere's disease    Status post shunt  . Myocardial infarction 2008  . Occlusion and stenosis of carotid artery without mention of cerebral infarction    40-59% on carotid doppler 2014; [place] (01/2013): R 1-39%, L 60-79%  . Rotator cuff injury    chronic rotator cuff injury status post repair  . Seizure disorder ([place])   . Stroke [facility])    a. 01/2013=> post cardiac cath CVA to L post communicating artery system;  R sided weakness  . Syncope     Assessment:  82 y/o M on 12/20/2015 with SOB. On Xarelto PTA for afib. Discussed drug interaction of Xarelto/carbamazepine with MD. Will change to warfarin. Carbamazepine has potential to decrease the effectiveness of warfarin so will follow closely. Baseline INR 0.93. CBC stable.   Goal of Therapy:  Goal INR 2-3 Monitor platelets by anticoagulation protocol: yes   Plan:  Warfarin 5 mg po x 1 Daily INR Monitor CBC and for S&S of bleed  Angela Burke, PharmD Pharmacy Resident Pager: [phone] 12/21/2015,12:41 PM

## 2015-12-21 NOTE — Progress Notes (Signed)
PULMONARY / CRITICAL CARE MEDICINE   Name: [name] MRN: [id number] DOB: March 12, 1934    ADMISSION DATE:  12/20/2015 CONSULTATION DATE:   REFERRING MD:  EDP  CHIEF COMPLAINT:  Acute respiratory failure d/t hypertensive emergency   SUBJECTIVE:  Feels better  VITAL SIGNS: BP 135/74   Pulse 87   Temp 98.8 F (37.1 C) (Axillary)   Resp (!) 22   Ht 5\' 5"  (1.651 m)   Wt 217 lb 6 oz (98.6 kg)   SpO2 97%   BMI 36.17 kg/m   HEMODYNAMICS:    VENTILATOR SETTINGS: FiO2 (%):  [30 %-60 %] 30 %  INTAKE / OUTPUT:  Intake/Output Summary (Last 24 hours) at 12/21/15 0920 Last data filed at 12/21/15 0900  Gross per 24 hour  Intake            173.9 ml  Output             1600 ml  Net          -1426.1 ml     PHYSICAL EXAMINATION:  General Well nourished, well developed, no distress. Breathing comfortably   HEENT No gross abnormalities. No JVD. MMM.   Pulmonary Equal chest rise, no accessory use. Crackles posteriorly    Cardiovascular Normal rate, regular rhythm. S1, s2. No m/r/g. Distal pulses palpable.  Abdomen Soft, non-tender, non-distended, positive bowel sounds, no palpable organomegaly or masses. Obese. Normoresonant to percussion.  Musculoskeletal Grossly normal.  Lymphatics No cervical, supraclavicular or axillary adenopathy.   Neurologic Grossly intact. No focal deficits.    Skin/Integuement No rash, no cyanosis, no clubbing. No edema. Lipoma right forearm.    LABS:  BMET  Recent Labs Lab 12/15/15 1023 12/20/15 1915 12/21/15 0235  NA 139 139 140  K 4.5 3.6 4.0  CL 101 103 102  CO2 29 25 27   BUN 14 14 17   CREATININE 0.79 1.02 1.09  GLUCOSE 141* 133* 138*    Electrolytes  Recent Labs Lab 12/15/15 1023 12/20/15 1915 12/21/15 0235  CALCIUM 9.4 9.3 9.1  MG  --   --  1.5*  PHOS  --   --  2.7    CBC  Recent Labs Lab 12/15/15 1023 12/20/15 1915 12/21/15 0235  WBC 10.7 9.1 20.8*  HGB 13.3 12.8* 14.2  HCT 39.3 40.8 43.9  PLT 290 291 291     Coag's  Recent Labs Lab 12/20/15 1915  INR 0.93    Sepsis Markers No results for input(s): LATICACIDVEN, PROCALCITON, O2SATVEN in the last 168 hours.  ABG No results for input(s): PHART, PCO2ART, PO2ART in the last 168 hours.  Liver Enzymes  Recent Labs Lab 12/15/15 1023  AST 18  ALT 16  ALKPHOS 73  BILITOT 0.5  ALBUMIN 4.2    Cardiac Enzymes No results for input(s): TROPONINI, PROBNP in the last 168 hours.  Glucose No results for input(s): GLUCAP in the last 168 hours.  Imaging Ct Head Wo Contrast  Result Date: 12/20/2015 CLINICAL DATA:  82 y/o M; headache, nausea, and vomiting, similar history of right endolymphatic shunt decompression. EXAM: CT HEAD WITHOUT CONTRAST TECHNIQUE: Contiguous axial images were obtained from the base of the skull through the vertex without intravenous contrast. COMPARISON:  07/24/2015 CT head.  01/31/2013 MRI brain. FINDINGS: Brain: No evidence of acute infarction, hemorrhage, hydrocephalus, extra-axial collection or mass lesion/mass effect. Mild parenchymal volume loss and chronic microvascular ischemic changes are stable. Stable lucencies in the left thalamus, left pons and left cerebellar hemisphere consistent with old lacunar  infarcts. Vascular: Extensive calcific atherosclerosis of internal carotid arteries and vertebrobasilar system. No hyperdense vessel. Skull: Wall up right mastoidectomy, no soft tissue within the mastoidectomy bowl. The calvarium is otherwise unremarkable. Sinuses/Orbits: No acute finding. Opacification of right greater than left external auditory canals probably represents cerumen. Other: None. IMPRESSION: No acute intracranial abnormality is identified. Stable chronic microvascular ischemic changes, parenchymal volume loss, intracranial calcific atherosclerosis, and postsurgical changes related to right wall up mastoidectomy. Electronically Signed   By: [name] M.D.   On: 12/20/2015 22:45   Dg Chest  Portable 1 View  Result Date: 12/20/2015 CLINICAL DATA:  Acute onset of generalized chest pain and shortness of breath. Initial encounter. EXAM: PORTABLE CHEST 1 VIEW COMPARISON:  Chest radiograph from 10/16/2015 FINDINGS: Diffuse right-sided airspace opacity and mild left basilar airspace opacity are seen, concerning for multifocal pneumonia. Asymmetric pulmonary edema could have a similar appearance, though it is considered less likely. No pleural effusion or pneumothorax is seen. The cardiomediastinal silhouette remains normal in size. No acute osseous abnormalities are identified. External pacing pads are seen. IMPRESSION: Diffuse right-sided airspace opacity and mild left basilar airspace opacity, concerning for multifocal pneumonia. Asymmetric pulmonary edema could have a similar appearance, though it is considered less likely. Electronically Signed   By: [name] M.D.   On: 12/20/2015 21:08  PCXR: diffuse edema on admit   STUDIES:    CULTURES: None  ANTIBIOTICS: None  SIGNIFICANT EVENTS:   LINES/TUBES: PIV  DISCUSSION: [name] is an 82M with multiple medical problems including CAD s/p stent, a flutter, diastolic dysfunction, diabetes mellitus type 2, prior CVA without significant residual deficits, hypothyroidism, HTN, HLD, and Meniere's disease, who presented with hypertensive emergency with pulmonary edema, increased work of breathing and shortness of breath. He is now almost a liter and 1/2 negative and off BIPAP. For today goal is to wean ntg & Oxygen off, add low salt diet, resume antihypertensives, cont lasix, mobilize and have cardiology formally see. Will move him to the SDU later today if No further issues.   ASSESSMENT / PLAN:  PULMONARY A: Acute hypoxemic respiratory failure 2/2 flash pulmonary edema / hypertensive emergency P:   Change BIPAP to PRN Wean FIO2 Repeat am cxr  Cont lasix  Keep ICU status another 24hrs  CARDIOVASCULAR A: Hypertensive  emergency associated with flash pulmonary edema and strain pattern on EKG H/o atrial flutter ->Cardiology was called by the ED and feels ECG changes are 2/2 hypertensive emergency P:  Wean ntg to off Resume home BP meds (Clonidine, isosorbide, cozaar & lopressor)  , titrate as needed Cont xarelto and asa  Cont lipitor  Cont lasix Will ask cards to formally see. Wonder if he needs further ischemia    RENAL A:   No acute issues Hypomagnesemia  P:   Strict I/Os Monitor renal function Monitor lytes and replace as needed  GASTROINTESTINAL A:   Intermittent nausea  P:   PRN bowel regimen Ok to [employer]   HEMATOLOGIC A:   Chronic normocytic anemia P:  No intervention Monitor  INFECTIOUS A:   No acute issues P:   No abx at this time  ENDOCRINE A:   Hx DMII Hx hypothyroidism   P:   Continue home synthroid CBG + SSI while inpt  NEUROLOGIC A:   Hx CVA Hx seizures H/o vertigo -->could this have been cardiac related? P:   RASS goal: 0 Continue carbamazapine Continue plavix   FAMILY  - Updates: Patient and family. He desires  to be FULL CODE.   - Inter-disciplinary family meet or Palliative Care meeting due by:  day [place] ACNP-BC [place] Pager # [phone] OR # [phone] if no answer     12/21/2015, 9:18 AM

## 2015-12-21 NOTE — Discharge Instructions (Signed)

## 2015-12-21 NOTE — Consult Note (Signed)
CARDIOLOGY CONSULT NOTE   Patient ID: [name] MRN: [id number] DOB/AGE: 11/17/1933 82 y.o.  Admit date: 12/20/2015  Primary Physician   Odette Fraction, MD Primary Cardiologist   Dr. Percival Spanish Reason for Consultation   Hypertensive urgency and pulmonary edema Requesting Physician  Dr.  Elsworth Soho  HPI: [name] is a 82 y.o. male with a history of multiple medical problem including CAD, CVA, DM, aflutter, HLD and HTN who admitted 12/20/15 with acute hypoxic respiratory failure in setting of pulmonary edema and hypertensive urgency.    After his catheterization (LAST) n November 2014 he had a CVA. This was embolic following the procedure. He has had a significant recovery from this but still has some visual deficits on his right side and right-sided arm and leg weakness.  Seen by Dr. Percival Spanish 10/27/15 after 2 years and noted to be in atrial flutter and xarelto was started for anticoagulation. CHA2DS2 - VASc score of 7. He was doing well on cardiac stand point when last seen by Dr. Percival Spanish 12/04/15.   Echo 11/11/15 showed LV EF of 50-55%, no wm abnormality, grade 1 DD, PA pressure of 32 mm Hg.  48 hours holder monitor 11/11/15: NSR and sinus tach. No atrial flutter. Rare atrial and ventricular ectopy.   Carotid doppler 12/12/15 showed 1-39% Right ICA stenosis. 40-59% Left ICA stenosis. (improved)  He was in [place] up until few day ago he had noted slight dyspnea that suddenly got worse yesterday with chest pressure, lightheadedness, nausea and BP of 199/91 at home leading to ER presentation. Denies orthopnea, PND, syncope or recent LE edema. Compliant with diet and medications.   Upon ER presentation his BP was 209/83. Oxygen saturation of 78%. S/s improved on IV nitro and BiPAP. Chest xray showed pulmonary edema. CT of head without acute abnormality. EKG on presentation showed sinus rhythm at rate of 83 bpm, ST/T wave changes in inferior leads and V6 which is new compared to prior ekg of  10/27/15. Which resolved in subsequent EKG. EKG today showed new TWI in lead I and aVL. POC troponin negative. WBC 20.8. His breathing has been improved. Resolution of chest pain.   Past Medical History:  Diagnosis Date  . Atrial flutter ([place])   . BPH (benign prostatic hyperplasia)   . Coronary artery disease    a. s/p MI tx with Cypher DES to pRCA in 4/04;  b. Echocardiogram 7/09: Normal LV function.  c. Nuclear study 3/13 no ischemia;  d. ETT 2/14 neg;  e. admx with CP => [facility] (01/30/2013):  pLAD 40-50%, oD1 70-80%, oOM1 40%, pRCA stent patent, mid RCA 30%, EF 60-65%. => med Rx.  . DDD (degenerative disc disease)   . Diabetes mellitus without complication ([place])   . Dyslipidemia   . Hemorrhoids   . Hx MRSA infection    left buttocks abscess  . Hx of echocardiogram    a. Echocardiogram (01/31/2013): Mild focal basal and mild concentric hypertrophy of the septum, EF 50-55%, normal wall motion, grade 1 diastolic dysfunction, trivial AI, MAC, mild LAE, PASP 35  . Hyperlipidemia   . Hypertension   . Hypothyroidism   . Meniere's disease    Status post shunt  . Myocardial infarction 2008  . Occlusion and stenosis of carotid artery without mention of cerebral infarction    40-59% on carotid doppler 2014; [place] (01/2013): R 1-39%, L 60-79%  . Rotator cuff injury    chronic rotator cuff injury status post repair  . Seizure disorder ([place])   .  Stroke [facility])    a. 01/2013=> post cardiac cath CVA to L post communicating artery system; R sided weakness  . Syncope      Past Surgical History:  Procedure Laterality Date  . COLONOSCOPY    . CORONARY ANGIOPLASTY WITH STENT PLACEMENT  07/14/2002   Stent to the right coronary artery  . ENDOLYMPHATIC SHUNT DECOMPRESSION  06/11/2009    Right endolymphatic sac decompression and shunt  placement  . HERNIA REPAIR  04/10/2008   scrotal hernia repair  . LEFT HEART CATHETERIZATION WITH CORONARY ANGIOGRAM N/A 01/30/2013   Procedure: LEFT HEART CATHETERIZATION  WITH CORONARY ANGIOGRAM;  Surgeon: Larey Dresser, MD;  Location: [facility] CATH LAB;  Service: Cardiovascular;  Laterality: N/A;  . ROTATOR CUFF REPAIR     for chronic rotator cuff injury    No Known Allergies  I have reviewed the patient's current medications . atorvastatin  80 mg Oral Daily  . carbamazepine  300 mg Oral BID  . chlorhexidine  15 mL Mouth Rinse BID  . cloNIDine  0.1 mg Oral BID  . furosemide  40 mg Intravenous Once  . [START ON 12/22/2015] Influenza vac split quadrivalent PF  0.5 mL Intramuscular Tomorrow-1000  . isosorbide mononitrate  30 mg Oral Daily  . levothyroxine  75 mcg Oral QAC breakfast  . losartan  50 mg Oral BID  . magnesium sulfate 1 - 4 g bolus IVPB  4 g Intravenous Once  . mouth rinse  15 mL Mouth Rinse q12n4p  . metoprolol  50 mg Oral BID  . pantoprazole  40 mg Oral Daily  . potassium chloride  40 mEq Oral Once  . rivaroxaban  20 mg Oral Q supper   . nitroGLYCERIN 15 mcg/min (12/21/15 1015)   sodium chloride  Prior to Admission medications   Medication Sig Start Date End Date Taking? Authorizing Provider  atorvastatin (LIPITOR) 80 MG tablet TAKE 1 TABLET BY MOUTH EVERY DAY 06/23/15   [name], MD  carbamazepine (CARBATROL) 300 MG 12 hr capsule Take 1 capsule (300 mg total) by mouth 2 (two) times daily. 10/09/15   [name], MD  cephALEXin (KEFLEX) 250 MG capsule Take 250 mg by mouth daily.    Historical Provider, MD  cloNIDine (CATAPRES) 0.1 MG tablet TAKE 1 TABLET TWICE A DAY 06/17/15   [name], MD  diazepam (VALIUM) 2 MG tablet TAKE 1 TABLET BY MOUTH TWICE DAILY 10/23/15   [name], MD  isosorbide mononitrate (IMDUR) 30 MG 24 hr tablet Take 1 tablet by mouth daily. 10/20/15   Historical Provider, MD  levothyroxine (SYNTHROID, LEVOTHROID) 25 MCG tablet Take 25 mcg by mouth daily before breakfast.    Historical Provider, MD  levothyroxine (SYNTHROID, LEVOTHROID) 75 MCG tablet Take 1 tablet (75 mcg total) by mouth daily. 10/31/15    [name], MD  losartan (COZAAR) 100 MG tablet TAKE 1/2 TABLET BY MOUTH 2 TIMES A DAY 06/05/15   [name], MD  metFORMIN (GLUCOPHAGE) 500 MG tablet TAKE 1 TABLET TWICE A DAY WITH A MEAL 10/09/15   [name], MD  metoCLOPramide (REGLAN) 10 MG tablet Take 1 tablet by mouth 3 (three) times daily. 10/20/15   Historical Provider, MD  metoprolol (LOPRESSOR) 50 MG tablet TAKE 1 TABLET BY MOUTH TWICE A DAY 05/05/15   [name], MD  Multiple Vitamins-Minerals (MULTIVITAMINS THER. W/MINERALS) TABS Take 1 tablet by mouth at bedtime.      Historical Provider, MD  pantoprazole ([place])  40 MG tablet Take 1 tablet by mouth daily. 10/20/15   Historical Provider, MD  rivaroxaban (XARELTO) 20 MG TABS tablet Take 1 tablet (20 mg total) by mouth daily with supper. 10/27/15   Minus Breeding, MD  silodosin (RAPAFLO) 8 MG CAPS capsule Take 1 capsule (8 mg total) by mouth daily with breakfast. 06/14/14   [name], MD     Social History   Social History  . Marital status: Married    Spouse name: N/A  . Number of children: 4  . Years of education: 12th   Occupational History  . Retired    Social History Main Topics  . Smoking status: Former Smoker    Quit date: 08/19/1961  . Smokeless tobacco: Former Systems developer    Types: Chew  . Alcohol use No  . Drug use: No  . Sexual activity: No   Other Topics Concern  . Not on file   Social History Narrative   Lives with wife.    Family Status  Relation Status  . Father Deceased at age 65   MI  . Mother Deceased at age 6's   of brain aneurysm  . Brother   . Brother   . Brother   . Brother   . Neg Hx    Family History  Problem Relation Age of Onset  . Heart attack Father 22  . Aneurysm Mother     brain aneurysm  . Coronary artery disease Brother     with CABG  . Diabetes Brother   . Colon cancer Neg Hx   . Colon polyps Neg Hx       ROS:  Full 14 point review of systems complete and found to be negative unless listed  above.  Physical Exam: Blood pressure 117/64, pulse 88, temperature 98.7 F (37.1 C), temperature source Oral, resp. rate 19, height 5\' 5"  (1.651 m), weight 217 lb 6 oz (98.6 kg), SpO2 95 %.  General: Well developed, well nourished, male in no acute distress oxygen in placed Head: Eyes PERRLA, No xanthomas. Normocephalic and atraumatic, oropharynx without edema or exudate.  Lungs: Resp regular and unlabored. Diminished breath sound at base Heart: RRR no s3, s4, or murmurs..   Neck: No carotid bruits. No lymphadenopathy. No JVD. Abdomen: Bowel sounds present, abdomen soft and non-tender without masses or hernias noted. Msk:  No spine or cva tenderness. No weakness, no joint deformities or effusions. Extremities: No clubbing, cyanosis or edema. DP/PT/Radials 2+ and equal bilaterally. Neuro: Alert and oriented X 3. No focal deficits noted. Psych:  Good affect, responds appropriately Skin: No rashes or lesions noted.  Labs:   Lab Results  Component Value Date   WBC 20.8 (H) 12/21/2015   HGB 14.2 12/21/2015   HCT 43.9 12/21/2015   MCV 92.0 12/21/2015   PLT 291 12/21/2015    Recent Labs  12/20/15 1915  INR 0.93    Recent Labs Lab 12/15/15 1023  12/21/15 0235  NA 139  < > 140  K 4.5  < > 4.0  CL 101  < > 102  CO2 29  < > 27  BUN 14  < > 17  CREATININE 0.79  < > 1.09  CALCIUM 9.4  < > 9.1  PROT 6.2  --   --   BILITOT 0.5  --   --   ALKPHOS 73  --   --   ALT 16  --   --   AST 18  --   --  GLUCOSE 141*  < > 138*  ALBUMIN 4.2  --   --   < > = values in this interval not displayed. Magnesium  Date Value Ref Range Status  12/21/2015 1.5 (L) 1.7 - 2.4 mg/dL Final   No results for input(s): CKTOTAL, CKMB, TROPONINI in the last 72 hours.  Recent Labs  12/20/15 1927  TROPIPOC 0.02   No results found for: PROBNP Lab Results  Component Value Date   CHOL 169 11/15/2014   HDL 50 11/15/2014   LDLCALC 91 11/15/2014   TRIG 139 11/15/2014   No results found for:  DDIMER Lipase  Date/Time Value Ref Range Status  12/15/2015 10:23 AM 22 7 - 60 U/L Final   TSH  Date/Time Value Ref Range Status  10/30/2015 10:34 AM 4.71 (H) 0.40 - 4.50 mIU/L Final     Radiology:  Ct Head Wo Contrast  Result Date: 12/20/2015 CLINICAL DATA:  82 y/o M; headache, nausea, and vomiting, similar history of right endolymphatic shunt decompression. EXAM: CT HEAD WITHOUT CONTRAST TECHNIQUE: Contiguous axial images were obtained from the base of the skull through the vertex without intravenous contrast. COMPARISON:  07/24/2015 CT head.  01/31/2013 MRI brain. FINDINGS: Brain: No evidence of acute infarction, hemorrhage, hydrocephalus, extra-axial collection or mass lesion/mass effect. Mild parenchymal volume loss and chronic microvascular ischemic changes are stable. Stable lucencies in the left thalamus, left pons and left cerebellar hemisphere consistent with old lacunar infarcts. Vascular: Extensive calcific atherosclerosis of internal carotid arteries and vertebrobasilar system. No hyperdense vessel. Skull: Wall up right mastoidectomy, no soft tissue within the mastoidectomy bowl. The calvarium is otherwise unremarkable. Sinuses/Orbits: No acute finding. Opacification of right greater than left external auditory canals probably represents cerumen. Other: None. IMPRESSION: No acute intracranial abnormality is identified. Stable chronic microvascular ischemic changes, parenchymal volume loss, intracranial calcific atherosclerosis, and postsurgical changes related to right wall up mastoidectomy. Electronically Signed   By: Kristine Garbe M.D.   On: 12/20/2015 22:45   Dg Chest Portable 1 View  Result Date: 12/20/2015 CLINICAL DATA:  Acute onset of generalized chest pain and shortness of breath. Initial encounter. EXAM: PORTABLE CHEST 1 VIEW COMPARISON:  Chest radiograph from 10/16/2015 FINDINGS: Diffuse right-sided airspace opacity and mild left basilar airspace opacity are seen,  concerning for multifocal pneumonia. Asymmetric pulmonary edema could have a similar appearance, though it is considered less likely. No pleural effusion or pneumothorax is seen. The cardiomediastinal silhouette remains normal in size. No acute osseous abnormalities are identified. External pacing pads are seen. IMPRESSION: Diffuse right-sided airspace opacity and mild left basilar airspace opacity, concerning for multifocal pneumonia. Asymmetric pulmonary edema could have a similar appearance, though it is considered less likely. Electronically Signed   By: Garald Balding M.D.   On: 12/20/2015 21:08    ASSESSMENT AND PLAN:     1.  Hypertensive emergency - now improved and back to normal. Likely due to acute respiratory failure. Wean of IV nitro. Continue metoprolol  50mg  BID, Losartan 50mg , Imdur 30mg , and clonidin 0.1mg  BID.  2. CAD s/p DES to pRCA - Last cath (01/30/2013) showed  pLAD 40-50%, oD1 70-80%, oOM1 40%, pRCA stent patent, mid RCA 30%, EF 60-65%. => med Rx - Echo 11/11/15 showed LV EF of 50-55%, no wm abnormality, grade 1 DD, PA pressure of 32 mm Hg.  - Continue statin. Not on ASA due to need of anticoagulation - Yesterday had chest pressure in setting of hypertensive urgency and acute hypoxic respiratory failure. EKG with intermittent new  ST/T  changes. Will review with MD  3. Carotid stenosis - Carotid doppler 12/12/15 showed 1-39% Right ICA stenosis. 40-59% Left ICA stenosis. (imrpoved)  4. Paroxysmal atrial flutter - Maintaining sinus rhythm. CHADSVASCs score of 7. On  Xarelto. Pharmacy called with drug interaction of Xarelto with Carbamazepine. Will review with MD.   SignedLeanor Kail, PA 12/21/2015, 10:25 AM Pager [phone]  Co-Sign MD  History and all data above reviewed.  Patient examined.  I agree with the findings as above.  He was doing OK when he was seen last in the office in [place].  However, over the last 10 days he reports that his BP has been going up.   He came to the ED yesterday because the BP ws 99991111 systolic.  In the ED he got agitated because he could not use the urinal and then went into pulmonary edema.  He has responded well to diuretics.  The patient exam reveals COR:RRR  ,  Lungs: Diffuse crackles one third of the way up.   ,  Abd: Positive bowel sounds, no rebound no guarding, Ext No edema  .  All available labs, radiology testing, previous records reviewed. Agree with documented assessment and plan. HTN urgency.  I don't suspect ischemia.  No suggestion of recurrent arrhythmia.  I am not sure why his BP has been rising.  He will likely need some baseline diuretic at home.  Currently BP is lower and IV NTG is held.  We can increase his home dose of Imdur at discharge.  If his BP is elevated prior to discharge start Norvasc.  I will order IV bid Lasix today as he still has some crackles.   Flutter:  No recurrent flutter.  I discussed Xarelto with pharmacy.  I have read that there is a moderate interaction with Xarelto or Eliquis and Tegeretol.  However their literature suggests that it is a high interaction.  Tegretol reduces the efficacy of Xarelto.  The data are somewhat spare.  However, given this, after consultation with pharamcy, we will need to switch to Warfarin.      Jeneen Rinks Hochrein  12:24 PM  12/21/2015

## 2015-12-22 ENCOUNTER — Inpatient Hospital Stay (HOSPITAL_COMMUNITY): Payer: Medicare Other

## 2015-12-22 DIAGNOSIS — E118 Type 2 diabetes mellitus with unspecified complications: Secondary | ICD-10-CM

## 2015-12-22 DIAGNOSIS — J810 Acute pulmonary edema: Secondary | ICD-10-CM

## 2015-12-22 DIAGNOSIS — J811 Chronic pulmonary edema: Secondary | ICD-10-CM

## 2015-12-22 DIAGNOSIS — I248 Other forms of acute ischemic heart disease: Secondary | ICD-10-CM

## 2015-12-22 DIAGNOSIS — E039 Hypothyroidism, unspecified: Secondary | ICD-10-CM

## 2015-12-22 DIAGNOSIS — I480 Paroxysmal atrial fibrillation: Secondary | ICD-10-CM

## 2015-12-22 DIAGNOSIS — I25810 Atherosclerosis of coronary artery bypass graft(s) without angina pectoris: Secondary | ICD-10-CM

## 2015-12-22 LAB — CBC
HCT: 38.1 % — ABNORMAL LOW (ref 39.0–52.0)
Hemoglobin: 12.2 g/dL — ABNORMAL LOW (ref 13.0–17.0)
MCH: 29.7 pg (ref 26.0–34.0)
MCHC: 32 g/dL (ref 30.0–36.0)
MCV: 92.7 fL (ref 78.0–100.0)
Platelets: 207 10*3/uL (ref 150–400)
RBC: 4.11 MIL/uL — ABNORMAL LOW (ref 4.22–5.81)
RDW: 15.6 % — ABNORMAL HIGH (ref 11.5–15.5)
WBC: 9.5 10*3/uL (ref 4.0–10.5)

## 2015-12-22 LAB — BASIC METABOLIC PANEL
Anion gap: 7 (ref 5–15)
BUN: 19 mg/dL (ref 6–20)
CO2: 30 mmol/L (ref 22–32)
Calcium: 8.7 mg/dL — ABNORMAL LOW (ref 8.9–10.3)
Chloride: 102 mmol/L (ref 101–111)
Creatinine, Ser: 1.09 mg/dL (ref 0.61–1.24)
GFR calc Af Amer: >60 mL/min (ref >60)
GFR calc non Af Amer: >60 mL/min (ref >60)
Glucose, Bld: 133 mg/dL — ABNORMAL HIGH (ref 65–99)
Potassium: 3.8 mmol/L (ref 3.5–5.1)
Sodium: 139 mmol/L (ref 135–145)

## 2015-12-22 LAB — PROTIME-INR
INR: 1.12
Prothrombin Time: 14.4 seconds (ref 11.4–15.2)

## 2015-12-22 MED ORDER — WARFARIN SODIUM 5 MG PO TABS
5.0000 mg | ORAL_TABLET | Freq: Once | ORAL | Status: AC
  Administered 2015-12-22: 5 mg via ORAL
  Filled 2015-12-22: qty 1

## 2015-12-22 MED ORDER — FUROSEMIDE 20 MG PO TABS
20.0000 mg | ORAL_TABLET | Freq: Every day | ORAL | Status: DC
  Administered 2015-12-22 – 2015-12-26 (×5): 20 mg via ORAL
  Filled 2015-12-22 (×5): qty 1

## 2015-12-22 NOTE — Progress Notes (Signed)
TELEMETRY: Reviewed telemetry pt in NSR: Vitals:   12/22/15 0300 12/22/15 0400 12/22/15 0500 12/22/15 0600  BP: (!) 108/59 127/63 (!) 121/59 133/60  Pulse: 73 77 73 72  Resp: (!) 21 (!) 22 20 18   Temp:  98.2 F (36.8 C)    TempSrc:  Oral    SpO2: 91% 90% 90% 91%  Weight:    214 lb 11.7 oz (97.4 kg)  Height:    5\' 5"  (1.651 m)    Intake/Output Summary (Last 24 hours) at 12/22/15 0929 Last data filed at 12/22/15 0600  Gross per 24 hour  Intake           845.84 ml  Output             1450 ml  Net          -604.16 ml   Filed Weights   12/21/15 0235 12/22/15 0600  Weight: 217 lb 6 oz (98.6 kg) 214 lb 11.7 oz (97.4 kg)    Subjective  Feels very well. No chest pain or SOB. Has not ambulated yet.  Marland Kitchen atorvastatin  80 mg Oral Daily  . carbamazepine  300 mg Oral BID  . cloNIDine  0.1 mg Oral BID  . Influenza vac split quadrivalent PF  0.5 mL Intramuscular Tomorrow-1000  . isosorbide mononitrate  30 mg Oral Daily  . levothyroxine  75 mcg Oral QAC breakfast  . losartan  50 mg Oral BID  . metoprolol  50 mg Oral BID  . pantoprazole  40 mg Oral Daily  . Warfarin - Pharmacist Dosing Inpatient   Does not apply q1800   . nitroGLYCERIN Stopped (12/21/15 1211)    LABS: Basic Metabolic Panel:  Recent Labs  12/21/15 0235 12/22/15 0346  NA 140 139  K 4.0 3.8  CL 102 102  CO2 27 30  GLUCOSE 138* 133*  BUN 17 19  CREATININE 1.09 1.09  CALCIUM 9.1 8.7*  MG 1.5*  --   PHOS 2.7  --    Liver Function Tests: No results for input(s): AST, ALT, ALKPHOS, BILITOT, PROT, ALBUMIN in the last 72 hours. No results for input(s): LIPASE, AMYLASE in the last 72 hours. CBC:  Recent Labs  12/21/15 0235 12/22/15 0346  WBC 20.8* 9.5  HGB 14.2 12.2*  HCT 43.9 38.1*  MCV 92.0 92.7  PLT 291 207   Cardiac Enzymes:  Recent Labs  12/21/15 1137 12/21/15 1517 12/21/15 2148  TROPONINI 0.07* 0.07* 0.06*   BNP: No results for input(s): PROBNP in the last 72 hours. D-Dimer: No  results for input(s): DDIMER in the last 72 hours. Hemoglobin A1C: No results for input(s): HGBA1C in the last 72 hours. Fasting Lipid Panel: No results for input(s): CHOL, HDL, LDLCALC, TRIG, CHOLHDL, LDLDIRECT in the last 72 hours. Thyroid Function Tests: No results for input(s): TSH, T4TOTAL, T3FREE, THYROIDAB in the last 72 hours.  Invalid input(s): FREET3   Radiology/Studies:  Ct Head Wo Contrast  Result Date: 12/20/2015 CLINICAL DATA:  82 y/o M; headache, nausea, and vomiting, similar history of right endolymphatic shunt decompression. EXAM: CT HEAD WITHOUT CONTRAST TECHNIQUE: Contiguous axial images were obtained from the base of the skull through the vertex without intravenous contrast. COMPARISON:  07/24/2015 CT head.  01/31/2013 MRI brain. FINDINGS: Brain: No evidence of acute infarction, hemorrhage, hydrocephalus, extra-axial collection or mass lesion/mass effect. Mild parenchymal volume loss and chronic microvascular ischemic changes are stable. Stable lucencies in the left thalamus, left pons and left cerebellar hemisphere consistent with old  lacunar infarcts. Vascular: Extensive calcific atherosclerosis of internal carotid arteries and vertebrobasilar system. No hyperdense vessel. Skull: Wall up right mastoidectomy, no soft tissue within the mastoidectomy bowl. The calvarium is otherwise unremarkable. Sinuses/Orbits: No acute finding. Opacification of right greater than left external auditory canals probably represents cerumen. Other: None. IMPRESSION: No acute intracranial abnormality is identified. Stable chronic microvascular ischemic changes, parenchymal volume loss, intracranial calcific atherosclerosis, and postsurgical changes related to right wall up mastoidectomy. Electronically Signed   By: Kristine Garbe M.D.   On: 12/20/2015 22:45   Dg Chest Port 1 View  Result Date: 12/22/2015 CLINICAL DATA:  Pulmonary edema. EXAM: PORTABLE CHEST 1 VIEW COMPARISON:  12/20/2015.  FINDINGS: Cardiomegaly. Multifocal bilateral pulmonary infiltrates and/or edema again noted. Partial interval clearing. No pleural effusion or pneumothorax. IMPRESSION: Multifocal bilateral pulmonary infiltrates and/or edema again noted. Partial interval clearing. Electronically Signed   By: Marcello Moores  Register   On: 12/22/2015 07:07   Dg Chest Portable 1 View  Result Date: 12/20/2015 CLINICAL DATA:  Acute onset of generalized chest pain and shortness of breath. Initial encounter. EXAM: PORTABLE CHEST 1 VIEW COMPARISON:  Chest radiograph from 10/16/2015 FINDINGS: Diffuse right-sided airspace opacity and mild left basilar airspace opacity are seen, concerning for multifocal pneumonia. Asymmetric pulmonary edema could have a similar appearance, though it is considered less likely. No pleural effusion or pneumothorax is seen. The cardiomediastinal silhouette remains normal in size. No acute osseous abnormalities are identified. External pacing pads are seen. IMPRESSION: Diffuse right-sided airspace opacity and mild left basilar airspace opacity, concerning for multifocal pneumonia. Asymmetric pulmonary edema could have a similar appearance, though it is considered less likely. Electronically Signed   By: Garald Balding M.D.   On: 12/20/2015 21:08   Ecg shows NSR with LVH. T wave inversion in the inferior leads.  PHYSICAL EXAM General: Well developed, well nourished, in no acute distress. Head: Normocephalic, atraumatic, sclera non-icteric, oropharynx is clear Neck: Negative for carotid bruits. JVD not elevated. No adenopathy Lungs: Clear bilaterally to auscultation without wheezes, rales, or rhonchi. Breathing is unlabored. Heart: RRR S1 S2 without murmurs, rubs, or gallops.  Abdomen: Soft, non-tender, non-distended with normoactive bowel sounds. No hepatomegaly. No rebound/guarding. No obvious abdominal masses. Msk:  Strength and tone appears normal for age. Extremities: No clubbing, cyanosis or edema.   Distal pedal pulses are 2+ and equal bilaterally. Neuro: Alert and oriented X 3. Moves all extremities spontaneously. Psych:  Responds to questions appropriately with a normal affect.  ASSESSMENT AND PLAN: 1.  Hypertensive emergency - now improved and back to normal. BP well controlled throughout the day yesterday. A little elevated this am but hasn't received meds yet.   Continue metoprolol  50mg  BID, Losartan 50mg , Imdur 30mg , and clonidin 0.1mg  BID. Will add lasix 20 mg daily.  2. CAD s/p DES to pRCA - Last cath (01/30/2013) showed  pLAD 40-50%, oD1 70-80%, oOM1 40%, pRCA stent patent, mid RCA 30%, EF 60-65%. => med Rx - Echo 11/11/15 showed LV EF of 50-55%, no wm abnormality, grade 1 DD, PA pressure of 32 mm Hg.  - Continue statin. Not on ASA due to need of anticoagulation - Yesterday had chest pressure in setting of hypertensive urgency and acute hypoxic respiratory failure. EKG with intermittent new ST/T  changes. Troponin levels minimally elevated with flat trend most consistent with demand ischemia due to #1. No further ischemic work up planned at this time.   3. Carotid stenosis - Carotid doppler 12/12/15 showed 1-39% Right ICA stenosis. 40-59%  Left ICA stenosis. (imrpoved)  4. Paroxysmal atrial flutter - Maintaining sinus rhythm. CHADSVASCs score of 7. Now on coumadin due to concerns of interaction with Tegretol and Xarelto.    Present on Admission: . Hypertensive emergency   Signed, Peter Martinique, [place] 12/22/2015 9:29 AM

## 2015-12-22 NOTE — Progress Notes (Signed)
PULMONARY / CRITICAL CARE MEDICINE   Name: [name] MRN: [id number] DOB: [birth year]    ADMISSION DATE:  12/20/2015 CONSULTATION DATE:   REFERRING MD:  EDP  CHIEF COMPLAINT:  Acute respiratory failure d/t hypertensive emergency   SUBJECTIVE:  Doing well, no complaints.  VITAL SIGNS: BP (!) 141/89   Pulse 70   Temp 97.4 F (36.3 C) (Oral)   Resp (!) 6   Ht 5\' 5"  (1.651 m)   Wt 214 lb 11.7 oz (97.4 kg)   SpO2 94%   BMI 35.73 kg/m   HEMODYNAMICS:    VENTILATOR SETTINGS:    INTAKE / OUTPUT:  Intake/Output Summary (Last 24 hours) at 12/22/15 1938 Last data filed at 12/22/15 1300  Gross per 24 hour  Intake             1085 ml  Output             1700 ml  Net             -615 ml     PHYSICAL EXAMINATION:  General Well nourished, well developed, no distress. Visiting with family.  HEENT No gross abnormalities. No JVD. MMM.   Pulmonary Equal chest rise, no accessory use. Clear bilaterally.  Cardiovascular Normal rate, regular rhythm. S1, s2. No m/r/g. Distal pulses palpable.  Abdomen Soft, non-tender, non-distended, positive bowel sounds, no palpable organomegaly or masses. Obese. Normoresonant to percussion.  Musculoskeletal Grossly normal.  Lymphatics No cervical, supraclavicular or axillary adenopathy.   Neurologic Grossly intact. No focal deficits.    Skin/Integuement No rash, no cyanosis, no clubbing. No edema. Lipoma right forearm.    LABS:  BMET  Recent Labs Lab 12/20/15 1915 12/21/15 0235 12/22/15 0346  NA 139 140 139  K 3.6 4.0 3.8  CL 103 102 102  CO2 25 27 30   BUN 14 17 19   CREATININE 1.02 1.09 1.09  GLUCOSE 133* 138* 133*    Electrolytes  Recent Labs Lab 12/20/15 1915 12/21/15 0235 12/22/15 0346  CALCIUM 9.3 9.1 8.7*  MG  --  1.5*  --   PHOS  --  2.7  --     CBC  Recent Labs Lab 12/20/15 1915 12/21/15 0235 12/22/15 0346  WBC 9.1 20.8* 9.5  HGB 12.8* 14.2 12.2*  HCT 40.8 43.9 38.1*  PLT 291 291 207     Coag's  Recent Labs Lab 12/20/15 1915 12/22/15 0346  INR 0.93 1.12    Sepsis Markers No results for input(s): LATICACIDVEN, PROCALCITON, O2SATVEN in the last 168 hours.  ABG No results for input(s): PHART, PCO2ART, PO2ART in the last 168 hours.  Liver Enzymes No results for input(s): AST, ALT, ALKPHOS, BILITOT, ALBUMIN in the last 168 hours.  Cardiac Enzymes  Recent Labs Lab 12/21/15 1137 12/21/15 1517 12/21/15 2148  TROPONINI 0.07* 0.07* 0.06*    Glucose No results for input(s): GLUCAP in the last 168 hours.  Imaging Dg Chest Port 1 View  Result Date: 12/22/2015 CLINICAL DATA:  Pulmonary edema. EXAM: PORTABLE CHEST 1 VIEW COMPARISON:  12/20/2015. FINDINGS: Cardiomegaly. Multifocal bilateral pulmonary infiltrates and/or edema again noted. Partial interval clearing. No pleural effusion or pneumothorax. IMPRESSION: Multifocal bilateral pulmonary infiltrates and/or edema again noted. Partial interval clearing. Electronically Signed   By: Marcello Moores  Register   On: 12/22/2015 07:07  PCXR: diffuse edema on admit   STUDIES:    CULTURES: None  ANTIBIOTICS: None  SIGNIFICANT EVENTS:   LINES/TUBES: PIV  DISCUSSION: [name] is an 57M with multiple medical problems  including CAD s/p stent, a flutter, diastolic dysfunction, diabetes mellitus type 2, prior CVA without significant residual deficits, hypothyroidism, HTN, HLD, and Meniere's disease, who presented with hypertensive emergency with pulmonary edema, increased work of breathing and shortness of breath. He is now almost a liter and 1/2 negative and off BIPAP. For today goal is to wean ntg & Oxygen off, add low salt diet, resume antihypertensives, cont lasix, mobilize and have cardiology formally see.  ASSESSMENT / PLAN:  PULMONARY A: Acute hypoxemic respiratory failure 2/2 flash pulmonary edema / hypertensive emergency - resolved. P:   Change BIPAP to PRN. Wean FiO2. Cont  lasix.  CARDIOVASCULAR A: Hypertensive emergency associated with flash pulmonary edema and strain pattern on EKG - resolved. H/o atrial flutter. P:  Cardiology following, adjusting BP meds. Continue Warfarin (exchanged for xarelto due to interactions with carbamazepine), lipitor, lasix.  RENAL A:   No acute issues. P:   Strict I/Os. Monitor renal function. Monitor lytes and replace as needed.  GASTROINTESTINAL A:   GERD. Diet. P:   PPI. Regular diet.  HEMATOLOGIC A:   Chronic normocytic anemia. VTE prophylaxis. P:  Transfuse for Hgb < 7. SCD's / warfarin. CBC in AM.  INFECTIOUS A:   No acute issues. P:   No interventions required.  ENDOCRINE A:   Hx hypothyroidism, DM. P:   Continue home synthroid. Can likely restart preadmission metformin.  NEUROLOGIC A:   Hx CVA, seizures, vertigo / Meniere's. P:   Continue carbamazapine.   FAMILY  - Updates: Patient and family at bedside.  He will be transferred out of ICU and to [facility] service starting in AM 10/3.  - Inter-disciplinary family meet or Palliative Care meeting due by:  day [name], [name] Pager: [phone]  or [phone] 12/22/2015, 7:45 PM

## 2015-12-22 NOTE — Progress Notes (Signed)
ANTICOAGULATION CONSULT NOTE  Pharmacy Consult for warfarin Indication: Atrial fibrillation  No Known Allergies  Patient Measurements: Height: 5\' 5"  (165.1 cm) Weight: 214 lb 11.7 oz (97.4 kg) IBW/kg (Calculated) : 61.5  Vital Signs: Temp: 98.2 F (36.8 C) (10/02 0400) Temp Source: Oral (10/02 0400) BP: 108/63 (10/02 1008) Pulse Rate: 83 (10/02 1008)  Labs:  Recent Labs  12/20/15 1915 12/21/15 0235 12/21/15 1137 12/21/15 1517 12/21/15 2148 12/22/15 0346  HGB 12.8* 14.2  --   --   --  12.2*  HCT 40.8 43.9  --   --   --  38.1*  PLT 291 291  --   --   --  207  LABPROT 12.5  --   --   --   --  14.4  INR 0.93  --   --   --   --  1.12  CREATININE 1.02 1.09  --   --   --  1.09  TROPONINI  --   --  0.07* 0.07* 0.06*  --     Estimated Creatinine Clearance: 58 mL/min (by C-G formula based on SCr of 1.09 mg/dL).   Medical History: Past Medical History:  Diagnosis Date  . Atrial flutter ([place])   . BPH (benign prostatic hyperplasia)   . Coronary artery disease    a. s/p MI tx with Cypher DES to pRCA in 4/04;  b. Echocardiogram 7/09: Normal LV function.  c. Nuclear study 3/13 no ischemia;  d. ETT 2/14 neg;  e. admx with CP => LHC (01/30/2013):  pLAD 40-50%, oD1 70-80%, oOM1 40%, pRCA stent patent, mid RCA 30%, EF 60-65%. => med Rx.  . DDD (degenerative disc disease)   . Diabetes mellitus without complication ([place])   . Dyslipidemia   . Hemorrhoids   . Hx MRSA infection    left buttocks abscess  . Hx of echocardiogram    a. Echocardiogram (01/31/2013): Mild focal basal and mild concentric hypertrophy of the septum, EF 50-55%, normal wall motion, grade 1 diastolic dysfunction, trivial AI, MAC, mild LAE, PASP 35  . Hyperlipidemia   . Hypertension   . Hypothyroidism   . Meniere's disease    Status post shunt  . Myocardial infarction 2008  . Occlusion and stenosis of carotid artery without mention of cerebral infarction    40-59% on carotid doppler 2014; [place] (01/2013): R  1-39%, L 60-79%  . Rotator cuff injury    chronic rotator cuff injury status post repair  . Seizure disorder ([place])   . Stroke [facility])    a. 01/2013=> post cardiac cath CVA to L post communicating artery system; R sided weakness  . Syncope     Assessment:  82 y/o M on 12/20/2015 with SOB on Xarelto PTA for afib. Discussed drug interaction of Xarelto/carbamazepine with MD and anticoagulation changed to warfarin. Carbamazepine has potential to decrease the effectiveness of warfarin, so will follow closely. Baseline INR 0.93, now up to 1.12 after 1 dose. CBC stable. No bleed documented.  Goal of Therapy:  Goal INR 2-3 Monitor platelets by anticoagulation protocol: yes   Plan:  Warfarin 5 mg po x 1 Daily INR Monitor CBC and for S&S of bleed  Elicia Lamp, PharmD, BCPS Clinical Pharmacist 12/22/2015 10:49 AM

## 2015-12-23 ENCOUNTER — Telehealth: Payer: Self-pay | Admitting: Cardiology

## 2015-12-23 LAB — BASIC METABOLIC PANEL
Anion gap: 6 (ref 5–15)
Anion gap: 8 (ref 5–15)
BUN: 17 mg/dL (ref 6–20)
BUN: 18 mg/dL (ref 6–20)
CO2: 29 mmol/L (ref 22–32)
CO2: 27 mmol/L (ref 22–32)
Calcium: 8.7 mg/dL — ABNORMAL LOW (ref 8.9–10.3)
Calcium: 8.9 mg/dL (ref 8.9–10.3)
Chloride: 102 mmol/L (ref 101–111)
Chloride: 100 mmol/L — ABNORMAL LOW (ref 101–111)
Creatinine, Ser: 0.96 mg/dL (ref 0.61–1.24)
Creatinine, Ser: 1.18 mg/dL (ref 0.61–1.24)
GFR calc Af Amer: >60 mL/min (ref >60)
GFR calc Af Amer: >60 mL/min (ref >60)
GFR calc non Af Amer: >60 mL/min (ref >60)
GFR calc non Af Amer: 56 mL/min — ABNORMAL LOW (ref >60)
Glucose, Bld: 132 mg/dL — ABNORMAL HIGH (ref 65–99)
Glucose, Bld: 199 mg/dL — ABNORMAL HIGH (ref 65–99)
Potassium: 3.7 mmol/L (ref 3.5–5.1)
Potassium: 3.9 mmol/L (ref 3.5–5.1)
Sodium: 137 mmol/L (ref 135–145)
Sodium: 135 mmol/L (ref 135–145)

## 2015-12-23 LAB — CBC
HCT: 36.4 % — ABNORMAL LOW (ref 39.0–52.0)
Hemoglobin: 11.7 g/dL — ABNORMAL LOW (ref 13.0–17.0)
MCH: 29.5 pg (ref 26.0–34.0)
MCHC: 32.1 g/dL (ref 30.0–36.0)
MCV: 91.9 fL (ref 78.0–100.0)
Platelets: 228 10*3/uL (ref 150–400)
RBC: 3.96 MIL/uL — ABNORMAL LOW (ref 4.22–5.81)
RDW: 14.9 % (ref 11.5–15.5)
WBC: 10.5 10*3/uL (ref 4.0–10.5)

## 2015-12-23 LAB — CBC WITH DIFFERENTIAL/PLATELET
Basophils Absolute: 0 10*3/uL (ref 0.0–0.1)
Basophils Relative: 0 %
Eosinophils Absolute: 0.1 10*3/uL (ref 0.0–0.7)
Eosinophils Relative: 3 %
HCT: 38.6 % — ABNORMAL LOW (ref 39.0–52.0)
Hemoglobin: 12.2 g/dL — ABNORMAL LOW (ref 13.0–17.0)
Lymphocytes Relative: 17 %
Lymphs Abs: 2.1 10*3/uL (ref 0.7–4.0)
MCH: 29 pg (ref 26.0–34.0)
MCHC: 31.6 g/dL (ref 30.0–36.0)
MCV: 91.9 fL (ref 78.0–100.0)
Monocytes Absolute: 1 10*3/uL (ref 0.1–1.0)
Monocytes Relative: 9 %
Neutro Abs: 8.9 10*3/uL — ABNORMAL HIGH (ref 1.7–7.7)
Neutrophils Relative %: 71 %
Platelets: 252 10*3/uL (ref 150–400)
RBC: 4.2 MIL/uL — ABNORMAL LOW (ref 4.22–5.81)
RDW: 14.9 % (ref 11.5–15.5)
WBC: 12.5 10*3/uL — ABNORMAL HIGH (ref 4.0–10.5)

## 2015-12-23 LAB — MAGNESIUM: Magnesium: 1.9 mg/dL (ref 1.7–2.4)

## 2015-12-23 LAB — PROTIME-INR
INR: 1.12
Prothrombin Time: 14.4 seconds (ref 11.4–15.2)

## 2015-12-23 LAB — GLUCOSE, CAPILLARY: Glucose-Capillary: 164 mg/dL — ABNORMAL HIGH (ref 65–99)

## 2015-12-23 LAB — PHOSPHORUS: Phosphorus: 3.2 mg/dL (ref 2.5–4.6)

## 2015-12-23 MED ORDER — INSULIN ASPART 100 UNIT/ML ~~LOC~~ SOLN
0.0000 [IU] | SUBCUTANEOUS | Status: DC
  Administered 2015-12-23 – 2015-12-24 (×2): 2 [IU] via SUBCUTANEOUS
  Administered 2015-12-24 (×3): 1 [IU] via SUBCUTANEOUS

## 2015-12-23 MED ORDER — METOPROLOL TARTRATE 50 MG PO TABS
75.0000 mg | ORAL_TABLET | Freq: Two times a day (BID) | ORAL | Status: DC
  Administered 2015-12-23 – 2015-12-25 (×5): 75 mg via ORAL
  Filled 2015-12-23 (×5): qty 1

## 2015-12-23 MED ORDER — WARFARIN SODIUM 7.5 MG PO TABS
7.5000 mg | ORAL_TABLET | Freq: Once | ORAL | Status: AC
  Administered 2015-12-23: 7.5 mg via ORAL
  Filled 2015-12-23: qty 1

## 2015-12-23 NOTE — Progress Notes (Signed)
PROGRESS NOTE    [name]  [id number] DOB: 03-31-1933 DOA: 12/20/2015 PCP: Odette Fraction, MD   Brief Narrative:   82yo WM with PMHX Seizure disorder,CVA without significant residual deficits, Syncope  A- flutter, MI 2008, and CAD s/p stent, HTN, DM Type 2, , Hypothyroidism, , HLD, and Meniere's disease.   He presents to the ED with complaints of shortness of breath. He was largely in his normal state of health until about 2 hours before presentation when he experienced an episode of urinary incontinence and became very upset and agitated. His breathing then became increasingly labored and his saturations dropped. He reports that his blood pressure has been running high for the past couple of days, but more in the 0000000 systolic range. He has had some chest tightness associated with this. He reports he has been compliant with medications. His legs have felt weak, but this is not significantly different from his baseline - no lower extremity edema. He denies dysuria/frequency/urgency. No recent fever / chills. He has a chronic cough, which has been more pronounced for the past two days, but no sputum production or hemoptysis. He denies any neurologic symptoms - no numbness / weakness / tingling, no visual changes. He has intermittent diarrhea, but his family attributes this to IBS.  In the ED his BP was initially low [id number] systolic. He was put on a nitro gtt, with BP now in the 180s. He was also placed on BiPAP 18/8 @ 70% FiO2, which he seems to be tolerating okay for the moment with improvement in his sats to the mid 90s Labs are largely unremarkable. Cr. essentially at baseline, no elevation of WBC, no left shift, no electrolyte abnormalities.   Subjective: A/O 4, NAD,   Assessment & Plan:   Active Problems:   Hypertensive emergency   Pulmonary edema   Paroxysmal atrial fibrillation (HCC)   Hypothyroidism   Controlled type 2 diabetes mellitus with complication, without  long-term current use of insulin ([place])   Hypertensive emergency - BP still elevated with systolic pressures in the 160s.     Will increase metoprolol to75mg  BID, continue Losartan 50mg , Imdur 30mg , and clonidine 0.1mg  BID, and lasix 20 mg daily. If needed can increase losartan further.   CAD s/p DES to pRCA - Last cath (01/30/2013) showed pLAD 40-50%, oD1 70-80%, oOM1 40%, pRCA stent patent, mid RCA 30%, EF 60-65%. =>med Rx - Echo 11/11/15 showed LV EF of 50-55%, no wm abnormality, grade 1 DD, PA pressure of 32 mm Hg.  - Continue statin. Not on ASA due to need of anticoagulation - Troponin levels minimally elevated with flat trend most consistent with demand ischemia due to #1. No further ischemic work up planned at this time.   Carotid stenosis - Carotid doppler 12/12/15 showed 1-39% Right ICA stenosis. 40-59% Left ICA stenosis. (imrpoved)  Paroxysmal atrial flutter(CHADSVASCs score of 7.) - Maintaining sinus rhythm.  Now on coumadin due to concerns of interaction with Tegretol and Xarelto. Adjusting per pharmacy. -Currently sinus tachycardia -Cardiac medication adjustment per cardiology  Hypothyroidism,  Continue home synthroid.  DM. Type II controlled with, complications -5/4 Hemoglobin A1c = 6.6  -Sensitive SSI   CVA, seizures, vertigo / Meniere's. -Continue carbamazapine. 300 mg BID -Old CVA, negative acute infarct   DVT prophylaxis: Warfarin Code Status: Full Family Communication: None Disposition Plan: Per cardiology   Consultants:  Cardiology PCCM   Procedures/Significant Events:  9/30 CT head:-Negative acute infarct.-Stable lucencies in the left thalamus, left pons and left  cerebellar hemisphere consistent with old lacunar infarcts.  Cultures   Antimicrobials:    Devices    LINES / TUBES:      Continuous Infusions:    Objective: Vitals:   12/22/15 2316 12/23/15 0331 12/23/15 0600 12/23/15 0804  BP: (!) 117/91 (!) 170/75  (!) 160/77    Pulse: 68 76  75  Resp: (!) 31     Temp: 98.6 F (37 C) 98.6 F (37 C)  98.3 F (36.8 C)  TempSrc: Oral Oral  Oral  SpO2: 94% 92%  92%  Weight:   98.4 kg (216 lb 14.9 oz)   Height:        Intake/Output Summary (Last 24 hours) at 12/23/15 0857 Last data filed at 12/23/15 0600  Gross per 24 hour  Intake              325 ml  Output             3400 ml  Net            -3075 ml   Filed Weights   12/21/15 0235 12/22/15 0600 12/23/15 0600  Weight: 98.6 kg (217 lb 6 oz) 97.4 kg (214 lb 11.7 oz) 98.4 kg (216 lb 14.9 oz)    Examination:  General: A/O 4, NAD, No acute respiratory distress Eyes: negative scleral hemorrhage, negative anisocoria, negative icterus ENT: Negative Runny nose, negative gingival bleeding, Neck:  Negative scars, masses, torticollis, lymphadenopathy, JVD Lungs: Clear to auscultation bilaterally without wheezes or crackles Cardiovascular: Sinus tachycardia, Regular rhythm without murmur gallop or rub normal S1 and S2 Abdomen: negative abdominal pain, nondistended, positive soft, bowel sounds, no rebound, no ascites, no appreciable mass Extremities: No significant cyanosis, clubbing, or edema bilateral lower extremities Skin: Negative rashes, lesions, ulcers Psychiatric:  Negative depression, negative anxiety, negative fatigue, negative mania  Central nervous system:  Cranial nerves II through XII intact, tongue/uvula midline, all extremities muscle strength 5/5, sensation intact throughout,  negative dysarthria, negative expressive aphasia, negative receptive aphasia.  .     Data Reviewed: Care during the described time interval was provided by me .  I have reviewed this patient's available data, including medical history, events of note, physical examination, and all test results as part of my evaluation. I have personally reviewed and interpreted all radiology studies.  CBC:  Recent Labs Lab 12/20/15 1915 12/21/15 0235 12/22/15 0346 12/23/15 0227   WBC 9.1 20.8* 9.5 10.5  HGB 12.8* 14.2 12.2* 11.7*  HCT 40.8 43.9 38.1* 36.4*  MCV 92.9 92.0 92.7 91.9  PLT 291 291 207 [id number]   Basic Metabolic Panel:  Recent Labs Lab 12/20/15 1915 12/21/15 0235 12/22/15 0346 12/23/15 0227  NA 139 140 139 137  K 3.6 4.0 3.8 3.7  CL 103 102 102 102  CO2 25 27 30 29   GLUCOSE 133* 138* 133* 132*  BUN 14 17 19 17   CREATININE 1.02 1.09 1.09 0.96  CALCIUM 9.3 9.1 8.7* 8.7*  MG  --  1.5*  --  1.9  PHOS  --  2.7  --  3.2   GFR: Estimated Creatinine Clearance: 66.2 mL/min (by C-G formula based on SCr of 0.96 mg/dL). Liver Function Tests: No results for input(s): AST, ALT, ALKPHOS, BILITOT, PROT, ALBUMIN in the last 168 hours. No results for input(s): LIPASE, AMYLASE in the last 168 hours. No results for input(s): AMMONIA in the last 168 hours. Coagulation Profile:  Recent Labs Lab 12/20/15 1915 12/22/15 0346 12/23/15 0227  INR 0.93 1.12 1.12  Cardiac Enzymes:  Recent Labs Lab 12/21/15 1137 12/21/15 1517 12/21/15 2148  TROPONINI 0.07* 0.07* 0.06*   BNP (last 3 results) No results for input(s): PROBNP in the last 8760 hours. HbA1C: No results for input(s): HGBA1C in the last 72 hours. CBG: No results for input(s): GLUCAP in the last 168 hours. Lipid Profile: No results for input(s): CHOL, HDL, LDLCALC, TRIG, CHOLHDL, LDLDIRECT in the last 72 hours. Thyroid Function Tests: No results for input(s): TSH, T4TOTAL, FREET4, T3FREE, THYROIDAB in the last 72 hours. Anemia Panel: No results for input(s): VITAMINB12, FOLATE, FERRITIN, TIBC, IRON, RETICCTPCT in the last 72 hours. Urine analysis:    Component Value Date/Time   COLORURINE YELLOW 07/24/2015 1750   APPEARANCEUR CLEAR 07/24/2015 1750   LABSPEC 1.024 07/24/2015 1750   PHURINE 5.5 07/24/2015 1750   GLUCOSEU NEGATIVE 07/24/2015 1750   HGBUR NEGATIVE 07/24/2015 1750   BILIRUBINUR NEGATIVE 07/24/2015 1750   KETONESUR 15 (A) 07/24/2015 1750   PROTEINUR NEGATIVE 07/24/2015  1750   UROBILINOGEN 0.2 10/22/2013 1451   NITRITE NEGATIVE 07/24/2015 1750   LEUKOCYTESUR NEGATIVE 07/24/2015 1750   Sepsis Labs: @LABRCNTIP (procalcitonin:4,lacticidven:4)  ) Recent Results (from the past 240 hour(s))  MRSA PCR Screening     Status: None   Collection Time: 12/20/15 11:00 PM  Result Value Ref Range Status   MRSA by PCR NEGATIVE NEGATIVE Final    Comment:        The GeneXpert MRSA Assay (FDA approved for NASAL specimens only), is one component of a comprehensive MRSA colonization surveillance program. It is not intended to diagnose MRSA infection nor to guide or monitor treatment for MRSA infections.          Radiology Studies: Dg Chest Port 1 View  Result Date: 12/22/2015 CLINICAL DATA:  Pulmonary edema. EXAM: PORTABLE CHEST 1 VIEW COMPARISON:  12/20/2015. FINDINGS: Cardiomegaly. Multifocal bilateral pulmonary infiltrates and/or edema again noted. Partial interval clearing. No pleural effusion or pneumothorax. IMPRESSION: Multifocal bilateral pulmonary infiltrates and/or edema again noted. Partial interval clearing. Electronically Signed   By: [place]   On: 12/22/2015 07:07        Scheduled Meds: . atorvastatin  80 mg Oral Daily  . carbamazepine  300 mg Oral BID  . cloNIDine  0.1 mg Oral BID  . furosemide  20 mg Oral Daily  . isosorbide mononitrate  30 mg Oral Daily  . levothyroxine  75 mcg Oral QAC breakfast  . losartan  50 mg Oral BID  . metoprolol  75 mg Oral BID  . pantoprazole  40 mg Oral Daily  . Warfarin - Pharmacist Dosing Inpatient   Does not apply q1800   Continuous Infusions:    LOS: 3 days    Time spent: 40 minutes    WOODS, Geraldo Docker, MD [facility] Pager [phone]   If 7PM-7AM, please contact night-coverage [email or web address] Password [email or web address] 12/23/2015, 8:57 AM

## 2015-12-23 NOTE — Telephone Encounter (Signed)
New message      [facility]  Daughter is calling to get info on patient's hosp stay/plan of care.  She states that the pt and her mother cannot remember what the doctor is telling them and the family is wanting to know plan of care on the patient.  They cannot seem to be at the hosp when the doctors come.  Please call

## 2015-12-23 NOTE — Telephone Encounter (Signed)
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

## 2015-12-23 NOTE — Telephone Encounter (Signed)
Returned call to patient's daughter Roswell Miners.She stated she was calling to find out about her father who is currently a patient at [facility] hospital.Stated her family is all concerned and have not been able to speak to Dr.Advised I will speak to Dr.Hochrein this afternoon and call her back.

## 2015-12-23 NOTE — Telephone Encounter (Signed)
Spoke to [employer] our Engineer, maintenance (IT) and told her daughter York Ram was calling to find out information about her father.She stated Dr.Jordan rounded on pt this morning.She will have Dr.Jordan call her.

## 2015-12-23 NOTE — Progress Notes (Signed)
ANTICOAGULATION CONSULT NOTE  Pharmacy Consult for warfarin Indication: Atrial fibrillation  No Known Allergies  Patient Measurements: Height: 5\' 5"  (165.1 cm) Weight: 216 lb 14.9 oz (98.4 kg) IBW/kg (Calculated) : 61.5  Vital Signs: Temp: 98.3 F (36.8 C) (10/03 0804) Temp Source: Oral (10/03 0804) BP: 160/77 (10/03 0909) Pulse Rate: 82 (10/03 0909)  Labs:  Recent Labs  12/20/15 1915 12/21/15 0235 12/21/15 1137 12/21/15 1517 12/21/15 2148 12/22/15 0346 12/23/15 0227  HGB 12.8* 14.2  --   --   --  12.2* 11.7*  HCT 40.8 43.9  --   --   --  38.1* 36.4*  PLT 291 291  --   --   --  207 228  LABPROT 12.5  --   --   --   --  14.4 14.4  INR 0.93  --   --   --   --  1.12 1.12  CREATININE 1.02 1.09  --   --   --  1.09 0.96  TROPONINI  --   --  0.07* 0.07* 0.06*  --   --     Estimated Creatinine Clearance: 66.2 mL/min (by C-G formula based on SCr of 0.96 mg/dL).   Medical History: Past Medical History:  Diagnosis Date  . Atrial flutter ([place])   . BPH (benign prostatic hyperplasia)   . Coronary artery disease    a. s/p MI tx with Cypher DES to pRCA in 4/04;  b. Echocardiogram 7/09: Normal LV function.  c. Nuclear study 3/13 no ischemia;  d. ETT 2/14 neg;  e. admx with CP => LHC (01/30/2013):  pLAD 40-50%, oD1 70-80%, oOM1 40%, pRCA stent patent, mid RCA 30%, EF 60-65%. => med Rx.  . DDD (degenerative disc disease)   . Diabetes mellitus without complication ([place])   . Dyslipidemia   . Hemorrhoids   . Hx MRSA infection    left buttocks abscess  . Hx of echocardiogram    a. Echocardiogram (01/31/2013): Mild focal basal and mild concentric hypertrophy of the septum, EF 50-55%, normal wall motion, grade 1 diastolic dysfunction, trivial AI, MAC, mild LAE, PASP 35  . Hyperlipidemia   . Hypertension   . Hypothyroidism   . Meniere's disease    Status post shunt  . Myocardial infarction 2008  . Occlusion and stenosis of carotid artery without mention of cerebral infarction    40-59% on carotid doppler 2014; [place] (01/2013): R 1-39%, L 60-79%  . Rotator cuff injury    chronic rotator cuff injury status post repair  . Seizure disorder ([place])   . Stroke [facility])    a. 01/2013=> post cardiac cath CVA to L post communicating artery system; R sided weakness  . Syncope     Assessment: 82 y/o M on 12/20/2015 with SOB on Xarelto PTA for afib. Discussed drug interaction of Xarelto/carbamazepine with MD and anticoagulation changed to warfarin. Carbamazepine has potential to decrease the effectiveness of warfarin, so will follow closely. Baseline INR 0.93, remains subtherapeutic, stable 1.12 after 2 doses. CBC stable. No bleed documented.  Goal of Therapy:  Goal INR 2-3 Monitor platelets by anticoagulation protocol: yes   Plan:  Warfarin 7.5 mg po x 1 Daily INR Monitor CBC and for S&S of bleed  Elicia Lamp, PharmD, BCPS Clinical Pharmacist 12/23/2015 10:25 AM

## 2015-12-23 NOTE — Telephone Encounter (Signed)
New message    Pts dtr wants to know if the nurse could call in relations to her dad. No other info was given.

## 2015-12-23 NOTE — Progress Notes (Signed)
TELEMETRY: Reviewed telemetry pt in NSR: Vitals:   12/22/15 2316 12/23/15 0331 12/23/15 0600 12/23/15 0804  BP: (!) 117/91 (!) 170/75  (!) 160/77  Pulse: 68 76  75  Resp: (!) 31     Temp: 98.6 F (37 C) 98.6 F (37 C)  98.3 F (36.8 C)  TempSrc: Oral Oral  Oral  SpO2: 94% 92%  92%  Weight:   216 lb 14.9 oz (98.4 kg)   Height:        Intake/Output Summary (Last 24 hours) at 12/23/15 0839 Last data filed at 12/23/15 0600  Gross per 24 hour  Intake              325 ml  Output             3400 ml  Net            -3075 ml   Filed Weights   12/21/15 0235 12/22/15 0600 12/23/15 0600  Weight: 217 lb 6 oz (98.6 kg) 214 lb 11.7 oz (97.4 kg) 216 lb 14.9 oz (98.4 kg)    Subjective  Feels very well. No chest pain or SOB. Has not ambulated yet.  Marland Kitchen atorvastatin  80 mg Oral Daily  . carbamazepine  300 mg Oral BID  . cloNIDine  0.1 mg Oral BID  . furosemide  20 mg Oral Daily  . isosorbide mononitrate  30 mg Oral Daily  . levothyroxine  75 mcg Oral QAC breakfast  . losartan  50 mg Oral BID  . metoprolol  50 mg Oral BID  . pantoprazole  40 mg Oral Daily  . Warfarin - Pharmacist Dosing Inpatient   Does not apply q1800      LABS: Basic Metabolic Panel:  Recent Labs  12/21/15 0235 12/22/15 0346 12/23/15 0227  NA 140 139 137  K 4.0 3.8 3.7  CL 102 102 102  CO2 27 30 29   GLUCOSE 138* 133* 132*  BUN 17 19 17   CREATININE 1.09 1.09 0.96  CALCIUM 9.1 8.7* 8.7*  MG 1.5*  --  1.9  PHOS 2.7  --  3.2   Liver Function Tests: No results for input(s): AST, ALT, ALKPHOS, BILITOT, PROT, ALBUMIN in the last 72 hours. No results for input(s): LIPASE, AMYLASE in the last 72 hours. CBC:  Recent Labs  12/22/15 0346 12/23/15 0227  WBC 9.5 10.5  HGB 12.2* 11.7*  HCT 38.1* 36.4*  MCV 92.7 91.9  PLT 207 228   Cardiac Enzymes:  Recent Labs  12/21/15 1137 12/21/15 1517 12/21/15 2148  TROPONINI 0.07* 0.07* 0.06*   BNP: No results for input(s): PROBNP in the last 72  hours. D-Dimer: No results for input(s): DDIMER in the last 72 hours. Hemoglobin A1C: No results for input(s): HGBA1C in the last 72 hours. Fasting Lipid Panel: No results for input(s): CHOL, HDL, LDLCALC, TRIG, CHOLHDL, LDLDIRECT in the last 72 hours. Thyroid Function Tests: No results for input(s): TSH, T4TOTAL, T3FREE, THYROIDAB in the last 72 hours.  Invalid input(s): FREET3   Radiology/Studies:  Dg Chest Port 1 View  Result Date: 12/22/2015 CLINICAL DATA:  Pulmonary edema. EXAM: PORTABLE CHEST 1 VIEW COMPARISON:  12/20/2015. FINDINGS: Cardiomegaly. Multifocal bilateral pulmonary infiltrates and/or edema again noted. Partial interval clearing. No pleural effusion or pneumothorax. IMPRESSION: Multifocal bilateral pulmonary infiltrates and/or edema again noted. Partial interval clearing. Electronically Signed   By: Marcello Moores  Register   On: 12/22/2015 07:07   Ecg shows NSR with LVH. T wave inversion in the inferior leads.  PHYSICAL EXAM General: Well developed, well nourished, in no acute distress. Head: Normocephalic, atraumatic, sclera non-icteric, oropharynx is clear Neck: Negative for carotid bruits. JVD not elevated. No adenopathy Lungs: Clear bilaterally to auscultation without wheezes, rales, or rhonchi. Breathing is unlabored. Heart: RRR S1 S2 without murmurs, rubs, or gallops.  Abdomen: Soft, non-tender, non-distended with normoactive bowel sounds. No hepatomegaly. No rebound/guarding. No obvious abdominal masses. Msk:  Strength and tone appears normal for age. Extremities: No clubbing, cyanosis or edema.  Distal pedal pulses are 2+ and equal bilaterally. Neuro: Alert and oriented X 3. Moves all extremities spontaneously. Psych:  Responds to questions appropriately with a normal affect.  ASSESSMENT AND PLAN: 1.  Hypertensive emergency - BP still elevated with systolic pressures in the 160s.     Will increase metoprolol to 75mg  BID, continue Losartan 50mg , Imdur 30mg , and  clonidine 0.1mg  BID, and lasix 20 mg daily. If needed can increase losartan further.   2. CAD s/p DES to pRCA - Last cath (01/30/2013) showed  pLAD 40-50%, oD1 70-80%, oOM1 40%, pRCA stent patent, mid RCA 30%, EF 60-65%. => med Rx - Echo 11/11/15 showed LV EF of 50-55%, no wm abnormality, grade 1 DD, PA pressure of 32 mm Hg.  - Continue statin. Not on ASA due to need of anticoagulation - Troponin levels minimally elevated with flat trend most consistent with demand ischemia due to #1. No further ischemic work up planned at this time.   3. Carotid stenosis - Carotid doppler 12/12/15 showed 1-39% Right ICA stenosis. 40-59% Left ICA stenosis. (imrpoved)  4. Paroxysmal atrial flutter - Maintaining sinus rhythm. CHADSVASCs score of 7. Now on coumadin due to concerns of interaction with Tegretol and Xarelto. Adjusting per pharmacy.   Present on Admission: . Hypertensive emergency   Signed, Peter Martinique, [place] 12/23/2015 8:39 AM

## 2015-12-24 ENCOUNTER — Ambulatory Visit: Payer: Medicare Other | Admitting: Rehabilitative and Restorative Service Providers"

## 2015-12-24 DIAGNOSIS — I5031 Acute diastolic (congestive) heart failure: Secondary | ICD-10-CM

## 2015-12-24 LAB — GLUCOSE, CAPILLARY
Glucose-Capillary: 137 mg/dL — ABNORMAL HIGH (ref 65–99)
Glucose-Capillary: 133 mg/dL — ABNORMAL HIGH (ref 65–99)
Glucose-Capillary: 144 mg/dL — ABNORMAL HIGH (ref 65–99)
Glucose-Capillary: 151 mg/dL — ABNORMAL HIGH (ref 65–99)
Glucose-Capillary: 184 mg/dL — ABNORMAL HIGH (ref 65–99)
Glucose-Capillary: 141 mg/dL — ABNORMAL HIGH (ref 65–99)

## 2015-12-24 LAB — PROTIME-INR
INR: 1.19
Prothrombin Time: 15.1 seconds (ref 11.4–15.2)

## 2015-12-24 MED ORDER — WARFARIN SODIUM 5 MG PO TABS
9.0000 mg | ORAL_TABLET | Freq: Once | ORAL | Status: AC
  Administered 2015-12-24: 9 mg via ORAL
  Filled 2015-12-24: qty 2

## 2015-12-24 MED ORDER — INSULIN ASPART 100 UNIT/ML ~~LOC~~ SOLN
0.0000 [IU] | Freq: Three times a day (TID) | SUBCUTANEOUS | Status: DC
  Administered 2015-12-24 – 2015-12-25 (×2): 2 [IU] via SUBCUTANEOUS
  Administered 2015-12-25 – 2015-12-26 (×3): 1 [IU] via SUBCUTANEOUS

## 2015-12-24 MED ORDER — INSULIN ASPART 100 UNIT/ML ~~LOC~~ SOLN: 0.0000 [IU] | Freq: Three times a day (TID) | SUBCUTANEOUS | Status: DC

## 2015-12-24 NOTE — Progress Notes (Signed)
ANTICOAGULATION CONSULT NOTE  Pharmacy Consult for warfarin Indication: Atrial fibrillation  No Known Allergies  Patient Measurements: Height: 5\' 5"  (165.1 cm) Weight: 209 lb 14.4 oz (95.2 kg) IBW/kg (Calculated) : 61.5  Vital Signs: Temp: 98.4 F (36.9 C) (10/04 0358) Temp Source: Oral (10/04 0358) BP: 168/81 (10/04 0400) Pulse Rate: 87 (10/04 0400)  Labs:  Recent Labs  12/21/15 1137 12/21/15 1517 12/21/15 2148  12/22/15 0346 12/23/15 0227 12/23/15 1959 12/24/15 0249  HGB  --   --   --   < > 12.2* 11.7* 12.2*  --   HCT  --   --   --   --  38.1* 36.4* 38.6*  --   PLT  --   --   --   --  207 228 252  --   LABPROT  --   --   --   --  14.4 14.4  --  15.1  INR  --   --   --   --  1.12 1.12  --  1.19  CREATININE  --   --   --   --  1.09 0.96 1.18  --   TROPONINI 0.07* 0.07* 0.06*  --   --   --   --   --   < > = values in this interval not displayed.  Estimated Creatinine Clearance: 53 mL/min (by C-G formula based on SCr of 1.18 mg/dL).   Medical History: Past Medical History:  Diagnosis Date  . Atrial flutter ([place])   . BPH (benign prostatic hyperplasia)   . Coronary artery disease    a. s/p MI tx with Cypher DES to pRCA in 4/04;  b. Echocardiogram 7/09: Normal LV function.  c. Nuclear study 3/13 no ischemia;  d. ETT 2/14 neg;  e. admx with CP => LHC (01/30/2013):  pLAD 40-50%, oD1 70-80%, oOM1 40%, pRCA stent patent, mid RCA 30%, EF 60-65%. => med Rx.  . DDD (degenerative disc disease)   . Diabetes mellitus without complication ([place])   . Dyslipidemia   . Hemorrhoids   . Hx MRSA infection    left buttocks abscess  . Hx of echocardiogram    a. Echocardiogram (01/31/2013): Mild focal basal and mild concentric hypertrophy of the septum, EF 50-55%, normal wall motion, grade 1 diastolic dysfunction, trivial AI, MAC, mild LAE, PASP 35  . Hyperlipidemia   . Hypertension   . Hypothyroidism   . Meniere's disease    Status post shunt  . Myocardial infarction 2008  .  Occlusion and stenosis of carotid artery without mention of cerebral infarction    40-59% on carotid doppler 2014; [place] (01/2013): R 1-39%, L 60-79%  . Rotator cuff injury    chronic rotator cuff injury status post repair  . Seizure disorder ([place])   . Stroke [facility])    a. 01/2013=> post cardiac cath CVA to L post communicating artery system; R sided weakness  . Syncope     Assessment: 82 y/o M on 12/20/2015 with SOB on Xarelto PTA for afib. Discussed drug interaction of Xarelto/carbamazepine with MD and anticoagulation changed to warfarin. Carbamazepine has potential to decrease the effectiveness of warfarin, so will follow closely. Baseline INR 0.93, remains subtherapeutic, up very slightly to 1.19 after 3 doses. CBC stable. No bleed documented.  Goal of Therapy:  Goal INR 2-3 Monitor platelets by anticoagulation protocol: yes   Plan:  Warfarin 9 mg po x 1 Daily INR Monitor CBC and for S&S of bleed  Elicia Lamp, PharmD,  BCPS Clinical Pharmacist 12/24/2015 8:12 AM

## 2015-12-24 NOTE — Progress Notes (Signed)
TELEMETRY: Reviewed telemetry pt in NSR: Vitals:   12/24/15 0000 12/24/15 0053 12/24/15 0358 12/24/15 0400  BP: (!) 166/91 (!) 166/91 (!) 156/78 (!) 168/81  Pulse: 86 87  87  Resp:  16 16   Temp:  98.2 F (36.8 C) 98.4 F (36.9 C)   TempSrc:  Oral Oral   SpO2: 91% 91% 92% 97%  Weight:   209 lb 14.4 oz (95.2 kg)   Height:        Intake/Output Summary (Last 24 hours) at 12/24/15 0731 Last data filed at 12/24/15 0300  Gross per 24 hour  Intake              565 ml  Output             1550 ml  Net             -985 ml   Filed Weights   12/22/15 0600 12/23/15 0600 12/24/15 0358  Weight: 214 lb 11.7 oz (97.4 kg) 216 lb 14.9 oz (98.4 kg) 209 lb 14.4 oz (95.2 kg)    Subjective  Feels very well. No chest pain or SOB. Has been up in room.  Marland Kitchen atorvastatin  80 mg Oral Daily  . carbamazepine  300 mg Oral BID  . cloNIDine  0.1 mg Oral BID  . furosemide  20 mg Oral Daily  . insulin aspart  0-9 Units Subcutaneous Q4H  . isosorbide mononitrate  30 mg Oral Daily  . levothyroxine  75 mcg Oral QAC breakfast  . losartan  50 mg Oral BID  . metoprolol  75 mg Oral BID  . pantoprazole  40 mg Oral Daily  . Warfarin - Pharmacist Dosing Inpatient   Does not apply q1800      LABS: Basic Metabolic Panel:  Recent Labs  12/23/15 0227 12/23/15 1959  NA 137 135  K 3.7 3.9  CL 102 100*  CO2 29 27  GLUCOSE 132* 199*  BUN 17 18  CREATININE 0.96 1.18  CALCIUM 8.7* 8.9  MG 1.9  --   PHOS 3.2  --    Liver Function Tests: No results for input(s): AST, ALT, ALKPHOS, BILITOT, PROT, ALBUMIN in the last 72 hours. No results for input(s): LIPASE, AMYLASE in the last 72 hours. CBC:  Recent Labs  12/23/15 0227 12/23/15 1959  WBC 10.5 12.5*  NEUTROABS  --  8.9*  HGB 11.7* 12.2*  HCT 36.4* 38.6*  MCV 91.9 91.9  PLT 228 252   Cardiac Enzymes:  Recent Labs  12/21/15 1137 12/21/15 1517 12/21/15 2148  TROPONINI 0.07* 0.07* 0.06*   BNP: No results for input(s): PROBNP in the last  72 hours. D-Dimer: No results for input(s): DDIMER in the last 72 hours. Hemoglobin A1C: No results for input(s): HGBA1C in the last 72 hours. Fasting Lipid Panel: No results for input(s): CHOL, HDL, LDLCALC, TRIG, CHOLHDL, LDLDIRECT in the last 72 hours. Thyroid Function Tests: No results for input(s): TSH, T4TOTAL, T3FREE, THYROIDAB in the last 72 hours.  Invalid input(s): FREET3   Radiology/Studies:  No results found. Ecg shows NSR with LVH. T wave inversion in the inferior leads.  PHYSICAL EXAM General: Well developed, well nourished, in no acute distress. Head: Normocephalic, atraumatic, sclera non-icteric, oropharynx is clear Neck: Negative for carotid bruits. JVD not elevated. No adenopathy Lungs: Clear bilaterally to auscultation without wheezes, rales, or rhonchi. Breathing is unlabored. Heart: RRR S1 S2 without murmurs, rubs, or gallops.  Abdomen: Soft, non-tender, non-distended with normoactive bowel sounds. No  hepatomegaly. No rebound/guarding. No obvious abdominal masses. Msk:  Strength and tone appears normal for age. Extremities: No clubbing, cyanosis or edema.  Distal pedal pulses are 2+ and equal bilaterally. Neuro: Alert and oriented X 3. Moves all extremities spontaneously. Psych:  Responds to questions appropriately with a normal affect.  ASSESSMENT AND PLAN: 1.  Hypertensive emergency - BP still labile with some elevated readings and some low.    Now on metoprolol to 75mg  BID, continue Losartan 50mg , Imdur 30mg , and clonidine 0.1mg  BID, and lasix 20 mg daily. If needed can increase losartan or metoprolol further but with multiple recent changes in meds I would hold the course for now.  Etiology of BP increase is unclear. Recent CT abdomen negative for adrenal tumor. Would consider renal artery duplex as outpatient. Ambulate in halls.  2. CAD s/p DES to pRCA - Last cath (01/30/2013) showed  pLAD 40-50%, oD1 70-80%, oOM1 40%, pRCA stent patent, mid RCA 30%, EF  60-65%. => med Rx - Echo 11/11/15 showed LV EF of 50-55%, no wm abnormality, grade 1 DD, PA pressure of 32 mm Hg.  - Continue statin. Not on ASA due to need of anticoagulation - Troponin levels minimally elevated with flat trend most consistent with demand ischemia due to #1. No further ischemic work up planned at this time.   3. Carotid stenosis - Carotid doppler 12/12/15 showed 1-39% Right ICA stenosis. 40-59% Left ICA stenosis. (imrpoved)  4. Paroxysmal atrial flutter - Maintaining sinus rhythm. CHADSVASCs score of 7. Now on coumadin due to concerns of interaction with Tegretol and Xarelto. Adjusting per pharmacy. INR still low 1.19.   5. Acute diastolic CHF secondary to #1. Patient has diuresed well. Weight down 8 lbs. I/O negative 4.6 liters. No evidence of volume overload now.   Present on Admission: . Hypertensive emergency   Signed, Peter Martinique, [place] 12/24/2015 7:31 AM

## 2015-12-24 NOTE — Progress Notes (Signed)
[place] TEAM 1 - Stepdown/ICU ANDERSSON SCHLOTTER Aylward  [id number] DOB: 1933-05-08 DOA: 12/20/2015 PCP: Odette Fraction, MD    Brief Narrative:  82yo M with hx Seizure disorder, CVA without residual deficits, Syncope,  A- flutter, MI 2008, CAD s/p stent, HTN, DM2, Hypothyroidism, HLD, and Meniere's disease who presented to the ED with complaints of shortness of breath. He experienced an episode of urinary incontinence and became very upset and agitated. His breathing then became increasingly labored and his saturations dropped.   In the ED his BP was in the 123456 systolic. He was put on a nitro gtt. He was placed on BiPAP 18/8 @ 70% FiO2.  Subjective: The patient is resting comfortably.  He denies chest pain shortness breath fevers chills nausea or vomiting.  Assessment & Plan:  Hypertensive emergency w/ acute diastolic CHF  -BP now much better controlled, though labile - follow w/o change in meds today   CAD s/p DES to pRCA As per Cardiology: -Last cath (01/30/2013) showed pLAD 40-50%, oD1 70-80%, oOM1 40%, pRCA stent patent, mid RCA 30%, EF 60-65%. =>med Rx -Echo 11/11/15 showed LV EF of 50-55%, no wm abnormality, grade 1 DD, PA pressure of 32 mm Hg.  -Continue statin. Not on ASA due to need of anticoagulation -Troponin levels minimally elevated with flat trend most consistent with demand ischemia due to #1. No further ischemic work up planned at this time.   Paroxysmal atrial flutter -CHADSVASCs score is 7 -on coumadin due to concerns of interaction with Tegretol and Xarelto - pharmacy dosing - INR not yet at goal   Hypothyroidism Continue home synthroid.  DM. Type II controlled with, complications -5/4 123456 6.6 - CBG reasonably controlled   Seizure d/o -cont home meds  DVT prophylaxis: warfarin  Code Status: FULL CODE Family Communication: no family present at time of exam  Disposition Plan: transfer to tele bed - ambulate - follow INR - follow BP   Consultants:   [facility]  PCCM  Procedures: none  Antimicrobials:  none   Objective: Blood pressure 133/81, pulse 75, temperature 97.6 F (36.4 C), temperature source Oral, resp. rate 20, height 5\' 5"  (1.651 m), weight 95.2 kg (209 lb 14.4 oz), SpO2 100 %.  Intake/Output Summary (Last 24 hours) at 12/24/15 1626 Last data filed at 12/24/15 1300  Gross per 24 hour  Intake              960 ml  Output             1550 ml  Net             -590 ml   Filed Weights   12/22/15 0600 12/23/15 0600 12/24/15 0358  Weight: 97.4 kg (214 lb 11.7 oz) 98.4 kg (216 lb 14.9 oz) 95.2 kg (209 lb 14.4 oz)    Examination: General: No acute respiratory distress Lungs: Clear to auscultation bilaterally without wheezes or crackles Cardiovascular: Regular rate without murmur gallop or rub normal S1 and S2 Abdomen: Nontender, nondistended, soft, bowel sounds positive, no rebound, no ascites, no appreciable mass Extremities: No significant cyanosis, clubbing, or edema bilateral lower extremities  CBC:  Recent Labs Lab 12/20/15 1915 12/21/15 0235 12/22/15 0346 12/23/15 0227 12/23/15 1959  WBC 9.1 20.8* 9.5 10.5 12.5*  NEUTROABS  --   --   --   --  8.9*  HGB 12.8* 14.2 12.2* 11.7* 12.2*  HCT 40.8 43.9 38.1* 36.4* 38.6*  MCV 92.9 92.0 92.7 91.9 91.9  PLT 291  291 207 228 [id number]   Basic Metabolic Panel:  Recent Labs Lab 12/20/15 1915 12/21/15 0235 12/22/15 0346 12/23/15 0227 12/23/15 1959  NA 139 140 139 137 135  K 3.6 4.0 3.8 3.7 3.9  CL 103 102 102 102 100*  CO2 25 27 30 29 27   GLUCOSE 133* 138* 133* 132* 199*  BUN 14 17 19 17 18   CREATININE 1.02 1.09 1.09 0.96 1.18  CALCIUM 9.3 9.1 8.7* 8.7* 8.9  MG  --  1.5*  --  1.9  --   PHOS  --  2.7  --  3.2  --    GFR: Estimated Creatinine Clearance: 53 mL/min (by C-G formula based on SCr of 1.18 mg/dL).  Liver Function Tests: No results for input(s): AST, ALT, ALKPHOS, BILITOT, PROT, ALBUMIN in the last 168 hours. No results for input(s):  LIPASE, AMYLASE in the last 168 hours. No results for input(s): AMMONIA in the last 168 hours.  Coagulation Profile:  Recent Labs Lab 12/20/15 1915 12/22/15 0346 12/23/15 0227 12/24/15 0249  INR 0.93 1.12 1.12 1.19    Cardiac Enzymes:  Recent Labs Lab 12/21/15 1137 12/21/15 1517 12/21/15 2148  TROPONINI 0.07* 0.07* 0.06*    HbA1C: Hgb A1c MFr Bld  Date/Time Value Ref Range Status  07/24/2015 09:02 AM 6.6 (H) <5.7 % Final    Comment:      For someone without known diabetes, a hemoglobin A1c value of 6.5% or greater indicates that they may have diabetes and this should be confirmed with a follow-up test.   For someone with known diabetes, a value <7% indicates that their diabetes is well controlled and a value greater than or equal to 7% indicates suboptimal control. A1c targets should be individualized based on duration of diabetes, age, comorbid conditions, and other considerations.   Currently, no consensus exists for use of hemoglobin A1c for diagnosis of diabetes for children.     11/15/2014 08:48 AM 6.6 (H) <5.7 % Final    Comment:                                                                           According to the ADA Clinical Practice Recommendations for 2011, when HbA1c is used as a screening test:     >=6.5%   Diagnostic of Diabetes Mellitus            (if abnormal result is confirmed)   5.7-6.4%   Increased risk of developing Diabetes Mellitus   References:Diagnosis and Classification of Diabetes Mellitus,Diabetes [id number] 1):S62-S69 and Standards of Medical Care in         Diabetes - 2011,Diabetes [id number] (Suppl 1):S11-S61.       CBG:  Recent Labs Lab 12/24/15 0112 12/24/15 0355 12/24/15 0835 12/24/15 1216 12/24/15 1618  GLUCAP 137* 133* 144* 151* 184*    Recent Results (from the past 240 hour(s))  MRSA PCR Screening     Status: None   Collection Time: 12/20/15 11:00 PM  Result Value Ref Range Status   MRSA  by PCR NEGATIVE NEGATIVE Final    Comment:        The GeneXpert MRSA Assay (FDA approved for NASAL specimens only), is one component of a comprehensive MRSA colonization  surveillance program. It is not intended to diagnose MRSA infection nor to guide or monitor treatment for MRSA infections.      Scheduled Meds: . atorvastatin  80 mg Oral Daily  . carbamazepine  300 mg Oral BID  . cloNIDine  0.1 mg Oral BID  . furosemide  20 mg Oral Daily  . insulin aspart  0-9 Units Subcutaneous Q4H  . isosorbide mononitrate  30 mg Oral Daily  . levothyroxine  75 mcg Oral QAC breakfast  . losartan  50 mg Oral BID  . metoprolol  75 mg Oral BID  . pantoprazole  40 mg Oral Daily  . warfarin  9 mg Oral ONCE-1800  . Warfarin - Pharmacist Dosing Inpatient   Does not apply q1800     LOS: 4 days   Cherene Altes, MD [facility] Office  [phone] Pager - Text Page per Amion as per below:  On-Call/Text Page:      [email or web address]      password [email or web address]  If 7PM-7AM, please contact night-coverage [email or web address] Password [email or web address] 12/24/2015, 4:26 PM

## 2015-12-25 LAB — GLUCOSE, CAPILLARY
Glucose-Capillary: 144 mg/dL — ABNORMAL HIGH (ref 65–99)
Glucose-Capillary: 141 mg/dL — ABNORMAL HIGH (ref 65–99)
Glucose-Capillary: 152 mg/dL — ABNORMAL HIGH (ref 65–99)
Glucose-Capillary: 142 mg/dL — ABNORMAL HIGH (ref 65–99)

## 2015-12-25 LAB — PROTIME-INR
INR: 1.47
Prothrombin Time: 18 seconds — ABNORMAL HIGH (ref 11.4–15.2)

## 2015-12-25 MED ORDER — POLYETHYLENE GLYCOL 3350 17 G PO PACK
17.0000 g | PACK | Freq: Every day | ORAL | Status: DC | PRN
  Administered 2015-12-25: 17 g via ORAL
  Filled 2015-12-25: qty 1

## 2015-12-25 MED ORDER — METOPROLOL TARTRATE 100 MG PO TABS
100.0000 mg | ORAL_TABLET | Freq: Two times a day (BID) | ORAL | Status: DC
  Administered 2015-12-25 – 2015-12-26 (×2): 100 mg via ORAL
  Filled 2015-12-25 (×2): qty 1

## 2015-12-25 MED ORDER — WARFARIN SODIUM 7.5 MG PO TABS
7.5000 mg | ORAL_TABLET | Freq: Once | ORAL | Status: AC
  Administered 2015-12-25: 7.5 mg via ORAL
  Filled 2015-12-25: qty 1

## 2015-12-25 NOTE — Progress Notes (Signed)
TELEMETRY: Reviewed telemetry pt in NSR: Vitals:   12/24/15 2128 12/25/15 0501 12/25/15 0939 12/25/15 1021  BP: (!) 178/83 (!) 162/83 (!) 156/76   Pulse: 79 83  83  Resp:  (!) 21    Temp: 98.8 F (37.1 C) 98.5 F (36.9 C)    TempSrc: Oral Oral    SpO2: 95% 93%    Weight:  209 lb 8 oz (95 kg)    Height:        Intake/Output Summary (Last 24 hours) at 12/25/15 1032 Last data filed at 12/24/15 1800  Gross per 24 hour  Intake              720 ml  Output                0 ml  Net              720 ml   Filed Weights   12/23/15 0600 12/24/15 0358 12/25/15 0501  Weight: 216 lb 14.9 oz (98.4 kg) 209 lb 14.4 oz (95.2 kg) 209 lb 8 oz (95 kg)    Subjective  Feels very well. No chest pain or SOB. Has been ambulating in room but not in halls.  Marland Kitchen atorvastatin  80 mg Oral Daily  . carbamazepine  300 mg Oral BID  . cloNIDine  0.1 mg Oral BID  . furosemide  20 mg Oral Daily  . insulin aspart  0-9 Units Subcutaneous TID WC  . isosorbide mononitrate  30 mg Oral Daily  . levothyroxine  75 mcg Oral QAC breakfast  . losartan  50 mg Oral BID  . metoprolol  75 mg Oral BID  . pantoprazole  40 mg Oral Daily  . warfarin  7.5 mg Oral ONCE-1800  . Warfarin - Pharmacist Dosing Inpatient   Does not apply q1800      LABS: Basic Metabolic Panel:  Recent Labs  12/23/15 0227 12/23/15 1959  NA 137 135  K 3.7 3.9  CL 102 100*  CO2 29 27  GLUCOSE 132* 199*  BUN 17 18  CREATININE 0.96 1.18  CALCIUM 8.7* 8.9  MG 1.9  --   PHOS 3.2  --    Liver Function Tests: No results for input(s): AST, ALT, ALKPHOS, BILITOT, PROT, ALBUMIN in the last 72 hours. No results for input(s): LIPASE, AMYLASE in the last 72 hours. CBC:  Recent Labs  12/23/15 0227 12/23/15 1959  WBC 10.5 12.5*  NEUTROABS  --  8.9*  HGB 11.7* 12.2*  HCT 36.4* 38.6*  MCV 91.9 91.9  PLT 228 252   Cardiac Enzymes: No results for input(s): CKTOTAL, CKMB, CKMBINDEX, TROPONINI in the last 72 hours. BNP: No results for  input(s): PROBNP in the last 72 hours. D-Dimer: No results for input(s): DDIMER in the last 72 hours. Hemoglobin A1C: No results for input(s): HGBA1C in the last 72 hours. Fasting Lipid Panel: No results for input(s): CHOL, HDL, LDLCALC, TRIG, CHOLHDL, LDLDIRECT in the last 72 hours. Thyroid Function Tests: No results for input(s): TSH, T4TOTAL, T3FREE, THYROIDAB in the last 72 hours.  Invalid input(s): FREET3   Radiology/Studies:  No results found. Ecg shows NSR with LVH. T wave inversion in the inferior leads.  PHYSICAL EXAM General: Well developed, well nourished, in no acute distress. Head: Normocephalic, atraumatic, sclera non-icteric, oropharynx is clear Neck: Negative for carotid bruits. JVD not elevated. No adenopathy Lungs: Clear bilaterally to auscultation without wheezes, rales, or rhonchi. Breathing is unlabored. Heart: RRR S1 S2 without murmurs,  rubs, or gallops.  Abdomen: Soft, non-tender, non-distended with normoactive bowel sounds. No hepatomegaly. No rebound/guarding. No obvious abdominal masses. Msk:  Strength and tone appears normal for age. Extremities: No clubbing, cyanosis or edema.  Distal pedal pulses are 2+ and equal bilaterally. Neuro: Alert and oriented X 3. Moves all extremities spontaneously. Psych:  Responds to questions appropriately with a normal affect.  ASSESSMENT AND PLAN: 1.  Hypertensive emergency - BP still elevated but improved. Will increase metoprolol to100 mg BID, continue Losartan 50mg , Imdur 30mg , and clonidine 0.1mg  BID, and lasix 20 mg daily.  Etiology of BP increase is unclear. Recent CT abdomen negative for adrenal tumor. Would consider renal artery duplex as outpatient.   2. CAD s/p DES to pRCA - Last cath (01/30/2013) showed  pLAD 40-50%, oD1 70-80%, oOM1 40%, pRCA stent patent, mid RCA 30%, EF 60-65%. => med Rx - Echo 11/11/15 showed LV EF of 50-55%, no wm abnormality, grade 1 DD, PA pressure of 32 mm Hg.  - Continue statin. Not  on ASA due to need of anticoagulation - Troponin levels minimally elevated with flat trend most consistent with demand ischemia due to #1. No further ischemic work up planned at this time.   3. Carotid stenosis - Carotid doppler 12/12/15 showed 1-39% Right ICA stenosis. 40-59% Left ICA stenosis. (imrpoved)  4. Paroxysmal atrial flutter - Maintaining sinus rhythm. CHADSVASCs score of 7. Now on coumadin due to concerns of interaction with Tegretol and Xarelto. Adjusting per pharmacy. INR still low 1.19.   5. Acute diastolic CHF secondary to #1. Patient has diuresed well. Weight down 8 lbs. I/O negative 4.6 liters. No evidence of volume overload now.  Patient is stable for DC from a cardiac standpoint. Further plans per primary team. Will need follow up with Dr. Percival Spanish.   Present on Admission: . Hypertensive emergency . Acute diastolic CHF (congestive heart failure) ([place])   Signed, Peter Martinique, [place] 12/25/2015 10:32 AM

## 2015-12-25 NOTE — Care Management Important Message (Signed)
Important Message  Patient Details  Name: JASON MARSHALL MRN: [id number] Date of Birth: [birth year]   Medicare Important Message Given:  Yes    Shealy, Nia Abena 12/25/2015, 9:58 AM

## 2015-12-25 NOTE — Progress Notes (Addendum)
PROGRESS NOTE  [name] [id number] DOB: [birth year] DOA: 12/20/2015 PCP: Odette Fraction, MD  HPI/Recap of past 24 hours: Patient is an 82 year old male past medical history of diabetes mellitus, CAD and a flutter who presented to the emergency room on 9/30 with complaints of sudden onset shortness of breath times less than 1 day which progressively became worse and worse. He was noted to have a blood pressure in the [id number] systolic and noted to be in acute respiratory failure requiring BiPAP. Patient was found to have flash pulmonary edema and admitted to the critical-care service. He was placed on a nitroglycerin drip and aggressively diuresed. Over the next few days, he was able to have significant improvement and transitioned over to the stepdown unit under the hospitalist service. Cardiology was consulted for further evaluation of reasons for his sudden onset CHF and hypertensive spike. CT looking for adrenal tumor was unrevealing. Remaining test is for a renal artery duplex ultrasound  Today, patient self is feeling good. Denies any palpitations or chest pain or trouble breathing. Eager to get home soon.  Assessment/Plan: Active Problems:   Hypertensive emergency with acute diastolic CHF: Blood pressure now much better controlled although still somewhat labile.   CAD status post stent placement: Last cardiac catheterization in 2014. Continue statin. Not on aspirin due to being on anticoagulation. Troponin levels were minimally elevated with flat trend consistent with demand ischemia secondary to hypertensive urgency and CHF. No further ischemic workup planned. Cardiology following.    Paroxysmal atrial fibrillation [facility]): Chads 2 score of 7. Due to concerns of interaction with Tegretol and xarelto, patient changed over to Coumadin. Pharmacy has been dosing. INR has been trending upward and today at 1.47    Hypothyroidism: Continue Synthroid.    Controlled type 2 diabetes  mellitus with complication, without long-term current use of insulin ([place]): Blood sugars under 180. Stable. Continue home medications. A1c at 6.6    Acute respiratory failure with hypoxia: Secondary to CHF and pulmonary edema. Resolved.   Code Status: Full code   Family Communication: Wife at the bedside   Disposition Plan: If renal artery duplex ultrasound unrevealing and INR therapeutic, likely can discharge home tomorrow with home health    Consultants:  Cardiology  critical care   Procedures:  None   Antimicrobials:  None  DVT prophylaxis: Coumadin   Objective: Vitals:   12/24/15 2128 12/25/15 0501 12/25/15 0939 12/25/15 1021  BP: (!) 178/83 (!) 162/83 (!) 156/76   Pulse: 79 83  83  Resp:  (!) 21    Temp: 98.8 F (37.1 C) 98.5 F (36.9 C)    TempSrc: Oral Oral    SpO2: 95% 93%    Weight:  95 kg (209 lb 8 oz)    Height:        Intake/Output Summary (Last 24 hours) at 12/25/15 1544 Last data filed at 12/24/15 1800  Gross per 24 hour  Intake              360 ml  Output                0 ml  Net              360 ml   Filed Weights   12/23/15 0600 12/24/15 0358 12/25/15 0501  Weight: 98.4 kg (216 lb 14.9 oz) 95.2 kg (209 lb 14.4 oz) 95 kg (209 lb 8 oz)    Exam:   General: Alert and oriented 3  Cardiovascular: Irregular  rhythm, rate controlled   Respiratory: Clear to auscultation bilaterally   Abdomen: Soft, nontender, nondistended, positive bowel sounds   Musculoskeletal: No clubbing or cyanosis, trace edema   Skin: No skin breaks, tears or lesions  Psychiatry: Patient is appropriate, no evidence of psychoses    Data Reviewed: CBC:  Recent Labs Lab 12/20/15 1915 12/21/15 0235 12/22/15 0346 12/23/15 0227 12/23/15 1959  WBC 9.1 20.8* 9.5 10.5 12.5*  NEUTROABS  --   --   --   --  8.9*  HGB 12.8* 14.2 12.2* 11.7* 12.2*  HCT 40.8 43.9 38.1* 36.4* 38.6*  MCV 92.9 92.0 92.7 91.9 91.9  PLT 291 291 207 228 [id number]   Basic Metabolic  Panel:  Recent Labs Lab 12/20/15 1915 12/21/15 0235 12/22/15 0346 12/23/15 0227 12/23/15 1959  NA 139 140 139 137 135  K 3.6 4.0 3.8 3.7 3.9  CL 103 102 102 102 100*  CO2 25 27 30 29 27   GLUCOSE 133* 138* 133* 132* 199*  BUN 14 17 19 17 18   CREATININE 1.02 1.09 1.09 0.96 1.18  CALCIUM 9.3 9.1 8.7* 8.7* 8.9  MG  --  1.5*  --  1.9  --   PHOS  --  2.7  --  3.2  --    GFR: Estimated Creatinine Clearance: 52.9 mL/min (by C-G formula based on SCr of 1.18 mg/dL). Liver Function Tests: No results for input(s): AST, ALT, ALKPHOS, BILITOT, PROT, ALBUMIN in the last 168 hours. No results for input(s): LIPASE, AMYLASE in the last 168 hours. No results for input(s): AMMONIA in the last 168 hours. Coagulation Profile:  Recent Labs Lab 12/20/15 1915 12/22/15 0346 12/23/15 0227 12/24/15 0249 12/25/15 0455  INR 0.93 1.12 1.12 1.19 1.47   Cardiac Enzymes:  Recent Labs Lab 12/21/15 1137 12/21/15 1517 12/21/15 2148  TROPONINI 0.07* 0.07* 0.06*   BNP (last 3 results) No results for input(s): PROBNP in the last 8760 hours. HbA1C: No results for input(s): HGBA1C in the last 72 hours. CBG:  Recent Labs Lab 12/24/15 1216 12/24/15 1618 12/24/15 2118 12/25/15 0725 12/25/15 1131  GLUCAP 151* 184* 141* 144* 141*   Lipid Profile: No results for input(s): CHOL, HDL, LDLCALC, TRIG, CHOLHDL, LDLDIRECT in the last 72 hours. Thyroid Function Tests: No results for input(s): TSH, T4TOTAL, FREET4, T3FREE, THYROIDAB in the last 72 hours. Anemia Panel: No results for input(s): VITAMINB12, FOLATE, FERRITIN, TIBC, IRON, RETICCTPCT in the last 72 hours. Urine analysis:    Component Value Date/Time   COLORURINE YELLOW 07/24/2015 1750   APPEARANCEUR CLEAR 07/24/2015 1750   LABSPEC 1.024 07/24/2015 1750   PHURINE 5.5 07/24/2015 1750   GLUCOSEU NEGATIVE 07/24/2015 1750   HGBUR NEGATIVE 07/24/2015 1750   BILIRUBINUR NEGATIVE 07/24/2015 1750   KETONESUR 15 (A) 07/24/2015 1750   PROTEINUR  NEGATIVE 07/24/2015 1750   UROBILINOGEN 0.2 10/22/2013 1451   NITRITE NEGATIVE 07/24/2015 1750   LEUKOCYTESUR NEGATIVE 07/24/2015 1750   Sepsis Labs: @LABRCNTIP (procalcitonin:4,lacticidven:4)  ) Recent Results (from the past 240 hour(s))  MRSA PCR Screening     Status: None   Collection Time: 12/20/15 11:00 PM  Result Value Ref Range Status   MRSA by PCR NEGATIVE NEGATIVE Final    Comment:        The GeneXpert MRSA Assay (FDA approved for NASAL specimens only), is one component of a comprehensive MRSA colonization surveillance program. It is not intended to diagnose MRSA infection nor to guide or monitor treatment for MRSA infections.       Studies:  No results found.  Scheduled Meds: . atorvastatin  80 mg Oral Daily  . carbamazepine  300 mg Oral BID  . cloNIDine  0.1 mg Oral BID  . furosemide  20 mg Oral Daily  . insulin aspart  0-9 Units Subcutaneous TID WC  . isosorbide mononitrate  30 mg Oral Daily  . levothyroxine  75 mcg Oral QAC breakfast  . losartan  50 mg Oral BID  . metoprolol  100 mg Oral BID  . pantoprazole  40 mg Oral Daily  . warfarin  7.5 mg Oral ONCE-1800  . Warfarin - Pharmacist Dosing Inpatient   Does not apply q1800    Continuous Infusions:    LOS: 5 days    Annita Brod, MD [facility] Pager [phone]  If 7PM-7AM, please contact night-coverage [email or web address] Password [email or web address] 12/25/2015, 3:44 PM

## 2015-12-25 NOTE — Evaluation (Signed)
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  PMHx:  CVA, MI, CAD, DM, a-flutter, stents,   Clinical Impression  Pt is demonstrating some good tolerance for mobility today, with resting pulses in 80 range then up to 101 briefly and at 81 once sitting. Has been able to keep O2 sats in 95-98% range for all gait and transfers.  Will anticipate his transition to HHPT to be good with acute therapy assisting with strengthening and monitoring vitals for gait.    Follow Up Recommendations Home health PT;Supervision for mobility/OOB    Equipment Recommendations  Rolling walker with 5" wheels    Recommendations for Other Services Rehab consult     Precautions / Restrictions Precautions Precautions: Fall (telemetry and O2 sat monitoring) Restrictions Weight Bearing Restrictions: No      Mobility  Bed Mobility               General bed mobility comments: up in chair when PT entered room  Transfers Overall transfer level: Needs assistance Equipment used: Rolling walker (2 wheeled);1 person hand held assist Transfers: Sit to/from [facility] Sit to Stand: Mod assist Stand pivot transfers: Min guard;Min assist       General transfer comment: cues to power up with hands and PT lifting, weak  Ambulation/Gait Ambulation/Gait assistance: Min assist;Min guard Ambulation Distance (Feet): 200 Feet Assistive device: Rolling walker (2 wheeled);1 person hand held assist Gait Pattern/deviations: Step-through pattern;Narrow base of support;Decreased stride length (controlled pace) Gait velocity: reduced Gait velocity interpretation: Below normal speed for age/gender    Stairs            Wheelchair  Mobility    Modified Rankin (Stroke Patients Only)       Balance Overall balance assessment: Needs assistance Sitting-balance support: Feet supported Sitting balance-Leahy Scale: Good   Postural control: Posterior lean Standing balance support: Bilateral upper extremity supported Standing balance-Leahy Scale: Fair Standing balance comment: less than fair standing dynamically                             Pertinent Vitals/Pain Pain Assessment: No/denies pain    Home Living Family/patient expects to be discharged to:: Private residence Living Arrangements: Spouse/significant other;Children Available Help at Discharge: Family;Available 24 hours/day Type of Home: House Home Access: Stairs to enter Entrance Stairs-Rails: None Entrance Stairs-Number of Steps: 1 Home Layout: One level Home Equipment: None      Prior Function Level of Independence: Independent (working as a Company secretary)               Journalist, newspaper        Extremity/Trunk Assessment   Upper Extremity Assessment: Overall WFL for tasks assessed           Lower Extremity Assessment: Generalized weakness      Cervical / Trunk Assessment: Kyphotic  Communication   Communication: No difficulties  Cognition Arousal/Alertness: Awake/alert Behavior During Therapy: WFL for tasks assessed/performed Overall Cognitive Status: Within Functional Limits for tasks assessed                      General Comments General comments (skin integrity, edema, etc.): Pt is demonstrating good effort and definitely needs to continue  with walker and HHPT despite his relative independence, and will encourage him to continue to outpatient after this.    Exercises     Assessment/Plan    PT Assessment Patient needs continued PT services  PT Problem List Decreased strength;Decreased range of motion;Decreased activity tolerance;Decreased balance;Decreased mobility;Decreased coordination;Decreased safety  awareness;Decreased knowledge of use of DME;Cardiopulmonary status limiting activity          PT Treatment Interventions DME instruction;Gait training;Stair training;Functional mobility training;Therapeutic activities;Therapeutic exercise;Balance training;Neuromuscular re-education;Patient/family education    PT Goals (Current goals can be found in the Care Plan section)  Acute Rehab PT Goals Patient Stated Goal: to get home PT Goal Formulation: With patient/family Time For Goal Achievement: 01/08/16 Potential to Achieve Goals: Good    Frequency Min 3X/week   Barriers to discharge Inaccessible home environment step will require AD    Co-evaluation               End of Session Equipment Utilized During Treatment: Gait belt Activity Tolerance: Patient tolerated treatment well Patient left: in chair;with call bell/phone within reach;with chair alarm set;with family/visitor present Nurse Communication: Mobility status         Time: [id number] PT Time Calculation (min) (ACUTE ONLY): 25 min   Charges:   PT Evaluation $PT Eval Moderate Complexity: 1 Procedure PT Treatments $Gait Training: 8-22 mins   PT G CodesRamond Dial 2016-01-07, 12:39 PM    Mee Hives, PT MS Acute Rehab Dept. Number: [place] and [place]

## 2015-12-25 NOTE — Progress Notes (Signed)
ANTICOAGULATION CONSULT NOTE  Pharmacy Consult for warfarin Indication: Atrial fibrillation  No Known Allergies  Patient Measurements: Height: 5\' 5"  (165.1 cm) Weight: 209 lb 8 oz (95 kg) IBW/kg (Calculated) : 61.5  Vital Signs: Temp: 98.5 F (36.9 C) (10/05 0501) Temp Source: Oral (10/05 0501) BP: 162/83 (10/05 0501) Pulse Rate: 83 (10/05 0501)  Labs:  Recent Labs  12/23/15 0227 12/23/15 1959 12/24/15 0249 12/25/15 0455  HGB 11.7* 12.2*  --   --   HCT 36.4* 38.6*  --   --   PLT 228 252  --   --   LABPROT 14.4  --  15.1 18.0*  INR 1.12  --  1.19 1.47  CREATININE 0.96 1.18  --   --     Estimated Creatinine Clearance: 52.9 mL/min (by C-G formula based on SCr of 1.18 mg/dL).   Medical History: Past Medical History:  Diagnosis Date  . Atrial flutter ([place])   . BPH (benign prostatic hyperplasia)   . Coronary artery disease    a. s/p MI tx with [place] DES to pRCA in 4/04;  b. Echocardiogram 7/09: Normal LV function.  c. Nuclear study 3/13 no ischemia;  d. ETT 2/14 neg;  e. admx with CP => LHC (01/30/2013):  pLAD 40-50%, oD1 70-80%, oOM1 40%, pRCA stent patent, mid RCA 30%, EF 60-65%. => med Rx.  . DDD (degenerative disc disease)   . Diabetes mellitus without complication ([place])   . Dyslipidemia   . Hemorrhoids   . Hx MRSA infection    left buttocks abscess  . Hx of echocardiogram    a. Echocardiogram (01/31/2013): Mild focal basal and mild concentric hypertrophy of the septum, EF 50-55%, normal wall motion, grade 1 diastolic dysfunction, trivial AI, MAC, mild LAE, PASP 35  . Hyperlipidemia   . Hypertension   . Hypothyroidism   . Meniere's disease    Status post shunt  . Myocardial infarction 2008  . Occlusion and stenosis of carotid artery without mention of cerebral infarction    40-59% on carotid doppler 2014; [place] (01/2013): R 1-39%, L 60-79%  . Rotator cuff injury    chronic rotator cuff injury status post repair  . Seizure disorder ([place])   . Stroke [facility])    a. 01/2013=> post cardiac cath CVA to L post communicating artery system; R sided weakness  . Syncope     Assessment: 82 y/o M on 12/20/2015 with SOB on Xarelto PTA for afib. Drug interaction of Xarelto/carbamazepine has been discussed with MD and anticoagulation changed to warfarin. Carbamazepine has potential to decrease the effectiveness of warfarin, so will follow closely.  -INR= 1.47 (up from 1.19)  Goal of Therapy:  Goal INR 2-3 Monitor platelets by anticoagulation protocol: yes   Plan:  Warfarin 7.5 mg po x 1 Daily INR  Hildred Laser, Pharm D 12/25/2015 8:54 AM

## 2015-12-26 ENCOUNTER — Ambulatory Visit: Payer: Medicare Other | Admitting: Rehabilitative and Restorative Service Providers"

## 2015-12-26 LAB — GLUCOSE, CAPILLARY
Glucose-Capillary: 127 mg/dL — ABNORMAL HIGH (ref 65–99)
Glucose-Capillary: 146 mg/dL — ABNORMAL HIGH (ref 65–99)

## 2015-12-26 LAB — PROTIME-INR
INR: 1.92
Prothrombin Time: 22.2 seconds — ABNORMAL HIGH (ref 11.4–15.2)

## 2015-12-26 MED ORDER — WARFARIN SODIUM 7.5 MG PO TABS: 7.5000 mg | ORAL_TABLET | ORAL | Status: DC

## 2015-12-26 MED ORDER — FUROSEMIDE 20 MG PO TABS: 20.0000 mg | ORAL_TABLET | Freq: Every day | ORAL | 0 refills | Status: DC

## 2015-12-26 MED ORDER — CLONIDINE HCL 0.2 MG PO TABS: 0.2000 mg | ORAL_TABLET | Freq: Two times a day (BID) | ORAL | Status: DC

## 2015-12-26 MED ORDER — WARFARIN SODIUM 5 MG PO TABS: 5.0000 mg | ORAL_TABLET | ORAL | Status: DC

## 2015-12-26 MED ORDER — CLONIDINE HCL 0.2 MG PO TABS: 0.2000 mg | ORAL_TABLET | Freq: Two times a day (BID) | ORAL | 0 refills | Status: DC

## 2015-12-26 MED ORDER — WARFARIN SODIUM 5 MG PO TABS: ORAL_TABLET | ORAL | 0 refills | Status: DC

## 2015-12-26 MED ORDER — PANTOPRAZOLE SODIUM 40 MG PO TBEC: 40.0000 mg | DELAYED_RELEASE_TABLET | Freq: Every day | ORAL | 0 refills | Status: DC

## 2015-12-26 MED ORDER — WARFARIN SODIUM 7.5 MG PO TABS: ORAL_TABLET | ORAL | 0 refills | Status: DC

## 2015-12-26 MED ORDER — METOPROLOL TARTRATE 100 MG PO TABS: 100.0000 mg | ORAL_TABLET | Freq: Two times a day (BID) | ORAL | 0 refills | Status: DC

## 2015-12-26 NOTE — Care Management Note (Signed)
Case Management Note  Patient Details  Name: [name] MRN: [id number] Date of Birth: May 31, 1933  Subjective/Objective: Pt presented for Hypertension Urgency. Pt is from home with wife and support of daughter. Pt has DME: RW at home.                    Action/Plan: CM did speak with pt in regards to [place]. Pt stated he wanted to wait on daughter. Agency List provided and left in room. Post lunch daughter cam to pick up patient. Pt's daughter had a chance to look over list. Plan will be to go home with [facility] for HHPT Services. CM did make referral with Butch Penny and SOC to begin within 24-48 hrs post d/c. No further needs from CM at this time.   Expected Discharge Date:                  Expected Discharge Plan:  [place]  In-House Referral:  NA  Discharge planning Services  CM Consult  Post Acute Care Choice:  Home Health Choice offered to:  Patient, Adult Children  DME Arranged:  N/A DME Agency:  NA  HH Arranged:  PT [place] Agency:  [facility]  Status of Service:  Completed, signed off  If discussed at [place] of Stay Meetings, dates discussed:    Additional Comments:  Bethena Roys, RN 12/26/2015, 2:02 PM

## 2015-12-26 NOTE — Progress Notes (Signed)
ANTICOAGULATION CONSULT NOTE  Pharmacy Consult for warfarin Indication: Atrial fibrillation  No Known Allergies  Patient Measurements: Height: 5\' 5"  (165.1 cm) Weight: 211 lb 8 oz (95.9 kg) IBW/kg (Calculated) : 61.5  Vital Signs: Temp: 98.3 F (36.8 C) (10/06 0415) Temp Source: Oral (10/06 0415) BP: 161/87 (10/06 0832) Pulse Rate: 73 (10/06 0832)  Labs:  Recent Labs  12/23/15 1959 12/24/15 0249 12/25/15 0455 12/26/15 0515  HGB 12.2*  --   --   --   HCT 38.6*  --   --   --   PLT 252  --   --   --   LABPROT  --  15.1 18.0* 22.2*  INR  --  1.19 1.47 1.92  CREATININE 1.18  --   --   --     Estimated Creatinine Clearance: 53.2 mL/min (by C-G formula based on SCr of 1.18 mg/dL).    Assessment: 82 y/o M on 12/20/2015 with SOB on Xarelto PTA for afib. Drug interaction of Xarelto/carbamazepine has been discussed with MD and anticoagulation changed to warfarin. Carbamazepine has potential to decrease the effectiveness of warfarin, so will follow closely.  -INR= 1.92 - almost to goal on day #6 of coumadin.   Goal of Therapy:  Goal INR 2-3 Monitor platelets by anticoagulation protocol: yes   Plan:   warfarin 7.5 mg MWF and 5 mg all other days Daily INR  Eudelia Bunch, Pharm.D. [id number] 12/26/2015 11:09 AM

## 2015-12-26 NOTE — Progress Notes (Signed)
TELEMETRY: Reviewed telemetry pt in NSR: Vitals:   12/25/15 1610 12/25/15 2110 12/26/15 0415 12/26/15 0832  BP: (!) 151/79 (!) 161/95 (!) 185/86 (!) 161/87  Pulse: 70 80 88 73  Resp: 18 18 18    Temp: 98 F (36.7 C) 98 F (36.7 C) 98.3 F (36.8 C)   TempSrc: Oral Oral Oral   SpO2: 94% 94% 98%   Weight:   211 lb 8 oz (95.9 kg)   Height:        Intake/Output Summary (Last 24 hours) at 12/26/15 0924 Last data filed at 12/26/15 0827  Gross per 24 hour  Intake              600 ml  Output                1 ml  Net              599 ml   Filed Weights   12/24/15 0358 12/25/15 0501 12/26/15 0415  Weight: 209 lb 14.4 oz (95.2 kg) 209 lb 8 oz (95 kg) 211 lb 8 oz (95.9 kg)    Subjective  Feels very well. No chest pain or SOB. Has been ambulating without complaints.  Marland Kitchen atorvastatin  80 mg Oral Daily  . carbamazepine  300 mg Oral BID  . cloNIDine  0.2 mg Oral BID  . furosemide  20 mg Oral Daily  . insulin aspart  0-9 Units Subcutaneous TID WC  . isosorbide mononitrate  30 mg Oral Daily  . levothyroxine  75 mcg Oral QAC breakfast  . losartan  50 mg Oral BID  . metoprolol  100 mg Oral BID  . pantoprazole  40 mg Oral Daily  . Warfarin - Pharmacist Dosing Inpatient   Does not apply q1800      LABS: Basic Metabolic Panel:  Recent Labs  12/23/15 1959  NA 135  K 3.9  CL 100*  CO2 27  GLUCOSE 199*  BUN 18  CREATININE 1.18  CALCIUM 8.9   Liver Function Tests: No results for input(s): AST, ALT, ALKPHOS, BILITOT, PROT, ALBUMIN in the last 72 hours. No results for input(s): LIPASE, AMYLASE in the last 72 hours. CBC:  Recent Labs  12/23/15 1959  WBC 12.5*  NEUTROABS 8.9*  HGB 12.2*  HCT 38.6*  MCV 91.9  PLT 252   Cardiac Enzymes: No results for input(s): CKTOTAL, CKMB, CKMBINDEX, TROPONINI in the last 72 hours. BNP: No results for input(s): PROBNP in the last 72 hours. D-Dimer: No results for input(s): DDIMER in the last 72 hours. Hemoglobin A1C: No results  for input(s): HGBA1C in the last 72 hours. Fasting Lipid Panel: No results for input(s): CHOL, HDL, LDLCALC, TRIG, CHOLHDL, LDLDIRECT in the last 72 hours. Thyroid Function Tests: No results for input(s): TSH, T4TOTAL, T3FREE, THYROIDAB in the last 72 hours.  Invalid input(s): FREET3   Radiology/Studies:  No results found. Ecg shows NSR with LVH. T wave inversion in the inferior leads.  PHYSICAL EXAM General: Well developed, well nourished, in no acute distress. Head: Normal Neck: Negative for carotid bruits. JVD not elevated. No adenopathy Lungs: Clear bilaterally to auscultation without wheezes, rales, or rhonchi. Breathing is unlabored. Heart: RRR S1 S2 without murmurs, rubs, or gallops.  Abdomen: Soft, non-tender Extremities: No clubbing, cyanosis or edema.  Distal pedal pulses are 2+ and equal bilaterally. Neuro: Alert and oriented X 3. Moves all extremities spontaneously. Psych:  Responds to questions appropriately with a normal affect.  ASSESSMENT AND PLAN: 1.  Hypertensive emergency - BP still elevated but improved. Will increase clonidine to 0.2 mg bid. Continue metoprolol 100 mg bid, losartan 50 mg bid, lasix 20 mg daily, Imdur 30 mg daily.  Etiology of BP increase is unclear. Recent CT abdomen negative for adrenal tumor. Would consider renal artery duplex as outpatient. Renal duplex studies are not done here.   2. CAD s/p DES to pRCA - Last cath (01/30/2013) showed  pLAD 40-50%, oD1 70-80%, oOM1 40%, pRCA stent patent, mid RCA 30%, EF 60-65%. => med Rx - Echo 11/11/15 showed LV EF of 50-55%, no wm abnormality, grade 1 DD, PA pressure of 32 mm Hg.  - Continue statin. Not on ASA due to need of anticoagulation - Troponin levels minimally elevated with flat trend most consistent with demand ischemia due to #1. No further ischemic work up planned at this time.   3. Carotid stenosis - Carotid doppler 12/12/15 showed 1-39% Right ICA stenosis. 40-59% Left ICA stenosis.  (imrpoved)  4. Paroxysmal atrial flutter - Maintaining sinus rhythm. CHADSVASCs score of 7. Now on coumadin due to concerns of interaction with Tegretol and Xarelto. Adjusting per pharmacy. INR 1.92 today  5. Acute diastolic CHF secondary to #1. Patient has diuresed well. Weight down 8 lbs. I/O negative 3 liters. No evidence of volume overload now.  Patient is stable for DC from a cardiac standpoint. Will need close follow up with Dr. Percival Spanish. We can arrange outpatient renal duplex.    Present on Admission: . Hypertensive emergency . Acute diastolic CHF (congestive heart failure) ([place])   Signed, Peter Martinique, [place] 12/26/2015 9:24 AM

## 2015-12-26 NOTE — Discharge Summary (Signed)
Physician Discharge Summary  [name] [id number] DOB: 27-May-1933 DOA: 12/20/2015  PCP: Daniel Fraction, MD  Admit date: 12/20/2015 Discharge date: 12/26/2015   Recommendations for Outpatient Follow-Up:   Outpatient renal artery duplex INR As outpatient on Wednesday  Discharge Diagnosis:   Active Problems:   Hypertensive emergency   Pulmonary edema   Paroxysmal atrial fibrillation ([place])   Hypothyroidism   Controlled type 2 diabetes mellitus with complication, without long-term current use of insulin (HCC)   Acute diastolic CHF (congestive heart failure) [facility])   Discharge disposition:  Home  Discharge Condition: Improved.  Diet recommendation: Low sodium, heart healthy.  Carbohydrate-modified  Wound care: None.   History of Present Illness:   [name] is an 82M with PMH signifciant for A flutter, MI and CAD s/p stent, diabetes mellitus type 2, prior CVA without significant residual deficits, hypothyroidism, HTN, HLD, and Meniere's disease. He presents to the ED with complaints of shortness of breath. He was largely in his normal state of health until about 2 hours before presentation when he experienced an episode of urinary incontinence and became very upset and agitated. His breathing then became increasingly labored and his saturations dropped. He reports that his blood pressure has been running high for the past couple of days, but more in the 0000000 systolic range. He has had some chest tightness associated with this. He reports he has been compliant with medications. His legs have felt weak, but this is not significantly different from his baseline - no lower extremity edema. He denies dysuria/frequency/urgency. No recent fever / chills. He has a chronic cough, which has been more pronounced for the past two days, but no sputum production or hemoptysis. He denies any neurologic symptoms - no numbness / weakness / tingling, no visual changes. He has intermittent  diarrhea, but his family attributes this to IBS.  In the ED his BP was initially low [id number] systolic. He was put on a nitro gtt, with BP now in the 180s. He was also placed on BiPAP 18/8 @ 70% FiO2, which he seems to be tolerating okay for the moment with improvement in his sats to the mid 90s Labs are largely unremarkable. Cr. essentially at baseline, no elevation of WBC, no left shift, no electrolyte abnormalities.   Hospital Course by Problem:   Hypertensive emergency with acute diastolic CHF: Blood pressure now much better controlled- needs further titration   CAD status post stent placement: Last cardiac catheterization in 2014. Continue statin. Not on aspirin due to being on anticoagulation. Troponin levels were minimally elevated with flat trend consistent with demand ischemia secondary to hypertensive urgency and CHF. No further ischemic workup planned. Cardiology following.    Paroxysmal atrial fibrillation [facility]): Chads 2 score of 7. Due to concerns of interaction with Tegretol and xarelto, patient changed over to Coumadin- follow INR as outpatient    Hypothyroidism: Continue Synthroid.    Controlled type 2 diabetes mellitus with complication, without long-term current use of insulin ([place]): Blood sugars under 180. Stable. Continue home medications. A1c at 6.6    Acute respiratory failure with hypoxia: Secondary to CHF and pulmonary edema. Resolved    Medical Consultants:    cards   Discharge Exam:   Vitals:   12/26/15 0415 12/26/15 0832  BP: (!) 185/86 (!) 161/87  Pulse: 88 73  Resp: 18   Temp: 98.3 F (36.8 C)    Vitals:   12/25/15 1610 12/25/15 2110 12/26/15 0415 12/26/15 0832  BP: (!) 151/79 Marland Kitchen)  161/95 (!) 185/86 (!) 161/87  Pulse: 70 80 88 73  Resp: 18 18 18    Temp: 98 F (36.7 C) 98 F (36.7 C) 98.3 F (36.8 C)   TempSrc: Oral Oral Oral   SpO2: 94% 94% 98%   Weight:   95.9 kg (211 lb 8 oz)   Height:        Gen:  NAD    The results of significant  diagnostics from this hospitalization (including imaging, microbiology, ancillary and laboratory) are listed below for reference.     Procedures and Diagnostic Studies:   Ct Head Wo Contrast  Result Date: 12/20/2015 CLINICAL DATA:  82 y/o M; headache, nausea, and vomiting, similar history of right endolymphatic shunt decompression. EXAM: CT HEAD WITHOUT CONTRAST TECHNIQUE: Contiguous axial images were obtained from the base of the skull through the vertex without intravenous contrast. COMPARISON:  07/24/2015 CT head.  01/31/2013 MRI brain. FINDINGS: Brain: No evidence of acute infarction, hemorrhage, hydrocephalus, extra-axial collection or mass lesion/mass effect. Mild parenchymal volume loss and chronic microvascular ischemic changes are stable. Stable lucencies in the left thalamus, left pons and left cerebellar hemisphere consistent with old lacunar infarcts. Vascular: Extensive calcific atherosclerosis of internal carotid arteries and vertebrobasilar system. No hyperdense vessel. Skull: Wall up right mastoidectomy, no soft tissue within the mastoidectomy bowl. The calvarium is otherwise unremarkable. Sinuses/Orbits: No acute finding. Opacification of right greater than left external auditory canals probably represents cerumen. Other: None. IMPRESSION: No acute intracranial abnormality is identified. Stable chronic microvascular ischemic changes, parenchymal volume loss, intracranial calcific atherosclerosis, and postsurgical changes related to right wall up mastoidectomy. Electronically Signed   By: [name] M.D.   On: 12/20/2015 22:45   Dg Chest Portable 1 View  Result Date: 12/20/2015 CLINICAL DATA:  Acute onset of generalized chest pain and shortness of breath. Initial encounter. EXAM: PORTABLE CHEST 1 VIEW COMPARISON:  Chest radiograph from 10/16/2015 FINDINGS: Diffuse right-sided airspace opacity and mild left basilar airspace opacity are seen, concerning for multifocal  pneumonia. Asymmetric pulmonary edema could have a similar appearance, though it is considered less likely. No pleural effusion or pneumothorax is seen. The cardiomediastinal silhouette remains normal in size. No acute osseous abnormalities are identified. External pacing pads are seen. IMPRESSION: Diffuse right-sided airspace opacity and mild left basilar airspace opacity, concerning for multifocal pneumonia. Asymmetric pulmonary edema could have a similar appearance, though it is considered less likely. Electronically Signed   By: Garald Balding M.D.   On: 12/20/2015 21:08     Labs:   Basic Metabolic Panel:  Recent Labs Lab 12/20/15 1915 12/21/15 0235 12/22/15 0346 12/23/15 0227 12/23/15 1959  NA 139 140 139 137 135  K 3.6 4.0 3.8 3.7 3.9  CL 103 102 102 102 100*  CO2 25 27 30 29 27   GLUCOSE 133* 138* 133* 132* 199*  BUN 14 17 19 17 18   CREATININE 1.02 1.09 1.09 0.96 1.18  CALCIUM 9.3 9.1 8.7* 8.7* 8.9  MG  --  1.5*  --  1.9  --   PHOS  --  2.7  --  3.2  --    GFR Estimated Creatinine Clearance: 53.2 mL/min (by C-G formula based on SCr of 1.18 mg/dL). Liver Function Tests: No results for input(s): AST, ALT, ALKPHOS, BILITOT, PROT, ALBUMIN in the last 168 hours. No results for input(s): LIPASE, AMYLASE in the last 168 hours. No results for input(s): AMMONIA in the last 168 hours. Coagulation profile  Recent Labs Lab 12/22/15 0346 12/23/15 0227 12/24/15  0249 12/25/15 0455 12/26/15 0515  INR 1.12 1.12 1.19 1.47 1.92    CBC:  Recent Labs Lab 12/20/15 1915 12/21/15 0235 12/22/15 0346 12/23/15 0227 12/23/15 1959  WBC 9.1 20.8* 9.5 10.5 12.5*  NEUTROABS  --   --   --   --  8.9*  HGB 12.8* 14.2 12.2* 11.7* 12.2*  HCT 40.8 43.9 38.1* 36.4* 38.6*  MCV 92.9 92.0 92.7 91.9 91.9  PLT 291 291 207 228 252   Cardiac Enzymes:  Recent Labs Lab 12/21/15 1137 12/21/15 1517 12/21/15 2148  TROPONINI 0.07* 0.07* 0.06*   BNP: Invalid input(s): POCBNP CBG:  Recent  Labs Lab 12/25/15 1131 12/25/15 1629 12/25/15 2224 12/26/15 0725 12/26/15 1156  GLUCAP 141* 152* 142* 127* 146*   D-Dimer No results for input(s): DDIMER in the last 72 hours. Hgb A1c No results for input(s): HGBA1C in the last 72 hours. Lipid Profile No results for input(s): CHOL, HDL, LDLCALC, TRIG, CHOLHDL, LDLDIRECT in the last 72 hours. Thyroid function studies No results for input(s): TSH, T4TOTAL, T3FREE, THYROIDAB in the last 72 hours.  Invalid input(s): FREET3 Anemia work up No results for input(s): VITAMINB12, FOLATE, FERRITIN, TIBC, IRON, RETICCTPCT in the last 72 hours. Microbiology Recent Results (from the past 240 hour(s))  MRSA PCR Screening     Status: None   Collection Time: 12/20/15 11:00 PM  Result Value Ref Range Status   MRSA by PCR NEGATIVE NEGATIVE Final    Comment:        The GeneXpert MRSA Assay (FDA approved for NASAL specimens only), is one component of a comprehensive MRSA colonization surveillance program. It is not intended to diagnose MRSA infection nor to guide or monitor treatment for MRSA infections.      Discharge Instructions:   Discharge Instructions    Diet - low sodium heart healthy    Complete by:  As directed    Discharge instructions    Complete by:  As directed    INR check on Wednesday Cbc/bmp 1 week   Increase activity slowly    Complete by:  As directed        Medication List    STOP taking these medications   diazepam 2 MG tablet Commonly known as:  VALIUM   rivaroxaban 20 MG Tabs tablet Commonly known as:  XARELTO   silodosin 8 MG Caps capsule Commonly known as:  RAPAFLO     TAKE these medications   atorvastatin 80 MG tablet Commonly known as:  LIPITOR TAKE 1 TABLET BY MOUTH EVERY DAY What changed:  See the new instructions.   carbamazepine 300 MG 12 hr capsule Commonly known as:  CARBATROL Take 1 capsule (300 mg total) by mouth 2 (two) times daily.   cloNIDine 0.2 MG tablet Commonly known  as:  CATAPRES Take 1 tablet (0.2 mg total) by mouth 2 (two) times daily. What changed:  See the new instructions.   furosemide 20 MG tablet Commonly known as:  LASIX Take 1 tablet (20 mg total) by mouth daily. Start taking on:  12/27/2015   isosorbide mononitrate 30 MG 24 hr tablet Commonly known as:  IMDUR Take 30 mg by mouth daily.   levothyroxine 75 MCG tablet Commonly known as:  SYNTHROID, LEVOTHROID Take 1 tablet (75 mcg total) by mouth daily.   losartan 100 MG tablet Commonly known as:  COZAAR TAKE 1/2 TABLET BY MOUTH 2 TIMES A DAY What changed:  See the new instructions.   metFORMIN 500 MG tablet Commonly known as:  GLUCOPHAGE TAKE 1 TABLET TWICE A DAY WITH A MEAL What changed:  how much to take  how to take this  when to take this  additional instructions   metoprolol 100 MG tablet Commonly known as:  LOPRESSOR Take 1 tablet (100 mg total) by mouth 2 (two) times daily. What changed:  See the new instructions.   multivitamins ther. w/minerals Tabs tablet Take 1 tablet by mouth at bedtime.   pantoprazole 40 MG tablet Commonly known as:  PROTONIX Take 1 tablet (40 mg total) by mouth daily. Start taking on:  12/27/2015   warfarin 7.5 MG tablet Commonly known as:  COUMADIN M/W/F   warfarin 5 MG tablet Commonly known as:  COUMADIN On t/th/sat/sunday (alternate with 7.5)      Follow-up Information    Kerin Ransom, PA-C Follow up on 12/31/2015.   Specialties:  Cardiology, Radiology Why:  at [facility] for your hospital follow up Contact information: [place]  [id number]            Time coordinating discharge: 35 min  Signed:  [facility] 12/26/2015, 1:29 PM

## 2015-12-29 ENCOUNTER — Ambulatory Visit (INDEPENDENT_AMBULATORY_CARE_PROVIDER_SITE_OTHER): Payer: Medicare Other | Admitting: Family Medicine

## 2015-12-29 ENCOUNTER — Encounter: Payer: Self-pay | Admitting: Family Medicine

## 2015-12-29 VITALS — BP 120/64 | HR 60 | Temp 98.3°F | Resp 16 | Ht 65.0 in | Wt 215.0 lb

## 2015-12-29 DIAGNOSIS — I6523 Occlusion and stenosis of bilateral carotid arteries: Secondary | ICD-10-CM | POA: Diagnosis not present

## 2015-12-29 DIAGNOSIS — Z09 Encounter for follow-up examination after completed treatment for conditions other than malignant neoplasm: Secondary | ICD-10-CM | POA: Diagnosis not present

## 2015-12-29 DIAGNOSIS — J069 Acute upper respiratory infection, unspecified: Secondary | ICD-10-CM | POA: Diagnosis not present

## 2015-12-29 MED ORDER — DOXYCYCLINE HYCLATE 100 MG PO TABS: 100.0000 mg | ORAL_TABLET | Freq: Two times a day (BID) | ORAL | 0 refills | Status: DC

## 2015-12-29 NOTE — Progress Notes (Signed)
Subjective:    Patient ID: [name], male    DOB: 1934/01/15, 82 y.o.   MRN: [id number]  HPI Patient was recently admitted to the hospital with hypertensive urgency and shortness of breath. After talking with the patient, his blood pressure had been elevated all day long prior to hospital admission. He became very panicked regarding this. He went to the emergency room. In the emergency room he had an episode of urinary incontinence. That point he says he had a panic attack and started hyperventilating. He was put on BiPAP overnight and was treated as acute diastolic heart failure. The following day he was taken off BiPAP and his breathing improved. His breathing is back to his baseline. His blood pressure at home has been outstanding. All his blood pressures have been less than [id number] systolic over 60 diastolic. Here today his blood pressure is outstanding. They did discontinue his Xarelto and started him on Coumadin. This was due to the interaction of Xarelto a Tegretol which diminishes the effectiveness of Xarelto. He is scheduled to see the cardiologist on Wednesday to have his INR checked. Since being in the hospital, he has developed a cough that is nonproductive. He reports subjective chest congestion. He denies any fevers or chills. His lungs are clear to auscultation bilaterally today. He denies any wheezing or hemoptysis. He denies any purulent sputum. Past Medical History:  Diagnosis Date  . Atrial flutter ([place])   . BPH (benign prostatic hyperplasia)   . Coronary artery disease    a. s/p MI tx with [place] DES to pRCA in 4/04;  b. Echocardiogram 7/09: Normal LV function.  c. Nuclear study 3/13 no ischemia;  d. ETT 2/14 neg;  e. admx with CP => LHC (01/30/2013):  pLAD 40-50%, oD1 70-80%, oOM1 40%, pRCA stent patent, mid RCA 30%, EF 60-65%. => med Rx.  . DDD (degenerative disc disease)   . Diabetes mellitus without complication ([place])   . Dyslipidemia   . Hemorrhoids   . Hx MRSA infection     left buttocks abscess  . Hx of echocardiogram    a. Echocardiogram (01/31/2013): Mild focal basal and mild concentric hypertrophy of the septum, EF 50-55%, normal wall motion, grade 1 diastolic dysfunction, trivial AI, MAC, mild LAE, PASP 35  . Hyperlipidemia   . Hypertension   . Hypothyroidism   . Meniere's disease    Status post shunt  . Myocardial infarction 2008  . Occlusion and stenosis of carotid artery without mention of cerebral infarction    40-59% on carotid doppler 2014; [place] (01/2013): R 1-39%, L 60-79%  . Rotator cuff injury    chronic rotator cuff injury status post repair  . Seizure disorder ([place])   . Stroke [facility])    a. 01/2013=> post cardiac cath CVA to L post communicating artery system; R sided weakness  . Syncope    Past Surgical History:  Procedure Laterality Date  . COLONOSCOPY    . CORONARY ANGIOPLASTY WITH STENT PLACEMENT  07/14/2002   Stent to the right coronary artery  . ENDOLYMPHATIC SHUNT DECOMPRESSION  06/11/2009    Right endolymphatic sac decompression and shunt  placement  . HERNIA REPAIR  04/10/2008   scrotal hernia repair  . LEFT HEART CATHETERIZATION WITH CORONARY ANGIOGRAM N/A 01/30/2013   Procedure: LEFT HEART CATHETERIZATION WITH CORONARY ANGIOGRAM;  Surgeon: Larey Dresser, MD;  Location: [facility] CATH LAB;  Service: Cardiovascular;  Laterality: N/A;  . ROTATOR CUFF REPAIR     for chronic rotator cuff  injury   Current Outpatient Prescriptions on File Prior to Visit  Medication Sig Dispense Refill  . atorvastatin (LIPITOR) 80 MG tablet TAKE 1 TABLET BY MOUTH EVERY DAY (Patient taking differently: Take 80 mg by mouth once a day) 90 tablet 3  . carbamazepine (CARBATROL) 300 MG 12 hr capsule Take 1 capsule (300 mg total) by mouth 2 (two) times daily. 180 capsule 3  . cloNIDine (CATAPRES) 0.2 MG tablet Take 1 tablet (0.2 mg total) by mouth 2 (two) times daily. 60 tablet 0  . furosemide (LASIX) 20 MG tablet Take 1 tablet (20 mg total) by mouth daily.  30 tablet 0  . isosorbide mononitrate (IMDUR) 30 MG 24 hr tablet Take 30 mg by mouth daily.     Marland Kitchen levothyroxine (SYNTHROID, LEVOTHROID) 75 MCG tablet Take 1 tablet (75 mcg total) by mouth daily. 30 tablet 3  . losartan (COZAAR) 100 MG tablet TAKE 1/2 TABLET BY MOUTH 2 TIMES A DAY (Patient taking differently: Take 50 mg by mouth two times a day) 30 tablet 1  . metFORMIN (GLUCOPHAGE) 500 MG tablet TAKE 1 TABLET TWICE A DAY WITH A MEAL (Patient taking differently: Take 500 mg by mouth 2 (two) times daily. ) 180 tablet 3  . metoprolol (LOPRESSOR) 100 MG tablet Take 1 tablet (100 mg total) by mouth 2 (two) times daily. 60 tablet 0  . Multiple Vitamins-Minerals (MULTIVITAMINS THER. W/MINERALS) TABS Take 1 tablet by mouth at bedtime.      . pantoprazole (PROTONIX) 40 MG tablet Take 1 tablet (40 mg total) by mouth daily. 30 tablet 0  . warfarin (COUMADIN) 5 MG tablet On t/th/sat/sunday (alternate with 7.5) 30 tablet 0  . warfarin (COUMADIN) 7.5 MG tablet M/W/F 30 tablet 0   No current facility-administered medications on file prior to visit.    No Known Allergies Social History   Social History  . Marital status: Married    Spouse name: N/A  . Number of children: 4  . Years of education: 12th   Occupational History  . Retired    Social History Main Topics  . Smoking status: Former Smoker    Quit date: 08/19/1961  . Smokeless tobacco: Former Systems developer    Types: Chew  . Alcohol use No  . Drug use: No  . Sexual activity: No   Other Topics Concern  . Not on file   Social History Narrative   Lives with wife.    Review of Systems  Respiratory: Positive for cough.   All other systems reviewed and are negative.      Objective:   Physical Exam  Constitutional: He appears well-developed and well-nourished.  Cardiovascular: Normal rate, regular rhythm and normal heart sounds.   No murmur heard. Pulmonary/Chest: Effort normal and breath sounds normal. No respiratory distress. He has no  wheezes. He has no rales. He exhibits no tenderness.  Abdominal: Soft. Bowel sounds are normal. He exhibits no distension. There is no tenderness. There is no rebound and no guarding.  Vitals reviewed.         Assessment & Plan:  URI, acute - Plan: doxycycline (VIBRA-TABS) 100 MG tablet  Hospital discharge follow-up Patient's blood pressure is now well controlled. I believe some of what the patient may have experienced prior to hospitalization was a panic attack and hyperventilation coupled with hypertensive urgency. However now his blood pressure is stable and well controlled. I did recommend that he resume Valium. They discontinued this in the hospital. However the patient has been on  this medication for a long period of time. I recommend he continue to use it twice a day as needed for anxiety. He should not take this on a scheduled basis but only as needed.. Patient will have his Coumadin level checked his cardiologist. If we can assist with this in the future to ease the patient's transportation burden I will be glad to follow him here if the cardiologists is okay with that. I believe that his cough is due to an upper respiratory infection. I have recommended Mucinex over-the-counter and tincture of time. Should the symptoms worsen, I would have the patient start doxycycline but at the present time I see no indication for this. I will be watchful for possible signs of pneumonia given his recent hospitalization

## 2015-12-31 ENCOUNTER — Ambulatory Visit (INDEPENDENT_AMBULATORY_CARE_PROVIDER_SITE_OTHER): Payer: Medicare Other | Admitting: Pharmacist

## 2015-12-31 ENCOUNTER — Ambulatory Visit: Payer: Medicare Other | Attending: Family Medicine | Admitting: Rehabilitative and Restorative Service Providers"

## 2015-12-31 ENCOUNTER — Ambulatory Visit (INDEPENDENT_AMBULATORY_CARE_PROVIDER_SITE_OTHER): Payer: Medicare Other | Admitting: Cardiology

## 2015-12-31 ENCOUNTER — Encounter: Payer: Self-pay | Admitting: Cardiology

## 2015-12-31 VITALS — BP 119/70 | HR 71

## 2015-12-31 DIAGNOSIS — I5031 Acute diastolic (congestive) heart failure: Secondary | ICD-10-CM

## 2015-12-31 DIAGNOSIS — E785 Hyperlipidemia, unspecified: Secondary | ICD-10-CM

## 2015-12-31 DIAGNOSIS — I480 Paroxysmal atrial fibrillation: Secondary | ICD-10-CM | POA: Diagnosis not present

## 2015-12-31 DIAGNOSIS — I161 Hypertensive emergency: Secondary | ICD-10-CM | POA: Diagnosis not present

## 2015-12-31 DIAGNOSIS — I2510 Atherosclerotic heart disease of native coronary artery without angina pectoris: Secondary | ICD-10-CM

## 2015-12-31 DIAGNOSIS — Z87898 Personal history of other specified conditions: Secondary | ICD-10-CM | POA: Insufficient documentation

## 2015-12-31 DIAGNOSIS — Z9861 Coronary angioplasty status: Secondary | ICD-10-CM

## 2015-12-31 DIAGNOSIS — R2681 Unsteadiness on feet: Secondary | ICD-10-CM | POA: Insufficient documentation

## 2015-12-31 DIAGNOSIS — I6523 Occlusion and stenosis of bilateral carotid arteries: Secondary | ICD-10-CM

## 2015-12-31 DIAGNOSIS — M6281 Muscle weakness (generalized): Secondary | ICD-10-CM | POA: Insufficient documentation

## 2015-12-31 DIAGNOSIS — R6889 Other general symptoms and signs: Secondary | ICD-10-CM | POA: Insufficient documentation

## 2015-12-31 DIAGNOSIS — Z8673 Personal history of transient ischemic attack (TIA), and cerebral infarction without residual deficits: Secondary | ICD-10-CM | POA: Diagnosis not present

## 2015-12-31 DIAGNOSIS — R2689 Other abnormalities of gait and mobility: Secondary | ICD-10-CM | POA: Diagnosis not present

## 2015-12-31 DIAGNOSIS — Z7901 Long term (current) use of anticoagulants: Secondary | ICD-10-CM

## 2015-12-31 DIAGNOSIS — E118 Type 2 diabetes mellitus with unspecified complications: Secondary | ICD-10-CM

## 2015-12-31 LAB — POCT INR: INR: 1.6

## 2015-12-31 NOTE — Therapy (Signed)
[facility] [address] [place], [place], [address] Phone: [phone]   Fax:  [phone]  Patient Details  Name: [name] MRN: [id number] Date of Birth: [birth year] Referring Provider:  [name], MD  Encounter Date: 12/31/2015  PHYSICAL THERAPY DISCHARGE SUMMARY  Visits from Start of Care: 3  Current functional level related to goals / functional outcomes:     PT Short Term Goals - 12/31/15 0943      PT SHORT TERM GOAL #1   Title The patient will be indep with HEP for LE strength, balance, and mobility.   Baseline Recommended today for HEP [place] falls prevention program.   Time 4   Period Weeks   Status Achieved     PT SHORT TERM GOAL #2   Title The patient will improve Berg from 34/56 to > or equal to 39/56 to demo dec'ing risk for falls.   Baseline Improved to 43/56 on 12/31/2015   Time 4   Period Weeks   Status Achieved     PT SHORT TERM GOAL #3   Title The patient will improve gait speed from 2.02 ft/sec to > or equal to 2.4 ft/sec to demo improving functional mobility.   Baseline Met scoring 2.65 ft/sec on 12/31/2015   Time 4   Period Weeks   Status Achieved     PT SHORT TERM GOAL #4   Title The patient will negotiate 4 steps with reciprocal pattern with bilateral handrails.    Baseline Met on 12/31/2015   Time 4   Period Weeks   Status Achieved         PT Long Term Goals - 12/31/15 1010      PT LONG TERM GOAL #1   Title The patient will be indep with post d/c progression of HEP.   Baseline Met on 12/31/2015   Time 8   Period Weeks   Status Achieved     PT LONG TERM GOAL #2   Title The patient will improve Berg from 34/56 to > or equal to 42/56 to demo dec'ing risk for falls.   Baseline Target date 02/03/2016   Time 8   Period Weeks   Status Achieved     PT LONG TERM GOAL #3   Title The patient will improve speed from 2.02 ft/sec to > or equal to 2.62 ft/sec   Baseline Target date 02/03/2016   Time 8   Period Weeks   Status Achieved     PT LONG TERM GOAL #4   Title The patient will negotiate unlevel community surfaces (including grass, inclines, curbs) without a device independently.   Baseline PT recommends patient begin walking on level surfaces at this time, work on home program to gain strength and slowly progress to grassy surfaces as he has increased confidence.   Time 8   Period Weeks   Status Not Met       Remaining deficits: LE weakness Decreased high level balance Fall risk Abnormality of gait   Education / Equipment: HEP to address weakness and balance, home safety, home walking program.   Plan: Patient agrees to discharge.  Patient goals were met. Patient is being discharged due to meeting the stated rehab goals.  ?????         Thank you for the referral of this patient. Rudell Cobb, MPT   [place] 12/31/2015, 7:06 PM  [facility] [address] [place], [place], [address] Phone:  [phone]   Fax:  [phone]

## 2015-12-31 NOTE — Assessment & Plan Note (Signed)
Medications adjusted, B/P under better control

## 2015-12-31 NOTE — Assessment & Plan Note (Signed)
Type 2 NIDDM on oral agnets

## 2015-12-31 NOTE — Assessment & Plan Note (Addendum)
95% right coronary artery stenosis with infarct in 2004 s/p PCI with stent. Cath Nov 2014- medical Rx. Procedure complicated by post cath embolic CVA

## 2015-12-31 NOTE — Assessment & Plan Note (Addendum)
Cardioembolic CVA in 123456 after cath- pt on chronic anticoagulation

## 2015-12-31 NOTE — Assessment & Plan Note (Signed)
On high dose statin Rx 

## 2015-12-31 NOTE — Assessment & Plan Note (Signed)
Admitted 9/30-10/6/17

## 2015-12-31 NOTE — Progress Notes (Signed)
12/31/2015 [name]   Nov 24, 1933  [id number]  Primary Physician Jenna Luo TOM, MD Primary Cardiologist: Dr Percival Spanish  HPI:  82 y/o male followed by Dr Dennard Schaumann and Dr Percival Spanish. The pt has a history of CAD, s/p RCA PCI in 2004. He had a cath in Nov 2014 (medical Rx for diffuse moderate CAD) complicated by a post cath embolic CVA. Other medical problems include a history of seizure (pre dates his stroke), DM, HTN, HLD, PVD with moderate carotid diease. He saw Dr Percival Spanish in Aug 2017 after he was noted to be in A flutter by Dr Dennard Schaumann. Plavix was stopped and Xarelto added. When he was seen in f/u in Sept he was doing well. He recently presented to the ED with accelerated  HTN and acute diastolic CHF [id number]. He was placed on Bi pap overnight and diuresed. His medications were adjusted for B/P. Xarelto was stopped secondary to possible interaction with Tegretol. He is here for a TOC f/u. Since discharge he has done well, he denies any chest pain or dyspnea.    Current Outpatient Prescriptions  Medication Sig Dispense Refill  . atorvastatin (LIPITOR) 80 MG tablet TAKE 1 TABLET BY MOUTH EVERY DAY (Patient taking differently: Take 80 mg by mouth once a day) 90 tablet 3  . carbamazepine (CARBATROL) 300 MG 12 hr capsule Take 1 capsule (300 mg total) by mouth 2 (two) times daily. 180 capsule 3  . cloNIDine (CATAPRES) 0.2 MG tablet Take 1 tablet (0.2 mg total) by mouth 2 (two) times daily. 60 tablet 0  . doxycycline (VIBRA-TABS) 100 MG tablet Take 1 tablet (100 mg total) by mouth 2 (two) times daily. 20 tablet 0  . furosemide (LASIX) 20 MG tablet Take 1 tablet (20 mg total) by mouth daily. 30 tablet 0  . isosorbide mononitrate (IMDUR) 30 MG 24 hr tablet Take 30 mg by mouth daily.     Marland Kitchen levothyroxine (SYNTHROID, LEVOTHROID) 75 MCG tablet Take 1 tablet (75 mcg total) by mouth daily. 30 tablet 3  . losartan (COZAAR) 100 MG tablet TAKE 1/2 TABLET BY MOUTH 2 TIMES A DAY (Patient taking differently:  Take 50 mg by mouth two times a day) 30 tablet 1  . metFORMIN (GLUCOPHAGE) 500 MG tablet TAKE 1 TABLET TWICE A DAY WITH A MEAL (Patient taking differently: Take 500 mg by mouth 2 (two) times daily. ) 180 tablet 3  . metoprolol (LOPRESSOR) 100 MG tablet Take 1 tablet (100 mg total) by mouth 2 (two) times daily. 60 tablet 0  . Multiple Vitamins-Minerals (MULTIVITAMINS THER. W/MINERALS) TABS Take 1 tablet by mouth at bedtime.      . pantoprazole (PROTONIX) 40 MG tablet Take 1 tablet (40 mg total) by mouth daily. 30 tablet 0  . warfarin (COUMADIN) 5 MG tablet On t/th/sat/sunday (alternate with 7.5) 30 tablet 0  . warfarin (COUMADIN) 7.5 MG tablet M/W/F 30 tablet 0   No current facility-administered medications for this visit.     No Known Allergies  Social History   Social History  . Marital status: Married    Spouse name: N/A  . Number of children: 4  . Years of education: 12th   Occupational History  . Retired    Social History Main Topics  . Smoking status: Former Smoker    Quit date: 08/19/1961  . Smokeless tobacco: Former Systems developer    Types: Chew  . Alcohol use No  . Drug use: No  . Sexual activity: No   Other Topics Concern  .  Not on file   Social History Narrative   Lives with wife.     Review of Systems: General: negative for chills, fever, night sweats or weight changes.  Cardiovascular: negative for chest pain, dyspnea on exertion, edema, orthopnea, palpitations, paroxysmal nocturnal dyspnea or shortness of breath Dermatological: negative for rash Respiratory: negative for wheezing- URI treated with ABs per PCP Urologic: negative for hematuria Abdominal: negative for nausea, vomiting, diarrhea, bright red blood per rectum, melena, or hematemesis Neurologic: negative for visual changes, syncope, or dizziness All other systems reviewed and are otherwise negative except as noted above.    Blood pressure (!) 146/86, pulse 71, height 5' 7.5" (1.715 m), weight 215 lb 9.6  oz (97.8 kg).  General appearance: alert, cooperative, no distress and moderately obese Neck: no JVD Lungs: rt basilar crackles Heart: regular rate and rhythm Extremities: no edema Skin: Skin color, texture, turgor normal. No rashes or lesions Neurologic: Grossly normal, Rt sided weakness c/w Lt  EKG NSR, 71, LAD  ASSESSMENT AND PLAN:   Acute diastolic CHF (congestive heart failure) ([place]) Admitted 9/30-10/6/17  Hypertensive emergency Medications adjusted, B/P under better control  History of cardioembolic cerebrovascular accident (CVA) Cardioembolic CVA in 123456 after cath- pt on chronic anticoagulation  Paroxysmal atrial fibrillation [facility]) Noted Aug 2017. Holding NSR. CHADS VASc=7  CAD S/P percutaneous coronary angioplasty  95% right coronary artery stenosis with infarct in 2004 s/p PCI with stent. Cath Nov 2014- medical Rx. Procedure complicated by post cath embolic CVA  Dyslipidemia On high dose statin Rx  Controlled type 2 diabetes mellitus with complication, without long-term current use of insulin (HCC) Type 2 NIDDM on oral agnets  Carotid stenosis 40-59% on carotid doppler 12/12/15  History of seizures Remote history of seizures, followed by [facility] Neurology   PLAN  CHF compensated. B/P acceptable. Will check INR today. Pt would like INR f/u to be with Dr Dennard Schaumann since this is most convenient for him and this will be arranged. I did order renal artery dopplers as suggested by Dr [place] who followed the pt in the hospital;. F/U with Dr Percival Spanish in 4-6 weeks.   Kerin Ransom PA-C 12/31/2015 8:40 AM

## 2015-12-31 NOTE — Assessment & Plan Note (Signed)
40-59% on carotid doppler 12/12/15

## 2015-12-31 NOTE — Assessment & Plan Note (Addendum)
Noted Aug 2017. Holding NSR. CHADS VASc=7

## 2015-12-31 NOTE — Patient Instructions (Signed)
Medications:  Your physician recommends that you continue on your current medications as directed. Please refer to the Current Medication list given to you today.   Testing:  Your physician has requested that you have a renal artery duplex. During this test, an ultrasound is used to evaluate blood flow to the kidneys. Allow one hour for this exam. Do not eat after midnight the day before and avoid carbonated beverages. Take your medications as you usually do.  Follow-Up:  We have scheduled you for a follow-up appointment with Dr. Percival Spanish on 02/02/16 at 9:30 am. Please arrive 15 minutes prior to this appointment. Thank you!  If you need a refill on your cardiac medications before your next appointment, please call your pharmacy.

## 2015-12-31 NOTE — Assessment & Plan Note (Signed)
Remote history of seizures, followed by [facility] Neurology

## 2015-12-31 NOTE — Therapy (Signed)
[name] [address] [place], [place], [address] Phone: [phone]   Fax:  [phone]  Physical Therapy Treatment  Patient Details  Name: [name] MRN: [id number] Date of Birth: 26-Jun-1933 Referring Provider: Jenna Luo, MD  Encounter Date: 12/31/2015      PT End of Session - 12/31/15 1853    Visit Number 3   Number of Visits 10   Date for PT Re-Evaluation 02/03/16   Authorization Type G code every 10th visit   PT Start Time 0933   PT Stop Time 1015   PT Time Calculation (min) 42 min   Activity Tolerance Patient tolerated treatment well   Behavior During Therapy [facility] for tasks assessed/performed      Past Medical History:  Diagnosis Date  . Atrial flutter ([place])   . BPH (benign prostatic hyperplasia)   . Coronary artery disease    a. s/p MI tx with [place]her DES to pRCA in 4/04;  b. Echocardiogram 7/09: Normal LV function.  c. Nuclear study 3/13 no ischemia;  d. ETT 2/14 neg;  e. admx with CP => [facility] (01/30/2013):  pLAD 40-50%, oD1 70-80%, oOM1 40%, pRCA stent patent, mid RCA 30%, EF 60-65%. => med Rx.  . DDD (degenerative disc disease)   . Diabetes mellitus without complication ([place])   . Dyslipidemia   . Hemorrhoids   . Hx MRSA infection    left buttocks abscess  . Hx of echocardiogram    a. Echocardiogram (01/31/2013): Mild focal basal and mild concentric hypertrophy of the septum, EF 50-55%, normal wall motion, grade 1 diastolic dysfunction, trivial AI, MAC, mild LAE, PASP 35  . Hyperlipidemia   . Hypertension   . Hypothyroidism   . Meniere's disease    Status post shunt  . Myocardial infarction 2008  . Occlusion and stenosis of carotid artery without mention of cerebral infarction    40-59% on carotid doppler 2014; [place] (01/2013): R 1-39%, L 60-79%  . Rotator cuff injury    chronic rotator cuff injury status post repair  . Seizure disorder ([place])   . Stroke [facility])    a. 01/2013=> post  cardiac cath CVA to L post communicating artery system; R sided weakness  . Syncope     Past Surgical History:  Procedure Laterality Date  . COLONOSCOPY    . CORONARY ANGIOPLASTY WITH STENT PLACEMENT  07/14/2002   Stent to the right coronary artery  . ENDOLYMPHATIC SHUNT DECOMPRESSION  06/11/2009    Right endolymphatic sac decompression and shunt  placement  . HERNIA REPAIR  04/10/2008   scrotal hernia repair  . LEFT HEART CATHETERIZATION WITH CORONARY ANGIOGRAM N/A 01/30/2013   Procedure: LEFT HEART CATHETERIZATION WITH CORONARY ANGIOGRAM;  Surgeon: Larey Dresser, MD;  Location: [facility] CATH LAB;  Service: Cardiovascular;  Laterality: N/A;  . ROTATOR CUFF REPAIR     for chronic rotator cuff injury    Vitals:   12/31/15 1016  BP: 119/70  Pulse: 71        Subjective Assessment - 12/31/15 0937    Subjective The patient was hospitalized x 6 days and had BP meds adjusted.  He reports dizziness is better since having medications adjusted.   " I still feel bad", describing "hard to explain."  He reports feeling overall weakness, but no dizziness since BP meds adjusted in hospital.  He was in hospital x 6 days last week.              [facility] PT  Assessment - 12/31/15 0944      Ambulation/Gait   Ambulation/Gait Yes   Gait velocity 2.65 ft/sec   Stairs Yes   Stairs Assistance 6: Modified independent (Device/Increase time)   Stair Management Technique Two rails;Alternating pattern   Number of Stairs 4     Berg Balance Test   Sit to Stand Able to stand without using hands and stabilize independently   Standing Unsupported Able to stand safely 2 minutes   Sitting with Back Unsupported but Feet Supported on Floor or Stool Able to sit safely and securely 2 minutes   Stand to Sit Sits safely with minimal use of hands   Transfers Able to transfer safely, definite need of hands   Standing Unsupported with Eyes Closed Able to stand 10 seconds with supervision   Standing Ubsupported with  Feet Together Able to place feet together independently and stand 1 minute safely   From Standing, Reach Forward with Outstretched Arm Can reach forward >5 cm safely (2")   From Standing Position, Pick up Object from Floor Able to pick up shoe safely and easily   From Standing Position, Turn to Look Behind Over each Shoulder Looks behind from both sides and weight shifts well   Turn 360 Degrees Able to turn 360 degrees safely but slowly   Standing Unsupported, Alternately Place Feet on Step/Stool Able to complete 4 steps without aid or supervision   Standing Unsupported, One Foot in Front Able to take small step independently and hold 30 seconds   Standing on One Leg Tries to lift leg/unable to hold 3 seconds but remains standing independently   Total Score 43   Berg comment: 43/56 improving from 34/56 at evaluation.                     [facility] Adult PT Treatment/Exercise - 12/31/15 0944      Self-Care   Self-Care Other Self-Care Comments   Other Self-Care Comments  Discussed home progression of [place] program for post d/c activities.  The patient feels improvement in mobility since hospitalization/med adjustment per his report.  PT noted continued fall risk per Merrilee Jansky, but patient showed improvement since evaluation.  He feels he can perform home balance exercises and call clinic with questions.   Discussed home safety and walking program.              Balance Exercises - 12/31/15 0949      [place] PROGRAM   Head Movements Standing;5 reps   Neck Movements Standing;5 reps   Trunk Movements Standing;5 reps   Ankle Movements Sitting;10 reps   Knee Extensor 10 reps   Knee Flexor 10 reps   Hip ABductor 10 reps   Ankle Plantorflexors 20 reps, support   Ankle Dorsiflexors 20 reps, support   Knee Bends --  unable due to knee pain   Backwards Walking Support   Tandem Walk Support   Stair Walking --  x 4 steps with bilateral handrails.     Overall [place] Comments PT and patient  discussed HEP for [place] to address weakness and balance deficits.            PT Education - 12/31/15 1853    Education provided Yes   Education Details HEP: recommended [place] home program and provided written copy.   Person(s) Educated Patient   Methods Explanation;Demonstration;Handout   Comprehension Verbalized understanding;Returned demonstration          PT Short Term Goals - 12/31/15 [phone]  PT SHORT TERM GOAL #1   Title The patient will be indep with HEP for LE strength, balance, and mobility.   Baseline Recommended today for HEP [place] falls prevention program.   Time 4   Period Weeks   Status Achieved     PT SHORT TERM GOAL #2   Title The patient will improve Berg from 34/56 to > or equal to 39/56 to demo dec'ing risk for falls.   Baseline Improved to 43/56 on 12/31/2015   Time 4   Period Weeks   Status Achieved     PT SHORT TERM GOAL #3   Title The patient will improve gait speed from 2.02 ft/sec to > or equal to 2.4 ft/sec to demo improving functional mobility.   Baseline Met scoring 2.65 ft/sec on 12/31/2015   Time 4   Period Weeks   Status Achieved     PT SHORT TERM GOAL #4   Title The patient will negotiate 4 steps with reciprocal pattern with bilateral handrails.    Baseline Met on 12/31/2015   Time 4   Period Weeks   Status Achieved           PT Long Term Goals - 12/31/15 1010      PT LONG TERM GOAL #1   Title The patient will be indep with post d/c progression of HEP.   Baseline Met on 12/31/2015   Time 8   Period Weeks   Status Achieved     PT LONG TERM GOAL #2   Title The patient will improve Berg from 34/56 to > or equal to 42/56 to demo dec'ing risk for falls.   Baseline Target date 02/03/2016   Time 8   Period Weeks   Status Achieved     PT LONG TERM GOAL #3   Title The patient will improve speed from 2.02 ft/sec to > or equal to 2.62 ft/sec   Baseline Target date 02/03/2016   Time 8   Period Weeks   Status Achieved      PT LONG TERM GOAL #4   Title The patient will negotiate unlevel community surfaces (including grass, inclines, curbs) without a device independently.   Baseline PT recommends patient begin walking on level surfaces at this time, work on home program to gain strength and slowly progress to grassy surfaces as he has increased confidence.   Time 8   Period Weeks   Status Not Met               Plan - 12/31/15 1856    Clinical Impression Statement The patient met 4/4 STGs and 3/4 LTGs.  He notes that dizziness has improved significantly since being hospitalized and having medications adjusted.   He overall notes improvement in mobility, however still expresses dec'd confidence with community gait and some LE weakness.  PT provided a home program, [place], designed to reduce fall risk in patients >80, and patient was able to perform with written handouts and instruction today.      PT Treatment/Interventions ADLs/Self Care Home Management;Therapeutic activities;Therapeutic exercise;Neuromuscular re-education;Balance training;Gait training;Functional mobility training;Patient/family education   PT Next Visit Plan Discharge today.    Consulted and Agree with Plan of Care Patient      Patient will benefit from skilled therapeutic intervention in order to improve the following deficits and impairments:  Abnormal gait, Difficulty walking, Decreased balance, Decreased mobility, Decreased strength  Visit Diagnosis: Other abnormalities of gait and mobility  Unsteadiness on feet  Muscle weakness (generalized)  G-Codes - 12/31/15 1904    Functional Assessment Tool Used Berg=43/56   Functional Limitation Mobility: Walking and moving around   Mobility: Walking and Moving Around Goal Status [phone]) At least 20 percent but less than 40 percent impaired, limited or restricted   Mobility: Walking and Moving Around Discharge Status [phone]) At least 20 percent but less than 40 percent impaired,  limited or restricted      Problem List Patient Active Problem List   Diagnosis Date Noted  . Chronic anticoagulation 12/31/2015  . History of seizures 12/31/2015  . Acute diastolic CHF (congestive heart failure) ([place]) 12/24/2015  . Pulmonary edema   . Paroxysmal atrial fibrillation (HCC)   . Hypothyroidism   . Controlled type 2 diabetes mellitus with complication, without long-term current use of insulin ([place])   . Hypertensive emergency 12/20/2015  . Special screening for malignant neoplasms, colon 12/07/2013  . Bowel habit changes 12/07/2013  . Diarrhea 03/09/2013  . History of cardioembolic cerebrovascular accident (CVA) 01/31/2013  . Chest pain 01/29/2013  . Carotid stenosis   . Loss of coordination 12/03/2012  . Obesity 08/20/2011  . CAD S/P percutaneous coronary angioplasty   . Dyslipidemia   . Hypertension     WEAVER,CHRISTINA, PT 12/31/2015, 7:06 PM  [facility] [address] [place], [place], [address] Phone: [phone]   Fax:  [phone]  Name: [name] MRN: [id number] Date of Birth: 05/21/1933

## 2016-01-05 DIAGNOSIS — C44622 Squamous cell carcinoma of skin of right upper limb, including shoulder: Secondary | ICD-10-CM | POA: Diagnosis not present

## 2016-01-08 ENCOUNTER — Encounter: Payer: Self-pay | Admitting: Family Medicine

## 2016-01-15 ENCOUNTER — Other Ambulatory Visit: Payer: Self-pay | Admitting: Family Medicine

## 2016-01-15 ENCOUNTER — Ambulatory Visit (HOSPITAL_COMMUNITY)
Admission: RE | Admit: 2016-01-15 | Discharge: 2016-01-15 | Disposition: A | Discharge status: Home / Self Care | Payer: Medicare Other | Source: Ambulatory Visit | Attending: Cardiovascular Disease | Admitting: Cardiovascular Disease

## 2016-01-15 DIAGNOSIS — I161 Hypertensive emergency: Secondary | ICD-10-CM | POA: Insufficient documentation

## 2016-01-21 ENCOUNTER — Other Ambulatory Visit: Payer: Self-pay | Admitting: Family Medicine

## 2016-01-26 ENCOUNTER — Encounter: Payer: Self-pay | Admitting: Family Medicine

## 2016-01-26 ENCOUNTER — Ambulatory Visit (INDEPENDENT_AMBULATORY_CARE_PROVIDER_SITE_OTHER): Payer: Medicare Other | Admitting: Family Medicine

## 2016-01-26 VITALS — BP 118/68 | HR 60 | Temp 99.0°F | Resp 18 | Ht 65.0 in | Wt 218.0 lb

## 2016-01-26 DIAGNOSIS — I6523 Occlusion and stenosis of bilateral carotid arteries: Secondary | ICD-10-CM | POA: Diagnosis not present

## 2016-01-26 DIAGNOSIS — I480 Paroxysmal atrial fibrillation: Secondary | ICD-10-CM

## 2016-01-26 DIAGNOSIS — Z8673 Personal history of transient ischemic attack (TIA), and cerebral infarction without residual deficits: Secondary | ICD-10-CM | POA: Diagnosis not present

## 2016-01-26 LAB — PT WITH INR/FINGERSTICK
INR, fingerstick: 1.2 (ref 0.80–1.20)
PT, fingerstick: 14.3 seconds — ABNORMAL HIGH (ref 10.4–12.5)

## 2016-01-26 NOTE — Progress Notes (Signed)
Subjective:    Patient ID: SIG CLISHAM, male    DOB: [birth year], 82 y.o.   MRN: [id number]  HPI On coumadin for atrial flutter.  On 7.5 on M/W/F.  5 mg on T/R/Sat/Sun.  Overall he is doing well. He denies any bleeding or bruising. He does have small senile purpura on his forearms but nothing unexpected. He denies any blood in his stool or epistaxis. He denies any dizziness or syncope or presyncope. His heart rate today is in the mid 60s and regular. He appears to be in sinus rhythm. He denies any shortness of breath. Past Medical History:  Diagnosis Date  . Atrial flutter ([place])   . BPH (benign prostatic hyperplasia)   . Coronary artery disease    a. s/p MI tx with Cypher DES to pRCA in 4/04;  b. Echocardiogram 7/09: Normal LV function.  c. Nuclear study 3/13 no ischemia;  d. ETT 2/14 neg;  e. admx with CP => LHC (01/30/2013):  pLAD 40-50%, oD1 70-80%, oOM1 40%, pRCA stent patent, mid RCA 30%, EF 60-65%. => med Rx.  . DDD (degenerative disc disease)   . Diabetes mellitus without complication ([place])   . Dyslipidemia   . Hemorrhoids   . Hx MRSA infection    left buttocks abscess  . Hx of echocardiogram    a. Echocardiogram (01/31/2013): Mild focal basal and mild concentric hypertrophy of the septum, EF 50-55%, normal wall motion, grade 1 diastolic dysfunction, trivial AI, MAC, mild LAE, PASP 35  . Hyperlipidemia   . Hypertension   . Hypothyroidism   . Meniere's disease    Status post shunt  . Myocardial infarction 2008  . Occlusion and stenosis of carotid artery without mention of cerebral infarction    40-59% on carotid doppler 2014; [place] (01/2013): R 1-39%, L 60-79%  . Rotator cuff injury    chronic rotator cuff injury status post repair  . Seizure disorder ([place])   . Stroke [facility])    a. 01/2013=> post cardiac cath CVA to L post communicating artery system; R sided weakness  . Syncope    Past Surgical History:  Procedure Laterality Date  . COLONOSCOPY    . CORONARY ANGIOPLASTY  WITH STENT PLACEMENT  07/14/2002   Stent to the right coronary artery  . ENDOLYMPHATIC SHUNT DECOMPRESSION  06/11/2009    Right endolymphatic sac decompression and shunt  placement  . HERNIA REPAIR  04/10/2008   scrotal hernia repair  . LEFT HEART CATHETERIZATION WITH CORONARY ANGIOGRAM N/A 01/30/2013   Procedure: LEFT HEART CATHETERIZATION WITH CORONARY ANGIOGRAM;  Surgeon: Larey Dresser, MD;  Location: [facility] CATH LAB;  Service: Cardiovascular;  Laterality: N/A;  . ROTATOR CUFF REPAIR     for chronic rotator cuff injury   Current Outpatient Prescriptions on File Prior to Visit  Medication Sig Dispense Refill  . atorvastatin (LIPITOR) 80 MG tablet TAKE 1 TABLET BY MOUTH EVERY DAY (Patient taking differently: Take 80 mg by mouth once a day) 90 tablet 3  . carbamazepine (CARBATROL) 300 MG 12 hr capsule Take 1 capsule (300 mg total) by mouth 2 (two) times daily. 180 capsule 3  . cloNIDine (CATAPRES) 0.2 MG tablet TAKE 1 TABLET BY MOUTH TWICE A DAY 60 tablet 0  . furosemide (LASIX) 20 MG tablet TAKE 1 TABLET BY MOUTH EVERY DAY 30 tablet 0  . isosorbide mononitrate (IMDUR) 30 MG 24 hr tablet TAKE 1 TABLET BY MOUTH EVERY DAY 90 tablet 1  . levothyroxine (SYNTHROID, LEVOTHROID) 75 MCG tablet  Take 1 tablet (75 mcg total) by mouth daily. 30 tablet 3  . losartan (COZAAR) 100 MG tablet TAKE 1/2 TABLET BY MOUTH 2 TIMES A DAY (Patient taking differently: Take 50 mg by mouth two times a day) 30 tablet 1  . metFORMIN (GLUCOPHAGE) 500 MG tablet TAKE 1 TABLET TWICE A DAY WITH A MEAL (Patient taking differently: Take 500 mg by mouth 2 (two) times daily. ) 180 tablet 3  . metoprolol (LOPRESSOR) 100 MG tablet TAKE 1 TABLET BY MOUTH TWICE A DAY 60 tablet 0  . Multiple Vitamins-Minerals (MULTIVITAMINS THER. W/MINERALS) TABS Take 1 tablet by mouth at bedtime.      . pantoprazole (PROTONIX) 40 MG tablet Take 1 tablet (40 mg total) by mouth daily. 30 tablet 0  . warfarin (COUMADIN) 5 MG tablet On t/th/sat/sunday  (alternate with 7.5) 30 tablet 0  . warfarin (COUMADIN) 7.5 MG tablet M/W/F 30 tablet 0   No current facility-administered medications on file prior to visit.    No Known Allergies Social History   Social History  . Marital status: Married    Spouse name: N/A  . Number of children: 4  . Years of education: 12th   Occupational History  . Retired    Social History Main Topics  . Smoking status: Former Smoker    Quit date: 08/19/1961  . Smokeless tobacco: Former Systems developer    Types: Chew  . Alcohol use No  . Drug use: No  . Sexual activity: No   Other Topics Concern  . Not on file   Social History Narrative   Lives with wife.      Review of Systems  All other systems reviewed and are negative.      Objective:   Physical Exam  Constitutional: He appears well-developed and well-nourished.  Cardiovascular: Normal rate, regular rhythm and normal heart sounds.   Pulmonary/Chest: Effort normal. No respiratory distress. He has no wheezes. He has no rales.  Abdominal: Soft. Bowel sounds are normal.  Musculoskeletal: He exhibits no edema.  Vitals reviewed.         Assessment & Plan:  History of CVA (cerebrovascular accident) - Plan: PT with INR/Fingerstick  Paroxysmal atrial fibrillation ([place]) - Plan: PT with INR/Fingerstick  INR is 1.2 and subtherapeutic. Therefore I will have the patient switch to 7.5 mg every day and recheck his INR in 2 weeks.

## 2016-01-26 NOTE — Addendum Note (Signed)
Addended by: Haskel Khan A on: 01/26/2016 09:16 AM   Modules accepted: Orders

## 2016-02-01 NOTE — Progress Notes (Signed)
Cardiology Office Note   Date:  02/04/2016   ID:  [name], DOB 09-Dec-1933, MRN [id number]  PCP:  Daniel Fraction, MD  Cardiologist:   Minus Breeding, MD   Chief Complaint  Patient presents with  . Shortness of Breath      History of Present Illness: [name] is a 82 y.o. male who presents for followup of coronary disease.  After his catheterization in November 2014 he had a CVA. This was embolic following the procedure. He has had a significant recovery from this but still has some visual deficits on his right side and right-sided arm and leg weakness.  I saw him recently after he was noted to be in atrial flutter.   Xarelto was started and his Plavix was stopped.   He was in sinus when I saw him and when he wore a Holter after that appt.  Echo was essentially unremarkable.  Since I last saw him he was in the ED with accelerated  HTN and acute diastolic CHF on [id number]. He was placed on Bi pap overnight and diuresed. His medications were adjusted for B/P. Xarelto was stopped secondary to possible interaction with Tegretol.  He was switched to warfarin.  Since then he has done well.  The patient denies any new symptoms such as chest discomfort, neck or arm discomfort. There has been no new shortness of breath, PND or orthopnea. There have been no reported palpitations, presyncope or syncope.  He does some rehab exercises 15 minutes daily.    Past Medical History:  Diagnosis Date  . Atrial flutter ([place])   . BPH (benign prostatic hyperplasia)   . Coronary artery disease    a. s/p MI tx with Cypher DES to pRCA in 4/04;  b. Echocardiogram 7/09: Normal LV function.  c. Nuclear study 3/13 no ischemia;  d. ETT 2/14 neg;  e. admx with CP => LHC (01/30/2013):  pLAD 40-50%, oD1 70-80%, oOM1 40%, pRCA stent patent, mid RCA 30%, EF 60-65%. => med Rx.  . DDD (degenerative disc disease)   . Diabetes mellitus without complication ([place])   . Dyslipidemia   . Hemorrhoids   . Hx  MRSA infection    left buttocks abscess  . Hx of echocardiogram    a. Echocardiogram (01/31/2013): Mild focal basal and mild concentric hypertrophy of the septum, EF 50-55%, normal wall motion, grade 1 diastolic dysfunction, trivial AI, MAC, mild LAE, PASP 35  . Hyperlipidemia   . Hypertension   . Hypothyroidism   . Meniere's disease    Status post shunt  . Myocardial infarction 2008  . Occlusion and stenosis of carotid artery without mention of cerebral infarction    40-59% on carotid doppler 2014; [place] (01/2013): R 1-39%, L 60-79%  . Rotator cuff injury    chronic rotator cuff injury status post repair  . Seizure disorder ([place])   . Stroke [facility])    a. 01/2013=> post cardiac cath CVA to L post communicating artery system; R sided weakness  . Syncope     Past Surgical History:  Procedure Laterality Date  . COLONOSCOPY    . CORONARY ANGIOPLASTY WITH STENT PLACEMENT  07/14/2002   Stent to the right coronary artery  . ENDOLYMPHATIC SHUNT DECOMPRESSION  06/11/2009    Right endolymphatic sac decompression and shunt  placement  . HERNIA REPAIR  04/10/2008   scrotal hernia repair  . LEFT HEART CATHETERIZATION WITH CORONARY ANGIOGRAM N/A 01/30/2013   Procedure: LEFT HEART CATHETERIZATION WITH CORONARY  ANGIOGRAM;  Surgeon: Larey Dresser, MD;  [facility];  Service: Cardiovascular;  Laterality: N/A;  . ROTATOR CUFF REPAIR     for chronic rotator cuff injury     Current Outpatient Prescriptions  Medication Sig Dispense Refill  . atorvastatin (LIPITOR) 80 MG tablet TAKE 1 TABLET BY MOUTH EVERY DAY (Patient taking differently: Take 80 mg by mouth once a day) 90 tablet 3  . carbamazepine (CARBATROL) 300 MG 12 hr capsule Take 1 capsule (300 mg total) by mouth 2 (two) times daily. 180 capsule 3  . cloNIDine (CATAPRES) 0.2 MG tablet TAKE 1 TABLET BY MOUTH TWICE A DAY 60 tablet 0  . furosemide (LASIX) 20 MG tablet TAKE 1 TABLET BY MOUTH EVERY DAY 30 tablet 0  . isosorbide mononitrate  (IMDUR) 30 MG 24 hr tablet TAKE 1 TABLET BY MOUTH EVERY DAY 90 tablet 1  . levothyroxine (SYNTHROID, LEVOTHROID) 75 MCG tablet Take 1 tablet (75 mcg total) by mouth daily. 30 tablet 3  . losartan (COZAAR) 100 MG tablet TAKE 1/2 TABLET BY MOUTH 2 TIMES A DAY (Patient taking differently: Take 50 mg by mouth two times a day) 30 tablet 1  . metFORMIN (GLUCOPHAGE) 500 MG tablet TAKE 1 TABLET TWICE A DAY WITH A MEAL (Patient taking differently: Take 500 mg by mouth 2 (two) times daily. ) 180 tablet 3  . metoprolol (LOPRESSOR) 100 MG tablet TAKE 1 TABLET BY MOUTH TWICE A DAY 60 tablet 0  . Multiple Vitamins-Minerals (MULTIVITAMINS THER. W/MINERALS) TABS Take 1 tablet by mouth at bedtime.      . pantoprazole (PROTONIX) 40 MG tablet Take 1 tablet (40 mg total) by mouth daily. 30 tablet 0  . warfarin (COUMADIN) 5 MG tablet On t/th/sat/sunday (alternate with 7.5) 30 tablet 0  . warfarin (COUMADIN) 7.5 MG tablet M/W/F 30 tablet 0   No current facility-administered medications for this visit.     Allergies:   Patient has no known allergies.   ROS:  Please see the history of present illness.   Otherwise, review of systems are positive for back pain and quick to anger   All other systems are reviewed and negative.    PHYSICAL EXAM: VS:  BP (!) 146/90 (BP Location: Left Arm, Patient Position: Sitting, Cuff Size: Normal)   Pulse 65   Ht 5\' 8"  (1.727 m)   Wt 216 lb (98 kg)   SpO2 97%   BMI 32.84 kg/m  , BMI Body mass index is 32.84 kg/m. GENERAL:  Well appearing NECK:  No jugular venous distention, waveform within normal limits, carotid upstroke brisk and symmetric, no bruits, no thyromegaly LUNGS:  Clear to auscultation bilaterally BACK:  No CVA tenderness CHEST:  Unremarkable HEART:  PMI not displaced or sustained,S1 and S2 within normal limits, no S3, no S4, no clicks, no rubs, no murmurs ABD:  Flat, positive bowel sounds normal in frequency in pitch, no bruits, no rebound, no guarding, no midline  pulsatile mass, no hepatomegaly, no splenomegaly EXT:  2 plus pulses throughout, mild right leg edema, no cyanosis no clubbing, large right forearm lipoma.     EKG:  EKG is not  ordered today.   Recent Labs: 10/30/2015: TSH 4.71 12/15/2015: ALT 16 12/23/2015: BUN 18; Creatinine, Ser 1.18; Hemoglobin 12.2; Magnesium 1.9; Platelets 252; Potassium 3.9; Sodium 135    Lipid Panel    Component Value Date/Time   CHOL 169 11/15/2014 0848   TRIG 139 11/15/2014 0848   HDL 50 11/15/2014 0848  CHOLHDL 3.4 11/15/2014 0848   VLDL 28 11/15/2014 0848   LDLCALC 91 11/15/2014 0848      Wt Readings from Last 3 Encounters:  02/02/16 216 lb (98 kg)  01/26/16 218 lb (98.9 kg)  12/31/15 215 lb 9.6 oz (97.8 kg)      Other studies Reviewed: Additional studies/ records that were reviewed today include: None Review of the above records demonstrates:      ASSESSMENT AND PLAN:  Hypertension -   His BP is well controlled.  He will continue on the meds as listed.    Flutter -  . Mr. Lanorris Krajicek Rominger has a CHA2DS2 - VASc score of 7 with a risk of stroke of 9.7%.  This seems to be paroxysmal. He's tolerating anticoagulation. No change in therapy is planned.   Anemia -    He does have a mild anemia but was stable on follow up labs in Oct.  I will defer to [place], MD   Coronary artery disease -  The patient has no new sypmtoms.  No further cardiovascular testing is indicated.  We will continue with aggressive risk reduction and meds as listed.  CVA-  He has some residual symptoms.  No change in therapy is needed.    Dyslipidemia -  I will follow this up at the next appt. Lab Results  Component Value Date   CHOL 169 11/15/2014   TRIG 139 11/15/2014   HDL 50 11/15/2014   LDLCALC 91 11/15/2014    Carotid stenosis - He had Doppler in Sept with 1-39% Right ICA stenosis.and 40-59% Left ICA stenosis.  We will follow up in one year.       Current medicines are reviewed at  length with the patient today.  The patient does not have concerns regarding medicines.  The following changes have been made:  None  Labs/ tests ordered today include:   No orders of the defined types were placed in this encounter.    Disposition:   FU with me in 6 months.     Signed, Minus Breeding, MD  02/04/2016 3:38 PM    [facility]

## 2016-02-02 ENCOUNTER — Ambulatory Visit (INDEPENDENT_AMBULATORY_CARE_PROVIDER_SITE_OTHER): Payer: Medicare Other | Admitting: Cardiology

## 2016-02-02 ENCOUNTER — Encounter: Payer: Self-pay | Admitting: Cardiology

## 2016-02-02 VITALS — BP 146/90 | HR 65 | Ht 68.0 in | Wt 216.0 lb

## 2016-02-02 DIAGNOSIS — I110 Hypertensive heart disease with heart failure: Secondary | ICD-10-CM

## 2016-02-02 DIAGNOSIS — I6523 Occlusion and stenosis of bilateral carotid arteries: Secondary | ICD-10-CM | POA: Diagnosis not present

## 2016-02-02 NOTE — Patient Instructions (Signed)
Medication Instructions:  Continue current medications  Labwork: None Ordered  Testing/Procedures: None Ordered  Follow-Up: Your physician wants you to follow-up in: 6 Months. You will receive a reminder letter in the mail two months in advance. If you don't receive a letter, please call our office to schedule the follow-up appointment.   Any Other Special Instructions Will Be Listed Below (If Applicable).        Happy Thanksgiving  If you need a refill on your cardiac medications before your next appointment, please call your pharmacy.

## 2016-02-04 ENCOUNTER — Encounter: Payer: Self-pay | Admitting: Cardiology

## 2016-02-10 ENCOUNTER — Other Ambulatory Visit: Payer: Self-pay | Admitting: Family Medicine

## 2016-02-10 MED ORDER — LEVOTHYROXINE SODIUM 75 MCG PO TABS: 75.0000 ug | ORAL_TABLET | Freq: Every day | ORAL | 3 refills | Status: DC

## 2016-02-11 ENCOUNTER — Ambulatory Visit (INDEPENDENT_AMBULATORY_CARE_PROVIDER_SITE_OTHER): Payer: Medicare Other | Admitting: Family Medicine

## 2016-02-11 ENCOUNTER — Encounter: Payer: Self-pay | Admitting: Family Medicine

## 2016-02-11 DIAGNOSIS — I6523 Occlusion and stenosis of bilateral carotid arteries: Secondary | ICD-10-CM | POA: Diagnosis not present

## 2016-02-11 DIAGNOSIS — Z8673 Personal history of transient ischemic attack (TIA), and cerebral infarction without residual deficits: Secondary | ICD-10-CM | POA: Diagnosis not present

## 2016-02-11 DIAGNOSIS — I480 Paroxysmal atrial fibrillation: Secondary | ICD-10-CM

## 2016-02-11 LAB — PT WITH INR/FINGERSTICK
INR, fingerstick: 1.7 — ABNORMAL HIGH (ref 0.80–1.20)
PT, fingerstick: 19.9 seconds — ABNORMAL HIGH (ref 10.4–12.5)

## 2016-02-11 MED ORDER — WARFARIN SODIUM 7.5 MG PO TABS: ORAL_TABLET | ORAL | 11 refills | Status: DC

## 2016-02-11 NOTE — Addendum Note (Signed)
Addended by: Jenna Luo on: 02/11/2016 09:52 AM   Modules accepted: Orders

## 2016-02-11 NOTE — Progress Notes (Signed)
Subjective:    Patient ID: [name], male    DOB: Sep 05, 1933, 82 y.o.   MRN: [id number]  HPI  01/27/16 On coumadin for atrial flutter.  On 7.5 on M/W/F.  5 mg on T/R/Sat/Sun.  Overall he is doing well. He denies any bleeding or bruising. He does have small senile purpura on his forearms but nothing unexpected. He denies any blood in his stool or epistaxis. He denies any dizziness or syncope or presyncope. His heart rate today is in the mid 60s and regular. He appears to be in sinus rhythm. He denies any shortness of breath.  At that time, my plan was: INR is 1.2 and subtherapeutic. Therefore I will have the patient switch to 7.5 mg every day and recheck his INR in 2 weeks.  02/11/16 INR today is 1.7 and still subtherapeutic. He is taking 7.5 mg every day Past Medical History:  Diagnosis Date  . Atrial flutter ([place])   . BPH (benign prostatic hyperplasia)   . Coronary artery disease    a. s/p MI tx with Cypher DES to pRCA in 4/04;  b. Echocardiogram 7/09: Normal LV function.  c. Nuclear study 3/13 no ischemia;  d. ETT 2/14 neg;  e. admx with CP => LHC (01/30/2013):  pLAD 40-50%, oD1 70-80%, oOM1 40%, pRCA stent patent, mid RCA 30%, EF 60-65%. => med Rx.  . DDD (degenerative disc disease)   . Diabetes mellitus without complication ([place])   . Dyslipidemia   . Hemorrhoids   . Hx MRSA infection    left buttocks abscess  . Hx of echocardiogram    a. Echocardiogram (01/31/2013): Mild focal basal and mild concentric hypertrophy of the septum, EF 50-55%, normal wall motion, grade 1 diastolic dysfunction, trivial AI, MAC, mild LAE, PASP 35  . Hyperlipidemia   . Hypertension   . Hypothyroidism   . Meniere's disease    Status post shunt  . Myocardial infarction 2008  . Occlusion and stenosis of carotid artery without mention of cerebral infarction    40-59% on carotid doppler 2014; [place] (01/2013): R 1-39%, L 60-79%  . Rotator cuff injury    chronic rotator cuff injury status post repair  .  Seizure disorder ([place])   . Stroke [facility])    a. 01/2013=> post cardiac cath CVA to L post communicating artery system; R sided weakness  . Syncope    Past Surgical History:  Procedure Laterality Date  . COLONOSCOPY    . CORONARY ANGIOPLASTY WITH STENT PLACEMENT  07/14/2002   Stent to the right coronary artery  . ENDOLYMPHATIC SHUNT DECOMPRESSION  06/11/2009    Right endolymphatic sac decompression and shunt  placement  . HERNIA REPAIR  04/10/2008   scrotal hernia repair  . LEFT HEART CATHETERIZATION WITH CORONARY ANGIOGRAM N/A 01/30/2013   Procedure: LEFT HEART CATHETERIZATION WITH CORONARY ANGIOGRAM;  Surgeon: Larey Dresser, MD;  Location: [facility] CATH LAB;  Service: Cardiovascular;  Laterality: N/A;  . ROTATOR CUFF REPAIR     for chronic rotator cuff injury   Current Outpatient Prescriptions on File Prior to Visit  Medication Sig Dispense Refill  . atorvastatin (LIPITOR) 80 MG tablet TAKE 1 TABLET BY MOUTH EVERY DAY (Patient taking differently: Take 80 mg by mouth once a day) 90 tablet 3  . carbamazepine (CARBATROL) 300 MG 12 hr capsule Take 1 capsule (300 mg total) by mouth 2 (two) times daily. 180 capsule 3  . cloNIDine (CATAPRES) 0.2 MG tablet TAKE 1 TABLET BY MOUTH TWICE A  DAY 60 tablet 0  . furosemide (LASIX) 20 MG tablet TAKE 1 TABLET BY MOUTH EVERY DAY 30 tablet 0  . isosorbide mononitrate (IMDUR) 30 MG 24 hr tablet TAKE 1 TABLET BY MOUTH EVERY DAY 90 tablet 1  . levothyroxine (SYNTHROID, LEVOTHROID) 75 MCG tablet Take 1 tablet (75 mcg total) by mouth daily. 90 tablet 3  . losartan (COZAAR) 100 MG tablet TAKE 1/2 TABLET BY MOUTH 2 TIMES A DAY (Patient taking differently: Take 50 mg by mouth two times a day) 30 tablet 1  . metFORMIN (GLUCOPHAGE) 500 MG tablet TAKE 1 TABLET TWICE A DAY WITH A MEAL (Patient taking differently: Take 500 mg by mouth 2 (two) times daily. ) 180 tablet 3  . metoprolol (LOPRESSOR) 100 MG tablet TAKE 1 TABLET BY MOUTH TWICE A DAY 60 tablet 0  . Multiple  Vitamins-Minerals (MULTIVITAMINS THER. W/MINERALS) TABS Take 1 tablet by mouth at bedtime.      . pantoprazole (PROTONIX) 40 MG tablet Take 1 tablet (40 mg total) by mouth daily. 30 tablet 0  . warfarin (COUMADIN) 5 MG tablet On t/th/sat/sunday (alternate with 7.5) 30 tablet 0  . warfarin (COUMADIN) 7.5 MG tablet M/W/F 30 tablet 0   No current facility-administered medications on file prior to visit.    No Known Allergies Social History   Social History  . Marital status: Married    Spouse name: N/A  . Number of children: 4  . Years of education: 12th   Occupational History  . Retired    Social History Main Topics  . Smoking status: Former Smoker    Quit date: 08/19/1961  . Smokeless tobacco: Former Systems developer    Types: Chew  . Alcohol use No  . Drug use: No  . Sexual activity: No   Other Topics Concern  . Not on file   Social History Narrative   Lives with wife.      Review of Systems  All other systems reviewed and are negative.      Objective:   Physical Exam  Constitutional: He appears well-developed and well-nourished.  Cardiovascular: Normal rate, regular rhythm and normal heart sounds.   Pulmonary/Chest: Effort normal. No respiratory distress. He has no wheezes. He has no rales.  Abdominal: Soft. Bowel sounds are normal.  Musculoskeletal: He exhibits no edema.  Vitals reviewed.         Assessment & Plan:  History of CVA (cerebrovascular accident) - Plan: PT with INR/Fingerstick  Paroxysmal atrial fibrillation ([place]) - Plan: PT with INR/Fingerstick  Change Coumadin to 1 tablet on Tuesday, Thursday, Saturday, Sunday. Take 1-1/2 tablets on Monday, Wednesday, Friday

## 2016-02-20 ENCOUNTER — Other Ambulatory Visit: Payer: Self-pay | Admitting: Family Medicine

## 2016-03-08 ENCOUNTER — Encounter: Payer: Self-pay | Admitting: Family Medicine

## 2016-03-08 ENCOUNTER — Ambulatory Visit (INDEPENDENT_AMBULATORY_CARE_PROVIDER_SITE_OTHER): Payer: Medicare Other | Admitting: Family Medicine

## 2016-03-08 VITALS — BP 132/78 | HR 70 | Temp 98.9°F | Resp 14 | Ht 65.0 in | Wt 209.0 lb

## 2016-03-08 DIAGNOSIS — J209 Acute bronchitis, unspecified: Secondary | ICD-10-CM | POA: Diagnosis not present

## 2016-03-08 DIAGNOSIS — I6523 Occlusion and stenosis of bilateral carotid arteries: Secondary | ICD-10-CM | POA: Diagnosis not present

## 2016-03-08 DIAGNOSIS — I480 Paroxysmal atrial fibrillation: Secondary | ICD-10-CM | POA: Diagnosis not present

## 2016-03-08 DIAGNOSIS — Z8673 Personal history of transient ischemic attack (TIA), and cerebral infarction without residual deficits: Secondary | ICD-10-CM | POA: Diagnosis not present

## 2016-03-08 LAB — PT WITH INR/FINGERSTICK
INR, fingerstick: 2.3 — ABNORMAL HIGH (ref 0.80–1.20)
PT, fingerstick: 27.9 seconds — ABNORMAL HIGH (ref 10.4–12.5)

## 2016-03-08 MED ORDER — GUAIFENESIN-CODEINE 100-10 MG/5ML PO SOLN: 5.0000 mL | Freq: Four times a day (QID) | ORAL | 0 refills | Status: DC | PRN

## 2016-03-08 MED ORDER — AMOXICILLIN 875 MG PO TABS: 875.0000 mg | ORAL_TABLET | Freq: Two times a day (BID) | ORAL | 0 refills | Status: DC

## 2016-03-08 MED ORDER — ALBUTEROL SULFATE HFA 108 (90 BASE) MCG/ACT IN AERS: 2.0000 | INHALATION_SPRAY | RESPIRATORY_TRACT | 0 refills | Status: DC | PRN

## 2016-03-08 NOTE — Patient Instructions (Addendum)
Take amoxicillin as prescribed Use the inhaler as prescribed Cough medicine with codiene, if too expensive use robitussin  F/U 4 weeks for Coumadin check [name]

## 2016-03-08 NOTE — Assessment & Plan Note (Signed)
INR at goal, discussed with his PCP who is managing, he recommended repeat INR in 4 weeks No change to dose

## 2016-03-08 NOTE — Progress Notes (Signed)
   Subjective:    Patient ID: [name], male    DOB: [birth year], 82 y.o.   MRN: [id number]  Patient presents for PT/INR (Coumadin 11.25 MWF/ Coumadin 7.5 TThSS) and Illness (x3 days- productive cough with brown colored mucus, low grade fever at night, chest congestion)  Patient here with a cough with production low-grade fever wheezing is been going on for the past 3-4 days. Initially started with a head cold. He is a nonsmoker. He has been using Mucinex/Robitussin with minimal improvement. At times he has coughing fits and then he starts to feel short of breath   He's here for Coumadin check secondary to paroxysmal atrial fibrillation as well as his cardiac disease. INR 2.3 today  Denies chest pain   Review Of Systems:  GEN- denies fatigue, +fever, weight loss,weakness, recent illness HEENT- denies eye drainage, change in vision,+ nasal discharge, CVS- denies chest pain, palpitations RESP- +SOB, +cough,+ wheeze ABD- denies N/V, change in stools, abd pain GU- denies dysuria, hematuria, dribbling, incontinence MSK- denies joint pain, muscle aches, injury Neuro- denies headache, dizziness, syncope, seizure activity       Objective:    BP 132/78 (BP Location: Left Arm, Patient Position: Sitting, Cuff Size: Large)   Pulse 70   Temp 98.9 F (37.2 C) (Oral)   Resp 14   Ht 5\' 5"  (1.651 m)   Wt 209 lb (94.8 kg)   SpO2 98%   BMI 34.78 kg/m  GEN- NAD, alert and oriented x3 HEENT- PERRL, EOMI, non injected sclera, pink conjunctiva, MMM, oropharynx clear  TM clear bilat no effusion,  No  maxillary sinus tenderness, inflammed turbinates,  Nasal drainage  Neck- Supple, no LAD CVS- RRR, no murmur RESP- occasional wheeze cleared with cough, mild congestion, normal WOB, no retractions, no rales  EXT- No edema Pulses- Radial 2+         Assessment & Plan:      Problem List Items Addressed This Visit    Paroxysmal atrial fibrillation (HCC)    INR at goal, discussed with his  PCP who is managing, he recommended repeat INR in 4 weeks No change to dose        Other Visit Diagnoses    Acute bronchitis, unspecified organism    -  Primary   Treat with amox high dose, due to his coumadin, given albuterol, robitussin codiene   History of CVA (cerebrovascular accident)          Note: This dictation was prepared with Dragon dictation along with smaller phrase technology. Any transcriptional errors that result from this process are unintentional.

## 2016-03-12 ENCOUNTER — Ambulatory Visit (INDEPENDENT_AMBULATORY_CARE_PROVIDER_SITE_OTHER): Payer: Medicare Other | Admitting: Family Medicine

## 2016-03-12 ENCOUNTER — Ambulatory Visit: Payer: Medicare Other | Admitting: *Deleted

## 2016-03-12 ENCOUNTER — Ambulatory Visit
Admission: RE | Admit: 2016-03-12 | Discharge: 2016-03-12 | Disposition: A | Discharge status: Home / Self Care | Payer: Medicare Other | Source: Ambulatory Visit | Attending: Family Medicine | Admitting: Family Medicine

## 2016-03-12 VITALS — BP 162/104 | HR 92 | Temp 98.1°F

## 2016-03-12 VITALS — BP 144/78 | HR 92 | Temp 98.1°F | Resp 18

## 2016-03-12 DIAGNOSIS — R0602 Shortness of breath: Secondary | ICD-10-CM | POA: Diagnosis not present

## 2016-03-12 DIAGNOSIS — J069 Acute upper respiratory infection, unspecified: Secondary | ICD-10-CM

## 2016-03-12 DIAGNOSIS — J189 Pneumonia, unspecified organism: Secondary | ICD-10-CM

## 2016-03-12 DIAGNOSIS — R05 Cough: Secondary | ICD-10-CM | POA: Diagnosis not present

## 2016-03-12 DIAGNOSIS — I6523 Occlusion and stenosis of bilateral carotid arteries: Secondary | ICD-10-CM

## 2016-03-12 MED ORDER — LEVOFLOXACIN 500 MG PO TABS: 500.0000 mg | ORAL_TABLET | Freq: Every day | ORAL | 0 refills | Status: DC

## 2016-03-12 MED ORDER — LEVOFLOXACIN 500 MG PO TABS
500.0000 mg | ORAL_TABLET | Freq: Every day | ORAL | 0 refills | Status: DC
Start: 2016-03-12 — End: 2016-03-12

## 2016-03-12 NOTE — Progress Notes (Signed)
Subjective:    Patient ID: [name], male    DOB: 1933/12/21, 82 y.o.   MRN: [id number]  HPI Patient was seen earlier in the week and was given amoxicillin for bronchitis. Over the week, his cough has steadily worsened. It is productive of green sputum. On examination today, he has faint expiratory wheezes and bilateral rhonchorous breath sounds with rails most pronounced at the left base. He is also been running a fever to 101 throughout the week. He is on Coumadin. He has 7.5 mg tablets. He takes 1-1/2 tablets on Monday, Wednesday, Friday. He takes 1 tablet on Tuesday, Thursday, Saturday, and Sunday. Check was therapeutic. Past Medical History:  Diagnosis Date  . Atrial flutter ([place])   . BPH (benign prostatic hyperplasia)   . Coronary artery disease    a. s/p MI tx with Cypher DES to pRCA in 4/04;  b. Echocardiogram 7/09: Normal LV function.  c. Nuclear study 3/13 no ischemia;  d. ETT 2/14 neg;  e. admx with CP => LHC (01/30/2013):  pLAD 40-50%, oD1 70-80%, oOM1 40%, pRCA stent patent, mid RCA 30%, EF 60-65%. => med Rx.  . DDD (degenerative disc disease)   . Diabetes mellitus without complication ([place])   . Dyslipidemia   . Hemorrhoids   . Hx MRSA infection    left buttocks abscess  . Hx of echocardiogram    a. Echocardiogram (01/31/2013): Mild focal basal and mild concentric hypertrophy of the septum, EF 50-55%, normal wall motion, grade 1 diastolic dysfunction, trivial AI, MAC, mild LAE, PASP 35  . Hyperlipidemia   . Hypertension   . Hypothyroidism   . Meniere's disease    Status post shunt  . Myocardial infarction 2008  . Occlusion and stenosis of carotid artery without mention of cerebral infarction    40-59% on carotid doppler 2014; [place] (01/2013): R 1-39%, L 60-79%  . Rotator cuff injury    chronic rotator cuff injury status post repair  . Seizure disorder ([place])   . Stroke [facility])    a. 01/2013=> post cardiac cath CVA to L post communicating artery system; R sided weakness   . Syncope    Past Surgical History:  Procedure Laterality Date  . COLONOSCOPY    . CORONARY ANGIOPLASTY WITH STENT PLACEMENT  07/14/2002   Stent to the right coronary artery  . ENDOLYMPHATIC SHUNT DECOMPRESSION  06/11/2009    Right endolymphatic sac decompression and shunt  placement  . HERNIA REPAIR  04/10/2008   scrotal hernia repair  . LEFT HEART CATHETERIZATION WITH CORONARY ANGIOGRAM N/A 01/30/2013   Procedure: LEFT HEART CATHETERIZATION WITH CORONARY ANGIOGRAM;  Surgeon: Larey Dresser, MD;  Location: [facility] CATH LAB;  Service: Cardiovascular;  Laterality: N/A;  . ROTATOR CUFF REPAIR     for chronic rotator cuff injury   Current Outpatient Prescriptions on File Prior to Visit  Medication Sig Dispense Refill  . albuterol (PROVENTIL HFA;VENTOLIN HFA) 108 (90 Base) MCG/ACT inhaler Inhale 2 puffs into the lungs every 4 (four) hours as needed for wheezing or shortness of breath. 1 Inhaler 0  . amoxicillin (AMOXIL) 875 MG tablet Take 1 tablet (875 mg total) by mouth 2 (two) times daily. 14 tablet 0  . atorvastatin (LIPITOR) 80 MG tablet TAKE 1 TABLET BY MOUTH EVERY DAY (Patient taking differently: Take 80 mg by mouth once a day) 90 tablet 3  . carbamazepine (CARBATROL) 300 MG 12 hr capsule Take 1 capsule (300 mg total) by mouth 2 (two) times daily. 180 capsule  3  . cloNIDine (CATAPRES) 0.2 MG tablet TAKE 1 TABLET BY MOUTH TWICE A DAY 60 tablet 0  . furosemide (LASIX) 20 MG tablet TAKE 1 TABLET BY MOUTH EVERY DAY 30 tablet 0  . guaiFENesin-codeine 100-10 MG/5ML syrup Take 5 mLs by mouth every 6 (six) hours as needed. (Patient not taking: Reported on 03/12/2016) 120 mL 0  . isosorbide mononitrate (IMDUR) 30 MG 24 hr tablet TAKE 1 TABLET BY MOUTH EVERY DAY 90 tablet 1  . levothyroxine (SYNTHROID, LEVOTHROID) 75 MCG tablet Take 1 tablet (75 mcg total) by mouth daily. 90 tablet 3  . losartan (COZAAR) 100 MG tablet TAKE 1/2 TABLET BY MOUTH 2 TIMES A DAY (Patient taking differently: Take 50 mg by  mouth two times a day) 30 tablet 1  . metFORMIN (GLUCOPHAGE) 500 MG tablet TAKE 1 TABLET TWICE A DAY WITH A MEAL (Patient taking differently: Take 500 mg by mouth 2 (two) times daily. ) 180 tablet 3  . metoprolol (LOPRESSOR) 100 MG tablet TAKE 1 TABLET BY MOUTH TWICE A DAY 60 tablet 0  . Multiple Vitamins-Minerals (MULTIVITAMINS THER. W/MINERALS) TABS Take 1 tablet by mouth at bedtime.      . pantoprazole (PROTONIX) 40 MG tablet Take 1 tablet (40 mg total) by mouth daily. 30 tablet 0  . warfarin (COUMADIN) 7.5 MG tablet TAKE AS DIRECTED (Patient taking differently: TAKE AS DIRECTED (1.5 tabs PO MWF/ 1 tab PO TThSS)) 60 tablet 11   No current facility-administered medications on file prior to visit.    No Known Allergies Social History   Social History  . Marital status: Married    Spouse name: N/A  . Number of children: 4  . Years of education: 12th   Occupational History  . Retired    Social History Main Topics  . Smoking status: Former Smoker    Quit date: 08/19/1961  . Smokeless tobacco: Former Systems developer    Types: Chew  . Alcohol use No  . Drug use: No  . Sexual activity: No   Other Topics Concern  . Not on file   Social History Narrative   Lives with wife.      Review of Systems  All other systems reviewed and are negative.      Objective:   Physical Exam  Constitutional: He appears well-developed.  HENT:  Right Ear: External ear normal.  Left Ear: External ear normal.  Nose: Nose normal.  Mouth/Throat: Oropharynx is clear and moist.  Eyes: Conjunctivae are normal.  Neck: No JVD present.  Cardiovascular: Normal rate.  An irregularly irregular rhythm present.  Pulmonary/Chest: No respiratory distress. He has wheezes in the right upper field, the right lower field, the left upper field and the left lower field. He has rhonchi in the right lower field and the left lower field. He has rales in the left lower field.  Lymphadenopathy:    He has no cervical  adenopathy.  Vitals reviewed.         Assessment & Plan:  Walking pneumonia - Plan: DG Chest 2 View, levofloxacin (LEVAQUIN) 500 MG tablet, DISCONTINUED: levofloxacin (LEVAQUIN) 500 MG tablet, DISCONTINUED: levofloxacin (LEVAQUIN) 500 MG tablet  I believe the patient may be developing walking pneumonia. Begin Levaquin 500 mg by mouth daily for 7 days. Discontinue amoxicillin. Decrease Coumadin dose to 7.5 mg a day as the Levaquin will likely increase his INR. Recheck his INR and the patient on Wednesday. Seek medical attention immediately should symptoms worsen

## 2016-03-19 ENCOUNTER — Other Ambulatory Visit: Payer: Self-pay | Admitting: Family Medicine

## 2016-03-23 ENCOUNTER — Telehealth: Payer: Self-pay | Admitting: Family Medicine

## 2016-03-23 MED ORDER — DIAZEPAM 2 MG PO TABS: 2.0000 mg | ORAL_TABLET | Freq: Two times a day (BID) | ORAL | 2 refills | Status: DC

## 2016-03-23 NOTE — Telephone Encounter (Signed)
Called and spoke to daughter and she states that the hosp stopped his valium and would like to have him restart this - Ok to refill??      -  They have an appt with you on Friday but he thinks he has had another small stroke - he is having a lot of anxiety, memory issues and acting not quite like himself. Daughter is going to try to get off work to be here for the appt.

## 2016-03-23 NOTE — Telephone Encounter (Signed)
I am okay with him resuming his Valium

## 2016-03-23 NOTE — Telephone Encounter (Signed)
Patient has appt on Friday, however his daughter [name] calling with questions about his illness, and maybe getting meds until that appointment  Please call her at [phone]

## 2016-03-23 NOTE — Telephone Encounter (Signed)
Med called to pharm and dtr witll ck with pharm for refill

## 2016-03-26 ENCOUNTER — Ambulatory Visit (INDEPENDENT_AMBULATORY_CARE_PROVIDER_SITE_OTHER): Payer: Medicare Other | Admitting: Family Medicine

## 2016-03-26 ENCOUNTER — Encounter: Payer: Self-pay | Admitting: Family Medicine

## 2016-03-26 VITALS — BP 118/70 | HR 80 | Temp 97.6°F | Resp 18 | Ht 65.0 in | Wt 213.0 lb

## 2016-03-26 DIAGNOSIS — J189 Pneumonia, unspecified organism: Secondary | ICD-10-CM

## 2016-03-26 NOTE — Progress Notes (Signed)
Subjective:    Patient ID: [name], male    DOB: June 17, 1933, 83 y.o.   MRN: [id number]  HPI  03/12/16 Patient was seen earlier in the week and was given amoxicillin for bronchitis. Over the week, his cough has steadily worsened. It is productive of green sputum. On examination today, he has faint expiratory wheezes and bilateral rhonchorous breath sounds with rales most pronounced at the left base. He is also been running a fever to 101 throughout the week. He is on Coumadin. He has 7.5 mg tablets. He takes 1-1/2 tablets on Monday, Wednesday, Friday. He takes 1 tablet on Tuesday, Thursday, Saturday, and Sunday. Check was therapeutic.  At that time, my plan was: I believe the patient may be developing walking pneumonia. Begin Levaquin 500 mg by mouth daily for 7 days. Discontinue amoxicillin. Decrease Coumadin dose to 7.5 mg a day as the Levaquin will likely increase his INR. Recheck his INR and the patient on Wednesday. Seek medical attention immediately should symptoms worsen  03/26/16 Patient states that he feels much better. His lungs are on examination today with no expiratory wheezes. Rhonchi and rails have resolved. Otherwise the patient states he feels fine. He denies any chest pain or shortness of breath. He denies any weakness or fever. Past Medical History:  Diagnosis Date  . Atrial flutter ([place])   . BPH (benign prostatic hyperplasia)   . Coronary artery disease    a. s/p MI tx with Cypher DES to pRCA in 4/04;  b. Echocardiogram 7/09: Normal LV function.  c. Nuclear study 3/13 no ischemia;  d. ETT 2/14 neg;  e. admx with CP => LHC (01/30/2013):  pLAD 40-50%, oD1 70-80%, oOM1 40%, pRCA stent patent, mid RCA 30%, EF 60-65%. => med Rx.  . DDD (degenerative disc disease)   . Diabetes mellitus without complication ([place])   . Dyslipidemia   . Hemorrhoids   . Hx MRSA infection    left buttocks abscess  . Hx of echocardiogram    a. Echocardiogram (01/31/2013): Mild focal basal and  mild concentric hypertrophy of the septum, EF 50-55%, normal wall motion, grade 1 diastolic dysfunction, trivial AI, MAC, mild LAE, PASP 35  . Hyperlipidemia   . Hypertension   . Hypothyroidism   . Meniere's disease    Status post shunt  . Myocardial infarction 2008  . Occlusion and stenosis of carotid artery without mention of cerebral infarction    40-59% on carotid doppler 2014; [place] (01/2013): R 1-39%, L 60-79%  . Rotator cuff injury    chronic rotator cuff injury status post repair  . Seizure disorder ([place])   . Stroke [facility])    a. 01/2013=> post cardiac cath CVA to L post communicating artery system; R sided weakness  . Syncope    Past Surgical History:  Procedure Laterality Date  . COLONOSCOPY    . CORONARY ANGIOPLASTY WITH STENT PLACEMENT  07/14/2002   Stent to the right coronary artery  . ENDOLYMPHATIC SHUNT DECOMPRESSION  06/11/2009    Right endolymphatic sac decompression and shunt  placement  . HERNIA REPAIR  04/10/2008   scrotal hernia repair  . LEFT HEART CATHETERIZATION WITH CORONARY ANGIOGRAM N/A 01/30/2013   Procedure: LEFT HEART CATHETERIZATION WITH CORONARY ANGIOGRAM;  Surgeon: Larey Dresser, MD;  Location: [facility] CATH LAB;  Service: Cardiovascular;  Laterality: N/A;  . ROTATOR CUFF REPAIR     for chronic rotator cuff injury   Current Outpatient Prescriptions on File Prior to Visit  Medication Sig  Dispense Refill  . albuterol (PROVENTIL HFA;VENTOLIN HFA) 108 (90 Base) MCG/ACT inhaler Inhale 2 puffs into the lungs every 4 (four) hours as needed for wheezing or shortness of breath. 1 Inhaler 0  . atorvastatin (LIPITOR) 80 MG tablet TAKE 1 TABLET BY MOUTH EVERY DAY (Patient taking differently: Take 80 mg by mouth once a day) 90 tablet 3  . carbamazepine (CARBATROL) 300 MG 12 hr capsule Take 1 capsule (300 mg total) by mouth 2 (two) times daily. 180 capsule 3  . cloNIDine (CATAPRES) 0.2 MG tablet TAKE 1 TABLET BY MOUTH TWICE A DAY 60 tablet 11  . diazepam (VALIUM) 2 MG  tablet Take 1 tablet (2 mg total) by mouth 2 (two) times daily. Take 2 mg by mouth two times a day 60 tablet 2  . furosemide (LASIX) 20 MG tablet TAKE 1 TABLET BY MOUTH EVERY DAY 30 tablet 11  . guaiFENesin-codeine 100-10 MG/5ML syrup Take 5 mLs by mouth every 6 (six) hours as needed. 120 mL 0  . isosorbide mononitrate (IMDUR) 30 MG 24 hr tablet TAKE 1 TABLET BY MOUTH EVERY DAY 90 tablet 1  . levofloxacin (LEVAQUIN) 500 MG tablet Take 1 tablet (500 mg total) by mouth daily. 7 tablet 0  . levothyroxine (SYNTHROID, LEVOTHROID) 75 MCG tablet Take 1 tablet (75 mcg total) by mouth daily. 90 tablet 3  . losartan (COZAAR) 100 MG tablet TAKE 1/2 TABLET BY MOUTH 2 TIMES A DAY (Patient taking differently: Take 50 mg by mouth two times a day) 30 tablet 1  . metFORMIN (GLUCOPHAGE) 500 MG tablet TAKE 1 TABLET TWICE A DAY WITH A MEAL (Patient taking differently: Take 500 mg by mouth 2 (two) times daily. ) 180 tablet 3  . metoprolol (LOPRESSOR) 100 MG tablet TAKE 1 TABLET BY MOUTH TWICE A DAY 60 tablet 11  . Multiple Vitamins-Minerals (MULTIVITAMINS THER. W/MINERALS) TABS Take 1 tablet by mouth at bedtime.      . pantoprazole (PROTONIX) 40 MG tablet Take 1 tablet (40 mg total) by mouth daily. 30 tablet 0  . warfarin (COUMADIN) 7.5 MG tablet TAKE AS DIRECTED (Patient taking differently: TAKE AS DIRECTED (1.5 tabs PO MWF/ 1 tab PO TThSS)) 60 tablet 11   No current facility-administered medications on file prior to visit.    No Known Allergies Social History   Social History  . Marital status: Married    Spouse name: N/A  . Number of children: 4  . Years of education: 12th   Occupational History  . Retired    Social History Main Topics  . Smoking status: Former Smoker    Quit date: 08/19/1961  . Smokeless tobacco: Former Systems developer    Types: Chew  . Alcohol use No  . Drug use: No  . Sexual activity: No   Other Topics Concern  . Not on file   Social History Narrative   Lives with wife.       Review of Systems  All other systems reviewed and are negative.      Objective:   Physical Exam  Constitutional: He appears well-developed.  HENT:  Right Ear: External ear normal.  Left Ear: External ear normal.  Nose: Nose normal.  Mouth/Throat: Oropharynx is clear and moist.  Eyes: Conjunctivae are normal.  Neck: No JVD present.  Cardiovascular: Normal rate.  An irregularly irregular rhythm present.  Pulmonary/Chest: No respiratory distress. He has no wheezes. He has no rhonchi. He has no rales.  Lymphadenopathy:    He has no cervical adenopathy.  Vitals reviewed.         Assessment & Plan:  Walking pneumonia Clinically the patient is much better. No further antibiotics are required. Tincture of time should resolve all of his upper respiratory symptoms resume previous Coumadin schedule

## 2016-04-01 ENCOUNTER — Ambulatory Visit (INDEPENDENT_AMBULATORY_CARE_PROVIDER_SITE_OTHER): Payer: Medicare Other | Admitting: Family Medicine

## 2016-04-01 ENCOUNTER — Encounter: Payer: Self-pay | Admitting: Family Medicine

## 2016-04-01 DIAGNOSIS — Z8673 Personal history of transient ischemic attack (TIA), and cerebral infarction without residual deficits: Secondary | ICD-10-CM

## 2016-04-01 DIAGNOSIS — I480 Paroxysmal atrial fibrillation: Secondary | ICD-10-CM

## 2016-04-01 LAB — PT WITH INR/FINGERSTICK
INR, fingerstick: 1.5 — ABNORMAL HIGH (ref 0.80–1.20)
PT, fingerstick: 17.7 seconds — ABNORMAL HIGH (ref 10.4–12.5)

## 2016-04-01 NOTE — Progress Notes (Signed)
Subjective:    Patient ID: [name], male    DOB: [birth year], 83 y.o.   MRN: [id number]  HPI  03/12/16 Patient was seen earlier in the week and was given amoxicillin for bronchitis. Over the week, his cough has steadily worsened. It is productive of green sputum. On examination today, he has faint expiratory wheezes and bilateral rhonchorous breath sounds with rales most pronounced at the left base. He is also been running a fever to 101 throughout the week. He is on Coumadin. He has 7.5 mg tablets. He takes 1-1/2 tablets on Monday, Wednesday, Friday. He takes 1 tablet on Tuesday, Thursday, Saturday, and Sunday. Check was therapeutic.  At that time, my plan was: I believe the patient may be developing walking pneumonia. Begin Levaquin 500 mg by mouth daily for 7 days. Discontinue amoxicillin. Decrease Coumadin dose to 7.5 mg a day as the Levaquin will likely increase his INR. Recheck his INR and the patient on Wednesday. Seek medical attention immediately should symptoms worsen  03/26/16 Patient states that he feels much better. His lungs are on examination today with no expiratory wheezes. Rhonchi and rails have resolved. Otherwise the patient states he feels fine. He denies any chest pain or shortness of breath. He denies any weakness or fever.  At that time, my plan was: Clinically the patient is much better. No further antibiotics are required. Tincture of time should resolve all of his upper respiratory symptoms resume previous Coumadin schedule  04/01/16 INR today is 1.5. Patient is taking Coumadin 7.5 mg daily. Symptoms have completely resolved. On the schedule it lists that he is still sick however he denies every symptom other than some mild rhinorrhea. Past Medical History:  Diagnosis Date  . Atrial flutter ([place])   . BPH (benign prostatic hyperplasia)   . Coronary artery disease    a. s/p MI tx with Cypher DES to pRCA in 4/04;  b. Echocardiogram 7/09: Normal LV function.  c.  Nuclear study 3/13 no ischemia;  d. ETT 2/14 neg;  e. admx with CP => LHC (01/30/2013):  pLAD 40-50%, oD1 70-80%, oOM1 40%, pRCA stent patent, mid RCA 30%, EF 60-65%. => med Rx.  . DDD (degenerative disc disease)   . Diabetes mellitus without complication ([place])   . Dyslipidemia   . Hemorrhoids   . Hx MRSA infection    left buttocks abscess  . Hx of echocardiogram    a. Echocardiogram (01/31/2013): Mild focal basal and mild concentric hypertrophy of the septum, EF 50-55%, normal wall motion, grade 1 diastolic dysfunction, trivial AI, MAC, mild LAE, PASP 35  . Hyperlipidemia   . Hypertension   . Hypothyroidism   . Meniere's disease    Status post shunt  . Myocardial infarction 2008  . Occlusion and stenosis of carotid artery without mention of cerebral infarction    40-59% on carotid doppler 2014; [place] (01/2013): R 1-39%, L 60-79%  . Rotator cuff injury    chronic rotator cuff injury status post repair  . Seizure disorder ([place])   . Stroke [facility])    a. 01/2013=> post cardiac cath CVA to L post communicating artery system; R sided weakness  . Syncope    Past Surgical History:  Procedure Laterality Date  . COLONOSCOPY    . CORONARY ANGIOPLASTY WITH STENT PLACEMENT  07/14/2002   Stent to the right coronary artery  . ENDOLYMPHATIC SHUNT DECOMPRESSION  06/11/2009    Right endolymphatic sac decompression and shunt  placement  . HERNIA REPAIR  04/10/2008   scrotal hernia repair  . LEFT HEART CATHETERIZATION WITH CORONARY ANGIOGRAM N/A 01/30/2013   Procedure: LEFT HEART CATHETERIZATION WITH CORONARY ANGIOGRAM;  Surgeon: Larey Dresser, MD;  Location: [facility] CATH LAB;  Service: Cardiovascular;  Laterality: N/A;  . ROTATOR CUFF REPAIR     for chronic rotator cuff injury   Current Outpatient Prescriptions on File Prior to Visit  Medication Sig Dispense Refill  . albuterol (PROVENTIL HFA;VENTOLIN HFA) 108 (90 Base) MCG/ACT inhaler Inhale 2 puffs into the lungs every 4 (four) hours as needed for  wheezing or shortness of breath. 1 Inhaler 0  . atorvastatin (LIPITOR) 80 MG tablet TAKE 1 TABLET BY MOUTH EVERY DAY (Patient taking differently: Take 80 mg by mouth once a day) 90 tablet 3  . carbamazepine (CARBATROL) 300 MG 12 hr capsule Take 1 capsule (300 mg total) by mouth 2 (two) times daily. 180 capsule 3  . cloNIDine (CATAPRES) 0.2 MG tablet TAKE 1 TABLET BY MOUTH TWICE A DAY 60 tablet 11  . diazepam (VALIUM) 2 MG tablet Take 1 tablet (2 mg total) by mouth 2 (two) times daily. Take 2 mg by mouth two times a day 60 tablet 2  . furosemide (LASIX) 20 MG tablet TAKE 1 TABLET BY MOUTH EVERY DAY 30 tablet 11  . guaiFENesin-codeine 100-10 MG/5ML syrup Take 5 mLs by mouth every 6 (six) hours as needed. 120 mL 0  . isosorbide mononitrate (IMDUR) 30 MG 24 hr tablet TAKE 1 TABLET BY MOUTH EVERY DAY 90 tablet 1  . levothyroxine (SYNTHROID, LEVOTHROID) 75 MCG tablet Take 1 tablet (75 mcg total) by mouth daily. 90 tablet 3  . losartan (COZAAR) 100 MG tablet TAKE 1/2 TABLET BY MOUTH 2 TIMES A DAY (Patient taking differently: Take 50 mg by mouth two times a day) 30 tablet 1  . metFORMIN (GLUCOPHAGE) 500 MG tablet TAKE 1 TABLET TWICE A DAY WITH A MEAL (Patient taking differently: Take 500 mg by mouth 2 (two) times daily. ) 180 tablet 3  . metoprolol (LOPRESSOR) 100 MG tablet TAKE 1 TABLET BY MOUTH TWICE A DAY 60 tablet 11  . Multiple Vitamins-Minerals (MULTIVITAMINS THER. W/MINERALS) TABS Take 1 tablet by mouth at bedtime.      . pantoprazole (PROTONIX) 40 MG tablet Take 1 tablet (40 mg total) by mouth daily. 30 tablet 0  . warfarin (COUMADIN) 7.5 MG tablet TAKE AS DIRECTED (Patient taking differently: 1 tab po qd) 60 tablet 11   No current facility-administered medications on file prior to visit.    No Known Allergies Social History   Social History  . Marital status: Married    Spouse name: N/A  . Number of children: 4  . Years of education: 12th   Occupational History  . Retired    Social  History Main Topics  . Smoking status: Former Smoker    Quit date: 08/19/1961  . Smokeless tobacco: Former Systems developer    Types: Chew  . Alcohol use No  . Drug use: No  . Sexual activity: No   Other Topics Concern  . Not on file   Social History Narrative   Lives with wife.      Review of Systems  All other systems reviewed and are negative.      Objective:   Physical Exam  Constitutional: He appears well-developed.  HENT:  Right Ear: External ear normal.  Left Ear: External ear normal.  Nose: Nose normal.  Mouth/Throat: Oropharynx is clear and moist.  Eyes: Conjunctivae are  normal.  Neck: No JVD present.  Cardiovascular: Normal rate.  An irregularly irregular rhythm present.  Pulmonary/Chest: No respiratory distress. He has no wheezes. He has no rhonchi. He has no rales.  Lymphadenopathy:    He has no cervical adenopathy.  Vitals reviewed.         Assessment & Plan:  History of CVA (cerebrovascular accident) - Plan: PT with INR/Fingerstick  Paroxysmal atrial fibrillation ([place]) - Plan: PT with INR/Fingerstick  Change Coumadin to 7.5 mg every day except Monday Wednesday Friday. On Monday Wednesday Friday he is to take 1-1/2 tablets which equates to 11.25 mg thereabout. Recheck INR in 2 weeks

## 2016-04-15 ENCOUNTER — Ambulatory Visit (INDEPENDENT_AMBULATORY_CARE_PROVIDER_SITE_OTHER): Payer: Medicare Other | Admitting: Family Medicine

## 2016-04-15 ENCOUNTER — Encounter: Payer: Self-pay | Admitting: Family Medicine

## 2016-04-15 DIAGNOSIS — I480 Paroxysmal atrial fibrillation: Secondary | ICD-10-CM | POA: Diagnosis not present

## 2016-04-15 DIAGNOSIS — Z8673 Personal history of transient ischemic attack (TIA), and cerebral infarction without residual deficits: Secondary | ICD-10-CM | POA: Diagnosis not present

## 2016-04-15 LAB — PT WITH INR/FINGERSTICK
INR, fingerstick: 1.9 — ABNORMAL HIGH (ref 0.80–1.20)
PT, fingerstick: 23.3 seconds — ABNORMAL HIGH (ref 10.4–12.5)

## 2016-04-15 NOTE — Progress Notes (Signed)
Subjective:    Patient ID: [name], male    DOB: 26-Oct-1933, 83 y.o.   MRN: [id number]  HPI  03/12/16 Patient was seen earlier in the week and was given amoxicillin for bronchitis. Over the week, his cough has steadily worsened. It is productive of green sputum. On examination today, he has faint expiratory wheezes and bilateral rhonchorous breath sounds with rales most pronounced at the left base. He is also been running a fever to 101 throughout the week. He is on Coumadin. He has 7.5 mg tablets. He takes 1-1/2 tablets on Monday, Wednesday, Friday. He takes 1 tablet on Tuesday, Thursday, Saturday, and Sunday. Check was therapeutic.  At that time, my plan was: I believe the patient may be developing walking pneumonia. Begin Levaquin 500 mg by mouth daily for 7 days. Discontinue amoxicillin. Decrease Coumadin dose to 7.5 mg a day as the Levaquin will likely increase his INR. Recheck his INR and the patient on Wednesday. Seek medical attention immediately should symptoms worsen  03/26/16 Patient states that he feels much better. His lungs are on examination today with no expiratory wheezes. Rhonchi and rails have resolved. Otherwise the patient states he feels fine. He denies any chest pain or shortness of breath. He denies any weakness or fever.  At that time, my plan was: Clinically the patient is much better. No further antibiotics are required. Tincture of time should resolve all of his upper respiratory symptoms resume previous Coumadin schedule  04/01/16 INR today is 1.5. Patient is taking Coumadin 7.5 mg daily. Symptoms have completely resolved. On the schedule it lists that he is still sick however he denies every symptom other than some mild rhinorrhea.  At that time, my plan was: Change Coumadin to 7.5 mg every day except Monday Wednesday Friday. On Monday Wednesday Friday he is to take 1-1/2 tablets which equates to 11.25 mg thereabout. Recheck INR in 2 weeks  04/15/16 Date the  patient's INR is virtually therapeutic at 1.9. He has been eating quite a bit of CABG recently. Past Medical History:  Diagnosis Date  . Atrial flutter ([place])   . BPH (benign prostatic hyperplasia)   . Coronary artery disease    a. s/p MI tx with Cypher DES to pRCA in 4/04;  b. Echocardiogram 7/09: Normal LV function.  c. Nuclear study 3/13 no ischemia;  d. ETT 2/14 neg;  e. admx with CP => LHC (01/30/2013):  pLAD 40-50%, oD1 70-80%, oOM1 40%, pRCA stent patent, mid RCA 30%, EF 60-65%. => med Rx.  . DDD (degenerative disc disease)   . Diabetes mellitus without complication ([place])   . Dyslipidemia   . Hemorrhoids   . Hx MRSA infection    left buttocks abscess  . Hx of echocardiogram    a. Echocardiogram (01/31/2013): Mild focal basal and mild concentric hypertrophy of the septum, EF 50-55%, normal wall motion, grade 1 diastolic dysfunction, trivial AI, MAC, mild LAE, PASP 35  . Hyperlipidemia   . Hypertension   . Hypothyroidism   . Meniere's disease    Status post shunt  . Myocardial infarction 2008  . Occlusion and stenosis of carotid artery without mention of cerebral infarction    40-59% on carotid doppler 2014; [place] (01/2013): R 1-39%, L 60-79%  . Rotator cuff injury    chronic rotator cuff injury status post repair  . Seizure disorder ([place])   . Stroke [facility])    a. 01/2013=> post cardiac cath CVA to L post communicating artery system;  R sided weakness  . Syncope    Past Surgical History:  Procedure Laterality Date  . COLONOSCOPY    . CORONARY ANGIOPLASTY WITH STENT PLACEMENT  07/14/2002   Stent to the right coronary artery  . ENDOLYMPHATIC SHUNT DECOMPRESSION  06/11/2009    Right endolymphatic sac decompression and shunt  placement  . HERNIA REPAIR  04/10/2008   scrotal hernia repair  . LEFT HEART CATHETERIZATION WITH CORONARY ANGIOGRAM N/A 01/30/2013   Procedure: LEFT HEART CATHETERIZATION WITH CORONARY ANGIOGRAM;  Surgeon: Larey Dresser, MD;  Location: [facility] CATH LAB;  Service:  Cardiovascular;  Laterality: N/A;  . ROTATOR CUFF REPAIR     for chronic rotator cuff injury   Current Outpatient Prescriptions on File Prior to Visit  Medication Sig Dispense Refill  . albuterol (PROVENTIL HFA;VENTOLIN HFA) 108 (90 Base) MCG/ACT inhaler Inhale 2 puffs into the lungs every 4 (four) hours as needed for wheezing or shortness of breath. 1 Inhaler 0  . atorvastatin (LIPITOR) 80 MG tablet TAKE 1 TABLET BY MOUTH EVERY DAY (Patient taking differently: Take 80 mg by mouth once a day) 90 tablet 3  . carbamazepine (CARBATROL) 300 MG 12 hr capsule Take 1 capsule (300 mg total) by mouth 2 (two) times daily. 180 capsule 3  . cloNIDine (CATAPRES) 0.2 MG tablet TAKE 1 TABLET BY MOUTH TWICE A DAY 60 tablet 11  . diazepam (VALIUM) 2 MG tablet Take 1 tablet (2 mg total) by mouth 2 (two) times daily. Take 2 mg by mouth two times a day 60 tablet 2  . furosemide (LASIX) 20 MG tablet TAKE 1 TABLET BY MOUTH EVERY DAY 30 tablet 11  . guaiFENesin-codeine 100-10 MG/5ML syrup Take 5 mLs by mouth every 6 (six) hours as needed. 120 mL 0  . isosorbide mononitrate (IMDUR) 30 MG 24 hr tablet TAKE 1 TABLET BY MOUTH EVERY DAY 90 tablet 1  . levothyroxine (SYNTHROID, LEVOTHROID) 75 MCG tablet Take 1 tablet (75 mcg total) by mouth daily. 90 tablet 3  . losartan (COZAAR) 100 MG tablet TAKE 1/2 TABLET BY MOUTH 2 TIMES A DAY (Patient taking differently: Take 50 mg by mouth two times a day) 30 tablet 1  . metFORMIN (GLUCOPHAGE) 500 MG tablet TAKE 1 TABLET TWICE A DAY WITH A MEAL (Patient taking differently: Take 500 mg by mouth 2 (two) times daily. ) 180 tablet 3  . metoprolol (LOPRESSOR) 100 MG tablet TAKE 1 TABLET BY MOUTH TWICE A DAY 60 tablet 11  . Multiple Vitamins-Minerals (MULTIVITAMINS THER. W/MINERALS) TABS Take 1 tablet by mouth at bedtime.      . pantoprazole (PROTONIX) 40 MG tablet Take 1 tablet (40 mg total) by mouth daily. 30 tablet 0  . warfarin (COUMADIN) 7.5 MG tablet TAKE AS DIRECTED (Patient taking  differently: 1.5 tabs M/W/F and 1 tab all other days) 60 tablet 11   No current facility-administered medications on file prior to visit.    No Known Allergies Social History   Social History  . Marital status: Married    Spouse name: N/A  . Number of children: 4  . Years of education: 12th   Occupational History  . Retired    Social History Main Topics  . Smoking status: Former Smoker    Quit date: 08/19/1961  . Smokeless tobacco: Former Systems developer    Types: Chew  . Alcohol use No  . Drug use: No  . Sexual activity: No   Other Topics Concern  . Not on file   Social History  Narrative   Lives with wife.      Review of Systems  All other systems reviewed and are negative.      Objective:   Physical Exam  Constitutional: He appears well-developed.  HENT:  Right Ear: External ear normal.  Left Ear: External ear normal.  Nose: Nose normal.  Mouth/Throat: Oropharynx is clear and moist.  Eyes: Conjunctivae are normal.  Neck: No JVD present.  Cardiovascular: Normal rate.  An irregularly irregular rhythm present.  Pulmonary/Chest: No respiratory distress. He has no wheezes. He has no rhonchi. He has no rales.  Lymphadenopathy:    He has no cervical adenopathy.  Vitals reviewed.         Assessment & Plan:  History of CVA (cerebrovascular accident) - Plan: PT with INR/Fingerstick  Paroxysmal atrial fibrillation ([place]) - Plan: PT with INR/Fingerstick  I believe this is close enough. Continue 1-1/2 tablets on Monday Wednesday and Friday and 1 tablet on all other days and recheck INR in 6 weeks. Decrease consumption of green leafy vegetables and cabbage.

## 2016-04-27 DIAGNOSIS — L820 Inflamed seborrheic keratosis: Secondary | ICD-10-CM | POA: Diagnosis not present

## 2016-04-27 DIAGNOSIS — L570 Actinic keratosis: Secondary | ICD-10-CM | POA: Diagnosis not present

## 2016-04-27 DIAGNOSIS — Z85828 Personal history of other malignant neoplasm of skin: Secondary | ICD-10-CM | POA: Diagnosis not present

## 2016-04-27 DIAGNOSIS — L905 Scar conditions and fibrosis of skin: Secondary | ICD-10-CM | POA: Diagnosis not present

## 2016-05-27 ENCOUNTER — Ambulatory Visit: Payer: Medicare Other | Admitting: Family Medicine

## 2016-06-08 ENCOUNTER — Other Ambulatory Visit: Payer: Self-pay | Admitting: Family Medicine

## 2016-06-10 ENCOUNTER — Ambulatory Visit (INDEPENDENT_AMBULATORY_CARE_PROVIDER_SITE_OTHER): Payer: Medicare Other | Admitting: Family Medicine

## 2016-06-10 ENCOUNTER — Encounter: Payer: Self-pay | Admitting: Family Medicine

## 2016-06-10 DIAGNOSIS — Z8673 Personal history of transient ischemic attack (TIA), and cerebral infarction without residual deficits: Secondary | ICD-10-CM | POA: Diagnosis not present

## 2016-06-10 DIAGNOSIS — I480 Paroxysmal atrial fibrillation: Secondary | ICD-10-CM

## 2016-06-10 LAB — BASIC METABOLIC PANEL WITH GFR
BUN: 22 mg/dL (ref 7–25)
CO2: 21 mmol/L (ref 20–31)
Calcium: 8.9 mg/dL (ref 8.6–10.3)
Chloride: 104 mmol/L (ref 98–110)
Creat: 1.24 mg/dL — ABNORMAL HIGH (ref 0.70–1.11)
GFR, Est African American: 63 mL/min (ref >=60)
GFR, Est Non African American: 55 mL/min — ABNORMAL LOW (ref >=60)
Glucose, Bld: 113 mg/dL — ABNORMAL HIGH (ref 70–99)
Potassium: 4.4 mmol/L (ref 3.5–5.3)
Sodium: 140 mmol/L (ref 135–146)

## 2016-06-10 LAB — PT WITH INR/FINGERSTICK
INR, fingerstick: 2.4 — ABNORMAL HIGH (ref 0.80–1.20)
PT, fingerstick: 29.2 seconds — ABNORMAL HIGH (ref 10.4–12.5)

## 2016-06-10 NOTE — Progress Notes (Signed)
Subjective:    Patient ID: [name], male    DOB: [birth year], 83 y.o.   MRN: [id number]  HPI  03/12/16 Patient was seen earlier in the week and was given amoxicillin for bronchitis. Over the week, his cough has steadily worsened. It is productive of green sputum. On examination today, he has faint expiratory wheezes and bilateral rhonchorous breath sounds with rales most pronounced at the left base. He is also been running a fever to 101 throughout the week. He is on Coumadin. He has 7.5 mg tablets. He takes 1-1/2 tablets on Monday, Wednesday, Friday. He takes 1 tablet on Tuesday, Thursday, Saturday, and Sunday. Check was therapeutic.  At that time, my plan was: I believe the patient may be developing walking pneumonia. Begin Levaquin 500 mg by mouth daily for 7 days. Discontinue amoxicillin. Decrease Coumadin dose to 7.5 mg a day as the Levaquin will likely increase his INR. Recheck his INR and the patient on Wednesday. Seek medical attention immediately should symptoms worsen  03/26/16 Patient states that he feels much better. His lungs are on examination today with no expiratory wheezes. Rhonchi and rails have resolved. Otherwise the patient states he feels fine. He denies any chest pain or shortness of breath. He denies any weakness or fever.  At that time, my plan was: Clinically the patient is much better. No further antibiotics are required. Tincture of time should resolve all of his upper respiratory symptoms resume previous Coumadin schedule  04/01/16 INR today is 1.5. Patient is taking Coumadin 7.5 mg daily. Symptoms have completely resolved. On the schedule it lists that he is still sick however he denies every symptom other than some mild rhinorrhea.  At that time, my plan was: Change Coumadin to 7.5 mg every day except Monday Wednesday Friday. On Monday Wednesday Friday he is to take 1-1/2 tablets which equates to 11.25 mg thereabout. Recheck INR in 2 weeks  04/15/16 Date the  patient's INR is virtually therapeutic at 1.9. He has been eating quite a bit of CABG recently. At that time, my plan was: I believe this is close enough. Continue 1-1/2 tablets on Monday Wednesday and Friday and 1 tablet on all other days and recheck INR in 6 weeks. Decrease consumption of green leafy vegetables and cabbage.  06/10/16 Ear today to recheck his INR. INR is therapeutic at 2.4. There is no reason to change his Coumadin dose. However the patient also reports some mild dizziness. The last time he was lightheaded like this, the patient was found to be hyponatremic with a sodium level of 126 in July 2017. He feels similar to how he felt back then. He denies any ear pain. He denies any sinus pain. He denies any fevers. He denies any other neurologic deficit. He denies any ringing in the ears. He denies any vertigo Past Medical History:  Diagnosis Date  . Atrial flutter ([place])   . BPH (benign prostatic hyperplasia)   . Coronary artery disease    a. s/p MI tx with Cypher DES to pRCA in 4/04;  b. Echocardiogram 7/09: Normal LV function.  c. Nuclear study 3/13 no ischemia;  d. ETT 2/14 neg;  e. admx with CP => LHC (01/30/2013):  pLAD 40-50%, oD1 70-80%, oOM1 40%, pRCA stent patent, mid RCA 30%, EF 60-65%. => med Rx.  . DDD (degenerative disc disease)   . Diabetes mellitus without complication ([place])   . Dyslipidemia   . Hemorrhoids   . Hx MRSA infection  left buttocks abscess  . Hx of echocardiogram    a. Echocardiogram (01/31/2013): Mild focal basal and mild concentric hypertrophy of the septum, EF 50-55%, normal wall motion, grade 1 diastolic dysfunction, trivial AI, MAC, mild LAE, PASP 35  . Hyperlipidemia   . Hypertension   . Hypothyroidism   . Meniere's disease    Status post shunt  . Myocardial infarction 2008  . Occlusion and stenosis of carotid artery without mention of cerebral infarction    40-59% on carotid doppler 2014; [place] (01/2013): R 1-39%, L 60-79%  . Rotator cuff injury     chronic rotator cuff injury status post repair  . Seizure disorder ([place])   . Stroke [facility])    a. 01/2013=> post cardiac cath CVA to L post communicating artery system; R sided weakness  . Syncope    Past Surgical History:  Procedure Laterality Date  . COLONOSCOPY    . CORONARY ANGIOPLASTY WITH STENT PLACEMENT  07/14/2002   Stent to the right coronary artery  . ENDOLYMPHATIC SHUNT DECOMPRESSION  06/11/2009    Right endolymphatic sac decompression and shunt  placement  . HERNIA REPAIR  04/10/2008   scrotal hernia repair  . LEFT HEART CATHETERIZATION WITH CORONARY ANGIOGRAM N/A 01/30/2013   Procedure: LEFT HEART CATHETERIZATION WITH CORONARY ANGIOGRAM;  Surgeon: Larey Dresser, MD;  Location: [facility] CATH LAB;  Service: Cardiovascular;  Laterality: N/A;  . ROTATOR CUFF REPAIR     for chronic rotator cuff injury   Current Outpatient Prescriptions on File Prior to Visit  Medication Sig Dispense Refill  . albuterol (PROVENTIL HFA;VENTOLIN HFA) 108 (90 Base) MCG/ACT inhaler Inhale 2 puffs into the lungs every 4 (four) hours as needed for wheezing or shortness of breath. 1 Inhaler 0  . atorvastatin (LIPITOR) 80 MG tablet TAKE 1 TABLET BY MOUTH EVERY DAY (Patient taking differently: Take 80 mg by mouth once a day) 90 tablet 3  . carbamazepine (CARBATROL) 300 MG 12 hr capsule Take 1 capsule (300 mg total) by mouth 2 (two) times daily. 180 capsule 3  . cloNIDine (CATAPRES) 0.2 MG tablet TAKE 1 TABLET BY MOUTH TWICE A DAY 60 tablet 11  . diazepam (VALIUM) 2 MG tablet Take 1 tablet (2 mg total) by mouth 2 (two) times daily. Take 2 mg by mouth two times a day 60 tablet 2  . furosemide (LASIX) 20 MG tablet TAKE 1 TABLET BY MOUTH EVERY DAY 30 tablet 11  . guaiFENesin-codeine 100-10 MG/5ML syrup Take 5 mLs by mouth every 6 (six) hours as needed. 120 mL 0  . isosorbide mononitrate (IMDUR) 30 MG 24 hr tablet TAKE 1 TABLET BY MOUTH EVERY DAY 90 tablet 1  . levothyroxine (SYNTHROID, LEVOTHROID) 75 MCG tablet  Take 1 tablet (75 mcg total) by mouth daily. 90 tablet 3  . losartan (COZAAR) 100 MG tablet TAKE 1/2 TABLET BY MOUTH 2 TIMES A DAY 30 tablet 11  . metFORMIN (GLUCOPHAGE) 500 MG tablet TAKE 1 TABLET TWICE A DAY WITH A MEAL (Patient taking differently: Take 500 mg by mouth 2 (two) times daily. ) 180 tablet 3  . metoprolol (LOPRESSOR) 100 MG tablet TAKE 1 TABLET BY MOUTH TWICE A DAY 60 tablet 11  . Multiple Vitamins-Minerals (MULTIVITAMINS THER. W/MINERALS) TABS Take 1 tablet by mouth at bedtime.      . pantoprazole (PROTONIX) 40 MG tablet Take 1 tablet (40 mg total) by mouth daily. 30 tablet 0  . warfarin (COUMADIN) 7.5 MG tablet TAKE AS DIRECTED (Patient taking differently: 1.5 tabs  M/W/F and 1 tab all other days) 60 tablet 11   No current facility-administered medications on file prior to visit.    No Known Allergies Social History   Social History  . Marital status: Married    Spouse name: N/A  . Number of children: 4  . Years of education: 12th   Occupational History  . Retired    Social History Main Topics  . Smoking status: Former Smoker    Quit date: 08/19/1961  . Smokeless tobacco: Former Systems developer    Types: Chew  . Alcohol use No  . Drug use: No  . Sexual activity: No   Other Topics Concern  . Not on file   Social History Narrative   Lives with wife.      Review of Systems  All other systems reviewed and are negative.      Objective:   Physical Exam  Constitutional: He appears well-developed.  HENT:  Right Ear: External ear normal.  Left Ear: External ear normal.  Nose: Nose normal.  Mouth/Throat: Oropharynx is clear and moist.  Eyes: Conjunctivae are normal.  Neck: No JVD present.  Cardiovascular: Normal rate.  An irregularly irregular rhythm present.  Pulmonary/Chest: No respiratory distress. He has no wheezes. He has no rhonchi. He has no rales.  Lymphadenopathy:    He has no cervical adenopathy.  Vitals reviewed.         Assessment & Plan:    History of CVA (cerebrovascular accident) - Plan: PT with INR/Fingerstick, BASIC METABOLIC PANEL WITH GFR  Paroxysmal atrial fibrillation ([place]) - Plan: PT with INR/Fingerstick  Coumadin is therapeutic. Continue his current dose and recheck in 6 weeks. Given his dizziness, I will check a BMP to monitor his sodium level. Last time he felt this way, he was hyponatremic. His exam today is unremarkable

## 2016-07-12 ENCOUNTER — Other Ambulatory Visit: Payer: Self-pay | Admitting: Family Medicine

## 2016-07-13 DIAGNOSIS — H6123 Impacted cerumen, bilateral: Secondary | ICD-10-CM | POA: Diagnosis not present

## 2016-07-26 ENCOUNTER — Other Ambulatory Visit: Payer: Self-pay | Admitting: Family Medicine

## 2016-07-27 DIAGNOSIS — Z85828 Personal history of other malignant neoplasm of skin: Secondary | ICD-10-CM | POA: Diagnosis not present

## 2016-07-27 DIAGNOSIS — L821 Other seborrheic keratosis: Secondary | ICD-10-CM | POA: Diagnosis not present

## 2016-07-27 DIAGNOSIS — D225 Melanocytic nevi of trunk: Secondary | ICD-10-CM | POA: Diagnosis not present

## 2016-07-27 DIAGNOSIS — L570 Actinic keratosis: Secondary | ICD-10-CM | POA: Diagnosis not present

## 2016-07-27 DIAGNOSIS — D1801 Hemangioma of skin and subcutaneous tissue: Secondary | ICD-10-CM | POA: Diagnosis not present

## 2016-08-04 NOTE — Progress Notes (Signed)
Cardiology Office Note   Date:  08/05/2016   ID:  JAQUAIL MCLEES, DOB May 14, 1933, MRN [id number]  PCP:  [name], MD  Cardiologist:   Minus Breeding, MD   Chief Complaint  Patient presents with  . Coronary Artery Disease      History of Present Illness: [name] is a 83 y.o. male who presents for followup of coronary disease.  After his catheterization in November 2014 he had a CVA. This was embolic following the procedure. He has had a significant recovery from this but still has some visual deficits on his right side and right-sided arm and leg weakness.   He has atrial flutter.   Xarelto was started and his Plavix was stopped.   He was in sinus when I saw him and when he wore a Holter after that appt.  Echo was essentially unremarkable.  Since I last saw him he was in the ED with accelerated  HTN and acute diastolic CHF on 9/37. He was placed on Bipap overnight and diuresed. His medications were adjusted for B/P. Xarelto was stopped secondary to possible interaction with Tegretol.  He was switched to warfarin.  Since then he has done relatively well.  He has gained weight because he sits around and eats.  He is not very active.  The patient denies any new symptoms such as chest discomfort, neck or arm discomfort. There has been no new shortness of breath, PND or orthopnea. There have been no reported palpitations, presyncope or syncope.   Past Medical History:  Diagnosis Date  . Atrial flutter ([place])   . BPH (benign prostatic hyperplasia)   . Coronary artery disease    a. s/p MI tx with Cypher DES to pRCA in 4/04;  b. Echocardiogram 7/09: Normal LV function.  c. Nuclear study 3/13 no ischemia;  d. ETT 2/14 neg;  e. admx with CP => LHC (01/30/2013):  pLAD 40-50%, oD1 70-80%, oOM1 40%, pRCA stent patent, mid RCA 30%, EF 60-65%. => med Rx.  . DDD (degenerative disc disease)   . Diabetes mellitus without complication ([place])   . Dyslipidemia   . Hemorrhoids   . Hx  MRSA infection    left buttocks abscess  . Hx of echocardiogram    a. Echocardiogram (01/31/2013): Mild focal basal and mild concentric hypertrophy of the septum, EF 50-55%, normal wall motion, grade 1 diastolic dysfunction, trivial AI, MAC, mild LAE, PASP 35  . Hyperlipidemia   . Hypertension   . Hypothyroidism   . Meniere's disease    Status post shunt  . Myocardial infarction ([place]) 2008  . Occlusion and stenosis of carotid artery without mention of cerebral infarction    40-59% on carotid doppler 2014; [place] (01/2013): R 1-39%, L 60-79%  . Rotator cuff injury    chronic rotator cuff injury status post repair  . Seizure disorder ([place])   . Stroke [facility])    a. 01/2013=> post cardiac cath CVA to L post communicating artery system; R sided weakness  . Syncope     Past Surgical History:  Procedure Laterality Date  . COLONOSCOPY    . CORONARY ANGIOPLASTY WITH STENT PLACEMENT  07/14/2002   Stent to the right coronary artery  . ENDOLYMPHATIC SHUNT DECOMPRESSION  06/11/2009    Right endolymphatic sac decompression and shunt  placement  . HERNIA REPAIR  04/10/2008   scrotal hernia repair  . LEFT HEART CATHETERIZATION WITH CORONARY ANGIOGRAM N/A 01/30/2013   Procedure: LEFT HEART CATHETERIZATION WITH  CORONARY ANGIOGRAM;  Surgeon: Larey Dresser, MD;  Location[facility] CATH LAB;  Service: Cardiovascular;  Laterality: N/A;  . ROTATOR CUFF REPAIR     for chronic rotator cuff injury     Current Outpatient Prescriptions  Medication Sig Dispense Refill  . albuterol (PROVENTIL HFA;VENTOLIN HFA) 108 (90 Base) MCG/ACT inhaler Inhale 2 puffs into the lungs every 4 (four) hours as needed for wheezing or shortness of breath. 1 Inhaler 0  . atorvastatin (LIPITOR) 80 MG tablet TAKE 1 TABLET BY MOUTH EVERY DAY 90 tablet 3  . carbamazepine (CARBATROL) 300 MG 12 hr capsule Take 1 capsule (300 mg total) by mouth 2 (two) times daily. 180 capsule 3  . cloNIDine (CATAPRES) 0.2 MG tablet TAKE 1 TABLET BY MOUTH  TWICE A DAY 60 tablet 11  . diazepam (VALIUM) 2 MG tablet Take 1 tablet (2 mg total) by mouth 2 (two) times daily. Take 2 mg by mouth two times a day 60 tablet 2  . furosemide (LASIX) 20 MG tablet TAKE 1 TABLET BY MOUTH EVERY DAY 30 tablet 11  . guaiFENesin-codeine 100-10 MG/5ML syrup Take 5 mLs by mouth every 6 (six) hours as needed. 120 mL 0  . isosorbide mononitrate (IMDUR) 30 MG 24 hr tablet TAKE 1 TABLET BY MOUTH EVERY DAY 90 tablet 1  . levothyroxine (SYNTHROID, LEVOTHROID) 75 MCG tablet Take 1 tablet (75 mcg total) by mouth daily. 90 tablet 3  . losartan (COZAAR) 100 MG tablet TAKE 1/2 TABLET BY MOUTH 2 TIMES A DAY 30 tablet 11  . metFORMIN (GLUCOPHAGE) 500 MG tablet TAKE 1 TABLET TWICE A DAY WITH A MEAL (Patient taking differently: Take 500 mg by mouth 2 (two) times daily. ) 180 tablet 3  . metoprolol (LOPRESSOR) 100 MG tablet TAKE 1 TABLET BY MOUTH TWICE A DAY 60 tablet 11  . Multiple Vitamins-Minerals (MULTIVITAMINS THER. W/MINERALS) TABS Take 1 tablet by mouth at bedtime.      . pantoprazole (PROTONIX) 40 MG tablet Take 1 tablet (40 mg total) by mouth daily. 30 tablet 0  . pantoprazole (PROTONIX) 40 MG tablet TAKE 1 TABLET BY MOUTH EVERY DAY 90 tablet 3  . warfarin (COUMADIN) 7.5 MG tablet TAKE AS DIRECTED (Patient taking differently: 1.5 tabs M/W/F and 1 tab all other days) 60 tablet 11   No current facility-administered medications for this visit.     Allergies:   Patient has no known allergies.   ROS:  Please see the history of present illness.   Otherwise, review of systems are positive for right rotator cuff pain,  back pain and cough.   All other systems are reviewed and negative.    PHYSICAL EXAM: VS:  BP (!) 160/82   Pulse 87   Ht _0  (1.702 m)   Wt 227 lb 3.2 oz (103.1 kg)   SpO2 97%   BMI 35.58 kg/m  , BMI Body mass index is 35.58 kg/m.  GENERAL:  Well appearing NECK:  No jugular venous distention, waveform within normal limits, carotid upstroke brisk and  symmetric, no bruits, no thyromegaly LYMPHATICS:  No cervical, inguinal adenopathy LUNGS:  Clear to auscultation bilaterally BACK:  No CVA tenderness CHEST:  Unremarkable HEART:  PMI not displaced or sustained,S1 and S2 within normal limits, no S3, no S4, no clicks, no rubs, no murmurs ABD:  Flat, positive bowel sounds normal in frequency in pitch, no bruits, no rebound, no guarding, no midline pulsatile mass, no hepatomegaly, no splenomegaly EXT:  2 plus pulses throughout, mild  edema, no cyanosis no clubbing   EKG:  EKG is not ordered today.   Recent Labs: 10/30/2015: TSH 4.71 12/15/2015: ALT 16 12/23/2015: Hemoglobin 12.2; Magnesium 1.9; Platelets 252 06/10/2016: BUN 22; Creat 1.24; Potassium 4.4; Sodium 140     Wt Readings from Last 3 Encounters:  08/05/16 227 lb 3.2 oz (103.1 kg)  06/10/16 215 lb (97.5 kg)  04/15/16 218 lb (98.9 kg)      Other studies Reviewed: Additional studies/ records that were reviewed today include: None Review of the above records demonstrates:      ASSESSMENT AND PLAN:  Hypertension -   His BP is elevated but this is unusual.  He will keep a BP diary and we will make med changes based on this.   Flutter -  . Mr. Lyndal Reggio Holeman has a CHA2DS2 - VASc score of 7 with a risk of stroke of 9.7%.  He has had no recent symptomatic paroxysms.     Coronary artery disease -  The patient has no new sypmtoms.  No further cardiovascular testing is indicated.  We will continue with aggressive risk reduction and meds as listed.     Dyslipidemia -  He is overdue for lipids.  He is going to see [name], MD today and I will defer arrangements to have a fasting lipid drawn soon.  Lab Results  Component Value Date   CHOL 169 11/15/2014   TRIG 139 11/15/2014   HDL 50 11/15/2014   LDLCALC 91 11/15/2014    Carotid stenosis - He had Doppler in Sept with 1-39% Right ICA stenosis.and 40-59% Left ICA stenosis.  We will follow up this Sept.          Current medicines are reviewed at length with the patient today.  The patient does not have concerns regarding medicines.  The following changes have been made:  None  Labs/ tests ordered today include:  None  No orders of the defined types were placed in this encounter.    Disposition:   FU with me in 6 months.     Signed, Minus Breeding, MD  08/05/2016 12:56 PM    [place]

## 2016-08-05 ENCOUNTER — Encounter: Payer: Self-pay | Admitting: Family Medicine

## 2016-08-05 ENCOUNTER — Encounter: Payer: Self-pay | Admitting: Cardiology

## 2016-08-05 ENCOUNTER — Ambulatory Visit (INDEPENDENT_AMBULATORY_CARE_PROVIDER_SITE_OTHER): Payer: Medicare Other | Admitting: Family Medicine

## 2016-08-05 ENCOUNTER — Ambulatory Visit (INDEPENDENT_AMBULATORY_CARE_PROVIDER_SITE_OTHER): Payer: Medicare Other | Admitting: Cardiology

## 2016-08-05 VITALS — BP 130/72 | HR 80 | Temp 97.9°F | Resp 16 | Ht 65.0 in | Wt 226.0 lb

## 2016-08-05 VITALS — BP 160/82 | HR 87 | Ht 67.0 in | Wt 227.2 lb

## 2016-08-05 DIAGNOSIS — I480 Paroxysmal atrial fibrillation: Secondary | ICD-10-CM | POA: Diagnosis not present

## 2016-08-05 DIAGNOSIS — I2510 Atherosclerotic heart disease of native coronary artery without angina pectoris: Secondary | ICD-10-CM

## 2016-08-05 DIAGNOSIS — I739 Peripheral vascular disease, unspecified: Secondary | ICD-10-CM

## 2016-08-05 DIAGNOSIS — E785 Hyperlipidemia, unspecified: Secondary | ICD-10-CM

## 2016-08-05 DIAGNOSIS — R059 Cough, unspecified: Secondary | ICD-10-CM

## 2016-08-05 DIAGNOSIS — I10 Essential (primary) hypertension: Secondary | ICD-10-CM

## 2016-08-05 DIAGNOSIS — Z8673 Personal history of transient ischemic attack (TIA), and cerebral infarction without residual deficits: Secondary | ICD-10-CM

## 2016-08-05 DIAGNOSIS — I779 Disorder of arteries and arterioles, unspecified: Secondary | ICD-10-CM

## 2016-08-05 DIAGNOSIS — R05 Cough: Secondary | ICD-10-CM

## 2016-08-05 DIAGNOSIS — I4892 Unspecified atrial flutter: Secondary | ICD-10-CM | POA: Diagnosis not present

## 2016-08-05 LAB — PT WITH INR/FINGERSTICK
INR, fingerstick: 2.3 — ABNORMAL HIGH (ref 0.9–1.1)
PT, fingerstick: 27.9 seconds — ABNORMAL HIGH (ref 10.5–13.1)

## 2016-08-05 MED ORDER — CETIRIZINE HCL 10 MG PO TABS: 10.0000 mg | ORAL_TABLET | Freq: Every day | ORAL | 11 refills | Status: DC

## 2016-08-05 NOTE — Patient Instructions (Signed)
Medication Instructions:  Continue current medications  Labwork: None Ordered  Testing/Procedures: None Ordered  Follow-Up: Your physician wants you to follow-up in: 6 Months. You will receive a reminder letter in the mail two months in advance. If you don't receive a letter, please call our office to schedule the follow-up appointment.   Any Other Special Instructions Will Be Listed Below (If Applicable).   Keep a diary of your Blood Pressure.  If you need a refill on your cardiac medications before your next appointment, please call your pharmacy.

## 2016-08-05 NOTE — Progress Notes (Signed)
Subjective:    Patient ID: [name], male    DOB: 03/19/1934, 83 y.o.   MRN: [id number]  HPI Patient presents with a nonproductive dry cough for more than a month.. That time he is also had itchy watery eyes and occasional rhinorrhea. He denies any chest pain. He denies any shortness of breath. He denies any fever. He does report some chest congestion. He denies any sinus pain or sinus pressure. He denies any heartburn. He denies any acid reflux. Exam today is unremarkable. INR is therapeutic Past Medical History:  Diagnosis Date  . Atrial flutter ([place])   . BPH (benign prostatic hyperplasia)   . Coronary artery disease    a. s/p MI tx with Cypher DES to pRCA in 4/04;  b. Echocardiogram 7/09: Normal LV function.  c. Nuclear study 3/13 no ischemia;  d. ETT 2/14 neg;  e. admx with CP => LHC (01/30/2013):  pLAD 40-50%, oD1 70-80%, oOM1 40%, pRCA stent patent, mid RCA 30%, EF 60-65%. => med Rx.  . DDD (degenerative disc disease)   . Diabetes mellitus without complication ([place])   . Dyslipidemia   . Hemorrhoids   . Hx MRSA infection    left buttocks abscess  . Hx of echocardiogram    a. Echocardiogram (01/31/2013): Mild focal basal and mild concentric hypertrophy of the septum, EF 50-55%, normal wall motion, grade 1 diastolic dysfunction, trivial AI, MAC, mild LAE, PASP 35  . Hyperlipidemia   . Hypertension   . Hypothyroidism   . Meniere's disease    Status post shunt  . Myocardial infarction ([place]) 2008  . Occlusion and stenosis of carotid artery without mention of cerebral infarction    40-59% on carotid doppler 2014; [place] (01/2013): R 1-39%, L 60-79%  . Rotator cuff injury    chronic rotator cuff injury status post repair  . Seizure disorder ([place])   . Stroke [facility])    a. 01/2013=> post cardiac cath CVA to L post communicating artery system; R sided weakness  . Syncope    Past Surgical History:  Procedure Laterality Date  . COLONOSCOPY    . CORONARY ANGIOPLASTY WITH STENT  PLACEMENT  07/14/2002   Stent to the right coronary artery  . ENDOLYMPHATIC SHUNT DECOMPRESSION  06/11/2009    Right endolymphatic sac decompression and shunt  placement  . HERNIA REPAIR  04/10/2008   scrotal hernia repair  . LEFT HEART CATHETERIZATION WITH CORONARY ANGIOGRAM N/A 01/30/2013   Procedure: LEFT HEART CATHETERIZATION WITH CORONARY ANGIOGRAM;  Surgeon: Larey Dresser, MD;  Location[facility] C[facility] LAB;  Service: Cardiovascular;  Laterality: N/A;  . ROTATOR CUFF REPAIR     for chronic rotator cuff injury   Current Outpatient Prescriptions on File Prior to Visit  Medication Sig Dispense Refill  . albuterol (PROVENTIL HFA;VENTOLIN HFA) 108 (90 Base) MCG/ACT inhaler Inhale 2 puffs into the lungs every 4 (four) hours as needed for wheezing or shortness of breath. 1 Inhaler 0  . atorvastatin (LIPITOR) 80 MG tablet TAKE 1 TABLET BY MOUTH EVERY DAY 90 tablet 3  . carbamazepine (CARBATROL) 300 MG 12 hr capsule Take 1 capsule (300 mg total) by mouth 2 (two) times daily. 180 capsule 3  . cloNIDine (CATAPRES) 0.2 MG tablet TAKE 1 TABLET BY MOUTH TWICE A DAY 60 tablet 11  . diazepam (VALIUM) 2 MG tablet Take 1 tablet (2 mg total) by mouth 2 (two) times daily. Take 2 mg by mouth two times a day 60 tablet 2  . furosemide (  LASIX) 20 MG tablet TAKE 1 TABLET BY MOUTH EVERY DAY 30 tablet 11  . guaiFENesin-codeine 100-10 MG/5ML syrup Take 5 mLs by mouth every 6 (six) hours as needed. 120 mL 0  . isosorbide mononitrate (IMDUR) 30 MG 24 hr tablet TAKE 1 TABLET BY MOUTH EVERY DAY 90 tablet 1  . levothyroxine (SYNTHROID, LEVOTHROID) 75 MCG tablet Take 1 tablet (75 mcg total) by mouth daily. 90 tablet 3  . losartan (COZAAR) 100 MG tablet TAKE 1/2 TABLET BY MOUTH 2 TIMES A DAY 30 tablet 11  . metFORMIN (GLUCOPHAGE) 500 MG tablet TAKE 1 TABLET TWICE A DAY WITH A MEAL (Patient taking differently: Take 500 mg by mouth 2 (two) times daily. ) 180 tablet 3  . metoprolol (LOPRESSOR) 100 MG tablet TAKE 1 TABLET BY MOUTH  TWICE A DAY 60 tablet 11  . Multiple Vitamins-Minerals (MULTIVITAMINS THER. W/MINERALS) TABS Take 1 tablet by mouth at bedtime.      . pantoprazole (PROTONIX) 40 MG tablet TAKE 1 TABLET BY MOUTH EVERY DAY 90 tablet 3  . warfarin (COUMADIN) 7.5 MG tablet TAKE AS DIRECTED (Patient taking differently: 1.5 tabs M/W/F and 1 tab all other days) 60 tablet 11   No current facility-administered medications on file prior to visit.    No Known Allergies Social History   Social History  . Marital status: Married    Spouse name: N/A  . Number of children: 4  . Years of education: 12th   Occupational History  . Retired    Social History Main Topics  . Smoking status: Former Smoker    Quit date: 08/19/1961  . Smokeless tobacco: Former Systems developer    Types: Chew  . Alcohol use No  . Drug use: No  . Sexual activity: No   Other Topics Concern  . Not on file   Social History Narrative   Lives with wife.      Review of Systems  All other systems reviewed and are negative.      Objective:   Physical Exam  Constitutional: He appears well-developed and well-nourished.  HENT:  Nose: Nose normal.  Mouth/Throat: Oropharynx is clear and moist. No oropharyngeal exudate.  Eyes: Conjunctivae are normal.  Neck: Neck supple.  Cardiovascular: Normal rate and normal heart sounds.  An irregularly irregular rhythm present.  Pulmonary/Chest: Effort normal and breath sounds normal. No respiratory distress. He has no wheezes. He has no rales.  Lymphadenopathy:    He has no cervical adenopathy.  Vitals reviewed.         Assessment & Plan:  Cough  History of CVA (cerebrovascular accident) - Plan: PT with INR/Fingerstick, PT with INR/Fingerstick  Paroxysmal atrial fibrillation ([place]) - Plan: PT with INR/Fingerstick, PT with INR/Fingerstick  INR is therapeutic. Make no changes in his Coumadin dose. I believe the cough is likely due to an irritant such as seasonal allergies. Try Zyrtec 10 mg a day and  recheck in 2 weeks if no better or sooner if worse patient seems to be becoming more frail and unstable on his feet. I recommended that he start doing water aerobics every day and walking in a pool to try to strengthen his legs to prevent falls

## 2016-08-05 NOTE — Addendum Note (Signed)
Addended by: Jeanella Craze E on: 08/05/2016 02:58 PM   Modules accepted: Orders

## 2016-08-25 DIAGNOSIS — H04123 Dry eye syndrome of bilateral lacrimal glands: Secondary | ICD-10-CM | POA: Diagnosis not present

## 2016-08-25 DIAGNOSIS — H524 Presbyopia: Secondary | ICD-10-CM | POA: Diagnosis not present

## 2016-08-25 DIAGNOSIS — H5213 Myopia, bilateral: Secondary | ICD-10-CM | POA: Diagnosis not present

## 2016-08-25 DIAGNOSIS — H52223 Regular astigmatism, bilateral: Secondary | ICD-10-CM | POA: Diagnosis not present

## 2016-08-25 DIAGNOSIS — H26493 Other secondary cataract, bilateral: Secondary | ICD-10-CM | POA: Diagnosis not present

## 2016-08-25 DIAGNOSIS — E119 Type 2 diabetes mellitus without complications: Secondary | ICD-10-CM | POA: Diagnosis not present

## 2016-08-25 DIAGNOSIS — H353132 Nonexudative age-related macular degeneration, bilateral, intermediate dry stage: Secondary | ICD-10-CM | POA: Diagnosis not present

## 2016-08-25 LAB — HM DIABETES EYE EXAM

## 2016-08-26 ENCOUNTER — Encounter: Payer: Self-pay | Admitting: Family Medicine

## 2016-09-03 ENCOUNTER — Encounter: Payer: Self-pay | Admitting: Family Medicine

## 2016-09-03 ENCOUNTER — Ambulatory Visit (INDEPENDENT_AMBULATORY_CARE_PROVIDER_SITE_OTHER): Payer: Medicare Other | Admitting: Family Medicine

## 2016-09-03 VITALS — BP 118/64 | HR 60 | Temp 97.8°F | Resp 16 | Ht 65.0 in | Wt 220.0 lb

## 2016-09-03 DIAGNOSIS — F419 Anxiety disorder, unspecified: Secondary | ICD-10-CM

## 2016-09-03 DIAGNOSIS — I2510 Atherosclerotic heart disease of native coronary artery without angina pectoris: Secondary | ICD-10-CM

## 2016-09-03 DIAGNOSIS — R531 Weakness: Secondary | ICD-10-CM | POA: Diagnosis not present

## 2016-09-03 DIAGNOSIS — R053 Chronic cough: Secondary | ICD-10-CM

## 2016-09-03 DIAGNOSIS — R05 Cough: Secondary | ICD-10-CM

## 2016-09-03 DIAGNOSIS — R5383 Other fatigue: Secondary | ICD-10-CM | POA: Diagnosis not present

## 2016-09-03 MED ORDER — ESCITALOPRAM OXALATE 10 MG PO TABS: 10.0000 mg | ORAL_TABLET | Freq: Every day | ORAL | 3 refills | Status: DC

## 2016-09-03 NOTE — Progress Notes (Signed)
Subjective:    Patient ID: [name], male    DOB: [birth year], 83 y.o.   MRN: [id number]  HPI  08/15/16 Patient presents with a nonproductive dry cough for more than a month.. That time he is also had itchy watery eyes and occasional rhinorrhea. He denies any chest pain. He denies any shortness of breath. He denies any fever. He does report some chest congestion. He denies any sinus pain or sinus pressure. He denies any heartburn. He denies any acid reflux. Exam today is unremarkable. INR is therapeutic.  At that time, my plan was: INR is therapeutic. Make no changes in his Coumadin dose. I believe the cough is likely due to an irritant such as seasonal allergies. Try Zyrtec 10 mg a day and recheck in 2 weeks if no better or sooner if worse patient seems to be becoming more frail and unstable on his feet. I recommended that he start doing water aerobics every day and walking in a pool to try to strengthen his legs to prevent falls  09/03/16 Patient continues to have a cough. Zyrtec did not help. He is taking protonic's every day as directed without benefit. He denies any night sweats or fever shortness of breath chest pain or pleurisy. He denies any purulent sputum or hemoptysis. He also reports fatigue. He has no energy. He has no desire to do anything. He is not exercising. He is quite anxious. He is losing his temper easily. He is having trouble sleeping. He reports feeling depression. He recently retired from [employer] after more than 60 years. I believe this is left him an adjustment phase suffering from depression. He also complains of pain in his right wrist. He has a positive Finkelstein's test. He is tender to palpation over the extensor tendon near the anatomic snuffbox. There is no swelling or erythema. He denies any falls or injuries. Past Medical History:  Diagnosis Date  . Atrial flutter ([place])   . BPH (benign prostatic hyperplasia)   . Coronary artery disease    a. s/p MI tx  with Cypher DES to pRCA in 4/04;  b. Echocardiogram 7/09: Normal LV function.  c. Nuclear study 3/13 no ischemia;  d. ETT 2/14 neg;  e. admx with CP => LHC (01/30/2013):  pLAD 40-50%, oD1 70-80%, oOM1 40%, pRCA stent patent, mid RCA 30%, EF 60-65%. => med Rx.  . DDD (degenerative disc disease)   . Diabetes mellitus without complication ([place])   . Dyslipidemia   . Hemorrhoids   . Hx MRSA infection    left buttocks abscess  . Hx of echocardiogram    a. Echocardiogram (01/31/2013): Mild focal basal and mild concentric hypertrophy of the septum, EF 50-55%, normal wall motion, grade 1 diastolic dysfunction, trivial AI, MAC, mild LAE, PASP 35  . Hyperlipidemia   . Hypertension   . Hypothyroidism   . Meniere's disease    Status post shunt  . Myocardial infarction ([place]) 2008  . Occlusion and stenosis of carotid artery without mention of cerebral infarction    40-59% on carotid doppler 2014; [place] (01/2013): R 1-39%, L 60-79%  . Rotator cuff injury    chronic rotator cuff injury status post repair  . Seizure disorder ([place])   . Stroke [facility])    a. 01/2013=> post cardiac cath CVA to L post communicating artery system; R sided weakness  . Syncope    Past Surgical History:  Procedure Laterality Date  . COLONOSCOPY    . CORONARY ANGIOPLASTY WITH  STENT PLACEMENT  07/14/2002   Stent to the right coronary artery  . ENDOLYMPHATIC SHUNT DECOMPRESSION  06/11/2009    Right endolymphatic sac decompression and shunt  placement  . HERNIA REPAIR  04/10/2008   scrotal hernia repair  . LEFT HEART CATHETERIZATION WITH CORONARY ANGIOGRAM N/A 01/30/2013   Procedure: LEFT HEART CATHETERIZATION WITH CORONARY ANGIOGRAM;  Surgeon: Larey Dresser, MD;  Location: [facility] CATH LAB;  Service: Cardiovascular;  Laterality: N/A;  . ROTATOR CUFF REPAIR     for chronic rotator cuff injury   Current Outpatient Prescriptions on File Prior to Visit  Medication Sig Dispense Refill  . albuterol (PROVENTIL HFA;VENTOLIN HFA) 108 (90  Base) MCG/ACT inhaler Inhale 2 puffs into the lungs every 4 (four) hours as needed for wheezing or shortness of breath. 1 Inhaler 0  . atorvastatin (LIPITOR) 80 MG tablet TAKE 1 TABLET BY MOUTH EVERY DAY 90 tablet 3  . carbamazepine (CARBATROL) 300 MG 12 hr capsule Take 1 capsule (300 mg total) by mouth 2 (two) times daily. 180 capsule 3  . cetirizine (ZYRTEC) 10 MG tablet Take 1 tablet (10 mg total) by mouth daily. 30 tablet 11  . cloNIDine (CATAPRES) 0.2 MG tablet TAKE 1 TABLET BY MOUTH TWICE A DAY 60 tablet 11  . diazepam (VALIUM) 2 MG tablet Take 1 tablet (2 mg total) by mouth 2 (two) times daily. Take 2 mg by mouth two times a day 60 tablet 2  . furosemide (LASIX) 20 MG tablet TAKE 1 TABLET BY MOUTH EVERY DAY 30 tablet 11  . guaiFENesin-codeine 100-10 MG/5ML syrup Take 5 mLs by mouth every 6 (six) hours as needed. 120 mL 0  . isosorbide mononitrate (IMDUR) 30 MG 24 hr tablet TAKE 1 TABLET BY MOUTH EVERY DAY 90 tablet 1  . levothyroxine (SYNTHROID, LEVOTHROID) 75 MCG tablet Take 1 tablet (75 mcg total) by mouth daily. 90 tablet 3  . losartan (COZAAR) 100 MG tablet TAKE 1/2 TABLET BY MOUTH 2 TIMES A DAY 30 tablet 11  . metFORMIN (GLUCOPHAGE) 500 MG tablet TAKE 1 TABLET TWICE A DAY WITH A MEAL (Patient taking differently: Take 500 mg by mouth 2 (two) times daily. ) 180 tablet 3  . metoprolol (LOPRESSOR) 100 MG tablet TAKE 1 TABLET BY MOUTH TWICE A DAY 60 tablet 11  . Multiple Vitamins-Minerals (MULTIVITAMINS THER. W/MINERALS) TABS Take 1 tablet by mouth at bedtime.      . pantoprazole (PROTONIX) 40 MG tablet TAKE 1 TABLET BY MOUTH EVERY DAY 90 tablet 3  . warfarin (COUMADIN) 7.5 MG tablet TAKE AS DIRECTED (Patient taking differently: 1.5 tabs M/W/F and 1 tab all other days) 60 tablet 11   No current facility-administered medications on file prior to visit.    No Known Allergies Social History   Social History  . Marital status: Married    Spouse name: N/A  . Number of children: 4  . Years  of education: 12th   Occupational History  . Retired    Social History Main Topics  . Smoking status: Former Smoker    Quit date: 08/19/1961  . Smokeless tobacco: Former Systems developer    Types: Chew  . Alcohol use No  . Drug use: No  . Sexual activity: No   Other Topics Concern  . Not on file   Social History Narrative   Lives with wife.      Review of Systems  All other systems reviewed and are negative.      Objective:   Physical Exam  Constitutional: He appears well-developed and well-nourished.  HENT:  Nose: Nose normal.  Mouth/Throat: Oropharynx is clear and moist. No oropharyngeal exudate.  Eyes: Conjunctivae are normal.  Neck: Neck supple.  Cardiovascular: Normal rate and normal heart sounds.  An irregularly irregular rhythm present.  Pulmonary/Chest: Effort normal and breath sounds normal. No respiratory distress. He has no wheezes. He has no rales.  Lymphadenopathy:    He has no cervical adenopathy.  Vitals reviewed.         Assessment & Plan:  Chronic cough - Plan: DG Chest 2 View  Anxiety  Weakness  Fatigue, unspecified type  I believe the patient is likely dealing from upper airway cough syndrome. However I'll begin the workup by obtaining a chest x-ray. Consider maximizing PPI therapy in addition to allergy medication with a cough suppressant such as Tessalon Perles if chest x-ray is normal. Recommended Aspercreme be applied to the wrist for "veins tenosynovitis. If no better return for cortisone injection. He can also wear some spica wrist splint to allow the area to rest. Treat his depression with Lexapro 10 mg by mouth daily and reassess in one month. Recommending increasing physical activity gradually to increase his stamina

## 2016-09-06 ENCOUNTER — Ambulatory Visit
Admission: RE | Admit: 2016-09-06 | Discharge: 2016-09-06 | Disposition: A | Discharge status: Home / Self Care | Payer: Medicare Other | Source: Ambulatory Visit | Attending: Family Medicine | Admitting: Family Medicine

## 2016-09-06 DIAGNOSIS — R05 Cough: Secondary | ICD-10-CM

## 2016-09-06 DIAGNOSIS — R053 Chronic cough: Secondary | ICD-10-CM

## 2016-09-08 ENCOUNTER — Other Ambulatory Visit: Payer: Self-pay | Admitting: Family Medicine

## 2016-09-08 DIAGNOSIS — R05 Cough: Secondary | ICD-10-CM

## 2016-09-08 DIAGNOSIS — R053 Chronic cough: Secondary | ICD-10-CM

## 2016-09-08 MED ORDER — BENZONATATE 200 MG PO CAPS: 200.0000 mg | ORAL_CAPSULE | Freq: Every day | ORAL | 2 refills | Status: DC

## 2016-09-08 MED ORDER — FLUTICASONE PROPIONATE 50 MCG/ACT NA SUSP: 2.0000 | Freq: Every day | NASAL | 6 refills | Status: DC

## 2016-09-08 MED ORDER — PANTOPRAZOLE SODIUM 40 MG PO TBEC: 40.0000 mg | DELAYED_RELEASE_TABLET | Freq: Two times a day (BID) | ORAL | 3 refills | Status: DC

## 2016-09-20 DIAGNOSIS — M654 Radial styloid tenosynovitis [de Quervain]: Secondary | ICD-10-CM | POA: Diagnosis not present

## 2016-09-23 ENCOUNTER — Other Ambulatory Visit: Payer: Self-pay | Admitting: Family Medicine

## 2016-09-23 MED ORDER — CLONIDINE HCL 0.2 MG PO TABS
0.2000 mg | ORAL_TABLET | Freq: Two times a day (BID) | ORAL | 3 refills | Status: DC
Start: 2016-09-23 — End: 2017-11-10

## 2016-09-23 MED ORDER — FUROSEMIDE 20 MG PO TABS: 20.0000 mg | ORAL_TABLET | Freq: Every day | ORAL | 3 refills | Status: DC

## 2016-09-23 MED ORDER — LOSARTAN POTASSIUM 100 MG PO TABS: ORAL_TABLET | ORAL | 3 refills | Status: DC

## 2016-09-23 MED ORDER — METOPROLOL TARTRATE 100 MG PO TABS: 100.0000 mg | ORAL_TABLET | Freq: Two times a day (BID) | ORAL | 3 refills | Status: DC

## 2016-09-30 ENCOUNTER — Ambulatory Visit: Payer: Medicare Other | Admitting: *Deleted

## 2016-09-30 ENCOUNTER — Encounter: Payer: Self-pay | Admitting: *Deleted

## 2016-09-30 VITALS — BP 124/62 | HR 62 | Temp 97.9°F | Resp 16 | Ht 65.0 in | Wt 222.0 lb

## 2016-09-30 NOTE — Progress Notes (Signed)
Patient seen in office for wound care.   Reports that on 09/29/2016, patient fell in bathroom , hitting L arm on shower and causing 5x4cm ST to L mid-arm, 3x1cm St to L upper wrist, and 2x0.5cm ST to L wrist.   Skin to mid arm ST noted to have rolled under. Cleansed area with NS and applied Steri-Strips x5 to mid arm ST, x1 to L upper wrist.  Advised that Steri-Strips will fall off when ready.   Applied ABTx ointment and nonadherent guaze. Covered with 4x4 and Kerlex.     Tdap UTD- 07/31/2014. Advised to monitor for S/Sx of infection.

## 2016-10-07 ENCOUNTER — Other Ambulatory Visit: Payer: Self-pay | Admitting: Family Medicine

## 2016-10-12 ENCOUNTER — Other Ambulatory Visit: Payer: Self-pay | Admitting: Family Medicine

## 2016-10-18 ENCOUNTER — Other Ambulatory Visit: Payer: Self-pay

## 2016-10-25 ENCOUNTER — Other Ambulatory Visit: Payer: Self-pay | Admitting: Family Medicine

## 2016-10-25 MED ORDER — WARFARIN SODIUM 7.5 MG PO TABS: ORAL_TABLET | ORAL | 3 refills | Status: DC

## 2016-11-01 ENCOUNTER — Ambulatory Visit (INDEPENDENT_AMBULATORY_CARE_PROVIDER_SITE_OTHER): Payer: Medicare Other | Admitting: Internal Medicine

## 2016-11-01 ENCOUNTER — Encounter: Payer: Self-pay | Admitting: Internal Medicine

## 2016-11-01 VITALS — BP 140/82 | HR 65 | Ht 65.0 in | Wt 218.6 lb

## 2016-11-01 DIAGNOSIS — R05 Cough: Secondary | ICD-10-CM

## 2016-11-01 DIAGNOSIS — I2510 Atherosclerotic heart disease of native coronary artery without angina pectoris: Secondary | ICD-10-CM

## 2016-11-01 DIAGNOSIS — R058 Other specified cough: Secondary | ICD-10-CM

## 2016-11-01 LAB — NITRIC OXIDE: Nitric Oxide: 33

## 2016-11-01 MED ORDER — FAMOTIDINE 20 MG PO TABS: ORAL_TABLET | ORAL | 2 refills | Status: DC

## 2016-11-01 MED ORDER — RABEPRAZOLE SODIUM 20 MG PO TBEC: 20.0000 mg | DELAYED_RELEASE_TABLET | Freq: Every day | ORAL | 2 refills | Status: DC

## 2016-11-01 MED ORDER — PREDNISONE 10 MG PO TABS: ORAL_TABLET | ORAL | 0 refills | Status: DC

## 2016-11-01 MED ORDER — PANTOPRAZOLE SODIUM 40 MG PO TBEC: 40.0000 mg | DELAYED_RELEASE_TABLET | Freq: Every day | ORAL | 2 refills | Status: DC

## 2016-11-01 NOTE — Progress Notes (Signed)
Subjective:    Patient ID: [name], male    DOB: 02/11/1934,    MRN: [id number]  HPI  68 yowm never regular smoker with  onset seasonal rhinitis around 2008 rx just otcs then insidious onset persistent daily  cough x early 2018 waxes and wanes since then/   referred to pulmonary clinic 11/01/2016 by Dr   Dennard Schaumann   11/01/2016 1st [facility] office visit/ [facility]   Chief Complaint  Patient presents with  . Advice Only    referred by [name] for chronic cough.     indolent onset waxes and wanes but most nights at hs coughs but doesn't wake him once he's asleep or early inam   very minimal slt greenish mucus < 1 tsp assoc with nasal congestion since onset of cough that is different from his seasonal rhinitis pattern / no better on flonase  Not limited by breathing from desired activities  But very sedendary / only sob when coughing   No obvious other patterns in day to day or daytime variabilty or assoc   cp or chest tightness, subjective wheeze overt    hb symptoms. No unusual exp hx or h/o childhood pna/ asthma or knowledge of premature birth.  Sleeping ok without nocturnal  or early am exacerbation  of respiratory  c/o's or need for noct saba. Also denies any obvious fluctuation of symptoms with weather or environmental changes or other aggravating or alleviating factors except as outlined above   Current Medications, Allergies, Complete Past Medical History, Past Surgical History, Family History, and Social History were reviewed in [employer] record.  Does have chronic diarrhea on protonix bid                Review of Systems  Constitutional: Negative for fever and unexpected weight change.  HENT: Positive for congestion, postnasal drip and rhinorrhea. Negative for dental problem, ear pain, nosebleeds, sinus pressure, sneezing, sore throat and trouble swallowing.   Eyes: Negative for redness and itching.  Respiratory: Positive for cough  and shortness of breath. Negative for chest tightness and wheezing.   Cardiovascular: Negative for palpitations and leg swelling.  Gastrointestinal: Negative for nausea and vomiting.  Genitourinary: Negative for dysuria.  Musculoskeletal: Negative for joint swelling.  Skin: Negative for rash.  Neurological: Negative for headaches.  Hematological: Does not bruise/bleed easily.  Psychiatric/Behavioral: Negative for dysphoric mood. The patient is not nervous/anxious.        Objective:   Physical Exam  Hoarse amb wm nad  Wt Readings from Last 3 Encounters:  11/01/16 218 lb 9.6 oz (99.2 kg)  09/30/16 222 lb (100.7 kg)  09/03/16 220 lb (99.8 kg)    Vital signs reviewed - Note on arrival 02 sats  97% on RA      HEENT: nl dentition,   and oropharynx. Nl external ear canals without cough reflex - moderate bilateral non-specific turbinate edema     NECK :  without JVD/Nodes/TM/ nl carotid upstrokes bilaterally   LUNGS: no acc muscle use,  Nl contour chest which is clear to A and P bilaterally without cough on insp or exp maneuvers   CV:  RRR  no s3 or murmur or increase in P2, and no edema   ABD:  soft and nontender with nl inspiratory excursion in the supine position. No bruits or organomegaly appreciated, bowel sounds nl  MS:  Nl gait/ ext warm without deformities, calf tenderness, cyanosis or clubbing No obvious joint restrictions  SKIN: warm and dry without lesions    NEURO:  alert, approp, nl sensorium with  no motor or cerebellar deficits apparent.     I personally reviewed images and agree with radiology impression as follows:  CXR:   09/06/16 There is no pneumonia, CHF, nor other acute cardiopulmonary Abnormality.   Labs ordered 11/01/2016  Allergy profile : did not go to lab     Assessment & Plan:

## 2016-11-01 NOTE — Patient Instructions (Addendum)
Stop pantoprazole  Prednisone 10 mg take  4 each am x 2 days,   2 each am x 2 days,  1 each am x 2 days and stop   Aciphex 20  mg   Take  30-60 min before first meal of the day and Pepcid (famotidine)  20 mg one @  bedtime until return to office - this is the best way to tell whether stomach acid is contributing to your problem.    GERD (REFLUX)  is an extremely common cause of respiratory symptoms just like yours , many times with no obvious heartburn at all.    It can be treated with medication, but also with lifestyle changes including elevation of the head of your bed (ideally with 6 inch  bed blocks),  Smoking cessation, avoidance of late meals, excessive alcohol, and avoid fatty foods, chocolate, peppermint, colas, red wine, and acidic juices such as orange juice.  NO MINT OR MENTHOL PRODUCTS SO NO COUGH DROPS   USE SUGARLESS CANDY INSTEAD (Jolley ranchers or Stover's or Life Savers) or even ice chips will also do - the key is to swallow to prevent all throat clearing. NO OIL BASED VITAMINS - use powdered substitutes.    Please see patient coordinator before you leave today  to schedule sinus CT and I will call with results  Please remember to go to the lab department downstairs in the basement  for your tests - we will call you with the results when they are available.      Please schedule a follow up office visit in 4 weeks, sooner if needed  with all medications /inhalers/ solutions in hand so we can verify exactly what you are taking. This includes all medications from all doctors and over the counters

## 2016-11-02 ENCOUNTER — Ambulatory Visit (INDEPENDENT_AMBULATORY_CARE_PROVIDER_SITE_OTHER)
Admission: RE | Admit: 2016-11-02 | Discharge: 2016-11-02 | Disposition: A | Discharge status: Home / Self Care | Payer: Medicare Other | Source: Ambulatory Visit | Attending: Internal Medicine | Admitting: Internal Medicine

## 2016-11-02 DIAGNOSIS — R058 Other specified cough: Secondary | ICD-10-CM

## 2016-11-02 DIAGNOSIS — R05 Cough: Secondary | ICD-10-CM | POA: Diagnosis not present

## 2016-11-02 NOTE — Assessment & Plan Note (Addendum)
Allergy profile 11/01/2016 >  Eos 0. /  IgE  Did not go to lab - Sinus CT 11/01/2016  - FENO 11/01/2016  =    33 off all rx   The most common causes of chronic cough in immunocompetent adults include the following: upper airway cough syndrome (UACS), previously referred to as postnasal drip syndrome (PNDS), which is caused by variety of rhinosinus conditions; (2) asthma; (3) GERD; (4) chronic bronchitis from cigarette smoking or other inhaled environmental irritants; (5) nonasthmatic eosinophilic bronchitis; and (6) bronchiectasis.   These conditions, singly or in combination, have accounted for up to 94% of the causes of chronic cough in prospective studies.   Other conditions have constituted no >6% of the causes in prospective studies These have included bronchogenic carcinoma, chronic interstitial pneumonia, sarcoidosis, left ventricular failure, ACEI-induced cough, and aspiration from a condition associated with pharyngeal dysfunction.    Chronic cough is often simultaneously caused by more than one condition. A single cause has been found from 38 to 82% of the time, multiple causes from 18 to 62%. Multiply caused cough has been the result of three diseases up to 42% of the time.       Most likely in this pt with new pattern of seasonal rhinitis x last 10 years (which by itself is unusual since never had allergies prior) this is Upper airway cough syndrome (previously labeled PNDS) , is  so named because it's frequently impossible to sort out how much is  CR/sinusitis with freq throat clearing (which can be related to primary GERD)   vs  causing  secondary (" extra esophageal")  GERD from wide swings in gastric pressure that occur with throat clearing, often  promoting self use of mint and menthol lozenges that reduce the lower esophageal sphincter tone and exacerbate the problem further in a cyclical fashion.   These are the same pts (now being labeled as having "irritable larynx syndrome" by some  cough centers) who not infrequently have a history of having failed to tolerate ace inhibitors,  dry powder inhalers or biphosphonates or report having atypical/extraesophageal reflux symptoms that don't respond to standard doses of PPI  and are easily confused as having aecopd or asthma flares by even experienced allergists/ pulmonologists (myself included).   Will start with max rx directed at both acid and non acid reflux with aciphex instead of protonix as he has poor gi tolerance apparently to the latter) and h2 hs and check sinus ct/ allergy profile on return and in meantime give short course of prednisone since he does have intermediate FENO suggesting eos inflammation and may get relief, albeit temporary    Total time devoted to counseling  > 50 % of initial 60 min office visit:  review case with pt/ discussion of options/alternatives/ personally creating written customized instructions  in presence of pt  then going over those specific  Instructions directly with the pt including how to use all of the meds but in particular covering each new medication in detail and the difference between the maintenance= "automatic" meds and the prns using an action plan format for the latter (If this problem/symptom => do that organization reading Left to right).  Please see AVS from this visit for a full list of these instructions which I personally wrote for this pt and  are unique to this visit.

## 2016-11-02 NOTE — Assessment & Plan Note (Signed)
Complicated by hbp/ hyperlipidemia   Body mass index is 36.38 kg/m.  -  trending down slightly/ encouraged  Lab Results  Component Value Date   TSH 4.71 (H) 10/30/2015     Contributing to gerd risk/ doe/reviewed the need and the process to achieve and maintain neg calorie balance > defer f/u primary care including intermittently monitoring thyroid status

## 2016-11-10 ENCOUNTER — Other Ambulatory Visit: Payer: Self-pay | Admitting: Family Medicine

## 2016-11-10 MED ORDER — ESCITALOPRAM OXALATE 10 MG PO TABS: 10.0000 mg | ORAL_TABLET | Freq: Every day | ORAL | 3 refills | Status: DC

## 2016-12-07 ENCOUNTER — Ambulatory Visit: Payer: Medicare Other | Admitting: Internal Medicine

## 2016-12-09 ENCOUNTER — Ambulatory Visit (INDEPENDENT_AMBULATORY_CARE_PROVIDER_SITE_OTHER): Payer: Medicare Other | Admitting: *Deleted

## 2016-12-09 DIAGNOSIS — Z23 Encounter for immunization: Secondary | ICD-10-CM | POA: Diagnosis not present

## 2016-12-09 NOTE — Progress Notes (Signed)
Patient seen in office for Influenza Vaccination.   Tolerated IM administration well.   Immunization history updated.  

## 2016-12-14 ENCOUNTER — Ambulatory Visit (HOSPITAL_COMMUNITY)
Admission: RE | Admit: 2016-12-14 | Discharge: 2016-12-14 | Disposition: A | Discharge status: Home / Self Care | Payer: Medicare Other | Source: Ambulatory Visit | Attending: Cardiovascular Disease | Admitting: Cardiovascular Disease

## 2016-12-14 DIAGNOSIS — I6523 Occlusion and stenosis of bilateral carotid arteries: Secondary | ICD-10-CM | POA: Insufficient documentation

## 2016-12-14 DIAGNOSIS — I2510 Atherosclerotic heart disease of native coronary artery without angina pectoris: Secondary | ICD-10-CM | POA: Insufficient documentation

## 2016-12-14 DIAGNOSIS — E119 Type 2 diabetes mellitus without complications: Secondary | ICD-10-CM | POA: Insufficient documentation

## 2016-12-14 DIAGNOSIS — Z8673 Personal history of transient ischemic attack (TIA), and cerebral infarction without residual deficits: Secondary | ICD-10-CM | POA: Insufficient documentation

## 2016-12-14 DIAGNOSIS — I10 Essential (primary) hypertension: Secondary | ICD-10-CM | POA: Diagnosis not present

## 2016-12-14 DIAGNOSIS — Z87891 Personal history of nicotine dependence: Secondary | ICD-10-CM | POA: Insufficient documentation

## 2016-12-14 DIAGNOSIS — E785 Hyperlipidemia, unspecified: Secondary | ICD-10-CM | POA: Insufficient documentation

## 2016-12-14 DIAGNOSIS — I739 Peripheral vascular disease, unspecified: Secondary | ICD-10-CM

## 2016-12-14 DIAGNOSIS — I779 Disorder of arteries and arterioles, unspecified: Secondary | ICD-10-CM | POA: Diagnosis present

## 2016-12-20 ENCOUNTER — Other Ambulatory Visit: Payer: Self-pay | Admitting: Internal Medicine

## 2016-12-20 DIAGNOSIS — R058 Other specified cough: Secondary | ICD-10-CM

## 2016-12-20 DIAGNOSIS — R05 Cough: Secondary | ICD-10-CM

## 2016-12-20 MED ORDER — FAMOTIDINE 20 MG PO TABS: ORAL_TABLET | ORAL | 0 refills | Status: DC

## 2016-12-30 ENCOUNTER — Other Ambulatory Visit: Payer: Self-pay | Admitting: Internal Medicine

## 2016-12-30 MED ORDER — RABEPRAZOLE SODIUM 20 MG PO TBEC: 20.0000 mg | DELAYED_RELEASE_TABLET | Freq: Every day | ORAL | 0 refills | Status: DC

## 2017-01-11 DIAGNOSIS — H6123 Impacted cerumen, bilateral: Secondary | ICD-10-CM | POA: Diagnosis not present

## 2017-01-27 ENCOUNTER — Other Ambulatory Visit: Payer: Self-pay | Admitting: Family Medicine

## 2017-01-27 MED ORDER — ISOSORBIDE MONONITRATE ER 30 MG PO TB24: 30.0000 mg | ORAL_TABLET | Freq: Every day | ORAL | 1 refills | Status: DC

## 2017-03-03 ENCOUNTER — Other Ambulatory Visit: Payer: Self-pay | Admitting: Dermatology

## 2017-03-03 DIAGNOSIS — L570 Actinic keratosis: Secondary | ICD-10-CM | POA: Diagnosis not present

## 2017-03-03 DIAGNOSIS — C44319 Basal cell carcinoma of skin of other parts of face: Secondary | ICD-10-CM | POA: Diagnosis not present

## 2017-03-10 ENCOUNTER — Encounter: Payer: Self-pay | Admitting: Family Medicine

## 2017-03-10 ENCOUNTER — Ambulatory Visit (INDEPENDENT_AMBULATORY_CARE_PROVIDER_SITE_OTHER): Payer: Medicare Other | Admitting: Family Medicine

## 2017-03-10 VITALS — BP 100/56 | HR 62 | Temp 98.3°F | Resp 18 | Ht 65.0 in | Wt 218.0 lb

## 2017-03-10 DIAGNOSIS — I480 Paroxysmal atrial fibrillation: Secondary | ICD-10-CM

## 2017-03-10 DIAGNOSIS — R42 Dizziness and giddiness: Secondary | ICD-10-CM | POA: Diagnosis not present

## 2017-03-10 DIAGNOSIS — Z8673 Personal history of transient ischemic attack (TIA), and cerebral infarction without residual deficits: Secondary | ICD-10-CM

## 2017-03-10 DIAGNOSIS — I2510 Atherosclerotic heart disease of native coronary artery without angina pectoris: Secondary | ICD-10-CM | POA: Diagnosis not present

## 2017-03-10 LAB — PT WITH INR/FINGERSTICK
INR, fingerstick: 2.7 ratio — ABNORMAL HIGH
PT, fingerstick: 32.4 s — ABNORMAL HIGH (ref 10.5–13.1)

## 2017-03-10 MED ORDER — MECLIZINE HCL 25 MG PO TABS: 25.0000 mg | ORAL_TABLET | Freq: Three times a day (TID) | ORAL | 0 refills | Status: DC | PRN

## 2017-03-10 NOTE — Progress Notes (Signed)
Subjective:    Patient ID: [name], male    DOB: Feb 21, 1934, 83 y.o.   MRN: [id number]  HPI Patient has a long history of vertigo. More than 4 years ago, patient had a "shunt" place by your nose and throat physician that improve his vertigo. Apparently he was treated for Mnire's disease. Over the last 3 weeks, the vertigo has returned. He reports the room spinning whenever he looks up. The room will spin sometimes when he sits up and better when he stands up quickly. He denies any syncope. He denies any hearing loss. He denies any blurry vision. He denies any ear pain or sinus pain. Exam today is completely normal. There is no neurologic deficit. Cranial nerves II through XII are grossly intact with muscle strength 5 over 5 equal and symmetric in the upper and lower extremities. He is on Coumadin for paroxysmal atrial fibrillation with a history of CVA. His INR today is therapeutic at 2.7.  Past Medical History:  Diagnosis Date  . Atrial flutter ([place])   . BPH (benign prostatic hyperplasia)   . Coronary artery disease    a. s/p MI tx with Cypher DES to pRCA in 4/04;  b. Echocardiogram 7/09: Normal LV function.  c. Nuclear study 3/13 no ischemia;  d. ETT 2/14 neg;  e. admx with CP => LHC (01/30/2013):  pLAD 40-50%, oD1 70-80%, oOM1 40%, pRCA stent patent, mid RCA 30%, EF 60-65%. => med Rx.  . DDD (degenerative disc disease)   . Diabetes mellitus without complication ([place])   . Dyslipidemia   . Hemorrhoids   . Hx MRSA infection    left buttocks abscess  . Hx of echocardiogram    a. Echocardiogram (01/31/2013): Mild focal basal and mild concentric hypertrophy of the septum, EF 50-55%, normal wall motion, grade 1 diastolic dysfunction, trivial AI, MAC, mild LAE, PASP 35  . Hyperlipidemia   . Hypertension   . Hypothyroidism   . Meniere's disease    Status post shunt  . Myocardial infarction ([place]) 2008  . Occlusion and stenosis of carotid artery without mention of cerebral infarction      40-59% on carotid doppler 2014; [place] (01/2013): R 1-39%, L 60-79%  . Rotator cuff injury    chronic rotator cuff injury status post repair  . Seizure disorder ([place])   . Stroke [facility])    a. 01/2013=> post cardiac cath CVA to L post communicating artery system; R sided weakness  . Syncope    Past Surgical History:  Procedure Laterality Date  . COLONOSCOPY    . CORONARY ANGIOPLASTY WITH STENT PLACEMENT  07/14/2002   Stent to the right coronary artery  . ENDOLYMPHATIC SHUNT DECOMPRESSION  06/11/2009    Right endolymphatic sac decompression and shunt  placement  . HERNIA REPAIR  04/10/2008   scrotal hernia repair  . LEFT HEART CATHETERIZATION WITH CORONARY ANGIOGRAM N/A 01/30/2013   Procedure: LEFT HEART CATHETERIZATION WITH CORONARY ANGIOGRAM;  Surgeon: Larey Dresser, MD;  Location: [facility] CATH LAB;  Service: Cardiovascular;  Laterality: N/A;  . ROTATOR CUFF REPAIR     for chronic rotator cuff injury   Current Outpatient Medications on File Prior to Visit  Medication Sig Dispense Refill  . albuterol (PROVENTIL HFA;VENTOLIN HFA) 108 (90 Base) MCG/ACT inhaler Inhale 2 puffs into the lungs every 4 (four) hours as needed for wheezing or shortness of breath. 1 Inhaler 0  . atorvastatin (LIPITOR) 80 MG tablet TAKE 1 TABLET BY MOUTH EVERY DAY 90 tablet 3  .  carbamazepine (CARBATROL) 300 MG 12 hr capsule TAKE ONE CAPSULE BY MOUTH TWICE A DAY 180 capsule 3  . cetirizine (ZYRTEC) 10 MG tablet Take 1 tablet (10 mg total) by mouth daily. 30 tablet 11  . cloNIDine (CATAPRES) 0.2 MG tablet Take 1 tablet (0.2 mg total) by mouth 2 (two) times daily. 180 tablet 3  . diazepam (VALIUM) 2 MG tablet Take 1 tablet (2 mg total) by mouth 2 (two) times daily. Take 2 mg by mouth two times a day 60 tablet 2  . escitalopram (LEXAPRO) 10 MG tablet Take 1 tablet (10 mg total) by mouth daily. 90 tablet 3  . famotidine (PEPCID) 20 MG tablet One at bedtime 90 tablet 0  . fluticasone (FLONASE) 50 MCG/ACT nasal spray Place 2  sprays into both nostrils daily. 16 g 6  . furosemide (LASIX) 20 MG tablet Take 1 tablet (20 mg total) by mouth daily. 90 tablet 3  . isosorbide mononitrate (IMDUR) 30 MG 24 hr tablet Take 1 tablet (30 mg total) daily by mouth. 90 tablet 1  . levothyroxine (SYNTHROID, LEVOTHROID) 75 MCG tablet Take 1 tablet (75 mcg total) by mouth daily. 90 tablet 3  . losartan (COZAAR) 100 MG tablet TAKE 1/2 TABLET BY MOUTH 2 TIMES A DAY 90 tablet 3  . metFORMIN (GLUCOPHAGE) 500 MG tablet TAKE 1 TABLET BY MOUTH TWICE A DAY WITH A MEAL 180 tablet 2  . metoprolol tartrate (LOPRESSOR) 100 MG tablet Take 1 tablet (100 mg total) by mouth 2 (two) times daily. 180 tablet 3  . Multiple Vitamins-Minerals (MULTIVITAMINS THER. W/MINERALS) TABS Take 1 tablet by mouth at bedtime.      . RABEprazole (ACIPHEX) 20 MG tablet Take 1 tablet (20 mg total) by mouth daily. 90 tablet 0  . warfarin (COUMADIN) 7.5 MG tablet 1.5 tabs M/W/F and 1 tab all other days 180 tablet 3   No current facility-administered medications on file prior to visit.    No Known Allergies Social History   Socioeconomic History  . Marital status: Married    Spouse name: Not on file  . Number of children: 4  . Years of education: 12th  . Highest education level: Not on file  Social Needs  . Financial resource strain: Not on file  . Food insecurity - worry: Not on file  . Food insecurity - inability: Not on file  . Transportation needs - medical: Not on file  . Transportation needs - non-medical: Not on file  Occupational History  . Occupation: Retired  Tobacco Use  . Smoking status: Former Smoker    Years: 5.00    Types: Cigarettes    Last attempt to quit: 08/19/1956    Years since quitting: 60.5  . Smokeless tobacco: Former Systems developer    Types: Chew  Substance and Sexual Activity  . Alcohol use: No  . Drug use: No  . Sexual activity: No  Other Topics Concern  . Not on file  Social History Narrative   Lives with wife.      Review of  Systems  All other systems reviewed and are negative.      Objective:   Physical Exam  Constitutional: He is oriented to person, place, and time. He appears well-developed and well-nourished.  HENT:  Right Ear: External ear normal.  Left Ear: External ear normal.  Nose: Nose normal.  Mouth/Throat: Oropharynx is clear and moist. No oropharyngeal exudate.  Eyes: Conjunctivae are normal. Pupils are equal, round, and reactive to light.  Cardiovascular:  Normal rate and normal heart sounds.  No murmur heard. Pulmonary/Chest: Effort normal and breath sounds normal. No respiratory distress. He has no wheezes. He has no rales.  Neurological: He is alert and oriented to person, place, and time. He has normal reflexes. He displays normal reflexes. No cranial nerve deficit. He exhibits normal muscle tone. Coordination normal.  Vitals reviewed.         Assessment & Plan:  Vertigo  History of CVA (cerebrovascular accident) - Plan: PT with INR/Fingerstick  Paroxysmal atrial fibrillation ([place]) - Plan: PT with INR/Fingerstick  Patient's dizziness sounds like vertigo. It sounds more like benign paroxysmal positional vertigo. Therefore I will start the patient on meclizine 25 mg every 8 hours as needed. I recommended that he follow-up with his urine is throat physician for reevaluation of the shunt to determine if there could be a component of Mnire's disease at play. His blood pressure is relatively low today. I've asked him to check his blood pressure 3 times a day over the weekend and, with the values next week. If his blood pressure remains this low, there could be an element of hypotension at play as well and we may need to decrease some of his medication. His INR today is therapeutic. I will make no changes in his Coumadin and recommended a repeat INR in 4 weeks

## 2017-03-22 ENCOUNTER — Other Ambulatory Visit: Payer: Self-pay | Admitting: Family Medicine

## 2017-03-25 ENCOUNTER — Other Ambulatory Visit: Payer: Self-pay | Admitting: Internal Medicine

## 2017-03-25 DIAGNOSIS — R05 Cough: Secondary | ICD-10-CM

## 2017-03-25 DIAGNOSIS — R058 Other specified cough: Secondary | ICD-10-CM

## 2017-03-28 ENCOUNTER — Other Ambulatory Visit: Payer: Self-pay | Admitting: *Deleted

## 2017-03-28 DIAGNOSIS — R05 Cough: Secondary | ICD-10-CM

## 2017-03-28 DIAGNOSIS — R058 Other specified cough: Secondary | ICD-10-CM

## 2017-03-28 MED ORDER — FAMOTIDINE 20 MG PO TABS: ORAL_TABLET | ORAL | 0 refills | Status: DC

## 2017-04-05 ENCOUNTER — Other Ambulatory Visit: Payer: Self-pay | Admitting: Internal Medicine

## 2017-04-05 DIAGNOSIS — R05 Cough: Secondary | ICD-10-CM

## 2017-04-05 DIAGNOSIS — R058 Other specified cough: Secondary | ICD-10-CM

## 2017-04-06 ENCOUNTER — Other Ambulatory Visit: Payer: Self-pay | Admitting: Internal Medicine

## 2017-04-14 ENCOUNTER — Ambulatory Visit (INDEPENDENT_AMBULATORY_CARE_PROVIDER_SITE_OTHER): Payer: Medicare Other | Admitting: Family Medicine

## 2017-04-14 ENCOUNTER — Encounter: Payer: Self-pay | Admitting: Family Medicine

## 2017-04-14 VITALS — BP 150/70 | HR 64 | Temp 98.3°F | Resp 16 | Ht 65.0 in | Wt 222.0 lb

## 2017-04-14 DIAGNOSIS — Z8673 Personal history of transient ischemic attack (TIA), and cerebral infarction without residual deficits: Secondary | ICD-10-CM

## 2017-04-14 DIAGNOSIS — J069 Acute upper respiratory infection, unspecified: Secondary | ICD-10-CM | POA: Diagnosis not present

## 2017-04-14 DIAGNOSIS — I480 Paroxysmal atrial fibrillation: Secondary | ICD-10-CM | POA: Diagnosis not present

## 2017-04-14 LAB — PT WITH INR/FINGERSTICK
INR, fingerstick: 2.5 ratio — ABNORMAL HIGH
PT, fingerstick: 30.6 s — ABNORMAL HIGH (ref 10.5–13.1)

## 2017-04-14 NOTE — Progress Notes (Signed)
Subjective:    Patient ID: [name], male    DOB: 04-Feb-1934, 84 y.o.   MRN: [id number]  HPI Patient is here today to check his INR. He is on Coumadin due to his history of atrial fibrillation as well as a remote history of stroke. His INR today is therapeutic at 2.5. He denies any bleeding or bruising. He denies any recent medication changes. He also reports a one-week history of rhinorrhea, head congestion, and nonproductive cough. He denies any fevers or chills. He denies any otalgia. He denies any sinus pain. He denies any shortness of breath or chest pain. Exam today is completely normal Past Medical History:  Diagnosis Date  . Atrial flutter ([place])   . BPH (benign prostatic hyperplasia)   . Coronary artery disease    a. s/p MI tx with Cypher DES to pRCA in 4/04;  b. Echocardiogram 7/09: Normal LV function.  c. Nuclear study 3/13 no ischemia;  d. ETT 2/14 neg;  e. admx with CP => LHC (01/30/2013):  pLAD 40-50%, oD1 70-80%, oOM1 40%, pRCA stent patent, mid RCA 30%, EF 60-65%. => med Rx.  . DDD (degenerative disc disease)   . Diabetes mellitus without complication ([place])   . Dyslipidemia   . Hemorrhoids   . Hx MRSA infection    left buttocks abscess  . Hx of echocardiogram    a. Echocardiogram (01/31/2013): Mild focal basal and mild concentric hypertrophy of the septum, EF 50-55%, normal wall motion, grade 1 diastolic dysfunction, trivial AI, MAC, mild LAE, PASP 35  . Hyperlipidemia   . Hypertension   . Hypothyroidism   . Meniere's disease    Status post shunt  . Myocardial infarction ([place]) 2008  . Occlusion and stenosis of carotid artery without mention of cerebral infarction    40-59% on carotid doppler 2014; [place] (01/2013): R 1-39%, L 60-79%  . Rotator cuff injury    chronic rotator cuff injury status post repair  . Seizure disorder ([place])   . Stroke [facility])    a. 01/2013=> post cardiac cath CVA to L post communicating artery system; R sided weakness  . Syncope    Past  Surgical History:  Procedure Laterality Date  . COLONOSCOPY    . CORONARY ANGIOPLASTY WITH STENT PLACEMENT  07/14/2002   Stent to the right coronary artery  . ENDOLYMPHATIC SHUNT DECOMPRESSION  06/11/2009    Right endolymphatic sac decompression and shunt  placement  . HERNIA REPAIR  04/10/2008   scrotal hernia repair  . LEFT HEART CATHETERIZATION WITH CORONARY ANGIOGRAM N/A 01/30/2013   Procedure: LEFT HEART CATHETERIZATION WITH CORONARY ANGIOGRAM;  Surgeon: Larey Dresser, MD;  Location: [facility] CATH LAB;  Service: Cardiovascular;  Laterality: N/A;  . ROTATOR CUFF REPAIR     for chronic rotator cuff injury   Current Outpatient Medications on File Prior to Visit  Medication Sig Dispense Refill  . albuterol (PROVENTIL HFA;VENTOLIN HFA) 108 (90 Base) MCG/ACT inhaler Inhale 2 puffs into the lungs every 4 (four) hours as needed for wheezing or shortness of breath. 1 Inhaler 0  . atorvastatin (LIPITOR) 80 MG tablet TAKE 1 TABLET BY MOUTH EVERY DAY 90 tablet 3  . carbamazepine (CARBATROL) 300 MG 12 hr capsule TAKE ONE CAPSULE BY MOUTH TWICE A DAY 180 capsule 3  . cetirizine (ZYRTEC) 10 MG tablet Take 1 tablet (10 mg total) by mouth daily. 30 tablet 11  . cloNIDine (CATAPRES) 0.2 MG tablet Take 1 tablet (0.2 mg total) by mouth 2 (two) times  daily. 180 tablet 3  . diazepam (VALIUM) 2 MG tablet Take 1 tablet (2 mg total) by mouth 2 (two) times daily. Take 2 mg by mouth two times a day 60 tablet 2  . escitalopram (LEXAPRO) 10 MG tablet Take 1 tablet (10 mg total) by mouth daily. 90 tablet 3  . famotidine (PEPCID) 20 MG tablet One at bedtime 90 tablet 0  . fluticasone (FLONASE) 50 MCG/ACT nasal spray Place 2 sprays into both nostrils daily. 16 g 6  . furosemide (LASIX) 20 MG tablet Take 1 tablet (20 mg total) by mouth daily. 90 tablet 3  . isosorbide mononitrate (IMDUR) 30 MG 24 hr tablet Take 1 tablet (30 mg total) daily by mouth. 90 tablet 1  . levothyroxine (SYNTHROID, LEVOTHROID) 75 MCG tablet TAKE  1 TABLET BY MOUTH EVERY DAY 90 tablet 3  . losartan (COZAAR) 100 MG tablet TAKE 1/2 TABLET BY MOUTH 2 TIMES A DAY 90 tablet 3  . meclizine (ANTIVERT) 25 MG tablet Take 1 tablet (25 mg total) by mouth 3 (three) times daily as needed for dizziness. 30 tablet 0  . metFORMIN (GLUCOPHAGE) 500 MG tablet TAKE 1 TABLET BY MOUTH TWICE A DAY WITH A MEAL 180 tablet 2  . metoprolol tartrate (LOPRESSOR) 100 MG tablet Take 1 tablet (100 mg total) by mouth 2 (two) times daily. 180 tablet 3  . Multiple Vitamins-Minerals (MULTIVITAMINS THER. W/MINERALS) TABS Take 1 tablet by mouth at bedtime.      . RABEprazole (ACIPHEX) 20 MG tablet Take 1 tablet (20 mg total) by mouth daily. 90 tablet 0  . warfarin (COUMADIN) 7.5 MG tablet 1.5 tabs M/W/F and 1 tab all other days 180 tablet 3   No current facility-administered medications on file prior to visit.    No Known Allergies Social History   Socioeconomic History  . Marital status: Married    Spouse name: Not on file  . Number of children: 4  . Years of education: 12th  . Highest education level: Not on file  Social Needs  . Financial resource strain: Not on file  . Food insecurity - worry: Not on file  . Food insecurity - inability: Not on file  . Transportation needs - medical: Not on file  . Transportation needs - non-medical: Not on file  Occupational History  . Occupation: Retired  Tobacco Use  . Smoking status: Former Smoker    Years: 5.00    Types: Cigarettes    Last attempt to quit: 08/19/1956    Years since quitting: 60.6  . Smokeless tobacco: Former Systems developer    Types: Chew  Substance and Sexual Activity  . Alcohol use: No  . Drug use: No  . Sexual activity: No  Other Topics Concern  . Not on file  Social History Narrative   Lives with wife.      Review of Systems  All other systems reviewed and are negative.      Objective:   Physical Exam  Constitutional: He appears well-developed and well-nourished.  HENT:  Right Ear: External  ear normal.  Left Ear: External ear normal.  Nose: Nose normal.  Mouth/Throat: Oropharynx is clear and moist. No oropharyngeal exudate.  Eyes: Conjunctivae are normal.  Neck: Neck supple.  Cardiovascular: Normal rate and normal heart sounds.  Pulmonary/Chest: Effort normal and breath sounds normal. No respiratory distress. He has no wheezes. He has no rales.  Lymphadenopathy:    He has no cervical adenopathy.  Vitals reviewed.  Assessment & Plan:  URI, acute  History of CVA (cerebrovascular accident) - Plan: PT with INR/Fingerstick  Paroxysmal atrial fibrillation ([place]) - Plan: PT with INR/Fingerstick, PT with INR/Fingerstick  The patient's INR is therapeutic. I will make no changes in his Coumadin dose and recheck INR in 6 weeks. His history and exam is consistent with a mild viral upper respiratory infection. I recommended Mucinex for cough and Coricidin for head congestion. Patient is taking 2 medications for acid reflux but is asymptomatic. I recommended that he try to temporarily discontinue famotidine as well as aciphex and see if symptoms return

## 2017-04-14 NOTE — Addendum Note (Signed)
Addended by: Shary Decamp B on: 04/14/2017 04:06 PM   Modules accepted: Orders

## 2017-04-25 ENCOUNTER — Other Ambulatory Visit: Payer: Self-pay | Admitting: Internal Medicine

## 2017-05-06 ENCOUNTER — Encounter: Payer: Self-pay | Admitting: Family Medicine

## 2017-05-06 ENCOUNTER — Ambulatory Visit (INDEPENDENT_AMBULATORY_CARE_PROVIDER_SITE_OTHER): Payer: Medicare Other | Admitting: Family Medicine

## 2017-05-06 VITALS — BP 120/70 | HR 62 | Temp 98.6°F | Resp 18 | Ht 65.0 in | Wt 217.0 lb

## 2017-05-06 DIAGNOSIS — J069 Acute upper respiratory infection, unspecified: Secondary | ICD-10-CM

## 2017-05-06 NOTE — Progress Notes (Signed)
Subjective:    Patient ID: [name], male    DOB: [birth year], 84 y.o.   MRN: [id number]  HPI Patient presents with a 2-day history of runny nose.  He denies any fever.  He denies any chills.  He denies any myalgias.  He does report a cough productive of clear and trace yellow sputum.  He denies any chest pain or shortness of breath.  He denies any ear pain.  He denies any sore throat.  He denies any pleurisy.  He denies any diffuse myalgias Past Medical History:  Diagnosis Date  . Atrial flutter ([place])   . BPH (benign prostatic hyperplasia)   . Coronary artery disease    a. s/p MI tx with Cypher DES to pRCA in 4/04;  b. Echocardiogram 7/09: Normal LV function.  c. Nuclear study 3/13 no ischemia;  d. ETT 2/14 neg;  e. admx with CP => LHC (01/30/2013):  pLAD 40-50%, oD1 70-80%, oOM1 40%, pRCA stent patent, mid RCA 30%, EF 60-65%. => med Rx.  . DDD (degenerative disc disease)   . Diabetes mellitus without complication ([place])   . Dyslipidemia   . Hemorrhoids   . Hx MRSA infection    left buttocks abscess  . Hx of echocardiogram    a. Echocardiogram (01/31/2013): Mild focal basal and mild concentric hypertrophy of the septum, EF 50-55%, normal wall motion, grade 1 diastolic dysfunction, trivial AI, MAC, mild LAE, PASP 35  . Hyperlipidemia   . Hypertension   . Hypothyroidism   . Meniere's disease    Status post shunt  . Myocardial infarction ([place]) 2008  . Occlusion and stenosis of carotid artery without mention of cerebral infarction    40-59% on carotid doppler 2014; [place] (01/2013): R 1-39%, L 60-79%  . Rotator cuff injury    chronic rotator cuff injury status post repair  . Seizure disorder ([place])   . Stroke [facility])    a. 01/2013=> post cardiac cath CVA to L post communicating artery system; R sided weakness  . Syncope    Past Surgical History:  Procedure Laterality Date  . COLONOSCOPY    . CORONARY ANGIOPLASTY WITH STENT PLACEMENT  07/14/2002   Stent to the right coronary artery   . ENDOLYMPHATIC SHUNT DECOMPRESSION  06/11/2009    Right endolymphatic sac decompression and shunt  placement  . HERNIA REPAIR  04/10/2008   scrotal hernia repair  . LEFT HEART CATHETERIZATION WITH CORONARY ANGIOGRAM N/A 01/30/2013   Procedure: LEFT HEART CATHETERIZATION WITH CORONARY ANGIOGRAM;  Surgeon: Larey Dresser, MD;  Location[facility] CATH LAB;  Service: Cardiovascular;  Laterality: N/A;  . ROTATOR CUFF REPAIR     for chronic rotator cuff injury   Current Outpatient Medications on File Prior to Visit  Medication Sig Dispense Refill  . albuterol (PROVENTIL HFA;VENTOLIN HFA) 108 (90 Base) MCG/ACT inhaler Inhale 2 puffs into the lungs every 4 (four) hours as needed for wheezing or shortness of breath. 1 Inhaler 0  . atorvastatin (LIPITOR) 80 MG tablet TAKE 1 TABLET BY MOUTH EVERY DAY 90 tablet 3  . carbamazepine (CARBATROL) 300 MG 12 hr capsule TAKE ONE CAPSULE BY MOUTH TWICE A DAY 180 capsule 3  . cloNIDine (CATAPRES) 0.2 MG tablet Take 1 tablet (0.2 mg total) by mouth 2 (two) times daily. 180 tablet 3  . diazepam (VALIUM) 2 MG tablet Take 1 tablet (2 mg total) by mouth 2 (two) times daily. Take 2 mg by mouth two times a day 60 tablet 2  .  escitalopram (LEXAPRO) 10 MG tablet Take 1 tablet (10 mg total) by mouth daily. 90 tablet 3  . fluticasone (FLONASE) 50 MCG/ACT nasal spray Place 2 sprays into both nostrils daily. 16 g 6  . furosemide (LASIX) 20 MG tablet Take 1 tablet (20 mg total) by mouth daily. 90 tablet 3  . isosorbide mononitrate (IMDUR) 30 MG 24 hr tablet Take 1 tablet (30 mg total) daily by mouth. 90 tablet 1  . levothyroxine (SYNTHROID, LEVOTHROID) 75 MCG tablet TAKE 1 TABLET BY MOUTH EVERY DAY 90 tablet 3  . losartan (COZAAR) 100 MG tablet TAKE 1/2 TABLET BY MOUTH 2 TIMES A DAY 90 tablet 3  . metFORMIN (GLUCOPHAGE) 500 MG tablet TAKE 1 TABLET BY MOUTH TWICE A DAY WITH A MEAL 180 tablet 2  . metoprolol tartrate (LOPRESSOR) 100 MG tablet Take 1 tablet (100 mg total) by mouth 2  (two) times daily. 180 tablet 3  . Multiple Vitamins-Minerals (MULTIVITAMINS THER. W/MINERALS) TABS Take 1 tablet by mouth at bedtime.      Marland Kitchen warfarin (COUMADIN) 7.5 MG tablet 1.5 tabs M/W/F and 1 tab all other days 180 tablet 3   No current facility-administered medications on file prior to visit.    No Known Allergies Social History   Socioeconomic History  . Marital status: Married    Spouse name: Not on file  . Number of children: 4  . Years of education: 12th  . Highest education level: Not on file  Social Needs  . Financial resource strain: Not on file  . Food insecurity - worry: Not on file  . Food insecurity - inability: Not on file  . Transportation needs - medical: Not on file  . Transportation needs - non-medical: Not on file  Occupational History  . Occupation: Retired  Tobacco Use  . Smoking status: Former Smoker    Years: 5.00    Types: Cigarettes    Last attempt to quit: 08/19/1956    Years since quitting: 60.7  . Smokeless tobacco: Former Systems developer    Types: Chew  Substance and Sexual Activity  . Alcohol use: No  . Drug use: No  . Sexual activity: No  Other Topics Concern  . Not on file  Social History Narrative   Lives with wife.      Review of Systems  All other systems reviewed and are negative.      Objective:   Physical Exam  Constitutional: He appears well-developed and well-nourished.  HENT:  Right Ear: External ear normal.  Left Ear: External ear normal.  Nose: Nose normal.  Mouth/Throat: Oropharynx is clear and moist. No oropharyngeal exudate.  Eyes: Conjunctivae are normal.  Neck: Neck supple.  Cardiovascular: Normal rate and normal heart sounds.  Pulmonary/Chest: Effort normal and breath sounds normal. No respiratory distress. He has no wheezes. He has no rales.  Lymphadenopathy:    He has no cervical adenopathy.  Vitals reviewed.         Assessment & Plan:  URI, acute  Patient symptoms are consistent with a viral  respiratory infection/cold.  I recommended a combination of Coricidin and/or Afrin for nasal congestion.  He can use Mucinex and/or Delsym for cough.  He can use Tylenol for body aches and fever.  Recommended tincture of time for for 5 days and symptoms should resolve spontaneously on their own.  Given his age, recheck immediately if getting worse.

## 2017-05-23 ENCOUNTER — Ambulatory Visit: Payer: Medicare Other | Admitting: Physician Assistant

## 2017-06-09 ENCOUNTER — Other Ambulatory Visit: Payer: Self-pay | Admitting: Family Medicine

## 2017-06-15 ENCOUNTER — Emergency Department (HOSPITAL_COMMUNITY)
Admission: EM | Admit: 2017-06-15 | Discharge: 2017-06-15 | Disposition: A | Discharge status: Home / Self Care | Payer: Medicare Other | Attending: Emergency Medicine | Admitting: Emergency Medicine

## 2017-06-15 ENCOUNTER — Other Ambulatory Visit: Payer: Self-pay

## 2017-06-15 ENCOUNTER — Emergency Department (HOSPITAL_COMMUNITY): Payer: Medicare Other

## 2017-06-15 ENCOUNTER — Encounter (HOSPITAL_COMMUNITY): Payer: Self-pay | Admitting: Emergency Medicine

## 2017-06-15 DIAGNOSIS — Z79899 Other long term (current) drug therapy: Secondary | ICD-10-CM | POA: Insufficient documentation

## 2017-06-15 DIAGNOSIS — Z955 Presence of coronary angioplasty implant and graft: Secondary | ICD-10-CM | POA: Diagnosis not present

## 2017-06-15 DIAGNOSIS — I2510 Atherosclerotic heart disease of native coronary artery without angina pectoris: Secondary | ICD-10-CM | POA: Diagnosis not present

## 2017-06-15 DIAGNOSIS — W010XXA Fall on same level from slipping, tripping and stumbling without subsequent striking against object, initial encounter: Secondary | ICD-10-CM | POA: Insufficient documentation

## 2017-06-15 DIAGNOSIS — S6991XA Unspecified injury of right wrist, hand and finger(s), initial encounter: Secondary | ICD-10-CM | POA: Diagnosis present

## 2017-06-15 DIAGNOSIS — I110 Hypertensive heart disease with heart failure: Secondary | ICD-10-CM | POA: Insufficient documentation

## 2017-06-15 DIAGNOSIS — Z7901 Long term (current) use of anticoagulants: Secondary | ICD-10-CM | POA: Diagnosis not present

## 2017-06-15 DIAGNOSIS — S62616A Displaced fracture of proximal phalanx of right little finger, initial encounter for closed fracture: Secondary | ICD-10-CM | POA: Insufficient documentation

## 2017-06-15 DIAGNOSIS — Y9301 Activity, walking, marching and hiking: Secondary | ICD-10-CM | POA: Diagnosis not present

## 2017-06-15 DIAGNOSIS — I5032 Chronic diastolic (congestive) heart failure: Secondary | ICD-10-CM | POA: Insufficient documentation

## 2017-06-15 DIAGNOSIS — Z7984 Long term (current) use of oral hypoglycemic drugs: Secondary | ICD-10-CM | POA: Insufficient documentation

## 2017-06-15 DIAGNOSIS — Y999 Unspecified external cause status: Secondary | ICD-10-CM | POA: Diagnosis not present

## 2017-06-15 DIAGNOSIS — Y929 Unspecified place or not applicable: Secondary | ICD-10-CM | POA: Diagnosis not present

## 2017-06-15 DIAGNOSIS — E119 Type 2 diabetes mellitus without complications: Secondary | ICD-10-CM | POA: Diagnosis not present

## 2017-06-15 DIAGNOSIS — Z87891 Personal history of nicotine dependence: Secondary | ICD-10-CM | POA: Diagnosis not present

## 2017-06-15 DIAGNOSIS — E039 Hypothyroidism, unspecified: Secondary | ICD-10-CM | POA: Insufficient documentation

## 2017-06-15 NOTE — Discharge Instructions (Signed)
Return if any problems.

## 2017-06-15 NOTE — ED Notes (Signed)
Pt returned from X Ray.

## 2017-06-15 NOTE — ED Triage Notes (Signed)
Pt tripped in yard hurting right pinky finger.

## 2017-06-15 NOTE — ED Notes (Signed)
Patient transported to X-ray 

## 2017-06-16 NOTE — ED Provider Notes (Signed)
[facility] EMERGENCY DEPARTMENT Provider Note   CSN: [id number] Arrival date & time: 06/15/17  1851     History   Chief Complaint Chief Complaint  Patient presents with  . Fall    HPI Lenvil Swaim Ringgenberg is a 84 y.o. male.  Pt fell and injured right ring finger.  Pt complains of swelling and pain    The history is provided by the patient. No language interpreter was used.  Hand Pain  This is a new problem. The current episode started 1 to 2 hours ago. The problem occurs constantly. The problem has not changed since onset.Nothing aggravates the symptoms. Nothing relieves the symptoms.    Past Medical History:  Diagnosis Date  . Atrial flutter ([place])   . BPH (benign prostatic hyperplasia)   . Coronary artery disease    a. s/p MI tx with Cypher DES to pRCA in 4/04;  b. Echocardiogram 7/09: Normal LV function.  c. Nuclear study 3/13 no ischemia;  d. ETT 2/14 neg;  e. admx with CP => LHC (01/30/2013):  pLAD 40-50%, oD1 70-80%, oOM1 40%, pRCA stent patent, mid RCA 30%, EF 60-65%. => med Rx.  . DDD (degenerative disc disease)   . Diabetes mellitus without complication ([place])   . Dyslipidemia   . Hemorrhoids   . Hx MRSA infection    left buttocks abscess  . Hx of echocardiogram    a. Echocardiogram (01/31/2013): Mild focal basal and mild concentric hypertrophy of the septum, EF 50-55%, normal wall motion, grade 1 diastolic dysfunction, trivial AI, MAC, mild LAE, PASP 35  . Hyperlipidemia   . Hypertension   . Hypothyroidism   . Meniere's disease    Status post shunt  . Myocardial infarction ([place]) 2008  . Occlusion and stenosis of carotid artery without mention of cerebral infarction    40-59% on carotid doppler 2014; [place] (01/2013): R 1-39%, L 60-79%  . Rotator cuff injury    chronic rotator cuff injury status post repair  . Seizure disorder ([place])   . Stroke [facility])    a. 01/2013=> post cardiac cath CVA to L post communicating artery system; R sided weakness  . Syncope      Patient Active Problem List   Diagnosis Date Noted  . Upper airway cough syndrome 11/01/2016  . Atrial flutter ([place]) 08/05/2016  . Coronary artery disease involving native coronary artery of native heart without angina pectoris 08/05/2016  . Chronic anticoagulation 12/31/2015  . History of seizures 12/31/2015  . Acute diastolic CHF (congestive heart failure) ([place]) 12/24/2015  . Pulmonary edema   . Paroxysmal atrial fibrillation (HCC)   . Hypothyroidism   . Controlled type 2 diabetes mellitus with complication, without long-term current use of insulin ([place])   . Hypertensive emergency 12/20/2015  . Special screening for malignant neoplasms, colon 12/07/2013  . Bowel habit changes 12/07/2013  . Diarrhea 03/09/2013  . History of cardioembolic cerebrovascular accident (CVA) 01/31/2013  . Chest pain 01/29/2013  . Bilateral carotid artery disease ([place])   . Loss of coordination 12/03/2012  . Morbid obesity due to excess calories ([place]) 08/20/2011  . CAD S/P percutaneous coronary angioplasty   . Dyslipidemia   . Hypertension     Past Surgical History:  Procedure Laterality Date  . COLONOSCOPY    . CORONARY ANGIOPLASTY WITH STENT PLACEMENT  07/14/2002   Stent to the right coronary artery  . ENDOLYMPHATIC SHUNT DECOMPRESSION  06/11/2009    Right endolymphatic sac decompression and shunt  placement  . HERNIA REPAIR  04/10/2008   scrotal hernia repair  . LEFT HEART CATHETERIZATION WITH CORONARY ANGIOGRAM N/A 01/30/2013   Procedure: LEFT HEART CATHETERIZATION WITH CORONARY ANGIOGRAM;  Surgeon: Larey Dresser, MD;  Location: [facility] C[facility] LAB;  Service: Cardiovascular;  Laterality: N/A;  . ROTATOR CUFF REPAIR     for chronic rotator cuff injury        Home Medications    Prior to Admission medications   Medication Sig Start Date End Date Taking? Authorizing Provider  albuterol (PROVENTIL HFA;VENTOLIN HFA) 108 (90 Base) MCG/ACT inhaler Inhale 2 puffs into the lungs every 4 (four)  hours as needed for wheezing or shortness of breath. 03/08/16   Alycia Rossetti, MD  atorvastatin (LIPITOR) 80 MG tablet TAKE 1 TABLET BY MOUTH EVERY DAY 07/13/16   [name], MD  carbamazepine (CARBATROL) 300 MG 12 hr capsule TAKE ONE CAPSULE BY MOUTH TWICE A DAY 10/13/16   [name], MD  cloNIDine (CATAPRES) 0.2 MG tablet Take 1 tablet (0.2 mg total) by mouth 2 (two) times daily. 09/23/16   [name], MD  diazepam (VALIUM) 2 MG tablet Take 1 tablet (2 mg total) by mouth 2 (two) times daily. Take 2 mg by mouth two times a day 03/23/16   [name], MD  escitalopram (LEXAPRO) 10 MG tablet Take 1 tablet (10 mg total) by mouth daily. 11/10/16   [name], MD  fluticasone (FLONASE) 50 MCG/ACT nasal spray Place 2 sprays into both nostrils daily. 09/08/16   [name], MD  furosemide (LASIX) 20 MG tablet Take 1 tablet (20 mg total) by mouth daily. 09/23/16   [name], MD  isosorbide mononitrate (IMDUR) 30 MG 24 hr tablet Take 1 tablet (30 mg total) daily by mouth. 01/27/17   [name], MD  levothyroxine (SYNTHROID, LEVOTHROID) 75 MCG tablet TAKE 1 TABLET BY MOUTH EVERY DAY 03/23/17   [name], MD  losartan (COZAAR) 100 MG tablet TAKE 1/2 TABLET BY MOUTH 2 TIMES A DAY 09/23/16   [name], MD  metFORMIN (GLUCOPHAGE) 500 MG tablet TAKE 1 TABLET BY MOUTH TWICE A DAY WITH A MEAL 06/10/17   [name], MD  metoprolol tartrate (LOPRESSOR) 100 MG tablet Take 1 tablet (100 mg total) by mouth 2 (two) times daily. 09/23/16   [name], MD  Multiple Vitamins-Minerals (MULTIVITAMINS THER. W/MINERALS) TABS Take 1 tablet by mouth at bedtime.      [provider]  warfarin (COUMADIN) 7.5 MG tablet 1.5 tabs M/W/F and 1 tab all other days 10/25/16   [name], MD    Family History Family History  Problem Relation Age of Onset  . Heart attack Father 31  . Aneurysm Mother        brain aneurysm  . Coronary artery disease  Brother        with CABG  . Diabetes Brother   . Colon cancer Neg Hx   . Colon polyps Neg Hx     Social History Social History   Tobacco Use  . Smoking status: Former Smoker    Years: 5.00    Types: Cigarettes    Last attempt to quit: 08/19/1956    Years since quitting: 60.8  . Smokeless tobacco: Former Systems developer    Types: Chew  Substance Use Topics  . Alcohol use: No  . Drug use: No     Allergies   Patient has no known allergies.   Review of Systems Review  of Systems  Musculoskeletal: Positive for arthralgias and joint swelling.  All other systems reviewed and are negative.    Physical Exam Updated Vital Signs BP (!) 185/84 (BP Location: Left Arm)   Pulse (!) 57   Temp (!) 97.4 F (36.3 C) (Oral)   Resp 18   Ht _0  (1.727 m)   Wt 99.8 kg (220 lb)   SpO2 98%   BMI 33.45 kg/m   Physical Exam  Constitutional: He appears well-developed and well-nourished.  Musculoskeletal: He exhibits tenderness and deformity.  Swollen tender right fifth finger,  nv and ns intact   Neurological: He is alert.  Skin: Skin is warm.  Psychiatric: He has a normal mood and affect.  Nursing note and vitals reviewed.    ED Treatments / Results  Labs (all labs ordered are listed, but only abnormal results are displayed) Labs Reviewed - No data to display  EKG None  Radiology Dg Finger Little Right  Result Date: 06/15/2017 CLINICAL DATA:  Fall with little finger pain EXAM: RIGHT LITTLE FINGER 2+V COMPARISON:  None. FINDINGS: Acute, mildly impacted fracture involving the proximal metaphysis of the fifth proximal phalanx with mild dorsal angulation of distal fracture fragment. No subluxation. IMPRESSION: Acute, impacted and mildly angulated fracture involving the proximal shaft of the fifth proximal phalanx. Electronically Signed   By: Donavan Foil M.D.   On: 06/15/2017 19:35    Procedures Procedures (including critical care time)  Medications Ordered in ED Medications - No  data to display   Initial Impression / Assessment and Plan / ED Course  I have reviewed the triage vital signs and the nursing notes.  Pertinent labs & imaging results that were available during my care of the patient were reviewed by me and considered in my medical decision making (see chart for details).     MDM  Xray reviewed and discussed with pt.   Pt placed in a splint Pt advised to follow up with Dr. Aline Brochure for recheck.  Pt advised to call for appointment.   Final Clinical Impressions(s) / ED Diagnoses   Final diagnoses:  Closed displaced fracture of proximal phalanx of right little finger, initial encounter    ED Discharge Orders    None    An After Visit Summary was printed and given to the patient.    Sidney Ace 06/16/17 Ninetta Lights    Davonna Belling, MD 06/16/17 [phone]

## 2017-06-20 ENCOUNTER — Ambulatory Visit (INDEPENDENT_AMBULATORY_CARE_PROVIDER_SITE_OTHER): Payer: Medicare Other | Admitting: Orthopedic Surgery

## 2017-06-20 VITALS — BP 150/92 | HR 63 | Ht 65.0 in | Wt 217.0 lb

## 2017-06-20 DIAGNOSIS — S62647A Nondisplaced fracture of proximal phalanx of left little finger, initial encounter for closed fracture: Secondary | ICD-10-CM

## 2017-06-20 NOTE — Progress Notes (Signed)
NEW PATIENT OFFICE VISIT   Chief Complaint  Patient presents with  . Hand Pain    ER follow up on right little finger, DOI 06-17-17.    12 male fell and injured left small finger on March 29  He comes in for evaluation for acute fracture left small finger proximal phalanx  He complains of minimal discomfort over the proximal aspect of the left small finger times 3 days associated swelling and stiffness are expected and noted   Review of Systems  Skin: Negative.   Neurological: Negative for tingling and sensory change.     Past Medical History:  Diagnosis Date  . Atrial flutter ([place])   . BPH (benign prostatic hyperplasia)   . Coronary artery disease    a. s/p MI tx with Cypher DES to pRCA in 4/04;  b. Echocardiogram 7/09: Normal LV function.  c. Nuclear study 3/13 no ischemia;  d. ETT 2/14 neg;  e. admx with CP => LHC (01/30/2013):  pLAD 40-50%, oD1 70-80%, oOM1 40%, pRCA stent patent, mid RCA 30%, EF 60-65%. => med Rx.  . DDD (degenerative disc disease)   . Diabetes mellitus without complication ([place])   . Dyslipidemia   . Hemorrhoids   . Hx MRSA infection    left buttocks abscess  . Hx of echocardiogram    a. Echocardiogram (01/31/2013): Mild focal basal and mild concentric hypertrophy of the septum, EF 50-55%, normal wall motion, grade 1 diastolic dysfunction, trivial AI, MAC, mild LAE, PASP 35  . Hyperlipidemia   . Hypertension   . Hypothyroidism   . Meniere's disease    Status post shunt  . Myocardial infarction ([place]) 2008  . Occlusion and stenosis of carotid artery without mention of cerebral infarction    40-59% on carotid doppler 2014; [place] (01/2013): R 1-39%, L 60-79%  . Rotator cuff injury    chronic rotator cuff injury status post repair  . Seizure disorder ([place])   . Stroke [facility])    a. 01/2013=> post cardiac cath CVA to L post communicating artery system; R sided weakness  . Syncope     Past Surgical History:  Procedure Laterality Date  . COLONOSCOPY    .  CORONARY ANGIOPLASTY WITH STENT PLACEMENT  07/14/2002   Stent to the right coronary artery  . ENDOLYMPHATIC SHUNT DECOMPRESSION  06/11/2009    Right endolymphatic sac decompression and shunt  placement  . HERNIA REPAIR  04/10/2008   scrotal hernia repair  . LEFT HEART CATHETERIZATION WITH CORONARY ANGIOGRAM N/A 01/30/2013   Procedure: LEFT HEART CATHETERIZATION WITH CORONARY ANGIOGRAM;  Surgeon: Larey Dresser, MD;  Location[facility] C[facility] LAB;  Service: Cardiovascular;  Laterality: N/A;  . ROTATOR CUFF REPAIR     for chronic rotator cuff injury    Family History  Problem Relation Age of Onset  . Heart attack Father 82  . Aneurysm Mother        brain aneurysm  . Coronary artery disease Brother        with CABG  . Diabetes Brother   . Colon cancer Neg Hx   . Colon polyps Neg Hx    Social History   Tobacco Use  . Smoking status: Former Smoker    Years: 5.00    Types: Cigarettes    Last attempt to quit: 08/19/1956    Years since quitting: 60.8  . Smokeless tobacco: Former Systems developer    Types: Chew  Substance Use Topics  . Alcohol use: No  . Drug use: No    @  ALL@  No outpatient medications have been marked as taking for the 06/20/17 encounter (Office Visit) with Carole Civil, MD.    BP (!) 150/92   Pulse 63   Ht _0  (1.651 m)   Wt 217 lb (98.4 kg)   BMI 36.11 kg/m   Physical Exam  Constitutional: He is oriented to person, place, and time. He appears well-developed and well-nourished.  Vital signs have been reviewed and are stable. Gen. appearance the patient is well-developed and well-nourished with normal grooming and hygiene.   Neurological: He is alert and oriented to person, place, and time.  Skin: Skin is warm and dry. No erythema.  Psychiatric: He has a normal mood and affect.  Vitals reviewed.   Ortho Exam Left small finger shows no significant malalignment versus the right.  There is tenderness at the proximal phalanx of the left small finger he has  decreased range of motion the fracture itself is stable the muscle tone is normal without atrophy the skin shows some ecchymosis without laceration capillary refill normal radial pulse normal epitrochlear lymph nodes normal sensation in the digit normal   MEDICAL DECISION SECTION  xrays ordered? no  My independent reading of xrays: X-ray shows nondisplaced fracture proximal phalanx left small finger minimal angulation   Encounter Diagnosis  Name Primary?  . Closed nondisplaced fracture of proximal phalanx of left little finger, initial encounter Yes     PLAN:     Recommend buddy taping active range of motion emphasized prevention of stiffness

## 2017-07-12 ENCOUNTER — Encounter: Payer: Self-pay | Admitting: Family Medicine

## 2017-07-12 ENCOUNTER — Ambulatory Visit (INDEPENDENT_AMBULATORY_CARE_PROVIDER_SITE_OTHER): Payer: Medicare Other | Admitting: Family Medicine

## 2017-07-12 DIAGNOSIS — I480 Paroxysmal atrial fibrillation: Secondary | ICD-10-CM

## 2017-07-12 LAB — PT WITH INR/FINGERSTICK
INR, fingerstick: 2.4 ratio — ABNORMAL HIGH
PT, fingerstick: 29.1 s — ABNORMAL HIGH (ref 10.5–13.1)

## 2017-07-12 NOTE — Progress Notes (Signed)
Subjective:    Patient ID: [name], male    DOB: 1934/03/10, 84 y.o.   MRN: [id number]  HPI  Patient is here today to recheck his INR.  He has a history of atrial fibrillation.  INR is therapeutic at 2.4.  He denies any bleeding or bruising on Coumadin.  He is compliant with his medication.  His heart rate is well controlled today.  His listed heart rate is 54 bpm however on exam, his irregularly irregular rhythm is more consistently 60-65 bpm.  He also recently fractured his fifth digit after a fall.  The proximal phalanx was impacted and slightly angulated.  He is following up with his orthopedic doctor May 3.  It appears to be healing well without complication.  He also wants his blood pressure checked.  Systolic blood pressure slightly elevated at 148 however the patient suffers dizziness and lightheadedness at times.  Therefore I am willing to accept a systolic blood pressure between 140 and 150. Past Medical History:  Diagnosis Date  . Atrial flutter ([place])   . BPH (benign prostatic hyperplasia)   . Coronary artery disease    a. s/p MI tx with Cypher DES to pRCA in 4/04;  b. Echocardiogram 7/09: Normal LV function.  c. Nuclear study 3/13 no ischemia;  d. ETT 2/14 neg;  e. admx with CP => LHC (01/30/2013):  pLAD 40-50%, oD1 70-80%, oOM1 40%, pRCA stent patent, mid RCA 30%, EF 60-65%. => med Rx.  . DDD (degenerative disc disease)   . Diabetes mellitus without complication ([place])   . Dyslipidemia   . Hemorrhoids   . Hx MRSA infection    left buttocks abscess  . Hx of echocardiogram    a. Echocardiogram (01/31/2013): Mild focal basal and mild concentric hypertrophy of the septum, EF 50-55%, normal wall motion, grade 1 diastolic dysfunction, trivial AI, MAC, mild LAE, PASP 35  . Hyperlipidemia   . Hypertension   . Hypothyroidism   . Meniere's disease    Status post shunt  . Myocardial infarction ([place]) 2008  . Occlusion and stenosis of carotid artery without mention of cerebral  infarction    40-59% on carotid doppler 2014; [place] (01/2013): R 1-39%, L 60-79%  . Rotator cuff injury    chronic rotator cuff injury status post repair  . Seizure disorder ([place])   . Stroke [facility])    a. 01/2013=> post cardiac cath CVA to L post communicating artery system; R sided weakness  . Syncope    Past Surgical History:  Procedure Laterality Date  . COLONOSCOPY    . CORONARY ANGIOPLASTY WITH STENT PLACEMENT  07/14/2002   Stent to the right coronary artery  . ENDOLYMPHATIC SHUNT DECOMPRESSION  06/11/2009    Right endolymphatic sac decompression and shunt  placement  . HERNIA REPAIR  04/10/2008   scrotal hernia repair  . LEFT HEART CATHETERIZATION WITH CORONARY ANGIOGRAM N/A 01/30/2013   Procedure: LEFT HEART CATHETERIZATION WITH CORONARY ANGIOGRAM;  Surgeon: Larey Dresser, MD;  Location: [facility] CATH LAB;  Service: Cardiovascular;  Laterality: N/A;  . ROTATOR CUFF REPAIR     for chronic rotator cuff injury   Current Outpatient Medications on File Prior to Visit  Medication Sig Dispense Refill  . albuterol (PROVENTIL HFA;VENTOLIN HFA) 108 (90 Base) MCG/ACT inhaler Inhale 2 puffs into the lungs every 4 (four) hours as needed for wheezing or shortness of breath. 1 Inhaler 0  . atorvastatin (LIPITOR) 80 MG tablet TAKE 1 TABLET BY MOUTH EVERY DAY 90  tablet 3  . carbamazepine (CARBATROL) 300 MG 12 hr capsule TAKE ONE CAPSULE BY MOUTH TWICE A DAY 180 capsule 3  . cloNIDine (CATAPRES) 0.2 MG tablet Take 1 tablet (0.2 mg total) by mouth 2 (two) times daily. 180 tablet 3  . diazepam (VALIUM) 2 MG tablet Take 1 tablet (2 mg total) by mouth 2 (two) times daily. Take 2 mg by mouth two times a day 60 tablet 2  . escitalopram (LEXAPRO) 10 MG tablet Take 1 tablet (10 mg total) by mouth daily. 90 tablet 3  . fluticasone (FLONASE) 50 MCG/ACT nasal spray Place 2 sprays into both nostrils daily. 16 g 6  . furosemide (LASIX) 20 MG tablet Take 1 tablet (20 mg total) by mouth daily. 90 tablet 3  .  isosorbide mononitrate (IMDUR) 30 MG 24 hr tablet Take 1 tablet (30 mg total) daily by mouth. 90 tablet 1  . levothyroxine (SYNTHROID, LEVOTHROID) 75 MCG tablet TAKE 1 TABLET BY MOUTH EVERY DAY 90 tablet 3  . losartan (COZAAR) 100 MG tablet TAKE 1/2 TABLET BY MOUTH 2 TIMES A DAY 90 tablet 3  . metFORMIN (GLUCOPHAGE) 500 MG tablet TAKE 1 TABLET BY MOUTH TWICE A DAY WITH A MEAL 180 tablet 2  . metoprolol tartrate (LOPRESSOR) 100 MG tablet Take 1 tablet (100 mg total) by mouth 2 (two) times daily. 180 tablet 3  . Multiple Vitamins-Minerals (MULTIVITAMINS THER. W/MINERALS) TABS Take 1 tablet by mouth at bedtime.      Marland Kitchen warfarin (COUMADIN) 7.5 MG tablet 1.5 tabs M/W/F and 1 tab all other days 180 tablet 3   No current facility-administered medications on file prior to visit.    No Known Allergies Social History   Socioeconomic History  . Marital status: Married    Spouse name: Not on file  . Number of children: 4  . Years of education: 12th  . Highest education level: Not on file  Occupational History  . Occupation: Retired  Scientific laboratory technician  . Financial resource strain: Not on file  . Food insecurity:    Worry: Not on file    Inability: Not on file  . Transportation needs:    Medical: Not on file    Non-medical: Not on file  Tobacco Use  . Smoking status: Former Smoker    Years: 5.00    Types: Cigarettes    Last attempt to quit: 08/19/1956    Years since quitting: 60.9  . Smokeless tobacco: Former Systems developer    Types: Chew  Substance and Sexual Activity  . Alcohol use: No  . Drug use: No  . Sexual activity: Never  Lifestyle  . Physical activity:    Days per week: Not on file    Minutes per session: Not on file  . Stress: Not on file  Relationships  . Social connections:    Talks on phone: Not on file    Gets together: Not on file    Attends religious service: Not on file    Active member of club or organization: Not on file    Attends meetings of clubs or organizations: Not on  file    Relationship status: Not on file  . Intimate partner violence:    Fear of current or ex partner: Not on file    Emotionally abused: Not on file    Physically abused: Not on file    Forced sexual activity: Not on file  Other Topics Concern  . Not on file  Social History Narrative  Lives with wife.    Review of Systems  All other systems reviewed and are negative.      Objective:   Physical Exam  Constitutional: He appears well-developed and well-nourished.  Cardiovascular: An irregularly irregular rhythm present.  Pulmonary/Chest: Effort normal and breath sounds normal. No respiratory distress. He has no wheezes. He has no rales.  Abdominal: Soft. Bowel sounds are normal. He exhibits no distension. There is no tenderness. There is no rebound.  Musculoskeletal: He exhibits no edema.  Vitals reviewed.         Assessment & Plan:  Paroxysmal atrial fibrillation ([place]) - Plan: PT with INR/Fingerstick  INR is therapeutic.  Make no changes in his Coumadin dose.  Blood pressure is acceptable given his advanced age particular given his lightheadedness.  Fracture to fifth phalanx appears to be healing well.  Follow-up as planned with orthopedist on May 3.  Continue buddy taping at the present time.  I did recommend an over-the-counter antihistamine due to nasal congestion, postnasal drip, and cough that the patient is experiencing with seasonal allergies

## 2017-07-16 ENCOUNTER — Other Ambulatory Visit: Payer: Self-pay | Admitting: Internal Medicine

## 2017-07-16 DIAGNOSIS — R05 Cough: Secondary | ICD-10-CM

## 2017-07-16 DIAGNOSIS — R058 Other specified cough: Secondary | ICD-10-CM

## 2017-07-18 ENCOUNTER — Encounter: Payer: Self-pay | Admitting: Family Medicine

## 2017-07-18 ENCOUNTER — Ambulatory Visit (INDEPENDENT_AMBULATORY_CARE_PROVIDER_SITE_OTHER): Payer: Medicare Other | Admitting: Family Medicine

## 2017-07-18 VITALS — BP 170/80 | HR 60 | Temp 97.9°F | Resp 16 | Ht 65.0 in | Wt 220.0 lb

## 2017-07-18 DIAGNOSIS — R319 Hematuria, unspecified: Secondary | ICD-10-CM | POA: Diagnosis not present

## 2017-07-18 DIAGNOSIS — B3749 Other urogenital candidiasis: Secondary | ICD-10-CM | POA: Diagnosis not present

## 2017-07-18 DIAGNOSIS — R310 Gross hematuria: Secondary | ICD-10-CM | POA: Diagnosis not present

## 2017-07-18 DIAGNOSIS — N3090 Cystitis, unspecified without hematuria: Secondary | ICD-10-CM | POA: Diagnosis not present

## 2017-07-18 MED ORDER — CLOTRIMAZOLE 1 % EX CREA: 1.0000 "application " | TOPICAL_CREAM | Freq: Two times a day (BID) | CUTANEOUS | 0 refills | Status: DC

## 2017-07-18 MED ORDER — CEPHALEXIN 500 MG PO CAPS
500.0000 mg | ORAL_CAPSULE | Freq: Three times a day (TID) | ORAL | 0 refills | Status: DC
Start: 2017-07-18 — End: 2018-02-13

## 2017-07-18 NOTE — Progress Notes (Signed)
Subjective:    Patient ID: [name], male    DOB: 03-26-1933, 84 y.o.   MRN: [id number]  HPI  Patient reports one episode of gross hematuria on Friday.  He states that with that episode he experienced dysuria.  He also reports a foul odor to his urine.  He only witnessed gross hematuria one time on Friday however he continues to experience dysuria on Saturday and Sunday.  This morning the dysuria has improved some.  Urinalysis shows blood as well as leukocyte esterase.  It shows 0-5 white blood cells per high-powered field.  However he complains of pain and itching in the head of his penis.  He is uncircumcised.  Due to body habitus, his penis is retracted within his body.  The foreskin therefore is unable to be reduced around the head of his penis.  Foreskin is erythematous and swollen and tender from what looks like chronic moisture, irritation, and possible yeast. Past Medical History:  Diagnosis Date  . Atrial flutter ([place])   . BPH (benign prostatic hyperplasia)   . Coronary artery disease    a. s/p MI tx with Cypher DES to pRCA in 4/04;  b. Echocardiogram 7/09: Normal LV function.  c. Nuclear study 3/13 no ischemia;  d. ETT 2/14 neg;  e. admx with CP => LHC (01/30/2013):  pLAD 40-50%, oD1 70-80%, oOM1 40%, pRCA stent patent, mid RCA 30%, EF 60-65%. => med Rx.  . DDD (degenerative disc disease)   . Diabetes mellitus without complication ([place])   . Dyslipidemia   . Hemorrhoids   . Hx MRSA infection    left buttocks abscess  . Hx of echocardiogram    a. Echocardiogram (01/31/2013): Mild focal basal and mild concentric hypertrophy of the septum, EF 50-55%, normal wall motion, grade 1 diastolic dysfunction, trivial AI, MAC, mild LAE, PASP 35  . Hyperlipidemia   . Hypertension   . Hypothyroidism   . Meniere's disease    Status post shunt  . Myocardial infarction ([place]) 2008  . Occlusion and stenosis of carotid artery without mention of cerebral infarction    40-59% on carotid  doppler 2014; [place] (01/2013): R 1-39%, L 60-79%  . Rotator cuff injury    chronic rotator cuff injury status post repair  . Seizure disorder ([place])   . Stroke [facility])    a. 01/2013=> post cardiac cath CVA to L post communicating artery system; R sided weakness  . Syncope    Past Surgical History:  Procedure Laterality Date  . COLONOSCOPY    . CORONARY ANGIOPLASTY WITH STENT PLACEMENT  07/14/2002   Stent to the right coronary artery  . ENDOLYMPHATIC SHUNT DECOMPRESSION  06/11/2009    Right endolymphatic sac decompression and shunt  placement  . HERNIA REPAIR  04/10/2008   scrotal hernia repair  . LEFT HEART CATHETERIZATION WITH CORONARY ANGIOGRAM N/A 01/30/2013   Procedure: LEFT HEART CATHETERIZATION WITH CORONARY ANGIOGRAM;  Surgeon: Larey Dresser, MD;  Location: [facility] CATH LAB;  Service: Cardiovascular;  Laterality: N/A;  . ROTATOR CUFF REPAIR     for chronic rotator cuff injury   Current Outpatient Medications on File Prior to Visit  Medication Sig Dispense Refill  . albuterol (PROVENTIL HFA;VENTOLIN HFA) 108 (90 Base) MCG/ACT inhaler Inhale 2 puffs into the lungs every 4 (four) hours as needed for wheezing or shortness of breath. 1 Inhaler 0  . atorvastatin (LIPITOR) 80 MG tablet TAKE 1 TABLET BY MOUTH EVERY DAY 90 tablet 3  . carbamazepine (CARBATROL) 300  MG 12 hr capsule TAKE ONE CAPSULE BY MOUTH TWICE A DAY 180 capsule 3  . cloNIDine (CATAPRES) 0.2 MG tablet Take 1 tablet (0.2 mg total) by mouth 2 (two) times daily. 180 tablet 3  . diazepam (VALIUM) 2 MG tablet Take 1 tablet (2 mg total) by mouth 2 (two) times daily. Take 2 mg by mouth two times a day 60 tablet 2  . escitalopram (LEXAPRO) 10 MG tablet Take 1 tablet (10 mg total) by mouth daily. 90 tablet 3  . fluticasone (FLONASE) 50 MCG/ACT nasal spray Place 2 sprays into both nostrils daily. 16 g 6  . furosemide (LASIX) 20 MG tablet Take 1 tablet (20 mg total) by mouth daily. 90 tablet 3  . isosorbide mononitrate (IMDUR) 30 MG 24 hr  tablet Take 1 tablet (30 mg total) daily by mouth. 90 tablet 1  . levothyroxine (SYNTHROID, LEVOTHROID) 75 MCG tablet TAKE 1 TABLET BY MOUTH EVERY DAY 90 tablet 3  . losartan (COZAAR) 100 MG tablet TAKE 1/2 TABLET BY MOUTH 2 TIMES A DAY 90 tablet 3  . metFORMIN (GLUCOPHAGE) 500 MG tablet TAKE 1 TABLET BY MOUTH TWICE A DAY WITH A MEAL 180 tablet 2  . metoprolol tartrate (LOPRESSOR) 100 MG tablet Take 1 tablet (100 mg total) by mouth 2 (two) times daily. 180 tablet 3  . Multiple Vitamins-Minerals (MULTIVITAMINS THER. W/MINERALS) TABS Take 1 tablet by mouth at bedtime.      Marland Kitchen warfarin (COUMADIN) 7.5 MG tablet 1.5 tabs M/W/F and 1 tab all other days 180 tablet 3   No current facility-administered medications on file prior to visit.    No Known Allergies Social History   Socioeconomic History  . Marital status: Married    Spouse name: Not on file  . Number of children: 4  . Years of education: 12th  . Highest education level: Not on file  Occupational History  . Occupation: Retired  Scientific laboratory technician  . Financial resource strain: Not on file  . Food insecurity:    Worry: Not on file    Inability: Not on file  . Transportation needs:    Medical: Not on file    Non-medical: Not on file  Tobacco Use  . Smoking status: Former Smoker    Years: 5.00    Types: Cigarettes    Last attempt to quit: 08/19/1956    Years since quitting: 60.9  . Smokeless tobacco: Former Systems developer    Types: Chew  Substance and Sexual Activity  . Alcohol use: No  . Drug use: No  . Sexual activity: Never  Lifestyle  . Physical activity:    Days per week: Not on file    Minutes per session: Not on file  . Stress: Not on file  Relationships  . Social connections:    Talks on phone: Not on file    Gets together: Not on file    Attends religious service: Not on file    Active member of club or organization: Not on file    Attends meetings of clubs or organizations: Not on file    Relationship status: Not on file  .  Intimate partner violence:    Fear of current or ex partner: Not on file    Emotionally abused: Not on file    Physically abused: Not on file    Forced sexual activity: Not on file  Other Topics Concern  . Not on file  Social History Narrative   Lives with wife.    Review  of Systems  All other systems reviewed and are negative.      Objective:   Physical Exam  Constitutional: He appears well-developed and well-nourished.  Cardiovascular: An irregularly irregular rhythm present.  Pulmonary/Chest: Effort normal and breath sounds normal. No respiratory distress. He has no wheezes. He has no rales.  Abdominal: Soft. Bowel sounds are normal. He exhibits no distension. There is no tenderness. There is no rebound. Hernia confirmed negative in the right inguinal area and confirmed negative in the left inguinal area.  Genitourinary: Testes normal. Uncircumcised. Penile erythema and penile tenderness present.  Musculoskeletal: He exhibits no edema.  Lymphadenopathy:       Right: No inguinal adenopathy present.       Left: No inguinal adenopathy present.  Vitals reviewed.         Assessment & Plan:  Hematuria, unspecified type - Plan: Urinalysis, Routine w reflex microscopic  Gross hematuria  Cystitis  Candidal balanoposthitis  Due to the fact, the penis is shorter in length and he is uncircumcised, I believe he has developed Candida balanoposthitis.  I have recommended Lotrimin cream be applied to the head of the penis as well as to the foreskin 2 times a day for the next 2 weeks and then recheck.  I am uncertain of the cause of his gross hematuria.  However with the dysuria, white blood cells seen under high-power field, I am concerned about possible urinary tract infection/cystitis.  I will send a urine culture.  Meanwhile I will treat the patient with Keflex 500 mg p.o. 3 times daily for 7 days.  Hematuria may have been magnified due to the fact the patient is on Coumadin.   However it appears to be resolving and improving gradually.  Recheck in 2 weeks as we have discussed.  If hematuria persists, would recommend a CT scan to evaluate for any pathologic lesions on the kidneys or ureters as well as a cystoscopy to evaluate for any pathologic bladder lesions.

## 2017-07-18 NOTE — Addendum Note (Signed)
Addended by: Shary Decamp B on: 07/18/2017 12:39 PM   Modules accepted: Orders

## 2017-07-19 LAB — URINALYSIS, ROUTINE W REFLEX MICROSCOPIC
Bacteria, UA: NONE SEEN /HPF
Bilirubin Urine: NEGATIVE
Glucose, UA: NEGATIVE
Ketones, ur: NEGATIVE
Nitrite: NEGATIVE
Protein, ur: NEGATIVE
Specific Gravity, Urine: 1.015 (ref 1.001–1.03)
Squamous Epithelial / LPF: NONE SEEN /HPF (ref <=5)
pH: 6 (ref 5.0–8.0)

## 2017-07-19 LAB — URINE CULTURE
MICRO NUMBER:: 90518917
Result:: NO GROWTH
SPECIMEN QUALITY:: ADEQUATE

## 2017-07-19 LAB — MICROSCOPIC MESSAGE

## 2017-07-21 DIAGNOSIS — S62619A Displaced fracture of proximal phalanx of unspecified finger, initial encounter for closed fracture: Secondary | ICD-10-CM | POA: Insufficient documentation

## 2017-07-22 ENCOUNTER — Ambulatory Visit (INDEPENDENT_AMBULATORY_CARE_PROVIDER_SITE_OTHER): Payer: Medicare Other

## 2017-07-22 ENCOUNTER — Ambulatory Visit (INDEPENDENT_AMBULATORY_CARE_PROVIDER_SITE_OTHER): Payer: Self-pay | Admitting: Orthopedic Surgery

## 2017-07-22 ENCOUNTER — Encounter: Payer: Self-pay | Admitting: Orthopedic Surgery

## 2017-07-22 DIAGNOSIS — S62647D Nondisplaced fracture of proximal phalanx of left little finger, subsequent encounter for fracture with routine healing: Secondary | ICD-10-CM

## 2017-07-22 NOTE — Patient Instructions (Signed)
Stretching exercises as instructed

## 2017-07-22 NOTE — Progress Notes (Signed)
Fracture care  Fracture right small finger follow-up  Chief Complaint  Patient presents with  . Hand Injury    right fifth finger fracture 06/20/17    X-ray shows fracture healing minimal angulation  He has decreased flexion of my reviewed some exercises to stretch it and get the range of motion back  He will follow on an as-needed basis and do the exercises as prescribed

## 2017-07-23 ENCOUNTER — Other Ambulatory Visit: Payer: Self-pay | Admitting: Family Medicine

## 2017-07-25 ENCOUNTER — Other Ambulatory Visit: Payer: Self-pay | Admitting: Internal Medicine

## 2017-07-25 DIAGNOSIS — R05 Cough: Secondary | ICD-10-CM

## 2017-07-25 DIAGNOSIS — R058 Other specified cough: Secondary | ICD-10-CM

## 2017-08-20 ENCOUNTER — Other Ambulatory Visit: Payer: Self-pay | Admitting: Family Medicine

## 2017-09-26 DIAGNOSIS — H5213 Myopia, bilateral: Secondary | ICD-10-CM | POA: Diagnosis not present

## 2017-09-26 DIAGNOSIS — H524 Presbyopia: Secondary | ICD-10-CM | POA: Diagnosis not present

## 2017-09-26 DIAGNOSIS — H52223 Regular astigmatism, bilateral: Secondary | ICD-10-CM | POA: Diagnosis not present

## 2017-09-26 DIAGNOSIS — H353132 Nonexudative age-related macular degeneration, bilateral, intermediate dry stage: Secondary | ICD-10-CM | POA: Diagnosis not present

## 2017-09-26 DIAGNOSIS — H04123 Dry eye syndrome of bilateral lacrimal glands: Secondary | ICD-10-CM | POA: Diagnosis not present

## 2017-09-26 DIAGNOSIS — E119 Type 2 diabetes mellitus without complications: Secondary | ICD-10-CM | POA: Diagnosis not present

## 2017-09-26 DIAGNOSIS — H26493 Other secondary cataract, bilateral: Secondary | ICD-10-CM | POA: Diagnosis not present

## 2017-09-26 LAB — HM DIABETES EYE EXAM

## 2017-09-30 ENCOUNTER — Encounter: Payer: Self-pay | Admitting: *Deleted

## 2017-10-18 ENCOUNTER — Other Ambulatory Visit: Payer: Self-pay | Admitting: Family Medicine

## 2017-10-22 ENCOUNTER — Other Ambulatory Visit: Payer: Self-pay | Admitting: Family Medicine

## 2017-11-10 ENCOUNTER — Other Ambulatory Visit: Payer: Self-pay | Admitting: Family Medicine

## 2017-11-10 MED ORDER — CLONIDINE HCL 0.2 MG PO TABS: 0.2000 mg | ORAL_TABLET | Freq: Two times a day (BID) | ORAL | 3 refills | Status: DC

## 2017-11-10 NOTE — Addendum Note (Signed)
Addended by: Shary Decamp B on: 11/10/2017 09:20 AM   Modules accepted: Orders

## 2017-11-15 ENCOUNTER — Other Ambulatory Visit: Payer: Self-pay | Admitting: Cardiovascular Disease

## 2017-11-15 DIAGNOSIS — I6523 Occlusion and stenosis of bilateral carotid arteries: Secondary | ICD-10-CM

## 2017-11-18 ENCOUNTER — Other Ambulatory Visit: Payer: Self-pay | Admitting: Family Medicine

## 2017-11-19 ENCOUNTER — Other Ambulatory Visit: Payer: Self-pay | Admitting: Family Medicine

## 2017-12-14 ENCOUNTER — Ambulatory Visit (HOSPITAL_COMMUNITY)
Admission: RE | Admit: 2017-12-14 | Discharge: 2017-12-14 | Disposition: A | Discharge status: Home / Self Care | Payer: Medicare Other | Source: Ambulatory Visit | Attending: Cardiovascular Disease | Admitting: Cardiovascular Disease

## 2017-12-14 DIAGNOSIS — I6523 Occlusion and stenosis of bilateral carotid arteries: Secondary | ICD-10-CM | POA: Diagnosis not present

## 2017-12-21 ENCOUNTER — Other Ambulatory Visit: Payer: Self-pay | Admitting: Family Medicine

## 2017-12-22 ENCOUNTER — Encounter: Payer: Self-pay | Admitting: *Deleted

## 2018-01-16 ENCOUNTER — Other Ambulatory Visit: Payer: Self-pay | Admitting: Family Medicine

## 2018-01-31 ENCOUNTER — Ambulatory Visit (INDEPENDENT_AMBULATORY_CARE_PROVIDER_SITE_OTHER): Payer: Medicare Other

## 2018-01-31 DIAGNOSIS — Z23 Encounter for immunization: Secondary | ICD-10-CM

## 2018-01-31 NOTE — Progress Notes (Signed)
Patient came in today to receive annual flu vaccine. Fluzone was given in the left deltoid. Patient tolerated well. VIS was given.

## 2018-02-09 ENCOUNTER — Encounter: Payer: Self-pay | Admitting: Family Medicine

## 2018-02-09 DIAGNOSIS — Z79899 Other long term (current) drug therapy: Secondary | ICD-10-CM | POA: Diagnosis not present

## 2018-02-09 DIAGNOSIS — I10 Essential (primary) hypertension: Secondary | ICD-10-CM | POA: Diagnosis not present

## 2018-02-09 DIAGNOSIS — Z8673 Personal history of transient ischemic attack (TIA), and cerebral infarction without residual deficits: Secondary | ICD-10-CM | POA: Diagnosis not present

## 2018-02-09 DIAGNOSIS — Z7901 Long term (current) use of anticoagulants: Secondary | ICD-10-CM | POA: Diagnosis not present

## 2018-02-09 DIAGNOSIS — Z87891 Personal history of nicotine dependence: Secondary | ICD-10-CM | POA: Diagnosis not present

## 2018-02-09 DIAGNOSIS — R29810 Facial weakness: Secondary | ICD-10-CM | POA: Diagnosis not present

## 2018-02-09 DIAGNOSIS — R569 Unspecified convulsions: Secondary | ICD-10-CM | POA: Diagnosis not present

## 2018-02-09 DIAGNOSIS — R55 Syncope and collapse: Secondary | ICD-10-CM | POA: Diagnosis not present

## 2018-02-10 ENCOUNTER — Other Ambulatory Visit: Payer: Self-pay | Admitting: Family Medicine

## 2018-02-10 NOTE — Telephone Encounter (Signed)
Ok to refill??  Last office visit 07/15/2017.  Last refill 03/23/2016, #2 refills.

## 2018-02-13 ENCOUNTER — Ambulatory Visit (INDEPENDENT_AMBULATORY_CARE_PROVIDER_SITE_OTHER): Payer: Medicare Other | Admitting: Family Medicine

## 2018-02-13 ENCOUNTER — Encounter: Payer: Self-pay | Admitting: Family Medicine

## 2018-02-13 ENCOUNTER — Encounter: Payer: Self-pay | Admitting: *Deleted

## 2018-02-13 ENCOUNTER — Other Ambulatory Visit: Payer: Self-pay

## 2018-02-13 VITALS — BP 134/68 | HR 70 | Temp 98.4°F | Resp 14 | Ht 65.0 in | Wt 219.0 lb

## 2018-02-13 DIAGNOSIS — R55 Syncope and collapse: Secondary | ICD-10-CM

## 2018-02-13 DIAGNOSIS — I6523 Occlusion and stenosis of bilateral carotid arteries: Secondary | ICD-10-CM

## 2018-02-13 DIAGNOSIS — R1310 Dysphagia, unspecified: Secondary | ICD-10-CM

## 2018-02-13 NOTE — Patient Instructions (Signed)
F/U [name] 2 months

## 2018-02-13 NOTE — Progress Notes (Signed)
   Subjective:    Patient ID: [name], male    DOB: March 08, 1934, 84 y.o.   MRN: [id number]  Patient presents for ER F/U (syncope/ collapse- states that he felt like he got chicken stuck in windpipe- was able to cough it up, but then passed out)   Last Thursday was out at a restaurant eating meat he felt it get stuck started coughing and then passed out. EMS was called and sent to [facility].   Wife states he was shaking a lot?  history of seizure disorder, described as episodes at night where he would hear a certain song ( Dixie Chicks), no shaking movements, was on medications in he past, has not had an episode in about 10 years or more. He came to at the restaurant, was not confused   He has had chronic problems with meats, dry foods getting stuck,he is also treated for upper airway cough syndrome by pulmonary. No difficulty swallowing his pills   Cardiology - recent carotid duplex in Sept, with mild plaque on Right and 40-60% on left   He is back at his baseline   No records to review but his patient handout shows Cardiac workup, CXR, CT Head done  Review Of Systems:  GEN- denies fatigue, fever, weight loss,weakness, recent illness HEENT- denies eye drainage, change in vision, nasal discharge, CVS- denies chest pain, palpitations RESP- denies SOB, cough, wheeze ABD- denies N/V, change in stools, abd pain GU- denies dysuria, hematuria, dribbling, incontinence MSK- denies joint pain, muscle aches, injury Neuro- denies headache, dizziness, +syncope, seizure activity       Objective:    BP 134/68   Pulse 70   Temp 98.4 F (36.9 C) (Oral)   Resp 14   Ht 5\' 5"  (1.651 m)   Wt 219 lb (99.3 kg)   SpO2 98%   BMI 36.44 kg/m  GEN- NAD, alert and oriented x3 HEENT- PERRL, EOMI, non injected sclera, pink conjunctiva, MMM, oropharynx clear Neck- Supple, no LAD CVS- RRR, no murmur RESP-CTAB ABD-NABS,soft,NT,ND Neuro- CNII-XII in tact, no new deficits, has some  weakness RUE compared to left  EXT- No edema Pulses- Radial  2+        Assessment & Plan:      Problem List Items Addressed This Visit    None    Visit Diagnoses    Syncope and collapse    -  Primary   This sounds like a valsava manuerver causing syncope, no true seizure activty, will obtain records from [facility] in [place]. He is at his baseline, bp looks good,   Dysphagia, unspecified type       to dry foods, discussed drinking water, he has had this for years evidently, discussed GI for EGD possible dilitation ,he wants to hold off for now      Note: This dictation was prepared with Dragon dictation along with smaller phrase technology. Any transcriptional errors that result from this process are unintentional.

## 2018-02-15 ENCOUNTER — Other Ambulatory Visit: Payer: Self-pay | Admitting: Family Medicine

## 2018-03-17 ENCOUNTER — Encounter: Payer: Self-pay | Admitting: Family Medicine

## 2018-03-17 ENCOUNTER — Ambulatory Visit (INDEPENDENT_AMBULATORY_CARE_PROVIDER_SITE_OTHER): Payer: Medicare Other | Admitting: Family Medicine

## 2018-03-17 ENCOUNTER — Other Ambulatory Visit: Payer: Self-pay

## 2018-03-17 VITALS — BP 138/64 | HR 82 | Temp 98.3°F | Resp 14 | Ht 65.0 in | Wt 220.0 lb

## 2018-03-17 DIAGNOSIS — J209 Acute bronchitis, unspecified: Secondary | ICD-10-CM | POA: Diagnosis not present

## 2018-03-17 DIAGNOSIS — I480 Paroxysmal atrial fibrillation: Secondary | ICD-10-CM

## 2018-03-17 DIAGNOSIS — J069 Acute upper respiratory infection, unspecified: Secondary | ICD-10-CM | POA: Diagnosis not present

## 2018-03-17 LAB — PT WITH INR/FINGERSTICK
INR, fingerstick: 3.8 ratio — ABNORMAL HIGH
PT, fingerstick: 45.3 s — ABNORMAL HIGH (ref 10.5–13.1)

## 2018-03-17 MED ORDER — BENZONATATE 100 MG PO CAPS: 100.0000 mg | ORAL_CAPSULE | Freq: Three times a day (TID) | ORAL | 0 refills | Status: DC | PRN

## 2018-03-17 MED ORDER — PREDNISONE 20 MG PO TABS: ORAL_TABLET | ORAL | 0 refills | Status: DC

## 2018-03-17 MED ORDER — ALBUTEROL SULFATE HFA 108 (90 BASE) MCG/ACT IN AERS: 2.0000 | INHALATION_SPRAY | RESPIRATORY_TRACT | 0 refills | Status: DC | PRN

## 2018-03-17 MED ORDER — GUAIFENESIN ER 600 MG PO TB12: 600.0000 mg | ORAL_TABLET | Freq: Two times a day (BID) | ORAL | 0 refills | Status: AC

## 2018-03-17 NOTE — Progress Notes (Signed)
Patient ID: [name], male    DOB: [birth year], 84 y.o.   MRN: [id number]  PCP: Daniel Frizzle, MD  Chief Complaint  Patient presents with  . Illness    x3 days productive cough, green mucus, head congestion    Subjective:   [name] is a 84 y.o. male, presents to clinic with CC of 3 days of productive cough that he describes as severe associated with shortness of breath with nasal congestion and discharge.  His wife has been ill with same symptoms at home except she improved after 1 day and he has been getting gradually worse.  He has taken cough medicines, Coricidin and he has not improved much.  He states coughing is most severe at night and worse in the morning, but also gradually worsening.  When he is not having coughing fits he does not feel short of breath and he denies any wheeze or chest pain.  Sputum is light green in color.  He states that his nasal discharge is profuse, constantly dripping, described as clear and associated with frequent sneezing and postnasal drip.  He denies any sinus tenderness to palpation, he states his energy is good he has not been fatigued, he denies fever, chills, night sweats, unintentional weight loss.  Patient also states that he used to get bronchitis a lot and does have a inhaler, previously prescribed for him but he believes it is expired, he has not tried it with his coughing.   Patient Active Problem List   Diagnosis Date Noted  . Fracture of proximal phalanx of finger fifth 06/17/2017 07/21/2017  . Upper airway cough syndrome 11/01/2016  . Atrial flutter ([place]) 08/05/2016  . Coronary artery disease involving native coronary artery of native heart without angina pectoris 08/05/2016  . Chronic anticoagulation 12/31/2015  . History of seizures 12/31/2015  . Acute diastolic CHF (congestive heart failure) ([place]) 12/24/2015  . Pulmonary edema   . Paroxysmal atrial fibrillation ([facility])   . Hypothyroidism   . Controlled type 2  diabetes mellitus with complication, without long-term current use of insulin ([place])   . Hypertensive emergency 12/20/2015  . Special screening for malignant neoplasms, colon 12/07/2013  . Bowel habit changes 12/07/2013  . Diarrhea 03/09/2013  . History of cardioembolic cerebrovascular accident (CVA) 01/31/2013  . Chest pain 01/29/2013  . Bilateral carotid artery disease ([place])   . Loss of coordination 12/03/2012  . Morbid obesity due to excess calories ([place]) 08/20/2011  . CAD S/P percutaneous coronary angioplasty   . Dyslipidemia   . Hypertension      Prior to Admission medications   Medication Sig Start Date End Date Taking? Authorizing Provider  albuterol (PROVENTIL HFA;VENTOLIN HFA) 108 (90 Base) MCG/ACT inhaler Inhale 2 puffs into the lungs every 4 (four) hours as needed for wheezing or shortness of breath. 03/08/16  Yes Martin, Modena Nunnery, MD  atorvastatin (LIPITOR) 80 MG tablet TAKE 1 TABLET BY MOUTH EVERY DAY 07/25/17  Yes Daniel Frizzle, MD  carbamazepine (CARBATROL) 300 MG 12 hr capsule TAKE 1 CAPSULE BY MOUTH TWICE A DAY 10/24/17  Yes Daniel Frizzle, MD  cloNIDine (CATAPRES) 0.2 MG tablet Take 1 tablet (0.2 mg total) by mouth 2 (two) times daily. 11/10/17  Yes Daniel Frizzle, MD  clotrimazole (LOTRIMIN AF) 1 % cream Apply 1 application topically 2 (two) times daily. 07/18/17  Yes Daniel Frizzle, MD  diazepam (VALIUM) 2 MG tablet TAKE 1 TABLET BY MOUTH TWICE A DAY AS NEEDED  02/10/18  Yes Daniel Frizzle, MD  escitalopram (LEXAPRO) 10 MG tablet TAKE 1 TABLET BY MOUTH EVERY DAY 12/21/17  Yes Daniel Frizzle, MD  fluticasone [facility]) 50 MCG/ACT nasal spray Place 2 sprays into both nostrils daily. 09/08/16  Yes Daniel Frizzle, MD  furosemide (LASIX) 20 MG tablet TAKE 1 TABLET BY MOUTH EVERY DAY 11/22/17  Yes Daniel Frizzle, MD  isosorbide mononitrate (IMDUR) 30 MG 24 hr tablet TAKE 1 TABLET BY MOUTH EVERY DAY 02/15/18  Yes Daniel Frizzle, MD  levothyroxine (SYNTHROID,  LEVOTHROID) 75 MCG tablet TAKE 1 TABLET BY MOUTH EVERY DAY 03/23/17  Yes Daniel Frizzle, MD  losartan (COZAAR) 100 MG tablet TAKE 1/2 TABLET BY MOUTH 2 TIMES A DAY 10/18/17  Yes Daniel Frizzle, MD  metFORMIN (GLUCOPHAGE) 500 MG tablet TAKE 1 TABLET BY MOUTH TWICE A DAY WITH A MEAL 06/10/17  Yes Daniel Frizzle, MD  metoprolol tartrate (LOPRESSOR) 100 MG tablet TAKE 1 TABLET BY MOUTH TWICE A DAY 11/18/17  Yes [place], Modena Nunnery, MD  Multiple Vitamins-Minerals (MULTIVITAMINS THER. W/MINERALS) TABS Take 1 tablet by mouth at bedtime.     Yes [provider]  warfarin (COUMADIN) 7.5 MG tablet TAKE 1 AND 1/2 TABLET BY MOUTH MONDAY, WEDNESDAY, AND FRIDAY AND 1 TAB ON ALL OTHER DAYS 01/16/18  Yes Daniel Frizzle, MD     No Known Allergies   Family History  Problem Relation Age of Onset  . Heart attack Father 75  . Aneurysm Mother        brain aneurysm  . Coronary artery disease Brother        with CABG  . Diabetes Brother   . Colon cancer Neg Hx   . Colon polyps Neg Hx      Social History   Socioeconomic History  . Marital status: Married    Spouse name: Not on file  . Number of children: 4  . Years of education: 12th  . Highest education level: Not on file  Occupational History  . Occupation: Retired  Scientific laboratory technician  . Financial resource strain: Not on file  . Food insecurity:    Worry: Not on file    Inability: Not on file  . Transportation needs:    Medical: Not on file    Non-medical: Not on file  Tobacco Use  . Smoking status: Former Smoker    Years: 5.00    Types: Cigarettes    Last attempt to quit: 08/19/1956    Years since quitting: 61.6  . Smokeless tobacco: Former Systems developer    Types: Chew  Substance and Sexual Activity  . Alcohol use: No  . Drug use: No  . Sexual activity: Never  Lifestyle  . Physical activity:    Days per week: Not on file    Minutes per session: Not on file  . Stress: Not on file  Relationships  . Social connections:    Talks on  phone: Not on file    Gets together: Not on file    Attends religious service: Not on file    Active member of club or organization: Not on file    Attends meetings of clubs or organizations: Not on file    Relationship status: Not on file  . Intimate partner violence:    Fear of current or ex partner: Not on file    Emotionally abused: Not on file    Physically abused: Not on file    Forced sexual activity:  Not on file  Other Topics Concern  . Not on file  Social History Narrative   Lives with wife.     Review of Systems  Constitutional: Negative.   HENT: Negative.   Eyes: Negative.   Respiratory: Negative.   Cardiovascular: Negative.   Gastrointestinal: Negative.   Endocrine: Negative.   Genitourinary: Negative.   Musculoskeletal: Negative.   Skin: Negative.   Allergic/Immunologic: Negative.   Neurological: Negative.   Hematological: Negative.   Psychiatric/Behavioral: Negative.   All other systems reviewed and are negative.      Objective:    Vitals:   03/17/18 1035  BP: 138/64  Pulse: 82  Resp: 14  Temp: 98.3 F (36.8 C)  TempSrc: Oral  SpO2: 98%  Weight: 220 lb (99.8 kg)  Height: 5\' 5"  (1.651 m)      Physical Exam Vitals signs and nursing note reviewed.  Constitutional:      General: He is not in acute distress.    Appearance: Normal appearance. He is well-developed. He is not toxic-appearing or diaphoretic.     Comments: Elderly, appears stated age, NAD, non-toxic appearing  HENT:     Head: Normocephalic and atraumatic.     Jaw: No trismus.     Right Ear: Tympanic membrane, ear canal and external ear normal.     Left Ear: Tympanic membrane, ear canal and external ear normal.     Nose: Mucosal edema and rhinorrhea present. No congestion.     Right Sinus: No maxillary sinus tenderness or frontal sinus tenderness.     Left Sinus: No maxillary sinus tenderness or frontal sinus tenderness.     Mouth/Throat:     Mouth: Mucous membranes are moist.  Mucous membranes are not pale, not dry and not cyanotic.     Pharynx: Uvula midline. No oropharyngeal exudate, posterior oropharyngeal erythema or uvula swelling.     Tonsils: No tonsillar exudate or tonsillar abscesses.  Eyes:     General: Lids are normal.        Right eye: No discharge.        Left eye: No discharge.     Conjunctiva/sclera: Conjunctivae normal.     Pupils: Pupils are equal, round, and reactive to light.  Neck:     Musculoskeletal: Normal range of motion and neck supple.     Trachea: Trachea and phonation normal. No tracheal deviation.  Cardiovascular:     Rate and Rhythm: Normal rate and regular rhythm.     Pulses: Normal pulses.          Radial pulses are 2+ on the right side and 2+ on the left side.     Heart sounds: Normal heart sounds. No murmur. No friction rub. No gallop.   Pulmonary:     Effort: Pulmonary effort is normal. No tachypnea, accessory muscle usage or respiratory distress.     Breath sounds: No stridor. Wheezing and rhonchi present. No decreased breath sounds or rales.  Abdominal:     General: Bowel sounds are normal. There is no distension.     Palpations: Abdomen is soft.     Tenderness: There is no abdominal tenderness.  Musculoskeletal: Normal range of motion.  Skin:    General: Skin is warm and dry.     Capillary Refill: Capillary refill takes less than 2 seconds.     Coloration: Skin is not pale.     Findings: No rash.     Nails: There is no clubbing.   Neurological:  Mental Status: He is alert and oriented to person, place, and time.     Motor: No abnormal muscle tone.     Coordination: Coordination normal.     Gait: Gait normal.  Psychiatric:        Speech: Speech normal.        Behavior: Behavior normal. Behavior is cooperative.           Assessment & Plan:   3 days of URI sx, profuse clear nasal discharge and productive cough, severe with light green sputum and associated shortness of breath with coughing, patient  reports a history of bronchitis.  On exam he has scattered rhonchi, slightly diminished breath sounds bilaterally at the bases and faint expiratory wheeze, will treat for acute bronchitis with steroids, albuterol, Mucinex.  Likely viral illness, he currently does not have any signs of bacterial infection, afebrile, good energy, no sweats, chills, decreased appetite.  Because of the other medical history though it like him to follow-up closely if he has not improving with the 5 days of steroids treatment, and low threshold to obtain chest x-ray and/or treat with antibiotics.   Pt when leaving OV, stated he was due for PT/INR labs today.  There was a standing order I did have it activated and Kim in the lab was went to notify me of results prior to patient leaving so we can discuss.  I did go back to discuss results with the patient and [name] had already seen the patient and manage that, please see his documentation for further details.    ICD-10-CM   1. Acute bronchitis, unspecified organism J20.9 DG Chest 2 View    predniSONE (DELTASONE) 20 MG tablet    albuterol (PROVENTIL HFA;VENTOLIN HFA) 108 (90 Base) MCG/ACT inhaler    benzonatate (TESSALON) 100 MG capsule    guaiFENesin (MUCINEX) 600 MG 12 hr tablet  2. Upper respiratory tract infection, unspecified type J06.9   3. Paroxysmal atrial fibrillation (HCC) I48.0 PT with INR/Fingerstick       Delsa Grana, PA-C 03/17/18 10:45 AM

## 2018-03-17 NOTE — Patient Instructions (Signed)
Lets start treating your symptoms with supportive measures and with cough suppressants and treatment of acute bronchitis with steroids, albuterol inhaler and mucinex.     If you do not improve in 1-2 weeks would need to be rechecked and possibly get the chest X-ray  If you worsen at any point please let us know because with your medical history we would need to reexamine you.

## 2018-04-10 ENCOUNTER — Encounter: Payer: Self-pay | Admitting: Family Medicine

## 2018-04-10 ENCOUNTER — Ambulatory Visit (INDEPENDENT_AMBULATORY_CARE_PROVIDER_SITE_OTHER): Payer: Medicare Other | Admitting: Family Medicine

## 2018-04-10 DIAGNOSIS — I480 Paroxysmal atrial fibrillation: Secondary | ICD-10-CM

## 2018-04-10 LAB — PT WITH INR/FINGERSTICK
INR, fingerstick: 2.7 ratio — ABNORMAL HIGH
PT, fingerstick: 32 s — ABNORMAL HIGH (ref 10.5–13.1)

## 2018-04-10 NOTE — Progress Notes (Signed)
Subjective:    Patient ID: [name], male    DOB: 12-Nov-1933, 85 y.o.   MRN: [id number]  HPI Patient has a history of atrial fibrillation/atrial flutter.  He is on Coumadin.  I last saw the patient in April 2019.  At that time his INR was therapeutic however he has not returned for an INR check since.  I spent 10 minutes today discussing with the patient the need to have this checked every 4 to 6 weeks to ensure that the dose remains therapeutic and to avoid complications due to the Coumadin.  We also discussed switching to an anticoagulant such as Eliquis or Xarelto that does not require periodic blood monitoring.  I emphasized that if he remains on Coumadin he must have this checked every 6 weeks.  INR today is 2.7.  He is currently taking Coumadin 7-1/2 mg tablets.  He takes 1 tablet every day (7.5 mg) except Monday and Friday when he takes 1-1/2 tablets which is the equivalent to 11.25 mg.  Past Medical History:  Diagnosis Date  . Atrial flutter ([place])   . BPH (benign prostatic hyperplasia)   . Coronary artery disease    a. s/p MI tx with Cypher DES to pRCA in 4/04;  b. Echocardiogram 7/09: Normal LV function.  c. Nuclear study 3/13 no ischemia;  d. ETT 2/14 neg;  e. admx with CP => LHC (01/30/2013):  pLAD 40-50%, oD1 70-80%, oOM1 40%, pRCA stent patent, mid RCA 30%, EF 60-65%. => med Rx.  . DDD (degenerative disc disease)   . Diabetes mellitus without complication ([place])   . Dyslipidemia   . Hemorrhoids   . Hx MRSA infection    left buttocks abscess  . Hx of echocardiogram    a. Echocardiogram (01/31/2013): Mild focal basal and mild concentric hypertrophy of the septum, EF 50-55%, normal wall motion, grade 1 diastolic dysfunction, trivial AI, MAC, mild LAE, PASP 35  . Hyperlipidemia   . Hypertension   . Hypothyroidism   . Meniere's disease    Status post shunt  . Myocardial infarction ([name]) 2008  . Occlusion and stenosis of carotid artery without mention of cerebral infarction     40-59% on carotid doppler 2014; [place] (01/2013): R 1-39%, L 60-79%  . Rotator cuff injury    chronic rotator cuff injury status post repair  . Seizure disorder ([place])   . Stroke [facility])    a. 01/2013=> post cardiac cath CVA to L post communicating artery system; R sided weakness  . Syncope    Past Surgical History:  Procedure Laterality Date  . COLONOSCOPY    . CORONARY ANGIOPLASTY WITH STENT PLACEMENT  07/14/2002   Stent to the right coronary artery  . ENDOLYMPHATIC SHUNT DECOMPRESSION  06/11/2009    Right endolymphatic sac decompression and shunt  placement  . HERNIA REPAIR  04/10/2008   scrotal hernia repair  . LEFT HEART CATHETERIZATION WITH CORONARY ANGIOGRAM N/A 01/30/2013   Procedure: LEFT HEART CATHETERIZATION WITH CORONARY ANGIOGRAM;  Surgeon: Larey Dresser, MD;  Location: [facility] CATH LAB;  Service: Cardiovascular;  Laterality: N/A;  . ROTATOR CUFF REPAIR     for chronic rotator cuff injury   Current Outpatient Medications on File Prior to Visit  Medication Sig Dispense Refill  . albuterol (PROVENTIL HFA;VENTOLIN HFA) 108 (90 Base) MCG/ACT inhaler Inhale 2 puffs into the lungs every 4 (four) hours as needed for wheezing or shortness of breath. 1 Inhaler 0  . atorvastatin (LIPITOR) 80 MG tablet TAKE 1  TABLET BY MOUTH EVERY DAY 90 tablet 3  . benzonatate (TESSALON) 100 MG capsule Take 1 capsule (100 mg total) by mouth 3 (three) times daily as needed for cough. 30 capsule 0  . carbamazepine (CARBATROL) 300 MG 12 hr capsule TAKE 1 CAPSULE BY MOUTH TWICE A DAY 180 capsule 3  . cloNIDine (CATAPRES) 0.2 MG tablet Take 1 tablet (0.2 mg total) by mouth 2 (two) times daily. 180 tablet 3  . clotrimazole (LOTRIMIN AF) 1 % cream Apply 1 application topically 2 (two) times daily. 30 g 0  . diazepam (VALIUM) 2 MG tablet TAKE 1 TABLET BY MOUTH TWICE A DAY AS NEEDED 60 tablet 2  . escitalopram (LEXAPRO) 10 MG tablet TAKE 1 TABLET BY MOUTH EVERY DAY 90 tablet 3  . fluticasone (FLONASE) 50 MCG/ACT  nasal spray Place 2 sprays into both nostrils daily. 16 g 6  . furosemide (LASIX) 20 MG tablet TAKE 1 TABLET BY MOUTH EVERY DAY 90 tablet 3  . isosorbide mononitrate (IMDUR) 30 MG 24 hr tablet TAKE 1 TABLET BY MOUTH EVERY DAY 90 tablet 1  . levothyroxine (SYNTHROID, LEVOTHROID) 75 MCG tablet TAKE 1 TABLET BY MOUTH EVERY DAY 90 tablet 3  . losartan (COZAAR) 100 MG tablet TAKE 1/2 TABLET BY MOUTH 2 TIMES A DAY 90 tablet 3  . metFORMIN (GLUCOPHAGE) 500 MG tablet TAKE 1 TABLET BY MOUTH TWICE A DAY WITH A MEAL 180 tablet 2  . metoprolol tartrate (LOPRESSOR) 100 MG tablet TAKE 1 TABLET BY MOUTH TWICE A DAY 180 tablet 3  . Multiple Vitamins-Minerals (MULTIVITAMINS THER. W/MINERALS) TABS Take 1 tablet by mouth at bedtime.      . predniSONE (DELTASONE) 20 MG tablet 2 tabs poqday 1-3, 1 tabs poqday 4-6 9 tablet 0  . warfarin (COUMADIN) 7.5 MG tablet TAKE 1 AND 1/2 TABLET BY MOUTH MONDAY, WEDNESDAY, AND FRIDAY AND 1 TAB ON ALL OTHER DAYS 180 tablet 3   No current facility-administered medications on file prior to visit.    No Known Allergies Social History   Socioeconomic History  . Marital status: Married    Spouse name: Not on file  . Number of children: 4  . Years of education: 12th  . Highest education level: Not on file  Occupational History  . Occupation: Retired  Scientific laboratory technician  . Financial resource strain: Not on file  . Food insecurity:    Worry: Not on file    Inability: Not on file  . Transportation needs:    Medical: Not on file    Non-medical: Not on file  Tobacco Use  . Smoking status: Former Smoker    Years: 5.00    Types: Cigarettes    Last attempt to quit: 08/19/1956    Years since quitting: 61.6  . Smokeless tobacco: Former Systems developer    Types: Chew  Substance and Sexual Activity  . Alcohol use: No  . Drug use: No  . Sexual activity: Never  Lifestyle  . Physical activity:    Days per week: Not on file    Minutes per session: Not on file  . Stress: Not on file    Relationships  . Social connections:    Talks on phone: Not on file    Gets together: Not on file    Attends religious service: Not on file    Active member of club or organization: Not on file    Attends meetings of clubs or organizations: Not on file    Relationship status: Not on  file  . Intimate partner violence:    Fear of current or ex partner: Not on file    Emotionally abused: Not on file    Physically abused: Not on file    Forced sexual activity: Not on file  Other Topics Concern  . Not on file  Social History Narrative   Lives with wife.    Review of Systems  All other systems reviewed and are negative.      Objective:   Physical Exam  Constitutional: He appears well-developed and well-nourished.  Cardiovascular: An irregularly irregular rhythm present.  Pulmonary/Chest: Effort normal and breath sounds normal. No respiratory distress. He has no wheezes. He has no rales.  Abdominal: Soft. Bowel sounds are normal. He exhibits no distension. There is no abdominal tenderness. There is no rebound.  Musculoskeletal:        General: No edema.  Vitals reviewed.         Assessment & Plan:  Paroxysmal atrial fibrillation ([place]) - Plan: PT with INR/Fingerstick  INR is therapeutic.  Reemphasized to the patient to check INR every 6 weeks.  Continue Coumadin 7-1/2 mg tablets.  Take 1 tablet every day except Monday and Friday when he takes 1-1/2 tablets which is the equivalent to 11.25 mg.

## 2018-04-13 ENCOUNTER — Other Ambulatory Visit: Payer: Self-pay | Admitting: Family Medicine

## 2018-04-17 ENCOUNTER — Ambulatory Visit: Payer: Medicare Other | Admitting: Family Medicine

## 2018-05-08 ENCOUNTER — Other Ambulatory Visit: Payer: Self-pay | Admitting: Family Medicine

## 2018-05-19 ENCOUNTER — Ambulatory Visit (INDEPENDENT_AMBULATORY_CARE_PROVIDER_SITE_OTHER): Payer: Medicare Other | Admitting: Family Medicine

## 2018-05-19 ENCOUNTER — Encounter: Payer: Self-pay | Admitting: Family Medicine

## 2018-05-19 VITALS — BP 190/80 | HR 77 | Temp 98.1°F | Resp 18 | Ht 65.0 in | Wt 218.0 lb

## 2018-05-19 DIAGNOSIS — J310 Chronic rhinitis: Secondary | ICD-10-CM

## 2018-05-19 DIAGNOSIS — J4521 Mild intermittent asthma with (acute) exacerbation: Secondary | ICD-10-CM

## 2018-05-19 DIAGNOSIS — J329 Chronic sinusitis, unspecified: Secondary | ICD-10-CM | POA: Diagnosis not present

## 2018-05-19 MED ORDER — AMOXICILLIN 875 MG PO TABS: 875.0000 mg | ORAL_TABLET | Freq: Two times a day (BID) | ORAL | 0 refills | Status: DC

## 2018-05-19 NOTE — Progress Notes (Signed)
Subjective:    Patient ID: [name], male    DOB: [birth year], 85 y.o.   MRN: [id number]  HPI Patient states that he has been sick with a cold since December.  At that time he was diagnosed with bronchitis and was given prednisone.  When I asked him to describe the call he states that he has constant pressure in both frontal sinuses.  He reports postnasal drip triggering a tickle-like cough that will not stop.  Sometimes he coughs so hard he feels like he is going to pass out.  He also hears himself wheezing at night.  Today on his exam, the patient has diminished breath sounds with faint expiratory wheezes in all 4 lung fields.  He also has pain and pressure in both maxillary sinuses and significant mucosal edema and rhinorrhea.  His blood pressure today is elevated however the patient just got into a argument with his wife over a conflict in the family.  He is extremely upset and he believes that this is causing his blood pressure to be elevated.  His blood pressure is checked at home regularly and it is usually well controlled.  He has a blood pressure cuff at home with which he monitors his blood pressure. Past Medical History:  Diagnosis Date  . Atrial flutter ([place])   . BPH (benign prostatic hyperplasia)   . Coronary artery disease    a. s/p MI tx with Cypher DES to pRCA in 4/04;  b. Echocardiogram 7/09: Normal LV function.  c. Nuclear study 3/13 no ischemia;  d. ETT 2/14 neg;  e. admx with CP => LHC (01/30/2013):  pLAD 40-50%, oD1 70-80%, oOM1 40%, pRCA stent patent, mid RCA 30%, EF 60-65%. => med Rx.  . DDD (degenerative disc disease)   . Diabetes mellitus without complication ([place])   . Dyslipidemia   . Hemorrhoids   . Hx MRSA infection    left buttocks abscess  . Hx of echocardiogram    a. Echocardiogram (01/31/2013): Mild focal basal and mild concentric hypertrophy of the septum, EF 50-55%, normal wall motion, grade 1 diastolic dysfunction, trivial AI, MAC, mild LAE, PASP 35  .  Hyperlipidemia   . Hypertension   . Hypothyroidism   . Meniere's disease    Status post shunt  . Myocardial infarction ([place]) 2008  . Occlusion and stenosis of carotid artery without mention of cerebral infarction    40-59% on carotid doppler 2014; [place] (01/2013): R 1-39%, L 60-79%  . Rotator cuff injury    chronic rotator cuff injury status post repair  . Seizure disorder ([place])   . Stroke [facility])    a. 01/2013=> post cardiac cath CVA to L post communicating artery system; R sided weakness  . Syncope    Past Surgical History:  Procedure Laterality Date  . COLONOSCOPY    . CORONARY ANGIOPLASTY WITH STENT PLACEMENT  07/14/2002   Stent to the right coronary artery  . ENDOLYMPHATIC SHUNT DECOMPRESSION  06/11/2009    Right endolymphatic sac decompression and shunt  placement  . HERNIA REPAIR  04/10/2008   scrotal hernia repair  . LEFT HEART CATHETERIZATION WITH CORONARY ANGIOGRAM N/A 01/30/2013   Procedure: LEFT HEART CATHETERIZATION WITH CORONARY ANGIOGRAM;  Surgeon: Larey Dresser, MD;  Location[facility] C[facility] LAB;  Service: Cardiovascular;  Laterality: N/A;  . ROTATOR CUFF REPAIR     for chronic rotator cuff injury   Current Outpatient Medications on File Prior to Visit  Medication Sig Dispense Refill  . albuterol (  PROVENTIL HFA;VENTOLIN HFA) 108 (90 Base) MCG/ACT inhaler Inhale 2 puffs into the lungs every 4 (four) hours as needed for wheezing or shortness of breath. 1 Inhaler 0  . atorvastatin (LIPITOR) 80 MG tablet TAKE 1 TABLET BY MOUTH EVERY DAY 90 tablet 3  . benzonatate (TESSALON) 100 MG capsule Take 1 capsule (100 mg total) by mouth 3 (three) times daily as needed for cough. 30 capsule 0  . carbamazepine (CARBATROL) 300 MG 12 hr capsule TAKE 1 CAPSULE BY MOUTH TWICE A DAY 180 capsule 3  . cloNIDine (CATAPRES) 0.2 MG tablet Take 1 tablet (0.2 mg total) by mouth 2 (two) times daily. 180 tablet 3  . clotrimazole (LOTRIMIN AF) 1 % cream Apply 1 application topically 2 (two) times daily.  30 g 0  . diazepam (VALIUM) 2 MG tablet TAKE 1 TABLET BY MOUTH TWICE A DAY AS NEEDED 60 tablet 2  . escitalopram (LEXAPRO) 10 MG tablet TAKE 1 TABLET BY MOUTH EVERY DAY 90 tablet 3  . fluticasone (FLONASE) 50 MCG/ACT nasal spray Place 2 sprays into both nostrils daily. 16 g 6  . furosemide (LASIX) 20 MG tablet TAKE 1 TABLET BY MOUTH EVERY DAY 90 tablet 3  . isosorbide mononitrate (IMDUR) 30 MG 24 hr tablet TAKE 1 TABLET BY MOUTH EVERY DAY 90 tablet 1  . levothyroxine (SYNTHROID, LEVOTHROID) 75 MCG tablet TAKE 1 TABLET BY MOUTH EVERY DAY 90 tablet 3  . losartan (COZAAR) 100 MG tablet TAKE 1/2 TABLET BY MOUTH 2 TIMES A DAY 90 tablet 3  . metFORMIN (GLUCOPHAGE) 500 MG tablet TAKE 1 TABLET BY MOUTH TWICE A DAY WITH A MEAL 180 tablet 2  . metoprolol tartrate (LOPRESSOR) 100 MG tablet TAKE 1 TABLET BY MOUTH TWICE A DAY 180 tablet 3  . Multiple Vitamins-Minerals (MULTIVITAMINS THER. W/MINERALS) TABS Take 1 tablet by mouth at bedtime.      . predniSONE (DELTASONE) 20 MG tablet 2 tabs poqday 1-3, 1 tabs poqday 4-6 9 tablet 0  . warfarin (COUMADIN) 7.5 MG tablet TAKE 1 AND 1/2 TABLET BY MOUTH MONDAY, WEDNESDAY, AND FRIDAY AND 1 TAB ON ALL OTHER DAYS 180 tablet 3   No current facility-administered medications on file prior to visit.    No Known Allergies Social History   Socioeconomic History  . Marital status: Married    Spouse name: Not on file  . Number of children: 4  . Years of education: 12th  . Highest education level: Not on file  Occupational History  . Occupation: Retired  Scientific laboratory technician  . Financial resource strain: Not on file  . Food insecurity:    Worry: Not on file    Inability: Not on file  . Transportation needs:    Medical: Not on file    Non-medical: Not on file  Tobacco Use  . Smoking status: Former Smoker    Years: 5.00    Types: Cigarettes    Last attempt to quit: 08/19/1956    Years since quitting: 61.7  . Smokeless tobacco: Former Systems developer    Types: Chew  Substance  and Sexual Activity  . Alcohol use: No  . Drug use: No  . Sexual activity: Never  Lifestyle  . Physical activity:    Days per week: Not on file    Minutes per session: Not on file  . Stress: Not on file  Relationships  . Social connections:    Talks on phone: Not on file    Gets together: Not on file  Attends religious service: Not on file    Active member of club or organization: Not on file    Attends meetings of clubs or organizations: Not on file    Relationship status: Not on file  . Intimate partner violence:    Fear of current or ex partner: Not on file    Emotionally abused: Not on file    Physically abused: Not on file    Forced sexual activity: Not on file  Other Topics Concern  . Not on file  Social History Narrative   Lives with wife.    Review of Systems  All other systems reviewed and are negative.      Objective:   Physical Exam  Constitutional: He appears well-developed and well-nourished.  HENT:  Right Ear: Tympanic membrane, external ear and ear canal normal.  Left Ear: Tympanic membrane, external ear and ear canal normal.  Nose: Mucosal edema and rhinorrhea present. Right sinus exhibits maxillary sinus tenderness. Left sinus exhibits maxillary sinus tenderness.  Mouth/Throat: Oropharynx is clear and moist.  Cardiovascular: An irregularly irregular rhythm present.  Pulmonary/Chest: Effort normal. No respiratory distress. He has decreased breath sounds. He has wheezes. He has no rales.  Abdominal: Soft. Bowel sounds are normal. He exhibits no distension. There is no abdominal tenderness. There is no rebound.  Musculoskeletal:        General: No edema.  Vitals reviewed.         Assessment & Plan:  I believe the patient has a sinus infection causing postnasal drip.  I believe this is exacerbating underlying reactive airway disease causing wheezing and cough variant asthma.  I have asked the patient to start Symbicort 160/4.5, 2 puffs inhaled  twice daily for the wheezing and cough and reactive airway disease.  I will ask him to resume his Flonase 2 sprays each nostril daily for the postnasal drip and sinusitis.  Meanwhile I will treat the sinus infection with amoxicillin 875 mg p.o. twice daily for 10 days.  Recheck next week so that we can repeat his INR after the antibiotics which will likely affect his INR.  Recheck blood pressure at home daily and recheck next week when he comes in for an INR check.  Blood pressure is artificially elevated today due to a recent argument with his wife that has him upset.

## 2018-05-22 ENCOUNTER — Ambulatory Visit: Payer: Medicare Other | Admitting: Family Medicine

## 2018-05-29 ENCOUNTER — Inpatient Hospital Stay (HOSPITAL_COMMUNITY)
Admission: EM | Admit: 2018-05-29 | Discharge: 2018-06-01 | DRG: 057 | Disposition: A | Discharge status: Rehabilitation Facility | Payer: Medicare Other | Attending: Internal Medicine | Admitting: Internal Medicine

## 2018-05-29 ENCOUNTER — Emergency Department (HOSPITAL_COMMUNITY): Payer: Medicare Other

## 2018-05-29 ENCOUNTER — Encounter (HOSPITAL_COMMUNITY): Payer: Self-pay | Admitting: *Deleted

## 2018-05-29 ENCOUNTER — Other Ambulatory Visit: Payer: Self-pay

## 2018-05-29 DIAGNOSIS — Z9861 Coronary angioplasty status: Secondary | ICD-10-CM

## 2018-05-29 DIAGNOSIS — E119 Type 2 diabetes mellitus without complications: Secondary | ICD-10-CM | POA: Diagnosis present

## 2018-05-29 DIAGNOSIS — Z955 Presence of coronary angioplasty implant and graft: Secondary | ICD-10-CM

## 2018-05-29 DIAGNOSIS — E118 Type 2 diabetes mellitus with unspecified complications: Secondary | ICD-10-CM

## 2018-05-29 DIAGNOSIS — I2510 Atherosclerotic heart disease of native coronary artery without angina pectoris: Secondary | ICD-10-CM | POA: Diagnosis not present

## 2018-05-29 DIAGNOSIS — I6930 Unspecified sequelae of cerebral infarction: Secondary | ICD-10-CM | POA: Diagnosis not present

## 2018-05-29 DIAGNOSIS — I10 Essential (primary) hypertension: Secondary | ICD-10-CM | POA: Diagnosis present

## 2018-05-29 DIAGNOSIS — R4182 Altered mental status, unspecified: Secondary | ICD-10-CM | POA: Diagnosis not present

## 2018-05-29 DIAGNOSIS — Z833 Family history of diabetes mellitus: Secondary | ICD-10-CM

## 2018-05-29 DIAGNOSIS — I480 Paroxysmal atrial fibrillation: Secondary | ICD-10-CM | POA: Diagnosis present

## 2018-05-29 DIAGNOSIS — R55 Syncope and collapse: Secondary | ICD-10-CM | POA: Diagnosis not present

## 2018-05-29 DIAGNOSIS — F0390 Unspecified dementia without behavioral disturbance: Secondary | ICD-10-CM | POA: Diagnosis present

## 2018-05-29 DIAGNOSIS — I4892 Unspecified atrial flutter: Secondary | ICD-10-CM | POA: Diagnosis not present

## 2018-05-29 DIAGNOSIS — Z79899 Other long term (current) drug therapy: Secondary | ICD-10-CM

## 2018-05-29 DIAGNOSIS — G912 (Idiopathic) normal pressure hydrocephalus: Secondary | ICD-10-CM | POA: Diagnosis present

## 2018-05-29 DIAGNOSIS — R918 Other nonspecific abnormal finding of lung field: Secondary | ICD-10-CM | POA: Diagnosis not present

## 2018-05-29 DIAGNOSIS — E039 Hypothyroidism, unspecified: Secondary | ICD-10-CM | POA: Diagnosis present

## 2018-05-29 DIAGNOSIS — Z8249 Family history of ischemic heart disease and other diseases of the circulatory system: Secondary | ICD-10-CM

## 2018-05-29 DIAGNOSIS — R410 Disorientation, unspecified: Secondary | ICD-10-CM | POA: Diagnosis not present

## 2018-05-29 DIAGNOSIS — Z7951 Long term (current) use of inhaled steroids: Secondary | ICD-10-CM

## 2018-05-29 DIAGNOSIS — Z7984 Long term (current) use of oral hypoglycemic drugs: Secondary | ICD-10-CM

## 2018-05-29 DIAGNOSIS — Z87891 Personal history of nicotine dependence: Secondary | ICD-10-CM

## 2018-05-29 DIAGNOSIS — Z8614 Personal history of Methicillin resistant Staphylococcus aureus infection: Secondary | ICD-10-CM

## 2018-05-29 DIAGNOSIS — I252 Old myocardial infarction: Secondary | ICD-10-CM

## 2018-05-29 DIAGNOSIS — G40909 Epilepsy, unspecified, not intractable, without status epilepticus: Secondary | ICD-10-CM

## 2018-05-29 DIAGNOSIS — E785 Hyperlipidemia, unspecified: Secondary | ICD-10-CM | POA: Diagnosis present

## 2018-05-29 DIAGNOSIS — N400 Enlarged prostate without lower urinary tract symptoms: Secondary | ICD-10-CM | POA: Diagnosis present

## 2018-05-29 DIAGNOSIS — R4781 Slurred speech: Secondary | ICD-10-CM | POA: Diagnosis present

## 2018-05-29 DIAGNOSIS — Z7901 Long term (current) use of anticoagulants: Secondary | ICD-10-CM

## 2018-05-29 DIAGNOSIS — Z79890 Hormone replacement therapy (postmenopausal): Secondary | ICD-10-CM

## 2018-05-29 NOTE — ED Notes (Signed)
Patient transported to X-ray, CT 

## 2018-05-29 NOTE — ED Notes (Signed)
[place] EDP made aware re pt's neuro sxs.

## 2018-05-29 NOTE — ED Triage Notes (Signed)
Per EMS, pt from home, pt's family reports AMS, became confused for a short time.  Pt is A&O x 4 with EMS.  Family reports pt have been on abx for bronchitis and have had gait problems that is worsening for the past 2 weeks.

## 2018-05-30 ENCOUNTER — Observation Stay (HOSPITAL_COMMUNITY): Payer: Medicare Other

## 2018-05-30 ENCOUNTER — Observation Stay (HOSPITAL_BASED_OUTPATIENT_CLINIC_OR_DEPARTMENT_OTHER): Payer: Medicare Other

## 2018-05-30 ENCOUNTER — Encounter (HOSPITAL_COMMUNITY): Payer: Self-pay | Admitting: Emergency Medicine

## 2018-05-30 DIAGNOSIS — I6621 Occlusion and stenosis of right posterior cerebral artery: Secondary | ICD-10-CM | POA: Diagnosis not present

## 2018-05-30 DIAGNOSIS — I480 Paroxysmal atrial fibrillation: Secondary | ICD-10-CM | POA: Diagnosis not present

## 2018-05-30 DIAGNOSIS — R4182 Altered mental status, unspecified: Secondary | ICD-10-CM | POA: Diagnosis present

## 2018-05-30 DIAGNOSIS — G459 Transient cerebral ischemic attack, unspecified: Secondary | ICD-10-CM

## 2018-05-30 DIAGNOSIS — G40909 Epilepsy, unspecified, not intractable, without status epilepticus: Secondary | ICD-10-CM

## 2018-05-30 DIAGNOSIS — R410 Disorientation, unspecified: Secondary | ICD-10-CM | POA: Diagnosis not present

## 2018-05-30 LAB — MRSA PCR SCREENING: MRSA by PCR: NEGATIVE

## 2018-05-30 LAB — CBC WITH DIFFERENTIAL/PLATELET
Abs Immature Granulocytes: 0.09 10*3/uL — ABNORMAL HIGH (ref 0.00–0.07)
Abs Immature Granulocytes: 0.12 10*3/uL — ABNORMAL HIGH (ref 0.00–0.07)
Basophils Absolute: 0.1 10*3/uL (ref 0.0–0.1)
Basophils Absolute: 0.1 10*3/uL (ref 0.0–0.1)
Basophils Relative: 1 %
Basophils Relative: 1 %
Eosinophils Absolute: 0.3 10*3/uL (ref 0.0–0.5)
Eosinophils Absolute: 0.5 10*3/uL (ref 0.0–0.5)
Eosinophils Relative: 3 %
Eosinophils Relative: 4 %
HCT: 38.9 % — ABNORMAL LOW (ref 39.0–52.0)
HCT: 39.2 % (ref 39.0–52.0)
Hemoglobin: 12.2 g/dL — ABNORMAL LOW (ref 13.0–17.0)
Hemoglobin: 13 g/dL (ref 13.0–17.0)
Immature Granulocytes: 1 %
Immature Granulocytes: 1 %
Lymphocytes Relative: 17 %
Lymphocytes Relative: 18 %
Lymphs Abs: 2 10*3/uL (ref 0.7–4.0)
Lymphs Abs: 2.3 10*3/uL (ref 0.7–4.0)
MCH: 29.4 pg (ref 26.0–34.0)
MCH: 31 pg (ref 26.0–34.0)
MCHC: 31.4 g/dL (ref 30.0–36.0)
MCHC: 33.2 g/dL (ref 30.0–36.0)
MCV: 93.7 fL (ref 80.0–100.0)
MCV: 93.3 fL (ref 80.0–100.0)
Monocytes Absolute: 0.9 10*3/uL (ref 0.1–1.0)
Monocytes Absolute: 1 10*3/uL (ref 0.1–1.0)
Monocytes Relative: 7 %
Monocytes Relative: 8 %
Neutro Abs: 8.6 10*3/uL — ABNORMAL HIGH (ref 1.7–7.7)
Neutro Abs: 8.5 10*3/uL — ABNORMAL HIGH (ref 1.7–7.7)
Neutrophils Relative %: 71 %
Neutrophils Relative %: 68 %
Platelets: 278 10*3/uL (ref 150–400)
Platelets: 272 10*3/uL (ref 150–400)
RBC: 4.15 MIL/uL — ABNORMAL LOW (ref 4.22–5.81)
RBC: 4.2 MIL/uL — ABNORMAL LOW (ref 4.22–5.81)
RDW: 13.9 % (ref 11.5–15.5)
RDW: 14.1 % (ref 11.5–15.5)
WBC: 12.1 10*3/uL — ABNORMAL HIGH (ref 4.0–10.5)
WBC: 12.5 10*3/uL — ABNORMAL HIGH (ref 4.0–10.5)
nRBC: 0 % (ref 0.0–0.2)
nRBC: 0 % (ref 0.0–0.2)

## 2018-05-30 LAB — POCT I-STAT 7, (LYTES, BLD GAS, ICA,H+H)
Acid-Base Excess: 1 mmol/L (ref 0.0–2.0)
Bicarbonate: 25.3 mmol/L (ref 20.0–28.0)
Calcium, Ion: 1.2 mmol/L (ref 1.15–1.40)
HCT: 35 % — ABNORMAL LOW (ref 39.0–52.0)
Hemoglobin: 11.9 g/dL — ABNORMAL LOW (ref 13.0–17.0)
O2 Saturation: 96 %
Patient temperature: 97.9
Potassium: 4.7 mmol/L (ref 3.5–5.1)
Sodium: 132 mmol/L — ABNORMAL LOW (ref 135–145)
TCO2: 26 mmol/L (ref 22–32)
pCO2 arterial: 38.6 mmHg (ref 32.0–48.0)
pH, Arterial: 7.422 (ref 7.350–7.450)
pO2, Arterial: 77 mmHg — ABNORMAL LOW (ref 83.0–108.0)

## 2018-05-30 LAB — BASIC METABOLIC PANEL
Anion gap: 9 (ref 5–15)
BUN: 19 mg/dL (ref 8–23)
CO2: 23 mmol/L (ref 22–32)
Calcium: 8.9 mg/dL (ref 8.9–10.3)
Chloride: 99 mmol/L (ref 98–111)
Creatinine, Ser: 1.17 mg/dL (ref 0.61–1.24)
GFR calc Af Amer: >60 mL/min (ref >60)
GFR calc non Af Amer: 58 mL/min — ABNORMAL LOW (ref >60)
Glucose, Bld: 122 mg/dL — ABNORMAL HIGH (ref 70–99)
Potassium: 4.9 mmol/L (ref 3.5–5.1)
Sodium: 131 mmol/L — ABNORMAL LOW (ref 135–145)

## 2018-05-30 LAB — COMPREHENSIVE METABOLIC PANEL
ALT: 15 U/L (ref 0–44)
AST: 19 U/L (ref 15–41)
Albumin: 3.7 g/dL (ref 3.5–5.0)
Alkaline Phosphatase: 71 U/L (ref 38–126)
Anion gap: 12 (ref 5–15)
BUN: 21 mg/dL (ref 8–23)
CO2: 23 mmol/L (ref 22–32)
Calcium: 9 mg/dL (ref 8.9–10.3)
Chloride: 98 mmol/L (ref 98–111)
Creatinine, Ser: 1.13 mg/dL (ref 0.61–1.24)
GFR calc Af Amer: >60 mL/min (ref >60)
GFR calc non Af Amer: >60 mL/min (ref >60)
Glucose, Bld: 107 mg/dL — ABNORMAL HIGH (ref 70–99)
Potassium: 4.8 mmol/L (ref 3.5–5.1)
Sodium: 133 mmol/L — ABNORMAL LOW (ref 135–145)
Total Bilirubin: 0.9 mg/dL (ref 0.3–1.2)
Total Protein: 6.1 g/dL — ABNORMAL LOW (ref 6.5–8.1)

## 2018-05-30 LAB — URINALYSIS, ROUTINE W REFLEX MICROSCOPIC
Bilirubin Urine: NEGATIVE
Glucose, UA: NEGATIVE mg/dL
Hgb urine dipstick: NEGATIVE
Ketones, ur: NEGATIVE mg/dL
Leukocytes,Ua: NEGATIVE
Nitrite: NEGATIVE
Protein, ur: 30 mg/dL — AB
Specific Gravity, Urine: 1.024 (ref 1.005–1.030)
pH: 6 (ref 5.0–8.0)

## 2018-05-30 LAB — ECHOCARDIOGRAM COMPLETE

## 2018-05-30 LAB — I-STAT TROPONIN, ED: Troponin i, poc: 0.06 ng/mL (ref 0.00–0.08)

## 2018-05-30 LAB — LIPID PANEL
Cholesterol: 179 mg/dL (ref 0–200)
HDL: 50 mg/dL (ref >40)
LDL Cholesterol: 103 mg/dL — ABNORMAL HIGH (ref 0–99)
Total CHOL/HDL Ratio: 3.6 RATIO
Triglycerides: 131 mg/dL (ref <150)
VLDL: 26 mg/dL (ref 0–40)

## 2018-05-30 LAB — PROTIME-INR
INR: 1.6 — ABNORMAL HIGH (ref 0.8–1.2)
INR: 1.6 — ABNORMAL HIGH (ref 0.8–1.2)
Prothrombin Time: 18.8 seconds — ABNORMAL HIGH (ref 11.4–15.2)
Prothrombin Time: 18.8 seconds — ABNORMAL HIGH (ref 11.4–15.2)

## 2018-05-30 LAB — CBG MONITORING, ED: Glucose-Capillary: 97 mg/dL (ref 70–99)

## 2018-05-30 LAB — AMMONIA: Ammonia: 25 umol/L (ref 9–35)

## 2018-05-30 LAB — CARBAMAZEPINE LEVEL, TOTAL: Carbamazepine Lvl: 10 ug/mL (ref 4.0–12.0)

## 2018-05-30 LAB — GLUCOSE, CAPILLARY: Glucose-Capillary: 114 mg/dL — ABNORMAL HIGH (ref 70–99)

## 2018-05-30 LAB — HEMOGLOBIN A1C
Hgb A1c MFr Bld: 6.6 % — ABNORMAL HIGH (ref 4.8–5.6)
Mean Plasma Glucose: 142.72 mg/dL

## 2018-05-30 MED ORDER — STROKE: EARLY STAGES OF RECOVERY BOOK: Freq: Once | Status: DC

## 2018-05-30 MED ORDER — ATORVASTATIN CALCIUM 80 MG PO TABS
80.0000 mg | ORAL_TABLET | Freq: Every day | ORAL | Status: DC
  Administered 2018-05-30 – 2018-05-31 (×2): 80 mg via ORAL
  Filled 2018-05-30 (×2): qty 1

## 2018-05-30 MED ORDER — WARFARIN - PHARMACIST DOSING INPATIENT
Freq: Every day | Status: DC
  Administered 2018-05-30: 19:00:00

## 2018-05-30 MED ORDER — ACETAMINOPHEN 650 MG RE SUPP: 650.0000 mg | Freq: Four times a day (QID) | RECTAL | Status: DC | PRN

## 2018-05-30 MED ORDER — ESCITALOPRAM OXALATE 10 MG PO TABS
10.0000 mg | ORAL_TABLET | Freq: Every day | ORAL | Status: DC
  Administered 2018-05-30 – 2018-05-31 (×2): 10 mg via ORAL
  Filled 2018-05-30 (×2): qty 1

## 2018-05-30 MED ORDER — ACETAMINOPHEN 325 MG PO TABS: 650.0000 mg | ORAL_TABLET | Freq: Four times a day (QID) | ORAL | Status: DC | PRN

## 2018-05-30 MED ORDER — DIAZEPAM 2 MG PO TABS: 2.0000 mg | ORAL_TABLET | Freq: Two times a day (BID) | ORAL | Status: DC | PRN

## 2018-05-30 MED ORDER — LEVOTHYROXINE SODIUM 75 MCG PO TABS
75.0000 ug | ORAL_TABLET | Freq: Every day | ORAL | Status: DC
  Administered 2018-05-31 – 2018-06-01 (×2): 75 ug via ORAL
  Filled 2018-05-30 (×2): qty 1

## 2018-05-30 MED ORDER — PERFLUTREN LIPID MICROSPHERE
1.0000 mL | INTRAVENOUS | Status: AC | PRN
  Administered 2018-05-30: 2 mL via INTRAVENOUS
  Filled 2018-05-30: qty 10

## 2018-05-30 MED ORDER — ISOSORBIDE MONONITRATE ER 30 MG PO TB24
30.0000 mg | ORAL_TABLET | Freq: Every day | ORAL | Status: DC
  Administered 2018-05-30 – 2018-06-01 (×3): 30 mg via ORAL
  Filled 2018-05-30 (×4): qty 1

## 2018-05-30 MED ORDER — LEVOTHYROXINE SODIUM 75 MCG PO TABS
75.0000 ug | ORAL_TABLET | Freq: Every day | ORAL | Status: DC
  Administered 2018-05-30: 75 ug via ORAL
  Filled 2018-05-30: qty 1

## 2018-05-30 MED ORDER — METFORMIN HCL 500 MG PO TABS
500.0000 mg | ORAL_TABLET | Freq: Two times a day (BID) | ORAL | Status: DC
  Administered 2018-05-30 – 2018-05-31 (×3): 500 mg via ORAL
  Filled 2018-05-30 (×3): qty 1

## 2018-05-30 MED ORDER — ONDANSETRON HCL 4 MG PO TABS: 4.0000 mg | ORAL_TABLET | Freq: Four times a day (QID) | ORAL | Status: DC | PRN

## 2018-05-30 MED ORDER — WARFARIN SODIUM 7.5 MG PO TABS
15.0000 mg | ORAL_TABLET | Freq: Once | ORAL | Status: AC
  Administered 2018-05-30: 15 mg via ORAL
  Filled 2018-05-30 (×2): qty 2

## 2018-05-30 MED ORDER — CARBAMAZEPINE ER 100 MG PO TB12
300.0000 mg | ORAL_TABLET | Freq: Two times a day (BID) | ORAL | Status: DC
  Administered 2018-05-30 – 2018-06-01 (×5): 300 mg via ORAL
  Filled 2018-05-30 (×5): qty 3

## 2018-05-30 MED ORDER — ENOXAPARIN SODIUM 40 MG/0.4ML ~~LOC~~ SOLN: 40.0000 mg | SUBCUTANEOUS | Status: DC

## 2018-05-30 MED ORDER — HYDRALAZINE HCL 20 MG/ML IJ SOLN
5.0000 mg | INTRAMUSCULAR | Status: DC | PRN
  Administered 2018-05-30: 5 mg via INTRAVENOUS
  Filled 2018-05-30: qty 1

## 2018-05-30 MED ORDER — ONDANSETRON HCL 4 MG/2ML IJ SOLN: 4.0000 mg | Freq: Four times a day (QID) | INTRAMUSCULAR | Status: DC | PRN

## 2018-05-30 NOTE — Progress Notes (Signed)
ANTICOAGULATION CONSULT NOTE - Initial Consult  Pharmacy Consult for Coumadin Indication: atrial fibrillation  No Known Allergies  Vital Signs: Temp: 97.9 F (36.6 C) (03/10 0140) Temp Source: Oral (03/10 0140) BP: 165/94 (03/10 0300) Pulse Rate: 65 (03/10 0300)  Labs: Recent Labs    05/30/18 0110 05/30/18 0145  HGB 12.2* 11.9*  HCT 38.9* 35.0*  PLT 278  --   LABPROT 18.8*  --   INR 1.6*  --   CREATININE 1.17  --     Estimated Creatinine Clearance: 52.7 mL/min (by C-G formula based on SCr of 1.17 mg/dL).   Medical History: Past Medical History:  Diagnosis Date  . Atrial flutter ([employer])   . BPH (benign prostatic hyperplasia)   . Coronary artery disease    a. s/p MI tx with Cypher DES to pRCA in 4/04;  b. Echocardiogram 7/09: Normal LV function.  c. Nuclear study 3/13 no ischemia;  d. ETT 2/14 neg;  e. admx with CP => LHC (01/30/2013):  pLAD 40-50%, oD1 70-80%, oOM1 40%, pRCA stent patent, mid RCA 30%, EF 60-65%. => med Rx.  . DDD (degenerative disc disease)   . Diabetes mellitus without complication ([place])   . Dyslipidemia   . Hemorrhoids   . Hx MRSA infection    left buttocks abscess  . Hx of echocardiogram    a. Echocardiogram (01/31/2013): Mild focal basal and mild concentric hypertrophy of the septum, EF 50-55%, normal wall motion, grade 1 diastolic dysfunction, trivial AI, MAC, mild LAE, PASP 35  . Hyperlipidemia   . Hypertension   . Hypothyroidism   . Meniere's disease    Status post shunt  . Myocardial infarction ([place]) 2008  . Occlusion and stenosis of carotid artery without mention of cerebral infarction    40-59% on carotid doppler 2014; [place] (01/2013): R 1-39%, L 60-79%  . Rotator cuff injury    chronic rotator cuff injury status post repair  . Seizure disorder ([place])   . Stroke [facility])    a. 01/2013=> post cardiac cath CVA to L post communicating artery system; R sided weakness  . Syncope     Assessment: 85yo male presents w/ AMS, to continue Coumadin  for Afib; current INR below goal w/ last dose taken 3/8.  Goal of Therapy:  INR 2-3   Plan:  Will give boosted Coumadin dose of 74m today and monitor INR for dose adjustments.  VWynona Neat PharmD, BCPS  05/30/2018,3:10 AM

## 2018-05-30 NOTE — Progress Notes (Signed)
Pt admitted to floor at 1730 picked up order set of Q4hr VS and q4 hr  mNIH per order set

## 2018-05-30 NOTE — ED Notes (Signed)
Lunch tray ordered 

## 2018-05-30 NOTE — H&P (Signed)
History and Physical    [name]:[id number] DOB: September 25, 1933 DOA: 05/29/2018  PCP: [name], MD   Patient coming from: Home.  I have personally briefly reviewed patient's old medical records in [place]  Chief Complaint: AMS.  HPI: [name] is a 85 y.o. male with medical history significant of atrial flutter, paroxysmal A. fib, BPH, CAD, history of MI, degenerative disc disease, type 2 diabetes, dyslipidemia, hemorrhoids, history of left buttock MRSA abscess, hyperlipidemia, hypertension, hypothyroidism, Mnire's disease, rotator cuff injury, history of stroke, history of seizure disorder, history of syncopal episode who is coming to the emergency department after becoming confused and having slurred speech around 1830 yesterday evening.  Per patient's daughter, he was having some transient jerking movement and the patient's wife stated that his mouth was drooling.  There was no fecal or urinary incontinence.  There was no tongue biting.  Per patient's family, it is difficult to know whether he had more of a facial droop than his residual deficit from previous stroke.  He denies fever, chills, dyspnea, chest pain, palpitations, PND, orthopnea or pitting edema lower extremities.  No abdominal pain, diarrhea, constipation, melena or hematochezia.  No dysuria, frequency or hematuria.  Denies polyuria, polydipsia, polyphagia or blurred vision.  ED Course: Initial vital signs temperature 97.9 F, pulse 63, respirations 19, blood pressure 157/75 mmHg and O2 sat 98% on room air.  Dr. Randal Buba consultant neuro hospitalist service.  He is urinalysis was a hazy appearance, 30 mg/dL proteinuria and rare bacteria on microscopic examination.  Other values were normal.  I-STAT troponin was normal.  His white count was 12.1, hemoglobin 12.2 g/dL and platelets 278.  PT was 18.8 seconds and INR 1.6.  I-STAT 7 showed a PO2 of 77.0 mmHg, sodium 132 mmol/L and a hemoglobin of 11.9 g/dL,  but all other values, including pH, were normal.  BMP shows sodium 131 and glucose of 122.  Other values are normal.  Ammonia was 25 and carbamazepine was 10.0 mcg/mL.  Imaging: Chest radiograph did not show any acute cardiopulmonary disease.  CT head did not show any acute intracranial hemorrhage or ischemic infarct.  However small focus of calcified plaque in right MCA branches new since 2017.  He also shows chronic small vessel ischemia in the left thalamus.  Please see images and full radiology report for further detail.  Review of Systems: As per HPI otherwise 10 point review of systems negative.   Past Medical History:  Diagnosis Date  . Atrial flutter ([place])   . BPH (benign prostatic hyperplasia)   . Coronary artery disease    a. s/p MI tx with Cypher DES to pRCA in 4/04;  b. Echocardiogram 7/09: Normal LV function.  c. Nuclear study 3/13 no ischemia;  d. ETT 2/14 neg;  e. admx with CP => [facility] (01/30/2013):  pLAD 40-50%, oD1 70-80%, oOM1 40%, pRCA stent patent, mid RCA 30%, EF 60-65%. => med Rx.  . DDD (degenerative disc disease)   . Diabetes mellitus without complication ([place])   . Dyslipidemia   . Hemorrhoids   . Hx MRSA infection    left buttocks abscess  . Hx of echocardiogram    a. Echocardiogram (01/31/2013): Mild focal basal and mild concentric hypertrophy of the septum, EF 50-55%, normal wall motion, grade 1 diastolic dysfunction, trivial AI, MAC, mild LAE, PASP 35  . Hyperlipidemia   . Hypertension   . Hypothyroidism   . Meniere's disease    Status post shunt  .  Myocardial infarction ([place]) 2008  . Occlusion and stenosis of carotid artery without mention of cerebral infarction    40-59% on carotid doppler 2014; [place] (01/2013): R 1-39%, L 60-79%  . Rotator cuff injury    chronic rotator cuff injury status post repair  . Seizure disorder ([place])   . Stroke [facility])    a. 01/2013=> post cardiac cath CVA to L post communicating artery system; R sided weakness  . Syncope     Past  Surgical History:  Procedure Laterality Date  . COLONOSCOPY    . CORONARY ANGIOPLASTY WITH STENT PLACEMENT  07/14/2002   Stent to the right coronary artery  . ENDOLYMPHATIC SHUNT DECOMPRESSION  06/11/2009    Right endolymphatic sac decompression and shunt  placement  . HERNIA REPAIR  04/10/2008   scrotal hernia repair  . LEFT HEART CATHETERIZATION WITH CORONARY ANGIOGRAM N/A 01/30/2013   Procedure: LEFT HEART CATHETERIZATION WITH CORONARY ANGIOGRAM;  Surgeon: Larey Dresser, MD;  Location[facility] CATH LAB;  Service: Cardiovascular;  Laterality: N/A;  . ROTATOR CUFF REPAIR     for chronic rotator cuff injury     reports that he quit smoking about 61 years ago. His smoking use included cigarettes. He quit after 5.00 years of use. He has quit using smokeless tobacco.  His smokeless tobacco use included chew. He reports that he does not drink alcohol or use drugs.  No Known Allergies  Family History  Problem Relation Age of Onset  . Heart attack Father 4  . Aneurysm Mother        brain aneurysm  . Coronary artery disease Brother        with CABG  . Diabetes Brother   . Colon cancer Neg Hx   . Colon polyps Neg Hx    Prior to Admission medications   Medication Sig Start Date End Date Taking? Authorizing Provider  albuterol (PROVENTIL HFA;VENTOLIN HFA) 108 (90 Base) MCG/ACT inhaler Inhale 2 puffs into the lungs every 4 (four) hours as needed for wheezing or shortness of breath. 03/17/18  Yes Delsa Grana, PA-C  amoxicillin (AMOXIL) 875 MG tablet Take 875 mg by mouth 2 (two) times daily.   Yes [provider]  atorvastatin (LIPITOR) 80 MG tablet TAKE 1 TABLET BY MOUTH EVERY DAY Patient taking differently: Take 80 mg by mouth daily at 6 PM.  07/25/17  Yes Pickard, Cammie Mcgee, MD  carbamazepine (CARBATROL) 300 MG 12 hr capsule TAKE 1 CAPSULE BY MOUTH TWICE A DAY Patient taking differently: Take 300 mg by mouth 2 (two) times daily.  10/24/17  Yes [name], MD  cloNIDine  (CATAPRES) 0.2 MG tablet Take 1 tablet (0.2 mg total) by mouth 2 (two) times daily. 11/10/17  Yes [name], MD  diazepam (VALIUM) 2 MG tablet TAKE 1 TABLET BY MOUTH TWICE A DAY AS NEEDED Patient taking differently: Take 2 mg by mouth 2 (two) times daily as needed for anxiety.  02/10/18  Yes [name], MD  escitalopram (LEXAPRO) 10 MG tablet TAKE 1 TABLET BY MOUTH EVERY DAY 12/21/17  Yes [name], MD  furosemide (LASIX) 20 MG tablet TAKE 1 TABLET BY MOUTH EVERY DAY Patient taking differently: Take 20 mg by mouth daily.  11/22/17  Yes [name], MD  isosorbide mononitrate (IMDUR) 30 MG 24 hr tablet TAKE 1 TABLET BY MOUTH EVERY DAY Patient taking differently: Take 30 mg by mouth daily.  02/15/18  Yes [name], MD  levothyroxine ([place], LEVOTHROID) 75  MCG tablet TAKE 1 TABLET BY MOUTH EVERY DAY Patient taking differently: Take 75 mcg by mouth daily.  04/13/18  Yes [name], MD  losartan (COZAAR) 100 MG tablet TAKE 1/2 TABLET BY MOUTH 2 TIMES A DAY Patient taking differently: Take 50 mg by mouth 2 (two) times daily.  10/18/17  Yes [name], MD  metFORMIN (GLUCOPHAGE) 500 MG tablet TAKE 1 TABLET BY MOUTH TWICE A DAY WITH A MEAL Patient taking differently: Take 500 mg by mouth 2 (two) times daily with a meal.  05/08/18  Yes Pickard, Cammie Mcgee, MD  metoprolol tartrate (LOPRESSOR) 100 MG tablet TAKE 1 TABLET BY MOUTH TWICE A DAY Patient taking differently: Take 100 mg by mouth 2 (two) times daily.  11/18/17  Yes Antler, Modena Nunnery, MD  Multiple Vitamins-Minerals (MULTIVITAMINS THER. W/MINERALS) TABS Take 1 tablet by mouth at bedtime.     Yes [provider]  warfarin (COUMADIN) 7.5 MG tablet TAKE 1 AND 1/2 TABLET BY MOUTH MONDAY, WEDNESDAY, AND FRIDAY AND 1 TAB ON ALL OTHER DAYS Patient taking differently: Take 7.5-11.25 mg by mouth See admin instructions. TAKE 1 AND 1/2 TABLET BY MOUTH MONDAY, WEDNESDAY, AND FRIDAY AND 1 TAB ON ALL OTHER DAYS.  01/16/18  Yes [name], MD  benzonatate (TESSALON) 100 MG capsule Take 1 capsule (100 mg total) by mouth 3 (three) times daily as needed for cough. Patient not taking: Reported on 05/30/2018 03/17/18   Delsa Grana, PA-C  clotrimazole (LOTRIMIN AF) 1 % cream Apply 1 application topically 2 (two) times daily. Patient not taking: Reported on 05/30/2018 07/18/17   [name], MD  fluticasone [facility]) 50 MCG/ACT nasal spray Place 2 sprays into both nostrils daily. Patient not taking: Reported on 05/30/2018 09/08/16   [name], MD    Physical Exam: Vitals:   05/30/18 0200 05/30/18 0230 05/30/18 0300 05/30/18 0330  BP: (!) 179/69 (!) 172/92 (!) 165/94 (!) 171/76  Pulse:   65 61  Resp: (!) 21 17 18  (!) 29  Temp:      TempSrc:      SpO2:   95% 96%    Constitutional: NAD, calm, comfortable Eyes: PERRL, lids and conjunctivae normal ENMT: Mucous membranes are moist. Posterior pharynx clear of any exudate or lesions. Neck: normal, supple, no masses, no thyromegaly Respiratory: clear to auscultation bilaterally, no wheezing, no crackles. Normal respiratory effort. No accessory muscle use.  Cardiovascular: Regular rate and rhythm, no murmurs / rubs / gallops. No extremity edema. 2+ pedal pulses. No carotid bruits.  Abdomen: Obese, soft,no tenderness, no masses palpated. No hepatosplenomegaly. Bowel sounds positive.  Musculoskeletal: no clubbing / cyanosis. No joint deformity upper and lower extremities. Good ROM, no contractures. Normal muscle tone.  Skin: no rashes, lesions, ulcers. No induration Neurologic: Mild right facial droop, which relatives were not sure if he it is worse or not.  CN 2-12 grossly intact. Sensation intact, DTR normal. Strength 4.5/5 right-sided hemiparesis, which seems to be at baseline.  However, he also shows some generalized weakness. Psychiatric: Normal judgment and insight. Alert and oriented x 3. Normal mood.   Labs on Admission: I have personally  reviewed following labs and imaging studies  CBC: Recent Labs  Lab 05/30/18 0110 05/30/18 0145 05/30/18 0326  WBC 12.1*  --  12.5*  NEUTROABS 8.6*  --  8.5*  HGB 12.2* 11.9* 13.0  HCT 38.9* 35.0* 39.2  MCV 93.7  --  93.3  PLT 278  --  272  Basic Metabolic Panel: Recent Labs  Lab 05/30/18 0110 05/30/18 0145 05/30/18 0326  NA 131* 132* 133*  K 4.9 4.7 4.8  CL 99  --  98  CO2 23  --  23  GLUCOSE 122*  --  107*  BUN 19  --  21  CREATININE 1.17  --  1.13  CALCIUM 8.9  --  9.0   GFR: Estimated Creatinine Clearance: 54.5 mL/min (by C-G formula based on SCr of 1.13 mg/dL). Liver Function Tests: Recent Labs  Lab 05/30/18 0326  AST 19  ALT 15  ALKPHOS 71  BILITOT 0.9  PROT 6.1*  ALBUMIN 3.7   No results for input(s): LIPASE, AMYLASE in the last 168 hours. Recent Labs  Lab 05/30/18 0111  AMMONIA 25   Coagulation Profile: Recent Labs  Lab 05/30/18 0110 05/30/18 0326  INR 1.6* 1.6*   Cardiac Enzymes: No results for input(s): CKTOTAL, CKMB, CKMBINDEX, TROPONINI in the last 168 hours. BNP (last 3 results) No results for input(s): PROBNP in the last 8760 hours. HbA1C: No results for input(s): HGBA1C in the last 72 hours. CBG: No results for input(s): GLUCAP in the last 168 hours. Lipid Profile: No results for input(s): CHOL, HDL, LDLCALC, TRIG, CHOLHDL, LDLDIRECT in the last 72 hours. Thyroid Function Tests: No results for input(s): TSH, T4TOTAL, FREET4, T3FREE, THYROIDAB in the last 72 hours. Anemia Panel: No results for input(s): VITAMINB12, FOLATE, FERRITIN, TIBC, IRON, RETICCTPCT in the last 72 hours. Urine analysis:    Component Value Date/Time   COLORURINE YELLOW 05/29/2018 2331   APPEARANCEUR HAZY (A) 05/29/2018 2331   LABSPEC 1.024 05/29/2018 2331   PHURINE 6.0 05/29/2018 2331   GLUCOSEU NEGATIVE 05/29/2018 2331   HGBUR NEGATIVE 05/29/2018 2331   BILIRUBINUR NEGATIVE 05/29/2018 2331   KETONESUR NEGATIVE 05/29/2018 2331   PROTEINUR 30 (A)  05/29/2018 2331   UROBILINOGEN 0.2 10/22/2013 1451   NITRITE NEGATIVE 05/29/2018 2331   LEUKOCYTESUR NEGATIVE 05/29/2018 2331    Radiological Exams on Admission: Dg Chest 2 View  Result Date: 05/29/2018 CLINICAL DATA:  Altered mental status EXAM: CHEST - 2 VIEW COMPARISON:  09/06/2016 FINDINGS: Shallow inspiration. Heart size and pulmonary vascularity are normal. Lungs appear clear and expanded. No blunting of costophrenic angles. No pneumothorax. Mediastinal contours appear intact. Calcified and tortuous aorta. IMPRESSION: No active cardiopulmonary disease. Electronically Signed   By: Lucienne Capers M.D.   On: 05/29/2018 23:37   Ct Head Wo Contrast  Result Date: 05/30/2018 CLINICAL DATA:  85 year old male with confusion, altered mental status. EXAM: CT HEAD WITHOUT CONTRAST TECHNIQUE: Contiguous axial images were obtained from the base of the skull through the vertex without intravenous contrast. COMPARISON:  12/20/2015 and earlier. FINDINGS: Brain: Cerebral volume is not significantly changed. Stable ventricle size and configuration. Chronic left thalamic lacune is stable. No midline shift, mass effect, evidence of mass lesion, intracranial hemorrhage or evidence of cortically based acute infarction. No cortical encephalomalacia identified. Vascular: Calcified atherosclerosis at the skull base. New calcified plaque in a right MCA branch on series 3, image 17, but no other suspicious intracranial vascular hyperdensity. Skull: No acute osseous abnormality identified. Sinuses/Orbits: Stable and pneumatized right mastoidectomy. Paranasal sinuses and left mastoids are stable and well pneumatized. Other: No acute orbit or scalp soft tissue finding. IMPRESSION: 1. No intracranial hemorrhage or acute cortically based infarct identified. But a small focus of calcified plaque in a Right MCA branch is new since 2017. If the patient has left side weakness or other right MCA ischemic symptoms then  consider acute  thromboembolic disease. 2. Chronic small vessel ischemia in the left thalamus. Electronically Signed   By: Genevie Ann M.D.   On: 05/30/2018 00:04   11/11/2015 echocardiogram complete ------------------------------------------------------------------- LV EF: 50% -   55%  ------------------------------------------------------------------- Indications:      Atrial Flutter (I48.3).  ------------------------------------------------------------------- History:   PMH:  hyperlipidemia. CVA.  Coronary artery disease. Risk factors:  Hypertension. Diabetes mellitus. Dyslipidemia.  ------------------------------------------------------------------- Study Conclusions  - Left ventricle: Global LV strain -16% The cavity size was normal.   Systolic function was normal. The estimated ejection fraction was   in the range of 50% to 55%. Wall motion was normal; there were no   regional wall motion abnormalities. There was an increased   relative contribution of atrial contraction to ventricular   filling. Doppler parameters are consistent with abnormal left   ventricular relaxation (grade 1 diastolic dysfunction). - Aortic valve: Moderately calcified annulus. Trileaflet; normal   thickness, mildly calcified leaflets. - Mitral valve: Calcified annulus. - Left atrium: The atrium was mildly dilated. - Pulmonary arteries: PA peak pressure: 32 mm Hg (S).  EKG: Independently reviewed.  Vent. rate 67 BPM PR interval * ms QRS duration 142 ms QT/QTc 440/465 ms P-R-T axes 0 -38 49 Sinus rhythm LBBB  Assessment/Plan Principal Problem:   AMS (altered mental status) Observation/telemetry. Symptoms history point to TIA versus seizure. Will check swallow screen. PT/OT/SLP. Check carotid Doppler and echocardiogram. Check fasting lipids and hemoglobin A1c Continue warfarin per pharmacy. Consider EEG. Neuro hospitalist service will consult.  Active Problems:   Seizure disorder (HCC) Continue  carbamazepine 300 mg p.o. twice daily. Check carbamazepine level. Consider EEG.    CAD S/P percutaneous coronary angioplasty On atorvastatin, metoprolol and warfarin.    Dyslipidemia Continue atorvastatin 80 mg p.o. daily. Monitor LFTs as needed. Check fasting lipids.    Hypertension Hold antihypertensives. Monitor blood pressure. Allow permissive hypertension.    Paroxysmal atrial fibrillation (HCC) CHA?DS?-VASc Score of 8. Metoprolol on hold to allow permissive hypertension. Continue warfarin per pharmacy.    Hypothyroidism Continue levothyroxine 75 mcg p.o. daily. Monitor TSH as needed.     DVT prophylaxis: On warfarin. Code Status: Full code. Family Communication: His wife and 2 daughters were present in the ED room Disposition Plan: Observation for TIA work-up. Consults called: Neuro hospitalist consult was requested by Dr. Nicholes Stairs. Admission status: Observation/telemetry.   Reubin Milan MD [facility]  05/30/2018, 4:31 AM

## 2018-05-30 NOTE — Progress Notes (Signed)
[name] is a 85 y.o. male patient admitted from ED awake, alert - oriented  X 3- no acute distress noted.  VSS - Blood pressure 131/75, pulse 76, temperature 97.9 F (36.6 C), temperature source Oral, resp. rate 17, SpO2 96 %.    IV in place, occlusive dsg intact without redness.  Orientation to room, and floor completed with information packet given to patient/family.  Patient declined safety video at this time.  Admission INP armband ID verified with patient/family, and in place.   SR up x 2, fall assessment complete, with patient and family able to verbalize understanding of risk associated with falls, and verbalized understanding to call nsg before up out of bed.  Call light within reach, patient able to voice, and demonstrate understanding.  Pt has MASD on buttock and groin, ecchymosis on left shoulder and back, and abrasion on right elbow noted on exam     Will cont to eval and treat per MD orders.  Luci Bank, RN 05/30/2018 7:15 PM

## 2018-05-30 NOTE — Progress Notes (Signed)
Paged NP Baltazar Najjar at [phone] about patient BP 204/110 and 201/95 and NIH of 4. PRN Hydralazine given; will continue to monitor.   During 0000 NIH assessment patient appeared frustrated and stated feels like thoughts are running together. Needed to be re-directed during NIH assessment. Responses are delayed, however, patient able to complete assessment upon re-direction.  Patient frustrated with urinary incontinence and inability to urinate using urinal. Expressed concern that his needs were time-consuming to staff. Provided patient with reassurance that soiled linen could be cleaned and that he was in a hospital where staff are here to meet his needs. Encouraged patient to relax, try to rest and try to worry less about his perception as a patient.   Paged Baltazar Najjar about BP at 0057 188/88. One time dose of 5mg  Hydralazine given, but not given at scheduled time; IV access lost and IV team consulted.   During neuro/NIH assessments/reassessments patient answered year incorrectly each time. Patient had difficulty reading sentences and words during NIH assessment. Patient indicated starting on 3/9 he felt as though all of his thoughts were running together and words on pages were running together therefore he was having trouble reading and his responses were delayed. However, with re-direction (pointing to sentence/word that needed to be read) patient was able to read word/sentence.

## 2018-05-30 NOTE — Progress Notes (Signed)
  Echocardiogram 2D Echocardiogram definity has been performed.  [name] M 05/30/2018, 2:43 PM

## 2018-05-30 NOTE — ED Notes (Signed)
Patient transported to MRI 

## 2018-05-30 NOTE — Progress Notes (Signed)
Carotid artery duplex completed. Refer to "CV Proc" under chart review to view preliminary results.  05/30/2018 4:32 PM Maudry Mayhew, MHA, RVT, RDCS, RDMS

## 2018-05-30 NOTE — ED Notes (Signed)
Admitting Provider at bedside. 

## 2018-05-30 NOTE — Progress Notes (Signed)
[name] is a 85 y.o. male with medical history significant of atrial flutter, paroxysmal A. fib, BPH, CAD, history of MI, degenerative disc disease, type 2 diabetes, dyslipidemia, hemorrhoids, history of left buttock MRSA abscess, hyperlipidemia, hypertension, hypothyroidism, Mnire's disease, rotator cuff injury, history of stroke, history of seizure disorder, history of syncopal episode who is coming to the emergency department after becoming confused and having slurred speech.  He was admitted earlier today. See Dr Olevia Bowens note from earlier today for detailed H&P.  Plan for EEG.  Social work consult for SNF.  [name] MD [phone]

## 2018-05-30 NOTE — ED Notes (Signed)
ED TO INPATIENT HANDOFF REPORT  ED Nurse Name and Phone #: [phone]  S Name/Age/Gender [name] 85 y.o. male Room/Bed: 031C/031C  Code Status   Code Status: Full Code  Home/SNF/Other Home Patient oriented to: self, place and situation Is this baseline? Yes   Triage Complete: Triage complete  Chief Complaint AMS  Triage Note Per EMS, pt from home, pt's family reports AMS, became confused for a short time.  Pt is A&O x 4 with EMS.  Family reports pt have been on abx for bronchitis and have had gait problems that is worsening for the past 2 weeks.     Allergies No Known Allergies  Level of Care/Admitting Diagnosis ED Disposition    ED Disposition Condition Comment   Admit  Hospital Area: [facility] [[id number]]  Level of Care: Progressive [102]  I expect the patient will be discharged within 24 hours: No (not a candidate for 5C-Observation unit)  Diagnosis: AMS (altered mental status) [[id number]]  Admitting Physician: Reubin Milan [[id number]]  Attending Physician: Reubin Milan [[id number]]  PT Class (Do Not Modify): Observation [104]  PT Acc Code (Do Not Modify): Observation [10022]       B Medical/Surgery History Past Medical History:  Diagnosis Date  . Atrial flutter ([place])   . BPH (benign prostatic hyperplasia)   . Coronary artery disease    a. s/p MI tx with Cypher DES to pRCA in 4/04;  b. Echocardiogram 7/09: Normal LV function.  c. Nuclear study 3/13 no ischemia;  d. ETT 2/14 neg;  e. admx with CP => [facility] (01/30/2013):  pLAD 40-50%, oD1 70-80%, oOM1 40%, pRCA stent patent, mid RCA 30%, EF 60-65%. => med Rx.  . DDD (degenerative disc disease)   . Diabetes mellitus without complication ([place])   . Dyslipidemia   . Hemorrhoids   . Hx MRSA infection    left buttocks abscess  . Hx of echocardiogram    a. Echocardiogram (01/31/2013): Mild focal basal and mild concentric hypertrophy of the septum, EF 50-55%, normal wall motion, grade 1  diastolic dysfunction, trivial AI, MAC, mild LAE, PASP 35  . Hyperlipidemia   . Hypertension   . Hypothyroidism   . Meniere's disease    Status post shunt  . Myocardial infarction ([place]) 2008  . Occlusion and stenosis of carotid artery without mention of cerebral infarction    40-59% on carotid doppler 2014; [place] (01/2013): R 1-39%, L 60-79%  . Rotator cuff injury    chronic rotator cuff injury status post repair  . Seizure disorder ([place])   . Stroke [facility])    a. 01/2013=> post cardiac cath CVA to L post communicating artery system; R sided weakness  . Syncope    Past Surgical History:  Procedure Laterality Date  . COLONOSCOPY    . CORONARY ANGIOPLASTY WITH STENT PLACEMENT  07/14/2002   Stent to the right coronary artery  . ENDOLYMPHATIC SHUNT DECOMPRESSION  06/11/2009    Right endolymphatic sac decompression and shunt  placement  . HERNIA REPAIR  04/10/2008   scrotal hernia repair  . LEFT HEART CATHETERIZATION WITH CORONARY ANGIOGRAM N/A 01/30/2013   Procedure: LEFT HEART CATHETERIZATION WITH CORONARY ANGIOGRAM;  Surgeon: Larey Dresser, MD;  Location[facility] C[facility] LAB;  Service: Cardiovascular;  Laterality: N/A;  . ROTATOR CUFF REPAIR     for chronic rotator cuff injury     A IV Location/Drains/Wounds Patient Lines/Drains/Airways Status   Active Line/Drains/Airways    Name:   Placement date:  Placement time:   Site:   Days:   Peripheral IV 05/30/18 Left;Anterior Forearm   05/30/18    0108    Forearm   less than 1   Wound 02/01/13 Laceration Head from where hit head when fell   02/01/13    -    Head   1944          Intake/Output Last 24 hours  Intake/Output Summary (Last 24 hours) at 05/30/2018 1655 Last data filed at 05/29/2018 2324 Gross per 24 hour  Intake -  Output 150 ml  Net -150 ml    Labs/Imaging Results for orders placed or performed during the hospital encounter of 05/29/18 (from the past 48 hour(s))  Urinalysis, Routine w reflex microscopic     Status: Abnormal    Collection Time: 05/29/18 11:31 PM  Result Value Ref Range   Color, Urine YELLOW YELLOW   APPearance HAZY (A) CLEAR   Specific Gravity, Urine 1.024 1.005 - 1.030   pH 6.0 5.0 - 8.0   Glucose, UA NEGATIVE NEGATIVE mg/dL   Hgb urine dipstick NEGATIVE NEGATIVE   Bilirubin Urine NEGATIVE NEGATIVE   Ketones, ur NEGATIVE NEGATIVE mg/dL   Protein, ur 30 (A) NEGATIVE mg/dL   Nitrite NEGATIVE NEGATIVE   Leukocytes,Ua NEGATIVE NEGATIVE   RBC / HPF 0-5 0 - 5 RBC/hpf   WBC, UA 0-5 0 - 5 WBC/hpf   Bacteria, UA RARE (A) NONE SEEN   Mucus PRESENT    Hyaline Casts, UA PRESENT     Comment: Performed at [facility], [address] [place], [place] [address]  CBC with Differential/Platelet     Status: Abnormal   Collection Time: 05/30/18  1:10 AM  Result Value Ref Range   WBC 12.1 (H) 4.0 - 10.5 K/uL   RBC 4.15 (L) 4.22 - 5.81 MIL/uL   Hemoglobin 12.2 (L) 13.0 - 17.0 g/dL   HCT 38.9 (L) 39.0 - 52.0 %   MCV 93.7 80.0 - 100.0 fL   MCH 29.4 26.0 - 34.0 pg   MCHC 31.4 30.0 - 36.0 g/dL   RDW 13.9 11.5 - 15.5 %   Platelets 278 150 - 400 K/uL   nRBC 0.0 0.0 - 0.2 %   Neutrophils Relative % 71 %   Neutro Abs 8.6 (H) 1.7 - 7.7 K/uL   Lymphocytes Relative 17 %   Lymphs Abs 2.0 0.7 - 4.0 K/uL   Monocytes Relative 7 %   Monocytes Absolute 0.9 0.1 - 1.0 K/uL   Eosinophils Relative 3 %   Eosinophils Absolute 0.3 0.0 - 0.5 K/uL   Basophils Relative 1 %   Basophils Absolute 0.1 0.0 - 0.1 K/uL   Immature Granulocytes 1 %   Abs Immature Granulocytes 0.09 (H) 0.00 - 0.07 K/uL    Comment: Performed at [facility] [address], [place], [place] [address]  Basic metabolic panel     Status: Abnormal   Collection Time: 05/30/18  1:10 AM  Result Value Ref Range   Sodium 131 (L) 135 - 145 mmol/L   Potassium 4.9 3.5 - 5.1 mmol/L   Chloride 99 98 - 111 mmol/L   CO2 23 22 - 32 mmol/L   Glucose, Bld 122 (H) 70 - 99 mg/dL   BUN 19 8 - 23 mg/dL   Creatinine, Ser 1.17 0.61 - 1.24 mg/dL    Calcium 8.9 8.9 - 10.3 mg/dL   GFR calc non Af Amer 58 (L) >60 mL/min   GFR calc  Af Amer >60 >60 mL/min   Anion gap 9 5 - 15    Comment: Performed at [facility] [address] [place], [place] [address]  I-stat troponin, ED     Status: None   Collection Time: 05/30/18  1:10 AM  Result Value Ref Range   Troponin i, poc 0.06 0.00 - 0.08 ng/mL   Comment 3            Comment: Due to the release kinetics of cTnI, a negative result within the first hours of the onset of symptoms does not rule out myocardial infarction with certainty. If myocardial infarction is still suspected, repeat the test at appropriate intervals.   Protime-INR     Status: Abnormal   Collection Time: 05/30/18  1:10 AM  Result Value Ref Range   Prothrombin Time 18.8 (H) 11.4 - 15.2 seconds   INR 1.6 (H) 0.8 - 1.2    Comment: (NOTE) INR goal varies based on device and disease states. Performed at [facility], [place] [address], [place], [place] [address]   Carbamazepine level, total     Status: None   Collection Time: 05/30/18  1:10 AM  Result Value Ref Range   Carbamazepine Lvl 10.0 4.0 - 12.0 ug/mL    Comment: Performed at [facility] [address], [place], [place] [address]  Ammonia     Status: None   Collection Time: 05/30/18  1:11 AM  Result Value Ref Range   Ammonia 25 9 - 35 umol/L    Comment: Performed at [facility], [place] [address], [place], [place] [address]  I-STAT 7, (LYTES, BLD GAS, ICA, H+H)     Status: Abnormal   Collection Time: 05/30/18  1:45 AM  Result Value Ref Range   pH, Arterial 7.422 7.350 - 7.450   pCO2 arterial 38.6 32.0 - 48.0 mmHg   pO2, Arterial 77.0 (L) 83.0 - 108.0 mmHg   Bicarbonate 25.3 20.0 - 28.0 mmol/L   TCO2 26 22 - 32 mmol/L   O2 Saturation 96.0 %   Acid-Base Excess 1.0 0.0 - 2.0 mmol/L   Sodium 132 (L) 135 - 145 mmol/L   Potassium 4.7 3.5 - 5.1 mmol/L   Calcium, Ion 1.20 1.15 - 1.40 mmol/L   HCT 35.0 (L) 39.0 - 52.0 %   Hemoglobin  11.9 (L) 13.0 - 17.0 g/dL   Patient temperature 97.9 F    Collection site RADIAL, ALLEN'S TEST ACCEPTABLE    Drawn by Operator    Sample type ARTERIAL   CBC WITH DIFFERENTIAL     Status: Abnormal   Collection Time: 05/30/18  3:26 AM  Result Value Ref Range   WBC 12.5 (H) 4.0 - 10.5 K/uL   RBC 4.20 (L) 4.22 - 5.81 MIL/uL   Hemoglobin 13.0 13.0 - 17.0 g/dL   HCT 39.2 39.0 - 52.0 %   MCV 93.3 80.0 - 100.0 fL   MCH 31.0 26.0 - 34.0 pg   MCHC 33.2 30.0 - 36.0 g/dL   RDW 14.1 11.5 - 15.5 %   Platelets 272 150 - 400 K/uL   nRBC 0.0 0.0 - 0.2 %   Neutrophils Relative % 68 %   Neutro Abs 8.5 (H) 1.7 - 7.7 K/uL   Lymphocytes Relative 18 %   Lymphs Abs 2.3 0.7 - 4.0 K/uL   Monocytes Relative 8 %   Monocytes Absolute 1.0 0.1 - 1.0 K/uL   Eosinophils Relative 4 %   Eosinophils  Absolute 0.5 0.0 - 0.5 K/uL   Basophils Relative 1 %   Basophils Absolute 0.1 0.0 - 0.1 K/uL   Immature Granulocytes 1 %   Abs Immature Granulocytes 0.12 (H) 0.00 - 0.07 K/uL    Comment: Performed at [facility], [place] [address] [place], [place] [address]  Comprehensive metabolic panel     Status: Abnormal   Collection Time: 05/30/18  3:26 AM  Result Value Ref Range   Sodium 133 (L) 135 - 145 mmol/L   Potassium 4.8 3.5 - 5.1 mmol/L   Chloride 98 98 - 111 mmol/L   CO2 23 22 - 32 mmol/L   Glucose, Bld 107 (H) 70 - 99 mg/dL   BUN 21 8 - 23 mg/dL   Creatinine, Ser 1.13 0.61 - 1.24 mg/dL   Calcium 9.0 8.9 - 10.3 mg/dL   Total Protein 6.1 (L) 6.5 - 8.1 g/dL   Albumin 3.7 3.5 - 5.0 g/dL   AST 19 15 - 41 U/L   ALT 15 0 - 44 U/L   Alkaline Phosphatase 71 38 - 126 U/L   Total Bilirubin 0.9 0.3 - 1.2 mg/dL   GFR calc non Af Amer >60 >60 mL/min   GFR calc Af Amer >60 >60 mL/min   Anion gap 12 5 - 15    Comment: Performed at [facility] [address] [place], [place] [address]  Protime-INR     Status: Abnormal   Collection Time: 05/30/18  3:26 AM  Result Value Ref Range   Prothrombin Time 18.8 (H)  11.4 - 15.2 seconds   INR 1.6 (H) 0.8 - 1.2    Comment: (NOTE) INR goal varies based on device and disease states. Performed at [facility], [place] [address], [place], [place] [address]   Hemoglobin A1c     Status: Abnormal   Collection Time: 05/30/18  3:26 AM  Result Value Ref Range   Hgb A1c MFr Bld 6.6 (H) 4.8 - 5.6 %    Comment: (NOTE) Pre diabetes:          5.7%-6.4% Diabetes:              >6.4% Glycemic control for   <7.0% adults with diabetes    Mean Plasma Glucose 142.72 mg/dL    Comment: Performed at [facility] [address] [place],  [address]  Lipid panel     Status: Abnormal   Collection Time: 05/30/18  4:40 AM  Result Value Ref Range   Cholesterol 179 0 - 200 mg/dL   Triglycerides 131 <150 mg/dL   HDL 50 >40 mg/dL   Total CHOL/HDL Ratio 3.6 RATIO   VLDL 26 0 - 40 mg/dL   LDL Cholesterol 103 (H) 0 - 99 mg/dL    Comment:        Total Cholesterol/HDL:CHD Risk Coronary Heart Disease Risk Table                     Men   Women  1/2 Average Risk   3.4   3.3  Average Risk       5.0   4.4  2 X Average Risk   9.6   7.1  3 X Average Risk  23.4   11.0        Use the calculated Patient Ratio above and the CHD Risk Table to determine the patient's CHD Risk.        ATP III CLASSIFICATION (LDL):  <  100     mg/dL   Optimal  100-129  mg/dL   Near or Above                    Optimal  130-159  mg/dL   Borderline  160-189  mg/dL   High  >190     mg/dL   Very High Performed at [facility] [address] [place], [place] [address]   CBG monitoring, ED     Status: None   Collection Time: 05/30/18  7:54 AM  Result Value Ref Range   Glucose-Capillary 97 70 - 99 mg/dL   Comment 1 Notify RN    Comment 2 Document in Chart    Dg Chest 2 View  Result Date: 05/29/2018 CLINICAL DATA:  Altered mental status EXAM: CHEST - 2 VIEW COMPARISON:  09/06/2016 FINDINGS: Shallow inspiration. Heart size and pulmonary vascularity are normal. Lungs appear clear  and expanded. No blunting of costophrenic angles. No pneumothorax. Mediastinal contours appear intact. Calcified and tortuous aorta. IMPRESSION: No active cardiopulmonary disease. Electronically Signed   By: Lucienne Capers M.D.   On: 05/29/2018 23:37   Ct Head Wo Contrast  Result Date: 05/30/2018 CLINICAL DATA:  85 year old male with confusion, altered mental status. EXAM: CT HEAD WITHOUT CONTRAST TECHNIQUE: Contiguous axial images were obtained from the base of the skull through the vertex without intravenous contrast. COMPARISON:  12/20/2015 and earlier. FINDINGS: Brain: Cerebral volume is not significantly changed. Stable ventricle size and configuration. Chronic left thalamic lacune is stable. No midline shift, mass effect, evidence of mass lesion, intracranial hemorrhage or evidence of cortically based acute infarction. No cortical encephalomalacia identified. Vascular: Calcified atherosclerosis at the skull base. New calcified plaque in a right MCA branch on series 3, image 17, but no other suspicious intracranial vascular hyperdensity. Skull: No acute osseous abnormality identified. Sinuses/Orbits: Stable and pneumatized right mastoidectomy. Paranasal sinuses and left mastoids are stable and well pneumatized. Other: No acute orbit or scalp soft tissue finding. IMPRESSION: 1. No intracranial hemorrhage or acute cortically based infarct identified. But a small focus of calcified plaque in a Right MCA branch is new since 2017. If the patient has left side weakness or other right MCA ischemic symptoms then consider acute thromboembolic disease. 2. Chronic small vessel ischemia in the left thalamus. Electronically Signed   By: Genevie Ann M.D.   On: 05/30/2018 00:04   Mr Jodene Nam Head Wo Contrast  Result Date: 05/30/2018 CLINICAL DATA:  Confusion. Worsening gait difficulties for 2 weeks. History of hypertension, hyperlipidemia, diabetes and stroke. EXAM: MRI HEAD WITHOUT CONTRAST MRA HEAD WITHOUT CONTRAST  TECHNIQUE: Multiplanar, multiecho pulse sequences of the brain and surrounding structures were obtained without intravenous contrast. Angiographic images of the head were obtained using MRA technique without contrast. COMPARISON:  CT HEAD May 29, 2018 and MRI/MRA head January 30, 2013 FINDINGS: MRI HEAD FINDINGS-sequences are mildly or moderately motion degraded. INTRACRANIAL CONTENTS: No reduced diffusion to suggest acute ischemia. No susceptibility artifact to suggest hemorrhage. No parenchymal brain volume loss for age. No hydrocephalus. Small area LEFT occipital lobe encephalomalacia. Moderate parenchymal brain volume loss, sulcal effacement at the convexities with narrowed callosum angle. Patchy supratentorial white matter FLAIR T2 hyperintensities. Old bilateral basal ganglia and LEFT thalamus lacunar infarcts. No suspicious parenchymal signal, masses, mass effect. No abnormal extra-axial fluid collections. No extra-axial masses. VASCULAR: Normal major intracranial vascular flow voids present at skull base. SKULL AND UPPER CERVICAL SPINE: No abnormal sellar expansion. No suspicious calvarial bone  marrow signal. Craniocervical junction maintained. SINUSES/ORBITS: The mastoid air-cells and included paranasal sinuses are well-aerated.The included ocular globes and orbital contents are non-suspicious. Status post bilateral ocular lens implants. OTHER: None. MRA HEAD FINDINGS ANTERIOR CIRCULATION: Normal flow related enhancement of the included cervical, petrous, cavernous and supraclinoid internal carotid arteries. Patent anterior communicating artery. Patent anterior and middle cerebral arteries. No large vessel occlusion, flow limiting stenosis, or aneurysm. POSTERIOR CIRCULATION: Codominant vertebral arteries. Vertebrobasilar arteries are patent, with normal flow related enhancement of the main branch vessels. Occluded LEFT P2 segment. Patent RIGHT posterior cerebral artery. Moderate stenosis RIGHT distal P2  segment. Small bilateral posterior communicating arteries present. No flow limiting stenosis, or aneurysm. ANATOMIC VARIANTS: Partially fenestrated distal RIGHT vertebral arteries. Source data and MIP images were reviewed. IMPRESSION: MRI HEAD: 1. Motion degraded examination.  No acute intracranial process. 2. Old LEFT occipital/PCA territory infarct. Old small basal ganglia and LEFT thalamus infarcts. 3. Moderate chronic small vessel ischemic changes. 4. 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 It has MRA HEAD: 1. No emergent large vessel occlusion or flow-limiting stenosis. 2. Chronically occluded LEFT PCA. Moderate stenosis RIGHT P2 segment. Electronically Signed   By: Elon Alas M.D.   On: 05/30/2018 06:26   [name]  Result Date: 05/30/2018 CLINICAL DATA:  Confusion. Worsening gait difficulties for 2 weeks. History of hypertension, hyperlipidemia, diabetes and stroke. EXAM: MRI HEAD WITHOUT CONTRAST MRA HEAD WITHOUT CONTRAST TECHNIQUE: Multiplanar, multiecho pulse sequences of the brain and surrounding structures were obtained without intravenous contrast. Angiographic images of the head were obtained using MRA technique without contrast. COMPARISON:  CT HEAD May 29, 2018 and MRI/MRA head January 30, 2013 FINDINGS: MRI HEAD FINDINGS-sequences are mildly or moderately motion degraded. INTRACRANIAL CONTENTS: No reduced diffusion to suggest acute ischemia. No susceptibility artifact to suggest hemorrhage. No parenchymal brain volume loss for age. No hydrocephalus. Small area LEFT occipital lobe encephalomalacia. Moderate parenchymal brain volume loss, sulcal effacement at the convexities with narrowed callosum angle. Patchy supratentorial white matter FLAIR T2 hyperintensities. Old bilateral basal ganglia and LEFT thalamus lacunar infarcts. No suspicious parenchymal signal, masses, mass effect. No abnormal extra-axial fluid collections. No extra-axial masses. VASCULAR:  Normal major intracranial vascular flow voids present at skull base. SKULL AND UPPER CERVICAL SPINE: No abnormal sellar expansion. No suspicious calvarial bone marrow signal. Craniocervical junction maintained. SINUSES/ORBITS: The mastoid air-cells and included paranasal sinuses are well-aerated.The included ocular globes and orbital contents are non-suspicious. Status post bilateral ocular lens implants. OTHER: None. MRA HEAD FINDINGS ANTERIOR CIRCULATION: Normal flow related enhancement of the included cervical, petrous, cavernous and supraclinoid internal carotid arteries. Patent anterior communicating artery. Patent anterior and middle cerebral arteries. No large vessel occlusion, flow limiting stenosis, or aneurysm. POSTERIOR CIRCULATION: Codominant vertebral arteries. Vertebrobasilar arteries are patent, with normal flow related enhancement of the main branch vessels. Occluded LEFT P2 segment. Patent RIGHT posterior cerebral artery. Moderate stenosis RIGHT distal P2 segment. Small bilateral posterior communicating arteries present. No flow limiting stenosis, or aneurysm. ANATOMIC VARIANTS: Partially fenestrated distal RIGHT vertebral arteries. Source data and MIP images were reviewed. IMPRESSION: MRI HEAD: 1. Motion degraded examination.  No acute intracranial process. 2. Old LEFT occipital/PCA territory infarct. Old small basal ganglia and LEFT thalamus infarcts. 3. Moderate chronic small vessel ischemic changes. 4. 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 It has MRA HEAD: 1. No emergent large vessel occlusion or flow-limiting stenosis. 2. Chronically occluded LEFT PCA. Moderate stenosis RIGHT P2 segment. Electronically Signed   By: Elon Alas  M.D.   On: 05/30/2018 06:26   Vas US Carotid (at [facility] Only)  Result Date: 05/30/2018 Carotid Arterial Duplex Study Indications: TIA and Speech disturbance. Performing Technologist: Maudry Mayhew MHA, RDMS, RVT, RDCS  Examination  Guidelines: A complete evaluation includes B-mode imaging, spectral Doppler, color Doppler, and power Doppler as needed of all accessible portions of each vessel. Bilateral testing is considered an integral part of a complete examination. Limited examinations for reoccurring indications may be performed as noted.  Right Carotid Findings: +----------+-------+-------+--------+---------------------------------+--------+           PSV    EDV    StenosisDescribe                         Comments           cm/s   cm/s                                                     +----------+-------+-------+--------+---------------------------------+--------+ CCA Prox  154    18             irregular and heterogenous                +----------+-------+-------+--------+---------------------------------+--------+ CCA Distal143    31             heterogenous and calcific                 +----------+-------+-------+--------+---------------------------------+--------+ ICA Prox  173    28             irregular, heterogenous and                                               calcific                                  +----------+-------+-------+--------+---------------------------------+--------+ ICA Distal101    24                                                       +----------+-------+-------+--------+---------------------------------+--------+ ECA       144    14             heterogenous, calcific and                                                irregular                                 +----------+-------+-------+--------+---------------------------------+--------+ +----------+--------+-------+----------------+-------------------+           PSV cm/sEDV cmsDescribe        Arm Pressure (mmHG) +----------+--------+-------+----------------+-------------------+ [id number]             Multiphasic, WNL                     +----------+--------+-------+----------------+-------------------+ +---------+--------+--+--------+--+---------+  VertebralPSV cm/s46EDV cm/s13Antegrade +---------+--------+--+--------+--+---------+  Left Carotid Findings: +----------+-------+-------+--------+---------------------------------+--------+           PSV    EDV    StenosisDescribe                         Comments           cm/s   cm/s                                                     +----------+-------+-------+--------+---------------------------------+--------+ CCA Prox  111    16             heterogenous and smooth                   +----------+-------+-------+--------+---------------------------------+--------+ CCA Distal72     24             smooth and heterogenous                   +----------+-------+-------+--------+---------------------------------+--------+ ICA Prox  75     20                                                       +----------+-------+-------+--------+---------------------------------+--------+ ICA Distal120    26                                                       +----------+-------+-------+--------+---------------------------------+--------+ ECA       97     12             heterogenous, calcific and                                                irregular                                 +----------+-------+-------+--------+---------------------------------+--------+ +----------+--------+--------+----------------+-------------------+ SubclavianPSV cm/sEDV cm/sDescribe        Arm Pressure (mmHG) +----------+--------+--------+----------------+-------------------+           248             Multiphasic, WNL                    +----------+--------+--------+----------------+-------------------+ +---------+--------+--+--------+--+---------+ VertebralPSV cm/s54EDV cm/s11Antegrade +---------+--------+--+--------+--+---------+  Summary: Right Carotid:  Velocities in the right ICA are consistent with a 1-39% stenosis. Left Carotid: Velocities in the left ICA are consistent with a 1-39% stenosis. Vertebrals:  Bilateral vertebral arteries demonstrate antegrade flow. Subclavians: Normal flow hemodynamics were seen in bilateral subclavian              arteries. *See table(s) above for measurements and observations.     Preliminary     Pending Labs Unresulted Labs (From admission, onward)    Start     Ordered   06/06/18 0500  Creatinine,  serum  (enoxaparin (LOVENOX)    CrCl >/= 30 ml/min)  Weekly,   R    Comments:  while on enoxaparin therapy    05/30/18 0303   05/30/18 0500  Protime-INR  Daily,   R     05/30/18 0313          Vitals/Pain Today's Vitals   05/30/18 1245 05/30/18 1300 05/30/18 1315 05/30/18 1345  BP: (!) 142/82 132/71 (!) 156/77 (!) 150/72  Pulse:      Resp: 12 (!) 21 (!) 22 18  Temp:      TempSrc:      SpO2:      PainSc:        Isolation Precautions No active isolations  Medications Medications  ondansetron (ZOFRAN) tablet 4 mg (has no administration in time range)    Or  ondansetron (ZOFRAN) injection 4 mg (has no administration in time range)  acetaminophen (TYLENOL) tablet 650 mg (has no administration in time range)    Or  acetaminophen (TYLENOL) suppository 650 mg (has no administration in time range)  atorvastatin (LIPITOR) tablet 80 mg (has no administration in time range)  metFORMIN (GLUCOPHAGE) tablet 500 mg (500 mg Oral Given 05/30/18 0813)  levothyroxine (SYNTHROID, LEVOTHROID) tablet 75 mcg (75 mcg Oral Given 05/30/18 1026)  isosorbide mononitrate (IMDUR) 24 hr tablet 30 mg (30 mg Oral Given 05/30/18 1026)  escitalopram (LEXAPRO) tablet 10 mg (10 mg Oral Given 05/30/18 1026)  diazepam (VALIUM) tablet 2 mg (has no administration in time range)  carbamazepine (TEGRETOL XR) 12 hr tablet 300 mg (300 mg Oral Given 05/30/18 1426)  Warfarin - Pharmacist Dosing Inpatient (has no administration in time range)   warfarin (COUMADIN) tablet 15 mg (has no administration in time range)   stroke: mapping our early stages of recovery book (has no administration in time range)  perflutren lipid microspheres (DEFINITY) IV suspension (2 mLs Intravenous Given 05/30/18 1439)    Mobility walks with device Low fall risk   Focused Assessments Neuro Assessment Handoff:  Swallow screen pass? Yes  Cardiac Rhythm: Normal sinus rhythm NIH Stroke Scale ( + Modified Stroke Scale Criteria)  Interval: Shift assessment Level of Consciousness (1a.)   : Alert, keenly responsive LOC Questions (1b. )   +: Answers both questions correctly LOC Commands (1c. )   + : Performs both tasks correctly Best Gaze (2. )  +: Normal Visual (3. )  +: No visual loss Facial Palsy (4. )    : Normal symmetrical movements Motor Arm, Left (5a. )   +: No drift Motor Arm, Right (5b. )   +: Drift Motor Leg, Left (6a. )   +: No drift Motor Leg, Right (6b. )   +: Drift Limb Ataxia (7. ): Present in one limb Sensory (8. )   +: Normal, no sensory loss Best Language (9. )   +: No aphasia Dysarthria (10. ): Normal Extinction/Inattention (11.)   +: No Abnormality Modified SS Total  +: 2 Complete NIHSS TOTAL: 3 Last date known well: 05/29/18 Last time known well: 1700 Neuro Assessment: Within Defined Limits Neuro Checks:   Initial (05/29/18 2307)  Last Documented NIHSS Modified Score: 2 (05/30/18 0800) Has TPA been given? No If patient is a Neuro Trauma and patient is going to OR before floor call report to [facility] nurse: [phone] or [phone]     R Recommendations: See Admitting Provider Note  Report given to:   Additional Notes: .

## 2018-05-30 NOTE — ED Notes (Signed)
Attempted IV access and blood draw x2 without success. Seeking assistance.

## 2018-05-30 NOTE — Progress Notes (Signed)
Physical Therapy Evaluation Patient Details Name: [name] MRN: [id number] DOB: [birth year] Today's Date: 05/30/2018   History of Present Illness  Patient is 85 y/o male presenting to hospital with AMS and slurred speech. MRI negative for acute findings. PMH includes CAD s/p PCI, HTN, CVA, dCHF, paroxysmal afib, meniere's disease, seizures, and MI.   Clinical Impression  Patient presenting to hospital secondary to problems above and with deficits below. Patient required modA +2 to stand and take side steps at EOB with RW. Patient with poor sitting and standing balance and requiring multimodal cues and increased time for sequencing. Patient A&Ox4 but wife and patient reporting conflicting information regarding PLOF. Given functional mobility deficits and high fall risk, recommending SNF level therapy following d/c. Patient will benefit from acute physical therapy to maximize independence and safety with functional mobility.     Follow Up Recommendations SNF;Supervision/Assistance - 24 hour    Equipment Recommendations  Other (comment)(TBD at next venue)    Recommendations for Other Services       Precautions / Restrictions Precautions Precautions: Fall Restrictions Weight Bearing Restrictions: No      Mobility  Bed Mobility Overal bed mobility: Needs Assistance Bed Mobility: Supine to Sit;Sit to Supine     Supine to sit: Min assist Sit to supine: Min assist   General bed mobility comments: Patient required minA for all bed mobility. Required minA for trunk control and to maintain sitting balance at EOB. Required use of sheet to bring hips towards EOB. Required minA for LE management to return to supine.  Transfers Overall transfer level: Needs assistance Equipment used: Rolling walker (2 wheeled) Transfers: Sit to/from Stand Sit to Stand: Mod assist;+2 physical assistance         General transfer comment: Patient required modA+2 for lift assist to stand with use of  RW. Required minA to maintain standing balance with use of RW.    Ambulation/Gait Ambulation/Gait assistance: Mod assist;+2 physical assistance   Assistive device: Rolling walker (2 wheeled)       General Gait Details: Patient required modA +2 for steadying to take side steps at EOB with RW. Patient with difficulty sequencing and required verbal and tactile cues to take steps.   Stairs            Wheelchair Mobility    Modified Rankin (Stroke Patients Only)       Balance Overall balance assessment: Needs assistance Sitting-balance support: Feet supported;Bilateral upper extremity supported Sitting balance-Leahy Scale: Poor Sitting balance - Comments: required BUE and external support to maintain sitting balance   Standing balance support: Bilateral upper extremity supported Standing balance-Leahy Scale: Poor Standing balance comment: reliant on BUE and external support to maintain standing balance                             Pertinent Vitals/Pain Pain Assessment: Faces Faces Pain Scale: No hurt    Home Living Family/patient expects to be discharged to:: Private residence Living Arrangements: Spouse/significant other Available Help at Discharge: Family;Available 24 hours/day Type of Home: House Home Access: Stairs to enter Entrance Stairs-Rails: Right;Left;Can reach both Entrance Stairs-Number of Steps: 2 Home Layout: One level Home Equipment: Walker - 2 wheels;Walker - 4 wheels;Cane - single point;Shower seat      Prior Function Level of Independence: Needs assistance   Gait / Transfers Assistance Needed: Patient reports he occasionally used cane however wife reports he did not.   ADL's / [employer]  Needed: Reports needing occasional assistance for ADLs  Comments: Wife and patient reporting conflicting information.      Hand Dominance        Extremity/Trunk Assessment   Upper Extremity Assessment Upper Extremity Assessment:  Defer to OT evaluation    Lower Extremity Assessment Lower Extremity Assessment: Generalized weakness    Cervical / Trunk Assessment Cervical / Trunk Assessment: Normal  Communication   Communication: No difficulties  Cognition Arousal/Alertness: Awake/alert Behavior During Therapy: WFL for tasks assessed/performed Overall Cognitive Status: Impaired/Different from baseline Area of Impairment: Following commands;Problem solving                       Following Commands: Follows one step commands inconsistently;Follows one step commands with increased time     Problem Solving: Slow processing;Difficulty sequencing;Requires verbal cues;Requires tactile cues General Comments: Patient A&Ox4. Patient with difficulty sequencing and following commands. Required multimodal cues and increased time for all mobility tasks.       General Comments General comments (skin integrity, edema, etc.): Patient with incontinence. Patient and wife refusing to change soiled clothing. Educated patient and wife of short term SNF level therapy following d/c.    Exercises     Assessment/Plan    PT Assessment Patient needs continued PT services  PT Problem List Decreased strength;Decreased activity tolerance;Decreased balance;Decreased mobility;Decreased cognition;Decreased knowledge of use of DME       PT Treatment Interventions DME instruction;Gait training;Stair training;Functional mobility training;Therapeutic activities;Therapeutic exercise;Balance training;Patient/family education    PT Goals (Current goals can be found in the Care Plan section)  Acute Rehab PT Goals Patient Stated Goal: none stated PT Goal Formulation: With patient Time For Goal Achievement: 06/13/18 Potential to Achieve Goals: Fair    Frequency Min 2X/week   Barriers to discharge        Co-evaluation               AM-PAC PT "6 Clicks" Mobility  Outcome Measure Help needed turning from your back to your  side while in a flat bed without using bedrails?: A Little Help needed moving from lying on your back to sitting on the side of a flat bed without using bedrails?: A Little Help needed moving to and from a bed to a chair (including a wheelchair)?: A Lot Help needed standing up from a chair using your arms (e.g., wheelchair or bedside chair)?: A Lot Help needed to walk in hospital room?: Total Help needed climbing 3-5 steps with a railing? : Total 6 Click Score: 12    End of Session Equipment Utilized During Treatment: Gait belt Activity Tolerance: Patient tolerated treatment well Patient left: in bed;with call bell/phone within reach;with family/visitor present Nurse Communication: Mobility status PT Visit Diagnosis: Unsteadiness on feet (R26.81);Other abnormalities of gait and mobility (R26.89);Difficulty in walking, not elsewhere classified (R26.2);Muscle weakness (generalized) (M62.81)    Time: 1856-3149 PT Time Calculation (min) (ACUTE ONLY): 26 min   Charges:   PT Evaluation $PT Eval Moderate Complexity: 1 Mod PT Treatments $Therapeutic Activity: 8-22 mins        Erick Blinks, SPT  Erick Blinks 05/30/2018, 1:20 PM

## 2018-05-30 NOTE — ED Provider Notes (Signed)
[facility] Provider Note   CSN: [id number] Arrival date & time: 05/29/18  2233    History   Chief Complaint Chief Complaint  Patient presents with  . Altered Mental Status    HPI [name] is a 85 y.o. male.     The history is provided by a relative and the spouse. The history is limited by the condition of the patient.  Altered Mental Status  Presenting symptoms: confusion, lethargy and partial responsiveness   Presenting symptoms: no memory loss   Severity:  Moderate Most recent episode:  Yesterday Episode history:  Single Timing:  Constant Progression:  Resolved Chronicity:  New Context: not alcohol use, not drug use, taking medications as prescribed and not recent illness   Associated symptoms: normal movement, no bladder incontinence, no difficulty breathing, no fever, no hallucinations, no headaches, no suicidal behavior and no vomiting   Has not wanted to driving and has had difficulty walking for several weeks.  No f/c/r.  No vomiting.    Past Medical History:  Diagnosis Date  . Atrial flutter ([place])   . BPH (benign prostatic hyperplasia)   . Coronary artery disease    a. s/p MI tx with Cypher DES to pRCA in 4/04;  b. Echocardiogram 7/09: Normal LV function.  c. Nuclear study 3/13 no ischemia;  d. ETT 2/14 neg;  e. admx with CP => [facility] (01/30/2013):  pLAD 40-50%, oD1 70-80%, oOM1 40%, pRCA stent patent, mid RCA 30%, EF 60-65%. => med Rx.  . DDD (degenerative disc disease)   . Diabetes mellitus without complication ([place])   . Dyslipidemia   . Hemorrhoids   . Hx MRSA infection    left buttocks abscess  . Hx of echocardiogram    a. Echocardiogram (01/31/2013): Mild focal basal and mild concentric hypertrophy of the septum, EF 50-55%, normal wall motion, grade 1 diastolic dysfunction, trivial AI, MAC, mild LAE, PASP 35  . Hyperlipidemia   . Hypertension   . Hypothyroidism   . Meniere's disease    Status post shunt  .  Myocardial infarction ([place]) 2008  . Occlusion and stenosis of carotid artery without mention of cerebral infarction    40-59% on carotid doppler 2014; [place] (01/2013): R 1-39%, L 60-79%  . Rotator cuff injury    chronic rotator cuff injury status post repair  . Seizure disorder ([place])   . Stroke [facility])    a. 01/2013=> post cardiac cath CVA to L post communicating artery system; R sided weakness  . Syncope     Patient Active Problem List   Diagnosis Date Noted  . Fracture of proximal phalanx of finger fifth 06/17/2017 07/21/2017  . Upper airway cough syndrome 11/01/2016  . Atrial flutter ([place]) 08/05/2016  . Coronary artery disease involving native coronary artery of native heart without angina pectoris 08/05/2016  . Chronic anticoagulation 12/31/2015  . History of seizures 12/31/2015  . Acute diastolic CHF (congestive heart failure) ([place]) 12/24/2015  . Pulmonary edema   . Paroxysmal atrial fibrillation ([facility])   . Hypothyroidism   . Controlled type 2 diabetes mellitus with complication, without long-term current use of insulin ([place])   . Hypertensive emergency 12/20/2015  . Special screening for malignant neoplasms, colon 12/07/2013  . Bowel habit changes 12/07/2013  . Diarrhea 03/09/2013  . History of cardioembolic cerebrovascular accident (CVA) 01/31/2013  . Chest pain 01/29/2013  . Bilateral carotid artery disease ([place])   . Loss of coordination 12/03/2012  . Morbid obesity due to  excess calories ([place]) 08/20/2011  . CAD S/P percutaneous coronary angioplasty   . Dyslipidemia   . Hypertension     Past Surgical History:  Procedure Laterality Date  . COLONOSCOPY    . CORONARY ANGIOPLASTY WITH STENT PLACEMENT  07/14/2002   Stent to the right coronary artery  . ENDOLYMPHATIC SHUNT DECOMPRESSION  06/11/2009    Right endolymphatic sac decompression and shunt  placement  . HERNIA REPAIR  04/10/2008   scrotal hernia repair  . LEFT HEART CATHETERIZATION WITH CORONARY ANGIOGRAM N/A  01/30/2013   Procedure: LEFT HEART CATHETERIZATION WITH CORONARY ANGIOGRAM;  Surgeon: Larey Dresser, MD;  Location: [facility] CATH LAB;  Service: Cardiovascular;  Laterality: N/A;  . ROTATOR CUFF REPAIR     for chronic rotator cuff injury        Home Medications    Prior to Admission medications   Medication Sig Start Date End Date Taking? Authorizing Provider  albuterol (PROVENTIL HFA;VENTOLIN HFA) 108 (90 Base) MCG/ACT inhaler Inhale 2 puffs into the lungs every 4 (four) hours as needed for wheezing or shortness of breath. 03/17/18   Delsa Grana, PA-C  amoxicillin (AMOXIL) 875 MG tablet Take 1 tablet (875 mg total) by mouth 2 (two) times daily. 05/19/18   [name], MD  atorvastatin (LIPITOR) 80 MG tablet TAKE 1 TABLET BY MOUTH EVERY DAY 07/25/17   [name], MD  benzonatate (TESSALON) 100 MG capsule Take 1 capsule (100 mg total) by mouth 3 (three) times daily as needed for cough. 03/17/18   Delsa Grana, PA-C  carbamazepine (CARBATROL) 300 MG 12 hr capsule TAKE 1 CAPSULE BY MOUTH TWICE A DAY 10/24/17   [name], MD  cloNIDine (CATAPRES) 0.2 MG tablet Take 1 tablet (0.2 mg total) by mouth 2 (two) times daily. 11/10/17   [name], MD  clotrimazole (LOTRIMIN AF) 1 % cream Apply 1 application topically 2 (two) times daily. 07/18/17   [name], MD  diazepam (VALIUM) 2 MG tablet TAKE 1 TABLET BY MOUTH TWICE A DAY AS NEEDED 02/10/18   [name], MD  escitalopram (LEXAPRO) 10 MG tablet TAKE 1 TABLET BY MOUTH EVERY DAY 12/21/17   [name], MD  fluticasone (FLONASE) 50 MCG/ACT nasal spray Place 2 sprays into both nostrils daily. 09/08/16   [name], MD  furosemide (LASIX) 20 MG tablet TAKE 1 TABLET BY MOUTH EVERY DAY 11/22/17   [name], MD  isosorbide mononitrate (IMDUR) 30 MG 24 hr tablet TAKE 1 TABLET BY MOUTH EVERY DAY 02/15/18   [name], MD  levothyroxine (SYNTHROID, LEVOTHROID) 75 MCG tablet TAKE 1 TABLET BY MOUTH EVERY  DAY 04/13/18   [name], MD  losartan (COZAAR) 100 MG tablet TAKE 1/2 TABLET BY MOUTH 2 TIMES A DAY 10/18/17   [name], MD  metFORMIN (GLUCOPHAGE) 500 MG tablet TAKE 1 TABLET BY MOUTH TWICE A DAY WITH A MEAL 05/08/18   [name], MD  metoprolol tartrate (LOPRESSOR) 100 MG tablet TAKE 1 TABLET BY MOUTH TWICE A DAY 11/18/17   [place], Modena Nunnery, MD  Multiple Vitamins-Minerals (MULTIVITAMINS THER. W/MINERALS) TABS Take 1 tablet by mouth at bedtime.      [provider]  predniSONE (DELTASONE) 20 MG tablet 2 tabs poqday 1-3, 1 tabs poqday 4-6 03/17/18   Delsa Grana, PA-C  warfarin (COUMADIN) 7.5 MG tablet TAKE 1 AND 1/2 TABLET BY MOUTH MONDAY, WEDNESDAY, AND FRIDAY AND 1 TAB ON ALL OTHER DAYS 01/16/18  [name], MD    Family History Family History  Problem Relation Age of Onset  . Heart attack Father 30  . Aneurysm Mother        brain aneurysm  . Coronary artery disease Brother        with CABG  . Diabetes Brother   . Colon cancer Neg Hx   . Colon polyps Neg Hx     Social History Social History   Tobacco Use  . Smoking status: Former Smoker    Years: 5.00    Types: Cigarettes    Last attempt to quit: 08/19/1956    Years since quitting: 61.8  . Smokeless tobacco: Former Systems developer    Types: Chew  Substance Use Topics  . Alcohol use: No  . Drug use: No     Allergies   Patient has no known allergies.   Review of Systems Review of Systems  Constitutional: Negative for fever.  Eyes: Negative for photophobia.  Respiratory: Negative for shortness of breath.   Cardiovascular: Negative for chest pain.  Gastrointestinal: Negative for vomiting.  Genitourinary: Negative for bladder incontinence.  Neurological: Positive for syncope and speech difficulty. Negative for facial asymmetry and headaches.  Psychiatric/Behavioral: Positive for confusion. Negative for hallucinations and memory loss.  All other systems reviewed and are  negative.    Physical Exam Updated Vital Signs BP (!) 157/75   Pulse 63   Resp 19   SpO2 98%   Physical Exam Vitals signs and nursing note reviewed.  Constitutional:      Appearance: He is obese. He is not toxic-appearing.  HENT:     Head: Normocephalic and atraumatic.     Nose: Nose normal.     Mouth/Throat:     Mouth: Mucous membranes are moist.     Pharynx: Oropharynx is clear.  Eyes:     Extraocular Movements: Extraocular movements intact.     Conjunctiva/sclera: Conjunctivae normal.     Pupils: Pupils are equal, round, and reactive to light.  Neck:     Musculoskeletal: Normal range of motion and neck supple.  Cardiovascular:     Rate and Rhythm: Normal rate and regular rhythm.     Pulses: Normal pulses.     Heart sounds: Normal heart sounds.  Pulmonary:     Effort: Pulmonary effort is normal.     Breath sounds: Normal breath sounds.  Abdominal:     General: Abdomen is flat.     Tenderness: There is no abdominal tenderness. There is no guarding.  Musculoskeletal: Normal range of motion.  Skin:    General: Skin is warm and dry.     Capillary Refill: Capillary refill takes less than 2 seconds.  Neurological:     General: No focal deficit present.     Mental Status: He is alert.     Cranial Nerves: No cranial nerve deficit.     Deep Tendon Reflexes: Reflexes normal.  Psychiatric:        Mood and Affect: Mood normal.      ED Treatments / Results  Labs (all labs ordered are listed, but only abnormal results are displayed) Results for orders placed or performed during the hospital encounter of 05/29/18  CBC with Differential/Platelet  Result Value Ref Range   WBC 12.1 (H) 4.0 - 10.5 K/uL   RBC 4.15 (L) 4.22 - 5.81 MIL/uL   Hemoglobin 12.2 (L) 13.0 - 17.0 g/dL   HCT 38.9 (L) 39.0 - 52.0 %   MCV 93.7 80.0 -  100.0 fL   MCH 29.4 26.0 - 34.0 pg   MCHC 31.4 30.0 - 36.0 g/dL   RDW 13.9 11.5 - 15.5 %   Platelets 278 150 - 400 K/uL   nRBC 0.0 0.0 - 0.2 %    Neutrophils Relative % 71 %   Neutro Abs 8.6 (H) 1.7 - 7.7 K/uL   Lymphocytes Relative 17 %   Lymphs Abs 2.0 0.7 - 4.0 K/uL   Monocytes Relative 7 %   Monocytes Absolute 0.9 0.1 - 1.0 K/uL   Eosinophils Relative 3 %   Eosinophils Absolute 0.3 0.0 - 0.5 K/uL   Basophils Relative 1 %   Basophils Absolute 0.1 0.0 - 0.1 K/uL   Immature Granulocytes 1 %   Abs Immature Granulocytes 0.09 (H) 0.00 - 0.07 K/uL  Basic metabolic panel  Result Value Ref Range   Sodium 131 (L) 135 - 145 mmol/L   Potassium 4.9 3.5 - 5.1 mmol/L   Chloride 99 98 - 111 mmol/L   CO2 23 22 - 32 mmol/L   Glucose, Bld 122 (H) 70 - 99 mg/dL   BUN 19 8 - 23 mg/dL   Creatinine, Ser 1.17 0.61 - 1.24 mg/dL   Calcium 8.9 8.9 - 10.3 mg/dL   GFR calc non Af Amer 58 (L) >60 mL/min   GFR calc Af Amer >60 >60 mL/min   Anion gap 9 5 - 15  Urinalysis, Routine w reflex microscopic  Result Value Ref Range   Color, Urine YELLOW YELLOW   APPearance HAZY (A) CLEAR   Specific Gravity, Urine 1.024 1.005 - 1.030   pH 6.0 5.0 - 8.0   Glucose, UA NEGATIVE NEGATIVE mg/dL   Hgb urine dipstick NEGATIVE NEGATIVE   Bilirubin Urine NEGATIVE NEGATIVE   Ketones, ur NEGATIVE NEGATIVE mg/dL   Protein, ur 30 (A) NEGATIVE mg/dL   Nitrite NEGATIVE NEGATIVE   Leukocytes,Ua NEGATIVE NEGATIVE   RBC / HPF 0-5 0 - 5 RBC/hpf   WBC, UA 0-5 0 - 5 WBC/hpf   Bacteria, UA RARE (A) NONE SEEN   Mucus PRESENT    Hyaline Casts, UA PRESENT   Protime-INR  Result Value Ref Range   Prothrombin Time 18.8 (H) 11.4 - 15.2 seconds   INR 1.6 (H) 0.8 - 1.2  Ammonia  Result Value Ref Range   Ammonia 25 9 - 35 umol/L  I-stat troponin, ED  Result Value Ref Range   Troponin i, poc 0.06 0.00 - 0.08 ng/mL   Comment 3          I-STAT 7, (LYTES, BLD GAS, ICA, H+H)  Result Value Ref Range   pH, Arterial 7.422 7.350 - 7.450   pCO2 arterial 38.6 32.0 - 48.0 mmHg   pO2, Arterial 77.0 (L) 83.0 - 108.0 mmHg   Bicarbonate 25.3 20.0 - 28.0 mmol/L   TCO2 26 22 - 32  mmol/L   O2 Saturation 96.0 %   Acid-Base Excess 1.0 0.0 - 2.0 mmol/L   Sodium 132 (L) 135 - 145 mmol/L   Potassium 4.7 3.5 - 5.1 mmol/L   Calcium, Ion 1.20 1.15 - 1.40 mmol/L   HCT 35.0 (L) 39.0 - 52.0 %   Hemoglobin 11.9 (L) 13.0 - 17.0 g/dL   Patient temperature 97.9 F    Collection site RADIAL, ALLEN'S TEST ACCEPTABLE    Drawn by Operator    Sample type ARTERIAL    Dg Chest 2 View  Result Date: 05/29/2018 CLINICAL DATA:  Altered mental status EXAM: CHEST - 2  VIEW COMPARISON:  09/06/2016 FINDINGS: Shallow inspiration. Heart size and pulmonary vascularity are normal. Lungs appear clear and expanded. No blunting of costophrenic angles. No pneumothorax. Mediastinal contours appear intact. Calcified and tortuous aorta. IMPRESSION: No active cardiopulmonary disease. Electronically Signed   By: Lucienne Capers M.D.   On: 05/29/2018 23:37   Ct Head Wo Contrast  Result Date: 05/30/2018 CLINICAL DATA:  85 year old male with confusion, altered mental status. EXAM: CT HEAD WITHOUT CONTRAST TECHNIQUE: Contiguous axial images were obtained from the base of the skull through the vertex without intravenous contrast. COMPARISON:  12/20/2015 and earlier. FINDINGS: Brain: Cerebral volume is not significantly changed. Stable ventricle size and configuration. Chronic left thalamic lacune is stable. No midline shift, mass effect, evidence of mass lesion, intracranial hemorrhage or evidence of cortically based acute infarction. No cortical encephalomalacia identified. Vascular: Calcified atherosclerosis at the skull base. New calcified plaque in a right MCA branch on series 3, image 17, but no other suspicious intracranial vascular hyperdensity. Skull: No acute osseous abnormality identified. Sinuses/Orbits: Stable and pneumatized right mastoidectomy. Paranasal sinuses and left mastoids are stable and well pneumatized. Other: No acute orbit or scalp soft tissue finding. IMPRESSION: 1. No intracranial hemorrhage or  acute cortically based infarct identified. But a small focus of calcified plaque in a Right MCA branch is new since 2017. If the patient has left side weakness or other right MCA ischemic symptoms then consider acute thromboembolic disease. 2. Chronic small vessel ischemia in the left thalamus. Electronically Signed   By: Genevie Ann M.D.   On: 05/30/2018 00:04    EKG EKG Interpretation  Date/Time:  Monday May 29 2018 22:53:28 EDT Ventricular Rate:  67 PR Interval:    QRS Duration: 142 QT Interval:  440 QTC Calculation: 465 R Axis:   -38 Text Interpretation:  Sinus rhythm Left bundle branch block Confirmed by Randal Buba,  ([id number]) on 05/29/2018 11:50:12 PM   Radiology Dg Chest 2 View  Result Date: 05/29/2018 CLINICAL DATA:  Altered mental status EXAM: CHEST - 2 VIEW COMPARISON:  09/06/2016 FINDINGS: Shallow inspiration. Heart size and pulmonary vascularity are normal. Lungs appear clear and expanded. No blunting of costophrenic angles. No pneumothorax. Mediastinal contours appear intact. Calcified and tortuous aorta. IMPRESSION: No active cardiopulmonary disease. Electronically Signed   By: Lucienne Capers M.D.   On: 05/29/2018 23:37   Ct Head Wo Contrast  Result Date: 05/30/2018 CLINICAL DATA:  85 year old male with confusion, altered mental status. EXAM: CT HEAD WITHOUT CONTRAST TECHNIQUE: Contiguous axial images were obtained from the base of the skull through the vertex without intravenous contrast. COMPARISON:  12/20/2015 and earlier. FINDINGS: Brain: Cerebral volume is not significantly changed. Stable ventricle size and configuration. Chronic left thalamic lacune is stable. No midline shift, mass effect, evidence of mass lesion, intracranial hemorrhage or evidence of cortically based acute infarction. No cortical encephalomalacia identified. Vascular: Calcified atherosclerosis at the skull base. New calcified plaque in a right MCA branch on series 3, image 17, but no other suspicious  intracranial vascular hyperdensity. Skull: No acute osseous abnormality identified. Sinuses/Orbits: Stable and pneumatized right mastoidectomy. Paranasal sinuses and left mastoids are stable and well pneumatized. Other: No acute orbit or scalp soft tissue finding. IMPRESSION: 1. No intracranial hemorrhage or acute cortically based infarct identified. But a small focus of calcified plaque in a Right MCA branch is new since 2017. If the patient has left side weakness or other right MCA ischemic symptoms then consider acute thromboembolic disease. 2. Chronic small vessel ischemia in the  left thalamus. Electronically Signed   By: Genevie Ann M.D.   On: 05/30/2018 00:04    Procedures Procedures (including critical care time)  Medications Ordered in ED Medications - No data to display    Final Clinical Impressions(s) / ED Diagnoses  Will admit for AMS.  Unclear if this is delirium or seizure.      , , MD 05/30/18 [id number]

## 2018-05-31 ENCOUNTER — Other Ambulatory Visit: Payer: Self-pay

## 2018-05-31 ENCOUNTER — Inpatient Hospital Stay (HOSPITAL_COMMUNITY): Payer: Medicare Other

## 2018-05-31 DIAGNOSIS — Z7984 Long term (current) use of oral hypoglycemic drugs: Secondary | ICD-10-CM | POA: Diagnosis not present

## 2018-05-31 DIAGNOSIS — I69351 Hemiplegia and hemiparesis following cerebral infarction affecting right dominant side: Secondary | ICD-10-CM | POA: Diagnosis not present

## 2018-05-31 DIAGNOSIS — N400 Enlarged prostate without lower urinary tract symptoms: Secondary | ICD-10-CM | POA: Diagnosis present

## 2018-05-31 DIAGNOSIS — Z79899 Other long term (current) drug therapy: Secondary | ICD-10-CM | POA: Diagnosis not present

## 2018-05-31 DIAGNOSIS — Z8614 Personal history of Methicillin resistant Staphylococcus aureus infection: Secondary | ICD-10-CM | POA: Diagnosis not present

## 2018-05-31 DIAGNOSIS — Z87891 Personal history of nicotine dependence: Secondary | ICD-10-CM | POA: Diagnosis not present

## 2018-05-31 DIAGNOSIS — E039 Hypothyroidism, unspecified: Secondary | ICD-10-CM | POA: Diagnosis present

## 2018-05-31 DIAGNOSIS — F0390 Unspecified dementia without behavioral disturbance: Secondary | ICD-10-CM | POA: Diagnosis present

## 2018-05-31 DIAGNOSIS — R5381 Other malaise: Secondary | ICD-10-CM | POA: Diagnosis not present

## 2018-05-31 DIAGNOSIS — G912 (Idiopathic) normal pressure hydrocephalus: Secondary | ICD-10-CM | POA: Diagnosis present

## 2018-05-31 DIAGNOSIS — R4781 Slurred speech: Secondary | ICD-10-CM | POA: Diagnosis present

## 2018-05-31 DIAGNOSIS — R55 Syncope and collapse: Secondary | ICD-10-CM

## 2018-05-31 DIAGNOSIS — E871 Hypo-osmolality and hyponatremia: Secondary | ICD-10-CM | POA: Diagnosis not present

## 2018-05-31 DIAGNOSIS — Z8673 Personal history of transient ischemic attack (TIA), and cerebral infarction without residual deficits: Secondary | ICD-10-CM | POA: Diagnosis not present

## 2018-05-31 DIAGNOSIS — I4892 Unspecified atrial flutter: Secondary | ICD-10-CM | POA: Diagnosis present

## 2018-05-31 DIAGNOSIS — Z9861 Coronary angioplasty status: Secondary | ICD-10-CM

## 2018-05-31 DIAGNOSIS — Z8249 Family history of ischemic heart disease and other diseases of the circulatory system: Secondary | ICD-10-CM | POA: Diagnosis not present

## 2018-05-31 DIAGNOSIS — I2510 Atherosclerotic heart disease of native coronary artery without angina pectoris: Secondary | ICD-10-CM

## 2018-05-31 DIAGNOSIS — I10 Essential (primary) hypertension: Secondary | ICD-10-CM | POA: Diagnosis present

## 2018-05-31 DIAGNOSIS — I69392 Facial weakness following cerebral infarction: Secondary | ICD-10-CM | POA: Diagnosis not present

## 2018-05-31 DIAGNOSIS — G40909 Epilepsy, unspecified, not intractable, without status epilepticus: Secondary | ICD-10-CM | POA: Diagnosis not present

## 2018-05-31 DIAGNOSIS — R4182 Altered mental status, unspecified: Secondary | ICD-10-CM | POA: Diagnosis not present

## 2018-05-31 DIAGNOSIS — E1151 Type 2 diabetes mellitus with diabetic peripheral angiopathy without gangrene: Secondary | ICD-10-CM | POA: Diagnosis present

## 2018-05-31 DIAGNOSIS — Z7901 Long term (current) use of anticoagulants: Secondary | ICD-10-CM | POA: Diagnosis not present

## 2018-05-31 DIAGNOSIS — E119 Type 2 diabetes mellitus without complications: Secondary | ICD-10-CM | POA: Diagnosis present

## 2018-05-31 DIAGNOSIS — N390 Urinary tract infection, site not specified: Secondary | ICD-10-CM | POA: Diagnosis not present

## 2018-05-31 DIAGNOSIS — Z7951 Long term (current) use of inhaled steroids: Secondary | ICD-10-CM | POA: Diagnosis not present

## 2018-05-31 DIAGNOSIS — E785 Hyperlipidemia, unspecified: Secondary | ICD-10-CM | POA: Diagnosis present

## 2018-05-31 DIAGNOSIS — R531 Weakness: Secondary | ICD-10-CM

## 2018-05-31 DIAGNOSIS — R269 Unspecified abnormalities of gait and mobility: Secondary | ICD-10-CM | POA: Diagnosis not present

## 2018-05-31 DIAGNOSIS — I252 Old myocardial infarction: Secondary | ICD-10-CM | POA: Diagnosis not present

## 2018-05-31 DIAGNOSIS — I6930 Unspecified sequelae of cerebral infarction: Secondary | ICD-10-CM | POA: Diagnosis not present

## 2018-05-31 DIAGNOSIS — B9620 Unspecified Escherichia coli [E. coli] as the cause of diseases classified elsewhere: Secondary | ICD-10-CM | POA: Diagnosis present

## 2018-05-31 DIAGNOSIS — D72829 Elevated white blood cell count, unspecified: Secondary | ICD-10-CM | POA: Diagnosis present

## 2018-05-31 DIAGNOSIS — Z79890 Hormone replacement therapy (postmenopausal): Secondary | ICD-10-CM | POA: Diagnosis not present

## 2018-05-31 DIAGNOSIS — Z955 Presence of coronary angioplasty implant and graft: Secondary | ICD-10-CM | POA: Diagnosis not present

## 2018-05-31 DIAGNOSIS — I480 Paroxysmal atrial fibrillation: Secondary | ICD-10-CM | POA: Diagnosis not present

## 2018-05-31 DIAGNOSIS — Z833 Family history of diabetes mellitus: Secondary | ICD-10-CM | POA: Diagnosis not present

## 2018-05-31 LAB — BASIC METABOLIC PANEL
Anion gap: 9 (ref 5–15)
BUN: 14 mg/dL (ref 8–23)
CO2: 28 mmol/L (ref 22–32)
Calcium: 9.2 mg/dL (ref 8.9–10.3)
Chloride: 99 mmol/L (ref 98–111)
Creatinine, Ser: 0.99 mg/dL (ref 0.61–1.24)
GFR calc Af Amer: >60 mL/min (ref >60)
GFR calc non Af Amer: >60 mL/min (ref >60)
Glucose, Bld: 105 mg/dL — ABNORMAL HIGH (ref 70–99)
Potassium: 4 mmol/L (ref 3.5–5.1)
Sodium: 136 mmol/L (ref 135–145)

## 2018-05-31 LAB — CBC
HCT: 40.7 % (ref 39.0–52.0)
Hemoglobin: 13.2 g/dL (ref 13.0–17.0)
MCH: 29.6 pg (ref 26.0–34.0)
MCHC: 32.4 g/dL (ref 30.0–36.0)
MCV: 91.3 fL (ref 80.0–100.0)
Platelets: 289 10*3/uL (ref 150–400)
RBC: 4.46 MIL/uL (ref 4.22–5.81)
RDW: 13.9 % (ref 11.5–15.5)
WBC: 10.3 10*3/uL (ref 4.0–10.5)
nRBC: 0 % (ref 0.0–0.2)

## 2018-05-31 LAB — PROTIME-INR
INR: 2 — ABNORMAL HIGH (ref 0.8–1.2)
Prothrombin Time: 22.4 seconds — ABNORMAL HIGH (ref 11.4–15.2)

## 2018-05-31 LAB — GLUCOSE, CAPILLARY
Glucose-Capillary: 116 mg/dL — ABNORMAL HIGH (ref 70–99)
Glucose-Capillary: 160 mg/dL — ABNORMAL HIGH (ref 70–99)

## 2018-05-31 MED ORDER — INSULIN ASPART 100 UNIT/ML ~~LOC~~ SOLN: 0.0000 [IU] | Freq: Every day | SUBCUTANEOUS | Status: DC

## 2018-05-31 MED ORDER — HYDRALAZINE HCL 20 MG/ML IJ SOLN
5.0000 mg | Freq: Once | INTRAMUSCULAR | Status: AC
  Administered 2018-05-31: 5 mg via INTRAVENOUS
  Filled 2018-05-31: qty 1

## 2018-05-31 MED ORDER — HYDRALAZINE HCL 20 MG/ML IJ SOLN: 10.0000 mg | INTRAMUSCULAR | Status: DC | PRN

## 2018-05-31 MED ORDER — WARFARIN SODIUM 10 MG PO TABS
11.2500 mg | ORAL_TABLET | Freq: Once | ORAL | Status: AC
  Administered 2018-05-31: 11.25 mg via ORAL
  Filled 2018-05-31 (×2): qty 1

## 2018-05-31 MED ORDER — METOPROLOL TARTRATE 100 MG PO TABS
100.0000 mg | ORAL_TABLET | Freq: Two times a day (BID) | ORAL | Status: DC
  Administered 2018-05-31 – 2018-06-01 (×2): 100 mg via ORAL
  Filled 2018-05-31 (×2): qty 1

## 2018-05-31 MED ORDER — INSULIN ASPART 100 UNIT/ML ~~LOC~~ SOLN
0.0000 [IU] | Freq: Three times a day (TID) | SUBCUTANEOUS | Status: DC
  Administered 2018-06-01: 1 [IU] via SUBCUTANEOUS
  Administered 2018-06-01: 2 [IU] via SUBCUTANEOUS

## 2018-05-31 NOTE — Discharge Instructions (Addendum)
Inpatient Rehab Discharge Instructions  [name] Discharge date and time:    Activities/Precautions/ Functional Status: Activity: no lifting, driving, or strenuous exercise till cleared by MD Diet: diabetic diet Wound Care: none needed    Functional status:  ___ No restrictions     ___ Walk up steps independently _X__ 24/7 supervision/assistance   ___ Walk up steps with assistance ___ Intermittent supervision/assistance  ___ Bathe/dress independently ___ Walk with walker     ___ Bathe/dress with assistance ___ Walk Independently    ___ Shower independently ___ Walk with assistance    _X__ Shower with assistance _X__ No alcohol     ___ Return to work/school ________  Special Instructions:    My questions have been answered and I understand these instructions. I will adhere to these goals and the provided educational materials after my discharge from the hospital.  Patient/Caregiver Signature _______________________________ Date __________  Clinician Signature _______________________________________ Date __________  Please bring this form and your medication list with you to all your follow-up doctor's appointments. Information on my medicine - Coumadin   (Warfarin)  Why was Coumadin prescribed for you? Coumadin was prescribed for you because you have a blood clot or a medical condition that can cause an increased risk of forming blood clots. Blood clots can cause serious health problems by blocking the flow of blood to the heart, lung, or brain. Coumadin can prevent harmful blood clots from forming. As a reminder your indication for Coumadin is:   Stroke Prevention Because Of Atrial Fibrillation  What test will check on my response to Coumadin? While on Coumadin (warfarin) you will need to have an INR test regularly to ensure that your dose is keeping you in the desired range. The INR (international normalized ratio) number is calculated from the result of the laboratory  test called prothrombin time (PT).  If an INR APPOINTMENT HAS NOT ALREADY BEEN MADE FOR YOU please schedule an appointment to have this lab work done by your health care provider within 7 days. Your INR goal is usually a number between:  2 to 3 or your provider may give you a more narrow range like 2-2.5.  Ask your health care provider during an office visit what your goal INR is.  What  do you need to  know  About  COUMADIN? Take Coumadin (warfarin) exactly as prescribed by your healthcare provider about the same time each day.  DO NOT stop taking without talking to the doctor who prescribed the medication.  Stopping without other blood clot prevention medication to take the place of Coumadin may increase your risk of developing a new clot or stroke.  Get refills before you run out.  What do you do if you miss a dose? If you miss a dose, take it as soon as you remember on the same day then continue your regularly scheduled regimen the next day.  Do not take two doses of Coumadin at the same time.  Important Safety Information A possible side effect of Coumadin (Warfarin) is an increased risk of bleeding. You should call your healthcare provider right away if you experience any of the following: ? Bleeding from an injury or your nose that does not stop. ? Unusual colored urine (red or dark brown) or unusual colored stools (red or black). ? Unusual bruising for unknown reasons. ? A serious fall or if you hit your head (even if there is no bleeding).  Some foods or medicines interact with Coumadin (warfarin) and might  alter your response to warfarin. To help avoid this: ? Eat a balanced diet, maintaining a consistent amount of Vitamin K. ? Notify your provider about major diet changes you plan to make. ? Avoid alcohol or limit your intake to 1 drink for women and 2 drinks for men per day. (1 drink is 5 oz. wine, 12 oz. beer, or 1.5 oz. liquor.)  Make sure that ANY health care provider who  prescribes medication for you knows that you are taking Coumadin (warfarin).  Also make sure the healthcare provider who is monitoring your Coumadin knows when you have started a new medication including herbals and non-prescription products.  Coumadin (Warfarin)  Major Drug Interactions  Increased Warfarin Effect Decreased Warfarin Effect  Alcohol (large quantities) Antibiotics (esp. Septra/Bactrim, Flagyl, Cipro) Amiodarone (Cordarone) Aspirin (ASA) Cimetidine (Tagamet) Megestrol (Megace) NSAIDs (ibuprofen, naproxen, etc.) Piroxicam (Feldene) Propafenone (Rythmol SR) Propranolol (Inderal) Isoniazid (INH) Posaconazole (Noxafil) Barbiturates (Phenobarbital) Carbamazepine (Tegretol) Chlordiazepoxide (Librium) Cholestyramine (Questran) Griseofulvin Oral Contraceptives Rifampin Sucralfate (Carafate) Vitamin K   Coumadin (Warfarin) Major Herbal Interactions  Increased Warfarin Effect Decreased Warfarin Effect  Garlic Ginseng Ginkgo biloba Coenzyme Q10 Green tea St. Johns wort    Coumadin (Warfarin) FOOD Interactions  Eat a consistent number of servings per week of foods HIGH in Vitamin K (1 serving =  cup)  Collards (cooked, or boiled & drained) Kale (cooked, or boiled & drained) Mustard greens (cooked, or boiled & drained) Parsley *serving size only =  cup Spinach (cooked, or boiled & drained) Swiss chard (cooked, or boiled & drained) Turnip greens (cooked, or boiled & drained)  Eat a consistent number of servings per week of foods MEDIUM-HIGH in Vitamin K (1 serving = 1 cup)  Asparagus (cooked, or boiled & drained) Broccoli (cooked, boiled & drained, or raw & chopped) Brussel sprouts (cooked, or boiled & drained) *serving size only =  cup Lettuce, raw (green leaf, endive, romaine) Spinach, raw Turnip greens, raw & chopped   These websites have more information on Coumadin (warfarin):  FailFactory.se; VeganReport.com.au;

## 2018-05-31 NOTE — Progress Notes (Signed)
Occupational Therapy Evaluation Patient Details Name: [name] MRN: [id number] DOB: June 12, 1933 Today's Date: 05/31/2018    History of Present Illness Patient is 85 y/o male presenting to hospital with AMS and slurred speech. MRI negative for acute findings. PMH includes CAD s/p PCI, HTN, CVA, dCHF, paroxysmal afib, meniere's disease, seizures, and MI.    Clinical Impression   PTA, pt was living at home with his wife, pt reports he received some asssitance with ADLs, but vague about level of assistance and ADL requiring assistance and pt reports he was ambulating independently. PTA, pt reports previous falls stating he felt unsteady on his feed and felt his legs "gave out". Pt's family present during session and stated they feel pt has been progressively deconditioning previous 2weeks. Pt currently requires setupA for seated ADL, modA for toileting and modA for functional mobility at RW level. Due to decline in current level of function, pt would benefit from acute OT to address established goals to facilitate safe D/C home. At this time, recommend SNF follow-up. Pt and family agreeable to SNF level therapy prior to return home.      Follow Up Recommendations  SNF;Supervision/Assistance - 24 hour    Equipment Recommendations       Recommendations for Other Services       Precautions / Restrictions Precautions Precautions: Fall Restrictions Weight Bearing Restrictions: No      Mobility Bed Mobility Overal bed mobility: Needs Assistance Bed Mobility: Supine to Sit     Supine to sit: Mod assist     General bed mobility comments: modA to progress trunk to upright position sitting EOB;multimodal cues to bring hips toward EOB  Transfers Overall transfer level: Needs assistance Equipment used: Rolling walker (2 wheeled) Transfers: Sit to/from [facility] Sit to Stand: Mod assist Stand pivot transfers: Mod assist       General transfer comment: modA to  lift to stand with use of RW;vc for safe hand placement;minA to maintain standing balance with use of RW    Balance Overall balance assessment: Needs assistance Sitting-balance support: Feet supported;Bilateral upper extremity supported Sitting balance-Leahy Scale: Poor Sitting balance - Comments: required BUE and external support to maintain sitting balance   Standing balance support: Bilateral upper extremity supported Standing balance-Leahy Scale: Poor Standing balance comment: reliant on BUE and external support to maintain standing balance                           ADL either performed or assessed with clinical judgement   ADL Overall ADL's : Needs assistance/impaired Eating/Feeding: Set up;Sitting   Grooming: Set up;Sitting Grooming Details (indicate cue type and reason): pt washed/dryed face following setupA Upper Body Bathing: Set up;Sitting Upper Body Bathing Details (indicate cue type and reason): pt completed UB bathing following setupA Lower Body Bathing: Moderate assistance;Sit to/from stand   Upper Body Dressing : Set up;Sitting   Lower Body Dressing: Moderate assistance;Sit to/from stand   Toilet Transfer: Moderate assistance;Stand-pivot Toilet Transfer Details (indicate cue type and reason): stand-pivot transfer from EOB to [facility] with RW and modA Toileting- Clothing Manipulation and Hygiene: Maximal assistance;Total assistance Toileting - Clothing Manipulation Details (indicate cue type and reason): pt required maxA-totalA for pericare while standing     Functional mobility during ADLs: Moderate assistance;Rolling walker       Vision Baseline Vision/History: Wears glasses Wears Glasses: At all times Patient Visual Report: No change from baseline       Perception  Praxis      Pertinent Vitals/Pain Pain Assessment: No/denies pain     Hand Dominance Right   Extremity/Trunk Assessment Upper Extremity Assessment Upper Extremity  Assessment: Generalized weakness   Lower Extremity Assessment Lower Extremity Assessment: Defer to PT evaluation   Cervical / Trunk Assessment Cervical / Trunk Assessment: Kyphotic   Communication Communication Communication: No difficulties   Cognition Arousal/Alertness: Awake/alert Behavior During Therapy: WFL for tasks assessed/performed Overall Cognitive Status: Impaired/Different from baseline Area of Impairment: Problem solving;Safety/judgement;Following commands                       Following Commands: Follows multi-step commands inconsistently Safety/Judgement: Decreased awareness of safety   Problem Solving: Slow processing;Difficulty sequencing;Requires verbal cues;Requires tactile cues General Comments: pt required mod multimodal cues to navigate room with use of RW;required increased time for mobility and processing    General Comments  pt's 2 daughters and son-in-law present during session;educated family/pt on fall risk prevention;educated pt on appropriate use of RW;pt/family verbalized preference for SNF level therapy following d/c     Exercises     Shoulder Instructions      Home Living Family/patient expects to be discharged to:: Skilled nursing facility Living Arrangements: Spouse/significant other Available Help at Discharge: Family;Available 24 hours/day Type of Home: House Home Access: Stairs to enter [employer] of Steps: 2 Entrance Stairs-Rails: Right;Left;Can reach both Home Layout: One level     Bathroom Shower/Tub: Tub/shower unit;Walk-in shower         Home Equipment: Gilford Rile - 2 wheels;Walker - 4 wheels;Cane - single point;Shower seat   Additional Comments: family/pt verbalized preference for SNF d/c;family stated pt's wife unable to provide apporpriate level of assistance       Prior Functioning/Environment Level of [place]: Needs assistance  Gait / Transfers Assistance Needed: pt reports use of cane/walker  PRN, however family reports pt was not using AD;pt reports he feels he did not gain strength back since stroke;reports history of falling secondary to BLE weakness ADL's / Homemaking Assistance Needed: Reports needing occasional assistance for ADLs   Comments: family members report pt has been deconditioning more in previous 2 weeks        OT Problem List: Decreased strength;Decreased activity tolerance;Impaired balance (sitting and/or standing);Decreased safety awareness;Decreased knowledge of use of DME or AE      OT Treatment/Interventions: Self-care/ADL training;Therapeutic exercise;Energy conservation;DME and/or AE instruction;Therapeutic activities;Patient/family education;Balance training    OT Goals(Current goals can be found in the care plan section) Acute Rehab OT Goals Patient Stated Goal: to get stronger OT Goal Formulation: With patient/family Time For Goal Achievement: 06/14/18 Potential to Achieve Goals: Good  OT Frequency: Min 2X/week   Barriers to D/C: Decreased caregiver support  family reports pt's wife unable to provide appropriate level of support       Co-evaluation              AM-PAC OT "6 Clicks" Daily Activity     Outcome Measure Help from another person eating meals?: None Help from another person taking care of personal grooming?: A Little Help from another person toileting, which includes using toliet, bedpan, or urinal?: A Lot Help from another person bathing (including washing, rinsing, drying)?: A Little Help from another person to put on and taking off regular upper body clothing?: A Little Help from another person to put on and taking off regular lower body clothing?: A Lot 6 Click Score: 17   End of Session Equipment Utilized During Treatment:  Gait belt;Rolling walker Nurse Communication: Mobility status  Activity Tolerance: Patient tolerated treatment well Patient left: in chair;with call bell/phone within reach;with chair alarm  set;with family/visitor present  OT Visit Diagnosis: Unsteadiness on feet (R26.81);Repeated falls (R29.6);Muscle weakness (generalized) (M62.81);History of falling (Z91.81);Other abnormalities of gait and mobility (R26.89)                Time: 4171-2787 OT Time Calculation (min): 34 min Charges:  OT General Charges $OT Visit: 1 Visit OT Evaluation $OT Eval Moderate Complexity: 1 Mod OT Treatments $Self Care/Home Management : 8-22 mins  Dorinda Hill OTR/L Acute Rehabilitation Services Office: [place] 05/31/2018, 9:45 AM

## 2018-05-31 NOTE — Evaluation (Cosign Needed Addendum)
Speech Language Pathology Evaluation Patient Details Name: [name] MRN: [id number] DOB: 07-16-1933 Today's Date: 05/31/2018 Time: 0930-1010 SLP Time Calculation (min) (ACUTE ONLY): 40 min  Problem List:  Patient Active Problem List   Diagnosis Date Noted  . AMS (altered mental status) 05/30/2018  . Seizure disorder ([place]) 05/30/2018  . Fracture of proximal phalanx of finger fifth 06/17/2017 07/21/2017  . Upper airway cough syndrome 11/01/2016  . Atrial flutter ([place]) 08/05/2016  . Coronary artery disease involving native coronary artery of native heart without angina pectoris 08/05/2016  . Chronic anticoagulation 12/31/2015  . History of seizures 12/31/2015  . Acute diastolic CHF (congestive heart failure) ([place]) 12/24/2015  . Pulmonary edema   . Paroxysmal atrial fibrillation ([facility])   . Hypothyroidism   . Controlled type 2 diabetes mellitus with complication, without long-term current use of insulin ([place])   . Hypertensive emergency 12/20/2015  . Special screening for malignant neoplasms, colon 12/07/2013  . Bowel habit changes 12/07/2013  . Diarrhea 03/09/2013  . History of cardioembolic cerebrovascular accident (CVA) 01/31/2013  . Chest pain 01/29/2013  . Bilateral carotid artery disease ([place])   . Loss of coordination 12/03/2012  . Morbid obesity due to excess calories ([place]) 08/20/2011  . CAD S/P percutaneous coronary angioplasty   . Dyslipidemia   . Hypertension    Past Medical History:  Past Medical History:  Diagnosis Date  . Atrial flutter ([place])   . BPH (benign prostatic hyperplasia)   . Coronary artery disease    a. s/p MI tx with Cypher DES to pRCA in 4/04;  b. Echocardiogram 7/09: Normal LV function.  c. Nuclear study 3/13 no ischemia;  d. ETT 2/14 neg;  e. admx with CP => LHC (01/30/2013):  pLAD 40-50%, oD1 70-80%, oOM1 40%, pRCA stent patent, mid RCA 30%, EF 60-65%. => med Rx.  . DDD (degenerative disc disease)   . Diabetes mellitus without complication ([place])    . Dyslipidemia   . Hemorrhoids   . Hx MRSA infection    left buttocks abscess  . Hx of echocardiogram    a. Echocardiogram (01/31/2013): Mild focal basal and mild concentric hypertrophy of the septum, EF 50-55%, normal wall motion, grade 1 diastolic dysfunction, trivial AI, MAC, mild LAE, PASP 35  . Hyperlipidemia   . Hypertension   . Hypothyroidism   . Meniere's disease    Status post shunt  . Myocardial infarction ([place]) 2008  . Occlusion and stenosis of carotid artery without mention of cerebral infarction    40-59% on carotid doppler 2014; [place] (01/2013): R 1-39%, L 60-79%  . Rotator cuff injury    chronic rotator cuff injury status post repair  . Seizure disorder ([place])   . Stroke [place])    a. 01/2013=> post cardiac cath CVA to L post communicating artery system; R sided weakness  . Syncope    Past Surgical History:  Past Surgical History:  Procedure Laterality Date  . COLONOSCOPY    . CORONARY ANGIOPLASTY WITH STENT PLACEMENT  07/14/2002   Stent to the right coronary artery  . ENDOLYMPHATIC SHUNT DECOMPRESSION  06/11/2009    Right endolymphatic sac decompression and shunt  placement  . HERNIA REPAIR  04/10/2008   scrotal hernia repair  . LEFT HEART CATHETERIZATION WITH CORONARY ANGIOGRAM N/A 01/30/2013   Procedure: LEFT HEART CATHETERIZATION WITH CORONARY ANGIOGRAM;  Surgeon: Larey Dresser, MD;  Location[facility] CATH LAB;  Service: Cardiovascular;  Laterality: N/A;  . ROTATOR CUFF REPAIR     for chronic rotator  cuff injury   HPI:  Pt is a 85 y.o. male with PMH of atrial flutter, paroxysmal A. fib, BPH, CAD, MI, degenerative disc disease, type 2 diabetes, dyslipidemia, hemorrhoids, L buttock MRSA abscess, hyperlipidemia, HTN, hypothyroidism, Mnire's disease, rotator cuff injury, CVA,  seizure disorder, syncopal episode presenting to the ED with confusion and slurred speech. Head CT showed no intracranial hemorrhage or acute cortically based infarct. But a small focus of  calcified plaque in a Right MCA branch is new since 2017. If the patient has left side weakness or other right MCA ischemic symptoms then consider acute thromboembolic disease.   Assessment / Plan / Recommendation Clinical Impression  Pt presents with cognitive-linguistic deficits at baseline (confusion, word-finding), but family reports increased confusion, slurred speech, confabulatory speech and word-finding difficulties as of recently. He lives with his spouse and daughter lives next door. 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 Speech is slightly slurred, but overall 90% intelligible at the conversation level. When environmental noises were present (phone ringing, monitor beeping), pt displayed difficulty with selective attention and often went off the topic of conversation. He completed the Western Aphasia Battery: bedside with a score of 82 displaying difficulty with picture description, complex confrontational naming, and following complex commands. Pt also demonstrated deficits in working memory and word-finding during a functional verbal task. He would benefit from SLP services to target the above areas. Despite baseline deficits, pt's prognosis is good and he would be a good candidate for CIR.    SLP Assessment  SLP Recommendation/Assessment: Patient needs continued Speech Lanaguage Pathology Services SLP Visit Diagnosis: Cognitive communication deficit (R41.841)    Follow Up Recommendations  Inpatient Rehab    Frequency and Duration min 2x/week         SLP Evaluation Cognition  Overall Cognitive Status: History of cognitive impairments - at baseline Arousal/Alertness: Awake/alert Attention: Sustained;Selective Sustained Attention: Appears intact Selective Attention: Impaired Selective Attention Impairment: Verbal complex;Functional basic Memory: Impaired Memory Impairment: (working memory) Awareness: Catering manager Function: Sequencing Sequencing: Impaired Sequencing Impairment: Verbal complex Safety/Judgment: Marketing executive Overall Auditory Comprehension: Impaired Yes/No Questions: Impaired Complex Questions: 75-100% accurate Commands: Impaired Complex Commands: 50-74% accurate(required repetition) Conversation: Complex Interfering Components: Attention;Processing speed Visual Recognition/Discrimination Discrimination: Within Function Limits Reading Comprehension Reading Status: Not tested    Expression Expression Primary Mode of Expression: Verbal Verbal Expression Overall Verbal Expression: Impaired at baseline Initiation: No impairment Level of Generative/Spontaneous Verbalization: Conversation Repetition: No impairment Naming: Impairment Confrontation: Impaired Pragmatics: No impairment Effective Techniques: Semantic cues;Sentence completion;Phonemic cues Written Expression Dominant Hand: Right Written Expression: Not tested   Oral / Motor  Oral Motor/Sensory Function Overall Oral Motor/Sensory Function: Within functional limits Motor Speech Overall Motor Speech: Impaired Articulation: Impaired Level of Impairment: Sentence Intelligibility: Intelligibility reduced Conversation: 75-100% accurate Motor Planning: Witnin functional limits   GO                    Ellis Savage, Student SLP 05/31/2018, 12:15 PM

## 2018-05-31 NOTE — Progress Notes (Signed)
ANTICOAGULATION CONSULT NOTE - Follow Up Consult  Pharmacy Consult for Coumadin Indication: atrial fibrillation  No Known Allergies  Patient Measurements:    Vital Signs: Temp: 98.3 F (36.8 C) (03/11 0332) Temp Source: Oral (03/11 0332) BP: 149/76 (03/11 0748) Pulse Rate: 96 (03/11 0350)  Labs: Recent Labs    05/30/18 0110 05/30/18 0145 05/30/18 0326 05/31/18 0328  HGB 12.2* 11.9* 13.0 13.2  HCT 38.9* 35.0* 39.2 40.7  PLT 278  --  272 289  LABPROT 18.8*  --  18.8* 22.4*  INR 1.6*  --  1.6* 2.0*  CREATININE 1.17  --  1.13 0.99    Estimated Creatinine Clearance: 62.2 mL/min (by C-G formula based on SCr of 0.99 mg/dL).   Assessment:  Anticoag: Coumadin for Afib; current INR below goal w/ last dose taken 3/8 (missed 3/9?).   INR 1.6>2. Hgb WNL - Home dose 7.5mg  daily  except 11.25 MWF (admit INR 1.6 but missed dose 3/9)  Goal of Therapy:  INR 2-3 Monitor platelets by anticoagulation protocol: Yes   Plan:  Coumadin 11.25mg  today x 1. May be able to resume home regimen soon. Daily INR  Crystal S. Alford Highland, PharmD, BCPS Clinical Staff Pharmacist Eilene Ghazi Stillinger 05/31/2018,9:44 AM

## 2018-05-31 NOTE — Progress Notes (Signed)
Patient ID: [name], male   DOB: Oct 17, 1933, 85 y.o.   MRN: [id number]  PROGRESS NOTE    Walid Haig Bulger  QMV:[id number] DOB: [birth year] DOA: 05/29/2018 PCP: [name], MD   Brief Narrative:  85 year old male with history of atrial flutter, proximal A. fib, BPH, CAD, history of MI, degenerative disc disease, type 2 diabetes, dyslipidemia, left buttock MRSA abscess, hyperlipidemia, hypertension, hypothyroidism, Mnire's disease, stroke, seizure disorder, syncopal episode presented on 05/29/2018 with altered mental status with confusion and slurred speech with question of some jerking movement.  CT of the head did not show any acute abnormality.  Assessment & Plan:   Principal Problem:   AMS (altered mental status) Active Problems:   CAD S/P percutaneous coronary angioplasty   Dyslipidemia   Hypertension   Paroxysmal atrial fibrillation (HCC)   Hypothyroidism   Seizure disorder (HCC)   Altered mental status  Probable TIA causing altered mental status in a patient with history of CVA -CT head was negative for acute abnormality.  MRI of the brain does not show any new stroke; there is old occipital/PCA territory infarct and old basal ganglia infarcts and left thalamic infarcts -PT/OT/SLP evaluation -Patient has improved slightly but not back to his baseline as per the family.  Still confused. -EEG report is pending. -Spoke to neurology/Dr. Cheral Marker and he will see the patient in consultation -Fall precautions.  Neurochecks.  No evidence of UTI.  History of seizures -Unclear if the patient had breakthrough seizure.  EEG report is pending.  Continue Tegretol.  Tegretol level was 10 on presentation.  CAD status post PCI -Stable.  Continue statin.  Resume metoprolol  Paroxysmal A. fib -Resume metoprolol.  Continue Coumadin dose as per pharmacy.  Monitor INR  Hypothyroidism -Continue levothyroxine.  Dyslipidemia -Continue statin.  Hypertension -Continue isosorbide  mononitrate.  Restart metoprolol  Generalized deconditioning--PT eval    DVT prophylaxis: Coumadin Code Status: Full Family Communication: Spoke with daughter and son-in-law at bedside Disposition Plan: Depends on clinical outcome  Consultants: Neurology  Procedures: None  Antimicrobials: None   Subjective: Patient seen and examined at bedside.  He wakes up slightly on calling his name, does not answer most questions.  Confused.  No overnight fever, vomiting reported.  No seizure-like activity reported.  Objective: Vitals:   05/31/18 0156 05/31/18 0332 05/31/18 0350 05/31/18 0748  BP: (!) 188/111 (!) 183/84 (!) 164/77 (!) 149/76  Pulse: 87 84 96   Resp:      Temp:  98.3 F (36.8 C)    TempSrc:  Oral    SpO2:  98%  97%   No intake or output data in the 24 hours ending 05/31/18 1606 There were no vitals filed for this visit.  Examination:  General exam: Elderly male lying in bed.  No distress.  Wakes up slightly, confused, does not answer much questions.  No seizure-like activities noted.  Respiratory system: Bilateral decreased breath sounds at bases with some scattered crackles  cardiovascular system: S1 & S2 heard, Rate controlled Gastrointestinal system: Abdomen is nondistended, soft and nontender. Normal bowel sounds heard. Extremities: No cyanosis, clubbing, edema   Data Reviewed: I have personally reviewed following labs and imaging studies  CBC: Recent Labs  Lab 05/30/18 0110 05/30/18 0145 05/30/18 0326 05/31/18 0328  WBC 12.1*  --  12.5* 10.3  NEUTROABS 8.6*  --  8.5*  --   HGB 12.2* 11.9* 13.0 13.2  HCT 38.9* 35.0* 39.2 40.7  MCV 93.7  --  93.3 91.3  PLT 278  --  272 093   Basic Metabolic Panel: Recent Labs  Lab 05/30/18 0110 05/30/18 0145 05/30/18 0326 05/31/18 0328  NA 131* 132* 133* 136  K 4.9 4.7 4.8 4.0  CL 99  --  98 99  CO2 23  --  23 28  GLUCOSE 122*  --  107* 105*  BUN 19  --  21 14  CREATININE 1.17  --  1.13 0.99  CALCIUM 8.9   --  9.0 9.2   GFR: Estimated Creatinine Clearance: 62.2 mL/min (by C-G formula based on SCr of 0.99 mg/dL). Liver Function Tests: Recent Labs  Lab 05/30/18 0326  AST 19  ALT 15  ALKPHOS 71  BILITOT 0.9  PROT 6.1*  ALBUMIN 3.7   No results for input(s): LIPASE, AMYLASE in the last 168 hours. Recent Labs  Lab 05/30/18 0111  AMMONIA 25   Coagulation Profile: Recent Labs  Lab 05/30/18 0110 05/30/18 0326 05/31/18 0328  INR 1.6* 1.6* 2.0*   Cardiac Enzymes: No results for input(s): CKTOTAL, CKMB, CKMBINDEX, TROPONINI in the last 168 hours. BNP (last 3 results) No results for input(s): PROBNP in the last 8760 hours. HbA1C: Recent Labs    05/30/18 0326  HGBA1C 6.6*   CBG: Recent Labs  Lab 05/30/18 0754 05/30/18 1800  GLUCAP 97 114*   Lipid Profile: Recent Labs    05/30/18 0440  CHOL 179  HDL 50  LDLCALC 103*  TRIG 131  CHOLHDL 3.6   Thyroid Function Tests: No results for input(s): TSH, T4TOTAL, FREET4, T3FREE, THYROIDAB in the last 72 hours. Anemia Panel: No results for input(s): VITAMINB12, FOLATE, FERRITIN, TIBC, IRON, RETICCTPCT in the last 72 hours. Sepsis Labs: No results for input(s): PROCALCITON, LATICACIDVEN in the last 168 hours.  Recent Results (from the past 240 hour(s))  MRSA PCR Screening     Status: None   Collection Time: 05/30/18  6:49 PM  Result Value Ref Range Status   MRSA by PCR NEGATIVE NEGATIVE Final    Comment:        The GeneXpert MRSA Assay (FDA approved for NASAL specimens only), is one component of a comprehensive MRSA colonization surveillance program. It is not intended to diagnose MRSA infection nor to guide or monitor treatment for MRSA infections. Performed at [facility], [place] [address] [place], [place] [address]          Radiology Studies: Dg Chest 2 View  Result Date: 05/29/2018 CLINICAL DATA:  Altered mental status EXAM: CHEST - 2 VIEW COMPARISON:  09/06/2016 FINDINGS: Shallow inspiration.  Heart size and pulmonary vascularity are normal. Lungs appear clear and expanded. No blunting of costophrenic angles. No pneumothorax. Mediastinal contours appear intact. Calcified and tortuous aorta. IMPRESSION: No active cardiopulmonary disease. Electronically Signed   By: Lucienne Capers M.D.   On: 05/29/2018 23:37   Ct Head Wo Contrast  Result Date: 05/30/2018 CLINICAL DATA:  85 year old male with confusion, altered mental status. EXAM: CT HEAD WITHOUT CONTRAST TECHNIQUE: Contiguous axial images were obtained from the base of the skull through the vertex without intravenous contrast. COMPARISON:  12/20/2015 and earlier. FINDINGS: Brain: Cerebral volume is not significantly changed. Stable ventricle size and configuration. Chronic left thalamic lacune is stable. No midline shift, mass effect, evidence of mass lesion, intracranial hemorrhage or evidence of cortically based acute infarction. No cortical encephalomalacia identified. Vascular: Calcified atherosclerosis at the skull base. New calcified plaque in a right MCA branch on series 3, image 17, but no other suspicious intracranial  vascular hyperdensity. Skull: No acute osseous abnormality identified. Sinuses/Orbits: Stable and pneumatized right mastoidectomy. Paranasal sinuses and left mastoids are stable and well pneumatized. Other: No acute orbit or scalp soft tissue finding. IMPRESSION: 1. No intracranial hemorrhage or acute cortically based infarct identified. But a small focus of calcified plaque in a Right MCA branch is new since 2017. If the patient has left side weakness or other right MCA ischemic symptoms then consider acute thromboembolic disease. 2. Chronic small vessel ischemia in the left thalamus. Electronically Signed   By: Genevie Ann M.D.   On: 05/30/2018 00:04   Mr Jodene Nam Head Wo Contrast  Result Date: 05/30/2018 CLINICAL DATA:  Confusion. Worsening gait difficulties for 2 weeks. History of hypertension, hyperlipidemia, diabetes and  stroke. EXAM: MRI HEAD WITHOUT CONTRAST MRA HEAD WITHOUT CONTRAST TECHNIQUE: Multiplanar, multiecho pulse sequences of the brain and surrounding structures were obtained without intravenous contrast. Angiographic images of the head were obtained using MRA technique without contrast. COMPARISON:  CT HEAD May 29, 2018 and MRI/MRA head January 30, 2013 FINDINGS: MRI HEAD FINDINGS-sequences are mildly or moderately motion degraded. INTRACRANIAL CONTENTS: No reduced diffusion to suggest acute ischemia. No susceptibility artifact to suggest hemorrhage. No parenchymal brain volume loss for age. No hydrocephalus. Small area LEFT occipital lobe encephalomalacia. Moderate parenchymal brain volume loss, sulcal effacement at the convexities with narrowed callosum angle. Patchy supratentorial white matter FLAIR T2 hyperintensities. Old bilateral basal ganglia and LEFT thalamus lacunar infarcts. No suspicious parenchymal signal, masses, mass effect. No abnormal extra-axial fluid collections. No extra-axial masses. VASCULAR: Normal major intracranial vascular flow voids present at skull base. SKULL AND UPPER CERVICAL SPINE: No abnormal sellar expansion. No suspicious calvarial bone marrow signal. Craniocervical junction maintained. SINUSES/ORBITS: The mastoid air-cells and included paranasal sinuses are well-aerated.The included ocular globes and orbital contents are non-suspicious. Status post bilateral ocular lens implants. OTHER: None. MRA HEAD FINDINGS ANTERIOR CIRCULATION: Normal flow related enhancement of the included cervical, petrous, cavernous and supraclinoid internal carotid arteries. Patent anterior communicating artery. Patent anterior and middle cerebral arteries. No large vessel occlusion, flow limiting stenosis, or aneurysm. POSTERIOR CIRCULATION: Codominant vertebral arteries. Vertebrobasilar arteries are patent, with normal flow related enhancement of the main branch vessels. Occluded LEFT P2 segment. Patent  RIGHT posterior cerebral artery. Moderate stenosis RIGHT distal P2 segment. Small bilateral posterior communicating arteries present. No flow limiting stenosis, or aneurysm. ANATOMIC VARIANTS: Partially fenestrated distal RIGHT vertebral arteries. Source data and MIP images were reviewed. IMPRESSION: MRI HEAD: 1. Motion degraded examination.  No acute intracranial process. 2. Old LEFT occipital/PCA territory infarct. Old small basal ganglia and LEFT thalamus infarcts. 3. Moderate chronic small vessel ischemic changes. 4. 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 It has MRA HEAD: 1. No emergent large vessel occlusion or flow-limiting stenosis. 2. Chronically occluded LEFT PCA. Moderate stenosis RIGHT P2 segment. Electronically Signed   By: Elon Alas M.D.   On: 05/30/2018 06:26   [name]  Result Date: 05/30/2018 CLINICAL DATA:  Confusion. Worsening gait difficulties for 2 weeks. History of hypertension, hyperlipidemia, diabetes and stroke. EXAM: MRI HEAD WITHOUT CONTRAST MRA HEAD WITHOUT CONTRAST TECHNIQUE: Multiplanar, multiecho pulse sequences of the brain and surrounding structures were obtained without intravenous contrast. Angiographic images of the head were obtained using MRA technique without contrast. COMPARISON:  CT HEAD May 29, 2018 and MRI/MRA head January 30, 2013 FINDINGS: MRI HEAD FINDINGS-sequences are mildly or moderately motion degraded. INTRACRANIAL CONTENTS: No reduced diffusion to suggest acute ischemia. No susceptibility artifact to suggest  hemorrhage. No parenchymal brain volume loss for age. No hydrocephalus. Small area LEFT occipital lobe encephalomalacia. Moderate parenchymal brain volume loss, sulcal effacement at the convexities with narrowed callosum angle. Patchy supratentorial white matter FLAIR T2 hyperintensities. Old bilateral basal ganglia and LEFT thalamus lacunar infarcts. No suspicious parenchymal signal, masses, mass effect. No  abnormal extra-axial fluid collections. No extra-axial masses. VASCULAR: Normal major intracranial vascular flow voids present at skull base. SKULL AND UPPER CERVICAL SPINE: No abnormal sellar expansion. No suspicious calvarial bone marrow signal. Craniocervical junction maintained. SINUSES/ORBITS: The mastoid air-cells and included paranasal sinuses are well-aerated.The included ocular globes and orbital contents are non-suspicious. Status post bilateral ocular lens implants. OTHER: None. MRA HEAD FINDINGS ANTERIOR CIRCULATION: Normal flow related enhancement of the included cervical, petrous, cavernous and supraclinoid internal carotid arteries. Patent anterior communicating artery. Patent anterior and middle cerebral arteries. No large vessel occlusion, flow limiting stenosis, or aneurysm. POSTERIOR CIRCULATION: Codominant vertebral arteries. Vertebrobasilar arteries are patent, with normal flow related enhancement of the main branch vessels. Occluded LEFT P2 segment. Patent RIGHT posterior cerebral artery. Moderate stenosis RIGHT distal P2 segment. Small bilateral posterior communicating arteries present. No flow limiting stenosis, or aneurysm. ANATOMIC VARIANTS: Partially fenestrated distal RIGHT vertebral arteries. Source data and MIP images were reviewed. IMPRESSION: MRI HEAD: 1. Motion degraded examination.  No acute intracranial process. 2. Old LEFT occipital/PCA territory infarct. Old small basal ganglia and LEFT thalamus infarcts. 3. Moderate chronic small vessel ischemic changes. 4. 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 It has MRA HEAD: 1. No emergent large vessel occlusion or flow-limiting stenosis. 2. Chronically occluded LEFT PCA. Moderate stenosis RIGHT P2 segment. Electronically Signed   By: Elon Alas M.D.   On: 05/30/2018 06:26   Vas US Carotid (at [facility] [place] Only)  Result Date: 05/30/2018 Carotid Arterial Duplex Study Indications: TIA and Speech disturbance.  Performing Technologist: Maudry Mayhew MHA, RDMS, RVT, RDCS  Examination Guidelines: A complete evaluation includes B-mode imaging, spectral Doppler, color Doppler, and power Doppler as needed of all accessible portions of each vessel. Bilateral testing is considered an integral part of a complete examination. Limited examinations for reoccurring indications may be performed as noted.  Right Carotid Findings: +----------+-------+-------+--------+---------------------------------+--------+             PSV     EDV     Stenosis Describe                          Comments              cm/s    cm/s                                                         +----------+-------+-------+--------+---------------------------------+--------+  CCA Prox   154     18               irregular and heterogenous                  +----------+-------+-------+--------+---------------------------------+--------+  CCA Distal 143     31               heterogenous and calcific                   +----------+-------+-------+--------+---------------------------------+--------+  ICA Prox   173  28               irregular, heterogenous and                                                      calcific                                    +----------+-------+-------+--------+---------------------------------+--------+  ICA Distal 101     24                                                           +----------+-------+-------+--------+---------------------------------+--------+  ECA        144     14               heterogenous, calcific and                                                       irregular                                   +----------+-------+-------+--------+---------------------------------+--------+ +----------+--------+-------+----------------+-------------------+             PSV cm/s EDV cms Describe         Arm Pressure (mmHG)  +----------+--------+-------+----------------+-------------------+  Subclavian 57                Multiphasic, WNL                      +----------+--------+-------+----------------+-------------------+ +---------+--------+--+--------+--+---------+  Vertebral PSV cm/s 46 EDV cm/s 13 Antegrade  +---------+--------+--+--------+--+---------+  Left Carotid Findings: +----------+-------+-------+--------+---------------------------------+--------+             PSV     EDV     Stenosis Describe                          Comments              cm/s    cm/s                                                         +----------+-------+-------+--------+---------------------------------+--------+  CCA Prox   111     16               heterogenous and smooth                     +----------+-------+-------+--------+---------------------------------+--------+  CCA Distal 72      24               smooth and heterogenous                     +----------+-------+-------+--------+---------------------------------+--------+  ICA Prox   75      20                                                           +----------+-------+-------+--------+---------------------------------+--------+  ICA Distal 120     26                                                           +----------+-------+-------+--------+---------------------------------+--------+  ECA        97      12               heterogenous, calcific and                                                       irregular                                   +----------+-------+-------+--------+---------------------------------+--------+ +----------+--------+--------+----------------+-------------------+  Subclavian PSV cm/s EDV cm/s Describe         Arm Pressure (mmHG)  +----------+--------+--------+----------------+-------------------+             248               Multiphasic, WNL                      +----------+--------+--------+----------------+-------------------+ +---------+--------+--+--------+--+---------+  Vertebral PSV cm/s 54 EDV cm/s 11 Antegrade   +---------+--------+--+--------+--+---------+  Summary: Right Carotid: Velocities in the right ICA are consistent with a 1-39% stenosis. Left Carotid: Velocities in the left ICA are consistent with a 1-39% stenosis. Vertebrals:  Bilateral vertebral arteries demonstrate antegrade flow. Subclavians: Normal flow hemodynamics were seen in bilateral subclavian              arteries. *See table(s) above for measurements and observations.  Electronically signed by Antony Contras MD on 05/30/2018 at 5:56:53 PM.    Final         Scheduled Meds:   stroke: mapping our early stages of recovery book   Does not apply Once   atorvastatin  80 mg Oral q1800   carbamazepine  300 mg Oral BID   isosorbide mononitrate  30 mg Oral Daily   levothyroxine  75 mcg Oral Daily   metFORMIN  500 mg Oral BID WC   warfarin  11.25 mg Oral ONCE-1800   Warfarin - Pharmacist Dosing Inpatient   Does not apply q1800   Continuous Infusions:   LOS: 0 days        Aline August, MD [facility] 05/31/2018, 4:06 PM

## 2018-05-31 NOTE — Procedures (Signed)
ELECTROENCEPHALOGRAM REPORT   Patient: [name]       Room #: 8V56E EEG No. ID: [id number] Age: 85 y.o.        Sex: male Referring Physician: Starla Link Report Date:  05/31/2018        Interpreting Physician: Alexis Goodell  History: [name] is an 85 y.o. male with confusion and jerky movements  Medications:  Lipitor, Tegretol, Insulin, Imdur, Synthroid, Lopressor, Coumadin  Conditions of Recording:  This is a 21 channel routine scalp EEG performed with bipolar and monopolar montages arranged in accordance to the international 10/20 system of electrode placement. One channel was dedicated to EKG recording.  The patient is in the asleep state.  Description:  The background activity is slow and consists of a low voltage theta activity with the maximum frequency being a 6-7 Hz theta.  On rare occasions faster frequencies are noted and are most prevalent in the central regions. This activity resembles drowse.   The patient goes in to a light sleep with symmetrical sleep spindles, vertex central sharp transients and irregular slow activity.   No epileptiform activity is noted.   Hyperventilation and intermittent photic stimulation were not performed.   IMPRESSION: This EEG is characterized by slowing which is consistent with normal drowse and sleep.  Can not rule out the possibility of slowing related to general cerebral disturbance such as a metabolic encephalopathy.  Clinical correlation recommended.  No epileptiform activity is noted.     Alexis Goodell, MD Neurology [phone] 05/31/2018, 5:59 PM

## 2018-05-31 NOTE — Consult Note (Addendum)
Neurology Consultation  Reason for Consult: Altered mental status Referring Physician: Dr. Starla Link  History is obtained from: Daughter who is at bedside  HPI: [name] is a 85 y.o. male with history of syncope, stroke, seizures, rotator cuff injury, carotid stenosis, Mnire's disease, hypertension, hyperlipidemia, hemorrhoids, diabetes, atrial flutter.  Neurology was consulted due to patient initially coming in for a period of altered mental status and possible syncope.  Per family members he was sitting at the bar- at the house -when he suddenly slumped his head down and was noted to have chewing motions, eye fluttering but no body rhythmic jerking.  Per wife-she is not a very good historian-it lasted for approximately 3 to 5 minutes, at that point EMS arrived.  She states it took about 15 minutes for them to get him from the couch to the stretcher secondary to him being very weak.  While at the hospital his mentation has been waxing and waning.  It was mentioned by the nurse that he did not sleep at all last night and remained awake.  Daughter did state that while I was in the room he seemed more alert and oriented than he has been all day.  Daughter also stated that he has been having urinary incontinence for quite a while, shuffling gait for quite a while and also memory issues.  ROS: A 14 point ROS was performed and is negative except as noted in the HPI.   Past Medical History:  Diagnosis Date  . Atrial flutter ([place])   . BPH (benign prostatic hyperplasia)   . Coronary artery disease    a. s/p MI tx with Cypher DES to pRCA in 4/04;  b. Echocardiogram 7/09: Normal LV function.  c. Nuclear study 3/13 no ischemia;  d. ETT 2/14 neg;  e. admx with CP => [facility] (01/30/2013):  pLAD 40-50%, oD1 70-80%, oOM1 40%, pRCA stent patent, mid RCA 30%, EF 60-65%. => med Rx.  . DDD (degenerative disc disease)   . Diabetes mellitus without complication ([place])   . Dyslipidemia   . Hemorrhoids   . Hx MRSA  infection    left buttocks abscess  . Hx of echocardiogram    a. Echocardiogram (01/31/2013): Mild focal basal and mild concentric hypertrophy of the septum, EF 50-55%, normal wall motion, grade 1 diastolic dysfunction, trivial AI, MAC, mild LAE, PASP 35  . Hyperlipidemia   . Hypertension   . Hypothyroidism   . Meniere's disease    Status post shunt  . Myocardial infarction ([place]) 2008  . Occlusion and stenosis of carotid artery without mention of cerebral infarction    40-59% on carotid doppler 2014; [place] (01/2013): R 1-39%, L 60-79%  . Rotator cuff injury    chronic rotator cuff injury status post repair  . Seizure disorder ([place])   . Stroke [facility])    a. 01/2013=> post cardiac cath CVA to L post communicating artery system; R sided weakness  . Syncope     Family History  Problem Relation Age of Onset  . Heart attack Father 10  . Aneurysm Mother        brain aneurysm  . Coronary artery disease Brother        with CABG  . Diabetes Brother   . Colon cancer Neg Hx   . Colon polyps Neg Hx    Social History:   reports that he quit smoking about 61 years ago. His smoking use included cigarettes. He quit after 5.00 years of use. He has  quit using smokeless tobacco.  His smokeless tobacco use included chew. He reports that he does not drink alcohol or use drugs.  Medications  Current Facility-Administered Medications:  .   stroke: mapping our early stages of recovery book, , Does not apply, Once, Reubin Milan, MD .  acetaminophen (TYLENOL) tablet 650 mg, 650 mg, Oral, Q6H PRN **OR** acetaminophen (TYLENOL) suppository 650 mg, 650 mg, Rectal, Q6H PRN, Reubin Milan, MD .  atorvastatin (LIPITOR) tablet 80 mg, 80 mg, Oral, q1800, Reubin Milan, MD, 80 mg at 05/30/18 1838 .  carbamazepine (TEGRETOL XR) 12 hr tablet 300 mg, 300 mg, Oral, BID, Reubin Milan, MD, 300 mg at 05/31/18 0906 .  hydrALAZINE (APRESOLINE) injection 10 mg, 10 mg, Intravenous, Q4H PRN,  Kirby-Graham, Karsten Fells, NP .  insulin aspart (novoLOG) injection 0-5 Units, 0-5 Units, Subcutaneous, QHS, Alekh, Kshitiz, MD .  insulin aspart (novoLOG) injection 0-9 Units, 0-9 Units, Subcutaneous, TID WC, Alekh, Kshitiz, MD .  isosorbide mononitrate (IMDUR) 24 hr tablet 30 mg, 30 mg, Oral, Daily, Reubin Milan, MD, 30 mg at 05/31/18 0906 .  levothyroxine (SYNTHROID, LEVOTHROID) tablet 75 mcg, 75 mcg, Oral, Daily, [name], MD, 75 mcg at 05/31/18 0625 .  metoprolol tartrate (LOPRESSOR) tablet 100 mg, 100 mg, Oral, BID, Alekh, Kshitiz, MD .  ondansetron (ZOFRAN) tablet 4 mg, 4 mg, Oral, Q6H PRN **OR** ondansetron (ZOFRAN) injection 4 mg, 4 mg, Intravenous, Q6H PRN, Reubin Milan, MD .  warfarin (COUMADIN) tablet 11.25 mg, 11.25 mg, Oral, ONCE-1800, Karren Cobble, [place] .  Warfarin - Pharmacist Dosing Inpatient, , Does not apply, q1800, Laren Everts, RPH   Exam: Current vital signs: BP (!) 147/74 (BP Location: Right Arm)   Pulse 91   Temp 98 F (36.7 C) (Oral)   Resp 18   SpO2 96%  Vital signs in last 24 hours: Temp:  [98 F (36.7 C)-98.3 F (36.8 C)] 98 F (36.7 C) (03/11 1540) Pulse Rate:  [76-96] 91 (03/11 1540) Resp:  [15-24] 18 (03/11 1540) BP: (131-204)/(74-111) 147/74 (03/11 1540) SpO2:  [94 %-98 %] 96 % (03/11 1540)  Physical Exam  Constitutional: Appears well-developed and well-nourished.  Psych: Affect appropriate to situation Eyes: No scleral injection HENT: No OP obstrucion Head: Normocephalic.  Cardiovascular: Normal rate and regular rhythm.  Respiratory: Effort normal, non-labored breathing GI: Soft.  No distension. There is no tenderness.  Skin: WDI  Neuro: Mental Status: Patient is awake, alert, oriented to person, place, month, year, and situation. Only able to name 5 animals in one minute, not able to spell WORLD backwards, concrete thinking Able to follow three step commands Patient is not able to give a clear and coherent history.  Obtained from daughter No signs of aphasia or neglect Cranial Nerves: II: Visual Fields are full.  III,IV, VI: EOMI without ptosis or diploplia. Pupils are equal, round, and reactive to light.   V: Facial sensation is symmetric to temperature VII: Facial movement is symmetric.  VIII: hearing is intact to voice X: Uvula elevates symmetrically XI: Shoulder shrug is symmetric. XII: tongue is midline without atrophy or fasciculations.  Motor: Tone is normal. Bulk is normal. 5/5 strength was present in all four extremities.  Sensory: Sensation is symmetric to light touch and temperature in the arms and legs. Deep Tendon Reflexes: 2+ and symmetric in the biceps and patellae. No AJ Plantars: Toes are downgoing bilaterally.  Cerebellar: FNF and HKS are intact bilaterally Gait: cannot get up without 2 person  assist, magnetic gait, retropulsion, multi-step turning  Labs I have reviewed labs in epic and the results pertinent to this consultation are:   CBC    Component Value Date/Time   WBC 10.3 05/31/2018 0328   RBC 4.46 05/31/2018 0328   HGB 13.2 05/31/2018 0328   HCT 40.7 05/31/2018 0328   PLT 289 05/31/2018 0328   MCV 91.3 05/31/2018 0328   MCH 29.6 05/31/2018 0328   MCHC 32.4 05/31/2018 0328   RDW 13.9 05/31/2018 0328   LYMPHSABS 2.3 05/30/2018 0326   MONOABS 1.0 05/30/2018 0326   EOSABS 0.5 05/30/2018 0326   BASOSABS 0.1 05/30/2018 0326    CMP     Component Value Date/Time   NA 136 05/31/2018 0328   K 4.0 05/31/2018 0328   CL 99 05/31/2018 0328   CO2 28 05/31/2018 0328   GLUCOSE 105 (H) 05/31/2018 0328   BUN 14 05/31/2018 0328   CREATININE 0.99 05/31/2018 0328   CREATININE 1.24 (H) 06/10/2016 1229   CALCIUM 9.2 05/31/2018 0328   PROT 6.1 (L) 05/30/2018 0326   ALBUMIN 3.7 05/30/2018 0326   AST 19 05/30/2018 0326   ALT 15 05/30/2018 0326   ALKPHOS 71 05/30/2018 0326   BILITOT 0.9 05/30/2018 0326   GFRNONAA >60 05/31/2018 0328   GFRNONAA 55 (L) 06/10/2016 1229    GFRAA >60 05/31/2018 0328   GFRAA 63 06/10/2016 1229    Lipid Panel     Component Value Date/Time   CHOL 179 05/30/2018 0440   TRIG 131 05/30/2018 0440   HDL 50 05/30/2018 0440   CHOLHDL 3.6 05/30/2018 0440   VLDL 26 05/30/2018 0440   LDLCALC 103 (H) 05/30/2018 0440     Imaging I have reviewed the images obtained:  CT-scan of the brain--No intracranial hemorrhage or acute cortically based infarct Identified. But a small focus of calcified plaque in a Right MCA branch is new since 2017.   Etta Quill PA-C [facility] [phone] 05/31/2018, 5:21 PM     Assessment:  85 year old male with progressive decline in mentation and gait with incontinence. Presented with what was thought to be a possible seizure, but with semiology that is also suggestive of a cognitive fluctuation or hypotensive spell.  1. While in the hospital he has had waxing and waning mentation.   2. MRI reviewed. There is hyperintense signal along the ependymal surfaces of almost the entire boundary of the lateral ventricles, as well as ventricular widening that is disproportionate to widening of the cortical sulci. Given these findings, his magnetic gait, progressive dementia symptoms and urinary incontinence, normal pressure hydrocephalus (NPH) is felt to be highest on the DDx.  3. The MRI findings are not consistent with vascular dementia. Hippocampal atrophy also noted, therefore Alzheimer's dementia is possible.      Recommendations: - Long discussion was had with the daughter about his progressive decline.  It was recommended that she go to [facility] in [place] to their normal pressure hydrocephalus clinic to have an subspecialist in NPH evaluate the patient.  It was also recommended afterwards to have patient be seen by Dr. Posey Pronto of [facility] for regular follow up visits. - Family was informed that he should not drive.  -- Orthostatics -- EEG  Addendum:  EEG  completed. The EEG is characterized by slowing which is consistent with normal drowse and sleep.  Cannot rule out the possibility of slowing related to general cerebral disturbance such as a metabolic encephalopathy. No epileptiform activity is noted.   -  Neurology will sign off. Please call if there are any additional questions.   I have seen and examined the patient. I have formulated the assessment and plan.  Electronically signed: Dr. Kerney Elbe

## 2018-05-31 NOTE — Progress Notes (Signed)
Inpatient Rehabilitation Admissions Coordinator   Inpatient Rehab Consult received. I await Neurology consult before assessment of patient and discussion with family of his rehab venue options. I will follow up tomorrow.  Danne Baxter, RN, MSN Rehab Admissions Coordinator [phone] 05/31/2018 4:20 PM

## 2018-05-31 NOTE — Progress Notes (Signed)
EEG Completed; Results Pending  

## 2018-06-01 ENCOUNTER — Inpatient Hospital Stay (HOSPITAL_COMMUNITY)
Admission: RE | Admit: 2018-06-01 | Discharge: 2018-06-15 | DRG: 945 | Disposition: A | Discharge status: Home Health | Payer: Medicare Other | Source: Intra-hospital | Attending: Physical Medicine & Rehabilitation | Admitting: Physical Medicine & Rehabilitation

## 2018-06-01 ENCOUNTER — Other Ambulatory Visit: Payer: Self-pay

## 2018-06-01 ENCOUNTER — Encounter (HOSPITAL_COMMUNITY): Payer: Self-pay | Admitting: Nurse Practitioner

## 2018-06-01 DIAGNOSIS — Z79899 Other long term (current) drug therapy: Secondary | ICD-10-CM

## 2018-06-01 DIAGNOSIS — E118 Type 2 diabetes mellitus with unspecified complications: Secondary | ICD-10-CM | POA: Diagnosis present

## 2018-06-01 DIAGNOSIS — E039 Hypothyroidism, unspecified: Secondary | ICD-10-CM | POA: Diagnosis present

## 2018-06-01 DIAGNOSIS — I480 Paroxysmal atrial fibrillation: Secondary | ICD-10-CM | POA: Diagnosis present

## 2018-06-01 DIAGNOSIS — G40909 Epilepsy, unspecified, not intractable, without status epilepticus: Secondary | ICD-10-CM | POA: Diagnosis present

## 2018-06-01 DIAGNOSIS — Z7984 Long term (current) use of oral hypoglycemic drugs: Secondary | ICD-10-CM

## 2018-06-01 DIAGNOSIS — I2510 Atherosclerotic heart disease of native coronary artery without angina pectoris: Secondary | ICD-10-CM | POA: Diagnosis present

## 2018-06-01 DIAGNOSIS — Z79890 Hormone replacement therapy (postmenopausal): Secondary | ICD-10-CM | POA: Diagnosis not present

## 2018-06-01 DIAGNOSIS — Z833 Family history of diabetes mellitus: Secondary | ICD-10-CM

## 2018-06-01 DIAGNOSIS — Z8249 Family history of ischemic heart disease and other diseases of the circulatory system: Secondary | ICD-10-CM | POA: Diagnosis not present

## 2018-06-01 DIAGNOSIS — R269 Unspecified abnormalities of gait and mobility: Secondary | ICD-10-CM

## 2018-06-01 DIAGNOSIS — N400 Enlarged prostate without lower urinary tract symptoms: Secondary | ICD-10-CM | POA: Diagnosis present

## 2018-06-01 DIAGNOSIS — Z955 Presence of coronary angioplasty implant and graft: Secondary | ICD-10-CM

## 2018-06-01 DIAGNOSIS — D72829 Elevated white blood cell count, unspecified: Secondary | ICD-10-CM | POA: Diagnosis present

## 2018-06-01 DIAGNOSIS — Z7901 Long term (current) use of anticoagulants: Secondary | ICD-10-CM

## 2018-06-01 DIAGNOSIS — G912 (Idiopathic) normal pressure hydrocephalus: Secondary | ICD-10-CM | POA: Diagnosis present

## 2018-06-01 DIAGNOSIS — E119 Type 2 diabetes mellitus without complications: Secondary | ICD-10-CM | POA: Diagnosis not present

## 2018-06-01 DIAGNOSIS — I10 Essential (primary) hypertension: Secondary | ICD-10-CM | POA: Diagnosis not present

## 2018-06-01 DIAGNOSIS — Z8673 Personal history of transient ischemic attack (TIA), and cerebral infarction without residual deficits: Secondary | ICD-10-CM

## 2018-06-01 DIAGNOSIS — I252 Old myocardial infarction: Secondary | ICD-10-CM | POA: Diagnosis not present

## 2018-06-01 DIAGNOSIS — I69351 Hemiplegia and hemiparesis following cerebral infarction affecting right dominant side: Secondary | ICD-10-CM

## 2018-06-01 DIAGNOSIS — E785 Hyperlipidemia, unspecified: Secondary | ICD-10-CM | POA: Diagnosis present

## 2018-06-01 DIAGNOSIS — E871 Hypo-osmolality and hyponatremia: Secondary | ICD-10-CM | POA: Diagnosis not present

## 2018-06-01 DIAGNOSIS — R5381 Other malaise: Principal | ICD-10-CM | POA: Diagnosis present

## 2018-06-01 DIAGNOSIS — E1151 Type 2 diabetes mellitus with diabetic peripheral angiopathy without gangrene: Secondary | ICD-10-CM | POA: Diagnosis present

## 2018-06-01 DIAGNOSIS — N390 Urinary tract infection, site not specified: Secondary | ICD-10-CM | POA: Diagnosis not present

## 2018-06-01 DIAGNOSIS — B9620 Unspecified Escherichia coli [E. coli] as the cause of diseases classified elsewhere: Secondary | ICD-10-CM

## 2018-06-01 DIAGNOSIS — Z87891 Personal history of nicotine dependence: Secondary | ICD-10-CM

## 2018-06-01 DIAGNOSIS — I69392 Facial weakness following cerebral infarction: Secondary | ICD-10-CM

## 2018-06-01 LAB — COMPREHENSIVE METABOLIC PANEL
ALT: 16 U/L (ref 0–44)
AST: 20 U/L (ref 15–41)
Albumin: 3.6 g/dL (ref 3.5–5.0)
Alkaline Phosphatase: 71 U/L (ref 38–126)
Anion gap: 9 (ref 5–15)
BUN: 20 mg/dL (ref 8–23)
CO2: 26 mmol/L (ref 22–32)
Calcium: 9.2 mg/dL (ref 8.9–10.3)
Chloride: 98 mmol/L (ref 98–111)
Creatinine, Ser: 1.19 mg/dL (ref 0.61–1.24)
GFR calc Af Amer: >60 mL/min (ref >60)
GFR calc non Af Amer: 57 mL/min — ABNORMAL LOW (ref >60)
Glucose, Bld: 121 mg/dL — ABNORMAL HIGH (ref 70–99)
Potassium: 4 mmol/L (ref 3.5–5.1)
Sodium: 133 mmol/L — ABNORMAL LOW (ref 135–145)
Total Bilirubin: 0.4 mg/dL (ref 0.3–1.2)
Total Protein: 6.4 g/dL — ABNORMAL LOW (ref 6.5–8.1)

## 2018-06-01 LAB — CBC WITH DIFFERENTIAL/PLATELET
Abs Immature Granulocytes: 0.11 10*3/uL — ABNORMAL HIGH (ref 0.00–0.07)
Basophils Absolute: 0.1 10*3/uL (ref 0.0–0.1)
Basophils Relative: 1 %
Eosinophils Absolute: 0.5 10*3/uL (ref 0.0–0.5)
Eosinophils Relative: 4 %
HCT: 40.2 % (ref 39.0–52.0)
Hemoglobin: 13 g/dL (ref 13.0–17.0)
Immature Granulocytes: 1 %
Lymphocytes Relative: 18 %
Lymphs Abs: 2.1 10*3/uL (ref 0.7–4.0)
MCH: 29.5 pg (ref 26.0–34.0)
MCHC: 32.3 g/dL (ref 30.0–36.0)
MCV: 91.4 fL (ref 80.0–100.0)
Monocytes Absolute: 1.3 10*3/uL — ABNORMAL HIGH (ref 0.1–1.0)
Monocytes Relative: 11 %
Neutro Abs: 7.6 10*3/uL (ref 1.7–7.7)
Neutrophils Relative %: 65 %
Platelets: 315 10*3/uL (ref 150–400)
RBC: 4.4 MIL/uL (ref 4.22–5.81)
RDW: 14.6 % (ref 11.5–15.5)
WBC: 11.6 10*3/uL — ABNORMAL HIGH (ref 4.0–10.5)
nRBC: 0 % (ref 0.0–0.2)

## 2018-06-01 LAB — GLUCOSE, CAPILLARY
Glucose-Capillary: 124 mg/dL — ABNORMAL HIGH (ref 70–99)
Glucose-Capillary: 166 mg/dL — ABNORMAL HIGH (ref 70–99)
Glucose-Capillary: 156 mg/dL — ABNORMAL HIGH (ref 70–99)
Glucose-Capillary: 122 mg/dL — ABNORMAL HIGH (ref 70–99)

## 2018-06-01 LAB — URINALYSIS, ROUTINE W REFLEX MICROSCOPIC
Bilirubin Urine: NEGATIVE
Glucose, UA: NEGATIVE mg/dL
Ketones, ur: NEGATIVE mg/dL
Leukocytes,Ua: NEGATIVE
Nitrite: NEGATIVE
Protein, ur: NEGATIVE mg/dL
Specific Gravity, Urine: 1.025 (ref 1.005–1.030)
pH: 5.5 (ref 5.0–8.0)

## 2018-06-01 LAB — URINALYSIS, MICROSCOPIC (REFLEX): Bacteria, UA: NONE SEEN

## 2018-06-01 LAB — PROTIME-INR
INR: 3.6 — ABNORMAL HIGH (ref 0.8–1.2)
Prothrombin Time: 35.3 seconds — ABNORMAL HIGH (ref 11.4–15.2)

## 2018-06-01 LAB — MAGNESIUM: Magnesium: 1.9 mg/dL (ref 1.7–2.4)

## 2018-06-01 LAB — CARBAMAZEPINE LEVEL, TOTAL: Carbamazepine Lvl: 9.2 ug/mL (ref 4.0–12.0)

## 2018-06-01 LAB — HEMOGLOBIN A1C
Hgb A1c MFr Bld: 6.7 % — ABNORMAL HIGH (ref 4.8–5.6)
Mean Plasma Glucose: 145.59 mg/dL

## 2018-06-01 LAB — TSH: TSH: 3.023 u[IU]/mL (ref 0.350–4.500)

## 2018-06-01 LAB — VITAMIN B12: Vitamin B-12: 591 pg/mL (ref 180–914)

## 2018-06-01 MED ORDER — POLYETHYLENE GLYCOL 3350 17 G PO PACK
17.0000 g | PACK | Freq: Every day | ORAL | Status: DC | PRN
  Administered 2018-06-03: 17 g via ORAL
  Filled 2018-06-01: qty 1

## 2018-06-01 MED ORDER — ONDANSETRON HCL 4 MG/2ML IJ SOLN: 4.0000 mg | Freq: Four times a day (QID) | INTRAMUSCULAR | Status: DC | PRN

## 2018-06-01 MED ORDER — FLEET ENEMA 7-19 GM/118ML RE ENEM: 1.0000 | ENEMA | Freq: Once | RECTAL | Status: DC | PRN

## 2018-06-01 MED ORDER — WARFARIN - PHARMACIST DOSING INPATIENT
Freq: Every day | Status: DC
  Administered 2018-06-03 – 2018-06-13 (×6)

## 2018-06-01 MED ORDER — ATORVASTATIN CALCIUM 80 MG PO TABS
80.0000 mg | ORAL_TABLET | Freq: Every day | ORAL | Status: DC
  Administered 2018-06-01 – 2018-06-14 (×14): 80 mg via ORAL
  Filled 2018-06-01 (×14): qty 1

## 2018-06-01 MED ORDER — METOPROLOL TARTRATE 50 MG PO TABS
100.0000 mg | ORAL_TABLET | Freq: Two times a day (BID) | ORAL | Status: DC
  Administered 2018-06-01 – 2018-06-15 (×28): 100 mg via ORAL
  Filled 2018-06-01 (×29): qty 2

## 2018-06-01 MED ORDER — ALUM & MAG HYDROXIDE-SIMETH 200-200-20 MG/5ML PO SUSP: 30.0000 mL | ORAL | Status: DC | PRN

## 2018-06-01 MED ORDER — INSULIN ASPART 100 UNIT/ML ~~LOC~~ SOLN
0.0000 [IU] | Freq: Three times a day (TID) | SUBCUTANEOUS | Status: DC
  Administered 2018-06-01: 2 [IU] via SUBCUTANEOUS
  Administered 2018-06-02 – 2018-06-03 (×4): 1 [IU] via SUBCUTANEOUS
  Administered 2018-06-04: 2 [IU] via SUBCUTANEOUS
  Administered 2018-06-04: 1 [IU] via SUBCUTANEOUS
  Administered 2018-06-05: 2 [IU] via SUBCUTANEOUS
  Administered 2018-06-05: 1 [IU] via SUBCUTANEOUS
  Administered 2018-06-06: 2 [IU] via SUBCUTANEOUS
  Administered 2018-06-07 (×2): 1 [IU] via SUBCUTANEOUS
  Administered 2018-06-08: 2 [IU] via SUBCUTANEOUS
  Administered 2018-06-10 – 2018-06-12 (×3): 1 [IU] via SUBCUTANEOUS

## 2018-06-01 MED ORDER — FUROSEMIDE 20 MG PO TABS: 20.0000 mg | ORAL_TABLET | Freq: Every day | ORAL | Status: DC

## 2018-06-01 MED ORDER — TRAZODONE HCL 50 MG PO TABS: 25.0000 mg | ORAL_TABLET | Freq: Every evening | ORAL | Status: DC | PRN

## 2018-06-01 MED ORDER — CARBAMAZEPINE ER 200 MG PO TB12
300.0000 mg | ORAL_TABLET | Freq: Two times a day (BID) | ORAL | Status: DC
  Administered 2018-06-01 – 2018-06-15 (×28): 300 mg via ORAL
  Filled 2018-06-01 (×29): qty 1

## 2018-06-01 MED ORDER — PROCHLORPERAZINE EDISYLATE 10 MG/2ML IJ SOLN: 5.0000 mg | Freq: Four times a day (QID) | INTRAMUSCULAR | Status: DC | PRN

## 2018-06-01 MED ORDER — BISACODYL 10 MG RE SUPP
10.0000 mg | Freq: Every day | RECTAL | Status: DC | PRN
  Administered 2018-06-04: 10 mg via RECTAL
  Filled 2018-06-01: qty 1

## 2018-06-01 MED ORDER — INSULIN ASPART 100 UNIT/ML ~~LOC~~ SOLN
0.0000 [IU] | Freq: Every day | SUBCUTANEOUS | Status: DC
  Administered 2018-06-06: 2 [IU] via SUBCUTANEOUS

## 2018-06-01 MED ORDER — GUAIFENESIN-DM 100-10 MG/5ML PO SYRP: 5.0000 mL | ORAL_SOLUTION | Freq: Four times a day (QID) | ORAL | Status: DC | PRN

## 2018-06-01 MED ORDER — ALBUTEROL SULFATE HFA 108 (90 BASE) MCG/ACT IN AERS: 2.0000 | INHALATION_SPRAY | RESPIRATORY_TRACT | Status: DC | PRN

## 2018-06-01 MED ORDER — LEVOTHYROXINE SODIUM 75 MCG PO TABS
75.0000 ug | ORAL_TABLET | Freq: Every day | ORAL | Status: DC
  Administered 2018-06-02 – 2018-06-15 (×14): 75 ug via ORAL
  Filled 2018-06-01 (×14): qty 1

## 2018-06-01 MED ORDER — ATORVASTATIN CALCIUM 80 MG PO TABS: 80.0000 mg | ORAL_TABLET | Freq: Every day | ORAL | Status: DC

## 2018-06-01 MED ORDER — ONDANSETRON HCL 4 MG PO TABS: 4.0000 mg | ORAL_TABLET | Freq: Four times a day (QID) | ORAL | Status: DC | PRN

## 2018-06-01 MED ORDER — ACETAMINOPHEN 325 MG PO TABS
325.0000 mg | ORAL_TABLET | ORAL | Status: DC | PRN
  Administered 2018-06-04: 650 mg via ORAL
  Filled 2018-06-01 (×4): qty 2

## 2018-06-01 MED ORDER — ALBUTEROL SULFATE (2.5 MG/3ML) 0.083% IN NEBU: 2.5000 mg | INHALATION_SOLUTION | RESPIRATORY_TRACT | Status: DC | PRN

## 2018-06-01 MED ORDER — ISOSORBIDE MONONITRATE ER 30 MG PO TB24
30.0000 mg | ORAL_TABLET | Freq: Every day | ORAL | Status: DC
  Administered 2018-06-02 – 2018-06-15 (×14): 30 mg via ORAL
  Filled 2018-06-01 (×14): qty 1

## 2018-06-01 MED ORDER — PROCHLORPERAZINE 25 MG RE SUPP: 12.5000 mg | Freq: Four times a day (QID) | RECTAL | Status: DC | PRN

## 2018-06-01 MED ORDER — PROCHLORPERAZINE MALEATE 5 MG PO TABS: 5.0000 mg | ORAL_TABLET | Freq: Four times a day (QID) | ORAL | Status: DC | PRN

## 2018-06-01 MED ORDER — DIPHENHYDRAMINE HCL 12.5 MG/5ML PO ELIX: 12.5000 mg | ORAL_SOLUTION | Freq: Four times a day (QID) | ORAL | Status: DC | PRN

## 2018-06-01 NOTE — Progress Notes (Signed)
Patient arrived to floor with personal belongings. Patient oriented to room and call light in place.  Nicholes Rough, LPN

## 2018-06-01 NOTE — Progress Notes (Signed)
Daniel Blake, MD  Physician  Physical Medicine and Rehabilitation  PMR Pre-admission  Signed  Date of Service:  06/01/2018 1:25 PM       Related encounter: ED to Hosp-Admission (Discharged) from 05/29/2018 in [facility]      Signed         Show:Clear all _0 Manual_1 Template_2 Copied  Added by: _3 Cristina Gong, RN_4 Daniel Blake, MD  _5 Hover for details  PMR Admission Coordinator Pre-Admission Assessment  Patient: [name] is an 85 y.o., male MRN: [id number] DOB: [birth year] Height:   Weight:    Insurance Information HMO:     PPO:      PCP:      IPA:      80/20:      OTHER: no HMO PRIMARY: Medicare a and b      Policy#: [id number]      Subscriber: pt Benefits:  Phone #: passport one online     Name: 06/01/2018 Eff. Date: 06/20/2000     Deduct: $1408      Out of Pocket Max: none      Life Max: none CIR: 100%      SNF: 20 full days Outpatient: 80%     Co-Pay: 20% Home Health: 100%      Co-Pay: none DME: 80%     Co-Pay: 20% Providers: pt choice  SECONDARY: [employer]      Policy#: [id number]      Subscriber: pt  Medicaid Application Date:       Case Manager:  Disability Application Date:       Case Worker:   Emergency Contact Information         Contact Information    Name Relation Home Work Mobile   Kasey,Kim Daughter   [phone]   Cayleb, Jarnigan   [phone]   Idelle Jo Daughter   [phone]      Current Medical History  Patient Admitting Diagnosis: debility; possible NPH  History of Present Illness: [name] is an85 year old male with history of CVA with residual R-HP and facial droop,seizures, CAS, T2DM, A flutter-Coumadin, CAD, Meniere's disease who was admitted via ED on 03/10/20with altered mental status changes with syncope as well as abnormal chewing movements with eye fluttering. Episode lasted approximately 2 to 5 minutes. Family reported issues with urinary  incontinence,shuffling gaitwith fallsandprogressive cognitive decline.CT head negativefor acute changes.  He was treated with IVF and tegretol levels WNL - 10. He continued to have waxing and waning of symptoms with ongoing confusion. Dr. Cheral Marker consulted due to concerns of seizure activity and question orthostatic symptoms. EEG was negative for epileptiform activity. MRI/MRA brain done revealing old left occipital/PCA territory infarct, chronic occluded L-PCA old basal ganglia and left thalamic infarcts, moderate small vessel disease and findings of mild chronic communicating hydrocephalus. He recommended follow up with neurology past discharge as well as NPH clinic at [facility] due to possibility of NPH. Patient with cognitive deficits with confabulatory speech, word finding deficits, poor sitting and standing balance as well as deficits in processing affecting ADLs and mobility.   Patient's medical record from [facility] has been reviewed by the rehabilitation admission coordinator and physician.   Past Medical History      Past Medical History:  Diagnosis Date  . Atrial flutter ([place])   . BPH (benign prostatic hyperplasia)   . Coronary artery disease    a. s/p MI tx with Cypher DES to pRCA in 4/04;  b. Echocardiogram 7/09: Normal LV  function.  c. Nuclear study 3/13 no ischemia;  d. ETT 2/14 neg;  e. admx with CP => [facility] (01/30/2013):  pLAD 40-50%, oD1 70-80%, oOM1 40%, pRCA stent patent, mid RCA 30%, EF 60-65%. => med Rx.  . DDD (degenerative disc disease)   . Diabetes mellitus without complication ([place])   . Dyslipidemia   . Hemorrhoids   . Hx MRSA infection    left buttocks abscess  . Hx of echocardiogram    a. Echocardiogram (01/31/2013): Mild focal basal and mild concentric hypertrophy of the septum, EF 50-55%, normal wall motion, grade 1 diastolic dysfunction, trivial AI, MAC, mild LAE, PASP 35  . Hyperlipidemia   . Hypertension   . Hypothyroidism   .  Meniere's disease    Status post shunt  . Myocardial infarction ([place]) 2008  . Occlusion and stenosis of carotid artery without mention of cerebral infarction    40-59% on carotid doppler 2014; [place] (01/2013): R 1-39%, L 60-79%  . Rotator cuff injury    chronic rotator cuff injury status post repair  . Seizure disorder ([place])   . Stroke [facility])    a. 01/2013=> post cardiac cath CVA to L post communicating artery system; R sided weakness  . Syncope     Family History   family history includes Aneurysm in his mother; Coronary artery disease in his brother; Diabetes in his brother; Heart attack (age of onset: 101) in his father.  Prior Rehab/Hospitalizations Has the patient had major surgery during 100 days prior to admission? No CIR 2014 with CVA Dr. Naaman Plummer team for 12 days and discharged home at supervision level  Current Medications  Current Facility-Administered Medications:  .   stroke: mapping our early stages of recovery book, , Does not apply, Once, Reubin Milan, MD .  acetaminophen (TYLENOL) tablet 650 mg, 650 mg, Oral, Q6H PRN **OR** acetaminophen (TYLENOL) suppository 650 mg, 650 mg, Rectal, Q6H PRN, Reubin Milan, MD .  atorvastatin (LIPITOR) tablet 80 mg, 80 mg, Oral, q1800, Reubin Milan, MD, 80 mg at 05/31/18 1802 .  carbamazepine (TEGRETOL XR) 12 hr tablet 300 mg, 300 mg, Oral, BID, Reubin Milan, MD, 300 mg at 06/01/18 3254 .  hydrALAZINE (APRESOLINE) injection 10 mg, 10 mg, Intravenous, Q4H PRN, Kirby-Graham, Karsten Fells, NP .  insulin aspart (novoLOG) injection 0-5 Units, 0-5 Units, Subcutaneous, QHS, Alekh, Kshitiz, MD .  insulin aspart (novoLOG) injection 0-9 Units, 0-9 Units, Subcutaneous, TID WC, Starla Link, Kshitiz, MD, 2 Units at 06/01/18 1232 .  isosorbide mononitrate (IMDUR) 24 hr tablet 30 mg, 30 mg, Oral, Daily, Reubin Milan, MD, 30 mg at 06/01/18 0949 .  levothyroxine (SYNTHROID, LEVOTHROID) tablet 75 mcg, 75 mcg, Oral, Daily,  [name], MD, 75 mcg at 06/01/18 0601 .  metoprolol tartrate (LOPRESSOR) tablet 100 mg, 100 mg, Oral, BID, Starla Link, Kshitiz, MD, 100 mg at 06/01/18 0949 .  ondansetron (ZOFRAN) tablet 4 mg, 4 mg, Oral, Q6H PRN **OR** ondansetron (ZOFRAN) injection 4 mg, 4 mg, Intravenous, Q6H PRN, Reubin Milan, MD .  Warfarin - Pharmacist Dosing Inpatient, , Does not apply, q1800, Laren Everts, RPH  Patients Current Diet:      Diet Order                  Diet - low sodium heart healthy         Diet Carb Modified         Diet Heart Room service appropriate? 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 Self Care: Did the patient need help bathing, dressing, using the toilet or eating?  Independent  Indoor Mobility: Did the patient need assistance with walking from room to room (with or without device)? Independent  Stairs: Did the patient need assistance with internal or external stairs (with or without device)? Independent  Functional Cognition: Did the patient need help planning regular tasks such as  shopping or remembering to take medications? [place] / [place] Devices/Equipment: Gilford Rile (specify type) Home Equipment: Walker - 2 wheels, Walker - 4 wheels, Cane - single point, Shower seat  Prior Device Use: Indicate devices/aids used by the patient prior to current illness, exacerbation or injury? None of the above  Prior Functional Level Vocation: Retired Comments: family members report pt has been deconditioning more in previous 2 weeks   Prior Functional Level Current Functional Level  Bed Mobility  Independent Min assist for trunk control and to maintain sitting balance at EOB.  Transfers Independent Mod assist  Mobility - Walk/Wheelchair Independent  mod assist  Mobility - Ambulation/Gait Independent Mod assist, +2 physical assistance side steps at EOB with RW. patient difficulty with sequencing and required verbal and tactile cues to take steps  Upper Body Dressing Independent Set up, Sitting  Lower Body Dressing Independent Moderate assistance, Sit to/from stand  Grooming Independent Set up, Sitting  Eating/Drinking Independent Set up, Sitting  Toilet Transfer Independent Moderate assistance, Stand-pivot  Bladder Continence Continent until 2 weeks ago  incontinent  Bowel Management  continent   continent  Stair Climbing independent   not attempted  Communication independent  confused  Memory intact   not at baseline  Cooking/Meal Prep  independent     Housework  independent   Money Management  independent   Driving  yes    Was independent until 2 weeks ago with now decline in function per daughter, Maudie Mercury.  Special needs/care consideration BiPAP/CPAP n/a CPM n/a Continuous Drip IV n/a Dialysis n/a Life Vest n/a Oxygen n/a Special Bed fall precautions needed Trach Size n/a Wound Vac n/a Skin dry, abrasion and ecchymosis to right elbow, back to the left; MASD to buttocks and perineal area Bowel mgmt:  continent LBM 3/10 Bladder mgmt: incontinent Diabetic mgmt n/a  Previous Home Environment Living Arrangements: Spouse/significant other  Lives With: Spouse Available Help at Discharge: Family, Available 24 hours/day Type of Home: House Home Layout: One level Home Access: Stairs to enter Entrance Stairs-Rails: Right, Left, Can reach both Entrance Stairs-Number of Steps: 2 Bathroom Shower/Tub: Tub/shower unit, Multimedia programmer: Standard Bathroom Accessibility: Yes How Accessible: Accessible via walker Home Care Services: No Additional Comments:  wife with new dx dementia, Maudie Mercury , daughter prefers CIR  Discharge Living Setting Plans for Discharge Living Setting: Patient's home, Lives with (comment)(spouse) Type of Home at Discharge: House Discharge Home Layout: One level Discharge Home Access: Stairs to enter Entrance Stairs-Rails: Right, Left, Can reach both Entrance Stairs-Number of Steps: 2 Discharge Bathroom Shower/Tub: Tub/shower unit Discharge Bathroom Toilet: Standard Discharge Bathroom Accessibility: Yes How Accessible: Accessible via walker Does the patient have any problems obtaining your medications?: No  Social/Family/Support Systems Patient Roles: Spouse, Parent(retired preacher) Contact Information: York Ram, daughter is [employer] Anticipated Caregiver: daughter, Maudie Mercury nad other granddaughters will araange 24/7 supervision Anticipated Caregiver's Contact Information: Maudie Mercury [phone] Ability/Limitations of Caregiver: daughters work days, but grand daughters and son in laws can help during the day Caregiver Availability: 24/7 Discharge Plan Discussed with Primary Caregiver: Yes Is Caregiver In Agreement with Plan?: Yes Does Caregiver/Family have Issues with Lodging/Transportation while Pt is in Rehab?: No  Family told on 3/11 in hospital by neurologist that he also felt pt's wife had memory issues and needed to be seen by her PCP. Daughters are arranging  this . Daughters also arranging follow up appointment at [facility] in [place] for OP clinic for diagnosis an treatment of presumed NPH. Family aware pt would d/c home to follow up as OP.  Goals/Additional Needs Patient/Family Goal for Rehab: supervision to min asisst with PT and OT, supervision SLP Expected length of stay: ELOS 10 to 14 days Equipment Needs: high fall risk and impulsiveness with decreased safety awareness Special Service Needs: Fall precautions Additional Information: Neurologist told family thatpt's wife likely has dementis and they need her assessses by her PCP; new DX Pt/Family Agrees to Admission and willing to participate: Yes Program Orientation Provided & Reviewed with Pt/Caregiver Including Roles  & Responsibilities: Yes  Patient Condition: I have reviewed medical records from [facility] , spoken with patient, spouse, and daughter. I met with patient at the bedside for inpatient rehabilitation assessment.  Patient will benefit from ongoing PT, OT, and SLP, can actively participate in 3 hours of therapy a day 5 days of the week, and can make measurable gains during the admission.  Patient will also benefit from the coordinated team approach during an Inpatient Acute Rehabilitation admission.  The patient will receive intensive therapy as well as Rehabilitation physician, nursing, social worker, and care management interventions.  Due to bowel management, bladder management, safety, skin/wound care, disease management, medical administration, pain management, patient education the patient requires 24 hour a day rehabilitation nursing.  The patient is currently mod assist with mobility and basic ADLs.  Discharge setting and therapy post discharge at  home with home health is anticipated.  Patient has agreed to participate in the Acute Inpatient Rehabilitation Program and will admit today.  Preadmission Screen Completed By:  Cleatrice Burke RN MSN, 06/01/2018 1:43 PM  ______________________________________________________________________   Discussed status with Dr. Letta Pate  on  06/01/2018  at  1342 and received telephone approval for admission today.  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 Does the need for close, 24 hr/day  Medical supervision in concert with the patient's rehab needs make it unreasonable for this patient to be served in a less intensive setting? Yes 2. Co-Morbidities requiring supervision/potential complications: Aflutter, CAD, DM 3. Due to bladder management, bowel management, safety, skin/wound care, disease management, medication administration, pain management and patient education, does  the patient require 24 hr/day rehab nursing? Yes 4. Does the patient require coordinated care of a physician, rehab nurse, PT (1-2 hrs/day, 5 days/week), OT (1-2 hrs/day, 5 days/week) and SLP (.5-1 hrs/day, 5 days/week) to address physical and functional deficits in the context of the above medical diagnosis(es)? Yes Addressing deficits in the following areas: balance, endurance, locomotion, strength, transferring, bowel/bladder control, bathing, dressing, feeding, grooming, toileting, cognition, swallowing and psychosocial support 5. Can the patient actively participate in an intensive therapy program of at least 3 hrs of therapy 5 days a week? Yes 6. The potential for patient to make measurable gains while on inpatient rehab is fair 7. Anticipated functional outcomes upon discharge from inpatients are: supervision PT, supervision OT, supervision SLP 8. Estimated rehab length of stay to reach the above functional goals is: 10-14d 9. Anticipated D/C setting: Home 10. Anticipated post D/C treatments: [place] therapy 11. Overall Rehab/Functional Prognosis: fair  Do not expect pt to get back to prior Mod I status  Julious Payer Audelia Acton RN MSN Admissions Coordinator 06/01/2018        Revision History

## 2018-06-01 NOTE — Progress Notes (Signed)
CSW notes patient will admit to CIR as per family request. CSW signing off.   Percell Locus Rayyan LCSW [phone]

## 2018-06-01 NOTE — Progress Notes (Signed)
Pt D/c'd to CIR transported to [phone]

## 2018-06-01 NOTE — Discharge Summary (Signed)
Physician Discharge Summary  [name] VHQ:[id number] DOB: [birth year] DOA: 05/29/2018  PCP: [name], MD  Admit date: 05/29/2018 Discharge date: 06/01/2018  Admitted From: Home Disposition:  CIR  Recommendations for Outpatient Follow-up:  1. Follow up with CIR provider at earliest convenience 2. Follow-up with neurology as an outpatient 3. Follow up in ED if symptoms worsen or new appear   Home Health: No Equipment/Devices: None  Discharge Condition: Stable CODE STATUS: Full Diet recommendation: As per SLP recommendations  Brief/Interim Summary: 85 year old male with history of atrial flutter, proximal A. fib, BPH, CAD, history of MI, degenerative disc disease, type 2 diabetes, dyslipidemia, left buttock MRSA abscess, hyperlipidemia, hypertension, hypothyroidism, Mnire's disease, stroke, seizure disorder, syncopal episode presented on 05/29/2018 with altered mental status with confusion and slurred speech with question of some jerking movement.  CT of the head did not show any acute abnormality.  MRI of the head was negative for any acute infarct.  EEG was negative for seizure-like activities.  Neurology has evaluated the patient and there might be a possibility of NPH, patient will need to be evaluated by [facility] in [place].  He will be discharged to CIR today.  Discharge Diagnoses:  Principal Problem:   AMS (altered mental status) Active Problems:   CAD S/P percutaneous coronary angioplasty   Dyslipidemia   Hypertension   Paroxysmal atrial fibrillation (HCC)   Hypothyroidism   Seizure disorder (HCC)   Altered mental status  altered mental status in a patient with history of CVA: Might be secondary to normal pressure hydrocephalus/worsening dementia/seizure episode/TIA -CT head was negative for acute abnormality.  MRI of the brain does not show any new stroke; there is old occipital/PCA territory infarct and old basal ganglia infarcts and left thalamic  infarcts -Mental status more improved now. -Continue PT and CR.  Diet as per SLP recommendations. -EEG did not show any seizure-like activity. -Neurology evaluation appreciated: Patient will need to be evaluated by [facility] in [place] to rule out NPH.   -He will be discharged to CIR today   history of seizures -Unclear if the patient had breakthrough seizure.  EEG was negative for seizure.  Continue Tegretol.  Tegretol level was 10 on presentation.  CAD status post PCI -Stable.  Continue statin.  Resume metoprolol  Paroxysmal A. fib -Resume metoprolol.  Continue Coumadin dose as per pharmacy.  Monitor INR  Hypothyroidism -Continue levothyroxine.  Dyslipidemia -Continue statin.  Hypertension -Continue isosorbide mononitrate.  Restart metoprolol, Lasix, losartan, isosorbide mononitrate.  Generalized deconditioning--continue PT eval.  Discharge Instructions  Discharge Instructions    Ambulatory referral to Neurology   Complete by:  As directed    An appointment is requested in approximately: altered mental status in 1-2 weeks   Diet - low sodium heart healthy   Complete by:  As directed    Diet Carb Modified   Complete by:  As directed    Increase activity slowly   Complete by:  As directed      Allergies as of 06/01/2018   No Known Allergies     Medication List    STOP taking these medications   amoxicillin 875 MG tablet Commonly known as:  AMOXIL   benzonatate 100 MG capsule Commonly known as:  TESSALON   cloNIDine 0.2 MG tablet Commonly known as:  CATAPRES   clotrimazole 1 % cream Commonly known as:  Lotrimin AF   diazepam 2 MG tablet Commonly known as:  VALIUM   fluticasone 50 MCG/ACT nasal spray  Commonly known as:  FLONASE     TAKE these medications   albuterol 108 (90 Base) MCG/ACT inhaler Commonly known as:  PROVENTIL HFA;VENTOLIN HFA Inhale 2 puffs into the lungs every 4 (four) hours as needed for wheezing or shortness of breath.    atorvastatin 80 MG tablet Commonly known as:  LIPITOR Take 1 tablet (80 mg total) by mouth daily at 6 PM.   carbamazepine 300 MG 12 hr capsule Commonly known as:  CARBATROL TAKE 1 CAPSULE BY MOUTH TWICE A DAY   escitalopram 10 MG tablet Commonly known as:  LEXAPRO TAKE 1 TABLET BY MOUTH EVERY DAY   furosemide 20 MG tablet Commonly known as:  LASIX Take 1 tablet (20 mg total) by mouth daily.   isosorbide mononitrate 30 MG 24 hr tablet Commonly known as:  IMDUR TAKE 1 TABLET BY MOUTH EVERY DAY   levothyroxine 75 MCG tablet Commonly known as:  SYNTHROID, LEVOTHROID TAKE 1 TABLET BY MOUTH EVERY DAY   losartan 100 MG tablet Commonly known as:  COZAAR TAKE 1/2 TABLET BY MOUTH 2 TIMES A DAY What changed:  See the new instructions.   metFORMIN 500 MG tablet Commonly known as:  GLUCOPHAGE TAKE 1 TABLET BY MOUTH TWICE A DAY WITH A MEAL What changed:  See the new instructions.   metoprolol tartrate 100 MG tablet Commonly known as:  LOPRESSOR TAKE 1 TABLET BY MOUTH TWICE A DAY   multivitamins ther. w/minerals Tabs tablet Take 1 tablet by mouth at bedtime.   warfarin 7.5 MG tablet Commonly known as:  COUMADIN Take as directed. If you are unsure how to take this medication, talk to your nurse or doctor. Original instructions:  TAKE 1 AND 1/2 TABLET BY MOUTH MONDAY, WEDNESDAY, AND FRIDAY AND 1 TAB ON ALL OTHER DAYS What changed:    how much to take  how to take this  when to take this  additional instructions       No Known Allergies  Consultations:  Neurology   Procedures/Studies: Dg Chest 2 View  Result Date: 05/29/2018 CLINICAL DATA:  Altered mental status EXAM: CHEST - 2 VIEW COMPARISON:  09/06/2016 FINDINGS: Shallow inspiration. Heart size and pulmonary vascularity are normal. Lungs appear clear and expanded. No blunting of costophrenic angles. No pneumothorax. Mediastinal contours appear intact. Calcified and tortuous aorta. IMPRESSION: No active  cardiopulmonary disease. Electronically Signed   By: Lucienne Capers M.D.   On: 05/29/2018 23:37   Ct Head Wo Contrast  Result Date: 05/30/2018 CLINICAL DATA:  85 year old male with confusion, altered mental status. EXAM: CT HEAD WITHOUT CONTRAST TECHNIQUE: Contiguous axial images were obtained from the base of the skull through the vertex without intravenous contrast. COMPARISON:  12/20/2015 and earlier. FINDINGS: Brain: Cerebral volume is not significantly changed. Stable ventricle size and configuration. Chronic left thalamic lacune is stable. No midline shift, mass effect, evidence of mass lesion, intracranial hemorrhage or evidence of cortically based acute infarction. No cortical encephalomalacia identified. Vascular: Calcified atherosclerosis at the skull base. New calcified plaque in a right MCA branch on series 3, image 17, but no other suspicious intracranial vascular hyperdensity. Skull: No acute osseous abnormality identified. Sinuses/Orbits: Stable and pneumatized right mastoidectomy. Paranasal sinuses and left mastoids are stable and well pneumatized. Other: No acute orbit or scalp soft tissue finding. IMPRESSION: 1. No intracranial hemorrhage or acute cortically based infarct identified. But a small focus of calcified plaque in a Right MCA branch is new since 2017. If the patient has left side weakness or  other right MCA ischemic symptoms then consider acute thromboembolic disease. 2. Chronic small vessel ischemia in the left thalamus. Electronically Signed   By: Genevie Ann M.D.   On: 05/30/2018 00:04   Mr Jodene Nam Head Wo Contrast  Result Date: 05/30/2018 CLINICAL DATA:  Confusion. Worsening gait difficulties for 2 weeks. History of hypertension, hyperlipidemia, diabetes and stroke. EXAM: MRI HEAD WITHOUT CONTRAST MRA HEAD WITHOUT CONTRAST TECHNIQUE: Multiplanar, multiecho pulse sequences of the brain and surrounding structures were obtained without intravenous contrast. Angiographic images of the  head were obtained using MRA technique without contrast. COMPARISON:  CT HEAD May 29, 2018 and MRI/MRA head January 30, 2013 FINDINGS: MRI HEAD FINDINGS-sequences are mildly or moderately motion degraded. INTRACRANIAL CONTENTS: No reduced diffusion to suggest acute ischemia. No susceptibility artifact to suggest hemorrhage. No parenchymal brain volume loss for age. No hydrocephalus. Small area LEFT occipital lobe encephalomalacia. Moderate parenchymal brain volume loss, sulcal effacement at the convexities with narrowed callosum angle. Patchy supratentorial white matter FLAIR T2 hyperintensities. Old bilateral basal ganglia and LEFT thalamus lacunar infarcts. No suspicious parenchymal signal, masses, mass effect. No abnormal extra-axial fluid collections. No extra-axial masses. VASCULAR: Normal major intracranial vascular flow voids present at skull base. SKULL AND UPPER CERVICAL SPINE: No abnormal sellar expansion. No suspicious calvarial bone marrow signal. Craniocervical junction maintained. SINUSES/ORBITS: The mastoid air-cells and included paranasal sinuses are well-aerated.The included ocular globes and orbital contents are non-suspicious. Status post bilateral ocular lens implants. OTHER: None. MRA HEAD FINDINGS ANTERIOR CIRCULATION: Normal flow related enhancement of the included cervical, petrous, cavernous and supraclinoid internal carotid arteries. Patent anterior communicating artery. Patent anterior and middle cerebral arteries. No large vessel occlusion, flow limiting stenosis, or aneurysm. POSTERIOR CIRCULATION: Codominant vertebral arteries. Vertebrobasilar arteries are patent, with normal flow related enhancement of the main branch vessels. Occluded LEFT P2 segment. Patent RIGHT posterior cerebral artery. Moderate stenosis RIGHT distal P2 segment. Small bilateral posterior communicating arteries present. No flow limiting stenosis, or aneurysm. ANATOMIC VARIANTS: Partially fenestrated distal RIGHT  vertebral arteries. Source data and MIP images were reviewed. IMPRESSION: MRI HEAD: 1. Motion degraded examination.  No acute intracranial process. 2. Old LEFT occipital/PCA territory infarct. Old small basal ganglia and LEFT thalamus infarcts. 3. Moderate chronic small vessel ischemic changes. 4. 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 It has MRA HEAD: 1. No emergent large vessel occlusion or flow-limiting stenosis. 2. Chronically occluded LEFT PCA. Moderate stenosis RIGHT P2 segment. Electronically Signed   By: Elon Alas M.D.   On: 05/30/2018 06:26   [name]  Result Date: 05/30/2018 CLINICAL DATA:  Confusion. Worsening gait difficulties for 2 weeks. History of hypertension, hyperlipidemia, diabetes and stroke. EXAM: MRI HEAD WITHOUT CONTRAST MRA HEAD WITHOUT CONTRAST TECHNIQUE: Multiplanar, multiecho pulse sequences of the brain and surrounding structures were obtained without intravenous contrast. Angiographic images of the head were obtained using MRA technique without contrast. COMPARISON:  CT HEAD May 29, 2018 and MRI/MRA head January 30, 2013 FINDINGS: MRI HEAD FINDINGS-sequences are mildly or moderately motion degraded. INTRACRANIAL CONTENTS: No reduced diffusion to suggest acute ischemia. No susceptibility artifact to suggest hemorrhage. No parenchymal brain volume loss for age. No hydrocephalus. Small area LEFT occipital lobe encephalomalacia. Moderate parenchymal brain volume loss, sulcal effacement at the convexities with narrowed callosum angle. Patchy supratentorial white matter FLAIR T2 hyperintensities. Old bilateral basal ganglia and LEFT thalamus lacunar infarcts. No suspicious parenchymal signal, masses, mass effect. No abnormal extra-axial fluid collections. No extra-axial masses. VASCULAR: Normal major intracranial vascular flow voids  present at skull base. SKULL AND UPPER CERVICAL SPINE: No abnormal sellar expansion. No suspicious calvarial bone  marrow signal. Craniocervical junction maintained. SINUSES/ORBITS: The mastoid air-cells and included paranasal sinuses are well-aerated.The included ocular globes and orbital contents are non-suspicious. Status post bilateral ocular lens implants. OTHER: None. MRA HEAD FINDINGS ANTERIOR CIRCULATION: Normal flow related enhancement of the included cervical, petrous, cavernous and supraclinoid internal carotid arteries. Patent anterior communicating artery. Patent anterior and middle cerebral arteries. No large vessel occlusion, flow limiting stenosis, or aneurysm. POSTERIOR CIRCULATION: Codominant vertebral arteries. Vertebrobasilar arteries are patent, with normal flow related enhancement of the main branch vessels. Occluded LEFT P2 segment. Patent RIGHT posterior cerebral artery. Moderate stenosis RIGHT distal P2 segment. Small bilateral posterior communicating arteries present. No flow limiting stenosis, or aneurysm. ANATOMIC VARIANTS: Partially fenestrated distal RIGHT vertebral arteries. Source data and MIP images were reviewed. IMPRESSION: MRI HEAD: 1. Motion degraded examination.  No acute intracranial process. 2. Old LEFT occipital/PCA territory infarct. Old small basal ganglia and LEFT thalamus infarcts. 3. Moderate chronic small vessel ischemic changes. 4. 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 It has MRA HEAD: 1. No emergent large vessel occlusion or flow-limiting stenosis. 2. Chronically occluded LEFT PCA. Moderate stenosis RIGHT P2 segment. Electronically Signed   By: Elon Alas M.D.   On: 05/30/2018 06:26   Vas US Carotid (at [facility] Only)  Result Date: 05/30/2018 Carotid Arterial Duplex Study Indications: TIA and Speech disturbance. Performing Technologist: Maudry Mayhew MHA, RDMS, RVT, RDCS  Examination Guidelines: A complete evaluation includes B-mode imaging, spectral Doppler, color Doppler, and power Doppler as needed of all accessible portions of each  vessel. Bilateral testing is considered an integral part of a complete examination. Limited examinations for reoccurring indications may be performed as noted.  Right Carotid Findings: +----------+-------+-------+--------+---------------------------------+--------+           PSV    EDV    StenosisDescribe                         Comments           cm/s   cm/s                                                     +----------+-------+-------+--------+---------------------------------+--------+ CCA Prox  154    18             irregular and heterogenous                +----------+-------+-------+--------+---------------------------------+--------+ CCA Distal143    31             heterogenous and calcific                 +----------+-------+-------+--------+---------------------------------+--------+ ICA Prox  173    28             irregular, heterogenous and                                               calcific                                  +----------+-------+-------+--------+---------------------------------+--------+  ICA Distal101    24                                                       +----------+-------+-------+--------+---------------------------------+--------+ ECA       144    14             heterogenous, calcific and                                                irregular                                 +----------+-------+-------+--------+---------------------------------+--------+ +----------+--------+-------+----------------+-------------------+           PSV cm/sEDV cmsDescribe        Arm Pressure (mmHG) +----------+--------+-------+----------------+-------------------+ [id number]             Multiphasic, WNL                    +----------+--------+-------+----------------+-------------------+ +---------+--------+--+--------+--+---------+ VertebralPSV cm/s46EDV cm/s13Antegrade  +---------+--------+--+--------+--+---------+  Left Carotid Findings: +----------+-------+-------+--------+---------------------------------+--------+           PSV    EDV    StenosisDescribe                         Comments           cm/s   cm/s                                                     +----------+-------+-------+--------+---------------------------------+--------+ CCA Prox  111    16             heterogenous and smooth                   +----------+-------+-------+--------+---------------------------------+--------+ CCA Distal72     24             smooth and heterogenous                   +----------+-------+-------+--------+---------------------------------+--------+ ICA Prox  75     20                                                       +----------+-------+-------+--------+---------------------------------+--------+ ICA Distal120    26                                                       +----------+-------+-------+--------+---------------------------------+--------+ ECA       97     12             heterogenous, calcific and  irregular                                 +----------+-------+-------+--------+---------------------------------+--------+ +----------+--------+--------+----------------+-------------------+ SubclavianPSV cm/sEDV cm/sDescribe        Arm Pressure (mmHG) +----------+--------+--------+----------------+-------------------+           248             Multiphasic, WNL                    +----------+--------+--------+----------------+-------------------+ +---------+--------+--+--------+--+---------+ VertebralPSV cm/s54EDV cm/s11Antegrade +---------+--------+--+--------+--+---------+  Summary: Right Carotid: Velocities in the right ICA are consistent with a 1-39% stenosis. Left Carotid: Velocities in the left ICA are consistent with a 1-39% stenosis. Vertebrals:   Bilateral vertebral arteries demonstrate antegrade flow. Subclavians: Normal flow hemodynamics were seen in bilateral subclavian              arteries. *See table(s) above for measurements and observations.  Electronically signed by Antony Contras MD on 05/30/2018 at 5:56:53 PM.    Final      EEG IMPRESSION: This EEG is characterized by slowing which is consistent with normal drowse and sleep.  Can not rule out the possibility of slowing related to general cerebral disturbance such as a metabolic encephalopathy.  Clinical correlation recommended.  No epileptiform activity is noted.    Subjective: Patient seen and examined at bedside.  He is more awake.  Poor historian.  No overnight fever, nausea or vomiting.  Spoke to wife and son-in-law at bedside.  No seizure-like activities overnight.  Discharge Exam: Vitals:   06/01/18 0835 06/01/18 0949  BP: (!) 146/63 140/69  Pulse:    Resp:    Temp:    SpO2:     Vitals:   05/31/18 2024 06/01/18 0555 06/01/18 0835 06/01/18 0949  BP: 134/73 (!) 85/66 (!) 146/63 140/69  Pulse: 100 91    Resp: 18 16    Temp: 98.4 F (36.9 C) 98 F (36.7 C)    TempSrc: Oral Oral    SpO2: 96% 99%      General: Pt is alert, awake, not in acute distress.  Very poor historian.  Answers some questions.  No seizure-like activities currently Cardiovascular: rate controlled, S1/S2 + Respiratory: bilateral decreased breath sounds at bases Abdominal: Soft, NT, ND, bowel sounds + Extremities: no edema, no cyanosis    The results of significant diagnostics from this hospitalization (including imaging, microbiology, ancillary and laboratory) are listed below for reference.     Microbiology: Recent Results (from the past 240 hour(s))  MRSA PCR Screening     Status: None   Collection Time: 05/30/18  6:49 PM  Result Value Ref Range Status   MRSA by PCR NEGATIVE NEGATIVE Final    Comment:        The GeneXpert MRSA Assay (FDA approved for NASAL specimens only), is  one component of a comprehensive MRSA colonization surveillance program. It is not intended to diagnose MRSA infection nor to guide or monitor treatment for MRSA infections. Performed at [facility], [place] [address], [place], [place] [address]      Labs: BNP (last 3 results) No results for input(s): BNP in the last 8760 hours. Basic Metabolic Panel: Recent Labs  Lab 05/30/18 0110 05/30/18 0145 05/30/18 0326 05/31/18 0328 06/01/18 0323  NA 131* 132* 133* 136 133*  K 4.9 4.7 4.8 4.0 4.0  CL 99  --  98 99 98  CO2 23  --  23 28 26   GLUCOSE 122*  --  107* 105* 121*  BUN 19  --  21 14 20   CREATININE 1.17  --  1.13 0.99 1.19  CALCIUM 8.9  --  9.0 9.2 9.2  MG  --   --   --   --  1.9   Liver Function Tests: Recent Labs  Lab 05/30/18 0326 06/01/18 0323  AST 19 20  ALT 15 16  ALKPHOS 71 71  BILITOT 0.9 0.4  PROT 6.1* 6.4*  ALBUMIN 3.7 3.6   No results for input(s): LIPASE, AMYLASE in the last 168 hours. Recent Labs  Lab 05/30/18 0111  AMMONIA 25   CBC: Recent Labs  Lab 05/30/18 0110 05/30/18 0145 05/30/18 0326 05/31/18 0328 06/01/18 0323  WBC 12.1*  --  12.5* 10.3 11.6*  NEUTROABS 8.6*  --  8.5*  --  7.6  HGB 12.2* 11.9* 13.0 13.2 13.0  HCT 38.9* 35.0* 39.2 40.7 40.2  MCV 93.7  --  93.3 91.3 91.4  PLT 278  --  272 289 315   Cardiac Enzymes: No results for input(s): CKTOTAL, CKMB, CKMBINDEX, TROPONINI in the last 168 hours. BNP: Invalid input(s): POCBNP CBG: Recent Labs  Lab 05/30/18 1800 05/31/18 1656 05/31/18 2200 06/01/18 0754 06/01/18 1202  GLUCAP 114* 116* 160* 124* 166*   D-Dimer No results for input(s): DDIMER in the last 72 hours. Hgb A1c Recent Labs    05/30/18 0326 06/01/18 0323  HGBA1C 6.6* 6.7*   Lipid Profile Recent Labs    05/30/18 0440  CHOL 179  HDL 50  LDLCALC 103*  TRIG 131  CHOLHDL 3.6   Thyroid function studies Recent Labs    06/01/18 0323  TSH 3.023   Anemia work up Recent Labs    06/01/18 0323   VITAMINB12 591   Urinalysis    Component Value Date/Time   COLORURINE YELLOW 05/29/2018 2331   APPEARANCEUR HAZY (A) 05/29/2018 2331   LABSPEC 1.024 05/29/2018 2331   PHURINE 6.0 05/29/2018 2331   GLUCOSEU NEGATIVE 05/29/2018 2331   HGBUR NEGATIVE 05/29/2018 2331   BILIRUBINUR NEGATIVE 05/29/2018 2331   KETONESUR NEGATIVE 05/29/2018 2331   PROTEINUR 30 (A) 05/29/2018 2331   UROBILINOGEN 0.2 10/22/2013 1451   NITRITE NEGATIVE 05/29/2018 2331   LEUKOCYTESUR NEGATIVE 05/29/2018 2331   Sepsis Labs Invalid input(s): PROCALCITONIN,  WBC,  LACTICIDVEN Microbiology Recent Results (from the past 240 hour(s))  MRSA PCR Screening     Status: None   Collection Time: 05/30/18  6:49 PM  Result Value Ref Range Status   MRSA by PCR NEGATIVE NEGATIVE Final    Comment:        The GeneXpert MRSA Assay (FDA approved for NASAL specimens only), is one component of a comprehensive MRSA colonization surveillance program. It is not intended to diagnose MRSA infection nor to guide or monitor treatment for MRSA infections. Performed at [facility], [place] [address] [place], [place] [address]      Time coordinating discharge: 35 minutes  SIGNED:   Aline August, MD  [facility] 06/01/2018, 12:17 PM

## 2018-06-01 NOTE — PMR Pre-admission (Signed)
PMR Admission Coordinator Pre-Admission Assessment  Patient: [name] is an 85 y.o., male MRN: [id number] DOB: 1933-06-13 Height:   Weight:    Insurance Information HMO:     PPO:      PCP:      IPA:      80/20:      OTHER: no HMO PRIMARY: Medicare a and b      Policy#: [id number]      Subscriber: pt Benefits:  Phone #: passport one online     Name: 06/01/2018 Eff. Date: 06/20/2000     Deduct: $1408      Out of Pocket Max: none      Life Max: none CIR: 100%      SNF: 20 full days Outpatient: 80%     Co-Pay: 20% Home Health: 100%      Co-Pay: none DME: 80%     Co-Pay: 20% Providers: pt choice  SECONDARY: [employer]      Policy#: [id number]      Subscriber: pt  Medicaid Application Date:       Case Manager:  Disability Application Date:       Case Worker:   Emergency Contact Information Contact Information    Name Relation Home Work Mobile   Kasey,Kim Daughter   [phone]   Am[place]e, Leeper   [phone]   Idelle Jo Daughter   [phone]      Current Medical History  Patient Admitting Diagnosis: debility; possible NPH  History of Present Illness: [name] is an 85 year old male with history of 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  Episode lasted approximately 2 to 5 minutes.  Family reported issues with urinary incontinence, shuffling gait with falls and progressive cognitive decline. CT head negative for acute changes.  He was treated with IVF and tegretol levels WNL - 10.  He continued to have waxing and waning of symptoms with ongoing confusion.  Dr. Cheral Marker consulted due to concerns of seizure activity and question orthostatic symptoms.  EEG was negative for epileptiform activity. MRI/MRA brain done revealing old left occipital/PCA territory  infarct, chronic occluded L-PCA old basal ganglia and left thalamic infarcts, moderate small vessel disease and findings of mild chronic communicating hydrocephalus.  He recommended follow up with neurology past discharge as well as NPH clinic at [facility] due to possibility of NPH. Patient with cognitive deficits with confabulatory speech, word finding deficits, poor sitting and standing balance as well as deficits in processing affecting ADLs and mobility.   Patient's medical record from [facility] has been reviewed by the rehabilitation admission coordinator and physician.   Past Medical History  Past Medical History:  Diagnosis Date  . Atrial flutter ([place])   . BPH (benign prostatic hyperplasia)   . Coronary artery disease    a. s/p MI tx with Cypher DES to pRCA in 4/04;  b. Echocardiogram 7/09: Normal LV function.  c. Nuclear study 3/13 no ischemia;  d. ETT 2/14 neg;  e. admx with CP => [facility] (01/30/2013):  pLAD 40-50%, oD1 70-80%, oOM1 40%, pRCA stent patent, mid RCA 30%, EF 60-65%. => med Rx.  . DDD (degenerative disc disease)   . Diabetes mellitus without complication ([place])   . Dyslipidemia   . Hemorrhoids   . Hx MRSA infection    left buttocks abscess  .  Hx of echocardiogram    a. Echocardiogram (01/31/2013): Mild focal basal and mild concentric hypertrophy of the septum, EF 50-55%, normal wall motion, grade 1 diastolic dysfunction, trivial AI, MAC, mild LAE, PASP 35  . Hyperlipidemia   . Hypertension   . Hypothyroidism   . Meniere's disease    Status post shunt  . Myocardial infarction ([place]) 2008  . Occlusion and stenosis of carotid artery without mention of cerebral infarction    40-59% on carotid doppler 2014; [place] (01/2013): R 1-39%, L 60-79%  . Rotator cuff injury    chronic rotator cuff injury status post repair  . Seizure disorder ([place])   . Stroke [facility])    a. 01/2013=> post cardiac cath CVA to L post communicating artery system; R sided weakness  . Syncope     Family  History   family history includes Aneurysm in his mother; Coronary artery disease in his brother; Diabetes in his brother; Heart attack (age of onset: 59) in his father.  Prior Rehab/Hospitalizations Has the patient had major surgery during 100 days prior to admission? No CIR 2014 with CVA Dr. Naaman Plummer team for 12 days and discharged home at supervision level  Current Medications  Current Facility-Administered Medications:  .   stroke: mapping our early stages of recovery book, , Does not apply, Once, Reubin Milan, MD .  acetaminophen (TYLENOL) tablet 650 mg, 650 mg, Oral, Q6H PRN **OR** acetaminophen (TYLENOL) suppository 650 mg, 650 mg, Rectal, Q6H PRN, Reubin Milan, MD .  atorvastatin (LIPITOR) tablet 80 mg, 80 mg, Oral, q1800, Reubin Milan, MD, 80 mg at 05/31/18 1802 .  carbamazepine (TEGRETOL XR) 12 hr tablet 300 mg, 300 mg, Oral, BID, Reubin Milan, MD, 300 mg at 06/01/18 7619 .  hydrALAZINE (APRESOLINE) injection 10 mg, 10 mg, Intravenous, Q4H PRN, Kirby-Graham, Karsten Fells, NP .  insulin aspart (novoLOG) injection 0-5 Units, 0-5 Units, Subcutaneous, QHS, Alekh, Kshitiz, MD .  insulin aspart (novoLOG) injection 0-9 Units, 0-9 Units, Subcutaneous, TID WC, Starla Link, Kshitiz, MD, 2 Units at 06/01/18 1232 .  isosorbide mononitrate (IMDUR) 24 hr tablet 30 mg, 30 mg, Oral, Daily, Reubin Milan, MD, 30 mg at 06/01/18 0949 .  levothyroxine (SYNTHROID, LEVOTHROID) tablet 75 mcg, 75 mcg, Oral, Daily, [name], MD, 75 mcg at 06/01/18 0601 .  metoprolol tartrate (LOPRESSOR) tablet 100 mg, 100 mg, Oral, BID, Starla Link, Kshitiz, MD, 100 mg at 06/01/18 0949 .  ondansetron (ZOFRAN) tablet 4 mg, 4 mg, Oral, Q6H PRN **OR** ondansetron (ZOFRAN) injection 4 mg, 4 mg, Intravenous, Q6H PRN, Reubin Milan, MD .  Warfarin - Pharmacist Dosing Inpatient, , Does not apply, q1800, Laren Everts, RPH  Patients Current Diet:   Diet Order            Diet - low sodium heart healthy         Diet Carb Modified        Diet Heart Room service appropriate? 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 Self Care: Did the patient need help bathing, dressing, using the toilet or eating?  Independent  Indoor Mobility: Did the patient need assistance with walking from room to room (with or without device)? Independent  Stairs: Did the patient need assistance with internal or external stairs (with or without device)? Independent  Functional Cognition: Did the patient need help planning regular tasks such as shopping or remembering to take medications? [place] / [place] Devices/Equipment: Gilford Rile (specify type) Home Equipment: Walker - 2 wheels, Walker - 4 wheels, Cane - single point, Shower seat  Prior Device Use: Indicate devices/aids used by the patient prior to current illness, exacerbation or injury? None of the above  Prior Functional Level Vocation: Retired Comments: family members report pt has been deconditioning more in previous 2 weeks   Prior Functional  Level Current Functional Level  Bed Mobility  Independent Min assist for trunk control and to maintain sitting balance at EOB.  Transfers Independent Mod assist  Mobility - Walk/Wheelchair Independent  mod assist  Mobility - Ambulation/Gait Independent Mod assist, +2 physical assistance side steps at EOB with RW. patient difficulty with sequencing and required verbal and tactile cues to take steps  Upper Body Dressing Independent Set up, Sitting  Lower Body Dressing Independent Moderate assistance, Sit to/from stand  Grooming Independent Set up, Sitting  Eating/Drinking Independent Set up, Sitting  Toilet Transfer Independent Moderate assistance, Stand-pivot  Bladder Continence Continent until 2 weeks ago  incontinent  Bowel Management  continent   continent  Stair Climbing independent   not attempted  Communication independent  confused  Memory intact   not at baseline  Cooking/Meal Prep  independent     Housework  independent   Money Management  independent   Driving  yes    Was independent until 2 weeks ago with now decline in function per daughter, Maudie Mercury.  Special needs/care consideration BiPAP/CPAP n/a CPM n/a Continuous Drip IV n/a Dialysis n/a Life Vest n/a Oxygen n/a Special Bed fall precautions needed Trach Size n/a Wound Vac n/a Skin dry, abrasion and ecchymosis to right elbow, back to the left; MASD to buttocks and perineal area Bowel mgmt: continent LBM 3/10 Bladder mgmt: incontinent Diabetic mgmt n/a  Previous Home Environment Living Arrangements: Spouse/significant other  Lives With: Spouse Available Help at Discharge: Family, Available 24 hours/day Type of Home: House Home Layout: One level Home Access: Stairs to enter Entrance Stairs-Rails: Right, Left, Can reach both Entrance Stairs-Number of Steps: 2 Bathroom Shower/Tub: Tub/shower unit, Multimedia programmer: Standard Bathroom Accessibility: Yes How Accessible: Accessible via  walker Home Care Services: No Additional Comments: wife with new dx dementia, Maudie Mercury , daughter prefers CIR  Discharge Living Setting Plans for Discharge Living Setting: Patient's home, Lives with (comment)(spouse) Type of Home at Discharge: House Discharge Home Layout: One level Discharge Home Access: Stairs to enter Entrance Stairs-Rails: Right, Left, Can reach both Entrance Stairs-Number of Steps: 2 Discharge Bathroom Shower/Tub: Tub/shower unit Discharge Bathroom Toilet: Standard Discharge Bathroom Accessibility: Yes How Accessible: Accessible via walker Does the patient have any problems obtaining your medications?: No  Social/Family/Support Systems Patient Roles: Spouse,  Parent(retired preacher) Contact Information: York Ram, daughter is [facility] Anticipated Caregiver: daughter, Maudie Mercury nad other granddaughters will araange 24/7 supervision Anticipated Caregiver's Contact Information: Maudie Mercury [phone] Ability/Limitations of Caregiver: daughters work days, but grand daughters and son in laws can help during the day Caregiver Availability: 24/7 Discharge Plan Discussed with Primary Caregiver: Yes Is Caregiver In Agreement with Plan?: Yes Does Caregiver/Family have Issues with Lodging/Transportation while Pt is in Rehab?: No  Family told on 3/11 in hospital by neurologist that he also felt pt's wife had memory issues and needed to be seen by her PCP. Daughters are arranging this . Daughters also arranging follow up appointment at [facility] in [place] for OP clinic for diagnosis an treatment of presumed NPH. Family aware pt would d/c home to follow up as OP.  Goals/Additional Needs Patient/Family Goal for Rehab: supervision to min asisst with PT and OT, supervision SLP Expected length of stay: ELOS 10 to 14 days Equipment Needs: high fall risk and impulsiveness with decreased safety awareness Special Service Needs: Fall precautions Additional Information: Neurologist told family thatpt's wife  likely has dementis and they need her assessses by her PCP; new DX Pt/Family Agrees to Admission and willing to participate: Yes Program Orientation Provided & Reviewed with Pt/Caregiver Including Roles  & Responsibilities: Yes  Patient Condition: I have reviewed medical records from [facility] , spoken with patient, spouse, and daughter. I met with patient at the bedside for inpatient rehabilitation assessment.  Patient will benefit from ongoing PT, OT, and SLP, can actively participate in 3 hours of therapy a day 5 days of the week, and can make measurable gains during the admission.  Patient will also benefit from the coordinated team approach during an Inpatient Acute Rehabilitation admission.  The patient will receive intensive therapy as well as Rehabilitation physician, nursing, social worker, and care management interventions.  Due to bowel management, bladder management, safety, skin/wound care, disease management, medical administration, pain management, patient education the patient requires 24 hour a day rehabilitation nursing.  The patient is currently mod assist with mobility and basic ADLs.  Discharge setting and therapy post discharge at  home with home health is anticipated.  Patient has agreed to participate in the Acute Inpatient Rehabilitation Program and will admit today.  Preadmission Screen Completed By:  Cleatrice Burke RN MSN, 06/01/2018 1:43 PM ______________________________________________________________________   Discussed status with Dr. Letta Pate  on  06/01/2018  at  1342 and received telephone approval for admission today.  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 Does the need for close, 24 hr/day  Medical supervision in concert with the patient's rehab needs make it unreasonable for this patient to be served in a less intensive setting?  Yes 2. Co-Morbidities requiring supervision/potential complications: Aflutter, CAD, DM 3. Due to bladder management, bowel management, safety, skin/wound care, disease management, medication administration, pain management and patient education, does the patient require 24 hr/day rehab nursing? Yes 4. Does the patient require coordinated care of a physician, rehab nurse, PT (1-2 hrs/day, 5 days/week), OT (1-2 hrs/day, 5 days/week) and SLP (.5-1 hrs/day, 5 days/week) to address physical and functional deficits in the context of the above medical diagnosis(es)? Yes Addressing deficits in the following areas: balance, endurance, locomotion, strength, transferring, bowel/bladder control, bathing, dressing, feeding, grooming, toileting, cognition, swallowing and psychosocial support 5. Can the patient actively participate in an intensive therapy program of at  least 3 hrs of therapy 5 days a week? Yes 6. The potential for patient to make measurable gains while on inpatient rehab is fair 7. Anticipated functional outcomes upon discharge from inpatients are: supervision PT, supervision OT, supervision SLP 8. Estimated rehab length of stay to reach the above functional goals is: 10-14d 9. Anticipated D/C setting: Home 10. Anticipated post D/C treatments: [place] therapy 11. Overall Rehab/Functional Prognosis: fair  Do not expect pt to get back to prior Mod I status  Julious Payer Audelia Acton RN MSN Admissions Coordinator 06/01/2018

## 2018-06-01 NOTE — Progress Notes (Signed)
Inpatient Rehabilitation Admissions Coordinator  Inpatient Rehab Consult received. I met with pt's wife, and daughter at bedside. at the bedside for rehabilitation assessment. We discussed goals and expectations of an inpatient rehab admission.  They are in agreement to admit and prefer inpt rehab rather than SNF. I will contact Dr. Starla Link for clarification for admit today. Case discussed with Dr. Naaman Plummer for approval to admit.SW, Percell Locus, made aware of d/c to CIR.  Danne Baxter, RN, MSN Rehab Admissions Coordinator [phone] 06/01/2018 11:39 AM

## 2018-06-01 NOTE — H&P (Signed)
Physical Medicine and Rehabilitation Admission H&P        Chief Complaint  Patient presents with   Functional decline.       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  Episode lasted approximately 2 to 5 minutes with onset of confusion.  Family reported issues with urinary incontinence, shuffling gait with falls and cognitive decline in the past year. CT head negative for acute changes.   He was treated with IVF and tegretol levels WNL - 10.  He continued to have waxing and waning of symptoms with ongoing confusion.  Dr. Cheral Marker consulted due to concerns of seizure activity and question orthostatic symptoms.  EEG was negative for epileptiform activity. MRI/MRA brain done revealing old left occipital/PCA territory infarct, chronic occluded L-PCA old basal ganglia and left thalamic infarcts, moderate small vessel disease and findings of mild chronic communicating hydrocephalus.  He recommended follow up with neurology past discharge as well as NPH clinic at [facility] due to possibility of NPH.   He had another episode last night that resolved without residual side effects. Patient with cognitive deficits with confabulatory speech, word finding deficits, poor sitting and standing balance as well as deficits in processing affecting ADLs and mobility. CIR recommended for follow up therapy.      Review of Systems  Constitutional: Negative for chills and fever.  HENT: Negative for hearing loss and tinnitus.   Eyes: Negative for blurred vision and double vision.  Respiratory: Negative for cough.   Cardiovascular: Negative for chest pain.  Gastrointestinal: Negative for constipation, heartburn and nausea.   Genitourinary: Positive for frequency (gets up every hour at nights). Negative for dysuria and urgency.  Musculoskeletal: Positive for falls and joint pain.  Skin: Negative for rash.  Neurological: Positive for speech change (has bouts of aphasia. ). Negative for dizziness, focal weakness and headaches.            Past Medical History:  Diagnosis Date   Atrial flutter ([place])     BPH (benign prostatic hyperplasia)     Coronary artery disease      a. s/p MI tx with Cypher DES to pRCA in 4/04;  b. Echocardiogram 7/09: Normal LV function.  c. Nuclear study 3/13 no ischemia;  d. ETT 2/14 neg;  e. admx with CP => LHC (01/30/2013):  pLAD 40-50%, oD1 70-80%, oOM1 40%, pRCA stent patent, mid RCA 30%, EF 60-65%. => med Rx.   DDD (degenerative disc disease)     Diabetes mellitus without complication ([place])     Dyslipidemia     Hemorrhoids     Hx MRSA infection      left buttocks abscess   Hx of echocardiogram      a. Echocardiogram (01/31/2013): Mild focal basal and mild concentric hypertrophy of the septum, EF 50-55%, normal wall motion, grade 1 diastolic dysfunction, trivial AI, MAC, mild LAE, PASP 35   Hyperlipidemia     Hypertension     Hypothyroidism     Meniere's disease      Status post shunt   Myocardial infarction [facility]) 2008   Occlusion and stenosis of carotid artery without mention of cerebral infarction      40-59% on carotid doppler 2014; [place] (01/2013): R 1-39%, L  60-79%   Rotator cuff injury      chronic rotator cuff injury status post repair   Seizure disorder ([place])     Stroke ([place])      a. 01/2013=> post cardiac cath CVA to L post communicating artery system; R sided weakness   Syncope        Past Surgical History:  Procedure Laterality Date   COLONOSCOPY       CORONARY ANGIOPLASTY WITH STENT PLACEMENT   07/14/2002    Stent to the right coronary artery   ENDOLYMPHATIC SHUNT DECOMPRESSION   06/11/2009     Right endolymphatic sac decompression and  shunt  placement   HERNIA REPAIR   04/10/2008    scrotal hernia repair   LEFT HEART CATHETERIZATION WITH CORONARY ANGIOGRAM N/A 01/30/2013    Procedure: LEFT HEART CATHETERIZATION WITH CORONARY ANGIOGRAM;  Surgeon: Larey Dresser, MD;  Location[facility] C[facility] LAB;  Service: Cardiovascular;  Laterality: N/A;   ROTATOR CUFF REPAIR        for chronic rotator cuff injury           Family History  Problem Relation Age of Onset   Heart attack Father 32   Aneurysm Mother          brain aneurysm   Coronary artery disease Brother          with CABG   Diabetes Brother     Colon cancer Neg Hx     Colon polyps Neg Hx        Social History: Married. Retired Environmental education officer. He   reports that he quit smoking about 61 years ago. His smoking use included cigarettes. He quit after 5.00 years of use. He has quit using smokeless tobacco.  His smokeless tobacco use included chew. He reports that he does not drink alcohol or use drugs.      Allergies: No Known Allergies            Medications Prior to Admission  Medication Sig Dispense Refill   albuterol (PROVENTIL HFA;VENTOLIN HFA) 108 (90 Base) MCG/ACT inhaler Inhale 2 puffs into the lungs every 4 (four) hours as needed for wheezing or shortness of breath. 1 Inhaler 0   amoxicillin (AMOXIL) 875 MG tablet Take 875 mg by mouth 2 (two) times daily.       atorvastatin (LIPITOR) 80 MG tablet TAKE 1 TABLET BY MOUTH EVERY DAY (Patient taking differently: Take 80 mg by mouth daily at 6 PM. ) 90 tablet 3   carbamazepine (CARBATROL) 300 MG 12 hr capsule TAKE 1 CAPSULE BY MOUTH TWICE A DAY (Patient taking differently: Take 300 mg by mouth 2 (two) times daily. ) 180 capsule 3   cloNIDine (CATAPRES) 0.2 MG tablet Take 1 tablet (0.2 mg total) by mouth 2 (two) times daily. 180 tablet 3   diazepam (VALIUM) 2 MG tablet TAKE 1 TABLET BY MOUTH TWICE A DAY AS NEEDED (Patient taking differently: Take 2 mg by mouth 2 (two) times daily as needed for anxiety. ) 60  tablet 2   escitalopram (LEXAPRO) 10 MG tablet TAKE 1 TABLET BY MOUTH EVERY DAY 90 tablet 3   furosemide (LASIX) 20 MG tablet TAKE 1 TABLET BY MOUTH EVERY DAY (Patient taking differently: Take 20 mg by mouth daily. ) 90 tablet 3   isosorbide mononitrate (IMDUR) 30 MG 24 hr tablet TAKE 1 TABLET BY MOUTH EVERY DAY (Patient taking differently: Take 30 mg by mouth daily. ) 90 tablet 1   levothyroxine (SYNTHROID, LEVOTHROID) 75  MCG tablet TAKE 1 TABLET BY MOUTH EVERY DAY (Patient taking differently: Take 75 mcg by mouth daily. ) 90 tablet 3   losartan (COZAAR) 100 MG tablet TAKE 1/2 TABLET BY MOUTH 2 TIMES A DAY (Patient taking differently: Take 50 mg by mouth 2 (two) times daily. ) 90 tablet 3   metFORMIN (GLUCOPHAGE) 500 MG tablet TAKE 1 TABLET BY MOUTH TWICE A DAY WITH A MEAL (Patient taking differently: Take 500 mg by mouth 2 (two) times daily with a meal. ) 180 tablet 2   metoprolol tartrate (LOPRESSOR) 100 MG tablet TAKE 1 TABLET BY MOUTH TWICE A DAY (Patient taking differently: Take 100 mg by mouth 2 (two) times daily. ) 180 tablet 3   Multiple Vitamins-Minerals (MULTIVITAMINS THER. W/MINERALS) TABS Take 1 tablet by mouth at bedtime.         warfarin (COUMADIN) 7.5 MG tablet TAKE 1 AND 1/2 TABLET BY MOUTH MONDAY, WEDNESDAY, AND FRIDAY AND 1 TAB ON ALL OTHER DAYS (Patient taking differently: Take 7.5-11.25 mg by mouth See admin instructions. TAKE 1 AND 1/2 TABLET BY MOUTH MONDAY, WEDNESDAY, AND FRIDAY AND 1 TAB ON ALL OTHER DAYS.) 180 tablet 3   benzonatate (TESSALON) 100 MG capsule Take 1 capsule (100 mg total) by mouth 3 (three) times daily as needed for cough. (Patient not taking: Reported on 05/30/2018) 30 capsule 0   clotrimazole (LOTRIMIN AF) 1 % cream Apply 1 application topically 2 (two) times daily. (Patient not taking: Reported on 05/30/2018) 30 g 0   fluticasone (FLONASE) 50 MCG/ACT nasal spray Place 2 sprays into both nostrils daily. (Patient not taking: Reported on 05/30/2018) 16 g  6      Drug Regimen Review  Drug regimen was reviewed and remains appropriate with no significant issues identified   Home: Home Living Family/patient expects to be discharged to:: Private residence Living Arrangements: Spouse/significant other Available Help at Discharge: Family, Available 24 hours/day Type of Home: House Home Access: Stairs to enter [employer] of Steps: 2 Entrance Stairs-Rails: Right, Left, Can reach both Home Layout: One level Bathroom Shower/Tub: Tub/shower unit, Walk-in shower Home Equipment: Environmental consultant - 2 wheels, Environmental consultant - 4 wheels, [place] - single point, Civil engineer, contracting Additional Comments: family/pt verbalized preference for SNF d/c;family stated pt's wife unable to provide apporpriate level of assistance   Lives With: Spouse   Functional History: Prior Function Level of Independence: Needs assistance Gait / Transfers Assistance Needed: pt reports use of cane/walker PRN, however family reports pt was not using AD;pt reports he feels he did not gain strength back since stroke;reports history of falling secondary to BLE weakness ADL's / Homemaking Assistance Needed: Reports needing occasional assistance for ADLs Comments: family members report pt has been deconditioning more in previous 2 weeks   Functional Status:  Mobility: Bed Mobility Overal bed mobility: Needs Assistance Bed Mobility: Supine to Sit Supine to sit: Mod assist Sit to supine: Min assist General bed mobility comments: modA to progress trunk to upright position sitting EOB;multimodal cues to bring hips toward EOB Transfers Overall transfer level: Needs assistance Equipment used: Rolling walker (2 wheeled) Transfers: Sit to/from Stand, [employer] Transfers Sit to Stand: Mod assist Stand pivot transfers: Mod assist General transfer comment: modA to lift to stand with use of RW;vc for safe hand placement;minA to maintain standing balance with use of RW Ambulation/Gait Ambulation/Gait  assistance: Mod assist, +2 physical assistance Assistive device: Rolling walker (2 wheeled) General Gait Details: Patient required modA +2 for steadying to take side steps at  EOB with RW. Patient with difficulty sequencing and required verbal and tactile cues to take steps.    ADL: ADL Overall ADL's : Needs assistance/impaired Eating/Feeding: Set up, Sitting Grooming: Set up, Sitting Grooming Details (indicate cue type and reason): pt washed/dryed face following setupA Upper Body Bathing: Set up, Sitting Upper Body Bathing Details (indicate cue type and reason): pt completed UB bathing following setupA Lower Body Bathing: Moderate assistance, Sit to/from stand Upper Body Dressing : Set up, Sitting Lower Body Dressing: Moderate assistance, Sit to/from stand Toilet Transfer: Moderate assistance, [employer], retail Details (indicate cue type and reason): stand-pivot transfer from EOB to [facility] with RW and modA Toileting- Clothing Manipulation and Hygiene: Maximal assistance, Total assistance Toileting - Clothing Manipulation Details (indicate cue type and reason): pt required maxA-totalA for pericare while standing Functional mobility during ADLs: Moderate assistance, Rolling walker   Cognition: Cognition Overall Cognitive Status: History of cognitive impairments - at baseline Arousal/Alertness: Awake/alert Orientation Level: Oriented to person, Oriented to place, Oriented to situation, Oriented to time Attention: Sustained, Selective Sustained Attention: Appears intact Selective Attention: Impaired Selective Attention Impairment: Verbal complex, Functional basic Memory: Impaired Memory Impairment: (working memory) Awareness: Health visitor Function: Sequencing Sequencing: Impaired Sequencing Impairment: Verbal complex Safety/Judgment: Appears intact Cognition Arousal/Alertness: Awake/alert Behavior During Therapy: WFL for tasks assessed/performed Overall  Cognitive Status: History of cognitive impairments - at baseline Area of Impairment: Problem solving, Safety/judgement, Following commands Following Commands: Follows multi-step commands inconsistently Safety/Judgement: Decreased awareness of safety Problem Solving: Slow processing, Difficulty sequencing, Requires verbal cues, Requires tactile cues General Comments: pt required mod multimodal cues to navigate room with use of RW;required increased time for mobility and processing      Blood pressure 140/69, pulse 91, temperature 98 F (36.7 C), temperature source Oral, resp. rate 16, SpO2 99 %. Physical Exam  Vitals reviewed. Constitutional: He appears well-developed and well-nourished.  Musculoskeletal:     Comments: Large lipoma right medial biceps region.   Neurological: He is alert.  Speech clear but tangential at times. Able to redirect. He was able to follow commands without difficulty.     General: No acute distress Mood and affect are appropriate Heart: Regular rate and rhythm no rubs murmurs or extra sounds Lungs: Clear to auscultation, breathing unlabored, no rales or wheezes Abdomen: Positive bowel sounds, soft nontender to palpation, nondistended Extremities: No clubbing, cyanosis, or edema Skin: No evidence of breakdown, no evidence of rash Neurologic: Cranial nerves II through XII intact, motor strength is 5/5 in bilateral deltoid, bicep, tricep, grip, hip flexor, knee extensors, ankle dorsiflexor and plantar flexor Sensory exam normal sensation to light touch and proprioception in bilateral upper and lower extremities Cerebellar exam normal finger to nose to finger as well as heel to shin in bilateral upper and lower extremities Musculoskeletal: Full range of motion in all 4 extremities. No joint swelling   Lab Results Last 48 Hours        Results for orders placed or performed during the hospital encounter of 05/29/18 (from the past 48 hour(s))  Glucose, capillary      Status: Abnormal    Collection Time: 05/30/18  6:00 PM  Result Value Ref Range    Glucose-Capillary 114 (H) 70 - 99 mg/dL  MRSA PCR Screening     Status: None    Collection Time: 05/30/18  6:49 PM  Result Value Ref Range    MRSA by PCR NEGATIVE NEGATIVE      Comment:  The GeneXpert MRSA Assay (FDA approved for NASAL specimens only), is one component of a comprehensive MRSA colonization surveillance program. It is not intended to diagnose MRSA infection nor to guide or monitor treatment for MRSA infections. Performed at [facility], [place] [address], [place], [place] [address]    Protime-INR     Status: Abnormal    Collection Time: 05/31/18  3:28 AM  Result Value Ref Range    Prothrombin Time 22.4 (H) 11.4 - 15.2 seconds    INR 2.0 (H) 0.8 - 1.2      Comment: (NOTE) INR goal varies based on device and disease states. Performed at [facility], [place] [address], [place] [address]    CBC     Status: None    Collection Time: 05/31/18  3:28 AM  Result Value Ref Range    WBC 10.3 4.0 - 10.5 K/uL    RBC 4.46 4.22 - 5.81 MIL/uL    Hemoglobin 13.2 13.0 - 17.0 g/dL    HCT 40.7 39.0 - 52.0 %    MCV 91.3 80.0 - 100.0 fL    MCH 29.6 26.0 - 34.0 pg    MCHC 32.4 30.0 - 36.0 g/dL    RDW 13.9 11.5 - 15.5 %    Platelets 289 150 - 400 K/uL    nRBC 0.0 0.0 - 0.2 %      Comment: Performed at [facility], [place] [address] [place], [place] [address]  Basic metabolic panel     Status: Abnormal    Collection Time: 05/31/18  3:28 AM  Result Value Ref Range    Sodium 136 135 - 145 mmol/L    Potassium 4.0 3.5 - 5.1 mmol/L    Chloride 99 98 - 111 mmol/L    CO2 28 22 - 32 mmol/L    Glucose, Bld 105 (H) 70 - 99 mg/dL    BUN 14 8 - 23 mg/dL    Creatinine, Ser 0.99 0.61 - 1.24 mg/dL    Calcium 9.2 8.9 - 10.3 mg/dL    GFR calc non Af Amer >60 >60 mL/min    GFR calc Af Amer >60 >60 mL/min    Anion gap 9 5 - 15      Comment: Performed at [facility],  [place] [address], [place], [place] [address]  Glucose, capillary     Status: Abnormal    Collection Time: 05/31/18  4:56 PM  Result Value Ref Range    Glucose-Capillary 116 (H) 70 - 99 mg/dL  Glucose, capillary     Status: Abnormal    Collection Time: 05/31/18 10:00 PM  Result Value Ref Range    Glucose-Capillary 160 (H) 70 - 99 mg/dL  Protime-INR     Status: Abnormal    Collection Time: 06/01/18  3:23 AM  Result Value Ref Range    Prothrombin Time 35.3 (H) 11.4 - 15.2 seconds    INR 3.6 (H) 0.8 - 1.2      Comment: (NOTE) INR goal varies based on device and disease states. Performed at [facility], [place] [address] [place],  [address]    CBC with Differential/Platelet     Status: Abnormal    Collection Time: 06/01/18  3:23 AM  Result Value Ref Range    WBC 11.6 (H) 4.0 - 10.5 K/uL    RBC 4.40 4.22 - 5.81 MIL/uL    Hemoglobin 13.0 13.0 - 17.0 g/dL    HCT 40.2  39.0 - 52.0 %    MCV 91.4 80.0 - 100.0 fL    MCH 29.5 26.0 - 34.0 pg    MCHC 32.3 30.0 - 36.0 g/dL    RDW 14.6 11.5 - 15.5 %    Platelets 315 150 - 400 K/uL    nRBC 0.0 0.0 - 0.2 %    Neutrophils Relative % 65 %    Neutro Abs 7.6 1.7 - 7.7 K/uL    Lymphocytes Relative 18 %    Lymphs Abs 2.1 0.7 - 4.0 K/uL    Monocytes Relative 11 %    Monocytes Absolute 1.3 (H) 0.1 - 1.0 K/uL    Eosinophils Relative 4 %    Eosinophils Absolute 0.5 0.0 - 0.5 K/uL    Basophils Relative 1 %    Basophils Absolute 0.1 0.0 - 0.1 K/uL    Immature Granulocytes 1 %    Abs Immature Granulocytes 0.11 (H) 0.00 - 0.07 K/uL      Comment: Performed at [facility] [address], [place], [place] [address]  Comprehensive metabolic panel     Status: Abnormal    Collection Time: 06/01/18  3:23 AM  Result Value Ref Range    Sodium 133 (L) 135 - 145 mmol/L    Potassium 4.0 3.5 - 5.1 mmol/L    Chloride 98 98 - 111 mmol/L    CO2 26 22 - 32 mmol/L    Glucose, Bld 121 (H) 70 - 99 mg/dL    BUN 20 8 - 23 mg/dL    Creatinine, Ser 1.19  0.61 - 1.24 mg/dL    Calcium 9.2 8.9 - 10.3 mg/dL    Total Protein 6.4 (L) 6.5 - 8.1 g/dL    Albumin 3.6 3.5 - 5.0 g/dL    AST 20 15 - 41 U/L    ALT 16 0 - 44 U/L    Alkaline Phosphatase 71 38 - 126 U/L    Total Bilirubin 0.4 0.3 - 1.2 mg/dL    GFR calc non Af Amer 57 (L) >60 mL/min    GFR calc Af Amer >60 >60 mL/min    Anion gap 9 5 - 15      Comment: Performed at [facility] [address], [place], [place] [address]  Magnesium     Status: None    Collection Time: 06/01/18  3:23 AM  Result Value Ref Range    Magnesium 1.9 1.7 - 2.4 mg/dL      Comment: Performed at [facility], [place] [address], [place], [place] [address]  Vitamin B12     Status: None    Collection Time: 06/01/18  3:23 AM  Result Value Ref Range    Vitamin B-12 591 180 - 914 pg/mL      Comment: (NOTE) This assay is not validated for testing neonatal or myeloproliferative syndrome specimens for Vitamin B12 levels. Performed at [facility], [place] [address], [place], [place] [address]    TSH     Status: None    Collection Time: 06/01/18  3:23 AM  Result Value Ref Range    TSH 3.023 0.350 - 4.500 uIU/mL      Comment: Performed by a 3rd Generation assay with a functional sensitivity of <=0.01 uIU/mL. Performed at [facility], [place] [address] [place],  [address]    Hemoglobin A1c     Status: Abnormal    Collection Time: 06/01/18  3:23 AM  Result Value Ref  Range    Hgb A1c MFr Bld 6.7 (H) 4.8 - 5.6 %      Comment: (NOTE) Pre diabetes:          5.7%-6.4% Diabetes:              >6.4% Glycemic control for   <7.0% adults with diabetes      Mean Plasma Glucose 145.59 mg/dL      Comment: Performed at [facility] [address], [place], [place] [address]  Glucose, capillary     Status: Abnormal    Collection Time: 06/01/18  7:54 AM  Result Value Ref Range    Glucose-Capillary 124 (H) 70 - 99 mg/dL  Glucose, capillary     Status: Abnormal    Collection Time: 06/01/18  12:02 PM  Result Value Ref Range    Glucose-Capillary 166 (H) 70 - 99 mg/dL       Imaging Results (Last 48 hours)  Vas US Carotid (at [place] Only)   Result Date: 05/30/2018 Carotid Arterial Duplex Study Indications: TIA and Speech disturbance. Performing Technologist: Maudry Mayhew MHA, RDMS, RVT, RDCS  Examination Guidelines: A complete evaluation includes B-mode imaging, spectral Doppler, color Doppler, and power Doppler as needed of all accessible portions of each vessel. Bilateral testing is considered an integral part of a complete examination. Limited examinations for reoccurring indications may be performed as noted.  Right Carotid Findings: +----------+-------+-------+--------+---------------------------------+--------+             PSV     EDV     Stenosis Describe                          Comments              cm/s    cm/s                                                         +----------+-------+-------+--------+---------------------------------+--------+  CCA Prox   154     18               irregular and heterogenous                  +----------+-------+-------+--------+---------------------------------+--------+  CCA Distal 143     31               heterogenous and calcific                   +----------+-------+-------+--------+---------------------------------+--------+  ICA Prox   173     28               irregular, heterogenous and                                                      calcific                                    +----------+-------+-------+--------+---------------------------------+--------+  ICA Distal 101     24                                                           +----------+-------+-------+--------+---------------------------------+--------+  ECA        144     14               heterogenous, calcific and                                                       irregular                                    +----------+-------+-------+--------+---------------------------------+--------+ +----------+--------+-------+----------------+-------------------+             PSV cm/s EDV cms Describe         Arm Pressure (mmHG)  +----------+--------+-------+----------------+-------------------+  Subclavian 57               Multiphasic, WNL                      +----------+--------+-------+----------------+-------------------+ +---------+--------+--+--------+--+---------+  Vertebral PSV cm/s 46 EDV cm/s 13 Antegrade  +---------+--------+--+--------+--+---------+  Left Carotid Findings: +----------+-------+-------+--------+---------------------------------+--------+             PSV     EDV     Stenosis Describe                          Comments              cm/s    cm/s                                                         +----------+-------+-------+--------+---------------------------------+--------+  CCA Prox   111     16               heterogenous and smooth                     +----------+-------+-------+--------+---------------------------------+--------+  CCA Distal 72      24               smooth and heterogenous                     +----------+-------+-------+--------+---------------------------------+--------+  ICA Prox   75      20                                                           +----------+-------+-------+--------+---------------------------------+--------+  ICA Distal 120     26                                                           +----------+-------+-------+--------+---------------------------------+--------+  ECA        97      12  heterogenous, calcific and                                                       irregular                                   +----------+-------+-------+--------+---------------------------------+--------+ +----------+--------+--------+----------------+-------------------+  Subclavian PSV cm/s EDV cm/s Describe         Arm Pressure (mmHG)   +----------+--------+--------+----------------+-------------------+             248               Multiphasic, WNL                      +----------+--------+--------+----------------+-------------------+ +---------+--------+--+--------+--+---------+  Vertebral PSV cm/s 54 EDV cm/s 11 Antegrade  +---------+--------+--+--------+--+---------+  Summary: Right Carotid: Velocities in the right ICA are consistent with a 1-39% stenosis. Left Carotid: Velocities in the left ICA are consistent with a 1-39% stenosis. Vertebrals:  Bilateral vertebral arteries demonstrate antegrade flow. Subclavians: Normal flow hemodynamics were seen in bilateral subclavian              arteries. *See table(s) above for measurements and observations.  Electronically signed by Antony Contras MD on 05/30/2018 at 5:56:53 PM.    Final              Medical Problem List and Plan: 1.    Gait disorder and mental status changes secondary to normal pressure hydrocephalus 2.  Antithrombotics: -DVT/anticoagulation:  Pharmaceutical: Coumadin             -antiplatelet therapy: N/a 3. Pain Management: tylenol prn.  4. Mood: LCSW to follow for evaluation and support.              -antipsychotic agents: N/A 5. Neuropsych: This patient is not capable of making decisions on his  own behalf. 6. Skin/Wound Care: Routine pressure relief measures.  7. Fluids/Electrolytes/Nutrition: Monitor I/O. Check lytes in am.  8. H/o CVA/NPH: On coumadin.  9. Paroxymal Afib: Monitor HR bid. Metoprolol resumed today. Continue coumadin. 10. HTN: Monitor BP bid--check orthostatic BP.  On metoprolol and Imdur.  Continue to hold Lasix, Losartan and catapres.   11. H/O seizures: On tegretol BID. Repeat levels today-9.2.  12. T2DM:  Monitor BS ac/hs--continue to use SSi for elevated BS. Blood sugars poorly controlled-- will resume metformin and titrate as indicated.  13. CAD: On Lipitor, Imdur and metoprolol.  11. Leucocytosis: Fluctuating WBC--will recheck CBC in  am. Was in process of completing treatment for bronchitis. Monitor for fevers and other signs of infection. Check UA/UCS to rule out UTI as cause of worsening of confusion.  12. Hyponatremia: Chronic? Off Lexapro now. Monitor with serial checks.        Post Admission Physician Evaluation: 1. Functional deficits secondary  to Normal Pressure Hydrocephalus. 2. Patient admitted to receive collaborative, interdisciplinary care between the physiatrist, rehab nursing staff, and therapy team. 3. Patient's level of medical complexity and substantial therapy needs in context of that medical necessity cannot be provided at a lesser intensity of care. 4. Patient has experienced substantial functional loss from his/her baseline.Judging by the patient's diagnosis, physical exam, and functional history, the patient has potential for functional progress which will result in measurable  gains while on inpatient rehab.  These gains will be of substantial and practical use upon discharge in facilitating mobility and self-care at the household level. 5. Physiatrist will provide 24 hour management of medical needs as well as oversight of the therapy plan/treatment and provide guidance as appropriate regarding the interaction of the two. 6. 24 hour rehab nursing will assist in the management of  bladder management, bowel management, safety, skin/wound care, disease management, medication administration, pain management and patient education  and help integrate therapy concepts, techniques,education, etc. 7. PT will assess and treat for:pre gait, gait training, endurance , safety, equipment, neuromuscular re education  .  Goals are: independent with assistive device. 8. OT will assess and treat for ADLs, Cognitive perceptual skills, Neuromuscular re education, safety, endurance, equipment  .  Goals are: independent with increased time.  9. SLP will assess and treat for memory, attention, concentration, medication management   .  Goals are: supervision. 10. Case Management and Social Worker will assess and treat for psychological issues and discharge planning. 11. Team conference will be held weekly to assess progress toward goals and to determine barriers to discharge. 12.  Patient will receive at least 3 hours of therapy per day at least 5 days per week. 13. ELOS and Prognosis: 7 to 10 days excellent  "I have personally performed a face to face diagnostic evaluation of this patient.  Additionally, I have reviewed and concur with the physician assistant's documentation above."  Charlett Blake M.D. [facility] FAAPM&R (Sports Med, Neuromuscular Med) Diplomate [place] Board of [place], PA-C 06/01/2018

## 2018-06-01 NOTE — Progress Notes (Signed)
Report given to Nurse on 4W.

## 2018-06-01 NOTE — Progress Notes (Signed)
ANTICOAGULATION CONSULT NOTE - Follow Up Consult  Pharmacy Consult for Coumadin Indication: atrial fibrillation  No Known Allergies  Patient Measurements:    Vital Signs: Temp: 98 F (36.7 C) (03/12 0555) Temp Source: Oral (03/12 0555) BP: 85/66 (03/12 0555) Pulse Rate: 91 (03/12 0555)  Labs: Recent Labs    05/30/18 0326 05/31/18 0328 06/01/18 0323  HGB 13.0 13.2 13.0  HCT 39.2 40.7 40.2  PLT 272 289 315  LABPROT 18.8* 22.4* 35.3*  INR 1.6* 2.0* 3.6*  CREATININE 1.13 0.99 1.19    Estimated Creatinine Clearance: 51.8 mL/min (by C-G formula based on SCr of 1.19 mg/dL).   Assessment:  Anticoag: Coumadin for Afib; admit INR below goal w/ last dose taken 3/8 (missed 3/9?).    - Home dose 7.5mg  daily  except 11.25 MWF (admit INR 1.6 but missed dose 3/9)  INR supratherapeutic at 3.6 s/p 15mg  and 11.25mg  doses in last 48 hours with INR from 1.6>3.6 despite missed dose on 9th, CBC remains wnl  Goal of Therapy:  INR 2-3 Monitor platelets by anticoagulation protocol: Yes   Plan:  Will hold warfarin tonight Daily INR, s/s bleeding  Bertis Ruddy, PharmD Clinical Pharmacist Please check AMION for all [facility] numbers 06/01/2018 10:33 AM

## 2018-06-02 ENCOUNTER — Inpatient Hospital Stay (HOSPITAL_COMMUNITY): Payer: Medicare Other | Admitting: Physical Therapy

## 2018-06-02 ENCOUNTER — Inpatient Hospital Stay (HOSPITAL_COMMUNITY): Payer: Medicare Other

## 2018-06-02 ENCOUNTER — Inpatient Hospital Stay (HOSPITAL_COMMUNITY): Payer: Medicare Other | Admitting: Speech Pathology

## 2018-06-02 DIAGNOSIS — E119 Type 2 diabetes mellitus without complications: Secondary | ICD-10-CM

## 2018-06-02 DIAGNOSIS — G912 (Idiopathic) normal pressure hydrocephalus: Secondary | ICD-10-CM

## 2018-06-02 DIAGNOSIS — I10 Essential (primary) hypertension: Secondary | ICD-10-CM

## 2018-06-02 DIAGNOSIS — E871 Hypo-osmolality and hyponatremia: Secondary | ICD-10-CM

## 2018-06-02 LAB — CBC WITH DIFFERENTIAL/PLATELET
Abs Immature Granulocytes: 0.14 10*3/uL — ABNORMAL HIGH (ref 0.00–0.07)
Basophils Absolute: 0.1 10*3/uL (ref 0.0–0.1)
Basophils Relative: 1 %
Eosinophils Absolute: 0.6 10*3/uL — ABNORMAL HIGH (ref 0.0–0.5)
Eosinophils Relative: 5 %
HCT: 40.7 % (ref 39.0–52.0)
Hemoglobin: 13.4 g/dL (ref 13.0–17.0)
Immature Granulocytes: 1 %
Lymphocytes Relative: 20 %
Lymphs Abs: 2.5 10*3/uL (ref 0.7–4.0)
MCH: 30.2 pg (ref 26.0–34.0)
MCHC: 32.9 g/dL (ref 30.0–36.0)
MCV: 91.9 fL (ref 80.0–100.0)
Monocytes Absolute: 1.2 10*3/uL — ABNORMAL HIGH (ref 0.1–1.0)
Monocytes Relative: 10 %
Neutro Abs: 7.7 10*3/uL (ref 1.7–7.7)
Neutrophils Relative %: 63 %
Platelets: 325 10*3/uL (ref 150–400)
RBC: 4.43 MIL/uL (ref 4.22–5.81)
RDW: 14.1 % (ref 11.5–15.5)
WBC: 12.3 10*3/uL — ABNORMAL HIGH (ref 4.0–10.5)
nRBC: 0 % (ref 0.0–0.2)

## 2018-06-02 LAB — FOLATE RBC
Folate, Hemolysate: >620 ng/mL
Folate, RBC: >1610 ng/mL (ref >498)
Hematocrit: 38.5 % (ref 37.5–51.0)

## 2018-06-02 LAB — COMPREHENSIVE METABOLIC PANEL
ALT: 22 U/L (ref 0–44)
AST: 26 U/L (ref 15–41)
Albumin: 3.5 g/dL (ref 3.5–5.0)
Alkaline Phosphatase: 66 U/L (ref 38–126)
Anion gap: 9 (ref 5–15)
BUN: 19 mg/dL (ref 8–23)
CO2: 27 mmol/L (ref 22–32)
Calcium: 9 mg/dL (ref 8.9–10.3)
Chloride: 100 mmol/L (ref 98–111)
Creatinine, Ser: 1.18 mg/dL (ref 0.61–1.24)
GFR calc Af Amer: >60 mL/min (ref >60)
GFR calc non Af Amer: 57 mL/min — ABNORMAL LOW (ref >60)
Glucose, Bld: 127 mg/dL — ABNORMAL HIGH (ref 70–99)
Potassium: 4.1 mmol/L (ref 3.5–5.1)
Sodium: 136 mmol/L (ref 135–145)
Total Bilirubin: 0.6 mg/dL (ref 0.3–1.2)
Total Protein: 6.2 g/dL — ABNORMAL LOW (ref 6.5–8.1)

## 2018-06-02 LAB — PROTIME-INR
INR: 2.9 — ABNORMAL HIGH (ref 0.8–1.2)
Prothrombin Time: 30 seconds — ABNORMAL HIGH (ref 11.4–15.2)

## 2018-06-02 LAB — GLUCOSE, CAPILLARY
Glucose-Capillary: 109 mg/dL — ABNORMAL HIGH (ref 70–99)
Glucose-Capillary: 132 mg/dL — ABNORMAL HIGH (ref 70–99)
Glucose-Capillary: 148 mg/dL — ABNORMAL HIGH (ref 70–99)
Glucose-Capillary: 80 mg/dL (ref 70–99)

## 2018-06-02 MED ORDER — LOSARTAN POTASSIUM 50 MG PO TABS
25.0000 mg | ORAL_TABLET | Freq: Every day | ORAL | Status: DC
  Administered 2018-06-02 – 2018-06-04 (×3): 25 mg via ORAL
  Filled 2018-06-02 (×3): qty 1

## 2018-06-02 MED ORDER — WARFARIN SODIUM 10 MG PO TABS
11.2500 mg | ORAL_TABLET | Freq: Once | ORAL | Status: AC
  Administered 2018-06-02: 11.25 mg via ORAL
  Filled 2018-06-02: qty 1

## 2018-06-02 NOTE — Evaluation (Signed)
Occupational Therapy Assessment and Plan  Patient Details  Name: [name] MRN: [id number] Date of Birth: [birth year]  OT Diagnosis: abnormal posture, cognitive deficits, hemiplegia affecting dominant side and muscle weakness (generalized) Rehab Potential:   ELOS: 12-16   Today's Date: 06/02/2018 OT Individual Time: 0700-0800 OT Individual Time Calculation (min): 60 min     Problem List:  Patient Active Problem List   Diagnosis Date Noted  . Normal pressure hydrocephalus ([place]) 06/01/2018  . Altered mental status 05/31/2018  . AMS (altered mental status) 05/30/2018  . Seizure disorder ([place]) 05/30/2018  . Fracture of proximal phalanx of finger fifth 06/17/2017 07/21/2017  . Upper airway cough syndrome 11/01/2016  . Atrial flutter ([place]) 08/05/2016  . Coronary artery disease involving native coronary artery of native heart without angina pectoris 08/05/2016  . Chronic anticoagulation 12/31/2015  . History of seizures 12/31/2015  . Acute diastolic CHF (congestive heart failure) ([place]) 12/24/2015  . Pulmonary edema   . Paroxysmal atrial fibrillation ([facility])   . Hypothyroidism   . Controlled type 2 diabetes mellitus with complication, without long-term current use of insulin ([facility])   . Hypertensive emergency 12/20/2015  . Special screening for malignant neoplasms, colon 12/07/2013  . Bowel habit changes 12/07/2013  . Diarrhea 03/09/2013  . History of cardioembolic cerebrovascular accident (CVA) 01/31/2013  . Chest pain 01/29/2013  . Bilateral carotid artery disease ([place])   . Loss of coordination 12/03/2012  . Morbid obesity due to excess calories ([place]) 08/20/2011  . CAD S/P percutaneous coronary angioplasty   . Dyslipidemia   . Hypertension     Past Medical History:  Past Medical History:  Diagnosis Date  . Atrial flutter ([place])   . BPH (benign prostatic hyperplasia)   . Coronary artery disease    a. s/p MI tx with Cypher DES to pRCA in 4/04;  b. Echocardiogram 7/09: Normal  LV function.  c. Nuclear study 3/13 no ischemia;  d. ETT 2/14 neg;  e. admx with CP => [facility] (01/30/2013):  pLAD 40-50%, oD1 70-80%, oOM1 40%, pRCA stent patent, mid RCA 30%, EF 60-65%. => med Rx.  . DDD (degenerative disc disease)   . Diabetes mellitus without complication ([place])   . Dyslipidemia   . Hemorrhoids   . Hx MRSA infection    left buttocks abscess  . Hx of echocardiogram    a. Echocardiogram (01/31/2013): Mild focal basal and mild concentric hypertrophy of the septum, EF 50-55%, normal wall motion, grade 1 diastolic dysfunction, trivial AI, MAC, mild LAE, PASP 35  . Hyperlipidemia   . Hypertension   . Hypothyroidism   . Meniere's disease    Status post shunt  . Myocardial infarction ([place]) 2008  . Occlusion and stenosis of carotid artery without mention of cerebral infarction    40-59% on carotid doppler 2014; [place] (01/2013): R 1-39%, L 60-79%  . Rotator cuff injury    chronic rotator cuff injury status post repair  . Seizure disorder ([place])   . Stroke [facility])    a. 01/2013=> post cardiac cath CVA to L post communicating artery system; R sided weakness  . Syncope    Past Surgical History:  Past Surgical History:  Procedure Laterality Date  . COLONOSCOPY    . CORONARY ANGIOPLASTY WITH STENT PLACEMENT  07/14/2002   Stent to the right coronary artery  . ENDOLYMPHATIC SHUNT DECOMPRESSION  06/11/2009    Right endolymphatic sac decompression and shunt  placement  . HERNIA REPAIR  04/10/2008   scrotal hernia repair  .  LEFT HEART CATHETERIZATION WITH CORONARY ANGIOGRAM N/A 01/30/2013   Procedure: LEFT HEART CATHETERIZATION WITH CORONARY ANGIOGRAM;  Surgeon: Larey Dresser, MD;  Location: [facility] C[facility];  Service: Cardiovascular;  Laterality: N/A;  . ROTATOR CUFF REPAIR     for chronic rotator cuff injury    Assessment & Plan Clinical Impression:Patient is 85 y/o male presenting to hospital with AMS and slurred speech. MRI negative for acute findings. PMH includes CAD s/p PCI, HTN,  CVA, dCHF, paroxysmal afib, meniere's disease, seizures, and MI.   Patient currently requires mod with basic self-care skills secondary to muscle weakness, decreased cardiorespiratoy endurance, motor apraxia, decreased coordination and decreased motor planning, decreased attention to right and decreased motor planning, decreased attention, decreased awareness, decreased problem solving, decreased safety awareness, decreased memory and delayed processing and decreased sitting balance, decreased standing balance, decreased postural control, hemiplegia and decreased balance strategies.  Prior to hospitalization, patient could complete BADL with supervision.  Patient will benefit from skilled intervention to decrease level of assist with basic self-care skills and increase independence with basic self-care skills prior to discharge home with care partner.  Anticipate patient will require 24 hour supervision and follow up home health.  OT - End of Session Activity Tolerance: Tolerates 30+ min activity with multiple rests Endurance Deficit: Yes OT Assessment Rehab Potential (ACUTE ONLY): Good OT Barriers to Discharge: Decreased caregiver support;Lack of/limited family support OT Barriers to Discharge Comments: unsure if family able to provide care needed at d/c OT Patient demonstrates impairments in the following area(s): Balance;Cognition;Endurance;Motor;Pain;Perception;Safety;Sensory OT Basic ADL's Functional Problem(s): Grooming;Bathing;Dressing;Toileting OT Transfers Functional Problem(s): Toilet;Tub/Shower OT Additional Impairment(s): None OT Plan OT Intensity: Minimum of 1-2 x/day, 45 to 90 minutes OT Frequency: 5 out of 7 days OT Duration/Estimated Length of Stay: 12-16 OT Treatment/Interventions: Balance/vestibular training;Community reintegration;Disease mangement/prevention;Neuromuscular re-education;Patient/family education;Self Care/advanced ADL retraining;Splinting/orthotics;UE/LE  Coordination activities;Therapeutic Exercise;Wheelchair propulsion/positioning;Cognitive remediation/compensation;Discharge planning;DME/adaptive equipment instruction;Functional mobility training;Pain management;Psychosocial support;Skin care/wound managment;Therapeutic Activities;UE/LE Strength taining/ROM;Visual/perceptual remediation/compensation OT Self Feeding Anticipated Outcome(s): S OT Basic Self-Care Anticipated Outcome(s): S OT Toileting Anticipated Outcome(s): S OT Bathroom Transfers Anticipated Outcome(s): S OT Recommendation Patient destination: Home Follow Up Recommendations: Home health OT Equipment Recommended: Tub/shower seat Equipment Details: pt report unreliable; need to confirm home euipment from family   Skilled Therapeutic Intervention 1:1. Pt educated on role/purpose of OT, CIR, ELOS, POC and goal setting. Pt supine>sitting with S and bedrails. Pt completes stand pivot transfer and sit to stand with MOD A for power up into standing. Pt completes bathing and dressing at sit to stand level at sink (see below for detials). Pt requires increased time for processing and VC for orientation of clothing. Pt requires stands at sink with MOD A sit to stand and min A for static standing balance while grooming with unilateral support on sink. Exited session with pt seated in bed, exit alarm on and call light in reach.  OT Evaluation Precautions/Restrictions  Precautions Precautions: Fall Restrictions Weight Bearing Restrictions: No General Chart Reviewed: Yes Family/Caregiver Present: No Vital Signs Therapy Vitals Temp: 98 F (36.7 C) Pulse Rate: 66 Resp: 19 BP: (!) 170/83 Patient Position (if appropriate): Lying Oxygen Therapy SpO2: 96 % O2 Device: Room Air Pain Pain Assessment Pain Score: 0-No pain Home Living/Prior Functioning Home Living Family/patient expects to be discharged to:: Private residence Living Arrangements: Spouse/significant other,  Children Available Help at Discharge: Family, Available 24 hours/day Type of Home: House Home Access: Stairs to enter [employer] of Steps: 2 Entrance Stairs-Rails: Right, Left, Can reach both Home  Layout: One level Bathroom Shower/Tub: Tub/shower unit, Gaffer, Door, Theatre stage manager Accessibility: Yes Additional Comments: wife with new dx dementia, Maudie Mercury , daughter prefers CIR  Lives With: Spouse Prior Function Vocation: Retired Comments: family members report pt has been deconditioning more in previous 2 weeks ADL ADL Eating: Modified independent Grooming: Minimal assistance Where Assessed-Grooming: Standing at sink Upper Body Bathing: Supervision/safety Where Assessed-Upper Body Bathing: Wheelchair, Sitting at sink Lower Body Bathing: Minimal assistance Where Assessed-Lower Body Bathing: Sitting at sink, Standing at sink, Wheelchair Upper Body Dressing: Supervision/safety Where Assessed-Upper Body Dressing: Sitting at sink, Wheelchair Lower Body Dressing: Minimal assistance Where Assessed-Lower Body Dressing: Sitting at sink, Standing at sink, Medical laboratory scientific officer: Moderate assistance Toilet Transfer Method: Stand pivot Vision Baseline Vision/History: Wears glasses Wears Glasses: At all times Patient Visual Report: No change from baseline Vision Assessment?: Yes Eye Alignment: Within Functional Limits Ocular Range of Motion: Within Functional Limits Tracking/Visual Pursuits: Able to track stimulus in all quads without difficulty Saccades: Within functional limits Perception  Perception: Impaired Praxis Praxis: Impaired Praxis Impairment Details: Motor planning Praxis-Other Comments: RUE-decreased coordination and motor planning from CVA PTA Cognition Overall Cognitive Status: History of cognitive impairments - at baseline Arousal/Alertness: Awake/alert Orientation Level: Person;Situation;Place Person: Oriented Place: Oriented Situation:  Oriented Year: 2020 Month: March Day of Week: Correct Memory: Impaired Immediate Memory Recall: Sock;Blue;Bed Memory Recall: (able to select from field of 2) Sustained Attention: Appears intact Selective Attention: Impaired Safety/Judgment: Impaired Sensation Sensation Light Touch: Appears Intact Coordination Gross Motor Movements are Fluid and Coordinated: No Fine Motor Movements are Fluid and Coordinated: No Motor  Motor Motor: Hemiplegia Motor - Skilled Clinical Observations:  R hemi from CVA PTA Mobility  Transfers Sit to Stand: Moderate Assistance - Patient 50-74% Stand to Sit: Minimal Assistance - Patient > 75%  Trunk/Postural Assessment  Cervical Assessment Cervical Assessment: Exceptions to WFL(head forward) Thoracic Assessment Thoracic Assessment: Exceptions to WFL(rounded shoudlers) Lumbar Assessment Lumbar Assessment: Exceptions to WFL(post pelvic tilt) Postural Control Postural Control: Deficits on evaluation(insufficient)  Balance Balance Balance Assessed: Yes Static Sitting Balance Static Sitting - Balance Support: Bilateral upper extremity supported;Feet supported Static Sitting - Level of Assistance: 5: Stand by assistance Dynamic Standing Balance Dynamic Standing - Balance Support: During functional activity Dynamic Standing - Level of Assistance: 3: Mod assist Dynamic Standing - Comments: dressing Extremity/Trunk Assessment RUE Assessment RUE Assessment: Exceptions to [facility] General Strength Comments: 3+/5- generalized weakness and decreased coordination at baseline from CVA PTA LUE Assessment LUE Assessment: Exceptions to [facility] General Strength Comments: generalized weakness     Refer to Care Plan for Long Term Goals  Recommendations for other services: None    Discharge Criteria: Patient will be discharged from OT if patient refuses treatment 3 consecutive times without medical reason, if treatment goals not met, if there is a change in medical  status, if patient makes no progress towards goals or if patient is discharged from hospital.  The above assessment, treatment plan, treatment alternatives and goals were discussed and mutually agreed upon: by patient  [name] 06/02/2018, 7:30 AM

## 2018-06-02 NOTE — Progress Notes (Signed)
[facility] PHYSICAL MEDICINE & REHABILITATION PROGRESS NOTE   Subjective/Complaints: Had a fair night. Anxious to get home  ROS: Patient denies fever, rash, sore throat, blurred vision, nausea, vomiting, diarrhea, cough, shortness of breath or chest pain, joint or back pain, headache, or mood change.    Objective:   No results found. Recent Labs    06/01/18 0323 06/02/18 0554  WBC 11.6* 12.3*  HGB 13.0 13.4  HCT 40.2 40.7  PLT 315 325   Recent Labs    06/01/18 0323 06/02/18 0554  NA 133* 136  K 4.0 4.1  CL 98 100  CO2 26 27  GLUCOSE 121* 127*  BUN 20 19  CREATININE 1.19 1.18  CALCIUM 9.2 9.0    Intake/Output Summary (Last 24 hours) at 06/02/2018 0939 Last data filed at 06/02/2018 0700 Gross per 24 hour  Intake 480 ml  Output -  Net 480 ml     Physical Exam: Vital Signs Blood pressure (!) 170/83, pulse 66, temperature 98 F (36.7 C), resp. rate 19, weight 91.4 kg, SpO2 96 %. Constitutional: No distress . Vital signs reviewed. HEENT: EOMI, oral membranes moist Neck: supple Cardiovascular: RRR without murmur. No JVD    Respiratory: CTA Bilaterally without wheezes or rales. Normal effort    GI: BS +, non-tender, non-distended    Musculoskeletal:  Comments: Large lipoma right medial biceps region. Neurological: He is alert. Sl tangential fair insight and awareness.  CN intact Motor is 5/5 in bilateral deltoid, bicep, tricep, grip, hip flexor, knee extensors, ankle dorsiflexor and plantar flexor. Sensory exam normal. No tremor/tone Psych: a little anxious but pleasant   Assessment/Plan: 1. Functional deficits secondary to NPH which require 3+ hours per day of interdisciplinary therapy in a comprehensive inpatient rehab setting.  Physiatrist is providing close team supervision and 24 hour management of active medical problems listed below.  Physiatrist and rehab team continue to assess barriers to discharge/monitor patient progress toward functional  and medical goals  Care Tool:  Bathing  Bathing activity did not occur: Safety/medical concerns           Bathing assist       Upper Body Dressing/Undressing Upper body dressing   What is the patient wearing?: Hospital gown only    Upper body assist Assist Level: Minimal Assistance - Patient > 75%    Lower Body Dressing/Undressing Lower body dressing      What is the patient wearing?: Underwear/pull up     Lower body assist Assist for lower body dressing: Moderate Assistance - Patient 50 - 74%     Toileting Toileting    Toileting assist Assist for toileting: Moderate Assistance - Patient 50 - 74%     Transfers Chair/bed transfer  Transfers assist  Chair/bed transfer activity did not occur: Safety/medical concerns  Chair/bed transfer assist level: Moderate Assistance - Patient 50 - 74%     Locomotion Ambulation   Ambulation assist              Walk 10 feet activity   Assist           Walk 50 feet activity   Assist           Walk 150 feet activity   Assist           Walk 10 feet on uneven surface  activity   Assist           Wheelchair     Assist  Wheelchair 50 feet with 2 turns activity    Assist            Wheelchair 150 feet activity     Assist          Medical Problem List and Plan: 1.   Gait disorder and mental status changes secondary to normal pressure hydrocephalus  -beginning therapies today 2. Antithrombotics: -DVT/anticoagulation:Pharmaceutical:Coumadin INR 2.9 -antiplatelet therapy: N/a 3. Pain Management:tylenol prn. 4. Mood:LCSW to follow for evaluation and support. -antipsychotic agents: N/A 5. Neuropsych: This patientis notyet capable of making decisions on hisown behalf. 6. Skin/Wound Care:Routine pressure relief measures. 7. Fluids/Electrolytes/Nutrition: encourage PO  -I personally reviewed the patient's labs  today.  WNL  8. H/o CVA/NPH: On coumadin.  9. Paroxymal Afib: Monitor HR bid. Metoprolol resumed today. Continue coumadin.  -HR controlled 10. HTN: Monitor BP bid--check orthostatic BP. On metoprolol and Imdur.  -bp climbing  -resume losartan today at 25mg  daily  Continue to hold Lasix,   andcatapres. 11. H/O seizures: On tegretol BID.Repeat levels 9.2. 12. T2DM: Monitor BS ac/hs--continue to use SSi for elevated BS. Blood sugars poorly controlled-- will resume metformin and titrate as indicated  -fair control at present.  13. CAD: On Lipitor, Imdur and metoprolol.  11. Leucocytosis: wbc's sl up to 12.3 today  -ua neg to equivocal, ucx pending  -no other signs of infection on exam  -continue to monitor  -recheck cbc monday 12. Hyponatremia: Chronic? Off Lexapro now.    -improved to 136 today.    LOS: 1 days A FACE TO FACE EVALUATION WAS PERFORMED  Meredith Staggers 06/02/2018, 9:39 AM

## 2018-06-02 NOTE — Progress Notes (Signed)
Social Work  Social Work Assessment and Plan  Patient Details  Name: [name] MRN: [id number] Date of Birth: [birth year]  Today's Date: 06/02/2018  Problem List:  Patient Active Problem List   Diagnosis Date Noted  . Normal pressure hydrocephalus ([place]) 06/01/2018  . Altered mental status 05/31/2018  . AMS (altered mental status) 05/30/2018  . Seizure disorder ([place]) 05/30/2018  . Fracture of proximal phalanx of finger fifth 06/17/2017 07/21/2017  . Upper airway cough syndrome 11/01/2016  . Atrial flutter ([place]) 08/05/2016  . Coronary artery disease involving native coronary artery of native heart without angina pectoris 08/05/2016  . Chronic anticoagulation 12/31/2015  . History of seizures 12/31/2015  . Acute diastolic CHF (congestive heart failure) ([place]) 12/24/2015  . Pulmonary edema   . Paroxysmal atrial fibrillation (HCC)   . Hypothyroidism   . Controlled type 2 diabetes mellitus with complication, without long-term current use of insulin ([place])   . Hypertensive emergency 12/20/2015  . Special screening for malignant neoplasms, colon 12/07/2013  . Bowel habit changes 12/07/2013  . Diarrhea 03/09/2013  . History of cardioembolic cerebrovascular accident (CVA) 01/31/2013  . Chest pain 01/29/2013  . Bilateral carotid artery disease ([place])   . Loss of coordination 12/03/2012  . Morbid obesity due to excess calories ([place]) 08/20/2011  . CAD S/P percutaneous coronary angioplasty   . Dyslipidemia   . Hypertension    Past Medical History:  Past Medical History:  Diagnosis Date  . Atrial flutter ([place])   . BPH (benign prostatic hyperplasia)   . Coronary artery disease    a. s/p MI tx with Cypher DES to pRCA in 4/04;  b. Echocardiogram 7/09: Normal LV function.  c. Nuclear study 3/13 no ischemia;  d. ETT 2/14 neg;  e. admx with CP => LHC (01/30/2013):  pLAD 40-50%, oD1 70-80%, oOM1 40%, pRCA stent patent, mid RCA 30%, EF 60-65%. => med Rx.  . DDD (degenerative disc disease)   .  Diabetes mellitus without complication ([place])   . Dyslipidemia   . Hemorrhoids   . Hx MRSA infection    left buttocks abscess  . Hx of echocardiogram    a. Echocardiogram (01/31/2013): Mild focal basal and mild concentric hypertrophy of the septum, EF 50-55%, normal wall motion, grade 1 diastolic dysfunction, trivial AI, MAC, mild LAE, PASP 35  . Hyperlipidemia   . Hypertension   . Hypothyroidism   . Meniere's disease    Status post shunt  . Myocardial infarction ([place]) 2008  . Occlusion and stenosis of carotid artery without mention of cerebral infarction    40-59% on carotid doppler 2014; [place] (01/2013): R 1-39%, L 60-79%  . Rotator cuff injury    chronic rotator cuff injury status post repair  . Seizure disorder ([place])   . Stroke [facility])    a. 01/2013=> post cardiac cath CVA to L post communicating artery system; R sided weakness  . Syncope    Past Surgical History:  Past Surgical History:  Procedure Laterality Date  . COLONOSCOPY    . CORONARY ANGIOPLASTY WITH STENT PLACEMENT  07/14/2002   Stent to the right coronary artery  . ENDOLYMPHATIC SHUNT DECOMPRESSION  06/11/2009    Right endolymphatic sac decompression and shunt  placement  . HERNIA REPAIR  04/10/2008   scrotal hernia repair  . LEFT HEART CATHETERIZATION WITH CORONARY ANGIOGRAM N/A 01/30/2013   Procedure: LEFT HEART CATHETERIZATION WITH CORONARY ANGIOGRAM;  Surgeon: Larey Dresser, MD;  Location: [facility] CATH LAB;  Service: Cardiovascular;  Laterality: N/A;  .  ROTATOR CUFF REPAIR     for chronic rotator cuff injury   Social History:  reports that he quit smoking about 61 years ago. His smoking use included cigarettes. He quit after 5.00 years of use. He has quit using smokeless tobacco.  His smokeless tobacco use included chew. He reports that he does not drink alcohol or use drugs.  Family / Support Systems Marital Status: Married Patient Roles: Spouse, Parent Children: daughter, York Ram @ [phone];  son, Hadriel Northup @ [phone]; daughter, Idelle Jo @ [phone] Anticipated Caregiver: daughter, Maudie Mercury and other granddaughters will arrange 24/7 supervision Ability/Limitations of Caregiver: Daughters work days, but granddaughters and son-in -laws can help during the day.  ONe daughter, Jeannene Patella, stays with pt/wife 2 nights each week when she is in town for job.  Wife can provide some limited supervision only. Caregiver Availability: 24/7 Family Dynamics: Daughter, Maudie Mercury, notes that all pt's children are supportive and will work together to provide all needed support coverage.  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  Notes he did continue still to work following his CVA in 2014 Legal History/Current Legal Issues: None Guardian/Conservator: None -per MD, pt is not yet fully capable of making decisions on his own behalf.   Abuse/Neglect Abuse/Neglect Assessment Can Be Completed: Yes Physical Abuse: Denies Verbal Abuse: Denies Sexual Abuse: Denies Exploitation of patient/patient's resources: Denies Self-Neglect: Denies  Emotional Status Pt's affect, behavior and adjustment status: Pt admits feeling fatigued.  Answers basic questions without too much difficulty.  Reports he is "sleeping too much".  Denies and does not appear to be in any emotional distress - will monitor.  May benefit from neuropsycholgy for additional support. Recent Psychosocial Issues: Wife with declining memory per daughter and notes she "seems worse since all this happended with him (pt)".  She notes her mother remaind independent with ADLs and family merely providing overall supervision to home situation daily. Psychiatric History: None Substance Abuse History: None  Patient / Family Perceptions, Expectations & Goals Pt/Family understanding of illness &  functional limitations: Pt states, "I guess so" when questioned if her understood medical team feels pt is experiencing NPH.  Daughter with good understanding and notes they are already in touch with MD at [facility] for further work up if needed. Premorbid pt/family roles/activities: Pt was independent overall until a couple of weeks PTA Anticipated changes in roles/activities/participation: Family will likely need to increase intensity of supervision they are providing. Pt/family expectations/goals: "I need to not be sleeping so much."  [employer]: None Premorbid Home Care/DME Agencies: Other (Comment)(attended outpatient rehab following CVA in 2014) Transportation available at discharge: yes Resource referrals recommended: Neuropsychology  Discharge Planning Living Arrangements: Spouse/significant other, Children Support Systems: Children, Spouse/significant other, Other relatives, Church/faith community Type of Residence: Private residence Insurance Resources: [employer], Multimedia programmer (specify)([employer]) Financial Resources: Radio broadcast assistant Screen Referred: No Living Expenses: Own Money Management: Patient Does the patient have any problems obtaining your medications?: No Home Management: pt, wife and family Patient/Family Preliminary Plans: Pt to d/c home with wife - other family to increase level of support as needed. Social Work Anticipated Follow Up Needs: HH/OP Expected length of stay: 12-16 days  Clinical Impression Elderly gentleman here for suspected NPH decline in overall function ~ 2 weeks PTA.  Good family support and daughter notes they will provide 24/7 care.  Some concern  that pt's wife experiencing memory issues and general cognitive decline as well and they are prepared to increase home support.  Pt denies any significant emotional distress.  C/O of "sleeping too much."  Will follow for support and d/c planning needs.  HOYLE,  LUCY 06/02/2018, 3:52 PM

## 2018-06-02 NOTE — Evaluation (Signed)
Physical Therapy Assessment and Plan  Patient Details  Name: [name] MRN: [id number] Date of Birth: [birth year]  PT Diagnosis: Abnormality of gait, Difficulty walking, Impaired sensation and Muscle weakness Rehab Potential: Good ELOS: 12-16 days   Today's Date: 06/02/2018 PT Individual Time: 8675-4492 PT Individual Time Calculation (min): 70 min    Problem List:  Patient Active Problem List   Diagnosis Date Noted  . Normal pressure hydrocephalus ([place]) 06/01/2018  . Altered mental status 05/31/2018  . AMS (altered mental status) 05/30/2018  . Seizure disorder ([place]) 05/30/2018  . Fracture of proximal phalanx of finger fifth 06/17/2017 07/21/2017  . Upper airway cough syndrome 11/01/2016  . Atrial flutter ([place]) 08/05/2016  . Coronary artery disease involving native coronary artery of native heart without angina pectoris 08/05/2016  . Chronic anticoagulation 12/31/2015  . History of seizures 12/31/2015  . Acute diastolic CHF (congestive heart failure) ([place]) 12/24/2015  . Pulmonary edema   . Paroxysmal atrial fibrillation (HCC)   . Hypothyroidism   . Controlled type 2 diabetes mellitus with complication, without long-term current use of insulin ([place])   . Hypertensive emergency 12/20/2015  . Special screening for malignant neoplasms, colon 12/07/2013  . Bowel habit changes 12/07/2013  . Diarrhea 03/09/2013  . History of cardioembolic cerebrovascular accident (CVA) 01/31/2013  . Chest pain 01/29/2013  . Bilateral carotid artery disease ([place])   . Loss of coordination 12/03/2012  . Morbid obesity due to excess calories ([place]) 08/20/2011  . CAD S/P percutaneous coronary angioplasty   . Dyslipidemia   . Hypertension     Past Medical History:  Past Medical History:  Diagnosis Date  . Atrial flutter ([place])   . BPH (benign prostatic hyperplasia)   . Coronary artery disease    a. s/p MI tx with Cypher DES to pRCA in 4/04;  b. Echocardiogram 7/09: Normal LV function.  c. Nuclear  study 3/13 no ischemia;  d. ETT 2/14 neg;  e. admx with CP => LHC (01/30/2013):  pLAD 40-50%, oD1 70-80%, oOM1 40%, pRCA stent patent, mid RCA 30%, EF 60-65%. => med Rx.  . DDD (degenerative disc disease)   . Diabetes mellitus without complication ([place])   . Dyslipidemia   . Hemorrhoids   . Hx MRSA infection    left buttocks abscess  . Hx of echocardiogram    a. Echocardiogram (01/31/2013): Mild focal basal and mild concentric hypertrophy of the septum, EF 50-55%, normal wall motion, grade 1 diastolic dysfunction, trivial AI, MAC, mild LAE, PASP 35  . Hyperlipidemia   . Hypertension   . Hypothyroidism   . Meniere's disease    Status post shunt  . Myocardial infarction ([place]) 2008  . Occlusion and stenosis of carotid artery without mention of cerebral infarction    40-59% on carotid doppler 2014; [place] (01/2013): R 1-39%, L 60-79%  . Rotator cuff injury    chronic rotator cuff injury status post repair  . Seizure disorder ([place])   . Stroke [facility])    a. 01/2013=> post cardiac cath CVA to L post communicating artery system; R sided weakness  . Syncope    Past Surgical History:  Past Surgical History:  Procedure Laterality Date  . COLONOSCOPY    . CORONARY ANGIOPLASTY WITH STENT PLACEMENT  07/14/2002   Stent to the right coronary artery  . ENDOLYMPHATIC SHUNT DECOMPRESSION  06/11/2009    Right endolymphatic sac decompression and shunt  placement  . HERNIA REPAIR  04/10/2008   scrotal hernia repair  . LEFT HEART CATHETERIZATION  WITH CORONARY ANGIOGRAM N/A 01/30/2013   Procedure: LEFT HEART CATHETERIZATION WITH CORONARY ANGIOGRAM;  Surgeon: Larey Dresser, MD;  Location: [facility] CATH LAB;  Service: Cardiovascular;  Laterality: N/A;  . ROTATOR CUFF REPAIR     for chronic rotator cuff injury    Assessment & Plan Clinical Impression: Patient is a 85 y.o. year old male with recent admission to the hospital on 03/10/20with altered mental status changes with syncope as well as abnormal chewi  chewing movements with eye fluttering. Episode lasted approximately 2 to 5 minutes with onset of confusion. Family reported issues with urinary incontinence,shuffling gaitwith fallsandcognitive declinein the past year.CT head negativefor acute changes.  He was treated with IVF and tegretol levels WNL - 10. He continued to have waxing and waning of symptoms with ongoing confusion. Dr. Cheral Marker consulted due to concerns of seizure activity and question orthostatic symptoms. EEG was negative for epileptiform activity. MRI/MRA brain done revealing old left occipital/PCA territory infarct, chronic occluded L-PCA old basal ganglia and left thalamic infarcts, moderate small vessel disease and findings of mild chronic communicating hydrocephalus. He recommended follow up with neurology past discharge as well as NPH clinic at [facility] due to possibility of NPH. He had another episode last night that resolved without residual side effects.Patient with cognitive deficits with confabulatory speech, word finding deficits, poor sitting and standing balance as well as deficits in processing affecting ADLs and mobility.  Patient transferred to CIR on 06/01/2018 .   Patient currently requires mod with mobility secondary to muscle weakness, abnormal tone, unbalanced muscle activation and decreased coordination and decreased standing balance, decreased postural control, hemiplegia and decreased balance strategies.  Prior to hospitalization, patient was modified independent  with mobility and lived with Spouse in a House home.  Home access is 2Stairs to enter.  Patient will benefit from skilled PT intervention to maximize safe functional mobility, minimize fall risk and decrease caregiver burden for planned discharge home with 24 hour supervision.  Anticipate patient will benefit from follow up [place] at discharge.  PT - End of Session Activity Tolerance: Tolerates 30+ min activity with multiple rests Endurance Deficit:  Yes PT Assessment Rehab Potential (ACUTE/IP ONLY): Good PT Barriers to Discharge: Decreased caregiver support PT Patient demonstrates impairments in the following area(s): Balance;Endurance;Motor;Pain;Safety PT Transfers Functional Problem(s): Bed Mobility;Bed to Chair;Car;Furniture;Floor PT Locomotion Functional Problem(s): Stairs;Wheelchair Mobility;Ambulation PT Plan PT Intensity: Minimum of 1-2 x/day ,45 to 90 minutes PT Frequency: 5 out of 7 days PT Duration Estimated Length of Stay: 12-16 days PT Treatment/Interventions: Financial risk analyst;Ambulation/gait training;Neuromuscular re-education;Stair training;UE/LE Strength taining/ROM;Wheelchair propulsion/positioning;Balance/vestibular training;Discharge planning;Pain management;Therapeutic Activities;UE/LE Coordination activities;Cognitive remediation/compensation;Functional mobility training;Patient/family education;Splinting/orthotics;Therapeutic Exercise PT Transfers Anticipated Outcome(s): supervision PT Locomotion Anticipated Outcome(s): supervision PT Recommendation Recommendations for Other Services: Neuropsych consult Follow Up Recommendations: Home health PT Equipment Recommended: To be determined  Skilled Therapeutic Intervention Pt participated in skilled PT eval and was educated on PT POC and goals. Pt performs gait 10', 50' x 3 with initial mod A, progressing to min A, HHA.  Pt performs stair negotiation with bilat handrails with min A.  Standing balance with side stepping with min A.  Simulated car transfer to small SUV height with min A for Rt LE into car, cues for safety.  W/c mobility with bilat UEs with supervision, increased time and cuing for use of Rt UE.  Pt performs nustep x 5 minutes for bilat UE/LE strength and coordination with min cuing to prevent Rt hip ER during task.  Pt left in w/c  with alarm set, needs at hand.  PT  Evaluation Precautions/Restrictions Precautions Precautions: Fall Restrictions Weight Bearing Restrictions: No Pain Pain Assessment Pain Scale: Faces Pain Score: 0-No pain Faces Pain Scale: Hurts little more Pain Location: Leg Pain Orientation: Right;Left Pain Descriptors / Indicators: Aching Pain Onset: With Activity Pain Intervention(s): Repositioned;Rest Home Living/Prior Functioning Home Living Available Help at Discharge: Family;Available 24 hours/day Type of Home: House Home Access: Stairs to enter [employer] of Steps: 2 Entrance Stairs-Rails: Right;Left;Can reach both Home Layout: One level Additional Comments: wife with new dx dementia  Lives With: Spouse Prior Function Level of Independence: Independent with transfers;Independent with gait  Able to Take Stairs?: Yes Driving: Yes Vocation: Retired Comments: family members report pt has been deconditioning more in previous 2 weeks   Cognition Overall Cognitive Status: History of cognitive impairments - at baseline Arousal/Alertness: Awake/alert Orientation Level: Oriented X4 Sustained Attention: Appears intact Sensation Sensation Light Touch: Impaired Detail Light Touch Impaired Details: Impaired RLE;Impaired RUE Proprioception: Impaired Detail Proprioception Impaired Details: Impaired RLE;Impaired RUE Coordination Gross Motor Movements are Fluid and Coordinated: No Fine Motor Movements are Fluid and Coordinated: No Coordination and Movement Description: residual Rt hemiplegia from previous CVA Motor  Motor Motor: Hemiplegia Motor - Skilled Clinical Observations:  R hemi from CVA PTA  Mobility Transfers Sit to Stand: Moderate Assistance - Patient 50-74% Stand to Sit: Moderate Assistance - Patient 50-74% Stand Pivot Transfers: Moderate Assistance - Patient 50 - 74% Transfer (Assistive device): 1 person hand held assist Locomotion  Gait Ambulation: Yes Gait Assistance: Minimal Assistance -  Patient > 75% Gait Distance (Feet): 10 Feet Assistive device: 1 person hand held assist Stairs / Additional Locomotion Stairs: Yes Stairs Assistance: Minimal Assistance - Patient > 75% Stair Management Technique: Two rails Number of Stairs: 4 Wheelchair Mobility Wheelchair Mobility: Yes Wheelchair Assistance: Chartered loss adjuster: Both upper extremities Wheelchair Parts Management: Needs assistance Distance: 50  Trunk/Postural Assessment  Cervical Assessment Cervical Assessment: (fwd head) Thoracic Assessment Thoracic Assessment: (rounded shoulders) Lumbar Assessment Lumbar Assessment: (posterior pelvic tilt) Postural Control Postural Control: Deficits on evaluation Righting Reactions: delayed and insufficient  Balance Static Sitting Balance Static Sitting - Level of Assistance: 5: Stand by assistance Dynamic Standing Balance Dynamic Standing - Balance Support: During functional activity Dynamic Standing - Level of Assistance: 3: Mod assist Extremity Assessment      RLE Assessment General Strength Comments: grossly 3-/5 LLE Assessment General Strength Comments: grossly 3/5    Refer to Care Plan for Long Term Goals  Recommendations for other services: Neuropsych  Discharge Criteria: Patient will be discharged from PT if patient refuses treatment 3 consecutive times without medical reason, if treatment goals not met, if there is a change in medical status, if patient makes no progress towards goals or if patient is discharged from hospital.  The above assessment, treatment plan, treatment alternatives and goals were discussed and mutually agreed upon: by patient  [name],[name] 06/02/2018, 11:49 AM

## 2018-06-02 NOTE — Progress Notes (Signed)
Per nursing, patient was given "Data Collection Information Summary for Patients in Inpatient Rehabilitation Facilities with attached Privacy Act Statement Health Care Records" upon admission.    Patient information reviewed and entered into eRehab System by Becky Windsor, PPS coordinator. Information including medical coding, function ability, and quality indicators will be reviewed and updated through discharge.   

## 2018-06-02 NOTE — Progress Notes (Signed)
ANTICOAGULATION CONSULT NOTE - Follow Up Consult  Pharmacy Consult for Coumadin Indication: atrial fibrillation  No Known Allergies  Patient Measurements: Weight: 201 lb 8 oz (91.4 kg)  Vital Signs: Temp: 98 F (36.7 C) (03/13 0451) BP: 170/83 (03/13 0451) Pulse Rate: 66 (03/13 0451)  Labs: Recent Labs    05/31/18 0328 06/01/18 0323 06/02/18 0554  HGB 13.2 13.0 13.4  HCT 40.7 40.2 40.7  PLT 289 315 325  LABPROT 22.4* 35.3* 30.0*  INR 2.0* 3.6* 2.9*  CREATININE 0.99 1.19 1.18    Estimated Creatinine Clearance: 50.2 mL/min (by C-G formula based on SCr of 1.18 mg/dL).   Assessment:  Anticoag: Coumadin for Afib; admit INR below goal w/ last dose taken 3/8 (missed 3/9?).    - Home dose 7.5mg  daily  except 11.25 MWF (admit INR 1.6 but missed dose 3/9)  INR is back in the therapeutic range at 2.9. We will try the home dose today.   Goal of Therapy:  INR 2-3 Monitor platelets by anticoagulation protocol: Yes   Plan:  Coumadin 11.25mg  PO x1 Daily INR  Onnie Boer, PharmD, [place], AAHIVP, CPP Infectious Disease Pharmacist 06/02/2018 9:37 AM

## 2018-06-02 NOTE — Evaluation (Signed)
Speech Language Pathology Assessment and Plan  Patient Details  Name: [name] MRN: [id number] Date of Birth: [birth year]  SLP Diagnosis: Cognitive Impairments;Aphasia  Rehab Potential: Good ELOS: 12-16 days     Today's Date: 06/02/2018 SLP Individual Time: 3086-5784 SLP Individual Time Calculation (min): 55 min   Problem List:  Patient Active Problem List   Diagnosis Date Noted  . Normal pressure hydrocephalus ([place]) 06/01/2018  . Altered mental status 05/31/2018  . AMS (altered mental status) 05/30/2018  . Seizure disorder ([place]) 05/30/2018  . Fracture of proximal phalanx of finger fifth 06/17/2017 07/21/2017  . Upper airway cough syndrome 11/01/2016  . Atrial flutter ([place]) 08/05/2016  . Coronary artery disease involving native coronary artery of native heart without angina pectoris 08/05/2016  . Chronic anticoagulation 12/31/2015  . History of seizures 12/31/2015  . Acute diastolic CHF (congestive heart failure) ([place]) 12/24/2015  . Pulmonary edema   . Paroxysmal atrial fibrillation ([facility])   . Hypothyroidism   . Controlled type 2 diabetes mellitus with complication, without long-term current use of insulin ([place])   . Hypertensive emergency 12/20/2015  . Special screening for malignant neoplasms, colon 12/07/2013  . Bowel habit changes 12/07/2013  . Diarrhea 03/09/2013  . History of cardioembolic cerebrovascular accident (CVA) 01/31/2013  . Chest pain 01/29/2013  . Bilateral carotid artery disease ([place])   . Loss of coordination 12/03/2012  . Morbid obesity due to excess calories ([place]) 08/20/2011  . CAD S/P percutaneous coronary angioplasty   . Dyslipidemia   . Hypertension    Past Medical History:  Past Medical History:  Diagnosis Date  . Atrial flutter ([place])   . BPH (benign prostatic hyperplasia)   . Coronary artery disease    a. s/p MI tx with Cypher DES to pRCA in 4/04;  b. Echocardiogram 7/09: Normal LV function.  c. Nuclear study 3/13 no ischemia;  d. ETT  2/14 neg;  e. admx with CP => LHC (01/30/2013):  pLAD 40-50%, oD1 70-80%, oOM1 40%, pRCA stent patent, mid RCA 30%, EF 60-65%. => med Rx.  . DDD (degenerative disc disease)   . Diabetes mellitus without complication ([place])   . Dyslipidemia   . Hemorrhoids   . Hx MRSA infection    left buttocks abscess  . Hx of echocardiogram    a. Echocardiogram (01/31/2013): Mild focal basal and mild concentric hypertrophy of the septum, EF 50-55%, normal wall motion, grade 1 diastolic dysfunction, trivial AI, MAC, mild LAE, PASP 35  . Hyperlipidemia   . Hypertension   . Hypothyroidism   . Meniere's disease    Status post shunt  . Myocardial infarction ([place]) 2008  . Occlusion and stenosis of carotid artery without mention of cerebral infarction    40-59% on carotid doppler 2014; [place] (01/2013): R 1-39%, L 60-79%  . Rotator cuff injury    chronic rotator cuff injury status post repair  . Seizure disorder ([place])   . Stroke [facility])    a. 01/2013=> post cardiac cath CVA to L post communicating artery system; R sided weakness  . Syncope    Past Surgical History:  Past Surgical History:  Procedure Laterality Date  . COLONOSCOPY    . CORONARY ANGIOPLASTY WITH STENT PLACEMENT  07/14/2002   Stent to the right coronary artery  . ENDOLYMPHATIC SHUNT DECOMPRESSION  06/11/2009    Right endolymphatic sac decompression and shunt  placement  . HERNIA REPAIR  04/10/2008   scrotal hernia repair  . LEFT HEART CATHETERIZATION WITH CORONARY ANGIOGRAM N/A 01/30/2013  Procedure: LEFT HEART CATHETERIZATION WITH CORONARY ANGIOGRAM;  Surgeon: Larey Dresser, MD;  Location: [facility] CATH LAB;  Service: Cardiovascular;  Laterality: N/A;  . ROTATOR CUFF REPAIR     for chronic rotator cuff injury    Assessment / Plan / Recommendation Clinical Impression Patient is an85 year old male with history of CVA with residual R-HP and facial droop,seizures, CAS, T2DM, A flutter-Coumadin, CAD, Meniere's disease, recent bronchitis flarewho  was admitted via ED on 03/10/20with altered mental status changes with syncope as well as abnormal chewi chewing movements with eye fluttering. Episode lasted approximately 2 to 5 minutes with onset of confusion. Family reported issues with urinary incontinence,shuffling gaitwith fallsandcognitive declinein the past year.CT head negativefor acute changes. He was treated with IVF and tegretol levels WNL - 10. He continued to have waxing and waning of symptoms with ongoing confusion. Dr. Cheral Marker consulted due to concerns of seizure activity and question orthostatic symptoms. EEG was negative for epileptiform activity. MRI/MRA brain done revealing old left occipital/PCA territory infarct, chronic occluded L-PCA old basal ganglia and left thalamic infarcts, moderate small vessel disease and findings of mild chronic communicating hydrocephalus. He recommended follow up with neurology past discharge as well as NPH clinic at [facility] due to possibility of NPH. He had another episode last night that resolved without residual side effects.Patient with cognitive deficits with confabulatory speech, word finding deficits, poor sitting and standing balance as well as deficits in processing affecting ADLs and mobility. CIR recommended for follow up therapy and patient admitted 06/01/18.  Patient administered the Cognistat and demonstrates mild impairments in naming and moderate cognitive impairments in recall and orientation. Throughout session, patient demonstrated appropriate awareness of deficits and was able to verbalize goals of skilled SLP intervention. Patient would benefit from skilled SLP intervention to maximize his cognitive-linguistic function and overall functional independence prior to discharge.    Skilled Therapeutic Interventions          Administered the Cognistat, please see above for details. Educated patient in regards to his current cognitive impairments and goals of skilled SLP intervention,  please see above for details.    SLP Assessment  Patient will need skilled [facility] during CIR admission    Recommendations  Oral Care Recommendations: Oral care BID Recommendations for Other Services: Neuropsych consult Patient destination: Home Follow up Recommendations: 24 hour supervision/assistance;Home Health SLP Equipment Recommended: None recommended by SLP    SLP Frequency 3 to 5 out of 7 days   SLP Duration  SLP Intensity  SLP Treatment/Interventions 12-16 days   Minumum of 1-2 x/day, 30 to 90 minutes  Cognitive remediation/compensation;Environmental controls;Internal/external aids;Cueing hierarchy;Functional tasks;Patient/family education;Speech/Language facilitation    Pain Pain Assessment Pain Scale: Faces Faces Pain Scale: Hurts little more Pain Location: Leg Pain Orientation: Right;Left Pain Descriptors / Indicators: Aching Pain Onset: With Activity Pain Intervention(s): Repositioned;Rest  Prior Functioning Type of Home: House  Lives With: Spouse Available Help at Discharge: Family;Available 24 hours/day Vocation: Retired  Industrial/product designer Term Goals: Week 1: SLP Short Term Goal 1 (Week 1): Patient will recall new, daily information with Min A multimodal cues.  SLP Short Term Goal 2 (Week 1): Patient will demonstrate functional problem solving for mildly complex tasks with Min A verbal cues.  SLP Short Term Goal 3 (Week 1): Patient will utilize word-finding strategies during complex verbal tasks with Mod I.   Refer to Care Plan for Long Term Goals  Recommendations for other services: Neuropsych  Discharge Criteria: Patient will be discharged from SLP if  patient refuses treatment 3 consecutive times without medical reason, if treatment goals not met, if there is a change in medical status, if patient makes no progress towards goals or if patient is discharged from hospital.  The above assessment, treatment plan, treatment alternatives  and goals were discussed and mutually agreed upon: by patient  PAYNE, COURTNEY 06/02/2018, 3:11 PM

## 2018-06-03 ENCOUNTER — Inpatient Hospital Stay (HOSPITAL_COMMUNITY): Payer: Medicare Other | Admitting: Physical Therapy

## 2018-06-03 ENCOUNTER — Inpatient Hospital Stay (HOSPITAL_COMMUNITY): Payer: Medicare Other | Admitting: Speech Pathology

## 2018-06-03 ENCOUNTER — Inpatient Hospital Stay (HOSPITAL_COMMUNITY): Payer: Medicare Other

## 2018-06-03 DIAGNOSIS — B9620 Unspecified Escherichia coli [E. coli] as the cause of diseases classified elsewhere: Secondary | ICD-10-CM

## 2018-06-03 DIAGNOSIS — N390 Urinary tract infection, site not specified: Secondary | ICD-10-CM

## 2018-06-03 LAB — PROTIME-INR
INR: 3 — ABNORMAL HIGH (ref 0.8–1.2)
Prothrombin Time: 30.4 seconds — ABNORMAL HIGH (ref 11.4–15.2)

## 2018-06-03 LAB — GLUCOSE, CAPILLARY
Glucose-Capillary: 112 mg/dL — ABNORMAL HIGH (ref 70–99)
Glucose-Capillary: 139 mg/dL — ABNORMAL HIGH (ref 70–99)
Glucose-Capillary: 150 mg/dL — ABNORMAL HIGH (ref 70–99)
Glucose-Capillary: 148 mg/dL — ABNORMAL HIGH (ref 70–99)

## 2018-06-03 LAB — URINE CULTURE: Culture: 40000 — AB

## 2018-06-03 MED ORDER — WARFARIN SODIUM 7.5 MG PO TABS
7.5000 mg | ORAL_TABLET | Freq: Once | ORAL | Status: AC
  Administered 2018-06-03: 7.5 mg via ORAL
  Filled 2018-06-03: qty 1

## 2018-06-03 MED ORDER — CEPHALEXIN 250 MG PO CAPS
250.0000 mg | ORAL_CAPSULE | Freq: Three times a day (TID) | ORAL | Status: AC
  Administered 2018-06-03 – 2018-06-08 (×15): 250 mg via ORAL
  Filled 2018-06-03 (×15): qty 1

## 2018-06-03 NOTE — Progress Notes (Signed)
Occupational Therapy Session Note  Patient Details  Name: [name] MRN: [id number] Date of Birth: October 26, 1933  Today's Date: 06/03/2018 OT Individual Time: 0254-2706 OT Individual Time Calculation (min): 72 min    Short Term Goals: Week 1:  OT Short Term Goal 1 (Week 1): Pt will stand for oral care and face washing to demo improved endurance OT Short Term Goal 2 (Week 1): Pt will orient clothing iwht no more than min VC OT Short Term Goal 3 (Week 1): Pt will complete toilet transfer wiht LRAD and MIN A OT Short Term Goal 4 (Week 1): Pt will completes 2/3 steps of toileting with min A  Skilled Therapeutic Interventions/Progress Updates:    1;1. Pt received in bed with no c/o pain. Pt reporting wanting to wash up at sink, declining shower. Pt completes stand pivot transfer with MOD A using RW with VC for RW management throughout pivot and upright posture. Pt bathes UB and face at sink with supervision. MOD A sit to stand provided for bathing peri area and buttocks in standing at sink. Pt requires VC for noticing threading RUE into head hole. Pt threads BLE into pants by crossing figure 4. Pt requires MOD A for standing balance during and A to fuly advance pants past hips. Pt demo increased R lean this date with demo placing LUE far up sink to encourage weight shifting to midline. Pt able to don B grip socks. Pt practices "dry run" of shower transfer with MOD A using grab bar to ease fear of falling during shower for next OT session. Pt completes standing grooming with VC for locating objects on R and min A for static standing balance. Exited session with pt seated in w/c, call light in reach and all need smet   Therapy Documentation Precautions:  Precautions Precautions: Fall Restrictions Weight Bearing Restrictions: No General:    Therapy/Group: Individual Therapy  [name] 06/03/2018, 9:28 AM

## 2018-06-03 NOTE — Progress Notes (Signed)
[facility] PHYSICAL MEDICINE & REHABILITATION PROGRESS NOTE   Subjective/Complaints: Slept well. No new issues this morning  ROS: Patient denies fever, rash, sore throat, blurred vision, nausea, vomiting, diarrhea, cough, shortness of breath or chest pain, joint or back pain, headache, or mood change.    Objective:   No results found. Recent Labs    06/01/18 0323 06/02/18 0554  WBC 11.6* 12.3*  HGB 13.0 13.4  HCT 40.2  38.5 40.7  PLT 315 325   Recent Labs    06/01/18 0323 06/02/18 0554  NA 133* 136  K 4.0 4.1  CL 98 100  CO2 26 27  GLUCOSE 121* 127*  BUN 20 19  CREATININE 1.19 1.18  CALCIUM 9.2 9.0    Intake/Output Summary (Last 24 hours) at 06/03/2018 1112 Last data filed at 06/02/2018 2130 Gross per 24 hour  Intake 540 ml  Output -  Net 540 ml     Physical Exam: Vital Signs Blood pressure (!) 151/82, pulse 81, temperature 98.5 F (36.9 C), temperature source Oral, resp. rate 16, height 5\' 5"  (1.651 m), weight 91.4 kg, SpO2 95 %. Constitutional: No distress . Vital signs reviewed. HEENT: EOMI, oral membranes moist Neck: supple Cardiovascular: RRR without murmur. No JVD    Respiratory: CTA Bilaterally without wheezes or rales. Normal effort    GI: BS +, non-tender, non-distended   Musculoskeletal:  Comments: Large lipoma right medial biceps region. Neurological: He is alert. Remains a little tangential fair insight and awareness.  CN intact Motor is 5/5 in bilateral deltoid, bicep, tricep, grip, hip flexor, knee extensors, ankle dorsiflexor and plantar flexor. Sensory exam normal. No tremor/tone Psych: restless   Assessment/Plan: 1. Functional deficits secondary to NPH which require 3+ hours per day of interdisciplinary therapy in a comprehensive inpatient rehab setting.  Physiatrist is providing close team supervision and 24 hour management of active medical problems listed below.  Physiatrist and rehab team continue to assess barriers to  discharge/monitor patient progress toward functional and medical goals  Care Tool:  Bathing  Bathing activity did not occur: Safety/medical concerns Body parts bathed by patient: Right arm, Left arm, Chest, Abdomen, Front perineal area, Right upper leg, Left upper leg, Right lower leg, Left lower leg, Face         Bathing assist Assist Level: Minimal Assistance - Patient > 75%     Upper Body Dressing/Undressing Upper body dressing   What is the patient wearing?: Hospital gown only    Upper body assist Assist Level: Supervision/Verbal cueing    Lower Body Dressing/Undressing Lower body dressing      What is the patient wearing?: Pants, Underwear/pull up     Lower body assist Assist for lower body dressing: Minimal Assistance - Patient > 75%     Toileting Toileting    Toileting assist Assist for toileting: Maximal Assistance - Patient 25 - 49%     Transfers Chair/bed transfer  Transfers assist  Chair/bed transfer activity did not occur: Safety/medical concerns  Chair/bed transfer assist level: Moderate Assistance - Patient 50 - 74%     Locomotion Ambulation   Ambulation assist      Assist level: Moderate Assistance - Patient 50 - 74%   Max distance: 10   Walk 10 feet activity   Assist     Assist level: Moderate Assistance - Patient - 50 - 74%     Walk 50 feet activity   Assist Walk 50 feet with 2 turns activity did not occur: Safety/medical concerns  Walk 150 feet activity   Assist Walk 150 feet activity did not occur: Safety/medical concerns         Walk 10 feet on uneven surface  activity   Assist Walk 10 feet on uneven surfaces activity did not occur: Safety/medical concerns         Wheelchair     Assist Will patient use wheelchair at discharge?: No Type of Wheelchair: Manual    Wheelchair assist level: Supervision/Verbal cueing Max wheelchair distance: 50    Wheelchair 50 feet with 2 turns  activity    Assist        Assist Level: Supervision/Verbal cueing   Wheelchair 150 feet activity     Assist Wheelchair 150 feet activity did not occur: Safety/medical concerns        Medical Problem List and Plan: 1.   Gait disorder and mental status changes secondary to normal pressure hydrocephalus  --Continue CIR therapies including PT, OT, and SLP  2. Antithrombotics: -DVT/anticoagulation:Pharmaceutical:Coumadin INR 2.9 -antiplatelet therapy: N/a 3. Pain Management:tylenol prn. 4. Mood:LCSW to follow for evaluation and support. -antipsychotic agents: N/A 5. Neuropsych: This patientis notyet capable of making decisions on hisown behalf. 6. Skin/Wound Care:Routine pressure relief measures. 7. Fluids/Electrolytes/Nutrition: encourage PO  Recent labs WNL  8. H/o CVA/NPH: On coumadin.  9. Paroxymal Afib: Monitor HR bid. Metoprolol resumed today. Continue coumadin.  -HR controlled 10. HTN: Monitor BP bid--check orthostatic BP. On metoprolol and Imdur.  -bp a little better  -resumed losartan  at 25mg  daily 3/13  Continue to hold Lasix,   andcatapres. 11. H/O seizures: On tegretol BID.Repeat levels 9.2. 12. T2DM: Monitor BS ac/hs--continue to use SSi for elevated BS. Blood sugars poorly controlled-- will resume metformin and titrate as indicated  -fair control at present.  13. CAD: On Lipitor, Imdur and metoprolol.  11. Leukocytosis: wbc's sl up to 12.3   -ucx + E coli 4oK  -begin keflex 250mg  tid for 3 days  -recheck cbc monday 12. Hyponatremia: Chronic? Off Lexapro now.    -improved to 136 3/14    LOS: 2 days A FACE TO [place] 06/03/2018, 11:12 AM

## 2018-06-03 NOTE — Progress Notes (Signed)
Physical Therapy Session Note  Patient Details  Name: [name] MRN: [id number] Date of Birth: February 10, 1934  Today's Date: 06/03/2018 PT Individual Time:  -      Short Term Goals: Week 1:  PT Short Term Goal 1 (Week 1): pt will consistently perform functional transfers with min A PT Short Term Goal 2 (Week 1): pt will perform gait with min A 100' in controlled environment  Skilled Therapeutic Interventions/Progress Updates:    Pt received in [facility] in room, willing to participate in therapy. Sit to stand with modA for boost from [facility]. Attempted walking with RW today; pt able to ambulate 64 ft, seated rest break, 58 ft with MinA overall for steadying, but requiring modA to regain balance when he began to gain forward momentum and fall forwards. Manual and verbal cues throughout for hand placement and RW management.  Pt sat at the edge of the mat table with cuing for sequencing and hand placement for safety, minA for control of descent, as pt tends to lose control of descent halfway into sitting. Practiced weight shift and single leg balance by tapping cones, with a cognitive task of tapping color cone PT said. Verbal and manual cues for weight shift, and pt required increased time to process before completing task.  Static balance with feet shoulder width apart, no UE support x 30 seconds, minA for maintenance of balance.  Pt then transferred back to [facility] by stand pivot transfer, minA for sequencing of steps and balance. Pushed pt back to room totalA for time management. Pt stood from [facility] with minA and walked to bed with RW, 5 ft, minA for balance and cues for alignment with bed. MinA for LE management back into bed. Pt left in bed with alarm active, call bell in reach, family in room.  Therapy Documentation Precautions:  Precautions Precautions: Fall Restrictions Weight Bearing Restrictions: No   Pain: Pain Assessment Pain Scale: 0-10 Pain Score: 0-No pain    Therapy/Group: Individual  Therapy   Ronnell Guadalajara, SPT  Ronnell Guadalajara 06/03/2018, 4:13 PM

## 2018-06-03 NOTE — Progress Notes (Signed)
Speech Language Pathology Daily Session Note  Patient Details  Name: [name] MRN: [id number] Date of Birth: 1933/11/23  Today's Date: 06/03/2018   Skilled treatment session #1 SLP Individual Time: 6599-3570 SLP Individual Time Calculation (min): 25 min   Skilled treatment session #2 SLP Individual Time: 1200-1230 SLP Individual Time Calculation (min): 30 min  Short Term Goals: Week 1: SLP Short Term Goal 1 (Week 1): Patient will recall new, daily information with Min A multimodal cues.  SLP Short Term Goal 2 (Week 1): Patient will demonstrate functional problem solving for mildly complex tasks with Min A verbal cues.  SLP Short Term Goal 3 (Week 1): Patient will utilize word-finding strategies during complex verbal tasks with Mod I.   Skilled Therapeutic Interventions:  Skilled treatment session #1 :SLP recieved pt in asleep in bed with breakfast tray present. Pt easily awoke and consumed breakfast during intial portion of session. While consuming breakfast SLP gave pt 3 words (cup, spoon, cereal) to retain and recall after 5 minutes. Compensatory strategies of repetition and visualization of the objects (present on his tray) were practiced and later reviewed. Session interrupted by unit emergency. When SLP returned ot room, 30 minutes had past, pt able to recall 1 of 3 words even with cues to look at items on tray. Suspect this was too long of length of time. SLP further faciltiated session by providing Max A multimodal cues to learn novel card game [facility]) played with 3 player cards (not 5). Pt with difficulty comprehending rules of game and was not able to recall 3 ways in which cards could be matched. Will review at next session.   Skilled treatment session #2: SLP facilitiated session by providing Mod A faded to Min A to recall ways in which card game is played ([place] with 3 player cards). Pt better able to implement game with Mod A question cues. Pt left upright in bed,  all needs within reach.      Pain Pain Assessment Pain Scale: 0-10 Pain Score: 0-No pain  Therapy/Group: Individual Therapy  Happi Overton 06/03/2018, 3:09 PM

## 2018-06-03 NOTE — Progress Notes (Signed)
ANTICOAGULATION CONSULT NOTE - Follow Up Consult  Pharmacy Consult for Coumadin Indication: atrial fibrillation  No Known Allergies  Patient Measurements: Height: 5\' 5"  (165.1 cm) Weight: 201 lb 8 oz (91.4 kg) IBW/kg (Calculated) : 61.5  Vital Signs: Temp: 98.5 F (36.9 C) (03/14 0341) Temp Source: Oral (03/14 0341) BP: 126/84 (03/14 0341) Pulse Rate: 81 (03/14 0341)  Labs: Recent Labs    06/01/18 0323 06/02/18 0554 06/03/18 0644  HGB 13.0 13.4  --   HCT 40.2  38.5 40.7  --   PLT 315 325  --   LABPROT 35.3* 30.0* 30.4*  INR 3.6* 2.9* 3.0*  CREATININE 1.19 1.18  --     Estimated Creatinine Clearance: 50.2 mL/min (by C-G formula based on SCr of 1.18 mg/dL).   Assessment:  Anticoag: Coumadin for Afib; admit INR below goal w/ last dose taken 3/8 (missed 3/9?).    - Home dose 7.5mg  daily  except 11.25 MWF (admit INR 1.6 but missed dose 3/9)  INR remains at upper end of therapeutic range at 3. Pt is due for lower dose today per home regimen.    Goal of Therapy:  INR 2-3 Monitor platelets by anticoagulation protocol: Yes   Plan:  Coumadin 7.5mg  PO x1 Daily INR  [employer], Pharm.D., BCPS Clinical Pharmacist Pager: [phone] Clinical phone for 06/03/2018 from 7:30-3:00 is [phone].  **Pharmacist phone directory can now be found on [email or web address] ([email or web address]).  Listed under [place].  06/03/2018 9:50 AM

## 2018-06-04 ENCOUNTER — Inpatient Hospital Stay (HOSPITAL_COMMUNITY): Payer: Medicare Other | Admitting: Speech Pathology

## 2018-06-04 LAB — PROTIME-INR
INR: 3 — ABNORMAL HIGH (ref 0.8–1.2)
Prothrombin Time: 30.4 seconds — ABNORMAL HIGH (ref 11.4–15.2)

## 2018-06-04 LAB — GLUCOSE, CAPILLARY
Glucose-Capillary: 118 mg/dL — ABNORMAL HIGH (ref 70–99)
Glucose-Capillary: 185 mg/dL — ABNORMAL HIGH (ref 70–99)
Glucose-Capillary: 134 mg/dL — ABNORMAL HIGH (ref 70–99)
Glucose-Capillary: 158 mg/dL — ABNORMAL HIGH (ref 70–99)

## 2018-06-04 MED ORDER — WARFARIN SODIUM 7.5 MG PO TABS
7.5000 mg | ORAL_TABLET | Freq: Once | ORAL | Status: AC
  Administered 2018-06-04: 7.5 mg via ORAL
  Filled 2018-06-04: qty 1

## 2018-06-04 MED ORDER — LOSARTAN POTASSIUM 50 MG PO TABS
50.0000 mg | ORAL_TABLET | Freq: Every day | ORAL | Status: DC
  Administered 2018-06-05 – 2018-06-06 (×2): 50 mg via ORAL
  Filled 2018-06-04 (×2): qty 1

## 2018-06-04 NOTE — Plan of Care (Signed)
  Problem: RH BLADDER ELIMINATION Goal: RH STG MANAGE BLADDER WITH ASSISTANCE Description: STG Manage Bladder With min Assistance Outcome: Not Progressing;incontinence   

## 2018-06-04 NOTE — Progress Notes (Signed)
[facility] PHYSICAL MEDICINE & REHABILITATION PROGRESS NOTE   Subjective/Complaints: No new issues. Feels well. Good appetite  ROS: Patient denies fever, rash, sore throat, blurred vision, nausea, vomiting, diarrhea, cough, shortness of breath or chest pain, joint or back pain, headache, or mood change.    Objective:   No results found. Recent Labs    06/02/18 0554  WBC 12.3*  HGB 13.4  HCT 40.7  PLT 325   Recent Labs    06/02/18 0554  NA 136  K 4.1  CL 100  CO2 27  GLUCOSE 127*  BUN 19  CREATININE 1.18  CALCIUM 9.0    Intake/Output Summary (Last 24 hours) at 06/04/2018 0958 Last data filed at 06/04/2018 0745 Gross per 24 hour  Intake 840 ml  Output -  Net 840 ml     Physical Exam: Vital Signs Blood pressure (!) 154/87, pulse 72, temperature 99.6 F (37.6 C), resp. rate (!) 22, height 5\' 5"  (1.651 m), weight 91.4 kg, SpO2 95 %. Constitutional: No distress . Vital signs reviewed. HEENT: EOMI, oral membranes moist Neck: supple Cardiovascular: RRR without murmur. No JVD    Respiratory: CTA Bilaterally without wheezes or rales. Normal effort    GI: BS +, non-tender, non-distended    Musculoskeletal:  Comments: Large lipoma right medial biceps region. Neurological: He is alert. Remains a little tangential fair insight and awareness.  CN intact Motor is 5/5 in bilateral deltoid, bicep, tricep, grip, hip flexor, knee extensors, ankle dorsiflexor and plantar flexor. Sensory exam normal. No tremor/tone Psych: restless   Assessment/Plan: 1. Functional deficits secondary to NPH which require 3+ hours per day of interdisciplinary therapy in a comprehensive inpatient rehab setting.  Physiatrist is providing close team supervision and 24 hour management of active medical problems listed below.  Physiatrist and rehab team continue to assess barriers to discharge/monitor patient progress toward functional and medical goals  Care Tool:  Bathing  Bathing  activity did not occur: Safety/medical concerns Body parts bathed by patient: Right arm, Left arm, Chest, Abdomen, Front perineal area, Right upper leg, Left upper leg, Right lower leg, Left lower leg, Face         Bathing assist Assist Level: Minimal Assistance - Patient > 75%     Upper Body Dressing/Undressing Upper body dressing   What is the patient wearing?: Hospital gown only    Upper body assist Assist Level: Minimal Assistance - Patient > 75%    Lower Body Dressing/Undressing Lower body dressing      What is the patient wearing?: Incontinence brief     Lower body assist Assist for lower body dressing: Total Assistance - Patient < 25%     Toileting Toileting    Toileting assist Assist for toileting: Maximal Assistance - Patient 25 - 49%     Transfers Chair/bed transfer  Transfers assist  Chair/bed transfer activity did not occur: Safety/medical concerns  Chair/bed transfer assist level: Moderate Assistance - Patient 50 - 74%     Locomotion Ambulation   Ambulation assist      Assist level: Moderate Assistance - Patient 50 - 74% Assistive device: Walker-rolling Max distance: 42'   Walk 10 feet activity   Assist     Assist level: Moderate Assistance - Patient - 50 - 74% Assistive device: Walker-rolling   Walk 50 feet activity   Assist Walk 50 feet with 2 turns activity did not occur: Safety/medical concerns  Assist level: Moderate Assistance - Patient - 50 - 74% Assistive device: Walker-rolling  Walk 150 feet activity   Assist Walk 150 feet activity did not occur: Safety/medical concerns         Walk 10 feet on uneven surface  activity   Assist Walk 10 feet on uneven surfaces activity did not occur: Safety/medical concerns         Wheelchair     Assist Will patient use wheelchair at discharge?: No Type of Wheelchair: Manual    Wheelchair assist level: Supervision/Verbal cueing Max wheelchair distance: 50     Wheelchair 50 feet with 2 turns activity    Assist        Assist Level: Supervision/Verbal cueing   Wheelchair 150 feet activity     Assist Wheelchair 150 feet activity did not occur: Safety/medical concerns        Medical Problem List and Plan: 1.   Gait disorder and mental status changes secondary to normal pressure hydrocephalus  --Continue CIR therapies including PT, OT, and SLP  2. Antithrombotics: -DVT/anticoagulation:Pharmaceutical:Coumadin INR 2.9 -antiplatelet therapy: N/a 3. Pain Management:tylenol prn. 4. Mood:LCSW to follow for evaluation and support. -antipsychotic agents: N/A 5. Neuropsych: This patientis notyet capable of making decisions on hisown behalf. 6. Skin/Wound Care:Routine pressure relief measures. 7. Fluids/Electrolytes/Nutrition: encourage PO  Recent labs WNL  8. H/o CVA/NPH: On coumadin.  9. Paroxymal Afib: Monitor HR bid. Metoprolol resumed today. Continue coumadin.  -HR controlled 3/15 10. HTN: Monitor BP bid--check orthostatic BP. On metoprolol and Imdur.  -bp a little better  -resumed losartan  at 25mg  daily 3/13--increase to 50mg  today  Continue to hold Lasix,   andcatapres. 11. H/O seizures: On tegretol BID.Repeat levels 9.2. 12. T2DM: Monitor BS ac/hs--continue to use SSi for elevated BS. Blood sugars poorly controlled-- will resume metformin and titrate as indicated  -fair control at present. No changes today 13. CAD: On Lipitor, Imdur and metoprolol.  11. Leukocytosis: wbc's sl up to 12.3   -ucx + E coli 4oK  -begin keflex 250mg  tid for 3 days  -recheck cbc Monday 12. Hyponatremia: Chronic? Off Lexapro now.    -improved to 136 3/14    LOS: 3 days A FACE TO [place] 06/04/2018, 9:58 AM

## 2018-06-04 NOTE — Progress Notes (Signed)
ANTICOAGULATION CONSULT NOTE - Follow Up Consult  Pharmacy Consult for Coumadin Indication: atrial fibrillation  No Known Allergies  Patient Measurements: Height: 5\' 5"  (165.1 cm) Weight: 201 lb 8 oz (91.4 kg) IBW/kg (Calculated) : 61.5  Vital Signs: Temp: 99.6 F (37.6 C) (03/15 0440) BP: 154/87 (03/15 0440) Pulse Rate: 72 (03/15 0440)  Labs: Recent Labs    06/02/18 0554 06/03/18 0644 06/04/18 0755  HGB 13.4  --   --   HCT 40.7  --   --   PLT 325  --   --   LABPROT 30.0* 30.4* 30.4*  INR 2.9* 3.0* 3.0*  CREATININE 1.18  --   --     Estimated Creatinine Clearance: 50.2 mL/min (by C-G formula based on SCr of 1.18 mg/dL).   Assessment: [name] on Coumadin PTA for afib.  INR remains at upper end of therapeutic range at 3 on home regimen. Pt is due for lower dose today per home regimen.  No bleeding noted.    - Home dose 7.5mg  daily  except 11.25 MWF (admit INR 1.6 but missed dose 3/9)  Goal of Therapy:  INR 2-3 Monitor platelets by anticoagulation protocol: Yes   Plan:  Coumadin 7.5mg  PO x1 Daily INR  [employer], Pharm.D., BCPS Clinical Pharmacist Pager: [phone] Clinical phone for 06/04/2018 from 7:30-3:00 is [phone].  **Pharmacist phone directory can now be found on [email or web address] ([email or web address]).  Listed under [place].  06/04/2018 11:20 AM

## 2018-06-04 NOTE — IPOC Note (Signed)
Overall Plan of Care [facility]) Patient Details Name: [name] MRN: [id number] DOB: [birth year]  Admitting Diagnosis: <principal problem not specified>  Hospital Problems: Active Problems:   Normal pressure hydrocephalus (HCC)     Functional Problem List: Nursing Bladder, Bowel, Medication Management, Safety, Motor, Skin Integrity  PT Balance, Endurance, Motor, Pain, Safety  OT Balance, Cognition, Endurance, Motor, Pain, Perception, Safety, Sensory  SLP Cognition  TR         Basic ADL's: OT Grooming, Bathing, Dressing, Toileting     Advanced  ADL's: OT       Transfers: PT Bed Mobility, Bed to Chair, Car, Furniture, Floor  OT Toilet, Tub/Shower     Locomotion: PT Stairs, Emergency planning/management officer, Ambulation     Additional Impairments: OT None  SLP Social Cognition, Communication expression Problem Solving, Memory  TR      Anticipated Outcomes Item Anticipated Outcome  Self Feeding S  Swallowing      Basic self-care  S  Toileting  S   Bathroom Transfers S  Bowel/Bladder  min assist  Transfers  supervision  Locomotion  supervision  Communication  Mod I   Cognition  Supervision  Pain  < 3  Safety/Judgment  Supervision   Therapy Plan: PT Intensity: Minimum of 1-2 x/day ,45 to 90 minutes PT Frequency: 5 out of 7 days PT Duration Estimated Length of Stay: 12-16 days OT Intensity: Minimum of 1-2 x/day, 45 to 90 minutes OT Frequency: 5 out of 7 days OT Duration/Estimated Length of Stay: 12-16 SLP Intensity: Minumum of 1-2 x/day, 30 to 90 minutes SLP Frequency: 3 to 5 out of 7 days SLP Duration/Estimated Length of Stay: 12-16 days     Team Interventions: Nursing Interventions Patient/Family Education, Bowel Management, Skin Care/Wound Management, Bladder Management, Disease Management/Prevention, Medication Management, Cognitive Remediation/Compensation  PT interventions Community reintegration, DME/adaptive equipment instruction, Ambulation/gait  training, Neuromuscular re-education, Stair training, UE/LE Strength taining/ROM, Wheelchair propulsion/positioning, Training and development officer, Discharge planning, Pain management, Therapeutic Activities, UE/LE Coordination activities, Cognitive remediation/compensation, Functional mobility training, Patient/family education, Splinting/orthotics, Therapeutic Exercise  OT Interventions Training and development officer, Community reintegration, Disease mangement/prevention, Neuromuscular re-education, Patient/family education, Self Care/advanced ADL retraining, Splinting/orthotics, UE/LE Coordination activities, Therapeutic Exercise, Wheelchair propulsion/positioning, Cognitive remediation/compensation, Discharge planning, DME/adaptive equipment instruction, Functional mobility training, Pain management, Psychosocial support, Skin care/wound managment, Therapeutic Activities, UE/LE Strength taining/ROM, Visual/perceptual remediation/compensation  SLP Interventions Cognitive remediation/compensation, Environmental controls, Internal/external aids, Cueing hierarchy, Functional tasks, Patient/family education, Speech/Language facilitation  TR Interventions    SW/CM Interventions Discharge Planning, Psychosocial Support, Patient/Family Education   Barriers to Discharge MD  Medical stability  Nursing      PT Decreased caregiver support    OT Decreased caregiver support, Lack of/limited family support unsure if family able to provide care needed at d/c  SLP      SW       Team Discharge Planning: Destination: PT-  ,OT- Home , SLP-Home Projected Follow-up: PT-Home health PT, OT-  Home health OT, SLP-24 hour supervision/assistance, Home Health SLP Projected Equipment Needs: PT-To be determined, OT- Tub/shower seat, SLP-None recommended by SLP Equipment Details: PT- , OT-pt report unreliable; need to confirm home euipment from family Patient/family involved in discharge planning: PT- Patient,  OT-Patient,  SLP-Patient  MD ELOS: 12-15 days Medical Rehab Prognosis:  Excellent Assessment: The patient has been admitted for CIR therapies with the diagnosis of NPH. The team will be addressing functional mobility, strength, stamina, balance, safety, adaptive techniques and equipment, self-care, bowel and bladder mgt, patient and caregiver education, NMR, vestibular  assessment, cognition, visual-perceptual awareness, community reentry. Goals have been set at supervision largely for self-care, mobility, cognition.    Meredith Staggers, MD, FAAPMR      See Team Conference Notes for weekly updates to the plan of care

## 2018-06-05 ENCOUNTER — Inpatient Hospital Stay (HOSPITAL_COMMUNITY): Payer: Medicare Other | Admitting: Speech Pathology

## 2018-06-05 ENCOUNTER — Inpatient Hospital Stay (HOSPITAL_COMMUNITY): Payer: Medicare Other

## 2018-06-05 ENCOUNTER — Inpatient Hospital Stay (HOSPITAL_COMMUNITY): Payer: Medicare Other | Admitting: Occupational Therapy

## 2018-06-05 ENCOUNTER — Encounter (HOSPITAL_COMMUNITY): Payer: Medicare Other | Admitting: Psychology

## 2018-06-05 LAB — PROTIME-INR
INR: 2.8 — ABNORMAL HIGH (ref 0.8–1.2)
Prothrombin Time: 29 seconds — ABNORMAL HIGH (ref 11.4–15.2)

## 2018-06-05 LAB — CBC
HCT: 37.3 % — ABNORMAL LOW (ref 39.0–52.0)
Hemoglobin: 11.7 g/dL — ABNORMAL LOW (ref 13.0–17.0)
MCH: 29.3 pg (ref 26.0–34.0)
MCHC: 31.4 g/dL (ref 30.0–36.0)
MCV: 93.3 fL (ref 80.0–100.0)
Platelets: 300 10*3/uL (ref 150–400)
RBC: 4 MIL/uL — ABNORMAL LOW (ref 4.22–5.81)
RDW: 14.5 % (ref 11.5–15.5)
WBC: 15.8 10*3/uL — ABNORMAL HIGH (ref 4.0–10.5)
nRBC: 0 % (ref 0.0–0.2)

## 2018-06-05 LAB — GLUCOSE, CAPILLARY
Glucose-Capillary: 121 mg/dL — ABNORMAL HIGH (ref 70–99)
Glucose-Capillary: 116 mg/dL — ABNORMAL HIGH (ref 70–99)
Glucose-Capillary: 151 mg/dL — ABNORMAL HIGH (ref 70–99)
Glucose-Capillary: 137 mg/dL — ABNORMAL HIGH (ref 70–99)

## 2018-06-05 MED ORDER — WARFARIN SODIUM 7.5 MG PO TABS
7.5000 mg | ORAL_TABLET | Freq: Once | ORAL | Status: AC
  Administered 2018-06-05: 7.5 mg via ORAL
  Filled 2018-06-05: qty 1

## 2018-06-05 NOTE — Progress Notes (Signed)
[place] PHYSICAL MEDICINE & REHABILITATION PROGRESS NOTE   Subjective/Complaints: Up in bed with OT. No new issues overnight  ROS: Patient denies fever, rash, sore throat, blurred vision, nausea, vomiting, diarrhea, cough, shortness of breath or chest pain, joint or back pain, headache, or mood change.     Objective:   No results found. Recent Labs    06/05/18 0645  WBC 15.8*  HGB 11.7*  HCT 37.3*  PLT 300   No results for input(s): NA, K, CL, CO2, GLUCOSE, BUN, CREATININE, CALCIUM in the last 72 hours.  Intake/Output Summary (Last 24 hours) at 06/05/2018 0834 Last data filed at 06/05/2018 0537 Gross per 24 hour  Intake 600 ml  Output -  Net 600 ml     Physical Exam: Vital Signs Blood pressure (!) 150/79, pulse 72, temperature 98 F (36.7 C), temperature source Oral, resp. rate 16, height 5\' 5"  (1.651 m), weight 91.4 kg, SpO2 98 %. Constitutional: No distress . Vital signs reviewed. HEENT: EOMI, oral membranes moist Neck: supple Cardiovascular: RRR without murmur. No JVD    Respiratory: CTA Bilaterally without wheezes or rales. Normal effort    GI: BS +, non-tender, non-distended    Musculoskeletal:  Comments: Large lipoma right medial biceps region. Neurological: He is alert. Fair insight but limited. Language normal CN intact Motor is 5/5 in bilateral deltoid, bicep, tricep, grip, hip flexor, knee extensors, ankle dorsiflexor and plantar flexor. Sensory exam normal. No tremor/tone Psych: calm   Assessment/Plan: 1. Functional deficits secondary to NPH which require 3+ hours per day of interdisciplinary therapy in a comprehensive inpatient rehab setting.  Physiatrist is providing close team supervision and 24 hour management of active medical problems listed below.  Physiatrist and rehab team continue to assess barriers to discharge/monitor patient progress toward functional and medical goals  Care Tool:  Bathing  Bathing activity did not occur:  Safety/medical concerns Body parts bathed by patient: Right arm, Left arm, Chest, Abdomen, Front perineal area, Right upper leg, Left upper leg, Right lower leg, Left lower leg, Face, Buttocks         Bathing assist Assist Level: Minimal Assistance - Patient > 75%     Upper Body Dressing/Undressing Upper body dressing   What is the patient wearing?: Pull over shirt    Upper body assist Assist Level: Supervision/Verbal cueing    Lower Body Dressing/Undressing Lower body dressing      What is the patient wearing?: Underwear/pull up, Pants     Lower body assist Assist for lower body dressing: Minimal Assistance - Patient > 75%     Toileting Toileting    Toileting assist Assist for toileting: Maximal Assistance - Patient 25 - 49%     Transfers Chair/bed transfer  Transfers assist  Chair/bed transfer activity did not occur: Safety/medical concerns  Chair/bed transfer assist level: Minimal Assistance - Patient > 75%     Locomotion Ambulation   Ambulation assist      Assist level: Moderate Assistance - Patient 50 - 74% Assistive device: Walker-rolling Max distance: 88'   Walk 10 feet activity   Assist     Assist level: Moderate Assistance - Patient - 50 - 74% Assistive device: Walker-rolling   Walk 50 feet activity   Assist Walk 50 feet with 2 turns activity did not occur: Safety/medical concerns  Assist level: Moderate Assistance - Patient - 50 - 74% Assistive device: Walker-rolling    Walk 150 feet activity   Assist Walk 150 feet activity did not occur: Safety/medical  concerns         Walk 10 feet on uneven surface  activity   Assist Walk 10 feet on uneven surfaces activity did not occur: Safety/medical concerns         Wheelchair     Assist Will patient use wheelchair at discharge?: No Type of Wheelchair: Manual    Wheelchair assist level: Supervision/Verbal cueing Max wheelchair distance: 50    Wheelchair 50 feet with  2 turns activity    Assist        Assist Level: Supervision/Verbal cueing   Wheelchair 150 feet activity     Assist Wheelchair 150 feet activity did not occur: Safety/medical concerns        Medical Problem List and Plan: 1.   Gait disorder and mental status changes secondary to normal pressure hydrocephalus  -Continue CIR therapies including PT, OT, and SLP   2. Antithrombotics: -DVT/anticoagulation:Pharmaceutical:Coumadin INR 2.8 -antiplatelet therapy: N/a 3. Pain Management:tylenol prn. 4. Mood:LCSW to follow for evaluation and support. -antipsychotic agents: N/A 5. Neuropsych: This patientis notyet capable of making decisions on hisown behalf. 6. Skin/Wound Care:Routine pressure relief measures. 7. Fluids/Electrolytes/Nutrition: encourage PO  Recent labs WNL  8. H/o CVA/NPH: On coumadin.  9. Paroxymal Afib: Monitor HR bid. Metoprolol resumed today. Continue coumadin.  -HR controlled 3/16 10. HTN: Monitor BP bid--check orthostatic BP. On metoprolol and Imdur.  -bp with some improvement  -resumed losartan  at 25mg  daily 3/13--increased to 50mg  3/15  Continue to hold Lasix,   andcatapres. 11. H/O seizures: On tegretol BID.Repeat levels 9.2. 12. T2DM: Monitor BS ac/hs--continue to use SSi for elevated BS. Blood sugars poorly controlled-- will resume metformin and titrate as indicated  -fair control continues at present.   13. CAD: On Lipitor, Imdur and metoprolol.  11. Leukocytosis: wbc's with further increase to 15k today  -ucx + E coli 4oK  -extend keflex covg for 5 days total  -recheck cbc Wednesday 12. Hyponatremia: Chronic? Off Lexapro now.    -improved to 136 3/14    LOS: 4 days A FACE TO [place] 06/05/2018, 8:34 AM

## 2018-06-05 NOTE — Consult Note (Signed)
Neuropsychological Consultation   Patient:   [name]   DOB:   Jun 10, 1933  MR Number:  [id number]  Location:  [place] [facility] [place] [id number] [place] [address] Dept: [phone] GEX: [phone]           Date of Service:   06/05/2018  Start Time:   9 AM End Time:   10 AM  Provider/Observer:  Ilean Skill, Psy.D.       Clinical Neuropsychologist       Billing Code/Service: [phone]  Chief Complaint:    [name] is an 85 year old male with history of CVA and residual right sided motor weakness, seizures, CAS, T2DM, A flutter, CAD, Meniere's disease, recent bronchitis flare.  Admitted on 3/10/2020m with altered mental status changes with syncope as well as abnormal chewing/mouth movements with eye flutter with onset of confusion.  Family reported issues with urinary incontinence, shuffling gait and falls along with cog decline over past year.  EEG negative for seizure activity.  MRI/MRA revealed old left occipital/PCA territory infarct, chronic occluded L PCA old basal ganglia and left thalamic infarcts, Moderate small vessel disease and finding of mild chronic communicating hydrocephalus.  Patient has persisted with cognitive deficits, confabulations, word finding deficits, poor sitting and standing balance and processing speed.  Patient has improved his awareness over time and orientation has improved recently with CIR.  Reason for Service:  Patient was referred for neuropsychological consultation due to coping and adjustment issues.  Below is the HPI forh the current admission.    [id number] [name] is an46 year old male with history of CVA with residual R-HP and facial droop,seizures, CAS, T2DM, A flutter-Coumadin, CAD, Meniere's disease, recent bronchitis flarewho was admitted via ED on 03/10/20with altered mental status changes with syncope as well as abnormal chewi chewing  movements with eye fluttering. Episode lasted approximately 2 to 5 minutes with onset of confusion. Family reported issues with urinary incontinence,shuffling gaitwith fallsandcognitive declinein the past year.CT head negativefor acute changes.  He was treated with IVF and tegretol levels WNL - 10. He continued to have waxing and waning of symptoms with ongoing confusion. Dr. Cheral Marker consulted due to concerns of seizure activity and question orthostatic symptoms. EEG was negative for epileptiform activity. MRI/MRA brain done revealing old left occipital/PCA territory infarct, chronic occluded L-PCA old basal ganglia and left thalamic infarcts, moderate small vessel disease and findings of mild chronic communicating hydrocephalus. He recommended follow up with neurology past discharge as well as NPH clinic at [facility] due to possibility of NPH. He had another episode last night that resolved without residual side effects.Patient with cognitive deficits with confabulatory speech, word finding deficits, poor sitting and standing balance as well as deficits in processing affecting ADLs and mobility. CIR recommended for follow up therapy.  Current Status:  The patient demonstrated improved orientation over past chart notes.  He was aware of where he was but not clear what actually happened over past week medically.  Patient oriented x4.  Increasing awareness.  At end of visit, patient reached out to shake my hand the pulled it back stating "I guess we are not supposed to do that now."  Patient denied mood changes or significantly increase in stress or difficulty adjusting.    Behavioral Observation: [name]  presents as a 85 y.o.-year-old Right Caucasian Male who appeared his stated age. his dress was Appropriate and he was Well Groomed and his manners were Appropriate  to the situation.  his participation was indicative of Appropriate and Redirectable behaviors.  There were any physical  disabilities noted.  he displayed an appropriate level of cooperation and motivation.     Interactions:    Active Appropriate and Redirectable  Attention:   abnormal and attention span appeared shorter than expected for age  Memory:   abnormal; remote memory intact, recent memory impaired  Visuo-spatial:  not examined  Speech (Volume):  normal  Speech:   normal;   Thought Process:  Coherent and Relevant  Though Content:  WNL; not suicidal and not homicidal  Orientation:   person, place and time/date  Judgment:   Poor  Planning:   Poor  Affect:    Appropriate  Mood:    Dysphoric  Insight:   Fair  Intelligence:   normal  Medical History:   Past Medical History:  Diagnosis Date  . Atrial flutter ([place])   . BPH (benign prostatic hyperplasia)   . Coronary artery disease    a. s/p MI tx with Cypher DES to pRCA in 4/04;  b. Echocardiogram 7/09: Normal LV function.  c. Nuclear study 3/13 no ischemia;  d. ETT 2/14 neg;  e. admx with CP => [facility] (01/30/2013):  pLAD 40-50%, oD1 70-80%, oOM1 40%, pRCA stent patent, mid RCA 30%, EF 60-65%. => med Rx.  . DDD (degenerative disc disease)   . Diabetes mellitus without complication ([place])   . Dyslipidemia   . Hemorrhoids   . Hx MRSA infection    left buttocks abscess  . Hx of echocardiogram    a. Echocardiogram (01/31/2013): Mild focal basal and mild concentric hypertrophy of the septum, EF 50-55%, normal wall motion, grade 1 diastolic dysfunction, trivial AI, MAC, mild LAE, PASP 35  . Hyperlipidemia   . Hypertension   . Hypothyroidism   . Meniere's disease    Status post shunt  . Myocardial infarction ([place]) 2008  . Occlusion and stenosis of carotid artery without mention of cerebral infarction    40-59% on carotid doppler 2014; [place] (01/2013): R 1-39%, L 60-79%  . Rotator cuff injury    chronic rotator cuff injury status post repair  . Seizure disorder ([place])   . Stroke [facility])    a. 01/2013=> post cardiac cath CVA to L post  communicating artery system; R sided weakness  . Syncope    Psychiatric History:  No past history of Psychiatric diagnosis.  Family Med/Psych History:  Family History  Problem Relation Age of Onset  . Heart attack Father 24  . Aneurysm Mother        brain aneurysm  . Coronary artery disease Brother        with CABG  . Diabetes Brother   . Colon cancer Neg Hx   . Colon polyps Neg Hx     Risk of Suicide/Violence: low Patient does not report SI or HI.  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  Admitted on 3/10/2020m with altered mental status changes with syncope as well as abnormal chewing/mouth movements with eye flutter with onset of confusion.  Family reported issues with urinary incontinence, shuffling gait and falls along with cog decline over past year.  EEG negative for seizure activity.  MRI/MRA revealed old left occipital/PCA territory infarct, chronic occluded L PCA old basal ganglia and left thalamic infarcts, Moderate small vessel disease and finding of  mild chronic communicating hydrocephalus.  Patient has persisted with cognitive deficits, confabulations, word finding deficits, poor sitting and standing balance and processing speed.  Patient has improved his awareness over time and orientation has improved recently with CIR.  The patient demonstrated improved orientation over past chart notes.  He was aware of where he was but not clear what actually happened over past week medically.  Patient oriented x4.  Increasing awareness.  At end of visit, patient reached out to shake my hand the pulled it back stating "I guess we are not supposed to do that now."  Patient denied mood changes or significantly increase in stress or difficulty adjusting.           Electronically Signed   _______________________ Ilean Skill,  Psy.D.

## 2018-06-05 NOTE — Progress Notes (Signed)
Occupational Therapy Session Note  Patient Details  Name: [name] MRN: [id number] Date of Birth: July 20, 1933  Today's Date: 06/05/2018 OT Individual Time: 1030-1100 OT Individual Time Calculation (min): 30 min    Short Term Goals: Week 1:  OT Short Term Goal 1 (Week 1): Pt will stand for oral care and face washing to demo improved endurance OT Short Term Goal 2 (Week 1): Pt will orient clothing iwht no more than min VC OT Short Term Goal 3 (Week 1): Pt will complete toilet transfer wiht LRAD and MIN A OT Short Term Goal 4 (Week 1): Pt will completes 2/3 steps of toileting with min A  Skilled Therapeutic Interventions/Progress Updates:    Upon entering the room, pt supine in bed but agreeable to OT intervention. Pt with no c/o pain. Pt performed supine >sit with supervision and sit >stand with min guard. Pt ambulating 100' into day room with RW and min guard for balance. Pt needing min cuing to attend to items on R side while ambulating. Pt standing on scale to get weight with min - mod A for standing balance without use of RW. Pt taking seated rest break and returning to room in same manner. Pt ambulating into bathroom for toileting needs with min A and needing min A for balance with clothing management and hygiene. Pt returning to bed at end of session with call bell and all needs within reach.   Therapy Documentation Precautions:  Precautions Precautions: Fall Restrictions Weight Bearing Restrictions: No General:   Vital Signs: Therapy Vitals Pulse Rate: 76 BP: (!) 148/84 Pain: Pain Assessment Pain Scale: 0-10 Pain Score: 2  ADL: ADL Eating: Modified independent Grooming: Minimal assistance Where Assessed-Grooming: Standing at sink Upper Body Bathing: Supervision/safety Where Assessed-Upper Body Bathing: Wheelchair, Sitting at sink Lower Body Bathing: Minimal assistance Where Assessed-Lower Body Bathing: Sitting at sink, Standing at sink, Wheelchair Upper Body  Dressing: Supervision/safety Where Assessed-Upper Body Dressing: Sitting at sink, Wheelchair Lower Body Dressing: Minimal assistance Where Assessed-Lower Body Dressing: Sitting at sink, Standing at sink, Medical laboratory scientific officer: Moderate assistance Toilet Transfer Method: Stand pivot Vision   Perception    Praxis   Exercises:   Other Treatments:     Therapy/Group: Individual Therapy  Gypsy Decant 06/05/2018, 12:43 PM

## 2018-06-05 NOTE — Progress Notes (Signed)
Occupational Therapy Session Note  Patient Details  Name: [name] MRN: [id number] Date of Birth: 07-Nov-1933  Today's Date: 06/05/2018 OT Individual Time: 5894-8347 OT Individual Time Calculation (min): 60 min    Short Term Goals: Week 1:  OT Short Term Goal 1 (Week 1): Pt will stand for oral care and face washing to demo improved endurance OT Short Term Goal 2 (Week 1): Pt will orient clothing iwht no more than min VC OT Short Term Goal 3 (Week 1): Pt will complete toilet transfer wiht LRAD and MIN A OT Short Term Goal 4 (Week 1): Pt will completes 2/3 steps of toileting with min A  Skilled Therapeutic Interventions/Progress Updates:    Pt received supine with no c/o pain requesting to be set up for breakfast. Pt transitioned to EOB with min lifting A. Pt required min cueing to position utensil in R hand to cut meat. Pt ultimately unable to cut meat with knife in either hand and was able to take appropriate sized bites instead. Mod A to power up from EOB with cueing for hand placement on RW. Pt completed ~5 ft of functional mobility with min A and cueing for sequencing/RW management. Pt completed UB bathing and dressing with set up assist. Pt able to stand at sink and complete peri hygiene for ~10 min with 1 posterior LOB requiring min A to recover. Pt able to don depends and pants with min A for standing balance support, using figure 4 method to thread B LE. Pt completed oral care at sink seated with (S). Pt used RW and completed functional mobility into bathroom with moderate cueing for RW management. Pt transferred to toilet with min A. Min A to reach distally for pants in standing. Pt returned supine with all needs met, bed alarm set.   Therapy Documentation Precautions:  Precautions Precautions: Fall Restrictions Weight Bearing Restrictions: No Pain:  No reports of pain throughout session.   Therapy/Group: Individual Therapy  Curtis Sites 06/05/2018, 7:14 AM

## 2018-06-05 NOTE — Care Management (Signed)
[facility] Individual Statement of Services  Patient Name:  [name]  Date:  06/05/2018  Welcome to the [facility].  Our goal is to provide you with an individualized program based on your diagnosis and situation, designed to meet your specific needs.  With this comprehensive rehabilitation program, you will be expected to participate in at least 3 hours of rehabilitation therapies Monday-Friday, with modified therapy programming on the weekends.  Your rehabilitation program will include the following services:  Physical Therapy (PT), Occupational Therapy (OT), Speech Therapy (ST), 24 hour per day rehabilitation nursing, Therapeutic Recreaction (TR), Neuropsychology, Case Management (Social Worker), Rehabilitation Medicine, Nutrition Services and Pharmacy Services  Weekly team conferences will be held on Tuesdays to discuss your progress.  Your Social Worker will talk with you frequently to get your input and to update you on team discussions.  Team conferences with you and your family in attendance may also be held.  Expected length of stay: 12-16 days   Overall anticipated outcome: supervision  Depending on your progress and recovery, your program may change. Your Social Worker will coordinate services and will keep you informed of any changes. Your Social Worker's name and contact numbers are listed  below.  The following services may also be recommended but are not provided by the [facility] will be made to provide these services after discharge if needed.  Arrangements include referral to agencies that provide these services.  Your insurance has been verified to be:  Medicare; [place] [employer] [place] Your primary doctor is:  [place]  Pertinent information will be shared with your doctor and your insurance  company.  Social Worker:  [employer], [place] or ([phone]   Information discussed with and copy given to patient by: Lennart Pall, 06/05/2018, 3:20 PM

## 2018-06-05 NOTE — Progress Notes (Signed)
Speech Language Pathology Daily Session Note  Patient Details  Name: [name] MRN: [id number] Date of Birth: 10/19/1933  Today's Date: 06/05/2018 SLP Individual Time: 1300-1355 SLP Individual Time Calculation (min): 55 min  Short Term Goals: Week 1: SLP Short Term Goal 1 (Week 1): Patient will recall new, daily information with Min A multimodal cues.  SLP Short Term Goal 2 (Week 1): Patient will demonstrate functional problem solving for mildly complex tasks with Min A verbal cues.  SLP Short Term Goal 3 (Week 1): Patient will utilize word-finding strategies during complex verbal tasks with Mod I.   Skilled Therapeutic Interventions: Skilled treatment session focused on cognitive goals. Upon arrival, patient was asleep while supine in bed. Patient was easily awakened and agreeable to participate in treatment session but was disoriented to time of day. Patient sat EOB and consumed lunch meal with extra time and Mod I for tray set-up and alternated his attention between self-feeding and medication management task for 30 minutes with Mod I. Patient independently recalled his medications and their functions with 40% accuracy. Patient will perform a pill box organization task at next session. Patient left upright in bed with all needs within reach and alarm on. Continue with current plan of care.      Pain No/Denies Pain   Therapy/Group: Individual Therapy  PAYNE, COURTNEY 06/05/2018, 2:39 PM

## 2018-06-05 NOTE — Progress Notes (Signed)
ANTICOAGULATION CONSULT NOTE - Follow Up Consult  Pharmacy Consult for Coumadin Indication: atrial fibrillation  No Known Allergies  Patient Measurements: Height: 5\' 5"  (165.1 cm) Weight: 201 lb 8 oz (91.4 kg) IBW/kg (Calculated) : 61.5  Vital Signs: Temp: 98 F (36.7 C) (03/16 0410) Temp Source: Oral (03/16 0410) BP: 148/84 (03/16 0852) Pulse Rate: 76 (03/16 0852)  Labs: Recent Labs    06/03/18 0644 06/04/18 0755 06/05/18 0645  HGB  --   --  11.7*  HCT  --   --  37.3*  PLT  --   --  300  LABPROT 30.4* 30.4* 29.0*  INR 3.0* 3.0* 2.8*    Estimated Creatinine Clearance: 50.2 mL/min (by C-G formula based on SCr of 1.18 mg/dL).   Assessment:  [name] on Coumadin PTA for afib.  INR remains at upper end of therapeutic range at 2.8. No bleeding noted, Hgb down to 11.7.    - Home dose 7.5mg  daily  except 11.25 MWF (admit INR 1.6 but missed dose 3/9)  Goal of Therapy:  INR 2-3 Monitor platelets by anticoagulation protocol: Yes   Plan:  Coumadin 7.5mg  PO x1 Daily INR Monitor for s/sx of bleeding   Renold Genta, PharmD, BCPS Clinical Pharmacist Clinical phone for 06/05/2018 until 3p is [phone] 06/05/2018 1:11 PM  **Pharmacist phone directory can now be found on [email or web address] listed under [place]**

## 2018-06-06 ENCOUNTER — Inpatient Hospital Stay (HOSPITAL_COMMUNITY): Payer: Medicare Other | Admitting: Physical Therapy

## 2018-06-06 ENCOUNTER — Inpatient Hospital Stay (HOSPITAL_COMMUNITY): Payer: Medicare Other

## 2018-06-06 ENCOUNTER — Inpatient Hospital Stay (HOSPITAL_COMMUNITY): Payer: Medicare Other | Admitting: Speech Pathology

## 2018-06-06 LAB — PROTIME-INR
INR: 2.7 — ABNORMAL HIGH (ref 0.8–1.2)
Prothrombin Time: 28.6 seconds — ABNORMAL HIGH (ref 11.4–15.2)

## 2018-06-06 LAB — GLUCOSE, CAPILLARY
Glucose-Capillary: 99 mg/dL (ref 70–99)
Glucose-Capillary: 163 mg/dL — ABNORMAL HIGH (ref 70–99)
Glucose-Capillary: 115 mg/dL — ABNORMAL HIGH (ref 70–99)
Glucose-Capillary: 237 mg/dL — ABNORMAL HIGH (ref 70–99)

## 2018-06-06 MED ORDER — WARFARIN SODIUM 7.5 MG PO TABS
7.5000 mg | ORAL_TABLET | ORAL | Status: DC
  Administered 2018-06-07 – 2018-06-14 (×5): 7.5 mg via ORAL
  Filled 2018-06-06 (×6): qty 1

## 2018-06-06 MED ORDER — WARFARIN SODIUM 10 MG PO TABS
11.2500 mg | ORAL_TABLET | ORAL | Status: DC
  Administered 2018-06-06 – 2018-06-13 (×4): 11.25 mg via ORAL
  Filled 2018-06-06 (×7): qty 1

## 2018-06-06 MED ORDER — LOSARTAN POTASSIUM 50 MG PO TABS
50.0000 mg | ORAL_TABLET | Freq: Two times a day (BID) | ORAL | Status: DC
  Administered 2018-06-06 – 2018-06-15 (×18): 50 mg via ORAL
  Filled 2018-06-06 (×18): qty 1

## 2018-06-06 NOTE — Progress Notes (Signed)
Occupational Therapy Session Note  Patient Details  Name: [name] MRN: [id number] Date of Birth: 08/19/1933  Today's Date: 06/06/2018 OT Individual Time: 4458-4835 OT Individual Time Calculation (min): 70 min    Short Term Goals: Week 1:  OT Short Term Goal 1 (Week 1): Pt will stand for oral care and face washing to demo improved endurance OT Short Term Goal 2 (Week 1): Pt will orient clothing iwht no more than min VC OT Short Term Goal 3 (Week 1): Pt will complete toilet transfer wiht LRAD and MIN A OT Short Term Goal 4 (Week 1): Pt will completes 2/3 steps of toileting with min A  Skilled Therapeutic Interventions/Progress Updates:    Pt received supine in bed reporting he was "soaked". Pt used RW to complete functional mobility into bathroom with CGA. Cueing for RW management as pt is consistently too far away from walker. Pt voided urine further and then transferred into walk in shower with tub bench behind him. Pt stood for 75% of shower with intermittent support on grab bars. Pt able to complete all LB bathing using figure 4 method. Pt used RW to transfer out of shower and had several slight LOB saying he still felt groggy from sleep but no other c/o. Pt donned pants with CGA sit <> stand. Pt able to reach socks with close (S). Pt donned shirt with CGA, preferring to stand to pull down posteriorly. Pt ate breakfast with assistance for set up. Pt was then transported to IADL apt where he completed a simulated walk in shower transfer with CGA provided during stepping. Cueing for visual attention. Pt returned to room and left sitting up in w/c with all needs met, chair belt fastened.   Therapy Documentation Precautions:  Precautions Precautions: Fall Restrictions Weight Bearing Restrictions: No Pain:   No pain reported   Therapy/Group: Individual Therapy  Curtis Sites 06/06/2018, 7:15 AM

## 2018-06-06 NOTE — Progress Notes (Signed)
Physical Therapy Session Note  Patient Details  Name: [name] MRN: [id number] Date of Birth: 10-24-1933  Today's Date: 06/06/2018 PT Individual Time: 1106-1206 PT Individual Time Calculation (min): 60 min   Short Term Goals: Week 1:  PT Short Term Goal 1 (Week 1): pt will consistently perform functional transfers with min A PT Short Term Goal 2 (Week 1): pt will perform gait with min A 100' in controlled environment  Skilled Therapeutic Interventions/Progress Updates:  Pt received in w/c reporting fatigue but agreeable to tx with encouragement. Pt with c/o R shoulder pain with increased flexion ROM & repositioning completed PRN. In gym, pt ambulates 100 ft + 100 ft without AD & min assist with decreased weight shifting R, decreased step length BLE, decreased stride length, decreased foot clearance & heel strike RLE, occasionally mod assist for occasional LOB. Pt stood to reach for then toss horseshoes with task focusing on forced use of RUE, weight shifting onto RLE for NMR & balance with pt requiring min assist overall. Pt reports weakness in RUE/RLE and gait pattern is not worse than it was prior to admission following stroke years ago. Pt completes stand pivot transfers with min assist. Pt utilizes cybex kinetron in sitting then standing with BUE and task focusing on BLE strengthening, dynamic balance, and R NMR. Resistance decreased for task in standing as pt initially reports c/o pain in calves & ultimately task discontinued 2/2 ongoing c/o soreness in BLE. Pt performed sit<>stand from EOM with mod assist with task focusing on anterior weight shifting and BLE strengthening. Pt ambulates in room/bathroom with min assist with cuing to not hold to furniture/walls. Pt performs toilet transfer with min assist, continent void on toilet, and hand hygiene standing at sink with min assist for balance. At end of session pt left sitting in w/c in room with chair alarm donned & all needs in reach.    Therapy Documentation Precautions:  Precautions Precautions: Fall Restrictions Weight Bearing Restrictions: No    Therapy/Group: Individual Therapy  [name] 06/06/2018, 12:17 PM

## 2018-06-06 NOTE — Progress Notes (Signed)
Speech Language Pathology Daily Session Note  Patient Details  Name: [name] MRN: [id number] Date of Birth: 04-01-1933  Today's Date: 06/06/2018 SLP Individual Time: 1325-1420 SLP Individual Time Calculation (min): 55 min  Short Term Goals: Week 1: SLP Short Term Goal 1 (Week 1): Patient will recall new, daily information with Min A multimodal cues.  SLP Short Term Goal 2 (Week 1): Patient will demonstrate functional problem solving for mildly complex tasks with Min A verbal cues.  SLP Short Term Goal 3 (Week 1): Patient will utilize word-finding strategies during complex verbal tasks with Mod I.   Skilled Therapeutic Interventions: Skilled treatment session focused on cognitive goals. SLP facilitated session by providing Mod A verbal cues for functional problem solving and recall during a complex medication management task while organizing a BID pill box. Patient requested to use the bathroom and was able to void. Patient transferred back to bed at end of session and left with alarm on and all needs within reach. Continue with current plan of care.      Pain No/Denies Pain   Therapy/Group: Individual Therapy  PAYNE, COURTNEY 06/06/2018, 3:03 PM

## 2018-06-06 NOTE — Plan of Care (Signed)
  Problem: Consults Goal: RH STROKE PATIENT EDUCATION Description See Patient Education module for education specifics  Outcome: Progressing Goal: Diabetes Guidelines if Diabetic/Glucose > 140 Description If diabetic or lab glucose is > 140 mg/dl - Initiate Diabetes/Hyperglycemia Guidelines & Document Interventions  Outcome: Progressing   Problem: RH BOWEL ELIMINATION Goal: RH STG MANAGE BOWEL WITH ASSISTANCE Description STG Manage Bowel with min Assistance.  Outcome: Progressing Goal: RH STG MANAGE BOWEL W/MEDICATION W/ASSISTANCE Description STG Manage Bowel with Medication with min Assistance.  Outcome: Progressing   Problem: RH BLADDER ELIMINATION Goal: RH STG MANAGE BLADDER WITH ASSISTANCE Description STG Manage Bladder With min Assistance  Outcome: Progressing   Problem: RH SKIN INTEGRITY Goal: RH STG SKIN FREE OF INFECTION/BREAKDOWN Description Patients skin will remain free from further infection or breakdown with min assist.  Outcome: Progressing Goal: RH STG MAINTAIN SKIN INTEGRITY WITH ASSISTANCE Description STG Maintain Skin Integrity With min Assistance.  Outcome: Progressing   Problem: RH SAFETY Goal: RH STG ADHERE TO SAFETY PRECAUTIONS W/ASSISTANCE/DEVICE Description STG Adhere to Safety Precautions With min Assistance/Device.  Outcome: Progressing   Problem: RH COGNITION-NURSING Goal: RH STG USES MEMORY AIDS/STRATEGIES W/ASSIST TO PROBLEM SOLVE Description STG Uses Memory Aids/Strategies With mod Assistance to Problem Solve.  Outcome: Progressing Goal: RH STG ANTICIPATES NEEDS/CALLS FOR ASSIST W/ASSIST/CUES Description STG Anticipates Needs/Calls for Assist With mod Assistance/Cues.  Outcome: Progressing   Problem: RH KNOWLEDGE DEFICIT Goal: RH STG INCREASE KNOWLEDGE OF DIABETES Description Patient/caregiver will verbalize understanding of the management of DM including diet, exercise, medications, and follow up care with mod assist.  Outcome:  Progressing Goal: RH STG INCREASE KNOWLEDGE OF HYPERTENSION Description Patient/caregiver will verbalize understanding of the management of HTN including diet, exercise, medications, and follow up care with mod assist.  Outcome: Progressing Goal: RH STG INCREASE KNOWLEGDE OF HYPERLIPIDEMIA Description Patient/caregiver will verbalize understanding of the management of HLD including diet, exercise, medications, and follow up care with mod assist.  Outcome: Progressing Goal: RH STG INCREASE KNOWLEDGE OF STROKE PROPHYLAXIS Description Patient/caregiver will verbalize understanding of the management of stroke including diet, exercise, medications, and follow up care with mod assist.  Outcome: Progressing

## 2018-06-06 NOTE — Progress Notes (Signed)
ANTICOAGULATION CONSULT NOTE - Follow Up Consult  Pharmacy Consult for Coumadin Indication: atrial fibrillation  No Known Allergies  Patient Measurements: Height: 5\' 5"  (165.1 cm) Weight: 201 lb 8 oz (91.4 kg) IBW/kg (Calculated) : 61.5  Vital Signs: Temp: 97.7 F (36.5 C) (03/17 0346) Temp Source: Oral (03/17 0346) BP: 173/65 (03/17 0346) Pulse Rate: 71 (03/17 0346)  Labs: Recent Labs    06/04/18 0755 06/05/18 0645 06/06/18 0535  HGB  --  11.7*  --   HCT  --  37.3*  --   PLT  --  300  --   LABPROT 30.4* 29.0* 28.6*  INR 3.0* 2.8* 2.7*    Estimated Creatinine Clearance: 50.2 mL/min (by C-G formula based on SCr of 1.18 mg/dL).   Assessment:  [name] on Coumadin PTA for afib.  INR remains therapeutic. We will resume home dose today but with a slightly different schedule.     - Home dose 7.5mg  daily  except 11.25 MWF (admit INR 1.6 but missed dose 3/9)  Goal of Therapy:  INR 2-3 Monitor platelets by anticoagulation protocol: Yes   Plan:  Coumadin 7.5mg  qday except for 11.25mg  Tue, Thurs, Sat Change INR MWF Monitor for s/sx of bleeding

## 2018-06-06 NOTE — Progress Notes (Signed)
[place] PHYSICAL MEDICINE & REHABILITATION PROGRESS NOTE   Subjective/Complaints: Slept well no new complaints  ROS: Patient denies fever, rash, sore throat, blurred vision, nausea, vomiting, diarrhea, cough, shortness of breath or chest pain, joint or back pain, headache, or mood change.   Objective:   No results found. Recent Labs    06/05/18 0645  WBC 15.8*  HGB 11.7*  HCT 37.3*  PLT 300   No results for input(s): NA, K, CL, CO2, GLUCOSE, BUN, CREATININE, CALCIUM in the last 72 hours.  Intake/Output Summary (Last 24 hours) at 06/06/2018 0935 Last data filed at 06/05/2018 1700 Gross per 24 hour  Intake 480 ml  Output -  Net 480 ml     Physical Exam: Vital Signs Blood pressure (!) 173/65, pulse 71, temperature 97.7 F (36.5 C), temperature source Oral, resp. rate 12, height 5\' 5"  (1.651 m), weight 91.4 kg, SpO2 100 %. Constitutional: No distress . Vital signs reviewed. HEENT: EOMI, oral membranes moist Neck: supple Cardiovascular: RRR without murmur. No JVD    Respiratory: CTA Bilaterally without wheezes or rales. Normal effort    GI: BS +, non-tender, non-distended   Musculoskeletal:  Comments:  lipoma right medial biceps region. Neurological: He is alert. Fair insight and awareness. CN intact Motor is 5/5 in bilateral deltoid, bicep, tricep, grip, hip flexor, knee extensors, ankle dorsiflexor and plantar flexor. Sensory exam normal. No tremor/tone noted Psych: cooperative   Assessment/Plan: 1. Functional deficits secondary to NPH which require 3+ hours per day of interdisciplinary therapy in a comprehensive inpatient rehab setting.  Physiatrist is providing close team supervision and 24 hour management of active medical problems listed below.  Physiatrist and rehab team continue to assess barriers to discharge/monitor patient progress toward functional and medical goals  Care Tool:  Bathing  Bathing activity did not occur: Safety/medical  concerns Body parts bathed by patient: Right arm, Left arm, Chest, Abdomen, Front perineal area, Right upper leg, Left upper leg, Right lower leg, Left lower leg, Face, Buttocks         Bathing assist Assist Level: Contact Guard/Touching assist     Upper Body Dressing/Undressing Upper body dressing   What is the patient wearing?: Pull over shirt    Upper body assist Assist Level: Contact Guard/Touching assist    Lower Body Dressing/Undressing Lower body dressing      What is the patient wearing?: Underwear/pull up, Pants     Lower body assist Assist for lower body dressing: Contact Guard/Touching assist     Toileting Toileting    Toileting assist Assist for toileting: Contact Guard/Touching assist     Transfers Chair/bed transfer  Transfers assist  Chair/bed transfer activity did not occur: Safety/medical concerns  Chair/bed transfer assist level: Minimal Assistance - Patient > 75%     Locomotion Ambulation   Ambulation assist      Assist level: Minimal Assistance - Patient > 75% Assistive device: Walker-rolling Max distance: 100'   Walk 10 feet activity   Assist     Assist level: Minimal Assistance - Patient > 75% Assistive device: Walker-rolling   Walk 50 feet activity   Assist Walk 50 feet with 2 turns activity did not occur: Safety/medical concerns  Assist level: Minimal Assistance - Patient > 75% Assistive device: Walker-rolling    Walk 150 feet activity   Assist Walk 150 feet activity did not occur: Safety/medical concerns         Walk 10 feet on uneven surface  activity   Assist Walk 10  feet on uneven surfaces activity did not occur: Safety/medical concerns         Wheelchair     Assist Will patient use wheelchair at discharge?: No Type of Wheelchair: Manual    Wheelchair assist level: Supervision/Verbal cueing Max wheelchair distance: 50    Wheelchair 50 feet with 2 turns activity    Assist         Assist Level: Supervision/Verbal cueing   Wheelchair 150 feet activity     Assist Wheelchair 150 feet activity did not occur: Safety/medical concerns        Medical Problem List and Plan: 1.   Gait disorder and mental status changes secondary to normal pressure hydrocephalus  -Continue CIR therapies including PT, OT, and SLP --team conference today 2. Antithrombotics: -DVT/anticoagulation:Pharmaceutical:Coumadin INR 2.8 -antiplatelet therapy: N/a 3. Pain Management:tylenol prn. 4. Mood:LCSW to follow for evaluation and support. -antipsychotic agents: N/A 5. Neuropsych: This patientis notyet capable of making decisions on hisown behalf. 6. Skin/Wound Care:Routine pressure relief measures. 7. Fluids/Electrolytes/Nutrition: encourage PO  Recent labs WNL  8. H/o CVA/NPH: On coumadin.  9. Paroxymal Afib: Monitor HR bid. Metoprolol resumed today. Continue coumadin.  -HR controlled 3/17 10. HTN: Monitor BP bid--check orthostatic BP. On metoprolol and Imdur.  -bp with some improvement  -increase losartan to home dose of 50mg  bid  Continue to hold Lasix,   andcatapres. 11. H/O seizures: On tegretol BID.Repeat levels 9.2. 12. T2DM: Monitor BS ac/hs--continue to use SSi for elevated BS. Blood sugars poorly controlled-- will resume metformin and titrate as indicated  -fair control continues at present. No changes   13. CAD: On Lipitor, Imdur and metoprolol.  11. Leukocytosis: wbc's with further increase to 15k today  -ucx + E coli 4oK  -extend keflex covg for 5 days total  -recheck cbc tomorrow 12. Hyponatremia: Chronic? Off Lexapro now.    -improved to 136 3/14    LOS: 5 days A FACE TO [place] 06/06/2018, 9:35 AM

## 2018-06-07 ENCOUNTER — Inpatient Hospital Stay (HOSPITAL_COMMUNITY): Payer: Medicare Other | Admitting: Physical Therapy

## 2018-06-07 ENCOUNTER — Inpatient Hospital Stay (HOSPITAL_COMMUNITY): Payer: Medicare Other | Admitting: Speech Pathology

## 2018-06-07 ENCOUNTER — Inpatient Hospital Stay (HOSPITAL_COMMUNITY): Payer: Medicare Other | Admitting: Occupational Therapy

## 2018-06-07 ENCOUNTER — Inpatient Hospital Stay (HOSPITAL_COMMUNITY): Payer: Medicare Other

## 2018-06-07 LAB — BASIC METABOLIC PANEL
Anion gap: 5 (ref 5–15)
BUN: 15 mg/dL (ref 8–23)
CO2: 26 mmol/L (ref 22–32)
Calcium: 8.4 mg/dL — ABNORMAL LOW (ref 8.9–10.3)
Chloride: 104 mmol/L (ref 98–111)
Creatinine, Ser: 1.03 mg/dL (ref 0.61–1.24)
GFR calc Af Amer: >60 mL/min (ref >60)
GFR calc non Af Amer: >60 mL/min (ref >60)
Glucose, Bld: 119 mg/dL — ABNORMAL HIGH (ref 70–99)
Potassium: 4 mmol/L (ref 3.5–5.1)
Sodium: 135 mmol/L (ref 135–145)

## 2018-06-07 LAB — CBC
HCT: 36.6 % — ABNORMAL LOW (ref 39.0–52.0)
Hemoglobin: 12.1 g/dL — ABNORMAL LOW (ref 13.0–17.0)
MCH: 30.4 pg (ref 26.0–34.0)
MCHC: 33.1 g/dL (ref 30.0–36.0)
MCV: 92 fL (ref 80.0–100.0)
Platelets: 375 10*3/uL (ref 150–400)
RBC: 3.98 MIL/uL — ABNORMAL LOW (ref 4.22–5.81)
RDW: 14.2 % (ref 11.5–15.5)
WBC: 11.9 10*3/uL — ABNORMAL HIGH (ref 4.0–10.5)
nRBC: 0 % (ref 0.0–0.2)

## 2018-06-07 LAB — GLUCOSE, CAPILLARY
Glucose-Capillary: 93 mg/dL (ref 70–99)
Glucose-Capillary: 143 mg/dL — ABNORMAL HIGH (ref 70–99)
Glucose-Capillary: 128 mg/dL — ABNORMAL HIGH (ref 70–99)
Glucose-Capillary: 149 mg/dL — ABNORMAL HIGH (ref 70–99)

## 2018-06-07 LAB — PROTIME-INR
INR: 2.5 — ABNORMAL HIGH (ref 0.8–1.2)
Prothrombin Time: 26.2 seconds — ABNORMAL HIGH (ref 11.4–15.2)

## 2018-06-07 MED ORDER — METFORMIN HCL 500 MG PO TABS
250.0000 mg | ORAL_TABLET | Freq: Two times a day (BID) | ORAL | Status: DC
  Administered 2018-06-07 – 2018-06-15 (×16): 250 mg via ORAL
  Filled 2018-06-07 (×17): qty 1

## 2018-06-07 NOTE — Progress Notes (Signed)
Occupational Therapy Session Note  Patient Details  Name: [name] MRN: [id number] Date of Birth: Jan 31, 1934  Today's Date: 06/07/2018 OT Individual Time: 1532-1600 OT Individual Time Calculation (min): 28 min    Short Term Goals: Week 1:  OT Short Term Goal 1 (Week 1): Pt will stand for oral care and face washing to demo improved endurance OT Short Term Goal 2 (Week 1): Pt will orient clothing iwht no more than min VC OT Short Term Goal 3 (Week 1): Pt will complete toilet transfer wiht LRAD and MIN A OT Short Term Goal 4 (Week 1): Pt will completes 2/3 steps of toileting with min A  Skilled Therapeutic Interventions/Progress Updates:    Upon entering the room, pt sleeping soundly in bed but agreeable to OT intervention. Pt moving very slowly and needing increased time to full wake up. Pt performed bed mobility with supervision. Sit >stand with min A and pt ambulating with RW to bathroom for toileting needs. Pt needing min guard for balance with LB clothing management. Pt able to void but unable to have BM. Pt returning to stand at sink to wash hands with min cuing to locate needed items on R side of sink. Pt returning to bed at end of session with supervision for sit >supine. Bed alarm activated and call bell within reach upon exiting the room.   Therapy Documentation Precautions:  Precautions Precautions: Fall Restrictions Weight Bearing Restrictions: No General:   Vital Signs: Therapy Vitals Temp: 97.7 F (36.5 C) Temp Source: Oral Pulse Rate: 73 Resp: 16 BP: 116/73 Patient Position (if appropriate): Lying Oxygen Therapy SpO2: 96 % O2 Device: Room Air Pain: Pain Assessment Pain Scale: 0-10 Pain Score: 0-No pain ADL: ADL Eating: Modified independent Grooming: Minimal assistance Where Assessed-Grooming: Standing at sink Upper Body Bathing: Supervision/safety Where Assessed-Upper Body Bathing: Wheelchair, Sitting at sink Lower Body Bathing: Minimal  assistance Where Assessed-Lower Body Bathing: Sitting at sink, Standing at sink, Wheelchair Upper Body Dressing: Supervision/safety Where Assessed-Upper Body Dressing: Sitting at sink, Wheelchair Lower Body Dressing: Minimal assistance Where Assessed-Lower Body Dressing: Sitting at sink, Standing at sink, Medical laboratory scientific officer: Moderate assistance Toilet Transfer Method: Stand pivot   Therapy/Group: Individual Therapy  Gypsy Decant 06/07/2018, 4:11 PM

## 2018-06-07 NOTE — Plan of Care (Signed)
  Problem: Consults Goal: RH STROKE PATIENT EDUCATION Description See Patient Education module for education specifics  Outcome: Progressing Goal: Diabetes Guidelines if Diabetic/Glucose > 140 Description If diabetic or lab glucose is > 140 mg/dl - Initiate Diabetes/Hyperglycemia Guidelines & Document Interventions  Outcome: Progressing   Problem: RH BOWEL ELIMINATION Goal: RH STG MANAGE BOWEL W/MEDICATION W/ASSISTANCE Description STG Manage Bowel with Medication with min Assistance.  Outcome: Progressing   Problem: RH SAFETY Goal: RH STG ADHERE TO SAFETY PRECAUTIONS W/ASSISTANCE/DEVICE Description STG Adhere to Safety Precautions With min Assistance/Device.  Outcome: Progressing

## 2018-06-07 NOTE — Progress Notes (Signed)
ANTICOAGULATION CONSULT NOTE - Follow Up Consult  Pharmacy Consult for Coumadin Indication: atrial fibrillation  No Known Allergies  Patient Measurements: Height: 5\' 5"  (165.1 cm) Weight: 201 lb 2.1 oz (91.2 kg) IBW/kg (Calculated) : 61.5  Vital Signs: Temp: 98.2 F (36.8 C) (03/18 0419) Temp Source: Oral (03/18 0419) BP: 158/Daniel (03/18 0419) Pulse Rate: 76 (03/18 0419)  Labs: Recent Labs    06/05/18 0645 06/06/18 0535 06/07/18 0442  HGB 11.7*  --  12.1*  HCT 37.3*  --  36.6*  PLT 300  --  375  LABPROT 29.0* 28.6* 26.2*  INR 2.8* 2.7* 2.5*  CREATININE  --   --  1.03    Estimated Creatinine Clearance: 57.4 mL/min (by C-G formula based on SCr of 1.03 mg/dL).  Assessment:  85 yo [name] on Coumadin 7.5mg  daily  except 11.25 MWF for Afib; switched to 11.25mg  on TTS inpatient so far. INR continues to be therapeutic at 2.5 today. Hgb 12.1, plts wnl. Carbamazepine is prob the cause for the higher dose requirement.   Goal of Therapy:  INR 2-3 Monitor platelets by anticoagulation protocol: Yes   Plan:  Continue Coumadin 7.5mg  qday except for 11.25mg  TTS Monitor daily INR three times weekly, CBC, s/s of bleed  Elenor Quinones, PharmD, BCPS, [facility] Clinical Pharmacist Phone number [phone] 06/07/2018 7:42 AM

## 2018-06-07 NOTE — Progress Notes (Signed)
Occupational Therapy Session Note  Patient Details  Name: [name] MRN: [id number] Date of Birth: August 27, 1933  Today's Date: 06/07/2018 OT Individual Time: 1305-1400 OT Individual Time Calculation (min): 55 min    Short Term Goals: Week 1:  OT Short Term Goal 1 (Week 1): Pt will stand for oral care and face washing to demo improved endurance OT Short Term Goal 2 (Week 1): Pt will orient clothing iwht no more than min VC OT Short Term Goal 3 (Week 1): Pt will complete toilet transfer wiht LRAD and MIN A OT Short Term Goal 4 (Week 1): Pt will completes 2/3 steps of toileting with min A  Skilled Therapeutic Interventions/Progress Updates:    Session focused on bathing/dressing tasks at shower level. Pt recevied eating lunch and given 10 additional minute to finish eating. Pt completed functional mobility into bathroom with RW and CGA. Cueing for RW management. Questioning cues provided to encourage pt to sit while doffing clothes, pt unable to come to this conclusion with cueing only and required direct command to sit down on toilet. Pt completed all bathing from sit <> stand level with close (S) provided in standing. Pt transferred back out of shower with CGA using RW and returned to w/c to dress. Pt donned shirt seated with (S). Mod A to don incontinence brief in standing. Pt sat in front of sink and performed oral care and grooming tasks with set up assist. Pt left sitting up in w/c with all needs met, chair alarm belt set.   Therapy Documentation Precautions:  Precautions Precautions: Fall Restrictions Weight Bearing Restrictions: No Pain: Pain Assessment Pain Scale: 0-10 Pain Score: 0-No pain   Therapy/Group: Individual Therapy  Curtis Sites 06/07/2018, 1:44 PM

## 2018-06-07 NOTE — Progress Notes (Signed)
[place] PHYSICAL MEDICINE & REHABILITATION PROGRESS NOTE   Subjective/Complaints: Sleeping when I arrived. Awoke easy. States he was a little restless last night  ROS: Patient denies fever, rash, sore throat, blurred vision, nausea, vomiting, diarrhea, cough, shortness of breath or chest pain, joint or back pain, headache, or mood change.   Objective:   No results found. Recent Labs    06/05/18 0645 06/07/18 0442  WBC 15.8* 11.9*  HGB 11.7* 12.1*  HCT 37.3* 36.6*  PLT 300 375   Recent Labs    06/07/18 0442  NA 135  K 4.0  CL 104  CO2 26  GLUCOSE 119*  BUN 15  CREATININE 1.03  CALCIUM 8.4*    Intake/Output Summary (Last 24 hours) at 06/07/2018 1040 Last data filed at 06/07/2018 0837 Gross per 24 hour  Intake 600 ml  Output -  Net 600 ml     Physical Exam: Vital Signs Blood pressure (!) 158/78, pulse 76, temperature 98.2 F (36.8 C), temperature source Oral, resp. rate 18, height 5\' 5"  (1.651 m), weight 91.2 kg, SpO2 100 %. Constitutional: No distress . Vital signs reviewed. HEENT: EOMI, oral membranes moist Neck: supple Cardiovascular: RRR without murmur. No JVD    Respiratory: CTA Bilaterally without wheezes or rales. Normal effort    GI: BS +, non-tender, non-distended  Musculoskeletal:  Comments:  lipoma right medial biceps region. Neurological: awoke easily Fair insight and awareness. CN intact Motor is 5/5 in bilateral deltoid, bicep, tricep, grip, hip flexor, knee extensors, ankle dorsiflexor and plantar flexor. Sensory exam normal. No tremor/tone noted Psych: cooperative   Assessment/Plan: 1. Functional deficits secondary to NPH which require 3+ hours per day of interdisciplinary therapy in a comprehensive inpatient rehab setting.  Physiatrist is providing close team supervision and 24 hour management of active medical problems listed below.  Physiatrist and rehab team continue to assess barriers to discharge/monitor patient progress  toward functional and medical goals  Care Tool:  Bathing  Bathing activity did not occur: Safety/medical concerns Body parts bathed by patient: Right arm, Left arm, Chest, Abdomen, Front perineal area, Right upper leg, Left upper leg, Right lower leg, Left lower leg, Face, Buttocks         Bathing assist Assist Level: Contact Guard/Touching assist     Upper Body Dressing/Undressing Upper body dressing   What is the patient wearing?: Pull over shirt    Upper body assist Assist Level: Contact Guard/Touching assist    Lower Body Dressing/Undressing Lower body dressing      What is the patient wearing?: Underwear/pull up, Pants     Lower body assist Assist for lower body dressing: Contact Guard/Touching assist     Toileting Toileting    Toileting assist Assist for toileting: Contact Guard/Touching assist     Transfers Chair/bed transfer  Transfers assist  Chair/bed transfer activity did not occur: Safety/medical concerns  Chair/bed transfer assist level: Minimal Assistance - Patient > 75%     Locomotion Ambulation   Ambulation assist      Assist level: Minimal Assistance - Patient > 75% Assistive device: Walker-rolling Max distance: 100'   Walk 10 feet activity   Assist     Assist level: Minimal Assistance - Patient > 75% Assistive device: Walker-rolling   Walk 50 feet activity   Assist Walk 50 feet with 2 turns activity did not occur: Safety/medical concerns  Assist level: Minimal Assistance - Patient > 75% Assistive device: Walker-rolling    Walk 150 feet activity   Assist Walk  150 feet activity did not occur: Safety/medical concerns         Walk 10 feet on uneven surface  activity   Assist Walk 10 feet on uneven surfaces activity did not occur: Safety/medical concerns         Wheelchair     Assist Will patient use wheelchair at discharge?: No Type of Wheelchair: Manual    Wheelchair assist level: Supervision/Verbal  cueing Max wheelchair distance: 50    Wheelchair 50 feet with 2 turns activity    Assist        Assist Level: Supervision/Verbal cueing   Wheelchair 150 feet activity     Assist Wheelchair 150 feet activity did not occur: Safety/medical concerns        Medical Problem List and Plan: 1.   Gait disorder and mental status changes secondary to normal pressure hydrocephalus  -Continue CIR therapies including PT, OT, and SLP  2. Antithrombotics: -DVT/anticoagulation:Pharmaceutical:Coumadin INR 2.8 -antiplatelet therapy: N/a 3. Pain Management:tylenol prn. 4. Mood:LCSW to follow for evaluation and support. -antipsychotic agents: N/A 5. Neuropsych: This patientis notyet capable of making decisions on hisown behalf. 6. Skin/Wound Care:Routine pressure relief measures. 7. Fluids/Electrolytes/Nutrition: encourage PO  Recent labs WNL  8. H/o CVA/NPH: On coumadin.  9. Paroxymal Afib: Monitor HR bid. Metoprolol resumed today. Continue coumadin.  -HR controlled 3/17 10. HTN: Monitor BP bid--check orthostatic BP. On metoprolol and Imdur.  -bp with some improvement  -increased losartan to home dose of 50mg  bid --observe for improvement  Continue to hold Lasix,   andcatapres. 11. H/O seizures: On tegretol BID.Repeat levels 9.2. 12. T2DM: Monitor BS ac/hs--continue to use SSi for elevated BS. Blood sugars poorly controlled-- will resume metformin and titrate as indicated  -fair control continues at present. No changes   13. CAD: On Lipitor, Imdur and metoprolol.  11. Leukocytosis: wbc's decreased to 11.9 3/18  -ucx + E coli 4oK  -extended keflex covg for 5 days total   12. Hyponatremia: Chronic? Off Lexapro now.    -improved to 136 3/14----135 3/18    LOS: 6 days A FACE TO [place] 06/07/2018, 10:40 AM

## 2018-06-07 NOTE — Progress Notes (Signed)
Physical Therapy Session Note  Patient Details  Name: [name] MRN: [id number] Date of Birth: May 04, 1933  Today's Date: 06/07/2018 PT Individual Time: 0915-1030 PT Individual Time Calculation (min): 75 min   Short Term Goals: Week 1:  PT Short Term Goal 1 (Week 1): pt will consistently perform functional transfers with min A PT Short Term Goal 2 (Week 1): pt will perform gait with min A 100' in controlled environment  Skilled Therapeutic Interventions/Progress Updates:    Pt received supine in bed, agreeable to PT session. Supine to sit with CGA. Sit to stand with min A. Min A for standing balance with min A to don pants. Stand pivot transfer bed to w/c with min A. Setup A to brush teeth while seated at sink in w/c. Ambulation x 100', x 150' with no AD and min to mod A. Pt exhibits lateral lean to the R, wide BOS, and decreased RLE clearance with increase in deficits with onset of fatigue. Sit to stand x 10 reps with min A with focus on eccentric control when sitting. Attempt standing alt L/R 4" step-taps, pt unable to maintain standing balance without max A, decreased to 1" step-taps with min to mod A for standing balance for BLE strengthening and coordination. Pt has one instance of near LOB posteriorly, requires max A to recover. Seated hip ext on Kinetron resisted exercise machine, 2 x 10 reps. Attempt to have pt perform standing exercise on Kinetron, unable to maintain standing balance. Pt left seated in w/c in room with needs in reach at end of session.  Therapy Documentation Precautions:  Precautions Precautions: Fall Restrictions Weight Bearing Restrictions: No Pain: Pain Assessment Pain Scale: 0-10 Pain Score: 0-No pain    Therapy/Group: Individual Therapy   Excell Seltzer, PT, DPT  06/07/2018, 4:16 PM

## 2018-06-07 NOTE — Progress Notes (Signed)
Speech Language Pathology Daily Session Note  Patient Details  Name: [name] MRN: [id number] Date of Birth: September 16, 1933  Today's Date: 06/07/2018 SLP Individual Time: 1030-1055 SLP Individual Time Calculation (min): 25 min  Short Term Goals: Week 1: SLP Short Term Goal 1 (Week 1): Patient will recall new, daily information with Min A multimodal cues.  SLP Short Term Goal 2 (Week 1): Patient will demonstrate functional problem solving for mildly complex tasks with Min A verbal cues.  SLP Short Term Goal 3 (Week 1): Patient will utilize word-finding strategies during complex verbal tasks with Mod I.   Skilled Therapeutic Interventions: Skilled treatment session focused on cognitive goals. SLP facilitated session by initiating use of a memory notebook to maximize recall and carryover of functional information. Patient recalled events from previous therapy sessions with Min A verbal cues and asked clinician to record information due to RUE weakness. Patient verbalized understanding and how to utilize notebook appropriately. Patient left upright in wheelchair with alarm on and all needs within reach. Continue with current plan of care.      Pain Pain Assessment Pain Scale: 0-10 Pain Score: 0-No pain  Therapy/Group: Individual Therapy  PAYNE, COURTNEY 06/07/2018, 3:25 PM

## 2018-06-08 ENCOUNTER — Inpatient Hospital Stay (HOSPITAL_COMMUNITY): Payer: Medicare Other

## 2018-06-08 ENCOUNTER — Inpatient Hospital Stay (HOSPITAL_COMMUNITY): Payer: Medicare Other | Admitting: Speech Pathology

## 2018-06-08 LAB — GLUCOSE, CAPILLARY
Glucose-Capillary: 113 mg/dL — ABNORMAL HIGH (ref 70–99)
Glucose-Capillary: 119 mg/dL — ABNORMAL HIGH (ref 70–99)
Glucose-Capillary: 176 mg/dL — ABNORMAL HIGH (ref 70–99)
Glucose-Capillary: 114 mg/dL — ABNORMAL HIGH (ref 70–99)

## 2018-06-08 NOTE — Progress Notes (Signed)
Occupational Therapy Session Note  Patient Details  Name: [name] MRN: [id number] Date of Birth: 1933-05-26  Today's Date: 06/08/2018 OT Individual Time: 0700-0757 OT Individual Time Calculation (min): 57 min    Short Term Goals: Week 1:  OT Short Term Goal 1 (Week 1): Pt will stand for oral care and face washing to demo improved endurance OT Short Term Goal 2 (Week 1): Pt will orient clothing iwht no more than min VC OT Short Term Goal 3 (Week 1): Pt will complete toilet transfer wiht LRAD and MIN A OT Short Term Goal 4 (Week 1): Pt will completes 2/3 steps of toileting with min A  Skilled Therapeutic Interventions/Progress Updates:    1:1. Pt received in bed asleep but easily aroused. Pt with no c/o pain, however reports need to toilet. Pt completes ambultaion to/from bathroom to transfer onto toilet with CGA. Pt requires increased time to void bladder and question cues for safety awareness when standing reaching to floor to grab pants. Pt demo 2 LOB forward requiring min-mod A to recover during clothing management and hand hygiene at sink. Pt eats EOB with VC for locating items on R of tray. BP assessed after meal d/t pt reporting feeling "funny" 147/85. Pt completes toileting 1 more time during session with similar cuing, however demo difficulty following command sit on the EOB and nearly walks out the door of room. Pt comletes changing of clothing at EOB with s/u for shirt and CGA for advancing pants past hips. Pt completes oral care standing at sink with CGA. Exited session with pt seated in w/c, call light in reach and all needs met  Therapy Documentation Precautions:  Precautions Precautions: Fall Restrictions Weight Bearing Restrictions: No General:   Vital Signs: Therapy Vitals Temp: 98.3 F (36.8 C) Temp Source: Oral Pulse Rate: 75 Resp: 18 BP: (!) 182/89 Patient Position (if appropriate): Lying Oxygen Therapy SpO2: 96 % O2 Device: Room Air Pain:    ADL: ADL Eating: Modified independent Grooming: Minimal assistance Where Assessed-Grooming: Standing at sink Upper Body Bathing: Supervision/safety Where Assessed-Upper Body Bathing: Wheelchair, Sitting at sink Lower Body Bathing: Minimal assistance Where Assessed-Lower Body Bathing: Sitting at sink, Standing at sink, Wheelchair Upper Body Dressing: Supervision/safety Where Assessed-Upper Body Dressing: Sitting at sink, Wheelchair Lower Body Dressing: Minimal assistance Where Assessed-Lower Body Dressing: Sitting at sink, Standing at sink, Medical laboratory scientific officer: Moderate assistance Toilet Transfer Method: Stand pivot Vision   Perception    Praxis   Exercises:   Other Treatments:     Therapy/Group: Individual Therapy  [name] 06/08/2018, 7:20 AM

## 2018-06-08 NOTE — Progress Notes (Signed)
ANTICOAGULATION CONSULT NOTE - Follow Up Consult  Pharmacy Consult for Coumadin Indication: atrial fibrillation  No Known Allergies  Patient Measurements: Height: 5\' 5"  (165.1 cm) Weight: 201 lb 2.1 oz (91.2 kg) IBW/kg (Calculated) : 61.5  Vital Signs: Temp: 98.3 F (36.8 C) (03/19 0523) Temp Source: Oral (03/19 0523) BP: 182/89 (03/19 0523) Pulse Rate: 75 (03/19 0523)  Labs: Recent Labs    06/06/18 0535 06/07/18 0442  HGB  --  12.1*  HCT  --  36.6*  PLT  --  375  LABPROT 28.6* 26.2*  INR 2.7* 2.5*  CREATININE  --  1.03    Estimated Creatinine Clearance: 57.4 mL/min (by C-G formula based on SCr of 1.03 mg/dL).  Assessment:  [name] on Coumadin 7.5mg  daily  except 11.25 MWF for Afib; switched to 11.25mg  on TTS inpatient so far. INR continues to be therapeutic at 2.5 on 3/18. Hgb 12.1, plts wnl. Carbamazepine is prob the cause for the higher dose requirement.   Goal of Therapy:  INR 2-3 Monitor platelets by anticoagulation protocol: Yes   Plan:  Continue Coumadin 7.5mg  qday except for 11.25mg  TTS Monitor daily INR three times weekly, CBC, s/s of bleed  Elenor Quinones, PharmD, BCPS, [facility] Clinical Pharmacist Phone number [phone] 06/08/2018 7:55 AM

## 2018-06-08 NOTE — Progress Notes (Signed)
Social Work Patient ID: [name], male   DOB: 01-26-1934, 85 y.o.   MRN: [id number]  Have reviewed team conference with pt and daughter with all aware and agreeable with targeted d/c date of 3/26 and goals of overall supervision.  No concerns at this time.  Continue to follow.  HOYLE, LUCY, LCSW

## 2018-06-08 NOTE — Patient Care Conference (Signed)
Inpatient RehabilitationTeam Conference and Plan of Care Update Date: 06/06/2018   Time: 2:45 PM    Patient Name: [name]      Medical Record Number: [id number]  Date of Birth: February 11, 1934 Sex: Male         Room/Bed: 4W16C/4W16C-01 Payor Info: Payor: MEDICARE / Plan: MEDICARE PART A AND B / Product Type: *No Product type* /    Admitting Diagnosis: old cvs nph  Admit Date/Time:  06/01/2018  3:04 PM Admission Comments: No comment available   Primary Diagnosis:  <principal problem not specified> Principal Problem: <principal problem not specified>  Patient Active Problem List   Diagnosis Date Noted  . Normal pressure hydrocephalus ([place]) 06/01/2018  . Altered mental status 05/31/2018  . AMS (altered mental status) 05/30/2018  . Seizure disorder ([place]) 05/30/2018  . Fracture of proximal phalanx of finger fifth 06/17/2017 07/21/2017  . Upper airway cough syndrome 11/01/2016  . Atrial flutter ([place]) 08/05/2016  . Coronary artery disease involving native coronary artery of native heart without angina pectoris 08/05/2016  . Chronic anticoagulation 12/31/2015  . History of seizures 12/31/2015  . Acute diastolic CHF (congestive heart failure) ([place]) 12/24/2015  . Pulmonary edema   . Paroxysmal atrial fibrillation ([facility])   . Hypothyroidism   . Controlled type 2 diabetes mellitus with complication, without long-term current use of insulin ([place])   . Hypertensive emergency 12/20/2015  . Special screening for malignant neoplasms, colon 12/07/2013  . Bowel habit changes 12/07/2013  . Diarrhea 03/09/2013  . History of cardioembolic cerebrovascular accident (CVA) 01/31/2013  . Chest pain 01/29/2013  . Bilateral carotid artery disease ([place])   . Loss of coordination 12/03/2012  . Morbid obesity due to excess calories ([place]) 08/20/2011  . CAD S/P percutaneous coronary angioplasty   . Dyslipidemia   . Hypertension     Expected Discharge Date: Expected Discharge Date: 06/15/18  Team Members  Present: Physician leading conference: Dr. Alger Simons Social Worker Present: Lennart Pall, LCSW Nurse Present: Leonette Nutting, RN PT Present: Lavone Nian, PT OT Present: Other (comment)(Sandra Rosana Hoes, OT) SLP Present: Weston Anna, SLP PPS Coordinator present : Gunnar Fusi     Current Status/Progress Goal Weekly Team Focus  Medical   admitted with gait difficulties, confusion, NPH.  BP controlled. eating well.  Demonstrating improved cognition  maximize therapy participation  nutrition, bp, sleep-wake cycle   Bowel/Bladder   continent with occasional incontinent episodes at night; LBM 3/15  remain continent of bowel and bladder   assist with toileting as needed    Swallow/Nutrition/ Hydration             ADL's   CGA UB and LB dressing/bathing. Still has frequent LOB and poor RW management  (S) ADLs, min A cognitive goals  dynamic standing balance, use of AD/AE safely, memory and cognitive retraining, and ADL retraining   Mobility   mod assist gait without AD, min assist stand pivot transfers  supervision overall with LRAD  balance, NMR, transfers, gait, safety awareness, strengthening, endurance   Communication             Safety/Cognition/ Behavioral Observations  Mod A  Min A   complex problem solving, recall with use of strategies    Pain   Neck pain (posterior)   control with tylenol; assess pain every shift and PRN medicate as directed   assess pain every shift and as needed medicate as directed    Skin   large lipoma right medial bicep region   pt remain  free of infection or any skin breakdown   assess skin q shift PRN     Rehab Goals Patient on target to meet rehab goals: Yes *See Care Plan and progress notes for long and short-term goals.     Barriers to Discharge  Current Status/Progress Possible Resolutions Date Resolved   Physician    Medical stability               Nursing                  PT  Decreased caregiver support  unsure if pt has family  that can provide supervision              OT                  SLP                SW                Discharge Planning/Teaching Needs:  Home with family to provide 24/7 supervision/ assist.  Family ed to be planned prior to d/c.   Team Discussion:  Still with some incont;  Few bruises and skin tears.  Very impaired memory.  CGA b/d and min/ mod amb without walker.  Frequent LOB with rw.  Supervision goals overall except min with cognition.  Revisions to Treatment Plan:  NA    Continued Need for Acute Rehabilitation Level of Care: The patient requires daily medical management by a physician with specialized training in physical medicine and rehabilitation for the following conditions: Daily direction of a multidisciplinary physical rehabilitation program to ensure safe treatment while eliciting the highest outcome that is of practical value to the patient.: Yes Daily medical management of patient stability for increased activity during participation in an intensive rehabilitation regime.: Yes Daily analysis of laboratory values and/or radiology reports with any subsequent need for medication adjustment of medical intervention for : Neurological problems;Nutritional problems   I attest that I was present, lead the team conference, and concur with the assessment and plan of the team.   HOYLE, LUCY 06/08/2018, 8:36 AM

## 2018-06-08 NOTE — Progress Notes (Signed)
[place] PHYSICAL MEDICINE & REHABILITATION PROGRESS NOTE   Subjective/Complaints: Pt eating breakfast. New issues. Had a good night  ROS: Patient denies fever, rash, sore throat, blurred vision, nausea, vomiting, diarrhea, cough, shortness of breath or chest pain, joint or back pain, headache, or mood change. .   Objective:   No results found. Recent Labs    06/07/18 0442  WBC 11.9*  HGB 12.1*  HCT 36.6*  PLT 375   Recent Labs    06/07/18 0442  NA 135  K 4.0  CL 104  CO2 26  GLUCOSE 119*  BUN 15  CREATININE 1.03  CALCIUM 8.4*    Intake/Output Summary (Last 24 hours) at 06/08/2018 1236 Last data filed at 06/08/2018 0806 Gross per 24 hour  Intake 360 ml  Output -  Net 360 ml     Physical Exam: Vital Signs Blood pressure (!) 182/89, pulse 75, temperature 98.3 F (36.8 C), temperature source Oral, resp. rate 18, height 5\' 5"  (1.651 m), weight 91.2 kg, SpO2 96 %. Constitutional: No distress . Vital signs reviewed. HEENT: EOMI, oral membranes moist Neck: supple Cardiovascular: RRR without murmur. No JVD    Respiratory: CTA Bilaterally without wheezes or rales. Normal effort    GI: BS +, non-tender, non-distended  Musculoskeletal:  Comments:  lipoma right medial biceps region. Neurological: alert, normal language Fair insight and awareness. CN intact Motor is 5/5 in bilateral deltoid, bicep, tricep, grip, hip flexor, knee extensors, ankle dorsiflexor and plantar flexor. Sensory exam normal. No tremor/tone noted Psych: pleasant, cooperative   Assessment/Plan: 1. Functional deficits secondary to NPH which require 3+ hours per day of interdisciplinary therapy in a comprehensive inpatient rehab setting.  Physiatrist is providing close team supervision and 24 hour management of active medical problems listed below.  Physiatrist and rehab team continue to assess barriers to discharge/monitor patient progress toward functional and medical goals  Care  Tool:  Bathing  Bathing activity did not occur: Safety/medical concerns Body parts bathed by patient: Right arm, Left arm, Chest, Abdomen, Front perineal area, Right upper leg, Left upper leg, Right lower leg, Left lower leg, Face, Buttocks         Bathing assist Assist Level: Contact Guard/Touching assist     Upper Body Dressing/Undressing Upper body dressing   What is the patient wearing?: Pull over shirt    Upper body assist Assist Level: Contact Guard/Touching assist    Lower Body Dressing/Undressing Lower body dressing      What is the patient wearing?: Incontinence brief     Lower body assist Assist for lower body dressing: Moderate Assistance - Patient 50 - 74%     Toileting Toileting    Toileting assist Assist for toileting: Contact Guard/Touching assist     Transfers Chair/bed transfer  Transfers assist  Chair/bed transfer activity did not occur: Safety/medical concerns  Chair/bed transfer assist level: Minimal Assistance - Patient > 75%     Locomotion Ambulation   Ambulation assist      Assist level: Minimal Assistance - Patient > 75% Assistive device: Walker-rolling Max distance: 150'   Walk 10 feet activity   Assist     Assist level: Minimal Assistance - Patient > 75% Assistive device: Walker-rolling   Walk 50 feet activity   Assist Walk 50 feet with 2 turns activity did not occur: Safety/medical concerns  Assist level: Minimal Assistance - Patient > 75% Assistive device: Walker-rolling    Walk 150 feet activity   Assist Walk 150 feet activity did not  occur: Safety/medical concerns  Assist level: Minimal Assistance - Patient > 75%      Walk 10 feet on uneven surface  activity   Assist Walk 10 feet on uneven surfaces activity did not occur: Safety/medical concerns         Wheelchair     Assist Will patient use wheelchair at discharge?: No Type of Wheelchair: Manual    Wheelchair assist level:  Supervision/Verbal cueing Max wheelchair distance: 50    Wheelchair 50 feet with 2 turns activity    Assist        Assist Level: Supervision/Verbal cueing   Wheelchair 150 feet activity     Assist Wheelchair 150 feet activity did not occur: Safety/medical concerns        Medical Problem List and Plan: 1.   Gait disorder and mental status changes secondary to normal pressure hydrocephalus  -Continue CIR therapies including PT, OT, and SLP  2. Antithrombotics: -DVT/anticoagulation:Pharmaceutical:Coumadin INR 2.8 -antiplatelet therapy: N/A 3. Pain Management:tylenol prn. 4. Mood:LCSW to follow for evaluation and support. -antipsychotic agents: N/A 5. Neuropsych: This patientis notyet capable of making decisions on hisown behalf. 6. Skin/Wound Care:Routine pressure relief measures. 7. Fluids/Electrolytes/Nutrition: encourage PO  Recent labs WNL  8. H/o CVA/NPH: On coumadin.  9. Paroxymal Afib: Monitor HR bid. Metoprolol resumed today. Continue coumadin.  -HR controlled 3/17 10. HTN: Monitor BP bid--check orthostatic BP. On metoprolol and Imdur.  -bp with some improvement  -increased losartan to home dose of 50mg  bid --with some improvement  Continue to hold Lasix,  Consider resuming catapres. 11. H/O seizures: On tegretol BID.Repeat levels 9.2. 12. T2DM: Monitor BS ac/hs--continue to use SSi for elevated BS. Blood sugars poorly controlled-- will resume metformin and titrate as indicated  -fair control continues at present. No changes   13. CAD: On Lipitor, Imdur and metoprolol.  11. Leukocytosis: wbc's decreased to 11.9 3/18  -ucx + E coli 40K  -extended keflex covg for 5 days total (complete)   12. Hyponatremia: Chronic? Off Lexapro now.    -improved to 136 3/14----135 3/18    LOS: 7 days A FACE TO [place] 06/08/2018, 12:36 PM

## 2018-06-08 NOTE — Progress Notes (Signed)
Occupational Therapy Session Note  Patient Details  Name: [name] MRN: [id number] Date of Birth: 06/04/1933  Today's Date: 06/08/2018 OT Individual Time: 1300-1325 OT Individual Time Calculation (min): 25 min    Short Term Goals: Week 1:  OT Short Term Goal 1 (Week 1): Pt will stand for oral care and face washing to demo improved endurance OT Short Term Goal 2 (Week 1): Pt will orient clothing iwht no more than min VC OT Short Term Goal 3 (Week 1): Pt will complete toilet transfer wiht LRAD and MIN A OT Short Term Goal 4 (Week 1): Pt will completes 2/3 steps of toileting with min A  Skilled Therapeutic Interventions/Progress Updates:    1:1. Pt received asleep with no c/o pain, however pt reporting head "feeling funny" vitals assessed 145/85. Pt completes supine>sitting with VC for sequencing and use of bed rails at CGA level for trunk elevation. Pt compeltes eating meal EOB with 1 small R lean noticeable without BLE on floor. Pt able to correct lean with VC. Pt tansfers into standard chair with no AD and MOD A sit to stand for power up. Exited session with pt seated in chair, call light in reach and belt alarm on.  Therapy Documentation Precautions:  Precautions Precautions: Fall Restrictions Weight Bearing Restrictions: No General:   Vital Signs:   Therapy/Group: Individual Therapy  [name] 06/08/2018, 1:26 PM

## 2018-06-08 NOTE — Progress Notes (Signed)
Speech Language Pathology Weekly Progress and Session Note  Patient Details  Name: [name] MRN: [id number] Date of Birth: 08/17/1933  Beginning of progress report period: June 01, 2018 End of progress report period: June 08, 2018  Today's Date: 06/08/2018 SLP Individual Time: 1115-1130 SLP Individual Time Calculation (min): 15 min and Today's Date: 06/08/2018 SLP Missed Time: 30 Minutes Missed Time Reason: Patient fatigue  Short Term Goals: Week 1: SLP Short Term Goal 1 (Week 1): Patient will recall new, daily information with Min A multimodal cues.  SLP Short Term Goal 1 - Progress (Week 1): Not met SLP Short Term Goal 2 (Week 1): Patient will demonstrate functional problem solving for mildly complex tasks with Min A verbal cues.  SLP Short Term Goal 2 - Progress (Week 1): Not met SLP Short Term Goal 3 (Week 1): Patient will utilize word-finding strategies during complex verbal tasks with Mod I.  SLP Short Term Goal 3 - Progress (Week 1): Met    New Short Term Goals: Week 2: SLP Short Term Goal 1 (Week 2): Patient will recall new, daily information with Min A multimodal cues.  SLP Short Term Goal 2 (Week 2): Patient will demonstrate functional problem solving for mildly complex tasks with Min A verbal cues.   Weekly Progress Updates: Patient has made minimal and inconsistent gains this reporting period. Currently, patient requires overall Mod A verbal cues for functional problem solving with mildly complex but familiar tasks and for recall of information with use of external memory aids. However, patient demonstrates improved word-finding throughout functional conversation. Patient and family education ongoing. Patient would benefit from continued skilled SLP intervention to maximize his cognitive functioning prior to discharge.      Intensity: Minumum of 1-2 x/day, 30 to 90 minutes Frequency: 3 to 5 out of 7 days Duration/Length of Stay: 06/15/18 Treatment/Interventions:  Cognitive remediation/compensation;Environmental controls;Internal/external aids;Cueing hierarchy;Functional tasks;Patient/family education;Speech/Language facilitation   Daily Session  Skilled Therapeutic Interventions: Skilled treatment session focused on cognitive goals. Upon arrival, patient was asleep with head against bed rail and required verbal cues for arousal. Patient reported he did not feel well but was unable to elaborate and just reported feeling "tired and lazy." SLP facilitated session by providing Mod A verbal cues for orientation to date and recall of events from previous therapy sessions with use memory notebook to aid in recall. Patient lethargic throughout session, therefore, patient ended 30 minutes early. Patient left in bed with alarm on and all needs within reach. Continue with current plan of care.        Pain No/Denies Pain   Therapy/Group: Individual Therapy  PAYNE, COURTNEY 06/08/2018, 6:38 AM         

## 2018-06-08 NOTE — Plan of Care (Signed)
  Problem: Consults Goal: RH STROKE PATIENT EDUCATION Description See Patient Education module for education specifics  Outcome: Progressing Goal: Diabetes Guidelines if Diabetic/Glucose > 140 Description If diabetic or lab glucose is > 140 mg/dl - Initiate Diabetes/Hyperglycemia Guidelines & Document Interventions  Outcome: Progressing   Problem: RH SKIN INTEGRITY Goal: RH STG MAINTAIN SKIN INTEGRITY WITH ASSISTANCE Description STG Maintain Skin Integrity With min Assistance.  Outcome: Progressing   Problem: RH SAFETY Goal: RH STG ADHERE TO SAFETY PRECAUTIONS W/ASSISTANCE/DEVICE Description STG Adhere to Safety Precautions With min Assistance/Device.  Outcome: Progressing   Problem: RH COGNITION-NURSING Goal: RH STG ANTICIPATES NEEDS/CALLS FOR ASSIST W/ASSIST/CUES Description STG Anticipates Needs/Calls for Assist With mod Assistance/Cues.  Outcome: Progressing

## 2018-06-08 NOTE — Progress Notes (Signed)
Physical Therapy Session Note  Patient Details  Name: [name] MRN: [id number] Date of Birth: 08/22/1933  Today's Date: 06/08/2018 PT Individual Time: 1400-1500 PT Individual Time Calculation (min): 60 min   Short Term Goals: Week 1:  PT Short Term Goal 1 (Week 1): pt will consistently perform functional transfers with min A PT Short Term Goal 2 (Week 1): pt will perform gait with min A 100' in controlled environment     Skilled Therapeutic Interventions/Progress Updates:  Pt seated in armchair.  PT reviewed schedule with him to orient him to day, date.  Sit> stand with min assist.  Pt stood x 30 seconds with CGA.  Stand pivot to w/c without AD with immediate LOB backwards requiring mod assist.  Seated Therapeutic exercise performed with LEs to increase strength for functional mobility: 20 x 1 bil ankle pumps with extended knees.  Pt needed min cues to count accurately.  Stand pivot w/c > mat with RW, min assist.  Seated heel/toe raises with feet on floor; 20 x 1 bil.   Neuro re-ed: sustained stretch bil heel cords, and balance challenge, standing with forefeet on wedge with bil UE support of RW, x 2 minutes.  Pt stated he needed to use toilet.  Toilet transfer for continent voiding , with RW, min assist.  Returning to standing on floor wedge, pt stood x 3 minutes, during R or L fine motor task of picking up plastic chips and returning to jar.  Pt dropped 2/10 with R hand but was successful on 2nd attempts.  Wihtout any UE support, pt able to balance x 1 minute with frequent poserior sway with delayed but effective response 9/10 times.  Sit> stand and stand pivot transfer to return to w/c from mat, with min assist, 1 cue, and no LOB.  Pt left resting in w/c with needs at hand and seat belt aalrm set.    Therapy Documentation Precautions:  Precautions Precautions: Fall Restrictions Weight Bearing Restrictions: No  Pain: pt denies       Therapy/Group: Individual  Therapy  COOK,CAROLINE 06/08/2018, 3:11 PM

## 2018-06-09 ENCOUNTER — Inpatient Hospital Stay (HOSPITAL_COMMUNITY): Payer: Medicare Other | Admitting: Physical Therapy

## 2018-06-09 ENCOUNTER — Inpatient Hospital Stay (HOSPITAL_COMMUNITY): Payer: Medicare Other | Admitting: Speech Pathology

## 2018-06-09 ENCOUNTER — Inpatient Hospital Stay (HOSPITAL_COMMUNITY): Payer: Medicare Other

## 2018-06-09 ENCOUNTER — Inpatient Hospital Stay (HOSPITAL_COMMUNITY): Payer: Medicare Other | Admitting: Occupational Therapy

## 2018-06-09 LAB — GLUCOSE, CAPILLARY
Glucose-Capillary: 109 mg/dL — ABNORMAL HIGH (ref 70–99)
Glucose-Capillary: 100 mg/dL — ABNORMAL HIGH (ref 70–99)
Glucose-Capillary: 141 mg/dL — ABNORMAL HIGH (ref 70–99)
Glucose-Capillary: 119 mg/dL — ABNORMAL HIGH (ref 70–99)

## 2018-06-09 LAB — PROTIME-INR
INR: 2.4 — ABNORMAL HIGH (ref 0.8–1.2)
Prothrombin Time: 25.6 seconds — ABNORMAL HIGH (ref 11.4–15.2)

## 2018-06-09 MED ORDER — CLONIDINE HCL 0.1 MG PO TABS
0.1000 mg | ORAL_TABLET | Freq: Two times a day (BID) | ORAL | Status: DC
  Administered 2018-06-09 – 2018-06-14 (×10): 0.1 mg via ORAL
  Filled 2018-06-09 (×10): qty 1

## 2018-06-09 MED ORDER — ESCITALOPRAM OXALATE 10 MG PO TABS
5.0000 mg | ORAL_TABLET | Freq: Every day | ORAL | Status: DC
  Administered 2018-06-09 – 2018-06-14 (×6): 5 mg via ORAL
  Filled 2018-06-09 (×6): qty 1

## 2018-06-09 NOTE — Progress Notes (Signed)
Physical Therapy Weekly Progress Note  Patient Details  Name: [name] MRN: [id number] Date of Birth: [birth year]  Beginning of progress report period: June 02, 2018 End of progress report period: June 09, 2018  Today's Date: 06/09/2018 PT Individual Time: 0910-1020 PT Individual Time Calculation (min): 70 min   Patient has met 2 of 2 short term goals.  Pt is making good progress towards LTGs as he currently requires CGA<>min assist for gait with RW. Pt reports RLE/RUE deficits are baseline from stroke several years ago. Pt would benefit from continued skilled PT treatment to focus on transfers, gait, balance, endurance, stair negotiation, caregiver training & d/c planning.   Patient continues to demonstrate the following deficits muscle weakness, decreased cardiorespiratoy endurance, decreased coordination, and decreased standing balance, decreased postural control and decreased balance strategies and therefore will continue to benefit from skilled PT intervention to increase functional independence with mobility.  Patient progressing toward long term goals..  Continue plan of care.  PT Short Term Goals Week 1:  PT Short Term Goal 1 (Week 1): pt will consistently perform functional transfers with min A PT Short Term Goal 1 - Progress (Week 1): Met PT Short Term Goal 2 (Week 1): pt will perform gait with min A 100' in controlled environment PT Short Term Goal 2 - Progress (Week 1): Met Week 2:  PT Short Term Goal 1 (Week 2): STG = LTG due to estimated d/c date.   Skilled Therapeutic Interventions/Progress Updates:  Pt received in bed & agreeable to tx. Pt reports feeling better than yesterday, also some unrated soreness in BLE but denies requesting pain meds. Pt transfers to EOB with supervision with hospital bed features. Pt transfers sit>stand with CGA and ambulates in room & bathroom with RW & CGA. Assisted pt with donning new brief and pt performed hand hygiene at sink with CGA.  In gym, pt completes Berg Balance Test & scores 30/56; educated pt on interpretation of score & current fall risk. Patient demonstrates increased fall risk as noted by score of 30/56 on Berg Balance Scale.  (<36= high risk for falls, close to 100%; 37-45 significant >80%; 46-51 moderate >50%; 52-55 lower >25%). Pt utilized nu-step up to level 6 with all four extremities x 10 minutes with task focusing on global strengthening, endurance, and reciprocal pattern. Pt ambulates back to room with RW & CGA with max cuing to ambulate within base of AD and one instance of LOB with assistance to correct. At end of session pt left sitting in w/c with chair alarm donned & needs at hand.   Therapy Documentation Precautions:  Precautions Precautions: Fall Restrictions Weight Bearing Restrictions: No    Balance: Balance Balance Assessed: Yes Standardized Balance Assessment Standardized Balance Assessment: Berg Balance Test Berg Balance Test Sit to Stand: Able to stand without using hands and stabilize independently Standing Unsupported: Able to stand 2 minutes with supervision Sitting with Back Unsupported but Feet Supported on Floor or Stool: Able to sit safely and securely 2 minutes Stand to Sit: Sits safely with minimal use of hands Transfers: Able to transfer with verbal cueing and /or supervision Standing Unsupported with Eyes Closed: Able to stand 10 seconds with supervision Standing Ubsupported with Feet Together: Needs help to attain position but able to stand for 30 seconds with feet together(unable to have B feet touching 2/2 genu valgum) From Standing, Reach Forward with Outstretched Arm: Reaches forward but needs supervision From Standing Position, Pick up Object from Floor: Able to pick up  shoe, needs supervision From Standing Position, Turn to Look Behind Over each Shoulder: Needs supervision when turning Turn 360 Degrees: Needs close supervision or verbal cueing Standing Unsupported,  Alternately Place Feet on Step/Stool: Able to complete >2 steps/needs minimal assist Standing Unsupported, One Foot in Front: Needs help to step but can hold 15 seconds Standing on One Leg: Tries to lift leg/unable to hold 3 seconds but remains standing independently(elects to lift LLE & stand on RLE, demonstrating decreased safety awareness) Total Score: 30   Therapy/Group: Individual Therapy  [name] 06/09/2018, 10:43 AM

## 2018-06-09 NOTE — Progress Notes (Signed)
Occupational Therapy Session Note  Patient Details  Name: [name] MRN: [id number] Date of Birth: 03-May-1933  Today's Date: 06/09/2018 OT Individual Time: 1130-1230 OT Individual Time Calculation (min): 60 min    Skilled Therapeutic Interventions/Progress Updates: Patient participated in OT session as follows:  With extra time for processing and STMemory spoke orientation to place, season and situation. Functional mobility with extra time and verbal safety reminders to walk within the parameters of the walker instead of the rolling walker way from his body to go to sink to brush teeth Oral care= extra time to locate items on sink to forward left Toilet transfer via rolling walker and elevated 3:1= close S Toileting = extra time to pull up pants on right side with hemiparesis hand which he stated has decreased sensation as well Tray set up as lunch arrived and patient stated he was hungry and ready to eat.   He was willing and able to open all packages with extra time allow for bilateral coordination in his hands.  His dynamic standing balance, he at times held onto rolling walker with one hand with CGA.    Static balance close S by this clinician.  Patient with no pants or shoes to practice donning/doffing and says he will have ask his family member to bring some.  He was left seated with safety belt engaged in his w/c and call bell within reach.   He verbalized understand of where and how to use call bell.      This clinician wrote in his memory book and encouraged him to read back over it as the day progresses to help with memory.     Therapy Documentation Precautions:  Precautions Precautions: Fall Restrictions Weight Bearing Restrictions: No   Pain: Pain Assessment Pain Scale: 0-10 Pain Score: 0-No pain  Therapy/Group: Individual Therapy  [name] 06/09/2018, 12:33 PM

## 2018-06-09 NOTE — Progress Notes (Signed)
PHYSICAL MEDICINE & REHABILITATION PROGRESS NOTE   Subjective/Complaints: No new complaints. Anxious to get home. Slept well again  ROS: Patient denies fever, rash, sore throat, blurred vision, nausea, vomiting, diarrhea, cough, shortness of breath or chest pain, joint or back pain, headache, or mood change.    Objective:   No results found. Recent Labs    06/07/18 0442  WBC 11.9*  HGB 12.1*  HCT 36.6*  PLT 375   Recent Labs    06/07/18 0442  NA 135  K 4.0  CL 104  CO2 26  GLUCOSE 119*  BUN 15  CREATININE 1.03  CALCIUM 8.4*    Intake/Output Summary (Last 24 hours) at 06/09/2018 0936 Last data filed at 06/09/2018 0842 Gross per 24 hour  Intake 700 ml  Output -  Net 700 ml     Physical Exam: Vital Signs Blood pressure (!) 171/73, pulse 75, temperature 99 F (37.2 C), temperature source Oral, resp. rate 17, height 5\' 5"  (1.651 m), weight 91.2 kg, SpO2 96 %. Constitutional: No distress . Vital signs reviewed. HEENT: EOMI, oral membranes moist Neck: supple Cardiovascular: RRR without murmur. No JVD    Respiratory: CTA Bilaterally without wheezes or rales. Normal effort    GI: BS +, non-tender, non-distended  Musculoskeletal:  Comments:  lipoma right medial biceps region. Neurological: alert, normal language Improving insight. Oriented to date, place, reason he's here. CN intact Motor is 5/5 in bilateral deltoid, bicep, tricep, grip, hip flexor, knee extensors, ankle dorsiflexor and plantar flexor. Sensory exam normal. Neuro-exam stable Psych: pleasant   Assessment/Plan: 1. Functional deficits secondary to NPH which require 3+ hours per day of interdisciplinary therapy in a comprehensive inpatient rehab setting.  Physiatrist is providing close team supervision and 24 hour management of active medical problems listed below.  Physiatrist and rehab team continue to assess barriers to discharge/monitor patient progress toward functional and medical  goals  Care Tool:  Bathing  Bathing activity did not occur: Safety/medical concerns Body parts bathed by patient: Right arm, Left arm, Chest, Abdomen, Front perineal area, Right upper leg, Left upper leg, Right lower leg, Left lower leg, Face, Buttocks         Bathing assist Assist Level: Contact Guard/Touching assist     Upper Body Dressing/Undressing Upper body dressing   What is the patient wearing?: Pull over shirt    Upper body assist Assist Level: Contact Guard/Touching assist    Lower Body Dressing/Undressing Lower body dressing      What is the patient wearing?: Incontinence brief     Lower body assist Assist for lower body dressing: Moderate Assistance - Patient 50 - 74%     Toileting Toileting    Toileting assist Assist for toileting: Contact Guard/Touching assist     Transfers Chair/bed transfer  Transfers assist  Chair/bed transfer activity did not occur: Safety/medical concerns  Chair/bed transfer assist level: Moderate Assistance - Patient 50 - 74%     Locomotion Ambulation   Ambulation assist      Assist level: Minimal Assistance - Patient > 75% Assistive device: Walker-rolling Max distance: 150'   Walk 10 feet activity   Assist     Assist level: Minimal Assistance - Patient > 75% Assistive device: Walker-rolling   Walk 50 feet activity   Assist Walk 50 feet with 2 turns activity did not occur: Safety/medical concerns  Assist level: Minimal Assistance - Patient > 75% Assistive device: Walker-rolling    Walk 150 feet activity   Assist Walk  150 feet activity did not occur: Safety/medical concerns  Assist level: Minimal Assistance - Patient > 75%      Walk 10 feet on uneven surface  activity   Assist Walk 10 feet on uneven surfaces activity did not occur: Safety/medical concerns         Wheelchair     Assist Will patient use wheelchair at discharge?: No Type of Wheelchair: Manual    Wheelchair assist  level: Supervision/Verbal cueing Max wheelchair distance: 50    Wheelchair 50 feet with 2 turns activity    Assist        Assist Level: Supervision/Verbal cueing   Wheelchair 150 feet activity     Assist Wheelchair 150 feet activity did not occur: Safety/medical concerns        Medical Problem List and Plan: 1.   Gait disorder and mental status changes secondary to normal pressure hydrocephalus  -Continue CIR therapies including PT, OT, and SLP  2. Antithrombotics: -DVT/anticoagulation:Pharmaceutical:Coumadin INR 2.4 -antiplatelet therapy: N/A 3. Pain Management:tylenol prn. 4. Mood:LCSW to follow for evaluation and support. -antipsychotic agents: N/A 5. Neuropsych: This patientis notyet capable of making decisions on hisown behalf. 6. Skin/Wound Care:Routine pressure relief measures. 7. Fluids/Electrolytes/Nutrition: encourage PO  Recent labs WNL  8. H/o CVA/NPH: On coumadin.  9. Paroxymal Afib: Monitor HR bid. Metoprolol resumed today. Continue coumadin.  -HR controlled 3/17 10. HTN: Monitor BP bid--check orthostatic BP. On metoprolol and Imdur.  -bp with some improvement  -increased losartan to home dose of 50mg  bid --with some improvement  Continue to hold Lasix  -on catapres 0.2mg  bid at home. Resume at 0.1mg  bid today. 11. H/O seizures: On tegretol BID.Repeat levels 9.2. 12. T2DM: Monitor BS ac/hs--continue to use SSi for elevated BS. Blood sugars poorly controlled--   -improving control  -continue metformin 250mg  bid 13. CAD: On Lipitor, Imdur and metoprolol.  11. Leukocytosis: wbc's decreased to 11.9 3/18  -ucx + E coli 40K  -extended keflex covg for 5 days total (completed)   12. Hyponatremia: Chronic? Off Lexapro now.    -improved to 136 3/14----135 3/18    LOS: 8 days A FACE TO [place] 06/09/2018, 9:36 AM

## 2018-06-09 NOTE — Progress Notes (Signed)
Speech Language Pathology Daily Session Note  Patient Details  Name: [name] MRN: [id number] Date of Birth: 07/29/1933  Today's Date: 06/09/2018 SLP Individual Time: 1035-1100 SLP Individual Time Calculation (min): 25 min  Short Term Goals: Week 2: SLP Short Term Goal 1 (Week 2): Patient will recall new, daily information with Min A multimodal cues.  SLP Short Term Goal 2 (Week 2): Patient will demonstrate functional problem solving for mildly complex tasks with Min A verbal cues.   Skilled Therapeutic Interventions:  Pt was seen for skilled ST targeting cognitive goals.  Pt was able to read information from his memory notebook regarding past events with supervision but needed min question cues to recall this morning's therapies to record new information into his memory notebook.   SLP facilitated the session with basic money management tasks to address problem solving goals.  Pt needed between min and mod assist to count money and make change due to decreased working memory impacting pt's ability to complete mental math calculations.  Pt was left in chair with chair alarm set and call bell within reach.  Continue per current plan of care.    Pain Pain Assessment Pain Scale: 0-10 Pain Score: 0-No pain  Therapy/Group: Individual Therapy  Page, Selinda Orion 06/09/2018, 12:46 PM

## 2018-06-09 NOTE — Progress Notes (Signed)
ANTICOAGULATION CONSULT NOTE - Follow Up Consult  Pharmacy Consult for Coumadin Indication: atrial fibrillation  No Known Allergies  Patient Measurements: Height: 5\' 5"  (165.1 cm) Weight: 201 lb 2.1 oz (91.2 kg) IBW/kg (Calculated) : 61.5  Vital Signs: Temp: 99 F (37.2 C) (03/20 0456) Temp Source: Oral (03/20 0456) BP: 171/73 (03/20 0456) Pulse Rate: 75 (03/20 0456)  Labs: Recent Labs    06/07/18 0442 06/09/18 0511  HGB 12.1*  --   HCT 36.6*  --   PLT 375  --   LABPROT 26.2* 25.6*  INR 2.5* 2.4*  CREATININE 1.03  --     Estimated Creatinine Clearance: 57.4 mL/min (by C-G formula based on SCr of 1.03 mg/dL).  Assessment:  [name] on Coumadin 7.5mg  daily  except 11.25 MWF for Afib; switched to 11.25mg  on TTS inpatient so far. INR continues to be therapeutic at 2.4 on 3/20. Hgb 12.1, plts wnl. Carbamazepine is probably the cause for the higher dose requirement.   Goal of Therapy:  INR 2-3 Monitor platelets by anticoagulation protocol: Yes   Plan:  Continue Coumadin 7.5mg  qday except for 11.25mg  TTS Monitor daily INR three times weekly, CBC, s/s of bleed  Legrand Como, Pharm.D., BCPS, BCIDP Clinical Pharmacist Phone: [phone] Please check AMION for all [place] numbers 06/09/2018, 10:12 AM

## 2018-06-09 NOTE — Progress Notes (Signed)
Occupational Therapy Weekly Progress Note  Patient Details  Name: [name] MRN: [id number] Date of Birth: February 19, 1934  Beginning of progress report period: June 02, 2018 End of progress report period: June 09, 2018  Today's Date: 06/09/2018 OT Individual Time: 1330-1415 OT Individual Time Calculation (min): 45 min    Patient has met 4 of 4 short term goals.  Pt has made good progress this week improving to overall min A-CGA level for mobility during BADLs. Pt continues to require VC for safety awareness during BADLs and mobility as well as VC for functional use of RUE d/t old deficits from prior CVA.   Patient continues to demonstrate the following deficits: muscle weakness, decreased cardiorespiratoy endurance, decreased coordination and decreased motor planning, decreased attention to right, decreased awareness, decreased problem solving, decreased safety awareness, decreased memory and delayed processing and decreased sitting balance, decreased standing balance, decreased postural control, hemiplegia and decreased balance strategies and therefore will continue to benefit from skilled OT intervention to enhance overall performance with BADL.  Patient progressing toward long term goals..  Continue plan of care.  OT Short Term Goals Week 1:  OT Short Term Goal 1 (Week 1): Pt will stand for oral care and face washing to demo improved endurance OT Short Term Goal 1 - Progress (Week 1): Met OT Short Term Goal 2 (Week 1): Pt will orient clothing iwht no more than min VC OT Short Term Goal 2 - Progress (Week 1): Met OT Short Term Goal 3 (Week 1): Pt will complete toilet transfer wiht LRAD and MIN A OT Short Term Goal 3 - Progress (Week 1): Met OT Short Term Goal 4 (Week 1): Pt will completes 2/3 steps of toileting with min A OT Short Term Goal 4 - Progress (Week 1): Met Week 2:  OT Short Term Goal 1 (Week 2): Stg=LTG d/t ELOS  Skilled Therapeutic Interventions/Progress Updates:   1:1. Pt received in w/c with daughter present. Pt unable to recall if pt bathed. OT cued to use memory notebook. Pt declines showering this date, however washes up UB at sink with set up and dons new shirt with MIN A for threading overhead. Pt completes practice step over walk in shower transfer with education on using posterior method with RW. Pt requires MIN A for transfer. Pt would benefit from armrest (BSC) in shower to have to push off for power up. Pt completes standing tolerance activity making no sew blanket standing ~15 min with 1 rest break while cutting and tying knots for dexterity/FMC. Pt ambulates back to room with RW with VC for keeping feet inside walker during turns, body close to walker and head up. Exited session with pt seated in bed, exit alarm on and call light in reach  Therapy Documentation Precautions:  Precautions Precautions: Fall Restrictions Weight Bearing Restrictions: No General:   Vital Signs:  Pain: Pain Assessment Pain Scale: 0-10 Pain Score: 0-No pain ADL: ADL Eating: Modified independent Grooming: Minimal assistance Where Assessed-Grooming: Standing at sink Upper Body Bathing: Supervision/safety Where Assessed-Upper Body Bathing: Wheelchair, Sitting at sink Lower Body Bathing: Minimal assistance Where Assessed-Lower Body Bathing: Sitting at sink, Standing at sink, Wheelchair Upper Body Dressing: Supervision/safety Where Assessed-Upper Body Dressing: Sitting at sink, Wheelchair Lower Body Dressing: Minimal assistance Where Assessed-Lower Body Dressing: Sitting at sink, Standing at sink, Medical laboratory scientific officer: Moderate assistance Toilet Transfer Method: Stand pivot Vision   Perception    Praxis   Exercises:   Other Treatments:     Therapy/Group: Individual  Therapy  [name] 06/09/2018, 12:17 PM

## 2018-06-10 LAB — GLUCOSE, CAPILLARY
Glucose-Capillary: 110 mg/dL — ABNORMAL HIGH (ref 70–99)
Glucose-Capillary: 113 mg/dL — ABNORMAL HIGH (ref 70–99)
Glucose-Capillary: 135 mg/dL — ABNORMAL HIGH (ref 70–99)
Glucose-Capillary: 122 mg/dL — ABNORMAL HIGH (ref 70–99)

## 2018-06-10 NOTE — Progress Notes (Signed)
ANTICOAGULATION CONSULT NOTE - Follow Up Consult  Pharmacy Consult for Coumadin Indication: atrial fibrillation  No Known Allergies  Patient Measurements: Height: 5\' 5"  (165.1 cm) Weight: 201 lb 2.1 oz (91.2 kg) IBW/kg (Calculated) : 61.5  Vital Signs: Temp: 98.4 F (36.9 C) (03/21 0517) Temp Source: Oral (03/21 0517) BP: 187/96 (03/21 0517) Pulse Rate: 79 (03/21 0517)  Labs: Recent Labs    06/09/18 0511  LABPROT 25.6*  INR 2.4*    Estimated Creatinine Clearance: 57.4 mL/min (by C-G formula based on SCr of 1.03 mg/dL).  Assessment:  [name] on Coumadin 7.5mg  daily  except 11.25 MWF for Afib; switched to 11.25mg  on TTS inpatient so far. INR continues to be therapeutic at 2.4 on 3/20. Hgb 12.1, plts wnl. Carbamazepine is probably the cause for the higher dose requirement.   Goal of Therapy:  INR 2-3 Monitor platelets by anticoagulation protocol: Yes   Plan:  Continue Coumadin 7.5mg  qday except for 11.25mg  TTS Monitor INR three times weekly, CBC, s/s of bleed   [name], PharmD PGY1 Pharmacy Resident Phone [phone] 06/10/2018 11:12 AM Please check AMION for all [place] numbers

## 2018-06-10 NOTE — Progress Notes (Signed)
[name] is a 85 y.o. male admitted for CIR with gait disorder and mental status changes secondary to NPH  Past Medical History:  Diagnosis Date  . Atrial flutter ([place])   . BPH (benign prostatic hyperplasia)   . Coronary artery disease    a. s/p MI tx with Cypher DES to pRCA in 4/04;  b. Echocardiogram 7/09: Normal LV function.  c. Nuclear study 3/13 no ischemia;  d. ETT 2/14 neg;  e. admx with CP => [facility] (01/30/2013):  pLAD 40-50%, oD1 70-80%, oOM1 40%, pRCA stent patent, mid RCA 30%, EF 60-65%. => med Rx.  . DDD (degenerative disc disease)   . Diabetes mellitus without complication ([place])   . Dyslipidemia   . Hemorrhoids   . Hx MRSA infection    left buttocks abscess  . Hx of echocardiogram    a. Echocardiogram (01/31/2013): Mild focal basal and mild concentric hypertrophy of the septum, EF 50-55%, normal wall motion, grade 1 diastolic dysfunction, trivial AI, MAC, mild LAE, PASP 35  . Hyperlipidemia   . Hypertension   . Hypothyroidism   . Meniere's disease    Status post shunt  . Myocardial infarction ([place]) 2008  . Occlusion and stenosis of carotid artery without mention of cerebral infarction    40-59% on carotid doppler 2014; [place] (01/2013): R 1-39%, L 60-79%  . Rotator cuff injury    chronic rotator cuff injury status post repair  . Seizure disorder ([place])   . Stroke [facility])    a. 01/2013=> post cardiac cath CVA to L post communicating artery system; R sided weakness  . Syncope       Subjective: No new complaints. No new problems. Slept well. Feeling OK and anxious for discharge  Objective: Vital signs in last 24 hours: Temp:  [97.9 F (36.6 C)-98.4 F (36.9 C)] 98.4 F (36.9 C) (03/21 0517) Pulse Rate:  [72-79] 79 (03/21 0517) Resp:  [18-20] 18 (03/21 0517) BP: (104-187)/(62-96) 187/96 (03/21 0517) SpO2:  [95 %-96 %] 96 % (03/21 0517) Weight change:  Last BM Date: 06/09/18  Intake/Output from previous day: 03/20 0701 - 03/21 0700 In: 24 [P.O.:460] Out: -   Last cbgs: CBG (last 3)  Recent Labs    06/09/18 1657 06/09/18 2124 06/10/18 0630  GLUCAP 141* 119* 110*   Patient Vitals for the past 24 hrs:  BP Temp Temp src Pulse Resp SpO2  06/10/18 0517 (!) 187/96 98.4 F (36.9 C) Oral 79 18 96 %  06/09/18 1926 (!) 153/62 98.2 F (36.8 C) Oral 72 18 96 %  06/09/18 1421 104/78 97.9 F (36.6 C) Oral 77 20 95 %     Physical Exam General: No apparent distress obese alert HEENT: not dry Lungs: Normal effort. Lungs clear to auscultation, no crackles or wheezes. Cardiovascular: Regular rate and rhythm, no edema Abdomen: S/NT/ND; BS(+) Musculoskeletal:  unchanged Neurological: No new neurological deficits Wounds: N/A    Skin: clear   Mental state: Alert, oriented, cooperative    Lab Results: BMET    Component Value Date/Time   NA 135 06/07/2018 0442   K 4.0 06/07/2018 0442   CL 104 06/07/2018 0442   CO2 26 06/07/2018 0442   GLUCOSE 119 (H) 06/07/2018 0442   BUN 15 06/07/2018 0442   CREATININE 1.03 06/07/2018 0442   CREATININE 1.24 (H) 06/10/2016 1229   CALCIUM 8.4 (L) 06/07/2018 0442   GFRNONAA >60 06/07/2018 0442   GFRNONAA 55 (L) 06/10/2016 [place] >60 06/07/2018 8250  GFRAA 63 06/10/2016 1229   CBC    Component Value Date/Time   WBC 11.9 (H) 06/07/2018 0442   RBC 3.98 (L) 06/07/2018 0442   HGB 12.1 (L) 06/07/2018 0442   HCT 36.6 (L) 06/07/2018 0442   HCT 38.5 06/01/2018 0323   PLT 375 06/07/2018 0442   MCV 92.0 06/07/2018 0442   MCH 30.4 06/07/2018 0442   MCHC 33.1 06/07/2018 0442   RDW 14.2 06/07/2018 0442   LYMPHSABS 2.5 06/02/2018 0554   MONOABS 1.2 (H) 06/02/2018 0554   EOSABS 0.6 (H) 06/02/2018 0554   BASOSABS 0.1 06/02/2018 0554   Medications: I have reviewed the patient's current medications.  Lab Results  Component Value Date   INR 2.4 (H) 06/09/2018   INR 2.5 (H) 06/07/2018   INR 2.7 (H) 06/06/2018    Assessment/Plan:  Functional deficits secondary to NPH.  Continue CIR DVT  prophylaxis.  Continue Coumadin PAF.  Continue metoprolol and Coumadin anticoagulation Essential hypertension.  Continue to titrate medications T2DM.  Well-controlled   Length of stay, days: 9  Marletta Lor , MD 06/10/2018, 10:50 AM

## 2018-06-11 ENCOUNTER — Inpatient Hospital Stay (HOSPITAL_COMMUNITY): Payer: Medicare Other

## 2018-06-11 ENCOUNTER — Inpatient Hospital Stay (HOSPITAL_COMMUNITY): Payer: Medicare Other | Admitting: Physical Therapy

## 2018-06-11 LAB — GLUCOSE, CAPILLARY
Glucose-Capillary: 114 mg/dL — ABNORMAL HIGH (ref 70–99)
Glucose-Capillary: 128 mg/dL — ABNORMAL HIGH (ref 70–99)
Glucose-Capillary: 119 mg/dL — ABNORMAL HIGH (ref 70–99)
Glucose-Capillary: 129 mg/dL — ABNORMAL HIGH (ref 70–99)

## 2018-06-11 NOTE — Progress Notes (Signed)
[name] is a 85 y.o. male admitted for CIR with mental status changes and abnormal gait secondary to NPH  Past Medical History:  Diagnosis Date  . Atrial flutter ([place])   . BPH (benign prostatic hyperplasia)   . Coronary artery disease    a. s/p MI tx with Cypher DES to pRCA in 4/04;  b. Echocardiogram 7/09: Normal LV function.  c. Nuclear study 3/13 no ischemia;  d. ETT 2/14 neg;  e. admx with CP => [facility] (01/30/2013):  pLAD 40-50%, oD1 70-80%, oOM1 40%, pRCA stent patent, mid RCA 30%, EF 60-65%. => med Rx.  . DDD (degenerative disc disease)   . Diabetes mellitus without complication ([place])   . Dyslipidemia   . Hemorrhoids   . Hx MRSA infection    left buttocks abscess  . Hx of echocardiogram    a. Echocardiogram (01/31/2013): Mild focal basal and mild concentric hypertrophy of the septum, EF 50-55%, normal wall motion, grade 1 diastolic dysfunction, trivial AI, MAC, mild LAE, PASP 35  . Hyperlipidemia   . Hypertension   . Hypothyroidism   . Meniere's disease    Status post shunt  . Myocardial infarction ([place]) 2008  . Occlusion and stenosis of carotid artery without mention of cerebral infarction    40-59% on carotid doppler 2014; [place] (01/2013): R 1-39%, L 60-79%  . Rotator cuff injury    chronic rotator cuff injury status post repair  . Seizure disorder ([place])   . Stroke [facility])    a. 01/2013=> post cardiac cath CVA to L post communicating artery system; R sided weakness  . Syncope      Subjective: No new complaints. No new problems. Slept well.  Looking forward to discharge  Objective: Vital signs in last 24 hours: Temp:  [97.6 F (36.4 C)-97.9 F (36.6 C)] 97.9 F (36.6 C) (03/22 0441) Pulse Rate:  [70-77] 74 (03/22 0745) Resp:  [15-17] 15 (03/22 0441) BP: (137-186)/(64-80) 146/71 (03/22 0745) SpO2:  [90 %-95 %] 95 % (03/22 0441) Weight change:  Last BM Date: 06/09/18  Intake/Output from previous day: 03/21 0701 - 03/22 0700 In: 682 [P.O.:682] Out: -  Last  cbgs: CBG (last 3)  Recent Labs    06/10/18 1152 06/10/18 1635 06/10/18 2106  GLUCAP 113* 135* 122*   Patient Vitals for the past 24 hrs:  BP Temp Temp src Pulse Resp SpO2  06/11/18 0745 (!) 146/71 - - 74 - -  06/11/18 0441 (!) 186/80 97.9 F (36.6 C) Oral 77 15 95 %  06/10/18 1921 137/64 97.6 F (36.4 C) - 70 17 90 %     Physical Exam General: No apparent distress obese HEENT: not dry Lungs: Normal effort. Lungs clear to auscultation, no crackles or wheezes. Cardiovascular: Regular rate and rhythm, no edema Abdomen: S/NT/ND; BS(+) Musculoskeletal:  unchanged Neurological: No new neurological deficits Wounds: N/A    Skin: clear   Mental state: Alert, oriented, cooperative    Lab Results: BMET    Component Value Date/Time   NA 135 06/07/2018 0442   K 4.0 06/07/2018 0442   CL 104 06/07/2018 0442   CO2 26 06/07/2018 0442   GLUCOSE 119 (H) 06/07/2018 0442   BUN 15 06/07/2018 0442   CREATININE 1.03 06/07/2018 0442   CREATININE 1.24 (H) 06/10/2016 1229   CALCIUM 8.4 (L) 06/07/2018 0442   GFRNONAA >60 06/07/2018 0442   GFRNONAA 55 (L) 06/10/2016 1229   GFRAA >60 06/07/2018 0442   GFRAA 63 06/10/2016 1229   CBC  Component Value Date/Time   WBC 11.9 (H) 06/07/2018 0442   RBC 3.98 (L) 06/07/2018 0442   HGB 12.1 (L) 06/07/2018 0442   HCT 36.6 (L) 06/07/2018 0442   HCT 38.5 06/01/2018 0323   PLT 375 06/07/2018 0442   MCV 92.0 06/07/2018 0442   MCH 30.4 06/07/2018 0442   MCHC 33.1 06/07/2018 0442   RDW 14.2 06/07/2018 0442   LYMPHSABS 2.5 06/02/2018 0554   MONOABS 1.2 (H) 06/02/2018 0554   EOSABS 0.6 (H) 06/02/2018 0554   BASOSABS 0.1 06/02/2018 0554     Medications: I have reviewed the patient's current medications.  Assessment/Plan:  Functional deficits secondary to normal pressure hydrocephalus.  Continue CIR PAF.  Continue metoprolol and Coumadin anticoagulation Essential hypertension.  No change in therapy T2DM.  Remains controlled    Length  of stay, days: 10  Marletta Lor , MD 06/11/2018, 9:58 AM

## 2018-06-11 NOTE — Progress Notes (Signed)
Occupational Therapy Session Note  Patient Details  Name: [name] MRN: [id number] Date of Birth: Jun 25, 1933  Today's Date: 06/11/2018 OT Individual Time: 1400-1430 OT Individual Time Calculation (min): 30 min    Short Term Goals: Week 2:  OT Short Term Goal 1 (Week 2): Stg=LTG d/t ELOS  Skilled Therapeutic Interventions/Progress Updates:    Session focused on bathing and dressing tasks at shower level. Pt received sitting up in w/c with noticeable urine odor. Encouraged pt to take quick shower for hygiene, pt agreed. Pt used RW to transfer into shower with CGA. Pt still requiring cues for LB dressing/doffing while seated instead of standing. Pt completed all bathing in shower standing using grab bars for support. Pt transferred out of shower and donned pants with CGA. (S) for shirt. Pt left supine with all needs met, bed alarm set.   Therapy Documentation Precautions:  Precautions Precautions: Fall Restrictions Weight Bearing Restrictions: No Pain:  No pain reported   Therapy/Group: Individual Therapy  Curtis Sites 06/11/2018, 2:32 PM

## 2018-06-11 NOTE — Progress Notes (Signed)
ANTICOAGULATION CONSULT NOTE - Follow Up Consult  Pharmacy Consult for Coumadin Indication: atrial fibrillation  No Known Allergies  Patient Measurements: Height: 5\' 5"  (165.1 cm) Weight: 201 lb 2.1 oz (91.2 kg) IBW/kg (Calculated) : 61.5  Vital Signs: Temp: 97.9 F (36.6 C) (03/22 0441) Temp Source: Oral (03/22 0441) BP: 186/80 (03/22 0441) Pulse Rate: 77 (03/22 0441)  Labs: Recent Labs    06/09/18 0511  LABPROT 25.6*  INR 2.4*    Estimated Creatinine Clearance: 57.4 mL/min (by C-G formula based on SCr of 1.03 mg/dL).  Assessment:  [name] on Coumadin 7.5mg  daily  except 11.25 MWF for Afib; switched to 11.25mg  on TTS inpatient so far. INR continues to be therapeutic at 2.4 on 3/20. Hgb 12.1, plts wnl. Carbamazepine is probably the cause for the higher dose requirement.   Goal of Therapy:  INR 2-3 Monitor platelets by anticoagulation protocol: Yes   Plan:  Continue Coumadin 7.5mg  qday except for 11.25mg  TTS Monitor INR three times weekly, CBC, s/s of bleed   [name], PharmD PGY1 Pharmacy Resident 06/11/2018 7:43 AM Please check AMION for all [place] numbers

## 2018-06-11 NOTE — Progress Notes (Signed)
Physical Therapy Session Note  Patient Details  Name: [name] MRN: [id number] Date of Birth: February 23, 1934  Today's Date: 06/11/2018 PT Individual Time: 1100-1200 PT Individual Time Calculation (min): 60 min   Short Term Goals: Week 2:  PT Short Term Goal 1 (Week 2): STG = LTG due to estimated d/c date.   Skilled Therapeutic Interventions/Progress Updates: Pt presented sleeping and easily awoken. Pt however awoke disorientated and stating "this isn't my room". Pt becoming more frustrated that he was in a different room. Pt orientated that he was as [facility] rehab but adamant not his room. PTA eventually able to assure pt same room as wt check was dated on white board (3/16) and pt then ambulated to closet to check if his clothes were present. Pt eventually calming down with PTA encouraging pt to ambulate outside of room to check for familiar landmarks. As pt ambulated to nsg station pt indicated familiar but confused as to how he felt to disorientated. Pt ambulated to rehab gym CGA and participated in stair training. Pt stating he's able to reach both rails in front of house to enter. Performed x 4 steps (6in) CGA, one bout step through ascending and step to pattern descending and second bout step to pattern both ascending and descending. Pt then participated in alternating toe taps for wt shifting and 2 bouts of horseshoes while standing on red wedge. Pt performed all activities with CGA to close S and no LOB noted. Pt ambulated to day room and participated in NuStep L7 x 9 min for global conditioning and endurance. Pt ambulated back to room at end of session and requesting to return to bed with supervision assist. Pt able to reposition to comfort and left with call bell within reach, bed alarm on, and needs met.      Therapy Documentation Precautions:  Precautions Precautions: Fall Restrictions Weight Bearing Restrictions: No General:   Vital Signs: Therapy Vitals Temp: (!) 97.4 F (36.3  C) Pulse Rate: 66 Resp: 14 BP: 128/60 Patient Position (if appropriate): Lying Oxygen Therapy SpO2: 97 % O2 Device: Room Air  Therapy/Group: Individual Therapy      , PTA  06/11/2018, 4:04 PM

## 2018-06-12 ENCOUNTER — Inpatient Hospital Stay (HOSPITAL_COMMUNITY): Payer: Medicare Other

## 2018-06-12 ENCOUNTER — Inpatient Hospital Stay (HOSPITAL_COMMUNITY): Payer: Medicare Other | Admitting: Physical Therapy

## 2018-06-12 ENCOUNTER — Inpatient Hospital Stay (HOSPITAL_COMMUNITY): Payer: Medicare Other | Admitting: Speech Pathology

## 2018-06-12 LAB — GLUCOSE, CAPILLARY
Glucose-Capillary: 109 mg/dL — ABNORMAL HIGH (ref 70–99)
Glucose-Capillary: 128 mg/dL — ABNORMAL HIGH (ref 70–99)
Glucose-Capillary: 99 mg/dL (ref 70–99)
Glucose-Capillary: 111 mg/dL — ABNORMAL HIGH (ref 70–99)

## 2018-06-12 LAB — PROTIME-INR
INR: 2.6 — ABNORMAL HIGH (ref 0.8–1.2)
Prothrombin Time: 27.7 s — ABNORMAL HIGH (ref 11.4–15.2)

## 2018-06-12 NOTE — Progress Notes (Signed)
Occupational Therapy Session Note  Patient Details  Name: [name] MRN: [id number] Date of Birth: October 31, 1933  Today's Date: 06/12/2018 OT Individual Time: 1250-1345 OT Individual Time Calculation (min): 55 min    Short Term Goals: Week 2:  OT Short Term Goal 1 (Week 2): Stg=LTG d/t ELOS  Skilled Therapeutic Interventions/Progress Updates:    Initial 20 min of session missed d/t pt eating lunch. Pt completed sit > stand from w/c using RW with (S). Pt completed functional mobility into bathroom with CGA. Pt completed all toileting tasks with close (S). Pt completed 150 of functional mobility out of room and into therapy gym with CGA. Intermittent cueing for RW management. Pt stood and completed B UE coordination task with an added challenge of dynamic standing balance. Pt completed dynamic standing balance activity graded with varying BOS. Pt then completed functional reaching with [facility] task in standing, challenging pt to twist in standing and to reach out of BOS. CGA provided throughout. Pt returned to room and was left sitting up in w/c with all needs met, chair alarm belt fastened.   Therapy Documentation Precautions:  Precautions Precautions: Fall Restrictions Weight Bearing Restrictions: No Pain:  Soreness in knees reported during session, alleviated with rest. No further c/o.    Therapy/Group: Individual Therapy  Curtis Sites 06/12/2018, 7:11 AM

## 2018-06-12 NOTE — Plan of Care (Signed)
  Problem: Consults Goal: RH STROKE PATIENT EDUCATION Description See Patient Education module for education specifics  Outcome: Progressing Goal: Diabetes Guidelines if Diabetic/Glucose > 140 Description If diabetic or lab glucose is > 140 mg/dl - Initiate Diabetes/Hyperglycemia Guidelines & Document Interventions  Outcome: Progressing   Problem: RH BLADDER ELIMINATION Goal: RH STG MANAGE BLADDER WITH ASSISTANCE Description STG Manage Bladder With min Assistance  Outcome: Progressing   Problem: RH SKIN INTEGRITY Goal: RH STG SKIN FREE OF INFECTION/BREAKDOWN Description Patients skin will remain free from further infection or breakdown with min assist.  Outcome: Progressing   Problem: RH SAFETY Goal: RH STG ADHERE TO SAFETY PRECAUTIONS W/ASSISTANCE/DEVICE Description STG Adhere to Safety Precautions With min Assistance/Device.  Outcome: Progressing   Problem: RH COGNITION-NURSING Goal: RH STG ANTICIPATES NEEDS/CALLS FOR ASSIST W/ASSIST/CUES Description STG Anticipates Needs/Calls for Assist With mod Assistance/Cues.  Outcome: Progressing

## 2018-06-12 NOTE — Progress Notes (Signed)
Speech Language Pathology Daily Session Note  Patient Details  Name: [name] MRN: [id number] Date of Birth: [birth year]  Today's Date: 06/12/2018 SLP Individual Time: 0700-0800 SLP Individual Time Calculation (min): 60 min  Short Term Goals: Week 2: SLP Short Term Goal 1 (Week 2): Patient will recall new, daily information with Min A multimodal cues.  SLP Short Term Goal 2 (Week 2): Patient will demonstrate functional problem solving for mildly complex tasks with Min A verbal cues.   Skilled Therapeutic Interventions: Skilled treatment session focused on cognitive goals. SLP facilitated session by providing Max A verbal cues for utilization of his memory notebook to recall events from previous therapy sessions. SLP also facilitated session by providing education in regards to memory compensatory strategies. Patient verbalized ways to incorporate strategies with Min A verbal cues. Patient left upright in wheelchair with all needs within reach and alarm on. Continue with current plan of care.      Pain No/Denies Pain   Therapy/Group: Individual Therapy  PAYNE, COURTNEY 06/12/2018, 2:43 PM

## 2018-06-12 NOTE — Progress Notes (Signed)
ANTICOAGULATION CONSULT NOTE - Follow Up Consult  Pharmacy Consult for Coumadin Indication: atrial fibrillation  No Known Allergies  Patient Measurements: Height: 5\' 5"  (165.1 cm) Weight: 201 lb 2.1 oz (91.2 kg) IBW/kg (Calculated) : 61.5  Vital Signs: Temp: 98.3 F (36.8 C) (03/23 0457) BP: 134/46 (03/23 0457) Pulse Rate: 77 (03/23 0457)  Labs: Recent Labs    06/12/18 0551  LABPROT 27.7*  INR 2.6*    Estimated Creatinine Clearance: 57.4 mL/min (by C-G formula based on SCr of 1.03 mg/dL).  Assessment:  [name] on Coumadin 7.5mg  daily  except 11.25 MWF for Afib; switched to 11.25mg  on TTS inpatient so far. INR continues to be therapeutic at 2.6. Hgb 12.1, plts wnl. Carbamazepine is probably the cause for the higher dose requirement.   Goal of Therapy:  INR 2-3 Monitor platelets by anticoagulation protocol: Yes   Plan:  Continue Coumadin 7.5mg  qday except for 11.25mg  TTS Monitor INR three times weekly, CBC, s/s of bleed  Elenor Quinones, PharmD, BCPS, BCIDP Clinical Pharmacist 06/12/2018 8:03 AM

## 2018-06-12 NOTE — Progress Notes (Signed)
Physical Therapy Session Note  Patient Details  Name: [name] MRN: [id number] Date of Birth: 20-Mar-1934  Today's Date: 06/12/2018 PT Individual Time: 1033-1120 PT Individual Time Calculation (min): 47 min   Short Term Goals: Week 2:  PT Short Term Goal 1 (Week 2): STG = LTG due to estimated d/c date.   Skilled Therapeutic Interventions/Progress Updates:  Pt received in w/c & agreeable to tx, denying c/o pain. Pt ambulates room<>gym with RW & supervision with improving RLE foot clearance but ongoing forward trunk flexion/lean on RW. Pt stood on compliant surface while engaging in simple peg board design with moderate cuing from choice of many; pt with 1 posterior LOB requiring assistance to correct, otherwise pt able to maintain balance with 1 UE support and min assist. Pt negotiates 4 steps x 2 with 1 rail (LUE support) with supervision except for 1 LOB when descending stairs; instructed pt on compensatory pattern for stair negotiation but pt prefers to descend steps with LLE first. Pt completes floor transfer x 2 with CGA then supervision with cuing for sequencing task; educated pt on instances when he should call EMS after experiencing a fall. Back in room pt performs toilet transfer with supervision and continent void. Pt performs peri & hand hygiene without assistance. Pt left sitting in w/c in room with alarm donned & call bell in reach.   Therapy Documentation Precautions:  Precautions Precautions: Fall Restrictions Weight Bearing Restrictions: No    Therapy/Group: Individual Therapy  [name] 06/12/2018, 12:42 PM

## 2018-06-12 NOTE — Progress Notes (Signed)
Physical Therapy Session Note  Patient Details  Name: ISAIS KLIPFEL MRN: [id number] Date of Birth: [birth year]  Today's Date: 06/12/2018 PT Individual Time: 1033-1120 PT Individual Time Calculation (min): 47 min   Short Term Goals: Week 1:  PT Short Term Goal 1 (Week 2): STG = LTG due to estimated d/c date.   Skilled Therapeutic Interventions/Progress Updates:    Patient in w/c in room.  Performed LE therex to include sit <> stand with UE support and min A.  Standing marching, heel raises, hip abduction and mini squats x 10 each.  Seated hamstring stretches x 2 x 20 sec, standing with toes on 2" step for heel cord stretch 2 x 20 sec.  Step taps to 2" step with 1 UE support for balance cues for posture.  Step ups forward and lateral to 2" step x 10.  Patient ambulated to dayroom 100' to perform Nu Step x 7 minutes UE/LE for endurance and strength.  Walker adjusted lower for pt's height.  Patient ambulated to his room x 100' min and cues for posture, stride length and step height.  Patient left in w/c in room with alarm belt on and call bell in reach.   Therapy Documentation Precautions:  Precautions Precautions: Fall Restrictions Weight Bearing Restrictions: No Pain: Pain Assessment Pain Scale: 0-10 Pain Score: 0-No pain Faces Pain Scale: Hurts a little bit Pain Type: Acute pain Pain Location: Knee Pain Orientation: Right;Left Pain Descriptors / Indicators: Aching Pain Onset: With Activity Pain Intervention(s): Ambulation/increased activity;Repositioned    Therapy/Group: Individual Therapy  Reginia Naas  Dixon, PT 06/12/2018, 11:09 AM

## 2018-06-12 NOTE — Progress Notes (Signed)
[place] PHYSICAL MEDICINE & REHABILITATION PROGRESS NOTE   Subjective/Complaints: No new issues this morning. Michela Pitcher he was "ill" with a staff member over the weekend and feels bad about it. Says he was a bit disoriented at the time.   ROS: Patient denies fever, rash, sore throat, blurred vision, nausea, vomiting, diarrhea, cough, shortness of breath or chest pain, joint or back pain, headache, or mood change.     Objective:   No results found. No results for input(s): WBC, HGB, HCT, PLT in the last 72 hours. No results for input(s): NA, K, CL, CO2, GLUCOSE, BUN, CREATININE, CALCIUM in the last 72 hours.  Intake/Output Summary (Last 24 hours) at 06/12/2018 0953 Last data filed at 06/12/2018 0700 Gross per 24 hour  Intake 702 ml  Output -  Net 702 ml     Physical Exam: Vital Signs Blood pressure (!) 147/72, pulse 68, temperature 98.3 F (36.8 C), resp. rate 17, height 5\' 5"  (1.651 m), weight 91.2 kg, SpO2 96 %. Constitutional: No distress . Vital signs reviewed. HEENT: EOMI, oral membranes moist Neck: supple Cardiovascular: RRR without murmur. No JVD    Respiratory: CTA Bilaterally without wheezes or rales. Normal effort    GI: BS +, non-tender, non-distended  Musculoskeletal:  Comments:  lipoma right medial biceps region. Neurological: alert, normal language Improving insight. Oriented to date, place, reason he's here. CN intact Motor is 5/5 in bilateral deltoid, bicep, tricep, grip, hip flexor, knee extensors, ankle dorsiflexor and plantar flexor. Sensory exam normal. Neuro-exam stable Psych: pleasant Skin: bruising on UE's   Assessment/Plan: 1. Functional deficits secondary to NPH which require 3+ hours per day of interdisciplinary therapy in a comprehensive inpatient rehab setting.  Physiatrist is providing close team supervision and 24 hour management of active medical problems listed below.  Physiatrist and rehab team continue to assess barriers to  discharge/monitor patient progress toward functional and medical goals  Care Tool:  Bathing  Bathing activity did not occur: Safety/medical concerns Body parts bathed by patient: Right arm, Left arm, Chest, Abdomen, Front perineal area, Right upper leg, Left upper leg, Right lower leg, Left lower leg, Face, Buttocks         Bathing assist Assist Level: Contact Guard/Touching assist     Upper Body Dressing/Undressing Upper body dressing   What is the patient wearing?: Pull over shirt    Upper body assist Assist Level: Contact Guard/Touching assist    Lower Body Dressing/Undressing Lower body dressing      What is the patient wearing?: Incontinence brief     Lower body assist Assist for lower body dressing: Moderate Assistance - Patient 50 - 74%     Toileting Toileting    Toileting assist Assist for toileting: Minimal Assistance - Patient > 75%     Transfers Chair/bed transfer  Transfers assist  Chair/bed transfer activity did not occur: Safety/medical concerns  Chair/bed transfer assist level: Minimal Assistance - Patient > 75%     Locomotion Ambulation   Ambulation assist      Assist level: Contact Guard/Touching assist Assistive device: Walker-rolling Max distance: 75 ft    Walk 10 feet activity   Assist     Assist level: Contact Guard/Touching assist Assistive device: Walker-rolling   Walk 50 feet activity   Assist Walk 50 feet with 2 turns activity did not occur: Safety/medical concerns  Assist level: Contact Guard/Touching assist Assistive device: Walker-rolling    Walk 150 feet activity   Assist Walk 150 feet activity did not occur:  Safety/medical concerns  Assist level: Minimal Assistance - Patient > 75%      Walk 10 feet on uneven surface  activity   Assist Walk 10 feet on uneven surfaces activity did not occur: Safety/medical concerns         Wheelchair     Assist Will patient use wheelchair at discharge?:  No Type of Wheelchair: Manual    Wheelchair assist level: Supervision/Verbal cueing Max wheelchair distance: 50    Wheelchair 50 feet with 2 turns activity    Assist        Assist Level: Supervision/Verbal cueing   Wheelchair 150 feet activity     Assist Wheelchair 150 feet activity did not occur: Safety/medical concerns        Medical Problem List and Plan: 1.   Gait disorder and mental status changes secondary to normal pressure hydrocephalus  -Continue CIR therapies including PT, OT, and SLP  2. Antithrombotics: -DVT/anticoagulation:Pharmaceutical:Coumadin INR 2.4 -antiplatelet therapy: N/A 3. Pain Management:tylenol prn. 4. Mood:LCSW to follow for evaluation and support. -antipsychotic agents: N/A 5. Neuropsych: This patientis notyet capable of making decisions on hisown behalf. 6. Skin/Wound Care:Routine pressure relief measures. 7. Fluids/Electrolytes/Nutrition: encourage PO  Recent labs  WNL--recheck this week 8. H/o CVA/NPH: On coumadin.  9. Paroxymal Afib: Monitor HR bid. Metoprolol resumed today. Continue coumadin.  -HR controlled 3/17 10. HTN: Monitor BP bid--check orthostatic BP. On metoprolol and Imdur.  -bp with   improvement  -continue losartan 50mg  bid   Continue to hold Lasix  -on catapres 0.2mg  bid at home. Resumed at 0.1mg  bid 3/20. 11. H/O seizures: On tegretol BID.Repeat levels 9.2. 12. T2DM: Monitor BS ac/hs--continue to use SSi for elevated BS. Blood sugars poorly controlled--   -improving control  -continue metformin 250mg  bid 13. CAD: On Lipitor, Imdur and metoprolol.  11. Leukocytosis: wbc's decreased to 11.9 3/18  -ucx + E coli 40K  - keflex covg for 5 days total (completed)   12. Hyponatremia: Chronic? Off Lexapro now.    -improved to 136 3/14----135 3/18    LOS: 11 days A FACE TO [place] 06/12/2018, 9:53 AM

## 2018-06-13 ENCOUNTER — Inpatient Hospital Stay (HOSPITAL_COMMUNITY): Payer: Medicare Other | Admitting: Speech Pathology

## 2018-06-13 ENCOUNTER — Inpatient Hospital Stay (HOSPITAL_COMMUNITY): Payer: Medicare Other

## 2018-06-13 ENCOUNTER — Inpatient Hospital Stay (HOSPITAL_COMMUNITY): Payer: Medicare Other | Admitting: Physical Therapy

## 2018-06-13 LAB — GLUCOSE, CAPILLARY
Glucose-Capillary: 101 mg/dL — ABNORMAL HIGH (ref 70–99)
Glucose-Capillary: 112 mg/dL — ABNORMAL HIGH (ref 70–99)
Glucose-Capillary: 103 mg/dL — ABNORMAL HIGH (ref 70–99)
Glucose-Capillary: 97 mg/dL (ref 70–99)

## 2018-06-13 LAB — CBC
HCT: 35.7 % — ABNORMAL LOW (ref 39.0–52.0)
Hemoglobin: 11.4 g/dL — ABNORMAL LOW (ref 13.0–17.0)
MCH: 29.2 pg (ref 26.0–34.0)
MCHC: 31.9 g/dL (ref 30.0–36.0)
MCV: 91.5 fL (ref 80.0–100.0)
Platelets: 338 10*3/uL (ref 150–400)
RBC: 3.9 MIL/uL — ABNORMAL LOW (ref 4.22–5.81)
RDW: 14.1 % (ref 11.5–15.5)
WBC: 12.8 10*3/uL — ABNORMAL HIGH (ref 4.0–10.5)
nRBC: 0 % (ref 0.0–0.2)

## 2018-06-13 NOTE — Progress Notes (Signed)
[place] PHYSICAL MEDICINE & REHABILITATION PROGRESS NOTE   Subjective/Complaints: Up in bed, just got back from [facility]. No new issues  ROS: Patient denies fever, rash, sore throat, blurred vision, nausea, vomiting, diarrhea, cough, shortness of breath or chest pain, joint or back pain, headache, or mood change.  Objective:   No results found. Recent Labs    06/13/18 0456  WBC 12.8*  HGB 11.4*  HCT 35.7*  PLT 338   No results for input(s): NA, K, CL, CO2, GLUCOSE, BUN, CREATININE, CALCIUM in the last 72 hours.  Intake/Output Summary (Last 24 hours) at 06/13/2018 1319 Last data filed at 06/12/2018 2202 Gross per 24 hour  Intake -  Output 100 ml  Net -100 ml     Physical Exam: Vital Signs Blood pressure (!) 155/64, pulse 70, temperature 97.6 F (36.4 C), temperature source Oral, resp. rate 18, height 5\' 5"  (1.651 m), weight 91.2 kg, SpO2 95 %. Constitutional: No distress . Vital signs reviewed. HEENT: EOMI, oral membranes moist Neck: supple Cardiovascular: RRR without murmur. No JVD    Respiratory: CTA Bilaterally without wheezes or rales. Normal effort    GI: BS +, non-tender, non-distended  Musculoskeletal:  Comments:  lipoma right medial biceps region. Neurological: alert, normal language Improving insight. Oriented to date, place, reason he's here. CN intact Motor is 5/5 in bilateral deltoid, bicep, tricep, grip, hip flexor, knee extensors, ankle dorsiflexor and plantar flexor. Sensory exam normal. Neuro-exam stable Psych: pleasant Skin: bruising dissipating   Assessment/Plan: 1. Functional deficits secondary to NPH which require 3+ hours per day of interdisciplinary therapy in a comprehensive inpatient rehab setting.  Physiatrist is providing close team supervision and 24 hour management of active medical problems listed below.  Physiatrist and rehab team continue to assess barriers to discharge/monitor patient progress toward functional and medical  goals  Care Tool:  Bathing  Bathing activity did not occur: Safety/medical concerns Body parts bathed by patient: Right arm, Left arm, Chest, Abdomen, Front perineal area, Right upper leg, Left upper leg, Right lower leg, Left lower leg, Face, Buttocks         Bathing assist Assist Level: Contact Guard/Touching assist     Upper Body Dressing/Undressing Upper body dressing   What is the patient wearing?: Pull over shirt    Upper body assist Assist Level: Contact Guard/Touching assist    Lower Body Dressing/Undressing Lower body dressing      What is the patient wearing?: Incontinence brief     Lower body assist Assist for lower body dressing: Moderate Assistance - Patient 50 - 74%     Toileting Toileting    Toileting assist Assist for toileting: Minimal Assistance - Patient > 75%     Transfers Chair/bed transfer  Transfers assist  Chair/bed transfer activity did not occur: Safety/medical concerns  Chair/bed transfer assist level: Minimal Assistance - Patient > 75%     Locomotion Ambulation   Ambulation assist      Assist level: Contact Guard/Touching assist Assistive device: Walker-rolling Max distance: 171ft   Walk 10 feet activity   Assist     Assist level: Contact Guard/Touching assist Assistive device: Walker-rolling   Walk 50 feet activity   Assist Walk 50 feet with 2 turns activity did not occur: Safety/medical concerns  Assist level: Contact Guard/Touching assist Assistive device: Walker-rolling    Walk 150 feet activity   Assist Walk 150 feet activity did not occur: Safety/medical concerns  Assist level: Contact Guard/Touching assist Assistive device: Walker-rolling    Walk  10 feet on uneven surface  activity   Assist Walk 10 feet on uneven surfaces activity did not occur: Safety/medical concerns         Wheelchair     Assist Will patient use wheelchair at discharge?: No Type of Wheelchair: Manual     Wheelchair assist level: Supervision/Verbal cueing Max wheelchair distance: 50    Wheelchair 50 feet with 2 turns activity    Assist        Assist Level: Supervision/Verbal cueing   Wheelchair 150 feet activity     Assist Wheelchair 150 feet activity did not occur: Safety/medical concerns        Medical Problem List and Plan: 1.   Gait disorder and mental status changes secondary to normal pressure hydrocephalus  --Interdisciplinary Team Conference today   2. Antithrombotics: -DVT/anticoagulation:Pharmaceutical:Coumadin INR 2.4 -antiplatelet therapy: N/A 3. Pain Management:tylenol prn. 4. Mood:LCSW to follow for evaluation and support. -antipsychotic agents: N/A 5. Neuropsych: This patientis notyet capable of making decisions on hisown behalf. 6. Skin/Wound Care:Routine pressure relief measures. 7. Fluids/Electrolytes/Nutrition: encourage PO  Recent labs  WNL--recheck this week 8. H/o CVA/NPH: On coumadin.  9. Paroxymal Afib: Monitor HR bid. Metoprolol resumed today. Continue coumadin.  -HR controlled 3/17 10. HTN:   On metoprolol and Imdur.  -bp with   Improvement and reasonable control at present  -continue losartan 50mg  bid   Continue to hold Lasix  -on catapres 0.2mg  bid at home. Resumed at 0.1mg  bid 3/20. 11. H/O seizures: On tegretol BID.Repeat levels 9.2. 12. T2DM: Monitor BS ac/hs--continue to use SSi for elevated BS. Blood sugars poorly controlled--   -improving control  -continue metformin 250mg  bid 13. CAD: On Lipitor, Imdur and metoprolol.  11. Leukocytosis: wbc's decreased to 11.9 3/18  -ucx + E coli 40K  - keflex covg for 5 days total (completed)   12. Hyponatremia: Chronic? Off Lexapro now.    -improved to 136 3/14----135 3/18    LOS: 12 days A FACE TO [place] 06/13/2018, 1:19 PM

## 2018-06-13 NOTE — Progress Notes (Signed)
Physical Therapy Session Note  Patient Details  Name: [name] MRN: [id number] Date of Birth: 19-Sep-1933  Today's Date: 06/13/2018 PT Individual Time: 0929-1023 PT Individual Time Calculation (min): 54 min   Short Term Goals: Week 2:  PT Short Term Goal 1 (Week 2): STG = LTG due to estimated d/c date.   Skilled Therapeutic Interventions/Progress Updates:  Pt received in handoff from NT who was assisting pt out of the bathroom. Pt requires cuing to square up to sink with RW to perform hand hygiene. Pt ambulates throughout unit with RW & supervision with min cuing to ambulate within base of AD as pt tends to push it out in front of him. Pt transferred onto mat table & performed BLE bridging for hip extensor & glute strengthening. Pt transitioned to quadruped on mat & elevated one extremity at a time, progressing to bird dog with min assist with task focusing on R NMR and core/trunk strengthening & stability. Forward & retrograde gait x 30 ft x 2 was completed without AD and min assist from therapist with task focusing on weight shifting and dynamic balance. Pt utilized nu-step on level 5 x 10 minutes with all four extremities with task focusing on global strengthening & endurance. At end of session pt left sitting in w/c in room with all need at hand & alarm set.   Therapy Documentation Precautions:  Precautions Precautions: Fall Restrictions Weight Bearing Restrictions: No  Pain: 0/10 at rest, 6/10 in L knee with activity - rest breaks provided PRN & meds requested from RN.   Therapy/Group: Individual Therapy  [name] 06/13/2018, 10:26 AM

## 2018-06-13 NOTE — Plan of Care (Signed)
  Problem: Consults Goal: RH STROKE PATIENT EDUCATION Description See Patient Education module for education specifics  Outcome: Progressing Goal: Diabetes Guidelines if Diabetic/Glucose > 140 Description If diabetic or lab glucose is > 140 mg/dl - Initiate Diabetes/Hyperglycemia Guidelines & Document Interventions  Outcome: Progressing   Problem: RH BOWEL ELIMINATION Goal: RH STG MANAGE BOWEL WITH ASSISTANCE Description STG Manage Bowel with min Assistance.  Outcome: Progressing Goal: RH STG MANAGE BOWEL W/MEDICATION W/ASSISTANCE Description STG Manage Bowel with Medication with min Assistance.  Outcome: Progressing   Problem: RH BLADDER ELIMINATION Goal: RH STG MANAGE BLADDER WITH ASSISTANCE Description STG Manage Bladder With min Assistance  Outcome: Progressing   Problem: RH SKIN INTEGRITY Goal: RH STG SKIN FREE OF INFECTION/BREAKDOWN Description Patients skin will remain free from further infection or breakdown with min assist.  Outcome: Progressing Goal: RH STG MAINTAIN SKIN INTEGRITY WITH ASSISTANCE Description STG Maintain Skin Integrity With min Assistance.  Outcome: Progressing   Problem: RH SAFETY Goal: RH STG ADHERE TO SAFETY PRECAUTIONS W/ASSISTANCE/DEVICE Description STG Adhere to Safety Precautions With min Assistance/Device.  Outcome: Progressing   Problem: RH COGNITION-NURSING Goal: RH STG USES MEMORY AIDS/STRATEGIES W/ASSIST TO PROBLEM SOLVE Description STG Uses Memory Aids/Strategies With mod Assistance to Problem Solve.  Outcome: Progressing Goal: RH STG ANTICIPATES NEEDS/CALLS FOR ASSIST W/ASSIST/CUES Description STG Anticipates Needs/Calls for Assist With mod Assistance/Cues.  Outcome: Progressing   Problem: RH KNOWLEDGE DEFICIT Goal: RH STG INCREASE KNOWLEDGE OF DIABETES Description Patient/caregiver will verbalize understanding of the management of DM including diet, exercise, medications, and follow up care with mod assist.  Outcome:  Progressing Goal: RH STG INCREASE KNOWLEDGE OF HYPERTENSION Description Patient/caregiver will verbalize understanding of the management of HTN including diet, exercise, medications, and follow up care with mod assist.  Outcome: Progressing Goal: RH STG INCREASE KNOWLEGDE OF HYPERLIPIDEMIA Description Patient/caregiver will verbalize understanding of the management of HLD including diet, exercise, medications, and follow up care with mod assist.  Outcome: Progressing Goal: RH STG INCREASE KNOWLEDGE OF STROKE PROPHYLAXIS Description Patient/caregiver will verbalize understanding of the management of stroke including diet, exercise, medications, and follow up care with mod assist.  Outcome: Progressing

## 2018-06-13 NOTE — Progress Notes (Signed)
Physical Therapy Session Note  Patient Details  Name: FERMON URETA MRN: [id number] Date of Birth: 10/12/1933  Today's Date: 06/13/2018 PT Individual Time: 0700-0740 PT Individual Time Calculation (min): 40 min   Short Term Goals: Week 2:  PT Short Term Goal 1 (Week 2): STG = LTG due to estimated d/c date.   Skilled Therapeutic Interventions/Progress Updates:    pt in bed agreeable to therapy.  Pt performs gait throughout unit with RW and supervision.  Gait training without RW for balance with min A for gait fwd/bkwd/sideways with improving balance reactions.  Standing on foam for horseshoe toss with CGA to correct posterior LOB.  Pt performs heel raises and mini squats on foam with min A for posterior LOB. Pt left in room with all needs at hand, alarm set.  Therapy Documentation Precautions:  Precautions Precautions: Fall Restrictions Weight Bearing Restrictions: No Pain:  no c/o pain   Therapy/Group: Individual Therapy  [name],[name] 06/13/2018, 7:40 AM

## 2018-06-13 NOTE — Progress Notes (Signed)
Physical Therapy Session Note  Patient Details  Name: [name] MRN: [id number] Date of Birth: 1933-12-24  Today's Date: 06/13/2018 PT Individual Time: 1101-1132 PT Individual Time Calculation (min): 31 min   Short Term Goals: Week 2:  PT Short Term Goal 1 (Week 2): STG = LTG due to estimated d/c date.   Skilled Therapeutic Interventions/Progress Updates:    Pt received sitting in w/c and agreeable to therapy session. Pt ambulated ~188ft using RW to/from therapy gym with CGA and pt demonstrating increased difficulty with AD management when turning requiring increased cuing to maintain AD close and B LEs inside AD. Pt performed cone weaving with RW, cuing for improved upright posture and decreased weight bearing through B UEs on RW - progressed to no AD with pt demonstrating increased difficulty with R turns without AD requiring intermittent min A for L lateral LOB. Performed repeated sit<>stand without UE support focusing on functional strengthening of  BLEs - verbal/tactile and manual facilitation for motor timing for increased hip extension to improve upright stance and prevent posterior LOB - 2 sets to fatigue, resting when decreased eccentric control noted. Performed alternate  BLE tap on 4" step without UE support with min A for balance and manual facilitation with tactile cuing for improved upright posture and weight shifting - 1 instance of mod A due to L knee giving way due to sudden onset of increased pain to 8-9/10 with pt reporting the pain immediately returns to 0 when he sits and removes the weight through that extremity. Pt ambulated back to room as described above and left sitting in w/c with need in reach, seat belt alarm on, and Speech Therapist arriving.  Therapy Documentation Precautions:  Precautions Precautions: Fall Restrictions Weight Bearing Restrictions: No  Pain: Reports 4-5/10 pain in L knee during standing/weightbearing activity, which pt reports is a chronic  symptom and he reports the pain immediately returns to 0/10 when seated with no weight through that extremity. Pt had 1 instance of increased pain to 8-9/10 as described above. Pt provided rest breaks for pain management and educated on importance of strengthening L LE musculature to increase support for the joint.  Therapy/Group: Individual Therapy  Tawana Scale, PT, DPT 06/13/2018, 10:08 AM

## 2018-06-13 NOTE — Discharge Instructions (Addendum)
Inpatient Rehab Discharge Instructions  [name] Discharge date and time: 06/15/18   Activities/Precautions/ Functional Status: Activity: no lifting, driving, or strenuous exercise till cleared by MD Diet: cardiac diet and diabetic diet Wound Care: none needed    Functional status:  ___ No restrictions     ___ Walk up steps independently _X__ 24/7 supervision/assistance   ___ Walk up steps with assistance ___ Intermittent supervision/assistance  ___ Bathe/dress independently ___ Walk with walker     _X__ Bathe/dress with assistance ___ Walk Independently    ___ Shower independently ___ Walk with assistance    ___ Shower with assistance _X__ No alcohol     ___ Return to work/school ________    Special Instructions: 1. Needs assistance with medication management.  2. Drink plenty of fluids.    COMMUNITY REFERRALS UPON DISCHARGE:    Home Health:   PT     OT     ST    RN                      Agency: [facility]   Phone: [phone]     My questions have been answered and I understand these instructions. I will adhere to these goals and the provided educational materials after my discharge from the hospital.  Patient/Caregiver Signature _______________________________ Date __________  Clinician Signature _______________________________________ Date __________  Please bring this form and your medication list with you to all your follow-up doctor's appointments.        Information on my medicine - Coumadin   (Warfarin)  Why was Coumadin prescribed for you? Coumadin was prescribed for you because you have a blood clot or a medical condition that can cause an increased risk of forming blood clots. Blood clots can cause serious health problems by blocking the flow of blood to the heart, lung, or brain. Coumadin can prevent harmful blood clots from forming. As a reminder your indication for Coumadin is:   Stroke Prevention Because Of Atrial  Fibrillation  What test will check on my response to Coumadin? While on Coumadin (warfarin) you will need to have an INR test regularly to ensure that your dose is keeping you in the desired range. The INR (international normalized ratio) number is calculated from the result of the laboratory test called prothrombin time (PT).  If an INR APPOINTMENT HAS NOT ALREADY BEEN MADE FOR YOU please schedule an appointment to have this lab work done by your health care provider within 7 days. Your INR goal is usually a number between:  2 to 3 or your provider may give you a more narrow range like 2-2.5.  Ask your health care provider during an office visit what your goal INR is.  What  do you need to  know  About  COUMADIN? Take Coumadin (warfarin) exactly as prescribed by your healthcare provider about the same time each day.  DO NOT stop taking without talking to the doctor who prescribed the medication.  Stopping without other blood clot prevention medication to take the place of Coumadin may increase your risk of developing a new clot or stroke.  Get refills before you run out.  What do you do if you miss a dose? If you miss a dose, take it as soon as you remember on the same day then continue your regularly scheduled regimen the next day.  Do not take two doses of Coumadin at the same time.  Important Safety Information A possible side effect of  Coumadin (Warfarin) is an increased risk of bleeding. You should call your healthcare provider right away if you experience any of the following: ? Bleeding from an injury or your nose that does not stop. ? Unusual colored urine (red or dark brown) or unusual colored stools (red or black). ? Unusual bruising for unknown reasons. ? A serious fall or if you hit your head (even if there is no bleeding).  Some foods or medicines interact with Coumadin (warfarin) and might alter your response to warfarin. To help avoid this: ? Eat a balanced diet, maintaining a  consistent amount of Vitamin K. ? Notify your provider about major diet changes you plan to make. ? Avoid alcohol or limit your intake to 1 drink for women and 2 drinks for men per day. (1 drink is 5 oz. wine, 12 oz. beer, or 1.5 oz. liquor.)  Make sure that ANY health care provider who prescribes medication for you knows that you are taking Coumadin (warfarin).  Also make sure the healthcare provider who is monitoring your Coumadin knows when you have started a new medication including herbals and non-prescription products.  Coumadin (Warfarin)  Major Drug Interactions  Increased Warfarin Effect Decreased Warfarin Effect  Alcohol (large quantities) Antibiotics (esp. Septra/Bactrim, Flagyl, Cipro) Amiodarone (Cordarone) Aspirin (ASA) Cimetidine (Tagamet) Megestrol (Megace) NSAIDs (ibuprofen, naproxen, etc.) Piroxicam (Feldene) Propafenone (Rythmol SR) Propranolol (Inderal) Isoniazid (INH) Posaconazole (Noxafil) Barbiturates (Phenobarbital) Carbamazepine (Tegretol) Chlordiazepoxide (Librium) Cholestyramine (Questran) Griseofulvin Oral Contraceptives Rifampin Sucralfate (Carafate) Vitamin K   Coumadin (Warfarin) Major Herbal Interactions  Increased Warfarin Effect Decreased Warfarin Effect  Garlic Ginseng Ginkgo biloba Coenzyme Q10 Green tea St. Johns wort    Coumadin (Warfarin) FOOD Interactions  Eat a consistent number of servings per week of foods HIGH in Vitamin K (1 serving =  cup)  Collards (cooked, or boiled & drained) Kale (cooked, or boiled & drained) Mustard greens (cooked, or boiled & drained) Parsley *serving size only =  cup Spinach (cooked, or boiled & drained) Swiss chard (cooked, or boiled & drained) Turnip greens (cooked, or boiled & drained)  Eat a consistent number of servings per week of foods MEDIUM-HIGH in Vitamin K (1 serving = 1 cup)  Asparagus (cooked, or boiled & drained) Broccoli (cooked, boiled & drained, or raw & chopped) Brussel  sprouts (cooked, or boiled & drained) *serving size only =  cup Lettuce, raw (green leaf, endive, romaine) Spinach, raw Turnip greens, raw & chopped   These websites have more information on Coumadin (warfarin):  FailFactory.se; VeganReport.com.au;

## 2018-06-13 NOTE — Progress Notes (Signed)
ANTICOAGULATION CONSULT NOTE - Follow Up Consult  Pharmacy Consult for Coumadin Indication: atrial fibrillation  No Known Allergies  Patient Measurements: Height: 5\' 5"  (165.1 cm) Weight: 201 lb 2.1 oz (91.2 kg) IBW/kg (Calculated) : 61.5  Vital Signs: Temp: 97.6 F (36.4 C) (03/24 0520) Temp Source: Oral (03/24 0520) BP: 155/64 (03/24 0520) Pulse Rate: 70 (03/24 0520)  Labs: Recent Labs    06/12/18 0551 06/13/18 0456  HGB  --  11.4*  HCT  --  35.7*  PLT  --  338  LABPROT 27.7*  --   INR 2.6*  --     Estimated Creatinine Clearance: 57.4 mL/min (by C-G formula based on SCr of 1.03 mg/dL).  Assessment:  [name] on Coumadin 7.5mg  daily  except 11.25 MWF for Afib; switched to 11.25mg  on TTS inpatient so far. INR continues to be therapeutic at 2.6 on 3/23. Hgb 11.4, plts wnl. Carbamazepine is probably the cause for the higher dose requirement.   Goal of Therapy:  INR 2-3 Monitor platelets by anticoagulation protocol: Yes   Plan:  Continue Coumadin 7.5mg  qday except for 11.25mg  TTS Monitor INR three times weekly, CBC, s/s of bleed  Elenor Quinones, PharmD, BCPS, BCIDP Clinical Pharmacist 06/13/2018 7:58 AM

## 2018-06-13 NOTE — Progress Notes (Signed)
Occupational Therapy Session Note  Patient Details  Name: [name] MRN: [id number] Date of Birth: 11/03/1933  Today's Date: 06/13/2018 OT Individual Time: 1345-1426 OT Individual Time Calculation (min): 41 min    Short Term Goals: Week 2:  OT Short Term Goal 1 (Week 2): Stg=LTG d/t ELOS  Skilled Therapeutic Interventions/Progress Updates:    Pt received sitting in w/c asleep easily awoken and requesting shower. Pt completed functional mobility with RW into bathroom with close (S). Pt requried cueing for seated LB dressing. Pt doffed all clothing close (S). Pt transferred into walk in shower and completed several sit <> stand transfers with use of grab bars and close (S). Pt able to initiate and appropriately terminate all bathing. Discussed d/c planning and need for any DME. Pt transferred out of shower and dried off, reaching distally with CGA provided for balance support. Pt sat EOB and donned all UB/LB clothing with (S). Pt demonstrated improved RW management this session. Pt stood at sink and completed oral hygiene with close (S). Pt was left supine with all needs met, bed alarm set.   Therapy Documentation Precautions:  Precautions Precautions: Fall Restrictions Weight Bearing Restrictions: No Pain:  No pain reported   Therapy/Group: Individual Therapy  Sandra H Davis 06/13/2018, 7:13 AM 

## 2018-06-13 NOTE — Progress Notes (Signed)
Speech Language Pathology Daily Session Note  Patient Details  Name: [name] MRN: [id number] Date of Birth: [birth year]  Today's Date: 06/13/2018 SLP Individual Time: 1132-1230 SLP Individual Time Calculation (min): 58 min  Short Term Goals: Week 2: SLP Short Term Goal 1 (Week 2): Patient will recall new, daily information with Min A multimodal cues.  SLP Short Term Goal 2 (Week 2): Patient will demonstrate functional problem solving for mildly complex tasks with Min A verbal cues.   Skilled Therapeutic Interventions: Skilled treatment session focused on cognitive goals. SLP facilitated session by providing overall Mod A verbal cues for recall with use of memory compensatory strategies during a novel memory task. Within the task, patient required extra time and Min A verbal cues for generative naming when given specific categories. Patient left upright in wheelchair with alarm on and all needs within reach. Continue with current plan of care.      Pain No/Denies Pain   Therapy/Group: Individual Therapy  PAYNE, COURTNEY 06/13/2018, 1:18 PM

## 2018-06-14 ENCOUNTER — Telehealth: Payer: Self-pay | Admitting: Family Medicine

## 2018-06-14 ENCOUNTER — Inpatient Hospital Stay (HOSPITAL_COMMUNITY): Payer: Medicare Other

## 2018-06-14 ENCOUNTER — Inpatient Hospital Stay (HOSPITAL_COMMUNITY): Payer: Medicare Other | Admitting: Speech Pathology

## 2018-06-14 ENCOUNTER — Inpatient Hospital Stay (HOSPITAL_COMMUNITY): Payer: Medicare Other | Admitting: Physical Therapy

## 2018-06-14 LAB — GLUCOSE, CAPILLARY
Glucose-Capillary: 98 mg/dL (ref 70–99)
Glucose-Capillary: 114 mg/dL — ABNORMAL HIGH (ref 70–99)
Glucose-Capillary: 113 mg/dL — ABNORMAL HIGH (ref 70–99)
Glucose-Capillary: 95 mg/dL (ref 70–99)

## 2018-06-14 LAB — BASIC METABOLIC PANEL
Anion gap: 7 (ref 5–15)
BUN: 25 mg/dL — ABNORMAL HIGH (ref 8–23)
CO2: 28 mmol/L (ref 22–32)
Calcium: 9.2 mg/dL (ref 8.9–10.3)
Chloride: 101 mmol/L (ref 98–111)
Creatinine, Ser: 1.11 mg/dL (ref 0.61–1.24)
GFR calc Af Amer: >60 mL/min (ref >60)
GFR calc non Af Amer: >60 mL/min (ref >60)
Glucose, Bld: 110 mg/dL — ABNORMAL HIGH (ref 70–99)
Potassium: 4.2 mmol/L (ref 3.5–5.1)
Sodium: 136 mmol/L (ref 135–145)

## 2018-06-14 LAB — PROTIME-INR
INR: 2.6 — ABNORMAL HIGH (ref 0.8–1.2)
Prothrombin Time: 27.3 seconds — ABNORMAL HIGH (ref 11.4–15.2)

## 2018-06-14 MED ORDER — CLONIDINE HCL 0.2 MG PO TABS
0.2000 mg | ORAL_TABLET | Freq: Two times a day (BID) | ORAL | Status: DC
  Administered 2018-06-14 – 2018-06-15 (×2): 0.2 mg via ORAL
  Filled 2018-06-14 (×2): qty 1

## 2018-06-14 NOTE — Progress Notes (Signed)
Physical Therapy Discharge Summary  Patient Details  Name: [name] MRN: [id number] Date of Birth: [birth year]  Today's Date: 06/14/2018 PT Individual Time: 0808-0920 PT Individual Time Calculation (min): 72 min    Patient has met 7 of 7 long term goals due to improved activity tolerance, improved balance, improved postural control, increased strength, ability to compensate for deficits, functional use of  right upper extremity and right lower extremity, improved awareness and improved coordination.  Patient to discharge at an ambulatory level supervision with RW.   Patient's care partner unable to participate in hands on training 2/2 hospital wide visitor restrictions however this therapist contacted pt's son via telephone & reviewed pt's CLOF & needs upon d/c with son reporting understanding.  Reasons goals not met: n/a  Recommendation:  Patient will benefit from ongoing skilled PT services in home health setting to continue to advance safe functional mobility, address ongoing impairments in balance, endurance, memory, safety awareness, gait & stair negotiation, and minimize fall risk.  Equipment: none - pt reports he has RW  Reasons for discharge: treatment goals met and discharge from hospital  Patient/family agrees with progress made and goals achieved: Yes  Skilled PT Treatment: Pt received in bed & agreeable to tx. Pt transfers to EOB with hospital bed features & supervision, sit>stand x 2 attempts 2/2 posterior LOB on first attempt, with supervision. Pt ambulates into bathroom with RW and CGA fade to supervision with cuing and improvement in decreasing his gait speed & focusing on safety. Pt with continent BM& void on toilet, performing peri hygiene without assistance. Pt performed hand hygiene at sink before reporting need to void again & did so with supervision for toilet transfer. Pt dons pants with supervision. Pt ambulates around unit with RW & supervision with occasional  cuing to ambulate within base of RW as pt pushes it too far out in front. Pt completes car transfer at SUV simulated height with cuing to sit then place BLE in/out of car with supervision overall. Pt negotiates ramp & mulch with RW and supervision. Pt negotiates 24 steps (6" + 3") with 1 rail and supervision. Pt utilized nu-step on level 6 x 5 minutes with all four extremities with task focusing on global strengthening. Contacted pt's son Legrand Como) via speaker phone & reviewed pt's supervision CLOF with RW, caregiver positioning in relation to pt when negotiating stairs & ambulating, as well as cuing pt needs for safety and pt's ability to complete floor transfer; pt's son verbalized understanding. At end of session pt left sitting in w/c in room with alarm donned & needs at hand.  Pain: Pt reports 3-4/10 pain in L knee - RN made aware & meds requested, rest breaks provided PRN, and L knee ace wrapped per pt's request with pt reporting "that feels better".  PT Discharge Precautions/Restrictions Precautions Precautions: Fall Restrictions Weight Bearing Restrictions: No  Vision/Perception  Pt wears glasses at all times at baseline. No changes in baseline vision. Perception WNL.  Cognition Overall Cognitive Status: History of cognitive impairments - at baseline Arousal/Alertness: Awake/alert Orientation Level: Oriented X4 Memory: Impaired Memory Impairment: Retrieval deficit;Decreased recall of new information;Decreased short term memory Safety/Judgment: Impaired  Sensation Sensation Light Touch: Impaired Detail Light Touch Impaired Details: Impaired RLE;Impaired RUE Proprioception: Impaired Detail Proprioception Impaired Details: Impaired RLE;Impaired RUE Coordination Fine Motor Movements are Fluid and Coordinated: No Coordination and Movement Description: residual Rt hemiplegia from previous CVA  Motor  Motor Motor: Abnormal postural alignment and control Motor - Discharge  Observations:  R hemiparesis from CVA PTA, generalized deconditioning   Mobility Bed Mobility Bed Mobility: Rolling Right;Rolling Left;Supine to Sit;Sit to Supine Rolling Right: Supervision/verbal cueing Rolling Left: Supervision/Verbal cueing Supine to Sit: Supervision/Verbal cueing Sit to Supine: Supervision/Verbal cueing Transfers Transfers: Sit to Stand;Stand to Sit Sit to Stand: Supervision/Verbal cueing Stand to Sit: Supervision/Verbal cueing  Locomotion  Gait Ambulation: Yes Gait Assistance: Supervision/Verbal cueing Gait Distance (Feet): 150 Feet Assistive device: Rolling walker Gait Assistance Details: (cuing to ambulate within base of RW) Gait Gait: Yes Gait Pattern: (forward trunk flexion, decreased stride length, decreased step length BLE) Stairs / Additional Locomotion Stairs: Yes Stairs Assistance: Supervision/Verbal cueing Stair Management Technique: (1 rail) Number of Stairs: 24 Height of Stairs: (6" + 3") Wheelchair Mobility Wheelchair Mobility: No Ramp: supervision, ambulatory with RW   Trunk/Postural Assessment  Delayed protective responses & righting reactions.  Balance Balance Balance Assessed: Yes Dynamic Standing Balance Dynamic Standing - Balance Support: During functional activity;Bilateral upper extremity supported Dynamic Standing - Level of Assistance: 5: Stand by assistance  Berg Balance Test = 30/56 on 06/09/18  Extremity Assessment  Extremities WFL for tasks assessed. Pt with residual hemiparesis in RUE/RLE from CVA years ago.   [name] 06/14/2018, 9:23 AM

## 2018-06-14 NOTE — Progress Notes (Signed)
Occupational Therapy Session Note  Patient Details  Name: [name] MRN: [id number] Date of Birth: Jul 22, 1933  Today's Date: 06/14/2018 OT Individual Time: 1100-1130 OT Individual Time Calculation (min): 30 min    Short Term Goals: Week 2:  OT Short Term Goal 1 (Week 2): Stg=LTG d/t ELOS  Skilled Therapeutic Interventions/Progress Updates:    Pt received sitting up in w/c with no c/o pain. Extensive discussion re d/c planning with pt. Pt completed 200 ft of functional mobility with RW to tub room. Pt practiced transfer using TTB with demo provided, (S) overall with cueing for RW management. Pt then sat EOM and completed seated and standing level functional reaching to floor to simulate item retrieval. CGA provided throughout. Pt returned to room and was left sitting up with all needs met, chair alarm belt fastened.   Therapy Documentation Precautions:  Precautions Precautions: Fall Restrictions Weight Bearing Restrictions: No Pain:  No pain reported.    Therapy/Group: Individual Therapy  Curtis Sites 06/14/2018, 7:16 AM

## 2018-06-14 NOTE — Progress Notes (Signed)
[facility] PHYSICAL MEDICINE & REHABILITATION PROGRESS NOTE   Subjective/Complaints: Overall feeling ok. Anxious to get home.  ROS: Patient denies fever, rash, sore throat, blurred vision, nausea, vomiting, diarrhea, cough, shortness of breath or chest pain, joint or back pain, headache, or mood change.    Objective:   No results found. Recent Labs    06/13/18 0456  WBC 12.8*  HGB 11.4*  HCT 35.7*  PLT 338   Recent Labs    06/14/18 0554  NA 136  K 4.2  CL 101  CO2 28  GLUCOSE 110*  BUN 25*  CREATININE 1.11  CALCIUM 9.2    Intake/Output Summary (Last 24 hours) at 06/14/2018 0955 Last data filed at 06/14/2018 0818 Gross per 24 hour  Intake 1060 ml  Output -  Net 1060 ml     Physical Exam: Vital Signs Blood pressure (!) 150/72, pulse 67, temperature 98.3 F (36.8 C), temperature source Oral, resp. rate 18, height 5\' 5"  (1.651 m), weight 93.2 kg, SpO2 98 %. Constitutional: No distress . Vital signs reviewed. HEENT: EOMI, oral membranes moist Neck: supple Cardiovascular: RRR without murmur. No JVD    Respiratory: CTA Bilaterally without wheezes or rales. Normal effort    GI: BS +, non-tender, non-distended  Musculoskeletal:  Comments:  lipoma right medial biceps region. Neurological: alert, normal language Improving insight. Oriented to date, place, reason he's here.remembers my name.  CN intact Motor is 5/5 in bilateral deltoid, bicep, tricep, grip, hip flexor, knee extensors, ankle dorsiflexor and plantar flexor. Sensory exam normal. Neuro-exam stable Psych: pleasant Skin: intact, bruising decreased   Assessment/Plan: 1. Functional deficits secondary to NPH which require 3+ hours per day of interdisciplinary therapy in a comprehensive inpatient rehab setting.  Physiatrist is providing close team supervision and 24 hour management of active medical problems listed below.  Physiatrist and rehab team continue to assess barriers to discharge/monitor  patient progress toward functional and medical goals  Care Tool:  Bathing  Bathing activity did not occur: Safety/medical concerns Body parts bathed by patient: Right arm, Left arm, Chest, Abdomen, Front perineal area, Right upper leg, Left upper leg, Right lower leg, Left lower leg, Face, Buttocks         Bathing assist Assist Level: Supervision/Verbal cueing     Upper Body Dressing/Undressing Upper body dressing   What is the patient wearing?: Pull over shirt    Upper body assist Assist Level: Supervision/Verbal cueing    Lower Body Dressing/Undressing Lower body dressing      What is the patient wearing?: Incontinence brief, Pants     Lower body assist Assist for lower body dressing: Supervision/Verbal cueing     Toileting Toileting    Toileting assist Assist for toileting: Contact Guard/Touching assist     Transfers Chair/bed transfer  Transfers assist  Chair/bed transfer activity did not occur: Safety/medical concerns  Chair/bed transfer assist level: Supervision/Verbal cueing     Locomotion Ambulation   Ambulation assist      Assist level: Supervision/Verbal cueing Assistive device: Walker-rolling Max distance: 154ft   Walk 10 feet activity   Assist     Assist level: Supervision/Verbal cueing Assistive device: Walker-rolling   Walk 50 feet activity   Assist Walk 50 feet with 2 turns activity did not occur: Safety/medical concerns  Assist level: Supervision/Verbal cueing Assistive device: Walker-rolling    Walk 150 feet activity   Assist Walk 150 feet activity did not occur: Safety/medical concerns  Assist level: Supervision/Verbal cueing Assistive device: Walker-rolling  Walk 10 feet on uneven surface  activity   Assist Walk 10 feet on uneven surfaces activity did not occur: Safety/medical concerns   Assist level: Supervision/Verbal cueing Assistive device: Aeronautical engineer Will patient  use wheelchair at discharge?: No Type of Wheelchair: Manual Wheelchair activity did not occur: N/A(pt to d/c at household ambulation level)  Wheelchair assist level: Supervision/Verbal cueing Max wheelchair distance: 50    Wheelchair 50 feet with 2 turns activity    Assist    Wheelchair 50 feet with 2 turns activity did not occur: N/A   Assist Level: Supervision/Verbal cueing   Wheelchair 150 feet activity     Assist Wheelchair 150 feet activity did not occur: N/A        Medical Problem List and Plan: 1.   Gait disorder and mental status changes secondary to normal pressure hydrocephalus  --Continue CIR therapies including PT, OT, and SLP   -ELOS 3/26  -Patient to see Rehab MD/provider in the office for transitional care encounter in 2-4 weeks.    2. Antithrombotics: -DVT/anticoagulation:Pharmaceutical:Coumadin INR 2.4 -antiplatelet therapy: N/A 3. Pain Management:tylenol prn. 4. Mood:LCSW to follow for evaluation and support. -antipsychotic agents: N/A 5. Neuropsych: This patientis notyet capable of making decisions on hisown behalf. 6. Skin/Wound Care:Routine pressure relief measures. 7. Fluids/Electrolytes/Nutrition: encourage PO  I personally reviewed the patient's labs today.    -BUN sl elevated---encourage Po fluids 8. H/o CVA/NPH: On coumadin.  9. Paroxymal Afib: Monitor HR bid. Metoprolol resumed today. Continue coumadin.  -HR controlled 3/17 10. HTN:   On metoprolol and Imdur.  -continue losartan 50mg  bid   Continue to hold Lasix  -on catapres 0.2mg  bid at home. Resumed at 0.1mg  bid 3/20.   -bp's still borderline---increase back to home dose of catapres 11. H/O seizures: On tegretol BID.Repeat levels 9.2. 12. T2DM: Monitor BS ac/hs--continue to use SSi for elevated BS. Blood sugars poorly controlled--   -improving control  -continue metformin 250mg  bid 13. CAD: On Lipitor, Imdur and metoprolol.  11.  Leukocytosis: wbc's decreased to 11.9 3/18  -ucx + E coli 40K  - keflex covg for 5 days total (completed)   12. Hyponatremia: Chronic? Off Lexapro now.    -improved to 136 3/14----136 3/25    LOS: 13 days A FACE TO [place] 06/14/2018, 9:55 AM

## 2018-06-14 NOTE — Telephone Encounter (Signed)
Pt is being discharged from hospital and will need a coumadin check they would rather him not come in unless he has to they want to know if they can have home health draw and send [place] results and adjust that way? Please call [facility] back at provided #.

## 2018-06-14 NOTE — Progress Notes (Signed)
Speech Language Pathology Discharge Summary  Patient Details  Name: [name] MRN: [id number] Date of Birth: [birth year]  Today's Date: 06/14/2018 SLP Individual Time: 1000-1055 SLP Individual Time Calculation (min): 55 min   Skilled Therapeutic Interventions:   Skilled treatment session focused on cognitive goals. SLP facilitated session by re-administering the Cognistat Patient scored within functional limits for all subtests with the exception of memory in which he scored within the range of a mild impairment. However, patient independently utilized his external aids this session to recall events from previous therapy session. SLP also facilitated session by calling the patient's daughter and providing education in regards to patient's current memory impairments and strategies to utilize to maximize safety, recall and carryover of functional information. She verbalized understanding. Patient left upright in his wheelchair with the alarm on and all needs within reach. Continue with current plan of care.   Patient has met 4 of 4 long term goals.  Patient to discharge at overall Min;Supervision level.   Reasons goals not met: N/A    Clinical Impression/Discharge Summary: Patient has made excellent gains and has met 4 of 4 LTGs this admission. Currently, patient requires overall supervision for complex problem solving and Min A verbal and visual cues for recall of functional and daily information. Patient and family education is complete and patient will discharge home with 24 hour supervision from family. Patient would benefit from f/u SLP services to maximize his cognitive function and overall functional independence prior to discharge.    Care Partner:  Caregiver Able to Provide Assistance: Yes  Type of Caregiver Assistance: Cognitive;Physical  Recommendation:  24 hour supervision/assistance;Home Health SLP  Rationale for SLP Follow Up: Reduce caregiver burden;Maximize cognitive  function and independence   Equipment: N/A    Reasons for discharge: Treatment goals met;Discharged from hospital   Patient/Family Agrees with Progress Made and Goals Achieved: Yes    [place], [place] 06/14/2018, 1:34 PM

## 2018-06-14 NOTE — Progress Notes (Signed)
ANTICOAGULATION CONSULT NOTE - Follow Up Consult  Pharmacy Consult for Coumadin Indication: atrial fibrillation  No Known Allergies  Patient Measurements: Height: 5\' 5"  (165.1 cm) Weight: 205 lb 7.9 oz (93.2 kg) IBW/kg (Calculated) : 61.5  Vital Signs: Temp: 98.3 F (36.8 C) (03/25 0606) Temp Source: Oral (03/25 0606) BP: 150/72 (03/25 0606) Pulse Rate: 67 (03/25 0606)  Labs: Recent Labs    06/12/18 0551 06/13/18 0456 06/14/18 0554  HGB  --  11.4*  --   HCT  --  35.7*  --   PLT  --  338  --   LABPROT 27.7*  --  27.3*  INR 2.6*  --  2.6*  CREATININE  --   --  1.11    Estimated Creatinine Clearance: 53.8 mL/min (by C-G formula based on SCr of 1.11 mg/dL).  Assessment:  [name] on Coumadin 7.5mg  daily  except 11.25 MWF for Afib; switched to 11.25mg  on TTS inpatient so far. INR continues to be therapeutic at 2.6 on 3/23. Hgb 11.4, plts wnl. Carbamazepine is probably the cause for the higher dose requirement.   INR conts to be therapeutic. Cont home dose  Goal of Therapy:  INR 2-3 Monitor platelets by anticoagulation protocol: Yes   Plan:  Continue Coumadin 7.5mg  qday except for 11.25mg  TTS Monitor INR three times weekly, CBC, s/s of bleed  Onnie Boer, PharmD, BCIDP, AAHIVP, CPP Infectious Disease Pharmacist 06/14/2018 9:07 AM

## 2018-06-14 NOTE — Progress Notes (Signed)
Social Work Patient ID: [name], male   DOB: 01/02/1934, 85 y.o.   MRN: [id number]  Have reviewed team conf with pt and daughter, Maudie Mercury.  All feeling ready for d/c tomorrow.  Discussed HH follow up - referred to [facility].  Son to pick up in the morning.  HOYLE, LUCY, LCSW

## 2018-06-14 NOTE — Patient Care Conference (Signed)
Inpatient RehabilitationTeam Conference and Plan of Care Update Date: 06/13/2018   Time: 2:35 PM    Patient Name: [name]      Medical Record Number: [id number]  Date of Birth: 1933-11-26 Sex: Male         Room/Bed: 4W16C/4W16C-01 Payor Info: Payor: MEDICARE / Plan: MEDICARE PART A AND B / Product Type: *No Product type* /    Admitting Diagnosis: old cvs nph  Admit Date/Time:  06/01/2018  3:04 PM Admission Comments: No comment available   Primary Diagnosis:  <principal problem not specified> Principal Problem: <principal problem not specified>  Patient Active Problem List   Diagnosis Date Noted  . Normal pressure hydrocephalus ([place]) 06/01/2018  . Altered mental status 05/31/2018  . AMS (altered mental status) 05/30/2018  . Seizure disorder ([place]) 05/30/2018  . Fracture of proximal phalanx of finger fifth 06/17/2017 07/21/2017  . Upper airway cough syndrome 11/01/2016  . Atrial flutter ([place]) 08/05/2016  . Coronary artery disease involving native coronary artery of native heart without angina pectoris 08/05/2016  . Chronic anticoagulation 12/31/2015  . History of seizures 12/31/2015  . Acute diastolic CHF (congestive heart failure) ([place]) 12/24/2015  . Pulmonary edema   . Paroxysmal atrial fibrillation ([facility])   . Hypothyroidism   . Controlled type 2 diabetes mellitus with complication, without long-term current use of insulin ([place])   . Hypertensive emergency 12/20/2015  . Special screening for malignant neoplasms, colon 12/07/2013  . Bowel habit changes 12/07/2013  . Diarrhea 03/09/2013  . History of cardioembolic cerebrovascular accident (CVA) 01/31/2013  . Chest pain 01/29/2013  . Bilateral carotid artery disease ([place])   . Loss of coordination 12/03/2012  . Morbid obesity due to excess calories ([place]) 08/20/2011  . CAD S/P percutaneous coronary angioplasty   . Dyslipidemia   . Hypertension     Expected Discharge Date: Expected Discharge Date: 06/15/18  Team Members  Present: Physician leading conference: Dr. Alger Simons Social Worker Present: Lennart Pall, LCSW Nurse Present: Ellison Carwin, LPN PT Present: Lavone Nian, PT OT Present: Other (comment)(Sandra Rosana Hoes, OT) SLP Present: Weston Anna, SLP PPS Coordinator present : Gunnar Fusi     Current Status/Progress Goal Weekly Team Focus  Medical   improving cognition and behavior. HTN responding to adjustments. minimal pain  stabilize medically for discharge  bp control, cognition   Bowel/Bladder   Continent of bowel/bladder with occasional incontinent episode at night; LBM 06/11/18  remain continent of bowel/bladder with Mod I  Assist with toileting as needed   Swallow/Nutrition/ Hydration             ADL's   CGA LB dressing, (S) UB dressing, CGA- (S) dynamic standing balance   (S) ADLs, min A cognitive goals  RW management, dynamic standing balance, functional activity tolerance, shower transfers   Mobility   supervision overall with RW  supervision overall with LRAD  d/c planning, stair negotiation, balance, gait, floor transfer, endurance, strengthening   Communication             Safety/Cognition/ Behavioral Observations  Min-Mod A  Min A   Family Education    Pain   No complain of pain  <2  Assess and treat pain q shift and as needed   Skin   large lipoma to the right arm  Maintain skin integrity with Mod I  Assess skin q shift and as needed    Rehab Goals Patient on target to meet rehab goals: Yes *See Care Plan and progress notes for long and  short-term goals.     Barriers to Discharge  Current Status/Progress Possible Resolutions Date Resolved   Physician    Medical stability        supervision of family      Nursing                  PT  Decreased caregiver support  unsure if pt's wife/family can provide supervision at d/c              OT                  SLP                SW                Discharge Planning/Teaching Needs:  Home with family to  provide 24/7 supervision/ assist.  Family ed being completed tomorrow   Team Discussion:  Making good gains.  Cont b/b and decreased leg pain? Supervision level but still with some decreased balance.  Still with mild memory deficits.  Ready for d/c Thursday  Revisions to Treatment Plan:  NA    Continued Need for Acute Rehabilitation Level of Care: The patient requires daily medical management by a physician with specialized training in physical medicine and rehabilitation for the following conditions: Daily direction of a multidisciplinary physical rehabilitation program to ensure safe treatment while eliciting the highest outcome that is of practical value to the patient.: Yes Daily medical management of patient stability for increased activity during participation in an intensive rehabilitation regime.: Yes Daily analysis of laboratory values and/or radiology reports with any subsequent need for medication adjustment of medical intervention for : Blood pressure problems;Neurological problems   I attest that I was present, lead the team conference, and concur with the assessment and plan of the team.   ,  06/14/2018, 1:31 PM

## 2018-06-14 NOTE — Progress Notes (Signed)
Occupational Therapy Discharge Summary  Patient Details  Name: CHEVY SWEIGERT MRN: [id number] Date of Birth: August 29, 1933  Today's Date: 06/14/2018 OT Individual Time: 1230-1330 OT Individual Time Calculation (min): 60 min    Patient has met 13 of 13 long term goals due to improved activity tolerance, improved balance, postural control, ability to compensate for deficits, functional use of  RIGHT upper extremity, improved attention, improved awareness and improved coordination.  Patient to discharge at overall Supervision level.  Patient's care partner is independent to provide the necessary physical and cognitive assistance at discharge.    Reasons goals not met: All treatment goals met.   Recommendation:  Patient will benefit from ongoing skilled OT services in home health setting to continue to advance functional skills in the area of BADL.  Equipment: RW  Reasons for discharge: treatment goals met and discharge from hospital  Patient/family agrees with progress made and goals achieved: Yes   Skilled OT Intervention:  Discussed OT POC and goals met with pt. Pt pleased with progress. PT and SLP have spoken with pt's family via phone and completed family edu. Pt completed ADLs this session at goal level- (S) overall. Pt still requires intermittent cueing for safety awareness during dressing. Pt completed item retrieval to pack his clothes around room with intermittent cueing for RW management, (S) overall. Pt was left sitting up with all needs met, chair alarm set.   OT Discharge Precautions/Restrictions  Precautions Precautions: Fall Restrictions Weight Bearing Restrictions: No Pain Pain Assessment Pain Scale: 0-10 Pain Score: 0-No pain ADL ADL Eating: Modified independent Where Assessed-Eating: Edge of bed Grooming: Supervision/safety Where Assessed-Grooming: Standing at sink Upper Body Bathing: Supervision/safety Where Assessed-Upper Body Bathing: Wheelchair, Sitting at  sink Lower Body Bathing: Supervision/safety Where Assessed-Lower Body Bathing: Sitting at sink, Standing at sink, Wheelchair Upper Body Dressing: Supervision/safety Where Assessed-Upper Body Dressing: Sitting at sink, Wheelchair Lower Body Dressing: Supervision/safety Where Assessed-Lower Body Dressing: Sitting at sink, Standing at sink, Wheelchair Where Assessed-Toileting: Glass blower/designer: Close supervision Toilet Transfer Method: Counselling psychologist: Ambulance person Transfer: Close supervison Clinical cytogeneticist Method: Magazine features editor: Close supervision Social research officer, government Method: Heritage manager: Radio broadcast assistant Vision Baseline Vision/History: Wears glasses Wears Glasses: At all times Patient Visual Report: No change from baseline(some change since last CVA but nothing further) Vision Assessment?: Yes Eye Alignment: Within Functional Limits Ocular Range of Motion: Within Functional Limits Tracking/Visual Pursuits: Able to track stimulus in all quads without difficulty Saccades: Within functional limits Visual Fields: No apparent deficits Perception  Perception: Within Functional Limits Praxis Praxis: Intact Cognition Overall Cognitive Status: History of cognitive impairments - at baseline Arousal/Alertness: Awake/alert Orientation Level: Oriented to person;Disoriented to time;Oriented to place;Oriented to situation Attention: Selective Selective Attention: Appears intact Memory: Impaired Memory Impairment: Retrieval deficit;Decreased recall of new information;Decreased short term memory Awareness: Appears intact Problem Solving Impairment: Functional complex Executive Function: Sequencing Sequencing: Impaired Sequencing Impairment: Verbal complex Safety/Judgment: Impaired Sensation Sensation Light Touch: Appears Intact Light Touch Impaired Details: Impaired RLE;Impaired RUE Proprioception:  Impaired Detail Proprioception Impaired Details: Impaired RLE;Impaired RUE Coordination Gross Motor Movements are Fluid and Coordinated: Yes Fine Motor Movements are Fluid and Coordinated: No Coordination and Movement Description: residual Rt hemiplegia from previous CVA Motor  Motor Motor: Abnormal postural alignment and control Motor - Discharge Observations: R hemiparesis from CVA PTA, generalized deconditioning Mobility  Bed Mobility Bed Mobility: Rolling Right;Rolling Left;Supine to Sit;Sit to Supine Rolling Right: Supervision/verbal cueing Rolling Left: Supervision/Verbal cueing Supine to  Sit: Supervision/Verbal cueing Sit to Supine: Supervision/Verbal cueing Transfers Sit to Stand: Supervision/Verbal cueing Stand to Sit: Supervision/Verbal cueing  Trunk/Postural Assessment  Cervical Assessment Cervical Assessment: Exceptions to WFL(forward head) Thoracic Assessment Thoracic Assessment: Exceptions to WFL(kyphotic) Lumbar Assessment Lumbar Assessment: Exceptions to WFL(posterior pelvic tilt) Postural Control Postural Control: Deficits on evaluation Righting Reactions: delayed  Balance Balance Balance Assessed: Yes Static Sitting Balance Static Sitting - Balance Support: Bilateral upper extremity supported;Feet supported Static Sitting - Level of Assistance: 6: Modified independent (Device/Increase time) Dynamic Sitting Balance Dynamic Sitting - Balance Support: Feet supported Dynamic Sitting - Level of Assistance: 6: Modified independent (Device/Increase time) Static Standing Balance Static Standing - Balance Support: During functional activity;Bilateral upper extremity supported Static Standing - Level of Assistance: 5: Stand by assistance Dynamic Standing Balance Dynamic Standing - Balance Support: During functional activity;Bilateral upper extremity supported Dynamic Standing - Level of Assistance: 5: Stand by assistance Extremity/Trunk Assessment RUE  Assessment RUE Assessment: Exceptions to [facility] General Strength Comments: 3+/5- generalized weakness and decreased coordination at baseline from CVA PTA LUE Assessment LUE Assessment: Within Functional Limits   Curtis Sites 06/14/2018, 11:12 AM

## 2018-06-14 NOTE — Telephone Encounter (Signed)
Called [name] and gave the ok to have [facility] check PT/INR

## 2018-06-15 LAB — GLUCOSE, CAPILLARY: Glucose-Capillary: 108 mg/dL — ABNORMAL HIGH (ref 70–99)

## 2018-06-15 MED ORDER — ESCITALOPRAM OXALATE 5 MG PO TABS: 5.0000 mg | ORAL_TABLET | Freq: Every day | ORAL | 1 refills | Status: DC

## 2018-06-15 MED ORDER — ESCITALOPRAM OXALATE 10 MG PO TABS: 10.0000 mg | ORAL_TABLET | Freq: Every day | ORAL | 0 refills | Status: DC

## 2018-06-15 MED ORDER — CARBAMAZEPINE ER 300 MG PO CP12: 300.0000 mg | ORAL_CAPSULE | Freq: Two times a day (BID) | ORAL | 0 refills | Status: DC

## 2018-06-15 MED ORDER — ATORVASTATIN CALCIUM 80 MG PO TABS: 80.0000 mg | ORAL_TABLET | Freq: Every day | ORAL | 0 refills | Status: DC

## 2018-06-15 MED ORDER — WARFARIN SODIUM 7.5 MG PO TABS: ORAL_TABLET | ORAL | 1 refills | Status: DC

## 2018-06-15 MED ORDER — METFORMIN HCL 500 MG PO TABS: 250.0000 mg | ORAL_TABLET | Freq: Two times a day (BID) | ORAL | 1 refills | Status: DC

## 2018-06-15 MED ORDER — METOPROLOL TARTRATE 100 MG PO TABS: 100.0000 mg | ORAL_TABLET | Freq: Two times a day (BID) | ORAL | 1 refills | Status: DC

## 2018-06-15 MED ORDER — LOSARTAN POTASSIUM 100 MG PO TABS: 50.0000 mg | ORAL_TABLET | Freq: Two times a day (BID) | ORAL | 1 refills | Status: DC

## 2018-06-15 MED ORDER — CLONIDINE HCL 0.2 MG PO TABS: 0.2000 mg | ORAL_TABLET | Freq: Two times a day (BID) | ORAL | 1 refills | Status: DC

## 2018-06-15 MED ORDER — ACETAMINOPHEN 325 MG PO TABS: 325.0000 mg | ORAL_TABLET | ORAL | Status: DC | PRN

## 2018-06-15 MED ORDER — ISOSORBIDE MONONITRATE ER 30 MG PO TB24: 30.0000 mg | ORAL_TABLET | Freq: Every day | ORAL | 1 refills | Status: DC

## 2018-06-15 NOTE — Progress Notes (Signed)
Social Work  Discharge Note  The overall goal for the admission was met for:   Discharge location: Yes - home with family to provide 24/7 supervision  Length of Stay: Yes - 13 days  Discharge activity level: Yes - supervision  Home/community participation: Yes  Services provided included: MD, RD, PT, OT, SLP, RN, TR, Pharmacy, Neuropsych and SW  Financial Services: Medicare and Private Insurance: [employer] [place]  Follow-up services arranged: Home Health: Therapist, sports, PT, OT, ST via [place] and Patient/Family has no preference for HH/DME agencies  Comments (or additional information):  Patient/Family verbalized understanding of follow-up arrangements: Yes  Individual responsible for coordination of the follow-up plan: pt/ daughter  Confirmed correct DME delivered: NA - had needed DME    HOYLE, LUCY

## 2018-06-15 NOTE — Progress Notes (Signed)
Patient discharged to home per wheelchair accompanied by NT and CN.Discharge instructions done by PA per phone.

## 2018-06-15 NOTE — Progress Notes (Signed)
Patient to be discharged today waiting for ride. Per CN discharge instructions will be done over the phone by PA and patient will be brought downstairs per wheelchair by NT. Patient is ready and all his things packed. Morning meds given and no further questions noted

## 2018-06-15 NOTE — Progress Notes (Signed)
[place] PHYSICAL MEDICINE & REHABILITATION PROGRESS NOTE   Subjective/Complaints: No new issues. Feels ready to go home!!  ROS: Patient denies fever, rash, sore throat, blurred vision, nausea, vomiting, diarrhea, cough, shortness of breath or chest pain, joint or back pain, headache, or mood change. .    Objective:   No results found. Recent Labs    06/13/18 0456  WBC 12.8*  HGB 11.4*  HCT 35.7*  PLT 338   Recent Labs    06/14/18 0554  NA 136  K 4.2  CL 101  CO2 28  GLUCOSE 110*  BUN 25*  CREATININE 1.11  CALCIUM 9.2    Intake/Output Summary (Last 24 hours) at 06/15/2018 0911 Last data filed at 06/15/2018 0745 Gross per 24 hour  Intake 699 ml  Output -  Net 699 ml     Physical Exam: Vital Signs Blood pressure (!) 158/70, pulse 78, temperature 98.3 F (36.8 C), temperature source Oral, resp. rate 18, height 5\' 5"  (1.651 m), weight 93.2 kg, SpO2 99 %. Constitutional: No distress . Vital signs reviewed. HEENT: EOMI, oral membranes moist Neck: supple Cardiovascular: RRR without murmur. No JVD    Respiratory: CTA Bilaterally without wheezes or rales. Normal effort    GI: BS +, non-tender, non-distended  Musculoskeletal:  Comments:  lipoma right medial biceps region. Neurological: alert, normal language Improved insight and awareness  CN intact Motor is 5/5 in bilateral deltoid, bicep, tricep, grip, hip flexor, knee extensors, ankle dorsiflexor and plantar flexor. Sensory exam normal. Neuro-exam stable Psych: pleasant Skin: intact, bruising decreased   Assessment/Plan: 1. Functional deficits secondary to NPH which require 3+ hours per day of interdisciplinary therapy in a comprehensive inpatient rehab setting.  Physiatrist is providing close team supervision and 24 hour management of active medical problems listed below.  Physiatrist and rehab team continue to assess barriers to discharge/monitor patient progress toward functional and medical  goals  Care Tool:  Bathing  Bathing activity did not occur: Safety/medical concerns Body parts bathed by patient: Right arm, Left arm, Chest, Abdomen, Front perineal area, Right upper leg, Left upper leg, Right lower leg, Left lower leg, Face, Buttocks         Bathing assist Assist Level: Supervision/Verbal cueing     Upper Body Dressing/Undressing Upper body dressing   What is the patient wearing?: Pull over shirt    Upper body assist Assist Level: Supervision/Verbal cueing    Lower Body Dressing/Undressing Lower body dressing      What is the patient wearing?: Incontinence brief, Pants     Lower body assist Assist for lower body dressing: Supervision/Verbal cueing     Toileting Toileting    Toileting assist Assist for toileting: Supervision/Verbal cueing     Transfers Chair/bed transfer  Transfers assist  Chair/bed transfer activity did not occur: Safety/medical concerns  Chair/bed transfer assist level: Supervision/Verbal cueing     Locomotion Ambulation   Ambulation assist      Assist level: Supervision/Verbal cueing Assistive device: Walker-rolling Max distance: 188ft   Walk 10 feet activity   Assist     Assist level: Supervision/Verbal cueing Assistive device: Walker-rolling   Walk 50 feet activity   Assist Walk 50 feet with 2 turns activity did not occur: Safety/medical concerns  Assist level: Supervision/Verbal cueing Assistive device: Walker-rolling    Walk 150 feet activity   Assist Walk 150 feet activity did not occur: Safety/medical concerns  Assist level: Supervision/Verbal cueing Assistive device: Walker-rolling    Walk 10 feet on uneven surface  activity   Assist Walk 10 feet on uneven surfaces activity did not occur: Safety/medical concerns   Assist level: Supervision/Verbal cueing Assistive device: Aeronautical engineer Will patient use wheelchair at discharge?: No Type of Wheelchair:  Manual Wheelchair activity did not occur: N/A(pt to d/c at household ambulation level)  Wheelchair assist level: Supervision/Verbal cueing Max wheelchair distance: 50    Wheelchair 50 feet with 2 turns activity    Assist    Wheelchair 50 feet with 2 turns activity did not occur: N/A   Assist Level: Supervision/Verbal cueing   Wheelchair 150 feet activity     Assist Wheelchair 150 feet activity did not occur: N/A        Medical Problem List and Plan: 1.   Gait disorder and mental status changes secondary to normal pressure hydrocephalus  -dc home today  -Patient to see Rehab MD/provider in the office for transitional care encounter in 2-4 weeks.    2. Antithrombotics: -DVT/anticoagulation:Pharmaceutical:Coumadin INR 2.4 -antiplatelet therapy: N/A 3. Pain Management:tylenol prn. 4. Mood:LCSW to follow for evaluation and support. -antipsychotic agents: N/A 5. Neuropsych: This patientis notyet capable of making decisions on hisown behalf. 6. Skin/Wound Care:Routine pressure relief measures. 7. Fluids/Electrolytes/Nutrition: encourage PO  I personally reviewed the patient's labs today.    -BUN sl elevated---encourage Po fluids 8. H/o CVA/NPH: On coumadin.  9. Paroxymal Afib: Monitor HR bid. Metoprolol resumed today. Continue coumadin.  -HR controlled 3/17 10. HTN:   On metoprolol and Imdur.  -continue losartan 50mg  bid   Continue to hold Lasix  -resumed catapres 0.2mg  bid   11. H/O seizures: On tegretol BID.Repeat levels 9.2. 12. T2DM: Monitor BS ac/hs--continue to use SSi for elevated BS. Blood sugars poorly controlled--   -improving control  -continue metformin 250mg  bid 13. CAD: On Lipitor, Imdur and metoprolol.  11. Leukocytosis: wbc's decreased to 11.9 3/18  -ucx + E coli 40K  - keflex covg for 5 days total (completed)   12. Hyponatremia: Chronic? Off Lexapro now.    -improved to 136 3/14----136 3/25     LOS: 14 days A [place] 06/15/2018, 9:11 AM

## 2018-06-15 NOTE — Discharge Summary (Addendum)
Physician Discharge Summary  Patient ID: [name] MRN: [id number] DOB/AGE: 1934/02/15 85 y.o.  Admit date: 06/01/2018 Discharge date: 06/15/2018  Discharge Diagnoses:  Principal Problem:   Normal pressure hydrocephalus (HCC) Active Problems:   HTN (hypertension)   Controlled type 2 diabetes mellitus with complication, without long-term current use of insulin (HCC)   Seizure disorder (HCC)   E. coli UTI   Leucocytosis   Discharged Condition: stable   Significant Diagnostic Studies: N/A   Labs:  Basic Metabolic Panel: BMP Latest Ref Rng & Units 06/14/2018 06/07/2018 06/02/2018  Glucose 70 - 99 mg/dL 110(H) 119(H) 127(H)  BUN 8 - 23 mg/dL 25(H) 15 19  Creatinine 0.61 - 1.24 mg/dL 1.11 1.03 1.18  Sodium 135 - 145 mmol/L 136 135 136  Potassium 3.5 - 5.1 mmol/L 4.2 4.0 4.1  Chloride 98 - 111 mmol/L 101 104 100  CO2 22 - 32 mmol/L 28 26 27   Calcium 8.9 - 10.3 mg/dL 9.2 8.4(L) 9.0    CBC: CBC Latest Ref Rng & Units 06/13/2018 06/07/2018 06/05/2018  WBC 4.0 - 10.5 K/uL 12.8(H) 11.9(H) 15.8(H)  Hemoglobin 13.0 - 17.0 g/dL 11.4(L) 12.1(L) 11.7(L)  Hematocrit 39.0 - 52.0 % 35.7(L) 36.6(L) 37.3(L)  Platelets 150 - 400 K/uL 338 375 300    CBG: Recent Labs  Lab 06/14/18 0616 06/14/18 1159 06/14/18 1644 06/14/18 2127 06/15/18 0622  GLUCAP 98 114* 113* 95 108*    Brief HPI:   [name] is an 85 year old male with history of CVA with residual R-HP/facial droop, seizures, T2DM, A flutter- on coumadin, CAD, recent bronchitis flare who was admitted via ED on 05/30/18 with syncope, mental status changes, abnormal chewing movements as well as eye fluttering. Family also reported issues with urinary incontinence, shuffling gait and cognitive decline in the past year.   He was treated with IVF but continued to have waxing and waning of mental status. EEG without epileptiform activity.  MRI brain revealed findings of mild chronic communicating hydrocephalus and neurology recommends  follow up at NPH clinic at [facility] for work up.    Hospital Course: [name] was admitted to rehab 06/01/2018 for inpatient therapies to consist of PT, ST and OT at least three hours five days a week. Past admission physiatrist, therapy team and rehab RN have worked together to provide customized collaborative inpatient rehab. He has been seizure free on tegretol bid and repeat levels were therapeutic.  He was found to have leucocytosis due to E coli UTI and was treated with Keflex X 5 days. Follow up CBC showed that leucocytosis is resolving. Serial check of lytes showed pre-renal azotemia and he has been encouraged to increase fluid intake.   Blood pressures have been monitored on bid basis and catapres was resumed for better control. Heart rate has been monitored on bid basis and is controlled on home dose metoprolol. Pharmacy has been assisting with dosing and management of coumadin. INR is therapeutic at discharge and he is to continue 11.25 mg on T,T,Sa and take 7.5 mg all other days. HHRN to draw protime on 3/30 and call results to primary MD. Patient with resultant cognitive deficits with confabulatory speech, word findings deficits, difficulty with processing and balance deficits affecting ADLs and mobility. CIR recommended due to functional decline.   Rehab course: During patient's stay in rehab weekly team conferences were held to monitor patient's progress, set goals and discuss barriers to discharge. At admission, patient required mod assist with basic self care tasks and  with mobility  He exhibited mild cognitive impairments in naming and moderate impairments in recall and orientation.  He has had improvement in activity tolerance, balance, postural control as well as ability to compensate for deficits. He is able to complete ADL tasks with supervision. He requires supervision for transfers and to ambulate 150' with RW.  He requires supervision for complex problem solving and min verbal cues  to recall daily and functional information. Family education completed regarding assistance and need for supervision after discharge.    Disposition:  Home  Diet: Heart healthy/Carb modified.   Special Instructions: 1. Needs 24 hours supervision as well as assistance with medication management.  2. HHRN to draw protime on 3/30 with results to primary MD.  3. Need to increase fluid intake.    Discharge Instructions    Ambulatory referral to Neurology   Complete by:  As directed    An appointment is requested in approximately: 2-4 weeks   Ambulatory referral to Physical Medicine Rehab   Complete by:  As directed      Allergies as of 06/15/2018   No Known Allergies     Medication List    STOP taking these medications   furosemide 20 MG tablet Commonly known as:  LASIX     TAKE these medications   acetaminophen 325 MG tablet Commonly known as:  TYLENOL Take 1-2 tablets (325-650 mg total) by mouth every 4 (four) hours as needed for mild pain.   albuterol 108 (90 Base) MCG/ACT inhaler Commonly known as:  PROVENTIL HFA;VENTOLIN HFA Inhale 2 puffs into the lungs every 4 (four) hours as needed for wheezing or shortness of breath.   atorvastatin 80 MG tablet Commonly known as:  LIPITOR Take 1 tablet (80 mg total) by mouth daily at 6 PM.   carbamazepine 300 MG 12 hr capsule Commonly known as:  CARBATROL Take 1 capsule (300 mg total) by mouth 2 (two) times daily.   cloNIDine 0.2 MG tablet Commonly known as:  CATAPRES Take 1 tablet (0.2 mg total) by mouth 2 (two) times daily.   escitalopram 5 MG tablet Commonly known as:  LEXAPRO Take 1 tablet (5 mg total) by mouth at bedtime. What changed:    medication strength  how much to take  when to take this   isosorbide mononitrate 30 MG 24 hr tablet Commonly known as:  IMDUR Take 1 tablet (30 mg total) by mouth daily.   levothyroxine 75 MCG tablet Commonly known as:  SYNTHROID, LEVOTHROID TAKE 1 TABLET BY MOUTH EVERY  DAY   losartan 100 MG tablet Commonly known as:  COZAAR Take 0.5 tablets (50 mg total) by mouth 2 (two) times daily. What changed:  See the new instructions.   metFORMIN 500 MG tablet Commonly known as:  GLUCOPHAGE Take 0.5 tablets (250 mg total) by mouth 2 (two) times daily with a meal. What changed:  See the new instructions.   metoprolol tartrate 100 MG tablet Commonly known as:  LOPRESSOR Take 1 tablet (100 mg total) by mouth 2 (two) times daily.   multivitamins ther. w/minerals Tabs tablet Take 1 tablet by mouth at bedtime.   warfarin 7.5 MG tablet Commonly known as:  COUMADIN Take as directed. If you are unsure how to take this medication, talk to your nurse or doctor. Original instructions:  TAKE 1 AND 1/2 TABLET BY MOUTH Tuesday, Thursday AND Saturday. TAKE ONE TABLET ON Sunday, Monday, Wednesday AND Friday.  TAKE WITH SUPPER. What changed:  additional instructions  Follow-up Information    Meredith Staggers, MD Follow up.   Specialty:  Physical Medicine and Rehabilitation Why:  Office will call you for follow up visit Contact information: [address] [place] [address] [phone]        [name], MD. Call.   Specialty:  Family Medicine Why:  for follow up appointment Contact information: [place] [address] [phone]        [facility]. Call.   Why:  for follow up appointment. (Has seen Dr. Leonie Man in the past after stroke) Contact information: [place] [phone]          Signed: Bary Leriche 06/19/2018, 10:42 PM

## 2018-06-16 DIAGNOSIS — I252 Old myocardial infarction: Secondary | ICD-10-CM | POA: Diagnosis not present

## 2018-06-16 DIAGNOSIS — Z87891 Personal history of nicotine dependence: Secondary | ICD-10-CM | POA: Diagnosis not present

## 2018-06-16 DIAGNOSIS — Z9861 Coronary angioplasty status: Secondary | ICD-10-CM | POA: Diagnosis not present

## 2018-06-16 DIAGNOSIS — I110 Hypertensive heart disease with heart failure: Secondary | ICD-10-CM | POA: Diagnosis not present

## 2018-06-16 DIAGNOSIS — E119 Type 2 diabetes mellitus without complications: Secondary | ICD-10-CM | POA: Diagnosis not present

## 2018-06-16 DIAGNOSIS — Z6830 Body mass index (BMI) 30.0-30.9, adult: Secondary | ICD-10-CM | POA: Diagnosis not present

## 2018-06-16 DIAGNOSIS — I4892 Unspecified atrial flutter: Secondary | ICD-10-CM | POA: Diagnosis not present

## 2018-06-16 DIAGNOSIS — H8109 Meniere's disease, unspecified ear: Secondary | ICD-10-CM | POA: Diagnosis not present

## 2018-06-16 DIAGNOSIS — G912 (Idiopathic) normal pressure hydrocephalus: Secondary | ICD-10-CM | POA: Diagnosis not present

## 2018-06-16 DIAGNOSIS — I480 Paroxysmal atrial fibrillation: Secondary | ICD-10-CM | POA: Diagnosis not present

## 2018-06-16 DIAGNOSIS — I5031 Acute diastolic (congestive) heart failure: Secondary | ICD-10-CM | POA: Diagnosis not present

## 2018-06-16 DIAGNOSIS — I69398 Other sequelae of cerebral infarction: Secondary | ICD-10-CM | POA: Diagnosis not present

## 2018-06-16 DIAGNOSIS — Z5181 Encounter for therapeutic drug level monitoring: Secondary | ICD-10-CM | POA: Diagnosis not present

## 2018-06-16 DIAGNOSIS — I2510 Atherosclerotic heart disease of native coronary artery without angina pectoris: Secondary | ICD-10-CM | POA: Diagnosis not present

## 2018-06-16 DIAGNOSIS — I69351 Hemiplegia and hemiparesis following cerebral infarction affecting right dominant side: Secondary | ICD-10-CM | POA: Diagnosis not present

## 2018-06-16 DIAGNOSIS — Z7901 Long term (current) use of anticoagulants: Secondary | ICD-10-CM | POA: Diagnosis not present

## 2018-06-16 DIAGNOSIS — G40909 Epilepsy, unspecified, not intractable, without status epilepticus: Secondary | ICD-10-CM | POA: Diagnosis not present

## 2018-06-16 DIAGNOSIS — E669 Obesity, unspecified: Secondary | ICD-10-CM | POA: Diagnosis not present

## 2018-06-19 ENCOUNTER — Telehealth: Payer: Self-pay | Admitting: Family Medicine

## 2018-06-19 DIAGNOSIS — G912 (Idiopathic) normal pressure hydrocephalus: Secondary | ICD-10-CM | POA: Diagnosis not present

## 2018-06-19 DIAGNOSIS — D72829 Elevated white blood cell count, unspecified: Secondary | ICD-10-CM

## 2018-06-19 DIAGNOSIS — I252 Old myocardial infarction: Secondary | ICD-10-CM | POA: Diagnosis not present

## 2018-06-19 DIAGNOSIS — G40909 Epilepsy, unspecified, not intractable, without status epilepticus: Secondary | ICD-10-CM | POA: Diagnosis not present

## 2018-06-19 DIAGNOSIS — I69351 Hemiplegia and hemiparesis following cerebral infarction affecting right dominant side: Secondary | ICD-10-CM | POA: Diagnosis not present

## 2018-06-19 DIAGNOSIS — I69398 Other sequelae of cerebral infarction: Secondary | ICD-10-CM | POA: Diagnosis not present

## 2018-06-19 DIAGNOSIS — B9620 Unspecified Escherichia coli [E. coli] as the cause of diseases classified elsewhere: Secondary | ICD-10-CM

## 2018-06-19 DIAGNOSIS — N390 Urinary tract infection, site not specified: Secondary | ICD-10-CM

## 2018-06-19 DIAGNOSIS — H8109 Meniere's disease, unspecified ear: Secondary | ICD-10-CM | POA: Diagnosis not present

## 2018-06-19 NOTE — Telephone Encounter (Signed)
PT - 58.5 & INR - 4.9   warfarin (COUMADIN) 7.5 MG tablet  1 ordered        Summary: TAKE 1 AND 1/2 TABLET BY MOUTH Tuesday, Thursday AND Saturday. TAKE ONE TABLET ON Sunday, Monday, Wednesday AND Friday.      Per [name] no coumadin x 3 days and recheck and call us with the value.  Cathy with [facility] received orders

## 2018-06-20 ENCOUNTER — Encounter: Payer: Self-pay | Admitting: Registered Nurse

## 2018-06-20 ENCOUNTER — Other Ambulatory Visit: Payer: Self-pay

## 2018-06-20 ENCOUNTER — Encounter: Payer: Medicare Other | Attending: Registered Nurse | Admitting: Registered Nurse

## 2018-06-20 DIAGNOSIS — I69398 Other sequelae of cerebral infarction: Secondary | ICD-10-CM | POA: Diagnosis not present

## 2018-06-20 DIAGNOSIS — Z8673 Personal history of transient ischemic attack (TIA), and cerebral infarction without residual deficits: Secondary | ICD-10-CM

## 2018-06-20 DIAGNOSIS — I480 Paroxysmal atrial fibrillation: Secondary | ICD-10-CM | POA: Diagnosis not present

## 2018-06-20 DIAGNOSIS — I10 Essential (primary) hypertension: Secondary | ICD-10-CM

## 2018-06-20 DIAGNOSIS — E118 Type 2 diabetes mellitus with unspecified complications: Secondary | ICD-10-CM

## 2018-06-20 DIAGNOSIS — I69351 Hemiplegia and hemiparesis following cerebral infarction affecting right dominant side: Secondary | ICD-10-CM | POA: Diagnosis not present

## 2018-06-20 DIAGNOSIS — G40909 Epilepsy, unspecified, not intractable, without status epilepticus: Secondary | ICD-10-CM

## 2018-06-20 DIAGNOSIS — G912 (Idiopathic) normal pressure hydrocephalus: Secondary | ICD-10-CM

## 2018-06-20 DIAGNOSIS — H8109 Meniere's disease, unspecified ear: Secondary | ICD-10-CM | POA: Diagnosis not present

## 2018-06-20 DIAGNOSIS — I252 Old myocardial infarction: Secondary | ICD-10-CM | POA: Diagnosis not present

## 2018-06-20 NOTE — Progress Notes (Signed)
Virtual Transitional Care call  Patient name: [name] DOB: [birth year] 1. Are you/is patient experiencing any problems since coming home? No a. Are there any questions regarding any aspect of care? No 2. Are there any questions regarding medications administration/dosing? No a. Are meds being taken as prescribed? Yes b. "Patient should review meds with caller to confirm" Medication List Reviewed 3. Have there been any falls? No 4. Has Home Health been to the house and/or have they contacted you? Yes, [facility] a. If not, have you tried to contact them? NA b. Can we help you contact them? NA 5. Are bowels and bladder emptying properly? Yes a. Are there any unexpected incontinence issues? No b. If applicable, is patient following bowel/bladder programs? NA 6. Any fevers, problems with breathing, unexpected pain? No 7. Are there any skin problems or new areas of breakdown? No 8. Has the patient/family member arranged specialty MD follow up (ie cardiology/neurology/renal/surgical/etc.)?  [name] was given [facility] he will call to schedule HFU he reports. Also instructed to call [name] for [place] appointment he verbalizes understanding.  a. Can we help arrange? NA 9. Does the patient need any other services or support that we can help arrange? No 10. Are caregivers following through as expected in assisting the patient? Yes 11. Has the patient quit smoking, drinking alcohol, or using drugs as recommended? (                        )  Appointment date/time 07/19/2018  arrival time 2:20 for 2:40 appointment with Dr. Naaman Plummer. At [place]

## 2018-06-22 ENCOUNTER — Telehealth: Payer: Self-pay | Admitting: *Deleted

## 2018-06-22 ENCOUNTER — Telehealth: Payer: Self-pay

## 2018-06-22 DIAGNOSIS — I69398 Other sequelae of cerebral infarction: Secondary | ICD-10-CM | POA: Diagnosis not present

## 2018-06-22 DIAGNOSIS — I69351 Hemiplegia and hemiparesis following cerebral infarction affecting right dominant side: Secondary | ICD-10-CM | POA: Diagnosis not present

## 2018-06-22 DIAGNOSIS — G40909 Epilepsy, unspecified, not intractable, without status epilepticus: Secondary | ICD-10-CM | POA: Diagnosis not present

## 2018-06-22 DIAGNOSIS — G912 (Idiopathic) normal pressure hydrocephalus: Secondary | ICD-10-CM | POA: Diagnosis not present

## 2018-06-22 DIAGNOSIS — H8109 Meniere's disease, unspecified ear: Secondary | ICD-10-CM | POA: Diagnosis not present

## 2018-06-22 DIAGNOSIS — I252 Old myocardial infarction: Secondary | ICD-10-CM | POA: Diagnosis not present

## 2018-06-22 NOTE — Telephone Encounter (Signed)
Received call from Reita Chard, [facility] with [facility] [phone]~ telephone.   Reports that patient PT 34.0/ INR 1.8. Patient has held Coumadin x3 days.   MD made aware and NO obtained.  Resume Coumadin 7.5mg  (1) tab PO QD.  Repeat INR in 1 week.   Call placed to [place], [facility] SN and VO given.  Call placed to patient. No answer, no VM.

## 2018-06-22 NOTE — Telephone Encounter (Signed)
[name] from [facility] called stating that patient refused assessment today for ST so she going out on Tuesday to do the assessment.

## 2018-06-23 ENCOUNTER — Telehealth: Payer: Self-pay | Admitting: *Deleted

## 2018-06-23 DIAGNOSIS — I252 Old myocardial infarction: Secondary | ICD-10-CM | POA: Diagnosis not present

## 2018-06-23 DIAGNOSIS — I69351 Hemiplegia and hemiparesis following cerebral infarction affecting right dominant side: Secondary | ICD-10-CM | POA: Diagnosis not present

## 2018-06-23 DIAGNOSIS — G912 (Idiopathic) normal pressure hydrocephalus: Secondary | ICD-10-CM | POA: Diagnosis not present

## 2018-06-23 DIAGNOSIS — G40909 Epilepsy, unspecified, not intractable, without status epilepticus: Secondary | ICD-10-CM | POA: Diagnosis not present

## 2018-06-23 DIAGNOSIS — I69398 Other sequelae of cerebral infarction: Secondary | ICD-10-CM | POA: Diagnosis not present

## 2018-06-23 DIAGNOSIS — H8109 Meniere's disease, unspecified ear: Secondary | ICD-10-CM | POA: Diagnosis not present

## 2018-06-23 NOTE — Telephone Encounter (Signed)
Dierdre, ST, [facility] home [facility] left a message srtating she needed a verbal order to reinitiate home health speech therapy evaluation.  Medical record reviewed. Social work note reviewed.  Verbal orders given per office protocol.

## 2018-06-26 ENCOUNTER — Telehealth: Payer: Self-pay | Admitting: Physical Medicine & Rehabilitation

## 2018-06-26 ENCOUNTER — Inpatient Hospital Stay: Payer: Medicare Other | Admitting: Family Medicine

## 2018-06-26 DIAGNOSIS — I252 Old myocardial infarction: Secondary | ICD-10-CM | POA: Diagnosis not present

## 2018-06-26 DIAGNOSIS — G912 (Idiopathic) normal pressure hydrocephalus: Secondary | ICD-10-CM | POA: Diagnosis not present

## 2018-06-26 DIAGNOSIS — G40909 Epilepsy, unspecified, not intractable, without status epilepticus: Secondary | ICD-10-CM | POA: Diagnosis not present

## 2018-06-26 DIAGNOSIS — I69351 Hemiplegia and hemiparesis following cerebral infarction affecting right dominant side: Secondary | ICD-10-CM | POA: Diagnosis not present

## 2018-06-26 DIAGNOSIS — H8109 Meniere's disease, unspecified ear: Secondary | ICD-10-CM | POA: Diagnosis not present

## 2018-06-26 DIAGNOSIS — I69398 Other sequelae of cerebral infarction: Secondary | ICD-10-CM | POA: Diagnosis not present

## 2018-06-26 NOTE — Telephone Encounter (Signed)
Verbal order given, forwarded to Dr. Naaman Plummer, [facility]

## 2018-06-26 NOTE — Telephone Encounter (Signed)
[name] ?? With [facility] needs to get a verbal ok to d/c speech therapy.  Patient has declined.  Please call her at [phone].

## 2018-06-27 ENCOUNTER — Telehealth: Payer: Self-pay | Admitting: *Deleted

## 2018-06-27 DIAGNOSIS — I252 Old myocardial infarction: Secondary | ICD-10-CM | POA: Diagnosis not present

## 2018-06-27 DIAGNOSIS — G40909 Epilepsy, unspecified, not intractable, without status epilepticus: Secondary | ICD-10-CM | POA: Diagnosis not present

## 2018-06-27 DIAGNOSIS — H8109 Meniere's disease, unspecified ear: Secondary | ICD-10-CM | POA: Diagnosis not present

## 2018-06-27 DIAGNOSIS — G912 (Idiopathic) normal pressure hydrocephalus: Secondary | ICD-10-CM | POA: Diagnosis not present

## 2018-06-27 DIAGNOSIS — I69398 Other sequelae of cerebral infarction: Secondary | ICD-10-CM | POA: Diagnosis not present

## 2018-06-27 DIAGNOSIS — I69351 Hemiplegia and hemiparesis following cerebral infarction affecting right dominant side: Secondary | ICD-10-CM | POA: Diagnosis not present

## 2018-06-27 NOTE — Telephone Encounter (Signed)
[facility] OT requesting 2wk 3.  Approval given.

## 2018-06-28 DIAGNOSIS — G40909 Epilepsy, unspecified, not intractable, without status epilepticus: Secondary | ICD-10-CM | POA: Diagnosis not present

## 2018-06-28 DIAGNOSIS — G912 (Idiopathic) normal pressure hydrocephalus: Secondary | ICD-10-CM | POA: Diagnosis not present

## 2018-06-28 DIAGNOSIS — I69398 Other sequelae of cerebral infarction: Secondary | ICD-10-CM | POA: Diagnosis not present

## 2018-06-28 DIAGNOSIS — I69351 Hemiplegia and hemiparesis following cerebral infarction affecting right dominant side: Secondary | ICD-10-CM | POA: Diagnosis not present

## 2018-06-28 DIAGNOSIS — H8109 Meniere's disease, unspecified ear: Secondary | ICD-10-CM | POA: Diagnosis not present

## 2018-06-28 DIAGNOSIS — I252 Old myocardial infarction: Secondary | ICD-10-CM | POA: Diagnosis not present

## 2018-06-29 ENCOUNTER — Telehealth: Payer: Self-pay | Admitting: Family Medicine

## 2018-06-29 DIAGNOSIS — G40909 Epilepsy, unspecified, not intractable, without status epilepticus: Secondary | ICD-10-CM | POA: Diagnosis not present

## 2018-06-29 DIAGNOSIS — H8109 Meniere's disease, unspecified ear: Secondary | ICD-10-CM | POA: Diagnosis not present

## 2018-06-29 DIAGNOSIS — I69351 Hemiplegia and hemiparesis following cerebral infarction affecting right dominant side: Secondary | ICD-10-CM | POA: Diagnosis not present

## 2018-06-29 DIAGNOSIS — I252 Old myocardial infarction: Secondary | ICD-10-CM | POA: Diagnosis not present

## 2018-06-29 DIAGNOSIS — G912 (Idiopathic) normal pressure hydrocephalus: Secondary | ICD-10-CM | POA: Diagnosis not present

## 2018-06-29 DIAGNOSIS — I69398 Other sequelae of cerebral infarction: Secondary | ICD-10-CM | POA: Diagnosis not present

## 2018-06-29 NOTE — Telephone Encounter (Signed)
Spoke with Maudie Mercury and was told that patient's INR was 1.9. He is taking 7.5 MG of coumadin every evening. Please advise?

## 2018-06-29 NOTE — Telephone Encounter (Signed)
Continue current dose (2-3) is goal.  1.9 is close enough I feel. Recheck in 2 weeks.

## 2018-06-29 NOTE — Telephone Encounter (Signed)
Kim from home health calling to discuss I & r level  Please call her back at [phone]

## 2018-06-30 DIAGNOSIS — I69398 Other sequelae of cerebral infarction: Secondary | ICD-10-CM | POA: Diagnosis not present

## 2018-06-30 DIAGNOSIS — I252 Old myocardial infarction: Secondary | ICD-10-CM | POA: Diagnosis not present

## 2018-06-30 DIAGNOSIS — I69351 Hemiplegia and hemiparesis following cerebral infarction affecting right dominant side: Secondary | ICD-10-CM | POA: Diagnosis not present

## 2018-06-30 DIAGNOSIS — H8109 Meniere's disease, unspecified ear: Secondary | ICD-10-CM | POA: Diagnosis not present

## 2018-06-30 DIAGNOSIS — G912 (Idiopathic) normal pressure hydrocephalus: Secondary | ICD-10-CM | POA: Diagnosis not present

## 2018-06-30 DIAGNOSIS — G40909 Epilepsy, unspecified, not intractable, without status epilepticus: Secondary | ICD-10-CM | POA: Diagnosis not present

## 2018-07-03 DIAGNOSIS — G912 (Idiopathic) normal pressure hydrocephalus: Secondary | ICD-10-CM | POA: Diagnosis not present

## 2018-07-03 DIAGNOSIS — I252 Old myocardial infarction: Secondary | ICD-10-CM | POA: Diagnosis not present

## 2018-07-03 DIAGNOSIS — H8109 Meniere's disease, unspecified ear: Secondary | ICD-10-CM | POA: Diagnosis not present

## 2018-07-03 DIAGNOSIS — I69351 Hemiplegia and hemiparesis following cerebral infarction affecting right dominant side: Secondary | ICD-10-CM | POA: Diagnosis not present

## 2018-07-03 DIAGNOSIS — I69398 Other sequelae of cerebral infarction: Secondary | ICD-10-CM | POA: Diagnosis not present

## 2018-07-03 DIAGNOSIS — G40909 Epilepsy, unspecified, not intractable, without status epilepticus: Secondary | ICD-10-CM | POA: Diagnosis not present

## 2018-07-03 NOTE — Telephone Encounter (Signed)
Spoke with Maudie Mercury the [facility] Nurse and informed her per [name] to continue same dose and recheck in 2 weeks. Kim verbalized understanding.

## 2018-07-04 DIAGNOSIS — G912 (Idiopathic) normal pressure hydrocephalus: Secondary | ICD-10-CM | POA: Diagnosis not present

## 2018-07-04 DIAGNOSIS — H8109 Meniere's disease, unspecified ear: Secondary | ICD-10-CM | POA: Diagnosis not present

## 2018-07-04 DIAGNOSIS — G40909 Epilepsy, unspecified, not intractable, without status epilepticus: Secondary | ICD-10-CM | POA: Diagnosis not present

## 2018-07-04 DIAGNOSIS — I252 Old myocardial infarction: Secondary | ICD-10-CM | POA: Diagnosis not present

## 2018-07-04 DIAGNOSIS — I69351 Hemiplegia and hemiparesis following cerebral infarction affecting right dominant side: Secondary | ICD-10-CM | POA: Diagnosis not present

## 2018-07-04 DIAGNOSIS — I69398 Other sequelae of cerebral infarction: Secondary | ICD-10-CM | POA: Diagnosis not present

## 2018-07-05 DIAGNOSIS — Z6830 Body mass index (BMI) 30.0-30.9, adult: Secondary | ICD-10-CM

## 2018-07-05 DIAGNOSIS — E669 Obesity, unspecified: Secondary | ICD-10-CM

## 2018-07-05 DIAGNOSIS — H8109 Meniere's disease, unspecified ear: Secondary | ICD-10-CM | POA: Diagnosis not present

## 2018-07-05 DIAGNOSIS — G912 (Idiopathic) normal pressure hydrocephalus: Secondary | ICD-10-CM | POA: Diagnosis not present

## 2018-07-05 DIAGNOSIS — Z5181 Encounter for therapeutic drug level monitoring: Secondary | ICD-10-CM

## 2018-07-05 DIAGNOSIS — I69398 Other sequelae of cerebral infarction: Secondary | ICD-10-CM | POA: Diagnosis not present

## 2018-07-05 DIAGNOSIS — I110 Hypertensive heart disease with heart failure: Secondary | ICD-10-CM | POA: Diagnosis not present

## 2018-07-05 DIAGNOSIS — I5031 Acute diastolic (congestive) heart failure: Secondary | ICD-10-CM | POA: Diagnosis not present

## 2018-07-05 DIAGNOSIS — E119 Type 2 diabetes mellitus without complications: Secondary | ICD-10-CM | POA: Diagnosis not present

## 2018-07-05 DIAGNOSIS — Z7901 Long term (current) use of anticoagulants: Secondary | ICD-10-CM

## 2018-07-05 DIAGNOSIS — G40909 Epilepsy, unspecified, not intractable, without status epilepticus: Secondary | ICD-10-CM | POA: Diagnosis not present

## 2018-07-05 DIAGNOSIS — I2510 Atherosclerotic heart disease of native coronary artery without angina pectoris: Secondary | ICD-10-CM | POA: Diagnosis not present

## 2018-07-05 DIAGNOSIS — I69351 Hemiplegia and hemiparesis following cerebral infarction affecting right dominant side: Secondary | ICD-10-CM | POA: Diagnosis not present

## 2018-07-05 DIAGNOSIS — I480 Paroxysmal atrial fibrillation: Secondary | ICD-10-CM | POA: Diagnosis not present

## 2018-07-05 DIAGNOSIS — Z9861 Coronary angioplasty status: Secondary | ICD-10-CM

## 2018-07-05 DIAGNOSIS — I252 Old myocardial infarction: Secondary | ICD-10-CM

## 2018-07-05 DIAGNOSIS — I4892 Unspecified atrial flutter: Secondary | ICD-10-CM | POA: Diagnosis not present

## 2018-07-05 DIAGNOSIS — Z87891 Personal history of nicotine dependence: Secondary | ICD-10-CM

## 2018-07-06 DIAGNOSIS — I69351 Hemiplegia and hemiparesis following cerebral infarction affecting right dominant side: Secondary | ICD-10-CM | POA: Diagnosis not present

## 2018-07-06 DIAGNOSIS — I69398 Other sequelae of cerebral infarction: Secondary | ICD-10-CM | POA: Diagnosis not present

## 2018-07-06 DIAGNOSIS — H8109 Meniere's disease, unspecified ear: Secondary | ICD-10-CM | POA: Diagnosis not present

## 2018-07-06 DIAGNOSIS — G912 (Idiopathic) normal pressure hydrocephalus: Secondary | ICD-10-CM | POA: Diagnosis not present

## 2018-07-06 DIAGNOSIS — I252 Old myocardial infarction: Secondary | ICD-10-CM | POA: Diagnosis not present

## 2018-07-06 DIAGNOSIS — G40909 Epilepsy, unspecified, not intractable, without status epilepticus: Secondary | ICD-10-CM | POA: Diagnosis not present

## 2018-07-07 ENCOUNTER — Other Ambulatory Visit: Payer: Self-pay | Admitting: Physical Medicine and Rehabilitation

## 2018-07-07 DIAGNOSIS — G912 (Idiopathic) normal pressure hydrocephalus: Secondary | ICD-10-CM | POA: Diagnosis not present

## 2018-07-07 DIAGNOSIS — G40909 Epilepsy, unspecified, not intractable, without status epilepticus: Secondary | ICD-10-CM | POA: Diagnosis not present

## 2018-07-07 DIAGNOSIS — I69351 Hemiplegia and hemiparesis following cerebral infarction affecting right dominant side: Secondary | ICD-10-CM | POA: Diagnosis not present

## 2018-07-07 DIAGNOSIS — H8109 Meniere's disease, unspecified ear: Secondary | ICD-10-CM | POA: Diagnosis not present

## 2018-07-07 DIAGNOSIS — I69398 Other sequelae of cerebral infarction: Secondary | ICD-10-CM | POA: Diagnosis not present

## 2018-07-07 DIAGNOSIS — I252 Old myocardial infarction: Secondary | ICD-10-CM | POA: Diagnosis not present

## 2018-07-11 ENCOUNTER — Ambulatory Visit (INDEPENDENT_AMBULATORY_CARE_PROVIDER_SITE_OTHER): Payer: Medicare Other | Admitting: Family Medicine

## 2018-07-11 ENCOUNTER — Encounter: Payer: Self-pay | Admitting: Family Medicine

## 2018-07-11 ENCOUNTER — Telehealth: Payer: Self-pay

## 2018-07-11 ENCOUNTER — Other Ambulatory Visit: Payer: Self-pay

## 2018-07-11 VITALS — BP 120/70 | HR 74 | Temp 98.4°F | Resp 16 | Ht 65.0 in | Wt 210.0 lb

## 2018-07-11 DIAGNOSIS — H8109 Meniere's disease, unspecified ear: Secondary | ICD-10-CM | POA: Diagnosis not present

## 2018-07-11 DIAGNOSIS — R350 Frequency of micturition: Secondary | ICD-10-CM

## 2018-07-11 DIAGNOSIS — I252 Old myocardial infarction: Secondary | ICD-10-CM | POA: Diagnosis not present

## 2018-07-11 DIAGNOSIS — G912 (Idiopathic) normal pressure hydrocephalus: Secondary | ICD-10-CM | POA: Diagnosis not present

## 2018-07-11 DIAGNOSIS — G40909 Epilepsy, unspecified, not intractable, without status epilepticus: Secondary | ICD-10-CM | POA: Diagnosis not present

## 2018-07-11 DIAGNOSIS — I69398 Other sequelae of cerebral infarction: Secondary | ICD-10-CM | POA: Diagnosis not present

## 2018-07-11 DIAGNOSIS — I69351 Hemiplegia and hemiparesis following cerebral infarction affecting right dominant side: Secondary | ICD-10-CM | POA: Diagnosis not present

## 2018-07-11 MED ORDER — CIPROFLOXACIN HCL 500 MG PO TABS: 500.0000 mg | ORAL_TABLET | Freq: Two times a day (BID) | ORAL | 0 refills | Status: DC

## 2018-07-11 NOTE — Telephone Encounter (Signed)
This encounter was created in error - please disregard.

## 2018-07-11 NOTE — Progress Notes (Signed)
Subjective:    Patient ID: [name], male    DOB: 1933/05/24, 85 y.o.   MRN: [id number]  HPI Patient was recently admitted to the hospital in March.  I have copied relevant portions of the discharge summary and included them below for my reference: Admit date: 06/01/2018 Discharge date: 06/15/2018  Discharge Diagnoses:  Principal Problem:   Normal pressure hydrocephalus ([place]) Active Problems:   HTN (hypertension)   Controlled type 2 diabetes mellitus with complication, without long-term current use of insulin (HCC)   Seizure disorder (HCC)   E. coli UTI   Leucocytosis   Discharged Condition: stable   Significant Diagnostic Studies: N/A   Labs:  Basic Metabolic Panel: BMP Latest Ref Rng & Units 06/14/2018 06/07/2018 06/02/2018  Glucose 70 - 99 mg/dL 110(H) 119(H) 127(H)  BUN 8 - 23 mg/dL 25(H) 15 19  Creatinine 0.61 - 1.24 mg/dL 1.11 1.03 1.18  Sodium 135 - 145 mmol/L 136 135 136  Potassium 3.5 - 5.1 mmol/L 4.2 4.0 4.1  Chloride 98 - 111 mmol/L 101 104 100  CO2 22 - 32 mmol/L _0 Calcium 8.9 - 10.3 mg/dL 9.2 8.4(L) 9.0    CBC: CBC Latest Ref Rng & Units 06/13/2018 06/07/2018 06/05/2018  WBC 4.0 - 10.5 K/uL 12.8(H) 11.9(H) 15.8(H)  Hemoglobin 13.0 - 17.0 g/dL 11.4(L) 12.1(L) 11.7(L)  Hematocrit 39.0 - 52.0 % 35.7(L) 36.6(L) 37.3(L)  Platelets 150 - 400 K/uL 338 375 300    CBG: LastLabs         Recent Labs  Lab 06/14/18 0616 06/14/18 1159 06/14/18 1644 06/14/18 2127 06/15/18 0622  GLUCAP 98 114* 113* 95 108*      Brief HPI:   [name] is an 85 year old male with history of CVA with residual R-HP/facial droop, seizures, T2DM, A flutter- on coumadin, CAD, recent bronchitis flare who was admitted via ED on 05/30/18 with syncope, mental status changes, abnormal chewing movements as well as eye fluttering. Family also reported issues with urinary incontinence, shuffling gait and cognitive decline in the past year.   He was treated with  IVF but continued to have waxing and waning of mental status. EEG without epileptiform activity.  MRI brain revealed findings of mild chronic communicating hydrocephalus and neurology recommends follow up at [facility] clinic at [facility] for work up.    Hospital Course: Cormac Wint Rittenberry was admitted to rehab 06/01/2018 for inpatient therapies to consist of PT, ST and OT at least three hours five days a week. Past admission physiatrist, therapy team and rehab RN have worked together to provide customized collaborative inpatient rehab. He has been seizure free on tegretol bid and repeat levels were therapeutic.  He was found to have leucocytosis due to E coli UTI and was treated with Keflex X 5 days. Follow up CBC showed that leucocytosis is resolving. Serial check of lytes showed pre-renal azotemia and he has been encouraged to increase fluid intake.   Blood pressures have been monitored on bid basis and catapres was resumed for better control. Heart rate has been monitored on bid basis and is controlled on home dose metoprolol. Pharmacy has been assisting with dosing and management of coumadin. INR is therapeutic at discharge and he is to continue 11.25 mg on T,T,Sa and take 7.5 mg all other days. HHRN to draw protime on 3/30 and call results to primary MD. Patient with resultant cognitive deficits with confabulatory speech, word findings deficits, difficulty with processing and balance deficits affecting ADLs  and mobility. CIR recommended due to functional decline.   Rehab course: During patient's stay in rehab weekly team conferences were held to monitor patient's progress, set goals and discuss barriers to discharge. At admission, patient required mod assist with basic self care tasks and with mobility  He exhibited mild cognitive impairments in naming and moderate impairments in recall and orientation.  He has had improvement in activity tolerance, balance, postural control as well as ability to compensate for  deficits. He is able to complete ADL tasks with supervision. He requires supervision for transfers and to ambulate 150' with RW.  He requires supervision for complex problem solving and min verbal cues to recall daily and functional information. Family education completed regarding assistance and need for supervision after discharge.   07/11/18 Patient presents today with his daughter.  Yesterday he had what appears to be another seizure-like episode.  He was sitting at his chair eating dinner.  Suddenly became unresponsive.  His muscles became very stiff.  His son was unable to get him to respond to questions.  He no recollection.  His daughter states that he was stiff all over similar to a seizure.  There was no shaking.  Episode lasted 2 minutes and that he gradually regained consciousness.  Story sounds like a clonic seizure.  He has a history of seizures and is on Tegretol for this.  He is concerned that he may have a bladder infection.  Urinalysis reveals +3 blood, positive leukocyte esterase, positive nitrites.  He reports urinary frequency and dysuria.  He is currently on Coumadin and his last INR was approximately 2 on April 9. Past Medical History:  Diagnosis Date  . Atrial flutter ([place])   . BPH (benign prostatic hyperplasia)   . Coronary artery disease    a. s/p MI tx with Cypher DES to pRCA in 4/04;  b. Echocardiogram 7/09: Normal LV function.  c. Nuclear study 3/13 no ischemia;  d. ETT 2/14 neg;  e. admx with CP => LHC (01/30/2013):  pLAD 40-50%, oD1 70-80%, oOM1 40%, pRCA stent patent, mid RCA 30%, EF 60-65%. => med Rx.  . DDD (degenerative disc disease)   . Diabetes mellitus without complication ([place])   . Dyslipidemia   . Hemorrhoids   . Hx MRSA infection    left buttocks abscess  . Hx of echocardiogram    a. Echocardiogram (01/31/2013): Mild focal basal and mild concentric hypertrophy of the septum, EF 50-55%, normal wall motion, grade 1 diastolic dysfunction, trivial AI, MAC, mild  LAE, PASP 35  . Hyperlipidemia   . Hypertension   . Hypothyroidism   . Meniere's disease    Status post shunt  . Myocardial infarction ([place]) 2008  . Occlusion and stenosis of carotid artery without mention of cerebral infarction    40-59% on carotid doppler 2014; [place] (01/2013): R 1-39%, L 60-79%  . Rotator cuff injury    chronic rotator cuff injury status post repair  . Seizure disorder ([place])   . Stroke [facility])    a. 01/2013=> post cardiac cath CVA to L post communicating artery system; R sided weakness  . Syncope    Past Surgical History:  Procedure Laterality Date  . COLONOSCOPY    . CORONARY ANGIOPLASTY WITH STENT PLACEMENT  07/14/2002   Stent to the right coronary artery  . ENDOLYMPHATIC SHUNT DECOMPRESSION  06/11/2009    Right endolymphatic sac decompression and shunt  placement  . HERNIA REPAIR  04/10/2008   scrotal hernia repair  . LEFT  HEART CATHETERIZATION WITH CORONARY ANGIOGRAM N/A 01/30/2013   Procedure: LEFT HEART CATHETERIZATION WITH CORONARY ANGIOGRAM;  Surgeon: Larey Dresser, MD;  Location: [facility] CATH LAB;  Service: Cardiovascular;  Laterality: N/A;  . ROTATOR CUFF REPAIR     for chronic rotator cuff injury   Current Outpatient Medications on File Prior to Visit  Medication Sig Dispense Refill  . acetaminophen (TYLENOL) 325 MG tablet Take 1-2 tablets (325-650 mg total) by mouth every 4 (four) hours as needed for mild pain.    Marland Kitchen albuterol (PROVENTIL HFA;VENTOLIN HFA) 108 (90 Base) MCG/ACT inhaler Inhale 2 puffs into the lungs every 4 (four) hours as needed for wheezing or shortness of breath. 1 Inhaler 0  . atorvastatin (LIPITOR) 80 MG tablet Take 1 tablet (80 mg total) by mouth daily at 6 PM. 30 tablet 0  . carbamazepine (CARBATROL) 300 MG 12 hr capsule Take 1 capsule (300 mg total) by mouth 2 (two) times daily. 30 capsule 0  . cloNIDine (CATAPRES) 0.2 MG tablet Take 1 tablet (0.2 mg total) by mouth 2 (two) times daily. 60 tablet 1  . escitalopram (LEXAPRO) 5 MG  tablet TAKE 1 TABLET BY MOUTH EVERYDAY AT BEDTIME 90 tablet 1  . isosorbide mononitrate (IMDUR) 30 MG 24 hr tablet Take 1 tablet (30 mg total) by mouth daily. 60 tablet 1  . levothyroxine (SYNTHROID, LEVOTHROID) 75 MCG tablet TAKE 1 TABLET BY MOUTH EVERY DAY (Patient taking differently: Take 75 mcg by mouth daily. ) 90 tablet 3  . losartan (COZAAR) 100 MG tablet Take 0.5 tablets (50 mg total) by mouth 2 (two) times daily. 60 tablet 1  . metFORMIN (GLUCOPHAGE) 500 MG tablet TAKE 1/2 TABLET (250 MG) BY MOUTH 2 TIMES DAILY WITH A MEAL. 30 tablet 1  . metoprolol tartrate (LOPRESSOR) 100 MG tablet Take 1 tablet (100 mg total) by mouth 2 (two) times daily. 60 tablet 1  . Multiple Vitamins-Minerals (MULTIVITAMINS THER. W/MINERALS) TABS Take 1 tablet by mouth at bedtime.      Marland Kitchen warfarin (COUMADIN) 7.5 MG tablet 1&1/2 TAB TUESDAY, THURSDAY AND SATURDAY*1 TAB ON SUNDAY, MONDAY, WEDNESDAY AND FRIDAY WITH SUPPER 80 tablet 1   No current facility-administered medications on file prior to visit.    No Known Allergies Social History   Socioeconomic History  . Marital status: Married    Spouse name: Not on file  . Number of children: 4  . Years of education: 12th  . Highest education level: Not on file  Occupational History  . Occupation: Retired  Scientific laboratory technician  . Financial resource strain: Not on file  . Food insecurity:    Worry: Not on file    Inability: Not on file  . Transportation needs:    Medical: Not on file    Non-medical: Not on file  Tobacco Use  . Smoking status: Former Smoker    Years: 5.00    Types: Cigarettes    Last attempt to quit: 08/19/1956    Years since quitting: 61.9  . Smokeless tobacco: Former Systems developer    Types: Chew  Substance and Sexual Activity  . Alcohol use: No  . Drug use: No  . Sexual activity: Never  Lifestyle  . Physical activity:    Days per week: Not on file    Minutes per session: Not on file  . Stress: Not on file  Relationships  . Social connections:     Talks on phone: Not on file    Gets together: Not on file  Attends religious service: Not on file    Active member of club or organization: Not on file    Attends meetings of clubs or organizations: Not on file    Relationship status: Not on file  . Intimate partner violence:    Fear of current or ex partner: Not on file    Emotionally abused: Not on file    Physically abused: Not on file    Forced sexual activity: Not on file  Other Topics Concern  . Not on file  Social History Narrative   Lives with wife.        Review of Systems  All other systems reviewed and are negative.      Objective:   Physical Exam Vitals signs reviewed.  Cardiovascular:     Rate and Rhythm: Normal rate. Rhythm irregular.     Heart sounds: Normal heart sounds.  Pulmonary:     Effort: Pulmonary effort is normal. No respiratory distress.     Breath sounds: Normal breath sounds. No wheezing, rhonchi or rales.  Neurological:     General: No focal deficit present.     Mental Status: He is oriented to person, place, and time. Mental status is at baseline.     Cranial Nerves: No cranial nerve deficit.     Sensory: No sensory deficit.     Motor: Weakness present.     Coordination: Coordination abnormal.     Gait: Gait abnormal.  Psychiatric:        Mood and Affect: Mood normal.        Behavior: Behavior normal.        Thought Content: Thought content normal.        Judgment: Judgment normal.           Assessment & Plan:  Frequent urination - Plan: Urinalysis, Routine w reflex microscopic  Patient appears to have a urinary back infection and likely prostatitis.  I will treat him with Cipro twice daily for 7 days.  Decrease Coumadin dose to 1/2 tablet on Tuesday and Friday and 1 tablet all other days.  Recheck here on Monday of next week to recheck INR as Coumadin is affected by Cipro which increases its anticoagulant effect.  I question if the urinary tract infection lowered her  seizure threshold and trigger the clonic seizure.  When the patient returns I will check a Tegretol level.  If patient continues to have seizure spells, we will need to possibly add a second antiepileptic medication.

## 2018-07-11 NOTE — Telephone Encounter (Signed)
I contacted the pt in regards to his 07/21/18 8:30 appt. Pt was advised, due to current COVID 19 pandemic, our office is severely reducing in office visits, in order to minimize the risk to our patients and healthcare providers.   Pt was offered a video visit with Dr. Jannifer Franklin and pt declined due to him not having a video capable device.  Pt rescheduled to 09/21/18 at 3 pm.

## 2018-07-12 NOTE — Telephone Encounter (Signed)
I attempted to reach the pt to complete his pre charting. I contacted the pt once and was in the process of completing when I had to end to call to assist with vv video visit.  When I tried to call back I was connected with his wife but then the call ended.

## 2018-07-13 ENCOUNTER — Ambulatory Visit (INDEPENDENT_AMBULATORY_CARE_PROVIDER_SITE_OTHER): Payer: Medicare Other | Admitting: Neurology

## 2018-07-13 ENCOUNTER — Encounter: Payer: Self-pay | Admitting: Neurology

## 2018-07-13 ENCOUNTER — Other Ambulatory Visit: Payer: Self-pay

## 2018-07-13 DIAGNOSIS — R55 Syncope and collapse: Secondary | ICD-10-CM

## 2018-07-13 DIAGNOSIS — I252 Old myocardial infarction: Secondary | ICD-10-CM | POA: Diagnosis not present

## 2018-07-13 DIAGNOSIS — R269 Unspecified abnormalities of gait and mobility: Secondary | ICD-10-CM | POA: Insufficient documentation

## 2018-07-13 DIAGNOSIS — G40909 Epilepsy, unspecified, not intractable, without status epilepticus: Secondary | ICD-10-CM

## 2018-07-13 DIAGNOSIS — I69351 Hemiplegia and hemiparesis following cerebral infarction affecting right dominant side: Secondary | ICD-10-CM | POA: Diagnosis not present

## 2018-07-13 DIAGNOSIS — I69398 Other sequelae of cerebral infarction: Secondary | ICD-10-CM | POA: Diagnosis not present

## 2018-07-13 DIAGNOSIS — G912 (Idiopathic) normal pressure hydrocephalus: Secondary | ICD-10-CM | POA: Diagnosis not present

## 2018-07-13 DIAGNOSIS — H8109 Meniere's disease, unspecified ear: Secondary | ICD-10-CM | POA: Diagnosis not present

## 2018-07-13 HISTORY — DX: Unspecified abnormalities of gait and mobility: R26.9

## 2018-07-13 MED ORDER — LEVETIRACETAM 250 MG PO TABS: 250.0000 mg | ORAL_TABLET | Freq: Two times a day (BID) | ORAL | 3 refills | Status: DC

## 2018-07-13 NOTE — Progress Notes (Signed)
Virtual Visit via Video Note  I connected with [name] on 07/13/18 at 11:30 AM EDT by a video enabled telemedicine application and verified that I am speaking with the correct person using two identifiers.   I discussed the limitations of evaluation and management by telemedicine and the availability of in person appointments. The patient expressed understanding and agreed to proceed.  The patient is at home, physician office.  History of Present Illness: [name] is an 85 year old white male with a history of cerebrovascular disease with a chronically occluded left posterior cerebral artery with a chronic stroke in this distribution.  The patient has been seen 5 years ago through this office following the stroke.  The patient eventually was found to have atrial fibrillation, he has been on Coumadin for about 2 years.  Shortly after being placed on Coumadin, he has had some problems with gait instability, he has had occasional falls, his ability to maintain balance has worsened over the last several months and he has begun using a walker on a daily basis since March 2020.  The patient has had 3 syncopal events, the first 1 in January 2020, the second 1 on 29 May 2018 and the third 1 occurred 1 week prior to this evaluation.  The patient claims that he has no warning with these events, he will become unconscious for about 2 or 3 minutes and then come around without much confusion.  He has not been noted to have any jerking or twitching.  During the March 2020 admission, he had an EEG study that was unremarkable.  He does have a history of seizures but the seizures previously he claims were associated with having "crazy thoughts" and hallucinations.  He has been on carbamazepine.  The most recent blackout episodes are not consistent with his seizure type events.  The patient reported no chest pain, palpitations of the heart or shortness of breath with the most recent blackout events.   The patient reports no new numbness or weakness of the face, arms, legs.  He has chronic right leg weakness following his stroke.  He has had frequent urinary tract infections, he reports some mild troubles with memory but this is not a severe issue.  The patient had MRI of the brain in March 2020, they reported some mild hydrocephalus, the question of normal pressure hydrocephalus was entertained.  MRA of the head otherwise showed no new arterial blockages.   Observations/Objective: WebEx evaluation reveals that the patient is alert and cooperative.  He has full extraocular movements.  Facial symmetry is present.  He is able to protrude the tongue midline with good lateral movements of the tongue.  He is able to speak normally, no aphasia or dysarthria is noted.  He appears to be answering questions appropriately.  The patient is able to form finger-nose-finger bilaterally.  He requires a walker for ambulation, he is able to ambulate safely with a walker.  He is able to stand with his feet close together, Romberg is negative.  Tandem gait was not attempted.  Assessment and Plan: 1.  Progressive gait disorder, questionable normal pressure hydrocephalus.  2.  Atrial fibrillation, chronic anticoagulation  3.  Recurrent syncope  4.  History of seizures  The patient will be placed on Keppra taking 200 mg twice daily in addition to the carbamazepine.  Is not clear what the etiology of these recurring episodes of syncope represent.  The patient will be set up for a 30-day monitoring  study of cardiac rhythm.  The most recent syncopal events do not appear to be consistent with his prior history of seizures.  The patient does have some mild ventriculomegaly that has become slightly more prominent since 2015.  The ventriculomegaly is associated with a moderate level of small vessel disease.  The gait issue will need to be evaluated in the future.  The most urgent work-up is in regards to the recurring episodes  of syncope.   MRI brain and MRA head 05/30/18:  IMPRESSION: MRI HEAD:  1. Motion degraded examination.  No acute intracranial process. 2. Old LEFT occipital/PCA territory infarct. Old small basal ganglia and LEFT thalamus infarcts. 3. Moderate chronic small vessel ischemic changes. 4. 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 It has  MRA HEAD:  1. No emergent large vessel occlusion or flow-limiting stenosis. 2. Chronically occluded LEFT PCA. Moderate stenosis RIGHT P2 Segment.  * MRI scan images were reviewed online. I agree with the written report.   Follow Up Instructions: Follow-up with me in 3 months.   I discussed the assessment and treatment plan with the patient. The patient was provided an opportunity to ask questions and all were answered. The patient agreed with the plan and demonstrated an understanding of the instructions.   The patient was advised to call back or seek an in-person evaluation if the symptoms worsen or if the condition fails to improve as anticipated.  I provided 30 minutes of non-face-to-face time during this encounter.   Kathrynn Ducking, MD

## 2018-07-14 ENCOUNTER — Telehealth: Payer: Self-pay | Admitting: Family Medicine

## 2018-07-14 DIAGNOSIS — I69398 Other sequelae of cerebral infarction: Secondary | ICD-10-CM | POA: Diagnosis not present

## 2018-07-14 DIAGNOSIS — I252 Old myocardial infarction: Secondary | ICD-10-CM | POA: Diagnosis not present

## 2018-07-14 DIAGNOSIS — I69351 Hemiplegia and hemiparesis following cerebral infarction affecting right dominant side: Secondary | ICD-10-CM | POA: Diagnosis not present

## 2018-07-14 DIAGNOSIS — G40909 Epilepsy, unspecified, not intractable, without status epilepticus: Secondary | ICD-10-CM | POA: Diagnosis not present

## 2018-07-14 DIAGNOSIS — H8109 Meniere's disease, unspecified ear: Secondary | ICD-10-CM | POA: Diagnosis not present

## 2018-07-14 DIAGNOSIS — G912 (Idiopathic) normal pressure hydrocephalus: Secondary | ICD-10-CM | POA: Diagnosis not present

## 2018-07-14 NOTE — Telephone Encounter (Signed)
The patient's daughter called the on-call provider tonight I returned her phone call at approximately 6:40 PM.  She was very concerned that her father has been confusing his medication.  She states that she has his meds in a container which divides them up into morning afternoon and evening medications.  Apparently today he has taken the evening medications be midday medications and still has the morning medications left.  She is concerned that perhaps he should not take them and then take the morning meds again tomorrow morning.  Reviewed with her at length patient's medications, most twice a day medicines are just fine to continue taking normally.  Only medicine I instructed her to hold is to hold him from taking Imdur this evening and then again tomorrow morning.  No other adjustments to any other medicines including his Coumadin or Synthroid which I explained to her should still have the overall same liability and effectiveness if still taking once a day although the time a day is slightly different.  She will be with him tomorrow morning to recheck the medicine container and make sure that he is taking them starting with morning meds.  Encouraged her to page again if he is having any symptoms.  Can follow up routinely with PCP otherwise

## 2018-07-16 DIAGNOSIS — I480 Paroxysmal atrial fibrillation: Secondary | ICD-10-CM | POA: Diagnosis not present

## 2018-07-16 DIAGNOSIS — G40909 Epilepsy, unspecified, not intractable, without status epilepticus: Secondary | ICD-10-CM | POA: Diagnosis not present

## 2018-07-16 DIAGNOSIS — I4892 Unspecified atrial flutter: Secondary | ICD-10-CM | POA: Diagnosis not present

## 2018-07-16 DIAGNOSIS — Z87891 Personal history of nicotine dependence: Secondary | ICD-10-CM | POA: Diagnosis not present

## 2018-07-16 DIAGNOSIS — I69351 Hemiplegia and hemiparesis following cerebral infarction affecting right dominant side: Secondary | ICD-10-CM | POA: Diagnosis not present

## 2018-07-16 DIAGNOSIS — E669 Obesity, unspecified: Secondary | ICD-10-CM | POA: Diagnosis not present

## 2018-07-16 DIAGNOSIS — I252 Old myocardial infarction: Secondary | ICD-10-CM | POA: Diagnosis not present

## 2018-07-16 DIAGNOSIS — E119 Type 2 diabetes mellitus without complications: Secondary | ICD-10-CM | POA: Diagnosis not present

## 2018-07-16 DIAGNOSIS — I5031 Acute diastolic (congestive) heart failure: Secondary | ICD-10-CM | POA: Diagnosis not present

## 2018-07-16 DIAGNOSIS — Z5181 Encounter for therapeutic drug level monitoring: Secondary | ICD-10-CM | POA: Diagnosis not present

## 2018-07-16 DIAGNOSIS — Z9861 Coronary angioplasty status: Secondary | ICD-10-CM | POA: Diagnosis not present

## 2018-07-16 DIAGNOSIS — I110 Hypertensive heart disease with heart failure: Secondary | ICD-10-CM | POA: Diagnosis not present

## 2018-07-16 DIAGNOSIS — I2510 Atherosclerotic heart disease of native coronary artery without angina pectoris: Secondary | ICD-10-CM | POA: Diagnosis not present

## 2018-07-16 DIAGNOSIS — H8109 Meniere's disease, unspecified ear: Secondary | ICD-10-CM | POA: Diagnosis not present

## 2018-07-16 DIAGNOSIS — I69398 Other sequelae of cerebral infarction: Secondary | ICD-10-CM | POA: Diagnosis not present

## 2018-07-16 DIAGNOSIS — Z6830 Body mass index (BMI) 30.0-30.9, adult: Secondary | ICD-10-CM | POA: Diagnosis not present

## 2018-07-16 DIAGNOSIS — Z7901 Long term (current) use of anticoagulants: Secondary | ICD-10-CM | POA: Diagnosis not present

## 2018-07-16 DIAGNOSIS — G912 (Idiopathic) normal pressure hydrocephalus: Secondary | ICD-10-CM | POA: Diagnosis not present

## 2018-07-17 ENCOUNTER — Other Ambulatory Visit: Payer: Self-pay

## 2018-07-17 ENCOUNTER — Ambulatory Visit (INDEPENDENT_AMBULATORY_CARE_PROVIDER_SITE_OTHER): Payer: Medicare Other | Admitting: Family Medicine

## 2018-07-17 ENCOUNTER — Encounter: Payer: Self-pay | Admitting: Family Medicine

## 2018-07-17 VITALS — BP 110/62 | HR 50 | Temp 98.1°F | Resp 16 | Ht 65.0 in | Wt 210.0 lb

## 2018-07-17 DIAGNOSIS — R55 Syncope and collapse: Secondary | ICD-10-CM | POA: Diagnosis not present

## 2018-07-17 DIAGNOSIS — N3000 Acute cystitis without hematuria: Secondary | ICD-10-CM | POA: Diagnosis not present

## 2018-07-17 DIAGNOSIS — I480 Paroxysmal atrial fibrillation: Secondary | ICD-10-CM

## 2018-07-17 LAB — PT WITH INR/FINGERSTICK
INR, fingerstick: 3 ratio — ABNORMAL HIGH
PT, fingerstick: 35.8 s — ABNORMAL HIGH (ref 10.5–13.1)

## 2018-07-17 MED ORDER — ATORVASTATIN CALCIUM 80 MG PO TABS: 80.0000 mg | ORAL_TABLET | Freq: Every day | ORAL | 1 refills | Status: DC

## 2018-07-17 NOTE — Progress Notes (Signed)
Subjective:    Patient ID: [name], male    DOB: [birth year], 85 y.o.   MRN: [id number]  HPI Patient was recently admitted to the hospital in March.  I have copied relevant portions of the discharge summary and included them below for my reference: Admit date: 06/01/2018 Discharge date: 06/15/2018  Discharge Diagnoses:  Principal Problem:   Normal pressure hydrocephalus ([place]) Active Problems:   HTN (hypertension)   Controlled type 2 diabetes mellitus with complication, without long-term current use of insulin (HCC)   Seizure disorder (HCC)   E. coli UTI   Leucocytosis   Discharged Condition: stable   Significant Diagnostic Studies: N/A   Labs:  Basic Metabolic Panel: BMP Latest Ref Rng & Units 06/14/2018 06/07/2018 06/02/2018  Glucose 70 - 99 mg/dL 110(H) 119(H) 127(H)  BUN 8 - 23 mg/dL 25(H) 15 19  Creatinine 0.61 - 1.24 mg/dL 1.11 1.03 1.18  Sodium 135 - 145 mmol/L 136 135 136  Potassium 3.5 - 5.1 mmol/L 4.2 4.0 4.1  Chloride 98 - 111 mmol/L 101 104 100  CO2 22 - 32 mmol/L _0 Calcium 8.9 - 10.3 mg/dL 9.2 8.4(L) 9.0    CBC: CBC Latest Ref Rng & Units 06/13/2018 06/07/2018 06/05/2018  WBC 4.0 - 10.5 K/uL 12.8(H) 11.9(H) 15.8(H)  Hemoglobin 13.0 - 17.0 g/dL 11.4(L) 12.1(L) 11.7(L)  Hematocrit 39.0 - 52.0 % 35.7(L) 36.6(L) 37.3(L)  Platelets 150 - 400 K/uL 338 375 300    CBG: LastLabs         Recent Labs  Lab 06/14/18 0616 06/14/18 1159 06/14/18 1644 06/14/18 2127 06/15/18 0622  GLUCAP 98 114* 113* 95 108*      Brief HPI:   [name] is an 85 year old male with history of CVA with residual R-HP/facial droop, seizures, T2DM, A flutter- on coumadin, CAD, recent bronchitis flare who was admitted via ED on 05/30/18 with syncope, mental status changes, abnormal chewing movements as well as eye fluttering. Family also reported issues with urinary incontinence, shuffling gait and cognitive decline in the past year.   He was treated with  IVF but continued to have waxing and waning of mental status. EEG without epileptiform activity.  MRI brain revealed findings of mild chronic communicating hydrocephalus and neurology recommends follow up at NPH clinic at [facility] for work up.    Hospital Course: [name] was admitted to rehab 06/01/2018 for inpatient therapies to consist of PT, ST and OT at least three hours five days a week. Past admission physiatrist, therapy team and rehab RN have worked together to provide customized collaborative inpatient rehab. He has been seizure free on tegretol bid and repeat levels were therapeutic.  He was found to have leucocytosis due to E coli UTI and was treated with Keflex X 5 days. Follow up CBC showed that leucocytosis is resolving. Serial check of lytes showed pre-renal azotemia and he has been encouraged to increase fluid intake.   Blood pressures have been monitored on bid basis and catapres was resumed for better control. Heart rate has been monitored on bid basis and is controlled on home dose metoprolol. Pharmacy has been assisting with dosing and management of coumadin. INR is therapeutic at discharge and he is to continue 11.25 mg on T,T,Sa and take 7.5 mg all other days. HHRN to draw protime on 3/30 and call results to primary MD. Patient with resultant cognitive deficits with confabulatory speech, word findings deficits, difficulty with processing and balance deficits affecting ADLs  and mobility. CIR recommended due to functional decline.   Rehab course: During patient's stay in rehab weekly team conferences were held to monitor patient's progress, set goals and discuss barriers to discharge. At admission, patient required mod assist with basic self care tasks and with mobility  He exhibited mild cognitive impairments in naming and moderate impairments in recall and orientation.  He has had improvement in activity tolerance, balance, postural control as well as ability to compensate for  deficits. He is able to complete ADL tasks with supervision. He requires supervision for transfers and to ambulate 150' with RW.  He requires supervision for complex problem solving and min verbal cues to recall daily and functional information. Family education completed regarding assistance and need for supervision after discharge.   07/11/18 Patient presents today with his daughter.  Yesterday he had what appears to be another seizure-like episode.  He was sitting at his chair eating dinner.  Suddenly became unresponsive.  His muscles became very stiff.  His son was unable to get him to respond to questions.  He no recollection.  His daughter states that he was stiff all over similar to a seizure.  There was no shaking.  Episode lasted 2 minutes and that he gradually regained consciousness.  Story sounds like a clonic seizure.  He has a history of seizures and is on Tegretol for this.  He is concerned that he may have a bladder infection.  Urinalysis reveals +3 blood, positive leukocyte esterase, positive nitrites.  He reports urinary frequency and dysuria.  He is currently on Coumadin and his last INR was approximately 2 on April 9.  At that time, my plan was: Patient appears to have a urinary back infection and likely prostatitis.  I will treat him with Cipro twice daily for 7 days.  Decrease Coumadin dose to 1/2 tablet on Tuesday and Friday and 1 tablet all other days.  Recheck here on Monday of next week to recheck INR as Coumadin is affected by Cipro which increases its anticoagulant effect.  I question if the urinary tract infection lowered her seizure threshold and trigger the clonic seizure.  When the patient returns I will check a Tegretol level.  If patient continues to have seizure spells, we will need to possibly add a second antiepileptic medication.  07/17/18 Patient is here today for follow-up and recheck his INR.  I reviewed his recent office visit with his neurologist, Dr. Jannifer Franklin.  Dr.  Jannifer Franklin has recommended a cardiac monitor to evaluate for causes of syncope.  However, he also added Keppra.  Therefore I would not check his carbamazepine level since he is now on 2 antiepileptics.  His TSH was normal in March.  His hemoglobin A1c was 6.7 in March.  I would recheck these again in June.  He completes his Cipro tomorrow for his urinary tract infection.  His INR is therapeutic at 3.0 Past Medical History:  Diagnosis Date   Atrial flutter ([place])    BPH (benign prostatic hyperplasia)    Coronary artery disease    a. s/p MI tx with Cypher DES to pRCA in 4/04;  b. Echocardiogram 7/09: Normal LV function.  c. Nuclear study 3/13 no ischemia;  d. ETT 2/14 neg;  e. admx with CP => LHC (01/30/2013):  pLAD 40-50%, oD1 70-80%, oOM1 40%, pRCA stent patent, mid RCA 30%, EF 60-65%. => med Rx.   DDD (degenerative disc disease)    Diabetes mellitus without complication (HCC)    Dyslipidemia  Gait disorder 07/13/2018   Hemorrhoids    Hx MRSA infection    left buttocks abscess   Hx of echocardiogram    a. Echocardiogram (01/31/2013): Mild focal basal and mild concentric hypertrophy of the septum, EF 50-55%, normal wall motion, grade 1 diastolic dysfunction, trivial AI, MAC, mild LAE, PASP 35   Hyperlipidemia    Hypertension    Hypothyroidism    Meniere's disease    Status post shunt   Myocardial infarction [facility]) 2008   Occlusion and stenosis of carotid artery without mention of cerebral infarction    40-59% on carotid doppler 2014; [place] (01/2013): R 1-39%, L 60-79%   Rotator cuff injury    chronic rotator cuff injury status post repair   Seizure disorder ([place])    Stroke ([place])    a. 01/2013=> post cardiac cath CVA to L post communicating artery system; R sided weakness   Syncope    Past Surgical History:  Procedure Laterality Date   COLONOSCOPY     CORONARY ANGIOPLASTY WITH STENT PLACEMENT  07/14/2002   Stent to the right coronary artery   ENDOLYMPHATIC SHUNT  DECOMPRESSION  06/11/2009    Right endolymphatic sac decompression and shunt  placement   HERNIA REPAIR  04/10/2008   scrotal hernia repair   LEFT HEART CATHETERIZATION WITH CORONARY ANGIOGRAM N/A 01/30/2013   Procedure: LEFT HEART CATHETERIZATION WITH CORONARY ANGIOGRAM;  Surgeon: Larey Dresser, MD;  Location: [facility] CATH LAB;  Service: Cardiovascular;  Laterality: N/A;   ROTATOR CUFF REPAIR     for chronic rotator cuff injury   Current Outpatient Medications on File Prior to Visit  Medication Sig Dispense Refill   acetaminophen (TYLENOL) 325 MG tablet Take 1-2 tablets (325-650 mg total) by mouth every 4 (four) hours as needed for mild pain.     albuterol (PROVENTIL HFA;VENTOLIN HFA) 108 (90 Base) MCG/ACT inhaler Inhale 2 puffs into the lungs every 4 (four) hours as needed for wheezing or shortness of breath. 1 Inhaler 0   atorvastatin (LIPITOR) 80 MG tablet Take 1 tablet (80 mg total) by mouth daily at 6 PM. 30 tablet 0   carbamazepine (CARBATROL) 300 MG 12 hr capsule Take 1 capsule (300 mg total) by mouth 2 (two) times daily. 30 capsule 0   ciprofloxacin (CIPRO) 500 MG tablet Take 1 tablet (500 mg total) by mouth 2 (two) times daily. 14 tablet 0   cloNIDine (CATAPRES) 0.2 MG tablet Take 1 tablet (0.2 mg total) by mouth 2 (two) times daily. 60 tablet 1   escitalopram (LEXAPRO) 5 MG tablet TAKE 1 TABLET BY MOUTH EVERYDAY AT BEDTIME 90 tablet 1   isosorbide mononitrate (IMDUR) 30 MG 24 hr tablet Take 1 tablet (30 mg total) by mouth daily. 60 tablet 1   levETIRAcetam (KEPPRA) 250 MG tablet Take 1 tablet (250 mg total) by mouth 2 (two) times daily. 60 tablet 3   levothyroxine (SYNTHROID, LEVOTHROID) 75 MCG tablet TAKE 1 TABLET BY MOUTH EVERY DAY (Patient taking differently: Take 75 mcg by mouth daily. ) 90 tablet 3   losartan (COZAAR) 100 MG tablet Take 0.5 tablets (50 mg total) by mouth 2 (two) times daily. 60 tablet 1   metFORMIN (GLUCOPHAGE) 500 MG tablet TAKE 1/2 TABLET (250 MG)  BY MOUTH 2 TIMES DAILY WITH A MEAL. 30 tablet 1   metoprolol tartrate (LOPRESSOR) 100 MG tablet Take 1 tablet (100 mg total) by mouth 2 (two) times daily. 60 tablet 1   Multiple Vitamins-Minerals (MULTIVITAMINS THER. W/MINERALS) TABS Take 1  tablet by mouth at bedtime.       warfarin (COUMADIN) 7.5 MG tablet 1&1/2 TAB TUESDAY, THURSDAY AND SATURDAY*1 TAB ON SUNDAY, MONDAY, WEDNESDAY AND FRIDAY WITH SUPPER 80 tablet 1   No current facility-administered medications on file prior to visit.    No Known Allergies Social History   Socioeconomic History   Marital status: Married    Spouse name: Not on file   Number of children: 4   Years of education: 12th   Highest education level: Not on file  Occupational History   Occupation: Retired  Scientist, product/process development strain: Not on file   Food insecurity:    Worry: Not on file    Inability: Not on file   Transportation needs:    Medical: Not on file    Non-medical: Not on file  Tobacco Use   Smoking status: Former Smoker    Years: 5.00    Types: Cigarettes    Last attempt to quit: 08/19/1956    Years since quitting: 61.9   Smokeless tobacco: Former Systems developer    Types: Chew  Substance and Sexual Activity   Alcohol use: No   Drug use: No   Sexual activity: Never  Lifestyle   Physical activity:    Days per week: Not on file    Minutes per session: Not on file   Stress: Not on file  Relationships   Social connections:    Talks on phone: Not on file    Gets together: Not on file    Attends religious service: Not on file    Active member of club or organization: Not on file    Attends meetings of clubs or organizations: Not on file    Relationship status: Not on file   Intimate partner violence:    Fear of current or ex partner: Not on file    Emotionally abused: Not on file    Physically abused: Not on file    Forced sexual activity: Not on file  Other Topics Concern   Not on file  Social History  Narrative   Lives with wife.        Review of Systems  All other systems reviewed and are negative.      Objective:   Physical Exam Vitals signs reviewed.  Cardiovascular:     Rate and Rhythm: Normal rate. Rhythm irregular.     Heart sounds: Normal heart sounds.  Pulmonary:     Effort: Pulmonary effort is normal. No respiratory distress.     Breath sounds: Normal breath sounds. No wheezing, rhonchi or rales.  Neurological:     General: No focal deficit present.     Mental Status: He is oriented to person, place, and time. Mental status is at baseline.     Cranial Nerves: No cranial nerve deficit.     Sensory: No sensory deficit.     Motor: Weakness present.     Coordination: Coordination abnormal.     Gait: Gait abnormal.  Psychiatric:        Mood and Affect: Mood normal.        Behavior: Behavior normal.        Thought Content: Thought content normal.        Judgment: Judgment normal.           Assessment & Plan:  Paroxysmal atrial fibrillation (HCC) - Plan: PT with INR/Fingerstick, PT with INR/Fingerstick  Acute cystitis without hematuria - Plan: Urine Culture  Syncope and collapse -  Plan: Cardiac event monitor   I will schedule the patient for a 30-day event monitor as recommended by Dr. Jannifer Franklin.  INR is therapeutic today however he is now coming off his Coumadin tomorrow and therefore his INR will likely drop.  Therefore of asked him to do a half a pill of his Coumadin today half a pill of his Coumadin tomorrow and then resume 1 pill a day thereafter like he was previously doing which would be 7.5 mg a day and then recheck INR in 2 weeks.  Repeat urine culture to ensure resolution of urinary tract infection

## 2018-07-18 DIAGNOSIS — I69351 Hemiplegia and hemiparesis following cerebral infarction affecting right dominant side: Secondary | ICD-10-CM | POA: Diagnosis not present

## 2018-07-18 DIAGNOSIS — I69398 Other sequelae of cerebral infarction: Secondary | ICD-10-CM | POA: Diagnosis not present

## 2018-07-18 DIAGNOSIS — G912 (Idiopathic) normal pressure hydrocephalus: Secondary | ICD-10-CM | POA: Diagnosis not present

## 2018-07-18 DIAGNOSIS — H8109 Meniere's disease, unspecified ear: Secondary | ICD-10-CM | POA: Diagnosis not present

## 2018-07-18 DIAGNOSIS — I252 Old myocardial infarction: Secondary | ICD-10-CM | POA: Diagnosis not present

## 2018-07-18 DIAGNOSIS — G40909 Epilepsy, unspecified, not intractable, without status epilepticus: Secondary | ICD-10-CM | POA: Diagnosis not present

## 2018-07-18 LAB — URINE CULTURE
MICRO NUMBER:: 423862
Result:: NO GROWTH
SPECIMEN QUALITY:: ADEQUATE

## 2018-07-19 ENCOUNTER — Telehealth: Payer: Self-pay | Admitting: *Deleted

## 2018-07-19 ENCOUNTER — Encounter: Payer: Medicare Other | Attending: Registered Nurse | Admitting: Physical Medicine & Rehabilitation

## 2018-07-19 ENCOUNTER — Other Ambulatory Visit: Payer: Self-pay

## 2018-07-19 ENCOUNTER — Encounter: Payer: Self-pay | Admitting: Physical Medicine & Rehabilitation

## 2018-07-19 VITALS — Ht 65.0 in | Wt 204.0 lb

## 2018-07-19 DIAGNOSIS — G912 (Idiopathic) normal pressure hydrocephalus: Secondary | ICD-10-CM | POA: Diagnosis not present

## 2018-07-19 NOTE — Telephone Encounter (Signed)
Please call to schedule a follow up appt. Needs to be in 3 months please.

## 2018-07-19 NOTE — Progress Notes (Signed)
Subjective:    Patient ID: [name], male    DOB: 1934-01-11, 85 y.o.   MRN: [id number]  HPI   Due to national recommendations of social distancing because of COVID 55, an audio/video tele-health visit is felt to be the most appropriate encounter for this patient at this time. See MyChart message from today for the patient's consent to a tele-health encounter with [name]. This is a follow up telephone visit for the patient who is at home. MD is at office.    I am meeting with the patient today regarding his functional deficits, NPH. He reports that [facility] PT, OT are coming up out to the house but he reports they are almost finished. He still feels a little weak, especially on the right side. He is using a walker for balance. He denies any seizures or new episodes since he's been home. His pain is minimal. He is eating well and bowel and bladder have been functionig. .    Pain Inventory Average Pain 0 Pain Right Now 0 My pain is na  In the last 24 hours, has pain interfered with the following? General activity 0 Relation with others 0 Enjoyment of life 0 What TIME of day is your pain at its worst? na Sleep (in general) Good  Pain is worse with: na Pain improves with: na Relief from Meds: na  Mobility use a walker ability to climb steps?  yes do you drive?  no  Function retired I need assistance with the following:  shopping  Neuro/Psych bladder control problems weakness trouble walking  Prior Studies Any changes since last visit?  no  Physicians involved in your care Primary care Dr. Jenna Luo   Family History  Problem Relation Age of Onset  . Heart attack Father 32  . Aneurysm Mother        brain aneurysm  . Coronary artery disease Brother        with CABG  . Diabetes Brother   . Colon cancer Neg Hx   . Colon polyps Neg Hx    Social History   Socioeconomic History  . Marital status: Married    Spouse  name: Not on file  . Number of children: 4  . Years of education: 12th  . Highest education level: Not on file  Occupational History  . Occupation: Retired  Scientific laboratory technician  . Financial resource strain: Not on file  . Food insecurity:    Worry: Not on file    Inability: Not on file  . Transportation needs:    Medical: Not on file    Non-medical: Not on file  Tobacco Use  . Smoking status: Former Smoker    Years: 5.00    Types: Cigarettes    Last attempt to quit: 08/19/1956    Years since quitting: 61.9  . Smokeless tobacco: Former Systems developer    Types: Chew  Substance and Sexual Activity  . Alcohol use: No  . Drug use: No  . Sexual activity: Never  Lifestyle  . Physical activity:    Days per week: Not on file    Minutes per session: Not on file  . Stress: Not on file  Relationships  . Social connections:    Talks on phone: Not on file    Gets together: Not on file    Attends religious service: Not on file    Active member of club or organization: Not on file    Attends meetings  of clubs or organizations: Not on file    Relationship status: Not on file  Other Topics Concern  . Not on file  Social History Narrative   Lives with wife.   Past Surgical History:  Procedure Laterality Date  . COLONOSCOPY    . CORONARY ANGIOPLASTY WITH STENT PLACEMENT  07/14/2002   Stent to the right coronary artery  . ENDOLYMPHATIC SHUNT DECOMPRESSION  06/11/2009    Right endolymphatic sac decompression and shunt  placement  . HERNIA REPAIR  04/10/2008   scrotal hernia repair  . LEFT HEART CATHETERIZATION WITH CORONARY ANGIOGRAM N/A 01/30/2013   Procedure: LEFT HEART CATHETERIZATION WITH CORONARY ANGIOGRAM;  Surgeon: Larey Dresser, MD;  Location: [facility] CATH LAB;  Service: Cardiovascular;  Laterality: N/A;  . ROTATOR CUFF REPAIR     for chronic rotator cuff injury   Past Medical History:  Diagnosis Date  . Atrial flutter ([place])   . BPH (benign prostatic hyperplasia)   . Coronary artery  disease    a. s/p MI tx with Cypher DES to pRCA in 4/04;  b. Echocardiogram 7/09: Normal LV function.  c. Nuclear study 3/13 no ischemia;  d. ETT 2/14 neg;  e. admx with CP => LHC (01/30/2013):  pLAD 40-50%, oD1 70-80%, oOM1 40%, pRCA stent patent, mid RCA 30%, EF 60-65%. => med Rx.  . DDD (degenerative disc disease)   . Diabetes mellitus without complication ([place])   . Dyslipidemia   . Gait disorder 07/13/2018  . Hemorrhoids   . Hx MRSA infection    left buttocks abscess  . Hx of echocardiogram    a. Echocardiogram (01/31/2013): Mild focal basal and mild concentric hypertrophy of the septum, EF 50-55%, normal wall motion, grade 1 diastolic dysfunction, trivial AI, MAC, mild LAE, PASP 35  . Hyperlipidemia   . Hypertension   . Hypothyroidism   . Meniere's disease    Status post shunt  . Myocardial infarction ([place]) 2008  . Occlusion and stenosis of carotid artery without mention of cerebral infarction    40-59% on carotid doppler 2014; [place] (01/2013): R 1-39%, L 60-79%  . Rotator cuff injury    chronic rotator cuff injury status post repair  . Seizure disorder ([place])   . Stroke [facility])    a. 01/2013=> post cardiac cath CVA to L post communicating artery system; R sided weakness  . Syncope    Ht _0  (1.651 m)   Wt 204 lb (92.5 kg)   BMI 33.95 kg/m   Opioid Risk Score:   Fall Risk Score:  `1  Depression screen PHQ 2/9  Depression screen [facility] 2/9 07/19/2018 02/13/2018 05/06/2017 04/14/2017 03/10/2017 12/09/2016 08/11/2015  Decreased Interest 0 0 0 0 0 0 0  Down, Depressed, Hopeless - 0 0 0 0 0 0  PHQ - 2 Score 0 0 0 0 0 0 0  Some recent data might be hidden   Review of Systems  Constitutional: Negative.   HENT: Negative.   Eyes: Negative.   Respiratory: Negative.   Cardiovascular: Negative.   Gastrointestinal: Negative.   Endocrine: Negative.   Genitourinary: Negative.   Musculoskeletal: Negative.   Skin: Negative.   Allergic/Immunologic: Negative.   Neurological: Positive for  weakness.  Psychiatric/Behavioral: Negative.   All other systems reviewed and are negative.           Assessment & Plan:  1.Gait disorder and mental status changessecondary to normal pressure hydrocephalus, prior CVA, seizure disorder             -  HH therapies continue.   -walker for safety  -might benefit from outpt therapies to improve stamina and higher level balance 2. Antithrombotics: -coumadin 3. Pain Management:tylenol prn.   4. H/o CVA/NPH: On coumadin.  9. Paroxymal Afib: neurology now working pt up for syncope with 30 day monitor??? 10. HTN:   per primary 11. H/O seizures: On tegretol BID.  -keppra added by neurology over concern of seizure as a source of "syncopal" episodes 12. T2DM:    13. CAD: On Lipitor, Imdur and metoprolol.    11 minutes of tele-visit time was spent with this patient today. Follow up in 6 weeks

## 2018-07-20 NOTE — Telephone Encounter (Signed)
I contacted the pt and scheduled him for his 3 month f/u. Appt date 10/17/18 at 12 pm.

## 2018-07-21 ENCOUNTER — Ambulatory Visit: Payer: Medicare Other | Admitting: Neurology

## 2018-07-25 ENCOUNTER — Telehealth: Payer: Self-pay | Admitting: Radiology

## 2018-07-25 DIAGNOSIS — G40909 Epilepsy, unspecified, not intractable, without status epilepticus: Secondary | ICD-10-CM | POA: Diagnosis not present

## 2018-07-25 DIAGNOSIS — G912 (Idiopathic) normal pressure hydrocephalus: Secondary | ICD-10-CM | POA: Diagnosis not present

## 2018-07-25 DIAGNOSIS — I69398 Other sequelae of cerebral infarction: Secondary | ICD-10-CM | POA: Diagnosis not present

## 2018-07-25 DIAGNOSIS — I69351 Hemiplegia and hemiparesis following cerebral infarction affecting right dominant side: Secondary | ICD-10-CM | POA: Diagnosis not present

## 2018-07-25 DIAGNOSIS — I252 Old myocardial infarction: Secondary | ICD-10-CM | POA: Diagnosis not present

## 2018-07-25 DIAGNOSIS — H8109 Meniere's disease, unspecified ear: Secondary | ICD-10-CM | POA: Diagnosis not present

## 2018-07-25 NOTE — Telephone Encounter (Signed)
Enrolled patient for a 30 day [employer] Cardiac Event Monitor to be mailed due to Covid-19. I went over brief instructions with patient and he knows to expect the monitor to arrive in 3-4 days.  * assigned Hochrein to read patient has seen him in the past

## 2018-07-31 ENCOUNTER — Encounter: Payer: Self-pay | Admitting: Family Medicine

## 2018-07-31 ENCOUNTER — Other Ambulatory Visit: Payer: Self-pay

## 2018-07-31 ENCOUNTER — Ambulatory Visit (INDEPENDENT_AMBULATORY_CARE_PROVIDER_SITE_OTHER): Payer: Medicare Other | Admitting: Family Medicine

## 2018-07-31 VITALS — BP 120/60 | HR 50 | Temp 98.5°F | Resp 18 | Ht 65.0 in | Wt 212.0 lb

## 2018-07-31 DIAGNOSIS — I480 Paroxysmal atrial fibrillation: Secondary | ICD-10-CM

## 2018-07-31 NOTE — Progress Notes (Signed)
Subjective:    Patient ID: [name], male    DOB: 07-18-1933, 85 y.o.   MRN: [id number]  HPI Patient was recently admitted to the hospital in March.  I have copied relevant portions of the discharge summary and included them below for my reference: Admit date: 06/01/2018 Discharge date: 06/15/2018  Discharge Diagnoses:  Principal Problem:   Normal pressure hydrocephalus ([place]) Active Problems:   HTN (hypertension)   Controlled type 2 diabetes mellitus with complication, without long-term current use of insulin (HCC)   Seizure disorder (HCC)   E. coli UTI   Leucocytosis   Discharged Condition: stable   Significant Diagnostic Studies: N/A   Labs:  Basic Metabolic Panel: BMP Latest Ref Rng & Units 06/14/2018 06/07/2018 06/02/2018  Glucose 70 - 99 mg/dL 110(H) 119(H) 127(H)  BUN 8 - 23 mg/dL 25(H) 15 19  Creatinine 0.61 - 1.24 mg/dL 1.11 1.03 1.18  Sodium 135 - 145 mmol/L 136 135 136  Potassium 3.5 - 5.1 mmol/L 4.2 4.0 4.1  Chloride 98 - 111 mmol/L 101 104 100  CO2 22 - 32 mmol/L _0 Calcium 8.9 - 10.3 mg/dL 9.2 8.4(L) 9.0    CBC: CBC Latest Ref Rng & Units 06/13/2018 06/07/2018 06/05/2018  WBC 4.0 - 10.5 K/uL 12.8(H) 11.9(H) 15.8(H)  Hemoglobin 13.0 - 17.0 g/dL 11.4(L) 12.1(L) 11.7(L)  Hematocrit 39.0 - 52.0 % 35.7(L) 36.6(L) 37.3(L)  Platelets 150 - 400 K/uL 338 375 300    CBG: LastLabs         Recent Labs  Lab 06/14/18 0616 06/14/18 1159 06/14/18 1644 06/14/18 2127 06/15/18 0622  GLUCAP 98 114* 113* 95 108*      Brief HPI:   [name] is an 85 year old male with history of CVA with residual R-HP/facial droop, seizures, T2DM, A flutter- on coumadin, CAD, recent bronchitis flare who was admitted via ED on 05/30/18 with syncope, mental status changes, abnormal chewing movements as well as eye fluttering. Family also reported issues with urinary incontinence, shuffling gait and cognitive decline in the past year.   He was treated with  IVF but continued to have waxing and waning of mental status. EEG without epileptiform activity.  MRI brain revealed findings of mild chronic communicating hydrocephalus and neurology recommends follow up at [facility] clinic at [facility] for work up.    Hospital Course: Torell Minder Lusty was admitted to rehab 06/01/2018 for inpatient therapies to consist of PT, ST and OT at least three hours five days a week. Past admission physiatrist, therapy team and rehab RN have worked together to provide customized collaborative inpatient rehab. He has been seizure free on tegretol bid and repeat levels were therapeutic.  He was found to have leucocytosis due to E coli UTI and was treated with Keflex X 5 days. Follow up CBC showed that leucocytosis is resolving. Serial check of lytes showed pre-renal azotemia and he has been encouraged to increase fluid intake.   Blood pressures have been monitored on bid basis and catapres was resumed for better control. Heart rate has been monitored on bid basis and is controlled on home dose metoprolol. Pharmacy has been assisting with dosing and management of coumadin. INR is therapeutic at discharge and he is to continue 11.25 mg on T,T,Sa and take 7.5 mg all other days. HHRN to draw protime on 3/30 and call results to primary MD. Patient with resultant cognitive deficits with confabulatory speech, word findings deficits, difficulty with processing and balance deficits affecting ADLs  and mobility. CIR recommended due to functional decline.   Rehab course: During patient's stay in rehab weekly team conferences were held to monitor patient's progress, set goals and discuss barriers to discharge. At admission, patient required mod assist with basic self care tasks and with mobility  He exhibited mild cognitive impairments in naming and moderate impairments in recall and orientation.  He has had improvement in activity tolerance, balance, postural control as well as ability to compensate for  deficits. He is able to complete ADL tasks with supervision. He requires supervision for transfers and to ambulate 150' with RW.  He requires supervision for complex problem solving and min verbal cues to recall daily and functional information. Family education completed regarding assistance and need for supervision after discharge.   07/11/18 Patient presents today with his daughter.  Yesterday he had what appears to be another seizure-like episode.  He was sitting at his chair eating dinner.  Suddenly became unresponsive.  His muscles became very stiff.  His son was unable to get him to respond to questions.  He no recollection.  His daughter states that he was stiff all over similar to a seizure.  There was no shaking.  Episode lasted 2 minutes and that he gradually regained consciousness.  Story sounds like a clonic seizure.  He has a history of seizures and is on Tegretol for this.  He is concerned that he may have a bladder infection.  Urinalysis reveals +3 blood, positive leukocyte esterase, positive nitrites.  He reports urinary frequency and dysuria.  He is currently on Coumadin and his last INR was approximately 2 on April 9.  At that time, my plan was: Patient appears to have a urinary back infection and likely prostatitis.  I will treat him with Cipro twice daily for 7 days.  Decrease Coumadin dose to 1/2 tablet on Tuesday and Friday and 1 tablet all other days.  Recheck here on Monday of next week to recheck INR as Coumadin is affected by Cipro which increases its anticoagulant effect.  I question if the urinary tract infection lowered her seizure threshold and trigger the clonic seizure.  When the patient returns I will check a Tegretol level.  If patient continues to have seizure spells, we will need to possibly add a second antiepileptic medication.  07/17/18 Patient is here today for follow-up and recheck his INR.  I reviewed his recent office visit with his neurologist, Dr. Jannifer Franklin.  Dr.  Jannifer Franklin has recommended a cardiac monitor to evaluate for causes of syncope.  However, he also added Keppra.  Therefore I would not check his carbamazepine level since he is now on 2 antiepileptics.  His TSH was normal in March.  His hemoglobin A1c was 6.7 in March.  I would recheck these again in June.  He completes his Cipro tomorrow for his urinary tract infection.  His INR is therapeutic at 3.0.  At that time, my plan was: I will schedule the patient for a 30-day event monitor as recommended by Dr. Jannifer Franklin.  INR is therapeutic today however he is now coming off his antibiotic tomorrow and therefore his INR will likely drop.  Therefore of asked him to do a half a pill of his Coumadin today half a pill of his Coumadin tomorrow and then resume 1 pill a day thereafter like he was previously doing which would be 7.5 mg a day and then recheck INR in 2 weeks.  Repeat urine culture to ensure resolution of urinary tract infection.  07/31/18 Here today to recheck his Coumadin.  He is here today with his son.  He is unaware if he is taking his medication.  His daughter has been getting all of his pills and putting them in a pillbox.  He is taking his pills out of the pillbox due to memory issues.  However his INR today is 1.2.  We rechecked his INR to make sure and it again returned at 1.2.  The patient has previously been taking Coumadin 7.5 mg daily and having therapeutic range INR.  This is very unusual for him as it has never been this off in the past.  This makes me question if he is actually been taking his Coumadin pill or if the medication is correct.  His INR today is consistent with someone who is not currently taking Coumadin.  I explained this to both the patient and his son.  Prior to making any changes in his dose I have recommended that they go home and verify that the Coumadin pill is actually in his pillbox.  If the pill is actually in his pillbox I also want him to verify with the pharmacy that this is  the correct pill.  Is at the correct size and shape and color.  If it is in fact confirmed that he has been taking Coumadin 7.5 mg daily and they are certain that is the correct pill, I would increase the medication considerably and put him on 2 pills Monday Wednesday Friday which would equal 15 mg and 1 pill on Tuesday Thursday Saturday and Sunday and then recheck an INR in 10 days. Past Medical History:  Diagnosis Date   Atrial flutter ([place])    BPH (benign prostatic hyperplasia)    Coronary artery disease    a. s/p MI tx with Cypher DES to pRCA in 4/04;  b. Echocardiogram 7/09: Normal LV function.  c. Nuclear study 3/13 no ischemia;  d. ETT 2/14 neg;  e. admx with CP => LHC (01/30/2013):  pLAD 40-50%, oD1 70-80%, oOM1 40%, pRCA stent patent, mid RCA 30%, EF 60-65%. => med Rx.   DDD (degenerative disc disease)    Diabetes mellitus without complication ([place])    Dyslipidemia    Gait disorder 07/13/2018   Hemorrhoids    Hx MRSA infection    left buttocks abscess   Hx of echocardiogram    a. Echocardiogram (01/31/2013): Mild focal basal and mild concentric hypertrophy of the septum, EF 50-55%, normal wall motion, grade 1 diastolic dysfunction, trivial AI, MAC, mild LAE, PASP 35   Hyperlipidemia    Hypertension    Hypothyroidism    Meniere's disease    Status post shunt   Myocardial infarction [facility]) 2008   Occlusion and stenosis of carotid artery without mention of cerebral infarction    40-59% on carotid doppler 2014; [place] (01/2013): R 1-39%, L 60-79%   Rotator cuff injury    chronic rotator cuff injury status post repair   Seizure disorder ([place])    Stroke ([place])    a. 01/2013=> post cardiac cath CVA to L post communicating artery system; R sided weakness   Syncope    Past Surgical History:  Procedure Laterality Date   COLONOSCOPY     CORONARY ANGIOPLASTY WITH STENT PLACEMENT  07/14/2002   Stent to the right coronary artery   ENDOLYMPHATIC SHUNT DECOMPRESSION   06/11/2009    Right endolymphatic sac decompression and shunt  placement   HERNIA REPAIR  04/10/2008   scrotal hernia repair  LEFT HEART CATHETERIZATION WITH CORONARY ANGIOGRAM N/A 01/30/2013   Procedure: LEFT HEART CATHETERIZATION WITH CORONARY ANGIOGRAM;  Surgeon: Larey Dresser, MD;  Location[facility] CATH LAB;  Service: Cardiovascular;  Laterality: N/A;   ROTATOR CUFF REPAIR     for chronic rotator cuff injury   Current Outpatient Medications on File Prior to Visit  Medication Sig Dispense Refill   acetaminophen (TYLENOL) 325 MG tablet Take 1-2 tablets (325-650 mg total) by mouth every 4 (four) hours as needed for mild pain.     albuterol (PROVENTIL HFA;VENTOLIN HFA) 108 (90 Base) MCG/ACT inhaler Inhale 2 puffs into the lungs every 4 (four) hours as needed for wheezing or shortness of breath. 1 Inhaler 0   atorvastatin (LIPITOR) 80 MG tablet Take 1 tablet (80 mg total) by mouth daily at 6 PM. 90 tablet 1   carbamazepine (CARBATROL) 300 MG 12 hr capsule Take 1 capsule (300 mg total) by mouth 2 (two) times daily. 30 capsule 0   cloNIDine (CATAPRES) 0.2 MG tablet Take 1 tablet (0.2 mg total) by mouth 2 (two) times daily. 60 tablet 1   escitalopram (LEXAPRO) 5 MG tablet TAKE 1 TABLET BY MOUTH EVERYDAY AT BEDTIME 90 tablet 1   isosorbide mononitrate (IMDUR) 30 MG 24 hr tablet Take 1 tablet (30 mg total) by mouth daily. 60 tablet 1   levETIRAcetam (KEPPRA) 250 MG tablet Take 1 tablet (250 mg total) by mouth 2 (two) times daily. 60 tablet 3   levothyroxine (SYNTHROID, LEVOTHROID) 75 MCG tablet TAKE 1 TABLET BY MOUTH EVERY DAY (Patient taking differently: Take 75 mcg by mouth daily. ) 90 tablet 3   losartan (COZAAR) 100 MG tablet Take 0.5 tablets (50 mg total) by mouth 2 (two) times daily. 60 tablet 1   metFORMIN (GLUCOPHAGE) 500 MG tablet TAKE 1/2 TABLET (250 MG) BY MOUTH 2 TIMES DAILY WITH A MEAL. 30 tablet 1   metoprolol tartrate (LOPRESSOR) 100 MG tablet Take 1 tablet (100 mg total)  by mouth 2 (two) times daily. 60 tablet 1   Multiple Vitamins-Minerals (MULTIVITAMINS THER. W/MINERALS) TABS Take 1 tablet by mouth at bedtime.       warfarin (COUMADIN) 7.5 MG tablet 1&1/2 TAB TUESDAY, THURSDAY AND SATURDAY*1 TAB ON SUNDAY, MONDAY, WEDNESDAY AND FRIDAY WITH SUPPER (Patient taking differently: Take 7.5 mg by mouth daily. ) 80 tablet 1   No current facility-administered medications on file prior to visit.    No Known Allergies Social History   Socioeconomic History   Marital status: Married    Spouse name: Not on file   Number of children: 4   Years of education: 12th   Highest education level: Not on file  Occupational History   Occupation: Retired  Scientist, product/process development strain: Not on file   Food insecurity:    Worry: Not on file    Inability: Not on file   Transportation needs:    Medical: Not on file    Non-medical: Not on file  Tobacco Use   Smoking status: Former Smoker    Years: 5.00    Types: Cigarettes    Last attempt to quit: 08/19/1956    Years since quitting: 61.9   Smokeless tobacco: Former Systems developer    Types: Chew  Substance and Sexual Activity   Alcohol use: No   Drug use: No   Sexual activity: Never  Lifestyle   Physical activity:    Days per week: Not on file    Minutes per session: Not on  file   Stress: Not on file  Relationships   Social connections:    Talks on phone: Not on file    Gets together: Not on file    Attends religious service: Not on file    Active member of club or organization: Not on file    Attends meetings of clubs or organizations: Not on file    Relationship status: Not on file   Intimate partner violence:    Fear of current or ex partner: Not on file    Emotionally abused: Not on file    Physically abused: Not on file    Forced sexual activity: Not on file  Other Topics Concern   Not on file  Social History Narrative   Lives with wife.        Review of Systems  All  other systems reviewed and are negative.      Objective:   Physical Exam Vitals signs reviewed.  Cardiovascular:     Rate and Rhythm: Normal rate. Rhythm irregular.     Heart sounds: Normal heart sounds.  Pulmonary:     Effort: Pulmonary effort is normal. No respiratory distress.     Breath sounds: Normal breath sounds. No wheezing, rhonchi or rales.  Neurological:     General: No focal deficit present.     Mental Status: He is oriented to person, place, and time. Mental status is at baseline.     Cranial Nerves: No cranial nerve deficit.     Sensory: No sensory deficit.     Motor: Weakness present.     Coordination: Coordination abnormal.     Gait: Gait abnormal.  Psychiatric:        Mood and Affect: Mood normal.        Behavior: Behavior normal.        Thought Content: Thought content normal.        Judgment: Judgment normal.           Assessment & Plan:  Paroxysmal atrial fibrillation (HCC)  His INR today is consistent with someone who is not currently taking Coumadin.  I explained this to both the patient and his son.  Prior to making any changes in his dose I have recommended that they go home and verify that the Coumadin pill is actually in his pillbox.  If the pill is actually in his pillbox I also want him to verify with the pharmacy that this is the correct pill.  Is at the correct size and shape and color.  If it is in fact confirmed that he has been taking Coumadin 7.5 mg daily and they are certain that is the correct pill, I would increase the medication considerably and put him on 2 pills Monday Wednesday Friday which would equal 15 mg and 1 pill on Tuesday Thursday Saturday and Sunday and then recheck an INR in 10 days.

## 2018-08-01 ENCOUNTER — Telehealth: Payer: Self-pay | Admitting: Family Medicine

## 2018-08-01 ENCOUNTER — Ambulatory Visit (INDEPENDENT_AMBULATORY_CARE_PROVIDER_SITE_OTHER): Payer: Medicare Other

## 2018-08-01 DIAGNOSIS — R55 Syncope and collapse: Secondary | ICD-10-CM | POA: Diagnosis not present

## 2018-08-01 NOTE — Telephone Encounter (Signed)
York Ram called and states that pt is taking 1 pill per day - pills are 7.5 mg.  Per [name] take 1.5 tabs po qd and recheck INR in 1 week.   [place]

## 2018-08-06 ENCOUNTER — Other Ambulatory Visit: Payer: Self-pay | Admitting: Family Medicine

## 2018-08-08 ENCOUNTER — Encounter: Payer: Self-pay | Admitting: Family Medicine

## 2018-08-08 ENCOUNTER — Ambulatory Visit (INDEPENDENT_AMBULATORY_CARE_PROVIDER_SITE_OTHER): Payer: Medicare Other | Admitting: Family Medicine

## 2018-08-08 ENCOUNTER — Other Ambulatory Visit: Payer: Self-pay

## 2018-08-08 DIAGNOSIS — I480 Paroxysmal atrial fibrillation: Secondary | ICD-10-CM

## 2018-08-08 LAB — PT WITH INR/FINGERSTICK
INR, fingerstick: 2.9 ratio — ABNORMAL HIGH
PT, fingerstick: 35.3 s — ABNORMAL HIGH (ref 10.5–13.1)

## 2018-08-08 NOTE — Progress Notes (Signed)
Subjective:    Patient ID: ANTWON ROCHIN, male    DOB: 06/21/1933, 85 y.o.   MRN: [id number]  HPI Patient was recently admitted to the hospital in March.  I have copied relevant portions of the discharge summary and included them below for my reference: Admit date: 06/01/2018 Discharge date: 06/15/2018  Discharge Diagnoses:  Principal Problem:   Normal pressure hydrocephalus ([place]) Active Problems:   HTN (hypertension)   Controlled type 2 diabetes mellitus with complication, without long-term current use of insulin (HCC)   Seizure disorder (HCC)   E. coli UTI   Leucocytosis   Discharged Condition: stable   Significant Diagnostic Studies: N/A   Labs:  Basic Metabolic Panel: BMP Latest Ref Rng & Units 06/14/2018 06/07/2018 06/02/2018  Glucose 70 - 99 mg/dL 110(H) 119(H) 127(H)  BUN 8 - 23 mg/dL 25(H) 15 19  Creatinine 0.61 - 1.24 mg/dL 1.11 1.03 1.18  Sodium 135 - 145 mmol/L 136 135 136  Potassium 3.5 - 5.1 mmol/L 4.2 4.0 4.1  Chloride 98 - 111 mmol/L 101 104 100  CO2 22 - 32 mmol/L _0 Calcium 8.9 - 10.3 mg/dL 9.2 8.4(L) 9.0    CBC: CBC Latest Ref Rng & Units 06/13/2018 06/07/2018 06/05/2018  WBC 4.0 - 10.5 K/uL 12.8(H) 11.9(H) 15.8(H)  Hemoglobin 13.0 - 17.0 g/dL 11.4(L) 12.1(L) 11.7(L)  Hematocrit 39.0 - 52.0 % 35.7(L) 36.6(L) 37.3(L)  Platelets 150 - 400 K/uL 338 375 300    CBG: LastLabs         Recent Labs  Lab 06/14/18 0616 06/14/18 1159 06/14/18 1644 06/14/18 2127 06/15/18 0622  GLUCAP 98 114* 113* 95 108*      Brief HPI:   Encarnacion Scioneaux is an 85 year old male with history of CVA with residual R-HP/facial droop, seizures, T2DM, A flutter- on coumadin, CAD, recent bronchitis flare who was admitted via ED on 05/30/18 with syncope, mental status changes, abnormal chewing movements as well as eye fluttering. Family also reported issues with urinary incontinence, shuffling gait and cognitive decline in the past year.   He was treated with  IVF but continued to have waxing and waning of mental status. EEG without epileptiform activity.  MRI brain revealed findings of mild chronic communicating hydrocephalus and neurology recommends follow up at [facility] clinic at [facility] for work up.    Hospital Course: Aeson Sawyers Bench was admitted to rehab 06/01/2018 for inpatient therapies to consist of PT, ST and OT at least three hours five days a week. Past admission physiatrist, therapy team and rehab RN have worked together to provide customized collaborative inpatient rehab. He has been seizure free on tegretol bid and repeat levels were therapeutic.  He was found to have leucocytosis due to E coli UTI and was treated with Keflex X 5 days. Follow up CBC showed that leucocytosis is resolving. Serial check of lytes showed pre-renal azotemia and he has been encouraged to increase fluid intake.   Blood pressures have been monitored on bid basis and catapres was resumed for better control. Heart rate has been monitored on bid basis and is controlled on home dose metoprolol. Pharmacy has been assisting with dosing and management of coumadin. INR is therapeutic at discharge and he is to continue 11.25 mg on T,T,Sa and take 7.5 mg all other days. HHRN to draw protime on 3/30 and call results to primary MD. Patient with resultant cognitive deficits with confabulatory speech, word findings deficits, difficulty with processing and balance deficits affecting ADLs  and mobility. CIR recommended due to functional decline.   Rehab course: During patient's stay in rehab weekly team conferences were held to monitor patient's progress, set goals and discuss barriers to discharge. At admission, patient required mod assist with basic self care tasks and with mobility  He exhibited mild cognitive impairments in naming and moderate impairments in recall and orientation.  He has had improvement in activity tolerance, balance, postural control as well as ability to compensate for  deficits. He is able to complete ADL tasks with supervision. He requires supervision for transfers and to ambulate 150' with RW.  He requires supervision for complex problem solving and min verbal cues to recall daily and functional information. Family education completed regarding assistance and need for supervision after discharge.   07/11/18 Patient presents today with his daughter.  Yesterday he had what appears to be another seizure-like episode.  He was sitting at his chair eating dinner.  Suddenly became unresponsive.  His muscles became very stiff.  His son was unable to get him to respond to questions.  He no recollection.  His daughter states that he was stiff all over similar to a seizure.  There was no shaking.  Episode lasted 2 minutes and that he gradually regained consciousness.  Story sounds like a clonic seizure.  He has a history of seizures and is on Tegretol for this.  He is concerned that he may have a bladder infection.  Urinalysis reveals +3 blood, positive leukocyte esterase, positive nitrites.  He reports urinary frequency and dysuria.  He is currently on Coumadin and his last INR was approximately 2 on April 9.  At that time, my plan was: Patient appears to have a urinary back infection and likely prostatitis.  I will treat him with Cipro twice daily for 7 days.  Decrease Coumadin dose to 1/2 tablet on Tuesday and Friday and 1 tablet all other days.  Recheck here on Monday of next week to recheck INR as Coumadin is affected by Cipro which increases its anticoagulant effect.  I question if the urinary tract infection lowered her seizure threshold and trigger the clonic seizure.  When the patient returns I will check a Tegretol level.  If patient continues to have seizure spells, we will need to possibly add a second antiepileptic medication.  07/17/18 Patient is here today for follow-up and recheck his INR.  I reviewed his recent office visit with his neurologist, Dr. Jannifer Franklin.  Dr.  Jannifer Franklin has recommended a cardiac monitor to evaluate for causes of syncope.  However, he also added Keppra.  Therefore I would not check his carbamazepine level since he is now on 2 antiepileptics.  His TSH was normal in March.  His hemoglobin A1c was 6.7 in March.  I would recheck these again in June.  He completes his Cipro tomorrow for his urinary tract infection.  His INR is therapeutic at 3.0.  At that time, my plan was: I will schedule the patient for a 30-day event monitor as recommended by Dr. Jannifer Franklin.  INR is therapeutic today however he is now coming off his antibiotic tomorrow and therefore his INR will likely drop.  Therefore of asked him to do a half a pill of his Coumadin today half a pill of his Coumadin tomorrow and then resume 1 pill a day thereafter like he was previously doing which would be 7.5 mg a day and then recheck INR in 2 weeks.  Repeat urine culture to ensure resolution of urinary tract infection.  07/31/18 Here today to recheck his Coumadin.  He is here today with his son.  He is unaware if he is taking his medication.  His daughter has been getting all of his pills and putting them in a pillbox.  He is taking his pills out of the pillbox due to memory issues.  However his INR today is 1.2.  We rechecked his INR to make sure and it again returned at 1.2.  The patient has previously been taking Coumadin 7.5 mg daily and having therapeutic range INR.  This is very unusual for him as it has never been this off in the past.  This makes me question if he is actually been taking his Coumadin pill or if the medication is correct.  His INR today is consistent with someone who is not currently taking Coumadin.  I explained this to both the patient and his son.  Prior to making any changes in his dose I have recommended that they go home and verify that the Coumadin pill is actually in his pillbox.  If the pill is actually in his pillbox I also want him to verify with the pharmacy that this is  the correct pill.  Is at the correct size and shape and color.  If it is in fact confirmed that he has been taking Coumadin 7.5 mg daily and they are certain that is the correct pill, I would increase the medication considerably and put him on 2 pills Monday Wednesday Friday which would equal 15 mg and 1 pill on Tuesday Thursday Saturday and Sunday and then recheck an INR in 10 days.  At that time, my plan was: His INR today is consistent with someone who is not currently taking Coumadin.  I explained this to both the patient and his son.  Prior to making any changes in his dose I have recommended that they go home and verify that the Coumadin pill is actually in his pillbox.  If the pill is actually in his pillbox I also want him to verify with the pharmacy that this is the correct pill.  Is at the correct size and shape and color.  If it is in fact confirmed that he has been taking Coumadin 7.5 mg daily and they are certain that is the correct pill, I would increase the medication considerably and put him on 2 pills Monday Wednesday Friday which would equal 15 mg and 1 pill on Tuesday Thursday Saturday and Sunday and then recheck an INR in 10 days.  08/08/18 Patient's family called back the next day.  He was in fact taking his Coumadin 7.5 mg daily.  Therefore we increased his Coumadin dose to 1-1/2 pills/day and recheck that 1 week later.  Therefore he is taking 10.75 mg of Coumadin daily.  His INR today is therapeutic at 2.9.  He denies any bleeding or bruising.  He denies any missed doses.  He denies any medication changes Past Medical History:  Diagnosis Date   Atrial flutter (HCC)    BPH (benign prostatic hyperplasia)    Coronary artery disease    a. s/p MI tx with Cypher DES to pRCA in 4/04;  b. Echocardiogram 7/09: Normal LV function.  c. Nuclear study 3/13 no ischemia;  d. ETT 2/14 neg;  e. admx with CP => LHC (01/30/2013):  pLAD 40-50%, oD1 70-80%, oOM1 40%, pRCA stent patent, mid RCA 30%, EF  60-65%. => med Rx.   DDD (degenerative disc disease)    Diabetes mellitus  without complication ([place])    Dyslipidemia    Gait disorder 07/13/2018   Hemorrhoids    Hx MRSA infection    left buttocks abscess   Hx of echocardiogram    a. Echocardiogram (01/31/2013): Mild focal basal and mild concentric hypertrophy of the septum, EF 50-55%, normal wall motion, grade 1 diastolic dysfunction, trivial AI, MAC, mild LAE, PASP 35   Hyperlipidemia    Hypertension    Hypothyroidism    Meniere's disease    Status post shunt   Myocardial infarction [facility]) 2008   Occlusion and stenosis of carotid artery without mention of cerebral infarction    40-59% on carotid doppler 2014; [place] (01/2013): R 1-39%, L 60-79%   Rotator cuff injury    chronic rotator cuff injury status post repair   Seizure disorder ([place])    Stroke ([place])    a. 01/2013=> post cardiac cath CVA to L post communicating artery system; R sided weakness   Syncope    Past Surgical History:  Procedure Laterality Date   COLONOSCOPY     CORONARY ANGIOPLASTY WITH STENT PLACEMENT  07/14/2002   Stent to the right coronary artery   ENDOLYMPHATIC SHUNT DECOMPRESSION  06/11/2009    Right endolymphatic sac decompression and shunt  placement   HERNIA REPAIR  04/10/2008   scrotal hernia repair   LEFT HEART CATHETERIZATION WITH CORONARY ANGIOGRAM N/A 01/30/2013   Procedure: LEFT HEART CATHETERIZATION WITH CORONARY ANGIOGRAM;  Surgeon: Larey Dresser, MD;  Location: [facility] CATH LAB;  Service: Cardiovascular;  Laterality: N/A;   ROTATOR CUFF REPAIR     for chronic rotator cuff injury   Current Outpatient Medications on File Prior to Visit  Medication Sig Dispense Refill   acetaminophen (TYLENOL) 325 MG tablet Take 1-2 tablets (325-650 mg total) by mouth every 4 (four) hours as needed for mild pain.     albuterol (PROVENTIL HFA;VENTOLIN HFA) 108 (90 Base) MCG/ACT inhaler Inhale 2 puffs into the lungs every 4 (four) hours as  needed for wheezing or shortness of breath. 1 Inhaler 0   atorvastatin (LIPITOR) 80 MG tablet Take 1 tablet (80 mg total) by mouth daily at 6 PM. 90 tablet 1   carbamazepine (CARBATROL) 300 MG 12 hr capsule Take 1 capsule (300 mg total) by mouth 2 (two) times daily. 30 capsule 0   cloNIDine (CATAPRES) 0.2 MG tablet Take 1 tablet (0.2 mg total) by mouth 2 (two) times daily. 60 tablet 1   escitalopram (LEXAPRO) 5 MG tablet TAKE 1 TABLET BY MOUTH EVERYDAY AT BEDTIME 90 tablet 1   isosorbide mononitrate (IMDUR) 30 MG 24 hr tablet Take 1 tablet (30 mg total) by mouth daily. 60 tablet 1   levETIRAcetam (KEPPRA) 250 MG tablet Take 1 tablet (250 mg total) by mouth 2 (two) times daily. 60 tablet 3   levothyroxine (SYNTHROID, LEVOTHROID) 75 MCG tablet TAKE 1 TABLET BY MOUTH EVERY DAY (Patient taking differently: Take 75 mcg by mouth daily. ) 90 tablet 3   losartan (COZAAR) 100 MG tablet Take 0.5 tablets (50 mg total) by mouth 2 (two) times daily. 60 tablet 1   metFORMIN (GLUCOPHAGE) 500 MG tablet TAKE 1/2 TABLET (250 MG) BY MOUTH 2 TIMES DAILY WITH A MEAL. 30 tablet 5   metoprolol tartrate (LOPRESSOR) 100 MG tablet Take 1 tablet (100 mg total) by mouth 2 (two) times daily. 60 tablet 1   Multiple Vitamins-Minerals (MULTIVITAMINS THER. W/MINERALS) TABS Take 1 tablet by mouth at bedtime.       warfarin (COUMADIN)  7.5 MG tablet 1&1/2 TAB TUESDAY, THURSDAY AND SATURDAY*1 TAB ON SUNDAY, MONDAY, WEDNESDAY AND FRIDAY WITH SUPPER (Patient taking differently: 1&1/2 TAB QD) 80 tablet 1   No current facility-administered medications on file prior to visit.    No Known Allergies Social History   Socioeconomic History   Marital status: Married    Spouse name: Not on file   Number of children: 4   Years of education: 12th   Highest education level: Not on file  Occupational History   Occupation: Retired  Scientist, product/process development strain: Not on file   Food insecurity:    Worry: Not on  file    Inability: Not on file   Transportation needs:    Medical: Not on file    Non-medical: Not on file  Tobacco Use   Smoking status: Former Smoker    Years: 5.00    Types: Cigarettes    Last attempt to quit: 08/19/1956    Years since quitting: 62.0   Smokeless tobacco: Former Systems developer    Types: Chew  Substance and Sexual Activity   Alcohol use: No   Drug use: No   Sexual activity: Never  Lifestyle   Physical activity:    Days per week: Not on file    Minutes per session: Not on file   Stress: Not on file  Relationships   Social connections:    Talks on phone: Not on file    Gets together: Not on file    Attends religious service: Not on file    Active member of club or organization: Not on file    Attends meetings of clubs or organizations: Not on file    Relationship status: Not on file   Intimate partner violence:    Fear of current or ex partner: Not on file    Emotionally abused: Not on file    Physically abused: Not on file    Forced sexual activity: Not on file  Other Topics Concern   Not on file  Social History Narrative   Lives with wife.        Review of Systems  All other systems reviewed and are negative.      Objective:   Physical Exam Vitals signs reviewed.  Cardiovascular:     Rate and Rhythm: Normal rate. Rhythm irregular.     Heart sounds: Normal heart sounds.  Pulmonary:     Effort: Pulmonary effort is normal. No respiratory distress.     Breath sounds: Normal breath sounds. No wheezing, rhonchi or rales.  Neurological:     General: No focal deficit present.     Mental Status: He is oriented to person, place, and time. Mental status is at baseline.     Cranial Nerves: No cranial nerve deficit.     Sensory: No sensory deficit.     Motor: Weakness present.     Coordination: Coordination abnormal.     Gait: Gait abnormal.  Psychiatric:        Mood and Affect: Mood normal.        Behavior: Behavior normal.        Thought  Content: Thought content normal.        Judgment: Judgment normal.           Assessment & Plan:  Paroxysmal atrial fibrillation ([place]) - Plan: PT with INR/Fingerstick  INR today is therapeutic at 2.9.  Continue Coumadin 1-1/2 tablets every day.  This equates to 10.75 mg of Coumadin daily.  Recheck INR in 2 weeks given the recent change and if still therapeutic in 2 weeks we will space out to every 4 to 6 weeks.  We also discussed switching the patient over to Eliquis for safety and convenience.  The daughter will check on the price and notify me via telephone if they desire to switch.

## 2018-08-12 ENCOUNTER — Telehealth: Payer: Self-pay | Admitting: Cardiology

## 2018-08-12 NOTE — Telephone Encounter (Signed)
Received a page from [facility] regarding a critical ECG. Report at 04:22 AM central time, patient had atrial flutter at that time at 50 bpm. [facility] called the patient who reported being at home sleeping and without symptoms at that time. Most recent ECG with atrial flutter in the 70s. Patient with documented history of atrial fibrillation, on chronic anticoagulation.  Bryna Colander, MD

## 2018-08-15 ENCOUNTER — Other Ambulatory Visit: Payer: Self-pay | Admitting: Family Medicine

## 2018-08-16 ENCOUNTER — Telehealth: Payer: Self-pay | Admitting: Family Medicine

## 2018-08-16 MED ORDER — WARFARIN SODIUM 7.5 MG PO TABS: ORAL_TABLET | ORAL | 1 refills | Status: DC

## 2018-08-16 NOTE — Telephone Encounter (Signed)
[facility] called and said they would like to switch pt to Eliquis. Please advise.

## 2018-08-16 NOTE — Addendum Note (Signed)
Addended by: Shary Decamp B on: 08/16/2018 10:18 AM   Modules accepted: Orders

## 2018-08-17 MED ORDER — APIXABAN 5 MG PO TABS: 5.0000 mg | ORAL_TABLET | Freq: Two times a day (BID) | ORAL | 5 refills | Status: DC

## 2018-08-17 NOTE — Telephone Encounter (Signed)
[facility] aware and med sent to [facility]

## 2018-08-17 NOTE — Telephone Encounter (Signed)
Stop coumadin and start eliquis 5 mg pobid the next day

## 2018-08-18 ENCOUNTER — Other Ambulatory Visit: Payer: Self-pay | Admitting: Family Medicine

## 2018-08-18 MED ORDER — ISOSORBIDE MONONITRATE ER 30 MG PO TB24: 30.0000 mg | ORAL_TABLET | Freq: Every day | ORAL | 3 refills | Status: DC

## 2018-08-19 ENCOUNTER — Emergency Department (HOSPITAL_COMMUNITY): Payer: Medicare Other

## 2018-08-19 ENCOUNTER — Other Ambulatory Visit: Payer: Self-pay

## 2018-08-19 ENCOUNTER — Inpatient Hospital Stay (HOSPITAL_COMMUNITY)
Admission: EM | Admit: 2018-08-19 | Discharge: 2018-08-21 | DRG: 291 | Disposition: A | Discharge status: Home / Self Care | Payer: Medicare Other | Attending: Family Medicine | Admitting: Family Medicine

## 2018-08-19 ENCOUNTER — Encounter (HOSPITAL_COMMUNITY): Payer: Self-pay

## 2018-08-19 DIAGNOSIS — E118 Type 2 diabetes mellitus with unspecified complications: Secondary | ICD-10-CM

## 2018-08-19 DIAGNOSIS — D72829 Elevated white blood cell count, unspecified: Secondary | ICD-10-CM | POA: Diagnosis present

## 2018-08-19 DIAGNOSIS — I69351 Hemiplegia and hemiparesis following cerebral infarction affecting right dominant side: Secondary | ICD-10-CM | POA: Diagnosis not present

## 2018-08-19 DIAGNOSIS — Z955 Presence of coronary angioplasty implant and graft: Secondary | ICD-10-CM

## 2018-08-19 DIAGNOSIS — Z8249 Family history of ischemic heart disease and other diseases of the circulatory system: Secondary | ICD-10-CM

## 2018-08-19 DIAGNOSIS — G912 (Idiopathic) normal pressure hydrocephalus: Secondary | ICD-10-CM | POA: Diagnosis present

## 2018-08-19 DIAGNOSIS — I779 Disorder of arteries and arterioles, unspecified: Secondary | ICD-10-CM | POA: Diagnosis present

## 2018-08-19 DIAGNOSIS — E119 Type 2 diabetes mellitus without complications: Secondary | ICD-10-CM | POA: Diagnosis present

## 2018-08-19 DIAGNOSIS — J189 Pneumonia, unspecified organism: Secondary | ICD-10-CM | POA: Diagnosis present

## 2018-08-19 DIAGNOSIS — I110 Hypertensive heart disease with heart failure: Principal | ICD-10-CM | POA: Diagnosis present

## 2018-08-19 DIAGNOSIS — J811 Chronic pulmonary edema: Secondary | ICD-10-CM | POA: Diagnosis present

## 2018-08-19 DIAGNOSIS — R55 Syncope and collapse: Secondary | ICD-10-CM | POA: Diagnosis present

## 2018-08-19 DIAGNOSIS — Z833 Family history of diabetes mellitus: Secondary | ICD-10-CM | POA: Diagnosis not present

## 2018-08-19 DIAGNOSIS — E785 Hyperlipidemia, unspecified: Secondary | ICD-10-CM | POA: Diagnosis not present

## 2018-08-19 DIAGNOSIS — I480 Paroxysmal atrial fibrillation: Secondary | ICD-10-CM | POA: Diagnosis not present

## 2018-08-19 DIAGNOSIS — R0602 Shortness of breath: Secondary | ICD-10-CM | POA: Diagnosis not present

## 2018-08-19 DIAGNOSIS — I10 Essential (primary) hypertension: Secondary | ICD-10-CM | POA: Diagnosis not present

## 2018-08-19 DIAGNOSIS — M79621 Pain in right upper arm: Secondary | ICD-10-CM | POA: Diagnosis present

## 2018-08-19 DIAGNOSIS — Z7901 Long term (current) use of anticoagulants: Secondary | ICD-10-CM | POA: Diagnosis not present

## 2018-08-19 DIAGNOSIS — I739 Peripheral vascular disease, unspecified: Secondary | ICD-10-CM | POA: Diagnosis not present

## 2018-08-19 DIAGNOSIS — I252 Old myocardial infarction: Secondary | ICD-10-CM

## 2018-08-19 DIAGNOSIS — Z87891 Personal history of nicotine dependence: Secondary | ICD-10-CM

## 2018-08-19 DIAGNOSIS — I5033 Acute on chronic diastolic (congestive) heart failure: Secondary | ICD-10-CM | POA: Diagnosis present

## 2018-08-19 DIAGNOSIS — J9601 Acute respiratory failure with hypoxia: Secondary | ICD-10-CM | POA: Diagnosis not present

## 2018-08-19 DIAGNOSIS — Z8614 Personal history of Methicillin resistant Staphylococcus aureus infection: Secondary | ICD-10-CM

## 2018-08-19 DIAGNOSIS — W06XXXA Fall from bed, initial encounter: Secondary | ICD-10-CM | POA: Diagnosis present

## 2018-08-19 DIAGNOSIS — Y95 Nosocomial condition: Secondary | ICD-10-CM | POA: Diagnosis present

## 2018-08-19 DIAGNOSIS — Z79890 Hormone replacement therapy (postmenopausal): Secondary | ICD-10-CM | POA: Diagnosis not present

## 2018-08-19 DIAGNOSIS — M79601 Pain in right arm: Secondary | ICD-10-CM

## 2018-08-19 DIAGNOSIS — E039 Hypothyroidism, unspecified: Secondary | ICD-10-CM | POA: Diagnosis present

## 2018-08-19 DIAGNOSIS — J810 Acute pulmonary edema: Secondary | ICD-10-CM | POA: Diagnosis not present

## 2018-08-19 DIAGNOSIS — Z7902 Long term (current) use of antithrombotics/antiplatelets: Secondary | ICD-10-CM

## 2018-08-19 DIAGNOSIS — I509 Heart failure, unspecified: Secondary | ICD-10-CM | POA: Insufficient documentation

## 2018-08-19 DIAGNOSIS — G40909 Epilepsy, unspecified, not intractable, without status epilepticus: Secondary | ICD-10-CM | POA: Diagnosis present

## 2018-08-19 DIAGNOSIS — R079 Chest pain, unspecified: Secondary | ICD-10-CM | POA: Diagnosis present

## 2018-08-19 DIAGNOSIS — Z20828 Contact with and (suspected) exposure to other viral communicable diseases: Secondary | ICD-10-CM | POA: Diagnosis not present

## 2018-08-19 DIAGNOSIS — N400 Enlarged prostate without lower urinary tract symptoms: Secondary | ICD-10-CM | POA: Diagnosis present

## 2018-08-19 DIAGNOSIS — W19XXXA Unspecified fall, initial encounter: Secondary | ICD-10-CM

## 2018-08-19 DIAGNOSIS — I160 Hypertensive urgency: Secondary | ICD-10-CM | POA: Diagnosis present

## 2018-08-19 DIAGNOSIS — I2510 Atherosclerotic heart disease of native coronary artery without angina pectoris: Secondary | ICD-10-CM | POA: Diagnosis present

## 2018-08-19 LAB — BASIC METABOLIC PANEL
Anion gap: 15 (ref 5–15)
BUN: 24 mg/dL — ABNORMAL HIGH (ref 8–23)
CO2: 24 mmol/L (ref 22–32)
Calcium: 9.2 mg/dL (ref 8.9–10.3)
Chloride: 100 mmol/L (ref 98–111)
Creatinine, Ser: 1.16 mg/dL (ref 0.61–1.24)
GFR calc Af Amer: >60 mL/min (ref >60)
GFR calc non Af Amer: 58 mL/min — ABNORMAL LOW (ref >60)
Glucose, Bld: 133 mg/dL — ABNORMAL HIGH (ref 70–99)
Potassium: 4.2 mmol/L (ref 3.5–5.1)
Sodium: 139 mmol/L (ref 135–145)

## 2018-08-19 LAB — TROPONIN I
Troponin I: <0.03 ng/mL (ref <0.03)
Troponin I: 0.04 ng/mL (ref <0.03)
Troponin I: 0.05 ng/mL (ref <0.03)
Troponin I: 0.04 ng/mL (ref <0.03)

## 2018-08-19 LAB — CBC WITH DIFFERENTIAL/PLATELET
Abs Immature Granulocytes: 0.07 10*3/uL (ref 0.00–0.07)
Basophils Absolute: 0.1 10*3/uL (ref 0.0–0.1)
Basophils Relative: 0 %
Eosinophils Absolute: 0.4 10*3/uL (ref 0.0–0.5)
Eosinophils Relative: 2 %
HCT: 38.2 % — ABNORMAL LOW (ref 39.0–52.0)
Hemoglobin: 12.1 g/dL — ABNORMAL LOW (ref 13.0–17.0)
Immature Granulocytes: 0 %
Lymphocytes Relative: 14 %
Lymphs Abs: 2.2 10*3/uL (ref 0.7–4.0)
MCH: 29.9 pg (ref 26.0–34.0)
MCHC: 31.7 g/dL (ref 30.0–36.0)
MCV: 94.3 fL (ref 80.0–100.0)
Monocytes Absolute: 1.2 10*3/uL — ABNORMAL HIGH (ref 0.1–1.0)
Monocytes Relative: 8 %
Neutro Abs: 11.7 10*3/uL — ABNORMAL HIGH (ref 1.7–7.7)
Neutrophils Relative %: 76 %
Platelets: 267 10*3/uL (ref 150–400)
RBC: 4.05 MIL/uL — ABNORMAL LOW (ref 4.22–5.81)
RDW: 15.4 % (ref 11.5–15.5)
WBC: 15.7 10*3/uL — ABNORMAL HIGH (ref 4.0–10.5)
nRBC: 0 % (ref 0.0–0.2)

## 2018-08-19 LAB — MRSA PCR SCREENING: MRSA by PCR: NEGATIVE

## 2018-08-19 LAB — GLUCOSE, CAPILLARY
Glucose-Capillary: 176 mg/dL — ABNORMAL HIGH (ref 70–99)
Glucose-Capillary: 118 mg/dL — ABNORMAL HIGH (ref 70–99)
Glucose-Capillary: 128 mg/dL — ABNORMAL HIGH (ref 70–99)

## 2018-08-19 LAB — HEMOGLOBIN A1C
Hgb A1c MFr Bld: 6.4 % — ABNORMAL HIGH (ref 4.8–5.6)
Mean Plasma Glucose: 136.98 mg/dL

## 2018-08-19 LAB — SARS CORONAVIRUS 2 BY RT PCR (HOSPITAL ORDER, PERFORMED IN ~~LOC~~ HOSPITAL LAB): SARS Coronavirus 2: NEGATIVE

## 2018-08-19 LAB — PROTIME-INR
INR: 3.5 — ABNORMAL HIGH (ref 0.8–1.2)
Prothrombin Time: 34.3 seconds — ABNORMAL HIGH (ref 11.4–15.2)

## 2018-08-19 LAB — BRAIN NATRIURETIC PEPTIDE: B Natriuretic Peptide: 590 pg/mL — ABNORMAL HIGH (ref 0.0–100.0)

## 2018-08-19 MED ORDER — SODIUM CHLORIDE 0.9% FLUSH
3.0000 mL | Freq: Two times a day (BID) | INTRAVENOUS | Status: DC
  Administered 2018-08-19 – 2018-08-21 (×5): 3 mL via INTRAVENOUS

## 2018-08-19 MED ORDER — LEVOTHYROXINE SODIUM 75 MCG PO TABS
75.0000 ug | ORAL_TABLET | Freq: Every day | ORAL | Status: DC
  Administered 2018-08-20 – 2018-08-21 (×2): 75 ug via ORAL
  Filled 2018-08-19 (×2): qty 1

## 2018-08-19 MED ORDER — LIVING BETTER WITH HEART FAILURE BOOK: Freq: Once | Status: DC

## 2018-08-19 MED ORDER — FUROSEMIDE 10 MG/ML IJ SOLN
40.0000 mg | Freq: Two times a day (BID) | INTRAMUSCULAR | Status: DC
  Administered 2018-08-19: 40 mg via INTRAVENOUS
  Filled 2018-08-19: qty 4

## 2018-08-19 MED ORDER — HYDRALAZINE HCL 20 MG/ML IJ SOLN: 10.0000 mg | INTRAMUSCULAR | Status: DC | PRN

## 2018-08-19 MED ORDER — CARBAMAZEPINE ER 100 MG PO TB12
300.0000 mg | ORAL_TABLET | Freq: Two times a day (BID) | ORAL | Status: DC
  Administered 2018-08-19 – 2018-08-21 (×4): 300 mg via ORAL
  Filled 2018-08-19 (×7): qty 3

## 2018-08-19 MED ORDER — LEVETIRACETAM 250 MG PO TABS
250.0000 mg | ORAL_TABLET | Freq: Two times a day (BID) | ORAL | Status: DC
  Administered 2018-08-19 – 2018-08-21 (×5): 250 mg via ORAL
  Filled 2018-08-19 (×5): qty 1

## 2018-08-19 MED ORDER — CLONIDINE HCL 0.2 MG PO TABS
0.2000 mg | ORAL_TABLET | Freq: Two times a day (BID) | ORAL | Status: DC
  Administered 2018-08-19 – 2018-08-21 (×5): 0.2 mg via ORAL
  Filled 2018-08-19 (×5): qty 1

## 2018-08-19 MED ORDER — VANCOMYCIN HCL IN DEXTROSE 1-5 GM/200ML-% IV SOLN
1000.0000 mg | INTRAVENOUS | Status: AC
  Administered 2018-08-19: 1000 mg via INTRAVENOUS
  Filled 2018-08-19: qty 200

## 2018-08-19 MED ORDER — METOPROLOL TARTRATE 50 MG PO TABS
100.0000 mg | ORAL_TABLET | Freq: Two times a day (BID) | ORAL | Status: DC
  Administered 2018-08-19 – 2018-08-21 (×5): 100 mg via ORAL
  Filled 2018-08-19 (×5): qty 2

## 2018-08-19 MED ORDER — ATORVASTATIN CALCIUM 40 MG PO TABS
80.0000 mg | ORAL_TABLET | Freq: Every day | ORAL | Status: DC
  Administered 2018-08-19 – 2018-08-20 (×2): 80 mg via ORAL
  Filled 2018-08-19 (×2): qty 2

## 2018-08-19 MED ORDER — ESCITALOPRAM OXALATE 10 MG PO TABS
5.0000 mg | ORAL_TABLET | Freq: Every day | ORAL | Status: DC
  Administered 2018-08-19 – 2018-08-21 (×3): 5 mg via ORAL
  Filled 2018-08-19 (×3): qty 1

## 2018-08-19 MED ORDER — SODIUM CHLORIDE 0.9 % IV SOLN
250.0000 mL | INTRAVENOUS | Status: DC | PRN
  Administered 2018-08-19 – 2018-08-20 (×2): 250 mL via INTRAVENOUS

## 2018-08-19 MED ORDER — ACETAMINOPHEN 325 MG PO TABS: 650.0000 mg | ORAL_TABLET | ORAL | Status: DC | PRN

## 2018-08-19 MED ORDER — ISOSORBIDE MONONITRATE ER 60 MG PO TB24
30.0000 mg | ORAL_TABLET | Freq: Every day | ORAL | Status: DC
  Administered 2018-08-19 – 2018-08-21 (×3): 30 mg via ORAL
  Filled 2018-08-19 (×3): qty 1

## 2018-08-19 MED ORDER — INSULIN ASPART 100 UNIT/ML ~~LOC~~ SOLN
0.0000 [IU] | Freq: Three times a day (TID) | SUBCUTANEOUS | Status: DC
  Administered 2018-08-19 – 2018-08-20 (×2): 2 [IU] via SUBCUTANEOUS
  Administered 2018-08-21: 1 [IU] via SUBCUTANEOUS

## 2018-08-19 MED ORDER — FUROSEMIDE 10 MG/ML IJ SOLN
40.0000 mg | Freq: Once | INTRAMUSCULAR | Status: AC
  Administered 2018-08-19: 40 mg via INTRAVENOUS
  Filled 2018-08-19: qty 4

## 2018-08-19 MED ORDER — ONDANSETRON HCL 4 MG/2ML IJ SOLN: 4.0000 mg | Freq: Four times a day (QID) | INTRAMUSCULAR | Status: DC | PRN

## 2018-08-19 MED ORDER — ZOLPIDEM TARTRATE 5 MG PO TABS: 5.0000 mg | ORAL_TABLET | Freq: Every evening | ORAL | Status: DC | PRN

## 2018-08-19 MED ORDER — LOSARTAN POTASSIUM 50 MG PO TABS
50.0000 mg | ORAL_TABLET | Freq: Two times a day (BID) | ORAL | Status: DC
  Administered 2018-08-19 – 2018-08-21 (×5): 50 mg via ORAL
  Filled 2018-08-19 (×5): qty 1

## 2018-08-19 MED ORDER — INSULIN ASPART 100 UNIT/ML ~~LOC~~ SOLN: 0.0000 [IU] | Freq: Every day | SUBCUTANEOUS | Status: DC

## 2018-08-19 MED ORDER — NITROGLYCERIN 2 % TD OINT
1.0000 [in_us] | TOPICAL_OINTMENT | Freq: Once | TRANSDERMAL | Status: AC
  Administered 2018-08-19: 1 [in_us] via TOPICAL
  Filled 2018-08-19: qty 1

## 2018-08-19 MED ORDER — SODIUM CHLORIDE 0.9% FLUSH: 3.0000 mL | INTRAVENOUS | Status: DC | PRN

## 2018-08-19 MED ORDER — INSULIN ASPART 100 UNIT/ML ~~LOC~~ SOLN
2.0000 [IU] | Freq: Three times a day (TID) | SUBCUTANEOUS | Status: DC
  Administered 2018-08-19 – 2018-08-21 (×7): 2 [IU] via SUBCUTANEOUS

## 2018-08-19 MED ORDER — VANCOMYCIN HCL IN DEXTROSE 750-5 MG/150ML-% IV SOLN
750.0000 mg | Freq: Two times a day (BID) | INTRAVENOUS | Status: DC
  Administered 2018-08-19: 750 mg via INTRAVENOUS
  Filled 2018-08-19 (×2): qty 150

## 2018-08-19 MED ORDER — ALBUTEROL SULFATE HFA 108 (90 BASE) MCG/ACT IN AERS: 2.0000 | INHALATION_SPRAY | RESPIRATORY_TRACT | Status: DC | PRN

## 2018-08-19 MED ORDER — SODIUM CHLORIDE 0.9 % IV SOLN
2.0000 g | Freq: Two times a day (BID) | INTRAVENOUS | Status: DC
  Administered 2018-08-19: 10:00:00 via INTRAVENOUS
  Administered 2018-08-19 – 2018-08-20 (×2): 2 g via INTRAVENOUS
  Administered 2018-08-20: 11:00:00 via INTRAVENOUS
  Administered 2018-08-21: 2 g via INTRAVENOUS
  Filled 2018-08-19 (×5): qty 2

## 2018-08-19 MED ORDER — ALBUTEROL SULFATE (2.5 MG/3ML) 0.083% IN NEBU: 2.5000 mg | INHALATION_SOLUTION | RESPIRATORY_TRACT | Status: DC | PRN

## 2018-08-19 MED ORDER — ADULT MULTIVITAMIN W/MINERALS CH
1.0000 | ORAL_TABLET | Freq: Every day | ORAL | Status: DC
  Administered 2018-08-19 – 2018-08-20 (×2): 1 via ORAL
  Filled 2018-08-19 (×2): qty 1

## 2018-08-19 MED ORDER — APIXABAN 5 MG PO TABS
5.0000 mg | ORAL_TABLET | Freq: Two times a day (BID) | ORAL | Status: DC
  Administered 2018-08-19 (×2): 5 mg via ORAL
  Filled 2018-08-19 (×2): qty 1

## 2018-08-19 MED ORDER — ALPRAZOLAM 0.25 MG PO TABS: 0.2500 mg | ORAL_TABLET | Freq: Two times a day (BID) | ORAL | Status: DC | PRN

## 2018-08-19 NOTE — ED Notes (Signed)
Pt's Daughter Tiffany Kocher can be contacted at [phone].

## 2018-08-19 NOTE — Progress Notes (Signed)
..  CRITICAL VALUE STICKER  CRITICAL VALUE: troponin 0.05 RECEIVER (on-site recipient of call): Nelta Numbers RN  DATE & TIME NOTIFIED: 08/19/18 1600  MESSENGER (representative from lab): Kristeen Miss  MD NOTIFIED: Dr. Wynetta Emery   TIME OF NOTIFICATION: 1600  RESPONSE:  See chart

## 2018-08-19 NOTE — Progress Notes (Signed)
..  CRITICAL VALUE STICKER  CRITICAL VALUE: troponin 0.04  RECEIVER (on-site recipient of call): Nelta Numbers, RN  [place] NOTIFIED:  08/19/18 1030  MESSENGER (representative from lab): Kristeen Miss  MD NOTIFIED: Dr. Wynetta Emery  TIME OF NOTIFICATION: 1030  RESPONSE: awaiting response. See chart for new orders.

## 2018-08-19 NOTE — H&P (Signed)
History and Physical  [name] UUV:[id number] DOB: October 18, 1933 DOA: 08/19/2018  Referring physician: Alvino Chapel PCP: [name], MD   Chief Complaint: SOB  HPI: [name] is a 85 y.o. male with Afib/Aflutter, hypertension, CAD, DM, diastolic CHF, history of MI and CVA recently discharged from [place] after a hospitalization at [facility] for recurrent syncopal episodes who presented to ED after falling out of bed and having shortness of breath and chest pain.  He reportedly did not have a hard fall. He slid out of the bed onto the floor.   He did not hit his head.  He says that he started having shortness of breath after waking up this morning.  He says that he has been feeling well until this time since being discharged from [facility].  He is being worked up by his neurologist for recurrent syncopal episodes.  He has been wearing a heart monitor.  He has an appointment in [place] for an evaluation for a suspected NPH.  He does report that he has had a mild cough and denies having fever.  He has having right chest wall pain.     ED Course:  Pt was noted to be hypertensive and was tachypneic. He had a CXR that showed interstitial edema.  He was started on lasix.  He was started on a nitroglycerin paste. His COVID testing has been negative.  His initial troponin has been less than 0.03.  He is being admitted for further evaluation and management.    Review of Systems: All systems reviewed and apart from history of presenting illness, are negative.  Past Medical History:  Diagnosis Date  . Atrial flutter ([place])   . BPH (benign prostatic hyperplasia)   . Coronary artery disease    a. s/p MI tx with Cypher DES to pRCA in 4/04;  b. Echocardiogram 7/09: Normal LV function.  c. Nuclear study 3/13 no ischemia;  d. ETT 2/14 neg;  e. admx with CP => [facility] (01/30/2013):  pLAD 40-50%, oD1 70-80%, oOM1 40%, pRCA stent patent, mid RCA 30%, EF 60-65%. => med Rx.  . DDD (degenerative disc disease)    . Diabetes mellitus without complication ([place])   . Dyslipidemia   . Gait disorder 07/13/2018  . Hemorrhoids   . Hx MRSA infection    left buttocks abscess  . Hx of echocardiogram    a. Echocardiogram (01/31/2013): Mild focal basal and mild concentric hypertrophy of the septum, EF 50-55%, normal wall motion, grade 1 diastolic dysfunction, trivial AI, MAC, mild LAE, PASP 35  . Hyperlipidemia   . Hypertension   . Hypothyroidism   . Meniere's disease    Status post shunt  . Myocardial infarction ([place]) 2008  . Occlusion and stenosis of carotid artery without mention of cerebral infarction    40-59% on carotid doppler 2014; [place] (01/2013): R 1-39%, L 60-79%  . Rotator cuff injury    chronic rotator cuff injury status post repair  . Seizure disorder ([place])   . Stroke [facility])    a. 01/2013=> post cardiac cath CVA to L post communicating artery system; R sided weakness  . Syncope    Past Surgical History:  Procedure Laterality Date  . COLONOSCOPY    . CORONARY ANGIOPLASTY WITH STENT PLACEMENT  07/14/2002   Stent to the right coronary artery  . ENDOLYMPHATIC SHUNT DECOMPRESSION  06/11/2009    Right endolymphatic sac decompression and shunt  placement  . HERNIA REPAIR  04/10/2008   scrotal hernia  repair  . LEFT HEART CATHETERIZATION WITH CORONARY ANGIOGRAM N/A 01/30/2013   Procedure: LEFT HEART CATHETERIZATION WITH CORONARY ANGIOGRAM;  Surgeon: Larey Dresser, MD;  Location: [facility] CATH LAB;  Service: Cardiovascular;  Laterality: N/A;  . ROTATOR CUFF REPAIR     for chronic rotator cuff injury   Social History:  reports that he quit smoking about 62 years ago. His smoking use included cigarettes. He quit after 5.00 years of use. He has quit using smokeless tobacco.  His smokeless tobacco use included chew. He reports that he does not drink alcohol or use drugs.  No Known Allergies  Family History  Problem Relation Age of Onset  . Heart attack Father 33  . Aneurysm Mother        brain  aneurysm  . Coronary artery disease Brother        with CABG  . Diabetes Brother   . Colon cancer Neg Hx   . Colon polyps Neg Hx     Prior to Admission medications   Medication Sig Start Date End Date Taking? Authorizing Provider  acetaminophen (TYLENOL) 325 MG tablet Take 1-2 tablets (325-650 mg total) by mouth every 4 (four) hours as needed for mild pain. 06/15/18   Love, Ivan Anchors, PA-C  albuterol (PROVENTIL HFA;VENTOLIN HFA) 108 (90 Base) MCG/ACT inhaler Inhale 2 puffs into the lungs every 4 (four) hours as needed for wheezing or shortness of breath. 03/17/18   Delsa Grana, PA-C  apixaban (ELIQUIS) 5 MG TABS tablet Take 1 tablet (5 mg total) by mouth 2 (two) times daily. 08/17/18   [name], MD  atorvastatin (LIPITOR) 80 MG tablet Take 1 tablet (80 mg total) by mouth daily at 6 PM. 07/17/18   [name], MD  carbamazepine (CARBATROL) 300 MG 12 hr capsule Take 1 capsule (300 mg total) by mouth 2 (two) times daily. 06/15/18   Love, Ivan Anchors, PA-C  cloNIDine (CATAPRES) 0.2 MG tablet Take 1 tablet (0.2 mg total) by mouth 2 (two) times daily. 06/15/18   Love, Ivan Anchors, PA-C  escitalopram (LEXAPRO) 5 MG tablet TAKE 1 TABLET BY MOUTH EVERYDAY AT BEDTIME 07/10/18   Meredith Staggers, MD  isosorbide mononitrate (IMDUR) 30 MG 24 hr tablet Take 1 tablet (30 mg total) by mouth daily. 08/18/18   [name], MD  levETIRAcetam (KEPPRA) 250 MG tablet Take 1 tablet (250 mg total) by mouth 2 (two) times daily. 07/13/18   Kathrynn Ducking, MD  levothyroxine (SYNTHROID, LEVOTHROID) 75 MCG tablet TAKE 1 TABLET BY MOUTH EVERY DAY Patient taking differently: Take 75 mcg by mouth daily.  04/13/18   [name], MD  losartan (COZAAR) 100 MG tablet Take 0.5 tablets (50 mg total) by mouth 2 (two) times daily. 06/15/18   Love, Ivan Anchors, PA-C  metFORMIN (GLUCOPHAGE) 500 MG tablet TAKE 1/2 TABLET (250 MG) BY MOUTH 2 TIMES DAILY WITH A MEAL. 08/07/18   [name], MD  metoprolol tartrate  (LOPRESSOR) 100 MG tablet Take 1 tablet (100 mg total) by mouth 2 (two) times daily. 06/15/18   Love, Ivan Anchors, PA-C  Multiple Vitamins-Minerals (MULTIVITAMINS THER. W/MINERALS) TABS Take 1 tablet by mouth at bedtime.      [provider]  warfarin (COUMADIN) 7.5 MG tablet TAKE 1&1/2 TABS TUESDAY, THURSDAY & SATURDAY*1 TAB ON Aldona Lento, [place] SUPPER 08/16/18   [name], MD   Physical Exam: Vitals:   08/19/18 0600 08/19/18 0630 08/19/18 0700 08/19/18 0730  BP: (!) 239/106 (!) 252/97 (!) 218/86 (!) 178/99  Pulse: 92 (!) 114 (!) 48 92  Resp: (!) 23 (!) 33 (!) 35 (!) 35  Temp:      TempSrc:      SpO2: 95% 91% 90% 93%  Weight:      Height:        General exam: chronically ill appearing.  Moderately built and nourished patient, lying comfortably supine on the gurney in no obvious distress.  Head, eyes and ENT: Nontraumatic and normocephalic. Pupils equally reacting to light and accommodation. Oral mucosa moist.  Neck: Supple. No JVD, carotid bruit or thyromegaly.  Lymphatics: No lymphadenopathy.  Respiratory system: Clear to auscultation. No increased work of breathing.  Cardiovascular system: normal S1 and S2 heard. No JVD, 1+ pedal edema.  Gastrointestinal system: Abdomen is nondistended, soft and nontender. Normal bowel sounds heard. No organomegaly or masses appreciated.  Central nervous system: Alert and oriented. No focal neurological deficits.  Extremities: Symmetric 5 x 5 power. Peripheral pulses symmetrically felt.   Skin: No rashes or acute findings.  Musculoskeletal system: Negative exam.  Psychiatry: Pleasant and cooperative.  Labs on Admission:  Basic Metabolic Panel: Recent Labs  Lab 08/19/18 0550  NA 139  K 4.2  CL 100  CO2 24  GLUCOSE 133*  BUN 24*  CREATININE 1.16  CALCIUM 9.2   Liver Function Tests: No results for input(s): AST, ALT, ALKPHOS, BILITOT, PROT, ALBUMIN in the last 168 hours. No results for  input(s): LIPASE, AMYLASE in the last 168 hours. No results for input(s): AMMONIA in the last 168 hours. CBC: Recent Labs  Lab 08/19/18 0550  WBC 15.7*  NEUTROABS 11.7*  HGB 12.1*  HCT 38.2*  MCV 94.3  PLT 267   Cardiac Enzymes: Recent Labs  Lab 08/19/18 0550  TROPONINI <0.03    BNP (last 3 results) No results for input(s): PROBNP in the last 8760 hours. CBG: No results for input(s): GLUCAP in the last 168 hours.  Radiological Exams on Admission: Dg Chest Port 1 View  Result Date: 08/19/2018 CLINICAL DATA:  Shortness of breath EXAM: PORTABLE CHEST 1 VIEW COMPARISON:  05/29/2018 FINDINGS: Low lung volumes with vascular crowding. Mild interstitial edema is difficult to exclude. Mild left basilar opacity, likely atelectasis. No pneumothorax. Heart is top-normal in size for inspiration. IMPRESSION: Low lung volumes with vascular crowding. Possible mild interstitial edema. Mild left basilar opacity, likely atelectasis. Electronically Signed   By: Julian Hy M.D.   On: 08/19/2018 06:15    EKG: Independently reviewed. Atrial flutter   Assessment/Plan Principal Problem:   Acute exacerbation of CHF (congestive heart failure) (HCC) Active Problems:   Dyslipidemia   HTN (hypertension)   Bilateral carotid artery disease (HCC)   Chest pain   Pulmonary edema   Paroxysmal atrial fibrillation (HCC)   Hypothyroidism   Controlled type 2 diabetes mellitus with complication, without long-term current use of insulin (HCC)   Leucocytosis   Recurrent syncope   Hypertensive urgency  1. Acute diastolic CHF exacerbation - secondary to hypertensive urgency - The patient is going to be diuresed with IV lasix.  Monitor I/O and weights.  Monitor electrolytes.  Follow in stepdown ICU.  2. Atrial Fibrillation/Atrial Flutter - Pt is on metoprolol for rate control and recently started on apixaban 5 mg BID and now off warfarin for full anticoagulation.    3. Chest pain - suspect secondary to  CHF, trending troponin.  Follow telemetry.  4. Leukocytosis - suspect may have some pneumonia -  COVID negative.  With recent hospitalization and rehab, will treat for HCAP.  5. HCAP - treating with cefepime/vancomycin and supportive therapy.  6. Hypertensive urgency - difficult to control hypertension, nitropaste in place, IV hydralazine ordered, resume home blood pressure medications.   7. Type 2 DM - supplemental sliding scale, prandial insulin ordered, hold home metformin.  8. Hypothyroidism - resume home levothyroxine.  9. Dyslipidemia - resume home medication.  10. Recurrent syncopal episodes/AMS and possible NPH - He is being followed closely outpatient by his neurologist, cardiologist and is going to be seen in [facility] for evaluation of NPH.     DVT Prophylaxis: apixaban  Code Status: Full   Family Communication: daughter  Disposition Plan: stepdown ICU   Critical Care Time spent: 20 mins  Clanford Wynetta Emery, MD [facility] How to contact the [facility] Attending or Consulting provider [place] or covering provider during after hours [place], for this patient?  1. Check the care team in [facility] and look for a) attending/consulting TR[facility] provider listed and b) the [facility] team listed 2. Log into [email or web address] and use [place]'s universal password to access. If you do not have the password, please contact the hospital operator. 3. Locate the [facility] provider you are looking for under [facility] and page to a number that you can be directly reached. 4. If you still have difficulty reaching the provider, please page the [facility] (Director on Call) for the Hospitalists listed on amion for assistance.

## 2018-08-19 NOTE — ED Notes (Signed)
ED Provider at bedside. 

## 2018-08-19 NOTE — ED Provider Notes (Signed)
[facility] EMERGENCY DEPARTMENT Provider Note   CSN: [id number] Arrival date & time: 08/19/18  8921    History   Chief Complaint Chief Complaint  Patient presents with  . Shortness of Breath  . Fall    HPI [name] is a 85 y.o. male.     The history is provided by the patient and a relative.  Shortness of Breath  Severity:  Moderate Onset quality:  Sudden Duration: Just prior to arrival. Timing:  Constant Progression:  Worsening Chronicity:  New Relieved by:  Nothing Worsened by:  Nothing Ineffective treatments:  None tried Associated symptoms: cough   Associated symptoms: no fever   Associated symptoms comment:  Right chest wall pain Fall  Associated symptoms include shortness of breath.  Patient history of atrial flutter on anticoagulation, history of CAD, history of diabetes, history of CVA presents with shortness of breath. Patient reports he was trying to get out of bed tonight because he felt short of breath and he slid out of the bed and may have injured the right side of his chest.  No head injury. He has had mild cough recently.  No fever.  Past Medical History:  Diagnosis Date  . Atrial flutter ([place])   . BPH (benign prostatic hyperplasia)   . Coronary artery disease    a. s/p MI tx with Cypher DES to pRCA in 4/04;  b. Echocardiogram 7/09: Normal LV function.  c. Nuclear study 3/13 no ischemia;  d. ETT 2/14 neg;  e. admx with CP => LHC (01/30/2013):  pLAD 40-50%, oD1 70-80%, oOM1 40%, pRCA stent patent, mid RCA 30%, EF 60-65%. => med Rx.  . DDD (degenerative disc disease)   . Diabetes mellitus without complication ([place])   . Dyslipidemia   . Gait disorder 07/13/2018  . Hemorrhoids   . Hx MRSA infection    left buttocks abscess  . Hx of echocardiogram    a. Echocardiogram (01/31/2013): Mild focal basal and mild concentric hypertrophy of the septum, EF 50-55%, normal wall motion, grade 1 diastolic dysfunction, trivial AI, MAC, mild LAE, PASP 35  .  Hyperlipidemia   . Hypertension   . Hypothyroidism   . Meniere's disease    Status post shunt  . Myocardial infarction ([place]) 2008  . Occlusion and stenosis of carotid artery without mention of cerebral infarction    40-59% on carotid doppler 2014; [place] (01/2013): R 1-39%, L 60-79%  . Rotator cuff injury    chronic rotator cuff injury status post repair  . Seizure disorder ([place])   . Stroke [facility])    a. 01/2013=> post cardiac cath CVA to L post communicating artery system; R sided weakness  . Syncope     Patient Active Problem List   Diagnosis Date Noted  . Gait disorder 07/13/2018  . Recurrent syncope 07/13/2018  . E. coli UTI 06/19/2018  . Leucocytosis 06/19/2018  . Normal pressure hydrocephalus ([place]) 06/01/2018  . Altered mental status 05/31/2018  . AMS (altered mental status) 05/30/2018  . Seizure disorder ([place]) 05/30/2018  . Fracture of proximal phalanx of finger fifth 06/17/2017 07/21/2017  . Upper airway cough syndrome 11/01/2016  . Atrial flutter ([place]) 08/05/2016  . Coronary artery disease involving native coronary artery of native heart without angina pectoris 08/05/2016  . Chronic anticoagulation 12/31/2015  . History of seizures 12/31/2015  . Acute diastolic CHF (congestive heart failure) ([place]) 12/24/2015  . Pulmonary edema   . Paroxysmal atrial fibrillation (HCC)   . Hypothyroidism   .  Controlled type 2 diabetes mellitus with complication, without long-term current use of insulin ([place])   . Hypertensive emergency 12/20/2015  . Special screening for malignant neoplasms, colon 12/07/2013  . Bowel habit changes 12/07/2013  . Diarrhea 03/09/2013  . History of cardioembolic cerebrovascular accident (CVA) 01/31/2013  . Chest pain 01/29/2013  . Bilateral carotid artery disease ([place])   . Loss of coordination 12/03/2012  . Morbid obesity due to excess calories ([place]) 08/20/2011  . CAD S/P percutaneous coronary angioplasty   . Dyslipidemia   . HTN (hypertension)     Past  Surgical History:  Procedure Laterality Date  . COLONOSCOPY    . CORONARY ANGIOPLASTY WITH STENT PLACEMENT  07/14/2002   Stent to the right coronary artery  . ENDOLYMPHATIC SHUNT DECOMPRESSION  06/11/2009    Right endolymphatic sac decompression and shunt  placement  . HERNIA REPAIR  04/10/2008   scrotal hernia repair  . LEFT HEART CATHETERIZATION WITH CORONARY ANGIOGRAM N/A 01/30/2013   Procedure: LEFT HEART CATHETERIZATION WITH CORONARY ANGIOGRAM;  Surgeon: Larey Dresser, MD;  Location[facility] C[facility] LAB;  Service: Cardiovascular;  Laterality: N/A;  . ROTATOR CUFF REPAIR     for chronic rotator cuff injury        Home Medications    Prior to Admission medications   Medication Sig Start Date End Date Taking? Authorizing Provider  acetaminophen (TYLENOL) 325 MG tablet Take 1-2 tablets (325-650 mg total) by mouth every 4 (four) hours as needed for mild pain. 06/15/18   Love, Ivan Anchors, PA-C  albuterol (PROVENTIL HFA;VENTOLIN HFA) 108 (90 Base) MCG/ACT inhaler Inhale 2 puffs into the lungs every 4 (four) hours as needed for wheezing or shortness of breath. 03/17/18   Delsa Grana, PA-C  apixaban (ELIQUIS) 5 MG TABS tablet Take 1 tablet (5 mg total) by mouth 2 (two) times daily. 08/17/18   [name], MD  atorvastatin (LIPITOR) 80 MG tablet Take 1 tablet (80 mg total) by mouth daily at 6 PM. 07/17/18   [name], MD  carbamazepine (CARBATROL) 300 MG 12 hr capsule Take 1 capsule (300 mg total) by mouth 2 (two) times daily. 06/15/18   Love, Ivan Anchors, PA-C  cloNIDine (CATAPRES) 0.2 MG tablet Take 1 tablet (0.2 mg total) by mouth 2 (two) times daily. 06/15/18   Love, Ivan Anchors, PA-C  escitalopram (LEXAPRO) 5 MG tablet TAKE 1 TABLET BY MOUTH EVERYDAY AT BEDTIME 07/10/18   Meredith Staggers, MD  isosorbide mononitrate (IMDUR) 30 MG 24 hr tablet Take 1 tablet (30 mg total) by mouth daily. 08/18/18   [name], MD  levETIRAcetam (KEPPRA) 250 MG tablet Take 1 tablet (250 mg total) by  mouth 2 (two) times daily. 07/13/18   Kathrynn Ducking, MD  levothyroxine (SYNTHROID, LEVOTHROID) 75 MCG tablet TAKE 1 TABLET BY MOUTH EVERY DAY Patient taking differently: Take 75 mcg by mouth daily.  04/13/18   [name], MD  losartan (COZAAR) 100 MG tablet Take 0.5 tablets (50 mg total) by mouth 2 (two) times daily. 06/15/18   Love, Ivan Anchors, PA-C  metFORMIN (GLUCOPHAGE) 500 MG tablet TAKE 1/2 TABLET (250 MG) BY MOUTH 2 TIMES DAILY WITH A MEAL. 08/07/18   [name], MD  metoprolol tartrate (LOPRESSOR) 100 MG tablet Take 1 tablet (100 mg total) by mouth 2 (two) times daily. 06/15/18   Love, Ivan Anchors, PA-C  Multiple Vitamins-Minerals (MULTIVITAMINS THER. W/MINERALS) TABS Take 1 tablet by mouth at bedtime.      [provider]  warfarin (COUMADIN) 7.5 MG tablet TAKE 1&1/2 TABS TUESDAY, THURSDAY & SATURDAY*1 TAB ON [place], WEDNESDAY & FRIDAY WITH SUPPER 08/16/18   [name], MD    Family History Family History  Problem Relation Age of Onset  . Heart attack Father 21  . Aneurysm Mother        brain aneurysm  . Coronary artery disease Brother        with CABG  . Diabetes Brother   . Colon cancer Neg Hx   . Colon polyps Neg Hx     Social History Social History   Tobacco Use  . Smoking status: Former Smoker    Years: 5.00    Types: Cigarettes    Last attempt to quit: 08/19/1956    Years since quitting: 62.0  . Smokeless tobacco: Former Systems developer    Types: Chew  Substance Use Topics  . Alcohol use: No  . Drug use: No     Allergies   Patient has no known allergies.   Review of Systems Review of Systems  Constitutional: Negative for fever.  Respiratory: Positive for cough and shortness of breath.   Cardiovascular: Positive for leg swelling.  All other systems reviewed and are negative.    Physical Exam Updated Vital Signs BP (!) 238/111 (BP Location: Left Arm)   Pulse 93   Temp 99.2 F (37.3 C) (Oral)   Resp (!) 24   Ht 1.651 m (_0 )    Wt 98 kg   SpO2 98%   BMI 35.94 kg/m   Physical Exam CONSTITUTIONAL: Elderly HEAD: Normocephalic/atraumatic EYES: EOMI/PERRL ENMT: Mucous membranes moist NECK: supple no meningeal signs, + JVD SPINE/BACK:entire spine nontender CV: Tachycardic and irregular LUNGS: Crackles bilaterally, mild tachypnea.  He was hypoxic on room air to the 80s Chest-no chest wall tenderness or crepitus, no deformity Heart monitor noted on chest ABDOMEN: soft, nontender, no rebound or guarding, bowel sounds noted throughout abdomen, no RUQ tenderness GU:no cva tenderness NEURO: Pt is awake/alert/appropriate, moves all extremitiesx4.  No facial droop.   EXTREMITIES: pulses normal/equal, full ROM, pitting edema to lower extremities SKIN: warm, color normal PSYCH: no abnormalities of mood noted, alert and oriented to situation   ED Treatments / Results  Labs (all labs ordered are listed, but only abnormal results are displayed) Labs Reviewed  BASIC METABOLIC PANEL - Abnormal; Notable for the following components:      Result Value   Glucose, Bld 133 (*)    BUN 24 (*)    GFR calc non Af Amer 58 (*)    All other components within normal limits  CBC WITH DIFFERENTIAL/PLATELET - Abnormal; Notable for the following components:   WBC 15.7 (*)    RBC 4.05 (*)    Hemoglobin 12.1 (*)    HCT 38.2 (*)    Neutro Abs 11.7 (*)    Monocytes Absolute 1.2 (*)    All other components within normal limits  BRAIN NATRIURETIC PEPTIDE - Abnormal; Notable for the following components:   B Natriuretic Peptide 590.0 (*)    All other components within normal limits  PROTIME-INR - Abnormal; Notable for the following components:   Prothrombin Time 34.3 (*)    INR 3.5 (*)    All other components within normal limits  SARS CORONAVIRUS 2 (HOSPITAL ORDER, [facility] [facility])  TROPONIN I    EKG EKG Interpretation  Date/Time:  Saturday Aug 19 2018 05:41:16 EDT Ventricular Rate:  95 PR Interval:  QRS Duration: 136 QT Interval:  362 QTC Calculation: 456 R Axis:   -28 Text Interpretation:  Atrial flutter Left bundle branch block Baseline wander in lead(s) V6 Confirmed by Ripley Fraise [phone]) on 08/19/2018 5:49:31 AM   Radiology Dg Chest Port 1 View  Result Date: 08/19/2018 CLINICAL DATA:  Shortness of breath EXAM: PORTABLE CHEST 1 VIEW COMPARISON:  05/29/2018 FINDINGS: Low lung volumes with vascular crowding. Mild interstitial edema is difficult to exclude. Mild left basilar opacity, likely atelectasis. No pneumothorax. Heart is top-normal in size for inspiration. IMPRESSION: Low lung volumes with vascular crowding. Possible mild interstitial edema. Mild left basilar opacity, likely atelectasis. Electronically Signed   By: Julian Hy M.D.   On: 08/19/2018 06:15    Procedures Procedures  CRITICAL CARE Performed by: Sharyon Cable Total critical care time: 35 minutes Critical care time was exclusive of separately billable procedures and treating other patients. Critical care was necessary to treat or prevent imminent or life-threatening deterioration. Critical care was time spent personally by me on the following activities: development of treatment plan with patient and/or surrogate as well as nursing, discussions with consultants, evaluation of patient's response to treatment, examination of patient, obtaining history from patient or surrogate, ordering and performing treatments and interventions, ordering and review of laboratory studies, ordering and review of radiographic studies, pulse oximetry and re-evaluation of patient's condition. PATIENT WITH HYPERTENSIVE EMERGENCY/CHF REQUIRING ADMISSION  Medications Ordered in ED Medications  furosemide (LASIX) injection 40 mg (40 mg Intravenous Given 08/19/18 0629)  nitroGLYCERIN (NITROGLYN) 2 % ointment 1 inch (1 inch Topical Given 08/19/18 0629)     Initial Impression / Assessment and Plan / ED Course  I have reviewed  the triage vital signs and the nursing notes.  Pertinent labs & imaging results that were available during my care of the patient were reviewed by me and considered in my medical decision making (see chart for details).        6:00 AM Patient presents with shortness of breath.  Apparently was trying out of bed because he felt out of breath and he may have injured his chest wall.  Strong suspicion for acute CHF.  Discussed the case with his daughter [name].  Phone number [phone] Patient has been wearing a heart monitor for a few weeks, has been having altered mental status spells and this is part of the work-up.  He  also reportedly has normal pressure hydrocephalus and this is being worked up 6:51 AM Patient continues to be hypertensive, tachypneic.  X-ray consistent with interstitial edema, plan at this time to treat for CHF Patient has been given nitroglycerin and Lasix. D/w daughter about the case  7:09 AM D/w dr Thurnell Garbe who will call admission If COVID negative, he would benefit from bipap Pt is awake/alert but tachypneic   [name] was evaluated in Emergency Department on 08/19/2018 for the symptoms described in the history of present illness. He was evaluated in the context of the global COVID-19 pandemic, which necessitated consideration that the patient might be at risk for infection with the SARS-CoV-2 virus that causes COVID-19. Institutional protocols and algorithms that pertain to the evaluation of patients at risk for COVID-19 are in a state of rapid change based on information released by regulatory bodies including the CDC and federal and state organizations. These policies and algorithms were followed during the patient's care in the ED.   Final Clinical Impressions(s) / ED Diagnoses   Final diagnoses:  Acute respiratory failure with  hypoxia ([place])  Acute pulmonary edema [facility])    ED Discharge Orders    None       Ripley Fraise, MD 08/19/18 [phone]

## 2018-08-19 NOTE — ED Notes (Signed)
EKG done and seen by Dr Christy Gentles. Patient on 12 lead

## 2018-08-19 NOTE — Progress Notes (Signed)
Pharmacy Antibiotic Note  [name] is a 85 y.o. male admitted on 08/19/2018 with pneumonia/HCAP  Pharmacy has been consulted for Vancomycin and cefepime dosing.  Plan: Vancomycin 2000mg  loading dose, then 750mg   IV every 12 hours.  Goal trough 15-20 mcg/mL.  Cefepime 2gm IV q12h F/U cxs and clinical progress Monitor V/S, labs and levels as indicated  Height: 5\' 5"  (165.1 cm) Weight: 216 lb (98 kg) IBW/kg (Calculated) : 61.5  Temp (24hrs), Avg:99.3 F (37.4 C), Min:99.2 F (37.3 C), Max:99.4 F (37.4 C)  Recent Labs  Lab 08/19/18 0550  WBC 15.7*  CREATININE 1.16    Estimated Creatinine Clearance: 51.9 mL/min (by C-G formula based on SCr of 1.16 mg/dL).    No Known Allergies  Antimicrobials this admission: Vancomycin 5/30 >>  Cefepime 5/30 >>   Dose adjustments this admission: n/a  Microbiology results:  BCx:   UCx:  5/30 SARS-2 CV is negative  MRSA PCR:   Thank you for allowing pharmacy to be a part of this patient's care.  Isac Sarna, BS Pharm D, [place] Clinical Pharmacist Pager [phone] 08/19/2018 8:42 AM

## 2018-08-19 NOTE — ED Triage Notes (Signed)
Per patient and family, pt was feeling sob while in the bed and was getting up to breathe better when he slid off the side of the bed.  Pt denies injury or hitting his head, states his breathing diff started tonight.  Pt is wearing a heart monitor for "spells" he has had recently.   Pt also having pain to his right lateral chest.

## 2018-08-20 ENCOUNTER — Encounter (HOSPITAL_COMMUNITY): Payer: Self-pay | Admitting: Radiology

## 2018-08-20 ENCOUNTER — Inpatient Hospital Stay (HOSPITAL_COMMUNITY): Payer: Medicare Other

## 2018-08-20 LAB — CBC WITH DIFFERENTIAL/PLATELET
Abs Immature Granulocytes: 0.04 10*3/uL (ref 0.00–0.07)
Basophils Absolute: 0.1 10*3/uL (ref 0.0–0.1)
Basophils Relative: 1 %
Eosinophils Absolute: 0.5 10*3/uL (ref 0.0–0.5)
Eosinophils Relative: 5 %
HCT: 34.1 % — ABNORMAL LOW (ref 39.0–52.0)
Hemoglobin: 10.7 g/dL — ABNORMAL LOW (ref 13.0–17.0)
Immature Granulocytes: 0 %
Lymphocytes Relative: 24 %
Lymphs Abs: 2.3 10*3/uL (ref 0.7–4.0)
MCH: 29.6 pg (ref 26.0–34.0)
MCHC: 31.4 g/dL (ref 30.0–36.0)
MCV: 94.5 fL (ref 80.0–100.0)
Monocytes Absolute: 1.1 10*3/uL — ABNORMAL HIGH (ref 0.1–1.0)
Monocytes Relative: 12 %
Neutro Abs: 5.7 10*3/uL (ref 1.7–7.7)
Neutrophils Relative %: 58 %
Platelets: 247 10*3/uL (ref 150–400)
RBC: 3.61 MIL/uL — ABNORMAL LOW (ref 4.22–5.81)
RDW: 15.4 % (ref 11.5–15.5)
WBC: 9.7 10*3/uL (ref 4.0–10.5)
nRBC: 0 % (ref 0.0–0.2)

## 2018-08-20 LAB — GLUCOSE, CAPILLARY
Glucose-Capillary: 124 mg/dL — ABNORMAL HIGH (ref 70–99)
Glucose-Capillary: 115 mg/dL — ABNORMAL HIGH (ref 70–99)
Glucose-Capillary: 188 mg/dL — ABNORMAL HIGH (ref 70–99)
Glucose-Capillary: 112 mg/dL — ABNORMAL HIGH (ref 70–99)
Glucose-Capillary: 163 mg/dL — ABNORMAL HIGH (ref 70–99)

## 2018-08-20 LAB — COMPREHENSIVE METABOLIC PANEL
ALT: 17 U/L (ref 0–44)
AST: 17 U/L (ref 15–41)
Albumin: 3.4 g/dL — ABNORMAL LOW (ref 3.5–5.0)
Alkaline Phosphatase: 64 U/L (ref 38–126)
Anion gap: 12 (ref 5–15)
BUN: 27 mg/dL — ABNORMAL HIGH (ref 8–23)
CO2: 26 mmol/L (ref 22–32)
Calcium: 8.6 mg/dL — ABNORMAL LOW (ref 8.9–10.3)
Chloride: 99 mmol/L (ref 98–111)
Creatinine, Ser: 1.08 mg/dL (ref 0.61–1.24)
GFR calc Af Amer: >60 mL/min (ref >60)
GFR calc non Af Amer: >60 mL/min (ref >60)
Glucose, Bld: 123 mg/dL — ABNORMAL HIGH (ref 70–99)
Potassium: 3.8 mmol/L (ref 3.5–5.1)
Sodium: 137 mmol/L (ref 135–145)
Total Bilirubin: 0.9 mg/dL (ref 0.3–1.2)
Total Protein: 6.4 g/dL — ABNORMAL LOW (ref 6.5–8.1)

## 2018-08-20 LAB — MAGNESIUM: Magnesium: 2.1 mg/dL (ref 1.7–2.4)

## 2018-08-20 LAB — BRAIN NATRIURETIC PEPTIDE: B Natriuretic Peptide: 315 pg/mL — ABNORMAL HIGH (ref 0.0–100.0)

## 2018-08-20 MED ORDER — FUROSEMIDE 10 MG/ML IJ SOLN
40.0000 mg | Freq: Every day | INTRAMUSCULAR | Status: DC
  Administered 2018-08-20 – 2018-08-21 (×2): 40 mg via INTRAVENOUS
  Filled 2018-08-20 (×2): qty 4

## 2018-08-20 MED ORDER — WARFARIN - PHARMACIST DOSING INPATIENT
Freq: Every day | Status: DC
  Administered 2018-08-20: 18:00:00

## 2018-08-20 MED ORDER — WARFARIN SODIUM 7.5 MG PO TABS
10.7500 mg | ORAL_TABLET | Freq: Once | ORAL | Status: AC
  Administered 2018-08-20: 10.75 mg via ORAL
  Filled 2018-08-20: qty 1

## 2018-08-20 NOTE — Progress Notes (Signed)
ANTICOAGULATION CONSULT NOTE - Initial Consult  Pharmacy Consult for Coumadin Indication: atrial fibrillation  No Known Allergies  Patient Measurements: Height: _0  (165.1 cm) Weight: 203 lb 14.8 oz (92.5 kg) IBW/kg (Calculated) : 61.5  Vital Signs: Temp: 98.1 F (36.7 C) (05/31 0813) Temp Source: Oral (05/31 0813) BP: 125/45 (05/31 0700) Pulse Rate: 67 (05/31 0700)  Labs: Recent Labs    08/19/18 0550 08/19/18 0843 08/19/18 1455 08/19/18 1956 08/20/18 0404  HGB 12.1*  --   --   --  10.7*  HCT 38.2*  --   --   --  34.1*  PLT 267  --   --   --  247  LABPROT 34.3*  --   --   --   --   INR 3.5*  --   --   --   --   CREATININE 1.16  --   --   --  1.08  TROPONINI <0.03 0.04* 0.05* 0.04*  --     Estimated Creatinine Clearance: 54.2 mL/min (by C-G formula based on SCr of 1.08 mg/dL).   Medical History: Past Medical History:  Diagnosis Date  . Atrial flutter ([place])   . BPH (benign prostatic hyperplasia)   . Coronary artery disease    a. s/p MI tx with Cypher DES to pRCA in 4/04;  b. Echocardiogram 7/09: Normal LV function.  c. Nuclear study 3/13 no ischemia;  d. ETT 2/14 neg;  e. admx with CP => LHC (01/30/2013):  pLAD 40-50%, oD1 70-80%, oOM1 40%, pRCA stent patent, mid RCA 30%, EF 60-65%. => med Rx.  . DDD (degenerative disc disease)   . Diabetes mellitus without complication ([place])   . Dyslipidemia   . Gait disorder 07/13/2018  . Hemorrhoids   . Hx MRSA infection    left buttocks abscess  . Hx of echocardiogram    a. Echocardiogram (01/31/2013): Mild focal basal and mild concentric hypertrophy of the septum, EF 50-55%, normal wall motion, grade 1 diastolic dysfunction, trivial AI, MAC, mild LAE, PASP 35  . Hyperlipidemia   . Hypertension   . Hypothyroidism   . Meniere's disease    Status post shunt  . Myocardial infarction ([place]) 2008  . Occlusion and stenosis of carotid artery without mention of cerebral infarction    40-59% on carotid doppler 2014; [place]  (01/2013): R 1-39%, L 60-79%  . Rotator cuff injury    chronic rotator cuff injury status post repair  . Seizure disorder ([place])   . Stroke [facility])    a. 01/2013=> post cardiac cath CVA to L post communicating artery system; R sided weakness  . Syncope     Medications:  Medications Prior to Admission  Medication Sig Dispense Refill Last Dose  . acetaminophen (TYLENOL) 325 MG tablet Take 1-2 tablets (325-650 mg total) by mouth every 4 (four) hours as needed for mild pain.   Taking  . albuterol (PROVENTIL HFA;VENTOLIN HFA) 108 (90 Base) MCG/ACT inhaler Inhale 2 puffs into the lungs every 4 (four) hours as needed for wheezing or shortness of breath. 1 Inhaler 0 Past Week at Unknown time  . apixaban (ELIQUIS) 5 MG TABS tablet Take 1 tablet (5 mg total) by mouth 2 (two) times daily. 60 tablet 5   . atorvastatin (LIPITOR) 80 MG tablet Take 1 tablet (80 mg total) by mouth daily at 6 PM. 90 tablet 1 Past Week at Unknown time  . carbamazepine (CARBATROL) 300 MG 12 hr capsule Take 1 capsule (300 mg total) by mouth  2 (two) times daily. 30 capsule 0 08/18/2018 at Unknown time  . cloNIDine (CATAPRES) 0.2 MG tablet Take 1 tablet (0.2 mg total) by mouth 2 (two) times daily. 60 tablet 1 08/18/2018 at Unknown time  . escitalopram (LEXAPRO) 5 MG tablet TAKE 1 TABLET BY MOUTH EVERYDAY AT BEDTIME 90 tablet 1 08/18/2018 at Unknown time  . isosorbide mononitrate (IMDUR) 30 MG 24 hr tablet Take 1 tablet (30 mg total) by mouth daily. 90 tablet 3 08/18/2018 at Unknown time  . levETIRAcetam (KEPPRA) 250 MG tablet Take 1 tablet (250 mg total) by mouth 2 (two) times daily. 60 tablet 3 08/18/2018 at Unknown time  . levothyroxine (SYNTHROID, LEVOTHROID) 75 MCG tablet TAKE 1 TABLET BY MOUTH EVERY DAY (Patient taking differently: Take 75 mcg by mouth daily. ) 90 tablet 3 08/18/2018 at Unknown time  . losartan (COZAAR) 100 MG tablet Take 0.5 tablets (50 mg total) by mouth 2 (two) times daily. 60 tablet 1 08/18/2018 at Unknown time  .  metFORMIN (GLUCOPHAGE) 500 MG tablet TAKE 1/2 TABLET (250 MG) BY MOUTH 2 TIMES DAILY WITH A MEAL. 30 tablet 5 08/18/2018 at Unknown time  . metoprolol tartrate (LOPRESSOR) 100 MG tablet Take 1 tablet (100 mg total) by mouth 2 (two) times daily. 60 tablet 1 08/18/2018 at 900  . Multiple Vitamins-Minerals (MULTIVITAMINS THER. W/MINERALS) TABS Take 1 tablet by mouth at bedtime.     08/18/2018 at Unknown time  . warfarin (COUMADIN) 7.5 MG tablet TAKE 1&1/2 TABS TUESDAY, THURSDAY & SATURDAY*1 TAB ON SUNDAY, MONDAY, WEDNESDAY & FRIDAY WITH SUPPER 240 tablet 1 08/17/2018 at 2100    Assessment: 85 yo male admitted for             . Recently, patient switched from Coumadin 10.2m daily to eliquis 533mBID (5/28 started). With the drug-drug interaction of Carbamazepine and eliquis, it is not recommended to use eliquis. Carbamazepine is a strong inducer of CYP3A4 Pdue to the high inducibility of metabolism caused by carbamazepine ca lead to decreased exposure to apixaban and may decrease efficacy. Reviewed with MD and will transition back to Coumadin. Last dose of eliquis given 5/30 2200. Will restart Coumadin this afternoon. (INR may have inconsistent results d/t eliquis and be falsely elevated)  Home dose was 10.7558maily   Goal of Therapy:  INR 2-3 Monitor platelets by anticoagulation protocol: Yes   Plan:  Coumadin 10.91m68m x 1 PT-INR daily Monitor for S/S of bleeding  LoriIsac Sarna PharVena [place]PS Clinical Pharmacist Pager #[phone]/2020,10:07 AM

## 2018-08-20 NOTE — Progress Notes (Addendum)
PROGRESS NOTE  [name]  XVQ:[id number]  DOB: 08-10-1933  DOA: 08/19/2018 PCP: [name], MD   Brief Admission Hx: 85 year old gentleman with A. fib/a flutter, hypertension, CAD, DM, diastolic CHF, history of MI and CVA was recently discharged from [facility] presented with shortness of breath and chest pain.  MDM/Assessment & Plan:   1. Acute diastolic CHF exacerbation- suspect was secondary to hypertensive urgency-patient is responding well to IV Lasix and has diuresed nearly 3 L since admission.  He is feeling a lot better today.  His weight is trending down.  His electrolytes are stable.  Transfer to telemetry. 2. Atrial fibrillation/atrial flutter- continue metoprolol for rate control.  Unfortunately there is a drug interaction with apixaban and his carbamazepine and pharmacy says that they cannot be taken together.  We will have to start him back on his warfarin for safety reasons.  I explained that to the patient and he verbalized understanding. 3. Right arm pain - s/p recent fall, will obtain xrays to rule out fracture.  4. Chest pain- his symptoms are related to CHF exacerbation.  His mild troponin elevation thought to be secondary to CHF exacerbation.  His symptoms have resolved now. 5. Leukocytosis- he is being treated for pneumonia.  His WBC has normalized. 6. H CAP- MRSA negative will DC vancomycin.  Plan to further de-escalate antibiotics tomorrow. 7. Hypertensive urgency - blood pressures are controlled now.  8. Type 2 diabetes mellitus-continue supplemental sliding scale insulin, prandial insulin and holding home metformin. 9. Hypothyroidism-stable, resume home levothyroxine. 10. Dyslipidemia-resume home medications. 11. Recurrent syncopal episode/possible NPH-he is being followed by his neurologist and cardiologist and planning to be evaluated in [place] for evaluation of NPH.  DVT prophylaxis: Warfarin Code Status: Full Family Communication: Daughter  telephone Disposition Plan: Transfer to telemetry   Consultants:    Procedures:    Antimicrobials:  Cefepime 5/30-  Vancomycin 5/30-5/31  Subjective: The patient reports that he is feeling a lot better today.  He is having right arm pain from fall.  He is not having chest pain.  He is not ambulated since arrival.  Objective: Vitals:   08/20/18 0400 08/20/18 0600 08/20/18 0700 08/20/18 0813  BP:   (!) 125/45   Pulse:   67   Resp:   (!) 21   Temp: 98.3 F (36.8 C)   98.1 F (36.7 C)  TempSrc: Oral   Oral  SpO2:   93%   Weight:  92.5 kg    Height:        Intake/Output Summary (Last 24 hours) at 08/20/2018 1115 Last data filed at 08/20/2018 0932 Gross per 24 hour  Intake 835.01 ml  Output 2100 ml  Net -1264.99 ml   Filed Weights   08/19/18 0548 08/20/18 0600  Weight: 98 kg 92.5 kg   REVIEW OF SYSTEMS  As per history otherwise all reviewed and reported negative  Exam:  General exam: Awake, alert sitting up in bed.  In no apparent distress. Respiratory system: Fine bibasilar crackles. No increased work of breathing. Cardiovascular system: S1 & S2 heard. No JVD and trace pedal edema. Gastrointestinal system: Abdomen is nondistended, soft and nontender. Normal bowel sounds heard. Central nervous system: Alert and oriented. No focal neurological deficits. Extremities: Trace pretibial edema bilateral lower extremities.  Data Reviewed: Basic Metabolic Panel: Recent Labs  Lab 08/19/18 0550 08/20/18 0404  NA 139 137  K 4.2 3.8  CL 100 99  CO2 24 26  GLUCOSE 133* 123*  BUN  24* 27*  CREATININE 1.16 1.08  CALCIUM 9.2 8.6*  MG  --  2.1   Liver Function Tests: Recent Labs  Lab 08/20/18 0404  AST 17  ALT 17  ALKPHOS 64  BILITOT 0.9  PROT 6.4*  ALBUMIN 3.4*   No results for input(s): LIPASE, AMYLASE in the last 168 hours. No results for input(s): AMMONIA in the last 168 hours. CBC: Recent Labs  Lab 08/19/18 0550 08/20/18 0404  WBC 15.7* 9.7   NEUTROABS 11.7* 5.7  HGB 12.1* 10.7*  HCT 38.2* 34.1*  MCV 94.3 94.5  PLT 267 247   Cardiac Enzymes: Recent Labs  Lab 08/19/18 0550 08/19/18 0843 08/19/18 1455 08/19/18 1956  TROPONINI <0.03 0.04* 0.05* 0.04*   CBG (last 3)  Recent Labs    08/19/18 2141 08/20/18 0359 08/20/18 0743  GLUCAP 128* 124* 115*   Recent Results (from the past 240 hour(s))  SARS Coronavirus 2 (CEPHEID - Performed in [place] hospital lab), Hosp Order     Status: None   Collection Time: 08/19/18  6:00 AM  Result Value Ref Range Status   SARS Coronavirus 2 NEGATIVE NEGATIVE Final    Comment: (NOTE) If result is NEGATIVE SARS-CoV-2 target nucleic acids are NOT DETECTED. The SARS-CoV-2 RNA is generally detectable in upper and lower  respiratory specimens during the acute phase of infection. The lowest  concentration of SARS-CoV-2 viral copies this assay can detect is 250  copies / mL. A negative result does not preclude SARS-CoV-2 infection  and should not be used as the sole basis for treatment or other  patient management decisions.  A negative result may occur with  improper specimen collection / handling, submission of specimen other  than nasopharyngeal swab, presence of viral mutation(s) within the  areas targeted by this assay, and inadequate number of viral copies  (<250 copies / mL). A negative result must be combined with clinical  observations, patient history, and epidemiological information. If result is POSITIVE SARS-CoV-2 target nucleic acids are DETECTED. The SARS-CoV-2 RNA is generally detectable in upper and lower  respiratory specimens dur ing the acute phase of infection.  Positive  results are indicative of active infection with SARS-CoV-2.  Clinical  correlation with patient history and other diagnostic information is  necessary to determine patient infection status.  Positive results do  not rule out bacterial infection or co-infection with other viruses. If result is  PRESUMPTIVE POSTIVE SARS-CoV-2 nucleic acids MAY BE PRESENT.   A presumptive positive result was obtained on the submitted specimen  and confirmed on repeat testing.  While 2019 novel coronavirus  (SARS-CoV-2) nucleic acids may be present in the submitted sample  additional confirmatory testing may be necessary for epidemiological  and / or clinical management purposes  to differentiate between  SARS-CoV-2 and other Sarbecovirus currently known to infect humans.  If clinically indicated additional testing with an alternate test  methodology [phone]) is advised. The SARS-CoV-2 RNA is generally  detectable in upper and lower respiratory sp ecimens during the acute  phase of infection. The expected result is Negative. Fact Sheet for Patients:  StrictlyIdeas.no Fact Sheet for Healthcare Providers: BankingDealers.co.za This test is not yet approved or cleared by the [place] FDA and has been authorized for detection and/or diagnosis of SARS-CoV-2 by FDA under an Emergency Use Authorization (EUA).  This EUA will remain in effect (meaning this test can be used) for the duration of the COVID-19 declaration under Section 564(b)(1) of the Act, 21 U.S.C. section 360bbb-3(b)(1),  unless the authorization is terminated or revoked sooner. Performed at [facility], [address], [place], [place] [address]   MRSA PCR Screening     Status: None   Collection Time: 08/19/18  8:40 AM  Result Value Ref Range Status   MRSA by PCR NEGATIVE NEGATIVE Final    Comment:        The GeneXpert MRSA Assay (FDA approved for NASAL specimens only), is one component of a comprehensive MRSA colonization surveillance program. It is not intended to diagnose MRSA infection nor to guide or monitor treatment for MRSA infections. Performed at [facility], [address], [place], [place] [address]      Studies: Dg Chest [facility] 1 View  Result Date: 08/19/2018  CLINICAL DATA:  Shortness of breath EXAM: PORTABLE CHEST 1 VIEW COMPARISON:  05/29/2018 FINDINGS: Low lung volumes with vascular crowding. Mild interstitial edema is difficult to exclude. Mild left basilar opacity, likely atelectasis. No pneumothorax. Heart is top-normal in size for inspiration. IMPRESSION: Low lung volumes with vascular crowding. Possible mild interstitial edema. Mild left basilar opacity, likely atelectasis. Electronically Signed   By: Julian Hy M.D.   On: 08/19/2018 06:15     Scheduled Meds: . atorvastatin  80 mg Oral q1800  . carbamazepine  300 mg Oral BID  . cloNIDine  0.2 mg Oral BID  . escitalopram  5 mg Oral Daily  . furosemide  40 mg Intravenous Daily  . insulin aspart  0-5 Units Subcutaneous QHS  . insulin aspart  0-9 Units Subcutaneous TID WC  . insulin aspart  2 Units Subcutaneous TID WC  . isosorbide mononitrate  30 mg Oral Daily  . levETIRAcetam  250 mg Oral BID  . levothyroxine  75 mcg Oral QAC breakfast  . Living Better with Heart Failure Book   Does not apply Once  . losartan  50 mg Oral BID  . metoprolol tartrate  100 mg Oral BID  . multivitamin with minerals  1 tablet Oral QHS  . sodium chloride flush  3 mL Intravenous Q12H  . warfarin  10.75 mg Oral Once  . Warfarin - Pharmacist Dosing Inpatient   Does not apply q1800   Continuous Infusions: . sodium chloride 250 mL (08/20/18 1047)  . ceFEPime (MAXIPIME) IV 200 mL/hr at 08/20/18 1048    Principal Problem:   Acute exacerbation of CHF (congestive heart failure) (HCC) Active Problems:   Dyslipidemia   HTN (hypertension)   Bilateral carotid artery disease (HCC)   Chest pain   Pulmonary edema   Paroxysmal atrial fibrillation (HCC)   Hypothyroidism   Controlled type 2 diabetes mellitus with complication, without long-term current use of insulin (HCC)   Leucocytosis   Recurrent syncope   Hypertensive urgency  Critical careTime spent: 30 mins  Irwin Brakeman, MD [facility]  08/20/2018, 11:15 AM    LOS: 1 day  How to contact the [facility] Attending or Consulting provider [place] or covering provider during after hours [place], for this patient?  1. Check the care team in [facility] and look for a) attending/consulting TRH provider listed and b) the [facility] team listed 2. Log into [email or web address] and use [place]'s universal password to access. If you do not have the password, please contact the hospital operator. 3. Locate the [facility] provider you are looking for under [facility] and page to a number that you can be directly reached. 4. If you still have difficulty reaching the provider, please page the [facility] (Director on Call)  for the Hospitalists listed on amion for assistance.

## 2018-08-20 NOTE — Evaluation (Signed)
Physical Therapy Evaluation Patient Details Name: [name] MRN: [id number] DOB: July 09, 1933 Today's Date: 08/20/2018   History of Present Illness  [name] is a 85 y.o. male with Afib/Aflutter, hypertension, CAD, DM, diastolic CHF, history of MI and CVA recently discharged from [facility] after a hospitalization at [facility] for recurrent syncopal episodes who presented to ED after falling out of bed and having shortness of breath and chest pain.  He reportedly did not have a hard fall. He slid out of the bed onto the floor.   He did not hit his head.  He says that he started having shortness of breath after waking up this morning.  He says that he has been feeling well until this time since being discharged from [facility].  He is being worked up by his neurologist for recurrent syncopal episodes.  He has been wearing a heart monitor.  He has an appointment in [place] for an evaluation for a suspected NPH.  He does report that he has had a mild cough and denies having fever.  He has having right chest wall pain.     Clinical Impression  Patient able to weight bear on RUE without pain remaining at baseline, had difficulty for sitting up at bedside mostly due to air mattress and slightly limited use of RUE which is baseline per patient secondary weakness from previous stroke.  Patient ambulated in hallway without loss of balance leaning on RW without c/o increased pain in RUE.  Patient tolerated sitting up in chair after therapy - RN aware.  Patient will benefit from continued physical therapy in hospital and recommended venue below to increase strength, balance, endurance for safe ADLs and gait.    Follow Up Recommendations Home health PT;Supervision - Intermittent;Supervision for mobility/OOB    Equipment Recommendations       Recommendations for Other Services       Precautions / Restrictions Precautions Precautions: Fall Restrictions Weight Bearing Restrictions: No      Mobility  Bed Mobility Overal bed mobility: Needs Assistance Bed Mobility: Supine to Sit     Supine to sit: Min assist     General bed mobility comments: increased time, labored movement, slightly limited use of RUE for bed mobility  Transfers Overall transfer level: Needs assistance Equipment used: Rolling walker (2 wheeled) Transfers: Sit to/from [facility] Sit to Stand: Supervision Stand pivot transfers: Supervision;Min guard       General transfer comment: slightly labored movement, diffiuclty for stand to sitting due to dropping into lounge chair  Ambulation/Gait Ambulation/Gait assistance: Supervision Gait Distance (Feet): 80 Feet Assistive device: Rolling walker (2 wheeled) Gait Pattern/deviations: Decreased step length - right;Decreased step length - left;Decreased stride length Gait velocity: decreased   General Gait Details: slightly labored cadence without loss of balance, on room air with SpO2 AT 90-92% after ambulation  Stairs            Wheelchair Mobility    Modified Rankin (Stroke Patients Only)       Balance Overall balance assessment: Needs assistance Sitting-balance support: Feet supported;No upper extremity supported Sitting balance-Leahy Scale: Fair     Standing balance support: Bilateral upper extremity supported;During functional activity Standing balance-Leahy Scale: Fair Standing balance comment: using RW                             Pertinent Vitals/Pain Pain Assessment: 0-10 Pain Score: 7  Pain Location: 6-7/10 RUE mostly in triceps  area Pain Descriptors / Indicators: Sore Pain Intervention(s): Limited activity within patient's tolerance;Monitored during session    Home Living Family/patient expects to be discharged to:: Private residence Living Arrangements: Spouse/significant other Available Help at Discharge: Family;Available 24 hours/day Type of Home: House Home Access: Stairs to enter Entrance  Stairs-Rails: Right;Left;Can reach both Entrance Stairs-Number of Steps: 1-2 Home Layout: One level Home Equipment: Walker - 2 wheels;Walker - 4 wheels;Cane - single point;Shower seat      Prior Function Level of Independence: Independent with assistive device(s)         Comments: household ambulator with RW     Hand Dominance   Dominant Hand: Right    Extremity/Trunk Assessment   Upper Extremity Assessment Upper Extremity Assessment: Defer to OT evaluation    Lower Extremity Assessment Lower Extremity Assessment: Generalized weakness    Cervical / Trunk Assessment Cervical / Trunk Assessment: Normal  Communication   Communication: No difficulties  Cognition Arousal/Alertness: Awake/alert Behavior During Therapy: WFL for tasks assessed/performed Overall Cognitive Status: Within Functional Limits for tasks assessed                                        General Comments      Exercises     Assessment/Plan    PT Assessment Patient needs continued PT services  PT Problem List Decreased strength;Decreased activity tolerance;Decreased balance;Decreased mobility       PT Treatment Interventions Therapeutic exercise;Gait training;Stair training;Functional mobility training;Therapeutic activities;Patient/family education    PT Goals (Current goals can be found in the Care Plan section)  Acute Rehab PT Goals Patient Stated Goal: return home PT Goal Formulation: With patient Time For Goal Achievement: 08/27/18 Potential to Achieve Goals: Good    Frequency Min 3X/week   Barriers to discharge        Co-evaluation               AM-PAC PT "6 Clicks" Mobility  Outcome Measure Help needed turning from your back to your side while in a flat bed without using bedrails?: A Little Help needed moving from lying on your back to sitting on the side of a flat bed without using bedrails?: A Little Help needed moving to and from a bed to a chair  (including a wheelchair)?: A Little Help needed standing up from a chair using your arms (e.g., wheelchair or bedside chair)?: None Help needed to walk in hospital room?: A Little Help needed climbing 3-5 steps with a railing? : A Little 6 Click Score: 19    End of Session   Activity Tolerance: Patient tolerated treatment well;Patient limited by fatigue Patient left: in chair;with call bell/phone within reach Nurse Communication: Mobility status PT Visit Diagnosis: Unsteadiness on feet (R26.81);Other abnormalities of gait and mobility (R26.89);Muscle weakness (generalized) (M62.81)    Time: 0981-1914 PT Time Calculation (min) (ACUTE ONLY): 28 min   Charges:   PT Evaluation $PT Eval Moderate Complexity: 1 Mod PT Treatments $Therapeutic Activity: 23-37 mins        10:50 AM, 08/20/18 Lonell Grandchild, MPT Physical Therapist with [facility] [phone] office [phone] mobile phone

## 2018-08-20 NOTE — Plan of Care (Signed)
  Problem: Acute Rehab PT Goals(only PT should resolve) Goal: Pt Will Go Supine/Side To Sit Outcome: Progressing Flowsheets (Taken 08/20/2018 1052) Pt will go Supine/Side to Sit: with supervision Goal: Patient Will Transfer Sit To/From Stand Outcome: Progressing Flowsheets (Taken 08/20/2018 1052) Patient will transfer sit to/from stand: with modified independence Goal: Pt Will Transfer Bed To Chair/Chair To Bed Outcome: Progressing Flowsheets (Taken 08/20/2018 1052) Pt will Transfer Bed to Chair/Chair to Bed: with modified independence Goal: Pt Will Ambulate Outcome: Progressing Flowsheets (Taken 08/20/2018 1052) Pt will Ambulate: > 125 feet; with modified independence; with rolling walker   10:54 AM, 08/20/18 Lonell Grandchild, MPT Physical Therapist with [facility] [phone] office [phone] mobile phone

## 2018-08-21 LAB — CBC WITH DIFFERENTIAL/PLATELET
Abs Immature Granulocytes: 0.05 10*3/uL (ref 0.00–0.07)
Basophils Absolute: 0.1 10*3/uL (ref 0.0–0.1)
Basophils Relative: 1 %
Eosinophils Absolute: 0.5 10*3/uL (ref 0.0–0.5)
Eosinophils Relative: 4 %
HCT: 32.8 % — ABNORMAL LOW (ref 39.0–52.0)
Hemoglobin: 10.7 g/dL — ABNORMAL LOW (ref 13.0–17.0)
Immature Granulocytes: 1 %
Lymphocytes Relative: 23 %
Lymphs Abs: 2.5 10*3/uL (ref 0.7–4.0)
MCH: 30.5 pg (ref 26.0–34.0)
MCHC: 32.6 g/dL (ref 30.0–36.0)
MCV: 93.4 fL (ref 80.0–100.0)
Monocytes Absolute: 1.3 10*3/uL — ABNORMAL HIGH (ref 0.1–1.0)
Monocytes Relative: 13 %
Neutro Abs: 6.2 10*3/uL (ref 1.7–7.7)
Neutrophils Relative %: 58 %
Platelets: 244 10*3/uL (ref 150–400)
RBC: 3.51 MIL/uL — ABNORMAL LOW (ref 4.22–5.81)
RDW: 15.3 % (ref 11.5–15.5)
WBC: 10.6 10*3/uL — ABNORMAL HIGH (ref 4.0–10.5)
nRBC: 0 % (ref 0.0–0.2)

## 2018-08-21 LAB — GLUCOSE, CAPILLARY
Glucose-Capillary: 117 mg/dL — ABNORMAL HIGH (ref 70–99)
Glucose-Capillary: 120 mg/dL — ABNORMAL HIGH (ref 70–99)
Glucose-Capillary: 149 mg/dL — ABNORMAL HIGH (ref 70–99)
Glucose-Capillary: 122 mg/dL — ABNORMAL HIGH (ref 70–99)

## 2018-08-21 LAB — COMPREHENSIVE METABOLIC PANEL
ALT: 16 U/L (ref 0–44)
AST: 17 U/L (ref 15–41)
Albumin: 3.2 g/dL — ABNORMAL LOW (ref 3.5–5.0)
Alkaline Phosphatase: 59 U/L (ref 38–126)
Anion gap: 12 (ref 5–15)
BUN: 27 mg/dL — ABNORMAL HIGH (ref 8–23)
CO2: 26 mmol/L (ref 22–32)
Calcium: 8.5 mg/dL — ABNORMAL LOW (ref 8.9–10.3)
Chloride: 99 mmol/L (ref 98–111)
Creatinine, Ser: 1.07 mg/dL (ref 0.61–1.24)
GFR calc Af Amer: >60 mL/min (ref >60)
GFR calc non Af Amer: >60 mL/min (ref >60)
Glucose, Bld: 136 mg/dL — ABNORMAL HIGH (ref 70–99)
Potassium: 3.7 mmol/L (ref 3.5–5.1)
Sodium: 137 mmol/L (ref 135–145)
Total Bilirubin: 0.7 mg/dL (ref 0.3–1.2)
Total Protein: 6.2 g/dL — ABNORMAL LOW (ref 6.5–8.1)

## 2018-08-21 LAB — PROTIME-INR
INR: 1.6 — ABNORMAL HIGH (ref 0.8–1.2)
Prothrombin Time: 18.7 seconds — ABNORMAL HIGH (ref 11.4–15.2)

## 2018-08-21 LAB — MAGNESIUM: Magnesium: 2.2 mg/dL (ref 1.7–2.4)

## 2018-08-21 MED ORDER — WARFARIN SODIUM 7.5 MG PO TABS
11.2500 mg | ORAL_TABLET | Freq: Once | ORAL | Status: DC
  Filled 2018-08-21: qty 1.5

## 2018-08-21 MED ORDER — FUROSEMIDE 20 MG PO TABS: 10.0000 mg | ORAL_TABLET | ORAL | 0 refills | Status: DC

## 2018-08-21 MED ORDER — DOXYCYCLINE HYCLATE 100 MG PO CAPS: 100.0000 mg | ORAL_CAPSULE | Freq: Two times a day (BID) | ORAL | 0 refills | Status: AC

## 2018-08-21 MED ORDER — WARFARIN SODIUM 7.5 MG PO TABS
10.7500 mg | ORAL_TABLET | Freq: Once | ORAL | Status: DC
  Filled 2018-08-21: qty 1

## 2018-08-21 NOTE — Progress Notes (Signed)
OT Cancellation Note  Patient Details Name: [name] MRN: [id number] DOB: [birth year]   Cancelled Treatment:    Reason Eval/Treat Not Completed: OT screened, no needs identified, will sign off. Met with patient this AM while he was seated on EOB to eat breakfast. Patient reports that he lives with his wife and he just finished rehab at [facility] in addition to [facility] therapy. He is independent with assistive devices at home. He is able to complete his bathing and dressing without assistance. He has all the necessary DME required. He is not interested in follow OT services and feels safe to return home with his wife. No further OT needs at this time. Will sign off.    Ailene Ravel, OTR/L,CBIS  [phone]  08/21/2018, 8:34 AM

## 2018-08-21 NOTE — TOC Transition Note (Signed)
Transition of Care [facility]) - CM/SW Discharge Note   Patient Details  Name: [name] MRN: [id number] Date of Birth: 01-03-1934  Transition of Care [facility]) CM/SW Contact:  Shade Flood, LCSW Phone Number: 08/21/2018, 10:10 AM   Clinical Narrative:     Pt stable for dc today per MD. PT had recommended [facility]. Spoke with pt to assess. Pt states that he is returning home at dc. Discussed HH PT with pt who states that he is not interested in HHPT. Pt states he just finished with 6-8 weeks of HHPT about two weeks ago and he does not want to go through that again. Pt aware that if he changes his mind, TOC will arrange. Pt is [facility] so will refer to them for care management since pt is high risk for readmission. Will update MD.  There are no other TOC needs for dc.  Final next level of care: Home/Self Care Barriers to Discharge: No Barriers Identified   Patient Goals and CMS Choice        Discharge Placement                       Discharge Plan and Services                                     Social Determinants of Health (SDOH) Interventions     Readmission Risk Interventions Readmission Risk Prevention Plan 08/21/2018  Transportation Screening Complete  PCP or Specialist Appt within 3-5 Days Complete  HRI or [place] Patient refused  Social Work Consult for [place] Planning/Counseling Complete  Palliative Care Screening Not Applicable  Medication Review [employer]) Complete  Some recent data might be hidden

## 2018-08-21 NOTE — Discharge Summary (Signed)
Physician Discharge Summary  [name] AYT:[id number] DOB: May 23, 1933 DOA: 08/19/2018  PCP: [name], MD Neurologist: Jannifer Franklin  Admit date: 08/19/2018 Discharge date: 08/21/2018  Admitted From:  Home  Disposition: Home   Recommendations for Outpatient Follow-up:  1. Follow up with PCP in 3 days for PT/INR check.  2. Please follow up with neurologists as scheduled.   Home Health:  PT DECLINED [place]  Discharge Condition: STABLE   CODE STATUS: FULL    Brief Hospitalization Summary: Please see all hospital notes, images, labs for full details of the hospitalization. HPI: [name] is a 85 y.o. male with Afib/Aflutter, hypertension, CAD, DM, diastolic CHF, history of MI and CVA recently discharged from [place] after a hospitalization at [facility] for recurrent syncopal episodes who presented to ED after falling out of bed and having shortness of breath and chest pain.  He reportedly did not have a hard fall. He slid out of the bed onto the floor.   He did not hit his head.  He says that he started having shortness of breath after waking up this morning.  He says that he has been feeling well until this time since being discharged from [facility].  He is being worked up by his neurologist for recurrent syncopal episodes.  He has been wearing a heart monitor.  He has an appointment in [place] for an evaluation for a suspected NPH.  He does report that he has had a mild cough and denies having fever.  He has having right chest wall pain.     ED Course:  Pt was noted to be hypertensive and was tachypneic. He had a CXR that showed interstitial edema.  He was started on lasix.  He was started on a nitroglycerin paste. His COVID testing has been negative.  His initial troponin has been less than 0.03.  He is being admitted for further evaluation and management.    Brief Admission Hx: 85 year old gentleman with A. fib/a flutter, hypertension, CAD, DM, diastolic CHF, history of MI and CVA  was recently discharged from [place] presented with shortness of breath and chest pain.  MDM/Assessment & Plan:   1. Acute diastolic CHF exacerbation- suspect was secondary to hypertensive urgency-patient is responding well to IV Lasix and has diuresed nearly 3 L since admission.  He is feeling a lot better today.  His weight is trending down.  His electrolytes are stable.  Transfer to telemetry. 2. Atrial fibrillation/atrial flutter- continue metoprolol for rate control.  Unfortunately there is a drug interaction with apixaban and his carbamazepine and pharmacy says that they cannot be taken together.  We have started him back on his warfarin for safety reasons.  I explained that to the patient and he verbalized understanding.  PT TOLD TO SEE PCP FOR PT/INR CHECK IN 3 DAYS. ALSO DISCUSSED WITH DAUGHTER.  3. Right arm pain - s/p recent fall,  xrays NEGATIVE for fracture but suggest chronic rotator cuff tear.  4. Chest pain- his symptoms were related to CHF exacerbation.  His mild troponin elevation thought to be secondary to CHF exacerbation.  His symptoms have resolved now. 5. Leukocytosis- he is being treated for pneumonia.  His WBC has normalized. 6. H CAP- MRSA negative will DC vancomycin.  discharge on 3 more days of oral doxycycline. 7. Hypertensive urgency - blood pressures are controlled now.  8. Type 2 diabetes mellitus-continue supplemental sliding scale insulin, prandial insulin and holding home metformin. 9. Hypothyroidism-stable, resume home levothyroxine. 10. Dyslipidemia-resume  home medications. 11. Recurrent syncopal episode/possible NPH-he is being followed by his neurologist and cardiologist and planning to be evaluated in [place] for evaluation of NPH.  DVT prophylaxis: Warfarin Code Status: Full Family Communication: Daughter telephone Disposition Plan: Home    Consultants:    Procedures:    Antimicrobials:  Cefepime 5/30-  Vancomycin  5/30-5/31  Discharge Diagnoses:  Principal Problem:   Acute exacerbation of CHF (congestive heart failure) (HCC) Active Problems:   Dyslipidemia   HTN (hypertension)   Bilateral carotid artery disease (HCC)   Chest pain   Pulmonary edema   Paroxysmal atrial fibrillation (HCC)   Hypothyroidism   Controlled type 2 diabetes mellitus with complication, without long-term current use of insulin (HCC)   Leucocytosis   Recurrent syncope   Hypertensive urgency   Discharge Instructions: Discharge Instructions    (HEART FAILURE PATIENTS) Call MD:  Anytime you have any of the following symptoms: 1) 3 pound weight gain in 24 hours or 5 pounds in 1 week 2) shortness of breath, with or without a dry hacking cough 3) swelling in the hands, feet or stomach 4) if you have to sleep on extra pillows at night in order to breathe.   Complete by:  As directed    Call MD for:  difficulty breathing, headache or visual disturbances   Complete by:  As directed    Call MD for:  persistant dizziness or light-headedness   Complete by:  As directed    Call MD for:  persistant nausea and vomiting   Complete by:  As directed    Diet - low sodium heart healthy   Complete by:  As directed    Increase activity slowly   Complete by:  As directed      Allergies as of 08/21/2018   No Known Allergies     Medication List    STOP taking these medications   apixaban 5 MG Tabs tablet Commonly known as:  Eliquis     TAKE these medications   acetaminophen 325 MG tablet Commonly known as:  TYLENOL Take 1-2 tablets (325-650 mg total) by mouth every 4 (four) hours as needed for mild pain.   albuterol 108 (90 Base) MCG/ACT inhaler Commonly known as:  VENTOLIN HFA Inhale 2 puffs into the lungs every 4 (four) hours as needed for wheezing or shortness of breath.   atorvastatin 80 MG tablet Commonly known as:  LIPITOR Take 1 tablet (80 mg total) by mouth daily at 6 PM.   carbamazepine 300 MG 12 hr  capsule Commonly known as:  CARBATROL Take 1 capsule (300 mg total) by mouth 2 (two) times daily.   cloNIDine 0.2 MG tablet Commonly known as:  CATAPRES Take 1 tablet (0.2 mg total) by mouth 2 (two) times daily.   doxycycline 100 MG capsule Commonly known as:  VIBRAMYCIN Take 1 capsule (100 mg total) by mouth 2 (two) times daily for 3 days.   escitalopram 5 MG tablet Commonly known as:  LEXAPRO TAKE 1 TABLET BY MOUTH EVERYDAY AT BEDTIME   furosemide 20 MG tablet Commonly known as:  Lasix Take 0.5 tablets (10 mg total) by mouth every other day. Start taking on:  August 25, 2018   isosorbide mononitrate 30 MG 24 hr tablet Commonly known as:  IMDUR Take 1 tablet (30 mg total) by mouth daily.   levETIRAcetam 250 MG tablet Commonly known as:  Keppra Take 1 tablet (250 mg total) by mouth 2 (two) times daily.   levothyroxine  75 MCG tablet Commonly known as:  SYNTHROID TAKE 1 TABLET BY MOUTH EVERY DAY   losartan 100 MG tablet Commonly known as:  COZAAR Take 0.5 tablets (50 mg total) by mouth 2 (two) times daily.   metFORMIN 500 MG tablet Commonly known as:  GLUCOPHAGE TAKE 1/2 TABLET (250 MG) BY MOUTH 2 TIMES DAILY WITH A MEAL.   metoprolol tartrate 100 MG tablet Commonly known as:  LOPRESSOR Take 1 tablet (100 mg total) by mouth 2 (two) times daily.   multivitamins ther. w/minerals Tabs tablet Take 1 tablet by mouth at bedtime.   warfarin 7.5 MG tablet Commonly known as:  COUMADIN Take as directed. If you are unsure how to take this medication, talk to your nurse or doctor. Original instructions:  TAKE 1&1/2 TABS TUESDAY, THURSDAY & SATURDAY*1 TAB ON SUNDAY, MONDAY, [place] SUPPER      Follow-up Information    [name], MD. Schedule an appointment as soon as possible for a visit in 3 day(s).   Specialty:  Family Medicine Why:  Hospital Follow Up and check PT/INR Contact information: [place]  [id number] [phone]          No Known Allergies Allergies as of 08/21/2018   No Known Allergies     Medication List    STOP taking these medications   apixaban 5 MG Tabs tablet Commonly known as:  Eliquis     TAKE these medications   acetaminophen 325 MG tablet Commonly known as:  TYLENOL Take 1-2 tablets (325-650 mg total) by mouth every 4 (four) hours as needed for mild pain.   albuterol 108 (90 Base) MCG/ACT inhaler Commonly known as:  VENTOLIN HFA Inhale 2 puffs into the lungs every 4 (four) hours as needed for wheezing or shortness of breath.   atorvastatin 80 MG tablet Commonly known as:  LIPITOR Take 1 tablet (80 mg total) by mouth daily at 6 PM.   carbamazepine 300 MG 12 hr capsule Commonly known as:  CARBATROL Take 1 capsule (300 mg total) by mouth 2 (two) times daily.   cloNIDine 0.2 MG tablet Commonly known as:  CATAPRES Take 1 tablet (0.2 mg total) by mouth 2 (two) times daily.   doxycycline 100 MG capsule Commonly known as:  VIBRAMYCIN Take 1 capsule (100 mg total) by mouth 2 (two) times daily for 3 days.   escitalopram 5 MG tablet Commonly known as:  LEXAPRO TAKE 1 TABLET BY MOUTH EVERYDAY AT BEDTIME   furosemide 20 MG tablet Commonly known as:  Lasix Take 0.5 tablets (10 mg total) by mouth every other day. Start taking on:  August 25, 2018   isosorbide mononitrate 30 MG 24 hr tablet Commonly known as:  IMDUR Take 1 tablet (30 mg total) by mouth daily.   levETIRAcetam 250 MG tablet Commonly known as:  Keppra Take 1 tablet (250 mg total) by mouth 2 (two) times daily.   levothyroxine 75 MCG tablet Commonly known as:  SYNTHROID TAKE 1 TABLET BY MOUTH EVERY DAY   losartan 100 MG tablet Commonly known as:  COZAAR Take 0.5 tablets (50 mg total) by mouth 2 (two) times daily.   metFORMIN 500 MG tablet Commonly known as:  GLUCOPHAGE TAKE 1/2 TABLET (250 MG) BY MOUTH 2 TIMES DAILY WITH A MEAL.   metoprolol tartrate 100 MG tablet Commonly known  as:  LOPRESSOR Take 1 tablet (100 mg total) by mouth 2 (two) times daily.   multivitamins ther.  w/minerals Tabs tablet Take 1 tablet by mouth at bedtime.   warfarin 7.5 MG tablet Commonly known as:  COUMADIN Take as directed. If you are unsure how to take this medication, talk to your nurse or doctor. Original instructions:  TAKE 1&1/2 TABS TUESDAY, THURSDAY & SATURDAY*1 TAB ON SUNDAY, MONDAY, WEDNESDAY & FRIDAY WITH SUPPER       Procedures/Studies: Dg Chest Port 1 View  Result Date: 08/19/2018 CLINICAL DATA:  Shortness of breath EXAM: PORTABLE CHEST 1 VIEW COMPARISON:  05/29/2018 FINDINGS: Low lung volumes with vascular crowding. Mild interstitial edema is difficult to exclude. Mild left basilar opacity, likely atelectasis. No pneumothorax. Heart is top-normal in size for inspiration. IMPRESSION: Low lung volumes with vascular crowding. Possible mild interstitial edema. Mild left basilar opacity, likely atelectasis. Electronically Signed   By: Julian Hy M.D.   On: 08/19/2018 06:15   Dg Humerus Right  Result Date: 08/20/2018 CLINICAL DATA:  Right upper arm pain since a fall several days ago. Initial encounter. EXAM: RIGHT HUMERUS - 2+ VIEW COMPARISON:  None. FINDINGS: There is no acute bony or joint abnormality. Acromioclavicular osteoarthritis is noted. The humeral head is high-riding suggesting chronic rotator cuff tear. Soft tissues unremarkable. IMPRESSION: No acute abnormality. Acromioclavicular osteoarthritis. Findings suggestive of chronic rotator cuff tear. Electronically Signed   By: Inge Rise M.D.   On: 08/20/2018 12:31      Subjective:   Discharge Exam: Vitals:   08/20/18 2100 08/21/18 0603  BP: 123/70 (!) 140/59  Pulse: 80 63  Resp: 20 16  Temp: 98.8 F (37.1 C) 97.6 F (36.4 C)  SpO2: 94% 91%   Vitals:   08/20/18 2010 08/20/18 2100 08/21/18 0500 08/21/18 0603  BP:  123/70  (!) 140/59  Pulse:  80  63  Resp:  20  16  Temp:  98.8 F (37.1 C)  97.6  F (36.4 C)  TempSrc:  Oral  Oral  SpO2: 92% 94%  91%  Weight:   93.8 kg   Height:        General: Pt is alert, awake, not in acute distress Cardiovascular: RRR, S1/S2 +, no rubs, no gallops Respiratory: CTA bilaterally, no wheezing, no rhonchi Abdominal: Soft, NT, ND, bowel sounds + Extremities: no edema, no cyanosis   The results of significant diagnostics from this hospitalization (including imaging, microbiology, ancillary and laboratory) are listed below for reference.     Microbiology: Recent Results (from the past 240 hour(s))  SARS Coronavirus 2 (CEPHEID - Performed in [facility] hospital lab), Hosp Order     Status: None   Collection Time: 08/19/18  6:00 AM  Result Value Ref Range Status   SARS Coronavirus 2 NEGATIVE NEGATIVE Final    Comment: (NOTE) If result is NEGATIVE SARS-CoV-2 target nucleic acids are NOT DETECTED. The SARS-CoV-2 RNA is generally detectable in upper and lower  respiratory specimens during the acute phase of infection. The lowest  concentration of SARS-CoV-2 viral copies this assay can detect is 250  copies / mL. A negative result does not preclude SARS-CoV-2 infection  and should not be used as the sole basis for treatment or other  patient management decisions.  A negative result may occur with  improper specimen collection / handling, submission of specimen other  than nasopharyngeal swab, presence of viral mutation(s) within the  areas targeted by this assay, and inadequate number of viral copies  (<250 copies / mL). A negative result must be combined with clinical  observations, patient history, and epidemiological information.  If result is POSITIVE SARS-CoV-2 target nucleic acids are DETECTED. The SARS-CoV-2 RNA is generally detectable in upper and lower  respiratory specimens dur ing the acute phase of infection.  Positive  results are indicative of active infection with SARS-CoV-2.  Clinical  correlation with patient history and other  diagnostic information is  necessary to determine patient infection status.  Positive results do  not rule out bacterial infection or co-infection with other viruses. If result is PRESUMPTIVE POSTIVE SARS-CoV-2 nucleic acids MAY BE PRESENT.   A presumptive positive result was obtained on the submitted specimen  and confirmed on repeat testing.  While 2019 novel coronavirus  (SARS-CoV-2) nucleic acids may be present in the submitted sample  additional confirmatory testing may be necessary for epidemiological  and / or clinical management purposes  to differentiate between  SARS-CoV-2 and other Sarbecovirus currently known to infect humans.  If clinically indicated additional testing with an alternate test  methodology [id number]) is advised. The SARS-CoV-2 RNA is generally  detectable in upper and lower respiratory sp ecimens during the acute  phase of infection. The expected result is Negative. Fact Sheet for Patients:  StrictlyIdeas.no Fact Sheet for Healthcare Providers: BankingDealers.co.za This test is not yet approved or cleared by the [place] FDA and has been authorized for detection and/or diagnosis of SARS-CoV-2 by FDA under an Emergency Use Authorization (EUA).  This EUA will remain in effect (meaning this test can be used) for the duration of the COVID-19 declaration under Section 564(b)(1) of the Act, 21 U.S.C. section 360bbb-3(b)(1), unless the authorization is terminated or revoked sooner. Performed at [facility], [address] [place], [place] [address]   MRSA PCR Screening     Status: None   Collection Time: 08/19/18  8:40 AM  Result Value Ref Range Status   MRSA by PCR NEGATIVE NEGATIVE Final    Comment:        The GeneXpert MRSA Assay (FDA approved for NASAL specimens only), is one component of a comprehensive MRSA colonization surveillance program. It is not intended to diagnose MRSA infection nor to guide  or monitor treatment for MRSA infections. Performed at [facility], [address] [place], [place] [address]      Labs: BNP (last 3 results) Recent Labs    08/19/18 0550 08/20/18 0404  BNP 590.0* 355.7*   Basic Metabolic Panel: Recent Labs  Lab 08/19/18 0550 08/20/18 0404 08/21/18 0523  NA 139 137 137  K 4.2 3.8 3.7  CL 100 99 99  CO2 24 26 26   GLUCOSE 133* 123* 136*  BUN 24* 27* 27*  CREATININE 1.16 1.08 1.07  CALCIUM 9.2 8.6* 8.5*  MG  --  2.1 2.2   Liver Function Tests: Recent Labs  Lab 08/20/18 0404 08/21/18 0523  AST 17 17  ALT 17 16  ALKPHOS 64 59  BILITOT 0.9 0.7  PROT 6.4* 6.2*  ALBUMIN 3.4* 3.2*   No results for input(s): LIPASE, AMYLASE in the last 168 hours. No results for input(s): AMMONIA in the last 168 hours. CBC: Recent Labs  Lab 08/19/18 0550 08/20/18 0404 08/21/18 0523  WBC 15.7* 9.7 10.6*  NEUTROABS 11.7* 5.7 6.2  HGB 12.1* 10.7* 10.7*  HCT 38.2* 34.1* 32.8*  MCV 94.3 94.5 93.4  PLT 267 247 244   Cardiac Enzymes: Recent Labs  Lab 08/19/18 0550 08/19/18 0843 08/19/18 1455 08/19/18 1956  TROPONINI <0.03 0.04* 0.05* 0.04*   BNP: Invalid input(s): POCBNP CBG: Recent Labs  Lab 08/20/18 1138 08/20/18 1703  08/20/18 2145 08/21/18 0311 08/21/18 0725  GLUCAP 188* 112* 163* 117* 120*   D-Dimer No results for input(s): DDIMER in the last 72 hours. Hgb A1c Recent Labs    08/19/18 0550  HGBA1C 6.4*   Lipid Profile No results for input(s): CHOL, HDL, LDLCALC, TRIG, CHOLHDL, LDLDIRECT in the last 72 hours. Thyroid function studies No results for input(s): TSH, T4TOTAL, T3FREE, THYROIDAB in the last 72 hours.  Invalid input(s): FREET3 Anemia work up No results for input(s): VITAMINB12, FOLATE, FERRITIN, TIBC, IRON, RETICCTPCT in the last 72 hours. Urinalysis    Component Value Date/Time   COLORURINE YELLOW (A) 06/01/2018 1820   APPEARANCEUR CLOUDY (A) 06/01/2018 1820   LABSPEC 1.025 06/01/2018 1820   PHURINE 5.5  06/01/2018 1820   GLUCOSEU NEGATIVE 06/01/2018 1820   HGBUR MODERATE (A) 06/01/2018 1820   BILIRUBINUR NEGATIVE 06/01/2018 1820   KETONESUR NEGATIVE 06/01/2018 1820   PROTEINUR NEGATIVE 06/01/2018 1820   UROBILINOGEN 0.2 10/22/2013 1451   NITRITE NEGATIVE 06/01/2018 1820   LEUKOCYTESUR NEGATIVE 06/01/2018 1820   Sepsis Labs Invalid input(s): PROCALCITONIN,  WBC,  LACTICIDVEN Microbiology Recent Results (from the past 240 hour(s))  SARS Coronavirus 2 (CEPHEID - Performed in [place] hospital lab), Hosp Order     Status: None   Collection Time: 08/19/18  6:00 AM  Result Value Ref Range Status   SARS Coronavirus 2 NEGATIVE NEGATIVE Final    Comment: (NOTE) If result is NEGATIVE SARS-CoV-2 target nucleic acids are NOT DETECTED. The SARS-CoV-2 RNA is generally detectable in upper and lower  respiratory specimens during the acute phase of infection. The lowest  concentration of SARS-CoV-2 viral copies this assay can detect is 250  copies / mL. A negative result does not preclude SARS-CoV-2 infection  and should not be used as the sole basis for treatment or other  patient management decisions.  A negative result may occur with  improper specimen collection / handling, submission of specimen other  than nasopharyngeal swab, presence of viral mutation(s) within the  areas targeted by this assay, and inadequate number of viral copies  (<250 copies / mL). A negative result must be combined with clinical  observations, patient history, and epidemiological information. If result is POSITIVE SARS-CoV-2 target nucleic acids are DETECTED. The SARS-CoV-2 RNA is generally detectable in upper and lower  respiratory specimens dur ing the acute phase of infection.  Positive  results are indicative of active infection with SARS-CoV-2.  Clinical  correlation with patient history and other diagnostic information is  necessary to determine patient infection status.  Positive results do  not rule  out bacterial infection or co-infection with other viruses. If result is PRESUMPTIVE POSTIVE SARS-CoV-2 nucleic acids MAY BE PRESENT.   A presumptive positive result was obtained on the submitted specimen  and confirmed on repeat testing.  While 2019 novel coronavirus  (SARS-CoV-2) nucleic acids may be present in the submitted sample  additional confirmatory testing may be necessary for epidemiological  and / or clinical management purposes  to differentiate between  SARS-CoV-2 and other Sarbecovirus currently known to infect humans.  If clinically indicated additional testing with an alternate test  methodology [phone]) is advised. The SARS-CoV-2 RNA is generally  detectable in upper and lower respiratory sp ecimens during the acute  phase of infection. The expected result is Negative. Fact Sheet for Patients:  StrictlyIdeas.no Fact Sheet for Healthcare Providers: BankingDealers.co.za This test is not yet approved or cleared by the [place] FDA and has been authorized for detection  and/or diagnosis of SARS-CoV-2 by FDA under an Emergency Use Authorization (EUA).  This EUA will remain in effect (meaning this test can be used) for the duration of the COVID-19 declaration under Section 564(b)(1) of the Act, 21 U.S.C. section 360bbb-3(b)(1), unless the authorization is terminated or revoked sooner. Performed at [facility], [address] [place], [place] [address]   MRSA PCR Screening     Status: None   Collection Time: 08/19/18  8:40 AM  Result Value Ref Range Status   MRSA by PCR NEGATIVE NEGATIVE Final    Comment:        The GeneXpert MRSA Assay (FDA approved for NASAL specimens only), is one component of a comprehensive MRSA colonization surveillance program. It is not intended to diagnose MRSA infection nor to guide or monitor treatment for MRSA infections. Performed at [facility], [address], [place], [place]  [address]    Time coordinating discharge: 33 minutes   SIGNED:  Irwin Brakeman, MD  [facility] 08/21/2018, 11:27 AM How to contact the [facility] Attending or Consulting provider [place] or covering provider during after hours [place], for this patient?  1. Check the care team in [facility] and look for a) attending/consulting [facility] provider listed and b) the [facility] team listed 2. Log into [email or web address] and use [place]'s universal password to access. If you do not have the password, please contact the hospital operator. 3. Locate the [facility] provider you are looking for under [facility] and page to a number that you can be directly reached. 4. If you still have difficulty reaching the provider, please page the [facility] (Director on Call) for the Hospitalists listed on amion for assistance.

## 2018-08-21 NOTE — Progress Notes (Addendum)
ANTICOAGULATION CONSULT NOTE - Follow up [facility] for Coumadin Indication: atrial fibrillation  No Known Allergies  Patient Measurements: Height: _0  (165.1 cm) Weight: 206 lb 12.7 oz (93.8 kg) IBW/kg (Calculated) : 61.5  Vital Signs: Temp: 97.6 F (36.4 C) (06/01 0603) Temp Source: Oral (06/01 0603) BP: 140/59 (06/01 0603) Pulse Rate: 63 (06/01 0603)  Labs: Recent Labs    08/19/18 0550 08/19/18 0843 08/19/18 1455 08/19/18 1956 08/20/18 0404 08/21/18 0523  HGB 12.1*  --   --   --  10.7* 10.7*  HCT 38.2*  --   --   --  34.1* 32.8*  PLT 267  --   --   --  247 244  LABPROT 34.3*  --   --   --   --  18.7*  INR 3.5*  --   --   --   --  1.6*  CREATININE 1.16  --   --   --  1.08 1.07  TROPONINI <0.03 0.04* 0.05* 0.04*  --   --     Estimated Creatinine Clearance: 55 mL/min (by C-G formula based on SCr of 1.07 mg/dL).   Medical History: Past Medical History:  Diagnosis Date  . Atrial flutter ([place])   . BPH (benign prostatic hyperplasia)   . Coronary artery disease    a. s/p MI tx with Cypher DES to pRCA in 4/04;  b. Echocardiogram 7/09: Normal LV function.  c. Nuclear study 3/13 no ischemia;  d. ETT 2/14 neg;  e. admx with CP => [facility] (01/30/2013):  pLAD 40-50%, oD1 70-80%, oOM1 40%, pRCA stent patent, mid RCA 30%, EF 60-65%. => med Rx.  . DDD (degenerative disc disease)   . Diabetes mellitus without complication ([place])   . Dyslipidemia   . Gait disorder 07/13/2018  . Hemorrhoids   . Hx MRSA infection    left buttocks abscess  . Hx of echocardiogram    a. Echocardiogram (01/31/2013): Mild focal basal and mild concentric hypertrophy of the septum, EF 50-55%, normal wall motion, grade 1 diastolic dysfunction, trivial AI, MAC, mild LAE, PASP 35  . Hyperlipidemia   . Hypertension   . Hypothyroidism   . Meniere's disease    Status post shunt  . Myocardial infarction ([place]) 2008  . Occlusion and stenosis of carotid artery without mention of cerebral  infarction    40-59% on carotid doppler 2014; [place] (01/2013): R 1-39%, L 60-79%  . Rotator cuff injury    chronic rotator cuff injury status post repair  . Seizure disorder ([place])   . Stroke [facility])    a. 01/2013=> post cardiac cath CVA to L post communicating artery system; R sided weakness  . Syncope     Medications:  Medications Prior to Admission  Medication Sig Dispense Refill Last Dose  . acetaminophen (TYLENOL) 325 MG tablet Take 1-2 tablets (325-650 mg total) by mouth every 4 (four) hours as needed for mild pain.   Taking  . albuterol (PROVENTIL HFA;VENTOLIN HFA) 108 (90 Base) MCG/ACT inhaler Inhale 2 puffs into the lungs every 4 (four) hours as needed for wheezing or shortness of breath. 1 Inhaler 0 Past Week at Unknown time  . apixaban (ELIQUIS) 5 MG TABS tablet Take 1 tablet (5 mg total) by mouth 2 (two) times daily. 60 tablet 5   . atorvastatin (LIPITOR) 80 MG tablet Take 1 tablet (80 mg total) by mouth daily at 6 PM. 90 tablet 1 Past Week at Unknown time  . carbamazepine (CARBATROL) 300  MG 12 hr capsule Take 1 capsule (300 mg total) by mouth 2 (two) times daily. 30 capsule 0 08/18/2018 at Unknown time  . cloNIDine (CATAPRES) 0.2 MG tablet Take 1 tablet (0.2 mg total) by mouth 2 (two) times daily. 60 tablet 1 08/18/2018 at Unknown time  . escitalopram (LEXAPRO) 5 MG tablet TAKE 1 TABLET BY MOUTH EVERYDAY AT BEDTIME 90 tablet 1 08/18/2018 at Unknown time  . isosorbide mononitrate (IMDUR) 30 MG 24 hr tablet Take 1 tablet (30 mg total) by mouth daily. 90 tablet 3 08/18/2018 at Unknown time  . levETIRAcetam (KEPPRA) 250 MG tablet Take 1 tablet (250 mg total) by mouth 2 (two) times daily. 60 tablet 3 08/18/2018 at Unknown time  . levothyroxine (SYNTHROID, LEVOTHROID) 75 MCG tablet TAKE 1 TABLET BY MOUTH EVERY DAY (Patient taking differently: Take 75 mcg by mouth daily. ) 90 tablet 3 08/18/2018 at Unknown time  . losartan (COZAAR) 100 MG tablet Take 0.5 tablets (50 mg total) by mouth 2 (two) times  daily. 60 tablet 1 08/18/2018 at Unknown time  . metFORMIN (GLUCOPHAGE) 500 MG tablet TAKE 1/2 TABLET (250 MG) BY MOUTH 2 TIMES DAILY WITH A MEAL. 30 tablet 5 08/18/2018 at Unknown time  . metoprolol tartrate (LOPRESSOR) 100 MG tablet Take 1 tablet (100 mg total) by mouth 2 (two) times daily. 60 tablet 1 08/18/2018 at 900  . Multiple Vitamins-Minerals (MULTIVITAMINS THER. W/MINERALS) TABS Take 1 tablet by mouth at bedtime.     08/18/2018 at Unknown time  . warfarin (COUMADIN) 7.5 MG tablet TAKE 1&1/2 TABS TUESDAY, THURSDAY & SATURDAY*1 TAB ON SUNDAY, MONDAY, WEDNESDAY & FRIDAY WITH SUPPER 240 tablet 1 08/17/2018 at 2100    Assessment: 85 yo male admitted for             . Recently, patient switched from Coumadin 10.72m daily to eliquis 520mBID (5/28 started). With the drug-drug interaction of Carbamazepine and eliquis, it is not recommended to use eliquis. Carbamazepine is a strong inducer of CYP3A4 Pdue to the high inducibility of metabolism caused by carbamazepine ca lead to decreased exposure to apixaban and may decrease efficacy. Reviewed with MD and will transition back to Coumadin. Last dose of eliquis given 5/30 2200. Will restart Coumadin this afternoon. (INR may have inconsistent results d/t eliquis and be falsely elevated). INR is subtherapeutic this AM at 1.6.  Home dose was 7.85m73m1 and 1/2 tablets) for  Total daily dose of 11.285m26m MD notes of Epic.  Goal of Therapy:  INR 2-3 Monitor platelets by anticoagulation protocol: Yes   Plan:  Coumadin 11.25 mg po x 1 PT-INR daily Monitor for S/S of bleeding  LoriIsac Sarna PharVena AustriaPS Clinical Pharmacist Pager #[phone]/2020,8:46 AM

## 2018-08-21 NOTE — Discharge Instructions (Signed)
PLEASE GO TO PRIMARY CARE DOCTOR TO HAVE PT/INR CHECKED IN 3 DAYS (FOR WARFARIN) PLEASE WEIGH YOURSELF EVERY DAY AND REPORT ANY WEIGHT GAIN OVER 4-5 POUNDS TO PRIMARY CARE PROVIDER.   IMPORTANT INFORMATION: PAY CLOSE ATTENTION   PHYSICIAN DISCHARGE INSTRUCTIONS  Follow with Primary care provider  [name], MD  and other consultants as instructed your Hospitalist Physician  SEEK MEDICAL CARE OR RETURN TO EMERGENCY ROOM IF SYMPTOMS COME BACK, WORSEN OR NEW PROBLEM DEVELOPS.   Please note: You were cared for by a hospitalist during your hospital stay. Every effort will be made to forward records to your primary care provider.  You can request that your primary care provider send for your hospital records if they have not received them.  Once you are discharged, your primary care physician will handle any further medical issues. Please note that NO REFILLS for any discharge medications will be authorized once you are discharged, as it is imperative that you return to your primary care physician (or establish a relationship with a primary care physician if you do not have one) for your post hospital discharge needs so that they can reassess your need for medications and monitor your lab values.  Please get a complete blood count and chemistry panel checked by your Primary MD at your next visit, and again as instructed by your Primary MD.  Get Medicines reviewed and adjusted: Please take all your medications with you for your next visit with your Primary MD  Laboratory/radiological data: Please request your Primary MD to go over all hospital tests and procedure/radiological results at the follow up, please ask your primary care provider to get all Hospital records sent to his/her office.  In some cases, they will be blood work, cultures and biopsy results pending at the time of your discharge. Please request that your primary care provider follow up on these results.  If you are diabetic,  please bring your blood sugar readings with you to your follow up appointment with primary care.    Please call and make your follow up appointments as soon as possible.    Also Note the following: If you experience worsening of your admission symptoms, develop shortness of breath, life threatening emergency, suicidal or homicidal thoughts you must seek medical attention immediately by calling 911 or calling your MD immediately  if symptoms less severe.  You must read complete instructions/literature along with all the possible adverse reactions/side effects for all the Medicines you take and that have been prescribed to you. Take any new Medicines after you have completely understood and accpet all the possible adverse reactions/side effects.   Do not drive when taking Pain medications or sleeping medications (Benzodiazepines)  Do not take more than prescribed Pain, Sleep and Anxiety Medications. It is not advisable to combine anxiety,sleep and pain medications without talking with your primary care practitioner  Special Instructions: If you have smoked or chewed Tobacco  in the last 2 yrs please stop smoking, stop any regular Alcohol  and or any Recreational drug use.  Wear Seat belts while driving.    Pulmonary Edema Pulmonary edema is a condition in which fluid collects in the air sacs of the lung. This makes it hard for the lungs to fill with air. It also prevents the lungs from moving oxygen into the bloodstream, which can affect other organs, such as the brain and kidneys. Pulmonary edema is an emergency and should be treated immediately. There are two main types of pulmonary edema:  Cardiogenic. This means the pulmonary edema was caused by a problem with the heart.  Noncardiogenic. This means the pulmonary edema was caused by something other than the heart, such as an injury to the lung. What are the causes? This condition is commonly caused by heart failure. When this happens, the  heart is not able to properly pump blood through the body. This can lead to increased pressure in the heart and blood building up in the veins around the lungs. When blood builds up in these veins, fluid gets pushed into the air sacs of the lung. Heart failure may be caused by:  Coronary artery disease.  High blood pressure.  Viral infection of the heart (myocarditis).  Leaky or stiff heart valves.  Irregular heartbeat (arrhythmia).  Fluid buildup caused by kidney problems. Other causes include:  Infection in the lungs (pneumonia), blood (sepsis), or other part of the body.  Severe injury to the chest.  Lung injury from heat or toxins, such as breathing in smoke or poisonous gas.  Inhaling vomit or water (pulmonaryaspiration).  Certain medicines.  High altitude. What are the signs or symptoms? Symptoms of this condition include:  Shortness of breath.  Coughing with frothy or bloody mucus.  Wheezing.  Feeling like you cannot get enough air.  Shallow and fast breathing.  Skin that is cool and damp, and has a pale or bluish color. How is this diagnosed? This condition is diagnosed based on:  Your medical history.  A physical exam.  Your symptoms. You may also have other tests, including:  Chest X-ray.  Chest CT scan.  Blood tests, including checking the amount of oxygen in the blood.  Sputum culture. This test checks for infection in the mucus that you cough up from your lungs.  Electrocardiogram. This measures the electrical signals of the heart.  Echocardiogram. This uses an ultrasound to evaluate the health of the heart. How is this treated? Initial treatment for this condition focuses on relieving your symptoms. Treatment depends on the underlying cause of the condition. This may include:  Oxygen therapy. The oxygen may be given through tubes in your nose or through a face mask. In severe cases, a breathing tube is inserted into the windpipe and hooked  up to a breathing machine (ventilator).  Medicines. These may include medicines to: ? Help the body get rid of extra water (diuretics). ? Help the heart pump blood properly. ? Prevent or destroy blood clots. If poor heart function is the cause, treatment may also include:  Procedures to open blocked arteries, repair damaged heart valves, or remove some of the damaged heart muscle.  A pacemaker to help with heart function.  A procedure that uses electric shocks to regulate heart rate (cardioversion). If an infection is the cause, treatment may include antibiotic medicines. Follow these instructions at home: Medicines  Take over-the-counter and prescription medicines only as told by your health care provider.  If you were prescribed an antibiotic, take it as told by your health care provider. Do not stop taking the antibiotic even if you start to feel better.  Have a plan with information about each medicine you take. This should include: ? Why you take the medicine. ? Possible side effects. ? Best time of day to take it. ? Foods to take with it, or foods to avoid when taking it. ? When to call your health care provider.  Make a list of each medicine, vitamin, or herbal supplement you take. Keep the list with  you at all times. Show it to your health care provider at each visit and before starting a new medicine. Update the list as you add or stop medicines. Lifestyle   Exercise regularly as told by your health care provider. It is important to do it safely. You can do this by: ? Pacing your activities to avoid shortness of breath or chest pain. ? Resting for at least 1 hour before and after meals. ? Asking about cardiac rehabilitation programs. These may include education, exercise plans, and counseling.  Eat a heart-healthy diet that is low in salt (sodium), saturated fat, and cholesterol. Your health care provider may recommend foods that are high in fiber, such as fresh fruits  and vegetables, whole grains, and beans.  Do not use any products that contain nicotine or tobacco, such as cigarettes and e-cigarettes. If you need help quitting, ask your health care provider. General instructions  Maintain a healthy weight.  Keep a record of your weight: ? Record your hospital or clinic weight. When you get home, compare it to your scale and record your weight. ? Weigh yourself first thing each morning after you urinate and before you eat breakfast. Wear the same amount of clothing each time. Record the weights. ? Share your weight record with your health care provider. Daily weights are important in detecting the body's retention of excess fluid. ? Tell your health care provider right away if you gain weight quickly. Your medicines may need to be adjusted.  Check and record your blood pressure as often as told by your health care provider. Bring the records with you to clinic visits.  Consider therapy or joining a support group. This may help with any stress, fear, or anxiety.  Keep all follow-up visits as told by your health care provider. This is important. Get help right away if:  You gain weight quickly.  You have severe chest pain, especially if the pain is crushing or pressure-like and spreads to the arms, back, neck, or jaw.  You have more swelling in your hands, feet, ankles, or abdomen.  You have nausea.  You have unusual sweating or your skin turns blue or pale.  Your shortness of breath gets worse.  You have dizziness, blurred vision, a headache, or unsteadiness.  Your blood pressure is higher than 180/120.  You cough up bloody mucus (sputum).  You cannot sleep because it is hard to breathe.  You feel a racing heart beat (palpitations).  You have anxiety or a feeling that you cannot get enough air. These symptoms may represent a serious problem that is an emergency. Do not wait to see if the symptoms will go away. Get medical help right away.  Call your local emergency services (911 in the [place] Do not drive yourself to the hospital. Summary  Pulmonary edema is a condition in which fluid collects in the air sacs of your lungs. If left untreated, it can lead to a medical emergency.  This condition is most commonly caused by heart failure. Other causes can include infections or injury to the lungs.  Take over-the-counter and prescription medicines only as told by your health care provider. This information is not intended to replace advice given to you by your health care provider. Make sure you discuss any questions you have with your health care provider. Document Released: 05/29/2002 Document Revised: 05/19/2016 Document Reviewed: 05/19/2016 Elsevier Interactive Patient Education  2019 [employer]   Pneumonia, Adult Pneumonia is an infection of the lungs. There  are different types of pneumonia. One type can develop while a person is in a hospital. A different type, called community-acquired pneumonia, develops in people who are not, or have not recently been, in the hospital or other health care facility. What are the causes?  Pneumonia may be caused by bacteria, viruses, or funguses. Community-acquired pneumonia is often caused by Streptococcus pneumonia bacteria. These bacteria are often passed from one person to another by breathing in droplets from the cough or sneeze of an infected person. What increases the risk? The condition is more likely to develop in:  People who havechronic diseases, such as chronic obstructive pulmonary disease (COPD), asthma, congestive heart failure, cystic fibrosis, diabetes, or kidney disease.  People who haveearly-stage or late-stage HIV.  People who havesickle cell disease.  People who havehad their spleen removed (splenectomy).  People who havepoor Human resources officer.  People who havemedical conditions that increase the risk of breathing in (aspirating) secretions their own mouth  and nose.  People who havea weakened immune system (immunocompromised).  People who smoke.  People whotravel to areas where pneumonia-causing germs commonly exist.  People whoare around animal habitats or animals that have pneumonia-causing germs, including birds, bats, rabbits, cats, and farm animals. What are the signs or symptoms? Symptoms of this condition include:  Adry cough.  A wet (productive) cough.  Fever.  Sweating.  Chest pain, especially when breathing deeply or coughing.  Rapid breathing or difficulty breathing.  Shortness of breath.  Shaking chills.  Fatigue.  Muscle aches. How is this diagnosed? Your health care provider will take a medical history and perform a physical exam. You may also have other tests, including:  Imaging studies of your chest, including X-rays.  Tests to check your blood oxygen level and other blood gases.  Other tests on blood, mucus (sputum), fluid around your lungs (pleural fluid), and urine. If your pneumonia is severe, other tests may be done to identify the specific cause of your illness. How is this treated? The type of treatment that you receive depends on many factors, such as the cause of your pneumonia, the medicines you take, and other medical conditions that you have. For most adults, treatment and recovery from pneumonia may occur at home. In some cases, treatment must happen in a hospital. Treatment may include:  Antibiotic medicines, if the pneumonia was caused by bacteria.  Antiviral medicines, if the pneumonia was caused by a virus.  Medicines that are given by mouth or through an IV tube.  Oxygen.  Respiratory therapy. Although rare, treating severe pneumonia may include:  Mechanical ventilation. This is done if you are not breathing well on your own and you cannot maintain a safe blood oxygen level.  Thoracentesis. This procedureremoves fluid around one lung or both lungs to help you breathe  better. Follow these instructions at home:   Take over-the-counter and prescription medicines only as told by your health care provider. ? Only takecough medicine if you are losing sleep. Understand that cough medicine can prevent your bodys natural ability to remove mucus from your lungs. ? If you were prescribed an antibiotic medicine, take it as told by your health care provider. Do not stop taking the antibiotic even if you start to feel better.  Sleep in a semi-upright position at night. Try sleeping in a reclining chair, or place a few pillows under your head.  Do not use tobacco products, including cigarettes, chewing tobacco, and e-cigarettes. If you need help quitting, ask your health care provider.  Drink enough water to keep your urine clear or pale yellow. This will help to thin out mucus secretions in your lungs. How is this prevented? There are ways that you can decrease your risk of developing community-acquired pneumonia. Consider getting a pneumococcal vaccine if:  You are older than 84 years of age.  You are older than 83 years of age and are undergoing cancer treatment, have chronic lung disease, or have other medical conditions that affect your immune system. Ask your health care provider if this applies to you. There are different types and schedules of pneumococcal vaccines. Ask your health care provider which vaccination option is best for you. You may also prevent community-acquired pneumonia if you take these actions:  Get an influenza vaccine every year. Ask your health care provider which type of influenza vaccine is best for you.  Go to the dentist on a regular basis.  Wash your hands often. Use hand sanitizer if soap and water are not available. Contact a health care provider if:  You have a fever.  You are losing sleep because you cannot control your cough with cough medicine. Get help right away if:  You have worsening shortness of breath.  You  have increased chest pain.  Your sickness becomes worse, especially if you are an older adult or have a weakened immune system.  You cough up blood. This information is not intended to replace advice given to you by your health care provider. Make sure you discuss any questions you have with your health care provider. Document Released: 03/08/2005 Document Revised: 11/25/2016 Document Reviewed: 07/03/2014 Elsevier Interactive Patient Education  2019 [employer].   Bleeding Precautions When on Anticoagulant Therapy, Adult Anticoagulant therapy, also called blood thinner therapy, is medicine that helps to prevent and treat blood clots. The medicine works by stopping blood clots from forming or growing. Blood clots that form in your blood vessels can be dangerous. They can break loose and travel to the heart, lungs, or brain. This increases the risk of a heart attack, stroke, or blocked lung artery (pulmonary embolism). Anticoagulants also increase the risk of bleeding. Try to protect yourself from cuts and other injuries that can cause bleeding. It is important to take anticoagulants exactly as told by your health care provider. Why do I need to be on anticoagulant therapy? You may need this medicine if you are at risk of developing a blood clot. Conditions that increase your risk of a blood clot include:  Being born with heart disease or a heart malformation (congenital heart disease).  Developing heart disease.  Having had surgery, such as valve replacement.  Having had a serious accident or other type of severe injury (trauma).  Having certain types of cancer.  Having certain diseases that can increase blood clotting.  Having a high risk of stroke or heart attack.  Having atrial fibrillation (AF). What are the common anticoagulant medicines? There are several types of anticoagulant medicines. The most common types are:  Medicines that you take by mouth (oral medicines), such  as: ? Warfarin. ? Novel oral anticoagulants (NOACs), such as: ? Direct thrombin inhibitors (dabigatran). ? Factor Xa inhibitors (apixaban, edoxaban, and rivaroxaban).  Injections, such as: ? Unfractionated heparin. ? Low molecular weight heparin. These anticoagulants work in different ways to prevent blood clots. They also have different risks and side effects. What do I need to remember while on anticoagulant therapy? Taking anticoagulants  Take your medicine at the same time every day. If you forget  to take your medicine, take it as soon as you remember. Do not double your dosage of medicine if you miss a whole day. Take your normal dose and call your health care provider.  Do not stop taking your medicine unless your health care provider approves. Stopping the medicine can increase your risk of developing a blood clot. Taking other medicines  Take over-the-counter and prescriptions medicines only as told by your health care provider.  Do not take over-the-counter NSAIDs, including aspirin and ibuprofen, while you are on anticoagulant therapy. These medicines increase your risk of dangerous bleeding.  Get approval from your health care provider before you start taking any new medicines, vitamins, or herbal products. Some of these could interfere with your therapy. General instructions  Keep all follow-up visits as told by your health care provider. This is important.  If you are pregnant or trying to get pregnant, talk with a health care provider about anticoagulants. Some of these medicines are not safe to take during pregnancy.  Tell all health care providers, including your dentist, that you are on anticoagulant therapy. It is especially important to tell providers before you have any surgery, medical procedures, or dental work done. What precautions should I take?   Be very careful when using knives, scissors, or other sharp objects.  Use an electric razor instead of a  blade.  Do not use toothpicks.  Use a soft-bristled toothbrush. Brush your teeth gently.  Always wear shoes outdoors and wear slippers indoors.  Be careful when cutting your fingernails and toenails.  Place bath mats in the bathroom. If possible, install handrails as well.  Wear gloves while you do yard work.  Wear your seat belt.  Prevent falls by removing loose rugs and extension cords from areas where you walk. Use a cane or walker if you need it.  Avoid constipation by: ? Drinking enough fluid to keep your urine clear or pale yellow. ? Eating foods that are high in fiber, such as fresh fruits and vegetables, whole grains, and beans. ? Limiting foods that are high in fat and processed sugars, such as fried and sweet foods.  Do not play contact sports or participate in other activities that have a high risk for injury. What other precautions are important if on warfarin therapy? If you are taking a type of anticoagulant called warfarin, make sure you:  Work with a diet and nutrition specialist (dietitian) to make an eating plan. Do not make any sudden changes to your diet after you have started your eating plan.  Do not drink alcohol. It can interfere with your medicine and increase your risk of an injury that causes bleeding.  Get regular blood tests as told by your health care provider. What are some questions to ask my health care provider?  Why do I need anticoagulant therapy?  What is the best anticoagulant therapy for my condition?  How long will I need anticoagulant therapy?  What are the side effects of anticoagulant therapy?  When should I take my medicine? What should I do if I forget to take it?  Will I need to have regular blood tests?  Do I need to change my diet? Are there foods or drinks that I should avoid?  What activities are safe for me?  What should I do if I want to get pregnant? Contact a health care provider if:  You miss a dose of  medicine: ? And you are not sure what to do. ? For  more than one day.  You have: ? Menstrual bleeding that is heavier than normal. ? Bloody or brown urine. ? Easy bruising. ? Black and tarry stool or bright red stool. ? Side effects from your medicine.  You feel weak or dizzy.  You become pregnant. Get help right away if:  You have bleeding that will not stop within 20 minutes from: ? The nose. ? The gums. ? A cut on the skin.  You have a severe headache or stomachache.  You vomit or cough up blood.  You fall or hit your head. Summary  Anticoagulant therapy, also called blood thinner therapy, is medicine that helps to prevent and treat blood clots.  Anticoagulants work in different ways to prevent blood clots. They also have different risks and side effects.  Talk with your health care provider about any precautions that you should take while on anticoagulant therapy. This information is not intended to replace advice given to you by your health care provider. Make sure you discuss any questions you have with your health care provider. Document Released: 02/17/2015 Document Revised: 05/25/2016 Document Reviewed: 05/25/2016 Elsevier Interactive Patient Education  2019 [employer].

## 2018-08-21 NOTE — Progress Notes (Signed)
Physical Therapy Treatment Patient Details Name: [name] MRN: [id number] DOB: 17-Aug-1933 Today's Date: 08/21/2018    History of Present Illness [name] is a 85 y.o. male with Afib/Aflutter, hypertension, CAD, DM, diastolic CHF, history of MI and CVA recently discharged from [place] after a hospitalization at [facility] for recurrent syncopal episodes who presented to ED after falling out of bed and having shortness of breath and chest pain.  He reportedly did not have a hard fall. He slid out of the bed onto the floor.   He did not hit his head.  He says that he started having shortness of breath after waking up this morning.  He says that he has been feeling well until this time since being discharged from [facility].  He is being worked up by his neurologist for recurrent syncopal episodes.  He has been wearing a heart monitor.  He has an appointment in [place] for an evaluation for a suspected NPH.  He does report that he has had a mild cough and denies having fever.  He has having right chest wall pain.     PT Comments    Pt sitting on EOB and willing to participate with therapy today.  Pt with min guard/supervision through session with most difficulty completeing sit to stand from bed due to air mattress and lower surface.  Pt educated on handplacement to push up for assistance with RW for safety.  Pt able to ambulate 100 ft with no LOB, noted slow cadence due to fatigue.  EOS pt left in chair with call bell within reach, RN aware of status.     Follow Up Recommendations  Home health PT;Supervision - Intermittent;Supervision for mobility/OOB     Equipment Recommendations       Recommendations for Other Services       Precautions / Restrictions Precautions Precautions: Fall Restrictions Weight Bearing Restrictions: No    Mobility  Bed Mobility               General bed mobility comments: sitting on EOB at entrance  Transfers Overall transfer level: Modified  independent Equipment used: Rolling walker (2 wheeled) Transfers: Sit to/from Stand Sit to Stand: Supervision         General transfer comment: slightly labored movement, diffiuclty for stand to sitting due to air mattress and lower surface, verbal cueing for hand placement to assist with standing  Ambulation/Gait Ambulation/Gait assistance: Supervision Gait Distance (Feet): 100 Feet Assistive device: Rolling walker (2 wheeled) Gait Pattern/deviations: Decreased step length - right;Decreased step length - left;Decreased stride length Gait velocity: decreased   General Gait Details: slightly labored cadence without loss of balance, on room air with SpO2 at 95-97% after ambulation   Stairs             Wheelchair Mobility    Modified Rankin (Stroke Patients Only)       Balance                                            Cognition Arousal/Alertness: Awake/alert Behavior During Therapy: WFL for tasks assessed/performed Overall Cognitive Status: Within Functional Limits for tasks assessed                                        Exercises  General Comments        Pertinent Vitals/Pain Pain Assessment: No/denies pain    Home Living                      Prior Function            PT Goals (current goals can now be found in the care plan section)      Frequency    Min 3X/week      PT Plan Current plan remains appropriate    Co-evaluation              AM-PAC PT "6 Clicks" Mobility   Outcome Measure  Help needed turning from your back to your side while in a flat bed without using bedrails?: A Little Help needed moving from lying on your back to sitting on the side of a flat bed without using bedrails?: A Little Help needed moving to and from a bed to a chair (including a wheelchair)?: A Little Help needed standing up from a chair using your arms (e.g., wheelchair or bedside chair)?: None Help  needed to walk in hospital room?: A Little Help needed climbing 3-5 steps with a railing? : A Little 6 Click Score: 19    End of Session Equipment Utilized During Treatment: Gait belt Activity Tolerance: Patient tolerated treatment well;Patient limited by fatigue Patient left: in chair;with call bell/phone within reach Nurse Communication: Mobility status PT Visit Diagnosis: Unsteadiness on feet (R26.81);Other abnormalities of gait and mobility (R26.89);Muscle weakness (generalized) (M62.81)     Time: 1027-2536 PT Time Calculation (min) (ACUTE ONLY): 20 min  Charges:  $Gait Training: 8-22 mins                     [address] [place]; [place]  Aldona Lento 08/21/2018, 12:24 PM

## 2018-08-21 NOTE — Care Management Important Message (Signed)
Important Message  Patient Details  Name: [name] MRN: [id number] Date of Birth: Apr 07, 1933   Medicare Important Message Given:  Yes    [name] 08/21/2018, 11:59 AM

## 2018-08-22 ENCOUNTER — Ambulatory Visit: Payer: Medicare Other | Admitting: Family Medicine

## 2018-08-23 ENCOUNTER — Telehealth: Payer: Self-pay | Admitting: Cardiology

## 2018-08-23 NOTE — Telephone Encounter (Addendum)
preventice strips reviewed with dr Harrell Gave, DOD. Spoke with pt, he was instructed to stop his metoprolol completely. He was made an appointment to come to the office Friday this week for EKG. He is aware to call us if his heart races aware for extended period of time. Patient voiced understanding

## 2018-08-23 NOTE — Telephone Encounter (Signed)
Spoke with [employer], they report several pauses aroung 9:20 am and the longest being 7 sec. They are faxing the strips. Spoke with pt, he reports he may have been sleeping during the event and woke when they called him about the monitor. He reports he may have been a little SOB when waking but denies dizziness or feeling faint. He currently has no complaints. Will await strips.

## 2018-08-23 NOTE — Telephone Encounter (Signed)
New Message   Tamina from [employer] calling to report critical EKG on patient.  Please give her a call back.

## 2018-08-24 ENCOUNTER — Other Ambulatory Visit: Payer: Self-pay

## 2018-08-24 NOTE — Telephone Encounter (Signed)
EKG strips from monitor received from 08/23/2018 @ 9:20am.  3.1 second pause notes with atrial fibrillation sustained per report analysis.   Patient has PA OV 08/25/2018

## 2018-08-24 NOTE — Patient Outreach (Addendum)
[facility]) Care Management  08/24/2018  Elishua Radford Beattie [birth year] [id number]   Received referral on 08-21-2018  from Neila Gear, LCSW requesting to follow up with member due to CHF as patient refuses home health.  Called member at preferred number and member answered phone. HIPPA identities verified. Introduced self and explained benefits of [employer].  Member stated "I have never heard of your services until now. I have everything that I need so why do I need your services. Provided additional information regarding [facility] benefits. Member states that he will not make decision to accept or decline [facility] services without written information first.   Will send brochure and magnet to member's home. Member verified his mailing address.  Plan: Will return call to member within 2 weeks and member verbalized agreement.  Benjamine Mola "ANN" Josiah Lobo, RN-BSN  [facility] Care Management  Community Care Management Coordinator  [phone] [email or web address]

## 2018-08-25 ENCOUNTER — Other Ambulatory Visit: Payer: Self-pay

## 2018-08-25 ENCOUNTER — Ambulatory Visit (INDEPENDENT_AMBULATORY_CARE_PROVIDER_SITE_OTHER): Payer: Medicare Other | Admitting: Physician Assistant

## 2018-08-25 ENCOUNTER — Ambulatory Visit (INDEPENDENT_AMBULATORY_CARE_PROVIDER_SITE_OTHER): Payer: Medicare Other | Admitting: Family Medicine

## 2018-08-25 ENCOUNTER — Encounter: Payer: Self-pay | Admitting: Family Medicine

## 2018-08-25 VITALS — BP 144/74 | HR 85 | Temp 98.1°F | Resp 18 | Ht 68.0 in | Wt 212.2 lb

## 2018-08-25 VITALS — BP 152/70 | HR 40 | Temp 97.4°F | Ht 68.0 in | Wt 211.4 lb

## 2018-08-25 DIAGNOSIS — I2510 Atherosclerotic heart disease of native coronary artery without angina pectoris: Secondary | ICD-10-CM

## 2018-08-25 DIAGNOSIS — I480 Paroxysmal atrial fibrillation: Secondary | ICD-10-CM

## 2018-08-25 DIAGNOSIS — I6523 Occlusion and stenosis of bilateral carotid arteries: Secondary | ICD-10-CM

## 2018-08-25 DIAGNOSIS — E785 Hyperlipidemia, unspecified: Secondary | ICD-10-CM

## 2018-08-25 DIAGNOSIS — I4892 Unspecified atrial flutter: Secondary | ICD-10-CM

## 2018-08-25 DIAGNOSIS — R001 Bradycardia, unspecified: Secondary | ICD-10-CM

## 2018-08-25 DIAGNOSIS — I10 Essential (primary) hypertension: Secondary | ICD-10-CM | POA: Diagnosis not present

## 2018-08-25 DIAGNOSIS — R269 Unspecified abnormalities of gait and mobility: Secondary | ICD-10-CM

## 2018-08-25 DIAGNOSIS — E118 Type 2 diabetes mellitus with unspecified complications: Secondary | ICD-10-CM

## 2018-08-25 DIAGNOSIS — Z8673 Personal history of transient ischemic attack (TIA), and cerebral infarction without residual deficits: Secondary | ICD-10-CM | POA: Diagnosis not present

## 2018-08-25 DIAGNOSIS — Z7901 Long term (current) use of anticoagulants: Secondary | ICD-10-CM | POA: Diagnosis not present

## 2018-08-25 DIAGNOSIS — E039 Hypothyroidism, unspecified: Secondary | ICD-10-CM

## 2018-08-25 DIAGNOSIS — E119 Type 2 diabetes mellitus without complications: Secondary | ICD-10-CM | POA: Diagnosis not present

## 2018-08-25 DIAGNOSIS — I5032 Chronic diastolic (congestive) heart failure: Secondary | ICD-10-CM

## 2018-08-25 LAB — PT WITH INR/FINGERSTICK
INR, fingerstick: 2.3 ratio — ABNORMAL HIGH
PT, fingerstick: 27.9 s — ABNORMAL HIGH (ref 10.5–13.1)

## 2018-08-25 MED ORDER — FUROSEMIDE 20 MG PO TABS: 10.0000 mg | ORAL_TABLET | Freq: Every day | ORAL | 3 refills | Status: DC

## 2018-08-25 MED ORDER — LOSARTAN POTASSIUM 100 MG PO TABS: 100.0000 mg | ORAL_TABLET | Freq: Every day | ORAL | 3 refills | Status: DC

## 2018-08-25 MED ORDER — GLUCOSE BLOOD VI STRP: 1.0000 | ORAL_STRIP | 3 refills | Status: DC | PRN

## 2018-08-25 MED ORDER — ACCU-CHEK SAFE-T PRO LANCETS MISC: 1.0000 | 3 refills | Status: DC | PRN

## 2018-08-25 MED ORDER — FREESTYLE SYSTEM KIT: 1.0000 | PACK | 0 refills | Status: DC | PRN

## 2018-08-25 MED ORDER — ISOSORBIDE MONONITRATE ER 60 MG PO TB24: 60.0000 mg | ORAL_TABLET | Freq: Every day | ORAL | 3 refills | Status: DC

## 2018-08-25 NOTE — Progress Notes (Addendum)
Cardiology Office Note    Date:  08/26/2018   ID:  PRESS CASALE, DOB 05/18/1933, MRN [id number]  PCP:  [name], MD  Cardiologist:  Dr. Percival Spanish  Chief Complaint  Patient presents with   Follow-up    seen for Dr. Percival Spanish. Review heart monitor report    History of Present Illness:  [name] is a 85 y.o. male with PMH of CAD, CVA, atrial flutter, hyperlipidemia, DM 2, hypertension, hypothyroidism and carotid artery disease.  Patient had a CVA following his 2014 cardiac catheterization.  This was felt to be embolic from the procedure.  Despite his significant recovery, he continued to have right-sided arm and leg weakness with right visual deficit.  He is on Coumadin for atrial flutter.  He was switched from Xarelto due to possible interaction with Tegretol.  He had a previous Holter monitor placed in August 2017 that showed sinus rhythm with sinus tachycardia but no atrial flutter.  Renal artery duplex obtained in October 2017 was normal.   More recently, he was admitted for over 2 weeks in March with syncope, mental status change and abnormal chewing moment as well as eye fluttering.  He was treated with IVF however continued to be symptomatic.  EEG was negative for seizure.  MRI revealed findings consistent with mild chronic complicating hydrocephalus.  Carotid Doppler obtained on 05/30/2018 showed 1 to 39% bilateral ICA disease.  Echocardiogram on obtained on the same day showed EF 60 to 65%, moderate mitral annular calcification.  Hospitalization was complicated by E. coli UTI and was treated with Keflex for 5 days.  Lasix was stopped during that admission.  Patient was readmitted near the end of May for recurrent syncope episode after falling out of bed and complained of shortness of breath and chest pain.  During the hospitalization, he was hypertensive and tachypneic.  Chest pain was felt to be related to acute heart failure.  Chest x-ray showed interstitial edema, Lasix  was restarted.  He was diuresed 3 L.  Apparently he was on Eliquis when he went to the hospital, due to interaction with his carbamazepine, he was switched back to Coumadin.   Based on recent telephone note, he apparently has been having pauses along with his A. fib on the heart monitor.  His metoprolol 196m BID was discontinued completely.  Patient presents today for cardiology office visit.  He denies any recent chest discomfort, shortness of breath, or dizziness.  His blood pressure is elevated at 152/70.  Initial pulse was 48, however EKG strip shows heart rate was in fact in the 60s.  It appears he has heart rate has improved after discontinuation of the metoprolol.  I will keep him on the current regiment.  Unfortunately he is weight steady increase by 5 pounds since discharge.  On physical exam, I did not find any swelling in the leg or crackles in the lungs.  I did increase his Lasix to 10 mg daily.  For his high blood pressure, I increased his Imdur to 60 mg daily and also increased his losartan to 100 mg daily.  He will need a basic metabolic panel at PCPs office in 1 week.  We will follow-up with the patient in 2 months.  Past Medical History:  Diagnosis Date   Atrial flutter ([place]    BPH (benign prostatic hyperplasia)    Coronary artery disease    a. s/p MI tx with Cypher DES to pRCA in 4/04;  b. Echocardiogram 7/09: Normal  LV function.  c. Nuclear study 3/13 no ischemia;  d. ETT 2/14 neg;  e. admx with CP => LHC (01/30/2013):  pLAD 40-50%, oD1 70-80%, oOM1 40%, pRCA stent patent, mid RCA 30%, EF 60-65%. => med Rx.   DDD (degenerative disc disease)    Diabetes mellitus without complication ([place])    Dyslipidemia    Gait disorder 07/13/2018   Hemorrhoids    Hx MRSA infection    left buttocks abscess   Hx of echocardiogram    a. Echocardiogram (01/31/2013): Mild focal basal and mild concentric hypertrophy of the septum, EF 50-55%, normal wall motion, grade 1 diastolic  dysfunction, trivial AI, MAC, mild LAE, PASP 35   Hyperlipidemia    Hypertension    Hypothyroidism    Meniere's disease    Status post shunt   Myocardial infarction [facility]) 2008   Occlusion and stenosis of carotid artery without mention of cerebral infarction    40-59% on carotid doppler 2014; [place] (01/2013): R 1-39%, L 60-79%   Rotator cuff injury    chronic rotator cuff injury status post repair   Seizure disorder ([place])    Stroke ([place])    a. 01/2013=> post cardiac cath CVA to L post communicating artery system; R sided weakness   Syncope     Past Surgical History:  Procedure Laterality Date   COLONOSCOPY     CORONARY ANGIOPLASTY WITH STENT PLACEMENT  07/14/2002   Stent to the right coronary artery   ENDOLYMPHATIC SHUNT DECOMPRESSION  06/11/2009    Right endolymphatic sac decompression and shunt  placement   HERNIA REPAIR  04/10/2008   scrotal hernia repair   LEFT HEART CATHETERIZATION WITH CORONARY ANGIOGRAM N/A 01/30/2013   Procedure: LEFT HEART CATHETERIZATION WITH CORONARY ANGIOGRAM;  Surgeon: Larey Dresser, MD;  Location: [facility] CATH LAB;  Service: Cardiovascular;  Laterality: N/A;   ROTATOR CUFF REPAIR     for chronic rotator cuff injury    Current Medications: Outpatient Medications Prior to Visit  Medication Sig Dispense Refill   acetaminophen (TYLENOL) 325 MG tablet Take 1-2 tablets (325-650 mg total) by mouth every 4 (four) hours as needed for mild pain.     albuterol (PROVENTIL HFA;VENTOLIN HFA) 108 (90 Base) MCG/ACT inhaler Inhale 2 puffs into the lungs every 4 (four) hours as needed for wheezing or shortness of breath. 1 Inhaler 0   atorvastatin (LIPITOR) 80 MG tablet Take 1 tablet (80 mg total) by mouth daily at 6 PM. 90 tablet 1   carbamazepine (CARBATROL) 300 MG 12 hr capsule Take 1 capsule (300 mg total) by mouth 2 (two) times daily. 30 capsule 0   cloNIDine (CATAPRES) 0.2 MG tablet Take 1 tablet (0.2 mg total) by mouth 2 (two) times daily. 60  tablet 1   escitalopram (LEXAPRO) 5 MG tablet TAKE 1 TABLET BY MOUTH EVERYDAY AT BEDTIME 90 tablet 1   levETIRAcetam (KEPPRA) 250 MG tablet Take 1 tablet (250 mg total) by mouth 2 (two) times daily. 60 tablet 3   levothyroxine (SYNTHROID, LEVOTHROID) 75 MCG tablet TAKE 1 TABLET BY MOUTH EVERY DAY (Patient taking differently: Take 75 mcg by mouth daily. ) 90 tablet 3   metFORMIN (GLUCOPHAGE) 500 MG tablet TAKE 1/2 TABLET (250 MG) BY MOUTH 2 TIMES DAILY WITH A MEAL. 30 tablet 5   Multiple Vitamins-Minerals (MULTIVITAMINS THER. W/MINERALS) TABS Take 1 tablet by mouth at bedtime.       warfarin (COUMADIN) 7.5 MG tablet TAKE 1&1/2 TABS TUESDAY, THURSDAY & SATURDAY*1 TAB ON SUNDAY, MONDAY,  WEDNESDAY & FRIDAY WITH SUPPER 240 tablet 1   furosemide (LASIX) 20 MG tablet Take 0.5 tablets (10 mg total) by mouth every other day. 10 tablet 0   isosorbide mononitrate (IMDUR) 30 MG 24 hr tablet Take 1 tablet (30 mg total) by mouth daily. 90 tablet 3   losartan (COZAAR) 100 MG tablet Take 0.5 tablets (50 mg total) by mouth 2 (two) times daily. 60 tablet 1   No facility-administered medications prior to visit.      Allergies:   Penicillins   Social History   Socioeconomic History   Marital status: Married    Spouse name: Not on file   Number of children: 4   Years of education: 12th   Highest education level: Not on file  Occupational History   Occupation: Retired  Scientist, product/process development strain: Not on file   Food insecurity:    Worry: Not on file    Inability: Not on file   Transportation needs:    Medical: Not on file    Non-medical: Not on file  Tobacco Use   Smoking status: Former Smoker    Years: 5.00    Types: Cigarettes    Last attempt to quit: 08/19/1956    Years since quitting: 62.0   Smokeless tobacco: Former Systems developer    Types: Chew  Substance and Sexual Activity   Alcohol use: No   Drug use: No   Sexual activity: Never  Lifestyle   Physical  activity:    Days per week: Not on file    Minutes per session: Not on file   Stress: Not on file  Relationships   Social connections:    Talks on phone: Not on file    Gets together: Not on file    Attends religious service: Not on file    Active member of club or organization: Not on file    Attends meetings of clubs or organizations: Not on file    Relationship status: Not on file  Other Topics Concern   Not on file  Social History Narrative   Lives with wife.     Family History:  The patient's family history includes Aneurysm in his mother; Coronary artery disease in his brother; Diabetes in his brother; Heart attack (age of onset: 35) in his father.   ROS:   Please see the history of present illness.    ROS All other systems reviewed and are negative.   PHYSICAL EXAM:   VS:  BP (!) 152/70    Pulse (!) 40    Temp (!) 97.4 F (36.3 C)    Ht _0  (1.727 m)    Wt 211 lb 6.4 oz (95.9 kg)    SpO2 92%    BMI 32.14 kg/m    GEN: Well nourished, well developed, in no acute distress  HEENT: normal  Neck: no JVD, carotid bruits, or masses Cardiac: RRR; no murmurs, rubs, or gallops,no edema  Respiratory:  clear to auscultation bilaterally, normal work of breathing GI: soft, nontender, nondistended, + BS MS: no deformity or atrophy  Skin: warm and dry, no rash Neuro:  Alert and Oriented x 3, Strength and sensation are intact Psych: euthymic mood, full affect  Wt Readings from Last 3 Encounters:  08/25/18 211 lb 6.4 oz (95.9 kg)  08/25/18 212 lb 3.2 oz (96.3 kg)  08/21/18 206 lb 12.7 oz (93.8 kg)      Studies/Labs Reviewed:   EKG:  EKG is ordered today.  The ekg ordered today demonstrates 3:1 atrial flutter, HR 62, no significant pause   Recent Labs: 06/01/2018: TSH 3.023 08/20/2018: B Natriuretic Peptide 315.0 08/21/2018: ALT 16; BUN 27; Creatinine, Ser 1.07; Hemoglobin 10.7; Magnesium 2.2; Platelets 244; Potassium 3.7; Sodium 137   Lipid Panel    Component Value  Date/Time   CHOL 179 05/30/2018 0440   TRIG 131 05/30/2018 0440   HDL 50 05/30/2018 0440   CHOLHDL 3.6 05/30/2018 0440   VLDL 26 05/30/2018 0440   LDLCALC 103 (H) 05/30/2018 0440    Additional studies/ records that were reviewed today include:   Heart monitor report: Atrial flutter with 3.5-second pause  Echo 05/30/2018 IMPRESSIONS    1. Left atrial size was mildly dilated.  2. The mitral valve was not well visualized. There is moderate mitral annular calcification present.  3. The aortic valve was not well visualized Mild calcification of the aortic valve.  4. Extremely limited; definity used; normal LV systolic function; mild diastolic dysfunction; if clinically indicated, TEE would have better sensitivity for source of embolus.   ASSESSMENT:    1. Atrial flutter, unspecified type ([place])   2. Coronary artery disease involving native coronary artery of native heart without angina pectoris   3. H/O: CVA (cerebrovascular accident)   4. Hyperlipidemia LDL goal <70   5. Controlled type 2 diabetes mellitus without complication, without long-term current use of insulin ([place])   6. Essential hypertension   7. Hypothyroidism, unspecified type   8. Bilateral carotid artery stenosis   9. Bradycardia   10. Chronic diastolic heart failure (HCC)      PLAN:  In order of problems listed above:  1. Atrial flutter with bradycardia: Occasionally has pauses up to 3.5 seconds.  There was a reported pause up to 7 seconds, however I was unable to locate this strip.  His 100 mg twice daily of metoprolol has been discontinued.  It has been about 48 hours since his last dose, his heart rate seems to be well controlled by itself, EKG strip today demonstrated fixed 3-1 atrial flutter with very regular heart rate.  I have also called his daughter at his request to give her update about medication changes as well since it is his family member who will manage his medication.  According to his daughter, 4  family members rotate to go to his house and take care of him.  2. Hypertension: After 100 mg twice daily of metoprolol was discontinued, his blood pressure is understandably high.  I will increase his losartan to 100 mg daily and Imdur to 60 mg daily  3. Acute on chronic diastolic heart failure:  he recently underwent IV diuresis with loss of 3 L.  Since his discharge, he has gained those weight back.  Therefore I plan to increase the Lasix to 10 mg daily.  He will need a basic metabolic panel in 1 week which he prefers to do at his PCPs office since it is closer to where he lives.  4. CAD: Denies any recent chest pain  5. History of CVA: No recurrence  6. Hypothyroidism: On Synthroid  7. Hyperlipidemia: On Lipitor 80 mg daily  8. Bilateral carotid artery disease: Recent carotid Doppler obtained on 05/30/2018 showed 1 to 39% disease bilaterally.    Medication Adjustments/Labs and Tests Ordered: Current medicines are reviewed at length with the patient today.  Concerns regarding medicines are outlined above.  Medication changes, Labs and Tests ordered today are listed in the Patient Instructions below. Patient  Instructions  Medication Instructions:   STOP METOPROLOL  INCREASE LOSARTAN TO 100 MG DAILY  INCREASE IMDUR TO 60 MG DAILY  INCREASE LASIX TO 10 MG DAILY  If you need a refill on your cardiac medications before your next appointment, please call your pharmacy.   Lab work:  YOU WILL NEED TO HAVE LABS (BLOOD WORK) DRAWN AT YOUR PRIMARY CARE DOCTOR IN 1 week: BMET   If you have labs (blood work) drawn today and your tests are completely normal, you will receive your results only by:  [place] (if you have MyChart) OR  A paper copy in the mail If you have any lab test that is abnormal or we need to change your treatment, we will call you to review the results.  Testing/Procedures:  NONE ordered at this time of appointment   Follow-Up: At [facility], you and  your health needs are our priority.  As part of our continuing mission to provide you with exceptional heart care, we have created designated Provider Care Teams.  These Care Teams include your primary Cardiologist (physician) and Advanced Practice Providers (APPs -  Physician Assistants and Nurse Practitioners) who all work together to provide you with the care you need, when you need it. You will need a follow up appointment in 2 months.  Please call our office 2 months in advance to schedule this appointment.  You may see Minus Breeding, MD or one of the following Advanced Practice Providers on your designated Care Team:   Rosaria Ferries, PA-C  Jory Sims, DNP, ANP  Any Other Special Instructions Will Be Listed Below (If Applicable).   MONITOR YOUR BLOOD PRESSURE AND WEIGHT DAILY    IF YOUR WEIGHT IS INCREASED BY 3 LBS OVERNIGHT AND 5 LBS IN A WEEK CALL THE OFFICE        Hilbert Corrigan, [place]  08/26/2018 12:24 PM    [facility] [place], [place], [place]  [address] Phone: [phone]; Fax: [phone]

## 2018-08-25 NOTE — Patient Instructions (Addendum)
Glucometer sent pharmacy  Continue current dose of coumadin  F/U Dr. Tessa Lerner F/U 1 week for coumadin check - [name]

## 2018-08-25 NOTE — Assessment & Plan Note (Signed)
Continue coumadin, goal INR  2-3  At goal today  2.3 Recheck in 1 week Discussed with family, if eating leafy greans needs to keep the same each week so his coumadin will stay stable Discussed low sodium foods as well

## 2018-08-25 NOTE — Assessment & Plan Note (Signed)
History of Stroke, probable NPH Client having any home health services.  States that he did not see any difference when he did it for 3 weeks straight.  I did recommend that he follow-up with Dr. Tessa Lerner 1 more time to see if he wants to do any other type of rehab with him.  Follow-up with his neurologist Dr. Jannifer Franklin locally in July they are unsure when he will see the specialist for the NPH in [place]

## 2018-08-25 NOTE — Telephone Encounter (Signed)
Beta blocker was stopped and patient seen in clinic

## 2018-08-25 NOTE — Progress Notes (Signed)
Subjective:    Patient ID: [name], male    DOB: [birth year], 85 y.o.   MRN: [id number]  Patient presents for Coagulation Disorder Here for hospital follow-up.  He was admitted to the hospital secondary to acute on chronic heart failure he was diuresed by his cardiologist.  He also has underlying fibrillation he was on Coumadin therapy they tried to put him on Eliquis however this interacted with his carbamazepine so this was discontinued and he was restarted on Coumadin.  At discharge his INR was 1.6.  Not have his medications with him in the office however we did verify after the visit he is taking Coumadin 1-1/2 tablets Tuesday Thursday Saturday and 1 tablet all other days.  He denies any abnormal bleeding. Dates that his weight at home is 205 pounds    DM- Last A1C 6.4% he is currently taking metformin 500 mg once a day.  He does not check his blood sugars he does not have a meter at home.  He denies any hypoglycemia symptoms ever he has had these episodes where he is passed out this is being worked up by cardiology he currently has a monitor on 1 and he will also be seen a neurologist in [place] in the next few months to rule out other syncopal disorders.  Declined physical therapy and home health services when he left the hospital.  He does have follow-up with Dr. Tessa [name] in about 2 weeks which I recommended that he keep since he declined the home health services.  He was also noted that he had hospital-acquired pneumonia he was given vancomycin and sent home with 3 more days of doxycycline which we did verify that he did complete.  Denies any significant cough or shortness of breath no fever   [name] Grand-daughter today.       Review Of Systems:  GEN- denies fatigue, fever, weight loss,weakness, recent illness HEENT- denies eye drainage, change in vision, nasal discharge, CVS- denies chest pain, palpitations RESP- denies SOB, cough, wheeze ABD- denies N/V, change in  stools, abd pain GU- denies dysuria, hematuria, dribbling, incontinence MSK- + joint pain, muscle aches, injury Neuro- denies headache, dizziness, syncope, seizure activity       Objective:    BP (!) 144/74   Pulse 85   Temp 98.1 F (36.7 C)   Resp 18   Ht 5\' 8"  (1.727 m)   Wt 212 lb 3.2 oz (96.3 kg)   SpO2 96%   BMI 32.26 kg/m  GEN- NAD, alert and oriented x3 sitting in chair  HEENT- PERRL, EOMI, non injected sclera, pink conjunctiva, MMM, oropharynx clear Neck- Supple, no thyromegaly, no JVD CVS- RRR, no murmur RESP-CTAB ABD-NABS,soft,NT,ND EXT- trace bilat  edema Pulses- Radial 2+        Assessment & Plan:      Problem List Items Addressed This Visit      Unprioritized   Chronic anticoagulation - Primary   Controlled type 2 diabetes mellitus with complication, without long-term current use of insulin (HCC)    Well controlled on low dose MTF, given script for meter Daughter can check glucose 2-3 times a week when she goes over       Gait disorder    History of Stroke, probable NPH Client having any home health services.  States that he did not see any difference when he did it for 3 weeks straight.  I did recommend that he follow-up with Dr. Tessa [name] 1 more time to see if  he wants to do any other type of rehab with him.  Follow-up with his neurologist Dr. Jannifer [name] locally in July they are unsure when he will see the specialist for the NPH in [place]      HTN (hypertension)    Mild elevation monitor for now, percardiology, improved from admission      Paroxysmal atrial fibrillation ([place])    Continue coumadin, goal INR  2-3  At goal today  2.3 Recheck in 1 week Discussed with family, if eating leafy greans needs to keep the same each week so his coumadin will stay stable Discussed low sodium foods as well          Note: This dictation was prepared with Dragon dictation along with smaller phrase technology. Any transcriptional errors that result from this  process are unintentional.

## 2018-08-25 NOTE — Assessment & Plan Note (Signed)
Well controlled on low dose MTF, given script for meter Daughter can check glucose 2-3 times a week when she goes over

## 2018-08-25 NOTE — Assessment & Plan Note (Addendum)
Mild elevation monitor for now, percardiology, improved from admission

## 2018-08-25 NOTE — Patient Instructions (Addendum)
Medication Instructions:   STOP METOPROLOL  INCREASE LOSARTAN TO 100 MG DAILY  INCREASE IMDUR TO 60 MG DAILY  INCREASE LASIX TO 10 MG DAILY  If you need a refill on your cardiac medications before your next appointment, please call your pharmacy.   Lab work:  YOU WILL NEED TO HAVE LABS (BLOOD WORK) DRAWN AT YOUR PRIMARY CARE DOCTOR IN 1 week: BMET   If you have labs (blood work) drawn today and your tests are completely normal, you will receive your results only by: Marland Kitchen MyChart Message (if you have MyChart) OR . A paper copy in the mail If you have any lab test that is abnormal or we need to change your treatment, we will call you to review the results.  Testing/Procedures:  NONE ordered at this time of appointment   Follow-Up: At [facility], you and your health needs are our priority.  As part of our continuing mission to provide you with exceptional heart care, we have created designated Provider Care Teams.  These Care Teams include your primary Cardiologist (physician) and Advanced Practice Providers (APPs -  Physician Assistants and Nurse Practitioners) who all work together to provide you with the care you need, when you need it. You will need a follow up appointment in 2 months.  Please call our office 2 months in advance to schedule this appointment.  You may see Minus Breeding, MD or one of the following Advanced Practice Providers on your designated Care Team:   Rosaria Ferries, PA-C . Jory Sims, DNP, ANP  Any Other Special Instructions Will Be Listed Below (If Applicable).   MONITOR YOUR BLOOD PRESSURE AND WEIGHT DAILY    IF YOUR WEIGHT IS INCREASED BY 3 LBS OVERNIGHT AND 5 LBS IN A WEEK CALL THE OFFICE

## 2018-08-26 ENCOUNTER — Encounter: Payer: Self-pay | Admitting: Physician Assistant

## 2018-08-29 ENCOUNTER — Telehealth: Payer: Self-pay | Admitting: Cardiology

## 2018-08-29 NOTE — Telephone Encounter (Signed)
New Message           Patient's daughter Maudie Mercury) is calling today to get a letter stating that it's ok for the patient to go to [facility] for treatment for "Normal Pressure Hyerocephalus" Patient daughter is needing this letter as soon as possible!! Her father is having a lot of off balance issues and they need to do this as soon as possible.

## 2018-08-29 NOTE — Telephone Encounter (Signed)
Spoke with pt dtr, this was recommended for the patient during his last hospital stay. He is wearing a heart monitor at this time and they feel he may need a letter to be able to do the procedure. Made dtr aware once the monitor is reviewed we will be in a better place to give clearance. We will await the results of the monitor once turned in on the 11th.

## 2018-09-04 ENCOUNTER — Other Ambulatory Visit: Payer: Self-pay | Admitting: Physical Medicine and Rehabilitation

## 2018-09-04 ENCOUNTER — Other Ambulatory Visit: Payer: Self-pay

## 2018-09-06 ENCOUNTER — Encounter: Payer: Medicare Other | Admitting: Physical Medicine & Rehabilitation

## 2018-09-07 ENCOUNTER — Other Ambulatory Visit: Payer: Self-pay

## 2018-09-07 NOTE — Patient Outreach (Signed)
[facility]) Care Management  09/07/2018  Tallan Sandoz Blanks [birth year] [id number]  Called member at preferred number and HIPPA identifier verified. Introduced self again to member and asked if he received the letter that was sent to him and member stated "I don't recall if I did". Member made aware that another letter will be sent and member verified his address. Attempted to explain [employer] benefits to member again; however, member stated that either way, he does not want [facility].  Member stated "I have completed therapy. My two children are here and one of my children is a nurse who can help me". Member declines [employer] benefits and member made aware that RN CM will notify his physician and will send member a case closure letter as well. Member then stated "I don't know why my doctor think he has to sign me up for every service."  Will not open case at this time. Will send case closure letter to member and physician. No other [employer] disciplines involved at this time.   Benjamine Mola "ANN" Josiah Lobo, RN-BSN  [facility] Care Management  Community Care Management Coordinator  [phone] [email or web address]

## 2018-09-13 ENCOUNTER — Other Ambulatory Visit: Payer: Self-pay

## 2018-09-13 ENCOUNTER — Telehealth: Payer: Self-pay | Admitting: *Deleted

## 2018-09-13 ENCOUNTER — Telehealth: Payer: Self-pay | Admitting: Neurology

## 2018-09-13 DIAGNOSIS — R55 Syncope and collapse: Secondary | ICD-10-CM

## 2018-09-13 DIAGNOSIS — I455 Other specified heart block: Secondary | ICD-10-CM

## 2018-09-13 NOTE — Telephone Encounter (Signed)
Attempting to contact patient to inform 3 day ZIO XT long term holter monitor being mailed to home.  Once received, review instructions, apply patch, click button on patch ( green light will blink then go out), which turns the monitor on.  Do not shower for 24 hours. Remove monitor after 48 hours, attach to back cover of patient log book.  Place logbook in orange box and seal box with tape.  Drop orange box in mailbox asap.  Your physician should have results with 5-7 days after monitor mailed back to [employer]/ [employer].

## 2018-09-13 NOTE — Telephone Encounter (Signed)
I called the patient.  The cardiac monitor study that showed frequent pauses, the longest pause being 7 seconds which may be significant enough to result in syncope.  The patient has been taken off of beta-blockers, he will see his cardiologist in September.  The family is considering getting him to [facility] for a work-up for possible normal pressure hydrocephalus, but the cardiac issue once again needs to be resolved before this evaluation occurs, the patient may require several days off of Coumadin for a full neurosurgical evaluation.  Some of the protocols require a 3-day hospital stay with physical therapy evaluation and a lumbar drain to determine whether the the gait improves with shunting of the spinal fluid.   Cardiac monitor 09/13/18:  Narrative & Impression    Atrial flutter Variable conduction Slow ventricular rate with frequent pauses  With the longest pause being 7 seconds

## 2018-09-13 NOTE — Telephone Encounter (Signed)
Patient contacted with the above information.

## 2018-09-13 NOTE — Progress Notes (Signed)
Orders given per Almyra Deforest, PA-C

## 2018-09-19 ENCOUNTER — Ambulatory Visit (INDEPENDENT_AMBULATORY_CARE_PROVIDER_SITE_OTHER): Payer: Medicare Other

## 2018-09-19 DIAGNOSIS — I455 Other specified heart block: Secondary | ICD-10-CM

## 2018-09-19 DIAGNOSIS — R55 Syncope and collapse: Secondary | ICD-10-CM

## 2018-09-21 ENCOUNTER — Ambulatory Visit: Payer: Self-pay | Admitting: Neurology

## 2018-09-21 ENCOUNTER — Encounter

## 2018-09-27 DIAGNOSIS — H353132 Nonexudative age-related macular degeneration, bilateral, intermediate dry stage: Secondary | ICD-10-CM | POA: Diagnosis not present

## 2018-09-27 DIAGNOSIS — E119 Type 2 diabetes mellitus without complications: Secondary | ICD-10-CM | POA: Diagnosis not present

## 2018-09-27 DIAGNOSIS — H5213 Myopia, bilateral: Secondary | ICD-10-CM | POA: Diagnosis not present

## 2018-09-27 DIAGNOSIS — H04123 Dry eye syndrome of bilateral lacrimal glands: Secondary | ICD-10-CM | POA: Diagnosis not present

## 2018-09-27 DIAGNOSIS — H26493 Other secondary cataract, bilateral: Secondary | ICD-10-CM | POA: Diagnosis not present

## 2018-09-29 IMAGING — CR DG CHEST 1V PORT
1 series · 1 of 1 positions shown · non-contrast
Comparison: Chest radiograph from 10/16/2015

CLINICAL DATA: Acute onset of generalized chest pain and shortness
of breath. Initial encounter.

EXAM:
PORTABLE CHEST 1 VIEW

[AP]
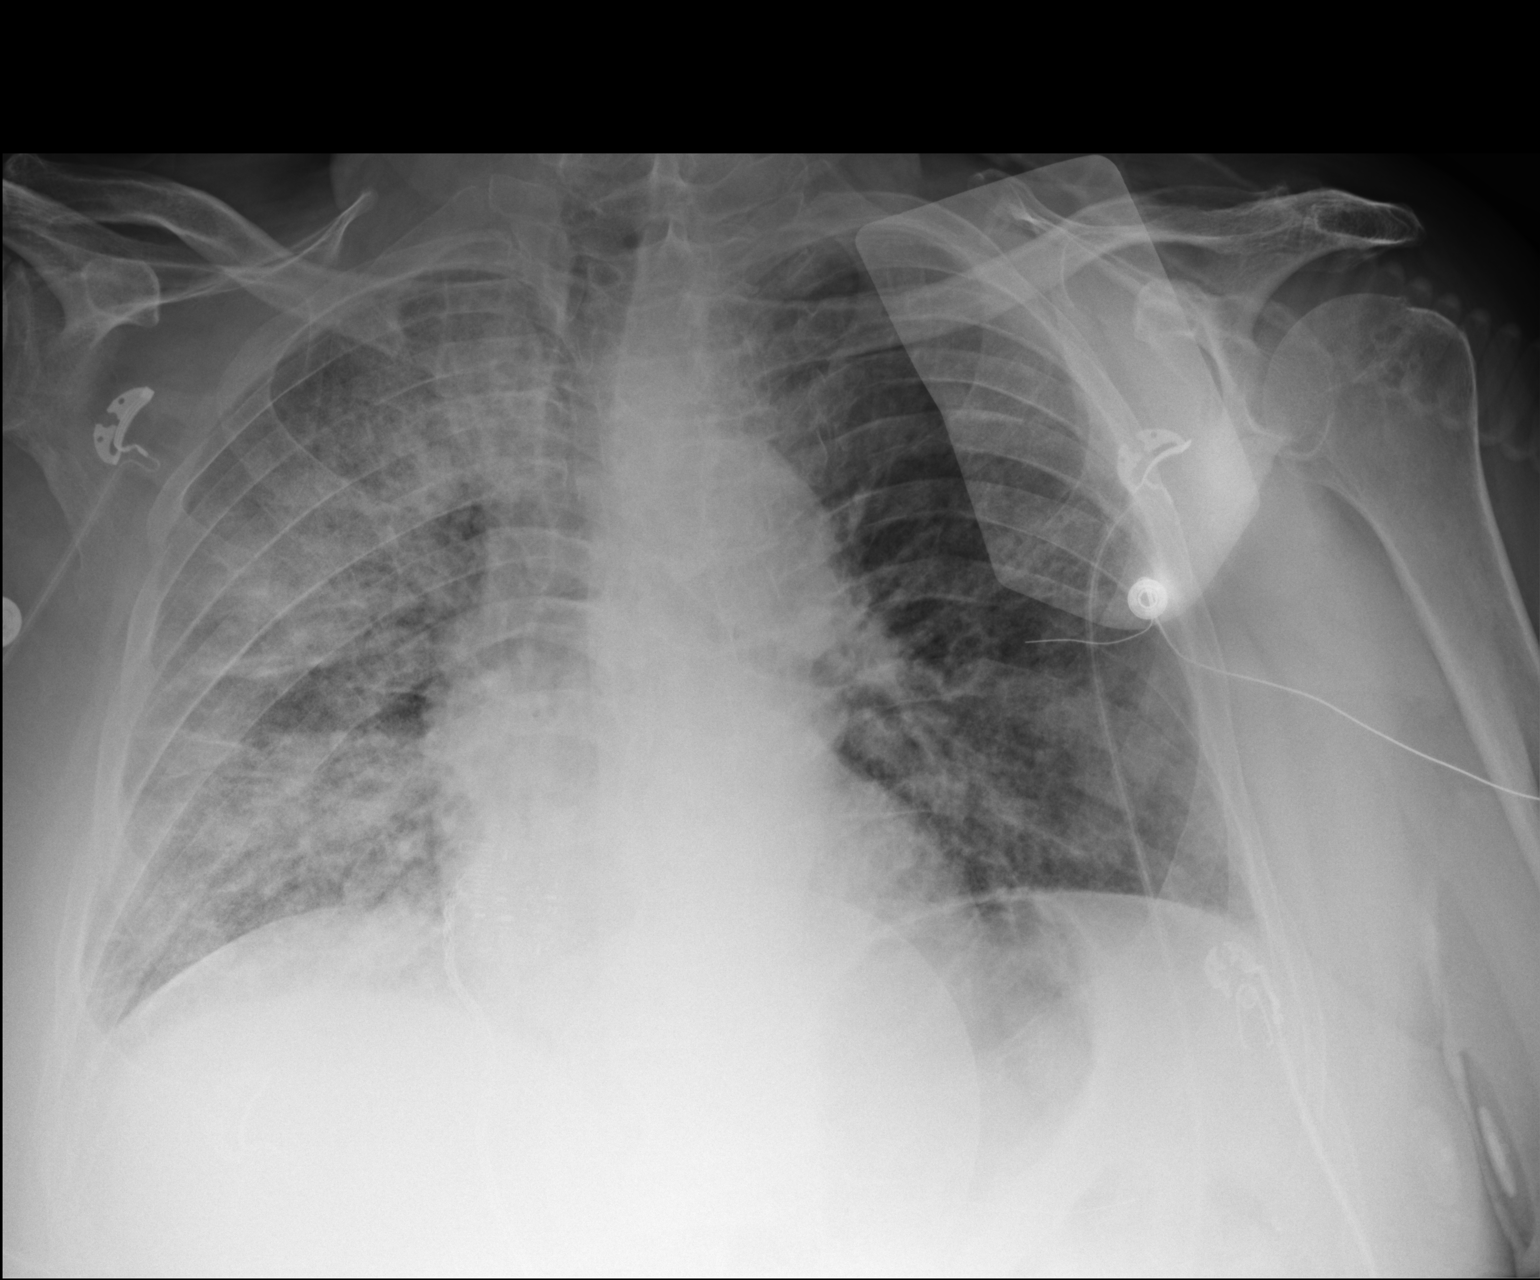

[1 of 1 positions shown; findings below may reference images not displayed]

FINDINGS: Diffuse right-sided airspace opacity and mild left basilar airspace
opacity are seen, concerning for multifocal pneumonia. Asymmetric
pulmonary edema could have a similar appearance, though it is
considered less likely. No pleural effusion or pneumothorax is seen.

The cardiomediastinal silhouette remains normal in size. No acute
osseous abnormalities are identified. External pacing pads are seen.
IMPRESSION: Diffuse right-sided airspace opacity and mild left basilar airspace
opacity, concerning for multifocal pneumonia. Asymmetric pulmonary
edema could have a similar appearance, though it is considered less
likely.

## 2018-10-01 IMAGING — CR DG CHEST 1V PORT
1 series · 1 of 1 positions shown · non-contrast
Comparison: 12/20/2015.

CLINICAL DATA: Pulmonary edema.

EXAM:
PORTABLE CHEST 1 VIEW

[AP]
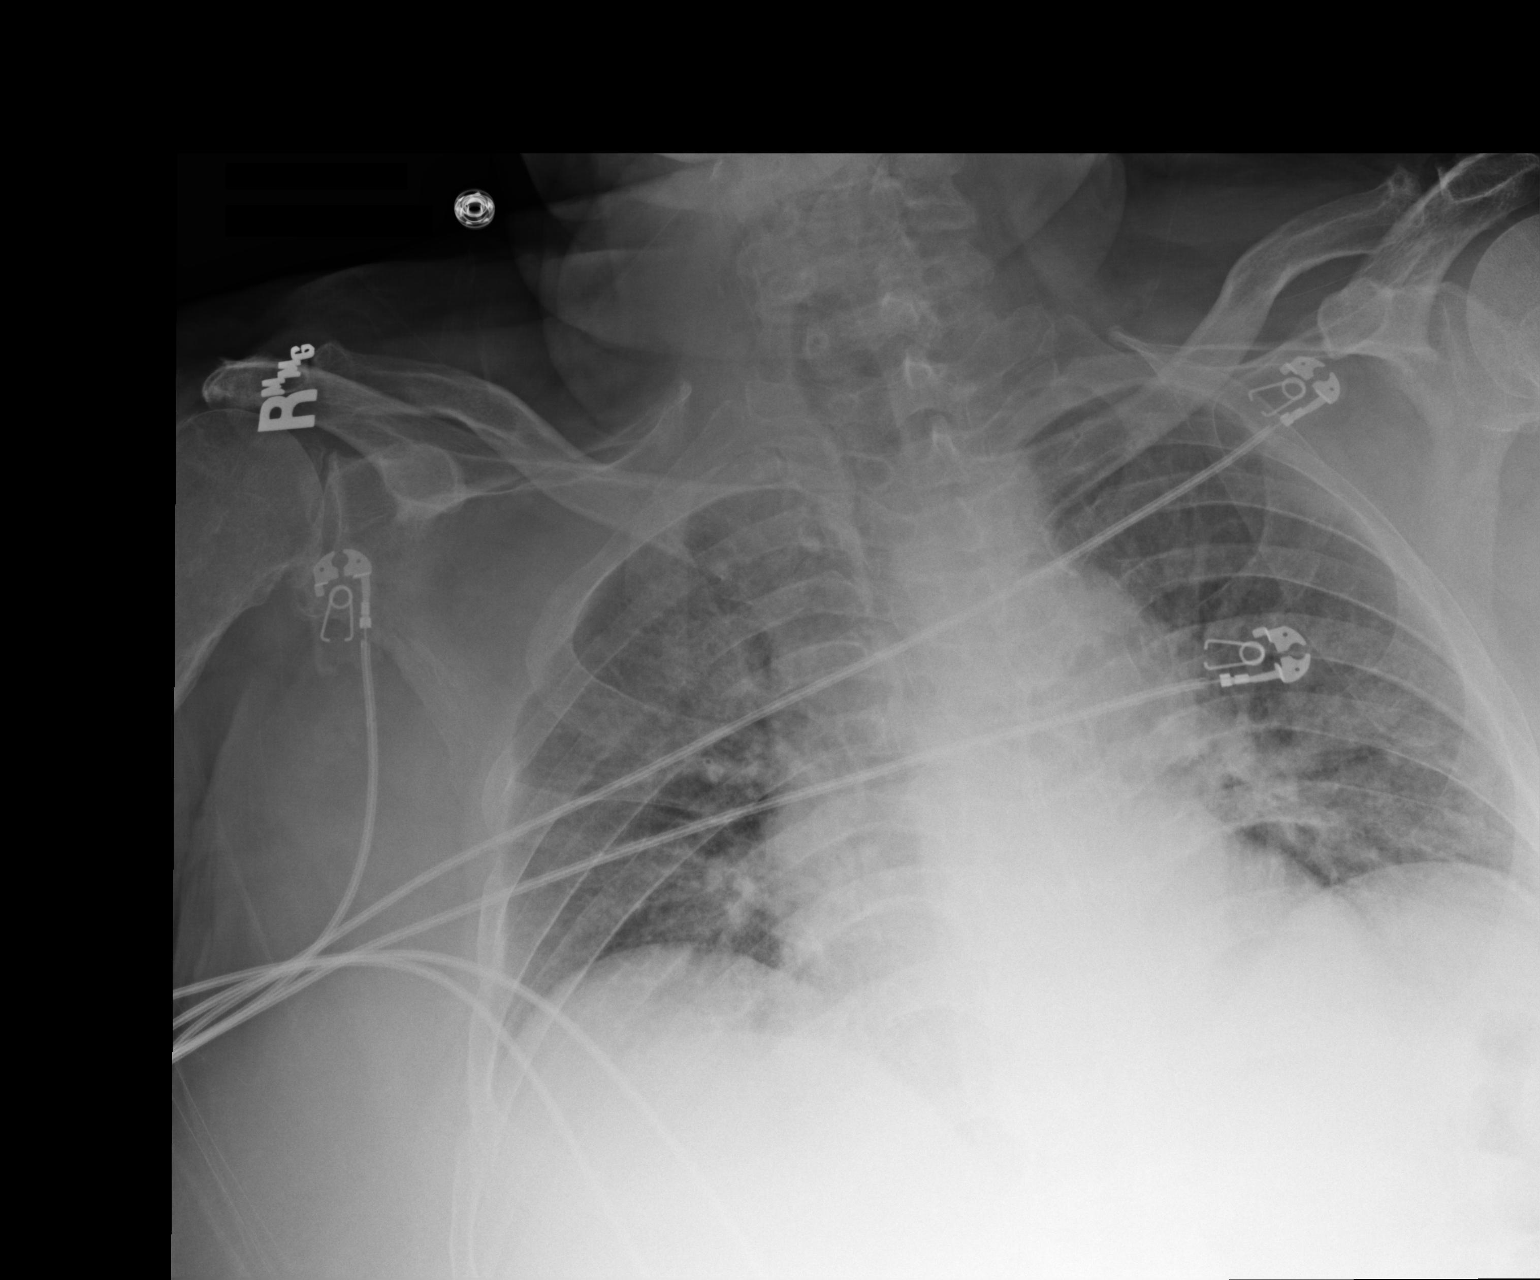

[1 of 1 positions shown; findings below may reference images not displayed]

FINDINGS: Cardiomegaly. Multifocal bilateral pulmonary infiltrates and/or
edema again noted. Partial interval clearing. No pleural effusion or
pneumothorax.
IMPRESSION: Multifocal bilateral pulmonary infiltrates and/or edema again noted.
Partial interval clearing.

## 2018-10-02 DIAGNOSIS — I455 Other specified heart block: Secondary | ICD-10-CM | POA: Diagnosis not present

## 2018-10-02 DIAGNOSIS — R55 Syncope and collapse: Secondary | ICD-10-CM | POA: Diagnosis not present

## 2018-10-03 ENCOUNTER — Other Ambulatory Visit: Payer: Self-pay

## 2018-10-04 ENCOUNTER — Encounter: Payer: Self-pay | Admitting: *Deleted

## 2018-10-04 NOTE — Telephone Encounter (Signed)
This encounter was created in error - please disregard.

## 2018-10-05 ENCOUNTER — Other Ambulatory Visit: Payer: Self-pay

## 2018-10-06 ENCOUNTER — Encounter: Payer: Self-pay | Admitting: Family Medicine

## 2018-10-06 ENCOUNTER — Other Ambulatory Visit: Payer: Self-pay | Admitting: Neurology

## 2018-10-06 ENCOUNTER — Ambulatory Visit (INDEPENDENT_AMBULATORY_CARE_PROVIDER_SITE_OTHER): Payer: Medicare Other | Admitting: Family Medicine

## 2018-10-06 DIAGNOSIS — I480 Paroxysmal atrial fibrillation: Secondary | ICD-10-CM

## 2018-10-06 DIAGNOSIS — I6523 Occlusion and stenosis of bilateral carotid arteries: Secondary | ICD-10-CM

## 2018-10-06 LAB — PT WITH INR/FINGERSTICK
INR, fingerstick: 3 ratio — ABNORMAL HIGH
PT, fingerstick: 35.4 s — ABNORMAL HIGH (ref 10.5–13.1)

## 2018-10-06 NOTE — Progress Notes (Signed)
Subjective:    Patient ID: [name], male    DOB: Jul 15, 1933, 85 y.o.   MRN: [id number]  HPI Patient is here today to recheck his Coumadin.  He is currently taking 1-1/2 tablets on Tuesday, Thursday, and Saturday.  He is taking 1 tablet on all other days.  His INR today in the office is 3.0 and therapeutic.  He denies any bleeding or bruising.  His blood pressure today is well controlled.  He is actually in normal sinus rhythm on exam.  He denies any chest pain shortness of breath or dyspnea on exertion.  He did question me as to when he is going to see the specialist in [place] regarding his NPH.  I explained to the patient that this is at the discretion of his neurologist Dr. Jannifer Franklin and the discussion with his family.  I have recommended that he discuss this with his family and Dr. Jannifer Franklin. Past Medical History:  Diagnosis Date  . Atrial flutter ([place])   . BPH (benign prostatic hyperplasia)   . Coronary artery disease    a. s/p MI tx with Cypher DES to pRCA in 4/04;  b. Echocardiogram 7/09: Normal LV function.  c. Nuclear study 3/13 no ischemia;  d. ETT 2/14 neg;  e. admx with CP => LHC (01/30/2013):  pLAD 40-50%, oD1 70-80%, oOM1 40%, pRCA stent patent, mid RCA 30%, EF 60-65%. => med Rx.  . DDD (degenerative disc disease)   . Diabetes mellitus without complication ([place])   . Dyslipidemia   . Gait disorder 07/13/2018  . Hemorrhoids   . Hx MRSA infection    left buttocks abscess  . Hx of echocardiogram    a. Echocardiogram (01/31/2013): Mild focal basal and mild concentric hypertrophy of the septum, EF 50-55%, normal wall motion, grade 1 diastolic dysfunction, trivial AI, MAC, mild LAE, PASP 35  . Hyperlipidemia   . Hypertension   . Hypothyroidism   . Meniere's disease    Status post shunt  . Myocardial infarction ([place]) 2008  . Occlusion and stenosis of carotid artery without mention of cerebral infarction    40-59% on carotid doppler 2014; [place] (01/2013): R 1-39%, L 60-79%  .  Rotator cuff injury    chronic rotator cuff injury status post repair  . Seizure disorder ([place])   . Stroke [facility])    a. 01/2013=> post cardiac cath CVA to L post communicating artery system; R sided weakness  . Syncope    Past Surgical History:  Procedure Laterality Date  . COLONOSCOPY    . CORONARY ANGIOPLASTY WITH STENT PLACEMENT  07/14/2002   Stent to the right coronary artery  . ENDOLYMPHATIC SHUNT DECOMPRESSION  06/11/2009    Right endolymphatic sac decompression and shunt  placement  . HERNIA REPAIR  04/10/2008   scrotal hernia repair  . LEFT HEART CATHETERIZATION WITH CORONARY ANGIOGRAM N/A 01/30/2013   Procedure: LEFT HEART CATHETERIZATION WITH CORONARY ANGIOGRAM;  Surgeon: Larey Dresser, MD;  Location: [facility] CATH LAB;  Service: Cardiovascular;  Laterality: N/A;  . ROTATOR CUFF REPAIR     for chronic rotator cuff injury   Current Outpatient Medications on File Prior to Visit  Medication Sig Dispense Refill  . acetaminophen (TYLENOL) 325 MG tablet Take 1-2 tablets (325-650 mg total) by mouth every 4 (four) hours as needed for mild pain.    Marland Kitchen albuterol (PROVENTIL HFA;VENTOLIN HFA) 108 (90 Base) MCG/ACT inhaler Inhale 2 puffs into the lungs every 4 (four) hours as needed for wheezing or shortness  of breath. 1 Inhaler 0  . atorvastatin (LIPITOR) 80 MG tablet Take 1 tablet (80 mg total) by mouth daily at 6 PM. 90 tablet 1  . carbamazepine (CARBATROL) 300 MG 12 hr capsule Take 1 capsule (300 mg total) by mouth 2 (two) times daily. 30 capsule 0  . cloNIDine (CATAPRES) 0.2 MG tablet Take 1 tablet (0.2 mg total) by mouth 2 (two) times daily. 60 tablet 1  . escitalopram (LEXAPRO) 5 MG tablet TAKE 1 TABLET BY MOUTH EVERYDAY AT BEDTIME 90 tablet 1  . furosemide (LASIX) 20 MG tablet Take 0.5 tablets (10 mg total) by mouth daily. 45 tablet 3  . glucose blood test strip 1 each by Other route as needed. Use as instructed 100 each 3  . glucose monitoring kit (FREESTYLE) monitoring kit 1 each by  Does not apply route as needed. 1 each 0  . isosorbide mononitrate (IMDUR) 60 MG 24 hr tablet Take 1 tablet (60 mg total) by mouth daily. 90 tablet 3  . Lancets (ACCU-CHEK SAFE-T PRO) lancets 1 each by Other route as needed. Use as instructed 100 each 3  . levETIRAcetam (KEPPRA) 250 MG tablet Take 1 tablet (250 mg total) by mouth 2 (two) times daily. 60 tablet 3  . levothyroxine (SYNTHROID, LEVOTHROID) 75 MCG tablet TAKE 1 TABLET BY MOUTH EVERY DAY (Patient taking differently: Take 75 mcg by mouth daily. ) 90 tablet 3  . losartan (COZAAR) 100 MG tablet Take 1 tablet (100 mg total) by mouth daily. 90 tablet 3  . metFORMIN (GLUCOPHAGE) 500 MG tablet TAKE 1/2 TABLET (250 MG) BY MOUTH 2 TIMES DAILY WITH A MEAL. 30 tablet 5  . Multiple Vitamins-Minerals (MULTIVITAMINS THER. W/MINERALS) TABS Take 1 tablet by mouth at bedtime.      Marland Kitchen warfarin (COUMADIN) 7.5 MG tablet TAKE 1&1/2 TABS TUESDAY, THURSDAY & SATURDAY*1 TAB ON SUNDAY, MONDAY, WEDNESDAY & FRIDAY WITH SUPPER 240 tablet 1   No current facility-administered medications on file prior to visit.    Allergies  Allergen Reactions  . Penicillins     Unknown reaction   Social History   Socioeconomic History  . Marital status: Married    Spouse name: Not on file  . Number of children: 4  . Years of education: 12th  . Highest education level: Not on file  Occupational History  . Occupation: Retired  Scientific laboratory technician  . Financial resource strain: Not on file  . Food insecurity    Worry: Not on file    Inability: Not on file  . Transportation needs    Medical: Not on file    Non-medical: Not on file  Tobacco Use  . Smoking status: Former Smoker    Years: 5.00    Types: Cigarettes    Quit date: 08/19/1956    Years since quitting: 62.1  . Smokeless tobacco: Former Systems developer    Types: Chew  Substance and Sexual Activity  . Alcohol use: No  . Drug use: No  . Sexual activity: Never  Lifestyle  . Physical activity    Days per week: Not on file     Minutes per session: Not on file  . Stress: Not on file  Relationships  . Social Herbalist on phone: Not on file    Gets together: Not on file    Attends religious service: Not on file    Active member of club or organization: Not on file    Attends meetings of clubs or organizations: Not on  file    Relationship status: Not on file  . Intimate partner violence    Fear of current or ex partner: Not on file    Emotionally abused: Not on file    Physically abused: Not on file    Forced sexual activity: Not on file  Other Topics Concern  . Not on file  Social History Narrative   Lives with wife.      Review of Systems  All other systems reviewed and are negative.      Objective:   Physical Exam Vitals signs reviewed.  Constitutional:      Appearance: Normal appearance. He is normal weight.  Cardiovascular:     Rate and Rhythm: Normal rate and regular rhythm.     Heart sounds: Normal heart sounds.  Pulmonary:     Effort: Pulmonary effort is normal. No respiratory distress.     Breath sounds: Normal breath sounds. No stridor. No wheezing, rhonchi or rales.  Neurological:     Mental Status: He is alert.           Assessment & Plan:  1. Paroxysmal atrial fibrillation (HCC) INR is therapeutic today at 3.0.  We will continue his Coumadin at his current dose.  He will take 1 tablet every day except Tuesday Thursday and Saturday and on Tuesday Thursday and Saturday he will take 1-1/2 tablets.  Recheck INR in 4 weeks - PT with INR/Fingerstick

## 2018-10-17 ENCOUNTER — Ambulatory Visit (INDEPENDENT_AMBULATORY_CARE_PROVIDER_SITE_OTHER): Payer: Medicare Other | Admitting: Neurology

## 2018-10-17 ENCOUNTER — Encounter: Payer: Self-pay | Admitting: Neurology

## 2018-10-17 ENCOUNTER — Other Ambulatory Visit: Payer: Self-pay

## 2018-10-17 VITALS — BP 158/88 | HR 63 | Temp 98.0°F | Ht 68.0 in | Wt 203.0 lb

## 2018-10-17 DIAGNOSIS — G40909 Epilepsy, unspecified, not intractable, without status epilepticus: Secondary | ICD-10-CM | POA: Diagnosis not present

## 2018-10-17 DIAGNOSIS — I6523 Occlusion and stenosis of bilateral carotid arteries: Secondary | ICD-10-CM | POA: Diagnosis not present

## 2018-10-17 NOTE — Progress Notes (Signed)
Reason for visit: Syncope, gait disorder  [name] is an 85 y.o. male  History of present illness:  [name] is an 85 year old right-handed white male with a history of atrial fibrillation, on Coumadin.  The patient has had some recent change in his ability to walk, he has had several falls and he has been using a walker since March 2020.  The patient had been seen on a virtual visit on 13 July 2018, he has had 3 syncopal events beginning in January 2020 with the last event occurring around 06 July 2018.  The patient had a recent MRI of the brain done in March 2020 that showed some ventriculomegaly that had changed over the last 6 years.  The patient has also had some increasing cortical atrophy as well.  The question of normal pressure hydrocephalus was entertained.  The patient himself today reports no significant change in his walking since March, he will use a walker outside of the house, he does not use it inside the house.  He has not had any further falls.  As a work-up for the syncope, he was sent for cardiac monitor study which showed up to 7-second pauses in his heart rhythm, he was taken off of beta-blockers and a repeat cardiac monitor study showed pauses up to 3.8 seconds.  He has not had any further blackout events.  He denies any severe issues with memory, he reports that he is able to control the bladder well.  He does have some occasional constipation.  He has some residual symptoms from a prior stroke on the right side, he has not had any new numbness or weakness of the face, arms, or legs.  He denies headaches or dizziness.   Past Medical History:  Diagnosis Date  . Atrial flutter ([place])   . BPH (benign prostatic hyperplasia)   . Coronary artery disease    a. s/p MI tx with Cypher DES to pRCA in 4/04;  b. Echocardiogram 7/09: Normal LV function.  c. Nuclear study 3/13 no ischemia;  d. ETT 2/14 neg;  e. admx with CP => LHC (01/30/2013):  pLAD 40-50%, oD1 70-80%, oOM1  40%, pRCA stent patent, mid RCA 30%, EF 60-65%. => med Rx.  . DDD (degenerative disc disease)   . Diabetes mellitus without complication ([place])   . Dyslipidemia   . Gait disorder 07/13/2018  . Hemorrhoids   . Hx MRSA infection    left buttocks abscess  . Hx of echocardiogram    a. Echocardiogram (01/31/2013): Mild focal basal and mild concentric hypertrophy of the septum, EF 50-55%, normal wall motion, grade 1 diastolic dysfunction, trivial AI, MAC, mild LAE, PASP 35  . Hyperlipidemia   . Hypertension   . Hypothyroidism   . Meniere's disease    Status post shunt  . Myocardial infarction ([place]) 2008  . Occlusion and stenosis of carotid artery without mention of cerebral infarction    40-59% on carotid doppler 2014; [place] (01/2013): R 1-39%, L 60-79%  . Rotator cuff injury    chronic rotator cuff injury status post repair  . Seizure disorder ([place])   . Stroke [facility])    a. 01/2013=> post cardiac cath CVA to L post communicating artery system; R sided weakness  . Syncope     Past Surgical History:  Procedure Laterality Date  . COLONOSCOPY    . CORONARY ANGIOPLASTY WITH STENT PLACEMENT  07/14/2002   Stent to the right coronary artery  . ENDOLYMPHATIC SHUNT DECOMPRESSION  06/11/2009    Right endolymphatic sac decompression and shunt  placement  . HERNIA REPAIR  04/10/2008   scrotal hernia repair  . LEFT HEART CATHETERIZATION WITH CORONARY ANGIOGRAM N/A 01/30/2013   Procedure: LEFT HEART CATHETERIZATION WITH CORONARY ANGIOGRAM;  Surgeon: Larey Dresser, MD;  Location: [facility] CATH LAB;  Service: Cardiovascular;  Laterality: N/A;  . ROTATOR CUFF REPAIR     for chronic rotator cuff injury    Family History  Problem Relation Age of Onset  . Heart attack Father 24  . Aneurysm Mother        brain aneurysm  . Coronary artery disease Brother        with CABG  . Diabetes Brother   . Colon cancer Neg Hx   . Colon polyps Neg Hx     Social history:  reports that he quit smoking about 62 years  ago. His smoking use included cigarettes. He quit after 5.00 years of use. He has quit using smokeless tobacco.  His smokeless tobacco use included chew. He reports that he does not drink alcohol or use drugs.    Allergies  Allergen Reactions  . Penicillins     Unknown reaction    Medications:  Prior to Admission medications   Medication Sig Start Date End Date Taking? Authorizing Provider  acetaminophen (TYLENOL) 325 MG tablet Take 1-2 tablets (325-650 mg total) by mouth every 4 (four) hours as needed for mild pain. 06/15/18  Yes Love, Ivan Anchors, PA-C  albuterol (PROVENTIL HFA;VENTOLIN HFA) 108 (90 Base) MCG/ACT inhaler Inhale 2 puffs into the lungs every 4 (four) hours as needed for wheezing or shortness of breath. 03/17/18  Yes Delsa Grana, PA-C  atorvastatin (LIPITOR) 80 MG tablet Take 1 tablet (80 mg total) by mouth daily at 6 PM. 07/17/18  Yes Pickard, Cammie Mcgee, MD  carbamazepine (CARBATROL) 300 MG 12 hr capsule Take 1 capsule (300 mg total) by mouth 2 (two) times daily. 06/15/18  Yes Love, Ivan Anchors, PA-C  cloNIDine (CATAPRES) 0.2 MG tablet Take 1 tablet (0.2 mg total) by mouth 2 (two) times daily. 06/15/18  Yes Love, Ivan Anchors, PA-C  escitalopram (LEXAPRO) 5 MG tablet TAKE 1 TABLET BY MOUTH EVERYDAY AT BEDTIME 07/10/18  Yes Meredith Staggers, MD  furosemide (LASIX) 20 MG tablet Take 0.5 tablets (10 mg total) by mouth daily. 08/25/18  Yes Meng, Isaac Laud, PA  glucose blood test strip 1 each by Other route as needed. Use as instructed 08/25/18  Yes Cavalier, Modena Nunnery, MD  glucose monitoring kit (FREESTYLE) monitoring kit 1 each by Does not apply route as needed. 08/25/18  Yes [place], Modena Nunnery, MD  isosorbide mononitrate (IMDUR) 60 MG 24 hr tablet Take 1 tablet (60 mg total) by mouth daily. 08/25/18  Yes Meng, Isaac Laud, PA  Lancets (ACCU-CHEK SAFE-T PRO) lancets 1 each by Other route as needed. Use as instructed 08/25/18  Yes [place], Modena Nunnery, MD  levETIRAcetam (KEPPRA) 250 MG tablet TAKE 1 TABLET BY MOUTH TWICE A  DAY 10/09/18  Yes Kathrynn Ducking, MD  levothyroxine (SYNTHROID, LEVOTHROID) 75 MCG tablet TAKE 1 TABLET BY MOUTH EVERY DAY Patient taking differently: Take 75 mcg by mouth daily.  04/13/18  Yes [name], MD  losartan (COZAAR) 100 MG tablet Take 1 tablet (100 mg total) by mouth daily. 08/25/18  Yes Almyra Deforest, PA  metFORMIN (GLUCOPHAGE) 500 MG tablet TAKE 1/2 TABLET (250 MG) BY MOUTH 2 TIMES DAILY WITH A MEAL. 08/07/18  Yes [name], MD  Multiple Vitamins-Minerals (MULTIVITAMINS THER. W/MINERALS) TABS Take 1 tablet by mouth at bedtime.     Yes [provider]  warfarin (COUMADIN) 7.5 MG tablet TAKE 1&1/2 TABS TUESDAY, THURSDAY & SATURDAY*1 TAB ON SUNDAY, MONDAY, WEDNESDAY & FRIDAY WITH SUPPER 08/16/18  Yes [name], MD    ROS:  Out of a complete 14 system review of symptoms, the patient complains only of the following symptoms, and all other reviewed systems are negative.  Walking problems Blackouts Constipation  Blood pressure (!) 158/88, pulse 63, temperature 98 F (36.7 C), temperature source Temporal, height _0  (1.727 m), weight 203 lb (92.1 kg).  Physical Exam  General: The patient is alert and cooperative at the time of the examination.  The patient is moderately obese.  Respiratory: Lung fields are clear.  Cardiovascular: Irregular heart rhythm, no obvious murmurs or rubs noted.  Neck: Neck is supple, no carotid bruits are noted.  Eyes: Pupils are postsurgical, discs are flat bilaterally.  Extraocular movements are full.  Skin: 1+ edema below the knees is seen bilaterally.   Neurologic Exam  Mental status: The patient is alert and oriented x 3 at the time of the examination. The Mini-Mental status examination done today shows a total score 27/30.   Cranial nerves: Facial symmetry is present. Speech is normal, no aphasia or dysarthria is noted. Extraocular movements are full. Visual fields are full.  Motor: The patient has good  strength in all 4 extremities.  Sensory examination: Soft touch sensation is symmetric on the face, arms, and legs.  Pinprick sensation and vibration sensation are symmetric throughout.  Position sensation is decreased on the right foot relative to the left, symmetric on the arms.  Coordination: The patient has good finger-nose-finger and heel-to-shin bilaterally.  Gait and station: The patient has a minimally wide-based gait, he can walk independently.  Tandem gait is slightly unsteady.  Romberg is negative.  Reflexes: Deep tendon reflexes are symmetric, reflexes are well-maintained throughout including ankle jerk reflexes.  Toes are neutral bilaterally.   MRI brain 05/30/18:  IMPRESSION: MRI HEAD:  1. Motion degraded examination.  No acute intracranial process. 2. Old LEFT occipital/PCA territory infarct. Old small basal ganglia and LEFT thalamus infarcts. 3. Moderate chronic small vessel ischemic changes. 4. 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 It has  MRA HEAD:  1. No emergent large vessel occlusion or flow-limiting stenosis. 2. Chronically occluded LEFT PCA. Moderate stenosis RIGHT P2 segment.   * MRI scan images were reviewed online. I agree with the written report.    Assessment/Plan:  1.  Gait disorder, possible normal pressure hydrocephalus  2.  Episodic syncope  The patient has not had any further episodes of syncope.  He remains on Coumadin.  The patient does have a mild gait disorder, but he is still able to walk independently.  The risk of taking him off of Coumadin and potentially doing a VP shunt may be higher than the potential benefits at this time.  I would prefer to watch the gait issue over time, if it continues to deteriorate, we can do the spinal tap later.  The patient appears to have some enlargement of the ventricular system on the most recent MRI scan as compared to a prior study done 6 years previously, but the degree of  cortical atrophy is also increased.  We will follow the memory issues as well over time.  Further blood work may be done in the future.  Jill Alexanders MD 10/17/2018 12:31  PM  [facility] [address] [place], [place] [address]  Phone [phone] Fax [phone]

## 2018-10-19 ENCOUNTER — Ambulatory Visit: Payer: Medicare Other | Admitting: Neurology

## 2018-11-06 ENCOUNTER — Other Ambulatory Visit: Payer: Self-pay | Admitting: Physical Medicine and Rehabilitation

## 2018-11-06 ENCOUNTER — Ambulatory Visit: Payer: Medicare Other | Admitting: Family Medicine

## 2018-11-07 ENCOUNTER — Other Ambulatory Visit: Payer: Self-pay

## 2018-11-07 ENCOUNTER — Encounter: Payer: Self-pay | Admitting: Family Medicine

## 2018-11-07 ENCOUNTER — Ambulatory Visit (INDEPENDENT_AMBULATORY_CARE_PROVIDER_SITE_OTHER): Payer: Medicare Other | Admitting: Family Medicine

## 2018-11-07 DIAGNOSIS — I480 Paroxysmal atrial fibrillation: Secondary | ICD-10-CM | POA: Diagnosis not present

## 2018-11-07 DIAGNOSIS — I6523 Occlusion and stenosis of bilateral carotid arteries: Secondary | ICD-10-CM

## 2018-11-07 LAB — PT WITH INR/FINGERSTICK
INR, fingerstick: 3.3 ratio — ABNORMAL HIGH
PT, fingerstick: 39.5 s — ABNORMAL HIGH (ref 10.5–13.1)

## 2018-11-07 NOTE — Progress Notes (Signed)
Subjective:    Patient ID: [name], male    DOB: [birth year], 85 y.o.   MRN: [id number]  HPI  10/06/18 Patient is here today to recheck his Coumadin.  He is currently taking 1-1/2 tablets on Tuesday, Thursday, and Saturday.  He is taking 1 tablet on all other days.  His INR today in the office is 3.0 and therapeutic.  He denies any bleeding or bruising.  His blood pressure today is well controlled.  He is actually in normal sinus rhythm on exam.  He denies any chest pain shortness of breath or dyspnea on exertion.  He did question me as to when he is going to see the specialist in [place] regarding his NPH.  I explained to the patient that this is at the discretion of his neurologist Dr. Jannifer Franklin and the discussion with his family.  I have recommended that he discuss this with his family and Dr. Jannifer Franklin.  At that time, my plan was: INR is therapeutic today at 3.0.  We will continue his Coumadin at his current dose.  He will take 1 tablet every day except Tuesday Thursday and Saturday and on Tuesday Thursday and Saturday he will take 1-1/2 tablets.  Recheck INR in 4 weeks - PT with INR/Fingerstick  11/07/18 Patient is here today to recheck his INR.  INR is slightly supratherapeutic at 3.3.  He denies any bleeding or bruising.  He denies any changes in his medication.   Past Medical History:  Diagnosis Date  . Atrial flutter ([place])   . BPH (benign prostatic hyperplasia)   . Coronary artery disease    a. s/p MI tx with Cypher DES to pRCA in 4/04;  b. Echocardiogram 7/09: Normal LV function.  c. Nuclear study 3/13 no ischemia;  d. ETT 2/14 neg;  e. admx with CP => LHC (01/30/2013):  pLAD 40-50%, oD1 70-80%, oOM1 40%, pRCA stent patent, mid RCA 30%, EF 60-65%. => med Rx.  . DDD (degenerative disc disease)   . Diabetes mellitus without complication ([place])   . Dyslipidemia   . Gait disorder 07/13/2018  . Hemorrhoids   . Hx MRSA infection    left buttocks abscess  . Hx of echocardiogram    a.  Echocardiogram (01/31/2013): Mild focal basal and mild concentric hypertrophy of the septum, EF 50-55%, normal wall motion, grade 1 diastolic dysfunction, trivial AI, MAC, mild LAE, PASP 35  . Hyperlipidemia   . Hypertension   . Hypothyroidism   . Meniere's disease    Status post shunt  . Myocardial infarction ([place]) 2008  . Occlusion and stenosis of carotid artery without mention of cerebral infarction    40-59% on carotid doppler 2014; [place] (01/2013): R 1-39%, L 60-79%  . Rotator cuff injury    chronic rotator cuff injury status post repair  . Seizure disorder ([place])   . Stroke [facility])    a. 01/2013=> post cardiac cath CVA to L post communicating artery system; R sided weakness  . Syncope    Past Surgical History:  Procedure Laterality Date  . COLONOSCOPY    . CORONARY ANGIOPLASTY WITH STENT PLACEMENT  07/14/2002   Stent to the right coronary artery  . ENDOLYMPHATIC SHUNT DECOMPRESSION  06/11/2009    Right endolymphatic sac decompression and shunt  placement  . HERNIA REPAIR  04/10/2008   scrotal hernia repair  . LEFT HEART CATHETERIZATION WITH CORONARY ANGIOGRAM N/A 01/30/2013   Procedure: LEFT HEART CATHETERIZATION WITH CORONARY ANGIOGRAM;  Surgeon: Larey Dresser, MD;  Location: [place] CATH LAB;  Service: Cardiovascular;  Laterality: N/A;  . ROTATOR CUFF REPAIR     for chronic rotator cuff injury   Current Outpatient Medications on File Prior to Visit  Medication Sig Dispense Refill  . acetaminophen (TYLENOL) 325 MG tablet Take 1-2 tablets (325-650 mg total) by mouth every 4 (four) hours as needed for mild pain.    Marland Kitchen albuterol (PROVENTIL HFA;VENTOLIN HFA) 108 (90 Base) MCG/ACT inhaler Inhale 2 puffs into the lungs every 4 (four) hours as needed for wheezing or shortness of breath. 1 Inhaler 0  . atorvastatin (LIPITOR) 80 MG tablet Take 1 tablet (80 mg total) by mouth daily at 6 PM. 90 tablet 1  . carbamazepine (CARBATROL) 300 MG 12 hr capsule Take 1 capsule (300 mg total) by mouth 2  (two) times daily. 30 capsule 0  . cloNIDine (CATAPRES) 0.2 MG tablet Take 1 tablet (0.2 mg total) by mouth 2 (two) times daily. 60 tablet 1  . escitalopram (LEXAPRO) 5 MG tablet TAKE 1 TABLET BY MOUTH EVERYDAY AT BEDTIME 90 tablet 1  . furosemide (LASIX) 20 MG tablet Take 0.5 tablets (10 mg total) by mouth daily. 45 tablet 3  . glucose blood test strip 1 each by Other route as needed. Use as instructed 100 each 3  . glucose monitoring kit (FREESTYLE) monitoring kit 1 each by Does not apply route as needed. 1 each 0  . isosorbide mononitrate (IMDUR) 60 MG 24 hr tablet Take 1 tablet (60 mg total) by mouth daily. 90 tablet 3  . Lancets (ACCU-CHEK SAFE-T PRO) lancets 1 each by Other route as needed. Use as instructed 100 each 3  . levETIRAcetam (KEPPRA) 250 MG tablet TAKE 1 TABLET BY MOUTH TWICE A DAY 180 tablet 1  . levothyroxine (SYNTHROID, LEVOTHROID) 75 MCG tablet TAKE 1 TABLET BY MOUTH EVERY DAY (Patient taking differently: Take 75 mcg by mouth daily. ) 90 tablet 3  . losartan (COZAAR) 100 MG tablet Take 1 tablet (100 mg total) by mouth daily. 90 tablet 3  . metFORMIN (GLUCOPHAGE) 500 MG tablet TAKE 1/2 TABLET (250 MG) BY MOUTH 2 TIMES DAILY WITH A MEAL. 30 tablet 5  . Multiple Vitamins-Minerals (MULTIVITAMINS THER. W/MINERALS) TABS Take 1 tablet by mouth at bedtime.      Marland Kitchen warfarin (COUMADIN) 7.5 MG tablet TAKE 1&1/2 TABS TUESDAY, THURSDAY & SATURDAY*1 TAB ON SUNDAY, MONDAY, WEDNESDAY & FRIDAY WITH SUPPER 240 tablet 1   No current facility-administered medications on file prior to visit.    Allergies  Allergen Reactions  . Penicillins     Unknown reaction   Social History   Socioeconomic History  . Marital status: Married    Spouse name: Not on file  . Number of children: 4  . Years of education: 12th  . Highest education level: Not on file  Occupational History  . Occupation: Retired  Scientific laboratory technician  . Financial resource strain: Not on file  . Food insecurity    Worry: Not on  file    Inability: Not on file  . Transportation needs    Medical: Not on file    Non-medical: Not on file  Tobacco Use  . Smoking status: Former Smoker    Years: 5.00    Types: Cigarettes    Quit date: 08/19/1956    Years since quitting: 62.2  . Smokeless tobacco: Former Systems developer    Types: Chew  Substance and Sexual Activity  . Alcohol use: No  . Drug use: No  .  Sexual activity: Never  Lifestyle  . Physical activity    Days per week: Not on file    Minutes per session: Not on file  . Stress: Not on file  Relationships  . Social Herbalist on phone: Not on file    Gets together: Not on file    Attends religious service: Not on file    Active member of club or organization: Not on file    Attends meetings of clubs or organizations: Not on file    Relationship status: Not on file  . Intimate partner violence    Fear of current or ex partner: Not on file    Emotionally abused: Not on file    Physically abused: Not on file    Forced sexual activity: Not on file  Other Topics Concern  . Not on file  Social History Narrative   Lives with wife.      Review of Systems  All other systems reviewed and are negative.      Objective:   Physical Exam Vitals signs reviewed.  Constitutional:      Appearance: Normal appearance. He is normal weight.  Cardiovascular:     Rate and Rhythm: Normal rate and regular rhythm.     Heart sounds: Normal heart sounds.  Pulmonary:     Effort: Pulmonary effort is normal. No respiratory distress.     Breath sounds: Normal breath sounds. No stridor. No wheezing, rhonchi or rales.  Neurological:     Mental Status: He is alert.           Assessment & Plan:  1. Paroxysmal atrial fibrillation ([place]) I have asked the patient to hold his Coumadin for 2 doses and then resume his previous dosing we will recheck his INR in 4 weeks.  If still elevated at that time we will need to permanently reduce his Coumadin dose slightly.  I would  suggest switching it to 1 pill every day except Tuesday and Thursday and on Tuesday and Thursday give him 1-1/2 pills if his Coumadin/INR test is elevated in 4 weeks.

## 2018-12-03 NOTE — Progress Notes (Deleted)
Cardiology Office Note   Date:  12/03/2018   ID:  [name], DOB [birth year], MRN [id number]  PCP:  [name], MD  Cardiologist:   Minus Breeding, MD Referring:  ***  No chief complaint on file.     History of Present Illness: [name] is a 85 y.o. male who presents for follow up of CAD and CVA following a cath in 2014.  He has also had atrial flutter.  He was admitted this year with syncope.  He had hydrocephalus.  He had readmission in May again with syncope.  The etiology was not clear.  He did have atrial fib with short pauses and bradycardia and was taken off of beta blockers.  He has also been treated for diastolic HF.   ***     [name] is a 85 y.o. male with PMH of CAD, CVA, atrial flutter, hyperlipidemia, DM 2, hypertension, hypothyroidism and carotid artery disease.  Patient had a CVA following his 2014 cardiac catheterization.  This was felt to be embolic from the procedure.  Despite his significant recovery, he continued to have right-sided arm and leg weakness with right visual deficit.  He is on Coumadin for atrial flutter.  He was switched from Xarelto due to possible interaction with Tegretol.  He had a previous Holter monitor placed in August 2017 that showed sinus rhythm with sinus tachycardia but no atrial flutter.  Renal artery duplex obtained in October 2017 was normal.  He had hydrocephalus.  Echo with normal EF.    More recently, he was admitted for over 2 weeks in March with syncope, mental status change and abnormal chewing moment as well as eye fluttering.  He was treated with IVF however continued to be symptomatic.  EEG was negative for seizure.  MRI revealed findings consistent with mild chronic complicating hydrocephalus.  Carotid Doppler obtained on 05/30/2018 showed 1 to 39% bilateral ICA disease.  Echocardiogram on obtained on the same day showed EF 60 to 65%, moderate mitral annular calcification.  Hospitalization was complicated  by E. coli UTI and was treated with Keflex for 5 days.  Lasix was stopped during that admission.  Patient was readmitted near the end of May for recurrent syncope episode after falling out of bed and complained of shortness of breath and chest pain.  During the hospitalization, he was hypertensive and tachypneic.  Chest pain was felt to be related to acute heart failure.  Chest x-ray showed interstitial edema, Lasix was restarted.  He was diuresed 3 L.  Apparently he was on Eliquis when he went to the hospital, due to interaction with his carbamazepine, he was switched back to Coumadin.   Based on recent telephone note, he apparently has been having pauses along with his A. fib on the heart monitor.  His metoprolol 150m BID was discontinued completely.  Patient presents today for cardiology office visit.  He denies any recent chest discomfort, shortness of breath, or dizziness.  His blood pressure is elevated at 152/70.  Initial pulse was 48, however EKG strip shows heart rate was in fact in the 60s.  It appears he has heart rate has improved after discontinuation of the metoprolol.  I will keep him on the current regiment.  Unfortunately he is weight steady increase by 5 pounds since discharge.  On physical exam, I did not find any swelling in the leg or crackles in the lungs.  I did increase his Lasix to 10 mg daily.  For his high blood pressure, I increased his Imdur to 60 mg daily and also increased his losartan to 100 mg daily.  He will need a basic metabolic panel at PCPs office in 1 week.  We will follow-up with the patient in 2 months.  Past Medical History:  Diagnosis Date   Atrial flutter ([place])    BPH (benign prostatic hyperplasia)    Coronary artery disease    a. s/p MI tx with Cypher DES to pRCA in 4/04;  b. Echocardiogram 7/09: Normal LV function.  c. Nuclear study 3/13 no ischemia;  d. ETT 2/14 neg;  e. admx with CP => LHC (01/30/2013):  pLAD 40-50%, oD1 70-80%, oOM1 40%, pRCA stent  patent, mid RCA 30%, EF 60-65%. => med Rx.   DDD (degenerative disc disease)    Diabetes mellitus without complication ([place])    Dyslipidemia    Gait disorder 07/13/2018   Hemorrhoids    Hx MRSA infection    left buttocks abscess   Hx of echocardiogram    a. Echocardiogram (01/31/2013): Mild focal basal and mild concentric hypertrophy of the septum, EF 50-55%, normal wall motion, grade 1 diastolic dysfunction, trivial AI, MAC, mild LAE, PASP 35   Hyperlipidemia    Hypertension    Hypothyroidism    Meniere's disease    Status post shunt   Myocardial infarction [facility]) 2008   Occlusion and stenosis of carotid artery without mention of cerebral infarction    40-59% on carotid doppler 2014; [place] (01/2013): R 1-39%, L 60-79%   Rotator cuff injury    chronic rotator cuff injury status post repair   Seizure disorder ([place])    Stroke ([place])    a. 01/2013=> post cardiac cath CVA to L post communicating artery system; R sided weakness   Syncope     Past Surgical History:  Procedure Laterality Date   COLONOSCOPY     CORONARY ANGIOPLASTY WITH STENT PLACEMENT  07/14/2002   Stent to the right coronary artery   ENDOLYMPHATIC SHUNT DECOMPRESSION  06/11/2009    Right endolymphatic sac decompression and shunt  placement   HERNIA REPAIR  04/10/2008   scrotal hernia repair   LEFT HEART CATHETERIZATION WITH CORONARY ANGIOGRAM N/A 01/30/2013   Procedure: LEFT HEART CATHETERIZATION WITH CORONARY ANGIOGRAM;  Surgeon: Larey Dresser, MD;  Location: [facility] LAB;  Service: Cardiovascular;  Laterality: N/A;   ROTATOR CUFF REPAIR     for chronic rotator cuff injury     Current Outpatient Medications  Medication Sig Dispense Refill   acetaminophen (TYLENOL) 325 MG tablet Take 1-2 tablets (325-650 mg total) by mouth every 4 (four) hours as needed for mild pain.     albuterol (PROVENTIL HFA;VENTOLIN HFA) 108 (90 Base) MCG/ACT inhaler Inhale 2 puffs into the lungs every 4 (four) hours  as needed for wheezing or shortness of breath. 1 Inhaler 0   atorvastatin (LIPITOR) 80 MG tablet Take 1 tablet (80 mg total) by mouth daily at 6 PM. 90 tablet 1   carbamazepine (CARBATROL) 300 MG 12 hr capsule Take 1 capsule (300 mg total) by mouth 2 (two) times daily. 30 capsule 0   cloNIDine (CATAPRES) 0.2 MG tablet Take 1 tablet (0.2 mg total) by mouth 2 (two) times daily. 60 tablet 1   escitalopram (LEXAPRO) 5 MG tablet TAKE 1 TABLET BY MOUTH EVERYDAY AT BEDTIME 90 tablet 1   furosemide (LASIX) 20 MG tablet Take 0.5 tablets (10 mg total) by mouth daily. 45 tablet 3   glucose blood  test strip 1 each by Other route as needed. Use as instructed 100 each 3   glucose monitoring kit (FREESTYLE) monitoring kit 1 each by Does not apply route as needed. 1 each 0   isosorbide mononitrate (IMDUR) 60 MG 24 hr tablet Take 1 tablet (60 mg total) by mouth daily. 90 tablet 3   Lancets (ACCU-CHEK SAFE-T PRO) lancets 1 each by Other route as needed. Use as instructed 100 each 3   levETIRAcetam (KEPPRA) 250 MG tablet TAKE 1 TABLET BY MOUTH TWICE A DAY 180 tablet 1   levothyroxine (SYNTHROID, LEVOTHROID) 75 MCG tablet TAKE 1 TABLET BY MOUTH EVERY DAY (Patient taking differently: Take 75 mcg by mouth daily. ) 90 tablet 3   losartan (COZAAR) 100 MG tablet Take 1 tablet (100 mg total) by mouth daily. 90 tablet 3   metFORMIN (GLUCOPHAGE) 500 MG tablet TAKE 1/2 TABLET (250 MG) BY MOUTH 2 TIMES DAILY WITH A MEAL. 30 tablet 5   Multiple Vitamins-Minerals (MULTIVITAMINS THER. W/MINERALS) TABS Take 1 tablet by mouth at bedtime.       warfarin (COUMADIN) 7.5 MG tablet TAKE 1&1/2 TABS TUESDAY, THURSDAY & SATURDAY*1 TAB ON SUNDAY, MONDAY, WEDNESDAY & FRIDAY WITH SUPPER 240 tablet 1   No current facility-administered medications for this visit.     Allergies:   Penicillins     ROS:  Please see the history of present illness.   Otherwise, review of systems are positive for {NONE DEFAULTED:18576::"none"}.    All other systems are reviewed and negative.    PHYSICAL EXAM: VS:  There were no vitals taken for this visit. , BMI There is no height or weight on file to calculate BMI. GENERAL:  Well appearing NECK:  No jugular venous distention, waveform within normal limits, carotid upstroke brisk and symmetric, no bruits, no thyromegaly LUNGS:  Clear to auscultation bilaterally CHEST:  Unremarkable HEART:  PMI not displaced or sustained,S1 and S2 within normal limits, no S3, no S4, no clicks, no rubs, *** murmurs ABD:  Flat, positive bowel sounds normal in frequency in pitch, no bruits, no rebound, no guarding, no midline pulsatile mass, no hepatomegaly, no splenomegaly EXT:  2 plus pulses throughout, no edema, no cyanosis no clubbing    ***GENERAL:  Well appearing HEENT:  Pupils equal round and reactive, fundi not visualized, oral mucosa unremarkable NECK:  No jugular venous distention, waveform within normal limits, carotid upstroke brisk and symmetric, no bruits, no thyromegaly LYMPHATICS:  No cervical, inguinal adenopathy LUNGS:  Clear to auscultation bilaterally BACK:  No CVA tenderness CHEST:  Unremarkable HEART:  PMI not displaced or sustained,S1 and S2 within normal limits, no S3, no S4, no clicks, no rubs, *** murmurs ABD:  Flat, positive bowel sounds normal in frequency in pitch, no bruits, no rebound, no guarding, no midline pulsatile mass, no hepatomegaly, no splenomegaly EXT:  2 plus pulses throughout, no edema, no cyanosis no clubbing SKIN:  No rashes no nodules NEURO:  Cranial nerves II through XII grossly intact, motor grossly intact throughout PSYCH:  Cognitively intact, oriented to person place and time    EKG:  EKG {ACTION; IS/IS JAS:[id number]} ordered today. The ekg ordered today demonstrates ***   Recent Labs: 06/01/2018: TSH 3.023 08/20/2018: B Natriuretic Peptide 315.0 08/21/2018: ALT 16; BUN 27; Creatinine, Ser 1.07; Hemoglobin 10.7; Magnesium 2.2; Platelets 244;  Potassium 3.7; Sodium 137    Lipid Panel    Component Value Date/Time   CHOL 179 05/30/2018 0440   TRIG 131 05/30/2018 0440  HDL 50 05/30/2018 0440   CHOLHDL 3.6 05/30/2018 0440   VLDL 26 05/30/2018 0440   LDLCALC 103 (H) 05/30/2018 0440      Wt Readings from Last 3 Encounters:  11/07/18 196 lb (88.9 kg)  10/17/18 203 lb (92.1 kg)  10/06/18 205 lb (93 kg)      Other studies Reviewed: Additional studies/ records that were reviewed today include: ***. Review of the above records demonstrates:  Please see elsewhere in the note.  ***   ASSESSMENT AND PLAN:  Atrial flutter with bradycardia:  ***  Occasionally has pauses up to 3.5 seconds.  There was a reported pause up to 7 seconds, however I was unable to locate this strip.  His 100 mg twice daily of metoprolol has been discontinued.  It has been about 48 hours since his last dose, his heart rate seems to be well controlled by itself, EKG strip today demonstrated fixed 3-1 atrial flutter with very regular heart rate.  I have also called his daughter at his request to give her update about medication changes as well since it is his family member who will manage his medication.  According to his daughter, 4 family members rotate to go to his house and take care of him.  Hypertension:   *** After 100 mg twice daily of metoprolol was discontinued, his blood pressure is understandably high.  I will increase his losartan to 100 mg daily and Imdur to 60 mg daily  Acute on chronic diastolic heart failure:  ***  he recently underwent IV diuresis with loss of 3 L.  Since his discharge, he has gained those weight back.  Therefore I plan to increase the Lasix to 10 mg daily.  He will need a basic metabolic panel in 1 week which he prefers to do at his PCPs office since it is closer to where he lives.    CAD:   ***  Denies any recent chest pain  History of CVA:    ***  No recurrence  Hypothyroidism: On Synthroid  Hyperlipidemia:    LDL  was *** On Lipitor 80 mg daily  Bilateral carotid artery disease: Recent carotid Doppler obtained on 05/30/2018 showed 1 to 39% disease bilaterally.  ***    Current medicines are reviewed at length with the patient today.  The patient {ACTIONS; HAS/DOES NOT HAVE:19233} concerns regarding medicines.  The following changes have been made:  {PLAN; NO CHANGE:13088:s}  Labs/ tests ordered today include: *** No orders of the defined types were placed in this encounter.    Disposition:   FU with ***    Signed, Minus Breeding, MD  12/03/2018 8:13 PM    [facility]

## 2018-12-05 ENCOUNTER — Ambulatory Visit: Payer: Medicare Other | Admitting: Family Medicine

## 2018-12-05 ENCOUNTER — Ambulatory Visit: Payer: Medicare Other | Admitting: Cardiology

## 2018-12-07 ENCOUNTER — Ambulatory Visit: Payer: Medicare Other | Admitting: Family Medicine

## 2018-12-11 ENCOUNTER — Other Ambulatory Visit: Payer: Self-pay

## 2018-12-12 ENCOUNTER — Ambulatory Visit (INDEPENDENT_AMBULATORY_CARE_PROVIDER_SITE_OTHER): Payer: Medicare Other | Admitting: Family Medicine

## 2018-12-12 ENCOUNTER — Encounter: Payer: Self-pay | Admitting: Family Medicine

## 2018-12-12 VITALS — BP 130/64 | HR 70 | Temp 97.4°F | Resp 18 | Ht 65.0 in | Wt 193.0 lb

## 2018-12-12 DIAGNOSIS — I6523 Occlusion and stenosis of bilateral carotid arteries: Secondary | ICD-10-CM

## 2018-12-12 DIAGNOSIS — Z23 Encounter for immunization: Secondary | ICD-10-CM | POA: Diagnosis not present

## 2018-12-12 DIAGNOSIS — L989 Disorder of the skin and subcutaneous tissue, unspecified: Secondary | ICD-10-CM

## 2018-12-12 DIAGNOSIS — D485 Neoplasm of uncertain behavior of skin: Secondary | ICD-10-CM

## 2018-12-12 DIAGNOSIS — I480 Paroxysmal atrial fibrillation: Secondary | ICD-10-CM | POA: Diagnosis not present

## 2018-12-12 LAB — PT WITH INR/FINGERSTICK
INR, fingerstick: 2.7 ratio — ABNORMAL HIGH
PT, fingerstick: 32.5 s — ABNORMAL HIGH (ref 10.5–13.1)

## 2018-12-12 NOTE — Progress Notes (Signed)
Subjective:    Patient ID: [name], male    DOB: 05/23/1933, 85 y.o.   MRN: [id number]  HPI  10/06/18 Patient is here today to recheck his Coumadin.  He is currently taking 1-1/2 tablets on Tuesday, Thursday, and Saturday.  He is taking 1 tablet on all other days.  His INR today in the office is 3.0 and therapeutic.  He denies any bleeding or bruising.  His blood pressure today is well controlled.  He is actually in normal sinus rhythm on exam.  He denies any chest pain shortness of breath or dyspnea on exertion.  He did question me as to when he is going to see the specialist in [place] regarding his NPH.  I explained to the patient that this is at the discretion of his neurologist Dr. Jannifer Franklin and the discussion with his family.  I have recommended that he discuss this with his family and Dr. Jannifer Franklin.  At that time, my plan was: INR is therapeutic today at 3.0.  We will continue his Coumadin at his current dose.  He will take 1 tablet every day except Tuesday Thursday and Saturday and on Tuesday Thursday and Saturday he will take 1-1/2 tablets.  Recheck INR in 4 weeks - PT with INR/Fingerstick  11/07/18 Patient is here today to recheck his INR.  INR is slightly supratherapeutic at 3.3.  He denies any bleeding or bruising.  He denies any changes in his medication.  At that time, my plan was: I have asked the patient to hold his Coumadin for 2 doses and then resume his previous dosing we will recheck his INR in 4 weeks.  If still elevated at that time we will need to permanently reduce his Coumadin dose slightly.  I would suggest switching it to 1 pill every day except Tuesday and Thursday and on Tuesday and Thursday give him 1-1/2 pills if his Coumadin/INR test is elevated in 4 weeks.  12/12/18 Patient is here today to recheck his Coumadin.  He is currently taking 1-1/2 tablets on Tuesday, Thursday, and Saturday.  He takes 1 tablet on all other days.  His INR today is 2.7 and therapeutic.  He  denies any bleeding or bruising.  He does have a lesion on the dorsum of his right forearm.  It is roughly 7 cm x 4 cm and appears to be a soft lipoma.  I have offered the patient a general surgery consultation for excision and removal if it is problematic.  At the present time the patient denies any issues with the lesion and defers a surgery referral.  However he does have 2 suspicious lesions on his left forearm.  One is a 8 mm hard white scaly papule with an erythematous base that appears to be an early squamous cell carcinoma versus actinic keratosis.  He also has a smaller 5 mm erythematous macule with white scale that appears to be an actinic keratosis also on the dorsum of his left forearm.  He would like cryotherapy treatment for this.   Past Medical History:  Diagnosis Date   Atrial flutter ([place])    BPH (benign prostatic hyperplasia)    Coronary artery disease    a. s/p MI tx with Cypher DES to pRCA in 4/04;  b. Echocardiogram 7/09: Normal LV function.  c. Nuclear study 3/13 no ischemia;  d. ETT 2/14 neg;  e. admx with CP => LHC (01/30/2013):  pLAD 40-50%, oD1 70-80%, oOM1 40%, pRCA stent patent, mid RCA 30%,  EF 60-65%. => med Rx.   DDD (degenerative disc disease)    Diabetes mellitus without complication ([place])    Dyslipidemia    Gait disorder 07/13/2018   Hemorrhoids    Hx MRSA infection    left buttocks abscess   Hx of echocardiogram    a. Echocardiogram (01/31/2013): Mild focal basal and mild concentric hypertrophy of the septum, EF 50-55%, normal wall motion, grade 1 diastolic dysfunction, trivial AI, MAC, mild LAE, PASP 35   Hyperlipidemia    Hypertension    Hypothyroidism    Meniere's disease    Status post shunt   Myocardial infarction [facility]) 2008   Occlusion and stenosis of carotid artery without mention of cerebral infarction    40-59% on carotid doppler 2014; [place] (01/2013): R 1-39%, L 60-79%   Rotator cuff injury    chronic rotator cuff injury status post  repair   Seizure disorder ([place])    Stroke ([place])    a. 01/2013=> post cardiac cath CVA to L post communicating artery system; R sided weakness   Syncope    Past Surgical History:  Procedure Laterality Date   COLONOSCOPY     CORONARY ANGIOPLASTY WITH STENT PLACEMENT  07/14/2002   Stent to the right coronary artery   ENDOLYMPHATIC SHUNT DECOMPRESSION  06/11/2009    Right endolymphatic sac decompression and shunt  placement   HERNIA REPAIR  04/10/2008   scrotal hernia repair   LEFT HEART CATHETERIZATION WITH CORONARY ANGIOGRAM N/A 01/30/2013   Procedure: LEFT HEART CATHETERIZATION WITH CORONARY ANGIOGRAM;  Surgeon: Larey Dresser, MD;  Location: [facility] CATH LAB;  Service: Cardiovascular;  Laterality: N/A;   ROTATOR CUFF REPAIR     for chronic rotator cuff injury   Current Outpatient Medications on File Prior to Visit  Medication Sig Dispense Refill   acetaminophen (TYLENOL) 325 MG tablet Take 1-2 tablets (325-650 mg total) by mouth every 4 (four) hours as needed for mild pain.     albuterol (PROVENTIL HFA;VENTOLIN HFA) 108 (90 Base) MCG/ACT inhaler Inhale 2 puffs into the lungs every 4 (four) hours as needed for wheezing or shortness of breath. 1 Inhaler 0   atorvastatin (LIPITOR) 80 MG tablet Take 1 tablet (80 mg total) by mouth daily at 6 PM. 90 tablet 1   carbamazepine (CARBATROL) 300 MG 12 hr capsule Take 1 capsule (300 mg total) by mouth 2 (two) times daily. 30 capsule 0   cloNIDine (CATAPRES) 0.2 MG tablet Take 1 tablet (0.2 mg total) by mouth 2 (two) times daily. 60 tablet 1   escitalopram (LEXAPRO) 5 MG tablet TAKE 1 TABLET BY MOUTH EVERYDAY AT BEDTIME 90 tablet 1   furosemide (LASIX) 20 MG tablet Take 0.5 tablets (10 mg total) by mouth daily. 45 tablet 3   glucose blood test strip 1 each by Other route as needed. Use as instructed 100 each 3   glucose monitoring kit (FREESTYLE) monitoring kit 1 each by Does not apply route as needed. 1 each 0   isosorbide  mononitrate (IMDUR) 60 MG 24 hr tablet Take 1 tablet (60 mg total) by mouth daily. 90 tablet 3   Lancets (ACCU-CHEK SAFE-T PRO) lancets 1 each by Other route as needed. Use as instructed 100 each 3   levETIRAcetam (KEPPRA) 250 MG tablet TAKE 1 TABLET BY MOUTH TWICE A DAY 180 tablet 1   levothyroxine (SYNTHROID, LEVOTHROID) 75 MCG tablet TAKE 1 TABLET BY MOUTH EVERY DAY (Patient taking differently: Take 75 mcg by mouth daily. ) 90 tablet 3  losartan (COZAAR) 100 MG tablet Take 1 tablet (100 mg total) by mouth daily. 90 tablet 3   metFORMIN (GLUCOPHAGE) 500 MG tablet TAKE 1/2 TABLET (250 MG) BY MOUTH 2 TIMES DAILY WITH A MEAL. 30 tablet 5   Multiple Vitamins-Minerals (MULTIVITAMINS THER. W/MINERALS) TABS Take 1 tablet by mouth at bedtime.       warfarin (COUMADIN) 7.5 MG tablet TAKE 1&1/2 TABS TUESDAY, THURSDAY & SATURDAY*1 TAB ON SUNDAY, MONDAY, WEDNESDAY & FRIDAY WITH SUPPER (Patient taking differently: TAKE 1&1/2 TABS TUESDAY, THURSDAY and 1 TAB ON [place], WEDNESDAY & Friday, Sat WITH SUPPER) 240 tablet 1   No current facility-administered medications on file prior to visit.    Allergies  Allergen Reactions   Penicillins     Unknown reaction   Social History   Socioeconomic History   Marital status: Married    Spouse name: Not on file   Number of children: 4   Years of education: 12th   Highest education level: Not on file  Occupational History   Occupation: Retired  Scientist, product/process development strain: Not on file   Food insecurity    Worry: Not on file    Inability: Not on Lexicographer needs    Medical: Not on file    Non-medical: Not on file  Tobacco Use   Smoking status: Former Smoker    Years: 5.00    Types: Cigarettes    Quit date: 08/19/1956    Years since quitting: 62.3   Smokeless tobacco: Former Systems developer    Types: Chew  Substance and Sexual Activity   Alcohol use: No   Drug use: No   Sexual activity: Never  Lifestyle    Physical activity    Days per week: Not on file    Minutes per session: Not on file   Stress: Not on file  Relationships   Social connections    Talks on phone: Not on file    Gets together: Not on file    Attends religious service: Not on file    Active member of club or organization: Not on file    Attends meetings of clubs or organizations: Not on file    Relationship status: Not on file   Intimate partner violence    Fear of current or ex partner: Not on file    Emotionally abused: Not on file    Physically abused: Not on file    Forced sexual activity: Not on file  Other Topics Concern   Not on file  Social History Narrative   Lives with wife.      Review of Systems  All other systems reviewed and are negative.      Objective:   Physical Exam Vitals signs reviewed.  Constitutional:      Appearance: Normal appearance. He is normal weight.  Cardiovascular:     Rate and Rhythm: Normal rate and regular rhythm.     Heart sounds: Normal heart sounds.  Pulmonary:     Effort: Pulmonary effort is normal. No respiratory distress.     Breath sounds: Normal breath sounds. No stridor. No wheezing, rhonchi or rales.  Musculoskeletal:       Arms:  Neurological:     Mental Status: He is alert.           Assessment & Plan:  Needs flu shot - Plan: Flu Vaccine QUAD High Dose(Fluad)  Paroxysmal atrial fibrillation ([place]) - Plan: PT with INR/Fingerstick  Skin lesion of left  arm  INR is therapeutic.  Make no change in Coumadin dose and reassess INR in 6 weeks.  I believe the lesion near his wrist is a squamous cell carcinoma.  I treated that with liquid nitrogen cryotherapy for a total of 30 seconds.  The lesion further up his forearm appears to be an actinic keratosis.  I treated that with 30 seconds of liquid nitrogen cryotherapy as well.  If the lesions persist I have recommended a dermatology consultation for excision

## 2018-12-19 ENCOUNTER — Other Ambulatory Visit: Payer: Self-pay | Admitting: Family Medicine

## 2018-12-25 NOTE — Progress Notes (Signed)
Cardiology Office Note   Date:  12/26/2018   ID:  [name], DOB 05-20-1933, MRN [id number]  PCP:  [name], MD  Cardiologist:   Minus Breeding, MD   Chief Complaint  Patient presents with  . Shortness of Breath      History of Present Illness: [name] is a 85 y.o. male who presents for follow up of CAD and CVA following a cath in 2014.  He has also had atrial flutter.  He was admitted with syncope.  He had hydrocephalus.  He had readmission in May again with syncope.  The etiology was not clear.  He did have atrial fib with short pauses and bradycardia and was taken off of beta blockers.  He has also been treated for diastolic HF.     Since I last saw him he has lost about 20 pounds.  Is been watching his diet.  He is not had any further syncope.  He does not feel any tachypalpitations.  He has had no presyncope.  He has no chest pressure, neck or arm discomfort.  He has no shortness of breath, PND or orthopnea.  He is off balance.  However, he does not think that this is worse.  He is careful walking with a walker if he goes out somewhere.   Past Medical History:  Diagnosis Date  . Atrial flutter ([place])   . BPH (benign prostatic hyperplasia)   . Coronary artery disease    a. s/p MI tx with Cypher DES to pRCA in 4/04;  b. Echocardiogram 7/09: Normal LV function.  c. Nuclear study 3/13 no ischemia;  d. ETT 2/14 neg;  e. admx with CP => LHC (01/30/2013):  pLAD 40-50%, oD1 70-80%, oOM1 40%, pRCA stent patent, mid RCA 30%, EF 60-65%. => med Rx.  . DDD (degenerative disc disease)   . Diabetes mellitus without complication ([place])   . Dyslipidemia   . Gait disorder 07/13/2018  . Hemorrhoids   . Hx MRSA infection    left buttocks abscess  . Hx of echocardiogram    a. Echocardiogram (01/31/2013): Mild focal basal and mild concentric hypertrophy of the septum, EF 50-55%, normal wall motion, grade 1 diastolic dysfunction, trivial AI, MAC, mild LAE, PASP 35  .  Hyperlipidemia   . Hypertension   . Hypothyroidism   . Meniere's disease    Status post shunt  . Myocardial infarction ([place]) 2008  . Occlusion and stenosis of carotid artery without mention of cerebral infarction    40-59% on carotid doppler 2014; [place] (01/2013): R 1-39%, L 60-79%  . Rotator cuff injury    chronic rotator cuff injury status post repair  . Seizure disorder ([place])   . Stroke [facility])    a. 01/2013=> post cardiac cath CVA to L post communicating artery system; R sided weakness  . Syncope     Past Surgical History:  Procedure Laterality Date  . COLONOSCOPY    . CORONARY ANGIOPLASTY WITH STENT PLACEMENT  07/14/2002   Stent to the right coronary artery  . ENDOLYMPHATIC SHUNT DECOMPRESSION  06/11/2009    Right endolymphatic sac decompression and shunt  placement  . HERNIA REPAIR  04/10/2008   scrotal hernia repair  . LEFT HEART CATHETERIZATION WITH CORONARY ANGIOGRAM N/A 01/30/2013   Procedure: LEFT HEART CATHETERIZATION WITH CORONARY ANGIOGRAM;  Surgeon: Larey Dresser, MD;  Location: [facility] LAB;  Service: Cardiovascular;  Laterality: N/A;  . ROTATOR CUFF REPAIR     for chronic  rotator cuff injury     Current Outpatient Medications  Medication Sig Dispense Refill  . acetaminophen (TYLENOL) 325 MG tablet Take 1-2 tablets (325-650 mg total) by mouth every 4 (four) hours as needed for mild pain.    Marland Kitchen albuterol (PROVENTIL HFA;VENTOLIN HFA) 108 (90 Base) MCG/ACT inhaler Inhale 2 puffs into the lungs every 4 (four) hours as needed for wheezing or shortness of breath. 1 Inhaler 0  . atorvastatin (LIPITOR) 80 MG tablet Take 1 tablet (80 mg total) by mouth daily at 6 PM. 90 tablet 1  . carbamazepine (CARBATROL) 300 MG 12 hr capsule TAKE 1 CAPSULE BY MOUTH TWICE A DAY 180 capsule 3  . cloNIDine (CATAPRES) 0.2 MG tablet Take 1 tablet (0.2 mg total) by mouth 2 (two) times daily. 60 tablet 1  . escitalopram (LEXAPRO) 5 MG tablet TAKE 1 TABLET BY MOUTH EVERYDAY AT BEDTIME 90 tablet 1   . furosemide (LASIX) 20 MG tablet Take 0.5 tablets (10 mg total) by mouth daily. 45 tablet 3  . glucose blood test strip 1 each by Other route as needed. Use as instructed 100 each 3  . glucose monitoring kit (FREESTYLE) monitoring kit 1 each by Does not apply route as needed. 1 each 0  . isosorbide mononitrate (IMDUR) 60 MG 24 hr tablet Take 1 tablet (60 mg total) by mouth daily. 90 tablet 3  . Lancets (ACCU-CHEK SAFE-T PRO) lancets 1 each by Other route as needed. Use as instructed 100 each 3  . levETIRAcetam (KEPPRA) 250 MG tablet TAKE 1 TABLET BY MOUTH TWICE A DAY 180 tablet 1  . levothyroxine (SYNTHROID, LEVOTHROID) 75 MCG tablet TAKE 1 TABLET BY MOUTH EVERY DAY (Patient taking differently: Take 75 mcg by mouth daily. ) 90 tablet 3  . losartan (COZAAR) 100 MG tablet Take 1 tablet (100 mg total) by mouth daily. 90 tablet 3  . metFORMIN (GLUCOPHAGE) 500 MG tablet TAKE 1/2 TABLET (250 MG) BY MOUTH 2 TIMES DAILY WITH A MEAL. 30 tablet 5  . Multiple Vitamins-Minerals (MULTIVITAMINS THER. W/MINERALS) TABS Take 1 tablet by mouth at bedtime.      Marland Kitchen warfarin (COUMADIN) 7.5 MG tablet TAKE 1&1/2 TABS TUESDAY, THURSDAY & SATURDAY*1 TAB ON SUNDAY, MONDAY, WEDNESDAY & FRIDAY WITH SUPPER (Patient taking differently: TAKE 1&1/2 TABS TUESDAY, THURSDAY and 1 TAB ON Aldona Lento, [facility] & Friday, Sat WITH SUPPER) 240 tablet 1   No current facility-administered medications for this visit.     Allergies:   Penicillins     ROS:  Please see the history of present illness.   Otherwise, review of systems are positive for none.   All other systems are reviewed and negative.    PHYSICAL EXAM: VS:  BP (!) 162/72   Pulse 72   Temp (!) 97 F (36.1 C)   Ht _0  (1.651 m)   Wt 192 lb 12.8 oz (87.5 kg)   BMI 32.08 kg/m  , BMI Body mass index is 32.08 kg/m. GENERAL:  Well appearing NECK:  No jugular venous distention, waveform within normal limits, carotid upstroke brisk and symmetric, no bruits, no  thyromegaly LUNGS:  Clear to auscultation bilaterally CHEST:  Unremarkable HEART:  PMI not displaced or sustained,S1 and S2 within normal limits, no S3, no S4, no clicks, no rubs, 3 of 6 apical systolic murmur radiating slightly at the aortic outflow tract, no diastolic murmurs ABD:  Flat, positive bowel sounds normal in frequency in pitch, no bruits, no rebound, no guarding, no midline pulsatile mass,  no hepatomegaly, no splenomegaly EXT:  2 plus pulses throughout, no edema, no cyanosis no clubbing   EKG:  EKG is not ordered today. The ekg ordered 08/25/2018 demonstrates sinus rhythm, rate 62, left axis deviation, interventricular conduction delay, no acute ST-T wave changes.   Recent Labs: 06/01/2018: TSH 3.023 08/20/2018: B Natriuretic Peptide 315.0 08/21/2018: ALT 16; BUN 27; Creatinine, Ser 1.07; Hemoglobin 10.7; Magnesium 2.2; Platelets 244; Potassium 3.7; Sodium 137    Lipid Panel    Component Value Date/Time   CHOL 179 05/30/2018 0440   TRIG 131 05/30/2018 0440   HDL 50 05/30/2018 0440   CHOLHDL 3.6 05/30/2018 0440   VLDL 26 05/30/2018 0440   LDLCALC 103 (H) 05/30/2018 0440      Wt Readings from Last 3 Encounters:  12/26/18 192 lb 12.8 oz (87.5 kg)  12/12/18 193 lb (87.5 kg)  11/07/18 196 lb (88.9 kg)      Other studies Reviewed: Additional studies/ records that were reviewed today include: Hospital records. Review of the above records demonstrates:  Please see elsewhere in the note.     ASSESSMENT AND PLAN:  Atrial flutter with bradycardia:   He has had bradycardia arrhythmia and had his metoprolol discontinued but he is not having any further problems.  He tolerates anticoagulation.  He is on warfarin because of an interaction with his drugs.  No other change in therapy is indicated.  Hypertension:   Blood pressure is elevated today but this is unusual.  He will otherwise continue the meds as listed.   Chronic diastolic heart failure:   His weight is down.  He is  weighing himself every day.  He is watching his salt.  I think he is euvolemic.  No change in therapy.  CAD:   He is having no chest pain.  No change in therapy.  Hypothyroidism: On Synthroid.  This is followed by [name], MD.  His TSH in June was 1.07.  Hyperlipidemia:    LDL was slightly elevated at 103.  HDL is 50.  I think he can remain on meds as listed.  Bilateral carotid artery disease: Recent carotid Doppler obtained on 05/30/2018 showed 1 to 39% disease bilaterally.  No change in therapy.  I reviewed this with him.  NPH: I sent a message during this visit to [place].  I reviewed the note there has been some discussion of sending him to [facility] but since he was getting better he was decided not to make that referral.  I discussed this with the patient as he was wondering about the plan.   Current medicines are reviewed at length with the patient today.  The patient does not have concerns regarding medicines.  The following changes have been made:  no change  Labs/ tests ordered today include:  No orders of the defined types were placed in this encounter.    Disposition:   FU with me in one year.     Signed, Minus Breeding, MD  12/26/2018 4:02 PM    [facility]

## 2018-12-26 ENCOUNTER — Other Ambulatory Visit: Payer: Self-pay

## 2018-12-26 ENCOUNTER — Ambulatory Visit (INDEPENDENT_AMBULATORY_CARE_PROVIDER_SITE_OTHER): Payer: Medicare Other | Admitting: Cardiology

## 2018-12-26 ENCOUNTER — Encounter: Payer: Self-pay | Admitting: Cardiology

## 2018-12-26 VITALS — BP 162/72 | HR 72 | Temp 97.0°F | Ht 65.0 in | Wt 192.8 lb

## 2018-12-26 DIAGNOSIS — E785 Hyperlipidemia, unspecified: Secondary | ICD-10-CM | POA: Diagnosis not present

## 2018-12-26 DIAGNOSIS — I10 Essential (primary) hypertension: Secondary | ICD-10-CM | POA: Diagnosis not present

## 2018-12-26 DIAGNOSIS — I2510 Atherosclerotic heart disease of native coronary artery without angina pectoris: Secondary | ICD-10-CM | POA: Diagnosis not present

## 2018-12-26 DIAGNOSIS — I5032 Chronic diastolic (congestive) heart failure: Secondary | ICD-10-CM

## 2018-12-26 DIAGNOSIS — I4892 Unspecified atrial flutter: Secondary | ICD-10-CM

## 2018-12-26 DIAGNOSIS — E039 Hypothyroidism, unspecified: Secondary | ICD-10-CM | POA: Diagnosis not present

## 2018-12-26 DIAGNOSIS — I6523 Occlusion and stenosis of bilateral carotid arteries: Secondary | ICD-10-CM

## 2018-12-26 NOTE — Patient Instructions (Signed)
Medication Instructions:  Your physician recommends that you continue on your current medications as directed. Please refer to the Current Medication list given to you today.  If you need a refill on your cardiac medications before your next appointment, please call your pharmacy.   Lab work: NONE  Testing/Procedures: NONE  Follow-Up: At [facility], you and your health needs are our priority.  As part of our continuing mission to provide you with exceptional heart care, we have created designated Provider Care Teams.  These Care Teams include your primary Cardiologist (physician) and Advanced Practice Providers (APPs -  Physician Assistants and Nurse Practitioners) who all work together to provide you with the care you need, when you need it. You will need a follow up appointment in 12 months.  Please call our office 2 months in advance to schedule this appointment.  You may see James Hochrein, MD or one of the following Advanced Practice Providers on your designated Care Team:   Rhonda Barrett, PA-C Kathryn Lawrence, DNP, ANP       

## 2019-01-05 ENCOUNTER — Other Ambulatory Visit: Payer: Self-pay | Admitting: Physical Medicine & Rehabilitation

## 2019-01-31 ENCOUNTER — Other Ambulatory Visit: Payer: Self-pay | Admitting: Family Medicine

## 2019-02-20 ENCOUNTER — Other Ambulatory Visit: Payer: Self-pay | Admitting: Family Medicine

## 2019-03-01 ENCOUNTER — Ambulatory Visit: Payer: Medicare Other | Admitting: Neurology

## 2019-03-20 ENCOUNTER — Other Ambulatory Visit: Payer: Self-pay

## 2019-03-20 ENCOUNTER — Ambulatory Visit (INDEPENDENT_AMBULATORY_CARE_PROVIDER_SITE_OTHER): Payer: Medicare Other | Admitting: Family Medicine

## 2019-03-20 VITALS — BP 156/84 | HR 50 | Temp 97.7°F | Resp 18 | Ht 65.0 in | Wt 187.0 lb

## 2019-03-20 DIAGNOSIS — I480 Paroxysmal atrial fibrillation: Secondary | ICD-10-CM | POA: Diagnosis not present

## 2019-03-20 DIAGNOSIS — Z7901 Long term (current) use of anticoagulants: Secondary | ICD-10-CM

## 2019-03-20 DIAGNOSIS — I10 Essential (primary) hypertension: Secondary | ICD-10-CM

## 2019-03-20 DIAGNOSIS — I6523 Occlusion and stenosis of bilateral carotid arteries: Secondary | ICD-10-CM | POA: Diagnosis not present

## 2019-03-20 DIAGNOSIS — E118 Type 2 diabetes mellitus with unspecified complications: Secondary | ICD-10-CM

## 2019-03-20 DIAGNOSIS — R269 Unspecified abnormalities of gait and mobility: Secondary | ICD-10-CM

## 2019-03-20 DIAGNOSIS — E039 Hypothyroidism, unspecified: Secondary | ICD-10-CM

## 2019-03-20 LAB — PT WITH INR/FINGERSTICK
INR, fingerstick: 3.1 ratio — ABNORMAL HIGH
PT, fingerstick: 37.1 s — ABNORMAL HIGH (ref 10.5–13.1)

## 2019-03-20 NOTE — Progress Notes (Signed)
Subjective:    Patient ID: [name], male    DOB: 1933/04/20, 85 y.o.   MRN: [id number]  HPI   Patient is here today to recheck his Coumadin.  He is currently taking 1-1/2 tablets on Tuesday, Thursday, and Saturday.  He is taking 1 tablet on all other days.  His INR today in the office is 3.1.  Patient denies any bleeding or bruising.  He denies any change in his diet or medications.  I reviewed his medication list with him.  He states that his daughter arranges his pills for him.  His blood pressure today is elevated at 156/84.  Patient has mild short-term memory loss.  He is uncertain if he has taken his medication yet today and what medication he has taken.  He denies any chest pain shortness of breath or dyspnea on exertion.  He denies any polyuria, polydipsia, or blurry vision.  He denies any diarrhea on Metformin.  He denies any neuropathy in his feet.  Diabetic foot exam was performed today and is normal.  He has normal sensation to 10 g monofilament bilaterally and 2/4 dorsalis pedis and posterior tibialis pulses bilaterally.  He is due to recheck his TSH for his hypothyroidism for which she takes levothyroxine.  He denies any recent weight change.  He denies any heat or cold intolerance.   Past Medical History:  Diagnosis Date  . Atrial flutter ([place])   . BPH (benign prostatic hyperplasia)   . Coronary artery disease    a. s/p MI tx with Cypher DES to pRCA in 4/04;  b. Echocardiogram 7/09: Normal LV function.  c. Nuclear study 3/13 no ischemia;  d. ETT 2/14 neg;  e. admx with CP => LHC (01/30/2013):  pLAD 40-50%, oD1 70-80%, oOM1 40%, pRCA stent patent, mid RCA 30%, EF 60-65%. => med Rx.  . DDD (degenerative disc disease)   . Diabetes mellitus without complication ([place])   . Dyslipidemia   . Gait disorder 07/13/2018  . Hemorrhoids   . Hx MRSA infection    left buttocks abscess  . Hx of echocardiogram    a. Echocardiogram (01/31/2013): Mild focal basal and mild concentric  hypertrophy of the septum, EF 50-55%, normal wall motion, grade 1 diastolic dysfunction, trivial AI, MAC, mild LAE, PASP 35  . Hyperlipidemia   . Hypertension   . Hypothyroidism   . Meniere's disease    Status post shunt  . Myocardial infarction ([place]) 2008  . Occlusion and stenosis of carotid artery without mention of cerebral infarction    40-59% on carotid doppler 2014; [place] (01/2013): R 1-39%, L 60-79%  . Rotator cuff injury    chronic rotator cuff injury status post repair  . Seizure disorder ([place])   . Stroke [facility])    a. 01/2013=> post cardiac cath CVA to L post communicating artery system; R sided weakness  . Syncope    Past Surgical History:  Procedure Laterality Date  . COLONOSCOPY    . CORONARY ANGIOPLASTY WITH STENT PLACEMENT  07/14/2002   Stent to the right coronary artery  . ENDOLYMPHATIC SHUNT DECOMPRESSION  06/11/2009    Right endolymphatic sac decompression and shunt  placement  . HERNIA REPAIR  04/10/2008   scrotal hernia repair  . LEFT HEART CATHETERIZATION WITH CORONARY ANGIOGRAM N/A 01/30/2013   Procedure: LEFT HEART CATHETERIZATION WITH CORONARY ANGIOGRAM;  Surgeon: Larey Dresser, MD;  Location: [facility] CATH LAB;  Service: Cardiovascular;  Laterality: N/A;  . ROTATOR CUFF REPAIR  for chronic rotator cuff injury   Current Outpatient Medications on File Prior to Visit  Medication Sig Dispense Refill  . acetaminophen (TYLENOL) 325 MG tablet Take 1-2 tablets (325-650 mg total) by mouth every 4 (four) hours as needed for mild pain.    Marland Kitchen albuterol (PROVENTIL HFA;VENTOLIN HFA) 108 (90 Base) MCG/ACT inhaler Inhale 2 puffs into the lungs every 4 (four) hours as needed for wheezing or shortness of breath. 1 Inhaler 0  . atorvastatin (LIPITOR) 80 MG tablet TAKE 1 TABLET (80 MG TOTAL) BY MOUTH DAILY AT 6 PM. 90 tablet 1  . carbamazepine (CARBATROL) 300 MG 12 hr capsule TAKE 1 CAPSULE BY MOUTH TWICE A DAY 180 capsule 3  . cloNIDine (CATAPRES) 0.2 MG tablet TAKE 1 TABLET BY  MOUTH 2 TIMES DAILY 180 tablet 3  . escitalopram (LEXAPRO) 5 MG tablet TAKE 1 TABLET BY MOUTH EVERYDAY AT BEDTIME 90 tablet 1  . furosemide (LASIX) 20 MG tablet Take 0.5 tablets (10 mg total) by mouth daily. 45 tablet 3  . glucose blood test strip 1 each by Other route as needed. Use as instructed 100 each 3  . glucose monitoring kit (FREESTYLE) monitoring kit 1 each by Does not apply route as needed. 1 each 0  . isosorbide mononitrate (IMDUR) 60 MG 24 hr tablet Take 1 tablet (60 mg total) by mouth daily. 90 tablet 3  . Lancets (ACCU-CHEK SAFE-T PRO) lancets 1 each by Other route as needed. Use as instructed 100 each 3  . levETIRAcetam (KEPPRA) 250 MG tablet TAKE 1 TABLET BY MOUTH TWICE A DAY 180 tablet 1  . levothyroxine (SYNTHROID, LEVOTHROID) 75 MCG tablet TAKE 1 TABLET BY MOUTH EVERY DAY (Patient taking differently: Take 75 mcg by mouth daily. ) 90 tablet 3  . losartan (COZAAR) 100 MG tablet Take 1 tablet (100 mg total) by mouth daily. 90 tablet 3  . metFORMIN (GLUCOPHAGE) 500 MG tablet TAKE 1/2 TABLET (250 MG) BY MOUTH 2 TIMES DAILY WITH A MEAL. 30 tablet 5  . Multiple Vitamins-Minerals (MULTIVITAMINS THER. W/MINERALS) TABS Take 1 tablet by mouth at bedtime.      Marland Kitchen warfarin (COUMADIN) 7.5 MG tablet TAKE 1&1/2 TABS TUESDAY, THURSDAY & SATURDAY*1 TAB ON SUNDAY, MONDAY, WEDNESDAY & FRIDAY WITH SUPPER (Patient taking differently: TAKE 1&1/2 TABS TUESDAY, THURSDAY and 1 TAB ON Aldona Lento, WEDNESDAY & Friday, Sat WITH SUPPER) 240 tablet 1   No current facility-administered medications on file prior to visit.   Allergies  Allergen Reactions  . Penicillins     Unknown reaction   Social History   Socioeconomic History  . Marital status: Married    Spouse name: Not on file  . Number of children: 4  . Years of education: 12th  . Highest education level: Not on file  Occupational History  . Occupation: Retired  Tobacco Use  . Smoking status: Former Smoker    Years: 5.00    Types:  Cigarettes    Quit date: 08/19/1956    Years since quitting: 62.6  . Smokeless tobacco: Former Systems developer    Types: Chew  Substance and Sexual Activity  . Alcohol use: No  . Drug use: No  . Sexual activity: Never  Other Topics Concern  . Not on file  Social History Narrative   Lives with wife.   Social Determinants of Health   Financial Resource Strain:   . Difficulty of Paying Living Expenses: Not on file  Food Insecurity:   . Worried About Charity fundraiser  in the Last Year: Not on file  . Ran Out of Food in the Last Year: Not on file  Transportation Needs:   . Lack of Transportation (Medical): Not on file  . Lack of Transportation (Non-Medical): Not on file  Physical Activity:   . Days of Exercise per Week: Not on file  . Minutes of Exercise per Session: Not on file  Stress:   . Feeling of Stress : Not on file  Social Connections:   . Frequency of Communication with Friends and Family: Not on file  . Frequency of Social Gatherings with Friends and Family: Not on file  . Attends Religious Services: Not on file  . Active Member of Clubs or Organizations: Not on file  . Attends Archivist Meetings: Not on file  . Marital Status: Not on file  Intimate Partner Violence:   . Fear of Current or Ex-Partner: Not on file  . Emotionally Abused: Not on file  . Physically Abused: Not on file  . Sexually Abused: Not on file      Review of Systems  All other systems reviewed and are negative.      Objective:   Physical Exam Vitals reviewed.  Constitutional:      General: He is not in acute distress.    Appearance: Normal appearance. He is obese. He is not ill-appearing, toxic-appearing or diaphoretic.  Neck:     Vascular: No carotid bruit.  Cardiovascular:     Rate and Rhythm: Normal rate and regular rhythm.     Heart sounds: Normal heart sounds. No murmur.  Pulmonary:     Effort: Pulmonary effort is normal. No respiratory distress.     Breath sounds: Normal  breath sounds. No stridor. No wheezing, rhonchi or rales.  Abdominal:     General: Bowel sounds are normal. There is no distension.     Palpations: Abdomen is soft.     Tenderness: There is no abdominal tenderness. There is no guarding or rebound.     Hernia: No hernia is present.  Musculoskeletal:     Cervical back: Neck supple.     Right lower leg: No edema.     Left lower leg: No edema.  Neurological:     General: No focal deficit present.     Mental Status: He is alert and oriented to person, place, and time. Mental status is at baseline.     Cranial Nerves: No cranial nerve deficit.     Motor: No weakness.  Psychiatric:        Mood and Affect: Mood normal.        Thought Content: Thought content normal.           Assessment & Plan:  Paroxysmal atrial fibrillation (HCC)  Controlled type 2 diabetes mellitus with complication, without long-term current use of insulin (HCC)  Gait disorder  Chronic anticoagulation - Plan: PT with INR/Fingerstick  Essential hypertension  Hypothyroidism, unspecified type - Plan: TSH  Patient today is in normal sinus rhythm based on his exam.  His INR is slightly supratherapeutic at 3.1 however he did take 10 mg of Coumadin this morning.  Therefore I will not make any changes as I believe on his alternating schedule he will likely drop well below 3.  Overall he is tolerating Coumadin well without any difficulties.  He continues to use a cane due to chronic balance issues however otherwise he is doing well with no concerns on his review of systems.  I will check  a hemoglobin A1c to monitor the management of his diabetes.  I will check a CMP along with a CBC.  I will check a TSH to ensure accurate dosing of his levothyroxine.  Blood pressure today is elevated.  Ideally I like to see his systolic blood pressure less than 140.  Next that would be to increase his clonidine to 0.2 mg twice daily.  However I am concerned that this could cause him to have  headaches and sedation.  Therefore I will leave his medications at their current doses for the present time and have the patient monitor his blood pressure more frequently

## 2019-03-21 LAB — COMPLETE METABOLIC PANEL WITH GFR
AG Ratio: 1.9 (calc) (ref 1.0–2.5)
ALT: 17 U/L (ref 9–46)
AST: 22 U/L (ref 10–35)
Albumin: 4.2 g/dL (ref 3.6–5.1)
Alkaline phosphatase (APISO): 69 U/L (ref 35–144)
BUN: 20 mg/dL (ref 7–25)
CO2: 25 mmol/L (ref 20–32)
Calcium: 9.2 mg/dL (ref 8.6–10.3)
Chloride: 102 mmol/L (ref 98–110)
Creat: 1.05 mg/dL (ref 0.70–1.11)
GFR, Est African American: 76 mL/min/{1.73_m2} (ref >=60)
GFR, Est Non African American: 65 mL/min/{1.73_m2} (ref >=60)
Globulin: 2.2 g/dL (calc) (ref 1.9–3.7)
Glucose, Bld: 83 mg/dL (ref 65–99)
Potassium: 5.2 mmol/L (ref 3.5–5.3)
Sodium: 139 mmol/L (ref 135–146)
Total Bilirubin: 0.4 mg/dL (ref 0.2–1.2)
Total Protein: 6.4 g/dL (ref 6.1–8.1)

## 2019-03-21 LAB — TSH: TSH: 6.43 mIU/L — ABNORMAL HIGH (ref 0.40–4.50)

## 2019-04-02 ENCOUNTER — Other Ambulatory Visit: Payer: Self-pay | Admitting: Neurology

## 2019-04-22 ENCOUNTER — Ambulatory Visit: Payer: Medicare Other

## 2019-04-27 ENCOUNTER — Ambulatory Visit: Payer: Medicare Other

## 2019-04-29 ENCOUNTER — Ambulatory Visit: Payer: Medicare Other | Attending: Internal Medicine

## 2019-04-29 DIAGNOSIS — Z23 Encounter for immunization: Secondary | ICD-10-CM | POA: Insufficient documentation

## 2019-04-29 NOTE — Progress Notes (Signed)
   Covid-19 Vaccination Clinic  Name:  [name]    MRN: [id number] DOB: Jun 07, 1933  04/29/2019  [name] was observed post Covid-19 immunization for 15 minutes without incidence. He was provided with Vaccine Information Sheet and instruction to access the V-Safe system.   [name] was instructed to call 911 with any severe reactions post vaccine: Marland Kitchen Difficulty breathing  . Swelling of your face and throat  . A fast heartbeat  . A bad rash all over your body  . Dizziness and weakness    Immunizations Administered    Name Date Dose VIS Date Route   Pfizer COVID-19 Vaccine 04/29/2019  8:16 AM 0.3 mL 03/02/2019 Intramuscular   Manufacturer: [place]   Lot: [id number]   [place]: [id number]

## 2019-05-03 ENCOUNTER — Other Ambulatory Visit: Payer: Self-pay

## 2019-05-03 ENCOUNTER — Other Ambulatory Visit: Payer: No Typology Code available for payment source

## 2019-05-03 ENCOUNTER — Telehealth: Payer: Self-pay | Admitting: Family Medicine

## 2019-05-03 DIAGNOSIS — I480 Paroxysmal atrial fibrillation: Secondary | ICD-10-CM

## 2019-05-03 DIAGNOSIS — E039 Hypothyroidism, unspecified: Secondary | ICD-10-CM

## 2019-05-03 DIAGNOSIS — I10 Essential (primary) hypertension: Secondary | ICD-10-CM

## 2019-05-03 LAB — PT WITH INR/FINGERSTICK
INR, fingerstick: 1.5 ratio — ABNORMAL HIGH
PT, fingerstick: 18.2 s — ABNORMAL HIGH (ref 10.5–13.1)

## 2019-05-03 NOTE — Telephone Encounter (Signed)
INR/PT  = 1.5/8.2  Per [name] - coumadin 1 tab m/f and 1.5 tabs all there days. Recheck in 2 weeks. Apt made.

## 2019-05-04 LAB — COMPLETE METABOLIC PANEL WITH GFR
AG Ratio: 2 (calc) (ref 1.0–2.5)
ALT: 19 U/L (ref 9–46)
AST: 20 U/L (ref 10–35)
Albumin: 4.2 g/dL (ref 3.6–5.1)
Alkaline phosphatase (APISO): 75 U/L (ref 35–144)
BUN: 17 mg/dL (ref 7–25)
CO2: 29 mmol/L (ref 20–32)
Calcium: 9.2 mg/dL (ref 8.6–10.3)
Chloride: 101 mmol/L (ref 98–110)
Creat: 1.02 mg/dL (ref 0.70–1.11)
GFR, Est African American: 78 mL/min/{1.73_m2} (ref >=60)
GFR, Est Non African American: 68 mL/min/{1.73_m2} (ref >=60)
Globulin: 2.1 g/dL (calc) (ref 1.9–3.7)
Glucose, Bld: 97 mg/dL (ref 65–99)
Potassium: 5 mmol/L (ref 3.5–5.3)
Sodium: 139 mmol/L (ref 135–146)
Total Bilirubin: 0.6 mg/dL (ref 0.2–1.2)
Total Protein: 6.3 g/dL (ref 6.1–8.1)

## 2019-05-04 LAB — CBC WITH DIFFERENTIAL/PLATELET
Absolute Monocytes: 785 cells/uL (ref 200–950)
Basophils Absolute: 62 cells/uL (ref 0–200)
Basophils Relative: 0.8 %
Eosinophils Absolute: 462 cells/uL (ref 15–500)
Eosinophils Relative: 6 %
HCT: 34.5 % — ABNORMAL LOW (ref 38.5–50.0)
Hemoglobin: 11.5 g/dL — ABNORMAL LOW (ref 13.2–17.1)
Lymphs Abs: 1871 cells/uL (ref 850–3900)
MCH: 30.4 pg (ref 27.0–33.0)
MCHC: 33.3 g/dL (ref 32.0–36.0)
MCV: 91.3 fL (ref 80.0–100.0)
MPV: 11.3 fL (ref 7.5–12.5)
Monocytes Relative: 10.2 %
Neutro Abs: 4520 cells/uL (ref 1500–7800)
Neutrophils Relative %: 58.7 %
Platelets: 261 10*3/uL (ref 140–400)
RBC: 3.78 10*6/uL — ABNORMAL LOW (ref 4.20–5.80)
RDW: 14 % (ref 11.0–15.0)
Total Lymphocyte: 24.3 %
WBC: 7.7 10*3/uL (ref 3.8–10.8)

## 2019-05-04 LAB — HEMOGLOBIN A1C
Hgb A1c MFr Bld: 6 % of total Hgb — ABNORMAL HIGH (ref <5.7)
Mean Plasma Glucose: 126 (calc)
eAG (mmol/L): 7 (calc)

## 2019-05-04 LAB — TSH: TSH: 4.07 mIU/L (ref 0.40–4.50)

## 2019-05-17 ENCOUNTER — Other Ambulatory Visit: Payer: Self-pay

## 2019-05-17 ENCOUNTER — Other Ambulatory Visit: Payer: Self-pay | Admitting: Family Medicine

## 2019-05-17 ENCOUNTER — Ambulatory Visit: Payer: No Typology Code available for payment source | Admitting: Family Medicine

## 2019-05-17 ENCOUNTER — Ambulatory Visit (INDEPENDENT_AMBULATORY_CARE_PROVIDER_SITE_OTHER): Payer: Medicare Other | Admitting: Family Medicine

## 2019-05-17 ENCOUNTER — Encounter: Payer: Self-pay | Admitting: Family Medicine

## 2019-05-17 DIAGNOSIS — I480 Paroxysmal atrial fibrillation: Secondary | ICD-10-CM

## 2019-05-17 LAB — PT WITH INR/FINGERSTICK
INR, fingerstick: 4.7 ratio — ABNORMAL HIGH
PT, fingerstick: 56.9 s — ABNORMAL HIGH (ref 10.5–13.1)

## 2019-05-17 NOTE — Progress Notes (Signed)
Subjective:    Patient ID: [name], male    DOB: 05-Nov-1933, 86 y.o.   MRN: [id number]  HPI  Patient is an 86 year old Caucasian male with a history of atrial fibrillation who comes in today for an INR check.  The patient was checked 2 weeks ago and was found to be subtherapeutic with an INR of 1.5.  At that time he was supposed to be taking 1-1/2 tablets 3 times a week and 1 tablet every other day.  Therefore we increased his Coumadin to 1-1/2 tablets every day except Monday and Friday at which point he would take 1 tablet.  His INR jumped dramatically to 4.7 leading me to believe that he was not taking his medication appropriately when he was subtherapeutic as the change in his INR is 4 more drastic than I would expect based on the change that we made in his medication essentially increasing 1 tablet/week.  Patient has dementia and is therefore uncertain of how he is taking his medication.  His daughter places the pills in the pillbox however he takes the pills.  He denies missing any doses however I believe more than likely he skipped the dose by accident. Past Medical History:  Diagnosis Date  . Atrial flutter ([place])   . BPH (benign prostatic hyperplasia)   . Coronary artery disease    a. s/p MI tx with Cypher DES to pRCA in 4/04;  b. Echocardiogram 7/09: Normal LV function.  c. Nuclear study 3/13 no ischemia;  d. ETT 2/14 neg;  e. admx with CP => LHC (01/30/2013):  pLAD 40-50%, oD1 70-80%, oOM1 40%, pRCA stent patent, mid RCA 30%, EF 60-65%. => med Rx.  . DDD (degenerative disc disease)   . Diabetes mellitus without complication ([place])   . Dyslipidemia   . Gait disorder 07/13/2018  . Hemorrhoids   . Hx MRSA infection    left buttocks abscess  . Hx of echocardiogram    a. Echocardiogram (01/31/2013): Mild focal basal and mild concentric hypertrophy of the septum, EF 50-55%, normal wall motion, grade 1 diastolic dysfunction, trivial AI, MAC, mild LAE, PASP 35  . Hyperlipidemia   .  Hypertension   . Hypothyroidism   . Meniere's disease    Status post shunt  . Myocardial infarction ([place]) 2008  . Occlusion and stenosis of carotid artery without mention of cerebral infarction    40-59% on carotid doppler 2014; [place] (01/2013): R 1-39%, L 60-79%  . Rotator cuff injury    chronic rotator cuff injury status post repair  . Seizure disorder ([place])   . Stroke [facility])    a. 01/2013=> post cardiac cath CVA to L post communicating artery system; R sided weakness  . Syncope    Past Surgical History:  Procedure Laterality Date  . COLONOSCOPY    . CORONARY ANGIOPLASTY WITH STENT PLACEMENT  07/14/2002   Stent to the right coronary artery  . ENDOLYMPHATIC SHUNT DECOMPRESSION  06/11/2009    Right endolymphatic sac decompression and shunt  placement  . HERNIA REPAIR  04/10/2008   scrotal hernia repair  . LEFT HEART CATHETERIZATION WITH CORONARY ANGIOGRAM N/A 01/30/2013   Procedure: LEFT HEART CATHETERIZATION WITH CORONARY ANGIOGRAM;  Surgeon: Larey Dresser, MD;  Location: [facility] CATH LAB;  Service: Cardiovascular;  Laterality: N/A;  . ROTATOR CUFF REPAIR     for chronic rotator cuff injury   Current Outpatient Medications on File Prior to Visit  Medication Sig Dispense Refill  . acetaminophen (TYLENOL) 325 MG  tablet Take 1-2 tablets (325-650 mg total) by mouth every 4 (four) hours as needed for mild pain.    Marland Kitchen albuterol (PROVENTIL HFA;VENTOLIN HFA) 108 (90 Base) MCG/ACT inhaler Inhale 2 puffs into the lungs every 4 (four) hours as needed for wheezing or shortness of breath. 1 Inhaler 0  . atorvastatin (LIPITOR) 80 MG tablet TAKE 1 TABLET (80 MG TOTAL) BY MOUTH DAILY AT 6 PM. 90 tablet 1  . carbamazepine (CARBATROL) 300 MG 12 hr capsule TAKE 1 CAPSULE BY MOUTH TWICE A DAY 180 capsule 3  . cloNIDine (CATAPRES) 0.2 MG tablet TAKE 1 TABLET BY MOUTH 2 TIMES DAILY 180 tablet 3  . escitalopram (LEXAPRO) 5 MG tablet TAKE 1 TABLET BY MOUTH EVERYDAY AT BEDTIME 90 tablet 1  . furosemide (LASIX)  20 MG tablet Take 0.5 tablets (10 mg total) by mouth daily. 45 tablet 3  . glucose blood test strip 1 each by Other route as needed. Use as instructed 100 each 3  . glucose monitoring kit (FREESTYLE) monitoring kit 1 each by Does not apply route as needed. 1 each 0  . isosorbide mononitrate (IMDUR) 60 MG 24 hr tablet Take 1 tablet (60 mg total) by mouth daily. 90 tablet 3  . Lancets (ACCU-CHEK SAFE-T PRO) lancets 1 each by Other route as needed. Use as instructed 100 each 3  . levETIRAcetam (KEPPRA) 250 MG tablet TAKE 1 TABLET BY MOUTH TWICE A DAY 180 tablet 0  . levothyroxine (SYNTHROID, LEVOTHROID) 75 MCG tablet TAKE 1 TABLET BY MOUTH EVERY DAY (Patient taking differently: Take 75 mcg by mouth daily. ) 90 tablet 3  . losartan (COZAAR) 100 MG tablet Take 1 tablet (100 mg total) by mouth daily. 90 tablet 3  . metFORMIN (GLUCOPHAGE) 500 MG tablet TAKE 1/2 TABLET (250 MG) BY MOUTH 2 TIMES DAILY WITH A MEAL. 30 tablet 5  . Multiple Vitamins-Minerals (MULTIVITAMINS THER. W/MINERALS) TABS Take 1 tablet by mouth at bedtime.      Marland Kitchen warfarin (COUMADIN) 7.5 MG tablet TAKE 1&1/2 TABS TUESDAY, THURSDAY & SATURDAY*1 TAB ON SUNDAY, MONDAY, WEDNESDAY & FRIDAY WITH SUPPER (Patient taking differently: TAKE 1&1/2 TABS TUESDAY, THURSDAY and 1 TAB ON Aldona Lento, WEDNESDAY & Friday, Sat WITH SUPPER) 240 tablet 1   No current facility-administered medications on file prior to visit.   Allergies  Allergen Reactions  . Penicillins     Unknown reaction   Social History   Socioeconomic History  . Marital status: Married    Spouse name: Not on file  . Number of children: 4  . Years of education: 12th  . Highest education level: Not on file  Occupational History  . Occupation: Retired  Tobacco Use  . Smoking status: Former Smoker    Years: 5.00    Types: Cigarettes    Quit date: 08/19/1956    Years since quitting: 62.7  . Smokeless tobacco: Former Systems developer    Types: Chew  Substance and Sexual Activity  .  Alcohol use: No  . Drug use: No  . Sexual activity: Never  Other Topics Concern  . Not on file  Social History Narrative   Lives with wife.   Social Determinants of Health   Financial Resource Strain:   . Difficulty of Paying Living Expenses: Not on file  Food Insecurity:   . Worried About Charity fundraiser in the Last Year: Not on file  . Ran Out of Food in the Last Year: Not on file  Transportation Needs:   .  Lack of Transportation (Medical): Not on file  . Lack of Transportation (Non-Medical): Not on file  Physical Activity:   . Days of Exercise per Week: Not on file  . Minutes of Exercise per Session: Not on file  Stress:   . Feeling of Stress : Not on file  Social Connections:   . Frequency of Communication with Friends and Family: Not on file  . Frequency of Social Gatherings with Friends and Family: Not on file  . Attends Religious Services: Not on file  . Active Member of Clubs or Organizations: Not on file  . Attends Archivist Meetings: Not on file  . Marital Status: Not on file  Intimate Partner Violence:   . Fear of Current or Ex-Partner: Not on file  . Emotionally Abused: Not on file  . Physically Abused: Not on file  . Sexually Abused: Not on file     Review of Systems  All other systems reviewed and are negative.      Objective:   Physical Exam Vitals reviewed.  Constitutional:      General: He is not in acute distress.    Appearance: Normal appearance. He is obese. He is not ill-appearing, toxic-appearing or diaphoretic.  Cardiovascular:     Rate and Rhythm: Normal rate. Rhythm irregular.  Pulmonary:     Effort: Pulmonary effort is normal. No respiratory distress.     Breath sounds: Normal breath sounds. No wheezing, rhonchi or rales.  Musculoskeletal:     Right lower leg: No edema.     Left lower leg: No edema.  Neurological:     Mental Status: He is alert.           Assessment & Plan:  Paroxysmal atrial fibrillation  ([place]) - Plan: PT with INR/Fingerstick  INR is supratherapeutic.  Hold Coumadin today and tomorrow.  This should drop his INR to approximately 2.  Then resume Coumadin however resume at 1 mg tablets every day except for Monday, Wednesday, Friday.  On Monday Wednesday Friday he is to take 1-1/2 tablets.  And recheck INR in 4 weeks

## 2019-05-23 ENCOUNTER — Ambulatory Visit: Payer: Medicare Other | Attending: Internal Medicine

## 2019-05-23 DIAGNOSIS — Z23 Encounter for immunization: Secondary | ICD-10-CM | POA: Insufficient documentation

## 2019-05-23 NOTE — Progress Notes (Signed)
   Covid-19 Vaccination Clinic  Name:  [name]    MRN: [id number] DOB: 04-15-1933  05/23/2019  [name] was observed post Covid-19 immunization for 15 minutes without incident. He was provided with Vaccine Information Sheet and instruction to access the V-Safe system.   [name] was instructed to call 911 with any severe reactions post vaccine: Marland Kitchen Difficulty breathing  . Swelling of face and throat  . A fast heartbeat  . A bad rash all over body  . Dizziness and weakness   Immunizations Administered    Name Date Dose VIS Date Route   Pfizer COVID-19 Vaccine 05/23/2019  3:37 PM 0.3 mL 03/02/2019 Intramuscular   Manufacturer: [place]   Lot: [id number]   [place]: [id number]

## 2019-06-09 ENCOUNTER — Other Ambulatory Visit: Payer: Self-pay | Admitting: Family Medicine

## 2019-06-12 ENCOUNTER — Other Ambulatory Visit: Payer: Self-pay | Admitting: Physician Assistant

## 2019-06-17 IMAGING — CR DG CHEST 2V
2 series · 2 of 2 positions shown · non-contrast
Comparison: PA and lateral chest x-ray April 13, 2015

CLINICAL DATA: Six weeks of nonproductive cough. History of
diabetes, nonsmoker trauma coronary artery disease with angioplasty,
CHF.

EXAM:
CHEST  2 VIEW

[w chest pa]
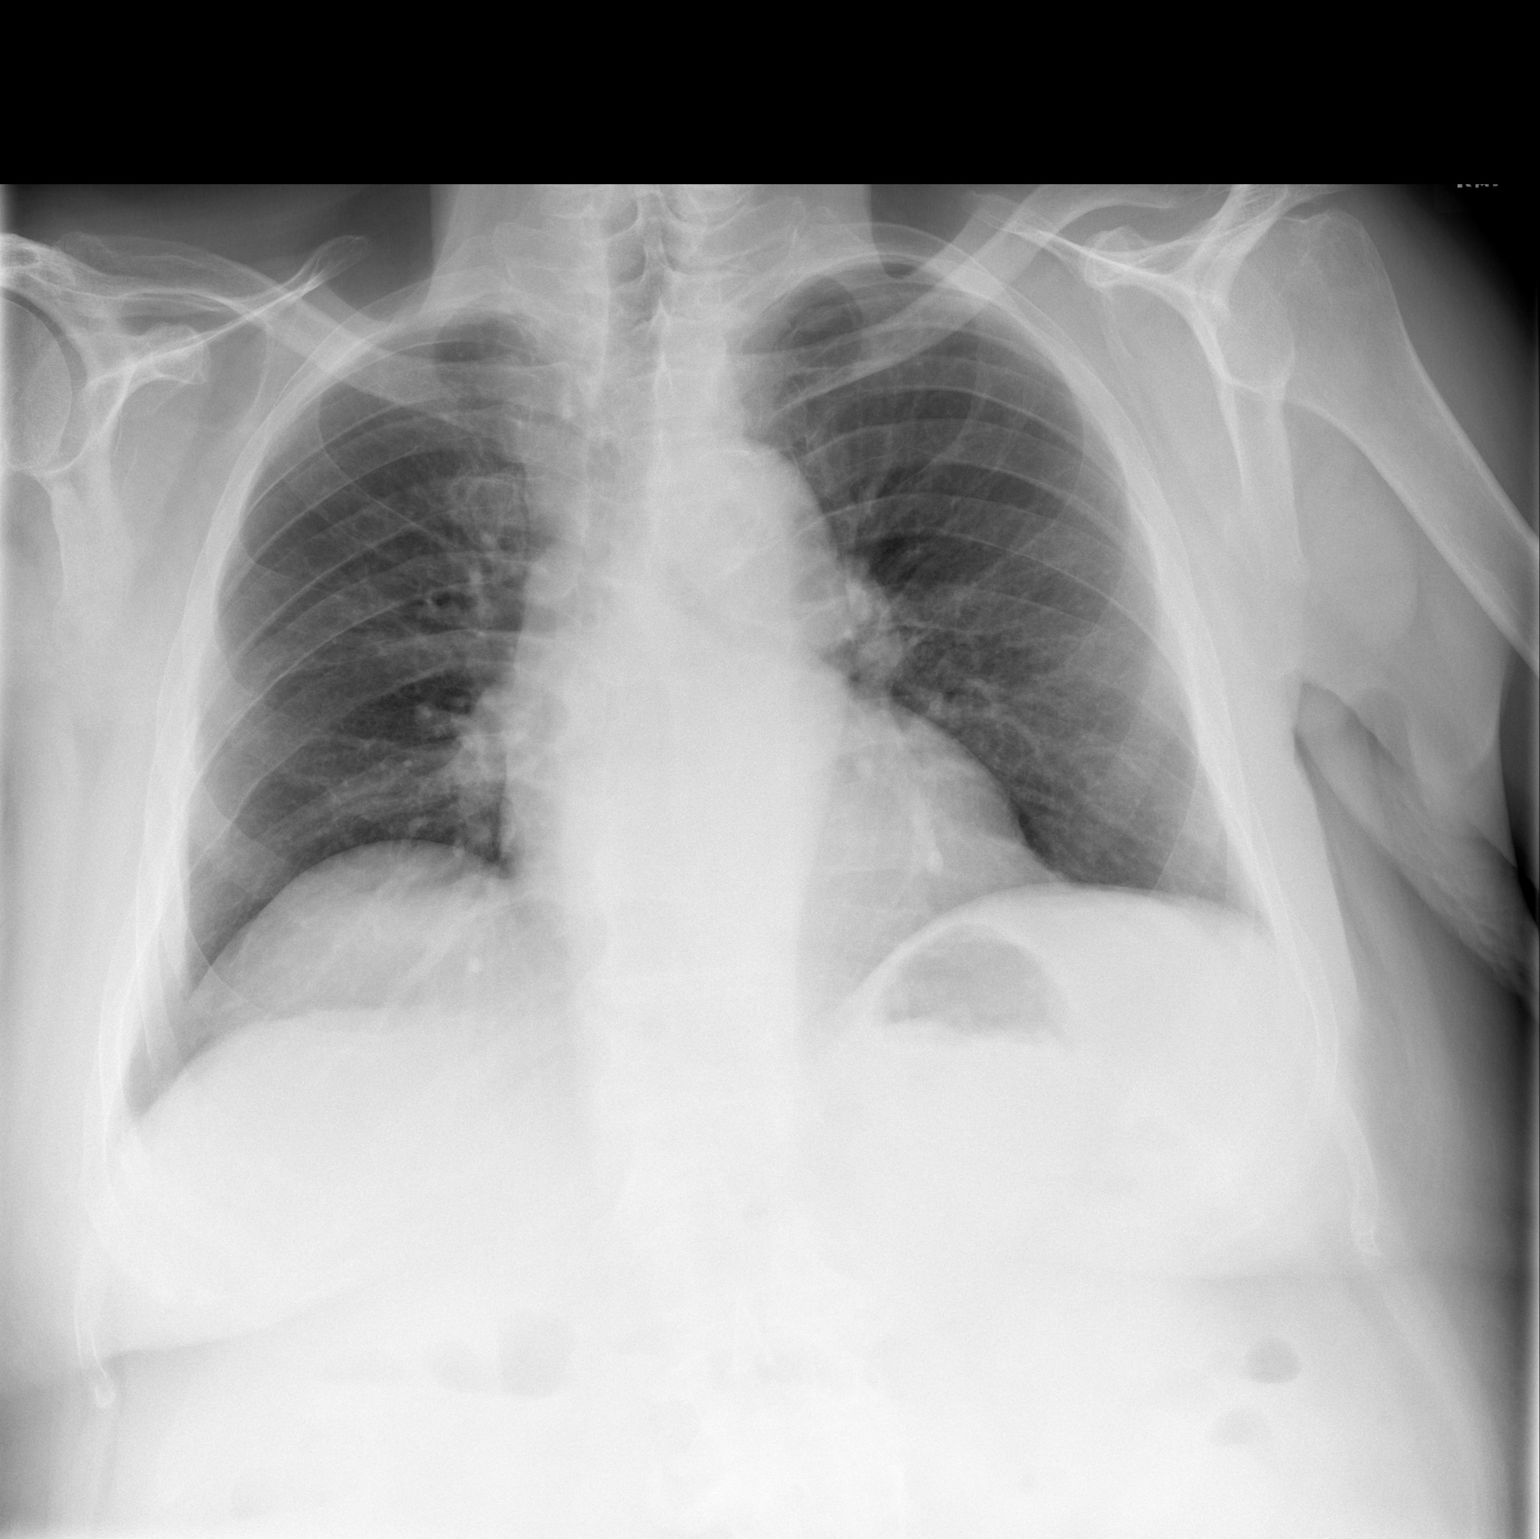

[w chest lat]
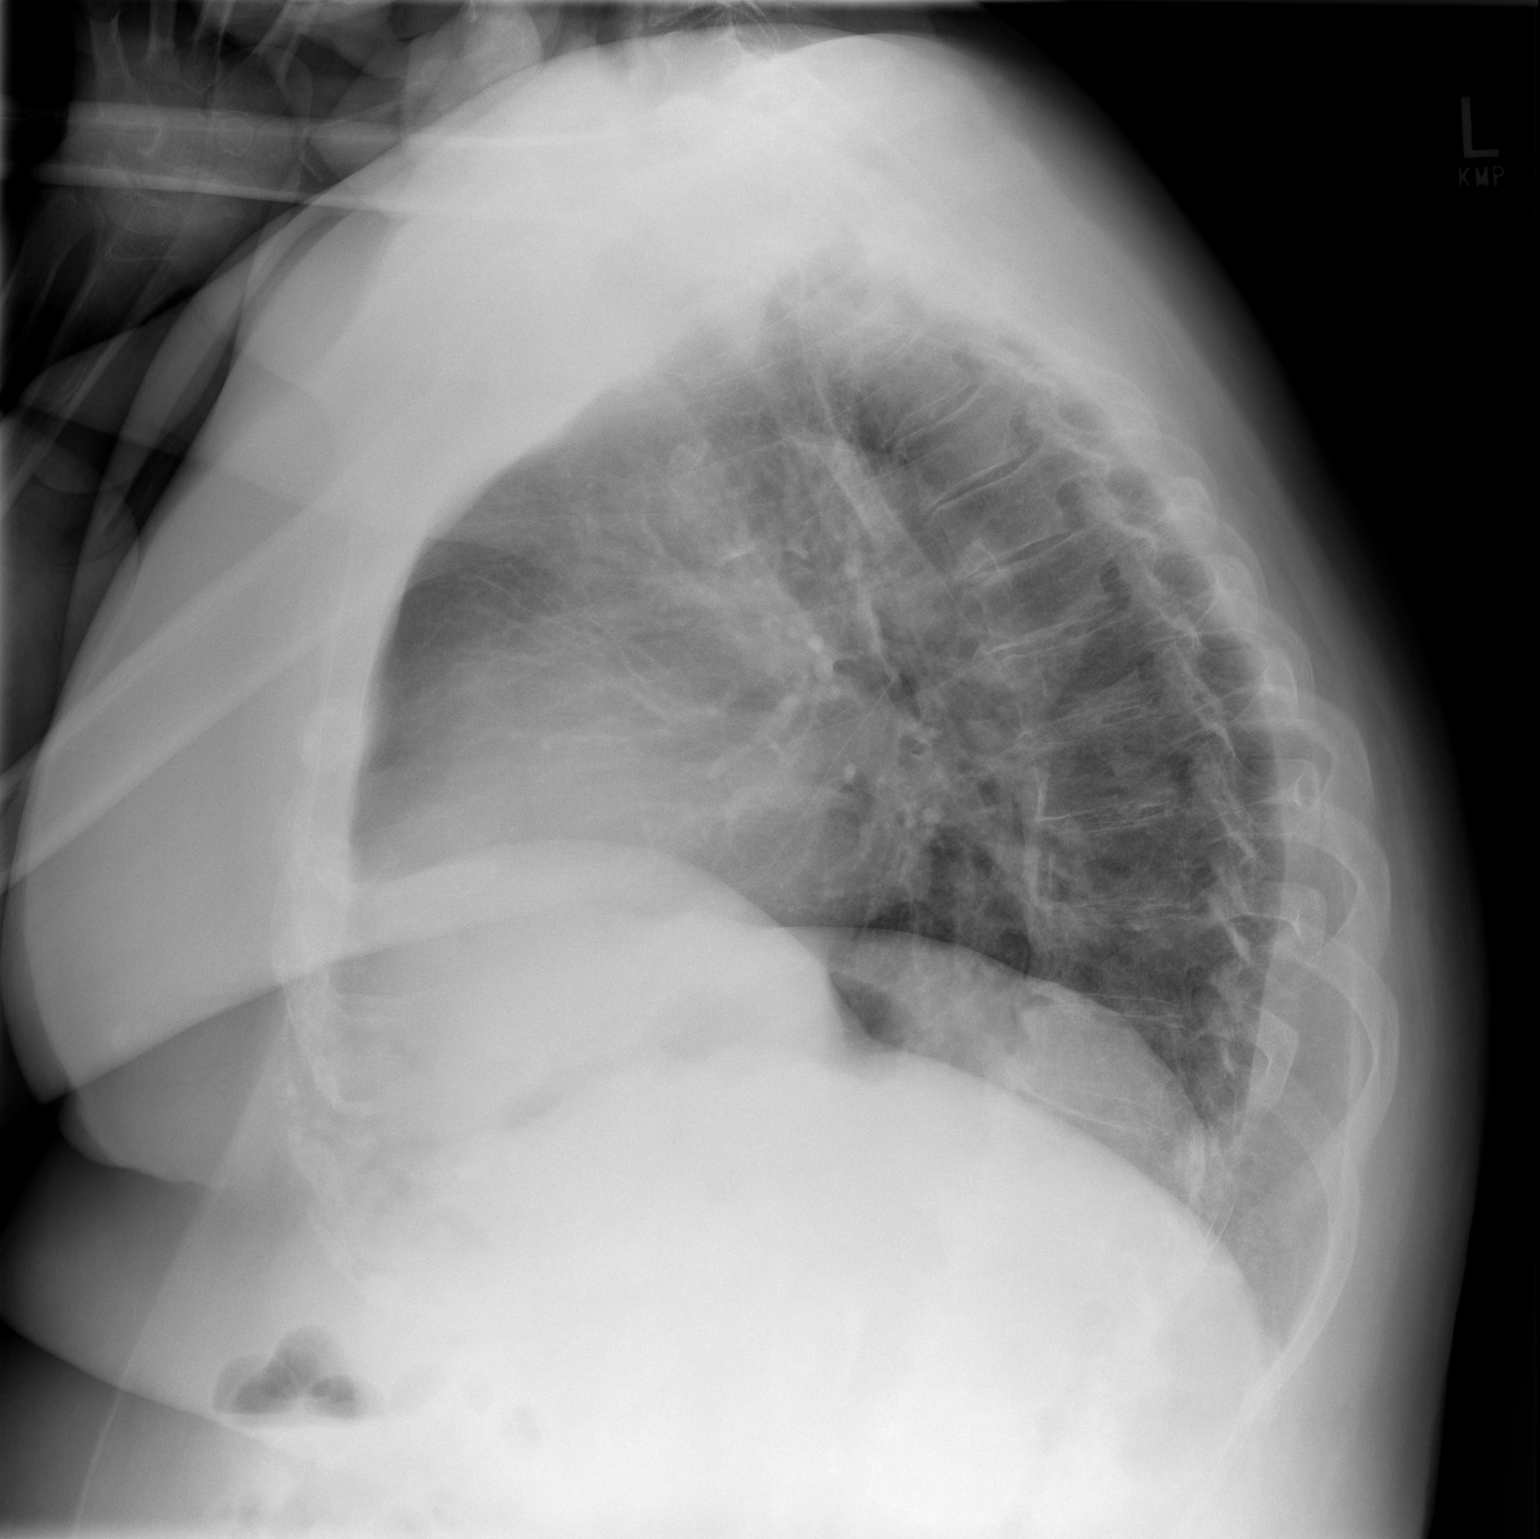

[2 of 2 positions shown; findings below may reference images not displayed]

FINDINGS: The lungs are reasonably well inflated. There is no focal
infiltrate. There is no pleural effusion the heart and pulmonary
vascularity are normal. There is calcification in the wall of the
aortic arch. The bony thorax exhibits no acute abnormality.
IMPRESSION: There is no pneumonia, CHF, nor other acute cardiopulmonary
abnormality.

Thoracic aortic atherosclerosis.

## 2019-07-07 ENCOUNTER — Other Ambulatory Visit: Payer: Self-pay | Admitting: Neurology

## 2019-07-09 ENCOUNTER — Other Ambulatory Visit: Payer: Self-pay

## 2019-07-09 MED ORDER — LEVETIRACETAM 250 MG PO TABS: 250.0000 mg | ORAL_TABLET | Freq: Two times a day (BID) | ORAL | 1 refills | Status: DC

## 2019-07-24 ENCOUNTER — Ambulatory Visit (INDEPENDENT_AMBULATORY_CARE_PROVIDER_SITE_OTHER): Payer: Medicare Other | Admitting: Family Medicine

## 2019-07-24 ENCOUNTER — Other Ambulatory Visit: Payer: Self-pay

## 2019-07-24 ENCOUNTER — Encounter: Payer: Self-pay | Admitting: Family Medicine

## 2019-07-24 VITALS — BP 128/60 | HR 68 | Temp 97.2°F | Resp 16 | Ht 65.5 in | Wt 180.0 lb

## 2019-07-24 DIAGNOSIS — I480 Paroxysmal atrial fibrillation: Secondary | ICD-10-CM | POA: Diagnosis not present

## 2019-07-24 LAB — PT WITH INR/FINGERSTICK
INR, fingerstick: 1.5 ratio — ABNORMAL HIGH
PT, fingerstick: 18.3 s — ABNORMAL HIGH (ref 10.5–13.1)

## 2019-07-24 NOTE — Progress Notes (Signed)
Subjective:    Patient ID: [name], male    DOB: 07/14/1933, 86 y.o.   MRN: [id number]  HPI 05/17/19 Patient is an 86 year old Caucasian male with a history of atrial fibrillation who comes in today for an INR check.  The patient was checked 2 weeks ago and was found to be subtherapeutic with an INR of 1.5.  At that time he was supposed to be taking 1-1/2 tablets 3 times a week and 1 tablet every other day.  Therefore we increased his Coumadin to 1-1/2 tablets every day except Monday and Friday at which point he would take 1 tablet.  His INR jumped dramatically to 4.7 leading me to believe that he was not taking his medication appropriately when he was subtherapeutic as the change in his INR is 4 more drastic than I would expect based on the change that we made in his medication essentially increasing 1 tablet/week.  Patient has dementia and is therefore uncertain of how he is taking his medication.  His daughter places the pills in the pillbox however he takes the pills.  He denies missing any doses however I believe more than likely he skipped the dose by accident.  At that time, my plan was: INR is supratherapeutic.  Hold Coumadin today and tomorrow.  This should drop his INR to approximately 2.  Then resume Coumadin however resume at 1 mg tablets every day except for Monday, Wednesday, Friday.  On Monday Wednesday Friday he is to take 1-1/2 tablets.  And recheck INR in 4 weeks  07/24/19 INR is subtherapeutic at 1.5 today.  Patient is uncertain of how he is taking the medication.  He is supposed to be on 1 mg tablets on Tuesday, Thursday, Saturday, and Sunday.  He supposed to be taking 1-1/2 mg on Monday, Wednesday, and Friday.  At his last visit his INR was supratherapeutic at 4.7.  Therefore I am concerned that the medication is not being taken properly.  Due to dementia, he is uncertain of how he is taking it.  His daughter relates his pills out for him and he is uncertain of how many pills he  is taking and on which days he take something different Past Medical History:  Diagnosis Date  . Atrial flutter ([place])   . BPH (benign prostatic hyperplasia)   . Coronary artery disease    a. s/p MI tx with Cypher DES to pRCA in 4/04;  b. Echocardiogram 7/09: Normal LV function.  c. Nuclear study 3/13 no ischemia;  d. ETT 2/14 neg;  e. admx with CP => LHC (01/30/2013):  pLAD 40-50%, oD1 70-80%, oOM1 40%, pRCA stent patent, mid RCA 30%, EF 60-65%. => med Rx.  . DDD (degenerative disc disease)   . Diabetes mellitus without complication ([place])   . Dyslipidemia   . Gait disorder 07/13/2018  . Hemorrhoids   . Hx MRSA infection    left buttocks abscess  . Hx of echocardiogram    a. Echocardiogram (01/31/2013): Mild focal basal and mild concentric hypertrophy of the septum, EF 50-55%, normal wall motion, grade 1 diastolic dysfunction, trivial AI, MAC, mild LAE, PASP 35  . Hyperlipidemia   . Hypertension   . Hypothyroidism   . Meniere's disease    Status post shunt  . Myocardial infarction ([place]) 2008  . Occlusion and stenosis of carotid artery without mention of cerebral infarction    40-59% on carotid doppler 2014; [place] (01/2013): R 1-39%, L 60-79%  . Rotator cuff injury  chronic rotator cuff injury status post repair  . Seizure disorder ([place])   . Stroke [facility])    a. 01/2013=> post cardiac cath CVA to L post communicating artery system; R sided weakness  . Syncope    Past Surgical History:  Procedure Laterality Date  . COLONOSCOPY    . CORONARY ANGIOPLASTY WITH STENT PLACEMENT  07/14/2002   Stent to the right coronary artery  . ENDOLYMPHATIC SHUNT DECOMPRESSION  06/11/2009    Right endolymphatic sac decompression and shunt  placement  . HERNIA REPAIR  04/10/2008   scrotal hernia repair  . LEFT HEART CATHETERIZATION WITH CORONARY ANGIOGRAM N/A 01/30/2013   Procedure: LEFT HEART CATHETERIZATION WITH CORONARY ANGIOGRAM;  Surgeon: Larey Dresser, MD;  Location: [facility] CATH LAB;  Service:  Cardiovascular;  Laterality: N/A;  . ROTATOR CUFF REPAIR     for chronic rotator cuff injury   Current Outpatient Medications on File Prior to Visit  Medication Sig Dispense Refill  . acetaminophen (TYLENOL) 325 MG tablet Take 1-2 tablets (325-650 mg total) by mouth every 4 (four) hours as needed for mild pain.    Marland Kitchen albuterol (PROVENTIL HFA;VENTOLIN HFA) 108 (90 Base) MCG/ACT inhaler Inhale 2 puffs into the lungs every 4 (four) hours as needed for wheezing or shortness of breath. 1 Inhaler 0  . atorvastatin (LIPITOR) 80 MG tablet TAKE 1 TABLET (80 MG TOTAL) BY MOUTH DAILY AT 6 PM. 90 tablet 1  . carbamazepine (CARBATROL) 300 MG 12 hr capsule TAKE 1 CAPSULE BY MOUTH TWICE A DAY 180 capsule 3  . cloNIDine (CATAPRES) 0.2 MG tablet TAKE 1 TABLET BY MOUTH 2 TIMES DAILY 180 tablet 3  . escitalopram (LEXAPRO) 5 MG tablet TAKE 1 TABLET BY MOUTH EVERYDAY AT BEDTIME 90 tablet 1  . furosemide (LASIX) 20 MG tablet TAKE 1/2 TABLET BY MOUTH DAILY 45 tablet 2  . glucose blood test strip 1 each by Other route as needed. Use as instructed 100 each 3  . glucose monitoring kit (FREESTYLE) monitoring kit 1 each by Does not apply route as needed. 1 each 0  . isosorbide mononitrate (IMDUR) 60 MG 24 hr tablet Take 1 tablet (60 mg total) by mouth daily. 90 tablet 3  . Lancets (ACCU-CHEK SAFE-T PRO) lancets 1 each by Other route as needed. Use as instructed 100 each 3  . levETIRAcetam (KEPPRA) 250 MG tablet Take 1 tablet (250 mg total) by mouth 2 (two) times daily. 180 tablet 1  . levothyroxine (SYNTHROID) 75 MCG tablet TAKE 1 TABLET BY MOUTH EVERY DAY 90 tablet 3  . losartan (COZAAR) 100 MG tablet Take 1 tablet (100 mg total) by mouth daily. 90 tablet 3  . metFORMIN (GLUCOPHAGE) 500 MG tablet TAKE 1 TABLET BY MOUTH TWICE A DAY WITH MEALS 180 tablet 2  . Multiple Vitamins-Minerals (MULTIVITAMINS THER. W/MINERALS) TABS Take 1 tablet by mouth at bedtime.      Marland Kitchen warfarin (COUMADIN) 7.5 MG tablet TAKE 1&1/2 TABS TUESDAY,  THURSDAY & SATURDAY*1 TAB ON SUNDAY, MONDAY, WEDNESDAY & FRIDAY WITH SUPPER (Patient taking differently: TAKE 1&1/2 TABS TUESDAY, THURSDAY and 1 TAB ON Aldona Lento, WEDNESDAY & Friday, Sat WITH SUPPER) 240 tablet 1   No current facility-administered medications on file prior to visit.   Allergies  Allergen Reactions  . Penicillins     Unknown reaction   Social History   Socioeconomic History  . Marital status: Married    Spouse name: Not on file  . Number of children: 4  . Years of education:  12th  . Highest education level: Not on file  Occupational History  . Occupation: Retired  Tobacco Use  . Smoking status: Former Smoker    Years: 5.00    Types: Cigarettes    Quit date: 08/19/1956    Years since quitting: 62.9  . Smokeless tobacco: Former Systems developer    Types: Chew  Substance and Sexual Activity  . Alcohol use: No  . Drug use: No  . Sexual activity: Never  Other Topics Concern  . Not on file  Social History Narrative   Lives with wife.   Social Determinants of Health   Financial Resource Strain:   . Difficulty of Paying Living Expenses:   Food Insecurity:   . Worried About Charity fundraiser in the Last Year:   . Arboriculturist in the Last Year:   Transportation Needs:   . Film/video editor (Medical):   Marland Kitchen Lack of Transportation (Non-Medical):   Physical Activity:   . Days of Exercise per Week:   . Minutes of Exercise per Session:   Stress:   . Feeling of Stress :   Social Connections:   . Frequency of Communication with Friends and Family:   . Frequency of Social Gatherings with Friends and Family:   . Attends Religious Services:   . Active Member of Clubs or Organizations:   . Attends Archivist Meetings:   Marland Kitchen Marital Status:   Intimate Partner Violence:   . Fear of Current or Ex-Partner:   . Emotionally Abused:   Marland Kitchen Physically Abused:   . Sexually Abused:      Review of Systems  All other systems reviewed and are negative.        Objective:   Physical Exam Vitals reviewed.  Constitutional:      General: He is not in acute distress.    Appearance: Normal appearance. He is obese. He is not ill-appearing, toxic-appearing or diaphoretic.  Cardiovascular:     Rate and Rhythm: Normal rate. Rhythm irregular.  Pulmonary:     Effort: Pulmonary effort is normal. No respiratory distress.     Breath sounds: Normal breath sounds. No wheezing, rhonchi or rales.  Musculoskeletal:     Right lower leg: No edema.     Left lower leg: No edema.  Neurological:     Mental Status: He is alert.           Assessment & Plan:  Paroxysmal atrial fibrillation ([place]) - Plan: PT with INR/Fingerstick INR is subtherapeutic.  If the patient is taking 1 mg 4 days a week and 1-1/2 mg 3 days a week, I would switch his dose to 1-1/2 mg on Monday, Wednesday, Friday, and Sunday and 1 mg on Tuesday, Thursday, Saturday and recheck INR in 4 weeks.  Patient also forgets to reschedule INR checks.  Therefore I have encouraged both he and his wife to have their family supervise the medications more closely.  I believe that their dementia is worsening and the failure forgetting to take medication

## 2019-07-27 ENCOUNTER — Other Ambulatory Visit: Payer: Self-pay | Admitting: Family Medicine

## 2019-08-17 ENCOUNTER — Other Ambulatory Visit: Payer: Self-pay | Admitting: Physical Medicine & Rehabilitation

## 2019-08-21 ENCOUNTER — Other Ambulatory Visit: Payer: Self-pay

## 2019-08-21 ENCOUNTER — Ambulatory Visit (INDEPENDENT_AMBULATORY_CARE_PROVIDER_SITE_OTHER): Payer: Medicare Other | Admitting: Family Medicine

## 2019-08-21 ENCOUNTER — Other Ambulatory Visit: Payer: Self-pay | Admitting: Family Medicine

## 2019-08-21 VITALS — BP 130/68 | HR 65 | Temp 97.3°F | Ht 65.0 in | Wt 178.0 lb

## 2019-08-21 DIAGNOSIS — I480 Paroxysmal atrial fibrillation: Secondary | ICD-10-CM

## 2019-08-21 LAB — PT WITH INR/FINGERSTICK
INR, fingerstick: 2.4 ratio — ABNORMAL HIGH
PT, fingerstick: 28.6 s — ABNORMAL HIGH (ref 10.5–13.1)

## 2019-08-21 MED ORDER — ESCITALOPRAM OXALATE 5 MG PO TABS: ORAL_TABLET | ORAL | 1 refills | Status: DC

## 2019-08-21 NOTE — Progress Notes (Signed)
Subjective:    Patient ID: [name], male    DOB: Sep 02, 1933, 86 y.o.   MRN: [id number]  HPI Patient is here today to recheck his INR.  He is currently on Coumadin tablets.  He takes 1-1/2 tablets on Monday, Wednesday, Friday, and Sunday.  He takes 1 tablet on Tuesday, Thursday, Saturday.  INR today is 2.4 and therapeutic.  He denies any bleeding or bruising.  He is taking his medication as currently as prescribed.  He has not missed any doses.  He denies any shortness of breath, chest pain, palpitations, syncope, or near syncope.  He does report generalized weakness and fatigue that he attributes to age.  His wife's dementia is also worsening and today they were requested an x-ray of her right shoulder as she is having increasing pain in the right shoulder joint. Past Medical History:  Diagnosis Date  . Atrial flutter ([place])   . BPH (benign prostatic hyperplasia)   . Coronary artery disease    a. s/p MI tx with Cypher DES to pRCA in 4/04;  b. Echocardiogram 7/09: Normal LV function.  c. Nuclear study 3/13 no ischemia;  d. ETT 2/14 neg;  e. admx with CP => LHC (01/30/2013):  pLAD 40-50%, oD1 70-80%, oOM1 40%, pRCA stent patent, mid RCA 30%, EF 60-65%. => med Rx.  . DDD (degenerative disc disease)   . Diabetes mellitus without complication ([place])   . Dyslipidemia   . Gait disorder 07/13/2018  . Hemorrhoids   . Hx MRSA infection    left buttocks abscess  . Hx of echocardiogram    a. Echocardiogram (01/31/2013): Mild focal basal and mild concentric hypertrophy of the septum, EF 50-55%, normal wall motion, grade 1 diastolic dysfunction, trivial AI, MAC, mild LAE, PASP 35  . Hyperlipidemia   . Hypertension   . Hypothyroidism   . Meniere's disease    Status post shunt  . Myocardial infarction ([place]) 2008  . Occlusion and stenosis of carotid artery without mention of cerebral infarction    40-59% on carotid doppler 2014; [place] (01/2013): R 1-39%, L 60-79%  . Rotator cuff injury    chronic  rotator cuff injury status post repair  . Seizure disorder ([place])   . Stroke [facility])    a. 01/2013=> post cardiac cath CVA to L post communicating artery system; R sided weakness  . Syncope    Past Surgical History:  Procedure Laterality Date  . COLONOSCOPY    . CORONARY ANGIOPLASTY WITH STENT PLACEMENT  07/14/2002   Stent to the right coronary artery  . ENDOLYMPHATIC SHUNT DECOMPRESSION  06/11/2009    Right endolymphatic sac decompression and shunt  placement  . HERNIA REPAIR  04/10/2008   scrotal hernia repair  . LEFT HEART CATHETERIZATION WITH CORONARY ANGIOGRAM N/A 01/30/2013   Procedure: LEFT HEART CATHETERIZATION WITH CORONARY ANGIOGRAM;  Surgeon: Larey Dresser, MD;  Location: [facility] CATH LAB;  Service: Cardiovascular;  Laterality: N/A;  . ROTATOR CUFF REPAIR     for chronic rotator cuff injury   Current Outpatient Medications on File Prior to Visit  Medication Sig Dispense Refill  . acetaminophen (TYLENOL) 325 MG tablet Take 1-2 tablets (325-650 mg total) by mouth every 4 (four) hours as needed for mild pain.    Marland Kitchen atorvastatin (LIPITOR) 80 MG tablet TAKE 1 TABLET BY MOUTH EVERY DAY 6PM 90 tablet 1  . carbamazepine (CARBATROL) 300 MG 12 hr capsule TAKE 1 CAPSULE BY MOUTH TWICE A DAY 180 capsule 3  . cloNIDine (  CATAPRES) 0.2 MG tablet TAKE 1 TABLET BY MOUTH 2 TIMES DAILY 180 tablet 3  . furosemide (LASIX) 20 MG tablet TAKE 1/2 TABLET BY MOUTH DAILY 45 tablet 2  . glucose blood test strip 1 each by Other route as needed. Use as instructed 100 each 3  . glucose monitoring kit (FREESTYLE) monitoring kit 1 each by Does not apply route as needed. 1 each 0  . isosorbide mononitrate (IMDUR) 60 MG 24 hr tablet Take 1 tablet (60 mg total) by mouth daily. 90 tablet 3  . Lancets (ACCU-CHEK SAFE-T PRO) lancets 1 each by Other route as needed. Use as instructed 100 each 3  . levETIRAcetam (KEPPRA) 250 MG tablet Take 1 tablet (250 mg total) by mouth 2 (two) times daily. 180 tablet 1  .  levothyroxine (SYNTHROID) 75 MCG tablet TAKE 1 TABLET BY MOUTH EVERY DAY 90 tablet 3  . losartan (COZAAR) 100 MG tablet Take 1 tablet (100 mg total) by mouth daily. 90 tablet 3  . metFORMIN (GLUCOPHAGE) 500 MG tablet TAKE 1 TABLET BY MOUTH TWICE A DAY WITH MEALS 180 tablet 2  . Multiple Vitamins-Minerals (MULTIVITAMINS THER. W/MINERALS) TABS Take 1 tablet by mouth at bedtime.      Marland Kitchen warfarin (COUMADIN) 7.5 MG tablet TAKE 1&1/2 TABS TUESDAY, THURSDAY & SATURDAY*1 TAB ON SUNDAY, MONDAY, WEDNESDAY & FRIDAY WITH SUPPER (Patient taking differently: TAKE 1&1/2 TABS TUESDAY, THURSDAY and 1 TAB ON SUNDAY, MONDAY, WEDNESDAY & Friday, Sat WITH SUPPER) 240 tablet 1  . albuterol (PROVENTIL HFA;VENTOLIN HFA) 108 (90 Base) MCG/ACT inhaler Inhale 2 puffs into the lungs every 4 (four) hours as needed for wheezing or shortness of breath. (Patient not taking: Reported on 08/21/2019) 1 Inhaler 0   No current facility-administered medications on file prior to visit.   Allergies  Allergen Reactions  . Penicillins     Unknown reaction   Social History   Socioeconomic History  . Marital status: Married    Spouse name: Not on file  . Number of children: 4  . Years of education: 12th  . Highest education level: Not on file  Occupational History  . Occupation: Retired  Tobacco Use  . Smoking status: Former Smoker    Years: 5.00    Types: Cigarettes    Quit date: 08/19/1956    Years since quitting: 63.0  . Smokeless tobacco: Former Systems developer    Types: Chew  Substance and Sexual Activity  . Alcohol use: No  . Drug use: No  . Sexual activity: Never  Other Topics Concern  . Not on file  Social History Narrative   Lives with wife.   Social Determinants of Health   Financial Resource Strain:   . Difficulty of Paying Living Expenses:   Food Insecurity:   . Worried About Charity fundraiser in the Last Year:   . Arboriculturist in the Last Year:   Transportation Needs:   . Film/video editor (Medical):    Marland Kitchen Lack of Transportation (Non-Medical):   Physical Activity:   . Days of Exercise per Week:   . Minutes of Exercise per Session:   Stress:   . Feeling of Stress :   Social Connections:   . Frequency of Communication with Friends and Family:   . Frequency of Social Gatherings with Friends and Family:   . Attends Religious Services:   . Active Member of Clubs or Organizations:   . Attends Archivist Meetings:   Marland Kitchen Marital Status:  Intimate Partner Violence:   . Fear of Current or Ex-Partner:   . Emotionally Abused:   Marland Kitchen Physically Abused:   . Sexually Abused:      Review of Systems  All other systems reviewed and are negative.      Objective:   Physical Exam Vitals reviewed.  Constitutional:      General: He is not in acute distress.    Appearance: Normal appearance. He is obese. He is not ill-appearing, toxic-appearing or diaphoretic.  Cardiovascular:     Rate and Rhythm: Normal rate. Rhythm irregular.  Pulmonary:     Effort: Pulmonary effort is normal. No respiratory distress.     Breath sounds: Normal breath sounds. No wheezing, rhonchi or rales.  Musculoskeletal:     Right lower leg: No edema.     Left lower leg: No edema.  Neurological:     Mental Status: He is alert.           Assessment & Plan:  Paroxysmal atrial fibrillation ([place]) - Plan: PT with INR/Fingerstick INR is therapeutic.  Continue Coumadin at 1-1/2 tablets on Monday, Wednesday, Friday, and Sunday.  Take 1 tablet on Tuesday, Thursday, Saturday.  Recheck INR in 6 weeks.  I saw the patient's wife while she was in the office today.  We decided to increase her Aricept to 10 mg a day from 5 which she is currently taking as her dementia seems to be worsening.  I also ordered an x-ray of her right shoulder as she has considerable pain with range of motion particularly abduction greater than 90 degrees.  Await the results of the x-ray and then plan to see the patient back as a formal visit to  discuss treatment strategies.

## 2019-08-21 NOTE — Telephone Encounter (Signed)
Sent refill request 08/21/19 for [employer]

## 2019-08-29 ENCOUNTER — Other Ambulatory Visit: Payer: Self-pay | Admitting: Physician Assistant

## 2019-09-05 ENCOUNTER — Other Ambulatory Visit: Payer: Self-pay

## 2019-09-06 ENCOUNTER — Other Ambulatory Visit: Payer: Self-pay

## 2019-09-18 ENCOUNTER — Emergency Department (HOSPITAL_COMMUNITY): Payer: Medicare Other

## 2019-09-18 ENCOUNTER — Ambulatory Visit (INDEPENDENT_AMBULATORY_CARE_PROVIDER_SITE_OTHER): Payer: Medicare Other | Admitting: Nurse Practitioner

## 2019-09-18 ENCOUNTER — Other Ambulatory Visit: Payer: Self-pay

## 2019-09-18 ENCOUNTER — Encounter (HOSPITAL_COMMUNITY): Payer: Self-pay | Admitting: Emergency Medicine

## 2019-09-18 ENCOUNTER — Emergency Department (HOSPITAL_COMMUNITY)
Admission: EM | Admit: 2019-09-18 | Discharge: 2019-09-19 | Disposition: A | Discharge status: Home / Self Care | Payer: Medicare Other | Attending: Emergency Medicine | Admitting: Emergency Medicine

## 2019-09-18 VITALS — BP 130/68 | HR 65 | Temp 97.6°F | Resp 18 | Wt 179.4 lb

## 2019-09-18 DIAGNOSIS — I708 Atherosclerosis of other arteries: Secondary | ICD-10-CM | POA: Diagnosis not present

## 2019-09-18 DIAGNOSIS — I10 Essential (primary) hypertension: Secondary | ICD-10-CM

## 2019-09-18 DIAGNOSIS — I739 Peripheral vascular disease, unspecified: Secondary | ICD-10-CM | POA: Diagnosis not present

## 2019-09-18 DIAGNOSIS — Z87891 Personal history of nicotine dependence: Secondary | ICD-10-CM | POA: Insufficient documentation

## 2019-09-18 DIAGNOSIS — I616 Nontraumatic intracerebral hemorrhage, multiple localized: Secondary | ICD-10-CM | POA: Diagnosis not present

## 2019-09-18 DIAGNOSIS — R410 Disorientation, unspecified: Secondary | ICD-10-CM | POA: Diagnosis not present

## 2019-09-18 DIAGNOSIS — R031 Nonspecific low blood-pressure reading: Secondary | ICD-10-CM | POA: Diagnosis not present

## 2019-09-18 DIAGNOSIS — I2510 Atherosclerotic heart disease of native coronary artery without angina pectoris: Secondary | ICD-10-CM | POA: Insufficient documentation

## 2019-09-18 DIAGNOSIS — R9082 White matter disease, unspecified: Secondary | ICD-10-CM | POA: Diagnosis not present

## 2019-09-18 DIAGNOSIS — R531 Weakness: Secondary | ICD-10-CM | POA: Diagnosis not present

## 2019-09-18 DIAGNOSIS — R399 Unspecified symptoms and signs involving the genitourinary system: Secondary | ICD-10-CM

## 2019-09-18 DIAGNOSIS — I672 Cerebral atherosclerosis: Secondary | ICD-10-CM | POA: Diagnosis not present

## 2019-09-18 DIAGNOSIS — Z79899 Other long term (current) drug therapy: Secondary | ICD-10-CM | POA: Diagnosis not present

## 2019-09-18 DIAGNOSIS — I5031 Acute diastolic (congestive) heart failure: Secondary | ICD-10-CM | POA: Insufficient documentation

## 2019-09-18 DIAGNOSIS — I480 Paroxysmal atrial fibrillation: Secondary | ICD-10-CM

## 2019-09-18 DIAGNOSIS — Z794 Long term (current) use of insulin: Secondary | ICD-10-CM | POA: Diagnosis not present

## 2019-09-18 DIAGNOSIS — I110 Hypertensive heart disease with heart failure: Secondary | ICD-10-CM | POA: Diagnosis not present

## 2019-09-18 DIAGNOSIS — E119 Type 2 diabetes mellitus without complications: Secondary | ICD-10-CM | POA: Diagnosis not present

## 2019-09-18 DIAGNOSIS — R200 Anesthesia of skin: Secondary | ICD-10-CM | POA: Diagnosis present

## 2019-09-18 DIAGNOSIS — G319 Degenerative disease of nervous system, unspecified: Secondary | ICD-10-CM | POA: Diagnosis not present

## 2019-09-18 DIAGNOSIS — I4891 Unspecified atrial fibrillation: Secondary | ICD-10-CM | POA: Insufficient documentation

## 2019-09-18 DIAGNOSIS — M25561 Pain in right knee: Secondary | ICD-10-CM | POA: Diagnosis not present

## 2019-09-18 LAB — CBC
HCT: 34.2 % — ABNORMAL LOW (ref 39.0–52.0)
Hemoglobin: 11 g/dL — ABNORMAL LOW (ref 13.0–17.0)
MCH: 30.5 pg (ref 26.0–34.0)
MCHC: 32.2 g/dL (ref 30.0–36.0)
MCV: 94.7 fL (ref 80.0–100.0)
Platelets: 260 10*3/uL (ref 150–400)
RBC: 3.61 MIL/uL — ABNORMAL LOW (ref 4.22–5.81)
RDW: 16 % — ABNORMAL HIGH (ref 11.5–15.5)
WBC: 6.3 10*3/uL (ref 4.0–10.5)
nRBC: 0 % (ref 0.0–0.2)

## 2019-09-18 LAB — I-STAT CHEM 8, ED
BUN: 29 mg/dL — ABNORMAL HIGH (ref 8–23)
Calcium, Ion: 1.08 mmol/L — ABNORMAL LOW (ref 1.15–1.40)
Chloride: 101 mmol/L (ref 98–111)
Creatinine, Ser: 1.2 mg/dL (ref 0.61–1.24)
Glucose, Bld: 196 mg/dL — ABNORMAL HIGH (ref 70–99)
HCT: 32 % — ABNORMAL LOW (ref 39.0–52.0)
Hemoglobin: 10.9 g/dL — ABNORMAL LOW (ref 13.0–17.0)
Potassium: 4.3 mmol/L (ref 3.5–5.1)
Sodium: 135 mmol/L (ref 135–145)
TCO2: 29 mmol/L (ref 22–32)

## 2019-09-18 LAB — URINALYSIS, ROUTINE W REFLEX MICROSCOPIC
Bilirubin Urine: NEGATIVE
Glucose, UA: NEGATIVE
Hgb urine dipstick: NEGATIVE
Ketones, ur: NEGATIVE
Leukocytes,Ua: NEGATIVE
Nitrite: NEGATIVE
Protein, ur: NEGATIVE
Specific Gravity, Urine: 1.015 (ref 1.001–1.03)
pH: 5.5 (ref 5.0–8.0)

## 2019-09-18 LAB — APTT: aPTT: 29 seconds (ref 24–36)

## 2019-09-18 LAB — COMPREHENSIVE METABOLIC PANEL
ALT: 21 U/L (ref 0–44)
AST: 23 U/L (ref 15–41)
Albumin: 3.8 g/dL (ref 3.5–5.0)
Alkaline Phosphatase: 72 U/L (ref 38–126)
Anion gap: 9 (ref 5–15)
BUN: 29 mg/dL — ABNORMAL HIGH (ref 8–23)
CO2: 27 mmol/L (ref 22–32)
Calcium: 9.1 mg/dL (ref 8.9–10.3)
Chloride: 101 mmol/L (ref 98–111)
Creatinine, Ser: 1.13 mg/dL (ref 0.61–1.24)
GFR calc Af Amer: >60 mL/min (ref >60)
GFR calc non Af Amer: 59 mL/min — ABNORMAL LOW (ref >60)
Glucose, Bld: 195 mg/dL — ABNORMAL HIGH (ref 70–99)
Potassium: 4.5 mmol/L (ref 3.5–5.1)
Sodium: 137 mmol/L (ref 135–145)
Total Bilirubin: 0.5 mg/dL (ref 0.3–1.2)
Total Protein: 6.1 g/dL — ABNORMAL LOW (ref 6.5–8.1)

## 2019-09-18 LAB — DIFFERENTIAL
Abs Immature Granulocytes: 0.04 10*3/uL (ref 0.00–0.07)
Basophils Absolute: 0.1 10*3/uL (ref 0.0–0.1)
Basophils Relative: 1 %
Eosinophils Absolute: 0.4 10*3/uL (ref 0.0–0.5)
Eosinophils Relative: 6 %
Immature Granulocytes: 1 %
Lymphocytes Relative: 30 %
Lymphs Abs: 1.9 10*3/uL (ref 0.7–4.0)
Monocytes Absolute: 0.7 10*3/uL (ref 0.1–1.0)
Monocytes Relative: 11 %
Neutro Abs: 3.2 10*3/uL (ref 1.7–7.7)
Neutrophils Relative %: 51 %

## 2019-09-18 LAB — PROTIME-INR
INR: 1.4 — ABNORMAL HIGH (ref 0.8–1.2)
Prothrombin Time: 16.3 seconds — ABNORMAL HIGH (ref 11.4–15.2)

## 2019-09-18 MED ORDER — SODIUM CHLORIDE 0.9% FLUSH: 3.0000 mL | Freq: Once | INTRAVENOUS | Status: DC

## 2019-09-18 NOTE — Progress Notes (Signed)
Established Patient Office Visit  Subjective:  Patient ID: [name], male    DOB: 15-Aug-1933  Age: 86 y.o. MRN: [id number]  CC:  Chief Complaint  Patient presents with  . Flank Pain    R side, x2 days, urinated 1x today  . Leg Pain    R leg    HPI [name] is a 86 year old male accompanied by two family members with history of afib, CAD, HTN, DM, hyperlipidemia, CVS who is presenting to the clinic with episodal sxs yesterday of blurred vision, foggy head. He does report with his h/o stroke having residual numbness and mild weakness to his right upper extremity. He uses a walker and continues to do so without increased difficulties. He also reports approx. 6 month historical injury to the right shoulder and right knee r/t a fall that since yesterday and present today has been acting up with increase numbness and discomfort. He reports this is similar sxs he usually has except worse than usual yesterday but the same as normal today. He denied having the blurred vision or foggy head sxs today. He denied cp, ct, n, v, syncope, near, syncope, headache. No weakness, facial asymmetry, difficulty swallowing.   His family that is present today denied any asphasia, dysphasia, confusion.   Past Medical History:  Diagnosis Date  . Atrial flutter ([place])   . BPH (benign prostatic hyperplasia)   . Coronary artery disease    a. s/p MI tx with Cypher DES to pRCA in 4/04;  b. Echocardiogram 7/09: Normal LV function.  c. Nuclear study 3/13 no ischemia;  d. ETT 2/14 neg;  e. admx with CP => LHC (01/30/2013):  pLAD 40-50%, oD1 70-80%, oOM1 40%, pRCA stent patent, mid RCA 30%, EF 60-65%. => med Rx.  . DDD (degenerative disc disease)   . Diabetes mellitus without complication ([place])   . Dyslipidemia   . Gait disorder 07/13/2018  . Hemorrhoids   . Hx MRSA infection    left buttocks abscess  . Hx of echocardiogram    a. Echocardiogram (01/31/2013): Mild focal basal and mild concentric  hypertrophy of the septum, EF 50-55%, normal wall motion, grade 1 diastolic dysfunction, trivial AI, MAC, mild LAE, PASP 35  . Hyperlipidemia   . Hypertension   . Hypothyroidism   . Meniere's disease    Status post shunt  . Myocardial infarction ([place]) 2008  . Occlusion and stenosis of carotid artery without mention of cerebral infarction    40-59% on carotid doppler 2014; [place] (01/2013): R 1-39%, L 60-79%  . Rotator cuff injury    chronic rotator cuff injury status post repair  . Seizure disorder ([place])   . Stroke [facility])    a. 01/2013=> post cardiac cath CVA to L post communicating artery system; R sided weakness  . Syncope     Past Surgical History:  Procedure Laterality Date  . COLONOSCOPY    . CORONARY ANGIOPLASTY WITH STENT PLACEMENT  07/14/2002   Stent to the right coronary artery  . ENDOLYMPHATIC SHUNT DECOMPRESSION  06/11/2009    Right endolymphatic sac decompression and shunt  placement  . HERNIA REPAIR  04/10/2008   scrotal hernia repair  . LEFT HEART CATHETERIZATION WITH CORONARY ANGIOGRAM N/A 01/30/2013   Procedure: LEFT HEART CATHETERIZATION WITH CORONARY ANGIOGRAM;  Surgeon: Larey Dresser, MD;  Location: [facility] CATH LAB;  Service: Cardiovascular;  Laterality: N/A;  . ROTATOR CUFF REPAIR     for chronic rotator cuff injury  Family History  Problem Relation Age of Onset  . Heart attack Father 76  . Aneurysm Mother        brain aneurysm  . Coronary artery disease Brother        with CABG  . Diabetes Brother   . Colon cancer Neg Hx   . Colon polyps Neg Hx     Social History   Socioeconomic History  . Marital status: Married    Spouse name: Not on file  . Number of children: 4  . Years of education: 12th  . Highest education level: Not on file  Occupational History  . Occupation: Retired  Tobacco Use  . Smoking status: Former Smoker    Years: 5.00    Types: Cigarettes    Quit date: 08/19/1956    Years since quitting: 63.1  . Smokeless tobacco: Former  Systems developer    Types: Secondary school teacher  . Vaping Use: Never used  Substance and Sexual Activity  . Alcohol use: No  . Drug use: No  . Sexual activity: Never  Other Topics Concern  . Not on file  Social History Narrative   Lives with wife.   Social Determinants of Health   Financial Resource Strain:   . Difficulty of Paying Living Expenses:   Food Insecurity:   . Worried About Charity fundraiser in the Last Year:   . Arboriculturist in the Last Year:   Transportation Needs:   . Film/video editor (Medical):   Marland Kitchen Lack of Transportation (Non-Medical):   Physical Activity:   . Days of Exercise per Week:   . Minutes of Exercise per Session:   Stress:   . Feeling of Stress :   Social Connections:   . Frequency of Communication with Friends and Family:   . Frequency of Social Gatherings with Friends and Family:   . Attends Religious Services:   . Active Member of Clubs or Organizations:   . Attends Archivist Meetings:   Marland Kitchen Marital Status:   Intimate Partner Violence:   . Fear of Current or Ex-Partner:   . Emotionally Abused:   Marland Kitchen Physically Abused:   . Sexually Abused:     No facility-administered medications prior to visit.   Outpatient Medications Prior to Visit  Medication Sig Dispense Refill  . acetaminophen (TYLENOL) 325 MG tablet Take 1-2 tablets (325-650 mg total) by mouth every 4 (four) hours as needed for mild pain.    Marland Kitchen albuterol (PROVENTIL HFA;VENTOLIN HFA) 108 (90 Base) MCG/ACT inhaler Inhale 2 puffs into the lungs every 4 (four) hours as needed for wheezing or shortness of breath. 1 Inhaler 0  . atorvastatin (LIPITOR) 80 MG tablet TAKE 1 TABLET BY MOUTH EVERY DAY 6PM 90 tablet 1  . carbamazepine (CARBATROL) 300 MG 12 hr capsule TAKE 1 CAPSULE BY MOUTH TWICE A DAY 180 capsule 3  . cloNIDine (CATAPRES) 0.2 MG tablet TAKE 1 TABLET BY MOUTH 2 TIMES DAILY 180 tablet 3  . escitalopram (LEXAPRO) 5 MG tablet TAKE 1 TABLET BY MOUTH EVERYDAY AT BEDTIME 90 tablet 1   . furosemide (LASIX) 20 MG tablet TAKE 1/2 TABLET BY MOUTH DAILY 45 tablet 2  . glucose blood test strip 1 each by Other route as needed. Use as instructed 100 each 3  . glucose monitoring kit (FREESTYLE) monitoring kit 1 each by Does not apply route as needed. 1 each 0  . isosorbide mononitrate (IMDUR) 60 MG 24 hr tablet TAKE 1 TABLET  BY MOUTH EVERY DAY 90 tablet 0  . Lancets (ACCU-CHEK SAFE-T PRO) lancets 1 each by Other route as needed. Use as instructed 100 each 3  . levETIRAcetam (KEPPRA) 250 MG tablet Take 1 tablet (250 mg total) by mouth 2 (two) times daily. 180 tablet 1  . levothyroxine (SYNTHROID) 75 MCG tablet TAKE 1 TABLET BY MOUTH EVERY DAY 90 tablet 3  . losartan (COZAAR) 100 MG tablet Take 1 tablet (100 mg total) by mouth daily. 90 tablet 3  . metFORMIN (GLUCOPHAGE) 500 MG tablet TAKE 1 TABLET BY MOUTH TWICE A DAY WITH MEALS 180 tablet 2  . Multiple Vitamins-Minerals (MULTIVITAMINS THER. W/MINERALS) TABS Take 1 tablet by mouth at bedtime.      Marland Kitchen warfarin (COUMADIN) 7.5 MG tablet TAKE 1&1/2 TABS TUESDAY, THURSDAY & SATURDAY*1 TAB ON SUNDAY, MONDAY, WEDNESDAY & FRIDAY WITH SUPPER (Patient taking differently: TAKE 1&1/2 TABS TUESDAY, THURSDAY and 1 TAB ON Aldona Lento, [facility] & Friday, Sat WITH SUPPER) 240 tablet 1    Allergies  Allergen Reactions  . Penicillins     Unknown reaction    ROS Review of Systems  All other systems reviewed and are negative.     Objective:    Physical Exam Vitals and nursing note reviewed.  Constitutional:      Appearance: Normal appearance. He is well-developed and well-groomed.  HENT:     Head: Normocephalic.     Right Ear: External ear normal.     Left Ear: External ear normal.     Nose: Nose normal.  Eyes:     Extraocular Movements: Extraocular movements intact.     Conjunctiva/sclera: Conjunctivae normal.     Pupils: Pupils are equal, round, and reactive to light.  Neck:     Vascular: No carotid bruit.  Cardiovascular:      Rate and Rhythm: Normal rate and regular rhythm.     Pulses: Normal pulses.     Heart sounds: Normal heart sounds.  Pulmonary:     Effort: Pulmonary effort is normal.     Breath sounds: Normal breath sounds.  Abdominal:     General: There is no distension.     Tenderness: There is no right CVA tenderness, left CVA tenderness, guarding or rebound.  Musculoskeletal:        General: No swelling. Normal range of motion.     Cervical back: Normal range of motion and neck supple.     Right lower leg: No edema.     Left lower leg: No edema.  Skin:    General: Skin is warm and dry.     Capillary Refill: Capillary refill takes less than 2 seconds.     Coloration: Skin is not jaundiced.     Findings: No bruising or erythema.  Neurological:     General: No focal deficit present.     Mental Status: He is alert and oriented to person, place, and time.     GCS: GCS eye subscore is 4. GCS verbal subscore is 5. GCS motor subscore is 6.     Cranial Nerves: Cranial nerves are intact.     Sensory: Sensation is intact.     Motor: Motor function is intact.     Deep Tendon Reflexes: Reflexes are normal and symmetric.  Psychiatric:        Attention and Perception: Attention and perception normal.        Mood and Affect: Mood and affect normal.        Speech: Speech normal.  Behavior: Behavior normal. Behavior is cooperative.        Thought Content: Thought content normal.        Cognition and Memory: Cognition and memory normal.        Judgment: Judgment normal.     BP 130/68 (BP Location: Left Arm, Patient Position: Sitting, Cuff Size: Normal)   Pulse 65   Temp 97.6 F (36.4 C) (Temporal)   Resp 18   Wt 179 lb 6.4 oz (81.4 kg)   SpO2 93%   BMI 29.85 kg/m  Wt Readings from Last 3 Encounters:  09/18/19 179 lb (81.2 kg)  09/18/19 179 lb 6.4 oz (81.4 kg)  08/21/19 178 lb (80.7 kg)     Health Maintenance Due  Topic Date Due  . OPHTHALMOLOGY EXAM  09/27/2018    There are no  preventive care reminders to display for this patient.  Lab Results  Component Value Date   TSH 4.07 05/03/2019   Lab Results  Component Value Date   WBC 6.3 09/18/2019   HGB 10.9 (L) 09/18/2019   HCT 32.0 (L) 09/18/2019   MCV 94.7 09/18/2019   PLT 260 09/18/2019   Lab Results  Component Value Date   NA 135 09/18/2019   K 4.3 09/18/2019   CO2 27 09/18/2019   GLUCOSE 196 (H) 09/18/2019   BUN 29 (H) 09/18/2019   CREATININE 1.20 09/18/2019   BILITOT 0.5 09/18/2019   ALKPHOS 72 09/18/2019   AST 23 09/18/2019   ALT 21 09/18/2019   PROT 6.1 (L) 09/18/2019   ALBUMIN 3.8 09/18/2019   CALCIUM 9.1 09/18/2019   ANIONGAP 9 09/18/2019   Lab Results  Component Value Date   CHOL 179 05/30/2018   Lab Results  Component Value Date   HDL 50 05/30/2018   Lab Results  Component Value Date   LDLCALC 103 (H) 05/30/2018   Lab Results  Component Value Date   TRIG 131 05/30/2018   Lab Results  Component Value Date   CHOLHDL 3.6 05/30/2018   Lab Results  Component Value Date   HGBA1C 6.0 (H) 05/03/2019      Assessment & Plan:   Problem List Items Addressed This Visit      Cardiovascular and Mediastinum   HTN (hypertension)   Paroxysmal atrial fibrillation ([place]) - Primary   Relevant Orders   EKG 12-Lead (Completed)    Other Visit Diagnoses    Low blood pressure reading       UTI symptoms       Relevant Orders   Urinalysis, Routine w reflex microscopic (Completed)    Your blood pressure reading is normal after being taken again today in clinic.  Your urine is normal negative for UTI EKG shows you are in NSR: Most likely you may have felt sxs you experienced yesterday could be from afib to NSR change yesterday and possibly had what is called TIA Trans Ischemic Attack as your most recent comparable EKG's show afib.  Stroke warning signs discussed with you and your family. Since you are symptom free now your are okay to go home however if you have any return of symptoms  or sxs as discussed and educated call 911 or go to nearest ER.    A stroke is a medical emergency and should be treated right away--every second counts.  "BE FAST" is an easy way to remember the main warning signs of a stroke.  Call local emergency services right away if you or someone else has any stroke symptoms,  even if the symptoms go away.  Make note of what time the first symptoms appeared. Emergency responders or emergency room staff will need to know this information.  Do not wait to see if symptoms will go away. Call 911 even if only one of the "BE FAST" symptoms appears. I have discussed with you and your family today that proceeding to the ER would be prudent to rule out stroke although you seem to be stable at this time. The family prefers to drive the pt.   No orders of the defined types were placed in this encounter.   Follow-up: Return if symptoms worsen or fail to improve.    Annie Main, FNP

## 2019-09-18 NOTE — ED Triage Notes (Signed)
Pt reports he had a stroke approx. 10 years ago with residual numbness on the right side. C/o increased numbness to the right arm x 2 days. No weakness or other neuro deficits noted. A&O x 4.

## 2019-09-19 ENCOUNTER — Emergency Department (HOSPITAL_COMMUNITY): Payer: Medicare Other

## 2019-09-19 DIAGNOSIS — R410 Disorientation, unspecified: Secondary | ICD-10-CM | POA: Diagnosis not present

## 2019-09-19 DIAGNOSIS — I616 Nontraumatic intracerebral hemorrhage, multiple localized: Secondary | ICD-10-CM | POA: Diagnosis not present

## 2019-09-19 DIAGNOSIS — R531 Weakness: Secondary | ICD-10-CM | POA: Diagnosis not present

## 2019-09-19 DIAGNOSIS — R9082 White matter disease, unspecified: Secondary | ICD-10-CM | POA: Diagnosis not present

## 2019-09-19 DIAGNOSIS — M25561 Pain in right knee: Secondary | ICD-10-CM | POA: Diagnosis not present

## 2019-09-19 NOTE — Discharge Instructions (Addendum)
Follow-up with your doctor and your neurologist as needed.

## 2019-09-19 NOTE — ED Provider Notes (Signed)
  Physical Exam  BP (!) 183/80   Pulse 71   Temp 98.4 F (36.9 C) (Oral)   Resp 16   Ht 5\' 8"  (1.727 m)   Wt 81.2 kg   SpO2 100%   BMI 27.22 kg/m   Physical Exam  ED Course/Procedures     Procedures  MDM  Received patient in signout.  Right-sided weakness.  History of previous stroke.  Pending MRI.  MRI done and reassuring.  No acute stroke.  Has had previous stroke and does not appear to need further inpatient work-up for this.  Already anticoagulated.  Discharge home with outpatient follow-up.       Davonna Belling, MD 09/19/19 [phone]

## 2019-09-19 NOTE — ED Provider Notes (Signed)
[facility] EMERGENCY DEPARTMENT Provider Note   CSN: [id number] Arrival date & time: 09/18/19  1820     History Chief Complaint  Patient presents with  . Numbness    [name] is a 86 y.o. male.  HPI     This is an 86 year old male with a history of atrial flutter, coronary artery disease, diabetes, hypertension, hyperlipidemia, stroke who presents with right-sided weakness and numbness.  Patient reports history of stroke with residual right-sided numbness and paresthesias.  However, over the last 1 to 2 days, reports worsening numbness and weakness.  He states specifically he feels like his right leg is more weak.  He has continued to walk with a walker.  He denies any grip issues.  He also reports that he had blurry vision yesterday.  Patient reports that he fell approximately 6 weeks ago into a hole and has had some worsening right knee pain as well which she is unsure whether is related or not.  Denies recent illnesses, fever, cough, chest pain, shortness of breath, abdominal pain, nausea, vomiting, diarrhea.  Patient was seen by his primary provider yesterday with possible concerns for TIA.  Past Medical History:  Diagnosis Date  . Atrial flutter ([place])   . BPH (benign prostatic hyperplasia)   . Coronary artery disease    a. s/p MI tx with Cypher DES to pRCA in 4/04;  b. Echocardiogram 7/09: Normal LV function.  c. Nuclear study 3/13 no ischemia;  d. ETT 2/14 neg;  e. admx with CP => LHC (01/30/2013):  pLAD 40-50%, oD1 70-80%, oOM1 40%, pRCA stent patent, mid RCA 30%, EF 60-65%. => med Rx.  . DDD (degenerative disc disease)   . Diabetes mellitus without complication ([place])   . Dyslipidemia   . Gait disorder 07/13/2018  . Hemorrhoids   . Hx MRSA infection    left buttocks abscess  . Hx of echocardiogram    a. Echocardiogram (01/31/2013): Mild focal basal and mild concentric hypertrophy of the septum, EF 50-55%, normal wall motion, grade 1 diastolic  dysfunction, trivial AI, MAC, mild LAE, PASP 35  . Hyperlipidemia   . Hypertension   . Hypothyroidism   . Meniere's disease    Status post shunt  . Myocardial infarction ([place]) 2008  . Occlusion and stenosis of carotid artery without mention of cerebral infarction    40-59% on carotid doppler 2014; [place] (01/2013): R 1-39%, L 60-79%  . Rotator cuff injury    chronic rotator cuff injury status post repair  . Seizure disorder ([place])   . Stroke [facility])    a. 01/2013=> post cardiac cath CVA to L post communicating artery system; R sided weakness  . Syncope     Patient Active Problem List   Diagnosis Date Noted  . Acute exacerbation of CHF (congestive heart failure) ([place]) 08/19/2018  . Hypertensive urgency 08/19/2018  . Gait disorder 07/13/2018  . Recurrent syncope 07/13/2018  . E. coli UTI 06/19/2018  . Leucocytosis 06/19/2018  . Normal pressure hydrocephalus ([place]) 06/01/2018  . Altered mental status 05/31/2018  . AMS (altered mental status) 05/30/2018  . Seizure disorder ([place]) 05/30/2018  . Fracture of proximal phalanx of finger fifth 06/17/2017 07/21/2017  . Upper airway cough syndrome 11/01/2016  . Atrial flutter ([place]) 08/05/2016  . Coronary artery disease involving native coronary artery of native heart without angina pectoris 08/05/2016  . Chronic anticoagulation 12/31/2015  . History of seizures 12/31/2015  . Acute diastolic CHF (congestive heart failure) ([place]) 12/24/2015  .  Pulmonary edema   . Paroxysmal atrial fibrillation (HCC)   . Hypothyroidism   . Controlled type 2 diabetes mellitus with complication, without long-term current use of insulin ([place])   . Hypertensive emergency 12/20/2015  . Special screening for malignant neoplasms, colon 12/07/2013  . Bowel habit changes 12/07/2013  . Diarrhea 03/09/2013  . History of cardioembolic cerebrovascular accident (CVA) 01/31/2013  . Chest pain 01/29/2013  . Bilateral carotid artery disease ([place])   . Loss of coordination  12/03/2012  . Morbid obesity due to excess calories ([place]) 08/20/2011  . CAD S/P percutaneous coronary angioplasty   . Dyslipidemia   . HTN (hypertension)     Past Surgical History:  Procedure Laterality Date  . COLONOSCOPY    . CORONARY ANGIOPLASTY WITH STENT PLACEMENT  07/14/2002   Stent to the right coronary artery  . ENDOLYMPHATIC SHUNT DECOMPRESSION  06/11/2009    Right endolymphatic sac decompression and shunt  placement  . HERNIA REPAIR  04/10/2008   scrotal hernia repair  . LEFT HEART CATHETERIZATION WITH CORONARY ANGIOGRAM N/A 01/30/2013   Procedure: LEFT HEART CATHETERIZATION WITH CORONARY ANGIOGRAM;  Surgeon: Larey Dresser, MD;  Location: [facility] CATH LAB;  Service: Cardiovascular;  Laterality: N/A;  . ROTATOR CUFF REPAIR     for chronic rotator cuff injury       Family History  Problem Relation Age of Onset  . Heart attack Father 34  . Aneurysm Mother        brain aneurysm  . Coronary artery disease Brother        with CABG  . Diabetes Brother   . Colon cancer Neg Hx   . Colon polyps Neg Hx     Social History   Tobacco Use  . Smoking status: Former Smoker    Years: 5.00    Types: Cigarettes    Quit date: 08/19/1956    Years since quitting: 63.1  . Smokeless tobacco: Former Systems developer    Types: Secondary school teacher  . Vaping Use: Never used  Substance Use Topics  . Alcohol use: No  . Drug use: No    Home Medications Prior to Admission medications   Medication Sig Start Date End Date Taking? Authorizing Provider  acetaminophen (TYLENOL) 325 MG tablet Take 1-2 tablets (325-650 mg total) by mouth every 4 (four) hours as needed for mild pain. 06/15/18   Love, Ivan Anchors, PA-C  albuterol (PROVENTIL HFA;VENTOLIN HFA) 108 (90 Base) MCG/ACT inhaler Inhale 2 puffs into the lungs every 4 (four) hours as needed for wheezing or shortness of breath. 03/17/18   Delsa Grana, PA-C  atorvastatin (LIPITOR) 80 MG tablet TAKE 1 TABLET BY MOUTH EVERY DAY 6PM 07/27/19   [name],  MD  carbamazepine (CARBATROL) 300 MG 12 hr capsule TAKE 1 CAPSULE BY MOUTH TWICE A DAY 12/19/18   [name], MD  cloNIDine (CATAPRES) 0.2 MG tablet TAKE 1 TABLET BY MOUTH 2 TIMES DAILY 01/31/19   [name], MD  escitalopram (LEXAPRO) 5 MG tablet TAKE 1 TABLET BY MOUTH EVERYDAY AT BEDTIME 08/21/19   [name], MD  furosemide (LASIX) 20 MG tablet TAKE 1/2 TABLET BY MOUTH DAILY 06/12/19   Almyra Deforest, PA  glucose blood test strip 1 each by Other route as needed. Use as instructed 08/25/18   Alycia Rossetti, MD  glucose monitoring kit (FREESTYLE) monitoring kit 1 each by Does not apply route as needed. 08/25/18   Alycia Rossetti, MD  isosorbide mononitrate (IMDUR) 60  MG 24 hr tablet TAKE 1 TABLET BY MOUTH EVERY DAY 08/30/19   Almyra Deforest, PA  Lancets (ACCU-CHEK SAFE-T PRO) lancets 1 each by Other route as needed. Use as instructed 08/25/18   Alycia Rossetti, MD  levETIRAcetam (KEPPRA) 250 MG tablet Take 1 tablet (250 mg total) by mouth 2 (two) times daily. 07/09/19   Kathrynn Ducking, MD  levothyroxine (SYNTHROID) 75 MCG tablet TAKE 1 TABLET BY MOUTH EVERY DAY 05/18/19   [name], MD  losartan (COZAAR) 100 MG tablet Take 1 tablet (100 mg total) by mouth daily. 08/25/18   Almyra Deforest, PA  metFORMIN (GLUCOPHAGE) 500 MG tablet TAKE 1 TABLET BY MOUTH TWICE A DAY WITH MEALS 06/11/19   [name], MD  Multiple Vitamins-Minerals (MULTIVITAMINS THER. W/MINERALS) TABS Take 1 tablet by mouth at bedtime.      [provider]  warfarin (COUMADIN) 7.5 MG tablet TAKE 1&1/2 TABS TUESDAY, THURSDAY & SATURDAY*1 TAB ON SUNDAY, MONDAY, [place] WITH SUPPER Patient taking differently: TAKE 1&1/2 TABS TUESDAY, THURSDAY and 1 TAB ON Aldona Lento, [place] & Friday, Sat WITH SUPPER 08/16/18   [name], MD    Allergies    Penicillins  Review of Systems   Review of Systems  Constitutional: Negative for fever.  Eyes: Positive for visual disturbance.  Respiratory:  Negative for shortness of breath.   Cardiovascular: Negative for chest pain.  Gastrointestinal: Negative for abdominal pain, nausea and vomiting.  Musculoskeletal:       Knee pain  Neurological: Positive for weakness and numbness.  All other systems reviewed and are negative.   Physical Exam Updated Vital Signs BP (!) 183/80   Pulse 71   Temp 98.4 F (36.9 C) (Oral)   Resp 16   Ht 1.727 m (_0 )   Wt 81.2 kg   SpO2 100%   BMI 27.22 kg/m   Physical Exam Vitals and nursing note reviewed.  Constitutional:      Appearance: He is well-developed. He is not ill-appearing.  HENT:     Head: Normocephalic and atraumatic.     Nose: Nose normal.     Mouth/Throat:     Mouth: Mucous membranes are moist.  Eyes:     Pupils: Pupils are equal, round, and reactive to light.  Cardiovascular:     Rate and Rhythm: Normal rate and regular rhythm.     Heart sounds: Normal heart sounds. No murmur heard.   Pulmonary:     Effort: Pulmonary effort is normal. No respiratory distress.     Breath sounds: Normal breath sounds. No wheezing.  Abdominal:     General: Bowel sounds are normal.     Palpations: Abdomen is soft.     Tenderness: There is no abdominal tenderness. There is no rebound.  Musculoskeletal:     Cervical back: Neck supple.     Right lower leg: No edema.     Left lower leg: No edema.     Comments: Normal range of motion right knee, slight swelling noted, no effusion or joint line tenderness  Lymphadenopathy:     Cervical: No cervical adenopathy.  Skin:    General: Skin is warm and dry.  Neurological:     Mental Status: He is alert and oriented to person, place, and time.     Comments: Cranial nerves II through XII intact, no dysmetria to finger-nose-finger, give weakness with proximal muscles of the right upper extremity and right lower extremity, normal distal strength, no drift  Psychiatric:        Mood and Affect: Mood normal.     ED Results / Procedures / Treatments    Labs (all labs ordered are listed, but only abnormal results are displayed) Labs Reviewed  PROTIME-INR - Abnormal; Notable for the following components:      Result Value   Prothrombin Time 16.3 (*)    INR 1.4 (*)    All other components within normal limits  CBC - Abnormal; Notable for the following components:   RBC 3.61 (*)    Hemoglobin 11.0 (*)    HCT 34.2 (*)    RDW 16.0 (*)    All other components within normal limits  COMPREHENSIVE METABOLIC PANEL - Abnormal; Notable for the following components:   Glucose, Bld 195 (*)    BUN 29 (*)    Total Protein 6.1 (*)    GFR calc non Af Amer 59 (*)    All other components within normal limits  I-STAT CHEM 8, ED - Abnormal; Notable for the following components:   BUN 29 (*)    Glucose, Bld 196 (*)    Calcium, Ion 1.08 (*)    Hemoglobin 10.9 (*)    HCT 32.0 (*)    All other components within normal limits  APTT  DIFFERENTIAL  CBG MONITORING, ED    EKG EKG Interpretation  Date/Time:  Tuesday September 18 2019 18:32:31 EDT Ventricular Rate:  71 PR Interval:  226 QRS Duration: 134 QT Interval:  394 QTC Calculation: 428 R Axis:   -19 Text Interpretation: Sinus rhythm with 1st degree A-V block Left ventricular hypertrophy with QRS widening and repolarization abnormality ( R in aVL , Cornell product ) Abnormal ECG Confirmed by Thayer Jew [phone]) on 09/19/2019 4:32:01 AM   Radiology CT HEAD WO CONTRAST  Result Date: 09/18/2019 CLINICAL DATA:  Numbness mid body region. Blurred vision. Confusion. EXAM: CT HEAD WITHOUT CONTRAST TECHNIQUE: Contiguous axial images were obtained from the base of the skull through the vertex without intravenous contrast. COMPARISON:  Head CT May 29, 2018; brain MRI May 30, 2018 FINDINGS: Brain: There is mild diffuse atrophy, stable. There is no intracranial mass, hemorrhage, extra-axial fluid collection, or midline shift. There is patchy small vessel disease in the centra semiovale bilaterally.  There is evidence of a prior focal infarct in the left thalamus. There is evidence of a prior infarct in the right internal capsule anteriorly, stable. Small lacunar infarct in the posterior left lentiform nucleus is stable. There is evidence of a prior small infarct in the medial left parietal lobe. No acute infarct is demonstrable currently. Vascular: No hyperdense vessel. There is calcification in each carotid siphon and distal vertebral artery regions. Skull: Bony calvarium appears intact. Sinuses/Orbits: There is mucosal thickening in multiple ethmoid air cells. Other paranasal sinuses are clear. Orbits appear symmetric bilaterally. Other: Stable postoperative change right mastoid region. Mastoids are clear. IMPRESSION: Stable atrophy with periventricular small vessel disease and prior scattered infarcts. No acute infarct evident. No mass or hemorrhage. Foci of arterial vascular calcification noted. Mucosal thickening noted in multiple ethmoid air cells. Postoperative change right mastoid region, stable. Electronically Signed   By: Lowella Grip III M.D.   On: 09/18/2019 20:11   DG Knee Complete 4 Views Right  Result Date: 09/19/2019 CLINICAL DATA:  [place]. Right knee pain. EXAM: RIGHT KNEE - COMPLETE 4+ VIEW COMPARISON:  None. FINDINGS: Advanced tricompartmental degenerative changes with joint space narrowing, osteophytic spurring and mild bony eburnation. Chondrocalcinosis is noted. Bony  density along the superior pole of the patella could be an old avulsion injury or calcific tendinopathy in the quadriceps tendon. Suspect joint effusion. Moderate vascular calcifications. IMPRESSION: 1. Advanced tricompartmental degenerative changes. 2. No definite acute fracture. 3. Suspect joint effusion. Electronically Signed   By: Marijo Sanes M.D.   On: 09/19/2019 06:32    Procedures Procedures (including critical care time)  Medications Ordered in ED Medications  sodium chloride flush (NS) 0.9 % injection  3 mL (has no administration in time range)    ED Course  I have reviewed the triage vital signs and the nursing notes.  Pertinent labs & imaging results that were available during my care of the patient were reviewed by me and considered in my medical decision making (see chart for details).    MDM Rules/Calculators/A&P                           Patient presents with reported worsening numbness on the right side as well as right lower extremity weakness which is new.  States it has been ongoing for the last 1 to 2 days.  He is overall nontoxic and vital signs notable for blood pressure 183/80.  Stroke work-up initiated in triage.  Lab work reviewed.  Slightly hyperglycemic.  He is subtherapeutic with an INR of 1.4.  Head CT is negative for acute bleed.  EKG shows no evidence of acute ischemia or arrhythmia.  He does have some given weakness on the right.  Unclear how acute this is.  Will obtain an MRI to evaluate for acute stroke.  If negative, feel he can be discharged home.  He reports also recent history of fall with knee pain.  X-rays obtained.    Final Clinical Impression(s) / ED Diagnoses Final diagnoses:  None    Rx / DC Orders ED Discharge Orders    None       Keyunna Coco, Barbette Hair, MD 09/19/19 [phone]

## 2019-09-19 NOTE — ED Notes (Signed)
Patient transported to MRI 

## 2019-09-19 NOTE — Patient Instructions (Signed)
Go to ER do not drive; family prefers to drive pt

## 2019-09-19 NOTE — Telephone Encounter (Signed)
Medication pending.

## 2019-09-28 MED ORDER — ISOSORBIDE MONONITRATE ER 60 MG PO TB24: 60.0000 mg | ORAL_TABLET | Freq: Every day | ORAL | 0 refills | Status: DC

## 2019-10-02 ENCOUNTER — Ambulatory Visit (INDEPENDENT_AMBULATORY_CARE_PROVIDER_SITE_OTHER): Payer: Medicare Other | Admitting: Family Medicine

## 2019-10-02 ENCOUNTER — Other Ambulatory Visit: Payer: Self-pay

## 2019-10-02 VITALS — BP 118/60 | HR 73 | Temp 97.7°F | Ht 65.0 in | Wt 179.0 lb

## 2019-10-02 DIAGNOSIS — I480 Paroxysmal atrial fibrillation: Secondary | ICD-10-CM | POA: Diagnosis not present

## 2019-10-02 DIAGNOSIS — Z7901 Long term (current) use of anticoagulants: Secondary | ICD-10-CM

## 2019-10-02 LAB — PT WITH INR/FINGERSTICK
INR, fingerstick: 2.2 ratio — ABNORMAL HIGH
PT, fingerstick: 26.1 s — ABNORMAL HIGH (ref 10.5–13.1)

## 2019-10-02 NOTE — Progress Notes (Signed)
Subjective:    Patient ID: [name], male    DOB: 11-Apr-1933, 86 y.o.   MRN: [id number]  Atrial Fibrillation Past medical history includes atrial fibrillation.   Patient is here today to recheck his INR.  He is currently on Coumadin tablets.  He takes 1-1/2 tablets on Monday, Wednesday, Friday, and Sunday.  He takes 1 tablet on Tuesday, Thursday, Saturday.  INR today is 2.2 and therapeutic.  He denies any bleeding or bruising.  He is taking his medication as currently as prescribed.  He has not missed any doses.  He denies any shortness of breath, chest pain, palpitations, syncope, or near syncope.   Past Medical History:  Diagnosis Date  . Atrial flutter ([place])   . BPH (benign prostatic hyperplasia)   . Coronary artery disease    a. s/p MI tx with Cypher DES to pRCA in 4/04;  b. Echocardiogram 7/09: Normal LV function.  c. Nuclear study 3/13 no ischemia;  d. ETT 2/14 neg;  e. admx with CP => LHC (01/30/2013):  pLAD 40-50%, oD1 70-80%, oOM1 40%, pRCA stent patent, mid RCA 30%, EF 60-65%. => med Rx.  . DDD (degenerative disc disease)   . Diabetes mellitus without complication ([place])   . Dyslipidemia   . Gait disorder 07/13/2018  . Hemorrhoids   . Hx MRSA infection    left buttocks abscess  . Hx of echocardiogram    a. Echocardiogram (01/31/2013): Mild focal basal and mild concentric hypertrophy of the septum, EF 50-55%, normal wall motion, grade 1 diastolic dysfunction, trivial AI, MAC, mild LAE, PASP 35  . Hyperlipidemia   . Hypertension   . Hypothyroidism   . Meniere's disease    Status post shunt  . Myocardial infarction ([place]) 2008  . Occlusion and stenosis of carotid artery without mention of cerebral infarction    40-59% on carotid doppler 2014; [place] (01/2013): R 1-39%, L 60-79%  . Rotator cuff injury    chronic rotator cuff injury status post repair  . Seizure disorder ([place])   . Stroke [facility])    a. 01/2013=> post cardiac cath CVA to L post communicating artery system; R  sided weakness  . Syncope    Past Surgical History:  Procedure Laterality Date  . COLONOSCOPY    . CORONARY ANGIOPLASTY WITH STENT PLACEMENT  07/14/2002   Stent to the right coronary artery  . ENDOLYMPHATIC SHUNT DECOMPRESSION  06/11/2009    Right endolymphatic sac decompression and shunt  placement  . HERNIA REPAIR  04/10/2008   scrotal hernia repair  . LEFT HEART CATHETERIZATION WITH CORONARY ANGIOGRAM N/A 01/30/2013   Procedure: LEFT HEART CATHETERIZATION WITH CORONARY ANGIOGRAM;  Surgeon: Larey Dresser, MD;  Location: [facility] CATH LAB;  Service: Cardiovascular;  Laterality: N/A;  . ROTATOR CUFF REPAIR     for chronic rotator cuff injury   Current Outpatient Medications on File Prior to Visit  Medication Sig Dispense Refill  . acetaminophen (TYLENOL) 325 MG tablet Take 1-2 tablets (325-650 mg total) by mouth every 4 (four) hours as needed for mild pain.    Marland Kitchen albuterol (PROVENTIL HFA;VENTOLIN HFA) 108 (90 Base) MCG/ACT inhaler Inhale 2 puffs into the lungs every 4 (four) hours as needed for wheezing or shortness of breath. 1 Inhaler 0  . atorvastatin (LIPITOR) 80 MG tablet TAKE 1 TABLET BY MOUTH EVERY DAY 6PM 90 tablet 1  . carbamazepine (CARBATROL) 300 MG 12 hr capsule TAKE 1 CAPSULE BY MOUTH TWICE A DAY 180 capsule 3  .  cloNIDine (CATAPRES) 0.2 MG tablet TAKE 1 TABLET BY MOUTH 2 TIMES DAILY 180 tablet 3  . escitalopram (LEXAPRO) 5 MG tablet TAKE 1 TABLET BY MOUTH EVERYDAY AT BEDTIME 90 tablet 1  . furosemide (LASIX) 20 MG tablet TAKE 1/2 TABLET BY MOUTH DAILY 45 tablet 2  . glucose blood test strip 1 each by Other route as needed. Use as instructed 100 each 3  . glucose monitoring kit (FREESTYLE) monitoring kit 1 each by Does not apply route as needed. 1 each 0  . isosorbide mononitrate (IMDUR) 60 MG 24 hr tablet Take 1 tablet (60 mg total) by mouth daily. 90 tablet 0  . Lancets (ACCU-CHEK SAFE-T PRO) lancets 1 each by Other route as needed. Use as instructed 100 each 3  .  levETIRAcetam (KEPPRA) 250 MG tablet Take 1 tablet (250 mg total) by mouth 2 (two) times daily. 180 tablet 1  . levothyroxine (SYNTHROID) 75 MCG tablet TAKE 1 TABLET BY MOUTH EVERY DAY 90 tablet 3  . losartan (COZAAR) 100 MG tablet Take 1 tablet (100 mg total) by mouth daily. 90 tablet 3  . metFORMIN (GLUCOPHAGE) 500 MG tablet TAKE 1 TABLET BY MOUTH TWICE A DAY WITH MEALS 180 tablet 2  . Multiple Vitamins-Minerals (MULTIVITAMINS THER. W/MINERALS) TABS Take 1 tablet by mouth at bedtime.      Marland Kitchen warfarin (COUMADIN) 7.5 MG tablet TAKE 1&1/2 TABS TUESDAY, THURSDAY & SATURDAY*1 TAB ON SUNDAY, MONDAY, WEDNESDAY & FRIDAY WITH SUPPER (Patient taking differently: TAKE 1&1/2 TABS TUESDAY, THURSDAY and 1 TAB ON Aldona Lento, WEDNESDAY & Friday, Sat WITH SUPPER) 240 tablet 1   No current facility-administered medications on file prior to visit.   Allergies  Allergen Reactions  . Penicillins     Unknown reaction   Social History   Socioeconomic History  . Marital status: Married    Spouse name: Not on file  . Number of children: 4  . Years of education: 12th  . Highest education level: Not on file  Occupational History  . Occupation: Retired  Tobacco Use  . Smoking status: Former Smoker    Years: 5.00    Types: Cigarettes    Quit date: 08/19/1956    Years since quitting: 63.1  . Smokeless tobacco: Former Systems developer    Types: Secondary school teacher  . Vaping Use: Never used  Substance and Sexual Activity  . Alcohol use: No  . Drug use: No  . Sexual activity: Never  Other Topics Concern  . Not on file  Social History Narrative   Lives with wife.   Social Determinants of Health   Financial Resource Strain:   . Difficulty of Paying Living Expenses:   Food Insecurity:   . Worried About Charity fundraiser in the Last Year:   . Arboriculturist in the Last Year:   Transportation Needs:   . Film/video editor (Medical):   Marland Kitchen Lack of Transportation (Non-Medical):   Physical Activity:   . Days  of Exercise per Week:   . Minutes of Exercise per Session:   Stress:   . Feeling of Stress :   Social Connections:   . Frequency of Communication with Friends and Family:   . Frequency of Social Gatherings with Friends and Family:   . Attends Religious Services:   . Active Member of Clubs or Organizations:   . Attends Archivist Meetings:   Marland Kitchen Marital Status:   Intimate Partner Violence:   . Fear of Current or  Ex-Partner:   . Emotionally Abused:   Marland Kitchen Physically Abused:   . Sexually Abused:      Review of Systems  All other systems reviewed and are negative.      Objective:   Physical Exam Vitals reviewed.  Constitutional:      General: He is not in acute distress.    Appearance: Normal appearance. He is obese. He is not ill-appearing, toxic-appearing or diaphoretic.  Cardiovascular:     Rate and Rhythm: Normal rate. Rhythm irregular.  Pulmonary:     Effort: Pulmonary effort is normal. No respiratory distress.     Breath sounds: Normal breath sounds. No wheezing, rhonchi or rales.  Musculoskeletal:     Right lower leg: No edema.     Left lower leg: No edema.  Neurological:     Mental Status: He is alert.           Assessment & Plan:  Chronic anticoagulation  Paroxysmal atrial fibrillation ([place]) - Plan: PT with INR/Fingerstick  INR therapeutic. Recheck in 6 weeks.

## 2019-10-16 ENCOUNTER — Other Ambulatory Visit: Payer: Self-pay | Admitting: Family Medicine

## 2019-10-26 ENCOUNTER — Other Ambulatory Visit: Payer: Self-pay | Admitting: Neurology

## 2019-11-14 ENCOUNTER — Other Ambulatory Visit: Payer: Self-pay | Admitting: Physician Assistant

## 2019-11-20 ENCOUNTER — Telehealth: Payer: Self-pay | Admitting: *Deleted

## 2019-11-20 NOTE — Telephone Encounter (Signed)
A message was left, re: his follow up visit. 

## 2019-11-25 ENCOUNTER — Other Ambulatory Visit: Payer: Self-pay | Admitting: Physician Assistant

## 2019-12-03 ENCOUNTER — Other Ambulatory Visit: Payer: Self-pay

## 2019-12-03 MED ORDER — LEVOTHYROXINE SODIUM 75 MCG PO TABS: 75.0000 ug | ORAL_TABLET | Freq: Every day | ORAL | 0 refills | Status: DC

## 2019-12-03 MED ORDER — CARBAMAZEPINE ER 300 MG PO CP12: 300.0000 mg | ORAL_CAPSULE | Freq: Two times a day (BID) | ORAL | 0 refills | Status: DC

## 2019-12-03 MED ORDER — CLONIDINE HCL 0.2 MG PO TABS: 0.2000 mg | ORAL_TABLET | Freq: Two times a day (BID) | ORAL | 0 refills | Status: DC

## 2019-12-03 MED ORDER — ATORVASTATIN CALCIUM 80 MG PO TABS: ORAL_TABLET | ORAL | 0 refills | Status: DC

## 2019-12-05 ENCOUNTER — Telehealth: Payer: Self-pay | Admitting: Neurology

## 2019-12-05 NOTE — Telephone Encounter (Signed)
Pharmacy has been switched over

## 2019-12-05 NOTE — Telephone Encounter (Signed)
Pt's daughter, York Ram called updating pharmacy to [facility] [address], [place] for prescription for levETIRAcetam (KEPPRA) 250 MG tablet

## 2019-12-27 ENCOUNTER — Other Ambulatory Visit: Payer: Self-pay | Admitting: Family Medicine

## 2019-12-27 DIAGNOSIS — E118 Type 2 diabetes mellitus with unspecified complications: Secondary | ICD-10-CM

## 2019-12-27 DIAGNOSIS — J209 Acute bronchitis, unspecified: Secondary | ICD-10-CM

## 2019-12-27 MED ORDER — ESCITALOPRAM OXALATE 5 MG PO TABS: ORAL_TABLET | ORAL | 1 refills | Status: DC

## 2019-12-27 MED ORDER — ATORVASTATIN CALCIUM 80 MG PO TABS: ORAL_TABLET | ORAL | 0 refills | Status: DC

## 2019-12-27 MED ORDER — LOSARTAN POTASSIUM 100 MG PO TABS
100.0000 mg | ORAL_TABLET | Freq: Every day | ORAL | 1 refills | Status: DC
Start: 2019-12-27 — End: 2020-07-01

## 2019-12-27 MED ORDER — LEVOTHYROXINE SODIUM 75 MCG PO TABS: 75.0000 ug | ORAL_TABLET | Freq: Every day | ORAL | 0 refills | Status: DC

## 2019-12-27 MED ORDER — LEVETIRACETAM 250 MG PO TABS
250.0000 mg | ORAL_TABLET | Freq: Two times a day (BID) | ORAL | 1 refills | Status: DC
Start: 2019-12-27 — End: 2020-08-20

## 2019-12-27 MED ORDER — ISOSORBIDE MONONITRATE ER 60 MG PO TB24
60.0000 mg | ORAL_TABLET | Freq: Every day | ORAL | 3 refills | Status: DC
Start: 2019-12-27 — End: 2020-07-01

## 2019-12-27 MED ORDER — FUROSEMIDE 20 MG PO TABS: 10.0000 mg | ORAL_TABLET | Freq: Every day | ORAL | 2 refills | Status: DC

## 2019-12-27 MED ORDER — ALBUTEROL SULFATE HFA 108 (90 BASE) MCG/ACT IN AERS: 2.0000 | INHALATION_SPRAY | RESPIRATORY_TRACT | 11 refills | Status: DC | PRN

## 2019-12-27 MED ORDER — CARBAMAZEPINE ER 300 MG PO CP12: 300.0000 mg | ORAL_CAPSULE | Freq: Two times a day (BID) | ORAL | 0 refills | Status: DC

## 2019-12-27 MED ORDER — CLONIDINE HCL 0.2 MG PO TABS: 0.2000 mg | ORAL_TABLET | Freq: Two times a day (BID) | ORAL | 0 refills | Status: DC

## 2019-12-27 MED ORDER — METFORMIN HCL 500 MG PO TABS
500.0000 mg | ORAL_TABLET | Freq: Two times a day (BID) | ORAL | 2 refills | Status: DC
Start: 2019-12-27 — End: 2020-04-15

## 2019-12-27 MED ORDER — WARFARIN SODIUM 7.5 MG PO TABS: ORAL_TABLET | ORAL | 3 refills | Status: DC

## 2019-12-27 NOTE — Telephone Encounter (Signed)
Kim daughter of patient is requesting a refill on ALL medications sent to [facility] on [address] In [place], he is going to be out of some today but they have recently switched pharmacy and they are requesting all medications to be sent over.  Please call Maudie Mercury once this has been done.   CB# [phone]

## 2019-12-27 NOTE — Telephone Encounter (Signed)
Prescription sent to pharmacy.

## 2019-12-28 ENCOUNTER — Other Ambulatory Visit: Payer: Self-pay | Admitting: Family Medicine

## 2019-12-31 ENCOUNTER — Telehealth: Payer: Self-pay | Admitting: Family Medicine

## 2019-12-31 NOTE — Progress Notes (Signed)
  Chronic Care Management   Note  12/31/2019 Name: [name] MRN: [id number] DOB: 01-21-1934  [name] is a 86 y.o. year old male who is a primary care patient of Pickard, Cammie Mcgee, MD. I reached out to Huntley Dec Solar by phone today in response to a referral sent by [name]'s PCP, [name], MD.   [name] was given information about Chronic Care Management services today including:  1. CCM service includes personalized support from designated clinical staff supervised by his physician, including individualized plan of care and coordination with other care providers 2. 24/7 contact phone numbers for assistance for urgent and routine care needs. 3. Service will only be billed when office clinical staff spend 20 minutes or more in a month to coordinate care. 4. Only one practitioner may furnish and bill the service in a calendar month. 5. The patient may stop CCM services at any time (effective at the end of the month) by phone call to the office staff.   Patient wishes to consider information provided and/or speak with a member of the care team before deciding about enrollment in care management services.   Follow up plan:   Carley Perdue [employer] Scheduler

## 2020-01-16 DIAGNOSIS — I5032 Chronic diastolic (congestive) heart failure: Secondary | ICD-10-CM | POA: Insufficient documentation

## 2020-01-16 NOTE — Progress Notes (Signed)
Cardiology Office Note   Date:  01/17/2020   ID:  [name], DOB 12-08-1933, MRN [id number]  PCP:  [name], MD  Cardiologist:   Minus Breeding, MD   Chief Complaint  Patient presents with  . Knee Pain      History of Present Illness: [name] is a 86 y.o. male who presents for follow up of CAD and CVA following a cath in 2014.  He has also had atrial flutter.  He was admitted with syncope.  He had hydrocephalus.  He had readmission in May 2020 again with syncope.  The etiology was not clear.  He did have atrial fib with short pauses and bradycardia and was taken off of beta blockers.  He has also been treated for diastolic HF.     Since I last saw him he has done okay.  He has no acute cardiac complaints.  He denies any shortness of breath, PND or orthopnea.  He has had no palpitations, presyncope or syncope.  He has had no weight gain or edema.  He walks around with a walker.  He twisted his knee stepping in a hole going out to an out building.    Past Medical History:  Diagnosis Date  . Atrial flutter ([place])   . BPH (benign prostatic hyperplasia)   . Coronary artery disease    a. s/p MI tx with Cypher DES to pRCA in 4/04;  b. Echocardiogram 7/09: Normal LV function.  c. Nuclear study 3/13 no ischemia;  d. ETT 2/14 neg;  e. admx with CP => LHC (01/30/2013):  pLAD 40-50%, oD1 70-80%, oOM1 40%, pRCA stent patent, mid RCA 30%, EF 60-65%. => med Rx.  . DDD (degenerative disc disease)   . Diabetes mellitus without complication ([place])   . Dyslipidemia   . Gait disorder 07/13/2018  . Hemorrhoids   . Hx MRSA infection    left buttocks abscess  . Hx of echocardiogram    a. Echocardiogram (01/31/2013): Mild focal basal and mild concentric hypertrophy of the septum, EF 50-55%, normal wall motion, grade 1 diastolic dysfunction, trivial AI, MAC, mild LAE, PASP 35  . Hyperlipidemia   . Hypertension   . Hypothyroidism   . Meniere's disease    Status post shunt  .  Myocardial infarction ([place]) 2008  . Occlusion and stenosis of carotid artery without mention of cerebral infarction    40-59% on carotid doppler 2014; [place] (01/2013): R 1-39%, L 60-79%  . Rotator cuff injury    chronic rotator cuff injury status post repair  . Seizure disorder ([place])   . Stroke [facility])    a. 01/2013=> post cardiac cath CVA to L post communicating artery system; R sided weakness  . Syncope     Past Surgical History:  Procedure Laterality Date  . COLONOSCOPY    . CORONARY ANGIOPLASTY WITH STENT PLACEMENT  07/14/2002   Stent to the right coronary artery  . ENDOLYMPHATIC SHUNT DECOMPRESSION  06/11/2009    Right endolymphatic sac decompression and shunt  placement  . HERNIA REPAIR  04/10/2008   scrotal hernia repair  . LEFT HEART CATHETERIZATION WITH CORONARY ANGIOGRAM N/A 01/30/2013   Procedure: LEFT HEART CATHETERIZATION WITH CORONARY ANGIOGRAM;  Surgeon: Larey Dresser, MD;  Location: [facility] CATH LAB;  Service: Cardiovascular;  Laterality: N/A;  . ROTATOR CUFF REPAIR     for chronic rotator cuff injury     Current Outpatient Medications  Medication Sig Dispense Refill  . acetaminophen (TYLENOL)  325 MG tablet Take 1-2 tablets (325-650 mg total) by mouth every 4 (four) hours as needed for mild pain.    Marland Kitchen albuterol (VENTOLIN HFA) 108 (90 Base) MCG/ACT inhaler Inhale 2 puffs into the lungs every 4 (four) hours as needed for wheezing or shortness of breath. 18 g 11  . atorvastatin (LIPITOR) 80 MG tablet TAKE 1 TABLET BY MOUTH EVERY DAY 6PM 90 tablet 0  . carbamazepine (CARBATROL) 300 MG 12 hr capsule TAKE 1 CAPSULE BY MOUTH TWICE A DAY 180 capsule 3  . cloNIDine (CATAPRES) 0.2 MG tablet Take 1 tablet (0.2 mg total) by mouth 2 (two) times daily. 180 tablet 0  . escitalopram (LEXAPRO) 5 MG tablet TAKE 1 TABLET BY MOUTH EVERYDAY AT BEDTIME 90 tablet 1  . furosemide (LASIX) 20 MG tablet Take 0.5 tablets (10 mg total) by mouth daily. 45 tablet 2  . glucose blood test strip 1 each by  Other route as needed. Use as instructed 100 each 3  . glucose monitoring kit (FREESTYLE) monitoring kit 1 each by Does not apply route as needed. 1 each 0  . isosorbide mononitrate (IMDUR) 60 MG 24 hr tablet Take 1 tablet (60 mg total) by mouth daily. 90 tablet 3  . Lancets (ACCU-CHEK SAFE-T PRO) lancets 1 each by Other route as needed. Use as instructed 100 each 3  . levETIRAcetam (KEPPRA) 250 MG tablet Take 1 tablet (250 mg total) by mouth 2 (two) times daily. 180 tablet 1  . levothyroxine (SYNTHROID) 75 MCG tablet Take 1 tablet (75 mcg total) by mouth daily. 90 tablet 0  . losartan (COZAAR) 100 MG tablet Take 1 tablet (100 mg total) by mouth daily. 90 tablet 1  . metFORMIN (GLUCOPHAGE) 500 MG tablet Take 1 tablet (500 mg total) by mouth 2 (two) times daily with a meal. 180 tablet 2  . Multiple Vitamins-Minerals (MULTIVITAMINS THER. W/MINERALS) TABS Take 1 tablet by mouth at bedtime.      Marland Kitchen warfarin (COUMADIN) 7.5 MG tablet TAKE 1&1/2 TABS TUESDAY, THURSDAY & SATURDAY*1 TAB ON SUNDAY, MONDAY, WEDNESDAY & FRIDAY WITH SUPPER 120 tablet 3   No current facility-administered medications for this visit.    Allergies:   Penicillins     ROS:  Please see the history of present illness.   Otherwise, review of systems are positive for none.   All other systems are reviewed and negative.    PHYSICAL EXAM: VS:  BP (!) 144/80   Pulse 62   Ht 5' 7.5" (1.715 m)   Wt 176 lb 3.2 oz (79.9 kg)   SpO2 98%   BMI 27.19 kg/m  , BMI Body mass index is 27.19 kg/m. GENERAL:  Well appearing NECK:  No jugular venous distention, waveform within normal limits, carotid upstroke brisk and symmetric, no bruits, no thyromegaly LUNGS:  Clear to auscultation bilaterally CHEST:  Unremarkable HEART:  PMI not displaced or sustained,S1 and S2 within normal limits, no S3, no S4, no clicks, no rubs, 3 out of 6 apical systolic murmur slightly radiating at the aortic outflow tract, no diastolic murmurs ABD:  Flat, positive  bowel sounds normal in frequency in pitch, no bruits, no rebound, no guarding, no midline pulsatile mass, no hepatomegaly, no splenomegaly EXT:  2 plus pulses throughout, no edema, no cyanosis no clubbing    EKG:  EKG is not  ordered today.    Recent Labs: 05/03/2019: TSH 4.07 09/18/2019: ALT 21; BUN 29; Creatinine, Ser 1.20; Hemoglobin 10.9; Platelets 260; Potassium 4.3; Sodium 135  Lipid Panel    Component Value Date/Time   CHOL 179 05/30/2018 0440   TRIG 131 05/30/2018 0440   HDL 50 05/30/2018 0440   CHOLHDL 3.6 05/30/2018 0440   VLDL 26 05/30/2018 0440   LDLCALC 103 (H) 05/30/2018 0440      Wt Readings from Last 3 Encounters:  01/17/20 176 lb 3.2 oz (79.9 kg)  10/02/19 179 lb (81.2 kg)  09/18/19 179 lb (81.2 kg)      Other studies Reviewed: Additional studies/ records that were reviewed today include: Labs Review of the above records demonstrates:  Please see elsewhere in the note.     ASSESSMENT AND PLAN:  Atrial flutter with bradycardia:    He has had no further symptomatic arrhythmias.  He tolerates anticoagulation.  He continues on warfarin because of drug interactions with DOAC's he cannot be on these.  No change in therapy.   Hypertension:   Blood pressure is at target.  No change in therapy.   Chronic diastolic heart failure:   His weight is stable.  He seems to be euvolemic.  No change in therapy.   CAD:   The patient has no new sypmtoms.  No further cardiovascular testing is indicated.  We will continue with aggressive risk reduction and meds as listed.  Hypothyroidism:    This is followed by [name], MD.   Hyperlipidemia:    LDL was very slightly elevated at 103 but his HDL is 50.  He tolerates the meds as listed.  No change in therapy.   Bilateral carotid artery disease: Recent carotid Doppler obtained on 05/30/2018 showed 1 to 39% disease bilaterally.  No change in therapy.  No further imaging.      Current medicines are reviewed at  length with the patient today.  The patient does not have concerns regarding medicines.  The following changes have been made:  no change  Labs/ tests ordered today include:  No orders of the defined types were placed in this encounter.    Disposition:   FU with me in one year.     Signed, Minus Breeding, MD  01/17/2020 10:42 AM    [facility]

## 2020-01-17 ENCOUNTER — Other Ambulatory Visit: Payer: Self-pay

## 2020-01-17 ENCOUNTER — Ambulatory Visit (INDEPENDENT_AMBULATORY_CARE_PROVIDER_SITE_OTHER): Payer: Medicare Other | Admitting: Cardiology

## 2020-01-17 ENCOUNTER — Encounter: Payer: Self-pay | Admitting: Cardiology

## 2020-01-17 ENCOUNTER — Ambulatory Visit (INDEPENDENT_AMBULATORY_CARE_PROVIDER_SITE_OTHER): Payer: Medicare Other | Admitting: Family Medicine

## 2020-01-17 ENCOUNTER — Encounter: Payer: Self-pay | Admitting: Family Medicine

## 2020-01-17 VITALS — BP 146/94 | HR 64 | Temp 97.0°F | Resp 18 | Ht 68.0 in | Wt 191.0 lb

## 2020-01-17 VITALS — BP 144/80 | HR 62 | Ht 67.5 in | Wt 176.2 lb

## 2020-01-17 DIAGNOSIS — E039 Hypothyroidism, unspecified: Secondary | ICD-10-CM

## 2020-01-17 DIAGNOSIS — I480 Paroxysmal atrial fibrillation: Secondary | ICD-10-CM | POA: Diagnosis not present

## 2020-01-17 DIAGNOSIS — E118 Type 2 diabetes mellitus with unspecified complications: Secondary | ICD-10-CM | POA: Diagnosis not present

## 2020-01-17 DIAGNOSIS — I2510 Atherosclerotic heart disease of native coronary artery without angina pectoris: Secondary | ICD-10-CM

## 2020-01-17 DIAGNOSIS — E785 Hyperlipidemia, unspecified: Secondary | ICD-10-CM | POA: Diagnosis not present

## 2020-01-17 DIAGNOSIS — Z23 Encounter for immunization: Secondary | ICD-10-CM | POA: Diagnosis not present

## 2020-01-17 DIAGNOSIS — I10 Essential (primary) hypertension: Secondary | ICD-10-CM | POA: Diagnosis not present

## 2020-01-17 DIAGNOSIS — Z7901 Long term (current) use of anticoagulants: Secondary | ICD-10-CM

## 2020-01-17 DIAGNOSIS — I5032 Chronic diastolic (congestive) heart failure: Secondary | ICD-10-CM

## 2020-01-17 DIAGNOSIS — I4892 Unspecified atrial flutter: Secondary | ICD-10-CM | POA: Diagnosis not present

## 2020-01-17 LAB — PT WITH INR/FINGERSTICK
INR, fingerstick: 1.6 ratio — ABNORMAL HIGH
PT, fingerstick: 18.8 s — ABNORMAL HIGH (ref 10.5–13.1)

## 2020-01-17 NOTE — Patient Instructions (Signed)
Medication Instructions:  No changes *If you need a refill on your cardiac medications before your next appointment, please call your pharmacy*   Lab Work: None ordered If you have labs (blood work) drawn today and your tests are completely normal, you will receive your results only by: . MyChart Message (if you have MyChart) OR . A paper copy in the mail If you have any lab test that is abnormal or we need to change your treatment, we will call you to review the results.   Testing/Procedures: None ordered   Follow-Up: At [facility], you and your health needs are our priority.  As part of our continuing mission to provide you with exceptional heart care, we have created designated Provider Care Teams.  These Care Teams include your primary Cardiologist (physician) and Advanced Practice Providers (APPs -  Physician Assistants and Nurse Practitioners) who all work together to provide you with the care you need, when you need it.  We recommend signing up for the patient portal called "MyChart".  Sign up information is provided on this After Visit Summary.  MyChart is used to connect with patients for Virtual Visits (Telemedicine).  Patients are able to view lab/test results, encounter notes, upcoming appointments, etc.  Non-urgent messages can be sent to your provider as well.   To learn more about what you can do with MyChart, go to [email or web address].    Your next appointment:   12 month(s)  The format for your next appointment:   In Person  Provider:   James Hochrein, MD   Other Instructions None   

## 2020-01-17 NOTE — Progress Notes (Signed)
Subjective:    Patient ID: [name], male    DOB: Dec 04, 1933, 86 y.o.   MRN: [id number]  Atrial Fibrillation Symptoms include hypertension. Past medical history includes atrial fibrillation.   Patient is here today to recheck his INR.  He is currently on Coumadin tablets.  He takes 1-1/2 tablets on Monday, Wednesday, Friday, and Sunday.  He takes 1 tablet on Tuesday, Thursday, Saturday.  However his INR is subtherapeutic at 1.6.  He admits today that he thinks he has been taking the medication wrong.  He states that he has been arranging his pillbox.  Previously his daughter was helping to manage his medication.  Patient states that he thinks he is reversed his medicines and that he is taking 1 tablet 4 days a week and 1-1/2 tablets 3 days a week.  He denies any bleeding or bruising.  He is overdue to recheck his diabetes.  He denies any polyuria, polydipsia, or blurry vision.  His blood pressure today is slightly elevated 146/94. Past Medical History:  Diagnosis Date  . Atrial flutter ([place])   . BPH (benign prostatic hyperplasia)   . Coronary artery disease    a. s/p MI tx with Cypher DES to pRCA in 4/04;  b. Echocardiogram 7/09: Normal LV function.  c. Nuclear study 3/13 no ischemia;  d. ETT 2/14 neg;  e. admx with CP => LHC (01/30/2013):  pLAD 40-50%, oD1 70-80%, oOM1 40%, pRCA stent patent, mid RCA 30%, EF 60-65%. => med Rx.  . DDD (degenerative disc disease)   . Diabetes mellitus without complication ([place])   . Dyslipidemia   . Gait disorder 07/13/2018  . Hemorrhoids   . Hx MRSA infection    left buttocks abscess  . Hx of echocardiogram    a. Echocardiogram (01/31/2013): Mild focal basal and mild concentric hypertrophy of the septum, EF 50-55%, normal wall motion, grade 1 diastolic dysfunction, trivial AI, MAC, mild LAE, PASP 35  . Hyperlipidemia   . Hypertension   . Hypothyroidism   . Meniere's disease    Status post shunt  . Myocardial infarction ([place]) 2008  . Occlusion and  stenosis of carotid artery without mention of cerebral infarction    40-59% on carotid doppler 2014; [place] (01/2013): R 1-39%, L 60-79%  . Rotator cuff injury    chronic rotator cuff injury status post repair  . Seizure disorder ([place])   . Stroke [facility])    a. 01/2013=> post cardiac cath CVA to L post communicating artery system; R sided weakness  . Syncope    Past Surgical History:  Procedure Laterality Date  . COLONOSCOPY    . CORONARY ANGIOPLASTY WITH STENT PLACEMENT  07/14/2002   Stent to the right coronary artery  . ENDOLYMPHATIC SHUNT DECOMPRESSION  06/11/2009    Right endolymphatic sac decompression and shunt  placement  . HERNIA REPAIR  04/10/2008   scrotal hernia repair  . LEFT HEART CATHETERIZATION WITH CORONARY ANGIOGRAM N/A 01/30/2013   Procedure: LEFT HEART CATHETERIZATION WITH CORONARY ANGIOGRAM;  Surgeon: Larey Dresser, MD;  Location: [facility] CATH LAB;  Service: Cardiovascular;  Laterality: N/A;  . ROTATOR CUFF REPAIR     for chronic rotator cuff injury   Current Outpatient Medications on File Prior to Visit  Medication Sig Dispense Refill  . acetaminophen (TYLENOL) 325 MG tablet Take 1-2 tablets (325-650 mg total) by mouth every 4 (four) hours as needed for mild pain.    Marland Kitchen albuterol (VENTOLIN HFA) 108 (90 Base) MCG/ACT inhaler Inhale 2  puffs into the lungs every 4 (four) hours as needed for wheezing or shortness of breath. 18 g 11  . atorvastatin (LIPITOR) 80 MG tablet TAKE 1 TABLET BY MOUTH EVERY DAY 6PM 90 tablet 0  . carbamazepine (CARBATROL) 300 MG 12 hr capsule TAKE 1 CAPSULE BY MOUTH TWICE A DAY 180 capsule 3  . cloNIDine (CATAPRES) 0.2 MG tablet Take 1 tablet (0.2 mg total) by mouth 2 (two) times daily. 180 tablet 0  . escitalopram (LEXAPRO) 5 MG tablet TAKE 1 TABLET BY MOUTH EVERYDAY AT BEDTIME 90 tablet 1  . furosemide (LASIX) 20 MG tablet Take 0.5 tablets (10 mg total) by mouth daily. 45 tablet 2  . glucose blood test strip 1 each by Other route as needed. Use as  instructed 100 each 3  . glucose monitoring kit (FREESTYLE) monitoring kit 1 each by Does not apply route as needed. 1 each 0  . isosorbide mononitrate (IMDUR) 60 MG 24 hr tablet Take 1 tablet (60 mg total) by mouth daily. 90 tablet 3  . Lancets (ACCU-CHEK SAFE-T PRO) lancets 1 each by Other route as needed. Use as instructed 100 each 3  . levETIRAcetam (KEPPRA) 250 MG tablet Take 1 tablet (250 mg total) by mouth 2 (two) times daily. 180 tablet 1  . levothyroxine (SYNTHROID) 75 MCG tablet Take 1 tablet (75 mcg total) by mouth daily. 90 tablet 0  . losartan (COZAAR) 100 MG tablet Take 1 tablet (100 mg total) by mouth daily. 90 tablet 1  . metFORMIN (GLUCOPHAGE) 500 MG tablet Take 1 tablet (500 mg total) by mouth 2 (two) times daily with a meal. 180 tablet 2  . Multiple Vitamins-Minerals (MULTIVITAMINS THER. W/MINERALS) TABS Take 1 tablet by mouth at bedtime.      Marland Kitchen warfarin (COUMADIN) 7.5 MG tablet TAKE 1&1/2 TABS TUESDAY, THURSDAY & SATURDAY*1 TAB ON SUNDAY, MONDAY, WEDNESDAY & FRIDAY WITH SUPPER 120 tablet 3   No current facility-administered medications on file prior to visit.   Allergies  Allergen Reactions  . Penicillins     Unknown reaction   Social History   Socioeconomic History  . Marital status: Married    Spouse name: Not on file  . Number of children: 4  . Years of education: 12th  . Highest education level: Not on file  Occupational History  . Occupation: Retired  Tobacco Use  . Smoking status: Former Smoker    Years: 5.00    Types: Cigarettes    Quit date: 08/19/1956    Years since quitting: 63.4  . Smokeless tobacco: Former Systems developer    Types: Secondary school teacher  . Vaping Use: Never used  Substance and Sexual Activity  . Alcohol use: No  . Drug use: No  . Sexual activity: Never  Other Topics Concern  . Not on file  Social History Narrative   Lives with wife.   Social Determinants of Health   Financial Resource Strain:   . Difficulty of Paying Living Expenses:  Not on file  Food Insecurity:   . Worried About Charity fundraiser in the Last Year: Not on file  . Ran Out of Food in the Last Year: Not on file  Transportation Needs:   . Lack of Transportation (Medical): Not on file  . Lack of Transportation (Non-Medical): Not on file  Physical Activity:   . Days of Exercise per Week: Not on file  . Minutes of Exercise per Session: Not on file  Stress:   . Feeling of  Stress : Not on file  Social Connections:   . Frequency of Communication with Friends and Family: Not on file  . Frequency of Social Gatherings with Friends and Family: Not on file  . Attends Religious Services: Not on file  . Active Member of Clubs or Organizations: Not on file  . Attends Archivist Meetings: Not on file  . Marital Status: Not on file  Intimate Partner Violence:   . Fear of Current or Ex-Partner: Not on file  . Emotionally Abused: Not on file  . Physically Abused: Not on file  . Sexually Abused: Not on file     Review of Systems  All other systems reviewed and are negative.      Objective:   Physical Exam Vitals reviewed.  Constitutional:      General: He is not in acute distress.    Appearance: Normal appearance. He is obese. He is not ill-appearing, toxic-appearing or diaphoretic.  Cardiovascular:     Rate and Rhythm: Normal rate and regular rhythm.  Pulmonary:     Effort: Pulmonary effort is normal. No respiratory distress.     Breath sounds: Normal breath sounds. No wheezing, rhonchi or rales.  Musculoskeletal:     Right lower leg: No edema.     Left lower leg: No edema.  Neurological:     Mental Status: He is alert.           Assessment & Plan:  Chronic anticoagulation - Plan: Hemoglobin A1c, CBC with Differential/Platelet, COMPLETE METABOLIC PANEL WITH GFR, PT with INR/Fingerstick  Paroxysmal atrial fibrillation (HCC) - Plan: Hemoglobin A1c, CBC with Differential/Platelet, COMPLETE METABOLIC PANEL WITH GFR, PT with  INR/Fingerstick  Controlled type 2 diabetes mellitus with complication, without long-term current use of insulin (HCC) - Plan: Hemoglobin A1c, CBC with Differential/Platelet, COMPLETE METABOLIC PANEL WITH GFR, PT with INR/Fingerstick  Need for immunization against influenza - Plan: Flu Vaccine QUAD High Dose(Fluad)  INR is subtherapeutic.  Given the patient's mild memory loss, I do not feel comfortable with him taking Coumadin.  I believe he would be a better candidate for Xarelto that he could take the same dose every day.  The patient frequently forgets to come in for Coumadin checks and this is the first time that he admits he is taking the wrong medication.  We will try one last time with the Coumadin.  He will resume his previous dose and I will recheck his Coumadin in 2 weeks.  If there is issues at that check, I would strongly recommend switching to Xarelto at that time.  Meanwhile check CMP, A1c, and CBC today to monitor the management of his diabetes.

## 2020-01-18 LAB — COMPLETE METABOLIC PANEL WITH GFR
AG Ratio: 2.3 (calc) (ref 1.0–2.5)
ALT: 16 U/L (ref 9–46)
AST: 23 U/L (ref 10–35)
Albumin: 4.3 g/dL (ref 3.6–5.1)
Alkaline phosphatase (APISO): 66 U/L (ref 35–144)
BUN: 25 mg/dL (ref 7–25)
CO2: 29 mmol/L (ref 20–32)
Calcium: 9.5 mg/dL (ref 8.6–10.3)
Chloride: 102 mmol/L (ref 98–110)
Creat: 1.11 mg/dL (ref 0.70–1.11)
GFR, Est African American: 70 mL/min/{1.73_m2} (ref >=60)
GFR, Est Non African American: 61 mL/min/{1.73_m2} (ref >=60)
Globulin: 1.9 g/dL (calc) (ref 1.9–3.7)
Glucose, Bld: 89 mg/dL (ref 65–99)
Potassium: 5 mmol/L (ref 3.5–5.3)
Sodium: 138 mmol/L (ref 135–146)
Total Bilirubin: 0.4 mg/dL (ref 0.2–1.2)
Total Protein: 6.2 g/dL (ref 6.1–8.1)

## 2020-01-18 LAB — CBC WITH DIFFERENTIAL/PLATELET
Absolute Monocytes: 809 cells/uL (ref 200–950)
Basophils Absolute: 68 cells/uL (ref 0–200)
Basophils Relative: 1 %
Eosinophils Absolute: 496 cells/uL (ref 15–500)
Eosinophils Relative: 7.3 %
HCT: 34 % — ABNORMAL LOW (ref 38.5–50.0)
Hemoglobin: 11.4 g/dL — ABNORMAL LOW (ref 13.2–17.1)
Lymphs Abs: 1843 cells/uL (ref 850–3900)
MCH: 31.8 pg (ref 27.0–33.0)
MCHC: 33.5 g/dL (ref 32.0–36.0)
MCV: 94.7 fL (ref 80.0–100.0)
MPV: 11.1 fL (ref 7.5–12.5)
Monocytes Relative: 11.9 %
Neutro Abs: 3584 cells/uL (ref 1500–7800)
Neutrophils Relative %: 52.7 %
Platelets: 269 10*3/uL (ref 140–400)
RBC: 3.59 10*6/uL — ABNORMAL LOW (ref 4.20–5.80)
RDW: 12.9 % (ref 11.0–15.0)
Total Lymphocyte: 27.1 %
WBC: 6.8 10*3/uL (ref 3.8–10.8)

## 2020-01-18 LAB — HEMOGLOBIN A1C
Hgb A1c MFr Bld: 5.6 % of total Hgb (ref <5.7)
Mean Plasma Glucose: 114 (calc)
eAG (mmol/L): 6.3 (calc)

## 2020-01-23 ENCOUNTER — Telehealth: Payer: Self-pay | Admitting: Family Medicine

## 2020-01-23 NOTE — Progress Notes (Signed)
°  Chronic Care Management   Note  01/23/2020 Name: [name] MRN: [id number] DOB: Nov 07, 1933  [name] is a 86 y.o. year old male who is a primary care patient of Pickard, Cammie Mcgee, MD. I reached out to Huntley Dec Gentles by phone today in response to a referral sent by [name]'s PCP, [name], MD.   [name] was given information about Chronic Care Management services today including:  1. CCM service includes personalized support from designated clinical staff supervised by his physician, including individualized plan of care and coordination with other care providers 2. 24/7 contact phone numbers for assistance for urgent and routine care needs. 3. Service will only be billed when office clinical staff spend 20 minutes or more in a month to coordinate care. 4. Only one practitioner may furnish and bill the service in a calendar month. 5. The patient may stop CCM services at any time (effective at the end of the month) by phone call to the office staff.   Patient wishes to consider information provided and/or speak with a member of the care team before deciding about enrollment in care management services.   Follow up plan:   Carley Perdue [employer] Scheduler

## 2020-01-23 NOTE — Progress Notes (Signed)
Pt sch

## 2020-01-28 ENCOUNTER — Other Ambulatory Visit: Payer: Self-pay

## 2020-01-28 ENCOUNTER — Other Ambulatory Visit: Payer: Medicare Other

## 2020-01-28 DIAGNOSIS — I480 Paroxysmal atrial fibrillation: Secondary | ICD-10-CM

## 2020-01-28 LAB — PT WITH INR/FINGERSTICK
INR, fingerstick: 2.6 ratio — ABNORMAL HIGH
PT, fingerstick: 31.1 s — ABNORMAL HIGH (ref 10.5–13.1)

## 2020-02-01 ENCOUNTER — Other Ambulatory Visit: Payer: Medicare Other

## 2020-02-17 ENCOUNTER — Other Ambulatory Visit: Payer: Self-pay | Admitting: Family Medicine

## 2020-02-25 ENCOUNTER — Other Ambulatory Visit: Payer: Medicare Other

## 2020-02-26 ENCOUNTER — Other Ambulatory Visit: Payer: Medicare Other

## 2020-02-26 ENCOUNTER — Other Ambulatory Visit: Payer: Self-pay

## 2020-02-26 DIAGNOSIS — I480 Paroxysmal atrial fibrillation: Secondary | ICD-10-CM

## 2020-02-26 LAB — PT WITH INR/FINGERSTICK
INR, fingerstick: 1.9 ratio — ABNORMAL HIGH
PT, fingerstick: 22.5 s — ABNORMAL HIGH (ref 10.5–13.1)

## 2020-03-30 ENCOUNTER — Other Ambulatory Visit: Payer: Self-pay | Admitting: Family Medicine

## 2020-04-15 ENCOUNTER — Other Ambulatory Visit: Payer: Self-pay | Admitting: Family Medicine

## 2020-04-15 ENCOUNTER — Telehealth: Payer: Self-pay | Admitting: Family Medicine

## 2020-04-15 MED ORDER — METOPROLOL TARTRATE 100 MG PO TABS: 100.0000 mg | ORAL_TABLET | Freq: Two times a day (BID) | ORAL | 1 refills | Status: DC

## 2020-04-15 NOTE — Addendum Note (Signed)
Addended by: Sheral Flow on: 04/15/2020 04:47 PM   Modules accepted: Orders

## 2020-04-15 NOTE — Telephone Encounter (Signed)
Pt daughter called not sure of what meds dr. Rockey Situ pt not to take anymore. cb #: [phone]

## 2020-04-15 NOTE — Telephone Encounter (Addendum)
Per patient notes on labs, D/C Metformin.   Call placed to patient and patient made aware. Patient states the thought he was supposed to stop Metoprolol. Advised to keep taking Metoprolol, but stop Metformin.   Call placed to patient daughter Maudie Mercury. Verbalized understanding.   Also reports that patient spouse tested positive for COVID on 04/08/2020. States that patient has slight runny nose, but no other Sx.

## 2020-04-15 NOTE — Telephone Encounter (Signed)
He should be tested for covid as I suspect he will have it as well.

## 2020-04-16 NOTE — Telephone Encounter (Signed)
Call placed to patient and patient daughter Maudie Mercury made aware.   Reports that patient is asymptomatic, so they defer testing at this time.

## 2020-04-24 ENCOUNTER — Other Ambulatory Visit: Payer: Medicare Other

## 2020-04-24 ENCOUNTER — Other Ambulatory Visit: Payer: Self-pay

## 2020-04-24 DIAGNOSIS — I480 Paroxysmal atrial fibrillation: Secondary | ICD-10-CM

## 2020-04-24 LAB — PT WITH INR/FINGERSTICK
INR, fingerstick: 2.8 ratio — ABNORMAL HIGH
PT, fingerstick: 33.2 s — ABNORMAL HIGH (ref 10.5–13.1)

## 2020-06-01 ENCOUNTER — Other Ambulatory Visit: Payer: Self-pay | Admitting: Family Medicine

## 2020-06-12 ENCOUNTER — Other Ambulatory Visit: Payer: Self-pay | Admitting: Family Medicine

## 2020-06-16 ENCOUNTER — Other Ambulatory Visit: Payer: Self-pay | Admitting: Family Medicine

## 2020-06-24 ENCOUNTER — Telehealth: Payer: Self-pay | Admitting: Family Medicine

## 2020-06-24 NOTE — Telephone Encounter (Signed)
Patient wants to know which medication the provider recently told him to stop taking. Please advise at [phone].

## 2020-06-24 NOTE — Telephone Encounter (Signed)
Call placed to patient daughter and discussed medications.   Current list sent to daughter Maudie Mercury.

## 2020-06-29 ENCOUNTER — Other Ambulatory Visit: Payer: Self-pay

## 2020-06-29 ENCOUNTER — Encounter (HOSPITAL_COMMUNITY): Payer: Self-pay | Admitting: Emergency Medicine

## 2020-06-29 ENCOUNTER — Observation Stay (HOSPITAL_COMMUNITY)
Admission: EM | Admit: 2020-06-29 | Discharge: 2020-07-01 | Disposition: A | Discharge status: Home / Self Care | Payer: Medicare Other | Attending: Internal Medicine | Admitting: Internal Medicine

## 2020-06-29 ENCOUNTER — Emergency Department (HOSPITAL_COMMUNITY): Payer: Medicare Other

## 2020-06-29 DIAGNOSIS — R29898 Other symptoms and signs involving the musculoskeletal system: Secondary | ICD-10-CM

## 2020-06-29 DIAGNOSIS — M5116 Intervertebral disc disorders with radiculopathy, lumbar region: Principal | ICD-10-CM | POA: Diagnosis present

## 2020-06-29 DIAGNOSIS — E119 Type 2 diabetes mellitus without complications: Secondary | ICD-10-CM | POA: Insufficient documentation

## 2020-06-29 DIAGNOSIS — I5032 Chronic diastolic (congestive) heart failure: Secondary | ICD-10-CM | POA: Diagnosis not present

## 2020-06-29 DIAGNOSIS — Z87891 Personal history of nicotine dependence: Secondary | ICD-10-CM | POA: Diagnosis not present

## 2020-06-29 DIAGNOSIS — M545 Low back pain, unspecified: Secondary | ICD-10-CM | POA: Diagnosis not present

## 2020-06-29 DIAGNOSIS — I2510 Atherosclerotic heart disease of native coronary artery without angina pectoris: Secondary | ICD-10-CM | POA: Insufficient documentation

## 2020-06-29 DIAGNOSIS — E118 Type 2 diabetes mellitus with unspecified complications: Secondary | ICD-10-CM | POA: Diagnosis present

## 2020-06-29 DIAGNOSIS — Z79899 Other long term (current) drug therapy: Secondary | ICD-10-CM | POA: Insufficient documentation

## 2020-06-29 DIAGNOSIS — E785 Hyperlipidemia, unspecified: Secondary | ICD-10-CM | POA: Diagnosis present

## 2020-06-29 DIAGNOSIS — I110 Hypertensive heart disease with heart failure: Secondary | ICD-10-CM | POA: Diagnosis not present

## 2020-06-29 DIAGNOSIS — I480 Paroxysmal atrial fibrillation: Secondary | ICD-10-CM | POA: Diagnosis present

## 2020-06-29 DIAGNOSIS — R9431 Abnormal electrocardiogram [ECG] [EKG]: Secondary | ICD-10-CM | POA: Diagnosis not present

## 2020-06-29 DIAGNOSIS — I4891 Unspecified atrial fibrillation: Secondary | ICD-10-CM | POA: Diagnosis not present

## 2020-06-29 DIAGNOSIS — E039 Hypothyroidism, unspecified: Secondary | ICD-10-CM | POA: Diagnosis not present

## 2020-06-29 DIAGNOSIS — Z20822 Contact with and (suspected) exposure to covid-19: Secondary | ICD-10-CM | POA: Diagnosis not present

## 2020-06-29 DIAGNOSIS — Z7901 Long term (current) use of anticoagulants: Secondary | ICD-10-CM | POA: Diagnosis not present

## 2020-06-29 DIAGNOSIS — M5136 Other intervertebral disc degeneration, lumbar region: Secondary | ICD-10-CM | POA: Diagnosis not present

## 2020-06-29 DIAGNOSIS — R531 Weakness: Secondary | ICD-10-CM | POA: Diagnosis not present

## 2020-06-29 DIAGNOSIS — I639 Cerebral infarction, unspecified: Secondary | ICD-10-CM | POA: Diagnosis not present

## 2020-06-29 DIAGNOSIS — M48061 Spinal stenosis, lumbar region without neurogenic claudication: Secondary | ICD-10-CM | POA: Diagnosis not present

## 2020-06-29 DIAGNOSIS — I10 Essential (primary) hypertension: Secondary | ICD-10-CM | POA: Diagnosis present

## 2020-06-29 LAB — URINALYSIS, ROUTINE W REFLEX MICROSCOPIC
Bilirubin Urine: NEGATIVE
Glucose, UA: NEGATIVE mg/dL
Ketones, ur: NEGATIVE mg/dL
Leukocytes,Ua: NEGATIVE
Nitrite: NEGATIVE
Protein, ur: NEGATIVE mg/dL
Specific Gravity, Urine: 1.01 (ref 1.005–1.030)
pH: 7 (ref 5.0–8.0)

## 2020-06-29 LAB — HEPATIC FUNCTION PANEL
ALT: 21 U/L (ref 0–44)
AST: 23 U/L (ref 15–41)
Albumin: 4 g/dL (ref 3.5–5.0)
Alkaline Phosphatase: 63 U/L (ref 38–126)
Bilirubin, Direct: 0.1 mg/dL (ref 0.0–0.2)
Indirect Bilirubin: 0.8 mg/dL (ref 0.3–0.9)
Total Bilirubin: 0.9 mg/dL (ref 0.3–1.2)
Total Protein: 6.6 g/dL (ref 6.5–8.1)

## 2020-06-29 LAB — BASIC METABOLIC PANEL
Anion gap: 10 (ref 5–15)
BUN: 18 mg/dL (ref 8–23)
CO2: 25 mmol/L (ref 22–32)
Calcium: 8.6 mg/dL — ABNORMAL LOW (ref 8.9–10.3)
Chloride: 97 mmol/L — ABNORMAL LOW (ref 98–111)
Creatinine, Ser: 0.85 mg/dL (ref 0.61–1.24)
GFR, Estimated: >60 mL/min (ref >60)
Glucose, Bld: 108 mg/dL — ABNORMAL HIGH (ref 70–99)
Potassium: 3.9 mmol/L (ref 3.5–5.1)
Sodium: 132 mmol/L — ABNORMAL LOW (ref 135–145)

## 2020-06-29 LAB — CBC WITH DIFFERENTIAL/PLATELET
Abs Immature Granulocytes: 0.04 10*3/uL (ref 0.00–0.07)
Basophils Absolute: 0.1 10*3/uL (ref 0.0–0.1)
Basophils Relative: 1 %
Eosinophils Absolute: 0.2 10*3/uL (ref 0.0–0.5)
Eosinophils Relative: 3 %
HCT: 37.4 % — ABNORMAL LOW (ref 39.0–52.0)
Hemoglobin: 12.4 g/dL — ABNORMAL LOW (ref 13.0–17.0)
Immature Granulocytes: 1 %
Lymphocytes Relative: 25 %
Lymphs Abs: 2 10*3/uL (ref 0.7–4.0)
MCH: 31.5 pg (ref 26.0–34.0)
MCHC: 33.2 g/dL (ref 30.0–36.0)
MCV: 94.9 fL (ref 80.0–100.0)
Monocytes Absolute: 1.1 10*3/uL — ABNORMAL HIGH (ref 0.1–1.0)
Monocytes Relative: 14 %
Neutro Abs: 4.5 10*3/uL (ref 1.7–7.7)
Neutrophils Relative %: 56 %
Platelets: 214 10*3/uL (ref 150–400)
RBC: 3.94 MIL/uL — ABNORMAL LOW (ref 4.22–5.81)
RDW: 15.3 % (ref 11.5–15.5)
WBC: 8 10*3/uL (ref 4.0–10.5)
nRBC: 0 % (ref 0.0–0.2)

## 2020-06-29 LAB — LACTIC ACID, PLASMA
Lactic Acid, Venous: 0.9 mmol/L (ref 0.5–1.9)
Lactic Acid, Venous: 1 mmol/L (ref 0.5–1.9)

## 2020-06-29 LAB — URINALYSIS, MICROSCOPIC (REFLEX): Bacteria, UA: NONE SEEN

## 2020-06-29 LAB — RESP PANEL BY RT-PCR (FLU A&B, COVID) ARPGX2
Influenza A by PCR: NEGATIVE
Influenza B by PCR: NEGATIVE
SARS Coronavirus 2 by RT PCR: NEGATIVE

## 2020-06-29 LAB — TSH: TSH: 2.885 u[IU]/mL (ref 0.350–4.500)

## 2020-06-29 LAB — PROTIME-INR
INR: 1.5 — ABNORMAL HIGH (ref 0.8–1.2)
Prothrombin Time: 17.8 seconds — ABNORMAL HIGH (ref 11.4–15.2)

## 2020-06-29 LAB — MAGNESIUM: Magnesium: 2 mg/dL (ref 1.7–2.4)

## 2020-06-29 LAB — TROPONIN I (HIGH SENSITIVITY)
Troponin I (High Sensitivity): 37 ng/L — ABNORMAL HIGH (ref <18)
Troponin I (High Sensitivity): 34 ng/L — ABNORMAL HIGH (ref <18)

## 2020-06-29 MED ORDER — ACETAMINOPHEN 325 MG PO TABS: 325.0000 mg | ORAL_TABLET | ORAL | Status: DC | PRN

## 2020-06-29 MED ORDER — ALBUTEROL SULFATE HFA 108 (90 BASE) MCG/ACT IN AERS
2.0000 | INHALATION_SPRAY | RESPIRATORY_TRACT | Status: DC | PRN
  Filled 2020-06-29: qty 6.7

## 2020-06-29 MED ORDER — CLONIDINE HCL 0.2 MG PO TABS
0.2000 mg | ORAL_TABLET | Freq: Two times a day (BID) | ORAL | Status: DC
  Administered 2020-06-30 – 2020-07-01 (×4): 0.2 mg via ORAL
  Filled 2020-06-29 (×4): qty 1

## 2020-06-29 MED ORDER — CARBAMAZEPINE ER 100 MG PO CP12
300.0000 mg | ORAL_CAPSULE | Freq: Two times a day (BID) | ORAL | Status: DC
  Filled 2020-06-29 (×2): qty 3

## 2020-06-29 MED ORDER — FUROSEMIDE 20 MG PO TABS
10.0000 mg | ORAL_TABLET | Freq: Every day | ORAL | Status: DC
  Administered 2020-06-30 – 2020-07-01 (×2): 10 mg via ORAL
  Filled 2020-06-29: qty 1
  Filled 2020-06-29: qty 0.5
  Filled 2020-06-29: qty 1

## 2020-06-29 MED ORDER — LEVETIRACETAM 250 MG PO TABS
250.0000 mg | ORAL_TABLET | Freq: Two times a day (BID) | ORAL | Status: DC
  Administered 2020-06-30 – 2020-07-01 (×4): 250 mg via ORAL
  Filled 2020-06-29 (×6): qty 1

## 2020-06-29 MED ORDER — LEVOTHYROXINE SODIUM 75 MCG PO TABS
75.0000 ug | ORAL_TABLET | Freq: Every day | ORAL | Status: DC
  Administered 2020-06-30 – 2020-07-01 (×2): 75 ug via ORAL
  Filled 2020-06-29 (×2): qty 1

## 2020-06-29 MED ORDER — ATORVASTATIN CALCIUM 40 MG PO TABS
40.0000 mg | ORAL_TABLET | Freq: Every day | ORAL | Status: DC
  Administered 2020-06-30 – 2020-07-01 (×2): 40 mg via ORAL
  Filled 2020-06-29 (×2): qty 1

## 2020-06-29 MED ORDER — SODIUM CHLORIDE 0.9 % IV BOLUS
1000.0000 mL | Freq: Once | INTRAVENOUS | Status: AC
  Administered 2020-06-29: 1000 mL via INTRAVENOUS

## 2020-06-29 MED ORDER — LOSARTAN POTASSIUM 50 MG PO TABS
100.0000 mg | ORAL_TABLET | Freq: Every day | ORAL | Status: DC
  Administered 2020-06-30 – 2020-07-01 (×2): 100 mg via ORAL
  Filled 2020-06-29 (×2): qty 2

## 2020-06-29 MED ORDER — ISOSORBIDE MONONITRATE ER 60 MG PO TB24
60.0000 mg | ORAL_TABLET | Freq: Every day | ORAL | Status: DC
  Administered 2020-06-30 – 2020-07-01 (×2): 60 mg via ORAL
  Filled 2020-06-29 (×2): qty 1

## 2020-06-29 MED ORDER — METOPROLOL TARTRATE 100 MG PO TABS
100.0000 mg | ORAL_TABLET | Freq: Two times a day (BID) | ORAL | Status: DC
  Administered 2020-06-30 – 2020-07-01 (×4): 100 mg via ORAL
  Filled 2020-06-29 (×2): qty 1
  Filled 2020-06-29: qty 2
  Filled 2020-06-29: qty 1

## 2020-06-29 MED ORDER — LEVOTHYROXINE SODIUM 50 MCG PO TABS: 75.0000 ug | ORAL_TABLET | Freq: Every day | ORAL | Status: DC

## 2020-06-29 MED ORDER — ESCITALOPRAM OXALATE 10 MG PO TABS
5.0000 mg | ORAL_TABLET | Freq: Every day | ORAL | Status: DC
  Administered 2020-06-30 (×2): 5 mg via ORAL
  Filled 2020-06-29 (×2): qty 1

## 2020-06-29 NOTE — ED Triage Notes (Signed)
Pt c/o weakness for the last week and has been unable to perform his normal ADLs.

## 2020-06-29 NOTE — ED Provider Notes (Signed)
[facility] EMERGENCY DEPARTMENT Provider Note   CSN: [id number] Arrival date & time: 06/29/20  1707     History Chief Complaint  Patient presents with  . Weakness    [name] is a 87 y.o. male w/ hx of A Fib on coumadin,  stroke with residual right hand weakness, MI, presenting to emergency department with generalized weakness.  Patient is here with his daughter.  She reports that he has had bilateral leg weakness since Thursday.  The patient did complain of some low back pain, worse with ambulation.  He said difficulty ambulating, because he feels both his legs are weak.  He denies numbness in his legs.  He does have worsening pain and extends his legs.  His family feels like they have been eating to carry him, normally can walk independently.  He reports that he has urinary incontinence at baseline, perhaps more frequency, and a subjective fever at home yesterday.  He also has a chronic dry cough.  He has no history of spinal surgery.  No recent falls.  HPI     Past Medical History:  Diagnosis Date  . Atrial flutter ([place])   . BPH (benign prostatic hyperplasia)   . Coronary artery disease    a. s/p MI tx with Cypher DES to pRCA in 4/04;  b. Echocardiogram 7/09: Normal LV function.  c. Nuclear study 3/13 no ischemia;  d. ETT 2/14 neg;  e. admx with CP => LHC (01/30/2013):  pLAD 40-50%, oD1 70-80%, oOM1 40%, pRCA stent patent, mid RCA 30%, EF 60-65%. => med Rx.  . DDD (degenerative disc disease)   . Diabetes mellitus without complication ([place])   . Dyslipidemia   . Gait disorder 07/13/2018  . Hemorrhoids   . Hx MRSA infection    left buttocks abscess  . Hx of echocardiogram    a. Echocardiogram (01/31/2013): Mild focal basal and mild concentric hypertrophy of the septum, EF 50-55%, normal wall motion, grade 1 diastolic dysfunction, trivial AI, MAC, mild LAE, PASP 35  . Hyperlipidemia   . Hypertension   . Hypothyroidism   . Meniere's disease    Status post shunt  .  Myocardial infarction ([place]) 2008  . Occlusion and stenosis of carotid artery without mention of cerebral infarction    40-59% on carotid doppler 2014; [place] (01/2013): R 1-39%, L 60-79%  . Rotator cuff injury    chronic rotator cuff injury status post repair  . Seizure disorder ([place])   . Stroke [facility])    a. 01/2013=> post cardiac cath CVA to L post communicating artery system; R sided weakness  . Syncope     Patient Active Problem List   Diagnosis Date Noted  . Chronic diastolic HF (heart failure) ([place]) 01/16/2020  . Acute exacerbation of CHF (congestive heart failure) ([place]) 08/19/2018  . Hypertensive urgency 08/19/2018  . Gait disorder 07/13/2018  . Recurrent syncope 07/13/2018  . E. coli UTI 06/19/2018  . Leucocytosis 06/19/2018  . Normal pressure hydrocephalus ([place]) 06/01/2018  . Altered mental status 05/31/2018  . AMS (altered mental status) 05/30/2018  . Seizure disorder ([place]) 05/30/2018  . Fracture of proximal phalanx of finger fifth 06/17/2017 07/21/2017  . Upper airway cough syndrome 11/01/2016  . Atrial flutter ([place]) 08/05/2016  . Coronary artery disease involving native coronary artery of native heart without angina pectoris 08/05/2016  . Chronic anticoagulation 12/31/2015  . History of seizures 12/31/2015  . Acute diastolic CHF (congestive heart failure) ([place]) 12/24/2015  . Pulmonary edema   .  Paroxysmal atrial fibrillation (H[facility])   . Hypothyroidism   . Controlled type 2 diabetes mellitus with complication, without long-term current use of insulin ([place])   . Hypertensive emergency 12/20/2015  . Special screening for malignant neoplasms, colon 12/07/2013  . Bowel habit changes 12/07/2013  . Diarrhea 03/09/2013  . History of cardioembolic cerebrovascular accident (CVA) 01/31/2013  . Chest pain 01/29/2013  . Bilateral carotid artery disease ([place])   . Loss of coordination 12/03/2012  . Morbid obesity due to excess calories ([place]) 08/20/2011  . CAD S/P percutaneous coronary  angioplasty   . Dyslipidemia   . HTN (hypertension)     Past Surgical History:  Procedure Laterality Date  . COLONOSCOPY    . CORONARY ANGIOPLASTY WITH STENT PLACEMENT  07/14/2002   Stent to the right coronary artery  . ENDOLYMPHATIC SHUNT DECOMPRESSION  06/11/2009    Right endolymphatic sac decompression and shunt  placement  . HERNIA REPAIR  04/10/2008   scrotal hernia repair  . LEFT HEART CATHETERIZATION WITH CORONARY ANGIOGRAM N/A 01/30/2013   Procedure: LEFT HEART CATHETERIZATION WITH CORONARY ANGIOGRAM;  Surgeon: Larey Dresser, MD;  Location: [facility] CATH LAB;  Service: Cardiovascular;  Laterality: N/A;  . ROTATOR CUFF REPAIR     for chronic rotator cuff injury       Family History  Problem Relation Age of Onset  . Heart attack Father 59  . Aneurysm Mother        brain aneurysm  . Coronary artery disease Brother        with CABG  . Diabetes Brother   . Colon cancer Neg Hx   . Colon polyps Neg Hx     Social History   Tobacco Use  . Smoking status: Former Smoker    Years: 5.00    Types: Cigarettes    Quit date: 08/19/1956    Years since quitting: 63.9  . Smokeless tobacco: Former Systems developer    Types: Secondary school teacher  . Vaping Use: Never used  Substance Use Topics  . Alcohol use: No  . Drug use: No    Home Medications Prior to Admission medications   Medication Sig Start Date End Date Taking? Authorizing Provider  acetaminophen (TYLENOL) 325 MG tablet Take 1-2 tablets (325-650 mg total) by mouth every 4 (four) hours as needed for mild pain. 06/15/18   Love, Ivan Anchors, PA-C  albuterol (VENTOLIN HFA) 108 (90 Base) MCG/ACT inhaler Inhale 2 puffs into the lungs every 4 (four) hours as needed for wheezing or shortness of breath. 12/27/19   [name], MD  amoxicillin (AMOXIL) 500 MG capsule Take 500 mg by mouth 4 (four) times daily. 06/24/20   [provider]  atorvastatin (LIPITOR) 80 MG tablet TAKE 1 TABLET BY MOUTH EVERY DAY 6PM 02/18/20   [name], MD  carbamazepine (CARBATROL) 300 MG 12 hr capsule TAKE 1 CAPSULE BY MOUTH TWICE A DAY 12/28/19   [name], MD  cloNIDine (CATAPRES) 0.2 MG tablet TAKE 1 TABLET(0.2 MG) BY MOUTH TWICE DAILY 06/17/20   [name], MD  escitalopram (LEXAPRO) 5 MG tablet TAKE 1 TABLET BY MOUTH EVERYDAY AT BEDTIME 12/27/19   [name], MD  furosemide (LASIX) 20 MG tablet Take 0.5 tablets (10 mg total) by mouth daily. 12/27/19   [name], MD  glucose blood test strip 1 each by Other route as needed. Use as instructed 08/25/18   Alycia Rossetti, MD  glucose monitoring kit (FREESTYLE) monitoring kit 1  each by Does not apply route as needed. 08/25/18   Alycia Rossetti, MD  isosorbide mononitrate (IMDUR) 60 MG 24 hr tablet Take 1 tablet (60 mg total) by mouth daily. 12/27/19   [name], MD  Lancets (ACCU-CHEK SAFE-T PRO) lancets 1 each by Other route as needed. Use as instructed 08/25/18   Alycia Rossetti, MD  levETIRAcetam (KEPPRA) 250 MG tablet Take 1 tablet (250 mg total) by mouth 2 (two) times daily. 12/27/19   [name], MD  levothyroxine (SYNTHROID) 75 MCG tablet TAKE 1 TABLET(75 MCG) BY MOUTH DAILY 06/13/20   [name], MD  losartan (COZAAR) 100 MG tablet Take 1 tablet (100 mg total) by mouth daily. 12/27/19   [name], MD  metoprolol tartrate (LOPRESSOR) 100 MG tablet TAKE 1 TABLET(100 MG) BY MOUTH TWICE DAILY 04/15/20   [name], MD  Multiple Vitamins-Minerals (MULTIVITAMINS THER. W/MINERALS) TABS Take 1 tablet by mouth at bedtime.      [provider]  warfarin (COUMADIN) 7.5 MG tablet TAKE 1&1/2 TABS TUESDAY, THURSDAY & SATURDAY*1 TAB ON SUNDAY, MONDAY, WEDNESDAY & FRIDAY WITH SUPPER 12/27/19   [name], MD    Allergies    Penicillins  Review of Systems   Review of Systems  Constitutional: Positive for appetite change, chills, fatigue and fever.  Eyes: Negative for pain and visual disturbance.  Respiratory: Positive for  cough. Negative for shortness of breath.   Cardiovascular: Negative for chest pain and palpitations.  Gastrointestinal: Negative for abdominal pain and vomiting.  Genitourinary: Negative for dysuria and hematuria.  Musculoskeletal: Positive for arthralgias, back pain and myalgias.  Skin: Negative for color change and rash.  Neurological: Positive for headaches. Negative for syncope and light-headedness.  All other systems reviewed and are negative.   Physical Exam Updated Vital Signs BP (!) 175/84   Pulse 66   Temp 99.2 F (37.3 C) (Oral)   Resp (!) 21   Ht _0  (1.727 m)   Wt 78.5 kg   SpO2 95%   BMI 26.30 kg/m   Physical Exam Constitutional:      General: He is not in acute distress. HENT:     Head: Normocephalic and atraumatic.  Eyes:     Conjunctiva/sclera: Conjunctivae normal.     Pupils: Pupils are equal, round, and reactive to light.  Cardiovascular:     Rate and Rhythm: Normal rate. Rhythm irregular.     Pulses: Normal pulses.  Pulmonary:     Effort: Pulmonary effort is normal. No respiratory distress.  Abdominal:     General: There is no distension.     Tenderness: There is no abdominal tenderness.  Skin:    General: Skin is warm and dry.  Neurological:     General: No focal deficit present.     Mental Status: He is alert and oriented to person, place, and time. Mental status is at baseline.     Comments: 4/5 strength right hip flexion and ankle dorsiflexion, 5/5 strength left lower extremity 4/5 grip strength right sided (Chronic per patient), normal left upper extremity strength No saddle anesthesia No spinal midline tenderness Positive straight leg test on the right Unable to bear full weight  Psychiatric:        Mood and Affect: Mood normal.        Behavior: Behavior normal.     ED Results / Procedures / Treatments   Labs (all labs ordered are listed, but only abnormal results are displayed)  Labs Reviewed  BASIC METABOLIC PANEL - Abnormal;  Notable for the following components:      Result Value   Sodium 132 (*)    Chloride 97 (*)    Glucose, Bld 108 (*)    Calcium 8.6 (*)    All other components within normal limits  CBC WITH DIFFERENTIAL/PLATELET - Abnormal; Notable for the following components:   RBC 3.94 (*)    Hemoglobin 12.4 (*)    HCT 37.4 (*)    Monocytes Absolute 1.1 (*)    All other components within normal limits  URINALYSIS, ROUTINE W REFLEX MICROSCOPIC - Abnormal; Notable for the following components:   Hgb urine dipstick SMALL (*)    All other components within normal limits  PROTIME-INR - Abnormal; Notable for the following components:   Prothrombin Time 17.8 (*)    INR 1.5 (*)    All other components within normal limits  TROPONIN I (HIGH SENSITIVITY) - Abnormal; Notable for the following components:   Troponin I (High Sensitivity) 37 (*)    All other components within normal limits  TROPONIN I (HIGH SENSITIVITY) - Abnormal; Notable for the following components:   Troponin I (High Sensitivity) 34 (*)    All other components within normal limits  RESP PANEL BY RT-PCR (FLU A&B, COVID) ARPGX2  CULTURE, BLOOD (SINGLE)  TSH  MAGNESIUM  HEPATIC FUNCTION PANEL  LACTIC ACID, PLASMA  LACTIC ACID, PLASMA  URINALYSIS, MICROSCOPIC (REFLEX)    EKG EKG Interpretation  Date/Time:  Sunday June 29 2020 19:19:57 EDT Ventricular Rate:  73 PR Interval:  189 QRS Duration: 139 QT Interval:  410 QTC Calculation: 452 R Axis:   -27 Text Interpretation: Sinus rhythm LVH with IVCD and secondary repol abnrm Anterior ST elevation, probably due to LVH No significant changes from prior tracing Confirmed by Octaviano Glow [phone]) on 06/29/2020 11:45:08 PM   Radiology DG Chest 2 View  Result Date: 06/29/2020 CLINICAL DATA:  Weakness EXAM: CHEST - 2 VIEW COMPARISON:  08/19/2018, 09/06/2016 FINDINGS: No focal opacity or pleural effusion. Stable cardiomediastinal silhouette with aortic atherosclerosis. No pneumothorax.  IMPRESSION: No active cardiopulmonary disease. Electronically Signed   By: Donavan Foil M.D.   On: 06/29/2020 20:58   CT Head Wo Contrast  Result Date: 06/29/2020 CLINICAL DATA:  Mental status change.  Weakness EXAM: CT HEAD WITHOUT CONTRAST TECHNIQUE: Contiguous axial images were obtained from the base of the skull through the vertex without intravenous contrast. COMPARISON:  09/18/2019 FINDINGS: Brain: There is no mass, hemorrhage or extra-axial collection. There is generalized atrophy without lobar predilection. Hypodensity of the white matter is most commonly associated with chronic microvascular disease. Old small vessel infarcts of the left thalamus and right centrum semiovale. Vascular: Atherosclerotic calcification of the vertebral and internal carotid arteries at the skull base. No abnormal hyperdensity of the major intracranial arteries or dural venous sinuses. Skull: The visualized skull base, calvarium and extracranial soft tissues are normal. Sinuses/Orbits: No fluid levels or advanced mucosal thickening of the visualized paranasal sinuses. No mastoid or middle ear effusion. The orbits are normal. IMPRESSION: 1. No acute intracranial abnormality. 2. Generalized atrophy and chronic microvascular ischemia. 3. Old small vessel infarcts of the left thalamus and right centrum semiovale. Electronically Signed   By: Ulyses Jarred M.D.   On: 06/29/2020 21:50   CT Lumbar Spine Wo Contrast  Result Date: 06/29/2020 CLINICAL DATA:  Bilateral leg weakness with low back pain EXAM: CT LUMBAR SPINE WITHOUT CONTRAST TECHNIQUE: Multidetector CT imaging of the lumbar spine  was performed without intravenous contrast administration. Multiplanar CT image reconstructions were also generated. COMPARISON:  None. FINDINGS: Segmentation: Normal Alignment: Grade 1 retrolisthesis at L3-4 Vertebrae: No acute fracture or focal pathologic process. Paraspinal and other soft tissues: Calcific aortic atherosclerosis. Left  nephrolithiasis. Disc levels: L1-2: Disc space narrowing without herniation. No spinal canal stenosis. Facet hypertrophy with mild bilateral foraminal stenosis. L2-3: Disc space narrowing with endplate spurring. Mild facet hypertrophy. Mild spinal canal stenosis. Severe right and moderate left foraminal stenosis. L3-4: Right asymmetric disc bulge with endplate spurring and mild facet hypertrophy. No spinal canal stenosis. Mild bilateral foraminal stenosis. L4-5: Right asymmetric disc bulge with endplate osteophytes. Moderate facet hypertrophy. Mild spinal canal stenosis. Severe right and moderate left foraminal stenosis. L5-S1: Left asymmetric disc bulge with endplate spurring. Facet hypertrophy. No spinal canal stenosis. Moderate right and severe left neural foraminal stenosis. IMPRESSION: 1. No acute fracture or static subluxation of the lumbar spine. 2. Mild spinal canal stenosis at L2-3 and L4-5. 3. Multilevel moderate-to-severe neural foraminal stenosis. Aortic Atherosclerosis (ICD10-I70.0). Electronically Signed   By: Ulyses Jarred M.D.   On: 06/29/2020 21:55    Procedures Procedures   Medications Ordered in ED Medications  acetaminophen (TYLENOL) tablet 325-650 mg (has no administration in time range)  albuterol (VENTOLIN HFA) 108 (90 Base) MCG/ACT inhaler 2 puff (has no administration in time range)  atorvastatin (LIPITOR) tablet 40 mg (has no administration in time range)  Carbamazepine (EQUETRO) 12 hr capsule 300 mg (has no administration in time range)  cloNIDine (CATAPRES) tablet 0.2 mg (has no administration in time range)  escitalopram (LEXAPRO) tablet 5 mg (has no administration in time range)  furosemide (LASIX) tablet 10 mg (has no administration in time range)  isosorbide mononitrate (IMDUR) 24 hr tablet 60 mg (has no administration in time range)  levETIRAcetam (KEPPRA) tablet 250 mg (has no administration in time range)  losartan (COZAAR) tablet 100 mg (has no administration in  time range)  metoprolol tartrate (LOPRESSOR) tablet 100 mg (has no administration in time range)  levothyroxine (SYNTHROID) tablet 75 mcg (has no administration in time range)  sodium chloride 0.9 % bolus 1,000 mL (1,000 mLs Intravenous New Bag/Given 06/29/20 2112)    ED Course  I have reviewed the triage vital signs and the nursing notes.  Pertinent labs & imaging results that were available during my care of the patient were reviewed by me and considered in my medical decision making (see chart for details).  Patient presents in the company of his family with concern for abrupt worsening weakness of the lower extremities, inability to walk due to leg weakness for the past 2 days.  He also reports some low back discomfort.  He has a positive straight leg test on exam.  This raises possible concern for disc herniation or spinal cord injury resulting in his lower extremity weakness.  There is  also possibility of a subacute stroke as he does appear to have mildly worse right-sided weakness in last.  He has a history of prior stroke with right hand weakness.  Differential diagnoses her weakness could also include an infection, atypical ACS, or general deconditioning.  Labs show troponin low but flat at 34.  Lactate is normal.  White blood cell count is normal.  BMP does not show signs of significant dehydration.  UA without evidence of infection.  Thyroid study normal. Covid/flu negative.  No evident source for sepsis or bactermia No signs or symptoms of meningitis or CNS infection at this time  I ordered medication home meds for BP, other I ordered imaging studies which included DG chest, CTH, CT L spine - which showed no evidence of infection, CVA, or acute lumbar fx, but significant multilevel foraminal narrowing noted Additional history was obtained from patient's daughter at bedside   Patient does not have dementia and can answer questions appropriately, but sometimes confuses details.   For any clarifying questions contact daughter York Ram at [phone].  Clinical Course as of 06/30/20 0009  Sun Jun 29, 2020  2316 Work-up thus far is largely unremarkable.  I spoken the patient and his daughter about a transfer diagnosis count for an MRI.  Primarily would want an MRI of the lumbar spine to evaluate for cord compression, or disc herniation that may be causing this weakness.  I also think with his prior history of stroke and right-sided weakness, an MRI of the brain to evaluate for new infarct may be reasonable, given that he does appear somewhat weaker in the right leg than the left [MT]  2317 I have a lower concern for sepsis, WBC normal, lactate normal.  Afebrile here. [MT]  2317 I spoke to Dr Sedonia Small EDP at [facility] who accepted patient for transfer. [MT]    Clinical Course User Index [MT] Cadance Raus, Carola Rhine, MD    Final Clinical Impression(s) / ED Diagnoses Final diagnoses:  Weakness of both lower extremities  Acute midline low back pain without sciatica    Rx / DC Orders ED Discharge Orders    None       Rontrell Moquin, Carola Rhine, MD 06/30/20 0010

## 2020-06-29 NOTE — ED Notes (Signed)
Pt and daughter report progressively worsening bilateral leg weakness since Thursday. Pt has been on a trip away from home with family since Thursday. Pt co lower back pain with hx of same but denies weakness with previous back pain.

## 2020-06-30 ENCOUNTER — Encounter (HOSPITAL_COMMUNITY): Payer: Self-pay

## 2020-06-30 ENCOUNTER — Emergency Department (HOSPITAL_COMMUNITY): Payer: Medicare Other

## 2020-06-30 DIAGNOSIS — I5032 Chronic diastolic (congestive) heart failure: Secondary | ICD-10-CM | POA: Diagnosis not present

## 2020-06-30 DIAGNOSIS — M48061 Spinal stenosis, lumbar region without neurogenic claudication: Secondary | ICD-10-CM | POA: Diagnosis not present

## 2020-06-30 DIAGNOSIS — I110 Hypertensive heart disease with heart failure: Secondary | ICD-10-CM | POA: Diagnosis not present

## 2020-06-30 DIAGNOSIS — I4891 Unspecified atrial fibrillation: Secondary | ICD-10-CM | POA: Diagnosis not present

## 2020-06-30 DIAGNOSIS — Z20822 Contact with and (suspected) exposure to covid-19: Secondary | ICD-10-CM | POA: Diagnosis not present

## 2020-06-30 DIAGNOSIS — I10 Essential (primary) hypertension: Secondary | ICD-10-CM

## 2020-06-30 DIAGNOSIS — I480 Paroxysmal atrial fibrillation: Secondary | ICD-10-CM | POA: Diagnosis not present

## 2020-06-30 DIAGNOSIS — M5116 Intervertebral disc disorders with radiculopathy, lumbar region: Secondary | ICD-10-CM | POA: Diagnosis not present

## 2020-06-30 DIAGNOSIS — E118 Type 2 diabetes mellitus with unspecified complications: Secondary | ICD-10-CM | POA: Diagnosis not present

## 2020-06-30 DIAGNOSIS — M5136 Other intervertebral disc degeneration, lumbar region: Secondary | ICD-10-CM | POA: Diagnosis not present

## 2020-06-30 DIAGNOSIS — E039 Hypothyroidism, unspecified: Secondary | ICD-10-CM | POA: Diagnosis not present

## 2020-06-30 DIAGNOSIS — I639 Cerebral infarction, unspecified: Secondary | ICD-10-CM | POA: Diagnosis not present

## 2020-06-30 DIAGNOSIS — Z7901 Long term (current) use of anticoagulants: Secondary | ICD-10-CM | POA: Diagnosis not present

## 2020-06-30 DIAGNOSIS — I2510 Atherosclerotic heart disease of native coronary artery without angina pectoris: Secondary | ICD-10-CM | POA: Diagnosis not present

## 2020-06-30 DIAGNOSIS — Z79899 Other long term (current) drug therapy: Secondary | ICD-10-CM | POA: Diagnosis not present

## 2020-06-30 DIAGNOSIS — R29898 Other symptoms and signs involving the musculoskeletal system: Secondary | ICD-10-CM

## 2020-06-30 DIAGNOSIS — R531 Weakness: Secondary | ICD-10-CM | POA: Diagnosis not present

## 2020-06-30 DIAGNOSIS — Z87891 Personal history of nicotine dependence: Secondary | ICD-10-CM | POA: Diagnosis not present

## 2020-06-30 DIAGNOSIS — E119 Type 2 diabetes mellitus without complications: Secondary | ICD-10-CM | POA: Diagnosis not present

## 2020-06-30 LAB — CBC
HCT: 37.3 % — ABNORMAL LOW (ref 39.0–52.0)
Hemoglobin: 12.6 g/dL — ABNORMAL LOW (ref 13.0–17.0)
MCH: 31.4 pg (ref 26.0–34.0)
MCHC: 33.8 g/dL (ref 30.0–36.0)
MCV: 93 fL (ref 80.0–100.0)
Platelets: 212 10*3/uL (ref 150–400)
RBC: 4.01 MIL/uL — ABNORMAL LOW (ref 4.22–5.81)
RDW: 14.9 % (ref 11.5–15.5)
WBC: 7.7 10*3/uL (ref 4.0–10.5)
nRBC: 0 % (ref 0.0–0.2)

## 2020-06-30 LAB — CK: Total CK: 95 U/L (ref 49–397)

## 2020-06-30 LAB — GLUCOSE, CAPILLARY
Glucose-Capillary: 192 mg/dL — ABNORMAL HIGH (ref 70–99)
Glucose-Capillary: 102 mg/dL — ABNORMAL HIGH (ref 70–99)
Glucose-Capillary: 145 mg/dL — ABNORMAL HIGH (ref 70–99)

## 2020-06-30 LAB — HEMOGLOBIN A1C
Hgb A1c MFr Bld: 6.3 % — ABNORMAL HIGH (ref 4.8–5.6)
Mean Plasma Glucose: 134.11 mg/dL

## 2020-06-30 LAB — CBG MONITORING, ED: Glucose-Capillary: 104 mg/dL — ABNORMAL HIGH (ref 70–99)

## 2020-06-30 LAB — SEDIMENTATION RATE: Sed Rate: 9 mm/hr (ref 0–16)

## 2020-06-30 LAB — C-REACTIVE PROTEIN: CRP: 0.7 mg/dL (ref <1.0)

## 2020-06-30 MED ORDER — CARBAMAZEPINE ER 100 MG PO CP12
300.0000 mg | ORAL_CAPSULE | Freq: Two times a day (BID) | ORAL | Status: DC
  Administered 2020-06-30: 300 mg via ORAL
  Filled 2020-06-30 (×2): qty 3

## 2020-06-30 MED ORDER — SODIUM CHLORIDE 0.9% FLUSH: 3.0000 mL | INTRAVENOUS | Status: DC | PRN

## 2020-06-30 MED ORDER — INSULIN ASPART 100 UNIT/ML ~~LOC~~ SOLN
0.0000 [IU] | Freq: Three times a day (TID) | SUBCUTANEOUS | Status: DC
  Administered 2020-06-30 – 2020-07-01 (×2): 3 [IU] via SUBCUTANEOUS

## 2020-06-30 MED ORDER — ACETAMINOPHEN 650 MG RE SUPP: 650.0000 mg | Freq: Four times a day (QID) | RECTAL | Status: DC | PRN

## 2020-06-30 MED ORDER — WARFARIN - PHARMACIST DOSING INPATIENT: Freq: Every day | Status: DC

## 2020-06-30 MED ORDER — WARFARIN SODIUM 7.5 MG PO TABS
7.5000 mg | ORAL_TABLET | Freq: Once | ORAL | Status: AC
  Administered 2020-06-30: 7.5 mg via ORAL
  Filled 2020-06-30: qty 1

## 2020-06-30 MED ORDER — CARBAMAZEPINE ER 100 MG PO TB12
300.0000 mg | ORAL_TABLET | Freq: Two times a day (BID) | ORAL | Status: DC
  Administered 2020-06-30 – 2020-07-01 (×3): 300 mg via ORAL
  Filled 2020-06-30 (×5): qty 3

## 2020-06-30 MED ORDER — WARFARIN SODIUM 7.5 MG PO TABS: 7.5000 mg | ORAL_TABLET | Freq: Once | ORAL | Status: DC

## 2020-06-30 MED ORDER — CELECOXIB 100 MG PO CAPS
100.0000 mg | ORAL_CAPSULE | Freq: Two times a day (BID) | ORAL | Status: DC
  Administered 2020-06-30 – 2020-07-01 (×3): 100 mg via ORAL
  Filled 2020-06-30 (×5): qty 1

## 2020-06-30 MED ORDER — SODIUM CHLORIDE 0.9 % IV SOLN: 250.0000 mL | INTRAVENOUS | Status: DC | PRN

## 2020-06-30 MED ORDER — SODIUM CHLORIDE 0.9% FLUSH
3.0000 mL | Freq: Two times a day (BID) | INTRAVENOUS | Status: DC
  Administered 2020-06-30 – 2020-07-01 (×3): 3 mL via INTRAVENOUS

## 2020-06-30 MED ORDER — TAMSULOSIN HCL 0.4 MG PO CAPS
0.4000 mg | ORAL_CAPSULE | Freq: Every day | ORAL | Status: DC
  Administered 2020-06-30 – 2020-07-01 (×2): 0.4 mg via ORAL
  Filled 2020-06-30 (×3): qty 1

## 2020-06-30 MED ORDER — POLYETHYLENE GLYCOL 3350 17 G PO PACK
17.0000 g | PACK | Freq: Every day | ORAL | Status: DC
  Administered 2020-07-01: 17 g via ORAL
  Filled 2020-06-30: qty 1

## 2020-06-30 MED ORDER — HEPARIN SODIUM (PORCINE) 5000 UNIT/ML IJ SOLN
5000.0000 [IU] | Freq: Three times a day (TID) | INTRAMUSCULAR | Status: DC
  Administered 2020-06-30 – 2020-07-01 (×3): 5000 [IU] via SUBCUTANEOUS
  Filled 2020-06-30 (×3): qty 1

## 2020-06-30 MED ORDER — ACETAMINOPHEN 325 MG PO TABS: 650.0000 mg | ORAL_TABLET | Freq: Four times a day (QID) | ORAL | Status: DC | PRN

## 2020-06-30 NOTE — ED Notes (Signed)
[facility] here to load pt.

## 2020-06-30 NOTE — H&P (Signed)
History and Physical    [name] WCB:[id number] DOB: [birth year] DOA: 06/29/2020  PCP: [name], MD   Patient coming from: home  I have personally briefly reviewed patient's old medical records in [place]  Chief Complaint: leg weakness  HPI: [name] is a 87 y.o. male with medical history significant of of A Fib on coumadin,  stroke with residual right hand weakness, MI, presenting to emergency department with generalized weakness.  Patient is here with his daughter.  She reports that he has had bilateral leg weakness since Thursday.  The patient did complain of some low back pain, worse with ambulation. He does report having fever > 100 degrees F intermittently since last Thursday. He denies dysuria, respiratory symptoms, new wounds or having had any procedures.  He has difficulty ambulating, because he feels both his legs are weak.  He denies numbness in his legs.  He does have worsening pain when he extends his legs.  His family feels like they have been needing to carry him, normally can walk independently. Yet, he does report that he has been ambulating short distances with assistance. He intially presented to [facility] but was transferred to [facility] for MRI imaging.   ED Course: T 100.2  151/72  HR 60  RR 23, Covid negative, U/A negative. EDP exam at [facility] notable for preserved strength LE. CT lumbar spine revealed mild canal stenosis L2-3, L4-5, moderate to severe neural foramina stenosis. CT head w/o acute findings. MRI lumbar spine with inflammatory signal L1-2 disc and medial left psoas without abscess. Also revealed multi-level spinal stenosis. CBCD nl, Cmet nl, TSH 2.005. Patient referred to [facility] for continued evaluation and management Diskitis vs DDD lumbar spine.   Review of Systems: As per HPI otherwise 10 point review of systems negative.    Past Medical History:  Diagnosis Date  . Atrial flutter ([place])   . BPH (benign prostatic  hyperplasia)   . Coronary artery disease    a. s/p MI tx with Cypher DES to pRCA in 4/04;  b. Echocardiogram 7/09: Normal LV function.  c. Nuclear study 3/13 no ischemia;  d. ETT 2/14 neg;  e. admx with CP => LHC (01/30/2013):  pLAD 40-50%, oD1 70-80%, oOM1 40%, pRCA stent patent, mid RCA 30%, EF 60-65%. => med Rx.  . DDD (degenerative disc disease)   . Diabetes mellitus without complication ([place])   . Dyslipidemia   . Gait disorder 07/13/2018  . Hemorrhoids   . Hx MRSA infection    left buttocks abscess  . Hx of echocardiogram    a. Echocardiogram (01/31/2013): Mild focal basal and mild concentric hypertrophy of the septum, EF 50-55%, normal wall motion, grade 1 diastolic dysfunction, trivial AI, MAC, mild LAE, PASP 35  . Hyperlipidemia   . Hypertension   . Hypothyroidism   . Meniere's disease    Status post shunt  . Myocardial infarction ([place]) 2008  . Occlusion and stenosis of carotid artery without mention of cerebral infarction    40-59% on carotid doppler 2014; [place] (01/2013): R 1-39%, L 60-79%  . Rotator cuff injury    chronic rotator cuff injury status post repair  . Seizure disorder ([place])   . Stroke [facility])    a. 01/2013=> post cardiac cath CVA to L post communicating artery system; R sided weakness  . Syncope     Past Surgical History:  Procedure Laterality Date  . COLONOSCOPY    . CORONARY ANGIOPLASTY WITH STENT  PLACEMENT  07/14/2002   Stent to the right coronary artery  . ENDOLYMPHATIC SHUNT DECOMPRESSION  06/11/2009    Right endolymphatic sac decompression and shunt  placement  . HERNIA REPAIR  04/10/2008   scrotal hernia repair  . LEFT HEART CATHETERIZATION WITH CORONARY ANGIOGRAM N/A 01/30/2013   Procedure: LEFT HEART CATHETERIZATION WITH CORONARY ANGIOGRAM;  Surgeon: Larey Dresser, MD;  Location: [facility] CATH LAB;  Service: Cardiovascular;  Laterality: N/A;  . ROTATOR CUFF REPAIR     for chronic rotator cuff injury    Soc Hx - married 28 years, 3 daughters, 1 son,  many grand-children, 5 great-grandchildren. 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 He lives independently with his wife and one du[place]ter[place] is nearby.    reports that he quit smoking about 63 years ago. His smoking use included cigarettes. He quit after 5.00 years of use. He has quit using smokeless tobacco.  His smokeless tobacco use included chew. He reports that he does not drink alcohol and does not use drugs.  Allergies  Allergen Reactions  . Penicillins     Unknown reaction    Family History  Problem Relation Age of Onset  . Heart attack Father 42  . Aneurysm Mother        brain aneurysm  . Coronary artery disease Brother        with CABG  . Diabetes Brother   . Colon cancer Neg Hx   . Colon polyps Neg Hx      Prior to Admission medications   Medication Sig Start Date End Date Taking? Authorizing Provider  acetaminophen (TYLENOL) 325 MG tablet Take 1-2 tablets (325-650 mg total) by mouth every 4 (four) hours as needed for mild pain. 06/15/18   Love, Ivan Anchors, PA-C  albuterol (VENTOLIN HFA) 108 (90 Base) MCG/ACT inhaler Inhale 2 puffs into the lungs every 4 (four) hours as needed for wheezing or shortness of breath. 12/27/19   [name], MD  amoxicillin (AMOXIL) 500 MG capsule Take 500 mg by mouth 4 (four) times daily. 06/24/20   [provider]  atorvastatin (LIPITOR) 80 MG tablet TAKE 1 TABLET BY MOUTH EVERY DAY 6PM 02/18/20   [name], MD  carbamazepine (CARBATROL) 300 MG 12 hr capsule TAKE 1 CAPSULE BY MOUTH TWICE A DAY 12/28/19   [name], MD  cloNIDine (CATAPRES) 0.2 MG tablet TAKE 1 TABLET(0.2 MG) BY MOUTH TWICE DAILY 06/17/20   [name], MD  escitalopram (LEXAPRO) 5 MG tablet TAKE 1 TABLET BY MOUTH EVERYDAY AT BEDTIME 12/27/19   [name], MD  furosemide (LASIX) 20 MG tablet Take 0.5 tablets (10 mg total) by mouth daily. 12/27/19   [name], MD  glucose blood  test strip 1 each by Other route as needed. Use as instructed 08/25/18   Alycia Rossetti, MD  glucose monitoring kit (FREESTYLE) monitoring kit 1 each by Does not apply route as needed. 08/25/18   Alycia Rossetti, MD  isosorbide mononitrate (IMDUR) 60 MG 24 hr tablet Take 1 tablet (60 mg total) by mouth daily. 12/27/19   [name], MD  Lancets (ACCU-CHEK SAFE-T PRO) lancets 1 each by Other route as needed. Use as instructed 08/25/18   Alycia Rossetti, MD  levETIRAcetam (KEPPRA) 250 MG tablet Take 1 tablet (250 mg total) by mouth 2 (two) times daily. 12/27/19   [name], MD  levothyroxine (SYNTHROID) 75 MCG  tablet TAKE 1 TABLET(75 MCG) BY MOUTH DAILY 06/13/20   [name], MD  losartan (COZAAR) 100 MG tablet Take 1 tablet (100 mg total) by mouth daily. 12/27/19   [name], MD  metoprolol tartrate (LOPRESSOR) 100 MG tablet TAKE 1 TABLET(100 MG) BY MOUTH TWICE DAILY 04/15/20   [name], MD  Multiple Vitamins-Minerals (MULTIVITAMINS THER. W/MINERALS) TABS Take 1 tablet by mouth at bedtime.      [provider]  warfarin (COUMADIN) 7.5 MG tablet TAKE 1&1/2 TABS TUESDAY, THURSDAY & SATURDAY*1 TAB ON Aldona Lento, WEDNESDAY & FRIDAY WITH SUPPER 12/27/19   [name], MD    Physical Exam: Vitals:   06/30/20 0132 06/30/20 0145 06/30/20 0238 06/30/20 0446  BP: 127/83 (!) 125/59 (!) 124/55 (!) 151/72  Pulse: 61 (!) 57 (!) 52 60  Resp: (!) _0 (!) 23  Temp: 100.2 F (37.9 C)     TempSrc:      SpO2: 94% 93% 93% 93%  Weight:      Height:         Vitals:   06/30/20 0132 06/30/20 0145 06/30/20 0238 06/30/20 0446  BP: 127/83 (!) 125/59 (!) 124/55 (!) 151/72  Pulse: 61 (!) 57 (!) 52 60  Resp: (!) _1 (!) 23  Temp: 100.2 F (37.9 C)     TempSrc:      SpO2: 94% 93% 93% 93%  Weight:      Height:       General:  WNWD man in no distress. Eyes: PERRL, lids and conjunctivae normal ENMT: Mucous membranes are moist. Posterior pharynx clear  of any exudate or lesions. Native  Dentition with caps.  Neck: normal, supple, no masses, no thyromegaly Respiratory: clear to auscultation bilaterally, no wheezing, no crackles. Normal respiratory effort. No accessory muscle use.  Cardiovascular: Regular rate and rhythm, no murmurs / rubs / gallops. No extremity edema. 2+ pedal pulses. No carotid bruits.  Abdomen: no tenderness, no masses palpated. No hepatosplenomegaly. Bowel sounds positive.  Musculoskeletal: no clubbing / cyanosis. No joint deformity upper and lower extremities. Good ROM, no contractures. Normal muscle tone.  Skin: no rashes, lesions, ulcers. No induration Neurologic: CN 2-12 grossly intact. Sensation intact to light touch UE and LE, DTR normal at biceps, patellar and achilles tendons. . Strength 4/5 right hand, 5/5  in all proximal LE able to lift legs off bed and offer resistance, distal LE.  Psychiatric: Normal judgment and insight. Alert and oriented x 3. Normal mood.     Labs on Admission: I have personally reviewed following labs and imaging studies  CBC: Recent Labs  Lab 06/29/20 2100  WBC 8.0  NEUTROABS 4.5  HGB 12.4*  HCT 37.4*  MCV 94.9  PLT 681   Basic Metabolic Panel: Recent Labs  Lab 06/29/20 2100  NA 132*  K 3.9  CL 97*  CO2 25  GLUCOSE 108*  BUN 18  CREATININE 0.85  CALCIUM 8.6*  MG 2.0   GFR: Estimated Creatinine Clearance: 61.5 mL/min (by C-G formula based on SCr of 0.85 mg/dL). Liver Function Tests: Recent Labs  Lab 06/29/20 2100  AST 23  ALT 21  ALKPHOS 63  BILITOT 0.9  PROT 6.6  ALBUMIN 4.0   No results for input(s): LIPASE, AMYLASE in the last 168 hours. No results for input(s): AMMONIA in the last 168 hours. Coagulation Profile: Recent Labs  Lab 06/29/20 2100  INR 1.5*   Cardiac Enzymes: Recent Labs  Lab 06/30/20 0200  CKTOTAL 95   BNP (last 3 results) No results for input(s): PROBNP in the last 8760 hours. HbA1C: No results for input(s): HGBA1C in the  last 72 hours. CBG: No results for input(s): GLUCAP in the last 168 hours. Lipid Profile: No results for input(s): CHOL, HDL, LDLCALC, TRIG, CHOLHDL, LDLDIRECT in the last 72 hours. Thyroid Function Tests: Recent Labs    06/29/20 2100  TSH 2.885   Anemia Panel: No results for input(s): VITAMINB12, FOLATE, FERRITIN, TIBC, IRON, RETICCTPCT in the last 72 hours. Urine analysis:    Component Value Date/Time   COLORURINE YELLOW 06/29/2020 2132   APPEARANCEUR CLEAR 06/29/2020 2132   LABSPEC 1.010 06/29/2020 2132   PHURINE 7.0 06/29/2020 2132   GLUCOSEU NEGATIVE 06/29/2020 2132   HGBUR SMALL (A) 06/29/2020 2132   BILIRUBINUR NEGATIVE 06/29/2020 2132   KETONESUR NEGATIVE 06/29/2020 2132   PROTEINUR NEGATIVE 06/29/2020 2132   UROBILINOGEN 0.2 10/22/2013 1451   NITRITE NEGATIVE 06/29/2020 2132   LEUKOCYTESUR NEGATIVE 06/29/2020 2132    Radiological Exams on Admission: DG Chest 2 View  Result Date: 06/29/2020 CLINICAL DATA:  Weakness EXAM: CHEST - 2 VIEW COMPARISON:  08/19/2018, 09/06/2016 FINDINGS: No focal opacity or pleural effusion. Stable cardiomediastinal silhouette with aortic atherosclerosis. No pneumothorax. IMPRESSION: No active cardiopulmonary disease. Electronically Signed   By: Donavan Foil M.D.   On: 06/29/2020 20:58   CT Head Wo Contrast  Result Date: 06/29/2020 CLINICAL DATA:  Mental status change.  Weakness EXAM: CT HEAD WITHOUT CONTRAST TECHNIQUE: Contiguous axial images were obtained from the base of the skull through the vertex without intravenous contrast. COMPARISON:  09/18/2019 FINDINGS: Brain: There is no mass, hemorrhage or extra-axial collection. There is generalized atrophy without lobar predilection. Hypodensity of the white matter is most commonly associated with chronic microvascular disease. Old small vessel infarcts of the left thalamus and right centrum semiovale. Vascular: Atherosclerotic calcification of the vertebral and internal carotid arteries at the  skull base. No abnormal hyperdensity of the major intracranial arteries or dural venous sinuses. Skull: The visualized skull base, calvarium and extracranial soft tissues are normal. Sinuses/Orbits: No fluid levels or advanced mucosal thickening of the visualized paranasal sinuses. No mastoid or middle ear effusion. The orbits are normal. IMPRESSION: 1. No acute intracranial abnormality. 2. Generalized atrophy and chronic microvascular ischemia. 3. Old small vessel infarcts of the left thalamus and right centrum semiovale. Electronically Signed   By: Ulyses Jarred M.D.   On: 06/29/2020 21:50   CT Lumbar Spine Wo Contrast  Result Date: 06/29/2020 CLINICAL DATA:  Bilateral leg weakness with low back pain EXAM: CT LUMBAR SPINE WITHOUT CONTRAST TECHNIQUE: Multidetector CT imaging of the lumbar spine was performed without intravenous contrast administration. Multiplanar CT image reconstructions were also generated. COMPARISON:  None. FINDINGS: Segmentation: Normal Alignment: Grade 1 retrolisthesis at L3-4 Vertebrae: No acute fracture or focal pathologic process. Paraspinal and other soft tissues: Calcific aortic atherosclerosis. Left nephrolithiasis. Disc levels: L1-2: Disc space narrowing without herniation. No spinal canal stenosis. Facet hypertrophy with mild bilateral foraminal stenosis. L2-3: Disc space narrowing with endplate spurring. Mild facet hypertrophy. Mild spinal canal stenosis. Severe right and moderate left foraminal stenosis. L3-4: Right asymmetric disc bulge with endplate spurring and mild facet hypertrophy. No spinal canal stenosis. Mild bilateral foraminal stenosis. L4-5: Right asymmetric disc bulge with endplate osteophytes. Moderate facet hypertrophy. Mild spinal canal stenosis. Severe right and moderate left foraminal stenosis. L5-S1: Left asymmetric disc bulge with endplate spurring. Facet hypertrophy. No spinal canal stenosis. Moderate right and severe left  neural foraminal stenosis.  IMPRESSION: 1. No acute fracture or static subluxation of the lumbar spine. 2. Mild spinal canal stenosis at L2-3 and L4-5. 3. Multilevel moderate-to-severe neural foraminal stenosis. Aortic Atherosclerosis (ICD10-I70.0). Electronically Signed   By: Ulyses Jarred M.D.   On: 06/29/2020 21:55   [name]  Result Date: 06/30/2020 CLINICAL DATA:  87 year old male with atrial fibrillation on Coumadin. Altered mental status. Prior stroke with right hand weakness. New bilateral lower extremity weakness for 3 or 4 days. EXAM: MRI HEAD WITHOUT CONTRAST TECHNIQUE: Multiplanar, multiecho pulse sequences of the brain and surrounding structures were obtained without intravenous contrast. COMPARISON:  Head CT yesterday.  Brain MRI 09/19/2019 and earlier. FINDINGS: Brain: Chronic left PCA territory ischemia including prominent chronic lacunar infarct of the left thalamus. No restricted diffusion to suggest acute infarction. No midline shift, mass effect, evidence of mass lesion, ventriculomegaly, extra-axial collection or acute intracranial hemorrhage. Cervicomedullary junction and pituitary are within normal limits. Mild progression of patchy white matter T2 and FLAIR hyperintensity in the right corona radiata since last year. Superimposed chronic T2 heterogeneity in the bilateral basal ganglia and pons also seems slightly progressed. But otherwise stable gray and white matter signal, including chronic microhemorrhage in the left dorsal pons. Vascular: Major intracranial vascular flow voids appear stable since last year. Skull and upper cervical spine: Multilevel degenerative changes in the visible cervical spine, evidence of mild degenerative spinal stenosis at C4-C5 on series 9, image 12, not included previously. Background bone marrow signal within normal limits. Sinuses/Orbits: Trace new paranasal sinus mucosal thickening. Stable and negative orbits. Other: Mastoids remain well aerated. Grossly normal visible  internal auditory structures. Negative visible scalp and face. IMPRESSION: 1. No acute intracranial abnormality. 2. Mild progression of bilateral cerebral white matter and basal ganglia chronic small vessel disease since last year. Stable chronic left PCA territory ischemia with involvement of the thalamus. 3. Cervical spine degeneration with mild spinal stenosis suspected at C4-C5. If there is myelopathy on physical exam then dedicated cervical spine MRI may be valuable. Electronically Signed   By: Genevie Ann M.D.   On: 06/30/2020 04:24   MR LUMBAR SPINE WO CONTRAST  Result Date: 06/30/2020 CLINICAL DATA:  87 year old male with atrial fibrillation on Coumadin. Altered mental status. Prior stroke with right hand weakness. New bilateral lower extremity weakness for 3 or 4 days. EXAM: MRI LUMBAR SPINE WITHOUT CONTRAST TECHNIQUE: Multiplanar, multisequence MR imaging of the lumbar spine was performed. No intravenous contrast was administered. COMPARISON:  Lumbar spine CT yesterday. CT Abdomen and Pelvis 10/17/2015. FINDINGS: Segmentation:  Normal on the CT yesterday. Alignment: Mild to moderate levoconvex lumbar scoliosis. Lumbar lordosis has not significantly changed since 2017. Vertebrae: Marrow edema in the L1 and L2 endplates eccentric to the left, with associated abnormal increased STIR signal in the disc space and mild edema in the medial left psoas muscle (series 15, image 16). However, vacuum phenomena within that disc space by CT has not significantly changed since 2017. And the right lateral endplates and other paraspinal soft tissues are spared. Schmorl's node related marrow edema in the T12 inferior endplate. Chronic degenerative endplate marrow signal changes elsewhere. Background bone marrow signal within normal limits. Degenerative sclerosis anterior right sacral ala is chronic. Intact visible sacrum and SI joints. Conus medullaris and cauda equina: Conus extends to the T12-L1 level. No signal  abnormality in the visible lower thoracic cord or conus. Spinal stenosis just below the conus at L1-L2 detailed below. Paraspinal and other soft tissues:  Paraspinal soft tissues are negative aside from mild edema in the medial left psoas muscle stated earlier. Stable visible abdominal viscera. Moderate distension of the urinary bladder with partially visible bladder wall thickening. Disc levels: No lower thoracic spinal stenosis through T12-L1. L1-L2: Moderate to severe multifactorial spinal stenosis with disc space loss, circumferential disc osteophyte complex and mild to moderate facet and ligament flavum hypertrophy greater on the left. Moderate associated left lateral recess stenosis (left L2 nerve level). Moderate to severe left and mild to moderate right L1 foraminal stenosis. L2-L3: Disc space loss with multifactorial moderate spinal and left greater than right lateral recess stenosis. Circumferential disc osteophyte complex and moderate posterior element hypertrophy. Severe bilateral L2 foraminal stenosis. L3-L4: Disc space loss with circumferential disc bulge. Lesser endplate spurring. Mild to moderate facet and ligament flavum hypertrophy. Mild to moderate spinal and bilateral lateral recess stenosis. Moderate bilateral L3 foraminal stenosis. L4-L5: Moderate to severe multifactorial spinal stenosis with circumferential disc osteophyte complex and up to moderate facet and ligament flavum hypertrophy greater on the right. Moderate to severe bilateral lateral recess and foraminal stenosis (L5 and L4 nerve levels, respectively). L5-S1: Disc space loss with bulky far lateral disc osteophyte complex. Mild to moderate facet hypertrophy. No spinal or lateral recess stenosis. Moderate to severe left and moderate right L5 foraminal stenosis. IMPRESSION: 1. Inflammatory signal in the L1-L2 disc, left lateral endplates, and the medial left psoas muscle. But in conjunction with the recent CT appearance this is more  compatible with acute exacerbation of chronic disc and endplate degeneration than an infectious discitis/osteomyelitis. Query fever. Blood cultures recommended if the clinical picture is equivocal for infection. Subsequent moderate to severe spinal, moderate left lateral recess and moderate to severe left greater than right foraminal stenosis. 2. Lumbar spine degeneration elsewhere with multifactorial spinal stenosis L3-L4 (moderate) through L4-L5 (severe). At least moderate associated lateral recess and foraminal stenosis at each level. Moderate to severe left greater than right L5-S1 foraminal stenosis. 3. Distended, thick-walled urinary bladder. Query bladder outlet obstruction or bladder retention. Electronically Signed   By: Genevie Ann M.D.   On: 06/30/2020 04:36    EKG: Independently reviewed. Sinus rhythm, IVCD, LVH with related mild ST elevation. No acute changes. No changes from previous EKGs.  Assessment/Plan Active Problems:   Lumbar disc disease with radiculopathy   HTN (hypertension)   Paroxysmal atrial fibrillation (HCC)   Controlled type 2 diabetes mellitus with complication, without long-term current use of insulin (HCC)   Chronic diastolic HF (heart failure) (HCC)   Dyslipidemia   Hypothyroidism   Bilateral leg weakness    1. Lumbar disc disease with radiculopathy/bilateral leg weakness - imaging reveals spinal stenosis and  possible acute disc disease L1-2 vs diskitis. Patient has had fevers. No significant leukocytosis. Plan  Diskitis evaluation with CRP, ESR, blood cx's x 2, f/u CBC this AM  If additional studies inconclusive he may come to Needle bx L1-2 disc by IR  Without acute systemic infection w/ nl lactic acid, nl WBCs will hold off on starting Abx pending additional lab studies.   PT evaluation  NSAIDS - celebrex for inflammation, pain  2. HTN - BP stable. Continue home meds  3. HFpEF - well compensated. Continue home meds  4. DM - diet controlled Plan   A1C  Low carb diet  Sliding scale coverage  5. Hypothyroidism Plan TSH  Continue levothyroxine  6. PAF - holding sinus rhythm. No anticipated surgical intervention. May come to needle bx Plan Stop warfarin  As INR comes down recommend SQ heparin    DVT prophylaxis: warfarin  Code Status: full code Family Communication: spoke at length with York Ram, daughter explaining Diagnostic possibilities, diagnostic plan.  Disposition Plan: TBD  Consults called: none (with names) Admission status: obs    Adella Hare MD [facility] Pager [phone]  If 7PM-7AM, please contact night-coverage [email or web address] Password [facility]  06/30/2020, 5:33 AM

## 2020-06-30 NOTE — Progress Notes (Signed)
ANTICOAGULATION CONSULT NOTE - Initial Consult  Pharmacy Consult for Coumadin Indication: atrial fibrillation  Allergies  Allergen Reactions  . Penicillins     Unknown reaction    Patient Measurements: Height: 5' 8" (172.7 cm) Weight: 78.5 kg (173 lb) IBW/kg (Calculated) : 68.4  Vital Signs: Temp: 99.2 F (37.3 C) (04/10 1751) Temp Source: Oral (04/10 1751) BP: 175/84 (04/11 0000) Pulse Rate: 66 (04/11 0000)  Labs: Recent Labs    06/29/20 2100 06/29/20 2304  HGB 12.4*  --   HCT 37.4*  --   PLT 214  --   LABPROT 17.8*  --   INR 1.5*  --   CREATININE 0.85  --   TROPONINIHS 37* 34*    Estimated Creatinine Clearance: 61.5 mL/min (by C-G formula based on SCr of 0.85 mg/dL).   Medical History: Past Medical History:  Diagnosis Date  . Atrial flutter ([place])   . BPH (benign prostatic hyperplasia)   . Coronary artery disease    a. s/p MI tx with Cypher DES to pRCA in 4/04;  b. Echocardiogram 7/09: Normal LV function.  c. Nuclear study 3/13 no ischemia;  d. ETT 2/14 neg;  e. admx with CP => LHC (01/30/2013):  pLAD 40-50%, oD1 70-80%, oOM1 40%, pRCA stent patent, mid RCA 30%, EF 60-65%. => med Rx.  . DDD (degenerative disc disease)   . Diabetes mellitus without complication ([place])   . Dyslipidemia   . Gait disorder 07/13/2018  . Hemorrhoids   . Hx MRSA infection    left buttocks abscess  . Hx of echocardiogram    a. Echocardiogram (01/31/2013): Mild focal basal and mild concentric hypertrophy of the septum, EF 50-55%, normal wall motion, grade 1 diastolic dysfunction, trivial AI, MAC, mild LAE, PASP 35  . Hyperlipidemia   . Hypertension   . Hypothyroidism   . Meniere's disease    Status post shunt  . Myocardial infarction ([place]) 2008  . Occlusion and stenosis of carotid artery without mention of cerebral infarction    40-59% on carotid doppler 2014; [place] (01/2013): R 1-39%, L 60-79%  . Rotator cuff injury    chronic rotator cuff injury status post repair  . Seizure  disorder ([place])   . Stroke [facility])    a. 01/2013=> post cardiac cath CVA to L post communicating artery system; R sided weakness  . Syncope     Medications:  No current facility-administered medications on file prior to encounter.   Current Outpatient Medications on File Prior to Encounter  Medication Sig Dispense Refill  . acetaminophen (TYLENOL) 325 MG tablet Take 1-2 tablets (325-650 mg total) by mouth every 4 (four) hours as needed for mild pain.    Marland Kitchen albuterol (VENTOLIN HFA) 108 (90 Base) MCG/ACT inhaler Inhale 2 puffs into the lungs every 4 (four) hours as needed for wheezing or shortness of breath. 18 g 11  . amoxicillin (AMOXIL) 500 MG capsule Take 500 mg by mouth 4 (four) times daily.    Marland Kitchen atorvastatin (LIPITOR) 80 MG tablet TAKE 1 TABLET BY MOUTH EVERY DAY 6PM 90 tablet 1  . carbamazepine (CARBATROL) 300 MG 12 hr capsule TAKE 1 CAPSULE BY MOUTH TWICE A DAY 180 capsule 3  . cloNIDine (CATAPRES) 0.2 MG tablet TAKE 1 TABLET(0.2 MG) BY MOUTH TWICE DAILY 180 tablet 3  . escitalopram (LEXAPRO) 5 MG tablet TAKE 1 TABLET BY MOUTH EVERYDAY AT BEDTIME 90 tablet 1  . furosemide (LASIX) 20 MG tablet Take 0.5 tablets (10 mg total) by mouth daily. Star  tablet 2  . glucose blood test strip 1 each by Other route as needed. Use as instructed 100 each 3  . glucose monitoring kit (FREESTYLE) monitoring kit 1 each by Does not apply route as needed. 1 each 0  . isosorbide mononitrate (IMDUR) 60 MG 24 hr tablet Take 1 tablet (60 mg total) by mouth daily. 90 tablet 3  . Lancets (ACCU-CHEK SAFE-T PRO) lancets 1 each by Other route as needed. Use as instructed 100 each 3  . levETIRAcetam (KEPPRA) 250 MG tablet Take 1 tablet (250 mg total) by mouth 2 (two) times daily. 180 tablet 1  . levothyroxine (SYNTHROID) 75 MCG tablet TAKE 1 TABLET(75 MCG) BY MOUTH DAILY 90 tablet 0  . losartan (COZAAR) 100 MG tablet Take 1 tablet (100 mg total) by mouth daily. 90 tablet 1  . metoprolol tartrate (LOPRESSOR) 100 MG tablet  TAKE 1 TABLET(100 MG) BY MOUTH TWICE DAILY 180 tablet 1  . Multiple Vitamins-Minerals (MULTIVITAMINS THER. W/MINERALS) TABS Take 1 tablet by mouth at bedtime.      Marland Kitchen warfarin (COUMADIN) 7.5 MG tablet TAKE 1&1/2 TABS TUESDAY, THURSDAY & SATURDAY*1 TAB ON SUNDAY, MONDAY, WEDNESDAY & FRIDAY WITH SUPPER 120 tablet 3     Assessment: 87 y.o. male admitted with leg weakenss, h/o Afib, to continue Coumadin.    Goal of Therapy:  INR 2-3 Monitor platelets by anticoagulation protocol: Yes   Plan:  Coumadin 7.5 mg today Daily INR  Caryl Pina 06/30/2020,12:22 AM

## 2020-06-30 NOTE — TOC Initial Note (Addendum)
Transition of Care [facility]) - Initial/Assessment Note    Patient Details  Name: SALEM [name] MRN: [id number] Date of Birth: 12-30-1933  [facility] CM/SW Contact:    Daniel Favre, RN Phone Number: 06/30/2020, 4:13 PM  Clinical Narrative:                 Spoke to patient's daughter [name] [phone] via phone.   Patient from home with his wife.   [name] spoke to PT this morning and is aware of recommendations for HHPT with supervision. [name] and her siblings can provide supervision at home for a couple of days , but they all work.   [name] is planning on meeting PT in her dad's room tomorrow at 4 pm to see exactly how he is moving. She does want home health PT. Referral given to [facility] with [facility] . [name] will review referral and let NCM know determination. [facility]   Expected Discharge Plan: [place]     Patient Goals and CMS Choice     Choice offered to / list presented to : [place]  Expected Discharge Plan and Services Expected Discharge Plan: [place]   Discharge Planning Services: CM Consult Post Acute Care Choice: [place] arrangements for the past 2 months: Single Family Home                   DME Agency: NA       HH Arranged: PT,OT [place] Agency: [place] ([place]) Date [place]: 06/30/20 Time [place]: 1612 Representative spoke with at [place]: [name] reviewing to see if she can accept referal  Prior Living Arrangements/Services Living arrangements for the past 2 months: [place] with:: Spouse Patient language and need for interpreter reviewed:: Yes Do you feel safe going back to the place where you live?: Yes      Need for Family Participation in Patient Care: Yes (Comment) Care giver support system in place?: Yes (comment)   Criminal Activity/Legal Involvement Pertinent to Current  Situation/Hospitalization: No - Comment as needed  Activities of Daily Living      Permission Sought/Granted   Permission granted to share information with : Yes, Verbal Permission Granted  Share Information with NAMEYork [name] [phone]  Permission granted to share info w AGENCY: [place] granted to share info w Relationship: daughter     Emotional Assessment              Admission diagnosis:  Bilateral leg weakness [R29.898] Weakness of both lower extremities [R29.898] Acute midline low back pain without sciatica [M54.50] Patient Active Problem List   Diagnosis Date Noted  . Lumbar disc disease with radiculopathy 06/30/2020  . Bilateral leg weakness 06/30/2020  . Chronic diastolic HF (heart failure) ([place]) 01/16/2020  . Acute exacerbation of CHF (congestive heart failure) ([place]) 08/19/2018  . Hypertensive urgency 08/19/2018  . Gait disorder 07/13/2018  . Recurrent syncope 07/13/2018  . E. coli UTI 06/19/2018  . Leucocytosis 06/19/2018  . Normal pressure hydrocephalus ([place]) 06/01/2018  . Altered mental status 05/31/2018  . AMS (altered mental status) 05/30/2018  . Seizure disorder ([place]) 05/30/2018  . Fracture of proximal phalanx of finger fifth 06/17/2017 07/21/2017  . Upper airway cough syndrome 11/01/2016  . Atrial flutter ([place]) 08/05/2016  . Coronary artery disease involving native coronary artery of native heart without angina  pectoris 08/05/2016  . Chronic anticoagulation 12/31/2015  . History of seizures 12/31/2015  . Acute diastolic CHF (congestive heart failure) ([place]) 12/24/2015  . Pulmonary edema   . Paroxysmal atrial fibrillation ([facility])   . Hypothyroidism   . Controlled type 2 diabetes mellitus with complication, without long-term current use of insulin ([place])   . Hypertensive emergency 12/20/2015  . Special screening for malignant neoplasms, colon 12/07/2013  . Bowel habit changes 12/07/2013  . Diarrhea 03/09/2013  . History of  cardioembolic cerebrovascular accident (CVA) 01/31/2013  . Chest pain 01/29/2013  . Bilateral carotid artery disease ([place])   . Loss of coordination 12/03/2012  . Morbid obesity due to excess calories ([place]) 08/20/2011  . CAD S/P percutaneous coronary angioplasty   . Dyslipidemia   . HTN (hypertension)    PCP:  Daniel Frizzle, MD Pharmacy:   [facility] [place], [place] [address] [place] [address] Phone: [phone] Fax: [phone]  [id number] [place], [place] - [address] [facility] [place] [place] [address] Phone: [phone] Fax: [phone]     Social Determinants of Health (SDOH) Interventions    Readmission Risk Interventions Readmission Risk Prevention Plan 08/21/2018  Transportation Screening Complete  PCP or Specialist Appt within 3-5 Days Complete  HRI or [place] Patient refused  Social Work Consult for [place] Planning/Counseling Complete  Palliative Care Screening Not Applicable  Medication Review Press photographer) Complete  Some recent data might be hidden

## 2020-06-30 NOTE — ED Notes (Signed)
Patient transported to MRI 

## 2020-06-30 NOTE — ED Provider Notes (Signed)
  Provider Note MRN:  [id number]  Arrival date & time: 06/30/20    ED Course and Medical Decision Making  Assumed care from Dr. Langston Masker upon patient transfer.  Abrupt lower extremity weakness over the past 2 days, transferred for MRI imaging.  5 AM update: MRI revealing inflammation with question of discitis.  Will admit to medicine for further testing and treatment.  Procedures  Final Clinical Impressions(s) / ED Diagnoses     ICD-10-CM   1. Weakness of both lower extremities  R29.898   2. Acute midline low back pain without sciatica  M54.50     ED Discharge Orders    None      Discharge Instructions   None     Barth Kirks. [place] Small, [place] [email or web address]    Maudie Flakes, MD 06/30/20 [phone]

## 2020-06-30 NOTE — Evaluation (Signed)
Physical Therapy Evaluation Patient Details Name: [name] MRN: [id number] DOB: 05-09-1933 Today's Date: 06/30/2020   History of Present Illness  87 yo male [facility] on [facility]/10 with progressively worsening LE weakness. Imaging shows spinal stenosis, possible acute disc disease L1-2 vs discitis. PMH includes aflutter, BPH, CAD s/p MI and stenting, DM , HLD, HTN, meniere's disease, RTC repair, seizures, CVA with residual R hand weakness, HF.  Clinical Impression   Pt presents with generalized LE weakness, mild LBP, mild difficulty performing mobility tasks, impaired gait, and decreased activity tolerance vs baseline. Pt to benefit from acute PT to address deficits. Pt ambulated hallway x2 room distances this day with close guard for safety, pt only requiring light physical assist when transitioning from bed to standing. Per pt, he is feeling weak but has less back pain than on initial admission. PT spoke with pt's daughter, who states her and sisters could assist pt at home as needed, pt's wife has dementia but does not require physical assist per daughter's report. PT recommending HHPT to address deficits, pt does not drive and lacks tolerance for OP at this time. PT to progress mobility as tolerated, and will continue to follow acutely.      Follow Up Recommendations Home health PT;Supervision for mobility/OOB    Equipment Recommendations  None recommended by PT    Recommendations for Other Services       Precautions / Restrictions Precautions Precautions: Fall Precaution Comments: reports "many falls" in the past few years Restrictions Weight Bearing Restrictions: No      Mobility  Bed Mobility Overal bed mobility: Needs Assistance Bed Mobility: Supine to Sit     Supine to sit: Min assist     General bed mobility comments: min asist for trunk elevation to upright, increased time and effort.    Transfers Overall transfer level: Needs assistance Equipment used:  Rolling walker (2 wheeled) Transfers: Sit to/from Stand Sit to Stand: Min guard;Min assist         General transfer comment: min guard for stand from EOB for safety, min assist to rise from standard height toilet. VC for hand placement  Ambulation/Gait Ambulation/Gait assistance: Min guard Gait Distance (Feet): 30 Feet (+20 for to and from bathroom) Assistive device: Rolling walker (2 wheeled) Gait Pattern/deviations: Step-through pattern;Decreased stride length;Trunk flexed Gait velocity: decr   General Gait Details: min guard for safety, verbal cuing for upright posture, placement in RW  Stairs            Wheelchair Mobility    Modified Rankin (Stroke Patients Only)       Balance Overall balance assessment: Needs assistance;History of Falls Sitting-balance support: No upper extremity supported;Feet supported Sitting balance-Leahy Scale: Fair     Standing balance support: Bilateral upper extremity supported;During functional activity Standing balance-Leahy Scale: Poor Standing balance comment: reliant on external assist                             Pertinent Vitals/Pain Pain Assessment: Faces Faces Pain Scale: Hurts a little bit Pain Location: back Pain Descriptors / Indicators: Sore;Discomfort Pain Intervention(s): Limited activity within patient's tolerance;Monitored during session;Repositioned    Home Living Family/patient expects to be discharged to:: Private residence Living Arrangements: Spouse/significant other Available Help at Discharge: Family;Available 24 hours/day (can arrange for 24/7 from daughters, or at least during waking hours) Type of Home: House Home Access: Stairs to enter   [employer] of Steps: 2 Home  Layout: One level Home Equipment: Walker - 4 wheels;Shower seat;Cane - single point;Grab bars - toilet;Grab bars - tub/shower;Bedside commode      Prior Function Level of Independence: Independent with assistive  device(s)         Comments: pt reports using rollator for mobility, frequent falls with at least one in the last 6 months     Hand Dominance   Dominant Hand: Right    Extremity/Trunk Assessment   Upper Extremity Assessment Upper Extremity Assessment: Defer to OT evaluation    Lower Extremity Assessment Lower Extremity Assessment: Generalized weakness (weak symmetrically; able to perform SLR bilaterally with no increased back pain reported)    Cervical / Trunk Assessment Cervical / Trunk Assessment: Normal  Communication   Communication: HOH  Cognition Arousal/Alertness: Awake/alert Behavior During Therapy: WFL for tasks assessed/performed Overall Cognitive Status: No family/caregiver present to determine baseline cognitive functioning                                 General Comments: Pt at times requires increased processing time, VCs for mobility. Otherwise, pleasant and WFL      General Comments General comments (skin integrity, edema, etc.): pt incontinent of urine in standing    Exercises     Assessment/Plan    PT Assessment Patient needs continued PT services  PT Problem List Decreased strength;Decreased mobility;Decreased safety awareness;Decreased activity tolerance;Decreased balance;Decreased knowledge of use of DME;Pain       PT Treatment Interventions DME instruction;Therapeutic activities;Gait training;Patient/family education;Therapeutic exercise;Stair training;Balance training;Functional mobility training;Neuromuscular re-education    PT Goals (Current goals can be found in the Care Plan section)  Acute Rehab PT Goals Patient Stated Goal: go home, get my legs stronger PT Goal Formulation: With patient Time For Goal Achievement: 07/14/20 Potential to Achieve Goals: Good    Frequency Min 3X/week   Barriers to discharge        Co-evaluation               AM-PAC PT "6 Clicks" Mobility  Outcome Measure Help needed turning  from your back to your side while in a flat bed without using bedrails?: A Little Help needed moving from lying on your back to sitting on the side of a flat bed without using bedrails?: A Little Help needed moving to and from a bed to a chair (including a wheelchair)?: A Little Help needed standing up from a chair using your arms (e.g., wheelchair or bedside chair)?: A Little Help needed to walk in hospital room?: A Little Help needed climbing 3-5 steps with a railing? : A Little 6 Click Score: 18    End of Session Equipment Utilized During Treatment: Gait belt Activity Tolerance: Patient tolerated treatment well Patient left: in chair;with chair alarm set;with call bell/phone within reach Nurse Communication: Mobility status PT Visit Diagnosis: Other abnormalities of gait and mobility (R26.89);Difficulty in walking, not elsewhere classified (R26.2)    Time: 0737-1062 PT Time Calculation (min) (ACUTE ONLY): 43 min   Charges:   PT Evaluation $PT Eval Low Complexity: 1 Low PT Treatments $Gait Training: 8-22 mins $Therapeutic Activity: 8-22 mins        Stacie Glaze, PT Acute Rehabilitation Services Pager [phone]  Office [phone]  Roxine Caddy E Ruffin Pyo 06/30/2020, 2:44 PM

## 2020-06-30 NOTE — ED Notes (Signed)
From [facility] for MRI due to increased weakness over the weekend. Hx of chronic back pain & stroke. Pt A&Ox4. No pain reported currently.

## 2020-06-30 NOTE — Progress Notes (Signed)
No charge progress note.  [name] is a 87 y.o. male with medical history significant of of A Fib on coumadin,stroke with residual right hand weakness, MI, presenting to emergency department with generalized weakness. Patient is here with his daughter. She reports that he has had bilateral leg weakness since Thursday. The patient did complain of some low back pain, worse with ambulation. He does report having fever > 100 degrees F intermittently since last Thursday. He denies dysuria, respiratory symptoms, new wounds or having had any procedures. He has difficulty ambulating, because he feels both his legs are weak. He denies numbness in his legs. He does have worsening pain when he extends his legs. His family feels like they have been needing to carry him, normally can walk independently. Yet, he does report that he has been ambulating short distances with assistance. He intially presented to [facility] but was transferred to [facility] for MRI imaging.   ED Course: T 100.2  151/72  HR 60  RR 23, Covid negative, U/A negative. EDP exam at [facility] notable for preserved strength LE. CT lumbar spine revealed mild canal stenosis L2-3, L4-5, moderate to severe neural foramina stenosis. CT head w/o acute findings. MRI lumbar spine with inflammatory signal L1-2 disc and medial left psoas without abscess. Also revealed multi-level spinal stenosis. CBCD nl, Cmet nl, TSH 2.005. Patient referred to [facility] for continued evaluation and management Diskitis vs DDD lumbar spine.   When patient was seen today he was feeling better.  Continue to have some increased urinary frequency, and denies any dysuria.  No leukocytosis, CRP and ESR within normal limit.  Blood and urine cultures pending.  Discitis seems less likely with his normal inflammatory markers.  -We will check bladder scan to rule out urinary retention. -PT is recommending home health services which were ordered. -I will hold antibiotics at  this time, -Add Flomax

## 2020-07-01 DIAGNOSIS — M545 Low back pain, unspecified: Secondary | ICD-10-CM

## 2020-07-01 DIAGNOSIS — M5116 Intervertebral disc disorders with radiculopathy, lumbar region: Secondary | ICD-10-CM | POA: Diagnosis not present

## 2020-07-01 DIAGNOSIS — E039 Hypothyroidism, unspecified: Secondary | ICD-10-CM | POA: Diagnosis not present

## 2020-07-01 DIAGNOSIS — I10 Essential (primary) hypertension: Secondary | ICD-10-CM | POA: Diagnosis not present

## 2020-07-01 DIAGNOSIS — I480 Paroxysmal atrial fibrillation: Secondary | ICD-10-CM | POA: Diagnosis not present

## 2020-07-01 DIAGNOSIS — I5032 Chronic diastolic (congestive) heart failure: Secondary | ICD-10-CM | POA: Diagnosis not present

## 2020-07-01 DIAGNOSIS — R29898 Other symptoms and signs involving the musculoskeletal system: Secondary | ICD-10-CM | POA: Diagnosis not present

## 2020-07-01 DIAGNOSIS — E785 Hyperlipidemia, unspecified: Secondary | ICD-10-CM | POA: Diagnosis not present

## 2020-07-01 DIAGNOSIS — E118 Type 2 diabetes mellitus with unspecified complications: Secondary | ICD-10-CM | POA: Diagnosis not present

## 2020-07-01 LAB — CBC
HCT: 33.7 % — ABNORMAL LOW (ref 39.0–52.0)
Hemoglobin: 11.3 g/dL — ABNORMAL LOW (ref 13.0–17.0)
MCH: 31.6 pg (ref 26.0–34.0)
MCHC: 33.5 g/dL (ref 30.0–36.0)
MCV: 94.1 fL (ref 80.0–100.0)
Platelets: 214 10*3/uL (ref 150–400)
RBC: 3.58 MIL/uL — ABNORMAL LOW (ref 4.22–5.81)
RDW: 15 % (ref 11.5–15.5)
WBC: 8 10*3/uL (ref 4.0–10.5)
nRBC: 0 % (ref 0.0–0.2)

## 2020-07-01 LAB — PROTIME-INR
INR: 1.7 — ABNORMAL HIGH (ref 0.8–1.2)
Prothrombin Time: 19.1 seconds — ABNORMAL HIGH (ref 11.4–15.2)

## 2020-07-01 LAB — GLUCOSE, CAPILLARY
Glucose-Capillary: 111 mg/dL — ABNORMAL HIGH (ref 70–99)
Glucose-Capillary: 185 mg/dL — ABNORMAL HIGH (ref 70–99)

## 2020-07-01 MED ORDER — ISOSORBIDE MONONITRATE ER 60 MG PO TB24: 30.0000 mg | ORAL_TABLET | Freq: Every day | ORAL | 3 refills | Status: DC

## 2020-07-01 MED ORDER — SODIUM CHLORIDE 0.9 % IV BOLUS
500.0000 mL | Freq: Once | INTRAVENOUS | Status: AC
  Administered 2020-07-01: 500 mL via INTRAVENOUS

## 2020-07-01 MED ORDER — TAMSULOSIN HCL 0.4 MG PO CAPS: 0.4000 mg | ORAL_CAPSULE | Freq: Every day | ORAL | 0 refills | Status: DC

## 2020-07-01 MED ORDER — LOSARTAN POTASSIUM 100 MG PO TABS: 50.0000 mg | ORAL_TABLET | Freq: Every day | ORAL | 1 refills | Status: DC

## 2020-07-01 NOTE — Progress Notes (Signed)
Notified Dr. Reesa Chew of patient blood pressure 83/43 HR 51. Patient is asymptomatic with SBP in 80's. Per MD will place an order for one time dose of 500 cc normal saline bolus.

## 2020-07-01 NOTE — Evaluation (Signed)
Occupational Therapy Evaluation Patient Details Name: [name] MRN: [id number] DOB: 1933-09-25 Today's Date: 07/01/2020    History of Present Illness 87 yo male presents to [facility] on 4/10 with progressively worsening LE weakness. Imaging shows spinal stenosis, possible acute disc disease L1-2 vs discitis. PMH includes aflutter, BPH, CAD s/p MI and stenting, DM , HLD, HTN, meniere's disease, RTC repair, seizures, CVA with residual R hand weakness, HF.   Clinical Impression   Pt admitted to ED for concerns listed above. PTA pt reported being independent in all ADL's and IADL's, only using a rollator for ambulation on uneven ground. At the time of the evaluation, pt required min guard for all functional mobility w/ RW and verbal cues for RW safety. Pt reported that his rollator is easier for him to use than a 2 wheeled walker. Pt will benefit from continued acute OT services to address concerns listed below. Acute OT will follow.     Follow Up Recommendations  Home health OT    Equipment Recommendations  None recommended by OT    Recommendations for Other Services       Precautions / Restrictions Precautions Precautions: Fall Restrictions Weight Bearing Restrictions: No      Mobility Bed Mobility Overal bed mobility: Needs Assistance Bed Mobility: Supine to Sit     Supine to sit: Min assist     General bed mobility comments: min asist for trunk elevation to upright, increased time and effort.    Transfers Overall transfer level: Needs assistance Equipment used: Rolling walker (2 wheeled) Transfers: Sit to/from Stand Sit to Stand: Min guard         General transfer comment: min guard for stand from EOB for safety    Balance Overall balance assessment: Needs assistance Sitting-balance support: No upper extremity supported;Feet supported Sitting balance-Leahy Scale: Fair     Standing balance support: Bilateral upper extremity supported;No upper extremity  supported;During functional activity Standing balance-Leahy Scale: Poor Standing balance comment: Pt unbalanced when completing dynamic standing and no UE support. When supported w/ BUE, pt balance increases in standing.                           ADL either performed or assessed with clinical judgement   ADL Overall ADL's : Needs assistance/impaired Eating/Feeding: Modified independent;Sitting Eating/Feeding Details (indicate cue type and reason): Sitting in bed, can bring food to mouth and open containers/lids Grooming: Wash/dry face;Wash/dry hands;Min guard;Standing Grooming Details (indicate cue type and reason): completed standing at sink Upper Body Bathing: Supervision/ safety;Sitting Upper Body Bathing Details (indicate cue type and reason): Pt able to wash off all upper body sitting on a Stella Lower Body Bathing: Min guard;Sitting/lateral leans;Sit to/from stand Lower Body Bathing Details (indicate cue type and reason): Pt able to complete peri care, and lower body bathing sitting and standing intermittently. Upper Body Dressing : Modified independent;Sitting Upper Body Dressing Details (indicate cue type and reason): Pt able to don and doff hospital gowns Lower Body Dressing: Supervision/safety;Sitting/lateral leans Lower Body Dressing Details (indicate cue type and reason): Pt able to reach down and don socks while sitting. Toilet Transfer: Min Retail banker and Hygiene: Min guard;Sit to/from stand;Sitting/lateral lean       Functional mobility during ADLs: Min guard;Rolling walker General ADL Comments: Pt able to complete all functional mobility using a RW w/ min guard for safety, pt is unbalanced when standing at the sink completing dynamic activities.  Vision Baseline Vision/History: Wears glasses Wears Glasses: At all times Patient Visual Report: No change from baseline       Perception Perception Perception Tested?: No    Praxis Praxis Praxis tested?: Not tested    Pertinent Vitals/Pain Pain Assessment: No/denies pain Faces Pain Scale: No hurt     Hand Dominance Right   Extremity/Trunk Assessment Upper Extremity Assessment Upper Extremity Assessment: Generalized weakness   Lower Extremity Assessment Lower Extremity Assessment: Defer to PT evaluation   Cervical / Trunk Assessment Cervical / Trunk Assessment: Normal   Communication Communication Communication: No difficulties   Cognition Arousal/Alertness: Awake/alert Behavior During Therapy: WFL for tasks assessed/performed Overall Cognitive Status: Within Functional Limits for tasks assessed                                     General Comments  VSS on RA    Exercises     Shoulder Instructions      Home Living Family/patient expects to be discharged to:: Private residence Living Arrangements: Spouse/significant other Available Help at Discharge: Family;Available 24 hours/day Type of Home: House Home Access: Stairs to enter [employer] of Steps: 2   Home Layout: One level     Bathroom Shower/Tub: Occupational psychologist: Handicapped height Bathroom Accessibility: Yes How Accessible: Accessible via walker Home Equipment: [place] - 4 wheels;Shower seat;Cane - single point;Grab bars - toilet;Grab bars - tub/shower;Bedside commode          Prior Functioning/Environment Level of Independence: Independent with assistive device(s)        Comments: Pt reportes using rollator iintermittently, when outside on uneven ground        OT Problem List: Decreased strength;Decreased activity tolerance;Impaired balance (sitting and/or standing);Decreased safety awareness;Decreased knowledge of use of DME or AE      OT Treatment/Interventions: Therapeutic exercise;Self-care/ADL training;Neuromuscular education;Energy conservation;DME and/or AE instruction;Therapeutic activities;Patient/family  education;Balance training    OT Goals(Current goals can be found in the care plan section) Acute Rehab OT Goals Patient Stated Goal: go home, get my legs stronger OT Goal Formulation: With patient Time For Goal Achievement: 07/15/20 Potential to Achieve Goals: Good ADL Goals Pt Will Perform Upper Body Bathing: with modified independence;sitting Pt Will Perform Lower Body Bathing: with modified independence;sit to/from stand Pt Will Perform Upper Body Dressing: with modified independence;sitting Pt Will Perform Lower Body Dressing: with modified independence;sit to/from stand;sitting/lateral leans Pt Will Transfer to Toilet: with modified independence;regular height toilet Pt Will Perform Toileting - Clothing Manipulation and hygiene: with modified independence;sitting/lateral leans;sit to/from stand  OT Frequency: Min 2X/week   Barriers to D/C:            Co-evaluation              AM-PAC OT "6 Clicks" Daily Activity     Outcome Measure Help from another person eating meals?: None Help from another person taking care of personal grooming?: A Little Help from another person toileting, which includes using toliet, bedpan, or urinal?: A Little Help from another person bathing (including washing, rinsing, drying)?: A Little Help from another person to put on and taking off regular upper body clothing?: A Little Help from another person to put on and taking off regular lower body clothing?: A Little 6 Click Score: 19   End of Session Equipment Utilized During Treatment: Gait belt;Rolling walker Nurse Communication: Mobility status;Other (comment) (Condom Cath fell off prior to session  starting)  Activity Tolerance: Patient tolerated treatment well Patient left: in chair;with call bell/phone within reach;with chair alarm set  OT Visit Diagnosis: Unsteadiness on feet (R26.81);Muscle weakness (generalized) (M62.81)                Time: 9470-9628 OT Time Calculation (min): 37  min Charges:  OT General Charges $OT Visit: 1 Visit OT Evaluation $OT Eval Moderate Complexity: 1 Mod OT Treatments $Self Care/Home Management : 8-22 mins  Oluwasemilore Pascuzzi H., OTR/L Acute Rehabilitation  Mckensey Berghuis Elane Drystan Reader 07/01/2020, 12:21 PM

## 2020-07-01 NOTE — Discharge Summary (Signed)
Physician Discharge Summary  THANE AGE KDX:[id number] DOB: [birth year] DOA: 06/29/2020  PCP: [name], MD  Admit date: 06/29/2020 Discharge date: 07/01/2020  Admitted From: Home Disposition: Home  Recommendations for Outpatient Follow-up:  1. Follow up with PCP in 1-2 weeks 2. Please obtain BMP/CBC in one week 3. Please follow up on the following pending results: None  Home Health: Yes Equipment/Devices: Rolling walker Discharge Condition: Stable CODE STATUS: Full Diet recommendation: Heart Healthy / Carb Modified   Brief/Interim Summary: Daniel Nouri Loftisis a 87 y.o.malewith medical history significant ofof A Fib on coumadin,stroke with residual right hand weakness, MI, presenting to emergency department with generalized weakness. Patient is here with his daughter. She reports that he has had bilateral leg weakness since Thursday. The patient did complain of some low back pain, worse with ambulation.He does report having fever > 100 degrees F intermittently since last Thursday. He denies dysuria, respiratory symptoms, new wounds or having had any procedures.Hehasdifficulty ambulating, because he feels both his legs are weak. He denies numbness in his legs. He does have worsening painwhen heextends his legs. His family feels like they have beenneedingto carry him, normally can walk independently. Yet, he does report that he has been ambulating short distances with assistance. He intially presented to [facility] but was transferred to [facility] for MRI imaging.  On arrival he was febrile at 100,CT lumbar spine revealed mild canal stenosis L2-3, L4-5, moderate to severe neural foramina stenosis. CT head w/o acute findings. MRI lumbar spine with inflammatory signal L1-2 disc and medial left psoas without abscess. Also revealed multi-level spinal stenosis.  Concern of either discitis or exacerbation of his chronic DDD. Patient remained afebrile except 1 initial  reading of 100 F, no leukocytosis, CRP and ESR within normal limit.  Blood cultures remain negative.  UA was Sterile.  Patient appears little dehydrated.  He did had an episode of becoming little hypotensive although remained asymptomatic.  On multiple antihypertensive meds.  Home dose of losartan and Imdur was decreased.  He was advised to follow-up closely with his primary care provider for titration of his antihypertensives.  It will be better if he can get off from clonidine, should be done gradually to avoid rebound hypertension.  Patient was also having some increased urinary frequency, complaining about some dribbling and difficult to control.  He was not on any medications to help with his prostate. He was started on Flomax and advised to follow-up with urology as an outpatient for further recommendations.  He will continue rest of his home meds except with some decrease in dose and will follow up with his providers.  Discharge Diagnoses:  Active Problems:   Dyslipidemia   HTN (hypertension)   Paroxysmal atrial fibrillation (HCC)   Hypothyroidism   Controlled type 2 diabetes mellitus with complication, without long-term current use of insulin (HCC)   Chronic diastolic HF (heart failure) (HCC)   Lumbar disc disease with radiculopathy   Weakness of both lower extremities   Acute midline low back pain without sciatica   Discharge Instructions  Discharge Instructions    Diet - low sodium heart healthy   Complete by: As directed    Discharge instructions   Complete by: As directed    It was pleasure taking care of you. If you continue to experience difficulty urination or some dribbling, please follow-up with urology as an outpatient. We added a new medicine called Flomax for your prostate as it will help with your urinary problems. Continue  taking rest of your home medications and follow-up with your primary care doctor.   Increase activity slowly   Complete by: As directed       Allergies as of 07/01/2020      Reactions   Penicillins    Unknown reaction      Medication List    STOP taking these medications   amoxicillin 500 MG capsule Commonly known as: AMOXIL     TAKE these medications   accu-chek safe-t pro lancets 1 each by Other route as needed. Use as instructed   acetaminophen 325 MG tablet Commonly known as: TYLENOL Take 1-2 tablets (325-650 mg total) by mouth every 4 (four) hours as needed for mild pain.   albuterol 108 (90 Base) MCG/ACT inhaler Commonly known as: VENTOLIN HFA Inhale 2 puffs into the lungs every 4 (four) hours as needed for wheezing or shortness of breath.   atorvastatin 80 MG tablet Commonly known as: LIPITOR TAKE 1 TABLET BY MOUTH EVERY DAY 6PM   carbamazepine 300 MG 12 hr capsule Commonly known as: CARBATROL TAKE 1 CAPSULE BY MOUTH TWICE A DAY   cloNIDine 0.2 MG tablet Commonly known as: CATAPRES TAKE 1 TABLET(0.2 MG) BY MOUTH TWICE DAILY   escitalopram 5 MG tablet Commonly known as: LEXAPRO TAKE 1 TABLET BY MOUTH EVERYDAY AT BEDTIME   furosemide 20 MG tablet Commonly known as: LASIX Take 0.5 tablets (10 mg total) by mouth daily.   glucose blood test strip 1 each by Other route as needed. Use as instructed   glucose monitoring kit monitoring kit 1 each by Does not apply route as needed.   isosorbide mononitrate 60 MG 24 hr tablet Commonly known as: IMDUR Take 0.5 tablets (30 mg total) by mouth daily. What changed: how much to take   levETIRAcetam 250 MG tablet Commonly known as: KEPPRA Take 1 tablet (250 mg total) by mouth 2 (two) times daily.   levothyroxine 75 MCG tablet Commonly known as: SYNTHROID TAKE 1 TABLET(75 MCG) BY MOUTH DAILY   losartan 100 MG tablet Commonly known as: COZAAR Take 0.5 tablets (50 mg total) by mouth daily. What changed: how much to take   metoprolol tartrate 100 MG tablet Commonly known as: LOPRESSOR TAKE 1 TABLET(100 MG) BY MOUTH TWICE DAILY   multivitamins  ther. w/minerals Tabs tablet Take 1 tablet by mouth at bedtime.   tamsulosin 0.4 MG Caps capsule Commonly known as: FLOMAX Take 1 capsule (0.4 mg total) by mouth daily after supper.   warfarin 7.5 MG tablet Commonly known as: COUMADIN Take as directed. If you are unsure how to take this medication, talk to your nurse or doctor. Original instructions: TAKE 1&1/2 TABS TUESDAY, THURSDAY & SATURDAY*1 TAB ON SUNDAY, MONDAY, WEDNESDAY & FRIDAY WITH SUPPER       Follow-up Information    [facility] Follow up.   Contact information: [address] [place] [phone]       [name], MD. Schedule an appointment as soon as possible for a visit.   Specialty: Family Medicine Contact information: [address] [place] [address] [phone]        Minus Breeding, MD .   Specialty: Cardiology Contact information: [address] [place] [address] [phone]              Allergies  Allergen Reactions  . Penicillins     Unknown reaction    Consultations:  None  Procedures/Studies: DG  Chest 2 View  Result Date: 06/29/2020 CLINICAL DATA:  Weakness EXAM: CHEST - 2 VIEW COMPARISON:  08/19/2018, 09/06/2016 FINDINGS: No focal opacity or pleural effusion. Stable cardiomediastinal silhouette with aortic atherosclerosis. No pneumothorax. IMPRESSION: No active cardiopulmonary disease. Electronically Signed   By: Donavan Foil M.D.   On: 06/29/2020 20:58   CT Head Wo Contrast  Result Date: 06/29/2020 CLINICAL DATA:  Mental status change.  Weakness EXAM: CT HEAD WITHOUT CONTRAST TECHNIQUE: Contiguous axial images were obtained from the base of the skull through the vertex without intravenous contrast. COMPARISON:  09/18/2019 FINDINGS: Brain: There is no mass, hemorrhage or extra-axial collection. There is generalized atrophy without lobar predilection. Hypodensity of the white matter is most commonly  associated with chronic microvascular disease. Old small vessel infarcts of the left thalamus and right centrum semiovale. Vascular: Atherosclerotic calcification of the vertebral and internal carotid arteries at the skull base. No abnormal hyperdensity of the major intracranial arteries or dural venous sinuses. Skull: The visualized skull base, calvarium and extracranial soft tissues are normal. Sinuses/Orbits: No fluid levels or advanced mucosal thickening of the visualized paranasal sinuses. No mastoid or middle ear effusion. The orbits are normal. IMPRESSION: 1. No acute intracranial abnormality. 2. Generalized atrophy and chronic microvascular ischemia. 3. Old small vessel infarcts of the left thalamus and right centrum semiovale. Electronically Signed   By: Ulyses Jarred M.D.   On: 06/29/2020 21:50   CT Lumbar Spine Wo Contrast  Result Date: 06/29/2020 CLINICAL DATA:  Bilateral leg weakness with low back pain EXAM: CT LUMBAR SPINE WITHOUT CONTRAST TECHNIQUE: Multidetector CT imaging of the lumbar spine was performed without intravenous contrast administration. Multiplanar CT image reconstructions were also generated. COMPARISON:  None. FINDINGS: Segmentation: Normal Alignment: Grade 1 retrolisthesis at L3-4 Vertebrae: No acute fracture or focal pathologic process. Paraspinal and other soft tissues: Calcific aortic atherosclerosis. Left nephrolithiasis. Disc levels: L1-2: Disc space narrowing without herniation. No spinal canal stenosis. Facet hypertrophy with mild bilateral foraminal stenosis. L2-3: Disc space narrowing with endplate spurring. Mild facet hypertrophy. Mild spinal canal stenosis. Severe right and moderate left foraminal stenosis. L3-4: Right asymmetric disc bulge with endplate spurring and mild facet hypertrophy. No spinal canal stenosis. Mild bilateral foraminal stenosis. L4-5: Right asymmetric disc bulge with endplate osteophytes. Moderate facet hypertrophy. Mild spinal canal stenosis.  Severe right and moderate left foraminal stenosis. L5-S1: Left asymmetric disc bulge with endplate spurring. Facet hypertrophy. No spinal canal stenosis. Moderate right and severe left neural foraminal stenosis. IMPRESSION: 1. No acute fracture or static subluxation of the lumbar spine. 2. Mild spinal canal stenosis at L2-3 and L4-5. 3. Multilevel moderate-to-severe neural foraminal stenosis. Aortic Atherosclerosis (ICD10-I70.0). Electronically Signed   By: Ulyses Jarred M.D.   On: 06/29/2020 21:55   [name]  Result Date: 06/30/2020 CLINICAL DATA:  87 year old male with atrial fibrillation on Coumadin. Altered mental status. Prior stroke with right hand weakness. New bilateral lower extremity weakness for 3 or 4 days. EXAM: MRI HEAD WITHOUT CONTRAST TECHNIQUE: Multiplanar, multiecho pulse sequences of the brain and surrounding structures were obtained without intravenous contrast. COMPARISON:  Head CT yesterday.  Brain MRI 09/19/2019 and earlier. FINDINGS: Brain: Chronic left PCA territory ischemia including prominent chronic lacunar infarct of the left thalamus. No restricted diffusion to suggest acute infarction. No midline shift, mass effect, evidence of mass lesion, ventriculomegaly, extra-axial collection or acute intracranial hemorrhage. Cervicomedullary junction and pituitary are within normal limits. Mild progression of patchy white matter T2 and FLAIR hyperintensity in the right  corona radiata since last year. Superimposed chronic T2 heterogeneity in the bilateral basal ganglia and pons also seems slightly progressed. But otherwise stable gray and white matter signal, including chronic microhemorrhage in the left dorsal pons. Vascular: Major intracranial vascular flow voids appear stable since last year. Skull and upper cervical spine: Multilevel degenerative changes in the visible cervical spine, evidence of mild degenerative spinal stenosis at C4-C5 on series 9, image 12, not included  previously. Background bone marrow signal within normal limits. Sinuses/Orbits: Trace new paranasal sinus mucosal thickening. Stable and negative orbits. Other: Mastoids remain well aerated. Grossly normal visible internal auditory structures. Negative visible scalp and face. IMPRESSION: 1. No acute intracranial abnormality. 2. Mild progression of bilateral cerebral white matter and basal ganglia chronic small vessel disease since last year. Stable chronic left PCA territory ischemia with involvement of the thalamus. 3. Cervical spine degeneration with mild spinal stenosis suspected at C4-C5. If there is myelopathy on physical exam then dedicated cervical spine MRI may be valuable. Electronically Signed   By: Genevie Ann M.D.   On: 06/30/2020 04:24   MR LUMBAR SPINE WO CONTRAST  Result Date: 06/30/2020 CLINICAL DATA:  87 year old male with atrial fibrillation on Coumadin. Altered mental status. Prior stroke with right hand weakness. New bilateral lower extremity weakness for 3 or 4 days. EXAM: MRI LUMBAR SPINE WITHOUT CONTRAST TECHNIQUE: Multiplanar, multisequence MR imaging of the lumbar spine was performed. No intravenous contrast was administered. COMPARISON:  Lumbar spine CT yesterday. CT Abdomen and Pelvis 10/17/2015. FINDINGS: Segmentation:  Normal on the CT yesterday. Alignment: Mild to moderate levoconvex lumbar scoliosis. Lumbar lordosis has not significantly changed since 2017. Vertebrae: Marrow edema in the L1 and L2 endplates eccentric to the left, with associated abnormal increased STIR signal in the disc space and mild edema in the medial left psoas muscle (series 15, image 16). However, vacuum phenomena within that disc space by CT has not significantly changed since 2017. And the right lateral endplates and other paraspinal soft tissues are spared. Schmorl's node related marrow edema in the T12 inferior endplate. Chronic degenerative endplate marrow signal changes elsewhere. Background bone marrow  signal within normal limits. Degenerative sclerosis anterior right sacral ala is chronic. Intact visible sacrum and SI joints. Conus medullaris and cauda equina: Conus extends to the T12-L1 level. No signal abnormality in the visible lower thoracic cord or conus. Spinal stenosis just below the conus at L1-L2 detailed below. Paraspinal and other soft tissues: Paraspinal soft tissues are negative aside from mild edema in the medial left psoas muscle stated earlier. Stable visible abdominal viscera. Moderate distension of the urinary bladder with partially visible bladder wall thickening. Disc levels: No lower thoracic spinal stenosis through T12-L1. L1-L2: Moderate to severe multifactorial spinal stenosis with disc space loss, circumferential disc osteophyte complex and mild to moderate facet and ligament flavum hypertrophy greater on the left. Moderate associated left lateral recess stenosis (left L2 nerve level). Moderate to severe left and mild to moderate right L1 foraminal stenosis. L2-L3: Disc space loss with multifactorial moderate spinal and left greater than right lateral recess stenosis. Circumferential disc osteophyte complex and moderate posterior element hypertrophy. Severe bilateral L2 foraminal stenosis. L3-L4: Disc space loss with circumferential disc bulge. Lesser endplate spurring. Mild to moderate facet and ligament flavum hypertrophy. Mild to moderate spinal and bilateral lateral recess stenosis. Moderate bilateral L3 foraminal stenosis. L4-L5: Moderate to severe multifactorial spinal stenosis with circumferential disc osteophyte complex and up to moderate facet and ligament flavum hypertrophy greater on the  right. Moderate to severe bilateral lateral recess and foraminal stenosis (L5 and L4 nerve levels, respectively). L5-S1: Disc space loss with bulky far lateral disc osteophyte complex. Mild to moderate facet hypertrophy. No spinal or lateral recess stenosis. Moderate to severe left and moderate  right L5 foraminal stenosis. IMPRESSION: 1. Inflammatory signal in the L1-L2 disc, left lateral endplates, and the medial left psoas muscle. But in conjunction with the recent CT appearance this is more compatible with acute exacerbation of chronic disc and endplate degeneration than an infectious discitis/osteomyelitis. Query fever. Blood cultures recommended if the clinical picture is equivocal for infection. Subsequent moderate to severe spinal, moderate left lateral recess and moderate to severe left greater than right foraminal stenosis. 2. Lumbar spine degeneration elsewhere with multifactorial spinal stenosis L3-L4 (moderate) through L4-L5 (severe). At least moderate associated lateral recess and foraminal stenosis at each level. Moderate to severe left greater than right L5-S1 foraminal stenosis. 3. Distended, thick-walled urinary bladder. Query bladder outlet obstruction or bladder retention. Electronically Signed   By: Genevie Ann M.D.   On: 06/30/2020 04:36    Subjective: Patient was seen and examined today.  Sitting comfortably in chair.  Thinks that he is back to his baseline.  No new complaints.  Daughter at bedside.  We discussed about seeing the urologist if he continued to have difficulty with urination and dribbling.  Discharge Exam: Vitals:   07/01/20 0854 07/01/20 1319  BP: 126/64 (!) 83/43  Pulse:  (!) 51  Resp:  17  Temp:  98.2 F (36.8 C)  SpO2:  95%   Vitals:   06/30/20 2023 07/01/20 0549 07/01/20 0854 07/01/20 1319  BP: (!) 150/69 (!) 105/52 126/64 (!) 83/43  Pulse: (!) 52 62  (!) 51  Resp: 16 16  17   Temp: 98.4 F (36.9 C) 98.2 F (36.8 C)  98.2 F (36.8 C)  TempSrc:  Oral    SpO2: 99% 95%  95%  Weight:      Height:        General: Pt is alert, awake, not in acute distress Cardiovascular: RRR, S1/S2 +, no rubs, no gallops Respiratory: CTA bilaterally, no wheezing, no rhonchi Abdominal: Soft, NT, ND, bowel sounds + Extremities: no edema, no cyanosis   The  results of significant diagnostics from this hospitalization (including imaging, microbiology, ancillary and laboratory) are listed below for reference.    Microbiology: Recent Results (from the past 240 hour(s))  Resp Panel by RT-PCR (Flu A&B, Covid) Nasopharyngeal Swab     Status: None   Collection Time: 06/29/20  8:00 PM   Specimen: Nasopharyngeal Swab; Nasopharyngeal(NP) swabs in vial transport medium  Result Value Ref Range Status   SARS Coronavirus 2 by RT PCR NEGATIVE NEGATIVE Final    Comment: (NOTE) SARS-CoV-2 target nucleic acids are NOT DETECTED.  The SARS-CoV-2 RNA is generally detectable in upper respiratory specimens during the acute phase of infection. The lowest concentration of SARS-CoV-2 viral copies this assay can detect is 138 copies/mL. A negative result does not preclude SARS-Cov-2 infection and should not be used as the sole basis for treatment or other patient management decisions. A negative result may occur with  improper specimen collection/handling, submission of specimen other than nasopharyngeal swab, presence of viral mutation(s) within the areas targeted by this assay, and inadequate number of viral copies(<138 copies/mL). A negative result must be combined with clinical observations, patient history, and epidemiological information. The expected result is Negative.  Fact Sheet for Patients:  EntrepreneurPulse.com.au  Fact Sheet for  Healthcare Providers:  IncredibleEmployment.be  This test is no t yet approved or cleared by the [place] and  has been authorized for detection and/or diagnosis of SARS-CoV-2 by FDA under an Emergency Use Authorization (EUA). This EUA will remain  in effect (meaning this test can be used) for the duration of the COVID-19 declaration under Section 564(b)(1) of the Act, 21 U.S.C.section 360bbb-3(b)(1), unless the authorization is terminated  or revoked sooner.        Influenza A by PCR NEGATIVE NEGATIVE Final   Influenza B by PCR NEGATIVE NEGATIVE Final    Comment: (NOTE) The Xpert Xpress SARS-CoV-2/FLU/RSV plus assay is intended as an aid in the diagnosis of influenza from Nasopharyngeal swab specimens and should not be used as a sole basis for treatment. Nasal washings and aspirates are unacceptable for Xpert Xpress SARS-CoV-2/FLU/RSV testing.  Fact Sheet for Patients: EntrepreneurPulse.com.au  Fact Sheet for Healthcare Providers: IncredibleEmployment.be  This test is not yet approved or cleared by the [place] FDA and has been authorized for detection and/or diagnosis of SARS-CoV-2 by FDA under an Emergency Use Authorization (EUA). This EUA will remain in effect (meaning this test can be used) for the duration of the COVID-19 declaration under Section 564(b)(1) of the Act, 21 U.S.C. section 360bbb-3(b)(1), unless the authorization is terminated or revoked.  Performed at [facility], [address] [place], [place] [address]   Culture, blood (single)     Status: None (Preliminary result)   Collection Time: 06/29/20  9:00 PM   Specimen: BLOOD LEFT FOREARM  Result Value Ref Range Status   Specimen Description BLOOD LEFT FOREARM  Final   Special Requests   Final    BOTTLES DRAWN AEROBIC AND ANAEROBIC Blood Culture adequate volume   Culture   Final    NO GROWTH 2 DAYS Performed at [facility], [address], [place], [place] [address]    Report Status PENDING  Incomplete  Culture, blood (routine x 2)     Status: None (Preliminary result)   Collection Time: 06/30/20  8:36 AM   Specimen: BLOOD RIGHT HAND  Result Value Ref Range Status   Specimen Description BLOOD RIGHT HAND  Final   Special Requests   Final    BOTTLES DRAWN AEROBIC AND ANAEROBIC Blood Culture adequate volume   Culture   Final    NO GROWTH < 24 HOURS Performed at [facility], [place] [address] [place], [place] [address]     Report Status PENDING  Incomplete  Culture, blood (routine x 2)     Status: None (Preliminary result)   Collection Time: 06/30/20  8:38 AM   Specimen: BLOOD RIGHT HAND  Result Value Ref Range Status   Specimen Description BLOOD RIGHT HAND  Final   Special Requests   Final    BOTTLES DRAWN AEROBIC AND ANAEROBIC Blood Culture results may not be optimal due to an excessive volume of blood received in culture bottles   Culture   Final    NO GROWTH < 24 HOURS Performed at [facility], [place] [address], [place], [place] [address]    Report Status PENDING  Incomplete     Labs: BNP (last 3 results) No results for input(s): BNP in the last 8760 hours. Basic Metabolic Panel: Recent Labs  Lab 06/29/20 2100  NA 132*  K 3.9  CL 97*  CO2 25  GLUCOSE 108*  BUN 18  CREATININE 0.85  CALCIUM 8.6*  MG 2.0   Liver Function  Tests: Recent Labs  Lab 06/29/20 2100  AST 23  ALT 21  ALKPHOS 63  BILITOT 0.9  PROT 6.6  ALBUMIN 4.0   No results for input(s): LIPASE, AMYLASE in the last 168 hours. No results for input(s): AMMONIA in the last 168 hours. CBC: Recent Labs  Lab 06/29/20 2100 06/30/20 0838 07/01/20 0215  WBC 8.0 7.7 8.0  NEUTROABS 4.5  --   --   HGB 12.4* 12.6* 11.3*  HCT 37.4* 37.3* 33.7*  MCV 94.9 93.0 94.1  PLT 214 212 214   Cardiac Enzymes: Recent Labs  Lab 06/30/20 0200  CKTOTAL 95   BNP: Invalid input(s): POCBNP CBG: Recent Labs  Lab 06/30/20 1121 06/30/20 1710 06/30/20 2224 07/01/20 0728 07/01/20 1150  GLUCAP 192* 102* 145* 111* 185*   D-Dimer No results for input(s): DDIMER in the last 72 hours. Hgb A1c Recent Labs    06/30/20 0838  HGBA1C 6.3*   Lipid Profile No results for input(s): CHOL, HDL, LDLCALC, TRIG, CHOLHDL, LDLDIRECT in the last 72 hours. Thyroid function studies Recent Labs    06/29/20 2100  TSH 2.885   Anemia work up No results for input(s): VITAMINB12, FOLATE, FERRITIN, TIBC, IRON, RETICCTPCT in the last 72  hours. Urinalysis    Component Value Date/Time   COLORURINE YELLOW 06/29/2020 2132   APPEARANCEUR CLEAR 06/29/2020 2132   LABSPEC 1.010 06/29/2020 2132   PHURINE 7.0 06/29/2020 2132   GLUCOSEU NEGATIVE 06/29/2020 2132   HGBUR SMALL (A) 06/29/2020 2132   BILIRUBINUR NEGATIVE 06/29/2020 2132   KETONESUR NEGATIVE 06/29/2020 2132   PROTEINUR NEGATIVE 06/29/2020 2132   UROBILINOGEN 0.2 10/22/2013 1451   NITRITE NEGATIVE 06/29/2020 2132   LEUKOCYTESUR NEGATIVE 06/29/2020 2132   Sepsis Labs Invalid input(s): PROCALCITONIN,  WBC,  LACTICIDVEN Microbiology Recent Results (from the past 240 hour(s))  Resp Panel by RT-PCR (Flu A&B, Covid) Nasopharyngeal Swab     Status: None   Collection Time: 06/29/20  8:00 PM   Specimen: Nasopharyngeal Swab; Nasopharyngeal(NP) swabs in vial transport medium  Result Value Ref Range Status   SARS Coronavirus 2 by RT PCR NEGATIVE NEGATIVE Final    Comment: (NOTE) SARS-CoV-2 target nucleic acids are NOT DETECTED.  The SARS-CoV-2 RNA is generally detectable in upper respiratory specimens during the acute phase of infection. The lowest concentration of SARS-CoV-2 viral copies this assay can detect is 138 copies/mL. A negative result does not preclude SARS-Cov-2 infection and should not be used as the sole basis for treatment or other patient management decisions. A negative result may occur with  improper specimen collection/handling, submission of specimen other than nasopharyngeal swab, presence of viral mutation(s) within the areas targeted by this assay, and inadequate number of viral copies(<138 copies/mL). A negative result must be combined with clinical observations, patient history, and epidemiological information. The expected result is Negative.  Fact Sheet for Patients:  EntrepreneurPulse.com.au  Fact Sheet for Healthcare Providers:  IncredibleEmployment.be  This test is no t yet approved or cleared by  the [place] FDA and  has been authorized for detection and/or diagnosis of SARS-CoV-2 by FDA under an Emergency Use Authorization (EUA). This EUA will remain  in effect (meaning this test can be used) for the duration of the COVID-19 declaration under Section 564(b)(1) of the Act, 21 U.S.C.section 360bbb-3(b)(1), unless the authorization is terminated  or revoked sooner.       Influenza A by PCR NEGATIVE NEGATIVE Final   Influenza B by PCR NEGATIVE NEGATIVE Final    Comment: (NOTE) The  Xpert Xpress SARS-CoV-2/FLU/RSV plus assay is intended as an aid in the diagnosis of influenza from Nasopharyngeal swab specimens and should not be used as a sole basis for treatment. Nasal washings and aspirates are unacceptable for Xpert Xpress SARS-CoV-2/FLU/RSV testing.  Fact Sheet for Patients: EntrepreneurPulse.com.au  Fact Sheet for Healthcare Providers: IncredibleEmployment.be  This test is not yet approved or cleared by the [place] FDA and has been authorized for detection and/or diagnosis of SARS-CoV-2 by FDA under an Emergency Use Authorization (EUA). This EUA will remain in effect (meaning this test can be used) for the duration of the COVID-19 declaration under Section 564(b)(1) of the Act, 21 U.S.C. section 360bbb-3(b)(1), unless the authorization is terminated or revoked.  Performed at [facility], [address] [place], [place] [address]   Culture, blood (single)     Status: None (Preliminary result)   Collection Time: 06/29/20  9:00 PM   Specimen: BLOOD LEFT FOREARM  Result Value Ref Range Status   Specimen Description BLOOD LEFT FOREARM  Final   Special Requests   Final    BOTTLES DRAWN AEROBIC AND ANAEROBIC Blood Culture adequate volume   Culture   Final    NO GROWTH 2 DAYS Performed at [facility], [address], [place], [place] [address]    Report Status PENDING  Incomplete  Culture, blood (routine x 2)     Status:  None (Preliminary result)   Collection Time: 06/30/20  8:36 AM   Specimen: BLOOD RIGHT HAND  Result Value Ref Range Status   Specimen Description BLOOD RIGHT HAND  Final   Special Requests   Final    BOTTLES DRAWN AEROBIC AND ANAEROBIC Blood Culture adequate volume   Culture   Final    NO GROWTH < 24 HOURS Performed at [facility], [place] [address] [place], [place] [address]    Report Status PENDING  Incomplete  Culture, blood (routine x 2)     Status: None (Preliminary result)   Collection Time: 06/30/20  8:38 AM   Specimen: BLOOD RIGHT HAND  Result Value Ref Range Status   Specimen Description BLOOD RIGHT HAND  Final   Special Requests   Final    BOTTLES DRAWN AEROBIC AND ANAEROBIC Blood Culture results may not be optimal due to an excessive volume of blood received in culture bottles   Culture   Final    NO GROWTH < 24 HOURS Performed at [facility], [place] [address], [place], [place] [address]    Report Status PENDING  Incomplete    Time coordinating discharge: Over 30 minutes  SIGNED:  Lorella Nimrod, MD  [facility] 07/01/2020, 1:32 PM  If 7PM-7AM, please contact night-coverage [email or web address]  This record has been created using Systems analyst. Errors have been sought and corrected,but may not always be located. Such creation errors do not reflect on the standard of care.

## 2020-07-01 NOTE — Progress Notes (Signed)
Physical Therapy Treatment Patient Details Name: [name] MRN: [id number] DOB: Feb 01, 1934 Today's Date: 07/01/2020    History of Present Illness 87 yo male presents to [facility] on 4/10 with progressively worsening LE weakness. Imaging shows spinal stenosis, possible acute disc disease L1-2 vs discitis. PMH includes aflutter, BPH, CAD s/p MI and stenting, DM , HLD, HTN, meniere's disease, RTC repair, seizures, CVA with residual R hand weakness, HF.    PT Comments    Pt incontinent of urine upon PT arrival to room, requires min assist to clean and wash up. Pt ambulatory for hallway distance today, overall requiring min guard for safety. Pt's daughter present during session, expresses concerns about pt safety and fall risk at d/c. PT encouraged pt/family to have someone with pt when he is up and moving, assist with toileting as needed, and meal prep from family may be beneficial from an energy conservation standpoint. [name] states someone will be with pt whenever he is up and moving at home. PT to continue to follow acutely.    Follow Up Recommendations  Home health PT;Supervision for mobility/OOB     Equipment Recommendations  None recommended by PT    Recommendations for Other Services       Precautions / Restrictions Precautions Precautions: Fall Precaution Comments: reports "many falls" in the past few years Restrictions Weight Bearing Restrictions: No    Mobility  Bed Mobility Overal bed mobility: Needs Assistance Bed Mobility: Supine to Sit     Supine to sit: Min guard     General bed mobility comments: for safety, intermittent cues for safety    Transfers Overall transfer level: Needs assistance Equipment used: Rolling walker (2 wheeled) Transfers: Sit to/from Stand Sit to Stand: Min guard         General transfer comment: for safety, vc for hand placement when rising/sitting  Ambulation/Gait Ambulation/Gait assistance: Min guard Gait Distance  (Feet): 80 Feet Assistive device: Rolling walker (2 wheeled) Gait Pattern/deviations: Step-through pattern;Decreased stride length;Trunk flexed Gait velocity: decr   General Gait Details: min guard for safety, verbal cuing for upright posture, placement in RW, not lifting RW off ground   Stairs             Wheelchair Mobility    Modified Rankin (Stroke Patients Only)       Balance Overall balance assessment: Needs assistance Sitting-balance support: No upper extremity supported;Feet supported Sitting balance-Leahy Scale: Fair     Standing balance support: Bilateral upper extremity supported;No upper extremity supported;During functional activity Standing balance-Leahy Scale: Poor Standing balance comment: relaint on UE support                            Cognition Arousal/Alertness: Awake/alert Behavior During Therapy: WFL for tasks assessed/performed Overall Cognitive Status: Within Functional Limits for tasks assessed                                 General Comments: pt incontinent of urine upon PT arrival to room, per pt's daughter this is probably because pt is embarrassed not because of cognition. Increased processing time to follow commands still present today      Exercises      General Comments        Pertinent Vitals/Pain Pain Assessment: Faces Faces Pain Scale: Hurts a little bit Pain Location: LEs Pain Descriptors / Indicators: Other (Comment);Tiring (weakness) Pain Intervention(s):  Limited activity within patient's tolerance;Monitored during session;Repositioned;Relaxation    Home Living                      Prior Function            PT Goals (current goals can now be found in the care plan section) Acute Rehab PT Goals Patient Stated Goal: go home, get my legs stronger PT Goal Formulation: With patient Time For Goal Achievement: 07/14/20 Potential to Achieve Goals: Good Progress towards PT goals:  Progressing toward goals    Frequency    Min 3X/week      PT Plan Current plan remains appropriate    Co-evaluation              AM-PAC PT "6 Clicks" Mobility   Outcome Measure  Help needed turning from your back to your side while in a flat bed without using bedrails?: A Little Help needed moving from lying on your back to sitting on the side of a flat bed without using bedrails?: A Little Help needed moving to and from a bed to a chair (including a wheelchair)?: A Little Help needed standing up from a chair using your arms (e.g., wheelchair or bedside chair)?: A Little Help needed to walk in hospital room?: A Little Help needed climbing 3-5 steps with a railing? : A Little 6 Click Score: 18    End of Session   Activity Tolerance: Patient tolerated treatment well Patient left: in chair;with chair alarm set;with call bell/phone within reach;with family/visitor present Nurse Communication: Mobility status PT Visit Diagnosis: Other abnormalities of gait and mobility (R26.89);Difficulty in walking, not elsewhere classified (R26.2)     Time: 1157-2620 PT Time Calculation (min) (ACUTE ONLY): 38 min  Charges:  $Gait Training: 8-22 mins $Self Care/Home Management: 8-22                    Stacie Glaze, PT Acute Rehabilitation Services Pager [phone]  Office [phone]   Louis Matte 07/01/2020, 5:23 PM

## 2020-07-01 NOTE — Discharge Instructions (Signed)
You are on multiple different medications which affect your blood pressure. I am decreasing the dose of losartan and Imdur, keep an eye on your blood pressure and follow-up closely with your primary care provider and they can titrate the dose as needed. Keep yourself well-hydrated

## 2020-07-02 ENCOUNTER — Telehealth: Payer: Self-pay | Admitting: *Deleted

## 2020-07-02 DIAGNOSIS — I5032 Chronic diastolic (congestive) heart failure: Secondary | ICD-10-CM | POA: Diagnosis not present

## 2020-07-02 DIAGNOSIS — I4892 Unspecified atrial flutter: Secondary | ICD-10-CM | POA: Diagnosis not present

## 2020-07-02 DIAGNOSIS — I252 Old myocardial infarction: Secondary | ICD-10-CM | POA: Diagnosis not present

## 2020-07-02 DIAGNOSIS — I69331 Monoplegia of upper limb following cerebral infarction affecting right dominant side: Secondary | ICD-10-CM | POA: Diagnosis not present

## 2020-07-02 DIAGNOSIS — M5116 Intervertebral disc disorders with radiculopathy, lumbar region: Secondary | ICD-10-CM | POA: Diagnosis not present

## 2020-07-02 DIAGNOSIS — I480 Paroxysmal atrial fibrillation: Secondary | ICD-10-CM | POA: Diagnosis not present

## 2020-07-02 DIAGNOSIS — M48061 Spinal stenosis, lumbar region without neurogenic claudication: Secondary | ICD-10-CM | POA: Diagnosis not present

## 2020-07-02 DIAGNOSIS — E039 Hypothyroidism, unspecified: Secondary | ICD-10-CM | POA: Diagnosis not present

## 2020-07-02 DIAGNOSIS — I110 Hypertensive heart disease with heart failure: Secondary | ICD-10-CM | POA: Diagnosis not present

## 2020-07-02 DIAGNOSIS — I2510 Atherosclerotic heart disease of native coronary artery without angina pectoris: Secondary | ICD-10-CM | POA: Diagnosis not present

## 2020-07-02 DIAGNOSIS — E119 Type 2 diabetes mellitus without complications: Secondary | ICD-10-CM | POA: Diagnosis not present

## 2020-07-02 NOTE — Telephone Encounter (Signed)
Received call from [facility] with [place] [phone]~ telephone.   Reports that patient was discharged from hospital with orders for [facility] evaluation.   Requested OV for [facility] 2x weekly x2 weeks, then 1x weekly x3 weeks for medication education and CHF management.   VO given.

## 2020-07-04 LAB — CULTURE, BLOOD (SINGLE)
Culture: NO GROWTH
Special Requests: ADEQUATE

## 2020-07-05 DIAGNOSIS — I5032 Chronic diastolic (congestive) heart failure: Secondary | ICD-10-CM | POA: Diagnosis not present

## 2020-07-05 DIAGNOSIS — E039 Hypothyroidism, unspecified: Secondary | ICD-10-CM | POA: Diagnosis not present

## 2020-07-05 DIAGNOSIS — I69331 Monoplegia of upper limb following cerebral infarction affecting right dominant side: Secondary | ICD-10-CM | POA: Diagnosis not present

## 2020-07-05 DIAGNOSIS — M5116 Intervertebral disc disorders with radiculopathy, lumbar region: Secondary | ICD-10-CM | POA: Diagnosis not present

## 2020-07-05 DIAGNOSIS — I252 Old myocardial infarction: Secondary | ICD-10-CM | POA: Diagnosis not present

## 2020-07-05 DIAGNOSIS — I480 Paroxysmal atrial fibrillation: Secondary | ICD-10-CM | POA: Diagnosis not present

## 2020-07-05 DIAGNOSIS — M48061 Spinal stenosis, lumbar region without neurogenic claudication: Secondary | ICD-10-CM | POA: Diagnosis not present

## 2020-07-05 DIAGNOSIS — I4892 Unspecified atrial flutter: Secondary | ICD-10-CM | POA: Diagnosis not present

## 2020-07-05 DIAGNOSIS — E119 Type 2 diabetes mellitus without complications: Secondary | ICD-10-CM | POA: Diagnosis not present

## 2020-07-05 DIAGNOSIS — I2510 Atherosclerotic heart disease of native coronary artery without angina pectoris: Secondary | ICD-10-CM | POA: Diagnosis not present

## 2020-07-05 DIAGNOSIS — I110 Hypertensive heart disease with heart failure: Secondary | ICD-10-CM | POA: Diagnosis not present

## 2020-07-05 LAB — CULTURE, BLOOD (ROUTINE X 2)
Culture: NO GROWTH
Culture: NO GROWTH
Special Requests: ADEQUATE

## 2020-07-07 ENCOUNTER — Inpatient Hospital Stay (HOSPITAL_COMMUNITY)
Admission: EM | Admit: 2020-07-07 | Discharge: 2020-07-11 | DRG: 312 | Disposition: A | Discharge status: Home Health | Payer: Medicare Other | Attending: Family Medicine | Admitting: Family Medicine

## 2020-07-07 ENCOUNTER — Inpatient Hospital Stay: Payer: Medicare Other | Admitting: Family Medicine

## 2020-07-07 ENCOUNTER — Other Ambulatory Visit: Payer: Self-pay

## 2020-07-07 ENCOUNTER — Other Ambulatory Visit: Payer: Self-pay | Admitting: Family Medicine

## 2020-07-07 ENCOUNTER — Telehealth: Payer: Self-pay | Admitting: Family Medicine

## 2020-07-07 DIAGNOSIS — I10 Essential (primary) hypertension: Secondary | ICD-10-CM | POA: Diagnosis present

## 2020-07-07 DIAGNOSIS — I110 Hypertensive heart disease with heart failure: Secondary | ICD-10-CM | POA: Diagnosis not present

## 2020-07-07 DIAGNOSIS — I441 Atrioventricular block, second degree: Secondary | ICD-10-CM

## 2020-07-07 DIAGNOSIS — Z8673 Personal history of transient ischemic attack (TIA), and cerebral infarction without residual deficits: Secondary | ICD-10-CM

## 2020-07-07 DIAGNOSIS — M545 Low back pain, unspecified: Secondary | ICD-10-CM | POA: Diagnosis not present

## 2020-07-07 DIAGNOSIS — I480 Paroxysmal atrial fibrillation: Secondary | ICD-10-CM | POA: Diagnosis not present

## 2020-07-07 DIAGNOSIS — I517 Cardiomegaly: Secondary | ICD-10-CM | POA: Diagnosis not present

## 2020-07-07 DIAGNOSIS — Z955 Presence of coronary angioplasty implant and graft: Secondary | ICD-10-CM

## 2020-07-07 DIAGNOSIS — Z79899 Other long term (current) drug therapy: Secondary | ICD-10-CM

## 2020-07-07 DIAGNOSIS — Z8249 Family history of ischemic heart disease and other diseases of the circulatory system: Secondary | ICD-10-CM

## 2020-07-07 DIAGNOSIS — R42 Dizziness and giddiness: Secondary | ICD-10-CM | POA: Diagnosis not present

## 2020-07-07 DIAGNOSIS — I4892 Unspecified atrial flutter: Secondary | ICD-10-CM | POA: Diagnosis not present

## 2020-07-07 DIAGNOSIS — I2510 Atherosclerotic heart disease of native coronary artery without angina pectoris: Secondary | ICD-10-CM | POA: Diagnosis not present

## 2020-07-07 DIAGNOSIS — E785 Hyperlipidemia, unspecified: Secondary | ICD-10-CM | POA: Diagnosis present

## 2020-07-07 DIAGNOSIS — Z87891 Personal history of nicotine dependence: Secondary | ICD-10-CM | POA: Diagnosis not present

## 2020-07-07 DIAGNOSIS — I447 Left bundle-branch block, unspecified: Secondary | ICD-10-CM | POA: Diagnosis not present

## 2020-07-07 DIAGNOSIS — N401 Enlarged prostate with lower urinary tract symptoms: Secondary | ICD-10-CM | POA: Diagnosis present

## 2020-07-07 DIAGNOSIS — Z20822 Contact with and (suspected) exposure to covid-19: Secondary | ICD-10-CM | POA: Diagnosis not present

## 2020-07-07 DIAGNOSIS — E119 Type 2 diabetes mellitus without complications: Secondary | ICD-10-CM | POA: Diagnosis not present

## 2020-07-07 DIAGNOSIS — I361 Nonrheumatic tricuspid (valve) insufficiency: Secondary | ICD-10-CM | POA: Diagnosis not present

## 2020-07-07 DIAGNOSIS — G910 Communicating hydrocephalus: Secondary | ICD-10-CM | POA: Diagnosis not present

## 2020-07-07 DIAGNOSIS — G40909 Epilepsy, unspecified, not intractable, without status epilepticus: Secondary | ICD-10-CM | POA: Diagnosis present

## 2020-07-07 DIAGNOSIS — R059 Cough, unspecified: Secondary | ICD-10-CM | POA: Diagnosis not present

## 2020-07-07 DIAGNOSIS — M48061 Spinal stenosis, lumbar region without neurogenic claudication: Secondary | ICD-10-CM | POA: Diagnosis present

## 2020-07-07 DIAGNOSIS — R55 Syncope and collapse: Principal | ICD-10-CM | POA: Diagnosis present

## 2020-07-07 DIAGNOSIS — K649 Unspecified hemorrhoids: Secondary | ICD-10-CM | POA: Diagnosis present

## 2020-07-07 DIAGNOSIS — N39490 Overflow incontinence: Secondary | ICD-10-CM | POA: Diagnosis present

## 2020-07-07 DIAGNOSIS — R001 Bradycardia, unspecified: Secondary | ICD-10-CM | POA: Diagnosis not present

## 2020-07-07 DIAGNOSIS — R531 Weakness: Secondary | ICD-10-CM | POA: Diagnosis present

## 2020-07-07 DIAGNOSIS — Z833 Family history of diabetes mellitus: Secondary | ICD-10-CM

## 2020-07-07 DIAGNOSIS — Z79890 Hormone replacement therapy (postmenopausal): Secondary | ICD-10-CM

## 2020-07-07 DIAGNOSIS — Z8614 Personal history of Methicillin resistant Staphylococcus aureus infection: Secondary | ICD-10-CM | POA: Diagnosis not present

## 2020-07-07 DIAGNOSIS — E118 Type 2 diabetes mellitus with unspecified complications: Secondary | ICD-10-CM | POA: Diagnosis not present

## 2020-07-07 DIAGNOSIS — Z7901 Long term (current) use of anticoagulants: Secondary | ICD-10-CM | POA: Diagnosis not present

## 2020-07-07 DIAGNOSIS — Z87898 Personal history of other specified conditions: Secondary | ICD-10-CM

## 2020-07-07 DIAGNOSIS — Z880 Allergy status to penicillin: Secondary | ICD-10-CM

## 2020-07-07 DIAGNOSIS — I77810 Thoracic aortic ectasia: Secondary | ICD-10-CM | POA: Diagnosis present

## 2020-07-07 DIAGNOSIS — G8929 Other chronic pain: Secondary | ICD-10-CM | POA: Diagnosis present

## 2020-07-07 DIAGNOSIS — E039 Hypothyroidism, unspecified: Secondary | ICD-10-CM | POA: Diagnosis not present

## 2020-07-07 DIAGNOSIS — R262 Difficulty in walking, not elsewhere classified: Secondary | ICD-10-CM | POA: Diagnosis present

## 2020-07-07 DIAGNOSIS — I5032 Chronic diastolic (congestive) heart failure: Secondary | ICD-10-CM | POA: Diagnosis not present

## 2020-07-07 DIAGNOSIS — Z9181 History of falling: Secondary | ICD-10-CM

## 2020-07-07 DIAGNOSIS — I959 Hypotension, unspecified: Secondary | ICD-10-CM | POA: Diagnosis not present

## 2020-07-07 MED ORDER — HYDROCODONE-ACETAMINOPHEN 5-325 MG PO TABS: 1.0000 | ORAL_TABLET | Freq: Four times a day (QID) | ORAL | 0 refills | Status: DC | PRN

## 2020-07-07 NOTE — Telephone Encounter (Signed)
Kim daughter of patient called requesting something for pain. She states that her dad is extreme pain to the point he won't get out of bed to go to the bathroom. She would like something called into [facility] Dr. Linna Hoff. She states that he doesn't do well on tramadol. He is scheduled for an appt on 07/15/2020 for HFU from back pain.  CB# [phone]

## 2020-07-07 NOTE — Telephone Encounter (Signed)
Call placed to patient and patient daughter Maudie Mercury made aware.   Also states that they have caregivers in home.

## 2020-07-07 NOTE — Telephone Encounter (Signed)
Please advise 

## 2020-07-07 NOTE — Telephone Encounter (Signed)
I will send in norco but any opiate can cause confusion.  So they need to monitor him on the medication so that he doesn't fall and get hurt. He should probably be staying with someone until stronger.

## 2020-07-07 NOTE — ED Provider Notes (Signed)
[facility] EMERGENCY DEPARTMENT Provider Note   CSN: [id number] Arrival date & time: 07/07/20  2219     History Chief Complaint  Patient presents with  . Back Pain  . Loss of Consciousness    [name] is a 87 y.o. male with a hx of CAD, DM, hypertension, hyperlipidemia, hypothyroidism, prior stroke, CHF last EF 60-65%, paroxysmal afib & chronic anticoagulation on coumadin who presents to the ED with complaints of back pain that returned yesterday and syncope tonight. Patient reports pain is to the central lower back, radiates bilaterally, intermittent, worse with certain movements/positions, alleviated some with tylenol, hx of similar back pain when he was admitted. Tonight he had a syncope vs. Near syncope while eating dinner. He states he thinks he passed out, no prodrome, states "I think I just went to sleep". Family stated he was not responding to them. Family noted pulse low in the 50s, called EMS. EMS to triage RN- HR 08X, systolic BP <448, patient felt a bit lightheaded with position change, given 500 cc of fluid en route. Patient reports generally not feeling well since this event. Patient reports some frequency in urination since discharge, had 1 episode of dysuria. Denies fever, chills, N/V, chest pain, dyspnea, abdominal pain, acute weakness/numbness, urinary retention/incontinence, or recent fall/injury.   HPI     Past Medical History:  Diagnosis Date  . Atrial flutter ([place])   . BPH (benign prostatic hyperplasia)   . Coronary artery disease    a. s/p MI tx with Cypher DES to pRCA in 4/04;  b. Echocardiogram 7/09: Normal LV function.  c. Nuclear study 3/13 no ischemia;  d. ETT 2/14 neg;  e. admx with CP => LHC (01/30/2013):  pLAD 40-50%, oD1 70-80%, oOM1 40%, pRCA stent patent, mid RCA 30%, EF 60-65%. => med Rx.  . DDD (degenerative disc disease)   . Diabetes mellitus without complication ([place])   . Dyslipidemia   . Gait disorder 07/13/2018  .  Hemorrhoids   . Hx MRSA infection    left buttocks abscess  . Hx of echocardiogram    a. Echocardiogram (01/31/2013): Mild focal basal and mild concentric hypertrophy of the septum, EF 50-55%, normal wall motion, grade 1 diastolic dysfunction, trivial AI, MAC, mild LAE, PASP 35  . Hyperlipidemia   . Hypertension   . Hypothyroidism   . Meniere's disease    Status post shunt  . Myocardial infarction ([place]) 2008  . Occlusion and stenosis of carotid artery without mention of cerebral infarction    40-59% on carotid doppler 2014; [place] (01/2013): R 1-39%, L 60-79%  . Rotator cuff injury    chronic rotator cuff injury status post repair  . Seizure disorder ([place])   . Stroke [facility])    a. 01/2013=> post cardiac cath CVA to L post communicating artery system; R sided weakness  . Syncope     Patient Active Problem List   Diagnosis Date Noted  . Acute midline low back pain without sciatica   . Lumbar disc disease with radiculopathy 06/30/2020  . Weakness of both lower extremities 06/30/2020  . Chronic diastolic HF (heart failure) ([place]) 01/16/2020  . Acute exacerbation of CHF (congestive heart failure) ([place]) 08/19/2018  . Hypertensive urgency 08/19/2018  . Gait disorder 07/13/2018  . Recurrent syncope 07/13/2018  . E. coli UTI 06/19/2018  . Leucocytosis 06/19/2018  . Normal pressure hydrocephalus ([place]) 06/01/2018  . Altered mental status 05/31/2018  . AMS (altered mental status) 05/30/2018  . Seizure disorder (  [place]) 05/30/2018  . Fracture of proximal phalanx of finger fifth 06/17/2017 07/21/2017  . Upper airway cough syndrome 11/01/2016  . Atrial flutter ([place]) 08/05/2016  . Coronary artery disease involving native coronary artery of native heart without angina pectoris 08/05/2016  . Chronic anticoagulation 12/31/2015  . History of seizures 12/31/2015  . Acute diastolic CHF (congestive heart failure) ([place]) 12/24/2015  . Pulmonary edema   . Paroxysmal atrial fibrillation ([facility])   .  Hypothyroidism   . Controlled type 2 diabetes mellitus with complication, without long-term current use of insulin ([place])   . Hypertensive emergency 12/20/2015  . Special screening for malignant neoplasms, colon 12/07/2013  . Bowel habit changes 12/07/2013  . Diarrhea 03/09/2013  . History of cardioembolic cerebrovascular accident (CVA) 01/31/2013  . Chest pain 01/29/2013  . Bilateral carotid artery disease ([place])   . Loss of coordination 12/03/2012  . Morbid obesity due to excess calories ([place]) 08/20/2011  . CAD S/P percutaneous coronary angioplasty   . Dyslipidemia   . HTN (hypertension)     Past Surgical History:  Procedure Laterality Date  . COLONOSCOPY    . CORONARY ANGIOPLASTY WITH STENT PLACEMENT  07/14/2002   Stent to the right coronary artery  . ENDOLYMPHATIC SHUNT DECOMPRESSION  06/11/2009    Right endolymphatic sac decompression and shunt  placement  . HERNIA REPAIR  04/10/2008   scrotal hernia repair  . LEFT HEART CATHETERIZATION WITH CORONARY ANGIOGRAM N/A 01/30/2013   Procedure: LEFT HEART CATHETERIZATION WITH CORONARY ANGIOGRAM;  Surgeon: Larey Dresser, MD;  Location: [facility] CATH LAB;  Service: Cardiovascular;  Laterality: N/A;  . ROTATOR CUFF REPAIR     for chronic rotator cuff injury       Family History  Problem Relation Age of Onset  . Heart attack Father 15  . Aneurysm Mother        brain aneurysm  . Coronary artery disease Brother        with CABG  . Diabetes Brother   . Colon cancer Neg Hx   . Colon polyps Neg Hx     Social History   Tobacco Use  . Smoking status: Former Smoker    Years: 5.00    Types: Cigarettes    Quit date: 08/19/1956    Years since quitting: 63.9  . Smokeless tobacco: Former Systems developer    Types: Secondary school teacher  . Vaping Use: Never used  Substance Use Topics  . Alcohol use: No  . Drug use: No    Home Medications Prior to Admission medications   Medication Sig Start Date End Date Taking? Authorizing Provider   acetaminophen (TYLENOL) 325 MG tablet Take 1-2 tablets (325-650 mg total) by mouth every 4 (four) hours as needed for mild pain. 06/15/18   Love, Ivan Anchors, PA-C  albuterol (VENTOLIN HFA) 108 (90 Base) MCG/ACT inhaler Inhale 2 puffs into the lungs every 4 (four) hours as needed for wheezing or shortness of breath. 12/27/19   [name], MD  atorvastatin (LIPITOR) 80 MG tablet TAKE 1 TABLET BY MOUTH EVERY DAY 6PM 02/18/20   [name], MD  carbamazepine (CARBATROL) 300 MG 12 hr capsule TAKE 1 CAPSULE BY MOUTH TWICE A DAY 12/28/19   [name], MD  cloNIDine (CATAPRES) 0.2 MG tablet TAKE 1 TABLET(0.2 MG) BY MOUTH TWICE DAILY 06/17/20   [name], MD  escitalopram (LEXAPRO) 5 MG tablet TAKE 1 TABLET BY MOUTH EVERYDAY AT BEDTIME 12/27/19   [name], MD  furosemide (LASIX)  20 MG tablet Take 0.5 tablets (10 mg total) by mouth daily. 12/27/19   [name], MD  glucose blood test strip 1 each by Other route as needed. Use as instructed 08/25/18   Alycia Rossetti, MD  glucose monitoring kit (FREESTYLE) monitoring kit 1 each by Does not apply route as needed. 08/25/18   [place], Modena Nunnery, MD  HYDROcodone-acetaminophen (NORCO) 5-325 MG tablet Take 1 tablet by mouth every 6 (six) hours as needed for moderate pain. 07/07/20   [name], MD  isosorbide mononitrate (IMDUR) 60 MG 24 hr tablet Take 0.5 tablets (30 mg total) by mouth daily. 07/01/20   Lorella Nimrod, MD  Lancets (ACCU-CHEK SAFE-T PRO) lancets 1 each by Other route as needed. Use as instructed 08/25/18   Alycia Rossetti, MD  levETIRAcetam (KEPPRA) 250 MG tablet Take 1 tablet (250 mg total) by mouth 2 (two) times daily. 12/27/19   [name], MD  levothyroxine (SYNTHROID) 75 MCG tablet TAKE 1 TABLET(75 MCG) BY MOUTH DAILY 06/13/20   [name], MD  losartan (COZAAR) 100 MG tablet Take 0.5 tablets (50 mg total) by mouth daily. 07/01/20   Lorella Nimrod, MD  metoprolol tartrate (LOPRESSOR) 100 MG tablet  TAKE 1 TABLET(100 MG) BY MOUTH TWICE DAILY 04/15/20   [name], MD  Multiple Vitamins-Minerals (MULTIVITAMINS THER. W/MINERALS) TABS Take 1 tablet by mouth at bedtime.      [provider]  tamsulosin (FLOMAX) 0.4 MG CAPS capsule Take 1 capsule (0.4 mg total) by mouth daily after supper. 07/01/20   Lorella Nimrod, MD  warfarin (COUMADIN) 7.5 MG tablet TAKE 1&1/2 TABS TUESDAY, THURSDAY & SATURDAY*1 TAB ON SUNDAY, MONDAY, WEDNESDAY & FRIDAY WITH SUPPER 12/27/19   [name], MD    Allergies    Penicillins  Review of Systems   Review of Systems  Constitutional: Negative for chills and fever.  Respiratory: Negative for shortness of breath.   Cardiovascular: Negative for chest pain.  Gastrointestinal: Negative for abdominal pain, diarrhea, nausea and vomiting.  Genitourinary: Positive for dysuria and frequency.  Musculoskeletal: Positive for back pain.  Neurological: Positive for syncope. Negative for seizures, speech difficulty, weakness, numbness and headaches.       Negative for incontinence/saddle anesthesia.   All other systems reviewed and are negative.   Physical Exam Updated Vital Signs BP (!) 155/61 (BP Location: Left Arm)   Pulse 62   Temp 98.7 F (37.1 C) (Oral)   Resp 15   Ht _0  (1.727 m)   Wt 78.5 kg   SpO2 97%   BMI 26.30 kg/m   Physical Exam Vitals and nursing note reviewed.  Constitutional:      General: He is not in acute distress.    Appearance: He is well-developed. He is not toxic-appearing.  HENT:     Head: Normocephalic and atraumatic.  Eyes:     General:        Right eye: No discharge.        Left eye: No discharge.     Conjunctiva/sclera: Conjunctivae normal.  Cardiovascular:     Rate and Rhythm: Normal rate and regular rhythm.  Pulmonary:     Effort: Pulmonary effort is normal. No respiratory distress.     Breath sounds: Normal breath sounds. No wheezing, rhonchi or rales.  Abdominal:     General: There is no distension.      Palpations: Abdomen is soft.     Tenderness: There is no abdominal tenderness. There  is no guarding or rebound.  Musculoskeletal:     Cervical back: Neck supple.     Comments: Back: no rashes. Lower L spine tenderness midline and bilateral paraspinal muscles. No point/focal vertebral tenderness or palpable step off.  LEs: no pitting edema. Intact AROM throughout major joints. No focal bony tenderness. Compartments are soft.   Skin:    General: Skin is warm and dry.     Findings: No rash.  Neurological:     Mental Status: He is alert.     Comments: Clear speech. Sensation grossly intact to bilateral lower extremities. 5/5 strength with hip flexion/extension and ankle plantar/dorsiflexion bilaterally.   Psychiatric:        Behavior: Behavior normal.     ED Results / Procedures / Treatments   Labs (all labs ordered are listed, but only abnormal results are displayed) Labs Reviewed  CBC WITH DIFFERENTIAL/PLATELET - Abnormal; Notable for the following components:      Result Value   RBC 3.45 (*)    Hemoglobin 10.7 (*)    HCT 33.4 (*)    Abs Immature Granulocytes 0.09 (*)    All other components within normal limits  COMPREHENSIVE METABOLIC PANEL - Abnormal; Notable for the following components:   Glucose, Bld 124 (*)    Calcium 7.9 (*)    Total Protein 5.2 (*)    Albumin 3.0 (*)    Anion gap 3 (*)    All other components within normal limits  URINALYSIS, ROUTINE W REFLEX MICROSCOPIC  TROPONIN I (HIGH SENSITIVITY)    EKG EKG Interpretation  Date/Time:  Monday July 07 2020 22:44:41 EDT Ventricular Rate:  58 PR Interval:  209 QRS Duration: 140 QT Interval:  445 QTC Calculation: 438 R Axis:   -34 Text Interpretation: Sinus or ectopic atrial rhythm Left bundle branch block Confirmed by Nanda Quinton [phone]) on 07/08/2020 5:51:34 AM   Radiology CT Head Wo Contrast  Result Date: 07/08/2020 CLINICAL DATA:  Syncope EXAM: CT HEAD WITHOUT CONTRAST TECHNIQUE: Contiguous  axial images were obtained from the base of the skull through the vertex without intravenous contrast. COMPARISON:  06/29/2020 FINDINGS: Brain: Old left thalamic lacunar infarct. There is atrophy and chronic small vessel disease changes. No acute intracranial abnormality. Specifically, no hemorrhage, hydrocephalus, mass lesion, acute infarction, or significant intracranial injury. Vascular: No hyperdense vessel or unexpected calcification. Skull: No acute calvarial abnormality. Sinuses/Orbits: No acute findings Other: None IMPRESSION: Atrophy, chronic microvascular disease. No acute intracranial abnormality. Electronically Signed   By: Rolm Baptise M.D.   On: 07/08/2020 02:28   DG Chest Portable 1 View  Result Date: 07/08/2020 CLINICAL DATA:  Cough EXAM: PORTABLE CHEST 1 VIEW COMPARISON:  06/29/2020 radiograph, 07/02/2011 CT FINDINGS: Low volumes and streaky opacities favoring atelectatic change. No consolidation, features of edema, pneumothorax, or effusion. Pulmonary vascularity is normally distributed. The cardiomediastinal contours are stable from priors with cardiomegaly and aortic atherosclerosis. No acute osseous or soft tissue abnormality. Degenerative changes are present in the imaged spine and shoulders. Telemetry leads overlie the chest. IMPRESSION: 1. Low volumes and streaky opacities favoring atelectatic change. 2. Stable cardiomegaly. 3.  Aortic Atherosclerosis (ICD10-I70.0). Electronically Signed   By: Lovena Le M.D.   On: 07/08/2020 02:07    Procedures Procedures   Medications Ordered in ED Medications - No data to display  ED Course  I have reviewed the triage vital signs and the nursing notes.  Pertinent labs & imaging results that were available during my care of the patient were reviewed  by me and considered in my medical decision making (see chart for details).    MDM Rules/Calculators/A&P                          Patient presents to the ED with complaints of back pain &  syncope.  Nontoxic, BP somewhat elevated, mild intermittent bradycardia.  Back pain-diffuse lumbar tenderness, good strength with plantar/dorsiflexion, able to lift legs off of the stretcher, sensation grossly intact. No recent trauma/injury and afebrile, will hold off on repeat imaging of the back at this time. Will give tylenol & apply lidoderm patch. Labs, EKG, cardiac monitor, imaging.   Additional history obtained:  Additional history obtained from chart review & nursing note review.   Patient with recent hospital admission 06/29/2020 through 07/01/2020.  Patient presented with generalized weakness and low back pain.  He underwent lumbar spine MRI which revealed inflammatory signal at L1-2 disc and medial left psoas without abscess.  Also had multilevel spinal stenosis.  There was a concern of either discitis or exacerbation of his chronic degenerative disc disease.  His CRP and ESR were within normal limits.  Appeared somewhat dehydrated during admission, did have an episode of hypotension but remained asymptomatic.  There was some adjustment in his antihypertensive medications.  He did have some urinary frequency during hospitalization no started on Flomax. Discharged home.   I called and spoke with patient's daughter via telephone, she expressed significant concern about patient's episode this evening.  She states that he has been very weak and not himself today.  She states that he was sitting at the dinner table and all of a sudden became unresponsive, was not answering questions, eventually came back to.  She has never witnessed him have an episode like this before. He has generally not been doing well, she is concerned about his mobility.    EKG: LBBB, no significant change compared to prior.  Lab Tests:  I Ordered, reviewed, and interpreted labs, which included:  CBC: Mild anemia similar to prior ranges.  CMP: Mild hypocalcemia & hypalbuminemia.  Troponin: WNL UA: Hematuria  Imaging  Studies ordered:  I ordered imaging studies which included CXR & CT head wo contrast, I independently reviewed, formal radiology impression shows:  CT head: Atrophy, chronic microvascular disease. No acute intracranial abnormality. CXR: 1. Low volumes and streaky opacities favoring atelectatic change. 2. Stable cardiomegaly. 3.  Aortic Atherosclerosis  ED Course:  Patient & his daughter do not feel he is safe for discharge with his weakness & syncope episode tonight. Given syncope with CHF will discuss w/ hospitalist service for admission. Discussed w/ attending Dr. Laverta [place]- in agreement.   05:43: CONSULT: Discussed with hospitalist Dr. Hal Hope- accepts admission.   Portions of this note were generated with Lobbyist. Dictation errors may occur despite best attempts at proofreading.  Final Clinical Impression(s) / ED Diagnoses Final diagnoses:  Syncope, unspecified syncope type    Rx / DC Orders ED Discharge Orders    None       Amaryllis Dyke, PA-C 07/08/20 0554    Margette Fast, MD 07/08/20 [phone]

## 2020-07-07 NOTE — ED Triage Notes (Signed)
Pt brought to ED by [facility] EMS via stretcher with c/o "low heart rate" per family. EMS states family made initial call for pt having low pulse in 50s and "slower than normal responses". Per EMS, initial pulse was 50, with SBP<110, pt also reported dizziness while ambulating to EMS stretcher; 500 ml fluid resuscitation administered and pt demonstrated improvement in vitals. Per triage assessment by this RN, pt AOX4, reporting recurrent back pain and recent hx of UTI. Pt reports dysuria and recent hospitalization for similar symptoms.

## 2020-07-08 ENCOUNTER — Emergency Department (HOSPITAL_COMMUNITY): Payer: Medicare Other

## 2020-07-08 ENCOUNTER — Encounter (HOSPITAL_COMMUNITY): Payer: Self-pay | Admitting: Internal Medicine

## 2020-07-08 ENCOUNTER — Observation Stay (HOSPITAL_COMMUNITY): Payer: Medicare Other

## 2020-07-08 ENCOUNTER — Inpatient Hospital Stay (HOSPITAL_COMMUNITY): Payer: Medicare Other

## 2020-07-08 DIAGNOSIS — Z8614 Personal history of Methicillin resistant Staphylococcus aureus infection: Secondary | ICD-10-CM | POA: Diagnosis not present

## 2020-07-08 DIAGNOSIS — E785 Hyperlipidemia, unspecified: Secondary | ICD-10-CM

## 2020-07-08 DIAGNOSIS — R001 Bradycardia, unspecified: Secondary | ICD-10-CM | POA: Diagnosis present

## 2020-07-08 DIAGNOSIS — M545 Low back pain, unspecified: Secondary | ICD-10-CM

## 2020-07-08 DIAGNOSIS — I361 Nonrheumatic tricuspid (valve) insufficiency: Secondary | ICD-10-CM | POA: Diagnosis not present

## 2020-07-08 DIAGNOSIS — E039 Hypothyroidism, unspecified: Secondary | ICD-10-CM | POA: Diagnosis present

## 2020-07-08 DIAGNOSIS — R55 Syncope and collapse: Secondary | ICD-10-CM | POA: Diagnosis present

## 2020-07-08 DIAGNOSIS — I10 Essential (primary) hypertension: Secondary | ICD-10-CM | POA: Diagnosis not present

## 2020-07-08 DIAGNOSIS — Z20822 Contact with and (suspected) exposure to covid-19: Secondary | ICD-10-CM | POA: Diagnosis present

## 2020-07-08 DIAGNOSIS — E118 Type 2 diabetes mellitus with unspecified complications: Secondary | ICD-10-CM

## 2020-07-08 DIAGNOSIS — Z8673 Personal history of transient ischemic attack (TIA), and cerebral infarction without residual deficits: Secondary | ICD-10-CM | POA: Diagnosis not present

## 2020-07-08 DIAGNOSIS — G40909 Epilepsy, unspecified, not intractable, without status epilepticus: Secondary | ICD-10-CM | POA: Diagnosis present

## 2020-07-08 DIAGNOSIS — Z955 Presence of coronary angioplasty implant and graft: Secondary | ICD-10-CM | POA: Diagnosis not present

## 2020-07-08 DIAGNOSIS — I5032 Chronic diastolic (congestive) heart failure: Secondary | ICD-10-CM | POA: Diagnosis present

## 2020-07-08 DIAGNOSIS — Z87891 Personal history of nicotine dependence: Secondary | ICD-10-CM | POA: Diagnosis not present

## 2020-07-08 DIAGNOSIS — M48061 Spinal stenosis, lumbar region without neurogenic claudication: Secondary | ICD-10-CM | POA: Diagnosis present

## 2020-07-08 DIAGNOSIS — I4892 Unspecified atrial flutter: Secondary | ICD-10-CM | POA: Diagnosis present

## 2020-07-08 DIAGNOSIS — I480 Paroxysmal atrial fibrillation: Secondary | ICD-10-CM | POA: Diagnosis present

## 2020-07-08 DIAGNOSIS — Z833 Family history of diabetes mellitus: Secondary | ICD-10-CM | POA: Diagnosis not present

## 2020-07-08 DIAGNOSIS — N39490 Overflow incontinence: Secondary | ICD-10-CM | POA: Diagnosis present

## 2020-07-08 DIAGNOSIS — I441 Atrioventricular block, second degree: Secondary | ICD-10-CM | POA: Diagnosis present

## 2020-07-08 DIAGNOSIS — Z7901 Long term (current) use of anticoagulants: Secondary | ICD-10-CM | POA: Diagnosis not present

## 2020-07-08 DIAGNOSIS — I2510 Atherosclerotic heart disease of native coronary artery without angina pectoris: Secondary | ICD-10-CM | POA: Diagnosis present

## 2020-07-08 DIAGNOSIS — Z8249 Family history of ischemic heart disease and other diseases of the circulatory system: Secondary | ICD-10-CM | POA: Diagnosis not present

## 2020-07-08 DIAGNOSIS — Z87898 Personal history of other specified conditions: Secondary | ICD-10-CM | POA: Diagnosis not present

## 2020-07-08 DIAGNOSIS — R262 Difficulty in walking, not elsewhere classified: Secondary | ICD-10-CM | POA: Diagnosis present

## 2020-07-08 DIAGNOSIS — G910 Communicating hydrocephalus: Secondary | ICD-10-CM | POA: Diagnosis present

## 2020-07-08 DIAGNOSIS — E119 Type 2 diabetes mellitus without complications: Secondary | ICD-10-CM | POA: Diagnosis present

## 2020-07-08 DIAGNOSIS — I110 Hypertensive heart disease with heart failure: Secondary | ICD-10-CM | POA: Diagnosis present

## 2020-07-08 LAB — ECHOCARDIOGRAM COMPLETE
AR max vel: 1.68 cm2
AV Area VTI: 1.8 cm2
AV Area mean vel: 1.67 cm2
AV Mean grad: 7.2 mmHg
AV Peak grad: 12.5 mmHg
Ao pk vel: 1.77 m/s
Area-P 1/2: 2.45 cm2
Calc EF: 50.7 %
Height: 68 in
S' Lateral: 3.9 cm
Single Plane A2C EF: 47.9 %
Single Plane A4C EF: 49.1 %
Weight: 2768 oz

## 2020-07-08 LAB — COMPREHENSIVE METABOLIC PANEL
ALT: 15 U/L (ref 0–44)
AST: 16 U/L (ref 15–41)
Albumin: 3 g/dL — ABNORMAL LOW (ref 3.5–5.0)
Alkaline Phosphatase: 62 U/L (ref 38–126)
Anion gap: 3 — ABNORMAL LOW (ref 5–15)
BUN: 23 mg/dL (ref 8–23)
CO2: 29 mmol/L (ref 22–32)
Calcium: 7.9 mg/dL — ABNORMAL LOW (ref 8.9–10.3)
Chloride: 103 mmol/L (ref 98–111)
Creatinine, Ser: 1.08 mg/dL (ref 0.61–1.24)
GFR, Estimated: >60 mL/min (ref >60)
Glucose, Bld: 124 mg/dL — ABNORMAL HIGH (ref 70–99)
Potassium: 4 mmol/L (ref 3.5–5.1)
Sodium: 135 mmol/L (ref 135–145)
Total Bilirubin: 0.4 mg/dL (ref 0.3–1.2)
Total Protein: 5.2 g/dL — ABNORMAL LOW (ref 6.5–8.1)

## 2020-07-08 LAB — TROPONIN I (HIGH SENSITIVITY)
Troponin I (High Sensitivity): 14 ng/L (ref <18)
Troponin I (High Sensitivity): 15 ng/L (ref <18)

## 2020-07-08 LAB — URINALYSIS, ROUTINE W REFLEX MICROSCOPIC
Bacteria, UA: NONE SEEN
Bilirubin Urine: NEGATIVE
Glucose, UA: NEGATIVE mg/dL
Ketones, ur: NEGATIVE mg/dL
Leukocytes,Ua: NEGATIVE
Nitrite: NEGATIVE
Protein, ur: NEGATIVE mg/dL
RBC / HPF: >50 RBC/hpf — ABNORMAL HIGH (ref 0–5)
Specific Gravity, Urine: 1.013 (ref 1.005–1.030)
pH: 5 (ref 5.0–8.0)

## 2020-07-08 LAB — CBC WITH DIFFERENTIAL/PLATELET
Abs Immature Granulocytes: 0.09 10*3/uL — ABNORMAL HIGH (ref 0.00–0.07)
Basophils Absolute: 0.1 10*3/uL (ref 0.0–0.1)
Basophils Relative: 1 %
Eosinophils Absolute: 0.5 10*3/uL (ref 0.0–0.5)
Eosinophils Relative: 6 %
HCT: 33.4 % — ABNORMAL LOW (ref 39.0–52.0)
Hemoglobin: 10.7 g/dL — ABNORMAL LOW (ref 13.0–17.0)
Immature Granulocytes: 1 %
Lymphocytes Relative: 19 %
Lymphs Abs: 1.5 10*3/uL (ref 0.7–4.0)
MCH: 31 pg (ref 26.0–34.0)
MCHC: 32 g/dL (ref 30.0–36.0)
MCV: 96.8 fL (ref 80.0–100.0)
Monocytes Absolute: 0.9 10*3/uL (ref 0.1–1.0)
Monocytes Relative: 12 %
Neutro Abs: 4.7 10*3/uL (ref 1.7–7.7)
Neutrophils Relative %: 61 %
Platelets: 300 10*3/uL (ref 150–400)
RBC: 3.45 MIL/uL — ABNORMAL LOW (ref 4.22–5.81)
RDW: 15.1 % (ref 11.5–15.5)
WBC: 7.8 10*3/uL (ref 4.0–10.5)
nRBC: 0 % (ref 0.0–0.2)

## 2020-07-08 LAB — PROTIME-INR
INR: 2.1 — ABNORMAL HIGH (ref 0.8–1.2)
Prothrombin Time: 23.9 seconds — ABNORMAL HIGH (ref 11.4–15.2)

## 2020-07-08 LAB — RESP PANEL BY RT-PCR (FLU A&B, COVID) ARPGX2
Influenza A by PCR: NEGATIVE
Influenza B by PCR: NEGATIVE
SARS Coronavirus 2 by RT PCR: NEGATIVE

## 2020-07-08 LAB — TSH: TSH: 4.954 u[IU]/mL — ABNORMAL HIGH (ref 0.350–4.500)

## 2020-07-08 LAB — GLUCOSE, CAPILLARY
Glucose-Capillary: 110 mg/dL — ABNORMAL HIGH (ref 70–99)
Glucose-Capillary: 155 mg/dL — ABNORMAL HIGH (ref 70–99)
Glucose-Capillary: 171 mg/dL — ABNORMAL HIGH (ref 70–99)

## 2020-07-08 MED ORDER — INSULIN ASPART 100 UNIT/ML ~~LOC~~ SOLN
0.0000 [IU] | Freq: Three times a day (TID) | SUBCUTANEOUS | Status: DC
  Administered 2020-07-08: 3 [IU] via SUBCUTANEOUS
  Administered 2020-07-09 (×2): 2 [IU] via SUBCUTANEOUS
  Administered 2020-07-10: 3 [IU] via SUBCUTANEOUS
  Administered 2020-07-10 – 2020-07-11 (×2): 2 [IU] via SUBCUTANEOUS

## 2020-07-08 MED ORDER — CARBAMAZEPINE ER 200 MG PO TB12
300.0000 mg | ORAL_TABLET | Freq: Two times a day (BID) | ORAL | Status: DC
  Administered 2020-07-08 – 2020-07-11 (×7): 300 mg via ORAL
  Filled 2020-07-08 (×10): qty 1

## 2020-07-08 MED ORDER — TAMSULOSIN HCL 0.4 MG PO CAPS
0.4000 mg | ORAL_CAPSULE | Freq: Every day | ORAL | Status: DC
  Administered 2020-07-08 – 2020-07-11 (×4): 0.4 mg via ORAL
  Filled 2020-07-08 (×4): qty 1

## 2020-07-08 MED ORDER — ISOSORBIDE MONONITRATE ER 60 MG PO TB24
60.0000 mg | ORAL_TABLET | Freq: Every day | ORAL | Status: DC
  Administered 2020-07-08 – 2020-07-11 (×4): 60 mg via ORAL
  Filled 2020-07-08 (×5): qty 1

## 2020-07-08 MED ORDER — HYDROCODONE-ACETAMINOPHEN 5-325 MG PO TABS: 1.0000 | ORAL_TABLET | Freq: Four times a day (QID) | ORAL | Status: DC | PRN

## 2020-07-08 MED ORDER — CLONIDINE HCL 0.2 MG PO TABS
0.2000 mg | ORAL_TABLET | Freq: Two times a day (BID) | ORAL | Status: DC
  Administered 2020-07-08 – 2020-07-11 (×7): 0.2 mg via ORAL
  Filled 2020-07-08 (×7): qty 1

## 2020-07-08 MED ORDER — SODIUM CHLORIDE 0.9% FLUSH
3.0000 mL | Freq: Two times a day (BID) | INTRAVENOUS | Status: DC
  Administered 2020-07-08 – 2020-07-10 (×4): 3 mL via INTRAVENOUS

## 2020-07-08 MED ORDER — ACETAMINOPHEN 325 MG PO TABS
650.0000 mg | ORAL_TABLET | Freq: Once | ORAL | Status: AC
  Administered 2020-07-08: 650 mg via ORAL
  Filled 2020-07-08: qty 2

## 2020-07-08 MED ORDER — PERFLUTREN LIPID MICROSPHERE
1.0000 mL | INTRAVENOUS | Status: AC | PRN
Start: 2020-07-08 — End: 2020-07-08
  Administered 2020-07-08: 2 mL via INTRAVENOUS
  Filled 2020-07-08: qty 10

## 2020-07-08 MED ORDER — INSULIN ASPART 100 UNIT/ML ~~LOC~~ SOLN
0.0000 [IU] | Freq: Every day | SUBCUTANEOUS | Status: DC
Start: 2020-07-08 — End: 2020-07-12

## 2020-07-08 MED ORDER — LEVETIRACETAM 250 MG PO TABS
250.0000 mg | ORAL_TABLET | Freq: Two times a day (BID) | ORAL | Status: DC
  Administered 2020-07-08 – 2020-07-11 (×7): 250 mg via ORAL
  Filled 2020-07-08 (×8): qty 1

## 2020-07-08 MED ORDER — GADOBUTROL 1 MMOL/ML IV SOLN
7.5000 mL | Freq: Once | INTRAVENOUS | Status: AC | PRN
  Administered 2020-07-08: 7.5 mL via INTRAVENOUS

## 2020-07-08 MED ORDER — WARFARIN - PHARMACIST DOSING INPATIENT: Freq: Every day | Status: DC

## 2020-07-08 MED ORDER — ACETAMINOPHEN 650 MG RE SUPP: 650.0000 mg | Freq: Four times a day (QID) | RECTAL | Status: DC | PRN

## 2020-07-08 MED ORDER — LEVOTHYROXINE SODIUM 75 MCG PO TABS
75.0000 ug | ORAL_TABLET | Freq: Every day | ORAL | Status: DC
  Administered 2020-07-08 – 2020-07-11 (×4): 75 ug via ORAL
  Filled 2020-07-08 (×4): qty 1

## 2020-07-08 MED ORDER — ATORVASTATIN CALCIUM 80 MG PO TABS
80.0000 mg | ORAL_TABLET | Freq: Every evening | ORAL | Status: DC
  Administered 2020-07-08 – 2020-07-11 (×4): 80 mg via ORAL
  Filled 2020-07-08 (×4): qty 1

## 2020-07-08 MED ORDER — WARFARIN SODIUM 10 MG PO TABS
11.2500 mg | ORAL_TABLET | Freq: Once | ORAL | Status: AC
  Administered 2020-07-08: 11.25 mg via ORAL
  Filled 2020-07-08: qty 1

## 2020-07-08 MED ORDER — ONDANSETRON HCL 4 MG PO TABS: 4.0000 mg | ORAL_TABLET | Freq: Four times a day (QID) | ORAL | Status: DC | PRN

## 2020-07-08 MED ORDER — ACETAMINOPHEN 325 MG PO TABS: 650.0000 mg | ORAL_TABLET | Freq: Four times a day (QID) | ORAL | Status: DC | PRN

## 2020-07-08 MED ORDER — METOPROLOL TARTRATE 100 MG PO TABS
100.0000 mg | ORAL_TABLET | Freq: Two times a day (BID) | ORAL | Status: DC
  Administered 2020-07-08 – 2020-07-09 (×2): 100 mg via ORAL
  Filled 2020-07-08 (×4): qty 1

## 2020-07-08 MED ORDER — ALBUTEROL SULFATE (2.5 MG/3ML) 0.083% IN NEBU: 2.5000 mg | INHALATION_SOLUTION | RESPIRATORY_TRACT | Status: DC | PRN

## 2020-07-08 MED ORDER — LACTATED RINGERS IV SOLN: INTRAVENOUS | Status: DC

## 2020-07-08 MED ORDER — ESCITALOPRAM OXALATE 10 MG PO TABS
5.0000 mg | ORAL_TABLET | Freq: Every day | ORAL | Status: DC
  Administered 2020-07-08 – 2020-07-10 (×3): 5 mg via ORAL
  Filled 2020-07-08 (×3): qty 1

## 2020-07-08 MED ORDER — ONDANSETRON HCL 4 MG/2ML IJ SOLN: 4.0000 mg | Freq: Four times a day (QID) | INTRAMUSCULAR | Status: DC | PRN

## 2020-07-08 MED ORDER — CARBAMAZEPINE ER 300 MG PO CP12: 300.0000 mg | ORAL_CAPSULE | Freq: Two times a day (BID) | ORAL | Status: DC

## 2020-07-08 MED ORDER — LOSARTAN POTASSIUM 50 MG PO TABS
50.0000 mg | ORAL_TABLET | Freq: Every day | ORAL | Status: DC
  Administered 2020-07-08 – 2020-07-11 (×4): 50 mg via ORAL
  Filled 2020-07-08 (×5): qty 1

## 2020-07-08 MED ORDER — LIDOCAINE 5 % EX PTCH
1.0000 | MEDICATED_PATCH | CUTANEOUS | Status: DC
  Administered 2020-07-08: 1 via TRANSDERMAL
  Filled 2020-07-08: qty 1

## 2020-07-08 NOTE — Progress Notes (Signed)
  Echocardiogram 2D Echocardiogram with deifntiy has been performed.  Darlina Sicilian M 07/08/2020, 1:09 PM

## 2020-07-08 NOTE — Progress Notes (Signed)
[place] for warfarin Indication: atrial fibrillation   Assessment: 87 yom on warfarin PTA for afib presenting with back, LOC. Pharmacy consulted to dose warfarin inpatient. INR pending on admission. Hg 10.7, plt wnl. No active bleed issues reported. Noted, patient on carbamazepine PTA (continued inpatient).  PTA warfarin dose: 7.5mg  daily except 11.25mg  on TTS (last dose 4/18 PTA)  Goal of Therapy:  INR 2-3 Monitor platelets by anticoagulation protocol: Yes   Plan:  F/u INR on admission to enter warfarin orders Monitor daily INR, CBC, s/sx bleeding  Elicia Lamp, PharmD, BCPS Clinical Pharmacist 07/08/2020 10:03 AM

## 2020-07-08 NOTE — Consult Note (Signed)
Reason for Consult: Back pain Referring Physician: Dr. Karren Cobble [name] is an 87 y.o. male.  HPI: 87 year old male with persistent severe lumbar pain aggravated by standing or walking.  The symptoms been ongoing for the past few weeks.  Patient denies history of accident or injury.  Patient denies radiating pain into his lower extremities.  He denies numbness paresthesias or weakness although patient has had falls and is having difficulty with ambulation.  Patient with multiple coexistent medical problems including significant coronary artery disease, diabetes mellitus, hypertension, hyperlipidemia, hypothyroidism, congestive heart failure, chronic A. fib on anticoagulation, and history of prior stroke.  Patient also with significant urinary incontinence.  He denies any numbness paresthesias involving his lower extremities or perineal region.  He has no history of fevers or chills.  He has had prior work-up including an MRI scan done little over 1 week ago which demonstrated evidence of significant multilevel spondylitic disease with significant stenosis particularly at L2-3 but also at L1-2, L3-4 and L4-5.  There is no evidence of acute change.  There is some disc space and endplate changes worrisome for early osteomyelitis discitis at L1-2.  Patient had negative sed rate and C-reactive proteins done previously.  Past Medical History:  Diagnosis Date  . Atrial flutter ([place])   . BPH (benign prostatic hyperplasia)   . Coronary artery disease    a. s/p MI tx with Cypher DES to pRCA in 4/04;  b. Echocardiogram 7/09: Normal LV function.  c. Nuclear study 3/13 no ischemia;  d. ETT 2/14 neg;  e. admx with CP => [facility] (01/30/2013):  pLAD 40-50%, oD1 70-80%, oOM1 40%, pRCA stent patent, mid RCA 30%, EF 60-65%. => med Rx.  . DDD (degenerative disc disease)   . Diabetes mellitus without complication ([place])   . Dyslipidemia   . Gait disorder 07/13/2018  . Hemorrhoids   . Hx MRSA infection    left buttocks  abscess  . Hx of echocardiogram    a. Echocardiogram (01/31/2013): Mild focal basal and mild concentric hypertrophy of the septum, EF 50-55%, normal wall motion, grade 1 diastolic dysfunction, trivial AI, MAC, mild LAE, PASP 35  . Hyperlipidemia   . Hypertension   . Hypothyroidism   . Meniere's disease    Status post shunt  . Myocardial infarction ([place]) 2008  . Occlusion and stenosis of carotid artery without mention of cerebral infarction    40-59% on carotid doppler 2014; [place] (01/2013): R 1-39%, L 60-79%  . Rotator cuff injury    chronic rotator cuff injury status post repair  . Seizure disorder ([place])   . Stroke [facility])    a. 01/2013=> post cardiac cath CVA to L post communicating artery system; R sided weakness  . Syncope     Past Surgical History:  Procedure Laterality Date  . COLONOSCOPY    . CORONARY ANGIOPLASTY WITH STENT PLACEMENT  07/14/2002   Stent to the right coronary artery  . ENDOLYMPHATIC SHUNT DECOMPRESSION  06/11/2009    Right endolymphatic sac decompression and shunt  placement  . HERNIA REPAIR  04/10/2008   scrotal hernia repair  . LEFT HEART CATHETERIZATION WITH CORONARY ANGIOGRAM N/A 01/30/2013   Procedure: LEFT HEART CATHETERIZATION WITH CORONARY ANGIOGRAM;  Surgeon: Larey Dresser, MD;  Location: [facility] CATH LAB;  Service: Cardiovascular;  Laterality: N/A;  . ROTATOR CUFF REPAIR     for chronic rotator cuff injury    Family History  Problem Relation Age of Onset  . Heart attack Father 53  .  Aneurysm Mother        brain aneurysm  . Coronary artery disease Brother        with CABG  . Diabetes Brother   . Colon cancer Neg Hx   . Colon polyps Neg Hx     Social History:  reports that he quit smoking about 63 years ago. His smoking use included cigarettes. He quit after 5.00 years of use. He has quit using smokeless tobacco.  His smokeless tobacco use included chew. He reports that he does not drink alcohol and does not use drugs.  Allergies:  Allergies   Allergen Reactions  . Penicillins     Unknown reaction    Medications: I have reviewed the patient's current medications.  Results for orders placed or performed during the hospital encounter of 07/07/20 (from the past 48 hour(s))  CBC with Differential     Status: Abnormal   Collection Time: 07/07/20 10:41 PM  Result Value Ref Range   WBC 7.8 4.0 - 10.5 K/uL   RBC 3.45 (L) 4.22 - 5.81 MIL/uL   Hemoglobin 10.7 (L) 13.0 - 17.0 g/dL   HCT 33.4 (L) 39.0 - 52.0 %   MCV 96.8 80.0 - 100.0 fL   MCH 31.0 26.0 - 34.0 pg   MCHC 32.0 30.0 - 36.0 g/dL   RDW 15.1 11.5 - 15.5 %   Platelets 300 150 - 400 K/uL   nRBC 0.0 0.0 - 0.2 %   Neutrophils Relative % 61 %   Neutro Abs 4.7 1.7 - 7.7 K/uL   Lymphocytes Relative 19 %   Lymphs Abs 1.5 0.7 - 4.0 K/uL   Monocytes Relative 12 %   Monocytes Absolute 0.9 0.1 - 1.0 K/uL   Eosinophils Relative 6 %   Eosinophils Absolute 0.5 0.0 - 0.5 K/uL   Basophils Relative 1 %   Basophils Absolute 0.1 0.0 - 0.1 K/uL   Immature Granulocytes 1 %   Abs Immature Granulocytes 0.09 (H) 0.00 - 0.07 K/uL    Comment: Performed at [facility], [address], [place], [place] [address]  Comprehensive metabolic panel     Status: Abnormal   Collection Time: 07/07/20 10:41 PM  Result Value Ref Range   Sodium 135 135 - 145 mmol/L   Potassium 4.0 3.5 - 5.1 mmol/L   Chloride 103 98 - 111 mmol/L   CO2 29 22 - 32 mmol/L   Glucose, Bld 124 (H) 70 - 99 mg/dL    Comment: Glucose reference range applies only to samples taken after fasting for at least 8 hours.   BUN 23 8 - 23 mg/dL   Creatinine, Ser 1.08 0.61 - 1.24 mg/dL   Calcium 7.9 (L) 8.9 - 10.3 mg/dL   Total Protein 5.2 (L) 6.5 - 8.1 g/dL   Albumin 3.0 (L) 3.5 - 5.0 g/dL   AST 16 15 - 41 U/L   ALT 15 0 - 44 U/L   Alkaline Phosphatase 62 38 - 126 U/L   Total Bilirubin 0.4 0.3 - 1.2 mg/dL   GFR, Estimated >60 >60 mL/min    Comment: (NOTE) Calculated using the CKD-EPI Creatinine Equation (2021)    Anion  gap 3 (L) 5 - 15    Comment: Performed at [facility], [place] [address] [place], [place] [address]  Troponin I (High Sensitivity)     Status: None   Collection Time: 07/08/20  1:46 AM  Result Value Ref Range   Troponin I (High Sensitivity) 14 <18  ng/L    Comment: (NOTE) Elevated high sensitivity troponin I (hsTnI) values and significant  changes across serial measurements may suggest ACS but many other  chronic and acute conditions are known to elevate hsTnI results.  Refer to the "Links" section for chest pain algorithms and additional  guidance. Performed at [facility], [place] [address] [place], [place] [address]   Troponin I (High Sensitivity)     Status: None   Collection Time: 07/08/20  3:46 AM  Result Value Ref Range   Troponin I (High Sensitivity) 15 <18 ng/L    Comment: (NOTE) Elevated high sensitivity troponin I (hsTnI) values and significant  changes across serial measurements may suggest ACS but many other  chronic and acute conditions are known to elevate hsTnI results.  Refer to the "Links" section for chest pain algorithms and additional  guidance. Performed at [facility], [place] [address], [place], [place] [address]   Urinalysis, Routine w reflex microscopic Urine, Catheterized     Status: Abnormal   Collection Time: 07/08/20  4:30 AM  Result Value Ref Range   Color, Urine YELLOW YELLOW   APPearance CLEAR CLEAR   Specific Gravity, Urine 1.013 1.005 - 1.030   pH 5.0 5.0 - 8.0   Glucose, UA NEGATIVE NEGATIVE mg/dL   Hgb urine dipstick LARGE (A) NEGATIVE   Bilirubin Urine NEGATIVE NEGATIVE   Ketones, ur NEGATIVE NEGATIVE mg/dL   Protein, ur NEGATIVE NEGATIVE mg/dL   Nitrite NEGATIVE NEGATIVE   Leukocytes,Ua NEGATIVE NEGATIVE   RBC / HPF >50 (H) 0 - 5 RBC/hpf   WBC, UA 0-5 0 - 5 WBC/hpf   Bacteria, UA NONE SEEN NONE SEEN   Mucus PRESENT     Comment: Performed at [place] [facility] [address] [place], [place] [address]  Resp Panel by  RT-PCR (Flu A&B, Covid) Urine, Catheterized     Status: None   Collection Time: 07/08/20  4:30 AM   Specimen: Urine, Catheterized; Nasopharyngeal(NP) swabs in vial transport medium  Result Value Ref Range   SARS Coronavirus 2 by RT PCR NEGATIVE NEGATIVE    Comment: (NOTE) SARS-CoV-2 target nucleic acids are NOT DETECTED.  The SARS-CoV-2 RNA is generally detectable in upper respiratory specimens during the acute phase of infection. The lowest concentration of SARS-CoV-2 viral copies this assay can detect is 138 copies/mL. A negative result does not preclude SARS-Cov-2 infection and should not be used as the sole basis for treatment or other patient management decisions. A negative result may occur with  improper specimen collection/handling, submission of specimen other than nasopharyngeal swab, presence of viral mutation(s) within the areas targeted by this assay, and inadequate number of viral copies(<138 copies/mL). A negative result must be combined with clinical observations, patient history, and epidemiological information. The expected result is Negative.  Fact Sheet for Patients:  EntrepreneurPulse.com.au  Fact Sheet for Healthcare Providers:  IncredibleEmployment.be  This test is no Daniel yet approved or cleared by the [place] FDA and  has been authorized for detection and/or diagnosis of SARS-CoV-2 by FDA under an Emergency Use Authorization (EUA). This EUA will remain  in effect (meaning this test can be used) for the duration of the COVID-19 declaration under Section 564(b)(1) of the Act, 21 U.S.C.section 360bbb-3(b)(1), unless the authorization is terminated  or revoked sooner.       Influenza A by PCR NEGATIVE NEGATIVE   Influenza B by PCR NEGATIVE NEGATIVE    Comment: (NOTE) The Xpert Xpress SARS-CoV-2/FLU/RSV plus  assay is intended as an aid in the diagnosis of influenza from Nasopharyngeal swab specimens and should not be  used as a sole basis for treatment. Nasal washings and aspirates are unacceptable for Xpert Xpress SARS-CoV-2/FLU/RSV testing.  Fact Sheet for Patients: EntrepreneurPulse.com.au  Fact Sheet for Healthcare Providers: IncredibleEmployment.be  This test is not yet approved or cleared by the [place] FDA and has been authorized for detection and/or diagnosis of SARS-CoV-2 by FDA under an Emergency Use Authorization (EUA). This EUA will remain in effect (meaning this test can be used) for the duration of the COVID-19 declaration under Section 564(b)(1) of the Act, 21 U.S.C. section 360bbb-3(b)(1), unless the authorization is terminated or revoked.  Performed at [facility], [place] [address], [place], [place] [address]   TSH     Status: Abnormal   Collection Time: 07/08/20 10:27 AM  Result Value Ref Range   TSH 4.954 (H) 0.350 - 4.500 uIU/mL    Comment: Performed by a 3rd Generation assay with a functional sensitivity of <=0.01 uIU/mL. Performed at [facility], [place] [address], [place], [place] [address]   Protime-INR     Status: Abnormal   Collection Time: 07/08/20 10:27 AM  Result Value Ref Range   Prothrombin Time 23.9 (H) 11.4 - 15.2 seconds   INR 2.1 (H) 0.8 - 1.2    Comment: (NOTE) INR goal varies based on device and disease states. Performed at [facility], [place] [address] [place], [place] [address]   Glucose, capillary     Status: Abnormal   Collection Time: 07/08/20 10:34 AM  Result Value Ref Range   Glucose-Capillary 110 (H) 70 - 99 mg/dL    Comment: Glucose reference range applies only to samples taken after fasting for at least 8 hours.    CT Head Wo Contrast  Result Date: 07/08/2020 CLINICAL DATA:  Syncope EXAM: CT HEAD WITHOUT CONTRAST TECHNIQUE: Contiguous axial images were obtained from the base of the skull through the vertex without intravenous contrast. COMPARISON:  06/29/2020 FINDINGS: Brain: Old  left thalamic lacunar infarct. There is atrophy and chronic small vessel disease changes. No acute intracranial abnormality. Specifically, no hemorrhage, hydrocephalus, mass lesion, acute infarction, or significant intracranial injury. Vascular: No hyperdense vessel or unexpected calcification. Skull: No acute calvarial abnormality. Sinuses/Orbits: No acute findings Other: None IMPRESSION: Atrophy, chronic microvascular disease. No acute intracranial abnormality. Electronically Signed   By: Rolm Baptise M.D.   On: 07/08/2020 02:28   DG Chest Portable 1 View  Result Date: 07/08/2020 CLINICAL DATA:  Cough EXAM: PORTABLE CHEST 1 VIEW COMPARISON:  06/29/2020 radiograph, 07/02/2011 CT FINDINGS: Low volumes and streaky opacities favoring atelectatic change. No consolidation, features of edema, pneumothorax, or effusion. Pulmonary vascularity is normally distributed. The cardiomediastinal contours are stable from priors with cardiomegaly and aortic atherosclerosis. No acute osseous or soft tissue abnormality. Degenerative changes are present in the imaged spine and shoulders. Telemetry leads overlie the chest. IMPRESSION: 1. Low volumes and streaky opacities favoring atelectatic change. 2. Stable cardiomegaly. 3.  Aortic Atherosclerosis (ICD10-I70.0). Electronically Signed   By: Lovena Le M.D.   On: 07/08/2020 02:07    Pertinent items noted in HPI and remainder of comprehensive ROS otherwise negative. Blood pressure (!) 170/98, pulse 64, temperature 97.8 F (36.6 C), temperature source Oral, resp. rate 20, height _0  (1.727 m), weight 78.5 kg, SpO2 100 %. Patient is awake and alert.  He is oriented and appears reasonably comfortable at present.  Examination of his  speech is fluent.  His cranial nerve function is intact bilaterally.  Motor examination is intact in both upper and lower extremities.  Sensory examination is normal.  Reflexes are hypoactive but symmetric.  No evidence of long track signs.   Patient has tenderness in his mid lumbar region.  No evidence of bony abnormality.  No pain with straight leg raising.  Ambulation not tested.  Examination head ears eyes nose throat is normal.  Chest and abdomen are benign.  Extremities are free from injury or deformity.  Assessment/Plan: The patient does have significant multilevel degeneration with associated spondylosis and significant lumbar stenosis at multiple levels most prominently at L2-3 and L45.  No evidence of acute disc herniation or recent structural change.  The patient's pain is predominantly within his back and is not radicular.  Although he does have some elements of neurogenic claudication probably I do not think that his stenosis is his primary pain driver.  Likewise although the patient has significant incontinence he has a markedly dilated and on his scan and I think this incontinence represents overflow incontinence from chronic outlet obstruction.  Given his good motor exam and lack of lower extremity numbness or perineal numbness I really do not think the patient has significant cauda equina dysfunction.  At this point he presents a difficult clinical decision.  I do not think that any type of simple decompressive surgery is likely to improve his situation.  As back pain is his primary problem I think we should rule out progression of what ever process was going on at the L1-2 level in case there is some degree of a low level osteomyelitis discitis present.  I would like him to get a follow-up MRI scan and I will order that.  I will plan on seeing him back after this is done  Charlie Pitter 07/08/2020, 2:03 PM

## 2020-07-08 NOTE — ED Notes (Signed)
Attempted report to inpatient unit x 1 

## 2020-07-08 NOTE — H&P (Addendum)
History and Physical    [name] LKT:[id number] DOB: 12/16/1933 DOA: 07/07/2020  PCP: [name], MD Consultants:  Percival Spanish - cardiology Patient coming from:  Home - lives with wife; NOK: Daughter, York Ram, [phone]   Chief Complaint: Syncope   HPI: [name] is a 87 y.o. male with medical history significant of afib on Coumadin; CVA; seizure d/o; CAD; hypothyroidism; HTN; HLD; and DM presenting with syncope.  They left a week or two ago for a trip to the [place] with family.  He was doing well when he left home but that day he noticed difficulty walking.  He was having back pain.  He thinks he was weak in both legs.  They stayed a few days and he just laid around and  it got worse.  They came home and he was so "demobilized."  He was admitted from 4/10-12 "and that's where I've been ever since... I think I was in the hospital and came back home and it didn't get no better so they brought me back in."  He didn't get any better at home.  His back was hurting so much and he couldn't do anything for himself.  He is not having back pain right now, but did have some last night.  He got to the point where if he moved he had terrible pain in his mid to low back.  +urinary incontinence, wears Depends especially at night, for "a good while."  No fecal incontinence.  No N/W/T of legs.  He doesn't know if he really passed out last night but he "did have a spell... like I went to sleep for 35-40 seconds."  He has had a similar episode before, was seated with both episodes, no fall.  Last night, he woke back up and his children were there.  He has had seizures years ago, thinks he takes medicine for this.  Last known seizure was over a year ago, but he thinks maybe last night's episode was a seizure.  In review of his last admission, he presented to [facility] on 4/11 for B LE weakness and back pain.  He was transferred to [facility] for MRI.  CRP and ESR were negative.  HTN medications were adjusted  (lowered) but to hypotension.  He was started on Flomax for his urinary symptoms.  MRI brain was without acute problems and there was mild cervical stenosis noted.  MRI of the L spine showed inflammatory signal in L1-2 thought to be acute on chronic disc/endplate degeneration with moderate to severe spinal stenosis.    I spoke with his daughter - they did not get any answers.  He was running fevers at home and there was no f/u.  He has been home and getting PT but he has continued to not feel very good.  Yesterday, he complained of severe low back pain.  He laid in the bed all day and finally was a little better last evening.  He didn't seem his normal self last night.  He got up and seemed to be bent over and pushing his walker.  He seemed maybe confused.  He ate dinner.  About 20 minutes later, they were talking to him.  He rubbed his hand across his head and then something happened - seizure? Stroke?Marland Kitchen  He seemed to "go away from [place]" without LOC.  It lasted 30-45 seconds "that he was completely oblivious".  About a year ago, he was hospitalized for similar episodes.  It seemed similar to  prior.  His BP seemed to be low (-/50) and HR was in 50s.  Prior to last hospitalization, he was able to walk to the [place] - after he got there within a couple of hours, he was wetting his bed, peeing on himself, unable to walk without assistance.  They have gotten a lift chair, PT, home assistance.      ED Course: [place], per Dr. Hal Hope:  87 year old male recently admitted for low back pain and was brought to the ER after patient had a syncopal episode while having dinner. EMS on arrival found patient to be hypotensive and mildly bradycardic. In the ER patient appears nonfocal. EKG shows sinus bradycardia heart rate around 58 beats minute.. Work-up so far negative. Admitted for observation. COVID test pending.  Review of Systems: As per HPI; otherwise review of systems reviewed and  negative.   Ambulatory Status:  Ambulates with a walker  COVID Vaccine Status:   Complete  Past Medical History:  Diagnosis Date  . Atrial flutter ([place])   . BPH (benign prostatic hyperplasia)   . Coronary artery disease    a. s/p MI tx with Cypher DES to pRCA in 4/04;  b. Echocardiogram 7/09: Normal LV function.  c. Nuclear study 3/13 no ischemia;  d. ETT 2/14 neg;  e. admx with CP => [facility] (01/30/2013):  pLAD 40-50%, oD1 70-80%, oOM1 40%, pRCA stent patent, mid RCA 30%, EF 60-65%. => med Rx.  . DDD (degenerative disc disease)   . Diabetes mellitus without complication ([place])   . Dyslipidemia   . Gait disorder 07/13/2018  . Hemorrhoids   . Hx MRSA infection    left buttocks abscess  . Hx of echocardiogram    a. Echocardiogram (01/31/2013): Mild focal basal and mild concentric hypertrophy of the septum, EF 50-55%, normal wall motion, grade 1 diastolic dysfunction, trivial AI, MAC, mild LAE, PASP 35  . Hyperlipidemia   . Hypertension   . Hypothyroidism   . Meniere's disease    Status post shunt  . Myocardial infarction ([place]) 2008  . Occlusion and stenosis of carotid artery without mention of cerebral infarction    40-59% on carotid doppler 2014; [place] (01/2013): R 1-39%, L 60-79%  . Rotator cuff injury    chronic rotator cuff injury status post repair  . Seizure disorder ([place])   . Stroke [facility])    a. 01/2013=> post cardiac cath CVA to L post communicating artery system; R sided weakness  . Syncope     Past Surgical History:  Procedure Laterality Date  . COLONOSCOPY    . CORONARY ANGIOPLASTY WITH STENT PLACEMENT  07/14/2002   Stent to the right coronary artery  . ENDOLYMPHATIC SHUNT DECOMPRESSION  06/11/2009    Right endolymphatic sac decompression and shunt  placement  . HERNIA REPAIR  04/10/2008   scrotal hernia repair  . LEFT HEART CATHETERIZATION WITH CORONARY ANGIOGRAM N/A 01/30/2013   Procedure: LEFT HEART CATHETERIZATION WITH CORONARY ANGIOGRAM;  Surgeon: Larey Dresser,  MD;  Location: [facility] CATH LAB;  Service: Cardiovascular;  Laterality: N/A;  . ROTATOR CUFF REPAIR     for chronic rotator cuff injury    Social History   Socioeconomic History  . Marital status: Married    Spouse name: Not on file  . Number of children: 4  . Years of education: 12th  . Highest education level: Not on file  Occupational History  . Occupation: Retired  Tobacco Use  . Smoking status: Former Smoker  Years: 5.00    Types: Cigarettes    Quit date: 08/19/1956    Years since quitting: 63.9  . Smokeless tobacco: Former Systems developer    Types: Secondary school teacher  . Vaping Use: Never used  Substance and Sexual Activity  . Alcohol use: No  . Drug use: No  . Sexual activity: Never  Other Topics Concern  . Not on file  Social History Narrative   Lives with wife.   Social Determinants of Health   Financial Resource Strain: Not on file  Food Insecurity: Not on file  Transportation Needs: Not on file  Physical Activity: Not on file  Stress: Not on file  Social Connections: Not on file  Intimate Partner Violence: Not on file    Allergies  Allergen Reactions  . Penicillins     Unknown reaction    Family History  Problem Relation Age of Onset  . Heart attack Father 58  . Aneurysm Mother        brain aneurysm  . Coronary artery disease Brother        with CABG  . Diabetes Brother   . Colon cancer Neg Hx   . Colon polyps Neg Hx     Prior to Admission medications   Medication Sig Start Date End Date Taking? Authorizing Provider  acetaminophen (TYLENOL) 325 MG tablet Take 1-2 tablets (325-650 mg total) by mouth every 4 (four) hours as needed for mild pain. 06/15/18  Yes Love, Ivan Anchors, PA-C  albuterol (VENTOLIN HFA) 108 (90 Base) MCG/ACT inhaler Inhale 2 puffs into the lungs every 4 (four) hours as needed for wheezing or shortness of breath. 12/27/19  Yes [name], MD  atorvastatin (LIPITOR) 80 MG tablet TAKE 1 TABLET BY MOUTH EVERY DAY 6PM Patient taking  differently: Take 80 mg by mouth every evening. 02/18/20  Yes [name], MD  carbamazepine (CARBATROL) 300 MG 12 hr capsule TAKE 1 CAPSULE BY MOUTH TWICE A DAY Patient taking differently: Take 300 mg by mouth 2 (two) times daily. 12/28/19  Yes [name], MD  cloNIDine (CATAPRES) 0.2 MG tablet TAKE 1 TABLET(0.2 MG) BY MOUTH TWICE DAILY Patient taking differently: Take 0.2 mg by mouth 2 (two) times daily. 06/17/20  Yes [name], MD  escitalopram (LEXAPRO) 5 MG tablet TAKE 1 TABLET BY MOUTH EVERYDAY AT BEDTIME Patient taking differently: Take 5 mg by mouth at bedtime. 12/27/19  Yes [name], MD  furosemide (LASIX) 20 MG tablet Take 0.5 tablets (10 mg total) by mouth daily. 12/27/19  Yes [name], MD  glucose blood test strip 1 each by Other route as needed. Use as instructed 08/25/18  Yes [place], Modena Nunnery, MD  glucose monitoring kit (FREESTYLE) monitoring kit 1 each by Does not apply route as needed. 08/25/18  Yes [place], Modena Nunnery, MD  HYDROcodone-acetaminophen (NORCO) 5-325 MG tablet Take 1 tablet by mouth every 6 (six) hours as needed for moderate pain. 07/07/20  Yes [name], MD  isosorbide mononitrate (IMDUR) 60 MG 24 hr tablet Take 0.5 tablets (30 mg total) by mouth daily. Patient taking differently: Take 60 mg by mouth daily. 07/01/20  Yes Lorella Nimrod, MD  Lancets (ACCU-CHEK SAFE-T PRO) lancets 1 each by Other route as needed. Use as instructed 08/25/18  Yes [place], Modena Nunnery, MD  levETIRAcetam (KEPPRA) 250 MG tablet Take 1 tablet (250 mg total) by mouth 2 (two) times daily. 12/27/19  Yes [name], MD  levothyroxine (SYNTHROID) 75  MCG tablet TAKE 1 TABLET(75 MCG) BY MOUTH DAILY Patient taking differently: Take 75 mcg by mouth daily before breakfast. 06/13/20  Yes [name], MD  losartan (COZAAR) 100 MG tablet Take 0.5 tablets (50 mg total) by mouth daily. 07/01/20  Yes Lorella Nimrod, MD  metFORMIN (GLUCOPHAGE) 500 MG tablet Take 500 mg by  mouth 2 (two) times daily with a meal.   Yes [provider]  metoprolol tartrate (LOPRESSOR) 100 MG tablet TAKE 1 TABLET(100 MG) BY MOUTH TWICE DAILY Patient taking differently: Take 100 mg by mouth 2 (two) times daily. 04/15/20  Yes [name], MD  Multiple Vitamins-Minerals (MULTIVITAMINS THER. W/MINERALS) TABS Take 1 tablet by mouth at bedtime.     Yes [provider]  tamsulosin (FLOMAX) 0.4 MG CAPS capsule Take 1 capsule (0.4 mg total) by mouth daily after supper. 07/01/20  Yes Lorella Nimrod, MD  warfarin (COUMADIN) 7.5 MG tablet TAKE 1&1/2 TABS TUESDAY, THURSDAY & SATURDAY*1 TAB ON SUNDAY, MONDAY, [place] SUPPER Patient taking differently: Take 7.5-11.25 mg by mouth See admin instructions. 1&1/2 tab Tuesday,Thursday,Saturday. 1 tab Monday,Wednesday,Friday and sunday 12/27/19  Yes [name], MD  amoxicillin (AMOXIL) 500 MG capsule Take 500 mg by mouth See admin instructions. Qid x 7 days Patient not taking: Reported on 07/08/2020    [provider]    Physical Exam: Vitals:   07/08/20 0930 07/08/20 0945 07/08/20 1020 07/08/20 1320  BP:  (!) 158/75 (!) 187/86 (!) 170/98  Pulse: 60 (!) 59 64 64  Resp: 19  20   Temp:   97.8 F (36.6 C)   TempSrc:   Oral   SpO2: 96% 94% 100%   Weight:      Height:         . General:  Appears calm and comfortable and is in NAD, possibly mildly confused . Eyes:   EOMI, normal lids, iris . ENT:  grossly normal hearing, lips & tongue, mmm . Neck:  no LAD, masses or thyromegaly . Cardiovascular:  RR with mild bradycardia, no m/r/g. No LE edema.  Marland Kitchen Respiratory:   CTA bilaterally with no wheezes/rales/rhonchi.  Normal respiratory effort. . Abdomen:  soft, NT, ND . Back:   normal alignment, no CVAT, lumbar spine discomfort with lidoderm patch in place . Skin:  no rash or induration seen on limited exam . Musculoskeletal:  Mildly decreased tone BUE/BLE with possible increased weakness of RLE  (difficult to know if present and if new), good ROM, no bony abnormality . Psychiatric:  blunted mood and affect, speech fluent and appropriate, AOx3 . Neurologic:  CN 2-12 grossly intact, moves all extremities in coordinated fashion    Radiological Exams on Admission: Independently reviewed - see discussion in A/P where applicable  CT Head Wo Contrast  Result Date: 07/08/2020 CLINICAL DATA:  Syncope EXAM: CT HEAD WITHOUT CONTRAST TECHNIQUE: Contiguous axial images were obtained from the base of the skull through the vertex without intravenous contrast. COMPARISON:  06/29/2020 FINDINGS: Brain: Old left thalamic lacunar infarct. There is atrophy and chronic small vessel disease changes. No acute intracranial abnormality. Specifically, no hemorrhage, hydrocephalus, mass lesion, acute infarction, or significant intracranial injury. Vascular: No hyperdense vessel or unexpected calcification. Skull: No acute calvarial abnormality. Sinuses/Orbits: No acute findings Other: None IMPRESSION: Atrophy, chronic microvascular disease. No acute intracranial abnormality. Electronically Signed   By: Rolm Baptise M.D.   On: 07/08/2020 02:28   DG Chest Portable 1 View  Result Date: 07/08/2020 CLINICAL DATA:  Cough  EXAM: PORTABLE CHEST 1 VIEW COMPARISON:  06/29/2020 radiograph, 07/02/2011 CT FINDINGS: Low volumes and streaky opacities favoring atelectatic change. No consolidation, features of edema, pneumothorax, or effusion. Pulmonary vascularity is normally distributed. The cardiomediastinal contours are stable from priors with cardiomegaly and aortic atherosclerosis. No acute osseous or soft tissue abnormality. Degenerative changes are present in the imaged spine and shoulders. Telemetry leads overlie the chest. IMPRESSION: 1. Low volumes and streaky opacities favoring atelectatic change. 2. Stable cardiomegaly. 3.  Aortic Atherosclerosis (ICD10-I70.0). Electronically Signed   By: Lovena Le M.D.   On: 07/08/2020  02:07   ECHOCARDIOGRAM COMPLETE  Result Date: 07/08/2020    ECHOCARDIOGRAM REPORT   Patient Name:   DREUX MCGROARTY Date of Exam: 07/08/2020 Medical Rec #:  [id number]        Height:       68.0 in Accession #:    [id number]       Weight:       173.0 lb Date of Birth:  [birth year]         BSA:          1.922 m Patient Age:    81 years         BP:           184/86 mmHg Patient Gender: M                HR:           64 bpm. Exam Location:  Inpatient Procedure: 2D Echo, Cardiac Doppler, Color Doppler and Intracardiac            Opacification Agent Indications:    Syncope R55  History:        Patient has prior history of Echocardiogram examinations, most                 recent 05/30/2018. CAD, Stroke and Carotid Disease,                 Arrythmias:LBBB; Risk Factors:Hypertension, Diabetes and                 Dyslipidemia. Meniere's disease. History of Aflutter.  Sonographer:    Darlina Sicilian RDCS Referring Phys: [place]  1. Left ventricular ejection fraction, by estimation, is 50 to 55%. The left ventricle has low normal function. The left ventricle has no regional wall motion abnormalities. There is mild left ventricular hypertrophy. Left ventricular diastolic parameters are consistent with Grade I diastolic dysfunction (impaired relaxation). Elevated left atrial pressure.  2. Right ventricular systolic function is normal. The right ventricular size is normal. There is mildly elevated pulmonary artery systolic pressure.  3. Left atrial size was severely dilated.  4. The mitral valve is normal in structure. Trivial mitral valve regurgitation. No evidence of mitral stenosis. Moderate mitral annular calcification.  5. The aortic valve is tricuspid. There is mild calcification of the aortic valve. There is moderate thickening of the aortic valve. Aortic valve regurgitation is not visualized. Mild to moderate aortic valve sclerosis/calcification is present, without any evidence of aortic stenosis.   6. There is mild dilatation of the ascending aorta, measuring 39 mm. FINDINGS  Left Ventricle: Left ventricular ejection fraction, by estimation, is 50 to 55%. The left ventricle has low normal function. The left ventricle has no regional wall motion abnormalities. Definity contrast agent was given IV to delineate the left ventricular endocardial borders. The left ventricular internal cavity size was normal in size. There is mild left ventricular hypertrophy.  Left ventricular diastolic parameters are consistent with Grade I diastolic dysfunction (impaired relaxation). Elevated left atrial pressure. Right Ventricle: The right ventricular size is normal. No increase in right ventricular wall thickness. Right ventricular systolic function is normal. There is mildly elevated pulmonary artery systolic pressure. The tricuspid regurgitant velocity is 2.51  m/s, and with an assumed right atrial pressure of 8 mmHg, the estimated right ventricular systolic pressure is 61.1 mmHg. Left Atrium: Left atrial size was severely dilated. Right Atrium: Right atrial size was normal in size. Pericardium: There is no evidence of pericardial effusion. Mitral Valve: The mitral valve is normal in structure. Moderate mitral annular calcification. Trivial mitral valve regurgitation. No evidence of mitral valve stenosis. Tricuspid Valve: The tricuspid valve is normal in structure. Tricuspid valve regurgitation is mild. Aortic Valve: The aortic valve is tricuspid. There is mild calcification of the aortic valve. There is moderate thickening of the aortic valve. Aortic valve regurgitation is not visualized. Mild to moderate aortic valve sclerosis/calcification is present, without any evidence of aortic stenosis. Aortic valve mean gradient measures 7.2 mmHg. Aortic valve peak gradient measures 12.5 mmHg. Aortic valve area, by VTI measures 1.80 cm. Pulmonic Valve: The pulmonic valve was not well visualized. Pulmonic valve regurgitation is not  visualized. Aorta: The aortic root is normal in size and structure. There is mild dilatation of the ascending aorta, measuring 39 mm. IAS/Shunts: No atrial level shunt detected by color flow Doppler.  LEFT VENTRICLE PLAX 2D LVIDd:         4.90 cm      Diastology LVIDs:         3.90 cm      LV e' medial:    3.92 cm/s LV PW:         1.20 cm      LV E/e' medial:  18.6 LV IVS:        1.30 cm      LV e' lateral:   5.33 cm/s LVOT diam:     2.20 cm      LV E/e' lateral: 13.7 LV SV:         76 LV SV Index:   39 LVOT Area:     3.80 cm  LV Volumes (MOD) LV vol d, MOD A2C: 125.0 ml LV vol d, MOD A4C: 129.0 ml LV vol s, MOD A2C: 65.1 ml LV vol s, MOD A4C: 65.6 ml LV SV MOD A2C:     59.9 ml LV SV MOD A4C:     129.0 ml LV SV MOD BP:      67.5 ml RIGHT VENTRICLE RV S prime:     12.30 cm/s TAPSE (M-mode): 2.4 cm LEFT ATRIUM             Index       RIGHT ATRIUM           Index LA diam:        4.00 cm 2.08 cm/m  RA Area:     16.20 cm LA Vol (A2C):   86.3 ml 44.91 ml/m RA Volume:   38.50 ml  20.04 ml/m LA Vol (A4C):   97.8 ml 50.90 ml/m LA Biplane Vol: 92.8 ml 48.29 ml/m  AORTIC VALVE AV Area (Vmax):    1.68 cm AV Area (Vmean):   1.67 cm AV Area (VTI):     1.80 cm AV Vmax:           176.80 cm/s AV Vmean:  125.200 cm/s AV VTI:            0.421 m AV Peak Grad:      12.5 mmHg AV Mean Grad:      7.2 mmHg LVOT Vmax:         78.30 cm/s LVOT Vmean:        54.900 cm/s LVOT VTI:          0.199 m LVOT/AV VTI ratio: 0.47  AORTA Ao Root diam: 3.60 cm Ao Asc diam:  3.80 cm MITRAL VALVE               TRICUSPID VALVE MV Area (PHT): 2.45 cm    TR Peak grad:   25.2 mmHg MV Decel Time: 310 msec    TR Vmax:        251.00 cm/s MV E velocity: 72.80 cm/s MV A velocity: 87.00 cm/s  SHUNTS MV E/A ratio:  0.84        Systemic VTI:  0.20 m                            Systemic Diam: 2.20 cm [name] Croitoru MD Electronically signed by Sanda Klein MD Signature Date/Time: 07/08/2020/2:10:02 PM    Final     EKG: Independently reviewed.  Afib  with rate 58; LBBB with no evidence of acute ischemia   Labs on Admission: I have personally reviewed the available labs and imaging studies at the time of the admission.  Pertinent labs:   Glucose 124 HS troponin 14, 15 WBC 7.8 Hgb 10.7 UA: large Hgb COVID/flu negative INR 2.1 TSH 4.954 A1c 6.3 on 4/11   Assessment/Plan Principal Problem:   Syncope Active Problems:   Dyslipidemia   HTN (hypertension)   Paroxysmal atrial fibrillation (HCC)   Hypothyroidism   Controlled type 2 diabetes mellitus with complication, without long-term current use of insulin (HCC)   History of seizures   Acute midline low back pain without sciatica   Syncope -Patient with prior h/o stroke as well as known seizure d/o who presented with transient (<1 minute) episode of altered sensorium/unresponsiveness this AM -He continues to have back pain, weakness, and urinary incontinence since his last hospitalization -Vasovagal reaction is most likely diagnosis, perhaps in reaction to back pain -Seizure is also a consideration, will check EEG -Will admit on telemetry for further evaluation -Orthostatic vital signs now and in AM -Troponins negative x 2 -Head CT negative -2d echo ordered -Neuro checks  -PT/OT eval and treat  Back pain -Recent MRI with apparent severe lumbar spinal stenosis -Persistent weakness, difficulty ambulating, urinary incontinence -Neurosurgery consult requested, Dr. Annette Stable to see -Continue Lidoderm, Norco for pain  Urinary incontinence -Daughter reports significant issue with incontinence which is not baseline -Recently started on Flomax without improvement -He has reported h/o NPH but there was no mention of this on CT today  Afib -Rate controlled -Continue Coumadin, pharmacy to dose  Seizure d/o -EEG, as above -Continue Carbamazepine and Keppra  Hypothyroidism -Elevated TSH -Continue Synthroid at current dose for now but needs outpatient PCP f/u  HTN -Continue  Clonidine, Imdur, Cozaar, Lopressor  HLD -Continue Lipitor  DM -Recent A1c indicates good control -hold Glucophage -Cover with moderate-scale SSI    Note: This patient has been tested and is negative for the novel coronavirus COVID-19. The patient has been fully vaccinated against COVID-19.   DVT prophylaxis: Coumadin Code Status:  Full - confirmed with patient Family Communication: None present;  I spoke with his daughter by telephone at the time of admission Disposition Plan:  The patient is from: home  Anticipated d/c is to: home, possibly with [facility] services  Anticipated d/c date will depend on clinical response to treatment, likely 2-3 days  Patient is currently: acutely ill Consults called: Nuerosurgery; PT/OT; Nutrition Admission status: Admit - It is my clinical opinion that admission to [place] is reasonable and necessary because of the expectation that this patient will require hospital care that crosses at least 2 midnights to treat this condition based on the medical complexity of the problems presented.  Given the aforementioned information, the predictability of an adverse outcome is felt to be significant.    Karmen Bongo MD [facility]   How to contact the [facility] Attending or Consulting provider [place] or covering provider during after hours [place], for this patient?  1. Check the care team in [facility] and look for a) attending/consulting [facility] provider listed and b) the [facility] team listed 2. Log into [email or web address] and use [place]'s universal password to access. If you do not have the password, please contact the hospital operator. 3. Locate the [facility] provider you are looking for under [facility] and page to a number that you can be directly reached. 4. If you still have difficulty reaching the provider, please page the [facility] (Director on Call) for the Hospitalists listed on amion for assistance.   07/08/2020, 2:26 PM

## 2020-07-08 NOTE — Progress Notes (Signed)
EEG complete - results pending 

## 2020-07-08 NOTE — Progress Notes (Signed)
Pt arrived from ER with x2 RN; A/Ox4; Elevated BP other VSS; No complaints of pain or discomfort; Dual skin assessment completed; Will continue to monitor.

## 2020-07-08 NOTE — Progress Notes (Signed)
Pharmacy addendum: Re: Warfarin  INR 2.1, low therapeutic.  See prior Rx note.  Plan:  Warfarin 11.25 mg x 1 today, usual Tuesday dose.  Daily PT/INR.  Arty Baumgartner, [place] 07/08/2020 1:16 PM

## 2020-07-08 NOTE — ED Notes (Signed)
Patient is resting comfortably. 

## 2020-07-09 DIAGNOSIS — I480 Paroxysmal atrial fibrillation: Secondary | ICD-10-CM

## 2020-07-09 DIAGNOSIS — Z87898 Personal history of other specified conditions: Secondary | ICD-10-CM

## 2020-07-09 DIAGNOSIS — I10 Essential (primary) hypertension: Secondary | ICD-10-CM

## 2020-07-09 DIAGNOSIS — R55 Syncope and collapse: Principal | ICD-10-CM

## 2020-07-09 DIAGNOSIS — I441 Atrioventricular block, second degree: Secondary | ICD-10-CM

## 2020-07-09 LAB — CBC
HCT: 33.3 % — ABNORMAL LOW (ref 39.0–52.0)
Hemoglobin: 11 g/dL — ABNORMAL LOW (ref 13.0–17.0)
MCH: 31.6 pg (ref 26.0–34.0)
MCHC: 33 g/dL (ref 30.0–36.0)
MCV: 95.7 fL (ref 80.0–100.0)
Platelets: 300 10*3/uL (ref 150–400)
RBC: 3.48 MIL/uL — ABNORMAL LOW (ref 4.22–5.81)
RDW: 15.2 % (ref 11.5–15.5)
WBC: 8.2 10*3/uL (ref 4.0–10.5)
nRBC: 0 % (ref 0.0–0.2)

## 2020-07-09 LAB — BASIC METABOLIC PANEL
Anion gap: 5 (ref 5–15)
BUN: 18 mg/dL (ref 8–23)
CO2: 29 mmol/L (ref 22–32)
Calcium: 8.5 mg/dL — ABNORMAL LOW (ref 8.9–10.3)
Chloride: 101 mmol/L (ref 98–111)
Creatinine, Ser: 1.06 mg/dL (ref 0.61–1.24)
GFR, Estimated: >60 mL/min (ref >60)
Glucose, Bld: 109 mg/dL — ABNORMAL HIGH (ref 70–99)
Potassium: 4.4 mmol/L (ref 3.5–5.1)
Sodium: 135 mmol/L (ref 135–145)

## 2020-07-09 LAB — GLUCOSE, CAPILLARY
Glucose-Capillary: 117 mg/dL — ABNORMAL HIGH (ref 70–99)
Glucose-Capillary: 126 mg/dL — ABNORMAL HIGH (ref 70–99)
Glucose-Capillary: 122 mg/dL — ABNORMAL HIGH (ref 70–99)
Glucose-Capillary: 121 mg/dL — ABNORMAL HIGH (ref 70–99)

## 2020-07-09 LAB — PROTIME-INR
INR: 2.3 — ABNORMAL HIGH (ref 0.8–1.2)
Prothrombin Time: 25 seconds — ABNORMAL HIGH (ref 11.4–15.2)

## 2020-07-09 MED ORDER — WARFARIN SODIUM 10 MG PO TABS
11.2500 mg | ORAL_TABLET | ORAL | Status: DC
  Administered 2020-07-10: 11.25 mg via ORAL
  Filled 2020-07-09: qty 1

## 2020-07-09 MED ORDER — WARFARIN SODIUM 7.5 MG PO TABS
7.5000 mg | ORAL_TABLET | ORAL | Status: DC
  Administered 2020-07-09 – 2020-07-11 (×2): 7.5 mg via ORAL
  Filled 2020-07-09 (×3): qty 1

## 2020-07-09 NOTE — Progress Notes (Signed)
Patient's HR dropped down to 30s sustaining for a couple of secs. Patient is trending 48-52 currently, patient is alert and oriented x 4, denies chest pain or any blacks out, eating lunch. Florene Glen, MD is on the unit and is at bedside. EKG obtained and placed in chart.

## 2020-07-09 NOTE — Progress Notes (Signed)
ANTICOAGULATION CONSULT NOTE - Follow Up Consult  Pharmacy Consult for Warfarin Indication: atrial fibrillation  Allergies  Allergen Reactions  . Penicillins     Unknown reaction    Patient Measurements: Height: 5\' 8"  (172.7 cm) Weight: 81.1 kg (178 lb 12.7 oz) IBW/kg (Calculated) : 68.4  Vital Signs: Temp: 97.8 F (36.6 C) (04/20 1118) Temp Source: Oral (04/20 1118) BP: 104/61 (04/20 1118) Pulse Rate: 57 (04/20 1118)  Labs: Recent Labs    07/07/20 2241 07/08/20 0146 07/08/20 0346 07/08/20 1027 07/09/20 0442  HGB 10.7*  --   --   --  11.0*  HCT 33.4*  --   --   --  33.3*  PLT 300  --   --   --  300  LABPROT  --   --   --  23.9* 25.0*  INR  --   --   --  2.1* 2.3*  CREATININE 1.08  --   --   --  1.06  TROPONINIHS  --  14 15  --   --     Estimated Creatinine Clearance: 49.3 mL/min (by C-G formula based on SCr of 1.06 mg/dL).   Assessment:  56 yom on warfarin PTA for afib presenting with back, LOC. Pharmacy consulted to dose warfarin inpatient. No active bleed issues reported. Noted, patient on carbamazepine PTA (continued inpatient).   INR therapeutic (2.1) on admit and remains therapeutic (2.3) today after usual Tuesday warfarin dose of 11.25 mg.   PTA regimen: 7.5 mg MWF-Sunday and 11.25 mg TTS.   Evaluated by Neurosurgery, no surgery or injection planned.  Goal of Therapy:  INR 2-3 Monitor platelets by anticoagulation protocol: Yes   Plan:  Continue PTA warfarin regimen of 7.5 mg MWF-Sun (due today) and 11.25 mg TTS. Daily PT/INR for now. Monitor for s/sx bleeding.  Arty Baumgartner, RPh 07/09/2020,12:33 PM

## 2020-07-09 NOTE — Progress Notes (Signed)
Patient's daughter reached out with a concern about this admission etiology is from "normal pressure hydro-cephalus". This etiology was mentioned from a [facility] neurologist that consulted on the patient during his last admission. Patient's daughter would like an update from the neurologist and neurosurgeon about the MRI findings.

## 2020-07-09 NOTE — Evaluation (Addendum)
Occupational Therapy Evaluation Patient Details Name: [name] MRN: [id number] DOB: September 11, 1933 Today's Date: 07/09/2020    History of Present Illness The pt is an 87 yo male presenting 4/18 with "low HR" and "slower than normal responses" as well as some dizziness. Imaging revealed no acute abnormalities, significant multilevel spondylitic disease with significant stenosis particularly at L2-3 but also at L1-2, L3-4 and L4-5.  PMH includes: recent admission for progressive LE weakness and back pain, a flutter, BPH, CAD s/p MI and stenting, DM , HLD, HTN, meniere's disease, RTC repair, seizures, CVA with residual R hand weakness, HF.   Clinical Impression   Patient admitted for the diagnosis above.  Neuro on board for ? Seizures causing syncopal episode.  Subsequent low back dysfunction that Ortho MD was consulted on.  No plans for back surgeries.  PTA he walked with a 4WRW, and was generally independent with ADL and IADL at home.  Neither he or his wife drive, and family assists with community mobility and groceries.  His wife is unable to physically assist him, but family can support at home.  Barriers are listed below.  Currently he is needing Min Guard for in room mobility/toileting and basic ADL status.  OT to follow him in the acute setting to maximize his functional status, and assist with any recommendations for a transition home.        Follow Up Recommendations  Home health OT unless he goes to outpatient PT.     Equipment Recommendations  None recommended by OT    Recommendations for Other Services       Precautions / Restrictions Precautions Precautions: Fall Precaution Comments: history of falls Restrictions Weight Bearing Restrictions: No      Mobility Bed Mobility Overal bed mobility: Needs Assistance Bed Mobility: Rolling;Sidelying to Sit Rolling: Min guard Sidelying to sit: Min guard       General bed mobility comments: sitting EOB with PT     Transfers Overall transfer level: Needs assistance Equipment used: Rolling walker (2 wheeled) Transfers: Sit to/from Stand Sit to Stand: Min guard         General transfer comment: increased time to power up, but no physical assist needed. cues for posture in standing    Balance Overall balance assessment: Needs assistance Sitting-balance support: No upper extremity supported;Feet supported Sitting balance-Leahy Scale: Fair     Standing balance support: Bilateral upper extremity supported;No upper extremity supported;During functional activity Standing balance-Leahy Scale: Poor Standing balance comment: reliant on BUE support for gait, single UE suport for static activities standing at sink                           ADL either performed or assessed with clinical judgement   ADL Overall ADL's : Needs assistance/impaired Eating/Feeding: Modified independent;Sitting   Grooming: Wash/dry face;Wash/dry hands;Min guard;Standing   Upper Body Bathing: Sitting;Set up   Lower Body Bathing: Min guard;Sitting/lateral leans;Sit to/from stand   Upper Body Dressing : Modified independent;Sitting   Lower Body Dressing: Supervision/safety;Sitting/lateral leans   Toilet Transfer: Min guard;BSC;RW   Toileting- Clothing Manipulation and Hygiene: Min guard;Sit to/from stand;Sitting/lateral lean       Functional mobility during ADLs: Min guard;Rolling walker       Vision Baseline Vision/History: Wears glasses Wears Glasses: At all times Patient Visual Report: No change from baseline       Perception     Praxis      Pertinent Vitals/Pain Pain Assessment:  Faces Faces Pain Scale: Hurts little more Pain Location: RLE Pain Descriptors / Indicators: Aching;Discomfort;Grimacing Pain Intervention(s): Monitored during session     Hand Dominance Right   Extremity/Trunk Assessment Upper Extremity Assessment Upper Extremity Assessment: RUE deficits/detail RUE  Deficits / Details: chronic shoulder dysfunction - limited FF and ABd of shoulder RUE Sensation: WNL RUE Coordination: WNL   Lower Extremity Assessment Lower Extremity Assessment: Defer to PT evaluation RLE Deficits / Details: limited by pain with MMT and continued activity. pt reports pain from hip to toes RLE: Unable to fully assess due to pain   Cervical / Trunk Assessment Cervical / Trunk Assessment: Kyphotic   Communication Communication Communication: No difficulties   Cognition Arousal/Alertness: Awake/alert Behavior During Therapy: WFL for tasks assessed/performed Overall Cognitive Status: Within Functional Limits for tasks assessed                                 General Comments: pt with episode of incontinence during activity, reports he was able to feel incontinence but did not notify staff   General Comments  pt incontinent of urine in standing, reports he was able to feel incontinence, but did not alert staff. condom cath found to be off, RN alerted    Exercises     Shoulder Instructions      Home Living Family/patient expects to be discharged to:: Private residence Living Arrangements: Spouse/significant other Available Help at Discharge: Family;Available 24 hours/day Type of Home: House Home Access: Stairs to enter [employer] of Steps: 1 Entrance Stairs-Rails: Left;Right Home Layout: One level     Bathroom Shower/Tub: Occupational psychologist: Handicapped height Bathroom Accessibility: Yes How Accessible: Accessible via walker Home Equipment: [place] - 4 wheels;Shower seat;Cane - single point;Grab bars - toilet;Grab bars - tub/shower;Bedside commode;Walker - 2 wheels;Cane - quad;Hand held shower head          Prior Functioning/Environment Level of Independence: Independent with assistive device(s)        Comments: Pt reportes using rollator intermittently, when outside on uneven ground        OT Problem List:  Decreased strength;Decreased activity tolerance;Impaired balance (sitting and/or standing);Decreased safety awareness;Decreased knowledge of use of DME or AE      OT Treatment/Interventions: Therapeutic exercise;Self-care/ADL training;Neuromuscular education;Energy conservation;DME and/or AE instruction;Therapeutic activities;Patient/family education;Balance training    OT Goals(Current goals can be found in the care plan section) Acute Rehab OT Goals Patient Stated Goal: go home, get my legs stronger OT Goal Formulation: With patient Time For Goal Achievement: 07/23/20 Potential to Achieve Goals: Good ADL Goals Pt Will Perform Grooming: with modified independence;sitting;standing Pt Will Perform Lower Body Bathing: with modified independence;sit to/from stand Pt Will Perform Lower Body Dressing: with modified independence;sit to/from stand Pt Will Transfer to Toilet: with modified independence;regular height toilet;ambulating Pt Will Perform Toileting - Clothing Manipulation and hygiene: with modified independence;sit to/from stand  OT Frequency: Min 2X/week   Barriers to D/C:    none noted       Co-evaluation PT/OT/SLP Co-Evaluation/Treatment: Yes Reason for Co-Treatment: Complexity of the patient's impairments (multi-system involvement);For patient/therapist safety;To address functional/ADL transfers   OT goals addressed during session: ADL's and self-care      AM-PAC OT "6 Clicks" Daily Activity     Outcome Measure Help from another person eating meals?: None Help from another person taking care of personal grooming?: A Little Help from another person toileting, which includes using toliet, bedpan,  or urinal?: A Little Help from another person bathing (including washing, rinsing, drying)?: A Little Help from another person to put on and taking off regular upper body clothing?: A Little Help from another person to put on and taking off regular lower body clothing?: A Little 6  Click Score: 19   End of Session Equipment Utilized During Treatment: Rolling walker Nurse Communication: Mobility status;Other (comment)  Activity Tolerance: Patient tolerated treatment well Patient left: in chair;with call bell/phone within reach;with chair alarm set  OT Visit Diagnosis: Unsteadiness on feet (R26.81);Muscle weakness (generalized) (M62.81)                Time: 3507-5732 OT Time Calculation (min): 21 min Charges:  OT General Charges $OT Visit: 1 Visit OT Evaluation $OT Eval Moderate Complexity: 1 Mod  07/09/2020  Rich, OTR/L  Acute Rehabilitation Services  Office:  [phone]   Metta Clines 07/09/2020, 11:54 AM

## 2020-07-09 NOTE — Evaluation (Signed)
Physical Therapy Evaluation Patient Details Name: [name] MRN: [id number] DOB: 07-May-1933 Today's Date: 07/09/2020   History of Present Illness  The pt is an 87 yo male presenting 4/18 with "low HR" and "slower than normal responses" as well as some dizziness. Imaging revealed no acute abnormalities, significant multilevel spondylitic disease with significant stenosis particularly at L2-3 but also at L1-2, L3-4 and L4-5.  PMH includes: recent admission for progressive LE weakness and back pain, a flutter, BPH, CAD s/p MI and stenting, DM , HLD, HTN, meniere's disease, RTC repair, seizures, CVA with residual R hand weakness, HF.    Clinical Impression  Pt in bed upon arrival of PT, agreeable to evaluation at this time. Prior to admission the pt was mobilizing with use of rollator at home, reports some assist needed from wife for ADLs. The pt now presents with limitations in functional mobility, strength, power, endurance, and stability due to above dx, and will continue to benefit from skilled PT to address these deficits. The pt was able to complete initial bed mobility and hallway ambulation with minG and use of RW, but demos poor posture, positioning in RW, gait speed, stride length, and reports increasing pain in RLE with continued activity. The pt will continue to benefit from skilled PT to progress deficits in functional mobility, as well as OPPT to address spinal/LE pain due to current plan for conservative management at this point. Pt unsure if he would have access to transportation, at time of evaluation, if he is unable to arrange transportation, recommend HHPT.      Follow Up Recommendations Outpatient PT;Supervision for mobility/OOB (OPPT for back and LE pain if rides can be arranged (pt unsure at eval), if not able to arrange transporation, then HHPT)    Equipment Recommendations  None recommended by PT    Recommendations for Other Services       Precautions / Restrictions  Precautions Precautions: Fall Precaution Comments: history of falls Restrictions Weight Bearing Restrictions: No      Mobility  Bed Mobility Overal bed mobility: Needs Assistance Bed Mobility: Rolling;Sidelying to Sit Rolling: Min guard Sidelying to sit: Min guard       General bed mobility comments: minG for log roll cues, pt able to complete without physical assist    Transfers Overall transfer level: Needs assistance Equipment used: Rolling walker (2 wheeled) Transfers: Sit to/from Stand Sit to Stand: Min guard         General transfer comment: increased time to power up, but no physical assist needed. cues for posture in standing  Ambulation/Gait Ambulation/Gait assistance: Min guard Gait Distance (Feet): 75 Feet Assistive device: Rolling walker (2 wheeled) Gait Pattern/deviations: Step-through pattern;Decreased stride length;Trunk flexed;Decreased step length - left Gait velocity: 0.3 m/s Gait velocity interpretation: <1.31 ft/sec, indicative of household ambulator General Gait Details: decreased stride length, painful RLE with decreased stride length, wt shift, and wt acceptance. significant hip external rotation bilaterally. cues for posture, positioning in RW      Balance Overall balance assessment: Needs assistance Sitting-balance support: No upper extremity supported;Feet supported Sitting balance-Leahy Scale: Fair     Standing balance support: Bilateral upper extremity supported;No upper extremity supported;During functional activity Standing balance-Leahy Scale: Poor Standing balance comment: reliant on BUE support for gait, single UE suport for static activities standing at sink                             Pertinent Vitals/Pain Pain  Assessment: Faces Faces Pain Scale: Hurts little more Pain Location: RLE Pain Descriptors / Indicators: Aching;Discomfort;Grimacing Pain Intervention(s): Limited activity within patient's  tolerance;Monitored during session;Repositioned    Home Living Family/patient expects to be discharged to:: Private residence Living Arrangements: Spouse/significant other Available Help at Discharge: Family;Available 24 hours/day Type of Home: House Home Access: Stairs to enter Entrance Stairs-Rails: Chemical engineer of Steps: 1 Home Layout: One level Home Equipment: Walker - 4 wheels;Shower seat;Cane - single point;Grab bars - toilet;Grab bars - tub/shower;Bedside commode;Walker - 2 wheels;Cane - quad;Hand held shower head      Prior Function Level of Independence: Independent with assistive device(s)         Comments: Pt reportes using rollator intermittently, when outside on uneven ground     Hand Dominance   Dominant Hand: Right    Extremity/Trunk Assessment   Upper Extremity Assessment Upper Extremity Assessment: Defer to OT evaluation (limited ROM to R shoulder)    Lower Extremity Assessment Lower Extremity Assessment: RLE deficits/detail;Generalized weakness RLE Deficits / Details: limited by pain with MMT and continued activity. pt reports pain from hip to toes RLE: Unable to fully assess due to pain    Cervical / Trunk Assessment Cervical / Trunk Assessment: Kyphotic  Communication   Communication: No difficulties  Cognition Arousal/Alertness: Awake/alert Behavior During Therapy: WFL for tasks assessed/performed Overall Cognitive Status: Within Functional Limits for tasks assessed                                 General Comments: pt with episode of incontinence during activity, reports he was able to feel incontinence but did not notify staff      General Comments General comments (skin integrity, edema, etc.): pt incontinent of urine in standing, reports he was able to feel incontinence, but did not alert staff. condom cath found to be off, RN alerted    Exercises     Assessment/Plan    PT Assessment Patient needs  continued PT services  PT Problem List Decreased strength;Decreased mobility;Decreased safety awareness;Decreased activity tolerance;Decreased balance;Decreased knowledge of use of DME;Pain       PT Treatment Interventions DME instruction;Therapeutic activities;Gait training;Patient/family education;Therapeutic exercise;Stair training;Balance training;Functional mobility training;Neuromuscular re-education    PT Goals (Current goals can be found in the Care Plan section)  Acute Rehab PT Goals Patient Stated Goal: go home, get my legs stronger PT Goal Formulation: With patient Time For Goal Achievement: 07/23/20 Potential to Achieve Goals: Good    Frequency Min 3X/week    AM-PAC PT "6 Clicks" Mobility  Outcome Measure Help needed turning from your back to your side while in a flat bed without using bedrails?: A Little Help needed moving from lying on your back to sitting on the side of a flat bed without using bedrails?: A Little Help needed moving to and from a bed to a chair (including a wheelchair)?: A Little Help needed standing up from a chair using your arms (e.g., wheelchair or bedside chair)?: A Little Help needed to walk in hospital room?: A Little Help needed climbing 3-5 steps with a railing? : A Little 6 Click Score: 18    End of Session Equipment Utilized During Treatment: Gait belt Activity Tolerance: Patient tolerated treatment well Patient left: in chair (with OT) Nurse Communication: Mobility status PT Visit Diagnosis: Other abnormalities of gait and mobility (R26.89);Difficulty in walking, not elsewhere classified (R26.2)    Time: 9357-0177 PT Time Calculation (  min) (ACUTE ONLY): 15 min   Charges:   PT Evaluation $PT Eval Moderate Complexity: 1 Mod          Karma Ganja, PT, DPT   Acute Rehabilitation Department Pager #: [phone]  Otho Bellows 07/09/2020, 11:14 AM

## 2020-07-09 NOTE — Procedures (Signed)
Patient Name: [name]  MRN: [id number]  Epilepsy Attending: Lora Havens  Referring Physician/Provider: Dr. Karmen Bongo Date: 07/08/2020 Duration: 24.15 mins  Patient history: 87 year old male with prior history of stroke as well as seizure presented with transient episode of alteration of awareness.  EEG to evaluate for seizures.  Level of alertness: Awake  AEDs during EEG study: Carbamazepine, Keppra  Technical aspects: This EEG study was done with scalp electrodes positioned according to the 10-20 International system of electrode placement. Electrical activity was acquired at a sampling rate of 500Hz  and reviewed with a high frequency filter of 70Hz  and a low frequency filter of 1Hz . EEG data were recorded continuously and digitally stored.   Description: The posterior dominant rhythm consists of 8 Hz activity of moderate voltage (25-35 uV) seen predominantly in posterior head regions, symmetric and reactive to eye opening and eye closing. EEG showed intermittent generalized 3 to 6 Hz theta-delta slowing. Physiologic photic driving was not seen during photic stimulation.  Hyperventilation was not performed.     ABNORMALITY - Intermittent slow, generalized  IMPRESSION: This study is suggestive of mild diffuse encephalopathy, nonspecific etiology. No seizures or epileptiform discharges were seen throughout the recording.  Keirah Konitzer Barbra Sarks

## 2020-07-09 NOTE — Consult Note (Signed)
Cardiology Consultation:   Patient ID: [name] MRN: [id number]; DOB: 1933-09-07  Admit date: 07/07/2020 Date of Consult: 07/09/2020  PCP:  [name], MD   [place]  Cardiologist:  Minus Breeding, MD  Advanced Practice Provider:  No care team member to display Electrophysiologist:  None   Patient Profile:   [name] is a 87 y.o. male with a PMH of CAD s/p PCI/DES to RCA in [id number], chronic diastolic CHF, paroxysmal atrial fibrillation/flutter, carotid artery disease, HTN, HLD, DM type 2, CVA, seizure disorder, and hypothyroidism who is being seen today for the evaluation of bradycardia and episode of unresponsiveness at the request of Dr. Florene Glen.  History of Present Illness:   [name] was admitted to the hospital from 06/29/20-07/01/20 after presenting with generalized weakness. He was transferred from [facility] to [facility] for MRI imaging of his L spine which showed inflammation in the L1-2 disc and medial left psoas without abscess. Inflammatory markers were wnl and remainder of work up was benign. He was discharged home, though weakness persisted. He reported increased back pain since discharge home. On the evening of 07/07/20 he was eating dinner with family when he had an episode of unresponsiveness lasting 30-45 seconds. HR was reportedly in the 50s at that time and c/f low BP as well though exact numbers not recalled. EMS was activated and he received IVF's en route to the ED.   Patient has had prior trouble with bradycardia/syncope. Appears metoprolol was discontinued in the past, not listed on medications at his last evaluation by cardiology 12/2019, however subsequent telephone encounter from PCP's office instructed him to restart metoprolol 03/2020 for unclear reasons. Prior to this admission, his last echocardiogram 05/2018 showed EF 60-65%, G1DD, mild LAE, and moderate MAC without significant valvular dysfunction. His last ischemic evaluation  appears to be a cardiac cath in 2014 which showed patent RCA stent, 40-50% pLAD stenosis, 70-80% D1 stenosis, 40% OM1 stenosis, and 30% mRCA stenosis which was medically managed.   At the time of this evaluation he is feeling back to his usual self. No family at bedside at this time. He does not recall any symptoms of chest pain, SOB, palpitations, dizziness, lightheadedness, syncope, nausea, or vomiting surrounding his episode 07/07/20. He denies recent trouble with orthopnea, PND, or LE edema. Asked specifically about any symptoms this afternoon around 12:30pm - he again denied any issues with dizziness, lightheadedness, palpitations, syncope, nausea, vomiting, chest pain, or SOB.   Hospital course: Vitals with BP intermittently elevated, Hr 50s-60s, otherwise unremarkable. Labs notable for electrolytes wnl, Cr 1.0, Hgb 11.0, PLT 300, HsTrop negative x2, INR therapeutic at 2.3, TSH 4.9. EKG showed sinus bradycardia with 1st degree AV block, rate 58 bpm, no STE/D, no TWI, no significant change from previous. CXR showed low volumes and atelectasis. CT Head without acute findings; chronic microvascular disease. MR Lumbar spine showed multilevel severe spondylosis with spinal canal stenosis worse at L2-3 and L4-5. He was seen by neurosurgery given c/f possible early osteomyelitis discitis at L1-2, though was felt that decompressive surgery would be unlikely to improve his symptoms and he was recommended for follow-up MRI outpatient. He underwent EEG which did not reveal any seizure activity. Cardiology asked to evaluate for bradycardia and unresponsiveness.   Past Medical History:  Diagnosis Date  . Atrial flutter ([place])   . BPH (benign prostatic hyperplasia)   . Coronary artery disease    a. s/p MI tx with Cypher DES to pRCA in  4/04;  b. Echocardiogram 7/09: Normal LV function.  c. Nuclear study 3/13 no ischemia;  d. ETT 2/14 neg;  e. admx with CP => [facility] (01/30/2013):  pLAD 40-50%, oD1 70-80%, oOM1 40%, pRCA  stent patent, mid RCA 30%, EF 60-65%. => med Rx.  . DDD (degenerative disc disease)   . Diabetes mellitus without complication ([place])   . Dyslipidemia   . Gait disorder 07/13/2018  . Hemorrhoids   . Hx MRSA infection    left buttocks abscess  . Hx of echocardiogram    a. Echocardiogram (01/31/2013): Mild focal basal and mild concentric hypertrophy of the septum, EF 50-55%, normal wall motion, grade 1 diastolic dysfunction, trivial AI, MAC, mild LAE, PASP 35  . Hyperlipidemia   . Hypertension   . Hypothyroidism   . Meniere's disease    Status post shunt  . Myocardial infarction ([place]) 2008  . Occlusion and stenosis of carotid artery without mention of cerebral infarction    40-59% on carotid doppler 2014; [place] (01/2013): R 1-39%, L 60-79%  . Rotator cuff injury    chronic rotator cuff injury status post repair  . Seizure disorder ([place])   . Stroke [facility])    a. 01/2013=> post cardiac cath CVA to L post communicating artery system; R sided weakness  . Syncope     Past Surgical History:  Procedure Laterality Date  . COLONOSCOPY    . CORONARY ANGIOPLASTY WITH STENT PLACEMENT  07/14/2002   Stent to the right coronary artery  . ENDOLYMPHATIC SHUNT DECOMPRESSION  06/11/2009    Right endolymphatic sac decompression and shunt  placement  . HERNIA REPAIR  04/10/2008   scrotal hernia repair  . LEFT HEART CATHETERIZATION WITH CORONARY ANGIOGRAM N/A 01/30/2013   Procedure: LEFT HEART CATHETERIZATION WITH CORONARY ANGIOGRAM;  Surgeon: Larey Dresser, MD;  Location: [facility] CATH LAB;  Service: Cardiovascular;  Laterality: N/A;  . ROTATOR CUFF REPAIR     for chronic rotator cuff injury     Home Medications:  Prior to Admission medications   Medication Sig Start Date End Date Taking? Authorizing Provider  acetaminophen (TYLENOL) 325 MG tablet Take 1-2 tablets (325-650 mg total) by mouth every 4 (four) hours as needed for mild pain. 06/15/18  Yes Love, Ivan Anchors, PA-C  albuterol (VENTOLIN HFA) 108 (90  Base) MCG/ACT inhaler Inhale 2 puffs into the lungs every 4 (four) hours as needed for wheezing or shortness of breath. 12/27/19  Yes [name], MD  atorvastatin (LIPITOR) 80 MG tablet TAKE 1 TABLET BY MOUTH EVERY DAY 6PM Patient taking differently: Take 80 mg by mouth every evening. 02/18/20  Yes [name], MD  carbamazepine (CARBATROL) 300 MG 12 hr capsule TAKE 1 CAPSULE BY MOUTH TWICE A DAY Patient taking differently: Take 300 mg by mouth 2 (two) times daily. 12/28/19  Yes [name], MD  cloNIDine (CATAPRES) 0.2 MG tablet TAKE 1 TABLET(0.2 MG) BY MOUTH TWICE DAILY Patient taking differently: Take 0.2 mg by mouth 2 (two) times daily. 06/17/20  Yes [name], MD  escitalopram (LEXAPRO) 5 MG tablet TAKE 1 TABLET BY MOUTH EVERYDAY AT BEDTIME Patient taking differently: Take 5 mg by mouth at bedtime. 12/27/19  Yes [name], MD  furosemide (LASIX) 20 MG tablet Take 0.5 tablets (10 mg total) by mouth daily. 12/27/19  Yes [name], MD  glucose blood test strip 1 each by Other route as needed. Use as instructed 08/25/18  Yes [place], Modena Nunnery, MD  glucose monitoring  kit (FREESTYLE) monitoring kit 1 each by Does not apply route as needed. 08/25/18  Yes [place], Modena Nunnery, MD  HYDROcodone-acetaminophen (NORCO) 5-325 MG tablet Take 1 tablet by mouth every 6 (six) hours as needed for moderate pain. 07/07/20  Yes [name], MD  isosorbide mononitrate (IMDUR) 60 MG 24 hr tablet Take 0.5 tablets (30 mg total) by mouth daily. Patient taking differently: Take 60 mg by mouth daily. 07/01/20  Yes Lorella Nimrod, MD  Lancets (ACCU-CHEK SAFE-T PRO) lancets 1 each by Other route as needed. Use as instructed 08/25/18  Yes [place], Modena Nunnery, MD  levETIRAcetam (KEPPRA) 250 MG tablet Take 1 tablet (250 mg total) by mouth 2 (two) times daily. 12/27/19  Yes [name], MD  levothyroxine (SYNTHROID) 75 MCG tablet TAKE 1 TABLET(75 MCG) BY MOUTH DAILY Patient taking differently:  Take 75 mcg by mouth daily before breakfast. 06/13/20  Yes [name], MD  losartan (COZAAR) 100 MG tablet Take 0.5 tablets (50 mg total) by mouth daily. 07/01/20  Yes Lorella Nimrod, MD  metFORMIN (GLUCOPHAGE) 500 MG tablet Take 500 mg by mouth 2 (two) times daily with a meal.   Yes [provider]  metoprolol tartrate (LOPRESSOR) 100 MG tablet TAKE 1 TABLET(100 MG) BY MOUTH TWICE DAILY Patient taking differently: Take 100 mg by mouth 2 (two) times daily. 04/15/20  Yes [name], MD  Multiple Vitamins-Minerals (MULTIVITAMINS THER. W/MINERALS) TABS Take 1 tablet by mouth at bedtime.     Yes [provider]  tamsulosin (FLOMAX) 0.4 MG CAPS capsule Take 1 capsule (0.4 mg total) by mouth daily after supper. 07/01/20  Yes Lorella Nimrod, MD  warfarin (COUMADIN) 7.5 MG tablet TAKE 1&1/2 TABS TUESDAY, THURSDAY & SATURDAY*1 TAB ON SUNDAY, MONDAY, [place] SUPPER Patient taking differently: Take 7.5-11.25 mg by mouth See admin instructions. 1&1/2 tab Tuesday,Thursday,Saturday. 1 tab Monday,Wednesday,Friday and sunday 12/27/19  Yes [name], MD    Inpatient Medications: Scheduled Meds: . atorvastatin  80 mg Oral QPM  . carbamazepine  300 mg Oral BID  . cloNIDine  0.2 mg Oral BID  . escitalopram  5 mg Oral QHS  . insulin aspart  0-15 Units Subcutaneous TID WC  . insulin aspart  0-5 Units Subcutaneous QHS  . isosorbide mononitrate  60 mg Oral Daily  . levETIRAcetam  250 mg Oral BID  . levothyroxine  75 mcg Oral QAC breakfast  . lidocaine  1 patch Transdermal Q24H  . losartan  50 mg Oral Daily  . sodium chloride flush  3 mL Intravenous Q12H  . tamsulosin  0.4 mg Oral QPC supper  . warfarin  7.5 mg Oral Q M,W,F,Su-1800   And  . [START ON 07/10/2020] warfarin  11.25 mg Oral Q T,Th,Sat-1800  . Warfarin - Pharmacist Dosing Inpatient   Does not apply q1600   Continuous Infusions: . lactated ringers 75 mL/hr at 07/09/20 0613   PRN Meds: acetaminophen  **OR** acetaminophen, albuterol, HYDROcodone-acetaminophen, ondansetron **OR** ondansetron (ZOFRAN) IV  Allergies:    Allergies  Allergen Reactions  . Penicillins     Unknown reaction    Social History:   Social History   Socioeconomic History  . Marital status: Married    Spouse name: Not on file  . Number of children: 4  . Years of education: 12th  . Highest education level: Not on file  Occupational History  . Occupation: Retired  Tobacco Use  . Smoking status: Former Smoker    Years: 5.00  Types: Cigarettes    Quit date: 08/19/1956    Years since quitting: 63.9  . Smokeless tobacco: Former Systems developer    Types: Secondary school teacher  . Vaping Use: Never used  Substance and Sexual Activity  . Alcohol use: No  . Drug use: No  . Sexual activity: Never  Other Topics Concern  . Not on file  Social History Narrative   Lives with wife.   Social Determinants of Health   Financial Resource Strain: Not on file  Food Insecurity: Not on file  Transportation Needs: Not on file  Physical Activity: Not on file  Stress: Not on file  Social Connections: Not on file  Intimate Partner Violence: Not on file    Family History:    Family History  Problem Relation Age of Onset  . Heart attack Father 71  . Aneurysm Mother        brain aneurysm  . Coronary artery disease Brother        with CABG  . Diabetes Brother   . Colon cancer Neg Hx   . Colon polyps Neg Hx      ROS:  Please see the history of present illness.   All other ROS reviewed and negative.     Physical Exam/Data:   Vitals:   07/09/20 0723 07/09/20 0836 07/09/20 1118 07/09/20 1601  BP: (!) 153/120 (!) 129/58 104/61 (!) 150/70  Pulse: 66 63 (!) 57 62  Resp: _0 Temp: 98.1 F (36.7 C)  97.8 F (36.6 C) 98 F (36.7 C)  TempSrc: Oral  Oral Oral  SpO2: 99% 99% 100% 95%  Weight:      Height:        Intake/Output Summary (Last 24 hours) at 07/09/2020 1625 Last data filed at 07/09/2020 1531 Gross per  24 hour  Intake 2533.1 ml  Output 625 ml  Net 1908.1 ml   Last 3 Weights 07/09/2020 07/07/2020 06/29/2020  Weight (lbs) 178 lb 12.7 oz 173 lb 173 lb  Weight (kg) 81.1 kg 78.472 kg 78.472 kg     Body mass index is 27.19 kg/m.  General:  Well nourished, well developed, in no acute distress HEENT: sclera anicteric Neck: no JVD Vascular: No carotid bruits; distal pulses 2+ bilaterally  Cardiac:  normal S1, S2; RRR; no murmurs, rubs, or gallops Lungs:  clear to auscultation bilaterally, no wheezing, rhonchi or rales  Abd: soft, nontender, no hepatomegaly  Ext: no edema Musculoskeletal:  No deformities, BUE and BLE strength normal and equal Skin: warm and dry  Neuro:  CNs 2-12 intact, no focal abnormalities noted Psych:  Normal affect   EKG:  The EKG was personally reviewed and demonstrates:  sinus bradycardia with 1st degree AV block, rate 58 bpm, no STE/D, no TWI, no significant change from previous. Telemetry:  Telemetry was personally reviewed and demonstrates:  Sinus rhythm with intermittent bradycardia to the 50s. He does have evidence of 2nd degree AV block type 2, and some concern for CHB around 12:30pm this afternoon.   Relevant CV Studies: Left heart catheterization 2014: Final Conclusions:  Patent RCA stent.  There was 40-50% proximal LAD stenosis at D1 with 70-80% ostial D1 stenosis.  D1 disease looks like it could potentially cause exertional chest pain, but would be surprised if this could have caused the 2 hours of chest pain at rest he had yesterday.  I reviewed with Dr. [place]: intervention would be difficult given the disease in the LAD at D1 take-off,  could potentially perform balloon angioplasty.  Will plan on medical management.  Will add Imdur to regimen.  If he has further chest pain, could re-visit intervention on D1.    Laboratory Data:  High Sensitivity Troponin:   Recent Labs  Lab 06/29/20 2100 06/29/20 2304 07/08/20 0146 07/08/20 0346  TROPONINIHS 37* 34*  14 15     Chemistry Recent Labs  Lab 07/07/20 2241 07/09/20 0442  NA 135 135  K 4.0 4.4  CL 103 101  CO2 29 29  GLUCOSE 124* 109*  BUN 23 18  CREATININE 1.08 1.06  CALCIUM 7.9* 8.5*  GFRNONAA >60 >60  ANIONGAP 3* 5    Recent Labs  Lab 07/07/20 2241  PROT 5.2*  ALBUMIN 3.0*  AST 16  ALT 15  ALKPHOS 62  BILITOT 0.4   Hematology Recent Labs  Lab 07/07/20 2241 07/09/20 0442  WBC 7.8 8.2  RBC 3.45* 3.48*  HGB 10.7* 11.0*  HCT 33.4* 33.3*  MCV 96.8 95.7  MCH 31.0 31.6  MCHC 32.0 33.0  RDW 15.1 15.2  PLT 300 300   BNPNo results for input(s): BNP, PROBNP in the last 168 hours.  DDimer No results for input(s): DDIMER in the last 168 hours.   Radiology/Studies:  CT Head Wo Contrast  Result Date: 07/08/2020 CLINICAL DATA:  Syncope EXAM: CT HEAD WITHOUT CONTRAST TECHNIQUE: Contiguous axial images were obtained from the base of the skull through the vertex without intravenous contrast. COMPARISON:  06/29/2020 FINDINGS: Brain: Old left thalamic lacunar infarct. There is atrophy and chronic small vessel disease changes. No acute intracranial abnormality. Specifically, no hemorrhage, hydrocephalus, mass lesion, acute infarction, or significant intracranial injury. Vascular: No hyperdense vessel or unexpected calcification. Skull: No acute calvarial abnormality. Sinuses/Orbits: No acute findings Other: None IMPRESSION: Atrophy, chronic microvascular disease. No acute intracranial abnormality. Electronically Signed   By: Rolm Baptise M.D.   On: 07/08/2020 02:28   MR Lumbar Spine W Wo Contrast  Result Date: 07/08/2020 CLINICAL DATA:  Severe low back pain EXAM: MRI LUMBAR SPINE WITHOUT AND WITH CONTRAST TECHNIQUE: Multiplanar and multiecho pulse sequences of the lumbar spine were obtained without and with intravenous contrast. CONTRAST:  7.84m GADAVIST GADOBUTROL 1 MMOL/ML IV SOLN COMPARISON:  06/30/2020 FINDINGS: Segmentation:  Standard. Alignment:  Physiologic. Vertebrae:  Hyperintense T2-weighted signal within the endplates at LX1-0is unchanged. There is mild contrast enhancement. No enhancement in the disc space. Conus medullaris and cauda equina: Conus extends to the L1 level. Conus and cauda equina appear normal. Paraspinal and other soft tissues: Negative. Disc levels: Compared to the MRI the lumbar spine performed 06/30/2020, there is no change in the degree of multilevel severe spondylosis. Spinal canal stenosis remains worst at L2-3 and L4-5. IMPRESSION: 1. Unchanged appearance of hyperintense T2-weighted signal within the endplates at LG2-6 No contrast enhancement in the disc space. Findings are most likely degenerative. 2. Unchanged multilevel severe spondylosis with spinal canal stenosis worst at L2-3 and L4-5. Electronically Signed   By: KUlyses JarredM.D.   On: 07/08/2020 19:07   DG Chest Portable 1 View  Result Date: 07/08/2020 CLINICAL DATA:  Cough EXAM: PORTABLE CHEST 1 VIEW COMPARISON:  06/29/2020 radiograph, 07/02/2011 CT FINDINGS: Low volumes and streaky opacities favoring atelectatic change. No consolidation, features of edema, pneumothorax, or effusion. Pulmonary vascularity is normally distributed. The cardiomediastinal contours are stable from priors with cardiomegaly and aortic atherosclerosis. No acute osseous or soft tissue abnormality. Degenerative changes are present in the imaged spine and shoulders. Telemetry leads overlie the  chest. IMPRESSION: 1. Low volumes and streaky opacities favoring atelectatic change. 2. Stable cardiomegaly. 3.  Aortic Atherosclerosis (ICD10-I70.0). Electronically Signed   By: Lovena Le M.D.   On: 07/08/2020 02:07   EEG adult  Result Date: 07/09/2020 Lora Havens, MD     07/09/2020  8:53 AM Patient Name: [name] MRN: [id number] Epilepsy Attending: Lora Havens Referring Physician/Provider: Dr. Karmen Bongo Date: 07/08/2020 Duration: 24.15 mins Patient history: 87 year old male with prior history of  stroke as well as seizure presented with transient episode of alteration of awareness.  EEG to evaluate for seizures. Level of alertness: Awake AEDs during EEG study: Carbamazepine, Keppra Technical aspects: This EEG study was done with scalp electrodes positioned according to the 10-20 International system of electrode placement. Electrical activity was acquired at a sampling rate of _0  and reviewed with a high frequency filter of _1  and a low frequency filter of _2 . EEG data were recorded continuously and digitally stored. Description: The posterior dominant rhythm consists of 8 Hz activity of moderate voltage (25-35 uV) seen predominantly in posterior head regions, symmetric and reactive to eye opening and eye closing. EEG showed intermittent generalized 3 to 6 Hz theta-delta slowing. Physiologic photic driving was not seen during photic stimulation.  Hyperventilation was not performed.   ABNORMALITY - Intermittent slow, generalized IMPRESSION: This study is suggestive of mild diffuse encephalopathy, nonspecific etiology. No seizures or epileptiform discharges were seen throughout the recording. Lora Havens   ECHOCARDIOGRAM COMPLETE  Result Date: 07/08/2020    ECHOCARDIOGRAM REPORT   Patient Name:   [name] Date of Exam: 07/08/2020 Medical Rec #:  [id number]        Height:       68.0 in Accession #:    [id number]       Weight:       173.0 lb Date of Birth:  08/12/1933         BSA:          1.922 m Patient Age:    68 years         BP:           184/86 mmHg Patient Gender: M                HR:           64 bpm. Exam Location:  Inpatient Procedure: 2D Echo, Cardiac Doppler, Color Doppler and Intracardiac            Opacification Agent Indications:    Syncope R55  History:        Patient has prior history of Echocardiogram examinations, most                 recent 05/30/2018. CAD, Stroke and Carotid Disease,                 Arrythmias:LBBB; Risk Factors:Hypertension, Diabetes and                  Dyslipidemia. Meniere's disease. History of Aflutter.  Sonographer:    Darlina Sicilian RDCS Referring Phys: [place]  1. Left ventricular ejection fraction, by estimation, is 50 to 55%. The left ventricle has low normal function. The left ventricle has no regional wall motion abnormalities. There is mild left ventricular hypertrophy. Left ventricular diastolic parameters are consistent with Grade I diastolic dysfunction (impaired relaxation). Elevated left atrial pressure.  2. Right ventricular systolic function is normal. The right ventricular size is normal. There  is mildly elevated pulmonary artery systolic pressure.  3. Left atrial size was severely dilated.  4. The mitral valve is normal in structure. Trivial mitral valve regurgitation. No evidence of mitral stenosis. Moderate mitral annular calcification.  5. The aortic valve is tricuspid. There is mild calcification of the aortic valve. There is moderate thickening of the aortic valve. Aortic valve regurgitation is not visualized. Mild to moderate aortic valve sclerosis/calcification is present, without any evidence of aortic stenosis.  6. There is mild dilatation of the ascending aorta, measuring 39 mm. FINDINGS  Left Ventricle: Left ventricular ejection fraction, by estimation, is 50 to 55%. The left ventricle has low normal function. The left ventricle has no regional wall motion abnormalities. Definity contrast agent was given IV to delineate the left ventricular endocardial borders. The left ventricular internal cavity size was normal in size. There is mild left ventricular hypertrophy. Left ventricular diastolic parameters are consistent with Grade I diastolic dysfunction (impaired relaxation). Elevated left atrial pressure. Right Ventricle: The right ventricular size is normal. No increase in right ventricular wall thickness. Right ventricular systolic function is normal. There is mildly elevated pulmonary artery systolic  pressure. The tricuspid regurgitant velocity is 2.51  m/s, and with an assumed right atrial pressure of 8 mmHg, the estimated right ventricular systolic pressure is 07.6 mmHg. Left Atrium: Left atrial size was severely dilated. Right Atrium: Right atrial size was normal in size. Pericardium: There is no evidence of pericardial effusion. Mitral Valve: The mitral valve is normal in structure. Moderate mitral annular calcification. Trivial mitral valve regurgitation. No evidence of mitral valve stenosis. Tricuspid Valve: The tricuspid valve is normal in structure. Tricuspid valve regurgitation is mild. Aortic Valve: The aortic valve is tricuspid. There is mild calcification of the aortic valve. There is moderate thickening of the aortic valve. Aortic valve regurgitation is not visualized. Mild to moderate aortic valve sclerosis/calcification is present, without any evidence of aortic stenosis. Aortic valve mean gradient measures 7.2 mmHg. Aortic valve peak gradient measures 12.5 mmHg. Aortic valve area, by VTI measures 1.80 cm. Pulmonic Valve: The pulmonic valve was not well visualized. Pulmonic valve regurgitation is not visualized. Aorta: The aortic root is normal in size and structure. There is mild dilatation of the ascending aorta, measuring 39 mm. IAS/Shunts: No atrial level shunt detected by color flow Doppler.  LEFT VENTRICLE PLAX 2D LVIDd:         4.90 cm      Diastology LVIDs:         3.90 cm      LV e' medial:    3.92 cm/s LV PW:         1.20 cm      LV E/e' medial:  18.6 LV IVS:        1.30 cm      LV e' lateral:   5.33 cm/s LVOT diam:     2.20 cm      LV E/e' lateral: 13.7 LV SV:         76 LV SV Index:   39 LVOT Area:     3.80 cm  LV Volumes (MOD) LV vol d, MOD A2C: 125.0 ml LV vol d, MOD A4C: 129.0 ml LV vol s, MOD A2C: 65.1 ml LV vol s, MOD A4C: 65.6 ml LV SV MOD A2C:     59.9 ml LV SV MOD A4C:     129.0 ml LV SV MOD BP:      67.5 ml RIGHT VENTRICLE RV S prime:  12.30 cm/s TAPSE (M-mode): 2.4 cm  LEFT ATRIUM             Index       RIGHT ATRIUM           Index LA diam:        4.00 cm 2.08 cm/m  RA Area:     16.20 cm LA Vol (A2C):   86.3 ml 44.91 ml/m RA Volume:   38.50 ml  20.04 ml/m LA Vol (A4C):   97.8 ml 50.90 ml/m LA Biplane Vol: 92.8 ml 48.29 ml/m  AORTIC VALVE AV Area (Vmax):    1.68 cm AV Area (Vmean):   1.67 cm AV Area (VTI):     1.80 cm AV Vmax:           176.80 cm/s AV Vmean:          125.200 cm/s AV VTI:            0.421 m AV Peak Grad:      12.5 mmHg AV Mean Grad:      7.2 mmHg LVOT Vmax:         78.30 cm/s LVOT Vmean:        54.900 cm/s LVOT VTI:          0.199 m LVOT/AV VTI ratio: 0.47  AORTA Ao Root diam: 3.60 cm Ao Asc diam:  3.80 cm MITRAL VALVE               TRICUSPID VALVE MV Area (PHT): 2.45 cm    TR Peak grad:   25.2 mmHg MV Decel Time: 310 msec    TR Vmax:        251.00 cm/s MV E velocity: 72.80 cm/s MV A velocity: 87.00 cm/s  SHUNTS MV E/A ratio:  0.84        Systemic VTI:  0.20 m                            Systemic Diam: 2.20 cm [name] Croitoru MD Electronically signed by Sanda Klein MD Signature Date/Time: 07/08/2020/2:10:02 PM    Final      Assessment and Plan:   1. High-grade AV block: patient presented after a <1 minute episode of unresponsiveness while eating dinner 07/07/20. No further episodes this admission. Patient denies any associated symptoms surrounding the episode. EKG on arrival showed sinus brady with 1st degree AV block with rate 58bpm and non-ischemic. Telemetry relatively benign until around 12:30pm today when he began experiencing episodes of 2nd degree AV block type 2 and perhaps even a brief spell of CHB - patient was completely asymptomatic during this time. Suspect this is related to his metoprolol which was discontinued in the past due to issues with bradycardia/pauses, however appears to have been restarted by his PCP 03/2020 for unclear reasons.  - Agree with discontinuing metoprolol - If episodes persist, would need to consider alternative  antihypertensive to clonidine which can also contribute to bradycardia  - Will need to arrange a zio patch monitor prior to discharge to evaluate for resolution of high-grade AV block  2. CAD s/p PCI/DES to RCA in 2004: no recent anginal complaints. HsTrop negative x2. EKG is non-ischemic. Echo with EF 50-55%, G1DD, mild LVH, severe LAE, and moderate MAC without significant valvular dysfunction. Not on aspirin given need for coumadin. Would discontinue metoprolol given #1 - Continue statin  - Continue imdur  3. Paroxysmal atrial fibrillation/flutter: no evidence of recurrence on  tele this admission.  - Continue coumadin for stoke ppx  4. Chronic diastolic CHF: no recent volume overload complaints and he appears euvolemic on exam. Some concern for poor fluid intake at home. He is receiving IVFs at this time - Continue to monitor volume status closely - Can restart home po lasix as needed - consider prn dosing at discharge  5. HTN: BP intermittently elevated, though overall improved from admission. Home metoprolol stopped.  - Continue losartan, clonidine, and imdur - Low threshold to consider alternative to clonidine if bradycardia persists  Remainder of care per primary team: - Chronic back pain/spinal stenosis - History of CVA - History of seizure disorder - Hypothyroidism - DM type 2    Risk Assessment/Risk Scores:  New York Heart Association (NYHA) Functional Class NYHA Class II  CHA2DS2-VASc Score = 8  This indicates a 10.8% annual risk of stroke. The patient's score is based upon: CHF History: Yes HTN History: Yes Diabetes History: Yes Stroke History: Yes Vascular Disease History: Yes Age Score: 2 Gender Score: 0       For questions or updates, please contact [facility] Please consult [email or web address] for contact info under    Signed, Abigail Butts, PA-C  07/09/2020 4:25 PM

## 2020-07-09 NOTE — Plan of Care (Signed)
  Problem: Activity: Goal: Risk for activity intolerance will decrease Outcome: Progressing   Problem: Nutrition: Goal: Adequate nutrition will be maintained Outcome: Progressing   

## 2020-07-09 NOTE — Progress Notes (Signed)
No new issues or problems overnight.  Patient comfortable in bed currently.  He denies any back pain.  He is not having any lower extremity pain numbness or weakness.  On exam he is awake and alert.  He is oriented and appropriate.  Straight leg raising negative bilaterally.  Motor and sensory exam intact in both lower extremities.  Still has a little bit of mid lumbar tenderness.  Follow-up MRI scan reviewed.  This essentially unchanged.  The signal abnormality at the L1-2 disc space seems most consistent with severe degenerative change.  I see no evidence of ongoing osteomyelitis or discitis.  Continue to see no evidence of any acute or subacute findings which would explain his fairly recent decline.  Certainly the patient has severe multilevel degenerative disease.  He certainly has significant stenosis both at L2-3 and L4-5 and to a lesser degree at L1-2 and L3-4.  His lower extremity symptoms are very minimal compared to his back pain however.  I do not think that any type of simple decompressive surgery would be of benefit for him.  I certainly would not want to consider any type of multilevel reconstructive surgery to treat his severe multilevel spondylosis.  With this in mind I think the best option are efforts at pain control and mobilization with therapy.  I do not think that any injections are necessary at this point.  I will continue to follow.

## 2020-07-09 NOTE — Progress Notes (Addendum)
PROGRESS NOTE    [name]  ZOX:[id number] DOB: 1933/06/25 DOA: 07/07/2020 PCP: [name], MD   Chief Complaint  Patient presents with  . Back Pain  . Loss of Consciousness   Brief Narrative: [name] is [name] 87 y.o. male with medical history significant of afib on Coumadin; CVA; seizure d/o; CAD; hypothyroidism; HTN; HLD; and DM presenting with syncope.  They left [name] week or two ago for Daniel Huertas trip to the [place] with family.  He was doing well when he left home but that day he noticed difficulty walking.  He was having back pain.  He thinks he was weak in both legs.  They stayed [name] few days and he just laid around and  it got worse.  They came home and he was so "demobilized."  He was admitted from 4/10-12 "and that's where I've been ever since... I think I was in the hospital and came back home and it didn't get no better so they brought me back in."  He didn't get any better at home.    He was admitted for [name] 35-40 second spell, like passing out, while he was seated.    In review of his last admission, he presented to [facility] on 4/11 for B LE weakness and back pain.  He was transferred to [facility] for MRI.  CRP and ESR were negative.  HTN medications were adjusted (lowered) but to hypotension.  He was started on Flomax for his urinary symptoms.  MRI brain was without acute problems and there was mild cervical stenosis noted.  MRI of the L spine showed inflammatory signal in L1-2 thought to be acute on chronic disc/endplate degeneration with moderate to severe spinal stenosis.    Assessment & Plan:   Principal Problem:   Syncope Active Problems:   Dyslipidemia   HTN (hypertension)   Paroxysmal atrial fibrillation (HCC)   Hypothyroidism   Controlled type 2 diabetes mellitus with complication, without long-term current use of insulin (HCC)   History of seizures   Acute midline low back pain without sciatica  Syncope -Patient with prior h/o stroke as well as known seizure d/o who  presented with transient (<1 minute) episode of altered sensorium/unresponsiveness this AM - this occurred while he was seated -> tele notable for brady events here (?mobitz 2).  He's on metoprolol, will hold this.  Have consulted cardiology.  -EEG without seizure/eplileptiform activity -continue tele monitoring  -negative orthostatics -Troponins negative x 2 -Head CT negative -2d echo ordered -> EF 81-19%, grade 1 diastolic dysfunction (see report) -Neuro checks  -PT/OT eval and treat  Bradycardia  - tele with pauses -> dropped beats, concerning for mobitz 2/high grade AV block? - hold metoprolol  - also on clonidine, which could contribute, though metop more obvious contributor  - cardiology consulted in setting of concern for high grade AV block/syncope above, appreciate assistance  Back pain -Recent MRI with apparent severe lumbar spinal stenosis - repeat MRI with unchanged appearance of hyperintense T2 weighted signal within the endplates at J4-7, no contrast enhancement in the disc space.  Unchanged multilevel severe spondylosis with spinal canal stenosis worst at L2-3 and L4-5.  -Persistent weakness, difficulty ambulating, urinary incontinence -Neurosurgery consult requested, Dr. Annette Stable to see -> pain control and mobilization with therapy, follow -Continue Lidoderm, Norco for pain  Urinary incontinence -Daughter reports significant issue with incontinence which is not baseline -Recently started on Flomax without improvement -discussed question of NPH with neurosurgery - will follow at  this point  Afib -Rate controlled -Continue Coumadin, pharmacy to dose  Seizure d/o -EEG, as above -Continue Carbamazepine and Keppra  Hypothyroidism -Elevated TSH -Continue Synthroid at current dose for now but needs outpatient PCP f/u  HTN -Continue Clonidine, Imdur, Cozaar. Lopressor on hold  HLD -Continue Lipitor  DM -Recent A1c 6.3 indicates good control -hold  Glucophage -Cover with moderate-scale SSI  DVT prophylaxis: warfarin Code Status: full  Family Communication: son at bedside - called kim, no answer Disposition:   Status is: Inpatient  Remains inpatient appropriate because:Inpatient level of care appropriate due to severity of illness   Dispo: The patient is from: Home              Anticipated d/c is to: pending              Patient currently is not medically stable to d/c.   Difficult to place patient No   Consultants:   Neurosurgery  cardiology  Procedures: Echo IMPRESSIONS    1. Left ventricular ejection fraction, by estimation, is 50 to 55%. The  left ventricle has low normal function. The left ventricle has no regional  wall motion abnormalities. There is mild left ventricular hypertrophy.  Left ventricular diastolic  parameters are consistent with Grade I diastolic dysfunction (impaired  relaxation). Elevated left atrial pressure.  2. Right ventricular systolic function is normal. The right ventricular  size is normal. There is mildly elevated pulmonary artery systolic  pressure.  3. Left atrial size was severely dilated.  4. The mitral valve is normal in structure. Trivial mitral valve  regurgitation. No evidence of mitral stenosis. Moderate mitral annular  calcification.  5. The aortic valve is tricuspid. There is mild calcification of the  aortic valve. There is moderate thickening of the aortic valve. Aortic  valve regurgitation is not visualized. Mild to moderate aortic valve  sclerosis/calcification is present, without  any evidence of aortic stenosis.  6. There is mild dilatation of the ascending aorta, measuring 39 mm.   EEG IMPRESSION: This study is suggestive of mild diffuse encephalopathy, nonspecific etiology. No seizures or epileptiform discharges were seen throughout the recording.   Antimicrobials: Anti-infectives (From admission, onward)   None     Subjective: No new  complaints  Objective: Vitals:   07/09/20 0723 07/09/20 0836 07/09/20 1118 07/09/20 1601  BP: (!) 153/120 (!) 129/58 104/61 (!) 150/70  Pulse: 66 63 (!) 57 62  Resp: 18  18 18   Temp: 98.1 F (36.7 C)  97.8 F (36.6 C) 98 F (36.7 C)  TempSrc: Oral  Oral Oral  SpO2: 99% 99% 100% 95%  Weight:      Height:        Intake/Output Summary (Last 24 hours) at 07/09/2020 1610 Last data filed at 07/09/2020 1531 Gross per 24 hour  Intake 2533.1 ml  Output 625 ml  Net 1908.1 ml   Filed Weights   07/07/20 2237 07/09/20 0314  Weight: 78.5 kg 81.1 kg    Examination:  General exam: Appears calm and comfortable  Respiratory system: Clear to auscultation. Respiratory effort normal. Cardiovascular system: S1 & S2 heard, RRR. Gastrointestinal system: Abdomen is nondistended, soft and nontender. Central nervous system: Alert and oriented. No focal neurological deficits. Extremities: no LEE Skin: No rashes, lesions or ulcers Psychiatry: Judgement and insight appear normal. Mood & affect appropriate.     Data Reviewed: I have personally reviewed following labs and imaging studies  CBC: Recent Labs  Lab 07/07/20 2241 07/09/20 0442  WBC  7.8 8.2  NEUTROABS 4.7  --   HGB 10.7* 11.0*  HCT 33.4* 33.3*  MCV 96.8 95.7  PLT 300 220    Basic Metabolic Panel: Recent Labs  Lab 07/07/20 2241 07/09/20 0442  NA 135 135  K 4.0 4.4  CL 103 101  CO2 29 29  GLUCOSE 124* 109*  BUN 23 18  CREATININE 1.08 1.06  CALCIUM 7.9* 8.5*    GFR: Estimated Creatinine Clearance: 49.3 mL/min (by C-G formula based on SCr of 1.06 mg/dL).  Liver Function Tests: Recent Labs  Lab 07/07/20 2241  AST 16  ALT 15  ALKPHOS 62  BILITOT 0.4  PROT 5.2*  ALBUMIN 3.0*    CBG: Recent Labs  Lab 07/08/20 1759 07/08/20 2100 07/09/20 0602 07/09/20 1120 07/09/20 1602  GLUCAP 155* 171* 117* 126* 122*     Recent Results (from the past 240 hour(s))  Resp Panel by RT-PCR (Flu Shalawn Wynder&B, Covid)  Nasopharyngeal Swab     Status: None   Collection Time: 06/29/20  8:00 PM   Specimen: Nasopharyngeal Swab; Nasopharyngeal(NP) swabs in vial transport medium  Result Value Ref Range Status   SARS Coronavirus 2 by RT PCR NEGATIVE NEGATIVE Final    Comment: (NOTE) SARS-CoV-2 target nucleic acids are NOT DETECTED.  The SARS-CoV-2 RNA is generally detectable in upper respiratory specimens during the acute phase of infection. The lowest concentration of SARS-CoV-2 viral copies this assay can detect is 138 copies/mL. Marks Scalera negative result does not preclude SARS-Cov-2 infection and should not be used as the sole basis for treatment or other patient management decisions. Estill Llerena negative result may occur with  improper specimen collection/handling, submission of specimen other than nasopharyngeal swab, presence of viral mutation(s) within the areas targeted by this assay, and inadequate number of viral copies(<138 copies/mL). Mariena Meares negative result must be combined with clinical observations, patient history, and epidemiological information. The expected result is Negative.  Fact Sheet for Patients:  EntrepreneurPulse.com.au  Fact Sheet for Healthcare Providers:  IncredibleEmployment.be  This test is no t yet approved or cleared by the [place] FDA and  has been authorized for detection and/or diagnosis of SARS-CoV-2 by FDA under an Emergency Use Authorization (EUA). This EUA will remain  in effect (meaning this test can be used) for the duration of the COVID-19 declaration under Section 564(b)(1) of the Act, 21 U.S.C.section 360bbb-3(b)(1), unless the authorization is terminated  or revoked sooner.       Influenza Vanilla Heatherington by PCR NEGATIVE NEGATIVE Final   Influenza B by PCR NEGATIVE NEGATIVE Final    Comment: (NOTE) The Xpert Xpress SARS-CoV-2/FLU/RSV plus assay is intended as an aid in the diagnosis of influenza from Nasopharyngeal swab specimens and should not be  used as Elorah Dewing sole basis for treatment. Nasal washings and aspirates are unacceptable for Xpert Xpress SARS-CoV-2/FLU/RSV testing.  Fact Sheet for Patients: EntrepreneurPulse.com.au  Fact Sheet for Healthcare Providers: IncredibleEmployment.be  This test is not yet approved or cleared by the [place] FDA and has been authorized for detection and/or diagnosis of SARS-CoV-2 by FDA under an Emergency Use Authorization (EUA). This EUA will remain in effect (meaning this test can be used) for the duration of the COVID-19 declaration under Section 564(b)(1) of the Act, 21 U.S.C. section 360bbb-3(b)(1), unless the authorization is terminated or revoked.  Performed at [facility], [address] [place], [place] [address]   Culture, blood (single)     Status: None   Collection Time: 06/29/20  9:00 PM   Specimen: BLOOD LEFT  FOREARM  Result Value Ref Range Status   Specimen Description BLOOD LEFT FOREARM  Final   Special Requests   Final    BOTTLES DRAWN AEROBIC AND ANAEROBIC Blood Culture adequate volume   Culture   Final    NO GROWTH 5 DAYS Performed at [facility], [address] [place], [place] [address]    Report Status 07/04/2020 FINAL  Final  Culture, blood (routine x 2)     Status: None   Collection Time: 06/30/20  8:36 AM   Specimen: BLOOD RIGHT HAND  Result Value Ref Range Status   Specimen Description BLOOD RIGHT HAND  Final   Special Requests   Final    BOTTLES DRAWN AEROBIC AND ANAEROBIC Blood Culture adequate volume   Culture   Final    NO GROWTH 5 DAYS Performed at [facility], [place] [address], [place], [place] [address]    Report Status 07/05/2020 FINAL  Final  Culture, blood (routine x 2)     Status: None   Collection Time: 06/30/20  8:38 AM   Specimen: BLOOD RIGHT HAND  Result Value Ref Range Status   Specimen Description BLOOD RIGHT HAND  Final   Special Requests   Final    BOTTLES DRAWN AEROBIC AND ANAEROBIC  Blood Culture results may not be optimal due to an excessive volume of blood received in culture bottles   Culture   Final    NO GROWTH 5 DAYS Performed at [facility], [place] [address], [place], [place] [address]    Report Status 07/05/2020 FINAL  Final  Resp Panel by RT-PCR (Flu Altan Kraai&B, Covid) Urine, Catheterized     Status: None   Collection Time: 07/08/20  4:30 AM   Specimen: Urine, Catheterized; Nasopharyngeal(NP) swabs in vial transport medium  Result Value Ref Range Status   SARS Coronavirus 2 by RT PCR NEGATIVE NEGATIVE Final    Comment: (NOTE) SARS-CoV-2 target nucleic acids are NOT DETECTED.  The SARS-CoV-2 RNA is generally detectable in upper respiratory specimens during the acute phase of infection. The lowest concentration of SARS-CoV-2 viral copies this assay can detect is 138 copies/mL. Winna Golla negative result does not preclude SARS-Cov-2 infection and should not be used as the sole basis for treatment or other patient management decisions. Pollyann Roa negative result may occur with  improper specimen collection/handling, submission of specimen other than nasopharyngeal swab, presence of viral mutation(s) within the areas targeted by this assay, and inadequate number of viral copies(<138 copies/mL). Telitha Plath negative result must be combined with clinical observations, patient history, and epidemiological information. The expected result is Negative.  Fact Sheet for Patients:  EntrepreneurPulse.com.au  Fact Sheet for Healthcare Providers:  IncredibleEmployment.be  This test is no t yet approved or cleared by the [place] FDA and  has been authorized for detection and/or diagnosis of SARS-CoV-2 by FDA under an Emergency Use Authorization (EUA). This EUA will remain  in effect (meaning this test can be used) for the duration of the COVID-19 declaration under Section 564(b)(1) of the Act, 21 U.S.C.section 360bbb-3(b)(1), unless the authorization is  terminated  or revoked sooner.       Influenza Laiken Sandy by PCR NEGATIVE NEGATIVE Final   Influenza B by PCR NEGATIVE NEGATIVE Final    Comment: (NOTE) The Xpert Xpress SARS-CoV-2/FLU/RSV plus assay is intended as an aid in the diagnosis of influenza from Nasopharyngeal swab specimens and should not be used as Allyn Bartelson sole basis for treatment. Nasal washings and aspirates are unacceptable for Xpert  Xpress SARS-CoV-2/FLU/RSV testing.  Fact Sheet for Patients: EntrepreneurPulse.com.au  Fact Sheet for Healthcare Providers: IncredibleEmployment.be  This test is not yet approved or cleared by the [place] FDA and has been authorized for detection and/or diagnosis of SARS-CoV-2 by FDA under an Emergency Use Authorization (EUA). This EUA will remain in effect (meaning this test can be used) for the duration of the COVID-19 declaration under Section 564(b)(1) of the Act, 21 U.S.C. section 360bbb-3(b)(1), unless the authorization is terminated or revoked.  Performed at [facility], [place] [address], [place], [place] [address]          Radiology Studies: CT Head Wo Contrast  Result Date: 07/08/2020 CLINICAL DATA:  Syncope EXAM: CT HEAD WITHOUT CONTRAST TECHNIQUE: Contiguous axial images were obtained from the base of the skull through the vertex without intravenous contrast. COMPARISON:  06/29/2020 FINDINGS: Brain: Old left thalamic lacunar infarct. There is atrophy and chronic small vessel disease changes. No acute intracranial abnormality. Specifically, no hemorrhage, hydrocephalus, mass lesion, acute infarction, or significant intracranial injury. Vascular: No hyperdense vessel or unexpected calcification. Skull: No acute calvarial abnormality. Sinuses/Orbits: No acute findings Other: None IMPRESSION: Atrophy, chronic microvascular disease. No acute intracranial abnormality. Electronically Signed   By: Rolm Baptise M.D.   On: 07/08/2020 02:28   MR  Lumbar Spine W Wo Contrast  Result Date: 07/08/2020 CLINICAL DATA:  Severe low back pain EXAM: MRI LUMBAR SPINE WITHOUT AND WITH CONTRAST TECHNIQUE: Multiplanar and multiecho pulse sequences of the lumbar spine were obtained without and with intravenous contrast. CONTRAST:  7.11m GADAVIST GADOBUTROL 1 MMOL/ML IV SOLN COMPARISON:  06/30/2020 FINDINGS: Segmentation:  Standard. Alignment:  Physiologic. Vertebrae: Hyperintense T2-weighted signal within the endplates at LR1-5is unchanged. There is mild contrast enhancement. No enhancement in the disc space. Conus medullaris and cauda equina: Conus extends to the L1 level. Conus and cauda equina appear normal. Paraspinal and other soft tissues: Negative. Disc levels: Compared to the MRI the lumbar spine performed 06/30/2020, there is no change in the degree of multilevel severe spondylosis. Spinal canal stenosis remains worst at L2-3 and L4-5. IMPRESSION: 1. Unchanged appearance of hyperintense T2-weighted signal within the endplates at LQ0-0 No contrast enhancement in the disc space. Findings are most likely degenerative. 2. Unchanged multilevel severe spondylosis with spinal canal stenosis worst at L2-3 and L4-5. Electronically Signed   By: KUlyses JarredM.D.   On: 07/08/2020 19:07   DG Chest Portable 1 View  Result Date: 07/08/2020 CLINICAL DATA:  Cough EXAM: PORTABLE CHEST 1 VIEW COMPARISON:  06/29/2020 radiograph, 07/02/2011 CT FINDINGS: Low volumes and streaky opacities favoring atelectatic change. No consolidation, features of edema, pneumothorax, or effusion. Pulmonary vascularity is normally distributed. The cardiomediastinal contours are stable from priors with cardiomegaly and aortic atherosclerosis. No acute osseous or soft tissue abnormality. Degenerative changes are present in the imaged spine and shoulders. Telemetry leads overlie the chest. IMPRESSION: 1. Low volumes and streaky opacities favoring atelectatic change. 2. Stable cardiomegaly. 3.   Aortic Atherosclerosis (ICD10-I70.0). Electronically Signed   By: PLovena LeM.D.   On: 07/08/2020 02:07   EEG adult  Result Date: 07/09/2020 YLora Havens MD     07/09/2020  8:53 AM Patient Name: WMOUHAMED GLASSCOMRN: [id number] Attending: PLora HavensReferring Physician/Provider: Dr. JKarmen BongoDate: 07/08/2020 Duration: 24.15 mins Patient history: 87year old male with prior history of stroke as well as seizure presented with transient episode of alteration of awareness.  EEG to evaluate for seizures. Level of alertness:  Awake AEDs during EEG study: Carbamazepine, Keppra Technical aspects: This EEG study was done with scalp electrodes positioned according to the 10-20 International system of electrode placement. Electrical activity was acquired at Tehani Mersman sampling rate of 500Hz  and reviewed with Levonne Carreras high frequency filter of 70Hz  and Willeen Novak low frequency filter of 1Hz . EEG data were recorded continuously and digitally stored. Description: The posterior dominant rhythm consists of 8 Hz activity of moderate voltage (25-35 uV) seen predominantly in posterior head regions, symmetric and reactive to eye opening and eye closing. EEG showed intermittent generalized 3 to 6 Hz theta-delta slowing. Physiologic photic driving was not seen during photic stimulation.  Hyperventilation was not performed.   ABNORMALITY - Intermittent slow, generalized IMPRESSION: This study is suggestive of mild diffuse encephalopathy, nonspecific etiology. No seizures or epileptiform discharges were seen throughout the recording. Lora Havens   ECHOCARDIOGRAM COMPLETE  Result Date: 07/08/2020    ECHOCARDIOGRAM REPORT   Patient Name:   OLOF MARCIL Date of Exam: 07/08/2020 Medical Rec #:  [id number]        Height:       68.0 in Accession #:    [id number]       Weight:       173.0 lb Date of Birth:  [birth year]         BSA:          1.922 m Patient Age:    89 years         BP:           184/86 mmHg Patient Gender: M                 HR:           64 bpm. Exam Location:  Inpatient Procedure: 2D Echo, Cardiac Doppler, Color Doppler and Intracardiac            Opacification Agent Indications:    Syncope R55  History:        Patient has prior history of Echocardiogram examinations, most                 recent 05/30/2018. CAD, Stroke and Carotid Disease,                 Arrythmias:LBBB; Risk Factors:Hypertension, Diabetes and                 Dyslipidemia. Meniere's disease. History of Aflutter.  Sonographer:    Darlina Sicilian RDCS Referring Phys: [place]  1. Left ventricular ejection fraction, by estimation, is 50 to 55%. The left ventricle has low normal function. The left ventricle has no regional wall motion abnormalities. There is mild left ventricular hypertrophy. Left ventricular diastolic parameters are consistent with Grade I diastolic dysfunction (impaired relaxation). Elevated left atrial pressure.  2. Right ventricular systolic function is normal. The right ventricular size is normal. There is mildly elevated pulmonary artery systolic pressure.  3. Left atrial size was severely dilated.  4. The mitral valve is normal in structure. Trivial mitral valve regurgitation. No evidence of mitral stenosis. Moderate mitral annular calcification.  5. The aortic valve is tricuspid. There is mild calcification of the aortic valve. There is moderate thickening of the aortic valve. Aortic valve regurgitation is not visualized. Mild to moderate aortic valve sclerosis/calcification is present, without any evidence of aortic stenosis.  6. There is mild dilatation of the ascending aorta, measuring 39 mm. FINDINGS  Left Ventricle: Left ventricular ejection fraction, by estimation, is 50 to  55%. The left ventricle has low normal function. The left ventricle has no regional wall motion abnormalities. Definity contrast agent was given IV to delineate the left ventricular endocardial borders. The left ventricular internal cavity size  was normal in size. There is mild left ventricular hypertrophy. Left ventricular diastolic parameters are consistent with Grade I diastolic dysfunction (impaired relaxation). Elevated left atrial pressure. Right Ventricle: The right ventricular size is normal. No increase in right ventricular wall thickness. Right ventricular systolic function is normal. There is mildly elevated pulmonary artery systolic pressure. The tricuspid regurgitant velocity is 2.51  m/s, and with an assumed right atrial pressure of 8 mmHg, the estimated right ventricular systolic pressure is 23.5 mmHg. Left Atrium: Left atrial size was severely dilated. Right Atrium: Right atrial size was normal in size. Pericardium: There is no evidence of pericardial effusion. Mitral Valve: The mitral valve is normal in structure. Moderate mitral annular calcification. Trivial mitral valve regurgitation. No evidence of mitral valve stenosis. Tricuspid Valve: The tricuspid valve is normal in structure. Tricuspid valve regurgitation is mild. Aortic Valve: The aortic valve is tricuspid. There is mild calcification of the aortic valve. There is moderate thickening of the aortic valve. Aortic valve regurgitation is not visualized. Mild to moderate aortic valve sclerosis/calcification is present, without any evidence of aortic stenosis. Aortic valve mean gradient measures 7.2 mmHg. Aortic valve peak gradient measures 12.5 mmHg. Aortic valve area, by VTI measures 1.80 cm. Pulmonic Valve: The pulmonic valve was not well visualized. Pulmonic valve regurgitation is not visualized. Aorta: The aortic root is normal in size and structure. There is mild dilatation of the ascending aorta, measuring 39 mm. IAS/Shunts: No atrial level shunt detected by color flow Doppler.  LEFT VENTRICLE PLAX 2D LVIDd:         4.90 cm      Diastology LVIDs:         3.90 cm      LV e' medial:    3.92 cm/s LV PW:         1.20 cm      LV E/e' medial:  18.6 LV IVS:        1.30 cm      LV e'  lateral:   5.33 cm/s LVOT diam:     2.20 cm      LV E/e' lateral: 13.7 LV SV:         76 LV SV Index:   39 LVOT Area:     3.80 cm  LV Volumes (MOD) LV vol d, MOD A2C: 125.0 ml LV vol d, MOD A4C: 129.0 ml LV vol s, MOD A2C: 65.1 ml LV vol s, MOD A4C: 65.6 ml LV SV MOD A2C:     59.9 ml LV SV MOD A4C:     129.0 ml LV SV MOD BP:      67.5 ml RIGHT VENTRICLE RV S prime:     12.30 cm/s TAPSE (M-mode): 2.4 cm LEFT ATRIUM             Index       RIGHT ATRIUM           Index LA diam:        4.00 cm 2.08 cm/m  RA Area:     16.20 cm LA Vol (A2C):   86.3 ml 44.91 ml/m RA Volume:   38.50 ml  20.04 ml/m LA Vol (A4C):   97.8 ml 50.90 ml/m LA Biplane Vol: 92.8 ml 48.29 ml/m  AORTIC VALVE AV Area (  Vmax):    1.68 cm AV Area (Vmean):   1.67 cm AV Area (VTI):     1.80 cm AV Vmax:           176.80 cm/s AV Vmean:          125.200 cm/s AV VTI:            0.421 m AV Peak Grad:      12.5 mmHg AV Mean Grad:      7.2 mmHg LVOT Vmax:         78.30 cm/s LVOT Vmean:        54.900 cm/s LVOT VTI:          0.199 m LVOT/AV VTI ratio: 0.47  AORTA Ao Root diam: 3.60 cm Ao Asc diam:  3.80 cm MITRAL VALVE               TRICUSPID VALVE MV Area (PHT): 2.45 cm    TR Peak grad:   25.2 mmHg MV Decel Time: 310 msec    TR Vmax:        251.00 cm/s MV E velocity: 72.80 cm/s MV Andre Gallego velocity: 87.00 cm/s  SHUNTS MV E/Agnes Probert ratio:  0.84        Systemic VTI:  0.20 m                            Systemic Diam: 2.20 cm Mihai Croitoru MD Electronically signed by Sanda Klein MD Signature Date/Time: 07/08/2020/2:10:02 PM    Final         Scheduled Meds: . atorvastatin  80 mg Oral QPM  . carbamazepine  300 mg Oral BID  . cloNIDine  0.2 mg Oral BID  . escitalopram  5 mg Oral QHS  . insulin aspart  0-15 Units Subcutaneous TID WC  . insulin aspart  0-5 Units Subcutaneous QHS  . isosorbide mononitrate  60 mg Oral Daily  . levETIRAcetam  250 mg Oral BID  . levothyroxine  75 mcg Oral QAC breakfast  . lidocaine  1 patch Transdermal Q24H  . losartan  50 mg  Oral Daily  . sodium chloride flush  3 mL Intravenous Q12H  . tamsulosin  0.4 mg Oral QPC supper  . warfarin  7.5 mg Oral Q M,W,F,Su-1800   And  . [START ON 07/10/2020] warfarin  11.25 mg Oral Q T,Th,Sat-1800  . Warfarin - Pharmacist Dosing Inpatient   Does not apply q1600   Continuous Infusions: . lactated ringers 75 mL/hr at 07/09/20 0613     LOS: 1 day    Time spent: over 30 min    Fayrene Helper, MD [facility]   To contact the attending provider between 7A-7P or the covering provider during after hours 7P-7A, please log into the web site [email or web address] and access using universal  password for that web site. If you do not have the password, please call the hospital operator.  07/09/2020, 4:10 PM

## 2020-07-09 NOTE — Progress Notes (Signed)
Nutrition Brief Note  RD consulted for assessment of nutritional needs/ status.   Wt Readings from Last 15 Encounters:  07/09/20 81.1 kg  06/29/20 78.5 kg  01/17/20 86.6 kg  01/17/20 79.9 kg  10/02/19 81.2 kg  09/18/19 81.2 kg  09/18/19 81.4 kg  08/21/19 80.7 kg  07/24/19 81.6 kg  05/17/19 82.6 kg  03/20/19 84.8 kg  12/26/18 87.5 kg  12/12/18 87.5 kg  11/07/18 88.9 kg  10/17/18 92.1 kg   [name] Pain is a 87 y.o. male with medical history significant of afib on Coumadin; CVA; seizure d/o; CAD; hypothyroidism; HTN; HLD; and DM presenting with syncope.  Pt admitted with syncope.   Reviewed I/O's: -320 ml x 24 hours   UOP: 800 ml x 24 hours  Per neurosurgery notes, pt with significant multilevel degeneration with assosciated spondylosis and significant lumbar stenosis at multiple levels (promiately L2-3 and L4-5).  Spoke with pt at bedside, who was eating lunch at time of visit. He reports "don't worry- nutrition is not my problem". Both pt and son confirm that pt with good appetite. He generally consumed 2 meals per day (Breakfast: eggs, grits, and toast; Lunch: chicken, pinto beans, and vegetable).   Per pt, he has lost about 50 pounds intentionally over the past year secondary to consuming healthier foods and eliminating eating out. Reviewed wt hx; wt has been stable over the past 6 months.   Nutrition-Focused physical exam completed. Findings are no fat depletion, no muscle depletion, and no edema.   Medications reviewed and include keppra and lactated ringers @ 75 ml/hr.   Lab Results  Component Value Date   HGBA1C 6.3 (H) 06/30/2020   PTA DM medications are 500 mg metformin BID.   Labs reviewed: CBGS: 117-171 (inpatient orders for glycemic control are 0-15 units insulin aspart TID with meals).   Current diet order is heart healthy/ carb modified, patient is consuming approximately 85-100% of meals at this time. Labs and medications reviewed.   No nutrition  interventions warranted at this time. If nutrition issues arise, please consult RD.   Loistine Chance, RD, LDN, [place] Registered Dietitian II Certified Diabetes Care and Education Specialist Please refer to [facility] for RD and/or RD on-call/weekend/after hours pager

## 2020-07-10 ENCOUNTER — Telehealth: Payer: Self-pay | Admitting: Cardiology

## 2020-07-10 LAB — COMPREHENSIVE METABOLIC PANEL
ALT: 15 U/L (ref 0–44)
AST: 16 U/L (ref 15–41)
Albumin: 3 g/dL — ABNORMAL LOW (ref 3.5–5.0)
Alkaline Phosphatase: 60 U/L (ref 38–126)
Anion gap: 5 (ref 5–15)
BUN: 15 mg/dL (ref 8–23)
CO2: 29 mmol/L (ref 22–32)
Calcium: 8.8 mg/dL — ABNORMAL LOW (ref 8.9–10.3)
Chloride: 103 mmol/L (ref 98–111)
Creatinine, Ser: 0.94 mg/dL (ref 0.61–1.24)
GFR, Estimated: >60 mL/min (ref >60)
Glucose, Bld: 103 mg/dL — ABNORMAL HIGH (ref 70–99)
Potassium: 4 mmol/L (ref 3.5–5.1)
Sodium: 137 mmol/L (ref 135–145)
Total Bilirubin: 0.4 mg/dL (ref 0.3–1.2)
Total Protein: 5.2 g/dL — ABNORMAL LOW (ref 6.5–8.1)

## 2020-07-10 LAB — CBC WITH DIFFERENTIAL/PLATELET
Abs Immature Granulocytes: 0.05 10*3/uL (ref 0.00–0.07)
Basophils Absolute: 0.1 10*3/uL (ref 0.0–0.1)
Basophils Relative: 1 %
Eosinophils Absolute: 0.7 10*3/uL — ABNORMAL HIGH (ref 0.0–0.5)
Eosinophils Relative: 10 %
HCT: 34.2 % — ABNORMAL LOW (ref 39.0–52.0)
Hemoglobin: 11.4 g/dL — ABNORMAL LOW (ref 13.0–17.0)
Immature Granulocytes: 1 %
Lymphocytes Relative: 28 %
Lymphs Abs: 1.9 10*3/uL (ref 0.7–4.0)
MCH: 31.6 pg (ref 26.0–34.0)
MCHC: 33.3 g/dL (ref 30.0–36.0)
MCV: 94.7 fL (ref 80.0–100.0)
Monocytes Absolute: 0.7 10*3/uL (ref 0.1–1.0)
Monocytes Relative: 11 %
Neutro Abs: 3.5 10*3/uL (ref 1.7–7.7)
Neutrophils Relative %: 49 %
Platelets: 303 10*3/uL (ref 150–400)
RBC: 3.61 MIL/uL — ABNORMAL LOW (ref 4.22–5.81)
RDW: 14.8 % (ref 11.5–15.5)
WBC: 6.8 10*3/uL (ref 4.0–10.5)
nRBC: 0 % (ref 0.0–0.2)

## 2020-07-10 LAB — PROTIME-INR
INR: 2.3 — ABNORMAL HIGH (ref 0.8–1.2)
Prothrombin Time: 25.3 seconds — ABNORMAL HIGH (ref 11.4–15.2)

## 2020-07-10 LAB — MAGNESIUM: Magnesium: 2 mg/dL (ref 1.7–2.4)

## 2020-07-10 LAB — GLUCOSE, CAPILLARY
Glucose-Capillary: 105 mg/dL — ABNORMAL HIGH (ref 70–99)
Glucose-Capillary: 151 mg/dL — ABNORMAL HIGH (ref 70–99)
Glucose-Capillary: 131 mg/dL — ABNORMAL HIGH (ref 70–99)
Glucose-Capillary: 178 mg/dL — ABNORMAL HIGH (ref 70–99)

## 2020-07-10 LAB — PHOSPHORUS: Phosphorus: 3.2 mg/dL (ref 2.5–4.6)

## 2020-07-10 MED ORDER — HYDRALAZINE HCL 20 MG/ML IJ SOLN
10.0000 mg | Freq: Four times a day (QID) | INTRAMUSCULAR | Status: DC | PRN
  Administered 2020-07-10: 10 mg via INTRAVENOUS
  Filled 2020-07-10: qty 1

## 2020-07-10 NOTE — Progress Notes (Addendum)
Progress Note  Patient Name: [name] Date of Encounter: 07/10/2020  Primary Cardiologist: Minus Breeding, MD  Subjective   Feeling well from cardiac standpoint without any dizziness, chest pain, SOB, headache. BP high but no symptoms from this. Back pain has improved, feels a little better today.  Inpatient Medications    Scheduled Meds: . atorvastatin  80 mg Oral QPM  . carbamazepine  300 mg Oral BID  . cloNIDine  0.2 mg Oral BID  . escitalopram  5 mg Oral QHS  . insulin aspart  0-15 Units Subcutaneous TID WC  . insulin aspart  0-5 Units Subcutaneous QHS  . isosorbide mononitrate  60 mg Oral Daily  . levETIRAcetam  250 mg Oral BID  . levothyroxine  75 mcg Oral QAC breakfast  . lidocaine  1 patch Transdermal Q24H  . losartan  50 mg Oral Daily  . sodium chloride flush  3 mL Intravenous Q12H  . tamsulosin  0.4 mg Oral QPC supper  . warfarin  7.5 mg Oral Q M,W,F,Su-1800   And  . warfarin  11.25 mg Oral Q T,Th,Sat-1800  . Warfarin - Pharmacist Dosing Inpatient   Does not apply q1600   Continuous Infusions: . lactated ringers 75 mL/hr at 07/10/20 0851   PRN Meds: acetaminophen **OR** acetaminophen, albuterol, hydrALAZINE, HYDROcodone-acetaminophen, ondansetron **OR** ondansetron (ZOFRAN) IV   Vital Signs    Vitals:   07/10/20 0505 07/10/20 0509 07/10/20 0556 07/10/20 0846  BP:  (!) 190/89 (!) 185/78 (!) 186/90  Pulse:  62    Resp:  16    Temp:  (!) 97.5 F (36.4 C)    TempSrc:  Oral    SpO2:  97%    Weight: 79.1 kg     Height:        Intake/Output Summary (Last 24 hours) at 07/10/2020 0907 Last data filed at 07/10/2020 0612 Gross per 24 hour  Intake 2575.78 ml  Output 3900 ml  Net -1324.22 ml   Last 3 Weights 07/10/2020 07/09/2020 07/07/2020  Weight (lbs) 174 lb 6.1 oz 178 lb 12.7 oz 173 lb  Weight (kg) 79.1 kg 81.1 kg 78.472 kg     Telemetry    NSR, rare PVCs yesterday, last episode of bradycardia 1806 - Personally Reviewed  Physical Exam    GEN: No acute distress.  HEENT: Normocephalic, atraumatic, sclera non-icteric. Neck: No JVD or bruits. Cardiac: RRR no murmurs, rubs, or gallops.  Respiratory: Clear to auscultation bilaterally. Breathing is unlabored. GI: Soft, nontender, non-distended, BS +x 4. MS: no deformity. Extremities: No clubbing or cyanosis. No edema. Distal pedal pulses are 2+ and equal bilaterally. Neuro:  AAOx3. Follows commands. Psych:  Responds to questions appropriately with a normal affect.  Labs    High Sensitivity Troponin:   Recent Labs  Lab 06/29/20 2100 06/29/20 2304 07/08/20 0146 07/08/20 0346  TROPONINIHS 37* 34* 14 15      Cardiac EnzymesNo results for input(s): TROPONINI in the last 168 hours. No results for input(s): TROPIPOC in the last 168 hours.   Chemistry Recent Labs  Lab 07/07/20 2241 07/09/20 0442 07/10/20 0443  NA 135 135 137  K 4.0 4.4 4.0  CL 103 101 103  CO2 29 29 29   GLUCOSE 124* 109* 103*  BUN 23 18 15   CREATININE 1.08 1.06 0.94  CALCIUM 7.9* 8.5* 8.8*  PROT 5.2*  --  5.2*  ALBUMIN 3.0*  --  3.0*  AST 16  --  16  ALT 15  --  15  ALKPHOS 62  --  60  BILITOT 0.4  --  0.4  GFRNONAA >60 >60 >60  ANIONGAP 3* 5 5     Hematology Recent Labs  Lab 07/07/20 2241 07/09/20 0442 07/10/20 0443  WBC 7.8 8.2 6.8  RBC 3.45* 3.48* 3.61*  HGB 10.7* 11.0* 11.4*  HCT 33.4* 33.3* 34.2*  MCV 96.8 95.7 94.7  MCH 31.0 31.6 31.6  MCHC 32.0 33.0 33.3  RDW 15.1 15.2 14.8  PLT 300 300 303    BNPNo results for input(s): BNP, PROBNP in the last 168 hours.   DDimer No results for input(s): DDIMER in the last 168 hours.   Radiology    MR Lumbar Spine W Wo Contrast  Result Date: 07/08/2020 CLINICAL DATA:  Severe low back pain EXAM: MRI LUMBAR SPINE WITHOUT AND WITH CONTRAST TECHNIQUE: Multiplanar and multiecho pulse sequences of the lumbar spine were obtained without and with intravenous contrast. CONTRAST:  7.45mL GADAVIST GADOBUTROL 1 MMOL/ML IV SOLN COMPARISON:   06/30/2020 FINDINGS: Segmentation:  Standard. Alignment:  Physiologic. Vertebrae: Hyperintense T2-weighted signal within the endplates at G9-5 is unchanged. There is mild contrast enhancement. No enhancement in the disc space. Conus medullaris and cauda equina: Conus extends to the L1 level. Conus and cauda equina appear normal. Paraspinal and other soft tissues: Negative. Disc levels: Compared to the MRI the lumbar spine performed 06/30/2020, there is no change in the degree of multilevel severe spondylosis. Spinal canal stenosis remains worst at L2-3 and L4-5. IMPRESSION: 1. Unchanged appearance of hyperintense T2-weighted signal within the endplates at A2-1. No contrast enhancement in the disc space. Findings are most likely degenerative. 2. Unchanged multilevel severe spondylosis with spinal canal stenosis worst at L2-3 and L4-5. Electronically Signed   By: Ulyses Jarred M.D.   On: 07/08/2020 19:07   EEG adult  Result Date: 07/09/2020 Lora Havens, MD     07/09/2020  8:53 AM Patient Name: [name] MRN: [id number] Epilepsy Attending: Lora Havens Referring Physician/Provider: Dr. Karmen Bongo Date: 07/08/2020 Duration: 24.15 mins Patient history: 87 year old male with prior history of stroke as well as seizure presented with transient episode of alteration of awareness.  EEG to evaluate for seizures. Level of alertness: Awake AEDs during EEG study: Carbamazepine, Keppra Technical aspects: This EEG study was done with scalp electrodes positioned according to the 10-20 International system of electrode placement. Electrical activity was acquired at a sampling rate of 500Hz  and reviewed with a high frequency filter of 70Hz  and a low frequency filter of 1Hz . EEG data were recorded continuously and digitally stored. Description: The posterior dominant rhythm consists of 8 Hz activity of moderate voltage (25-35 uV) seen predominantly in posterior head regions, symmetric and reactive to eye opening  and eye closing. EEG showed intermittent generalized 3 to 6 Hz theta-delta slowing. Physiologic photic driving was not seen during photic stimulation.  Hyperventilation was not performed.   ABNORMALITY - Intermittent slow, generalized IMPRESSION: This study is suggestive of mild diffuse encephalopathy, nonspecific etiology. No seizures or epileptiform discharges were seen throughout the recording. Lora Havens   ECHOCARDIOGRAM COMPLETE  Result Date: 07/08/2020    ECHOCARDIOGRAM REPORT   Patient Name:   LAYLA GRAMM Date of Exam: 07/08/2020 Medical Rec #:  [id number]        Height:       68.0 in Accession #:    [id number]       Weight:       173.0 lb Date of Birth:  09/07/1933  BSA:          1.922 m Patient Age:    79 years         BP:           184/86 mmHg Patient Gender: M                HR:           64 bpm. Exam Location:  Inpatient Procedure: 2D Echo, Cardiac Doppler, Color Doppler and Intracardiac            Opacification Agent Indications:    Syncope R55  History:        Patient has prior history of Echocardiogram examinations, most                 recent 05/30/2018. CAD, Stroke and Carotid Disease,                 Arrythmias:LBBB; Risk Factors:Hypertension, Diabetes and                 Dyslipidemia. Meniere's disease. History of Aflutter.  Sonographer:    Darlina Sicilian RDCS Referring Phys: [place]  1. Left ventricular ejection fraction, by estimation, is 50 to 55%. The left ventricle has low normal function. The left ventricle has no regional wall motion abnormalities. There is mild left ventricular hypertrophy. Left ventricular diastolic parameters are consistent with Grade I diastolic dysfunction (impaired relaxation). Elevated left atrial pressure.  2. Right ventricular systolic function is normal. The right ventricular size is normal. There is mildly elevated pulmonary artery systolic pressure.  3. Left atrial size was severely dilated.  4. The mitral valve is  normal in structure. Trivial mitral valve regurgitation. No evidence of mitral stenosis. Moderate mitral annular calcification.  5. The aortic valve is tricuspid. There is mild calcification of the aortic valve. There is moderate thickening of the aortic valve. Aortic valve regurgitation is not visualized. Mild to moderate aortic valve sclerosis/calcification is present, without any evidence of aortic stenosis.  6. There is mild dilatation of the ascending aorta, measuring 39 mm. FINDINGS  Left Ventricle: Left ventricular ejection fraction, by estimation, is 50 to 55%. The left ventricle has low normal function. The left ventricle has no regional wall motion abnormalities. Definity contrast agent was given IV to delineate the left ventricular endocardial borders. The left ventricular internal cavity size was normal in size. There is mild left ventricular hypertrophy. Left ventricular diastolic parameters are consistent with Grade I diastolic dysfunction (impaired relaxation). Elevated left atrial pressure. Right Ventricle: The right ventricular size is normal. No increase in right ventricular wall thickness. Right ventricular systolic function is normal. There is mildly elevated pulmonary artery systolic pressure. The tricuspid regurgitant velocity is 2.51  m/s, and with an assumed right atrial pressure of 8 mmHg, the estimated right ventricular systolic pressure is 38.2 mmHg. Left Atrium: Left atrial size was severely dilated. Right Atrium: Right atrial size was normal in size. Pericardium: There is no evidence of pericardial effusion. Mitral Valve: The mitral valve is normal in structure. Moderate mitral annular calcification. Trivial mitral valve regurgitation. No evidence of mitral valve stenosis. Tricuspid Valve: The tricuspid valve is normal in structure. Tricuspid valve regurgitation is mild. Aortic Valve: The aortic valve is tricuspid. There is mild calcification of the aortic valve. There is moderate  thickening of the aortic valve. Aortic valve regurgitation is not visualized. Mild to moderate aortic valve sclerosis/calcification is present, without any evidence of aortic stenosis. Aortic valve  mean gradient measures 7.2 mmHg. Aortic valve peak gradient measures 12.5 mmHg. Aortic valve area, by VTI measures 1.80 cm. Pulmonic Valve: The pulmonic valve was not well visualized. Pulmonic valve regurgitation is not visualized. Aorta: The aortic root is normal in size and structure. There is mild dilatation of the ascending aorta, measuring 39 mm. IAS/Shunts: No atrial level shunt detected by color flow Doppler.  LEFT VENTRICLE PLAX 2D LVIDd:         4.90 cm      Diastology LVIDs:         3.90 cm      LV e' medial:    3.92 cm/s LV PW:         1.20 cm      LV E/e' medial:  18.6 LV IVS:        1.30 cm      LV e' lateral:   5.33 cm/s LVOT diam:     2.20 cm      LV E/e' lateral: 13.7 LV SV:         76 LV SV Index:   39 LVOT Area:     3.80 cm  LV Volumes (MOD) LV vol d, MOD A2C: 125.0 ml LV vol d, MOD A4C: 129.0 ml LV vol s, MOD A2C: 65.1 ml LV vol s, MOD A4C: 65.6 ml LV SV MOD A2C:     59.9 ml LV SV MOD A4C:     129.0 ml LV SV MOD BP:      67.5 ml RIGHT VENTRICLE RV S prime:     12.30 cm/s TAPSE (M-mode): 2.4 cm LEFT ATRIUM             Index       RIGHT ATRIUM           Index LA diam:        4.00 cm 2.08 cm/m  RA Area:     16.20 cm LA Vol (A2C):   86.3 ml 44.91 ml/m RA Volume:   38.50 ml  20.04 ml/m LA Vol (A4C):   97.8 ml 50.90 ml/m LA Biplane Vol: 92.8 ml 48.29 ml/m  AORTIC VALVE AV Area (Vmax):    1.68 cm AV Area (Vmean):   1.67 cm AV Area (VTI):     1.80 cm AV Vmax:           176.80 cm/s AV Vmean:          125.200 cm/s AV VTI:            0.421 m AV Peak Grad:      12.5 mmHg AV Mean Grad:      7.2 mmHg LVOT Vmax:         78.30 cm/s LVOT Vmean:        54.900 cm/s LVOT VTI:          0.199 m LVOT/AV VTI ratio: 0.47  AORTA Ao Root diam: 3.60 cm Ao Asc diam:  3.80 cm MITRAL VALVE               TRICUSPID VALVE MV  Area (PHT): 2.45 cm    TR Peak grad:   25.2 mmHg MV Decel Time: 310 msec    TR Vmax:        251.00 cm/s MV E velocity: 72.80 cm/s MV A velocity: 87.00 cm/s  SHUNTS MV E/A ratio:  0.84        Systemic VTI:  0.20 m  Systemic Diam: 2.20 cm Sanda Klein MD Electronically signed by Sanda Klein MD Signature Date/Time: 07/08/2020/2:10:02 PM    Final     Cardiac Studies   Left heart catheterization 2014: Final Conclusions: Patent RCA stent. There was 40-50% proximal LAD stenosis at D1 with 70-80% ostial D1 stenosis. D1 disease looks like it could potentially cause exertional chest pain, but would be surprised if this could have caused the 2 hours of chest pain at rest he had yesterday. I reviewed with Dr. [place]: intervention would be difficult given the disease in the LAD at D1 take-off, could potentially perform balloon angioplasty. Will plan on medical management. Will add Imdur to regimen. If he has further chest pain, could re-visit intervention on D1.  2D echo 07/08/20  1. Left ventricular ejection fraction, by estimation, is 50 to 55%. The  left ventricle has low normal function. The left ventricle has no regional  wall motion abnormalities. There is mild left ventricular hypertrophy.  Left ventricular diastolic  parameters are consistent with Grade I diastolic dysfunction (impaired  relaxation). Elevated left atrial pressure.  2. Right ventricular systolic function is normal. The right ventricular  size is normal. There is mildly elevated pulmonary artery systolic  pressure.  3. Left atrial size was severely dilated.  4. The mitral valve is normal in structure. Trivial mitral valve  regurgitation. No evidence of mitral stenosis. Moderate mitral annular  calcification.  5. The aortic valve is tricuspid. There is mild calcification of the  aortic valve. There is moderate thickening of the aortic valve. Aortic  valve regurgitation is not visualized.  Mild to moderate aortic valve  sclerosis/calcification is present, without  any evidence of aortic stenosis.  6. There is mild dilatation of the ascending aorta, measuring 39 mm.   Patient Profile     87 y.o. male CAD s/p PCI/DES to RCA in 8250, chronic diastolic CHF, paroxysmal atrial fibrillation/flutter, carotid artery disease, HTN, HLD, DM type 2, CVA, seizure disorder, and hypothyroidism. Beta blocker previously discontinued for bradycardia/pauses then resumed in subsequent phone notes 03/2020 for unclear reasons. Recently admitted 4/10-4/12 with generalized weakness and back pain. MRI imaging of his L spine which showed inflammation in the L1-2 disc and medial left psoas without abscess. Inflammatory markers were wnl and remainder of work up was benign. On the evening of 07/07/20 he had an episode of unresponsiveness while eating dinner. Telemetry initially was benign then he began having high grade AVB and possible transient CHB therefore metoprolol discontinued. Cardiology following.   Also being followed by IM/neurosurgery for back pain with persistent weakness, difficulty ambulating, urinary incontinence.  Assessment & Plan    1. Syncope with high grade AV block on telemetry - evidence of high grade AVB noted on telemetry this admission which could have been the cause - beta blocker discontinued. Last dose 0838, no further episodes of bradycardia after 1806 thus far - CT head without acute abnormality - 2D echo EF 50-55%, mild LVH, grade 1 DD, mildly elevated PASP, severe LAE, mild dilation of ascending aorta - also has h/o seizure disorder - EEG reported to have been without seizure/epileptiform activity - remains cautiously on clonidine at this time - will discuss plan with MD - consider at least another 24 hours of monitoring on telemetry since still had beta blocker in system from yesterday- - no evidence for PPM at present time but will need close OP f/u as well  2. CAD s/p PCI  2004 - stable, no angina, negative troponins -  BB on hold - continue statin, Imdur - not on ASA PTA due to concomitant warfarin  3. H/o paroxysmal atrial fib/flutter - Hgb appears stable compared to prior - on warfarin PTA, continue - NSR on telemetry - holding BB as above  4. Chronic diastolic CHF - appears euvolemic - on low dose Lasix as OP, can consider resuming at DC  5. Essential HTN - current rx: clonidine 0.2mg  BID, Imdur 60mg  daily, losartan 50mg  daily, tamsulosin 0.4mg  qsupper - remains hypertensive - consider titration of losartan to 100mg  daily  - avoid BB  6. Mild dilation of ascending aorta (27mm) - f/u as OP  Remainder of medical problems including abnormal TSH/back problems per primary team.  For questions or updates, please contact [place] Please consult [email or web address] for contact info under Cardiology/STEMI.  Signed, Charlie Pitter, PA-C 07/10/2020, 9:07 AM    History and all data above reviewed.  Patient examined.  I agree with the findings as above.   No pain.  No dizziness or presyncope. Weak legs.  The patient exam reveals COR:RRR  ,  Lungs: Clear  ,  Abd: Positive bowel sounds, no rebound no guarding, Ext no edema  .  All available labs, radiology testing, previous records reviewed. Agree with documented assessment and plan. Questionable syncope:  He is very unclear of the details of this event.  However, sounds like he had syncope and he has known bradycardia and brief pauses that seemed to improve off of beta blocker.  This was restarted and now has been discontinued again.  I agree with following him overnight on the current meds and making sure he has no further spells as he is mobilized.  Continue other meds as on MAR.   Jeneen Rinks Linas Stepter  11:53 AM  07/10/2020

## 2020-07-10 NOTE — Telephone Encounter (Signed)
   Pt's daughter calling, she said pt is back to the hospital due to syncope episode, she said the doctor not addressing his heart issue and they are getting frustrated . She wanted to make sure Dr. Percival Spanish is aware that pt is in the hospital and if there's a way if he can check on him while his there.

## 2020-07-10 NOTE — Telephone Encounter (Signed)
I spoke with his daughter.

## 2020-07-10 NOTE — Telephone Encounter (Signed)
Returned call to daughter (ok per DPR)-advised Dr. Percival Spanish saw patient this AM.    Daughter is wondering if Dr. Percival Spanish is going to call them to update them.   No one is there with the patient.    Advised would send message to MD to request call if able to discuss. Also advised to discuss with nurse caring for patient today.

## 2020-07-10 NOTE — Progress Notes (Signed)
Providing Compassionate, Quality Care - Together   Subjective: Patient denies pain. He reports he feels weak in general. He denies numbness or tingling.  Objective: Vital signs in last 24 hours: Temp:  [97.5 F (36.4 C)-98 F (36.7 C)] 97.9 F (36.6 C) (04/21 1055) Pulse Rate:  [62-64] 64 (04/21 1055) Resp:  [16-18] 17 (04/21 1055) BP: (99-190)/(50-90) 99/50 (04/21 1055) SpO2:  [95 %-99 %] 98 % (04/21 1055) Weight:  [79.1 kg] 79.1 kg (04/21 0505)  Intake/Output from previous day: 04/20 0701 - 04/21 0700 In: 3175.8 [P.O.:1080; I.V.:1995.8] Out: 4225 [Urine:4225] Intake/Output this shift: Total I/O In: -  Out: 400 [Urine:400]  Alert and oriented Speech clear, fluent MAE, Strength and sensation intact, aside from right triceps 4/5   Lab Results: Recent Labs    07/09/20 0442 07/10/20 0443  WBC 8.2 6.8  HGB 11.0* 11.4*  HCT 33.3* 34.2*  PLT 300 303   BMET Recent Labs    07/09/20 0442 07/10/20 0443  NA 135 137  K 4.4 4.0  CL 101 103  CO2 29 29  GLUCOSE 109* 103*  BUN 18 15  CREATININE 1.06 0.94  CALCIUM 8.5* 8.8*    Studies/Results: MR Lumbar Spine W Wo Contrast  Result Date: 07/08/2020 CLINICAL DATA:  Severe low back pain EXAM: MRI LUMBAR SPINE WITHOUT AND WITH CONTRAST TECHNIQUE: Multiplanar and multiecho pulse sequences of the lumbar spine were obtained without and with intravenous contrast. CONTRAST:  7.57mL GADAVIST GADOBUTROL 1 MMOL/ML IV SOLN COMPARISON:  06/30/2020 FINDINGS: Segmentation:  Standard. Alignment:  Physiologic. Vertebrae: Hyperintense T2-weighted signal within the endplates at H0-8 is unchanged. There is mild contrast enhancement. No enhancement in the disc space. Conus medullaris and cauda equina: Conus extends to the L1 level. Conus and cauda equina appear normal. Paraspinal and other soft tissues: Negative. Disc levels: Compared to the MRI the lumbar spine performed 06/30/2020, there is no change in the degree of multilevel severe  spondylosis. Spinal canal stenosis remains worst at L2-3 and L4-5. IMPRESSION: 1. Unchanged appearance of hyperintense T2-weighted signal within the endplates at M5-7. No contrast enhancement in the disc space. Findings are most likely degenerative. 2. Unchanged multilevel severe spondylosis with spinal canal stenosis worst at L2-3 and L4-5. Electronically Signed   By: Ulyses Jarred M.D.   On: 07/08/2020 19:07   EEG adult  Result Date: 07/09/2020 Lora Havens, MD     07/09/2020  8:53 AM Patient Name: [name] MRN: [id number] Epilepsy Attending: Lora Havens Referring Physician/Provider: Dr. Karmen Bongo Date: 07/08/2020 Duration: 24.15 mins Patient history: 87 year old male with prior history of stroke as well as seizure presented with transient episode of alteration of awareness.  EEG to evaluate for seizures. Level of alertness: Awake AEDs during EEG study: Carbamazepine, Keppra Technical aspects: This EEG study was done with scalp electrodes positioned according to the 10-20 International system of electrode placement. Electrical activity was acquired at a sampling rate of 500Hz  and reviewed with a high frequency filter of 70Hz  and a low frequency filter of 1Hz . EEG data were recorded continuously and digitally stored. Description: The posterior dominant rhythm consists of 8 Hz activity of moderate voltage (25-35 uV) seen predominantly in posterior head regions, symmetric and reactive to eye opening and eye closing. EEG showed intermittent generalized 3 to 6 Hz theta-delta slowing. Physiologic photic driving was not seen during photic stimulation.  Hyperventilation was not performed.   ABNORMALITY - Intermittent slow, generalized IMPRESSION: This study is suggestive of mild diffuse encephalopathy, nonspecific  etiology. No seizures or epileptiform discharges were seen throughout the recording. Lora Havens   ECHOCARDIOGRAM COMPLETE  Result Date: 07/08/2020    ECHOCARDIOGRAM REPORT    Patient Name:   [name] Date of Exam: 07/08/2020 Medical Rec #:  [id number]        Height:       68.0 in Accession #:    [id number]       Weight:       173.0 lb Date of Birth:  [birth year]         BSA:          1.922 m Patient Age:    87 years         BP:           184/86 mmHg Patient Gender: M                HR:           64 bpm. Exam Location:  Inpatient Procedure: 2D Echo, Cardiac Doppler, Color Doppler and Intracardiac            Opacification Agent Indications:    Syncope R55  History:        Patient has prior history of Echocardiogram examinations, most                 recent 05/30/2018. CAD, Stroke and Carotid Disease,                 Arrythmias:LBBB; Risk Factors:Hypertension, Diabetes and                 Dyslipidemia. Meniere's disease. History of Aflutter.  Sonographer:    Darlina Sicilian RDCS Referring Phys: [place]  1. Left ventricular ejection fraction, by estimation, is 50 to 55%. The left ventricle has low normal function. The left ventricle has no regional wall motion abnormalities. There is mild left ventricular hypertrophy. Left ventricular diastolic parameters are consistent with Grade I diastolic dysfunction (impaired relaxation). Elevated left atrial pressure.  2. Right ventricular systolic function is normal. The right ventricular size is normal. There is mildly elevated pulmonary artery systolic pressure.  3. Left atrial size was severely dilated.  4. The mitral valve is normal in structure. Trivial mitral valve regurgitation. No evidence of mitral stenosis. Moderate mitral annular calcification.  5. The aortic valve is tricuspid. There is mild calcification of the aortic valve. There is moderate thickening of the aortic valve. Aortic valve regurgitation is not visualized. Mild to moderate aortic valve sclerosis/calcification is present, without any evidence of aortic stenosis.  6. There is mild dilatation of the ascending aorta, measuring 39 mm. FINDINGS  Left  Ventricle: Left ventricular ejection fraction, by estimation, is 50 to 55%. The left ventricle has low normal function. The left ventricle has no regional wall motion abnormalities. Definity contrast agent was given IV to delineate the left ventricular endocardial borders. The left ventricular internal cavity size was normal in size. There is mild left ventricular hypertrophy. Left ventricular diastolic parameters are consistent with Grade I diastolic dysfunction (impaired relaxation). Elevated left atrial pressure. Right Ventricle: The right ventricular size is normal. No increase in right ventricular wall thickness. Right ventricular systolic function is normal. There is mildly elevated pulmonary artery systolic pressure. The tricuspid regurgitant velocity is 2.51  m/s, and with an assumed right atrial pressure of 8 mmHg, the estimated right ventricular systolic pressure is 10.1 mmHg. Left Atrium: Left atrial size was severely dilated. Right Atrium: Right atrial  size was normal in size. Pericardium: There is no evidence of pericardial effusion. Mitral Valve: The mitral valve is normal in structure. Moderate mitral annular calcification. Trivial mitral valve regurgitation. No evidence of mitral valve stenosis. Tricuspid Valve: The tricuspid valve is normal in structure. Tricuspid valve regurgitation is mild. Aortic Valve: The aortic valve is tricuspid. There is mild calcification of the aortic valve. There is moderate thickening of the aortic valve. Aortic valve regurgitation is not visualized. Mild to moderate aortic valve sclerosis/calcification is present, without any evidence of aortic stenosis. Aortic valve mean gradient measures 7.2 mmHg. Aortic valve peak gradient measures 12.5 mmHg. Aortic valve area, by VTI measures 1.80 cm. Pulmonic Valve: The pulmonic valve was not well visualized. Pulmonic valve regurgitation is not visualized. Aorta: The aortic root is normal in size and structure. There is mild  dilatation of the ascending aorta, measuring 39 mm. IAS/Shunts: No atrial level shunt detected by color flow Doppler.  LEFT VENTRICLE PLAX 2D LVIDd:         4.90 cm      Diastology LVIDs:         3.90 cm      LV e' medial:    3.92 cm/s LV PW:         1.20 cm      LV E/e' medial:  18.6 LV IVS:        1.30 cm      LV e' lateral:   5.33 cm/s LVOT diam:     2.20 cm      LV E/e' lateral: 13.7 LV SV:         76 LV SV Index:   39 LVOT Area:     3.80 cm  LV Volumes (MOD) LV vol d, MOD A2C: 125.0 ml LV vol d, MOD A4C: 129.0 ml LV vol s, MOD A2C: 65.1 ml LV vol s, MOD A4C: 65.6 ml LV SV MOD A2C:     59.9 ml LV SV MOD A4C:     129.0 ml LV SV MOD BP:      67.5 ml RIGHT VENTRICLE RV S prime:     12.30 cm/s TAPSE (M-mode): 2.4 cm LEFT ATRIUM             Index       RIGHT ATRIUM           Index LA diam:        4.00 cm 2.08 cm/m  RA Area:     16.20 cm LA Vol (A2C):   86.3 ml 44.91 ml/m RA Volume:   38.50 ml  20.04 ml/m LA Vol (A4C):   97.8 ml 50.90 ml/m LA Biplane Vol: 92.8 ml 48.29 ml/m  AORTIC VALVE AV Area (Vmax):    1.68 cm AV Area (Vmean):   1.67 cm AV Area (VTI):     1.80 cm AV Vmax:           176.80 cm/s AV Vmean:          125.200 cm/s AV VTI:            0.421 m AV Peak Grad:      12.5 mmHg AV Mean Grad:      7.2 mmHg LVOT Vmax:         78.30 cm/s LVOT Vmean:        54.900 cm/s LVOT VTI:          0.199 m LVOT/AV VTI ratio: 0.47  AORTA Ao Root diam: 3.60 cm Ao  Asc diam:  3.80 cm MITRAL VALVE               TRICUSPID VALVE MV Area (PHT): 2.45 cm    TR Peak grad:   25.2 mmHg MV Decel Time: 310 msec    TR Vmax:        251.00 cm/s MV E velocity: 72.80 cm/s MV A velocity: 87.00 cm/s  SHUNTS MV E/A ratio:  0.84        Systemic VTI:  0.20 m                            Systemic Diam: 2.20 cm [name] Croitoru MD Electronically signed by Sanda Klein MD Signature Date/Time: 07/08/2020/2:10:02 PM    Final     Assessment/Plan: Patient with lumbar pain and weakness that have worsened over the last few weeks. MRI lumbar spine  showed multilevel spondylitic disease with stenosis. MRI brain showed that patient has some degree of communicating hydrocephalus. This does not need treatment.   LOS: 2 days    -No neurosurgical intervention recommended at this time -Mobilize patient   Viona Gilmore, DNP, AGNP-C Nurse Practitioner  [facility]. [address], [address], [place], [place] [address] P: [phone]    F: [phone]  07/10/2020, 11:21 AM

## 2020-07-10 NOTE — Progress Notes (Signed)
ANTICOAGULATION CONSULT NOTE - Follow Up Consult  Pharmacy Consult for Warfarin Indication: atrial fibrillation  Allergies  Allergen Reactions  . Penicillins     Unknown reaction    Patient Measurements: Height: 5\' 8"  (172.7 cm) Weight: 79.1 kg (174 lb 6.1 oz) IBW/kg (Calculated) : 68.4  Vital Signs: Temp: 97.9 F (36.6 C) (04/21 1055) Temp Source: Oral (04/21 0930) BP: 99/50 (04/21 1055) Pulse Rate: 64 (04/21 1055)  Labs: Recent Labs    07/07/20 2241 07/08/20 0146 07/08/20 0346 07/08/20 1027 07/09/20 0442 07/10/20 0443  HGB 10.7*  --   --   --  11.0* 11.4*  HCT 33.4*  --   --   --  33.3* 34.2*  PLT 300  --   --   --  300 303  LABPROT  --   --   --  23.9* 25.0* 25.3*  INR  --   --   --  2.1* 2.3* 2.3*  CREATININE 1.08  --   --   --  1.06 0.94  TROPONINIHS  --  14 15  --   --   --     Estimated Creatinine Clearance: 55.6 mL/min (by C-G formula based on SCr of 0.94 mg/dL).   Assessment:  61 yom on warfarin PTA for afib presenting with back, LOC. Pharmacy consulted to dose warfarin inpatient. No active bleed issues reported. Noted, patient on carbamazepine PTA (continued inpatient).   INR therapeutic (2.1) on admit and remains therapeutic (2.3 x 2 days) on home regimen.   PTA regimen: 7.5 mg MWF-Sunday and 11.25 mg TTS.   Evaluated by Neurosurgery, no surgery or injection planned.  Goal of Therapy:  INR 2-3 Monitor platelets by anticoagulation protocol: Yes   Plan:  Continue PTA warfarin regimen of 7.5 mg MWF-Sun and 11.25 mg TTS (due today). Daily PT/INR for now, but will decrease frequency if remains stable. Monitor for s/sx bleeding.  Arty Baumgartner, [place] 07/10/2020,11:16 AM

## 2020-07-10 NOTE — TOC Progression Note (Addendum)
Transition of Care [facility]) - Progression Note    Patient Details  Name: [name] MRN: [id number] Date of Birth: 27-Oct-1933  Transition of [facility] CM/SW Contact  Zenon Mayo, RN Phone Number: 07/10/2020, 5:00 PM  Clinical Narrative:    NCM spoke with patient, offered choice he has no preference, NCM made referral to [facility] with [facility] for [place], he is able to take referral .  Soc will begin 24 to 48 hrs post dc.  Patient does not want outpatient pt. Due to tranportation.  Patient is actually active with [facility] for RN, PT, OT, he did not remember.  PT to re-eval, may need SNF.          Expected Discharge Plan and Services                                                 Social Determinants of Health (SDOH) Interventions    Readmission Risk Interventions Readmission Risk Prevention Plan 08/21/2018  Transportation Screening Complete  PCP or Specialist Appt within 3-5 Days Complete  HRI or [place] Patient refused  Social Work Consult for [place] Planning/Counseling Complete  Palliative Care Screening Not Applicable  Medication Review Press photographer) Complete  Some recent data might be hidden

## 2020-07-10 NOTE — Progress Notes (Signed)
Received call from patient daughter wanting Dr Percival Spanish to call them with updates.  PA Dunn paged via amion with the above message and to make MD aware.

## 2020-07-10 NOTE — Progress Notes (Signed)
**Note De-Identified vi Obfusction** PROGRESS NOTE    [name]  INO:[id number] DOB: [birth year] DOA: 07/07/2020 PCP: [name], MD   Chief Complint  Ptient presents with  . Bck Pin  . Loss of Consciousness   Brief Nrrtive: [name] is  87 y.o. mle with medicl history significnt of fib on Coumdin; CVA; seizure d/o; CAD; hypothyroidism; HTN; HLD; nd DM presenting with syncope.  They left  week or two go for  trip to [place] with fmily.  He ws doing well when he left home but tht dy he noticed difficulty wlking.  He ws hving bck pin.  He thinks he ws wek in both legs.  They styed  few dys nd he just lid round nd  it got worse.  They cme home nd he ws so "demobilized."  He ws dmitted from 4/10-12 "nd tht's where I've been ever since... I think I ws in the hospitl nd cme bck home nd it didn't get no better so they brought me bck in."  He didn't get ny better t home.    He ws dmitted for  35-40 second spell, like pssing out, while he ws seted.    In review of his lst dmission, he presented to [facility] on 4/11 for B LE wekness nd bck pin.  He ws trnsferred to [facility] for [facility].  CRP nd ESR were negtive.  HTN medictions were djusted (lowered) but to hypotension.  He ws strted on Flomx for his urinry symptoms.  MRI brin ws without cute problems nd there ws mild cervicl stenosis noted.  MRI of the L spine showed inflmmtory signl in L1-2 thought to be cute on chronic disc/endplte degenertion with moderte to severe spinl stenosis.    Assessment & Pln:   Principl Problem:   Syncope Active Problems:   Dyslipidemi   HTN (hypertension)   Proxysml tril fibrilltion (HCC)   Hypothyroidism   Controlled type 2 dibetes mellitus with compliction, without long-term current use of insulin (HCC)   History of seizures   Acute midline low bck pin without scitic   Second degree AV block, Mobitz type II  Syncope -Ptient with prior  h/o stroke s well s known seizure d/o who presented with trnsient (<1 minute) episode of ltered sensorium/unresponsiveness this AM - this occurred while he ws seted -> tele notble for brdy events here (?mobitz 2).  He's on metoprolol, will hold this.   - crdiology c/s, pprecite recs -> recommending following nother 24 hrs -EEG without seizure/eplileptiform ctivity -continue tele monitoring  -negtive orthosttics -Troponins negtive x 2 -Hed CT negtive -2d echo ordered -> EF 09-62%, grde 1 distolic dysfunction (see report) -Neuro checks  -PT/OT evl nd tret  Brdycrdi  - tele with puses -> dropped bets, concerning for mobitz 2/high grde AV block? - hold metoprolol  - lso on clonidine, which could contribute, though metop more obvious contributor  - crdiology consulted in setting of concern for high grde AV block/syncope bove, pprecite ssistnce -> recommending monitoring overnight - ? Need for outptient monitor t dischrge?  Bck pin -Recent MRI with pprent severe lumbr spinl stenosis - repet MRI with unchnged ppernce of hyperintense T2 weighted signl within the endpltes t E3-6, no contrst enhncement in the disc spce.  Unchnged multilevel severe spondylosis with spinl cnl stenosis worst t L2-3 nd L4-5.  -Persistent wekness, difficulty mbulting, urinry incontinence -Neurosurgery consult requested, Dr. Annette Stble to see -> pin control nd mobiliztion with therpy, follow - no neurosurgicl intervention recommended -  Continue Lidoderm, Norco for pain  Urinary incontinence -Daughter reports significant issue with incontinence which is not baseline -Recently started on Flomax without improvement -discussed question of NPH with neurosurgery - will follow at this point - no recommendation for intervention at this point  Hematuria - has planned follow up with urology for above, can follow this as well, likely related to coumadin - follow  outpatient  Afib -Rate controlled -Continue Coumadin, pharmacy to dose  Seizure d/o -EEG, as above -Continue Carbamazepine and Keppra  Hypothyroidism -Elevated TSH -Continue Synthroid at current dose for now but needs outpatient PCP f/u  HTN -Continue Clonidine, Imdur, Cozaar. Lopressor on hold - follow BP for need for adjustment  HLD -Continue Lipitor  DM -Recent A1c 6.3 indicates good control -hold Glucophage -Cover with moderate-scale SSI  DVT prophylaxis: warfarin Code Status: full  Family Communication: son at bedside - called kim, no answer Disposition:   Status is: Inpatient  Remains inpatient appropriate because:Inpatient level of care appropriate due to severity of illness   Dispo: The patient is from: Home              Anticipated d/c is to: pending              Patient currently is not medically stable to d/c.   Difficult to place patient No   Consultants:   Neurosurgery  cardiology  Procedures: Echo IMPRESSIONS    1. Left ventricular ejection fraction, by estimation, is 50 to 55%. The  left ventricle has low normal function. The left ventricle has no regional  wall motion abnormalities. There is mild left ventricular hypertrophy.  Left ventricular diastolic  parameters are consistent with Grade I diastolic dysfunction (impaired  relaxation). Elevated left atrial pressure.  2. Right ventricular systolic function is normal. The right ventricular  size is normal. There is mildly elevated pulmonary artery systolic  pressure.  3. Left atrial size was severely dilated.  4. The mitral valve is normal in structure. Trivial mitral valve  regurgitation. No evidence of mitral stenosis. Moderate mitral annular  calcification.  5. The aortic valve is tricuspid. There is mild calcification of the  aortic valve. There is moderate thickening of the aortic valve. Aortic  valve regurgitation is not visualized. Mild to moderate aortic valve   sclerosis/calcification is present, without  any evidence of aortic stenosis.  6. There is mild dilatation of the ascending aorta, measuring 39 mm.   EEG IMPRESSION: This study is suggestive of mild diffuse encephalopathy, nonspecific etiology. No seizures or epileptiform discharges were seen throughout the recording.   Antimicrobials: Anti-infectives (From admission, onward)   None     Subjective: No new complaints today Concerns with frequent UOP - stopped IVF  Objective: Vitals:   07/10/20 0556 07/10/20 0846 07/10/20 0930 07/10/20 1055  BP: (!) 185/78 (!) 186/90 (!) 121/50 (!) 99/50  Pulse:   63 64  Resp:   16 17  Temp:   97.9 F (36.6 C) 97.9 F (36.6 C)  TempSrc:   Oral Oral  SpO2:   96% 98%  Weight:      Height:        Intake/Output Summary (Last 24 hours) at 07/10/2020 1521 Last data filed at 07/10/2020 1052 Gross per 24 hour  Intake 2575.78 ml  Output 4300 ml  Net -1724.22 ml   Filed Weights   07/07/20 2237 07/09/20 0314 07/10/20 0505  Weight: 78.5 kg 81.1 kg 79.1 kg    Examination:  General: No acute distress. Cardiovascular: **Note De-Identified vi Obfusction** Hert sounds show  regulr rte, nd rhythm. Lungs: Cler to usculttion bilterlly  bdomen: Soft, nontender, nondistended  Neurologicl: lert nd oriented 3. Moves ll extremities 4 . Crnil nerves II through XII grossly intct. Skin: Wrm nd dry. No rshes or lesions. Extremities: No clubbing or cynosis. No edem.  Dt Reviewed: I hve personlly reviewed following lbs nd imging studies  CBC: Recent Lbs  Lb 07/07/20 2241 07/09/20 0442 07/10/20 0443  WBC 7.8 8.2 6.8  NEUTROBS 4.7  --  3.5  HGB 10.7* 11.0* 11.4*  HCT 33.4* 33.3* 34.2*  MCV 96.8 95.7 94.7  PLT 300 300 846    Bsic Metbolic Pnel: Recent Lbs  Lb 07/07/20 2241 07/09/20 0442 07/10/20 0443  N 135 135 137  K 4.0 4.4 4.0  CL 103 101 103  CO2 _0 GLUCOSE 124* 109* 103*  BUN _1 CRETININE 1.08 1.06 0.94   CLCIUM 7.9* 8.5* 8.8*  MG  --   --  2.0  PHOS  --   --  3.2    GFR: Estimted Cretinine Clernce: 55.6 mL/min (by [place] formul bsed on SCr of 0.94 mg/dL).  Liver Function Tests: Recent Lbs  Lb 07/07/20 2241 07/10/20 0443  ST 16 16  LT 15 15  LKPHOS 62 60  BILITOT 0.4 0.4  PROT 5.2* 5.2*  LBUMIN 3.0* 3.0*    CBG: Recent Lbs  Lb 07/09/20 1120 07/09/20 1602 07/09/20 2033 07/10/20 0610 07/10/20 1056  GLUCP 126* 122* 121* 105* 151*     Recent Results (from the pst 240 hour(s))  Resp Pnel by RT-PCR (Flu &B, Covid) Urine, Ctheterized     Sttus: None   Collection Time: 07/08/20  4:30 M   Specimen: Urine, Ctheterized; Nsophryngel(NP) swbs in vil trnsport medium  Result Vlue Ref Rnge Sttus   SRS Coronvirus 2 by RT PCR NEGTIVE NEGTIVE Finl    Comment: (NOTE) SRS-CoV-2 trget nucleic cids re NOT DETECTED.  The SRS-CoV-2 RN is generlly detectble in upper respirtory specimens during the cute phse of infection. The lowest concentrtion of SRS-CoV-2 virl copies this ssy cn detect is 138 copies/mL.  negtive result does not preclude SRS-Cov-2 infection nd should not be used s the sole bsis for tretment or other ptient mngement decisions.  negtive result my occur with  improper specimen collection/hndling, submission of specimen other thn nsophryngel swb, presence of virl muttion(s) within the res trgeted by this ssy, nd indequte number of virl copies(<138 copies/mL).  negtive result must be combined with clinicl observtions, ptient history, nd epidemiologicl informtion. The expected result is Negtive.  Fct Sheet for Ptients:  EntrepreneurPulse.com.u  Fct Sheet for [place] Providers:  IncredibleEmployment.be  This test is no t yet pproved or clered by the [place] FD nd  hs been uthorized for detection nd/or dignosis of SRS-CoV-2  by FD under n Emergency Use uthoriztion (EU). This EU will remin  in effect (mening this test cn be used) for the durtion of the COVID-19 declrtion under Section 564(b)(1) of the ct, 21 U.S.C.section 360bbb-3(b)(1), unless the uthoriztion is terminted  or revoked sooner.       Influenz  by PCR NEGTIVE NEGTIVE Finl   Influenz B by PCR NEGTIVE NEGTIVE Finl    Comment: (NOTE) The Xpert Xpress SRS-CoV-2/FLU/RSV plus ssy is intended s n id in the dignosis of influenz from Nsophryngel swb specimens nd should not be used s  sole bsis for tretment. Nsl wshings nd spirtes re uncceptble for Xpert Xpress **Note De-Identified vi Obfusction** SARS-CoV-2/FLU/RSV testing.  Fct Sheet for Ptients: EntrepreneurPulse.com.u  Fct Sheet for [place] Providers: IncredibleEmployment.be  This test is not yet pproved or clered by the [place] FDA nd hs been uthorized for detection nd/or dignosis of SARS-CoV-2 by FDA under n Emergency Use Authoriztion (EUA). This EUA will remin in effect (mening this test cn be used) for the durtion of the COVID-19 declrtion under Section 564(b)(1) of the Act, 21 U.S.C. section 360bbb-3(b)(1), unless the uthoriztion is terminted or revoked.  Performed t [facility], [place] [address] [place], [place] [address]          Rdiology Studies: MR Lumbr Spine W Wo Contrst  Result Dte: 07/08/2020 CLINICAL DATA:  Severe low bck pin EXAM: MRI LUMBAR SPINE WITHOUT AND WITH CONTRAST TECHNIQUE: Multiplnr nd multiecho pulse sequences of the lumbr spine were obtined without nd with intrvenous contrst. CONTRAST:  7.34m GADAVIST GADOBUTROL 1 MMOL/ML IV SOLN COMPARISON:  06/30/2020 FINDINGS: Segmenttion:  Stndrd. Alignment:  Physiologic. Vertebre: Hyperintense T2-weighted signl within the endpltes t LD5-3is unchnged. There is mild contrst enhncement. No enhncement in the disc  spce. Conus medullris nd cud equin: Conus extends to the L1 level. Conus nd cud equin pper norml. Prspinl nd other soft tissues: Negtive. Disc levels: Compred to the MRI the lumbr spine performed 06/30/2020, there is no chnge in the degree of multilevel severe spondylosis. Spinl cnl stenosis remins worst t L2-3 nd L4-5. IMPRESSION: 1. Unchnged ppernce of hyperintense T2-weighted signl within the endpltes t LG9-9 No contrst enhncement in the disc spce. Findings re most likely degenertive. 2. Unchnged multilevel severe spondylosis with spinl cnl stenosis worst t L2-3 nd L4-5. Electroniclly Signed   By: KUlyses JrredM.D.   On: 07/08/2020 19:07   EEG dult  Result Dte: 07/09/2020 YLor Hvens MD     07/09/2020  8:53 AM Ptient Nme: Daniel OBRYANMRN: [id number] Attending: PLor HvensReferring Physicin/Provider: Dr. JKrmen BongoDte: 07/08/2020 Durtion: 24.15 mins Ptient history: 89yer old mle with prior history of stroke s well s seizure presented with trnsient episode of ltertion of wreness.  EEG to evlute for seizures. Level of lertness: Awke AEDs during EEG study: Crbmzepine, Keppr Technicl spects: This EEG study ws done with sclp electrodes positioned ccording to the 10-20 Interntionl system of electrode plcement. Electricl ctivity ws cquired t  smpling rte of 500Hz nd reviewed with  high frequency filter of 70Hz nd  low frequency filter of 1Hz. EEG dt were recorded continuously nd digitlly stored. Description: The posterior dominnt rhythm consists of 8 Hz ctivity of moderte voltge (25-35 uV) seen predominntly in posterior hed regions, symmetric nd rective to eye opening nd eye closing. EEG showed intermittent generlized 3 to 6 Hz thet-delt slowing. Physiologic photic driving ws not seen during photic stimultion.  Hyperventiltion ws not performed.   ABNORMALITY - Intermittent  slow, generlized IMPRESSION: This study is suggestive of mild diffuse encephlopthy, nonspecific etiology. No seizures or epileptiform dischrges were seen throughout the recording. Priynk OBrbr Srks       Scheduled Meds: . torvsttin  80 mg Orl QPM  . crbmzepine  300 mg Orl BID  . cloNIDine  0.2 mg Orl BID  . escitloprm  5 mg Orl QHS  . insulin sprt  0-15 Units Subcutneous TID WC  . insulin sprt  0-5 Units Subcutneous QHS  . isosorbide mononitrte  60 mg Orl Dily  . levETIRAcetm  250 mg Orl BID  . levothyroxine  75 mcg  Oral QAC breakfast  . lidocaine  1 patch Transdermal Q24H  . losartan  50 mg Oral Daily  . sodium chloride flush  3 mL Intravenous Q12H  . tamsulosin  0.4 mg Oral QPC supper  . warfarin  7.5 mg Oral Q M,W,F,Su-1800   And  . warfarin  11.25 mg Oral Q T,Th,Sat-1800  . Warfarin - Pharmacist Dosing Inpatient   Does not apply q1600   Continuous Infusions:    LOS: 2 days    Time spent: over 30 min    Fayrene Helper, MD [facility]   To contact the attending provider between 7A-7P or the covering provider during after hours 7P-7A, please log into the web site [email or web address] and access using universal [place] password for that web site. If you do not have the password, please call the hospital operator.  07/10/2020, 3:21 PM

## 2020-07-11 ENCOUNTER — Inpatient Hospital Stay (INDEPENDENT_AMBULATORY_CARE_PROVIDER_SITE_OTHER): Payer: Medicare Other

## 2020-07-11 DIAGNOSIS — I441 Atrioventricular block, second degree: Secondary | ICD-10-CM

## 2020-07-11 DIAGNOSIS — R55 Syncope and collapse: Secondary | ICD-10-CM

## 2020-07-11 LAB — BASIC METABOLIC PANEL
Anion gap: 8 (ref 5–15)
BUN: 21 mg/dL (ref 8–23)
CO2: 27 mmol/L (ref 22–32)
Calcium: 8.6 mg/dL — ABNORMAL LOW (ref 8.9–10.3)
Chloride: 100 mmol/L (ref 98–111)
Creatinine, Ser: 0.99 mg/dL (ref 0.61–1.24)
GFR, Estimated: >60 mL/min (ref >60)
Glucose, Bld: 104 mg/dL — ABNORMAL HIGH (ref 70–99)
Potassium: 4.1 mmol/L (ref 3.5–5.1)
Sodium: 135 mmol/L (ref 135–145)

## 2020-07-11 LAB — GLUCOSE, CAPILLARY
Glucose-Capillary: 111 mg/dL — ABNORMAL HIGH (ref 70–99)
Glucose-Capillary: 146 mg/dL — ABNORMAL HIGH (ref 70–99)
Glucose-Capillary: 113 mg/dL — ABNORMAL HIGH (ref 70–99)

## 2020-07-11 LAB — URINALYSIS, ROUTINE W REFLEX MICROSCOPIC
Bilirubin Urine: NEGATIVE
Glucose, UA: NEGATIVE mg/dL
Ketones, ur: NEGATIVE mg/dL
Nitrite: NEGATIVE
Protein, ur: NEGATIVE mg/dL
Specific Gravity, Urine: 1.015 (ref 1.005–1.030)
pH: 6 (ref 5.0–8.0)

## 2020-07-11 LAB — CBC
HCT: 33.9 % — ABNORMAL LOW (ref 39.0–52.0)
Hemoglobin: 11.4 g/dL — ABNORMAL LOW (ref 13.0–17.0)
MCH: 31.7 pg (ref 26.0–34.0)
MCHC: 33.6 g/dL (ref 30.0–36.0)
MCV: 94.2 fL (ref 80.0–100.0)
Platelets: 310 10*3/uL (ref 150–400)
RBC: 3.6 MIL/uL — ABNORMAL LOW (ref 4.22–5.81)
RDW: 15 % (ref 11.5–15.5)
WBC: 7.8 10*3/uL (ref 4.0–10.5)
nRBC: 0 % (ref 0.0–0.2)

## 2020-07-11 LAB — PROTIME-INR
INR: 2.4 — ABNORMAL HIGH (ref 0.8–1.2)
Prothrombin Time: 26.1 seconds — ABNORMAL HIGH (ref 11.4–15.2)

## 2020-07-11 MED ORDER — SODIUM CHLORIDE 0.9 % IV SOLN
1.0000 g | INTRAVENOUS | Status: AC
  Administered 2020-07-11: 1 g via INTRAVENOUS

## 2020-07-11 MED ORDER — CEPHALEXIN 250 MG PO CAPS: 250.0000 mg | ORAL_CAPSULE | Freq: Four times a day (QID) | ORAL | 0 refills | Status: AC

## 2020-07-11 MED ORDER — DICLOFENAC SODIUM 1 % EX GEL
2.0000 g | Freq: Four times a day (QID) | CUTANEOUS | Status: DC
  Filled 2020-07-11: qty 100

## 2020-07-11 NOTE — Discharge Instructions (Signed)

## 2020-07-11 NOTE — Care Management Important Message (Signed)
Important Message  Patient Details  Name: [name] MRN: [id number] Date of Birth: Nov 21, 1933   Medicare Important Message Given:  Yes     [name] 07/11/2020, 10:10 AM

## 2020-07-11 NOTE — Progress Notes (Addendum)
14 day Zio requested by Dr. Percival Spanish. I spoke to patient about this and also confirmed plan with daughter Maudie Mercury who serves as emergency contact. Order placed, Dr. Percival Spanish to read. I also encouraged Maudie Mercury to have her dad keep f/u with PCP on 4/26 as scheduled to discuss getting back on track with INR checks. She verbalized understanding/gratitude. I spoke with Pearl with the [facility] and she will help arrange with EKG. Per our discussions they will have EKG tech place monitor prior to discharge home today.  Addendum: per Jeannetta Nap w/ EKG, Zio placed. Primary team updated.

## 2020-07-11 NOTE — Progress Notes (Signed)
Zio patch placed onto patient.  All instructions and information reviewed with patient, they verbalize understanding with no questions. 

## 2020-07-11 NOTE — Plan of Care (Signed)
  Problem: Acute Rehab PT Goals(only PT should resolve) Goal: Pt will Roll Supine to Side Outcome: Adequate for Discharge Goal: Pt Will Go Supine/Side To Sit Outcome: Adequate for Discharge Goal: Patient Will Transfer Sit To/From Stand Outcome: Adequate for Discharge Goal: Pt Will Ambulate Outcome: Adequate for Discharge Goal: Pt Will Go Up/Down Stairs Outcome: Adequate for Discharge Goal: Pt/caregiver will Perform Home Exercise Program Outcome: Adequate for Discharge   Problem: Acute Rehab OT Goals (only OT should resolve) Goal: Pt. Will Perform Grooming Outcome: Adequate for Discharge Goal: Pt. Will Perform Lower Body Bathing Outcome: Adequate for Discharge Goal: Pt. Will Perform Lower Body Dressing Outcome: Adequate for Discharge Goal: Pt. Will Transfer To Toilet Outcome: Adequate for Discharge Goal: Pt. Will Perform Toileting-Clothing Manipulation Outcome: Adequate for Discharge

## 2020-07-11 NOTE — Progress Notes (Signed)
Physical Therapy Treatment Patient Details Name: [name] MRN: [id number] DOB: 1933-04-20 Today's Date: 07/11/2020    History of Present Illness The pt is an 87 yo male presenting 4/18 with "low HR" and "slower than normal responses" as well as some dizziness. Imaging revealed no acute abnormalities, significant multilevel spondylitic disease with significant stenosis particularly at L2-3 but also at L1-2, L3-4 and L4-5.  PMH includes: recent admission for progressive LE weakness and back pain, a flutter, BPH, CAD s/p MI and stenting, DM , HLD, HTN, meniere's disease, RTC repair, seizures, CVA with residual R hand weakness, HF.    PT Comments    Pt received in chair, agreeable to therapy session and with good participation and tolerance for gait and stair training. Pt needing Supervision for transfers and up to min guard assist for gait trial but does need increased assist up to modA when descending 7" steps. Pt with improved activity tolerance this date and reports 5/10 modified RPE (fatigue) at end of session, pt had also worked with OT prior. Pt continues to benefit from PT services to progress toward functional mobility goals. Family would like SNF but if increased supervision/assist for iADLs available would continue to recommend HHPT.   Follow Up Recommendations  Home health PT;Other (comment);Supervision for mobility/OOB (increased supervision/assist for iADLs if possible.)     Equipment Recommendations  None recommended by PT    Recommendations for Other Services       Precautions / Restrictions Precautions Precautions: Fall Precaution Comments: history of falls Restrictions Weight Bearing Restrictions: No    Mobility  Bed Mobility Overal bed mobility: Needs Assistance Bed Mobility: Rolling;Supine to Sit Rolling: Supervision Sidelying to sit: Supervision       General bed mobility comments: received in chair    Transfers Overall transfer level: Needs  assistance Equipment used: Rolling walker (2 wheeled) Transfers: Sit to/from Stand Sit to Stand: Supervision         General transfer comment: increased time to power up, but no physical assist needed. cues for posture in standing; from EOB and chair heights multiple reps  Ambulation/Gait Ambulation/Gait assistance: Min guard Gait Distance (Feet): 200 Feet (including 2 standing breaks) Assistive device: Rolling walker (2 wheeled) Gait Pattern/deviations: Step-through pattern;Decreased stride length;Trunk flexed;Decreased step length - left Gait velocity: 0.3 m/s   General Gait Details: decreased stride length, decreased L step length, some hip external rotation bilaterally. cues for posture, positioning in RW as he tends to hold it too far ahead especially in tight spaces   Stairs Stairs: Yes Stairs assistance: Mod assist;Min assist Stair Management: One rail Left;Step to pattern;Forwards Number of Stairs: 2 General stair comments: L rail ascending, R rail descending and HHA on opposite side, needs minA to ascend and modA to descend due to partial knee buckling/weakness but able to stabilize with modA and standing break   Wheelchair Mobility    Modified Rankin (Stroke Patients Only)       Balance Overall balance assessment: Needs assistance Sitting-balance support: Feet supported Sitting balance-Leahy Scale: Good     Standing balance support: Bilateral upper extremity supported Standing balance-Leahy Scale: Poor Standing balance comment: reliant on BUE support for gait       Cognition Arousal/Alertness: Awake/alert Behavior During Therapy: WFL for tasks assessed/performed Overall Cognitive Status: Within Functional Limits for tasks assessed        General Comments: pt with good following of 1 and 2-step cues, increased processing time needed at times.      Exercises  Other Exercises Other Exercises: serial STS x5 reps from EOB    General Comments General  comments (skin integrity, edema, etc.): briefs remained donned throughout due to prior incontinence; HR 66-84 bpm; Spo2 94-98%; BP 113/64 prior to mobility and no c/o dizziness or sudden BLE weakness      Pertinent Vitals/Pain Pain Assessment: No/denies pain           PT Goals (current goals can now be found in the care plan section) Acute Rehab PT Goals Patient Stated Goal: go home, get my legs stronger PT Goal Formulation: With patient Time For Goal Achievement: 07/23/20 Potential to Achieve Goals: Good Progress towards PT goals: Progressing toward goals    Frequency    Min 3X/week      PT Plan Current plan remains appropriate       AM-PAC PT "6 Clicks" Mobility   Outcome Measure  Help needed turning from your back to your side while in a flat bed without using bedrails?: A Little Help needed moving from lying on your back to sitting on the side of a flat bed without using bedrails?: A Little Help needed moving to and from a bed to a chair (including a wheelchair)?: A Little Help needed standing up from a chair using your arms (e.g., wheelchair or bedside chair)?: A Little Help needed to walk in hospital room?: A Little Help needed climbing 3-5 steps with a railing? : A Little 6 Click Score: 18    End of Session Equipment Utilized During Treatment: Gait belt Activity Tolerance: Patient tolerated treatment well Patient left: in chair;with call bell/phone within reach;with chair alarm set Nurse Communication: Mobility status PT Visit Diagnosis: Other abnormalities of gait and mobility (R26.89);Difficulty in walking, not elsewhere classified (R26.2)     Time: 4967-5916 PT Time Calculation (min) (ACUTE ONLY): 21 min  Charges:  $Gait Training: 8-22 mins                     Verlena Marlette P., PTA Acute Rehabilitation Services Pager: [phone] Office: [place] 07/11/2020, 1:39 PM

## 2020-07-11 NOTE — Progress Notes (Signed)
   Providing Compassionate, Quality Care - Together   Subjective: Patient reports he has ambulated in the hall without difficulty. He denies pain.  Objective: Vital signs in last 24 hours: Temp:  [97.9 F (36.6 C)-98.9 F (37.2 C)] 98.9 F (37.2 C) (04/22 0745) Pulse Rate:  [57-66] 62 (04/22 0745) Resp:  [16-17] 16 (04/22 0745) BP: (99-151)/(50-68) 151/68 (04/22 0745) SpO2:  [96 %-100 %] 96 % (04/22 0745) Weight:  [77.8 kg] 77.8 kg (04/22 0406)  Intake/Output from previous day: 04/21 0701 - 04/22 0700 In: 990 [P.O.:990] Out: 950 [Urine:950] Intake/Output this shift: No intake/output data recorded.  Alert and oriented Speech clear, fluent MAE, Strength and sensation intact, aside from right triceps 4/5  Lab Results: Recent Labs    07/10/20 0443 07/11/20 0415  WBC 6.8 7.8  HGB 11.4* 11.4*  HCT 34.2* 33.9*  PLT 303 310   BMET Recent Labs    07/10/20 0443 07/11/20 0415  NA 137 135  K 4.0 4.1  CL 103 100  CO2 29 27  GLUCOSE 103* 104*  BUN 15 21  CREATININE 0.94 0.99  CALCIUM 8.8* 8.6*    Studies/Results: No results found.  Assessment/Plan: Patient with lumbar pain and weakness that have worsened over the last few weeks. MRI lumbar spine showed multilevel spondylitic disease with stenosis. MRI brain showed that patient has some degree of communicating hydrocephalus. This does not need treatment at this time and can he can follow up with Dr. Annette Stable for continued monitoring as an outpatient.   LOS: 3 days    -No Neurosurgical interventions recommended. -Neurosurgery will sign off at this time. Dr. Marchelle Folks information has been added to discharge information for patient.  Viona Gilmore, DNP, AGNP-C Nurse Practitioner  [facility] [place] [address] [place], [place], [place] [address] P: [phone]    F: [phone]  07/11/2020, 10:15 AM

## 2020-07-11 NOTE — Discharge Summary (Signed)
Physician Discharge Summary  [name] DGU:[id number] DOB: Jul 23, 1933 DOA: 07/07/2020  PCP: [name], MD  Admit date: 07/07/2020 Discharge date: 07/11/2020  Time spent: 40 minutes  Recommendations for Outpatient Follow-up:  1. Follow outpatient CBC/CMP 2. Stop metoprolol - avoid AV nodal blockers - if recurrent bradycardia, consider discontinuing clonidine 3. Follow results of cardiac monitor outpatient with cardiology 4. Urinary frequency - UA borderline for UTI, but in setting of new frequency, treating empirically - follow final urine culture outpatient with PCP - stop antibiotics if culture not suggestive of UTI 5. Follow hematuria outpatient with urology 6. Follow up with neurosurgery outpatient for spinal stenosis and normal pressure hydrocephalus 7. Follow repeat thyroid function tests outpatient, adjust synthroid as needed 8. Follow INR outpatient with PCP   Discharge Diagnoses:  Principal Problem:   Syncope Active Problems:   Dyslipidemia   HTN (hypertension)   Paroxysmal atrial fibrillation (HCC)   Hypothyroidism   Controlled type 2 diabetes mellitus with complication, without long-term current use of insulin (HCC)   History of seizures   Acute midline low back pain without sciatica   Second degree AV block, Mobitz type II   Discharge Condition: stable  Diet recommendation: heart healthy  Filed Weights   07/09/20 0314 07/10/20 0505 07/11/20 0406  Weight: 81.1 kg 79.1 kg 77.8 kg    History of present illness:  [name] [name] 87 y.o.malewith medical history significant ofafibon Coumadin; CVA; seizure d/o; CAD; hypothyroidism; HTN; HLD; and DM presenting with syncope.They left [name] week or two ago for [name] trip to the [place] with family. He was doing well when he left home but that day he noticed difficulty walking. He was having back pain. He thinks he was weak in both legs. They stayed [name] few days and he just laid around and it got worse.  They came home and he was so "demobilized." He was admitted from 4/10-12 "and that's where I've been ever since... I think I was in the hospital and came back home and it didn't get no better so they brought me back in." He didn't get any better at home.   He was admitted for [name] 35-40 second spell, like passing out, while he was seated.    In review of his last admission, he presented to [facility] on 4/11 for B LE weakness and back pain. He was transferred to [facility] for MRI. CRP and ESR were negative. HTN medications were adjusted (lowered) but to hypotension. He was started on Flomax for his urinary symptoms. MRI brain was without acute problems and there was mild cervical stenosis noted. MRI of the L spine showed inflammatory signal in L1-2 thought to be acute on chronic disc/endplate degeneration with moderate to severe spinal stenosis.   He was admitted with presyncopal episode, low back pain and weakness.  He was seen by neurosurgery in house who did not recommend any surgical interventions at this time.  Hospitalization complicated by high grade AV block, which could be underlying cause for his presyncopal episode.  Metoprolol was discontinued.  Cardiology was consulted.  See below for additional details.  Discharged home in stable condition 4/22.   Hospital Course:  Syncope -Patient with prior h/o stroke as well as known seizure d/o who presented with transient (<1 minute) episode of altered sensorium/unresponsiveness this AM - this occurred while he was seated -> tele notable for brady events here (high grade AV block).  He was on metoprolol, will hold this.   - cardiology  c/s, appreciate recs -> discontinued metoprolol.  Discharge home with event monitor.   -EEG without seizure/eplileptiform activity -continue tele monitoring  -negative orthostatics -Troponinsnegative x 2 -Head CT negative -2d echoordered -> EF 82-80%, grade 1 diastolic dysfunction (see report) -Neuro checks   -PT/OT eval and treat  Bradycardia  - tele with pauses -> dropped beats, concerning for mobitz 2/high grade AV block? - hold metoprolol  - also on clonidine, which could contribute, though metop more obvious contributor  - cardiology consulted in setting of concern for high grade AV block/syncope above, appreciate assistance -> metoprolol discontinued, ziopatch placed  Back pain -Recent MRI with apparent severe lumbar spinal stenosis - repeat MRI with unchanged appearance of hyperintense T2 weighted signal within the endplates at K3-4, no contrast enhancement in the disc space.  Unchanged multilevel severe spondylosis with spinal canal stenosis worst at L2-3 and L4-5.  -Persistent weakness, difficulty ambulating, urinary incontinence -Neurosurgery consult requested, Dr. Annette Stable to see -> pain control and mobilization with therapy, follow - no neurosurgical intervention recommended - follow outpatient  -Continue Lidoderm, Norco for pain  Urinary incontinence  Urinary Frequency -Daughter reports significant issue with incontinence which is not baseline - this is new over past several weeks -Recently started on Flomax without improvement -discussed question of NPH with neurosurgery - will follow at this point - no recommendation for intervention at this point follow outpatient  - follow urine culture, UA borderline, not clearly indicative of UTI, but in setting of new urinary frequncy, certainly could fit -> discussed empiric abx with daughter, risks/benefits -> will treat empirically, needs to follow final cx with PCP  Hematuria - has planned follow up with urology for above, can follow this as well, likely related to coumadin - follow outpatient - could also be related to UTI, follow culture  Afib -Rate controlled -Continue Coumadin - follow INR with PCP outpatient   Seizure d/o -EEG, as above -Continue Carbamazepine and Keppra  Hypothyroidism -ElevatedTSH -Continue  Synthroid at current dose for nowbut needs outpatient PCP f/u  HTN -Continue Clonidine, Imdur, Cozaar. Lopressor on hold - follow BP for need for adjustment  HLD -Continue Lipitor  DM -Recent A1c 6.3 indicates good control -hold Glucophage -Cover with moderate-scale SSI  Procedures: Echo IMPRESSIONS    1. Left ventricular ejection fraction, by estimation, is 50 to 55%. The  left ventricle has low normal function. The left ventricle has no regional  wall motion abnormalities. There is mild left ventricular hypertrophy.  Left ventricular diastolic  parameters are consistent with Grade I diastolic dysfunction (impaired  relaxation). Elevated left atrial pressure.  2. Right ventricular systolic function is normal. The right ventricular  size is normal. There is mildly elevated pulmonary artery systolic  pressure.  3. Left atrial size was severely dilated.  4. The mitral valve is normal in structure. Trivial mitral valve  regurgitation. No evidence of mitral stenosis. Moderate mitral annular  calcification.  5. The aortic valve is tricuspid. There is mild calcification of the  aortic valve. There is moderate thickening of the aortic valve. Aortic  valve regurgitation is not visualized. Mild to moderate aortic valve  sclerosis/calcification is present, without  any evidence of aortic stenosis.  6. There is mild dilatation of the ascending aorta, measuring 39 mm.   EEG IMPRESSION: This study is suggestive of mild diffuse encephalopathy, nonspecific etiology. No seizures or epileptiform discharges were seen throughout the recording.  Consultations:  Cardiology   neurosurgery  Discharge Exam: Vitals:   07/11/20 0745  07/11/20 1118  BP: (!) 151/68 113/64  Pulse: 62 66  Resp: 16 16  Temp: 98.9 F (37.2 C) 98 F (36.7 C)  SpO2: 96% 98%   No new complaints today Long discussion with both sisters today Discussed d/c plan   General: No acute  distress. Cardiovascular: Heart sounds show Dravyn Severs regular rate, and rhythm.  Lungs: Clear to auscultation bilaterally  Abdomen: Soft, nontender, nondistended  Neurological: Alert and oriented 3. Moves all extremities 4. Cranial nerves II through XII grossly intact. Skin: Warm and dry. No rashes or lesions. Extremities: No clubbing or cyanosis. No edema.   Discharge Instructions   Discharge Instructions    (HEART FAILURE PATIENTS) Call MD:  Anytime you have any of the following symptoms: 1) 3 pound weight gain in 24 hours or 5 pounds in 1 week 2) shortness of breath, with or without Myking Sar dry hacking cough 3) swelling in the hands, feet or stomach 4) if you have to sleep on extra pillows at night in order to breathe.   Complete by: As directed    Call MD for:  difficulty breathing, headache or visual disturbances   Complete by: As directed    Call MD for:  extreme fatigue   Complete by: As directed    Call MD for:  hives   Complete by: As directed    Call MD for:  persistant dizziness or light-headedness   Complete by: As directed    Call MD for:  persistant nausea and vomiting   Complete by: As directed    Call MD for:  redness, tenderness, or signs of infection (pain, swelling, redness, odor or green/yellow discharge around incision site)   Complete by: As directed    Call MD for:  severe uncontrolled pain   Complete by: As directed    Call MD for:  temperature >100.4   Complete by: As directed    Diet - low sodium heart healthy   Complete by: As directed    Discharge instructions   Complete by: As directed    You came for an episode of presyncope (near fainting).  We think this was probably related to your slow heart rate in the setting of your metoprolol.  This has been stopped.  You're going home with Ainsleigh Kakos cardiac event montior which you can follow up with cardiology.    You were seen by neurosurgery for your spinal stenosis and normal pressure hydrocephalus.  Both of these issues were  thought to be nonoperative.  Please follow up with them outpatient.  Follow up with [name] for your INR checks.    You had microscopic blood in your urine.  Please follow this up with urology as an outpatient.  Given your increase in urinary frequency recently, we'll try treating you for Iosefa Weintraub UTI.  Please follow up with [name] regarding your final urine culture results on Monday.  You should have repeat thyroid studies in [place] few weeks as an outpatient to ensure your dosing is correct.   Return for new, recurrent, or worsening symptoms.  Please ask your PCP to request records from this hospitalization so they know what was done and what the next steps will be.   Increase activity slowly   Complete by: As directed      Allergies as of 07/11/2020      Reactions   Penicillins    Unknown reaction      Medication List    STOP taking these medications   metoprolol tartrate  100 MG tablet Commonly known as: LOPRESSOR     TAKE these medications   accu-chek safe-t pro lancets 1 each by Other route as needed. Use as instructed   acetaminophen 325 MG tablet Commonly known as: TYLENOL Take 1-2 tablets (325-650 mg total) by mouth every 4 (four) hours as needed for mild pain.   albuterol 108 (90 Base) MCG/ACT inhaler Commonly known as: VENTOLIN HFA Inhale 2 puffs into the lungs every 4 (four) hours as needed for wheezing or shortness of breath.   atorvastatin 80 MG tablet Commonly known as: LIPITOR TAKE 1 TABLET BY MOUTH EVERY DAY 6PM What changed: See the new instructions.   carbamazepine 300 MG 12 hr capsule Commonly known as: CARBATROL TAKE 1 CAPSULE BY MOUTH TWICE Azai Gaffin DAY   cephALEXin 250 MG capsule Commonly known as: KEFLEX Take 1 capsule (250 mg total) by mouth 4 (four) times daily for 6 days. Start taking on: July 12, 2020   cloNIDine 0.2 MG tablet Commonly known as: CATAPRES TAKE 1 TABLET(0.2 MG) BY MOUTH TWICE DAILY What changed: See the new instructions.    escitalopram 5 MG tablet Commonly known as: LEXAPRO TAKE 1 TABLET BY MOUTH EVERYDAY AT BEDTIME What changed:   how much to take  how to take this  when to take this  additional instructions   furosemide 20 MG tablet Commonly known as: LASIX Take 0.5 tablets (10 mg total) by mouth daily.   glucose blood test strip 1 each by Other route as needed. Use as instructed   glucose monitoring kit monitoring kit 1 each by Does not apply route as needed.   HYDROcodone-acetaminophen 5-325 MG tablet Commonly known as: Norco Take 1 tablet by mouth every 6 (six) hours as needed for moderate pain.   isosorbide mononitrate 60 MG 24 hr tablet Commonly known as: IMDUR Take 0.5 tablets (30 mg total) by mouth daily. What changed: how much to take   levETIRAcetam 250 MG tablet Commonly known as: KEPPRA Take 1 tablet (250 mg total) by mouth 2 (two) times daily.   levothyroxine 75 MCG tablet Commonly known as: SYNTHROID TAKE 1 TABLET(75 MCG) BY MOUTH DAILY What changed: See the new instructions.   losartan 100 MG tablet Commonly known as: COZAAR Take 0.5 tablets (50 mg total) by mouth daily.   metFORMIN 500 MG tablet Commonly known as: GLUCOPHAGE Take 500 mg by mouth 2 (two) times daily with Mauri Temkin meal.   multivitamins ther. w/minerals Tabs tablet Take 1 tablet by mouth at bedtime.   tamsulosin 0.4 MG Caps capsule Commonly known as: FLOMAX Take 1 capsule (0.4 mg total) by mouth daily after supper.   warfarin 7.5 MG tablet Commonly known as: COUMADIN Take as directed. If you are unsure how to take this medication, talk to your nurse or doctor. Original instructions: TAKE 1&1/2 TABS TUESDAY, THURSDAY & SATURDAY*1 TAB ON SUNDAY, MONDAY, WEDNESDAY & FRIDAY WITH SUPPER What changed:   how much to take  how to take this  when to take this  additional instructions      Allergies  Allergen Reactions  . Penicillins     Unknown reaction    Follow-up Information    Minus Breeding, MD Follow up.   Specialty: Cardiology Why: [place] location - follow up Friday Jul 25, 2020 4:00 PM (Arrive by 3:45 PM) Contact information: [address] [place] [address] [phone]        Earnie Larsson, MD. Schedule an appointment as soon  as possible for Richad Ramsay visit in 2 week(s).   Specialty: Neurosurgery Contact information: [address] [place] [address] [place] Follow up.   Why: HHRN,HHPT, HHOT, HHAIDE, Social Worker       Pickard, Cletus Gash T, MD. Schedule an appointment as soon as possible for Delailah Spieth visit in 1 week(s).   Specialty: Family Medicine Contact information: [address] [place] [address] [phone]                The results of significant diagnostics from this hospitalization (including imaging, microbiology, ancillary and laboratory) are listed below for reference.    Significant Diagnostic Studies: DG Chest 2 View  Result Date: 06/29/2020 CLINICAL DATA:  Weakness EXAM: CHEST - 2 VIEW COMPARISON:  08/19/2018, 09/06/2016 FINDINGS: No focal opacity or pleural effusion. Stable cardiomediastinal silhouette with aortic atherosclerosis. No pneumothorax. IMPRESSION: No active cardiopulmonary disease. Electronically Signed   By: Donavan Foil M.D.   On: 06/29/2020 20:58   CT Head Wo Contrast  Result Date: 07/08/2020 CLINICAL DATA:  Syncope EXAM: CT HEAD WITHOUT CONTRAST TECHNIQUE: Contiguous axial images were obtained from the base of the skull through the vertex without intravenous contrast. COMPARISON:  06/29/2020 FINDINGS: Brain: Old left thalamic lacunar infarct. There is atrophy and chronic small vessel disease changes. No acute intracranial abnormality. Specifically, no hemorrhage, hydrocephalus, mass lesion, acute infarction, or significant intracranial injury. Vascular: No hyperdense vessel or unexpected calcification. Skull: No acute calvarial  abnormality. Sinuses/Orbits: No acute findings Other: None IMPRESSION: Atrophy, chronic microvascular disease. No acute intracranial abnormality. Electronically Signed   By: Rolm Baptise M.D.   On: 07/08/2020 02:28   CT Head Wo Contrast  Result Date: 06/29/2020 CLINICAL DATA:  Mental status change.  Weakness EXAM: CT HEAD WITHOUT CONTRAST TECHNIQUE: Contiguous axial images were obtained from the base of the skull through the vertex without intravenous contrast. COMPARISON:  09/18/2019 FINDINGS: Brain: There is no mass, hemorrhage or extra-axial collection. There is generalized atrophy without lobar predilection. Hypodensity of the white matter is most commonly associated with chronic microvascular disease. Old small vessel infarcts of the left thalamus and right centrum semiovale. Vascular: Atherosclerotic calcification of the vertebral and internal carotid arteries at the skull base. No abnormal hyperdensity of the major intracranial arteries or dural venous sinuses. Skull: The visualized skull base, calvarium and extracranial soft tissues are normal. Sinuses/Orbits: No fluid levels or advanced mucosal thickening of the visualized paranasal sinuses. No mastoid or middle ear effusion. The orbits are normal. IMPRESSION: 1. No acute intracranial abnormality. 2. Generalized atrophy and chronic microvascular ischemia. 3. Old small vessel infarcts of the left thalamus and right centrum semiovale. Electronically Signed   By: Ulyses Jarred M.D.   On: 06/29/2020 21:50   CT Lumbar Spine Wo Contrast  Result Date: 06/29/2020 CLINICAL DATA:  Bilateral leg weakness with low back pain EXAM: CT LUMBAR SPINE WITHOUT CONTRAST TECHNIQUE: Multidetector CT imaging of the lumbar spine was performed without intravenous contrast administration. Multiplanar CT image reconstructions were also generated. COMPARISON:  None. FINDINGS: Segmentation: Normal Alignment: Grade 1 retrolisthesis at L3-4 Vertebrae: No acute fracture or focal  pathologic process. Paraspinal and other soft tissues: Calcific aortic atherosclerosis. Left nephrolithiasis. Disc levels: L1-2: Disc space narrowing without herniation. No spinal canal stenosis. Facet hypertrophy with mild bilateral foraminal stenosis. L2-3: Disc space narrowing with endplate spurring. Mild facet hypertrophy. Mild spinal canal stenosis. Severe right and moderate left foraminal stenosis. L3-4:  Right asymmetric disc bulge with endplate spurring and mild facet hypertrophy. No spinal canal stenosis. Mild bilateral foraminal stenosis. L4-5: Right asymmetric disc bulge with endplate osteophytes. Moderate facet hypertrophy. Mild spinal canal stenosis. Severe right and moderate left foraminal stenosis. L5-S1: Left asymmetric disc bulge with endplate spurring. Facet hypertrophy. No spinal canal stenosis. Moderate right and severe left neural foraminal stenosis. IMPRESSION: 1. No acute fracture or static subluxation of the lumbar spine. 2. Mild spinal canal stenosis at L2-3 and L4-5. 3. Multilevel moderate-to-severe neural foraminal stenosis. Aortic Atherosclerosis (ICD10-I70.0). Electronically Signed   By: Ulyses Jarred M.D.   On: 06/29/2020 21:55   [name]  Result Date: 06/30/2020 CLINICAL DATA:  87 year old male with atrial fibrillation on Coumadin. Altered mental status. Prior stroke with right hand weakness. New bilateral lower extremity weakness for 3 or 4 days. EXAM: MRI HEAD WITHOUT CONTRAST TECHNIQUE: Multiplanar, multiecho pulse sequences of the brain and surrounding structures were obtained without intravenous contrast. COMPARISON:  Head CT yesterday.  Brain MRI 09/19/2019 and earlier. FINDINGS: Brain: Chronic left PCA territory ischemia including prominent chronic lacunar infarct of the left thalamus. No restricted diffusion to suggest acute infarction. No midline shift, mass effect, evidence of mass lesion, ventriculomegaly, extra-axial collection or acute intracranial  hemorrhage. Cervicomedullary junction and pituitary are within normal limits. Mild progression of patchy white matter T2 and FLAIR hyperintensity in the right corona radiata since last year. Superimposed chronic T2 heterogeneity in the bilateral basal ganglia and pons also seems slightly progressed. But otherwise stable gray and white matter signal, including chronic microhemorrhage in the left dorsal pons. Vascular: Major intracranial vascular flow voids appear stable since last year. Skull and upper cervical spine: Multilevel degenerative changes in the visible cervical spine, evidence of mild degenerative spinal stenosis at C4-C5 on series 9, image 12, not included previously. Background bone marrow signal within normal limits. Sinuses/Orbits: Trace new paranasal sinus mucosal thickening. Stable and negative orbits. Other: Mastoids remain well aerated. Grossly normal visible internal auditory structures. Negative visible scalp and face. IMPRESSION: 1. No acute intracranial abnormality. 2. Mild progression of bilateral cerebral white matter and basal ganglia chronic small vessel disease since last year. Stable chronic left PCA territory ischemia with involvement of the thalamus. 3. Cervical spine degeneration with mild spinal stenosis suspected at C4-C5. If there is myelopathy on physical exam then dedicated cervical spine MRI may be valuable. Electronically Signed   By: Genevie Ann M.D.   On: 06/30/2020 04:24   MR LUMBAR SPINE WO CONTRAST  Result Date: 06/30/2020 CLINICAL DATA:  87 year old male with atrial fibrillation on Coumadin. Altered mental status. Prior stroke with right hand weakness. New bilateral lower extremity weakness for 3 or 4 days. EXAM: MRI LUMBAR SPINE WITHOUT CONTRAST TECHNIQUE: Multiplanar, multisequence MR imaging of the lumbar spine was performed. No intravenous contrast was administered. COMPARISON:  Lumbar spine CT yesterday. CT Abdomen and Pelvis 10/17/2015. FINDINGS: Segmentation:   Normal on the CT yesterday. Alignment: Mild to moderate levoconvex lumbar scoliosis. Lumbar lordosis has not significantly changed since 2017. Vertebrae: Marrow edema in the L1 and L2 endplates eccentric to the left, with associated abnormal increased STIR signal in the disc space and mild edema in the medial left psoas muscle (series 15, image 16). However, vacuum phenomena within that disc space by CT has not significantly changed since 2017. And the right lateral endplates and other paraspinal soft tissues are spared. Schmorl's node related marrow edema in the T12 inferior endplate. Chronic degenerative endplate marrow signal changes  elsewhere. Background bone marrow signal within normal limits. Degenerative sclerosis anterior right sacral ala is chronic. Intact visible sacrum and SI joints. Conus medullaris and cauda equina: Conus extends to the T12-L1 level. No signal abnormality in the visible lower thoracic cord or conus. Spinal stenosis just below the conus at L1-L2 detailed below. Paraspinal and other soft tissues: Paraspinal soft tissues are negative aside from mild edema in the medial left psoas muscle stated earlier. Stable visible abdominal viscera. Moderate distension of the urinary bladder with partially visible bladder wall thickening. Disc levels: No lower thoracic spinal stenosis through T12-L1. L1-L2: Moderate to severe multifactorial spinal stenosis with disc space loss, circumferential disc osteophyte complex and mild to moderate facet and ligament flavum hypertrophy greater on the left. Moderate associated left lateral recess stenosis (left L2 nerve level). Moderate to severe left and mild to moderate right L1 foraminal stenosis. L2-L3: Disc space loss with multifactorial moderate spinal and left greater than right lateral recess stenosis. Circumferential disc osteophyte complex and moderate posterior element hypertrophy. Severe bilateral L2 foraminal stenosis. L3-L4: Disc space loss with  circumferential disc bulge. Lesser endplate spurring. Mild to moderate facet and ligament flavum hypertrophy. Mild to moderate spinal and bilateral lateral recess stenosis. Moderate bilateral L3 foraminal stenosis. L4-L5: Moderate to severe multifactorial spinal stenosis with circumferential disc osteophyte complex and up to moderate facet and ligament flavum hypertrophy greater on the right. Moderate to severe bilateral lateral recess and foraminal stenosis (L5 and L4 nerve levels, respectively). L5-S1: Disc space loss with bulky far lateral disc osteophyte complex. Mild to moderate facet hypertrophy. No spinal or lateral recess stenosis. Moderate to severe left and moderate right L5 foraminal stenosis. IMPRESSION: 1. Inflammatory signal in the L1-L2 disc, left lateral endplates, and the medial left psoas muscle. But in conjunction with the recent CT appearance this is more compatible with acute exacerbation of chronic disc and endplate degeneration than an infectious discitis/osteomyelitis. Query fever. Blood cultures recommended if the clinical picture is equivocal for infection. Subsequent moderate to severe spinal, moderate left lateral recess and moderate to severe left greater than right foraminal stenosis. 2. Lumbar spine degeneration elsewhere with multifactorial spinal stenosis L3-L4 (moderate) through L4-L5 (severe). At least moderate associated lateral recess and foraminal stenosis at each level. Moderate to severe left greater than right L5-S1 foraminal stenosis. 3. Distended, thick-walled urinary bladder. Query bladder outlet obstruction or bladder retention. Electronically Signed   By: Genevie Ann M.D.   On: 06/30/2020 04:36   MR Lumbar Spine W Wo Contrast  Result Date: 07/08/2020 CLINICAL DATA:  Severe low back pain EXAM: MRI LUMBAR SPINE WITHOUT AND WITH CONTRAST TECHNIQUE: Multiplanar and multiecho pulse sequences of the lumbar spine were obtained without and with intravenous contrast. CONTRAST:   7.71mL GADAVIST GADOBUTROL 1 MMOL/ML IV SOLN COMPARISON:  06/30/2020 FINDINGS: Segmentation:  Standard. Alignment:  Physiologic. Vertebrae: Hyperintense T2-weighted signal within the endplates at C5-8 is unchanged. There is mild contrast enhancement. No enhancement in the disc space. Conus medullaris and cauda equina: Conus extends to the L1 level. Conus and cauda equina appear normal. Paraspinal and other soft tissues: Negative. Disc levels: Compared to the MRI the lumbar spine performed 06/30/2020, there is no change in the degree of multilevel severe spondylosis. Spinal canal stenosis remains worst at L2-3 and L4-5. IMPRESSION: 1. Unchanged appearance of hyperintense T2-weighted signal within the endplates at I5-0. No contrast enhancement in the disc space. Findings are most likely degenerative. 2. Unchanged multilevel severe spondylosis with spinal canal stenosis worst at L2-3  and L4-5. Electronically Signed   By: Ulyses Jarred M.D.   On: 07/08/2020 19:07   DG Chest Portable 1 View  Result Date: 07/08/2020 CLINICAL DATA:  Cough EXAM: PORTABLE CHEST 1 VIEW COMPARISON:  06/29/2020 radiograph, 07/02/2011 CT FINDINGS: Low volumes and streaky opacities favoring atelectatic change. No consolidation, features of edema, pneumothorax, or effusion. Pulmonary vascularity is normally distributed. The cardiomediastinal contours are stable from priors with cardiomegaly and aortic atherosclerosis. No acute osseous or soft tissue abnormality. Degenerative changes are present in the imaged spine and shoulders. Telemetry leads overlie the chest. IMPRESSION: 1. Low volumes and streaky opacities favoring atelectatic change. 2. Stable cardiomegaly. 3.  Aortic Atherosclerosis (ICD10-I70.0). Electronically Signed   By: Lovena Le M.D.   On: 07/08/2020 02:07   EEG adult  Result Date: 07/09/2020 Lora Havens, MD     07/09/2020  8:53 AM Patient Name: [name] MRN: [id number] Epilepsy Attending: Lora Havens  Referring Physician/Provider: Dr. Karmen Bongo Date: 07/08/2020 Duration: 24.15 mins Patient history: 87 year old male with prior history of stroke as well as seizure presented with transient episode of alteration of awareness.  EEG to evaluate for seizures. Level of alertness: Awake AEDs during EEG study: Carbamazepine, Keppra Technical aspects: This EEG study was done with scalp electrodes positioned according to the 10-20 International system of electrode placement. Electrical activity was acquired at Collin Hendley sampling rate of 500Hz and reviewed with Nieves Chapa high frequency filter of 70Hz and Blossom Crume low frequency filter of 1Hz. EEG data were recorded continuously and digitally stored. Description: The posterior dominant rhythm consists of 8 Hz activity of moderate voltage (25-35 uV) seen predominantly in posterior head regions, symmetric and reactive to eye opening and eye closing. EEG showed intermittent generalized 3 to 6 Hz theta-delta slowing. Physiologic photic driving was not seen during photic stimulation.  Hyperventilation was not performed.   ABNORMALITY - Intermittent slow, generalized IMPRESSION: This study is suggestive of mild diffuse encephalopathy, nonspecific etiology. No seizures or epileptiform discharges were seen throughout the recording. Lora Havens   ECHOCARDIOGRAM COMPLETE  Result Date: 07/08/2020    ECHOCARDIOGRAM REPORT   Patient Name:   CORYDON SCHWEISS Date of Exam: 07/08/2020 Medical Rec #:  [id number]        Height:       68.0 in Accession #:    [id number]       Weight:       173.0 lb Date of Birth:  1933/05/18         BSA:          1.922 m Patient Age:    17 years         BP:           184/86 mmHg Patient Gender: M                HR:           64 bpm. Exam Location:  Inpatient Procedure: 2D Echo, Cardiac Doppler, Color Doppler and Intracardiac            Opacification Agent Indications:    Syncope R55  History:        Patient has prior history of Echocardiogram examinations, most                  recent 05/30/2018. CAD, Stroke and Carotid Disease,                 Arrythmias:LBBB; Risk Factors:Hypertension, Diabetes and  Dyslipidemia. Meniere's disease. History of Aflutter.  Sonographer:    Darlina Sicilian RDCS Referring Phys: [place]  1. Left ventricular ejection fraction, by estimation, is 50 to 55%. The left ventricle has low normal function. The left ventricle has no regional wall motion abnormalities. There is mild left ventricular hypertrophy. Left ventricular diastolic parameters are consistent with Grade I diastolic dysfunction (impaired relaxation). Elevated left atrial pressure.  2. Right ventricular systolic function is normal. The right ventricular size is normal. There is mildly elevated pulmonary artery systolic pressure.  3. Left atrial size was severely dilated.  4. The mitral valve is normal in structure. Trivial mitral valve regurgitation. No evidence of mitral stenosis. Moderate mitral annular calcification.  5. The aortic valve is tricuspid. There is mild calcification of the aortic valve. There is moderate thickening of the aortic valve. Aortic valve regurgitation is not visualized. Mild to moderate aortic valve sclerosis/calcification is present, without any evidence of aortic stenosis.  6. There is mild dilatation of the ascending aorta, measuring 39 mm. FINDINGS  Left Ventricle: Left ventricular ejection fraction, by estimation, is 50 to 55%. The left ventricle has low normal function. The left ventricle has no regional wall motion abnormalities. Definity contrast agent was given IV to delineate the left ventricular endocardial borders. The left ventricular internal cavity size was normal in size. There is mild left ventricular hypertrophy. Left ventricular diastolic parameters are consistent with Grade I diastolic dysfunction (impaired relaxation). Elevated left atrial pressure. Right Ventricle: The right ventricular size is normal. No increase in  right ventricular wall thickness. Right ventricular systolic function is normal. There is mildly elevated pulmonary artery systolic pressure. The tricuspid regurgitant velocity is 2.51  m/s, and with an assumed right atrial pressure of 8 mmHg, the estimated right ventricular systolic pressure is 04.8 mmHg. Left Atrium: Left atrial size was severely dilated. Right Atrium: Right atrial size was normal in size. Pericardium: There is no evidence of pericardial effusion. Mitral Valve: The mitral valve is normal in structure. Moderate mitral annular calcification. Trivial mitral valve regurgitation. No evidence of mitral valve stenosis. Tricuspid Valve: The tricuspid valve is normal in structure. Tricuspid valve regurgitation is mild. Aortic Valve: The aortic valve is tricuspid. There is mild calcification of the aortic valve. There is moderate thickening of the aortic valve. Aortic valve regurgitation is not visualized. Mild to moderate aortic valve sclerosis/calcification is present, without any evidence of aortic stenosis. Aortic valve mean gradient measures 7.2 mmHg. Aortic valve peak gradient measures 12.5 mmHg. Aortic valve area, by VTI measures 1.80 cm. Pulmonic Valve: The pulmonic valve was not well visualized. Pulmonic valve regurgitation is not visualized. Aorta: The aortic root is normal in size and structure. There is mild dilatation of the ascending aorta, measuring 39 mm. IAS/Shunts: No atrial level shunt detected by color flow Doppler.  LEFT VENTRICLE PLAX 2D LVIDd:         4.90 cm      Diastology LVIDs:         3.90 cm      LV e' medial:    3.92 cm/s LV PW:         1.20 cm      LV E/e' medial:  18.6 LV IVS:        1.30 cm      LV e' lateral:   5.33 cm/s LVOT diam:     2.20 cm      LV E/e' lateral: 13.7 LV SV:  76 LV SV Index:   39 LVOT Area:     3.80 cm  LV Volumes (MOD) LV vol d, MOD A2C: 125.0 ml LV vol d, MOD A4C: 129.0 ml LV vol s, MOD A2C: 65.1 ml LV vol s, MOD A4C: 65.6 ml LV SV MOD A2C:      59.9 ml LV SV MOD A4C:     129.0 ml LV SV MOD BP:      67.5 ml RIGHT VENTRICLE RV S prime:     12.30 cm/s TAPSE (M-mode): 2.4 cm LEFT ATRIUM             Index       RIGHT ATRIUM           Index LA diam:        4.00 cm 2.08 cm/m  RA Area:     16.20 cm LA Vol (A2C):   86.3 ml 44.91 ml/m RA Volume:   38.50 ml  20.04 ml/m LA Vol (A4C):   97.8 ml 50.90 ml/m LA Biplane Vol: 92.8 ml 48.29 ml/m  AORTIC VALVE AV Area (Vmax):    1.68 cm AV Area (Vmean):   1.67 cm AV Area (VTI):     1.80 cm AV Vmax:           176.80 cm/s AV Vmean:          125.200 cm/s AV VTI:            0.421 m AV Peak Grad:      12.5 mmHg AV Mean Grad:      7.2 mmHg LVOT Vmax:         78.30 cm/s LVOT Vmean:        54.900 cm/s LVOT VTI:          0.199 m LVOT/AV VTI ratio: 0.47  AORTA Ao Root diam: 3.60 cm Ao Asc diam:  3.80 cm MITRAL VALVE               TRICUSPID VALVE MV Area (PHT): 2.45 cm    TR Peak grad:   25.2 mmHg MV Decel Time: 310 msec    TR Vmax:        251.00 cm/s MV E velocity: 72.80 cm/s MV Tristin Gladman velocity: 87.00 cm/s  SHUNTS MV E/Bond Grieshop ratio:  0.84        Systemic VTI:  0.20 m                            Systemic Diam: 2.20 cm [name] Croitoru MD Electronically signed by Sanda Klein MD Signature Date/Time: 07/08/2020/2:10:02 PM    Final     Microbiology: Recent Results (from the past 240 hour(s))  Resp Panel by RT-PCR (Flu Shakerra Red&B, Covid) Urine, Catheterized     Status: None   Collection Time: 07/08/20  4:30 AM   Specimen: Urine, Catheterized; Nasopharyngeal(NP) swabs in vial transport medium  Result Value Ref Range Status   SARS Coronavirus 2 by RT PCR NEGATIVE NEGATIVE Final    Comment: (NOTE) SARS-CoV-2 target nucleic acids are NOT DETECTED.  The SARS-CoV-2 RNA is generally detectable in upper respiratory specimens during the acute phase of infection. The lowest concentration of SARS-CoV-2 viral copies this assay can detect is 138 copies/mL. Eligha Kmetz negative result does not preclude SARS-Cov-2 infection and should not be used as the  sole basis for treatment or other patient management decisions. Caitrin Pendergraph negative result may occur with  improper specimen collection/handling, submission of specimen  other than nasopharyngeal swab, presence of viral mutation(s) within the areas targeted by this assay, and inadequate number of viral copies(<138 copies/mL). Jedediah Noda negative result must be combined with clinical observations, patient history, and epidemiological information. The expected result is Negative.  Fact Sheet for Patients:  EntrepreneurPulse.com.au  Fact Sheet for Healthcare Providers:  IncredibleEmployment.be  This test is no t yet approved or cleared by the [place] FDA and  has been authorized for detection and/or diagnosis of SARS-CoV-2 by FDA under an Emergency Use Authorization (EUA). This EUA will remain  in effect (meaning this test can be used) for the duration of the COVID-19 declaration under Section 564(b)(1) of the Act, 21 U.S.C.section 360bbb-3(b)(1), unless the authorization is terminated  or revoked sooner.       Influenza Jayme Cham by PCR NEGATIVE NEGATIVE Final   Influenza B by PCR NEGATIVE NEGATIVE Final    Comment: (NOTE) The Xpert Xpress SARS-CoV-2/FLU/RSV plus assay is intended as an aid in the diagnosis of influenza from Nasopharyngeal swab specimens and should not be used as Aalyiah Camberos sole basis for treatment. Nasal washings and aspirates are unacceptable for Xpert Xpress SARS-CoV-2/FLU/RSV testing.  Fact Sheet for Patients: EntrepreneurPulse.com.au  Fact Sheet for Healthcare Providers: IncredibleEmployment.be  This test is not yet approved or cleared by the [place] FDA and has been authorized for detection and/or diagnosis of SARS-CoV-2 by FDA under an Emergency Use Authorization (EUA). This EUA will remain in effect (meaning this test can be used) for the duration of the COVID-19 declaration under Section 564(b)(1) of the  Act, 21 U.S.C. section 360bbb-3(b)(1), unless the authorization is terminated or revoked.  Performed at [facility], [place] [address], [place], [place] [address]      Labs: Basic Metabolic Panel: Recent Labs  Lab 07/07/20 2241 07/09/20 0442 07/10/20 0443 07/11/20 0415  NA 135 135 137 135  K 4.0 4.4 4.0 4.1  CL 103 101 103 100  CO2 _0 GLUCOSE 124* 109* 103* 104*  BUN _1 CREATININE 1.08 1.06 0.94 0.99  CALCIUM 7.9* 8.5* 8.8* 8.6*  MG  --   --  2.0  --   PHOS  --   --  3.2  --    Liver Function Tests: Recent Labs  Lab 07/07/20 2241 07/10/20 0443  AST 16 16  ALT 15 15  ALKPHOS 62 60  BILITOT 0.4 0.4  PROT 5.2* 5.2*  ALBUMIN 3.0* 3.0*   No results for input(s): LIPASE, AMYLASE in the last 168 hours. No results for input(s): AMMONIA in the last 168 hours. CBC: Recent Labs  Lab 07/07/20 2241 07/09/20 0442 07/10/20 0443 07/11/20 0415  WBC 7.8 8.2 6.8 7.8  NEUTROABS 4.7  --  3.5  --   HGB 10.7* 11.0* 11.4* 11.4*  HCT 33.4* 33.3* 34.2* 33.9*  MCV 96.8 95.7 94.7 94.2  PLT 300 300 303 310   Cardiac Enzymes: No results for input(s): CKTOTAL, CKMB, CKMBINDEX, TROPONINI in the last 168 hours. BNP: BNP (last 3 results) No results for input(s): BNP in the last 8760 hours.  ProBNP (last 3 results) No results for input(s): PROBNP in the last 8760 hours.  CBG: Recent Labs  Lab 07/10/20 1619 07/10/20 2003 07/11/20 0544 07/11/20 1118 07/11/20 1717  GLUCAP 131* 178* 111* 146* 113*       Signed:  Fayrene Helper MD.  [facility] 07/11/2020, 5:43 PM

## 2020-07-11 NOTE — Progress Notes (Signed)
ANTICOAGULATION CONSULT NOTE - Follow Up Consult  Pharmacy Consult for Warfarin Indication: atrial fibrillation  Allergies  Allergen Reactions  . Penicillins     Unknown reaction    Patient Measurements: Height: 5\' 8"  (172.7 cm) Weight: 77.8 kg (171 lb 8.3 oz) IBW/kg (Calculated) : 68.4  Vital Signs: Temp: 98 F (36.7 C) (04/22 1118) Temp Source: Oral (04/22 0745) BP: 113/64 (04/22 1118) Pulse Rate: 66 (04/22 1118)  Labs: Recent Labs    07/09/20 0442 07/10/20 0443 07/11/20 0415  HGB 11.0* 11.4* 11.4*  HCT 33.3* 34.2* 33.9*  PLT 300 303 310  LABPROT 25.0* 25.3* 26.1*  INR 2.3* 2.3* 2.4*  CREATININE 1.06 0.94 0.99    Estimated Creatinine Clearance: 52.8 mL/min (by C-G formula based on SCr of 0.99 mg/dL).   Assessment:  77 yom on warfarin PTA for afib presenting with back, LOC. Pharmacy consulted to dose warfarin inpatient. No active bleed issues reported. Noted, patient on carbamazepine PTA (continued inpatient).   INR therapeutic (2.1) on admit and remains therapeutic (2.4) on home regimen.   PTA regimen: 7.5 mg MWF-Sunday and 11.25 mg TTS.   Evaluated by Neurosurgery, no surgery or injection planned.  Goal of Therapy:  INR 2-3 Monitor platelets by anticoagulation protocol: Yes   Plan:  Continue PTA warfarin regimen of 7.5 mg MWF-Sun (due today) and 11.25 mg TTS. Daily PT/INR for now, but if not discharged and INR stable 4/23, will decrease frequency. Monitor for s/sx bleeding.  Arty Baumgartner, [place] 07/11/2020,3:01 PM

## 2020-07-11 NOTE — TOC Transition Note (Addendum)
Transition of Care [facility]) - CM/SW Discharge Note   Patient Details  Name: [name] MRN: [id number] Date of Birth: Aug 07, 1933  [facility] CM/SW Contact:  Zenon Mayo, RN Phone Number: 07/11/2020, 12:07 PM   Clinical Narrative:    NCM spoke with patient, offered choice he has no preference,.  Patient does not want outpatient pt. Due to tranportation.  Patient is actually active with [facility] for RN, PT, OT, he did not remember, he states he would like to continue with [facility]. NCM notified Ramond Marrow. He is for poss dc today.  Will add HHAIDE and SW to services.   Final next level of care: [place] Barriers to Discharge: No Barriers Identified   Patient Goals and CMS Choice Patient states their goals for this hospitalization and ongoing recovery are:: return home CMS Medicare.gov Compare Post Acute Care list provided to:: Patient Choice offered to / list presented to : Patient  Discharge Placement                       Discharge Plan and Services                  DME Agency: NA       HH Arranged: RN,PT,OT,AIDE, Social Worker [place] Agency: [place] ([facility]) Date HH Agency Contacted: 07/11/20 Time [place]: 1207 Representative spoke with at [place]: Miguel Barrera ([place]) Interventions     Readmission Risk Interventions Readmission Risk Prevention Plan 08/21/2018  Transportation Screening Complete  PCP or Specialist Appt within 3-5 Days Complete  HRI or [place] Patient refused  Social Work Consult for [place] Planning/Counseling Complete  Palliative Care Screening Not Applicable  Medication Review Press photographer) Complete  Some recent data might be hidden

## 2020-07-11 NOTE — Plan of Care (Signed)
  Problem: Education: Goal: Knowledge of General Education information will improve Description: Including pain rating scale, medication(s)/side effects and non-pharmacologic comfort measures Outcome: Adequate for Discharge   

## 2020-07-11 NOTE — Progress Notes (Addendum)
Progress Note  Patient Name: [name] Date of Encounter: 07/11/2020  Primary Cardiologist: Minus Breeding, MD  Subjective   Feeling well from cardiac standpoint without any dizziness, chest pain, SOB, headache. Denies back pain. States, "I feel pretty good."  Inpatient Medications    Scheduled Meds: . atorvastatin  80 mg Oral QPM  . carbamazepine  300 mg Oral BID  . cloNIDine  0.2 mg Oral BID  . escitalopram  5 mg Oral QHS  . insulin aspart  0-15 Units Subcutaneous TID WC  . insulin aspart  0-5 Units Subcutaneous QHS  . isosorbide mononitrate  60 mg Oral Daily  . levETIRAcetam  250 mg Oral BID  . levothyroxine  75 mcg Oral QAC breakfast  . lidocaine  1 patch Transdermal Q24H  . losartan  50 mg Oral Daily  . sodium chloride flush  3 mL Intravenous Q12H  . tamsulosin  0.4 mg Oral QPC supper  . warfarin  7.5 mg Oral Q M,W,F,Su-1800   And  . warfarin  11.25 mg Oral Q T,Th,Sat-1800  . Warfarin - Pharmacist Dosing Inpatient   Does not apply q1600   Continuous Infusions:  PRN Meds: acetaminophen **OR** acetaminophen, albuterol, hydrALAZINE, HYDROcodone-acetaminophen, ondansetron **OR** ondansetron (ZOFRAN) IV   Vital Signs    Vitals:   07/10/20 1708 07/10/20 2000 07/11/20 0406 07/11/20 0745  BP: 137/61 (!) 109/51 134/65 (!) 151/68  Pulse: (!) 57 66 60 62  Resp:  16 16 16   Temp:  97.9 F (36.6 C) 98.3 F (36.8 C) 98.9 F (37.2 C)  TempSrc:  Axillary Oral Oral  SpO2:  100% 96% 96%  Weight:   77.8 kg   Height:        Intake/Output Summary (Last 24 hours) at 07/11/2020 0827 Last data filed at 07/11/2020 0500 Gross per 24 hour  Intake 990 ml  Output 950 ml  Net 40 ml   Last 3 Weights 07/11/2020 07/10/2020 07/09/2020  Weight (lbs) 171 lb 8.3 oz 174 lb 6.1 oz 178 lb 12.7 oz  Weight (kg) 77.8 kg 79.1 kg 81.1 kg     Telemetry    NSR, occasional PVCs. No further bradycardia - Personally Reviewed  Physical Exam   GEN: No acute distress.  HEENT:  Normocephalic, atraumatic, sclera non-icteric. Neck: No JVD or bruits. Cardiac: RRR no murmurs, rubs, or gallops.  Respiratory: Clear to auscultation bilaterally. Breathing is unlabored. GI: Soft, nontender, non-distended, BS +x 4. MS: no deformity. Extremities: No clubbing or cyanosis. No edema. Distal pedal pulses are 2+ and equal bilaterally. Neuro:  AAOx3. Follows commands. Psych:  Responds to questions appropriately with a normal affect.  Labs    High Sensitivity Troponin:   Recent Labs  Lab 06/29/20 2100 06/29/20 2304 07/08/20 0146 07/08/20 0346  TROPONINIHS 37* 34* 14 15      Cardiac EnzymesNo results for input(s): TROPONINI in the last 168 hours. No results for input(s): TROPIPOC in the last 168 hours.   Chemistry Recent Labs  Lab 07/07/20 2241 07/09/20 0442 07/10/20 0443 07/11/20 0415  NA 135 135 137 135  K 4.0 4.4 4.0 4.1  CL 103 101 103 100  CO2 29 29 29 27   GLUCOSE 124* 109* 103* 104*  BUN 23 18 15 21   CREATININE 1.08 1.06 0.94 0.99  CALCIUM 7.9* 8.5* 8.8* 8.6*  PROT 5.2*  --  5.2*  --   ALBUMIN 3.0*  --  3.0*  --   AST 16  --  16  --  ALT 15  --  15  --   ALKPHOS 62  --  60  --   BILITOT 0.4  --  0.4  --   GFRNONAA >60 >60 >60 >60  ANIONGAP 3* 5 5 8      Hematology Recent Labs  Lab 07/09/20 0442 07/10/20 0443 07/11/20 0415  WBC 8.2 6.8 7.8  RBC 3.48* 3.61* 3.60*  HGB 11.0* 11.4* 11.4*  HCT 33.3* 34.2* 33.9*  MCV 95.7 94.7 94.2  MCH 31.6 31.6 31.7  MCHC 33.0 33.3 33.6  RDW 15.2 14.8 15.0  PLT 300 303 310    BNPNo results for input(s): BNP, PROBNP in the last 168 hours.   DDimer No results for input(s): DDIMER in the last 168 hours.   Radiology    EEG adult  Result Date: 07/09/2020 Lora Havens, MD     07/09/2020  8:53 AM Patient Name: [name] MRN: [id number] Epilepsy Attending: Lora Havens Referring Physician/Provider: Dr. Karmen Bongo Date: 07/08/2020 Duration: 24.15 mins Patient history: 87 year old male with  prior history of stroke as well as seizure presented with transient episode of alteration of awareness.  EEG to evaluate for seizures. Level of alertness: Awake AEDs during EEG study: Carbamazepine, Keppra Technical aspects: This EEG study was done with scalp electrodes positioned according to the 10-20 International system of electrode placement. Electrical activity was acquired at a sampling rate of 500Hz  and reviewed with a high frequency filter of 70Hz  and a low frequency filter of 1Hz . EEG data were recorded continuously and digitally stored. Description: The posterior dominant rhythm consists of 8 Hz activity of moderate voltage (25-35 uV) seen predominantly in posterior head regions, symmetric and reactive to eye opening and eye closing. EEG showed intermittent generalized 3 to 6 Hz theta-delta slowing. Physiologic photic driving was not seen during photic stimulation.  Hyperventilation was not performed.   ABNORMALITY - Intermittent slow, generalized IMPRESSION: This study is suggestive of mild diffuse encephalopathy, nonspecific etiology. No seizures or epileptiform discharges were seen throughout the recording. Lora Havens    Cardiac Studies    Left heart catheterization 2014: Final Conclusions: Patent RCA stent. There was 40-50% proximal LAD stenosis at D1 with 70-80% ostial D1 stenosis. D1 disease looks like it could potentially cause exertional chest pain, but would be surprised if this could have caused the 2 hours of chest pain at rest he had yesterday. I reviewed with Dr. [place]: intervention would be difficult given the disease in the LAD at D1 take-off, could potentially perform balloon angioplasty. Will plan on medical management. Will add Imdur to regimen. If he has further chest pain, could re-visit intervention on D1.  2D echo 07/08/20  1. Left ventricular ejection fraction, by estimation, is 50 to 55%. The  left ventricle has low normal function. The left ventricle has  no regional  wall motion abnormalities. There is mild left ventricular hypertrophy.  Left ventricular diastolic  parameters are consistent with Grade I diastolic dysfunction (impaired  relaxation). Elevated left atrial pressure.  2. Right ventricular systolic function is normal. The right ventricular  size is normal. There is mildly elevated pulmonary artery systolic  pressure.  3. Left atrial size was severely dilated.  4. The mitral valve is normal in structure. Trivial mitral valve  regurgitation. No evidence of mitral stenosis. Moderate mitral annular  calcification.  5. The aortic valve is tricuspid. There is mild calcification of the  aortic valve. There is moderate thickening of the aortic valve. Aortic  valve  regurgitation is not visualized. Mild to moderate aortic valve  sclerosis/calcification is present, without  any evidence of aortic stenosis.  6. There is mild dilatation of the ascending aorta, measuring 39 mm.   Patient Profile     87 y.o. male CAD s/p PCI/DES to RCA in [id number], chronic diastolic CHF, paroxysmal atrial fibrillation/flutter, carotid artery disease, HTN, HLD, DM type 2, CVA, seizure disorder, and hypothyroidism. Beta blocker previously discontinued for bradycardia/pauses then resumed in subsequent phone notes 03/2020 for unclear reasons. Recently admitted 4/10-4/12 with generalized weakness and back pain. MRI imaging of his L spine which showed inflammation in the L1-2 disc and medial left psoas without abscess. Inflammatory markers were wnl and remainder of work up was benign. On the evening of 07/07/20 he had an episode of unresponsiveness while eating dinner. Telemetry initially was benign then he began having high grade AVB and possible transient CHB therefore metoprolol discontinued. Cardiology following.   Also being followed by IM/neurosurgery for back pain with persistent weakness, difficulty ambulating, urinary incontinence.  Assessment & Plan    1.  Syncope with high grade AV block on telemetry - evidence of high grade AVB noted on telemetry this admission which could have been the cause - beta blocker discontinued. Last bradycardia occurred 4/20 - no recurrent episodes since then - CT head without acute abnormality - 2D echo EF 50-55%, mild LVH, grade 1 DD, mildly elevated PASP, severe LAE, mild dilation of ascending aorta - also has h/o seizure disorder - EEG reported to have been without seizure/epileptiform activity - remains cautiously on clonidine at this time but need to consider weaning/discontinuing if bradycardia recurs - no evidence for PPM at present time but will need close OP f/u as well - await input from MD on final recs  2. CAD s/p PCI 2004 - stable, no angina, negative troponins - BB on hold - continue statin, Imdur - not on ASA PTA due to concomitant [place]  3. H/o paroxysmal atrial fib/flutter - Hgb appears stable compared to prior - on warfarin PTA- does not look like we manage INR - patient states [name] does but the last OV I see addressing this was 12/2019 at which time there was some discussion of changing to Xarelto instead as the patient frequently forgot to come in for Coumadin checks. Will review with Dr. Percival Spanish. Per Epic has f/u with [name] on 4/26 - NSR on telemetry - holding BB as above  4. Chronic diastolic CHF - appears euvolemic - on low dose Lasix as OP, can consider resuming at DC  5. Essential HTN - current rx: clonidine 0.2mg  BID, Imdur 60mg  daily, losartan 50mg  daily, tamsulosin 0.4mg  qsupper - most recent BP range 100/50 through 151/68 so would not escalate dosing at present time - avoid BB  6. Mild dilation of ascending aorta (52mm) - f/u as OP  Remainder of medical problems including abnormal TSH/back problems per primary team.  Per EMR has f/u with [name] on 4/26 and f/u Dr. Percival Spanish 5/6.   For questions or updates, please contact [place] Please consult  [email or web address] for contact info under Cardiology/STEMI.  Signed, Charlie Pitter, PA-C 07/11/2020, 8:27 AM     History and all data above reviewed.  Patient examined.  I agree with the findings as above.   I had a long discussion with his daughter.  They are very worried about his decline over the past months and the he has had in particular gait and fall issues.  The patient exam reveals COR:RRR  ,  Lungs: Clear  ,  Abd: Positive bowel sounds, no rebound no guarding, Ext No edema  .  All available labs, radiology testing, previous records reviewed. Agree with documented assessment and plan.   Syncope:  Etiology is not clear.  No indication for pacing at this point. I would continue warfarin per his primary MD and emphasize the need for follow up.  Needs home 4 week event monitor and we will arrange that.   Jeneen Rinks [facility]  9:34 AM  07/11/2020

## 2020-07-11 NOTE — Progress Notes (Addendum)
Occupational Therapy Treatment Patient Details Name: [name] MRN: [id number] DOB: [birth year] Today's Date: 07/11/2020    History of present illness The pt is an 87 yo male presenting 4/18 with "low HR" and "slower than normal responses" as well as some dizziness. Imaging revealed no acute abnormalities, significant multilevel spondylitic disease with significant stenosis particularly at L2-3 but also at L1-2, L3-4 and L4-5.  PMH includes: recent admission for progressive LE weakness and back pain, a flutter, BPH, CAD s/p MI and stenting, DM , HLD, HTN, meniere's disease, RTC repair, seizures, CVA with residual R hand weakness, HF.   OT comments  Patient with nice progress toward patient focused OT goals.  He was able to bathe and dress himself with setup and supervision from a sit to stand level.  His biggest complaint is lower body weakness, therefore he is needing close supervision to [facility] for stand balance and tolerance during ADL.  He tends to lean against the sink to stabilize himself.  He is up and moving in the room at [facility] level with supervision, but only for short distances at RW level.  The patient prefers to go home with [facility], but given his deficits and level of assist needed during the days, a short stay at a SNF would be a better option for him.  Family are unable to assist him during the day due to work schedules, and his wife has cognitive dysfunction, and is not a reliable caregiver.  Acute OT will follow him.    Follow Up Recommendations  Home health OT per patient, SNF is a better option.   Equipment Recommendations  None recommended by OT    Recommendations for Other Services      Precautions / Restrictions Precautions Precautions: Fall Restrictions Weight Bearing Restrictions: No       Mobility Bed Mobility Overal bed mobility: Needs Assistance Bed Mobility: Rolling;Supine to Sit Rolling: Supervision Sidelying to sit: Supervision             Transfers Overall transfer level: Needs assistance Equipment used: Rolling walker (2 wheeled) Transfers: Sit to/from Stand Sit to Stand: Supervision              Balance Overall balance assessment: Needs assistance Sitting-balance support: Feet supported Sitting balance-Leahy Scale: Good     Standing balance support: Bilateral upper extremity supported Standing balance-Leahy Scale: Poor Standing balance comment: reliant on BUE support for gait                           ADL either performed or assessed with clinical judgement   ADL       Grooming: Wash/dry face;Wash/dry hands;Standing;Supervision/safety;Sitting   Upper Body Bathing: Supervision/ safety;Sitting;Standing   Lower Body Bathing: Supervison/ safety;Sit to/from stand.  Starts to lean against the sink after a few minutes to support himself.     Upper Body Dressing : Set up;Sitting   Lower Body Dressing: Supervision/safety;Sit to/from stand   Toilet Transfer: Supervision/safety;RW   Toileting- Water quality scientist and Hygiene: Supervision/safety;Sit to/from stand       Functional mobility during ADLs: Supervision/safety;Rolling walker  Pertinent Vitals/ Pain       Pain Assessment: No/denies pain                                                          Frequency  Min 2X/week        Progress Toward Goals  OT Goals(current goals can now be found in the care plan section)  Progress towards OT goals: Progressing toward goals  Acute Rehab OT Goals Patient Stated Goal: go home, get my legs stronger OT Goal Formulation: With patient Time For Goal Achievement: 07/23/20 Potential to Achieve Goals: Good  Plan Discharge plan remains appropriate    Co-evaluation                 AM-PAC OT "6 Clicks" Daily Activity     Outcome  Measure   Help from another person eating meals?: None Help from another person taking care of personal grooming?: None Help from another person toileting, which includes using toliet, bedpan, or urinal?: None Help from another person bathing (including washing, rinsing, drying)?: A Little Help from another person to put on and taking off regular upper body clothing?: None Help from another person to put on and taking off regular lower body clothing?: A Little 6 Click Score: 22    End of Session Equipment Utilized During Treatment: Rolling walker  OT Visit Diagnosis: Unsteadiness on feet (R26.81);Muscle weakness (generalized) (M62.81)   Activity Tolerance Patient tolerated treatment well   Patient Left in chair;with call bell/phone within reach;with chair alarm set   Nurse Communication          Time: 1030-1102 OT Time Calculation (min): 32 min  Charges: OT General Charges $OT Visit: 1 Visit OT Treatments $Self Care/Home Management : 23-37 mins  07/11/2020  Rich, OTR/L  Acute Rehabilitation Services  Office:  [phone]    Metta Clines 07/11/2020, 11:09 AM

## 2020-07-11 NOTE — Progress Notes (Signed)
D/C instructions given and reviewed. Tele removed, tolerated well. Primary RN administering last IV antibiotic. Family will arrive for transport in about an hour.

## 2020-07-11 NOTE — TOC Progression Note (Signed)
Transition of Care [facility]) - Progression Note    Patient Details  Name: [name] MRN: [id number] Date of Birth: Dec 11, 1933  Transition of Care [facility]) CM/SW [place], [place] Phone Number: 07/11/2020, 12:10 PM  Clinical Narrative:     CSW notified by OT that family wanting SNF. CSW spoke with pt who explained he does not want SNF. He consented to CSW discussing with his daughter. CSW called pt daughter who confirmed that though she wanted him to do SNF, he was not agreeable and so they will plan for home with [facility]. Daughter can transport home but wouldn't be able to until later in the afternoon. She would like notification if/when pt is discharging.     Barriers to Discharge: No Barriers Identified  Expected Discharge Plan and Services                             DME Agency: NA       HH Arranged: RN,PT,OT [place] Agency: [place] ([facility]) Date [place]: 07/11/20 Time [place]: 1207 Representative spoke with at [place]: [place] ([place]) Interventions    Readmission Risk Interventions Readmission Risk Prevention Plan 08/21/2018  Transportation Screening Complete  PCP or Specialist Appt within 3-5 Days Complete  HRI or Camp Patient refused  Social Work Consult for [place] Planning/Counseling Complete  Palliative Care Screening Not Applicable  Medication Review Press photographer) Complete  Some recent data might be hidden

## 2020-07-12 ENCOUNTER — Other Ambulatory Visit: Payer: Self-pay | Admitting: Family Medicine

## 2020-07-12 DIAGNOSIS — R55 Syncope and collapse: Secondary | ICD-10-CM | POA: Diagnosis not present

## 2020-07-12 DIAGNOSIS — E785 Hyperlipidemia, unspecified: Secondary | ICD-10-CM

## 2020-07-12 DIAGNOSIS — I441 Atrioventricular block, second degree: Secondary | ICD-10-CM | POA: Diagnosis not present

## 2020-07-13 ENCOUNTER — Telehealth: Payer: Self-pay | Admitting: Student

## 2020-07-13 NOTE — Telephone Encounter (Addendum)
   Received page from [facility] about abnormal EKG. Called and spoke with staff who reported one episode of "ventricular asystole possibly secondary to high grade AV block" lasting for 4.5 seconds around 2:12pm. Called and spoke with patient. He is completely asymptomatic - no chest pain, shortness of breath, lightheadedness, dizziness, near syncope/syncope. I asked patient what he was doing around that time and he states he could have been sleeping or eating. However, he was asymptomatic. Patient is not on any AV nodal agents. No changes necessary at this time. Will wait for full monitor results. Advised patient that if he develops any of the above symptoms he should notify us or go to the ED. He voice understanding and agreed.  After contacting patient, I did received fax EKG strip. Does shows one 4.5 second pause. Hard to distinguish rhythm prior to pause based on faxed strip to determine if high-grade AV block preceded the pause. However, given this was an isolated pause and patient asymptomatic, continue with plan above.  Will route note to Dr. Percival Spanish so he is aware.  Darreld Mclean, PA-C 07/13/2020 6:10 PM

## 2020-07-14 ENCOUNTER — Other Ambulatory Visit: Payer: Self-pay

## 2020-07-14 ENCOUNTER — Telehealth: Payer: Self-pay | Admitting: *Deleted

## 2020-07-14 ENCOUNTER — Telehealth: Payer: Self-pay | Admitting: Family Medicine

## 2020-07-14 DIAGNOSIS — I480 Paroxysmal atrial fibrillation: Secondary | ICD-10-CM | POA: Diagnosis not present

## 2020-07-14 DIAGNOSIS — I5032 Chronic diastolic (congestive) heart failure: Secondary | ICD-10-CM

## 2020-07-14 DIAGNOSIS — M48061 Spinal stenosis, lumbar region without neurogenic claudication: Secondary | ICD-10-CM | POA: Diagnosis not present

## 2020-07-14 DIAGNOSIS — E119 Type 2 diabetes mellitus without complications: Secondary | ICD-10-CM | POA: Diagnosis not present

## 2020-07-14 DIAGNOSIS — E039 Hypothyroidism, unspecified: Secondary | ICD-10-CM | POA: Diagnosis not present

## 2020-07-14 DIAGNOSIS — E118 Type 2 diabetes mellitus with unspecified complications: Secondary | ICD-10-CM

## 2020-07-14 DIAGNOSIS — I252 Old myocardial infarction: Secondary | ICD-10-CM | POA: Diagnosis not present

## 2020-07-14 DIAGNOSIS — I69331 Monoplegia of upper limb following cerebral infarction affecting right dominant side: Secondary | ICD-10-CM | POA: Diagnosis not present

## 2020-07-14 DIAGNOSIS — M5116 Intervertebral disc disorders with radiculopathy, lumbar region: Secondary | ICD-10-CM | POA: Diagnosis not present

## 2020-07-14 DIAGNOSIS — I4892 Unspecified atrial flutter: Secondary | ICD-10-CM | POA: Diagnosis not present

## 2020-07-14 DIAGNOSIS — I110 Hypertensive heart disease with heart failure: Secondary | ICD-10-CM | POA: Diagnosis not present

## 2020-07-14 DIAGNOSIS — I2510 Atherosclerotic heart disease of native coronary artery without angina pectoris: Secondary | ICD-10-CM | POA: Diagnosis not present

## 2020-07-14 NOTE — Telephone Encounter (Signed)
Transition Care Management Follow-up Telephone Call  Date of discharge and from where: 07/11/2020 from [facility]   How have you been since you were released from the hospital? Patient states that he is doing fine   Any questions or concerns? No  Items Reviewed:  Did the pt receive and understand the discharge instructions provided? Yes   Medications obtained and verified? Yes   Other? No   Any new allergies since your discharge? No   Dietary orders reviewed? Yes  Do you have support at home? Yes   Home Care and Equipment/Supplies: Were home health services ordered? yes If so, what is the name of the agency? [facility]  Has the agency set up a time to come to the patient's home? no Were any new equipment or medical supplies ordered?  No What is the name of the medical supply agency? N/A Were you able to get the supplies/equipment? not applicable Do you have any questions related to the use of the equipment or supplies? No  Functional Questionnaire: (I = Independent and D = Dependent) ADLs: D  Bathing/Dressing- I with assistance   Meal Prep- D  Eating- I   Maintaining continence- I  Transferring/Ambulation- I with walker   Managing Meds- D   Follow up appointments reviewed:   PCP Hospital f/u appt confirmed? Yes  Scheduled to see [name] on 07/15/2020 @ 3:00 PM.  Specialist Hospital f/u appt confirmed? Yes  Scheduled to see Dr. Percival Spanish on 07/25/2020 @ 4:00 PM.  Are transportation arrangements needed? No   If their condition worsens, is the pt aware to call PCP or go to the Emergency Dept.? Yes  Was the patient provided with contact information for the PCP's office or ED? Yes  Was to pt encouraged to call back with questions or concerns? Yes

## 2020-07-14 NOTE — Telephone Encounter (Signed)
Received call from [place] [facility] with [place] [phone]~ telephone.   Requested VO to resume care 2x weekly x2 weeks for CHF education and medication management.   VO given.

## 2020-07-15 ENCOUNTER — Encounter: Payer: Self-pay | Admitting: Family Medicine

## 2020-07-15 ENCOUNTER — Ambulatory Visit (INDEPENDENT_AMBULATORY_CARE_PROVIDER_SITE_OTHER): Payer: Medicare Other | Admitting: Family Medicine

## 2020-07-15 ENCOUNTER — Other Ambulatory Visit: Payer: Self-pay

## 2020-07-15 ENCOUNTER — Telehealth: Payer: Self-pay | Admitting: *Deleted

## 2020-07-15 VITALS — BP 132/70 | HR 72 | Temp 98.1°F | Resp 14 | Ht 68.0 in | Wt 179.0 lb

## 2020-07-15 DIAGNOSIS — I10 Essential (primary) hypertension: Secondary | ICD-10-CM | POA: Diagnosis not present

## 2020-07-15 DIAGNOSIS — Z7901 Long term (current) use of anticoagulants: Secondary | ICD-10-CM | POA: Diagnosis not present

## 2020-07-15 DIAGNOSIS — M5116 Intervertebral disc disorders with radiculopathy, lumbar region: Secondary | ICD-10-CM | POA: Diagnosis not present

## 2020-07-15 DIAGNOSIS — I480 Paroxysmal atrial fibrillation: Secondary | ICD-10-CM

## 2020-07-15 DIAGNOSIS — R29898 Other symptoms and signs involving the musculoskeletal system: Secondary | ICD-10-CM | POA: Diagnosis not present

## 2020-07-15 DIAGNOSIS — E039 Hypothyroidism, unspecified: Secondary | ICD-10-CM | POA: Diagnosis not present

## 2020-07-15 DIAGNOSIS — R55 Syncope and collapse: Secondary | ICD-10-CM

## 2020-07-15 DIAGNOSIS — R300 Dysuria: Secondary | ICD-10-CM | POA: Diagnosis not present

## 2020-07-15 LAB — PT WITH INR/FINGERSTICK
INR, fingerstick: 2.4 ratio — ABNORMAL HIGH
PT, fingerstick: 29.2 s — ABNORMAL HIGH (ref 10.5–13.1)

## 2020-07-15 LAB — CBC WITH DIFFERENTIAL/PLATELET
Absolute Monocytes: 684 cells/uL (ref 200–950)
Basophils Absolute: 80 cells/uL (ref 0–200)
Basophils Relative: 1.4 %
Eosinophils Absolute: 302 cells/uL (ref 15–500)
Eosinophils Relative: 5.3 %
HCT: 34.8 % — ABNORMAL LOW (ref 38.5–50.0)
Hemoglobin: 11.6 g/dL — ABNORMAL LOW (ref 13.2–17.1)
Lymphs Abs: 1744 cells/uL (ref 850–3900)
MCH: 31.7 pg (ref 27.0–33.0)
MCHC: 33.3 g/dL (ref 32.0–36.0)
MCV: 95.1 fL (ref 80.0–100.0)
MPV: 10.9 fL (ref 7.5–12.5)
Monocytes Relative: 12 %
Neutro Abs: 2890 cells/uL (ref 1500–7800)
Neutrophils Relative %: 50.7 %
Platelets: 306 10*3/uL (ref 140–400)
RBC: 3.66 10*6/uL — ABNORMAL LOW (ref 4.20–5.80)
RDW: 13.4 % (ref 11.0–15.0)
Total Lymphocyte: 30.6 %
WBC: 5.7 10*3/uL (ref 3.8–10.8)

## 2020-07-15 LAB — COMPLETE METABOLIC PANEL WITH GFR
AG Ratio: 2 (calc) (ref 1.0–2.5)
ALT: 17 U/L (ref 9–46)
AST: 20 U/L (ref 10–35)
Albumin: 4.2 g/dL (ref 3.6–5.1)
Alkaline phosphatase (APISO): 71 U/L (ref 35–144)
BUN: 23 mg/dL (ref 7–25)
CO2: 28 mmol/L (ref 20–32)
Calcium: 9.4 mg/dL (ref 8.6–10.3)
Chloride: 99 mmol/L (ref 98–110)
Creat: 1.04 mg/dL (ref 0.70–1.11)
GFR, Est African American: 76 mL/min/{1.73_m2} (ref >=60)
GFR, Est Non African American: 65 mL/min/{1.73_m2} (ref >=60)
Globulin: 2.1 g/dL (calc) (ref 1.9–3.7)
Glucose, Bld: 104 mg/dL — ABNORMAL HIGH (ref 65–99)
Potassium: 4.8 mmol/L (ref 3.5–5.3)
Sodium: 136 mmol/L (ref 135–146)
Total Bilirubin: 0.4 mg/dL (ref 0.2–1.2)
Total Protein: 6.3 g/dL (ref 6.1–8.1)

## 2020-07-15 LAB — TSH: TSH: 3.41 mIU/L (ref 0.40–4.50)

## 2020-07-15 NOTE — Telephone Encounter (Signed)
Patient's A1c is still excellent at 6.3 off the metformin so he does not require it

## 2020-07-15 NOTE — Chronic Care Management (AMB) (Signed)
  Chronic Care Management   Note  07/15/2020 Name: [name] MRN: [id number] DOB: 1933-11-27  [name] is a 87 y.o. year old male who is a primary care patient of Pickard, Cammie Mcgee, MD. I reached out to [place] by phone today in response to a referral sent by [name]'s PCP, [name].     [name] was given information about Chronic Care Management services today including:  1. CCM service includes personalized support from designated clinical staff supervised by his physician, including individualized plan of care and coordination with other care providers 2. 24/7 contact phone numbers for assistance for urgent and routine care needs. 3. Service will only be billed when office clinical staff spend 20 minutes or more in a month to coordinate care. 4. Only one practitioner may furnish and bill the service in a calendar month. 5. The patient may stop CCM services at any time (effective at the end of the month) by phone call to the office staff. 6. The patient will be responsible for cost sharing (co-pay) of up to 20% of the service fee (after annual deductible is met).  Patient did not agree to enrollment in care management services and does not wish to consider at this time.  Follow up plan: Patient declines  engagement by the care management team. Appropriate care team members and provider have been notified via electronic communication. The care management team is available to follow up with the patient after provider conversation with the patient regarding recommendation for care management engagement and subsequent re-referral to the care management team.   [place] Management

## 2020-07-15 NOTE — Progress Notes (Signed)
Subjective:    Patient ID: [name], male    DOB: [birth year], 87 y.o.   MRN: [id number]  Recently admitted to the hospital: History of present illness:  [name] a 87 y.o.malewith medical history significant ofafibon Coumadin; CVA; seizure d/o; CAD; hypothyroidism; HTN; HLD; and DM presenting with syncope.They left a week or two ago for a trip to the [place] with family. He was doing well when he left home but that day he noticed difficulty walking. He was having back pain. He thinks he was weak in both legs. They stayed a few days and he just laid around and it got worse. They came home and he was so "demobilized." He was admitted from 4/10-12 "and that's where I've been ever since... I think I was in the hospital and came back home and it didn't get no better so they brought me back in." He didn't get any better at home.   He was admitted for a 35-40 second spell, like passing out, while he was seated.   In review of his last admission, he presented to [facility] on 4/11 for B LE weakness and back pain. He was transferred to [facility] for MRI. CRP and ESR were negative. HTN medications were adjusted (lowered) but to hypotension. He was started on Flomax for his urinary symptoms. MRI brain was without acute problems and there was mild cervical stenosis noted. MRI of the L spine showed inflammatory signal in L1-2 thought to be acute on chronic disc/endplate degeneration with moderate to severe spinal stenosis.   He was admitted with presyncopal episode, low back pain and weakness.  He was seen by neurosurgery in house who did not recommend any surgical interventions at this time.  Hospitalization complicated by high grade AV block, which could be underlying cause for his presyncopal episode.  Metoprolol was discontinued.  Cardiology was consulted.  See below for additional details.  Discharged home in stable condition 4/22.   Hospital Course:  Syncope -Patient with  prior h/o stroke as well as known seizure d/o who presented with transient (<1 minute) episode of altered sensorium/unresponsiveness this AM - this occurred while he was seated ->tele notable for brady events here (high grade AV block). He was on metoprolol, will hold this.  - cardiology c/s, appreciate recs -> discontinued metoprolol.  Discharge home with event monitor.   -EEG without seizure/eplileptiform activity -continue tele monitoring  -negative orthostatics -Troponinsnegative x 2 -Head CT negative -2d echoordered -> EF 64-40%, grade 1 diastolic dysfunction (see report) -Neuro checks  -PT/OT eval and treat  Bradycardia  - tele with pauses ->dropped beats, concerning for mobitz 2/high grade AV block? - hold metoprolol  - also on clonidine, which could contribute, though metop more obvious contributor  - cardiology consulted in setting of concern for high grade AV block/syncope above, appreciate assistance-> metoprolol discontinued, ziopatch placed  Back pain -Recent MRI with apparent severe lumbar spinal stenosis - repeat MRI with unchanged appearance of hyperintense T2 weighted signal within the endplates at H4-7, no contrast enhancement in the disc space. Unchanged multilevel severe spondylosis with spinal canal stenosis worst at L2-3 and L4-5.  -Persistent weakness, difficulty ambulating, urinary incontinence -Neurosurgery consult requested, Dr. Annette Stable to see -> pain control and mobilization with therapy, follow - no neurosurgical intervention recommended - follow outpatient  -Continue Lidoderm, Norco for pain  Urinary incontinence  Urinary Frequency -Daughter reports significant issue with incontinence which is not baseline - this is new over past several weeks -Recently  started on Flomax without improvement -discussed question of NPH with neurosurgery - will follow at this point- no recommendation for intervention at this point follow outpatient  - follow urine  culture, UA borderline, not clearly indicative of UTI, but in setting of new urinary frequncy, certainly could fit -> discussed empiric abx with daughter, risks/benefits -> will treat empirically, needs to follow final cx with PCP  Hematuria - has planned follow up with urology for above, can follow this as well, likely related to coumadin - follow outpatient - could also be related to UTI, follow culture  Afib -Rate controlled -Continue Coumadin - follow INR with PCP outpatient   Seizure d/o -EEG, as above -Continue Carbamazepine and Keppra  Hypothyroidism -ElevatedTSH -Continue Synthroid at current dose for nowbut needs outpatient PCP f/u  HTN -Continue Clonidine, Imdur, Cozaar. Lopressor on hold - follow BP for need for adjustment  HLD -Continue Lipitor  DM -Recent A1c 6.3 indicates good control -hold Glucophage -Cover with moderate-scale SSI  Procedures: Echo IMPRESSIONS    1. Left ventricular ejection fraction, by estimation, is 50 to 55%. The  left ventricle has low normal function. The left ventricle has no regional  wall motion abnormalities. There is mild left ventricular hypertrophy.  Left ventricular diastolic  parameters are consistent with Grade I diastolic dysfunction (impaired  relaxation). Elevated left atrial pressure.  2. Right ventricular systolic function is normal. The right ventricular  size is normal. There is mildly elevated pulmonary artery systolic  pressure.  3. Left atrial size was severely dilated.  4. The mitral valve is normal in structure. Trivial mitral valve  regurgitation. No evidence of mitral stenosis. Moderate mitral annular  calcification.  5. The aortic valve is tricuspid. There is mild calcification of the  aortic valve. There is moderate thickening of the aortic valve. Aortic  valve regurgitation is not visualized. Mild to moderate aortic valve  sclerosis/calcification is present, without  any evidence of  aortic stenosis.  6. There is mild dilatation of the ascending aorta, measuring 39 mm.   EEG IMPRESSION: This study is suggestive of mild diffuse encephalopathy, nonspecific etiology.No seizures or epileptiform discharges were seen throughout the recording.  Consultations:  Cardiology   neurosurgery   07/15/20 Patient is here today for follow-up.  He is here today alone.  He is not certain exactly what happened in the hospital.  Patient has age-related cognitive decline and dementia.  His short-term memory is compromised.  He does not recollect the events of the hospitalization.  Today he states that he is not hurting in his back.  I palpated his spinous processes around L1 and L2 and I am unable to elicit any pain.  He primarily complains of pain in his right leg specifically around his right knee.  He states that whenever he stands up or sits down he has pain in his right knee however I do not believe that this has any relationship to any lumbar radiculopathy.  Instead I believe this is most likely due to osteoarthritis of the right knee.  He is walking using a walker.  He is still taking Coumadin.  He is on 7.5 mg tablets, Tuesday Thursday and Saturday.  He takes 5 mg tablets on Monday, Wednesday, Friday, and Sunday.  INR today is therapeutic at 2.4.  He has not yet called to make an appointment with a back surgeon.  In fact he does not even report having any back pain today.  He states that he is not taking metoprolol.  He denies any syncope or near syncope or chest pain or palpitations.  He is on antibiotics for urinary tract infection.  He denies any dysuria or urgency or frequency. Past Medical History:  Diagnosis Date  . Atrial flutter ([place])   . BPH (benign prostatic hyperplasia)   . Coronary artery disease    a. s/p MI tx with Cypher DES to pRCA in 4/04;  b. Echocardiogram 7/09: Normal LV function.  c. Nuclear study 3/13 no ischemia;  d. ETT 2/14 neg;  e. admx with CP => LHC  (01/30/2013):  pLAD 40-50%, oD1 70-80%, oOM1 40%, pRCA stent patent, mid RCA 30%, EF 60-65%. => med Rx.  . DDD (degenerative disc disease)   . Diabetes mellitus without complication ([place])   . Dyslipidemia   . Gait disorder 07/13/2018  . Hemorrhoids   . Hx MRSA infection    left buttocks abscess  . Hx of echocardiogram    a. Echocardiogram (01/31/2013): Mild focal basal and mild concentric hypertrophy of the septum, EF 50-55%, normal wall motion, grade 1 diastolic dysfunction, trivial AI, MAC, mild LAE, PASP 35  . Hyperlipidemia   . Hypertension   . Hypothyroidism   . Meniere's disease    Status post shunt  . Myocardial infarction ([place]) 2008  . Occlusion and stenosis of carotid artery without mention of cerebral infarction    40-59% on carotid doppler 2014; [place] (01/2013): R 1-39%, L 60-79%  . Rotator cuff injury    chronic rotator cuff injury status post repair  . Seizure disorder ([place])   . Stroke [facility])    a. 01/2013=> post cardiac cath CVA to L post communicating artery system; R sided weakness  . Syncope    Past Surgical History:  Procedure Laterality Date  . COLONOSCOPY    . CORONARY ANGIOPLASTY WITH STENT PLACEMENT  07/14/2002   Stent to the right coronary artery  . ENDOLYMPHATIC SHUNT DECOMPRESSION  06/11/2009    Right endolymphatic sac decompression and shunt  placement  . HERNIA REPAIR  04/10/2008   scrotal hernia repair  . LEFT HEART CATHETERIZATION WITH CORONARY ANGIOGRAM N/A 01/30/2013   Procedure: LEFT HEART CATHETERIZATION WITH CORONARY ANGIOGRAM;  Surgeon: Larey Dresser, MD;  Location: [facility] CATH LAB;  Service: Cardiovascular;  Laterality: N/A;  . ROTATOR CUFF REPAIR     for chronic rotator cuff injury   Current Outpatient Medications on File Prior to Visit  Medication Sig Dispense Refill  . acetaminophen (TYLENOL) 325 MG tablet Take 1-2 tablets (325-650 mg total) by mouth every 4 (four) hours as needed for mild pain.    Marland Kitchen albuterol (VENTOLIN HFA) 108 (90 Base)  MCG/ACT inhaler Inhale 2 puffs into the lungs every 4 (four) hours as needed for wheezing or shortness of breath. 18 g 11  . atorvastatin (LIPITOR) 80 MG tablet TAKE 1 TABLET BY MOUTH EVERY DAY AT 6 PM 90 tablet 1  . carbamazepine (CARBATROL) 300 MG 12 hr capsule TAKE 1 CAPSULE BY MOUTH TWICE A DAY (Patient taking differently: Take 300 mg by mouth 2 (two) times daily.) 180 capsule 3  . cephALEXin (KEFLEX) 250 MG capsule Take 1 capsule (250 mg total) by mouth 4 (four) times daily for 6 days. 24 capsule 0  . cloNIDine (CATAPRES) 0.2 MG tablet TAKE 1 TABLET(0.2 MG) BY MOUTH TWICE DAILY (Patient taking differently: Take 0.2 mg by mouth 2 (two) times daily.) 180 tablet 3  . escitalopram (LEXAPRO) 5 MG tablet TAKE 1 TABLET BY MOUTH EVERYDAY AT BEDTIME (Patient taking differently:  Take 5 mg by mouth at bedtime.) 90 tablet 1  . furosemide (LASIX) 20 MG tablet Take 0.5 tablets (10 mg total) by mouth daily. 45 tablet 2  . glucose blood test strip 1 each by Other route as needed. Use as instructed 100 each 3  . glucose monitoring kit (FREESTYLE) monitoring kit 1 each by Does not apply route as needed. 1 each 0  . HYDROcodone-acetaminophen (NORCO) 5-325 MG tablet Take 1 tablet by mouth every 6 (six) hours as needed for moderate pain. 15 tablet 0  . isosorbide mononitrate (IMDUR) 60 MG 24 hr tablet Take 0.5 tablets (30 mg total) by mouth daily. (Patient taking differently: Take 60 mg by mouth daily.) 90 tablet 3  . Lancets (ACCU-CHEK SAFE-T PRO) lancets 1 each by Other route as needed. Use as instructed 100 each 3  . levETIRAcetam (KEPPRA) 250 MG tablet Take 1 tablet (250 mg total) by mouth 2 (two) times daily. 180 tablet 1  . levothyroxine (SYNTHROID) 75 MCG tablet TAKE 1 TABLET(75 MCG) BY MOUTH DAILY (Patient taking differently: Take 75 mcg by mouth daily before breakfast.) 90 tablet 0  . losartan (COZAAR) 100 MG tablet Take 0.5 tablets (50 mg total) by mouth daily. 90 tablet 1  . metFORMIN (GLUCOPHAGE) 500 MG  tablet Take 500 mg by mouth 2 (two) times daily with a meal.    . Multiple Vitamins-Minerals (MULTIVITAMINS THER. W/MINERALS) TABS Take 1 tablet by mouth at bedtime.      . tamsulosin (FLOMAX) 0.4 MG CAPS capsule Take 1 capsule (0.4 mg total) by mouth daily after supper. 30 capsule 0  . warfarin (COUMADIN) 7.5 MG tablet TAKE 1&1/2 TABS TUESDAY, THURSDAY & SATURDAY*1 TAB ON SUNDAY, MONDAY, WEDNESDAY & FRIDAY WITH SUPPER (Patient taking differently: Take 7.5-11.25 mg by mouth See admin instructions. 1&1/2 tab Tuesday,Thursday,Saturday. 1 tab Monday,Wednesday,Friday and sunday) 120 tablet 3   No current facility-administered medications on file prior to visit.   Allergies  Allergen Reactions  . Penicillins     Unknown reaction   Social History   Socioeconomic History  . Marital status: Married    Spouse name: Not on file  . Number of children: 4  . Years of education: 12th  . Highest education level: Not on file  Occupational History  . Occupation: Retired  Tobacco Use  . Smoking status: Former Smoker    Years: 5.00    Types: Cigarettes    Quit date: 08/19/1956    Years since quitting: 63.9  . Smokeless tobacco: Former Systems developer    Types: Secondary school teacher  . Vaping Use: Never used  Substance and Sexual Activity  . Alcohol use: No  . Drug use: No  . Sexual activity: Never  Other Topics Concern  . Not on file  Social History Narrative   Lives with wife.   Social Determinants of Health   Financial Resource Strain: Not on file  Food Insecurity: Not on file  Transportation Needs: Not on file  Physical Activity: Not on file  Stress: Not on file  Social Connections: Not on file  Intimate Partner Violence: Not on file     Review of Systems  All other systems reviewed and are negative.      Objective:   Physical Exam Vitals reviewed.  Constitutional:      General: He is not in acute distress.    Appearance: Normal appearance. He is obese. He is not ill-appearing,  toxic-appearing or diaphoretic.  Cardiovascular:     Rate and  Rhythm: Normal rate and regular rhythm.  Pulmonary:     Effort: Pulmonary effort is normal. No respiratory distress.     Breath sounds: Normal breath sounds. No wheezing, rhonchi or rales.  Musculoskeletal:     Right lower leg: No edema.     Left lower leg: No edema.  Neurological:     Mental Status: He is alert.    Patient is in normal sinus rhythm today with no evidence of bradycardia.  He does have some tenderness to palpation around the right knee and pain with flexion and extension of the right knee.  There is no pain with palpation of the spinous processes around his lumbar spine.  He denies any low back pain today.       Assessment & Plan:  Hypothyroidism, unspecified type - Plan: TSH  Paroxysmal atrial fibrillation ([place]) - Plan: PT with INR/Fingerstick  Essential hypertension  Chronic anticoagulation  Weakness of both lower extremities - Plan: CBC with Differential/Platelet, COMPLETE METABOLIC PANEL WITH GFR, TSH, Urine Culture  Lumbar disc disease with radiculopathy  Recurrent syncope - Plan: CBC with Differential/Platelet, COMPLETE METABOLIC PANEL WITH GFR, TSH, Urine Culture  History is difficult due to the patient's memory loss and cognitive decline.  He is here today alone.  TSH was abnormal.  I will repeat the TSH now that the patient is out of the hospital to determine if changes need to be made.  INR is therapeutic.  Continue Coumadin at his current dose and recheck an INR in 4 weeks.  Patient has pain in his right knee.  He has some weakness in his right leg however he is ambulating with a walker.  He declines any pain in his back at the present time and therefore I do not see any need for a surgical referral.  I will repeat a CBC, CMP.  I will also repeat a urine culture as I do not believe the patient has a urinary tract infection.  I believe that his syncope was likely due to metoprolol and bradycardia  secondary to that.  He has an appointment to see his cardiologist in early May.  His family is managing his medications for him.  However the biggest issue I see at the present time is his memory loss.  If the situation continues to deteriorate, he and his wife would benefit from assisted living facility placement and medication supervision

## 2020-07-15 NOTE — Telephone Encounter (Signed)
Received call from patient daughter Maudie Mercury, [phone]~ telephone.  Reports that patient was told by PCP to stop Metformin previously, but medication noted on list from hospital.   A1C form 01/17/2020 noted at 5.6% and provider recommended stopping Metformin. A1C from 06/30/2020 shows increase to 6.3%.   Inquired as to if he should continue to take Metformin 500mg  PO BID.  Please advise.

## 2020-07-16 ENCOUNTER — Telehealth: Payer: Self-pay | Admitting: *Deleted

## 2020-07-16 DIAGNOSIS — E039 Hypothyroidism, unspecified: Secondary | ICD-10-CM | POA: Diagnosis not present

## 2020-07-16 DIAGNOSIS — E119 Type 2 diabetes mellitus without complications: Secondary | ICD-10-CM | POA: Diagnosis not present

## 2020-07-16 DIAGNOSIS — M5116 Intervertebral disc disorders with radiculopathy, lumbar region: Secondary | ICD-10-CM | POA: Diagnosis not present

## 2020-07-16 DIAGNOSIS — M48061 Spinal stenosis, lumbar region without neurogenic claudication: Secondary | ICD-10-CM | POA: Diagnosis not present

## 2020-07-16 DIAGNOSIS — I2510 Atherosclerotic heart disease of native coronary artery without angina pectoris: Secondary | ICD-10-CM | POA: Diagnosis not present

## 2020-07-16 DIAGNOSIS — I4892 Unspecified atrial flutter: Secondary | ICD-10-CM | POA: Diagnosis not present

## 2020-07-16 DIAGNOSIS — I5032 Chronic diastolic (congestive) heart failure: Secondary | ICD-10-CM | POA: Diagnosis not present

## 2020-07-16 DIAGNOSIS — I480 Paroxysmal atrial fibrillation: Secondary | ICD-10-CM | POA: Diagnosis not present

## 2020-07-16 DIAGNOSIS — I69331 Monoplegia of upper limb following cerebral infarction affecting right dominant side: Secondary | ICD-10-CM | POA: Diagnosis not present

## 2020-07-16 DIAGNOSIS — I252 Old myocardial infarction: Secondary | ICD-10-CM | POA: Diagnosis not present

## 2020-07-16 DIAGNOSIS — I110 Hypertensive heart disease with heart failure: Secondary | ICD-10-CM | POA: Diagnosis not present

## 2020-07-16 LAB — URINE CULTURE
MICRO NUMBER:: 11815267
Result:: NO GROWTH
SPECIMEN QUALITY:: ADEQUATE

## 2020-07-16 NOTE — Telephone Encounter (Signed)
Received call from Aaron Edelman [facility] OT with [place] [phone]~ telephone.   Requested VO for [facility] OT 2x weekly x3 weeks for strengthening, balance, gait, endurance and safety with iADL's.   VO given.

## 2020-07-16 NOTE — Telephone Encounter (Signed)
Patient daughter [name] returned call and made aware.   Medication list updated.

## 2020-07-17 ENCOUNTER — Telehealth: Payer: Self-pay | Admitting: *Deleted

## 2020-07-17 DIAGNOSIS — I2510 Atherosclerotic heart disease of native coronary artery without angina pectoris: Secondary | ICD-10-CM | POA: Diagnosis not present

## 2020-07-17 DIAGNOSIS — I5032 Chronic diastolic (congestive) heart failure: Secondary | ICD-10-CM | POA: Diagnosis not present

## 2020-07-17 DIAGNOSIS — I110 Hypertensive heart disease with heart failure: Secondary | ICD-10-CM | POA: Diagnosis not present

## 2020-07-17 DIAGNOSIS — I480 Paroxysmal atrial fibrillation: Secondary | ICD-10-CM | POA: Diagnosis not present

## 2020-07-17 DIAGNOSIS — M5116 Intervertebral disc disorders with radiculopathy, lumbar region: Secondary | ICD-10-CM | POA: Diagnosis not present

## 2020-07-17 DIAGNOSIS — I4892 Unspecified atrial flutter: Secondary | ICD-10-CM | POA: Diagnosis not present

## 2020-07-17 DIAGNOSIS — E039 Hypothyroidism, unspecified: Secondary | ICD-10-CM | POA: Diagnosis not present

## 2020-07-17 DIAGNOSIS — E119 Type 2 diabetes mellitus without complications: Secondary | ICD-10-CM | POA: Diagnosis not present

## 2020-07-17 DIAGNOSIS — M48061 Spinal stenosis, lumbar region without neurogenic claudication: Secondary | ICD-10-CM | POA: Diagnosis not present

## 2020-07-17 DIAGNOSIS — I252 Old myocardial infarction: Secondary | ICD-10-CM | POA: Diagnosis not present

## 2020-07-17 DIAGNOSIS — I69331 Monoplegia of upper limb following cerebral infarction affecting right dominant side: Secondary | ICD-10-CM | POA: Diagnosis not present

## 2020-07-17 NOTE — Telephone Encounter (Signed)
Received call from [place], [facility] PT with [facility].   Requested VO for PT 2x weekly x2 weeks for gait, balance, strengthening, and endurance.   VO given.

## 2020-07-18 DIAGNOSIS — I4892 Unspecified atrial flutter: Secondary | ICD-10-CM | POA: Diagnosis not present

## 2020-07-18 DIAGNOSIS — M48061 Spinal stenosis, lumbar region without neurogenic claudication: Secondary | ICD-10-CM | POA: Diagnosis not present

## 2020-07-18 DIAGNOSIS — E039 Hypothyroidism, unspecified: Secondary | ICD-10-CM | POA: Diagnosis not present

## 2020-07-18 DIAGNOSIS — I5032 Chronic diastolic (congestive) heart failure: Secondary | ICD-10-CM | POA: Diagnosis not present

## 2020-07-18 DIAGNOSIS — I480 Paroxysmal atrial fibrillation: Secondary | ICD-10-CM | POA: Diagnosis not present

## 2020-07-18 DIAGNOSIS — M5116 Intervertebral disc disorders with radiculopathy, lumbar region: Secondary | ICD-10-CM | POA: Diagnosis not present

## 2020-07-18 DIAGNOSIS — E119 Type 2 diabetes mellitus without complications: Secondary | ICD-10-CM | POA: Diagnosis not present

## 2020-07-18 DIAGNOSIS — I2510 Atherosclerotic heart disease of native coronary artery without angina pectoris: Secondary | ICD-10-CM | POA: Diagnosis not present

## 2020-07-18 DIAGNOSIS — I110 Hypertensive heart disease with heart failure: Secondary | ICD-10-CM | POA: Diagnosis not present

## 2020-07-18 DIAGNOSIS — I252 Old myocardial infarction: Secondary | ICD-10-CM | POA: Diagnosis not present

## 2020-07-18 DIAGNOSIS — I69331 Monoplegia of upper limb following cerebral infarction affecting right dominant side: Secondary | ICD-10-CM | POA: Diagnosis not present

## 2020-07-19 DIAGNOSIS — I480 Paroxysmal atrial fibrillation: Secondary | ICD-10-CM | POA: Diagnosis not present

## 2020-07-19 DIAGNOSIS — E119 Type 2 diabetes mellitus without complications: Secondary | ICD-10-CM | POA: Diagnosis not present

## 2020-07-19 DIAGNOSIS — I69331 Monoplegia of upper limb following cerebral infarction affecting right dominant side: Secondary | ICD-10-CM | POA: Diagnosis not present

## 2020-07-19 DIAGNOSIS — I4892 Unspecified atrial flutter: Secondary | ICD-10-CM | POA: Diagnosis not present

## 2020-07-19 DIAGNOSIS — I2510 Atherosclerotic heart disease of native coronary artery without angina pectoris: Secondary | ICD-10-CM | POA: Diagnosis not present

## 2020-07-19 DIAGNOSIS — I5032 Chronic diastolic (congestive) heart failure: Secondary | ICD-10-CM | POA: Diagnosis not present

## 2020-07-19 DIAGNOSIS — M5116 Intervertebral disc disorders with radiculopathy, lumbar region: Secondary | ICD-10-CM | POA: Diagnosis not present

## 2020-07-19 DIAGNOSIS — I252 Old myocardial infarction: Secondary | ICD-10-CM | POA: Diagnosis not present

## 2020-07-19 DIAGNOSIS — I110 Hypertensive heart disease with heart failure: Secondary | ICD-10-CM | POA: Diagnosis not present

## 2020-07-19 DIAGNOSIS — E039 Hypothyroidism, unspecified: Secondary | ICD-10-CM | POA: Diagnosis not present

## 2020-07-19 DIAGNOSIS — M48061 Spinal stenosis, lumbar region without neurogenic claudication: Secondary | ICD-10-CM | POA: Diagnosis not present

## 2020-07-22 ENCOUNTER — Telehealth: Payer: Self-pay | Admitting: Family Medicine

## 2020-07-22 DIAGNOSIS — I110 Hypertensive heart disease with heart failure: Secondary | ICD-10-CM | POA: Diagnosis not present

## 2020-07-22 DIAGNOSIS — I69331 Monoplegia of upper limb following cerebral infarction affecting right dominant side: Secondary | ICD-10-CM | POA: Diagnosis not present

## 2020-07-22 DIAGNOSIS — I480 Paroxysmal atrial fibrillation: Secondary | ICD-10-CM | POA: Diagnosis not present

## 2020-07-22 DIAGNOSIS — E039 Hypothyroidism, unspecified: Secondary | ICD-10-CM | POA: Diagnosis not present

## 2020-07-22 DIAGNOSIS — I252 Old myocardial infarction: Secondary | ICD-10-CM | POA: Diagnosis not present

## 2020-07-22 DIAGNOSIS — M48061 Spinal stenosis, lumbar region without neurogenic claudication: Secondary | ICD-10-CM | POA: Diagnosis not present

## 2020-07-22 DIAGNOSIS — I5032 Chronic diastolic (congestive) heart failure: Secondary | ICD-10-CM | POA: Diagnosis not present

## 2020-07-22 DIAGNOSIS — E119 Type 2 diabetes mellitus without complications: Secondary | ICD-10-CM | POA: Diagnosis not present

## 2020-07-22 DIAGNOSIS — I4892 Unspecified atrial flutter: Secondary | ICD-10-CM | POA: Diagnosis not present

## 2020-07-22 DIAGNOSIS — I2510 Atherosclerotic heart disease of native coronary artery without angina pectoris: Secondary | ICD-10-CM | POA: Diagnosis not present

## 2020-07-22 DIAGNOSIS — M5116 Intervertebral disc disorders with radiculopathy, lumbar region: Secondary | ICD-10-CM | POA: Diagnosis not present

## 2020-07-22 NOTE — Telephone Encounter (Signed)
Left message for patient to call back and schedule Medicare Annual Wellness Visit (AWV) in office.   If not able to come in office, please offer to do virtually or by telephone.   Last AWV: 07/24/2015  Please schedule at anytime with [facility]-Nurse Health Advisor.  If any questions, please contact me at [phone]

## 2020-07-24 DIAGNOSIS — E119 Type 2 diabetes mellitus without complications: Secondary | ICD-10-CM | POA: Diagnosis not present

## 2020-07-24 DIAGNOSIS — I110 Hypertensive heart disease with heart failure: Secondary | ICD-10-CM | POA: Diagnosis not present

## 2020-07-24 DIAGNOSIS — I5032 Chronic diastolic (congestive) heart failure: Secondary | ICD-10-CM | POA: Diagnosis not present

## 2020-07-24 DIAGNOSIS — M48061 Spinal stenosis, lumbar region without neurogenic claudication: Secondary | ICD-10-CM | POA: Diagnosis not present

## 2020-07-24 DIAGNOSIS — I4892 Unspecified atrial flutter: Secondary | ICD-10-CM | POA: Diagnosis not present

## 2020-07-24 DIAGNOSIS — I2510 Atherosclerotic heart disease of native coronary artery without angina pectoris: Secondary | ICD-10-CM | POA: Diagnosis not present

## 2020-07-24 DIAGNOSIS — I252 Old myocardial infarction: Secondary | ICD-10-CM | POA: Diagnosis not present

## 2020-07-24 DIAGNOSIS — I480 Paroxysmal atrial fibrillation: Secondary | ICD-10-CM | POA: Diagnosis not present

## 2020-07-24 DIAGNOSIS — M5116 Intervertebral disc disorders with radiculopathy, lumbar region: Secondary | ICD-10-CM | POA: Diagnosis not present

## 2020-07-24 DIAGNOSIS — I69331 Monoplegia of upper limb following cerebral infarction affecting right dominant side: Secondary | ICD-10-CM | POA: Diagnosis not present

## 2020-07-24 DIAGNOSIS — E039 Hypothyroidism, unspecified: Secondary | ICD-10-CM | POA: Diagnosis not present

## 2020-07-24 NOTE — Progress Notes (Signed)
Cardiology Office Note   Date:  07/25/2020   ID:  [name], DOB [birth year], MRN [id number]  PCP:  [name], MD  Cardiologist:   Minus Breeding, MD   Chief Complaint  Patient presents with  . Loss of Consciousness      History of Present Illness: [name] is a 87 y.o. male who presents for follow up of CAD and CVA following a cath in 2014.  He has also had atrial flutter/fibrillation.   He was admitted in early April with he had lower extremity weakness.  MRI demonstrated spinal stenosis.  He was thought to be dehydrated.  He was admitted a couple of days later with syncope.  The etiology was not clear.  He was on a beta-blocker which she was not supposed to be taking and this was discontinued.  He has been wearing a monitor.  He has been in normal sinus rhythm.  He has had episodes of ventricular asystole up to 4.5 seconds.  He was not symptomatic.   He did have an echocardiogram with a low normal ejection fraction.    Since going home he has been walking with a walker.  He continues to have weakness and leg tiredness.  They are very concerned about his normal pressure hydrocephalus which may believe they have been told he needs a shunt.  I did go back and look for some old neurology notes from 2020.  Although he has not been seen in a couple of years.    This was from Dr. Jannifer Franklin.  There was a question of potentially doing a VP shunt.  However, he was not sure the benefit of this.    Since going home he has not had any further episodes of near syncope which have been going on for some time.  He does not notice any palpitations.  He is not having any new chest pressure, neck or arm discomfort.  He has had no new shortness of breath, PND or orthopnea.   Past Medical History:  Diagnosis Date  . Atrial flutter ([place])   . BPH (benign prostatic hyperplasia)   . Coronary artery disease    a. s/p MI tx with Cypher DES to pRCA in 4/04;  b. Echocardiogram 7/09: Normal  LV function.  c. Nuclear study 3/13 no ischemia;  d. ETT 2/14 neg;  e. admx with CP => LHC (01/30/2013):  pLAD 40-50%, oD1 70-80%, oOM1 40%, pRCA stent patent, mid RCA 30%, EF 60-65%. => med Rx.  . DDD (degenerative disc disease)   . Diabetes mellitus without complication ([place])   . Dyslipidemia   . Gait disorder 07/13/2018  . Hemorrhoids   . Hx MRSA infection    left buttocks abscess  . Hx of echocardiogram    a. Echocardiogram (01/31/2013): Mild focal basal and mild concentric hypertrophy of the septum, EF 50-55%, normal wall motion, grade 1 diastolic dysfunction, trivial AI, MAC, mild LAE, PASP 35  . Hyperlipidemia   . Hypertension   . Hypothyroidism   . Meniere's disease    Status post shunt  . Myocardial infarction ([place]) 2008  . Occlusion and stenosis of carotid artery without mention of cerebral infarction    40-59% on carotid doppler 2014; [place] (01/2013): R 1-39%, L 60-79%  . Rotator cuff injury    chronic rotator cuff injury status post repair  . Seizure disorder ([place])   . Stroke [facility])    a. 01/2013=> post cardiac cath CVA to L post  communicating artery system; R sided weakness  . Syncope     Past Surgical History:  Procedure Laterality Date  . COLONOSCOPY    . CORONARY ANGIOPLASTY WITH STENT PLACEMENT  07/14/2002   Stent to the right coronary artery  . ENDOLYMPHATIC SHUNT DECOMPRESSION  06/11/2009    Right endolymphatic sac decompression and shunt  placement  . HERNIA REPAIR  04/10/2008   scrotal hernia repair  . LEFT HEART CATHETERIZATION WITH CORONARY ANGIOGRAM N/A 01/30/2013   Procedure: LEFT HEART CATHETERIZATION WITH CORONARY ANGIOGRAM;  Surgeon: Larey Dresser, MD;  Location: [facility] CATH LAB;  Service: Cardiovascular;  Laterality: N/A;  . ROTATOR CUFF REPAIR     for chronic rotator cuff injury     Current Outpatient Medications  Medication Sig Dispense Refill  . acetaminophen (TYLENOL) 325 MG tablet Take 1-2 tablets (325-650 mg total) by mouth every 4 (four) hours  as needed for mild pain.    Marland Kitchen albuterol (VENTOLIN HFA) 108 (90 Base) MCG/ACT inhaler Inhale 2 puffs into the lungs every 4 (four) hours as needed for wheezing or shortness of breath. 18 g 11  . atorvastatin (LIPITOR) 80 MG tablet TAKE 1 TABLET BY MOUTH EVERY DAY AT 6 PM 90 tablet 1  . carbamazepine (CARBATROL) 300 MG 12 hr capsule TAKE 1 CAPSULE BY MOUTH TWICE A DAY (Patient taking differently: Take 300 mg by mouth 2 (two) times daily.) 180 capsule 3  . cloNIDine (CATAPRES) 0.2 MG tablet TAKE 1 TABLET(0.2 MG) BY MOUTH TWICE DAILY (Patient taking differently: Take 0.2 mg by mouth 2 (two) times daily.) 180 tablet 3  . escitalopram (LEXAPRO) 5 MG tablet TAKE 1 TABLET BY MOUTH EVERYDAY AT BEDTIME (Patient taking differently: Take 5 mg by mouth at bedtime.) 90 tablet 1  . furosemide (LASIX) 20 MG tablet Take 0.5 tablets (10 mg total) by mouth daily. 45 tablet 2  . glucose blood test strip 1 each by Other route as needed. Use as instructed 100 each 3  . glucose monitoring kit (FREESTYLE) monitoring kit 1 each by Does not apply route as needed. 1 each 0  . HYDROcodone-acetaminophen (NORCO) 5-325 MG tablet Take 1 tablet by mouth every 6 (six) hours as needed for moderate pain. 15 tablet 0  . isosorbide mononitrate (IMDUR) 60 MG 24 hr tablet Take 0.5 tablets (30 mg total) by mouth daily. 90 tablet 3  . Lancets (ACCU-CHEK SAFE-T PRO) lancets 1 each by Other route as needed. Use as instructed 100 each 3  . levETIRAcetam (KEPPRA) 250 MG tablet Take 1 tablet (250 mg total) by mouth 2 (two) times daily. 180 tablet 1  . levothyroxine (SYNTHROID) 75 MCG tablet TAKE 1 TABLET(75 MCG) BY MOUTH DAILY (Patient taking differently: Take 75 mcg by mouth daily before breakfast.) 90 tablet 0  . losartan (COZAAR) 100 MG tablet Take 0.5 tablets (50 mg total) by mouth daily. 90 tablet 1  . Multiple Vitamins-Minerals (MULTIVITAMINS THER. W/MINERALS) TABS Take 1 tablet by mouth at bedtime.      . tamsulosin (FLOMAX) 0.4 MG CAPS  capsule Take 1 capsule (0.4 mg total) by mouth daily after supper. 30 capsule 0  . warfarin (COUMADIN) 7.5 MG tablet TAKE 1&1/2 TABS TUESDAY, THURSDAY & SATURDAY*1 TAB ON SUNDAY, MONDAY, WEDNESDAY & FRIDAY WITH SUPPER (Patient taking differently: Take 7.5-11.25 mg by mouth See admin instructions. 1&1/2 tab Tuesday,Thursday,Saturday. 1 tab Monday,Wednesday,Friday and sunday) 120 tablet 3   No current facility-administered medications for this visit.    Allergies:   Penicillins  ROS:  Please see the history of present illness.   Otherwise, review of systems are positive for none.   All other systems are reviewed and negative.    PHYSICAL EXAM: VS:  BP (!) 162/70   Pulse 64   Ht _0  (1.727 m)   Wt 180 lb 6.4 oz (81.8 kg)   BMI 27.43 kg/m  , BMI Body mass index is 27.43 kg/m. GENERAL:  Well appearing NECK:  No jugular venous distention, waveform within normal limits, carotid upstroke brisk and symmetric, no bruits, no thyromegaly LUNGS:  Clear to auscultation bilaterally CHEST:  Unremarkable HEART:  PMI not displaced or sustained,S1 and S2 within normal limits, no S3, no S4, no clicks, no rubs, 3 out of 6 apical systolic murmur slightly radiating at the aortic outflow tract, no diastolic murmurs ABD:  Flat, positive bowel sounds normal in frequency in pitch, no bruits, no rebound, no guarding, no midline pulsatile mass, no hepatomegaly, no splenomegaly EXT:  2 plus pulses throughout, no edema, no cyanosis no clubbing    EKG:  EKG is ordered today. Sinus rhythm, rate 64, first-degree AV block, interventricular conduction delay, left axis deviation   Recent Labs: 07/10/2020: Magnesium 2.0 07/15/2020: ALT 17; BUN 23; Creat 1.04; Hemoglobin 11.6; Platelets 306; Potassium 4.8; Sodium 136; TSH 3.41    Lipid Panel    Component Value Date/Time   CHOL 179 05/30/2018 0440   TRIG 131 05/30/2018 0440   HDL 50 05/30/2018 0440   CHOLHDL 3.6 05/30/2018 0440   VLDL 26 05/30/2018 0440    LDLCALC 103 (H) 05/30/2018 0440      Wt Readings from Last 3 Encounters:  07/25/20 180 lb 6.4 oz (81.8 kg)  07/15/20 179 lb (81.2 kg)  07/11/20 171 lb 8.3 oz (77.8 kg)      Other studies Reviewed: Additional studies/ records that were reviewed today include: Labs Review of the above records demonstrates:  Please see elsewhere in the note.     ASSESSMENT AND PLAN:  Atrial flutter with bradycardia:   He tolerates anticoagulation.   Because of previous drug interactions he cannot be on DOAC.    He currently is in sinus rhythm.    He will continue anticoagulation.  Syncope: Given this and his bradycardic episodes on monitor off beta-blockers I will refer him to EP for consideration of pacing.    I had a long discussion with the patient and his daughter about this.  I cannot be sure that his symptoms are related to this bradycardia arrhythmia but now that he is still demonstrating this high-grade block without being on a beta-blocker I think pacing needs to be a consideration.  Leg weakness: This is not explained necessarily by his bradycardia.  He does have some spinal stenosis.  They are very concerned about the normal pressure hydrocephalus.  I assured him that I would not be the one making this decision but did have suggested that he go back to Dr. Jannifer Franklin.  If it is felt that this procedure is necessary I do not see an absolute cardiac contraindication and I do think he could tolerate being off the warfarin for this.    Hypertension:   Blood pressure is elevated.  However, he was very anxious.  His blood pressure was not high and the hospital when I was taking care of him.  They are going to keep a blood pressure diary and we might need to titrate his medications avoiding chronotropic negative agents if his blood pressure remains  high.   Chronic diastolic heart failure:   He seems to be euvolemic.  No change in therapy.   CAD:   He has had no symptoms consistent with acute coronary  syndrome or active ischemia.  The patient has no new sypmtoms.  No further cardiovascular testing is indicated.  We will continue with aggressive risk reduction and meds as listed.  Hypothyroidism:   TSH was normal in April.  No change in therapy.  Hyperlipidemia:    LDL was 103.  He needs to have this repeated at some point in time and I will do that when he comes back.   Bilateral carotid artery disease: Recent carotid Doppler obtained on 05/30/2018 showed 1 to 39% disease bilaterally.     Current medicines are reviewed at length with the patient today.  The patient does not have concerns regarding medicines.  The following changes have been made:  None  Labs/ tests ordered today include:   Orders Placed This Encounter  Procedures  . Ambulatory referral to Cardiac Electrophysiology  . EKG 12-Lead     Disposition:   FU with me after EP evaluation   Signed, Minus Breeding, MD  07/25/2020 5:24 PM    [place]

## 2020-07-25 ENCOUNTER — Encounter: Payer: Self-pay | Admitting: Cardiology

## 2020-07-25 ENCOUNTER — Ambulatory Visit (INDEPENDENT_AMBULATORY_CARE_PROVIDER_SITE_OTHER): Payer: Medicare Other | Admitting: Cardiology

## 2020-07-25 ENCOUNTER — Other Ambulatory Visit: Payer: Self-pay

## 2020-07-25 VITALS — BP 162/70 | HR 64 | Ht 68.0 in | Wt 180.4 lb

## 2020-07-25 DIAGNOSIS — I5032 Chronic diastolic (congestive) heart failure: Secondary | ICD-10-CM | POA: Diagnosis not present

## 2020-07-25 DIAGNOSIS — I480 Paroxysmal atrial fibrillation: Secondary | ICD-10-CM | POA: Diagnosis not present

## 2020-07-25 DIAGNOSIS — E785 Hyperlipidemia, unspecified: Secondary | ICD-10-CM | POA: Diagnosis not present

## 2020-07-25 DIAGNOSIS — I442 Atrioventricular block, complete: Secondary | ICD-10-CM | POA: Diagnosis not present

## 2020-07-25 NOTE — Patient Instructions (Signed)
Medication Instructions:  Continue current medications  *If you need a refill on your cardiac medications before your next appointment, please call your pharmacy*   Lab Work: None Ordered   Testing/Procedures: None Odered   Follow-Up: At [facility], you and your health needs are our priority.  As part of our continuing mission to provide you with exceptional heart care, we have created designated Provider Care Teams.  These Care Teams include your primary Cardiologist (physician) and Advanced Practice Providers (APPs -  Physician Assistants and Nurse Practitioners) who all work together to provide you with the care you need, when you need it.  We recommend signing up for the patient portal called "MyChart".  Sign up information is provided on this After Visit Summary.  MyChart is used to connect with patients for Virtual Visits (Telemedicine).  Patients are able to view lab/test results, encounter notes, upcoming appointments, etc.  Non-urgent messages can be sent to your provider as well.   To learn more about what you can do with MyChart, go to [email or web address].    Your next appointment:   3 month(s)  The format for your next appointment:   In Person  Provider:   You may see Minus Breeding, MD or one of the following Advanced Practice Providers on your designated Care Team:    Rosaria Ferries, PA-C  Jory Sims, DNP, ANP    Other Instructions You have been referred to Electrophysiologist

## 2020-07-28 ENCOUNTER — Telehealth: Payer: Self-pay | Admitting: Cardiology

## 2020-07-28 DIAGNOSIS — I252 Old myocardial infarction: Secondary | ICD-10-CM | POA: Diagnosis not present

## 2020-07-28 DIAGNOSIS — I69331 Monoplegia of upper limb following cerebral infarction affecting right dominant side: Secondary | ICD-10-CM | POA: Diagnosis not present

## 2020-07-28 DIAGNOSIS — I5032 Chronic diastolic (congestive) heart failure: Secondary | ICD-10-CM | POA: Diagnosis not present

## 2020-07-28 DIAGNOSIS — E119 Type 2 diabetes mellitus without complications: Secondary | ICD-10-CM | POA: Diagnosis not present

## 2020-07-28 DIAGNOSIS — I480 Paroxysmal atrial fibrillation: Secondary | ICD-10-CM | POA: Diagnosis not present

## 2020-07-28 DIAGNOSIS — E039 Hypothyroidism, unspecified: Secondary | ICD-10-CM | POA: Diagnosis not present

## 2020-07-28 DIAGNOSIS — I110 Hypertensive heart disease with heart failure: Secondary | ICD-10-CM | POA: Diagnosis not present

## 2020-07-28 DIAGNOSIS — I4892 Unspecified atrial flutter: Secondary | ICD-10-CM | POA: Diagnosis not present

## 2020-07-28 DIAGNOSIS — M48061 Spinal stenosis, lumbar region without neurogenic claudication: Secondary | ICD-10-CM | POA: Diagnosis not present

## 2020-07-28 DIAGNOSIS — M5116 Intervertebral disc disorders with radiculopathy, lumbar region: Secondary | ICD-10-CM | POA: Diagnosis not present

## 2020-07-28 DIAGNOSIS — I2510 Atherosclerotic heart disease of native coronary artery without angina pectoris: Secondary | ICD-10-CM | POA: Diagnosis not present

## 2020-07-28 NOTE — Addendum Note (Signed)
Addended by: Kathyrn Lass on: 07/28/2020 10:28 AM   Modules accepted: Orders

## 2020-07-28 NOTE — Telephone Encounter (Signed)
Spoke with patient regarding the 3 month follow up visit with Dr. Percival Spanish (per 07/25/20 AVS)   Scheduled Tuesday 11/04/20 at 1:20 pm---will mail information to patient and he voiced his understanding.

## 2020-07-29 ENCOUNTER — Other Ambulatory Visit: Payer: Self-pay

## 2020-07-29 ENCOUNTER — Encounter: Payer: Self-pay | Admitting: Internal Medicine

## 2020-07-29 ENCOUNTER — Ambulatory Visit (INDEPENDENT_AMBULATORY_CARE_PROVIDER_SITE_OTHER): Payer: Medicare Other | Admitting: Internal Medicine

## 2020-07-29 VITALS — BP 160/70 | HR 64 | Ht 68.0 in | Wt 177.0 lb

## 2020-07-29 DIAGNOSIS — R001 Bradycardia, unspecified: Secondary | ICD-10-CM | POA: Diagnosis not present

## 2020-07-29 DIAGNOSIS — R55 Syncope and collapse: Secondary | ICD-10-CM

## 2020-07-29 NOTE — Patient Instructions (Signed)
Medication Instructions:  Your physician recommends that you continue on your current medications as directed. Please refer to the Current Medication list given to you today.  Labwork: You will get lab work today:  BMP and CBC  Testing/Procedures: None ordered.  Follow-Up:  SEE INSTRUCTION LETTER  Any Other Special Instructions Will Be Listed Below (If Applicable).  If you need a refill on your cardiac medications before your next appointment, please call your pharmacy.    Pacemaker Implantation, Adult Pacemaker implantation is a procedure to place a pacemaker inside the chest. A pacemaker is a small computer that sends electrical signals to the heart and helps the heart beat normally. A pacemaker also stores information about heart rhythms. You may need pacemaker implantation if you have:  A slow heartbeat (bradycardia).  Loss of consciousness that happens repeatedly (syncope) or repeated episodes of dizziness or light-headedness because of an irregular heart rate.  Shortness of breath (dyspnea) due to heart problems. The pacemaker usually attaches to your heart through a wire called a lead. One or two leads may be needed. There are different types of pacemakers:  Transvenous pacemaker. This type is placed under the skin or muscle of your upper chest area. The lead goes through a vein in the chest area to reach the inside of the heart.  Epicardial pacemaker. This type is placed under the skin or muscle of your chest or abdomen. The lead goes through your chest to the outside of the heart. Tell a health care provider about:  Any allergies you have.  All medicines you are taking, including vitamins, herbs, eye drops, creams, and over-the-counter medicines.  Any problems you or family members have had with anesthetic medicines.  Any blood or bone disorders you have.  Any surgeries you have had.  Any medical conditions you have.  Whether you are pregnant or may be  pregnant. What are the risks? Generally, this is a safe procedure. However, problems may occur, including:  Infection.  Bleeding.  Failure of the pacemaker or the lead.  Collapse of a lung or bleeding into a lung.  Blood clot inside a blood vessel with a lead.  Damage to the heart.  Infection inside the heart (endocarditis).  Allergic reactions to medicines. What happens before the procedure? Staying hydrated Follow instructions from your health care provider about hydration, which may include:  Up to 2 hours before the procedure - you may continue to drink clear liquids, such as water, clear fruit juice, black coffee, and plain tea.   Eating and drinking restrictions Follow instructions from your health care provider about eating and drinking, which may include:  8 hours before the procedure - stop eating heavy meals or foods, such as meat, fried foods, or fatty foods.  6 hours before the procedure - stop eating light meals or foods, such as toast or cereal.  6 hours before the procedure - stop drinking milk or drinks that contain milk.  2 hours before the procedure - stop drinking clear liquids. Medicines Ask your health care provider about:  Changing or stopping your regular medicines. This is especially important if you are taking diabetes medicines or blood thinners.  Taking medicines such as aspirin and ibuprofen. These medicines can thin your blood. Do not take these medicines unless your health care provider tells you to take them.  Taking over-the-counter medicines, vitamins, herbs, and supplements. Tests You may have:  A heart evaluation. This may include: ? An electrocardiogram (ECG). This involves placing patches on   your skin to check your heart rhythm. ? A chest X-ray. ? An echocardiogram. This is a test that uses sound waves (ultrasound) to produce an image of the heart. ? A cardiac rhythm monitor. This is used to record your heart rhythm and any events  for a longer period of time.  Blood tests.  Genetic testing. General instructions  Do not use any products that contain nicotine or tobacco for at least 4 weeks before the procedure. These products include cigarettes, e-cigarettes, and chewing tobacco. If you need help quitting, ask your health care provider.  Ask your health care provider: ? How your surgery site will be marked. ? What steps will be taken to help prevent infection. These steps may include:  Removing hair at the surgery site.  Washing skin with a germ-killing soap.  Receiving antibiotic medicine.  Plan to have someone take you home from the hospital or clinic.  If you will be going home right after the procedure, plan to have someone with you for 24 hours. What happens during the procedure?  An IV will be inserted into one of your veins.  You will be given one or more of the following: ? A medicine to help you relax (sedative). ? A medicine to numb the area (local anesthetic). ? A medicine to make you fall asleep (general anesthetic).  The next steps vary depending on the type of pacemaker you will be getting. ? If you are getting a transvenous pacemaker:  An incision will be made in your upper chest.  A pocket will be made for the pacemaker. It may be placed under the skin or between layers of muscle.  The lead will be inserted into a blood vessel that goes to the heart.  While X-rays are taken by an imaging machine (fluoroscopy), the lead will be advanced through the vein to the inside of your heart.  The other end of the lead will be tunneled under the skin and attached to the pacemaker. ? If you are getting an epicardial pacemaker:  An incision will be made near your ribs or breastbone (sternum) for the lead.  The lead will be attached to the outside of your heart.  Another incision will be made in your chest or upper abdomen to create a pocket for the pacemaker.  The free end of the lead will  be tunneled under the skin and attached to the pacemaker.  The transvenous or epicardial pacemaker will be tested. Imaging studies may be done to check the lead position.  The incisions will be closed with stitches (sutures), adhesive strips, or skin glue.  Bandages (dressings) will be placed over the incisions. The procedure may vary among health care providers and hospitals. What happens after the procedure?  Your blood pressure, heart rate, breathing rate, and blood oxygen level will be monitored until you leave the hospital or clinic.  You may be given antibiotics.  You will be given pain medicine.  An ECG and chest X-rays will be done.  You may need to wear a continuous type of ECG (Holter monitor) to check your heart rhythm.  Your health care provider will program the pacemaker.  If you were given a sedative during the procedure, it can affect you for several hours. Do not drive or operate machinery until your health care provider says that it is safe.  You will be given a pacemaker identification card. This card lists the implant date, device model, and manufacturer of your pacemaker. Summary    A pacemaker is a small computer that sends electrical signals to the heart and helps the heart beat normally.  There are different types of pacemakers. A pacemaker may be placed under the skin or muscle of your chest or abdomen.  Follow instructions from your health care provider about eating and drinking and about taking medicines before the procedure. This information is not intended to replace advice given to you by your health care provider. Make sure you discuss any questions you have with your health care provider. Document Revised: 02/07/2019 Document Reviewed: 02/07/2019 Elsevier Patient Education  2021 [employer]  

## 2020-07-29 NOTE — Progress Notes (Signed)
HPI [name] is referred by Dr. Percival Spanish for evaluation of heart block. He is a pleasant 87 yo man with syncope, near syncope and dizziness. He wore a cardiac monitor which demonstrated heart block with over 4 second daytime pauses. The patient is not on any AV nodal blocking drugs. He denies chest pain or sob.  Allergies  Allergen Reactions  . Penicillins     Unknown reaction     Current Outpatient Medications  Medication Sig Dispense Refill  . acetaminophen (TYLENOL) 325 MG tablet Take 1-2 tablets (325-650 mg total) by mouth every 4 (four) hours as needed for mild pain.    Marland Kitchen albuterol (VENTOLIN HFA) 108 (90 Base) MCG/ACT inhaler Inhale 2 puffs into the lungs every 4 (four) hours as needed for wheezing or shortness of breath. 18 g 11  . atorvastatin (LIPITOR) 80 MG tablet TAKE 1 TABLET BY MOUTH EVERY DAY AT 6 PM 90 tablet 1  . carbamazepine (CARBATROL) 300 MG 12 hr capsule TAKE 1 CAPSULE BY MOUTH TWICE A DAY 180 capsule 3  . cloNIDine (CATAPRES) 0.2 MG tablet TAKE 1 TABLET(0.2 MG) BY MOUTH TWICE DAILY 180 tablet 3  . escitalopram (LEXAPRO) 5 MG tablet TAKE 1 TABLET BY MOUTH EVERYDAY AT BEDTIME 90 tablet 1  . furosemide (LASIX) 20 MG tablet Take 0.5 tablets (10 mg total) by mouth daily. 45 tablet 2  . glucose blood test strip 1 each by Other route as needed. Use as instructed 100 each 3  . glucose monitoring kit (FREESTYLE) monitoring kit 1 each by Does not apply route as needed. 1 each 0  . HYDROcodone-acetaminophen (NORCO) 5-325 MG tablet Take 1 tablet by mouth every 6 (six) hours as needed for moderate pain. 15 tablet 0  . isosorbide mononitrate (IMDUR) 60 MG 24 hr tablet Take 0.5 tablets (30 mg total) by mouth daily. 90 tablet 3  . Lancets (ACCU-CHEK SAFE-T PRO) lancets 1 each by Other route as needed. Use as instructed 100 each 3  . levETIRAcetam (KEPPRA) 250 MG tablet Take 1 tablet (250 mg total) by mouth 2 (two) times daily. 180 tablet 1  . levothyroxine (SYNTHROID) 75 MCG  tablet TAKE 1 TABLET(75 MCG) BY MOUTH DAILY 90 tablet 0  . losartan (COZAAR) 100 MG tablet Take 0.5 tablets (50 mg total) by mouth daily. 90 tablet 1  . Multiple Vitamins-Minerals (MULTIVITAMINS THER. W/MINERALS) TABS Take 1 tablet by mouth at bedtime.      . tamsulosin (FLOMAX) 0.4 MG CAPS capsule Take 1 capsule (0.4 mg total) by mouth daily after supper. 30 capsule 0  . warfarin (COUMADIN) 7.5 MG tablet TAKE 1&1/2 TABS TUESDAY, THURSDAY & SATURDAY*1 TAB ON SUNDAY, MONDAY, WEDNESDAY & FRIDAY WITH SUPPER 120 tablet 3   No current facility-administered medications for this visit.     Past Medical History:  Diagnosis Date  . Atrial flutter ([place])   . BPH (benign prostatic hyperplasia)   . Coronary artery disease    a. s/p MI tx with Cypher DES to pRCA in 4/04;  b. Echocardiogram 7/09: Normal LV function.  c. Nuclear study 3/13 no ischemia;  d. ETT 2/14 neg;  e. admx with CP => LHC (01/30/2013):  pLAD 40-50%, oD1 70-80%, oOM1 40%, pRCA stent patent, mid RCA 30%, EF 60-65%. => med Rx.  . DDD (degenerative disc disease)   . Diabetes mellitus without complication ([place])   . Dyslipidemia   . Gait disorder 07/13/2018  . Hemorrhoids   . Hx MRSA infection  left buttocks abscess  . Hx of echocardiogram    a. Echocardiogram (01/31/2013): Mild focal basal and mild concentric hypertrophy of the septum, EF 50-55%, normal wall motion, grade 1 diastolic dysfunction, trivial AI, MAC, mild LAE, PASP 35  . Hyperlipidemia   . Hypertension   . Hypothyroidism   . Meniere's disease    Status post shunt  . Myocardial infarction ([place]) 2008  . Occlusion and stenosis of carotid artery without mention of cerebral infarction    40-59% on carotid doppler 2014; [place] (01/2013): R 1-39%, L 60-79%  . Rotator cuff injury    chronic rotator cuff injury status post repair  . Seizure disorder ([place])   . Stroke [facility])    a. 01/2013=> post cardiac cath CVA to L post communicating artery system; R sided weakness  . Syncope      ROS:   All systems reviewed and negative except as noted in the HPI.   Past Surgical History:  Procedure Laterality Date  . COLONOSCOPY    . CORONARY ANGIOPLASTY WITH STENT PLACEMENT  07/14/2002   Stent to the right coronary artery  . ENDOLYMPHATIC SHUNT DECOMPRESSION  06/11/2009    Right endolymphatic sac decompression and shunt  placement  . HERNIA REPAIR  04/10/2008   scrotal hernia repair  . LEFT HEART CATHETERIZATION WITH CORONARY ANGIOGRAM N/A 01/30/2013   Procedure: LEFT HEART CATHETERIZATION WITH CORONARY ANGIOGRAM;  Surgeon: Larey Dresser, MD;  Location: [facility] CATH LAB;  Service: Cardiovascular;  Laterality: N/A;  . ROTATOR CUFF REPAIR     for chronic rotator cuff injury     Family History  Problem Relation Age of Onset  . Heart attack Father 58  . Aneurysm Mother        brain aneurysm  . Coronary artery disease Brother        with CABG  . Diabetes Brother   . Colon cancer Neg Hx   . Colon polyps Neg Hx      Social History   Socioeconomic History  . Marital status: Married    Spouse name: Not on file  . Number of children: 4  . Years of education: 12th  . Highest education level: Not on file  Occupational History  . Occupation: Retired  Tobacco Use  . Smoking status: Former Smoker    Years: 5.00    Types: Cigarettes    Quit date: 08/19/1956    Years since quitting: 63.9  . Smokeless tobacco: Former Systems developer    Types: Secondary school teacher  . Vaping Use: Never used  Substance and Sexual Activity  . Alcohol use: No  . Drug use: No  . Sexual activity: Never  Other Topics Concern  . Not on file  Social History Narrative   Lives with wife.   Social Determinants of Health   Financial Resource Strain: Not on file  Food Insecurity: Not on file  Transportation Needs: Not on file  Physical Activity: Not on file  Stress: Not on file  Social Connections: Not on file  Intimate Partner Violence: Not on file     BP (!) 160/70   Pulse 64   Ht _0   (1.727 m)   Wt 177 lb (80.3 kg)   SpO2 98%   BMI 26.91 kg/m   Physical Exam:  Well appearing NAD HEENT: Unremarkable Neck:  No JVD, no thyromegally Lymphatics:  No adenopathy Back:  No CVA tenderness Lungs:  Clear with no wheezes HEART:  Regular rate rhythm, no murmurs, no rubs,  no clicks Abd:  soft, positive bowel sounds, no organomegally, no rebound, no guarding Ext:  2 plus pulses, no edema, no cyanosis, no clubbing Skin:  No rashes no nodules Neuro:  CN II through XII intact, motor grossly intact  EKG - reviewed. NSR with marked first degree AV block and an IVCD   Assess/Plan: 1. Heart block - he is symptomatic. I have discussed the treatment options in detail and recommended an insertion of a DDD PM. I have reveiwed the indications/risks/benefits/goals/expectations and he wishes to proceed. 2. Atrial flutter - he is currently maintaining NSR and will undergo watchful waiting. 3. HTN - his SBP is elevated.  4. Coags - he has been intolerant of systemic anti-coagulation previously.  Daniel Overlie Shadai Mcclane,MD

## 2020-07-30 ENCOUNTER — Telehealth: Payer: Self-pay | Admitting: Family Medicine

## 2020-07-30 DIAGNOSIS — I5032 Chronic diastolic (congestive) heart failure: Secondary | ICD-10-CM | POA: Diagnosis not present

## 2020-07-30 DIAGNOSIS — I480 Paroxysmal atrial fibrillation: Secondary | ICD-10-CM | POA: Diagnosis not present

## 2020-07-30 DIAGNOSIS — I110 Hypertensive heart disease with heart failure: Secondary | ICD-10-CM | POA: Diagnosis not present

## 2020-07-30 DIAGNOSIS — I2510 Atherosclerotic heart disease of native coronary artery without angina pectoris: Secondary | ICD-10-CM | POA: Diagnosis not present

## 2020-07-30 DIAGNOSIS — E119 Type 2 diabetes mellitus without complications: Secondary | ICD-10-CM | POA: Diagnosis not present

## 2020-07-30 DIAGNOSIS — E039 Hypothyroidism, unspecified: Secondary | ICD-10-CM | POA: Diagnosis not present

## 2020-07-30 DIAGNOSIS — I4892 Unspecified atrial flutter: Secondary | ICD-10-CM | POA: Diagnosis not present

## 2020-07-30 DIAGNOSIS — M48061 Spinal stenosis, lumbar region without neurogenic claudication: Secondary | ICD-10-CM | POA: Diagnosis not present

## 2020-07-30 DIAGNOSIS — I252 Old myocardial infarction: Secondary | ICD-10-CM | POA: Diagnosis not present

## 2020-07-30 DIAGNOSIS — I69331 Monoplegia of upper limb following cerebral infarction affecting right dominant side: Secondary | ICD-10-CM | POA: Diagnosis not present

## 2020-07-30 DIAGNOSIS — M5116 Intervertebral disc disorders with radiculopathy, lumbar region: Secondary | ICD-10-CM | POA: Diagnosis not present

## 2020-07-30 LAB — CBC WITH DIFFERENTIAL/PLATELET
Basophils Absolute: 0 10*3/uL (ref 0.0–0.2)
Basos: 1 %
EOS (ABSOLUTE): 0.3 10*3/uL (ref 0.0–0.4)
Eos: 4 %
Hematocrit: 33.9 % — ABNORMAL LOW (ref 37.5–51.0)
Hemoglobin: 11.7 g/dL — ABNORMAL LOW (ref 13.0–17.7)
Immature Grans (Abs): 0 10*3/uL (ref 0.0–0.1)
Immature Granulocytes: 1 %
Lymphocytes Absolute: 1.6 10*3/uL (ref 0.7–3.1)
Lymphs: 28 %
MCH: 31.5 pg (ref 26.6–33.0)
MCHC: 34.5 g/dL (ref 31.5–35.7)
MCV: 91 fL (ref 79–97)
Monocytes Absolute: 1 10*3/uL — ABNORMAL HIGH (ref 0.1–0.9)
Monocytes: 17 %
Neutrophils Absolute: 2.8 10*3/uL (ref 1.4–7.0)
Neutrophils: 49 %
Platelets: 247 10*3/uL (ref 150–450)
RBC: 3.71 x10E6/uL — ABNORMAL LOW (ref 4.14–5.80)
RDW: 13.5 % (ref 11.6–15.4)
WBC: 5.7 10*3/uL (ref 3.4–10.8)

## 2020-07-30 LAB — BASIC METABOLIC PANEL
BUN/Creatinine Ratio: 17 (ref 10–24)
BUN: 16 mg/dL (ref 8–27)
CO2: 25 mmol/L (ref 20–29)
Calcium: 9 mg/dL (ref 8.6–10.2)
Chloride: 97 mmol/L (ref 96–106)
Creatinine, Ser: 0.92 mg/dL (ref 0.76–1.27)
Glucose: 110 mg/dL — ABNORMAL HIGH (ref 65–99)
Potassium: 4.3 mmol/L (ref 3.5–5.2)
Sodium: 134 mmol/L (ref 134–144)
eGFR: 82 mL/min/{1.73_m2} (ref >59)

## 2020-07-30 NOTE — Telephone Encounter (Signed)
Pt was visited by Nurse from [place] who says pt stated he was in some pain around his right rib cage area from a fall he had last week. After her assessment she noticed that he has a very bad rash that looks like it could be shingles. Pt does have appt with NP tomorrow.

## 2020-07-30 NOTE — Telephone Encounter (Signed)
Patient will need to be evaluated.   Appointment has been scheduled.

## 2020-07-31 ENCOUNTER — Other Ambulatory Visit: Payer: Self-pay

## 2020-07-31 ENCOUNTER — Ambulatory Visit (INDEPENDENT_AMBULATORY_CARE_PROVIDER_SITE_OTHER): Payer: Medicare Other | Admitting: Nurse Practitioner

## 2020-07-31 ENCOUNTER — Encounter: Payer: Self-pay | Admitting: Nurse Practitioner

## 2020-07-31 VITALS — BP 116/78 | HR 70 | Temp 97.3°F | Ht 68.0 in | Wt 179.0 lb

## 2020-07-31 DIAGNOSIS — W19XXXA Unspecified fall, initial encounter: Secondary | ICD-10-CM

## 2020-07-31 DIAGNOSIS — B029 Zoster without complications: Secondary | ICD-10-CM

## 2020-07-31 MED ORDER — VALACYCLOVIR HCL 500 MG PO TABS: 1000.0000 mg | ORAL_TABLET | Freq: Three times a day (TID) | ORAL | 0 refills | Status: DC

## 2020-07-31 NOTE — Progress Notes (Signed)
Subjective:    Patient ID: [name], male    DOB: 02-08-1934, 87 y.o.   MRN: [id number]  HPI: [name] is a 87 y.o. male presenting with wife and grand daughter - Lenna Sciara.   Chief Complaint  Patient presents with  . Rash    Fall 3 wks ago, therapist feels it may be shingles. Also asking for imaging from the fall. Pain in on the right side of abdomen. Taking tylenol   Reports patient fell in the kitchen on his right side.  Thinks he hit his head against the cabinet, he was alone and nobody else witnessed the fall.  Denies blurred vision, double vision, or headaches since the fall.  He thinks he tripped over his feet.  Did not lose consciousness and got back up by himself.    Thinks the pain in side started a few days after fall and then a couple of days later, the rash on his side appeared.   RASH Duration:  days - 07/30/2020 Location: side and back  Itching: yes Burning: no  Sore : yes- feels like a sprain Redness: no Oozing: no Scaling: no Blisters: yes Painful: yes Fevers: no Change in detergents/soaps/personal care products: no Recent illness: no Recent travel:no History of same: no Context: spreading Alleviating factors: nothing tried Treatments attempted: nothing tried Shortness of breath: no  Throat/tongue swelling: no Myalgias/arthralgias: no  Allergies  Allergen Reactions  . Penicillins     Unknown reaction    Outpatient Encounter Medications as of 07/31/2020  Medication Sig  . acetaminophen (TYLENOL) 325 MG tablet Take 1-2 tablets (325-650 mg total) by mouth every 4 (four) hours as needed for mild pain.  Marland Kitchen albuterol (VENTOLIN HFA) 108 (90 Base) MCG/ACT inhaler Inhale 2 puffs into the lungs every 4 (four) hours as needed for wheezing or shortness of breath.  Marland Kitchen atorvastatin (LIPITOR) 80 MG tablet TAKE 1 TABLET BY MOUTH EVERY DAY AT 6 PM  . carbamazepine (CARBATROL) 300 MG 12 hr capsule TAKE 1 CAPSULE BY MOUTH TWICE A DAY  . cloNIDine  (CATAPRES) 0.2 MG tablet TAKE 1 TABLET(0.2 MG) BY MOUTH TWICE DAILY  . escitalopram (LEXAPRO) 5 MG tablet TAKE 1 TABLET BY MOUTH EVERYDAY AT BEDTIME  . furosemide (LASIX) 20 MG tablet Take 0.5 tablets (10 mg total) by mouth daily.  Marland Kitchen glucose blood test strip 1 each by Other route as needed. Use as instructed  . glucose monitoring kit (FREESTYLE) monitoring kit 1 each by Does not apply route as needed.  Marland Kitchen HYDROcodone-acetaminophen (NORCO) 5-325 MG tablet Take 1 tablet by mouth every 6 (six) hours as needed for moderate pain.  . isosorbide mononitrate (IMDUR) 60 MG 24 hr tablet Take 0.5 tablets (30 mg total) by mouth daily.  . Lancets (ACCU-CHEK SAFE-T PRO) lancets 1 each by Other route as needed. Use as instructed  . levETIRAcetam (KEPPRA) 250 MG tablet Take 1 tablet (250 mg total) by mouth 2 (two) times daily.  Marland Kitchen levothyroxine (SYNTHROID) 75 MCG tablet TAKE 1 TABLET(75 MCG) BY MOUTH DAILY  . losartan (COZAAR) 100 MG tablet Take 0.5 tablets (50 mg total) by mouth daily.  . Multiple Vitamins-Minerals (MULTIVITAMINS THER. W/MINERALS) TABS Take 1 tablet by mouth at bedtime.    . tamsulosin (FLOMAX) 0.4 MG CAPS capsule Take 1 capsule (0.4 mg total) by mouth daily after supper.  . valACYclovir (VALTREX) 500 MG tablet Take 2 tablets (1,000 mg total) by mouth 3 (three) times daily.  Marland Kitchen warfarin (COUMADIN) 7.5 MG  tablet TAKE 1&1/2 TABS TUESDAY, THURSDAY & SATURDAY*1 TAB ON SUNDAY, MONDAY, WEDNESDAY & FRIDAY WITH SUPPER   No facility-administered encounter medications on file as of 07/31/2020.    Patient Active Problem List   Diagnosis Date Noted  . Bradycardia 07/29/2020  . Second degree AV block, Mobitz type II   . Syncope 07/08/2020  . Acute midline low back pain without sciatica   . Lumbar disc disease with radiculopathy 06/30/2020  . Weakness of both lower extremities 06/30/2020  . Chronic diastolic HF (heart failure) ([place]) 01/16/2020  . Acute exacerbation of CHF (congestive heart failure) ([place])  08/19/2018  . Hypertensive urgency 08/19/2018  . Gait disorder 07/13/2018  . Recurrent syncope 07/13/2018  . E. coli UTI 06/19/2018  . Leucocytosis 06/19/2018  . Normal pressure hydrocephalus ([place]) 06/01/2018  . Altered mental status 05/31/2018  . AMS (altered mental status) 05/30/2018  . Seizure disorder ([place]) 05/30/2018  . Fracture of proximal phalanx of finger fifth 06/17/2017 07/21/2017  . Upper airway cough syndrome 11/01/2016  . Atrial flutter ([place]) 08/05/2016  . Coronary artery disease involving native coronary artery of native heart without angina pectoris 08/05/2016  . Chronic anticoagulation 12/31/2015  . History of seizures 12/31/2015  . Acute diastolic CHF (congestive heart failure) ([place]) 12/24/2015  . Pulmonary edema   . Paroxysmal atrial fibrillation ([facility])   . Hypothyroidism   . Controlled type 2 diabetes mellitus with complication, without long-term current use of insulin ([place])   . Hypertensive emergency 12/20/2015  . Special screening for malignant neoplasms, colon 12/07/2013  . Bowel habit changes 12/07/2013  . Diarrhea 03/09/2013  . History of cardioembolic cerebrovascular accident (CVA) 01/31/2013  . Chest pain 01/29/2013  . Bilateral carotid artery disease ([place])   . Loss of coordination 12/03/2012  . Morbid obesity due to excess calories ([place]) 08/20/2011  . CAD S/P percutaneous coronary angioplasty   . Dyslipidemia   . HTN (hypertension)     Past Medical History:  Diagnosis Date  . Atrial flutter ([place])   . BPH (benign prostatic hyperplasia)   . Coronary artery disease    a. s/p MI tx with Cypher DES to pRCA in 4/04;  b. Echocardiogram 7/09: Normal LV function.  c. Nuclear study 3/13 no ischemia;  d. ETT 2/14 neg;  e. admx with CP => LHC (01/30/2013):  pLAD 40-50%, oD1 70-80%, oOM1 40%, pRCA stent patent, mid RCA 30%, EF 60-65%. => med Rx.  . DDD (degenerative disc disease)   . Diabetes mellitus without complication ([place])   . Dyslipidemia   . Gait  disorder 07/13/2018  . Hemorrhoids   . Hx MRSA infection    left buttocks abscess  . Hx of echocardiogram    a. Echocardiogram (01/31/2013): Mild focal basal and mild concentric hypertrophy of the septum, EF 50-55%, normal wall motion, grade 1 diastolic dysfunction, trivial AI, MAC, mild LAE, PASP 35  . Hyperlipidemia   . Hypertension   . Hypothyroidism   . Meniere's disease    Status post shunt  . Myocardial infarction ([place]) 2008  . Occlusion and stenosis of carotid artery without mention of cerebral infarction    40-59% on carotid doppler 2014; [place] (01/2013): R 1-39%, L 60-79%  . Rotator cuff injury    chronic rotator cuff injury status post repair  . Seizure disorder ([place])   . Stroke [facility])    a. 01/2013=> post cardiac cath CVA to L post communicating artery system; R sided weakness  . Syncope     Relevant past medical,  surgical, family and social history reviewed and updated as indicated. Interim medical history since our last visit reviewed.  Review of Systems Per HPI unless specifically indicated above     Objective:    BP 116/78   Pulse 70   Temp (!) 97.3 F (36.3 C)   Ht _0  (1.727 m)   Wt 179 lb (81.2 kg)   SpO2 98%   BMI 27.22 kg/m   Wt Readings from Last 3 Encounters:  07/31/20 179 lb (81.2 kg)  07/29/20 177 lb (80.3 kg)  07/25/20 180 lb 6.4 oz (81.8 kg)    Physical Exam Vitals and nursing note reviewed.  Constitutional:      General: He is not in acute distress.    Appearance: Normal appearance. He is obese. He is not toxic-appearing.  Cardiovascular:     Rate and Rhythm: Normal rate.  Abdominal:     General: Abdomen is flat.     Palpations: Abdomen is soft. There is no mass.     Tenderness: There is no abdominal tenderness. There is no right CVA tenderness, left CVA tenderness or guarding.  Musculoskeletal:     Cervical back: Normal. No tenderness or bony tenderness. No pain with movement. Normal range of motion.     Thoracic back: Normal. No  spasms, tenderness or bony tenderness. Normal range of motion.     Lumbar back: Normal. No spasms, tenderness or bony tenderness. Normal range of motion.  Skin:    General: Skin is warm.     Coloration: Skin is not jaundiced or pale.     Findings: Rash present. Rash is papular and vesicular.          Comments: Erythematous and vesicular rash noted to right side of truck; rash extends to mid back.  Slightly tender to palpation.  Vesicles are closed; no oozing, drainage, warmth, or odor.  Neurological:     Mental Status: He is alert. Mental status is at baseline.     Gait: Gait abnormal (uses walker).  Psychiatric:        Mood and Affect: Mood normal.        Behavior: Behavior normal.       Assessment & Plan:  1. Herpes zoster without complication Acute.  Onset 1 day ago.  Will start valcyclovir 1 g three times daily for 7 days.  Return to clinic as schedule with PCP.   - valACYclovir (VALTREX) 500 MG tablet; Take 2 tablets (1,000 mg total) by mouth 3 (three) times daily.  Dispense: 21 tablet; Refill: 0  2. Fall, initial encounter Fall occurred more than 2 weeks ago.   Patient is nontender to palpation of his entire rib cage today.  No indications for imaging - explained this to the patient and grand daughter.  I think most of the pain is coming from the shingles infection at this time.      Follow up plan: Return for as scheduled.

## 2020-08-01 ENCOUNTER — Telehealth: Payer: Self-pay | Admitting: Internal Medicine

## 2020-08-01 DIAGNOSIS — I2510 Atherosclerotic heart disease of native coronary artery without angina pectoris: Secondary | ICD-10-CM | POA: Diagnosis not present

## 2020-08-01 DIAGNOSIS — I110 Hypertensive heart disease with heart failure: Secondary | ICD-10-CM | POA: Diagnosis not present

## 2020-08-01 DIAGNOSIS — I69331 Monoplegia of upper limb following cerebral infarction affecting right dominant side: Secondary | ICD-10-CM | POA: Diagnosis not present

## 2020-08-01 DIAGNOSIS — E119 Type 2 diabetes mellitus without complications: Secondary | ICD-10-CM | POA: Diagnosis not present

## 2020-08-01 DIAGNOSIS — M48061 Spinal stenosis, lumbar region without neurogenic claudication: Secondary | ICD-10-CM | POA: Diagnosis not present

## 2020-08-01 DIAGNOSIS — M5116 Intervertebral disc disorders with radiculopathy, lumbar region: Secondary | ICD-10-CM | POA: Diagnosis not present

## 2020-08-01 DIAGNOSIS — E039 Hypothyroidism, unspecified: Secondary | ICD-10-CM | POA: Diagnosis not present

## 2020-08-01 DIAGNOSIS — I480 Paroxysmal atrial fibrillation: Secondary | ICD-10-CM | POA: Diagnosis not present

## 2020-08-01 DIAGNOSIS — I4892 Unspecified atrial flutter: Secondary | ICD-10-CM | POA: Diagnosis not present

## 2020-08-01 DIAGNOSIS — I252 Old myocardial infarction: Secondary | ICD-10-CM | POA: Diagnosis not present

## 2020-08-01 DIAGNOSIS — I5032 Chronic diastolic (congestive) heart failure: Secondary | ICD-10-CM | POA: Diagnosis not present

## 2020-08-01 NOTE — Telephone Encounter (Signed)
Patient's daughter states the patient was diagnosed with shingles and his procedure is 6/2. She also states they are concerned about the patient healing after the procedure. She states he has limited mobility and struggles to go up and down to go to the bathroom. She states she has known several people who had the procedure and were told not to put any pressure on that side. She states the patient's right side is bad from a stroke so he uses a lot of his left side to lift himself. She states he gets up 6-7 times at night to go to the bathroom and puts most of his body weight on his arm to lift himself. She states she is concerned they miscommunication at the patient's appointment and did not express how much weight he puts on his arm. She states the patient is also concerned and mentioned that he may need to got to a facility after the procedure in order to heal safely for a couple weeks. She states she does not want to put him in a facility, but they are very concerned for his safety.

## 2020-08-04 ENCOUNTER — Other Ambulatory Visit: Payer: Self-pay

## 2020-08-04 ENCOUNTER — Telehealth: Payer: Self-pay

## 2020-08-04 DIAGNOSIS — B029 Zoster without complications: Secondary | ICD-10-CM

## 2020-08-04 MED ORDER — VALACYCLOVIR HCL 500 MG PO TABS: 500.0000 mg | ORAL_TABLET | Freq: Three times a day (TID) | ORAL | 0 refills | Status: DC

## 2020-08-04 MED ORDER — VALACYCLOVIR HCL 1 G PO TABS: 1000.0000 mg | ORAL_TABLET | Freq: Three times a day (TID) | ORAL | 0 refills | Status: DC

## 2020-08-04 NOTE — Telephone Encounter (Signed)
Received call from patient daughter [name].   States that she is concerned about the medication as she believes patient took 3 tabs TID.   Call placed to pharmacy. States that insurance is not covering prescription as QL. Also states that prescription was given as 500mg  tabs (2) tabs PO TID. Reports that new prescription given today was 500mg  PO TID.   Discussed with NP. Advised that patient will need 7 days of therapy. States that additional tabs are ok to have been given. Requested family is made aware as to how prescription should be given.   After discussing with patient daughter, confusion noted as to how to give prescription. Also noted that with discount card, they are still paying >$50 for prescription.   GoodRx information given and advised that prescription is >$20 at [facility]. New prescription with correct dosage sent to [facility].

## 2020-08-04 NOTE — Telephone Encounter (Signed)
Valacyclovir 1000 mg three times daily for 7 days is appropriate treatment for shingles.

## 2020-08-05 ENCOUNTER — Other Ambulatory Visit: Payer: Self-pay | Admitting: *Deleted

## 2020-08-05 DIAGNOSIS — R3915 Urgency of urination: Secondary | ICD-10-CM | POA: Diagnosis not present

## 2020-08-05 DIAGNOSIS — N3281 Overactive bladder: Secondary | ICD-10-CM | POA: Diagnosis not present

## 2020-08-05 MED ORDER — TAMSULOSIN HCL 0.4 MG PO CAPS: 0.4000 mg | ORAL_CAPSULE | Freq: Every day | ORAL | 0 refills | Status: DC

## 2020-08-05 NOTE — Telephone Encounter (Signed)
Agree with care advice.  

## 2020-08-06 NOTE — Telephone Encounter (Signed)
Left message for Pt's daughter.  Advised per Dr. Lovena Le- he is NOT concerned about Pt using his arms to get up and down with regards to the pacemaker implant.  Advised covid test was cancelled.  Requested call back if any further concerns or need to reschedule d/t Pt with shingles.

## 2020-08-07 ENCOUNTER — Telehealth: Payer: Self-pay

## 2020-08-08 DIAGNOSIS — I2510 Atherosclerotic heart disease of native coronary artery without angina pectoris: Secondary | ICD-10-CM | POA: Diagnosis not present

## 2020-08-08 DIAGNOSIS — I4892 Unspecified atrial flutter: Secondary | ICD-10-CM | POA: Diagnosis not present

## 2020-08-08 DIAGNOSIS — I69331 Monoplegia of upper limb following cerebral infarction affecting right dominant side: Secondary | ICD-10-CM | POA: Diagnosis not present

## 2020-08-08 DIAGNOSIS — I252 Old myocardial infarction: Secondary | ICD-10-CM | POA: Diagnosis not present

## 2020-08-08 DIAGNOSIS — M5116 Intervertebral disc disorders with radiculopathy, lumbar region: Secondary | ICD-10-CM | POA: Diagnosis not present

## 2020-08-08 DIAGNOSIS — E119 Type 2 diabetes mellitus without complications: Secondary | ICD-10-CM | POA: Diagnosis not present

## 2020-08-08 DIAGNOSIS — E039 Hypothyroidism, unspecified: Secondary | ICD-10-CM | POA: Diagnosis not present

## 2020-08-08 DIAGNOSIS — M48061 Spinal stenosis, lumbar region without neurogenic claudication: Secondary | ICD-10-CM | POA: Diagnosis not present

## 2020-08-08 DIAGNOSIS — I110 Hypertensive heart disease with heart failure: Secondary | ICD-10-CM | POA: Diagnosis not present

## 2020-08-08 DIAGNOSIS — I5032 Chronic diastolic (congestive) heart failure: Secondary | ICD-10-CM | POA: Diagnosis not present

## 2020-08-08 DIAGNOSIS — I480 Paroxysmal atrial fibrillation: Secondary | ICD-10-CM | POA: Diagnosis not present

## 2020-08-09 DIAGNOSIS — I5032 Chronic diastolic (congestive) heart failure: Secondary | ICD-10-CM | POA: Diagnosis not present

## 2020-08-09 DIAGNOSIS — M48061 Spinal stenosis, lumbar region without neurogenic claudication: Secondary | ICD-10-CM | POA: Diagnosis not present

## 2020-08-09 DIAGNOSIS — I110 Hypertensive heart disease with heart failure: Secondary | ICD-10-CM | POA: Diagnosis not present

## 2020-08-09 DIAGNOSIS — E119 Type 2 diabetes mellitus without complications: Secondary | ICD-10-CM | POA: Diagnosis not present

## 2020-08-09 DIAGNOSIS — I69331 Monoplegia of upper limb following cerebral infarction affecting right dominant side: Secondary | ICD-10-CM | POA: Diagnosis not present

## 2020-08-09 DIAGNOSIS — I252 Old myocardial infarction: Secondary | ICD-10-CM | POA: Diagnosis not present

## 2020-08-09 DIAGNOSIS — I4892 Unspecified atrial flutter: Secondary | ICD-10-CM | POA: Diagnosis not present

## 2020-08-09 DIAGNOSIS — I480 Paroxysmal atrial fibrillation: Secondary | ICD-10-CM | POA: Diagnosis not present

## 2020-08-09 DIAGNOSIS — E039 Hypothyroidism, unspecified: Secondary | ICD-10-CM | POA: Diagnosis not present

## 2020-08-09 DIAGNOSIS — M5116 Intervertebral disc disorders with radiculopathy, lumbar region: Secondary | ICD-10-CM | POA: Diagnosis not present

## 2020-08-09 DIAGNOSIS — I2510 Atherosclerotic heart disease of native coronary artery without angina pectoris: Secondary | ICD-10-CM | POA: Diagnosis not present

## 2020-08-12 DIAGNOSIS — I5032 Chronic diastolic (congestive) heart failure: Secondary | ICD-10-CM | POA: Diagnosis not present

## 2020-08-12 DIAGNOSIS — E039 Hypothyroidism, unspecified: Secondary | ICD-10-CM | POA: Diagnosis not present

## 2020-08-12 DIAGNOSIS — I110 Hypertensive heart disease with heart failure: Secondary | ICD-10-CM | POA: Diagnosis not present

## 2020-08-12 DIAGNOSIS — I480 Paroxysmal atrial fibrillation: Secondary | ICD-10-CM | POA: Diagnosis not present

## 2020-08-12 DIAGNOSIS — M5116 Intervertebral disc disorders with radiculopathy, lumbar region: Secondary | ICD-10-CM | POA: Diagnosis not present

## 2020-08-12 DIAGNOSIS — I4892 Unspecified atrial flutter: Secondary | ICD-10-CM | POA: Diagnosis not present

## 2020-08-12 DIAGNOSIS — M48061 Spinal stenosis, lumbar region without neurogenic claudication: Secondary | ICD-10-CM | POA: Diagnosis not present

## 2020-08-12 DIAGNOSIS — E119 Type 2 diabetes mellitus without complications: Secondary | ICD-10-CM | POA: Diagnosis not present

## 2020-08-12 DIAGNOSIS — I2510 Atherosclerotic heart disease of native coronary artery without angina pectoris: Secondary | ICD-10-CM | POA: Diagnosis not present

## 2020-08-12 DIAGNOSIS — I69331 Monoplegia of upper limb following cerebral infarction affecting right dominant side: Secondary | ICD-10-CM | POA: Diagnosis not present

## 2020-08-12 DIAGNOSIS — I252 Old myocardial infarction: Secondary | ICD-10-CM | POA: Diagnosis not present

## 2020-08-15 ENCOUNTER — Ambulatory Visit (INDEPENDENT_AMBULATORY_CARE_PROVIDER_SITE_OTHER): Payer: Medicare Other | Admitting: Family Medicine

## 2020-08-15 DIAGNOSIS — Z7901 Long term (current) use of anticoagulants: Secondary | ICD-10-CM

## 2020-08-15 DIAGNOSIS — I480 Paroxysmal atrial fibrillation: Secondary | ICD-10-CM

## 2020-08-15 NOTE — Telephone Encounter (Signed)
Aware of new arrival time

## 2020-08-15 NOTE — Telephone Encounter (Signed)
Spoke with daughter Maudie Mercury per [place].  Discuss pacemaker implant.  Family will have someone with father during the day and family will plan to spend nights with patient.  Pt is recovered from shingles.  Advised no covid test required.  Advised arrival time has changed to 9:30 am.  Martin Majestic over instruction letter  All questions answered.

## 2020-08-19 ENCOUNTER — Other Ambulatory Visit (HOSPITAL_COMMUNITY): Payer: Medicare Other

## 2020-08-19 ENCOUNTER — Other Ambulatory Visit: Payer: Self-pay | Admitting: Family Medicine

## 2020-08-19 ENCOUNTER — Telehealth: Payer: Self-pay

## 2020-08-19 ENCOUNTER — Telehealth: Payer: Self-pay | Admitting: Cardiology

## 2020-08-19 NOTE — Telephone Encounter (Signed)
New message:     Patient daughter calling to see if some one can call concering her father up coming apt.

## 2020-08-19 NOTE — Telephone Encounter (Signed)
Outreach made to daughter.  Advised that Pt's pacemaker implant was denied by his [employer].  Advised would call when denial had been overturned on appeal.

## 2020-08-19 NOTE — Telephone Encounter (Signed)
Spoke to patient's daughter n law Maudie Mercury calling to review instructions for upcoming pacemaker implant.Instructions reviewed and all questions answered.Advised only 1 visitor allowed.

## 2020-08-21 ENCOUNTER — Other Ambulatory Visit: Payer: Self-pay

## 2020-08-21 ENCOUNTER — Encounter (HOSPITAL_COMMUNITY): Admission: RE | Payer: Medicare Other | Source: Home / Self Care

## 2020-08-21 ENCOUNTER — Ambulatory Visit (INDEPENDENT_AMBULATORY_CARE_PROVIDER_SITE_OTHER): Payer: Medicare Other | Admitting: Family Medicine

## 2020-08-21 ENCOUNTER — Ambulatory Visit (HOSPITAL_COMMUNITY): Admission: RE | Admit: 2020-08-21 | Payer: Medicare Other | Source: Home / Self Care | Admitting: Internal Medicine

## 2020-08-21 VITALS — BP 142/68 | HR 52 | Temp 98.6°F | Resp 18 | Ht 68.0 in | Wt 182.0 lb

## 2020-08-21 DIAGNOSIS — I480 Paroxysmal atrial fibrillation: Secondary | ICD-10-CM

## 2020-08-21 DIAGNOSIS — I2510 Atherosclerotic heart disease of native coronary artery without angina pectoris: Secondary | ICD-10-CM | POA: Diagnosis not present

## 2020-08-21 DIAGNOSIS — I5032 Chronic diastolic (congestive) heart failure: Secondary | ICD-10-CM | POA: Diagnosis not present

## 2020-08-21 DIAGNOSIS — M5116 Intervertebral disc disorders with radiculopathy, lumbar region: Secondary | ICD-10-CM | POA: Diagnosis not present

## 2020-08-21 DIAGNOSIS — Z7901 Long term (current) use of anticoagulants: Secondary | ICD-10-CM

## 2020-08-21 DIAGNOSIS — I252 Old myocardial infarction: Secondary | ICD-10-CM | POA: Diagnosis not present

## 2020-08-21 DIAGNOSIS — E039 Hypothyroidism, unspecified: Secondary | ICD-10-CM | POA: Diagnosis not present

## 2020-08-21 DIAGNOSIS — I110 Hypertensive heart disease with heart failure: Secondary | ICD-10-CM | POA: Diagnosis not present

## 2020-08-21 DIAGNOSIS — E119 Type 2 diabetes mellitus without complications: Secondary | ICD-10-CM | POA: Diagnosis not present

## 2020-08-21 DIAGNOSIS — M48061 Spinal stenosis, lumbar region without neurogenic claudication: Secondary | ICD-10-CM | POA: Diagnosis not present

## 2020-08-21 DIAGNOSIS — I69331 Monoplegia of upper limb following cerebral infarction affecting right dominant side: Secondary | ICD-10-CM | POA: Diagnosis not present

## 2020-08-21 DIAGNOSIS — I4892 Unspecified atrial flutter: Secondary | ICD-10-CM | POA: Diagnosis not present

## 2020-08-21 LAB — PT WITH INR/FINGERSTICK
INR, fingerstick: 1.6 ratio — ABNORMAL HIGH
PT, fingerstick: 19.1 s — ABNORMAL HIGH (ref 10.5–13.1)

## 2020-08-21 SURGERY — PACEMAKER IMPLANT

## 2020-08-21 MED ORDER — RIVAROXABAN 20 MG PO TABS: 20.0000 mg | ORAL_TABLET | Freq: Every day | ORAL | 5 refills | Status: DC

## 2020-08-21 NOTE — Progress Notes (Signed)
Subjective:    Patient ID: [name], male    DOB: [birth year], 87 y.o.   MRN: [id number]  Recently admitted to the hospital: History of present illness:  Daniel Novacek Loftisis a 87 y.o.malewith medical history significant ofafibon Coumadin; CVA; seizure d/o; CAD; hypothyroidism; HTN; HLD; and DM presenting with syncope.They left a week or two ago for a trip to the [place] with family. He was doing well when he left home but that day he noticed difficulty walking. He was having back pain. He thinks he was weak in both legs. They stayed a few days and he just laid around and it got worse. They came home and he was so "demobilized." He was admitted from 4/10-12 "and that's where I've been ever since... I think I was in the hospital and came back home and it didn't get no better so they brought me back in." He didn't get any better at home.   He was admitted for a 35-40 second spell, like passing out, while he was seated.   In review of his last admission, he presented to [facility] on 4/11 for B LE weakness and back pain. He was transferred to [facility] for MRI. CRP and ESR were negative. HTN medications were adjusted (lowered) but to hypotension. He was started on Flomax for his urinary symptoms. MRI brain was without acute problems and there was mild cervical stenosis noted. MRI of the L spine showed inflammatory signal in L1-2 thought to be acute on chronic disc/endplate degeneration with moderate to severe spinal stenosis.   He was admitted with presyncopal episode, low back pain and weakness.  He was seen by neurosurgery in house who did not recommend any surgical interventions at this time.  Hospitalization complicated by high grade AV block, which could be underlying cause for his presyncopal episode.  Metoprolol was discontinued.  Cardiology was consulted.  See below for additional details.  Discharged home in stable condition 4/22.   Hospital Course:  Syncope -Patient with  prior h/o stroke as well as known seizure d/o who presented with transient (<1 minute) episode of altered sensorium/unresponsiveness this AM - this occurred while he was seated ->tele notable for brady events here (high grade AV block). He was on metoprolol, will hold this.  - cardiology c/s, appreciate recs -> discontinued metoprolol.  Discharge home with event monitor.   -EEG without seizure/eplileptiform activity -continue tele monitoring  -negative orthostatics -Troponinsnegative x 2 -Head CT negative -2d echoordered -> EF 64-40%, grade 1 diastolic dysfunction (see report) -Neuro checks  -PT/OT eval and treat  Bradycardia  - tele with pauses ->dropped beats, concerning for mobitz 2/high grade AV block? - hold metoprolol  - also on clonidine, which could contribute, though metop more obvious contributor  - cardiology consulted in setting of concern for high grade AV block/syncope above, appreciate assistance-> metoprolol discontinued, ziopatch placed  Back pain -Recent MRI with apparent severe lumbar spinal stenosis - repeat MRI with unchanged appearance of hyperintense T2 weighted signal within the endplates at H4-7, no contrast enhancement in the disc space. Unchanged multilevel severe spondylosis with spinal canal stenosis worst at L2-3 and L4-5.  -Persistent weakness, difficulty ambulating, urinary incontinence -Neurosurgery consult requested, Dr. Annette Stable to see -> pain control and mobilization with therapy, follow - no neurosurgical intervention recommended - follow outpatient  -Continue Lidoderm, Norco for pain  Urinary incontinence  Urinary Frequency -Daughter reports significant issue with incontinence which is not baseline - this is new over past several weeks -Recently  started on Flomax without improvement -discussed question of NPH with neurosurgery - will follow at this point- no recommendation for intervention at this point follow outpatient  - follow urine  culture, UA borderline, not clearly indicative of UTI, but in setting of new urinary frequncy, certainly could fit -> discussed empiric abx with daughter, risks/benefits -> will treat empirically, needs to follow final cx with PCP  Hematuria - has planned follow up with urology for above, can follow this as well, likely related to coumadin - follow outpatient - could also be related to UTI, follow culture  Afib -Rate controlled -Continue Coumadin - follow INR with PCP outpatient   Seizure d/o -EEG, as above -Continue Carbamazepine and Keppra  Hypothyroidism -ElevatedTSH -Continue Synthroid at current dose for nowbut needs outpatient PCP f/u  HTN -Continue Clonidine, Imdur, Cozaar. Lopressor on hold - follow BP for need for adjustment  HLD -Continue Lipitor  DM -Recent A1c 6.3 indicates good control -hold Glucophage -Cover with moderate-scale SSI  Procedures: Echo IMPRESSIONS    1. Left ventricular ejection fraction, by estimation, is 50 to 55%. The  left ventricle has low normal function. The left ventricle has no regional  wall motion abnormalities. There is mild left ventricular hypertrophy.  Left ventricular diastolic  parameters are consistent with Grade I diastolic dysfunction (impaired  relaxation). Elevated left atrial pressure.  2. Right ventricular systolic function is normal. The right ventricular  size is normal. There is mildly elevated pulmonary artery systolic  pressure.  3. Left atrial size was severely dilated.  4. The mitral valve is normal in structure. Trivial mitral valve  regurgitation. No evidence of mitral stenosis. Moderate mitral annular  calcification.  5. The aortic valve is tricuspid. There is mild calcification of the  aortic valve. There is moderate thickening of the aortic valve. Aortic  valve regurgitation is not visualized. Mild to moderate aortic valve  sclerosis/calcification is present, without  any evidence of  aortic stenosis.  6. There is mild dilatation of the ascending aorta, measuring 39 mm.   EEG IMPRESSION: This study is suggestive of mild diffuse encephalopathy, nonspecific etiology.No seizures or epileptiform discharges were seen throughout the recording.  Consultations:  Cardiology   neurosurgery   07/15/20 Patient is here today for follow-up.  He is here today alone.  He is not certain exactly what happened in the hospital.  Patient has age-related cognitive decline and dementia.  His short-term memory is compromised.  He does not recollect the events of the hospitalization.  Today he states that he is not hurting in his back.  I palpated his spinous processes around L1 and L2 and I am unable to elicit any pain.  He primarily complains of pain in his right leg specifically around his right knee.  He states that whenever he stands up or sits down he has pain in his right knee however I do not believe that this has any relationship to any lumbar radiculopathy.  Instead I believe this is most likely due to osteoarthritis of the right knee.  He is walking using a walker.  He is still taking Coumadin.  He is on 7.5 mg tablets, Tuesday Thursday and Saturday.  He takes 5 mg tablets on Monday, Wednesday, Friday, and Sunday.  INR today is therapeutic at 2.4.  He has not yet called to make an appointment with a back surgeon.  In fact he does not even report having any back pain today.  He states that he is not taking metoprolol.  He denies any syncope or near syncope or chest pain or palpitations.  He is on antibiotics for urinary tract infection.  He denies any dysuria or urgency or frequency.  AT that time, my plan was: History is difficult due to the patient's memory loss and cognitive decline.  He is here today alone.  TSH was abnormal.  I will repeat the TSH now that the patient is out of the hospital to determine if changes need to be made.  INR is therapeutic.  Continue Coumadin at his current  dose and recheck an INR in 4 weeks.  Patient has pain in his right knee.  He has some weakness in his right leg however he is ambulating with a walker.  He declines any pain in his back at the present time and therefore I do not see any need for a surgical referral.  I will repeat a CBC, CMP.  I will also repeat a urine culture as I do not believe the patient has a urinary tract infection.  I believe that his syncope was likely due to metoprolol and bradycardia secondary to that.  He has an appointment to see his cardiologist in early May.  His family is managing his medications for him.  However the biggest issue I see at the present time is his memory loss.  If the situation continues to deteriorate, he and his wife would benefit from assisted living facility placement and medication supervision  08/21/20 Patient missed his last appointment.  He is not certain the dose of Coumadin that he is taking.  Also his INR is subtherapeutic.  For all these reasons, I am concerned that Coumadin is a poor choice for this patient.  I question if he is taking the Coumadin correctly.  He also does not have insight to know how to take the Coumadin due to memory loss.  Also, his wife is battling dementia and is also fighting lung cancer.  I feel that there is just too many variables going on at this time and the situation and I am concerned that his Coumadin is not being taken correctly and this puts him at risk for stroke or unintended bleeding Past Medical History:  Diagnosis Date  . Atrial flutter ([place])   . BPH (benign prostatic hyperplasia)   . Coronary artery disease    a. s/p MI tx with Cypher DES to pRCA in 4/04;  b. Echocardiogram 7/09: Normal LV function.  c. Nuclear study 3/13 no ischemia;  d. ETT 2/14 neg;  e. admx with CP => LHC (01/30/2013):  pLAD 40-50%, oD1 70-80%, oOM1 40%, pRCA stent patent, mid RCA 30%, EF 60-65%. => med Rx.  . DDD (degenerative disc disease)   . Diabetes mellitus without complication  ([place])   . Dyslipidemia   . Gait disorder 07/13/2018  . Hemorrhoids   . Hx MRSA infection    left buttocks abscess  . Hx of echocardiogram    a. Echocardiogram (01/31/2013): Mild focal basal and mild concentric hypertrophy of the septum, EF 50-55%, normal wall motion, grade 1 diastolic dysfunction, trivial AI, MAC, mild LAE, PASP 35  . Hyperlipidemia   . Hypertension   . Hypothyroidism   . Meniere's disease    Status post shunt  . Myocardial infarction ([place]) 2008  . Occlusion and stenosis of carotid artery without mention of cerebral infarction    40-59% on carotid doppler 2014; [place] (01/2013): R 1-39%, L 60-79%  . Rotator cuff injury    chronic rotator cuff  injury status post repair  . Seizure disorder ([place])   . Stroke [facility])    a. 01/2013=> post cardiac cath CVA to L post communicating artery system; R sided weakness  . Syncope    Past Surgical History:  Procedure Laterality Date  . COLONOSCOPY    . CORONARY ANGIOPLASTY WITH STENT PLACEMENT  07/14/2002   Stent to the right coronary artery  . ENDOLYMPHATIC SHUNT DECOMPRESSION  06/11/2009    Right endolymphatic sac decompression and shunt  placement  . HERNIA REPAIR  04/10/2008   scrotal hernia repair  . LEFT HEART CATHETERIZATION WITH CORONARY ANGIOGRAM N/A 01/30/2013   Procedure: LEFT HEART CATHETERIZATION WITH CORONARY ANGIOGRAM;  Surgeon: Larey Dresser, MD;  Location[facility] C[facility] LAB;  Service: Cardiovascular;  Laterality: N/A;  . ROTATOR CUFF REPAIR     for chronic rotator cuff injury   Current Outpatient Medications on File Prior to Visit  Medication Sig Dispense Refill  . acetaminophen (TYLENOL) 325 MG tablet Take 1-2 tablets (325-650 mg total) by mouth every 4 (four) hours as needed for mild pain.    Marland Kitchen albuterol (VENTOLIN HFA) 108 (90 Base) MCG/ACT inhaler Inhale 2 puffs into the lungs every 4 (four) hours as needed for wheezing or shortness of breath. 18 g 11  . atorvastatin (LIPITOR) 80 MG tablet TAKE 1 TABLET BY MOUTH  EVERY DAY AT 6 PM (Patient taking differently: Take 80 mg by mouth daily. TAKE 1 TABLET BY MOUTH EVERY DAY AT 6 PM) 90 tablet 1  . carbamazepine (CARBATROL) 300 MG 12 hr capsule TAKE 1 CAPSULE BY MOUTH TWICE A DAY (Patient taking differently: Take 300 mg by mouth 2 (two) times daily.) 180 capsule 3  . cloNIDine (CATAPRES) 0.2 MG tablet TAKE 1 TABLET(0.2 MG) BY MOUTH TWICE DAILY (Patient taking differently: Take 0.2 mg by mouth 2 (two) times daily.) 180 tablet 3  . escitalopram (LEXAPRO) 5 MG tablet TAKE 1 TABLET BY MOUTH EVERYDAY AT BEDTIME (Patient taking differently: Take 5 mg by mouth at bedtime. TAKE 1 TABLET BY MOUTH EVERYDAY AT BEDTIME) 90 tablet 1  . furosemide (LASIX) 20 MG tablet Take 0.5 tablets (10 mg total) by mouth daily. 45 tablet 2  . glucose blood test strip 1 each by Other route as needed. Use as instructed 100 each 3  . glucose monitoring kit (FREESTYLE) monitoring kit 1 each by Does not apply route as needed. 1 each 0  . HYDROcodone-acetaminophen (NORCO) 5-325 MG tablet Take 1 tablet by mouth every 6 (six) hours as needed for moderate pain. 15 tablet 0  . isosorbide mononitrate (IMDUR) 60 MG 24 hr tablet Take 0.5 tablets (30 mg total) by mouth daily. 90 tablet 3  . Lancets (ACCU-CHEK SAFE-T PRO) lancets 1 each by Other route as needed. Use as instructed 100 each 3  . levETIRAcetam (KEPPRA) 250 MG tablet TAKE 1 TABLET(250 MG) BY MOUTH TWICE DAILY 180 tablet 1  . levothyroxine (SYNTHROID) 75 MCG tablet TAKE 1 TABLET(75 MCG) BY MOUTH DAILY (Patient taking differently: Take 75 mcg by mouth daily before breakfast.) 90 tablet 0  . losartan (COZAAR) 100 MG tablet Take 0.5 tablets (50 mg total) by mouth daily. 90 tablet 1  . Multiple Vitamins-Minerals (MULTIVITAMINS THER. W/MINERALS) TABS Take 1 tablet by mouth at bedtime.      Marland Kitchen MYRBETRIQ 50 MG TB24 tablet Take 50 mg by mouth daily.    . tamsulosin (FLOMAX) 0.4 MG CAPS capsule Take 1 capsule (0.4 mg total) by mouth daily after supper. [place]  capsule 0  . warfarin (COUMADIN) 7.5 MG tablet TAKE 1&1/2 TABS TUESDAY, THURSDAY & SATURDAY*1 TAB ON SUNDAY, MONDAY, WEDNESDAY & FRIDAY WITH SUPPER (Patient taking differently: Take 7.5-11.25 mg by mouth See admin instructions. TAKE 1&1/2 TABS TUESDAY, THURSDAY & SATURDAY*1 TAB ON SUNDAY, MONDAY, WEDNESDAY & FRIDAY WITH SUPPER) 120 tablet 3   No current facility-administered medications on file prior to visit.   Allergies  Allergen Reactions  . Penicillins     Unknown reaction   Social History   Socioeconomic History  . Marital status: Married    Spouse name: Not on file  . Number of children: 4  . Years of education: 12th  . Highest education level: Not on file  Occupational History  . Occupation: Retired  Tobacco Use  . Smoking status: Former Smoker    Years: 5.00    Types: Cigarettes    Quit date: 08/19/1956    Years since quitting: 64.0  . Smokeless tobacco: Former Systems developer    Types: Secondary school teacher  . Vaping Use: Never used  Substance and Sexual Activity  . Alcohol use: No  . Drug use: No  . Sexual activity: Never  Other Topics Concern  . Not on file  Social History Narrative   Lives with wife.   Social Determinants of Health   Financial Resource Strain: Not on file  Food Insecurity: Not on file  Transportation Needs: Not on file  Physical Activity: Not on file  Stress: Not on file  Social Connections: Not on file  Intimate Partner Violence: Not on file     Review of Systems  All other systems reviewed and are negative.      Objective:   Physical Exam Vitals reviewed.  Constitutional:      General: He is not in acute distress.    Appearance: Normal appearance. He is obese. He is not ill-appearing, toxic-appearing or diaphoretic.  Cardiovascular:     Rate and Rhythm: Normal rate and regular rhythm.  Pulmonary:     Effort: Pulmonary effort is normal. No respiratory distress.     Breath sounds: Normal breath sounds. No wheezing, rhonchi or rales.   Musculoskeletal:     Right lower leg: No edema.     Left lower leg: No edema.  Neurological:     Mental Status: He is alert.          Assessment & Plan:  Paroxysmal atrial fibrillation ([place]) - Plan: PT with INR/Fingerstick, PT with INR/Fingerstick  Chronic anticoagulation - Plan: PT with INR/Fingerstick, PT with INR/Fingerstick  Patient is by himself today.  I explained this to him in detail.  We are going to discontinue Coumadin and replace with Xarelto 20 mg a day.  I am choosing Xarelto over Eliquis to avoid missed doses for patient convenience given his dementia.  I believe this is the safest option moving forward

## 2020-08-25 ENCOUNTER — Telehealth: Payer: Self-pay

## 2020-08-25 NOTE — Telephone Encounter (Signed)
The patient daughter would like for someone to call her back about the appeal of the pacemaker denial.

## 2020-08-25 NOTE — Telephone Encounter (Signed)
The patient daughter Maudie Mercury calling to see what the status of medicare paying for the patient pacemaker. She said the doctor office was fighting with medicare to get approval for the patient to get a pacemaker. She states that someone told her his pacemaker placement could be rescheduled on Wednesday but no one has gotten back with her. She would like for Sonia Baller to give her a call back at [phone].

## 2020-08-27 NOTE — Telephone Encounter (Signed)
Call returned to Pt's daughter.  Advised would determine if Pt could have PPM June 10, otherwise holding July 6 for Pt.  Will call daughter back to confirm

## 2020-08-27 NOTE — Telephone Encounter (Signed)
Call returned to Pt's daughter.  Pt scheduled for PPM implant August 29, 2020  Work up complete

## 2020-08-28 NOTE — Pre-Procedure Instructions (Signed)
Instructed patient on the following items: Arrival time 0730 Nothing to eat or drink after midnight No meds AM of procedure Responsible person to drive you home and stay with you for 24 hrs  Have you missed any doses of anti-coagulant Coumadin- last dose 6/7

## 2020-08-29 ENCOUNTER — Other Ambulatory Visit: Payer: Self-pay

## 2020-08-29 ENCOUNTER — Encounter (HOSPITAL_COMMUNITY)
Admission: RE | Disposition: A | Discharge status: Home / Self Care | Payer: Self-pay | Source: Home / Self Care | Attending: Internal Medicine

## 2020-08-29 ENCOUNTER — Ambulatory Visit (HOSPITAL_COMMUNITY)
Admission: RE | Admit: 2020-08-29 | Discharge: 2020-08-29 | Disposition: A | Discharge status: Home / Self Care | Payer: Medicare Other | Attending: Internal Medicine | Admitting: Internal Medicine

## 2020-08-29 ENCOUNTER — Ambulatory Visit (HOSPITAL_COMMUNITY): Payer: Medicare Other

## 2020-08-29 DIAGNOSIS — I10 Essential (primary) hypertension: Secondary | ICD-10-CM | POA: Insufficient documentation

## 2020-08-29 DIAGNOSIS — I441 Atrioventricular block, second degree: Secondary | ICD-10-CM | POA: Diagnosis not present

## 2020-08-29 DIAGNOSIS — S2231XA Fracture of one rib, right side, initial encounter for closed fracture: Secondary | ICD-10-CM | POA: Diagnosis not present

## 2020-08-29 DIAGNOSIS — Z880 Allergy status to penicillin: Secondary | ICD-10-CM | POA: Diagnosis not present

## 2020-08-29 DIAGNOSIS — I4892 Unspecified atrial flutter: Secondary | ICD-10-CM | POA: Diagnosis not present

## 2020-08-29 DIAGNOSIS — Z87891 Personal history of nicotine dependence: Secondary | ICD-10-CM | POA: Diagnosis not present

## 2020-08-29 DIAGNOSIS — Z79890 Hormone replacement therapy (postmenopausal): Secondary | ICD-10-CM | POA: Diagnosis not present

## 2020-08-29 DIAGNOSIS — Z7901 Long term (current) use of anticoagulants: Secondary | ICD-10-CM | POA: Insufficient documentation

## 2020-08-29 DIAGNOSIS — Z79899 Other long term (current) drug therapy: Secondary | ICD-10-CM | POA: Insufficient documentation

## 2020-08-29 DIAGNOSIS — Z950 Presence of cardiac pacemaker: Secondary | ICD-10-CM | POA: Diagnosis not present

## 2020-08-29 HISTORY — PX: PACEMAKER IMPLANT: EP1218

## 2020-08-29 LAB — BASIC METABOLIC PANEL
Anion gap: 8 (ref 5–15)
BUN: 19 mg/dL (ref 8–23)
CO2: 26 mmol/L (ref 22–32)
Calcium: 9.1 mg/dL (ref 8.9–10.3)
Chloride: 100 mmol/L (ref 98–111)
Creatinine, Ser: 0.93 mg/dL (ref 0.61–1.24)
GFR, Estimated: >60 mL/min (ref >60)
Glucose, Bld: 120 mg/dL — ABNORMAL HIGH (ref 70–99)
Potassium: 3.9 mmol/L (ref 3.5–5.1)
Sodium: 134 mmol/L — ABNORMAL LOW (ref 135–145)

## 2020-08-29 LAB — CBC
HCT: 34.8 % — ABNORMAL LOW (ref 39.0–52.0)
Hemoglobin: 11.6 g/dL — ABNORMAL LOW (ref 13.0–17.0)
MCH: 32.1 pg (ref 26.0–34.0)
MCHC: 33.3 g/dL (ref 30.0–36.0)
MCV: 96.4 fL (ref 80.0–100.0)
Platelets: 257 10*3/uL (ref 150–400)
RBC: 3.61 MIL/uL — ABNORMAL LOW (ref 4.22–5.81)
RDW: 16.5 % — ABNORMAL HIGH (ref 11.5–15.5)
WBC: 6.6 10*3/uL (ref 4.0–10.5)
nRBC: 0 % (ref 0.0–0.2)

## 2020-08-29 LAB — GLUCOSE, CAPILLARY: Glucose-Capillary: 110 mg/dL — ABNORMAL HIGH (ref 70–99)

## 2020-08-29 LAB — PROTIME-INR
INR: 1 (ref 0.8–1.2)
Prothrombin Time: 13.3 seconds (ref 11.4–15.2)

## 2020-08-29 SURGERY — PACEMAKER IMPLANT

## 2020-08-29 MED ORDER — SODIUM CHLORIDE 0.9 % IV SOLN
INTRAVENOUS | Status: AC
  Filled 2020-08-29: qty 2

## 2020-08-29 MED ORDER — HEPARIN (PORCINE) IN NACL 1000-0.9 UT/500ML-% IV SOLN
INTRAVENOUS | Status: DC | PRN
  Administered 2020-08-29 (×2): 500 mL

## 2020-08-29 MED ORDER — HYDRALAZINE HCL 20 MG/ML IJ SOLN
INTRAMUSCULAR | Status: AC
  Filled 2020-08-29: qty 1

## 2020-08-29 MED ORDER — FENTANYL CITRATE (PF) 100 MCG/2ML IJ SOLN
INTRAMUSCULAR | Status: AC
  Filled 2020-08-29: qty 2

## 2020-08-29 MED ORDER — HYDRALAZINE HCL 20 MG/ML IJ SOLN
INTRAMUSCULAR | Status: DC | PRN
  Administered 2020-08-29: 5 mg via INTRAVENOUS

## 2020-08-29 MED ORDER — SODIUM CHLORIDE 0.9 % IV SOLN
80.0000 mg | INTRAVENOUS | Status: AC
  Administered 2020-08-29: 80 mg

## 2020-08-29 MED ORDER — LIDOCAINE HCL (PF) 1 % IJ SOLN
INTRAMUSCULAR | Status: AC
  Filled 2020-08-29: qty 60

## 2020-08-29 MED ORDER — HEPARIN (PORCINE) IN NACL 1000-0.9 UT/500ML-% IV SOLN
INTRAVENOUS | Status: AC
  Filled 2020-08-29: qty 500

## 2020-08-29 MED ORDER — LIDOCAINE HCL (PF) 1 % IJ SOLN
INTRAMUSCULAR | Status: DC | PRN
  Administered 2020-08-29: 45 mL

## 2020-08-29 MED ORDER — VANCOMYCIN HCL IN DEXTROSE 1-5 GM/200ML-% IV SOLN
1000.0000 mg | INTRAVENOUS | Status: AC
  Administered 2020-08-29: 1000 mg via INTRAVENOUS

## 2020-08-29 MED ORDER — POVIDONE-IODINE 10 % EX SWAB: 2.0000 "application " | Freq: Once | CUTANEOUS | Status: DC

## 2020-08-29 MED ORDER — ACETAMINOPHEN 325 MG PO TABS: 325.0000 mg | ORAL_TABLET | ORAL | Status: DC | PRN

## 2020-08-29 MED ORDER — ONDANSETRON HCL 4 MG/2ML IJ SOLN: 4.0000 mg | Freq: Four times a day (QID) | INTRAMUSCULAR | Status: DC | PRN

## 2020-08-29 MED ORDER — FENTANYL CITRATE (PF) 100 MCG/2ML IJ SOLN
INTRAMUSCULAR | Status: DC | PRN
  Administered 2020-08-29: 12.5 ug via INTRAVENOUS

## 2020-08-29 MED ORDER — SODIUM CHLORIDE 0.9 % IV SOLN: INTRAVENOUS | Status: DC

## 2020-08-29 MED ORDER — CHLORHEXIDINE GLUCONATE 4 % EX LIQD
4.0000 "application " | Freq: Once | CUTANEOUS | Status: DC
  Filled 2020-08-29: qty 60

## 2020-08-29 MED ORDER — MIDAZOLAM HCL 5 MG/5ML IJ SOLN
INTRAMUSCULAR | Status: AC
  Filled 2020-08-29: qty 5

## 2020-08-29 MED ORDER — VANCOMYCIN HCL IN DEXTROSE 1-5 GM/200ML-% IV SOLN
INTRAVENOUS | Status: AC
  Filled 2020-08-29: qty 200

## 2020-08-29 MED ORDER — MIDAZOLAM HCL 5 MG/5ML IJ SOLN
INTRAMUSCULAR | Status: DC | PRN
  Administered 2020-08-29: 1 mg via INTRAVENOUS

## 2020-08-29 SURGICAL SUPPLY — 12 items
CABLE SURGICAL S-101-97-12 (CABLE) ×3 IMPLANT
CATH RIGHTSITE C315HIS02 (CATHETERS) ×2 IMPLANT
IPG PACE AZUR XT DR MRI W1DR01 (Pacemaker) IMPLANT
LEAD CAPSURE NOVUS 5076-52CM (Lead) ×2 IMPLANT
LEAD SELECT SECURE 3830 383069 (Lead) IMPLANT
MAT PREVALON FULL STRYKER (MISCELLANEOUS) ×2 IMPLANT
PACE AZURE XT DR MRI W1DR01 (Pacemaker) ×3 IMPLANT
PAD PRO RADIOLUCENT 2001M-C (PAD) ×3 IMPLANT
SELECT SECURE 3830 383069 (Lead) ×3 IMPLANT
SHEATH 7FR PRELUDE SNAP 13 (SHEATH) ×4 IMPLANT
TRAY PACEMAKER INSERTION (PACKS) ×3 IMPLANT
WIRE HI TORQ VERSACORE-J 145CM (WIRE) ×2 IMPLANT

## 2020-08-29 NOTE — Discharge Instructions (Signed)
    Supplemental Discharge Instructions for  Pacemaker/Defibrillator Patients  Tomorrow, 08/30/20, send in a device transmission  Activity No heavy lifting or vigorous activity with your left/right arm for 6 to 8 weeks.  Do not raise your left/right arm above your head for one week.  Gradually raise your affected arm as drawn below.              09/05/20                    09/06/20                   09/07/20                 09/08/20 __  NO DRIVING for  1 week   ; you may begin driving on   8/93/81  .  WOUND CARE Keep the wound area clean and dry.  Do not get this area wet , no showers until cleared to at your wound check visit  . Tomorrow, 08/30/20, remove the arm sling             Tomorrow, 08/30/20 remove the outer plastic bandage.  Underneath the plastic bandage there are steri strips (paper tapes), DO NOT remove these. The tape/steri-strips on your wound will fall off; do not pull them off.  No bandage is needed on the site.  DO  NOT apply any creams, oils, or ointments to the wound area. If you notice any drainage or discharge from the wound, any swelling or bruising at the site, or you develop a fever > 101? F after you are discharged home, call the office at once.  Special Instructions You are still able to use cellular telephones; use the ear opposite the side where you have your pacemaker/defibrillator.  Avoid carrying your cellular phone near your device. When traveling through airports, show security personnel your identification card to avoid being screened in the metal detectors.  Ask the security personnel to use the hand wand. Avoid arc welding equipment, MRI testing (magnetic resonance imaging), TENS units (transcutaneous nerve stimulators).  Call the office for questions about other devices. Avoid electrical appliances that are in poor condition or are not properly grounded. Microwave ovens are safe to be near or to operate.

## 2020-08-29 NOTE — H&P (Signed)
    HPI [name] is referred by Dr. Hochrein for evaluation of heart block. He is a pleasant 87 yo man with syncope, near syncope and dizziness. He wore a cardiac monitor which demonstrated heart block with over 4 second daytime pauses. The patient is not on any AV nodal blocking drugs. He denies chest pain or sob.  Allergies  Allergen Reactions  . Penicillins     Unknown reaction     Current Outpatient Medications  Medication Sig Dispense Refill  . acetaminophen (TYLENOL) 325 MG tablet Take 1-2 tablets (325-650 mg total) by mouth every 4 (four) hours as needed for mild pain.    . albuterol (VENTOLIN HFA) 108 (90 Base) MCG/ACT inhaler Inhale 2 puffs into the lungs every 4 (four) hours as needed for wheezing or shortness of breath. 18 g 11  . atorvastatin (LIPITOR) 80 MG tablet TAKE 1 TABLET BY MOUTH EVERY DAY AT 6 PM 90 tablet 1  . carbamazepine (CARBATROL) 300 MG 12 hr capsule TAKE 1 CAPSULE BY MOUTH TWICE A DAY 180 capsule 3  . cloNIDine (CATAPRES) 0.2 MG tablet TAKE 1 TABLET(0.2 MG) BY MOUTH TWICE DAILY 180 tablet 3  . escitalopram (LEXAPRO) 5 MG tablet TAKE 1 TABLET BY MOUTH EVERYDAY AT BEDTIME 90 tablet 1  . furosemide (LASIX) 20 MG tablet Take 0.5 tablets (10 mg total) by mouth daily. 45 tablet 2  . glucose blood test strip 1 each by Other route as needed. Use as instructed 100 each 3  . glucose monitoring kit (FREESTYLE) monitoring kit 1 each by Does not apply route as needed. 1 each 0  . HYDROcodone-acetaminophen (NORCO) 5-325 MG tablet Take 1 tablet by mouth every 6 (six) hours as needed for moderate pain. 15 tablet 0  . isosorbide mononitrate (IMDUR) 60 MG 24 hr tablet Take 0.5 tablets (30 mg total) by mouth daily. 90 tablet 3  . Lancets (ACCU-CHEK SAFE-T PRO) lancets 1 each by Other route as needed. Use as instructed 100 each 3  . levETIRAcetam (KEPPRA) 250 MG tablet Take 1 tablet (250 mg total) by mouth 2 (two) times daily. 180 tablet 1  . levothyroxine (SYNTHROID) 75 MCG  tablet TAKE 1 TABLET(75 MCG) BY MOUTH DAILY 90 tablet 0  . losartan (COZAAR) 100 MG tablet Take 0.5 tablets (50 mg total) by mouth daily. 90 tablet 1  . Multiple Vitamins-Minerals (MULTIVITAMINS THER. W/MINERALS) TABS Take 1 tablet by mouth at bedtime.      . tamsulosin (FLOMAX) 0.4 MG CAPS capsule Take 1 capsule (0.4 mg total) by mouth daily after supper. 30 capsule 0  . warfarin (COUMADIN) 7.5 MG tablet TAKE 1&1/2 TABS TUESDAY, THURSDAY & SATURDAY*1 TAB ON SUNDAY, MONDAY, WEDNESDAY & FRIDAY WITH SUPPER 120 tablet 3   No current facility-administered medications for this visit.     Past Medical History:  Diagnosis Date  . Atrial flutter (HCC)   . BPH (benign prostatic hyperplasia)   . Coronary artery disease    a. s/p MI tx with Cypher DES to pRCA in 4/04;  b. Echocardiogram 7/09: Normal LV function.  c. Nuclear study 3/13 no ischemia;  d. ETT 2/14 neg;  e. admx with CP => LHC (01/30/2013):  pLAD 40-50%, oD1 70-80%, oOM1 40%, pRCA stent patent, mid RCA 30%, EF 60-65%. => med Rx.  . DDD (degenerative disc disease)   . Diabetes mellitus without complication (HCC)   . Dyslipidemia   . Gait disorder 07/13/2018  . Hemorrhoids   . Hx MRSA infection      left buttocks abscess  . Hx of echocardiogram    a. Echocardiogram (01/31/2013): Mild focal basal and mild concentric hypertrophy of the septum, EF 50-55%, normal wall motion, grade 1 diastolic dysfunction, trivial AI, MAC, mild LAE, PASP 35  . Hyperlipidemia   . Hypertension   . Hypothyroidism   . Meniere's disease    Status post shunt  . Myocardial infarction (HCC) 2008  . Occlusion and stenosis of carotid artery without mention of cerebral infarction    40-59% on carotid doppler 2014; [place] (01/2013): R 1-39%, L 60-79%  . Rotator cuff injury    chronic rotator cuff injury status post repair  . Seizure disorder (HCC)   . Stroke (HCC)    a. 01/2013=> post cardiac cath CVA to L post communicating artery system; R sided weakness  . Syncope      ROS:   All systems reviewed and negative except as noted in the HPI.   Past Surgical History:  Procedure Laterality Date  . COLONOSCOPY    . CORONARY ANGIOPLASTY WITH STENT PLACEMENT  07/14/2002   Stent to the right coronary artery  . ENDOLYMPHATIC SHUNT DECOMPRESSION  06/11/2009    Right endolymphatic sac decompression and shunt  placement  . HERNIA REPAIR  04/10/2008   scrotal hernia repair  . LEFT HEART CATHETERIZATION WITH CORONARY ANGIOGRAM N/A 01/30/2013   Procedure: LEFT HEART CATHETERIZATION WITH CORONARY ANGIOGRAM;  Surgeon: Daniel S McLean, MD;  Location: [facility] CATH LAB;  Service: Cardiovascular;  Laterality: N/A;  . ROTATOR CUFF REPAIR     for chronic rotator cuff injury     Family History  Problem Relation Age of Onset  . Heart attack Father 71  . Aneurysm Mother        brain aneurysm  . Coronary artery disease Brother        with CABG  . Diabetes Brother   . Colon cancer Neg Hx   . Colon polyps Neg Hx      Social History   Socioeconomic History  . Marital status: Married    Spouse name: Not on file  . Number of children: 4  . Years of education: 12th  . Highest education level: Not on file  Occupational History  . Occupation: Retired  Tobacco Use  . Smoking status: Former Smoker    Years: 5.00    Types: Cigarettes    Quit date: 08/19/1956    Years since quitting: 63.9  . Smokeless tobacco: Former User    Types: Chew  Vaping Use  . Vaping Use: Never used  Substance and Sexual Activity  . Alcohol use: No  . Drug use: No  . Sexual activity: Never  Other Topics Concern  . Not on file  Social History Narrative   Lives with wife.   Social Determinants of Health   Financial Resource Strain: Not on file  Food Insecurity: Not on file  Transportation Needs: Not on file  Physical Activity: Not on file  Stress: Not on file  Social Connections: Not on file  Intimate Partner Violence: Not on file     BP (!) 160/70   Pulse 64   Ht 5' 8"  (1.727 m)   Wt 177 lb (80.3 kg)   SpO2 98%   BMI 26.91 kg/m   Physical Exam:  Well appearing NAD HEENT: Unremarkable Neck:  No JVD, no thyromegally Lymphatics:  No adenopathy Back:  No CVA tenderness Lungs:  Clear with no wheezes HEART:  Regular rate rhythm, no murmurs, no rubs,   no clicks Abd:  soft, positive bowel sounds, no organomegally, no rebound, no guarding Ext:  2 plus pulses, no edema, no cyanosis, no clubbing Skin:  No rashes no nodules Neuro:  CN II through XII intact, motor grossly intact  EKG - reviewed. NSR with marked first degree AV block and an IVCD   Assess/Plan: 1. Heart block - he is symptomatic. I have discussed the treatment options in detail and recommended an insertion of a DDD PM. I have reveiwed the indications/risks/benefits/goals/expectations and he wishes to proceed. 2. Atrial flutter - he is currently maintaining NSR and will undergo watchful waiting. 3. HTN - his SBP is elevated.  4. Coags - he has been intolerant of systemic anti-coagulation previously.  Shahidah Nesbitt,MD 

## 2020-09-01 ENCOUNTER — Telehealth: Payer: Self-pay

## 2020-09-01 ENCOUNTER — Encounter (HOSPITAL_COMMUNITY): Payer: Self-pay | Admitting: Internal Medicine

## 2020-09-01 NOTE — Telephone Encounter (Signed)
The patient daughter called stating the hospital told them the opposite of what Dr. Lovena Le and the nurses her told her. She wants to know when mr Kaatz can use his walker. The hospital told them he can not use it until June 17 th. I let her speak with Jenny Reichmann, rn.

## 2020-09-01 NOTE — Telephone Encounter (Signed)
Spoke with daughter Maudie Mercury [facility]). Education done on LUE lifting, pulling and pushing restrictions of < 10 lbs for the next 6 weeks. She reports that patient has residual right sided weakness from previous stroke and will need to put full weight on LUE to get up from a chair or out of bed. She reports that he will need to have someone with him 2 /7 due to the weakness in BLE. Occlusive dressing removed from wound site, sling not in use, steri-strips dry and intact. Education done on calling the DC for increased edema, any drainage, bleeding , or if the patient develops a fever or chills. She verbalized understanding of arm restrictions and confirmed that monitor plugged in next bed and f/u appointment. Provided with device clinic appointment for any future questions or concerns.

## 2020-09-02 ENCOUNTER — Ambulatory Visit: Payer: Medicare Other

## 2020-09-02 ENCOUNTER — Other Ambulatory Visit: Payer: Self-pay | Admitting: Family Medicine

## 2020-09-03 ENCOUNTER — Other Ambulatory Visit: Payer: Self-pay | Admitting: Family Medicine

## 2020-09-11 ENCOUNTER — Ambulatory Visit (INDEPENDENT_AMBULATORY_CARE_PROVIDER_SITE_OTHER): Payer: Medicare Other

## 2020-09-11 ENCOUNTER — Other Ambulatory Visit: Payer: Self-pay

## 2020-09-11 DIAGNOSIS — I441 Atrioventricular block, second degree: Secondary | ICD-10-CM | POA: Diagnosis not present

## 2020-09-11 LAB — CUP PACEART INCLINIC DEVICE CHECK
Battery Remaining Longevity: 142 mo
Battery Voltage: 3.19 V
Brady Statistic AP VP Percent: 4.73 %
Brady Statistic AP VS Percent: 0 %
Brady Statistic AS VP Percent: 95.2 %
Brady Statistic AS VS Percent: 0.07 %
Brady Statistic RA Percent Paced: 4.75 %
Brady Statistic RV Percent Paced: 99.93 %
Date Time Interrogation Session: 20220623102425
Implantable Lead Implant Date: 20220610
Implantable Lead Implant Date: 20220610
Implantable Lead Location: 753859
Implantable Lead Location: 753860
Implantable Lead Model: 3830
Implantable Lead Model: 5076
Implantable Pulse Generator Implant Date: 20220610
Lead Channel Impedance Value: 304 Ohm
Lead Channel Impedance Value: 361 Ohm
Lead Channel Impedance Value: 475 Ohm
Lead Channel Impedance Value: 532 Ohm
Lead Channel Pacing Threshold Amplitude: 0.375 V
Lead Channel Pacing Threshold Amplitude: 0.75 V
Lead Channel Pacing Threshold Pulse Width: 0.4 ms
Lead Channel Pacing Threshold Pulse Width: 0.4 ms
Lead Channel Sensing Intrinsic Amplitude: 10.375 mV
Lead Channel Sensing Intrinsic Amplitude: 2.25 mV
Lead Channel Setting Pacing Amplitude: 3.5 V
Lead Channel Setting Pacing Amplitude: 3.5 V
Lead Channel Setting Pacing Pulse Width: 0.4 ms
Lead Channel Setting Sensing Sensitivity: 1.2 mV

## 2020-09-11 NOTE — Progress Notes (Signed)
After Care packet given to patient/family with verbal understanding.   Wound check appointment. Steri-strips removed. Wound without redness or edema. Incision edges approximated, wound well healed. Normal device function. Thresholds, sensing, and impedances consistent with implant measurements. Device programmed at 3.5V/auto capture programmed on for extra safety margin until 3 month visit. Histogram distribution appropriate for patient and level of activity. No mode switches or high ventricular rates noted. Patient educated about wound care, arm mobility, lifting restrictions. ROV in 3 months with implanting physician.

## 2020-11-03 NOTE — Progress Notes (Deleted)
Cardiology Office Note   Date:  11/03/2020   ID:  [name], DOB [birth year], MRN [id number]  PCP:  [name], MD  Cardiologist:   Minus Breeding, MD   No chief complaint on file.     History of Present Illness: [name] is a 87 y.o. male who presents for follow up of CAD and CVA following a cath in 2014.  He has also had atrial flutter/fibrillation.   He was admitted in early April with he had lower extremity weakness.  MRI demonstrated spinal stenosis.  He was thought to be dehydrated.  He was admitted a couple of days later with syncope.  The etiology was not clear.  He was on a beta-blocker which she was not supposed to be taking and this was discontinued.  He has been wearing a monitor.  He has been in normal sinus rhythm.  He has had episodes of ventricular asystole up to 4.5 seconds.  He was not symptomatic.   He did have an echocardiogram with a low normal ejection fraction.    He is now status post PPM.    ***  ***  Since going home he has been walking with a walker.  He continues to have weakness and leg tiredness.  They are very concerned about his normal pressure hydrocephalus which may believe they have been told he needs a shunt.  I did go back and look for some old neurology notes from 2020.  Although he has not been seen in a couple of years.    This was from Dr. Jannifer Franklin.  There was a question of potentially doing a VP shunt.  However, he was not sure the benefit of this.    Since going home he has not had any further episodes of near syncope which have been going on for some time.  He does not notice any palpitations.  He is not having any new chest pressure, neck or arm discomfort.  He has had no new shortness of breath, PND or orthopnea.   Past Medical History:  Diagnosis Date   Atrial flutter ([place])    BPH (benign prostatic hyperplasia)    Coronary artery disease    a. s/p MI tx with Cypher DES to pRCA in 4/04;  b. Echocardiogram 7/09: Normal  LV function.  c. Nuclear study 3/13 no ischemia;  d. ETT 2/14 neg;  e. admx with CP => LHC (01/30/2013):  pLAD 40-50%, oD1 70-80%, oOM1 40%, pRCA stent patent, mid RCA 30%, EF 60-65%. => med Rx.   DDD (degenerative disc disease)    Diabetes mellitus without complication ([place])    Dyslipidemia    Gait disorder 07/13/2018   Hemorrhoids    Hx MRSA infection    left buttocks abscess   Hx of echocardiogram    a. Echocardiogram (01/31/2013): Mild focal basal and mild concentric hypertrophy of the septum, EF 50-55%, normal wall motion, grade 1 diastolic dysfunction, trivial AI, MAC, mild LAE, PASP 35   Hyperlipidemia    Hypertension    Hypothyroidism    Meniere's disease    Status post shunt   Myocardial infarction [facility]) 2008   Occlusion and stenosis of carotid artery without mention of cerebral infarction    40-59% on carotid doppler 2014; [place] (01/2013): R 1-39%, L 60-79%   Rotator cuff injury    chronic rotator cuff injury status post repair   Seizure disorder ([place])    Stroke ([place])    a. 01/2013=> post  cardiac cath CVA to L post communicating artery system; R sided weakness   Syncope     Past Surgical History:  Procedure Laterality Date   COLONOSCOPY     CORONARY ANGIOPLASTY WITH STENT PLACEMENT  07/14/2002   Stent to the right coronary artery   ENDOLYMPHATIC SHUNT DECOMPRESSION  06/11/2009    Right endolymphatic sac decompression and shunt  placement   HERNIA REPAIR  04/10/2008   scrotal hernia repair   LEFT HEART CATHETERIZATION WITH CORONARY ANGIOGRAM N/A 01/30/2013   Procedure: LEFT HEART CATHETERIZATION WITH CORONARY ANGIOGRAM;  Surgeon: Larey Dresser, MD;  Location: [facility] CATH LAB;  Service: Cardiovascular;  Laterality: N/A;   PACEMAKER IMPLANT N/A 08/29/2020   Procedure: PACEMAKER IMPLANT;  Surgeon: [name], MD;  Location: [place] CV LAB;  Service: Cardiovascular;  Laterality: N/A;   ROTATOR CUFF REPAIR     for chronic rotator cuff injury     Current Outpatient  Medications  Medication Sig Dispense Refill   acetaminophen (TYLENOL) 325 MG tablet Take 1-2 tablets (325-650 mg total) by mouth every 4 (four) hours as needed for mild pain.     albuterol (VENTOLIN HFA) 108 (90 Base) MCG/ACT inhaler Inhale 2 puffs into the lungs every 4 (four) hours as needed for wheezing or shortness of breath. 18 g 11   atorvastatin (LIPITOR) 80 MG tablet TAKE 1 TABLET BY MOUTH EVERY DAY AT 6 PM 90 tablet 1   carbamazepine (CARBATROL) 300 MG 12 hr capsule TAKE 1 CAPSULE BY MOUTH TWICE A DAY 180 capsule 3   cloNIDine (CATAPRES) 0.2 MG tablet TAKE 1 TABLET(0.2 MG) BY MOUTH TWICE DAILY 180 tablet 3   escitalopram (LEXAPRO) 5 MG tablet TAKE 1 TABLET BY MOUTH EVERY DAY AT BEDTIME 90 tablet 1   furosemide (LASIX) 20 MG tablet Take 0.5 tablets (10 mg total) by mouth daily. 45 tablet 2   glucose blood test strip 1 each by Other route as needed. Use as instructed 100 each 3   glucose monitoring kit (FREESTYLE) monitoring kit 1 each by Does not apply route as needed. 1 each 0   HYDROcodone-acetaminophen (NORCO) 5-325 MG tablet Take 1 tablet by mouth every 6 (six) hours as needed for moderate pain. 15 tablet 0   isosorbide mononitrate (IMDUR) 60 MG 24 hr tablet Take 0.5 tablets (30 mg total) by mouth daily. 90 tablet 3   Lancets (ACCU-CHEK SAFE-T PRO) lancets 1 each by Other route as needed. Use as instructed 100 each 3   levETIRAcetam (KEPPRA) 250 MG tablet TAKE 1 TABLET(250 MG) BY MOUTH TWICE DAILY 180 tablet 1   levothyroxine (SYNTHROID) 75 MCG tablet TAKE 1 TABLET(75 MCG) BY MOUTH DAILY 90 tablet 0   losartan (COZAAR) 100 MG tablet Take 0.5 tablets (50 mg total) by mouth daily. 90 tablet 1   Multiple Vitamins-Minerals (MULTIVITAMINS THER. W/MINERALS) TABS Take 1 tablet by mouth at bedtime.       MYRBETRIQ 50 MG TB24 tablet Take 50 mg by mouth daily.     rivaroxaban (XARELTO) 20 MG TABS tablet Take 1 tablet (20 mg total) by mouth daily with supper. Stop coumadin (warfarin) 30 tablet 5    tamsulosin (FLOMAX) 0.4 MG CAPS capsule Take 1 capsule (0.4 mg total) by mouth daily after supper. 90 capsule 0   No current facility-administered medications for this visit.    Allergies:   Penicillins     ROS:  Please see the history of present illness.   Otherwise, review of systems are positive  for ***.   All other systems are reviewed and negative.    PHYSICAL EXAM: VS:  There were no vitals taken for this visit. , BMI There is no height or weight on file to calculate BMI. GENERAL:  Well appearing NECK:  No jugular venous distention, waveform within normal limits, carotid upstroke brisk and symmetric, no bruits, no thyromegaly LUNGS:  Clear to auscultation bilaterally CHEST:  Well healed sternotomy scar. HEART:  PMI not displaced or sustained,S1 and S2 within normal limits, no S3, no S4, no clicks, no rubs, *** murmurs ABD:  Flat, positive bowel sounds normal in frequency in pitch, no bruits, no rebound, no guarding, no midline pulsatile mass, no hepatomegaly, no splenomegaly EXT:  2 plus pulses throughout, no edema, no cyanosis no clubbing    ***GENERAL:  Well appearing NECK:  No jugular venous distention, waveform within normal limits, carotid upstroke brisk and symmetric, no bruits, no thyromegaly LUNGS:  Clear to auscultation bilaterally CHEST:  Unremarkable HEART:  PMI not displaced or sustained,S1 and S2 within normal limits, no S3, no S4, no clicks, no rubs, 3 out of 6 apical systolic murmur slightly radiating at the aortic outflow tract, no diastolic murmurs ABD:  Flat, positive bowel sounds normal in frequency in pitch, no bruits, no rebound, no guarding, no midline pulsatile mass, no hepatomegaly, no splenomegaly EXT:  2 plus pulses throughout, no edema, no cyanosis no clubbing    EKG:  EKG is *** ordered today. Sinus rhythm, rate ***, first-degree AV block, interventricular conduction delay, left axis deviation   Recent Labs: 07/10/2020: Magnesium  2.0 07/15/2020: ALT 17; TSH 3.41 08/29/2020: BUN 19; Creatinine, Ser 0.93; Hemoglobin 11.6; Platelets 257; Potassium 3.9; Sodium 134    Lipid Panel    Component Value Date/Time   CHOL 179 05/30/2018 0440   TRIG 131 05/30/2018 0440   HDL 50 05/30/2018 0440   CHOLHDL 3.6 05/30/2018 0440   VLDL 26 05/30/2018 0440   LDLCALC 103 (H) 05/30/2018 0440      Wt Readings from Last 3 Encounters:  08/29/20 174 lb (78.9 kg)  08/21/20 182 lb (82.6 kg)  07/31/20 179 lb (81.2 kg)      Other studies Reviewed: Additional studies/ records that were reviewed today include: *** Review of the above records demonstrates:  Please see elsewhere in the note.     ASSESSMENT AND PLAN:  Atrial flutter with bradycardia:   [name] has a CHA2DS2 - VASc score of 7.  ***  He tolerates anticoagulation.   Because of previous drug interactions he cannot be on DOAC.    He currently is in sinus rhythm.    He will continue anticoagulation.  Syncope:   He is now status post PPM.  ***  Given this and his bradycardic episodes on monitor off beta-blockers I will refer him to EP for consideration of pacing.    I had a long discussion with the patient and his daughter about this.  I cannot be sure that his symptoms are related to this bradycardia arrhythmia but now that he is still demonstrating this high-grade block without being on a beta-blocker I think pacing needs to be a consideration.  Leg weakness:   ***  This is not explained necessarily by his bradycardia.  He does have some spinal stenosis.  They are very concerned about the normal pressure hydrocephalus.  I assured him that I would not be the one making this decision but did have suggested that he go back to  Dr. Jannifer Franklin.  If it is felt that this procedure is necessary I do not see an absolute cardiac contraindication and I do think he could tolerate being off the warfarin for this.    Hypertension:   Blood pressure is *** elevated.  However, he was very  anxious.  His blood pressure was not high and the hospital when I was taking care of him.  They are going to keep a blood pressure diary and we might need to titrate his medications avoiding chronotropic negative agents if his blood pressure remains high.   Chronic diastolic heart failure:  ***  He seems to be euvolemic.  No change in therapy.   CAD:  ***  He has had no symptoms consistent with acute coronary syndrome or active ischemia.  The patient has no new sypmtoms.  No further cardiovascular testing is indicated.  We will continue with aggressive risk reduction and meds as listed.  Hypothyroidism:   TSH was normal in April.  *** No change in therapy.   Hyperlipidemia:    LDL was ***103.  He needs to have this repeated at some point in time and I will do that when he comes back.   Current medicines are reviewed at length with the patient today.  The patient does not have concerns regarding medicines.  The following changes have been made:  ***  Labs/ tests ordered today include: ***  No orders of the defined types were placed in this encounter.    Disposition:   FU with me after *** evaluation   Signed, Minus Breeding, MD  11/03/2020 7:19 PM    [place]

## 2020-11-04 ENCOUNTER — Ambulatory Visit: Payer: Medicare Other | Admitting: Cardiology

## 2020-11-04 DIAGNOSIS — I10 Essential (primary) hypertension: Secondary | ICD-10-CM

## 2020-11-04 DIAGNOSIS — R55 Syncope and collapse: Secondary | ICD-10-CM

## 2020-11-04 DIAGNOSIS — I4892 Unspecified atrial flutter: Secondary | ICD-10-CM

## 2020-11-04 DIAGNOSIS — I2510 Atherosclerotic heart disease of native coronary artery without angina pectoris: Secondary | ICD-10-CM

## 2020-11-04 DIAGNOSIS — E785 Hyperlipidemia, unspecified: Secondary | ICD-10-CM

## 2020-11-10 ENCOUNTER — Telehealth: Payer: Self-pay | Admitting: Family Medicine

## 2020-11-10 ENCOUNTER — Other Ambulatory Visit: Payer: Self-pay | Admitting: Family Medicine

## 2020-11-10 DIAGNOSIS — M25561 Pain in right knee: Secondary | ICD-10-CM

## 2020-11-10 NOTE — Telephone Encounter (Signed)
Received call from patient's daughter Jeannene Patella to request referral to orthopedic surgeon for receiving cortisone shots in right knee. Requesting referral be made out to orthopedic surgeon Dr. Stann Mainland. Please advise Pam at [phone] if referral needed or if patient can go straight to Dr. Dennie Maizes office.

## 2020-11-11 NOTE — Progress Notes (Signed)
Cardiology Clinic Note   Patient Name: [name] Date of Encounter: 11/11/2020  Primary Care Provider:  [name], MD Primary Cardiologist:  Minus Breeding, MD  Patient Profile    [name] 87 year old male presents to the clinic today for follow-up evaluation of his complete AV heart block.  Past Medical History    Past Medical History:  Diagnosis Date   Atrial flutter ([place])    BPH (benign prostatic hyperplasia)    Coronary artery disease    a. s/p MI tx with Cypher DES to pRCA in 4/04;  b. Echocardiogram 7/09: Normal LV function.  c. Nuclear study 3/13 no ischemia;  d. ETT 2/14 neg;  e. admx with CP => LHC (01/30/2013):  pLAD 40-50%, oD1 70-80%, oOM1 40%, pRCA stent patent, mid RCA 30%, EF 60-65%. => med Rx.   DDD (degenerative disc disease)    Diabetes mellitus without complication ([place])    Dyslipidemia    Gait disorder 07/13/2018   Hemorrhoids    Hx MRSA infection    left buttocks abscess   Hx of echocardiogram    a. Echocardiogram (01/31/2013): Mild focal basal and mild concentric hypertrophy of the septum, EF 50-55%, normal wall motion, grade 1 diastolic dysfunction, trivial AI, MAC, mild LAE, PASP 35   Hyperlipidemia    Hypertension    Hypothyroidism    Meniere's disease    Status post shunt   Myocardial infarction [facility]) 2008   Occlusion and stenosis of carotid artery without mention of cerebral infarction    40-59% on carotid doppler 2014; [place] (01/2013): R 1-39%, L 60-79%   Rotator cuff injury    chronic rotator cuff injury status post repair   Seizure disorder ([place])    Stroke ([place])    a. 01/2013=> post cardiac cath CVA to L post communicating artery system; R sided weakness   Syncope    Past Surgical History:  Procedure Laterality Date   COLONOSCOPY     CORONARY ANGIOPLASTY WITH STENT PLACEMENT  07/14/2002   Stent to the right coronary artery   ENDOLYMPHATIC SHUNT DECOMPRESSION  06/11/2009    Right endolymphatic sac decompression and  shunt  placement   HERNIA REPAIR  04/10/2008   scrotal hernia repair   LEFT HEART CATHETERIZATION WITH CORONARY ANGIOGRAM N/A 01/30/2013   Procedure: LEFT HEART CATHETERIZATION WITH CORONARY ANGIOGRAM;  Surgeon: Larey Dresser, MD;  Location: [facility] CATH LAB;  Service: Cardiovascular;  Laterality: N/A;   PACEMAKER IMPLANT N/A 08/29/2020   Procedure: PACEMAKER IMPLANT;  Surgeon: [name], MD;  Location: [place] CV LAB;  Service: Cardiovascular;  Laterality: N/A;   ROTATOR CUFF REPAIR     for chronic rotator cuff injury    Allergies  Allergies  Allergen Reactions   Penicillins     Unknown reaction    History of Present Illness    [name] has a PMH of coronary artery disease, dyslipidemia, chronic diastolic CHF, paroxysmal atrial fibrillation, CVA following cardiac catheterization in 2014, atrial flutter/fibrillation, and complete heart block status post PPM (DDD) 08/29/2020.  He was admitted in April with lower extremity weakness.  An MRI demonstrated spinal stenosis.  He was also felt to be dehydrated.  A couple days later he was noted to have syncope.  Etiology is not clear.  He was on a beta-blocker at that time and it was discontinued.  He wore a cardiac event monitor that showed ventricular asystole up to 4.5 seconds.  He was not symptomatic.  An  echocardiogram showed low normal EF.  He was ambulated with a walker and continued to have weakness and lower extremity tiredness.  He also expressed concern about his normal pressure hydrocephalus and being told that he needed a shunt.  He had not been seen by neurology for several years.  He was not sure he would benefit from the procedure.  He followed up with Dr. Percival Spanish 07/25/2020.  During that time he had not had any further episodes of near syncope.  He did not notice palpitations.  He denied chest pressure arm and neck discomfort.  He denied shortness of breath PND and orthopnea.  He presents the clinic today for  follow-up evaluation states he feels well but continues to notice lower extremity weakness.  He continues to use his Rollator walker.  He reports that he fell several months ago and injured his right knee.  It has been slow to heal.  He reports that he has had both knees operated on in the past.  He is thinking about going to see his orthopedic surgeon.  He weighs 179 pounds today and continues to eat a calorie restricted diet.  He previously weighed as much as 220 pounds.  He is tolerating his medications well without side effects.  I will give him a salty 6 diet sheet, have him maintain his physical activity and plan follow-up in 6 months.  Today he denies chest pain, shortness of breath, lower extremity edema, fatigue, palpitations, melena, hematuria, hemoptysis, diaphoresis, weakness, presyncope, syncope, orthopnea, and PND.   Home Medications    Prior to Admission medications   Medication Sig Start Date End Date Taking? Authorizing Provider  acetaminophen (TYLENOL) 325 MG tablet Take 1-2 tablets (325-650 mg total) by mouth every 4 (four) hours as needed for mild pain. 06/15/18   Love, Ivan Anchors, PA-C  albuterol (VENTOLIN HFA) 108 (90 Base) MCG/ACT inhaler Inhale 2 puffs into the lungs every 4 (four) hours as needed for wheezing or shortness of breath. 12/27/19   [name], MD  atorvastatin (LIPITOR) 80 MG tablet TAKE 1 TABLET BY MOUTH EVERY DAY AT 6 PM 07/15/20   [name], MD  carbamazepine (CARBATROL) 300 MG 12 hr capsule TAKE 1 CAPSULE BY MOUTH TWICE A DAY 12/28/19   [name], MD  cloNIDine (CATAPRES) 0.2 MG tablet TAKE 1 TABLET(0.2 MG) BY MOUTH TWICE DAILY 06/17/20   [name], MD  escitalopram (LEXAPRO) 5 MG tablet TAKE 1 TABLET BY MOUTH EVERY DAY AT BEDTIME 09/03/20   [name], MD  furosemide (LASIX) 20 MG tablet Take 0.5 tablets (10 mg total) by mouth daily. 12/27/19   [name], MD  glucose blood test strip 1 each by Other route as needed. Use as  instructed 08/25/18   Alycia Rossetti, MD  glucose monitoring kit (FREESTYLE) monitoring kit 1 each by Does not apply route as needed. 08/25/18   Elba, Modena Nunnery, MD  HYDROcodone-acetaminophen (NORCO) 5-325 MG tablet Take 1 tablet by mouth every 6 (six) hours as needed for moderate pain. 07/07/20   [name], MD  isosorbide mononitrate (IMDUR) 60 MG 24 hr tablet Take 0.5 tablets (30 mg total) by mouth daily. 07/01/20   Lorella Nimrod, MD  Lancets (ACCU-CHEK SAFE-T PRO) lancets 1 each by Other route as needed. Use as instructed 08/25/18   Alycia Rossetti, MD  levETIRAcetam (KEPPRA) 250 MG tablet TAKE 1 TABLET(250 MG) BY MOUTH TWICE DAILY 08/20/20   [name], MD  levothyroxine (SYNTHROID) 75 MCG tablet TAKE 1 TABLET(75 MCG) BY MOUTH DAILY 09/02/20   [name], MD  losartan (COZAAR) 100 MG tablet Take 0.5 tablets (50 mg total) by mouth daily. 07/01/20   Lorella Nimrod, MD  Multiple Vitamins-Minerals (MULTIVITAMINS THER. W/MINERALS) TABS Take 1 tablet by mouth at bedtime.      [provider]  MYRBETRIQ 50 MG TB24 tablet Take 50 mg by mouth daily. 08/07/20   [provider]  rivaroxaban (XARELTO) 20 MG TABS tablet Take 1 tablet (20 mg total) by mouth daily with supper. Stop coumadin (warfarin) 08/21/20   [name], MD  tamsulosin (FLOMAX) 0.4 MG CAPS capsule Take 1 capsule (0.4 mg total) by mouth daily after supper. 08/05/20   [name], MD    Family History    Family History  Problem Relation Age of Onset   Heart attack Father 75   Aneurysm Mother        brain aneurysm   Coronary artery disease Brother        with CABG   Diabetes Brother    Colon cancer Neg Hx    Colon polyps Neg Hx    He indicated that his mother is deceased. He indicated that his father is deceased. He indicated that the status of his neg hx is unknown.  Social History    Social History   Socioeconomic History   Marital status: Married    Spouse name: Not on file    Number of children: 4   Years of education: 12th   Highest education level: Not on file  Occupational History   Occupation: Retired  Tobacco Use   Smoking status: Former    Years: 5.00    Types: Cigarettes    Quit date: 08/19/1956    Years since quitting: 64.2   Smokeless tobacco: Former    Types: Nurse, children's Use: Never used  Substance and Sexual Activity   Alcohol use: No   Drug use: No   Sexual activity: Never  Other Topics Concern   Not on file  Social History Narrative   Lives with wife.   Social Determinants of Health   Financial Resource Strain: Not on file  Food Insecurity: Not on file  Transportation Needs: Not on file  Physical Activity: Not on file  Stress: Not on file  Social Connections: Not on file  Intimate Partner Violence: Not on file     Review of Systems    General:  No chills, fever, night sweats or weight changes.  Cardiovascular:  No chest pain, dyspnea on exertion, edema, orthopnea, palpitations, paroxysmal nocturnal dyspnea. Dermatological: No rash, lesions/masses Respiratory: No cough, dyspnea Urologic: No hematuria, dysuria Abdominal:   No nausea, vomiting, diarrhea, bright red blood per rectum, melena, or hematemesis Neurologic:  No visual changes, wkns, changes in mental status. All other systems reviewed and are otherwise negative except as noted above.  Physical Exam    VS:  There were no vitals taken for this visit. , BMI There is no height or weight on file to calculate BMI. GEN: Well nourished, well developed, in no acute distress. HEENT: normal. Neck: Supple, no JVD, carotid bruits, or masses. Cardiac: RRR, no murmurs, rubs, or gallops. No clubbing, cyanosis, edema.  Radials/DP/PT 2+ and equal bilaterally.  Respiratory:  Respirations regular and unlabored, clear to auscultation bilaterally. GI: Soft, nontender, nondistended, BS + x 4. MS: no deformity or atrophy. Skin: warm and dry, no rash. Neuro:  Strength and  sensation are intact. Psych: Normal affect.  Accessory Clinical Findings    Recent Labs: 07/10/2020: Magnesium 2.0 07/15/2020: ALT 17; TSH 3.41 08/29/2020: BUN 19; Creatinine, Ser 0.93; Hemoglobin 11.6; Platelets 257; Potassium 3.9; Sodium 134   Recent Lipid Panel    Component Value Date/Time   CHOL 179 05/30/2018 0440   TRIG 131 05/30/2018 0440   HDL 50 05/30/2018 0440   CHOLHDL 3.6 05/30/2018 0440   VLDL 26 05/30/2018 0440   LDLCALC 103 (H) 05/30/2018 0440    ECG personally reviewed by me today-none today.  Echocardiogram 07/08/2020 IMPRESSIONS     1. Left ventricular ejection fraction, by estimation, is 50 to 55%. The  left ventricle has low normal function. The left ventricle has no regional  wall motion abnormalities. There is mild left ventricular hypertrophy.  Left ventricular diastolic  parameters are consistent with Grade I diastolic dysfunction (impaired  relaxation). Elevated left atrial pressure.   2. Right ventricular systolic function is normal. The right ventricular  size is normal. There is mildly elevated pulmonary artery systolic  pressure.   3. Left atrial size was severely dilated.   4. The mitral valve is normal in structure. Trivial mitral valve  regurgitation. No evidence of mitral stenosis. Moderate mitral annular  calcification.   5. The aortic valve is tricuspid. There is mild calcification of the  aortic valve. There is moderate thickening of the aortic valve. Aortic  valve regurgitation is not visualized. Mild to moderate aortic valve  sclerosis/calcification is present, without  any evidence of aortic stenosis.   6. There is mild dilatation of the ascending aorta, measuring 39 mm.  Assessment & Plan   1.  Essential hypertension-BP today 130/80.  Well-controlled at home. Continue losartan, clonidine, Imdur Heart healthy low-sodium diet-salty 6 given Increase physical activity as tolerated  Chronic diastolic CHF-no increased DOE or activity  intolerance.  Euvolemic today. Continue losartan, furosemide Heart healthy low-sodium diet-salty 6 given Increase physical activity as tolerated  Coronary artery disease-denies recent episodes of arm neck back or chest discomfort. Continue atorvastatin, Imdur Heart healthy low-sodium diet-salty 6 given Increase physical activity as tolerated  Hyperlipidemia- LDL 103 on 05/30/18. Continue atorvastatin Heart healthy low-sodium high-fiber diet Increase physical activity as tolerated Repeat fasting lipids and LFTs  Second-degree AV heart block-no further episodes of syncope or weakness.  Underwent PPM by Dr. Lovena Le on 08/29/2020.  Tolerating device well. Follows with Dr. Lovena Le  Disposition: Follow-up with Dr. Percival Spanish in 6 months.    Jossie Ng. Ebubechukwu Jedlicka NP-C    11/11/2020, 6:36 AM [address] Office [phone] Fax [phone]  Notice: This dictation was prepared with Dragon dictation along with smaller phrase technology. Any transcriptional errors that result from this process are unintentional and may not be corrected upon review.  I spent 14 minutes examining this patient, reviewing medications, and using patient centered shared decision making involving her cardiac care.  Prior to her visit I spent greater than 20 minutes reviewing her past medical history,  medications, and prior cardiac tests.

## 2020-11-12 ENCOUNTER — Other Ambulatory Visit: Payer: Self-pay

## 2020-11-12 ENCOUNTER — Encounter: Payer: Self-pay | Admitting: General Practice

## 2020-11-12 ENCOUNTER — Ambulatory Visit (INDEPENDENT_AMBULATORY_CARE_PROVIDER_SITE_OTHER): Payer: Medicare Other | Admitting: General Practice

## 2020-11-12 VITALS — BP 130/80 | HR 76 | Resp 20 | Ht 66.0 in | Wt 179.0 lb

## 2020-11-12 DIAGNOSIS — I10 Essential (primary) hypertension: Secondary | ICD-10-CM | POA: Diagnosis not present

## 2020-11-12 DIAGNOSIS — E785 Hyperlipidemia, unspecified: Secondary | ICD-10-CM | POA: Diagnosis not present

## 2020-11-12 DIAGNOSIS — I2510 Atherosclerotic heart disease of native coronary artery without angina pectoris: Secondary | ICD-10-CM

## 2020-11-12 DIAGNOSIS — I442 Atrioventricular block, complete: Secondary | ICD-10-CM | POA: Diagnosis not present

## 2020-11-12 DIAGNOSIS — I5032 Chronic diastolic (congestive) heart failure: Secondary | ICD-10-CM | POA: Diagnosis not present

## 2020-11-12 NOTE — Telephone Encounter (Signed)
Please advise 

## 2020-11-12 NOTE — Patient Instructions (Signed)
Medication Instructions:  The current medical regimen is effective;  continue present plan and medications as directed. Please refer to the Current Medication list given to you today.   *If you need a refill on your cardiac medications before your next appointment, please call your pharmacy*  Lab Work:   Testing/Procedures:  NONE    NONE  Special Instructions PLEASE READ AND FOLLOW SALTY 6-ATTACHED-1,'800mg'$  daily  Follow-Up: Your next appointment:  6 month(s) In Person with Minus Breeding, MD   At [facility], you and your health needs are our priority.  As part of our continuing mission to provide you with exceptional heart care, we have created designated Provider Care Teams.  These Care Teams include your primary Cardiologist (physician) and Advanced Practice Providers (APPs -  Physician Assistants and Nurse Practitioners) who all work together to provide you with the care you need, when you need it.             6 SALTY THINGS TO AVOID     1,'800MG'$  DAILY

## 2020-11-13 NOTE — Telephone Encounter (Signed)
Referral orders placed

## 2020-11-14 ENCOUNTER — Telehealth: Payer: Self-pay | Admitting: Family Medicine

## 2020-11-14 NOTE — Telephone Encounter (Signed)
Patient's daughter Jeannene Patella states patient no longer has [employer]. Patient currently covered under [employer], [place] [id number].

## 2020-11-14 NOTE — Telephone Encounter (Signed)
Called Pam to give update; patient will be waiting to hear from orthopedic surgeon's office for scheduling.

## 2020-11-14 NOTE — Telephone Encounter (Signed)
Received call from patient's daughter Jeannene Patella to follow up on referral request. Please contact patient's daughter Jeannene Patella to schedule appointment for orthopaedic surgeon at [phone]

## 2020-11-25 ENCOUNTER — Encounter: Payer: Medicare Other | Admitting: Internal Medicine

## 2020-12-01 ENCOUNTER — Other Ambulatory Visit: Payer: Self-pay | Admitting: Family Medicine

## 2020-12-01 ENCOUNTER — Ambulatory Visit (INDEPENDENT_AMBULATORY_CARE_PROVIDER_SITE_OTHER): Payer: Medicare Other

## 2020-12-01 DIAGNOSIS — I442 Atrioventricular block, complete: Secondary | ICD-10-CM | POA: Diagnosis not present

## 2020-12-01 DIAGNOSIS — M25561 Pain in right knee: Secondary | ICD-10-CM | POA: Insufficient documentation

## 2020-12-01 LAB — CUP PACEART REMOTE DEVICE CHECK
Battery Remaining Longevity: 154 mo
Battery Voltage: 3.16 V
Brady Statistic AP VP Percent: 8.97 %
Brady Statistic AP VS Percent: 0.03 %
Brady Statistic AS VP Percent: 90.76 %
Brady Statistic AS VS Percent: 0.24 %
Brady Statistic RA Percent Paced: 9.02 %
Brady Statistic RV Percent Paced: 99.73 %
Date Time Interrogation Session: 20220911230558
Implantable Lead Implant Date: 20220610
Implantable Lead Implant Date: 20220610
Implantable Lead Location: 753859
Implantable Lead Location: 753860
Implantable Lead Model: 3830
Implantable Lead Model: 5076
Implantable Pulse Generator Implant Date: 20220610
Lead Channel Impedance Value: 247 Ohm
Lead Channel Impedance Value: 342 Ohm
Lead Channel Impedance Value: 342 Ohm
Lead Channel Impedance Value: 551 Ohm
Lead Channel Pacing Threshold Amplitude: 0.5 V
Lead Channel Pacing Threshold Amplitude: 0.625 V
Lead Channel Pacing Threshold Pulse Width: 0.4 ms
Lead Channel Pacing Threshold Pulse Width: 0.4 ms
Lead Channel Sensing Intrinsic Amplitude: 1.875 mV
Lead Channel Sensing Intrinsic Amplitude: 1.875 mV
Lead Channel Sensing Intrinsic Amplitude: 14.5 mV
Lead Channel Sensing Intrinsic Amplitude: 14.5 mV
Lead Channel Setting Pacing Amplitude: 1.5 V
Lead Channel Setting Pacing Amplitude: 2 V
Lead Channel Setting Pacing Pulse Width: 0.4 ms
Lead Channel Setting Sensing Sensitivity: 1.2 mV

## 2020-12-09 NOTE — Progress Notes (Signed)
Remote pacemaker transmission.   

## 2020-12-10 ENCOUNTER — Ambulatory Visit: Payer: Medicare Other | Admitting: Internal Medicine

## 2020-12-10 ENCOUNTER — Other Ambulatory Visit: Payer: Self-pay

## 2020-12-10 ENCOUNTER — Encounter: Payer: Self-pay | Admitting: Internal Medicine

## 2020-12-10 VITALS — BP 138/70 | HR 60 | Ht 68.0 in | Wt 180.4 lb

## 2020-12-10 DIAGNOSIS — I442 Atrioventricular block, complete: Secondary | ICD-10-CM

## 2020-12-10 NOTE — Patient Instructions (Signed)
Medication Instructions:  Your physician recommends that you continue on your current medications as directed. Please refer to the Current Medication list given to you today.  *If you need a refill on your cardiac medications before your next appointment, please call your pharmacy*   Lab Work: none If you have labs (blood work) drawn today and your tests are completely normal, you will receive your results only by: [place] (if you have MyChart) OR A paper copy in the mail If you have any lab test that is abnormal or we need to change your treatment, we will call you to review the results.   Testing/Procedures: none   Follow-Up: At [facility], you and your health needs are our priority.  As part of our continuing mission to provide you with exceptional heart care, we have created designated Provider Care Teams.  These Care Teams include your primary Cardiologist (physician) and Advanced Practice Providers (APPs -  Physician Assistants and Nurse Practitioners) who all work together to provide you with the care you need, when you need it.  We recommend signing up for the patient portal called "MyChart".  Sign up information is provided on this After Visit Summary.  MyChart is used to connect with patients for Virtual Visits (Telemedicine).  Patients are able to view lab/test results, encounter notes, upcoming appointments, etc.  Non-urgent messages can be sent to your provider as well.   To learn more about what you can do with MyChart, go to [email or web address].    Your next appointment:   1 year(s)  The format for your next appointment:   In Person  Provider:   [name], MD   Other Instructions

## 2020-12-10 NOTE — Progress Notes (Signed)
HPI [name] returns today for followup. He is a pleasant 87 yo man with a h/o symptomatic intermittant CHB, s/p PPM insertion. He has been found to have PAF and placed on Xarelto. He fell and hurt his knee several months ago and is now using a walker. He denies chest pain or sob but is sedentary from his knee. No edema. No pain at his PM insertion site.  Allergies  Allergen Reactions   Penicillins     Unknown reaction     Current Outpatient Medications  Medication Sig Dispense Refill   acetaminophen (TYLENOL) 325 MG tablet Take 1-2 tablets (325-650 mg total) by mouth every 4 (four) hours as needed for mild pain.     albuterol (VENTOLIN HFA) 108 (90 Base) MCG/ACT inhaler Inhale 2 puffs into the lungs every 4 (four) hours as needed for wheezing or shortness of breath. 18 g 11   atorvastatin (LIPITOR) 80 MG tablet TAKE 1 TABLET BY MOUTH EVERY DAY AT 6 PM 90 tablet 1   carbamazepine (CARBATROL) 300 MG 12 hr capsule TAKE 1 CAPSULE BY MOUTH TWICE A DAY 180 capsule 3   cloNIDine (CATAPRES) 0.2 MG tablet TAKE 1 TABLET(0.2 MG) BY MOUTH TWICE DAILY 180 tablet 3   escitalopram (LEXAPRO) 5 MG tablet TAKE 1 TABLET BY MOUTH EVERY DAY AT BEDTIME 90 tablet 1   furosemide (LASIX) 20 MG tablet TAKE 1/2 TABLET(10 MG) BY MOUTH DAILY 45 tablet 2   glucose blood test strip 1 each by Other route as needed. Use as instructed 100 each 3   glucose monitoring kit (FREESTYLE) monitoring kit 1 each by Does not apply route as needed. 1 each 0   HYDROcodone-acetaminophen (NORCO) 5-325 MG tablet Take 1 tablet by mouth every 6 (six) hours as needed for moderate pain. 15 tablet 0   isosorbide mononitrate (IMDUR) 60 MG 24 hr tablet Take 0.5 tablets (30 mg total) by mouth daily. 90 tablet 3   Lancets (ACCU-CHEK SAFE-T PRO) lancets 1 each by Other route as needed. Use as instructed 100 each 3   levETIRAcetam (KEPPRA) 250 MG tablet TAKE 1 TABLET(250 MG) BY MOUTH TWICE DAILY 180 tablet 1   levothyroxine (SYNTHROID) 75  MCG tablet TAKE 1 TABLET(75 MCG) BY MOUTH DAILY 90 tablet 0   losartan (COZAAR) 100 MG tablet Take 0.5 tablets (50 mg total) by mouth daily. 90 tablet 1   Multiple Vitamins-Minerals (MULTIVITAMINS THER. W/MINERALS) TABS Take 1 tablet by mouth at bedtime.       MYRBETRIQ 50 MG TB24 tablet Take 50 mg by mouth daily.     rivaroxaban (XARELTO) 20 MG TABS tablet Take 1 tablet (20 mg total) by mouth daily with supper. Stop coumadin (warfarin) 30 tablet 5   tamsulosin (FLOMAX) 0.4 MG CAPS capsule TAKE 1 CAPSULE(0.4 MG) BY MOUTH DAILY AFTER SUPPER 90 capsule 0   No current facility-administered medications for this visit.     Past Medical History:  Diagnosis Date   Atrial flutter ([place])    BPH (benign prostatic hyperplasia)    Coronary artery disease    a. s/p MI tx with Cypher DES to pRCA in 4/04;  b. Echocardiogram 7/09: Normal LV function.  c. Nuclear study 3/13 no ischemia;  d. ETT 2/14 neg;  e. admx with CP => LHC (01/30/2013):  pLAD 40-50%, oD1 70-80%, oOM1 40%, pRCA stent patent, mid RCA 30%, EF 60-65%. => med Rx.   DDD (degenerative disc disease)    Diabetes mellitus without complication ([place])  Dyslipidemia    Gait disorder 07/13/2018   Hemorrhoids    Hx MRSA infection    left buttocks abscess   Hx of echocardiogram    a. Echocardiogram (01/31/2013): Mild focal basal and mild concentric hypertrophy of the septum, EF 50-55%, normal wall motion, grade 1 diastolic dysfunction, trivial AI, MAC, mild LAE, PASP 35   Hyperlipidemia    Hypertension    Hypothyroidism    Meniere's disease    Status post shunt   Myocardial infarction [facility]) 2008   Occlusion and stenosis of carotid artery without mention of cerebral infarction    40-59% on carotid doppler 2014; [place] (01/2013): R 1-39%, L 60-79%   Rotator cuff injury    chronic rotator cuff injury status post repair   Seizure disorder ([place])    Stroke ([place])    a. 01/2013=> post cardiac cath CVA to L post communicating artery system; R sided  weakness   Syncope     ROS:   All systems reviewed and negative except as noted in the HPI.   Past Surgical History:  Procedure Laterality Date   COLONOSCOPY     CORONARY ANGIOPLASTY WITH STENT PLACEMENT  07/14/2002   Stent to the right coronary artery   ENDOLYMPHATIC SHUNT DECOMPRESSION  06/11/2009    Right endolymphatic sac decompression and shunt  placement   HERNIA REPAIR  04/10/2008   scrotal hernia repair   LEFT HEART CATHETERIZATION WITH CORONARY ANGIOGRAM N/A 01/30/2013   Procedure: LEFT HEART CATHETERIZATION WITH CORONARY ANGIOGRAM;  Surgeon: Larey Dresser, MD;  [facility] LAB;  Service: Cardiovascular;  Laterality: N/A;   PACEMAKER IMPLANT N/A 08/29/2020   Procedure: PACEMAKER IMPLANT;  Surgeon: [name], MD;  Location: [place] CV LAB;  Service: Cardiovascular;  Laterality: N/A;   ROTATOR CUFF REPAIR     for chronic rotator cuff injury     Family History  Problem Relation Age of Onset   Heart attack Father 70   Aneurysm Mother        brain aneurysm   Coronary artery disease Brother        with CABG   Diabetes Brother    Colon cancer Neg Hx    Colon polyps Neg Hx      Social History   Socioeconomic History   Marital status: Married    Spouse name: Not on file   Number of children: 4   Years of education: 12th   Highest education level: Not on file  Occupational History   Occupation: Retired  Tobacco Use   Smoking status: Former    Years: 5.00    Types: Cigarettes    Quit date: 08/19/1956    Years since quitting: 64.3   Smokeless tobacco: Former    Types: Nurse, children's Use: Never used  Substance and Sexual Activity   Alcohol use: No   Drug use: No   Sexual activity: Never  Other Topics Concern   Not on file  Social History Narrative   Lives with wife.   Social Determinants of Health   Financial Resource Strain: Not on file  Food Insecurity: Not on file  Transportation Needs: Not on file  Physical Activity:  Not on file  Stress: Not on file  Social Connections: Not on file  Intimate Partner Violence: Not on file     BP 138/70   Pulse 60   Ht _0  (1.727 m)   Wt 180 lb 6.4 oz (81.8 kg)  SpO2 96%   BMI 27.43 kg/m   Physical Exam:  Well appearing NAD HEENT: Unremarkable Neck:  No JVD, no thyromegally Lymphatics:  No adenopathy Back:  No CVA tenderness Lungs:  Clear with no wheezes HEART:  Regular rate rhythm, no murmurs, no rubs, no clicks Abd:  soft, positive bowel sounds, no organomegally, no rebound, no guarding Ext:  2 plus pulses, no edema, no cyanosis, no clubbing Skin:  No rashes no nodules Neuro:  CN II through XII intact, motor grossly intact  EKG - NSR with AV pacing  DEVICE  Normal device function.  See PaceArt for details.   Assess/Plan:  CHB  - he is asymptomatic, s/p PPM insertion. PPM - his medtronic DDD PM is working normally. We will recheck in several months. HTN - his bp is reasonably well controlled. CAD - he is s/p remote stenting and denies anginal symptoms. We will continue his current meds.  Daniel Overlie Bryar Rennie,MD

## 2020-12-25 DIAGNOSIS — M1711 Unilateral primary osteoarthritis, right knee: Secondary | ICD-10-CM | POA: Diagnosis not present

## 2020-12-25 DIAGNOSIS — M25561 Pain in right knee: Secondary | ICD-10-CM | POA: Diagnosis not present

## 2020-12-26 ENCOUNTER — Other Ambulatory Visit: Payer: Self-pay | Admitting: Family Medicine

## 2021-01-08 ENCOUNTER — Ambulatory Visit (INDEPENDENT_AMBULATORY_CARE_PROVIDER_SITE_OTHER): Payer: Medicare Other | Admitting: Family Medicine

## 2021-01-08 ENCOUNTER — Other Ambulatory Visit: Payer: Self-pay

## 2021-01-08 VITALS — BP 180/78 | HR 54 | Temp 97.5°F | Resp 18 | Wt 182.0 lb

## 2021-01-08 DIAGNOSIS — I10 Essential (primary) hypertension: Secondary | ICD-10-CM

## 2021-01-08 DIAGNOSIS — R310 Gross hematuria: Secondary | ICD-10-CM | POA: Diagnosis not present

## 2021-01-08 DIAGNOSIS — R319 Hematuria, unspecified: Secondary | ICD-10-CM

## 2021-01-08 LAB — URINALYSIS, ROUTINE W REFLEX MICROSCOPIC
Bacteria, UA: NONE SEEN /HPF
Bilirubin Urine: NEGATIVE
Glucose, UA: NEGATIVE
Hyaline Cast: NONE SEEN /LPF
Ketones, ur: NEGATIVE
Leukocytes,Ua: NEGATIVE
Nitrite: NEGATIVE
Protein, ur: NEGATIVE
Specific Gravity, Urine: 1.015 (ref 1.001–1.035)
Squamous Epithelial / HPF: NONE SEEN /HPF (ref <=5)
WBC, UA: NONE SEEN /HPF (ref 0–5)
pH: 6.5 (ref 5.0–8.0)

## 2021-01-08 LAB — MICROSCOPIC MESSAGE

## 2021-01-08 NOTE — Progress Notes (Signed)
Subjective:    Patient ID: [name], male    DOB: 1933/05/26, 87 y.o.   MRN: [id number]  Patient states that last Tuesday, he saw some blood in his shorts that had come from his penis.  He then had to urinate and the urine was red-tinged.  He has not seen any additional blood since that time in over a week.  He denies any dysuria.  He denies any frequency.  He denies any urgency.  He denies any fevers or chills or back pain.  Urinalysis today shows trace blood.  He is on Xarelto for atrial fibrillation although the patient is unsure of the medicines he is taking and has no list of the medicines with him.  I went through the medicine list with him as best I could but he is uncertain.  I performed a prostate exam today.  The prostate is mildly enlarged.  However there is no nodularity or tenderness to palpation.  Testicular exam today is normal.  There is no mass or tenderness.  There is no inguinal lymphadenopathy.  The patient has a retracted penis into his lower abdominal pannus.  I was able to examine the foreskin and see the tip of the penis and there was no lesion that was bleeding or trauma around the urethra. Past Medical History:  Diagnosis Date   Atrial flutter ([place])    BPH (benign prostatic hyperplasia)    Coronary artery disease    a. s/p MI tx with Cypher DES to pRCA in 4/04;  b. Echocardiogram 7/09: Normal LV function.  c. Nuclear study 3/13 no ischemia;  d. ETT 2/14 neg;  e. admx with CP => LHC (01/30/2013):  pLAD 40-50%, oD1 70-80%, oOM1 40%, pRCA stent patent, mid RCA 30%, EF 60-65%. => med Rx.   DDD (degenerative disc disease)    Diabetes mellitus without complication ([place])    Dyslipidemia    Gait disorder 07/13/2018   Hemorrhoids    Hx MRSA infection    left buttocks abscess   Hx of echocardiogram    a. Echocardiogram (01/31/2013): Mild focal basal and mild concentric hypertrophy of the septum, EF 50-55%, normal wall motion, grade 1 diastolic dysfunction, trivial AI, MAC,  mild LAE, PASP 35   Hyperlipidemia    Hypertension    Hypothyroidism    Meniere's disease    Status post shunt   Myocardial infarction [facility]) 2008   Occlusion and stenosis of carotid artery without mention of cerebral infarction    40-59% on carotid doppler 2014; [place] (01/2013): R 1-39%, L 60-79%   Rotator cuff injury    chronic rotator cuff injury status post repair   Seizure disorder ([place])    Stroke ([place])    a. 01/2013=> post cardiac cath CVA to L post communicating artery system; R sided weakness   Syncope    Past Surgical History:  Procedure Laterality Date   COLONOSCOPY     CORONARY ANGIOPLASTY WITH STENT PLACEMENT  07/14/2002   Stent to the right coronary artery   ENDOLYMPHATIC SHUNT DECOMPRESSION  06/11/2009    Right endolymphatic sac decompression and shunt  placement   HERNIA REPAIR  04/10/2008   scrotal hernia repair   LEFT HEART CATHETERIZATION WITH CORONARY ANGIOGRAM N/A 01/30/2013   Procedure: LEFT HEART CATHETERIZATION WITH CORONARY ANGIOGRAM;  Surgeon: Larey Dresser, MD;  Location: [facility] CATH LAB;  Service: Cardiovascular;  Laterality: N/A;   PACEMAKER IMPLANT N/A 08/29/2020   Procedure: PACEMAKER IMPLANT;  Surgeon: [name], MD;  Location: [facility] CV LAB;  Service: Cardiovascular;  Laterality: N/A;   ROTATOR CUFF REPAIR     for chronic rotator cuff injury   Current Outpatient Medications on File Prior to Visit  Medication Sig Dispense Refill   acetaminophen (TYLENOL) 325 MG tablet Take 1-2 tablets (325-650 mg total) by mouth every 4 (four) hours as needed for mild pain.     albuterol (VENTOLIN HFA) 108 (90 Base) MCG/ACT inhaler Inhale 2 puffs into the lungs every 4 (four) hours as needed for wheezing or shortness of breath. 18 g 11   atorvastatin (LIPITOR) 80 MG tablet TAKE 1 TABLET BY MOUTH EVERY DAY AT 6 PM 90 tablet 1   carbamazepine (CARBATROL) 300 MG 12 hr capsule TAKE 1 CAPSULE BY MOUTH TWICE A DAY 180 capsule 3   cloNIDine (CATAPRES) 0.2 MG tablet  TAKE 1 TABLET(0.2 MG) BY MOUTH TWICE DAILY 180 tablet 3   escitalopram (LEXAPRO) 5 MG tablet TAKE 1 TABLET BY MOUTH EVERY DAY AT BEDTIME 90 tablet 1   furosemide (LASIX) 20 MG tablet TAKE 1/2 TABLET(10 MG) BY MOUTH DAILY 45 tablet 2   glucose blood test strip 1 each by Other route as needed. Use as instructed 100 each 3   glucose monitoring kit (FREESTYLE) monitoring kit 1 each by Does not apply route as needed. 1 each 0   HYDROcodone-acetaminophen (NORCO) 5-325 MG tablet Take 1 tablet by mouth every 6 (six) hours as needed for moderate pain. 15 tablet 0   isosorbide mononitrate (IMDUR) 60 MG 24 hr tablet Take 0.5 tablets (30 mg total) by mouth daily. 90 tablet 3   Lancets (ACCU-CHEK SAFE-T PRO) lancets 1 each by Other route as needed. Use as instructed 100 each 3   levETIRAcetam (KEPPRA) 250 MG tablet TAKE 1 TABLET(250 MG) BY MOUTH TWICE DAILY 180 tablet 1   levothyroxine (SYNTHROID) 75 MCG tablet TAKE 1 TABLET(75 MCG) BY MOUTH DAILY 90 tablet 0   losartan (COZAAR) 100 MG tablet Take 0.5 tablets (50 mg total) by mouth daily. 90 tablet 1   Multiple Vitamins-Minerals (MULTIVITAMINS THER. W/MINERALS) TABS Take 1 tablet by mouth at bedtime.       MYRBETRIQ 50 MG TB24 tablet Take 50 mg by mouth daily.     rivaroxaban (XARELTO) 20 MG TABS tablet Take 1 tablet (20 mg total) by mouth daily with supper. Stop coumadin (warfarin) 30 tablet 5   tamsulosin (FLOMAX) 0.4 MG CAPS capsule TAKE 1 CAPSULE(0.4 MG) BY MOUTH DAILY AFTER SUPPER 90 capsule 0   No current facility-administered medications on file prior to visit.   Allergies  Allergen Reactions   Penicillins     Unknown reaction   Social History   Socioeconomic History   Marital status: Married    Spouse name: Not on file   Number of children: 4   Years of education: 12th   Highest education level: Not on file  Occupational History   Occupation: Retired  Tobacco Use   Smoking status: Former    Years: 5.00    Types: Cigarettes    Quit  date: 08/19/1956    Years since quitting: 64.4   Smokeless tobacco: Former    Types: Nurse, children's Use: Never used  Substance and Sexual Activity   Alcohol use: No   Drug use: No   Sexual activity: Never  Other Topics Concern   Not on file  Social History Narrative   Lives with wife.   Social Determinants of Health  Financial Resource Strain: Not on file  Food Insecurity: Not on file  Transportation Needs: Not on file  Physical Activity: Not on file  Stress: Not on file  Social Connections: Not on file  Intimate Partner Violence: Not on file     Review of Systems  All other systems reviewed and are negative.     Objective:   Physical Exam Vitals reviewed.  Constitutional:      General: He is not in acute distress.    Appearance: Normal appearance. He is obese. He is not ill-appearing, toxic-appearing or diaphoretic.  Cardiovascular:     Rate and Rhythm: Normal rate and regular rhythm.  Pulmonary:     Effort: Pulmonary effort is normal. No respiratory distress.     Breath sounds: Normal breath sounds. No wheezing, rhonchi or rales.  Genitourinary:    Testes: Normal.        Right: Tenderness, swelling, testicular hydrocele or varicocele not present.        Left: Tenderness, swelling, testicular hydrocele or varicocele not present.     Epididymis:     Right: Not inflamed or enlarged.     Left: Not inflamed or enlarged.     Prostate: Enlarged. Not tender and no nodules present.     Rectum: Normal. No mass or tenderness.    Musculoskeletal:     Right lower leg: No edema.     Left lower leg: No edema.  Neurological:     Mental Status: He is alert.         Assessment & Plan:  Hematuria, unspecified type - Plan: Urine Culture, Urinalysis, Routine w reflex microscopic Urinalysis shows trace blood but is otherwise normal.  Physical exam is normal.  Patient has not seen gross blood in over a week although there is still asymptomatic microscopic  hematuria on exam today.  Given his age and frailty and comorbidities, I would be hesitant about putting the patient through any surgery etc unless absolutely necessary.  I believe a lot of this could have been temporary irritation due to Xarelto.  Therefore I have recommended the patient that we monitor the situation for 1 to 2 weeks.  If he again experiences gross hematuria, I would recommend urology consultation for cystoscopy.  I would also asked the patient to come back in 1 to 2 weeks to repeat a urine sample to see if the microscopic hematuria persist.  Blood pressure is extremely high today.  Patient is not certain of his medication list.  I did ask him to increase losartan to 100 mg and then recheck blood pressure next week.  Call out the higher dose of losartan if controlled.

## 2021-01-09 LAB — URINE CULTURE
MICRO NUMBER:: 12530290
Result:: NO GROWTH
SPECIMEN QUALITY:: ADEQUATE

## 2021-01-18 ENCOUNTER — Other Ambulatory Visit: Payer: Self-pay | Admitting: Family Medicine

## 2021-01-18 DIAGNOSIS — E785 Hyperlipidemia, unspecified: Secondary | ICD-10-CM

## 2021-02-16 ENCOUNTER — Other Ambulatory Visit: Payer: Self-pay | Admitting: Family Medicine

## 2021-02-24 ENCOUNTER — Other Ambulatory Visit: Payer: Self-pay | Admitting: Family Medicine

## 2021-02-27 ENCOUNTER — Ambulatory Visit: Payer: Medicare Other

## 2021-02-28 ENCOUNTER — Other Ambulatory Visit: Payer: Self-pay | Admitting: Family Medicine

## 2021-03-06 ENCOUNTER — Other Ambulatory Visit: Payer: Self-pay

## 2021-03-06 ENCOUNTER — Ambulatory Visit (INDEPENDENT_AMBULATORY_CARE_PROVIDER_SITE_OTHER): Payer: Medicare Other

## 2021-03-06 VITALS — Ht 68.0 in | Wt 182.0 lb

## 2021-03-06 DIAGNOSIS — Z0000 Encounter for general adult medical examination without abnormal findings: Secondary | ICD-10-CM

## 2021-03-06 NOTE — Progress Notes (Signed)
Subjective:   [name] is a 87 y.o. male who presents for Medicare Annual/Subsequent preventive examination. Virtual Visit via Telephone Note  I connected with  [name] on 03/06/21 at 12:00 PM EST by telephone and verified that I am speaking with the correct person using two identifiers.  Location: Patient: Home Provider: [facility] Persons participating in the virtual visit: patient/Nurse Health Advisor   I discussed the limitations, risks, security and privacy concerns of performing an evaluation and management service by telephone and the availability of in person appointments. The patient expressed understanding and agreed to proceed.  Interactive audio and video telecommunications were attempted between this nurse and patient, however failed, due to patient having technical difficulties OR patient did not have access to video capability.  We continued and completed visit with audio only.  Some vital signs may be absent or patient reported.   Chriss Driver, LPN  Review of Systems     Cardiac Risk Factors include: advanced age (>55mn, >>72women);hypertension;dyslipidemia;male gender;sedentary lifestyle     Objective:    Today's Vitals   03/06/21 1237  Weight: 182 lb (82.6 kg)  Height: _0  (1.727 m)   Body mass index is 27.67 kg/m.  Advanced Directives 03/06/2021 08/29/2020 07/07/2020 06/29/2020 08/19/2018 06/01/2018 05/31/2018  Does Patient Have a Medical Advance Directive? Yes Yes No No No Yes Yes  Type of AParamedicof [place]Living will [place] will - - - HSpecial educational needs teacherof [place] will  Does patient want to make changes to medical advance directive? - No - Patient declined - - - No - Patient declined No - Patient declined  Copy of HMendotain Chart? No - copy requested No - copy requested - - - No - copy requested No - copy requested  Would  patient like information on creating a medical advance directive? - - - No - Patient declined No - Patient declined - -  Pre-existing out of facility DNR order (yellow form or pink MOST form) - - - - - - -    Current Medications (verified) Outpatient Encounter Medications as of 03/06/2021  Medication Sig   acetaminophen (TYLENOL) 325 MG tablet Take 1-2 tablets (325-650 mg total) by mouth every 4 (four) hours as needed for mild pain.   albuterol (VENTOLIN HFA) 108 (90 Base) MCG/ACT inhaler Inhale 2 puffs into the lungs every 4 (four) hours as needed for wheezing or shortness of breath.   atorvastatin (LIPITOR) 80 MG tablet TAKE 1 TABLET BY MOUTH EVERY DAY AT 6 PM   carbamazepine (CARBATROL) 300 MG 12 hr capsule TAKE 1 CAPSULE(300 MG) BY MOUTH TWICE DAILY   cloNIDine (CATAPRES) 0.2 MG tablet TAKE 1 TABLET(0.2 MG) BY MOUTH TWICE DAILY   escitalopram (LEXAPRO) 5 MG tablet TAKE 1 TABLET BY MOUTH EVERY DAY AT BEDTIME   furosemide (LASIX) 20 MG tablet TAKE 1/2 TABLET(10 MG) BY MOUTH DAILY   glucose blood test strip 1 each by Other route as needed. Use as instructed   glucose monitoring kit (FREESTYLE) monitoring kit 1 each by Does not apply route as needed.   isosorbide mononitrate (IMDUR) 60 MG 24 hr tablet Take 0.5 tablets (30 mg total) by mouth daily.   Lancets (ACCU-CHEK SAFE-T PRO) lancets 1 each by Other route as needed. Use as instructed   levETIRAcetam (KEPPRA) 250 MG tablet TAKE 1 TABLET(250 MG) BY MOUTH TWICE DAILY   levothyroxine (SYNTHROID) 75 MCG  tablet TAKE 1 TABLET(75 MCG) BY MOUTH DAILY   losartan (COZAAR) 100 MG tablet Take 0.5 tablets (50 mg total) by mouth daily.   Multiple Vitamins-Minerals (MULTIVITAMINS THER. W/MINERALS) TABS Take 1 tablet by mouth at bedtime.     MYRBETRIQ 50 MG TB24 tablet Take 50 mg by mouth daily.   rivaroxaban (XARELTO) 20 MG TABS tablet Take 1 tablet (20 mg total) by mouth daily with supper.   tamsulosin (FLOMAX) 0.4 MG CAPS capsule TAKE 1 CAPSULE(0.4 MG)  BY MOUTH DAILY AFTER SUPPER   HYDROcodone-acetaminophen (NORCO) 5-325 MG tablet Take 1 tablet by mouth every 6 (six) hours as needed for moderate pain. (Patient not taking: Reported on 03/06/2021)   No facility-administered encounter medications on file as of 03/06/2021.    Allergies (verified) Penicillins   History: Past Medical History:  Diagnosis Date   Atrial flutter (HCC)    BPH (benign prostatic hyperplasia)    Coronary artery disease    a. s/p MI tx with Cypher DES to pRCA in 4/04;  b. Echocardiogram 7/09: Normal LV function.  c. Nuclear study 3/13 no ischemia;  d. ETT 2/14 neg;  e. admx with CP => LHC (01/30/2013):  pLAD 40-50%, oD1 70-80%, oOM1 40%, pRCA stent patent, mid RCA 30%, EF 60-65%. => med Rx.   DDD (degenerative disc disease)    Diabetes mellitus without complication ([place])    Dyslipidemia    Gait disorder 07/13/2018   Hemorrhoids    Hx MRSA infection    left buttocks abscess   Hx of echocardiogram    a. Echocardiogram (01/31/2013): Mild focal basal and mild concentric hypertrophy of the septum, EF 50-55%, normal wall motion, grade 1 diastolic dysfunction, trivial AI, MAC, mild LAE, PASP 35   Hyperlipidemia    Hypertension    Hypothyroidism    Meniere's disease    Status post shunt   Myocardial infarction [facility]) 2008   Occlusion and stenosis of carotid artery without mention of cerebral infarction    40-59% on carotid doppler 2014; [place] (01/2013): R 1-39%, L 60-79%   Rotator cuff injury    chronic rotator cuff injury status post repair   Seizure disorder ([place])    Stroke ([place])    a. 01/2013=> post cardiac cath CVA to L post communicating artery system; R sided weakness   Syncope    Past Surgical History:  Procedure Laterality Date   COLONOSCOPY     CORONARY ANGIOPLASTY WITH STENT PLACEMENT  07/14/2002   Stent to the right coronary artery   ENDOLYMPHATIC SHUNT DECOMPRESSION  06/11/2009    Right endolymphatic sac decompression and shunt  placement   HERNIA  REPAIR  04/10/2008   scrotal hernia repair   LEFT HEART CATHETERIZATION WITH CORONARY ANGIOGRAM N/A 01/30/2013   Procedure: LEFT HEART CATHETERIZATION WITH CORONARY ANGIOGRAM;  Surgeon: Larey Dresser, MD;  Location: [facility] CATH LAB;  Service: Cardiovascular;  Laterality: N/A;   PACEMAKER IMPLANT N/A 08/29/2020   Procedure: PACEMAKER IMPLANT;  Surgeon: [name], MD;  Location: [place] CV LAB;  Service: Cardiovascular;  Laterality: N/A;   ROTATOR CUFF REPAIR     for chronic rotator cuff injury   Family History  Problem Relation Age of Onset   Heart attack Father 11   Aneurysm Mother        brain aneurysm   Coronary artery disease Brother        with CABG   Diabetes Brother    Colon cancer Neg Hx    Colon polyps Neg  Hx    Social History   Socioeconomic History   Marital status: Married    Spouse name: Not on file   Number of children: 4   Years of education: 12th   Highest education level: Not on file  Occupational History   Occupation: Retired  Tobacco Use   Smoking status: Former    Years: 5.00    Types: Cigarettes    Quit date: 08/19/1956    Years since quitting: 64.5   Smokeless tobacco: Former    Types: Nurse, children's Use: Never used  Substance and Sexual Activity   Alcohol use: No   Drug use: No   Sexual activity: Never  Other Topics Concern   Not on file  Social History Narrative   Lives with wife.   Children live nearby and help with care.    Social Determinants of Health   Financial Resource Strain: Low Risk    Difficulty of Paying Living Expenses: Not hard at all  Food Insecurity: No Food Insecurity   Worried About Charity fundraiser in the Last Year: Never true   [place] in the Last Year: Never true  Transportation Needs: No Transportation Needs   Lack of Transportation (Medical): No   Lack of Transportation (Non-Medical): No  Physical Activity: Insufficiently Active   Days of Exercise per Week: 3 days   Minutes of  Exercise per Session: 20 min  Stress: No Stress Concern Present   Feeling of Stress : Not at all  Social Connections: Socially Integrated   Frequency of Communication with Friends and Family: More than three times a week   Frequency of Social Gatherings with Friends and Family: More than three times a week   Attends Religious Services: 1 to 4 times per year   Active Member of [employer] or Organizations: Yes   Attends Archivist Meetings: 1 to 4 times per year   Marital Status: Married    Tobacco Counseling Counseling given: Not Answered   Clinical Intake:  Pre-visit preparation completed: Yes  Pain : No/denies pain     BMI - recorded: 27.67 Nutritional Status: BMI 25 -29 Overweight Nutritional Risks: None Diabetes: No  How often do you need to have someone help you when you read instructions, pamphlets, or other written materials from your doctor or pharmacy?: 1 - Never  Diabetic?  Interpreter Needed?: No  Information entered by :: mj Nadalee Neiswender, lpn   Activities of Daily Living In your present state of health, do you have any difficulty performing the following activities: 03/06/2021 08/29/2020  Hearing? N N  Vision? N N  Difficulty concentrating or making decisions? - N  Walking or climbing stairs? Y Y  Dressing or bathing? N N  Doing errands, shopping? N -  Preparing Food and eating ? N -  Using the Toilet? N -  In the past six months, have you accidently leaked urine? Y -  Do you have problems with loss of bowel control? N -  Managing your Medications? N -  Managing your Finances? N -  Housekeeping or managing your Housekeeping? N -  Some recent data might be hidden    Patient Care Team: [name], MD as PCP - General (Family Medicine) Minus Breeding, MD as PCP - Cardiology (Cardiology)  Indicate any recent Medical Services you may have received from other than [facility] providers in the past year (date may be approximate).     Assessment:  This is a routine wellness examination for [name].  Hearing/Vision screen Hearing Screening - Comments:: Some hearing issues.  Vision Screening - Comments:: Glasses.   Dietary issues and exercise activities discussed: Current Exercise Habits: Home exercise routine, Type of exercise: walking, Time (Minutes): 20, Frequency (Times/Week): 3, Weekly Exercise (Minutes/Week): 60, Intensity: Mild, Exercise limited by: cardiac condition(s);orthopedic condition(s)   Goals Addressed             This Visit's Progress    Have 3 meals a day       Pt would like to continue to eat health, sleep well and exercise as tolerated.        Depression Screen PHQ 2/9 Scores 03/06/2021 07/19/2018 02/13/2018 05/06/2017 04/14/2017 03/10/2017 12/09/2016  PHQ - 2 Score 0 0 0 0 0 0 0    Fall Risk Fall Risk  03/06/2021 07/31/2020 10/17/2018 07/19/2018 02/13/2018  Falls in the past year? 1 0 0 0 1  Comment - - - - -  Number falls in past yr: 1 0 0 - 0  Comment - - - - -  Injury with Fall? 1 0 0 - 0  Risk for fall due to : Impaired balance/gait;Impaired mobility;History of fall(s) - - - History of fall(s);Impaired mobility  Follow up Falls prevention discussed - - - Falls evaluation completed    FALL RISK PREVENTION PERTAINING TO THE HOME:  Any stairs in or around the home? No  If so, are there any without handrails? No  Home free of loose throw rugs in walkways, pet beds, electrical cords, etc? Yes  Adequate lighting in your home to reduce risk of falls? Yes   ASSISTIVE DEVICES UTILIZED TO PREVENT FALLS:  Life alert? No  Use of a cane, walker or w/c? Yes  Grab bars in the bathroom? Yes  Shower chair or bench in shower? Yes  Elevated toilet seat or a handicapped toilet? Yes   TIMED UP AND GO:  Was the test performed? No .  Phone visit.  Cognitive Function: MMSE - Mini Mental State Exam 10/17/2018  Orientation to time 5  Orientation to Place 5  Registration 3  Attention/ Calculation 5  Recall 2   Language- name 2 objects 2  Language- repeat 0  Language- follow 3 step command 3  Language- read & follow direction 1  Write a sentence 1  Copy design 0  Total score 27     6CIT Screen 03/06/2021  What Year? 0 points  What month? 0 points  What time? 0 points  Count back from 20 0 points  Months in reverse 0 points  Repeat phrase 2 points  Total Score 2    Immunizations Immunization History  Administered Date(s) Administered   Fluad Quad(high Dose 65+) 12/12/2018, 01/17/2020   Influenza, High Dose Seasonal PF 12/09/2016, 01/31/2018   Influenza,inj,Quad PF,6+ Mos 12/04/2012, 01/01/2014, 01/15/2015, 12/22/2015   PFIZER(Purple Top)SARS-COV-2 Vaccination 04/29/2019, 05/23/2019   Pneumococcal Conjugate-13 03/01/2013   Pneumococcal Polysaccharide-23 02/03/1999, 02/27/2010   Td 08/16/1996   Tdap 07/31/2014    TDAP status: Up to date  Flu Vaccine status: Due, Education has been provided regarding the importance of this vaccine. Advised may receive this vaccine at local pharmacy or Health Dept. Aware to provide a copy of the vaccination record if obtained from local pharmacy or Health Dept. Verbalized acceptance and understanding.  Pneumococcal vaccine status: Up to date  Covid-19 vaccine status: Completed vaccines  Qualifies for Shingles Vaccine? Yes   Zostavax completed No   Shingrix Completed?:  No.    Education has been provided regarding the importance of this vaccine. Patient has been advised to call insurance company to determine out of pocket expense if they have not yet received this vaccine. Advised may also receive vaccine at local pharmacy or Health Dept. Verbalized acceptance and understanding.  Screening Tests Health Maintenance  Topic Date Due   Zoster Vaccines- Shingrix (1 of 2) Never done   OPHTHALMOLOGY EXAM  09/27/2018   COVID-19 Vaccine (3 - Booster for Pfizer series) 07/18/2019   FOOT EXAM  03/19/2020   INFLUENZA VACCINE  10/20/2020   HEMOGLOBIN A1C   12/30/2020   TETANUS/TDAP  07/30/2024   Pneumonia Vaccine 65+ Years old  Completed   HPV VACCINES  Aged Out    Health Maintenance  Health Maintenance Due  Topic Date Due   Zoster Vaccines- Shingrix (1 of 2) Never done   OPHTHALMOLOGY EXAM  09/27/2018   COVID-19 Vaccine (3 - Booster for Pfizer series) 07/18/2019   FOOT EXAM  03/19/2020   INFLUENZA VACCINE  10/20/2020   HEMOGLOBIN A1C  12/30/2020    Colorectal cancer screening: No longer required.   Lung Cancer Screening: (Low Dose CT Chest recommended if Age 56-80 years, 30 pack-year currently smoking OR have quit w/in 15years.) does not qualify.     Additional Screening:  Hepatitis C Screening: does not qualify;   Vision Screening: Recommended annual ophthalmology exams for early detection of glaucoma and other disorders of the eye. Is the patient up to date with their annual eye exam?  Yes  Who is the provider or what is the name of the office in which the patient attends annual eye exams? [facility]. If pt is not established with a provider, would they like to be referred to a provider to establish care? No .   Dental Screening: Recommended annual dental exams for proper oral hygiene  Community Resource Referral / Chronic Care Management: CRR required this visit?  No   CCM required this visit?  No      Plan:     I have personally reviewed and noted the following in the patients chart:   Medical and social history Use of alcohol, tobacco or illicit drugs  Current medications and supplements including opioid prescriptions. Patient is not currently taking opioid prescriptions. Functional ability and status Nutritional status Physical activity Advanced directives List of other physicians Hospitalizations, surgeries, and ER visits in previous 12 months Vitals Screenings to include cognitive, depression, and falls Referrals and appointments  In addition, I have reviewed and discussed with patient  certain preventive protocols, quality metrics, and best practice recommendations. A written personalized care plan for preventive services as well as general preventive health recommendations were provided to patient.     Chriss Driver, LPN   [id number]/0630   Nurse Notes: Colonoscopy no longer required. Discussed Shingrix and Flu vaccines and how to obtain. 6CIT score of 2.

## 2021-03-06 NOTE — Patient Instructions (Signed)
[name] , Thank you for taking time to come for your Medicare Wellness Visit. I appreciate your ongoing commitment to your health goals. Please review the following plan we discussed and let me know if I can assist you in the future.   Screening recommendations/referrals: Colonoscopy: No longer required.  Recommended yearly ophthalmology/optometry visit for glaucoma screening and checkup Recommended yearly dental visit for hygiene and checkup  Vaccinations: Influenza vaccine: Done 01/17/2020 Repeat annually  Pneumococcal vaccine: Done 02/27/2010 and 03/01/2013 Tdap vaccine: Done 07/31/2014 Repeat in 10 years  Shingles vaccine: Shingrix discussed. Please contact your pharmacy for coverage information.     Covid-19: Done 04/29/2019 and 05/23/2019  Advanced directives: Please bring a copy of your health care power of attorney and living will to the office to be added to your chart at your convenience.   Conditions/risks identified: Aim for 30 minutes of exercise each day, drink 6-8 glasses of water and eat lots of fruits and vegetables.   Next appointment: Follow up in one year for your annual wellness visit. 2023.  Preventive Care 35 Years and Older, Male  Preventive care refers to lifestyle choices and visits with your health care provider that can promote health and wellness. What does preventive care include? A yearly physical exam. This is also called an annual well check. Dental exams once or twice a year. Routine eye exams. Ask your health care provider how often you should have your eyes checked. Personal lifestyle choices, including: Daily care of your teeth and gums. Regular physical activity. Eating a healthy diet. Avoiding tobacco and drug use. Limiting alcohol use. Practicing safe sex. Taking low doses of aspirin every day. Taking vitamin and mineral supplements as recommended by your health care provider. What happens during an annual well check? The services and  screenings done by your health care provider during your annual well check will depend on your age, overall health, lifestyle risk factors, and family history of disease. Counseling  Your health care provider may ask you questions about your: Alcohol use. Tobacco use. Drug use. Emotional well-being. Home and relationship well-being. Sexual activity. Eating habits. History of falls. Memory and ability to understand (cognition). Work and work Statistician. Screening  You may have the following tests or measurements: Height, weight, and BMI. Blood pressure. Lipid and cholesterol levels. These may be checked every 5 years, or more frequently if you are over 85 years old. Skin check. Lung cancer screening. You may have this screening every year starting at age 27 if you have a 30-pack-year history of smoking and currently smoke or have quit within the past 15 years. Fecal occult blood test (FOBT) of the stool. You may have this test every year starting at age 57. Flexible sigmoidoscopy or colonoscopy. You may have a sigmoidoscopy every 5 years or a colonoscopy every 10 years starting at age 34. Prostate cancer screening. Recommendations will vary depending on your family history and other risks. Hepatitis C blood test. Hepatitis B blood test. Sexually transmitted disease (STD) testing. Diabetes screening. This is done by checking your blood sugar (glucose) after you have not eaten for a while (fasting). You may have this done every 1-3 years. Abdominal aortic aneurysm (AAA) screening. You may need this if you are a current or former smoker. Osteoporosis. You may be screened starting at age 70 if you are at high risk. Talk with your health care provider about your test results, treatment options, and if necessary, the need for more tests. Vaccines  Your health  care provider may recommend certain vaccines, such as: Influenza vaccine. This is recommended every year. Tetanus, diphtheria, and  acellular pertussis (Tdap, Td) vaccine. You may need a Td booster every 10 years. Zoster vaccine. You may need this after age 56. Pneumococcal 13-valent conjugate (PCV13) vaccine. One dose is recommended after age 3. Pneumococcal polysaccharide (PPSV23) vaccine. One dose is recommended after age 34. Talk to your health care provider about which screenings and vaccines you need and how often you need them. This information is not intended to replace advice given to you by your health care provider. Make sure you discuss any questions you have with your health care provider. Document Released: 04/04/2015 Document Revised: 11/26/2015 Document Reviewed: 01/07/2015 Elsevier Interactive Patient Education  2017 [employer] Prevention in the Home Falls can cause injuries. They can happen to people of all ages. There are many things you can do to make your home safe and to help prevent falls. What can I do on the outside of my home? Regularly fix the edges of walkways and driveways and fix any cracks. Remove anything that might make you trip as you walk through a door, such as a raised step or threshold. Trim any bushes or trees on the path to your home. Use bright outdoor lighting. Clear any walking paths of anything that might make someone trip, such as rocks or tools. Regularly check to see if handrails are loose or broken. Make sure that both sides of any steps have handrails. Any raised decks and porches should have guardrails on the edges. Have any leaves, snow, or ice cleared regularly. Use sand or salt on walking paths during winter. Clean up any spills in your garage right away. This includes oil or grease spills. What can I do in the bathroom? Use night lights. Install grab bars by the toilet and in the tub and shower. Do not use towel bars as grab bars. Use non-skid mats or decals in the tub or shower. If you need to sit down in the shower, use a plastic, non-slip stool. Keep  the floor dry. Clean up any water that spills on the floor as soon as it happens. Remove soap buildup in the tub or shower regularly. Attach bath mats securely with double-sided non-slip rug tape. Do not have throw rugs and other things on the floor that can make you trip. What can I do in the bedroom? Use night lights. Make sure that you have a light by your bed that is easy to reach. Do not use any sheets or blankets that are too big for your bed. They should not hang down onto the floor. Have a firm chair that has side arms. You can use this for support while you get dressed. Do not have throw rugs and other things on the floor that can make you trip. What can I do in the kitchen? Clean up any spills right away. Avoid walking on wet floors. Keep items that you use a lot in easy-to-reach places. If you need to reach something above you, use a strong step stool that has a grab bar. Keep electrical cords out of the way. Do not use floor polish or wax that makes floors slippery. If you must use wax, use non-skid floor wax. Do not have throw rugs and other things on the floor that can make you trip. What can I do with my stairs? Do not leave any items on the stairs. Make sure that there are handrails on  both sides of the stairs and use them. Fix handrails that are broken or loose. Make sure that handrails are as long as the stairways. Check any carpeting to make sure that it is firmly attached to the stairs. Fix any carpet that is loose or worn. Avoid having throw rugs at the top or bottom of the stairs. If you do have throw rugs, attach them to the floor with carpet tape. Make sure that you have a light switch at the top of the stairs and the bottom of the stairs. If you do not have them, ask someone to add them for you. What else can I do to help prevent falls? Wear shoes that: Do not have high heels. Have rubber bottoms. Are comfortable and fit you well. Are closed at the toe. Do not  wear sandals. If you use a stepladder: Make sure that it is fully opened. Do not climb a closed stepladder. Make sure that both sides of the stepladder are locked into place. Ask someone to hold it for you, if possible. Clearly mark and make sure that you can see: Any grab bars or handrails. First and last steps. Where the edge of each step is. Use tools that help you move around (mobility aids) if they are needed. These include: Canes. Walkers. Scooters. Crutches. Turn on the lights when you go into a dark area. Replace any light bulbs as soon as they burn out. Set up your furniture so you have a clear path. Avoid moving your furniture around. If any of your floors are uneven, fix them. If there are any pets around you, be aware of where they are. Review your medicines with your doctor. Some medicines can make you feel dizzy. This can increase your chance of falling. Ask your doctor what other things that you can do to help prevent falls. This information is not intended to replace advice given to you by your health care provider. Make sure you discuss any questions you have with your health care provider. Document Released: 01/02/2009 Document Revised: 08/14/2015 Document Reviewed: 04/12/2014 Elsevier Interactive Patient Education  2017 [employer].

## 2021-03-12 ENCOUNTER — Other Ambulatory Visit: Payer: Self-pay | Admitting: Family Medicine

## 2021-04-01 ENCOUNTER — Other Ambulatory Visit: Payer: Self-pay | Admitting: Family Medicine

## 2021-04-21 ENCOUNTER — Telehealth: Payer: Self-pay | Admitting: Family Medicine

## 2021-04-21 NOTE — Chronic Care Management (AMB) (Signed)
°  Chronic Care Management   Note  04/21/2021 Name: [name] MRN: [id number] DOB: 18-Oct-1933  [name] is a 88 y.o. year old male who is a primary care patient of Pickard, Cammie Mcgee, MD. I reached out to Huntley Dec Demas by phone today in response to a referral sent by [name] PCP, [name], MD.   [name] was given information about Chronic Care Management services today including:  CCM service includes personalized support from designated clinical staff supervised by his physician, including individualized plan of care and coordination with other care providers 24/7 contact phone numbers for assistance for urgent and routine care needs. Service will only be billed when office clinical staff spend 20 minutes or more in a month to coordinate care. Only one practitioner may furnish and bill the service in a calendar month. The patient may stop CCM services at any time (effective at the end of the month) by phone call to the office staff.   Patient agreed to services and verbal consent obtained.   Follow up plan:   Tatjana Secretary/administrator

## 2021-05-01 NOTE — Progress Notes (Unsigned)
Chronic Care Management Pharmacy Note  05/01/2021 Name:  [name] MRN:  [id number] DOB:  1933-03-28  Summary: ***  Recommendations/Changes made from today's visit: ***  Plan: ***   [name] who is a primary patient of Pickard, Cammie Mcgee, MD.  The CCM team was consulted for assistance with disease management and care coordination needs.    Engaged with patient by telephone for initial visit in response to provider referral for pharmacy case management and/or care coordination services.   Consent to Services:  The patient was given the following information about Chronic Care Management services today, agreed to services, and gave verbal consent: 1. CCM service includes personalized support from designated clinical staff supervised by the primary care provider, including individualized plan of care and coordination with other care providers 2. 24/7 contact phone numbers for assistance for urgent and routine care needs. 3. Service will only be billed when office clinical staff spend 20 minutes or more in a month to coordinate care. 4. Only one practitioner may furnish and bill the service in a calendar month. 5.The patient may stop CCM services at any time (effective at the end of the month) by phone call to the office staff. 6. The patient will be responsible for cost sharing (co-pay) of up to 20% of the service fee (after annual deductible is met). Patient agreed to services and consent obtained.  Patient Care Team: [name], MD as PCP - General (Family Medicine) Minus Breeding, MD as PCP - Cardiology (Cardiology) Edythe Clarity, [facility] as Pharmacist (Pharmacist)  Recent office visits:  01/08/2021 OV (PCP) [name], MD; Patient is not certain of his medication list.  I did ask him to increase losartan to 100 mg and then recheck blood pressure next week.    Recent consult visits:  12/10/2020 OV (Cardiology)  [name], MD; no medication changes indicated.   11/12/2020 OV (Cardiology) [name], NP; no medication changes indicated.   Hospital visits:  None in previous 6 months  Objective:  Lab Results  Component Value Date   CREATININE 0.93 08/29/2020   BUN 19 08/29/2020   EGFR 82 07/29/2020   GFRNONAA >60 08/29/2020   GFRAA 76 07/15/2020   NA 134 (L) 08/29/2020   K 3.9 08/29/2020   CALCIUM 9.1 08/29/2020   CO2 26 08/29/2020   GLUCOSE 120 (H) 08/29/2020    Lab Results  Component Value Date/Time   HGBA1C 6.3 (H) 06/30/2020 08:38 AM   HGBA1C 5.6 01/17/2020 03:46 PM   MICROALBUR 1.1 07/24/2015 09:27 AM   MICROALBUR 0.5 11/15/2014 09:15 AM    Last diabetic Eye exam:  Lab Results  Component Value Date/Time   HMDIABEYEEXA No Retinopathy 09/26/2017 12:00 AM    Last diabetic Foot exam: No results found for: HMDIABFOOTEX   Lab Results  Component Value Date   CHOL 179 05/30/2018   HDL 50 05/30/2018   LDLCALC 103 (H) 05/30/2018   TRIG 131 05/30/2018   CHOLHDL 3.6 05/30/2018    Hepatic Function Latest Ref Rng & Units 07/15/2020 07/10/2020 07/07/2020  Total Protein 6.1 - 8.1 g/dL 6.3 5.2(L) 5.2(L)  Albumin 3.5 - 5.0 g/dL - 3.0(L) 3.0(L)  AST 10 - 35 U/L 20 16 16   ALT 9 - 46 U/L 17 15 15   Alk Phosphatase 38 - 126 U/L - 60 62  Total Bilirubin 0.2 - 1.2 mg/dL 0.4 0.4 0.4  Bilirubin, Direct 0.0 - 0.2  mg/dL - - -    Lab Results  Component Value Date/Time   TSH 3.41 07/15/2020 03:32 PM   TSH 4.954 (H) 07/08/2020 10:27 AM   TSH 2.885 06/29/2020 09:00 PM   TSH 4.07 05/03/2019 02:15 PM    CBC Latest Ref Rng & Units 08/29/2020 07/29/2020 07/15/2020  WBC 4.0 - 10.5 K/uL 6.6 5.7 5.7  Hemoglobin 13.0 - 17.0 g/dL 11.6(L) 11.7(L) 11.6(L)  Hematocrit 39.0 - 52.0 % 34.8(L) 33.9(L) 34.8(L)  Platelets 150 - 400 K/uL 257 247 306    No results found for: VD25OH  Clinical ASCVD: {YES/NO:21197} The ASCVD Risk score (Arnett DK, et al., 2019) failed to calculate for the  following reasons:   The 2019 ASCVD risk score is only valid for ages 20 to 24   The patient has a prior MI or stroke diagnosis    Depression screen [facility] 2/9 03/06/2021 07/19/2018 02/13/2018  Decreased Interest 0 0 0  Down, Depressed, Hopeless 0 - 0  PHQ - 2 Score 0 0 0  Some recent data might be hidden     ***Other: (CHADS2VASc if Afib, MMRC or CAT for COPD, ACT, DEXA)  Social History   Tobacco Use  Smoking Status Former   Years: 5.00   Types: Cigarettes   Quit date: 08/19/1956   Years since quitting: 64.7  Smokeless Tobacco Former   Types: Chew   BP Readings from Last 3 Encounters:  01/08/21 (!) 180/78  12/10/20 138/70  11/12/20 130/80   Pulse Readings from Last 3 Encounters:  01/08/21 (!) 54  12/10/20 60  11/12/20 76   Wt Readings from Last 3 Encounters:  03/06/21 182 lb (82.6 kg)  01/08/21 182 lb (82.6 kg)  12/10/20 180 lb 6.4 oz (81.8 kg)   BMI Readings from Last 3 Encounters:  03/06/21 27.67 kg/m  01/08/21 27.67 kg/m  12/10/20 27.43 kg/m    Assessment/Interventions: Review of patient past medical history, allergies, medications, health status, including review of consultants reports, laboratory and other test data, was performed as part of comprehensive evaluation and provision of chronic care management services.   SDOH:  (Social Determinants of Health) assessments and interventions performed: {yes/no:20286}  SDOH Screenings   Alcohol Screen: Low Risk    Last Alcohol Screening Score (AUDIT): 0  Depression (PHQ2-9): Low Risk    PHQ-2 Score: 0  Financial Resource Strain: Low Risk    Difficulty of Paying Living Expenses: Not hard at all  Food Insecurity: No Food Insecurity   Worried About Charity fundraiser in the Last Year: Never true   Ran Out of Food in the Last Year: Never true  Housing: Low Risk    Last Housing Risk Score: 0  Physical Activity: Insufficiently Active   Days of Exercise per Week: 3 days   Minutes of Exercise per Session: 20 min   Social Connections: Engineer, building services of Communication with Friends and Family: More than three times a week   Frequency of Social Gatherings with Friends and Family: More than three times a week   Attends Religious Services: 1 to 4 times per year   Active Member of [employer] or Organizations: Yes   Attends Archivist Meetings: 1 to 4 times per year   Marital Status: Married  Stress: No Stress Concern Present   Feeling of Stress : Not at all  Tobacco Use: Medium Risk   Smoking Tobacco Use: Former   Smokeless Tobacco Use: Former   Passive Exposure: Not on file  Transportation Needs: No Data processing manager (Medical): No   Lack of Transportation (Non-Medical): No    CCM Care Plan  Allergies  Allergen Reactions   Penicillins     Unknown reaction    Medications Reviewed Today     Reviewed by Chriss Driver, LPN (Licensed Practical Nurse) on 03/06/21 at 53  Med List Status: <None>   Medication Order Taking? Sig Documenting Provider Last Dose Status Informant  acetaminophen (TYLENOL) 325 MG tablet [id number] Yes Take 1-2 tablets (325-650 mg total) by mouth every 4 (four) hours as needed for mild pain. Bary Leriche, PA-C Taking Active Self  albuterol (VENTOLIN HFA) 108 (90 Base) MCG/ACT inhaler [id number] Yes Inhale 2 puffs into the lungs every 4 (four) hours as needed for wheezing or shortness of breath. [name], MD Taking Active   atorvastatin (LIPITOR) 80 MG tablet [id number] Yes TAKE 1 TABLET BY MOUTH EVERY DAY AT 6 PM [name], MD Taking Active   carbamazepine (CARBATROL) 300 MG 12 hr capsule [id number] Yes TAKE 1 CAPSULE(300 MG) BY MOUTH TWICE DAILY [name], MD Taking Active   cloNIDine (CATAPRES) 0.2 MG tablet [id number] Yes TAKE 1 TABLET(0.2 MG) BY MOUTH TWICE DAILY [name], MD Taking Active Self  escitalopram (LEXAPRO) 5 MG tablet [id number] Yes TAKE 1 TABLET BY MOUTH EVERY DAY AT BEDTIME  [name], MD Taking Active   furosemide (LASIX) 20 MG tablet [id number] Yes TAKE 1/2 TABLET(10 MG) BY MOUTH DAILY [name], MD Taking Active   glucose blood test strip [id number] Yes 1 each by Other route as needed. Use as instructed Buelah Manis, Modena Nunnery, MD Taking Active Self  glucose monitoring kit (FREESTYLE) monitoring kit [id number] Yes 1 each by Does not apply route as needed. Alycia Rossetti, MD Taking Active Self  HYDROcodone-acetaminophen [facility]) 5-325 MG tablet [id number] Yes Take 1 tablet by mouth every 6 (six) hours as needed for moderate pain. [name], MD Taking Active   isosorbide mononitrate (IMDUR) 60 MG 24 hr tablet [id number] Yes Take 0.5 tablets (30 mg total) by mouth daily. Lorella Nimrod, MD Taking Active Self  Lancets (ACCU-CHEK SAFE-T PRO) lancets [id number] Yes 1 each by Other route as needed. Use as instructed Alycia Rossetti, MD Taking Active Self  levETIRAcetam (KEPPRA) 250 MG tablet [id number] Yes TAKE 1 TABLET(250 MG) BY MOUTH TWICE DAILY [name], MD Taking Active   levothyroxine (SYNTHROID) 75 MCG tablet [id number] Yes TAKE 1 TABLET(75 MCG) BY MOUTH DAILY [name], MD Taking Active   losartan (COZAAR) 100 MG tablet [id number] Yes Take 0.5 tablets (50 mg total) by mouth daily. Lorella Nimrod, MD Taking Active Self  Multiple Vitamins-Minerals (MULTIVITAMINS THER. W/MINERALS) Sheral Flow [id number] Yes Take 1 tablet by mouth at bedtime.   [provider] Taking Active Self  MYRBETRIQ 50 MG TB24 tablet [id number] Yes Take 50 mg by mouth daily. [provider] Taking Active Self  rivaroxaban (XARELTO) 20 MG TABS tablet [id number] Yes Take 1 tablet (20 mg total) by mouth daily with supper. [name], MD Taking Active   tamsulosin (FLOMAX) 0.4 MG CAPS capsule [id number] Yes TAKE 1 CAPSULE(0.4 MG) BY MOUTH DAILY AFTER SUPPER [name], MD Taking Active             Patient Active Problem List   Diagnosis Date Noted    Pain in joint of right knee 12/01/2020   Bradycardia 07/29/2020   Second degree AV  block, Mobitz type II    Syncope 07/08/2020   Acute midline low back pain without sciatica    Lumbar disc disease with radiculopathy 06/30/2020   Weakness of both lower extremities 06/30/2020   Chronic diastolic HF (heart failure) ([place]) 01/16/2020   Acute exacerbation of CHF (congestive heart failure) ([place]) 08/19/2018   Hypertensive urgency 08/19/2018   Gait disorder 07/13/2018   Recurrent syncope 07/13/2018   E. coli UTI 06/19/2018   Leucocytosis 06/19/2018   Normal pressure hydrocephalus ([place]) 06/01/2018   Altered mental status 05/31/2018   AMS (altered mental status) 05/30/2018   Seizure disorder ([place]) 05/30/2018   Fracture of proximal phalanx of finger fifth 06/17/2017 07/21/2017   Upper airway cough syndrome 11/01/2016   Atrial flutter ([place]) 08/05/2016   Coronary artery disease involving native coronary artery of native heart without angina pectoris 08/05/2016   Chronic anticoagulation 12/31/2015   History of seizures 20/60/1561   Acute diastolic CHF (congestive heart failure) ([place]) 12/24/2015   Pulmonary edema    Paroxysmal atrial fibrillation ([place])    Hypothyroidism    Controlled type 2 diabetes mellitus with complication, without long-term current use of insulin ([place])    Hypertensive emergency 12/20/2015   Special screening for malignant neoplasms, colon 12/07/2013   Bowel habit changes 12/07/2013   Diarrhea 03/09/2013   History of cardioembolic cerebrovascular accident (CVA) 01/31/2013   Chest pain 01/29/2013   Bilateral carotid artery disease ([place])    Loss of coordination 12/03/2012   Morbid obesity due to excess calories ([place]) 08/20/2011   CAD S/P percutaneous coronary angioplasty    Dyslipidemia    HTN (hypertension)     Immunization History  Administered Date(s) Administered   Fluad Quad(high Dose 65+) 12/12/2018, 01/17/2020   Influenza, High Dose Seasonal PF 12/09/2016,  01/31/2018   Influenza,inj,Quad PF,6+ Mos 12/04/2012, 01/01/2014, 01/15/2015, 12/22/2015   PFIZER(Purple Top)SARS-COV-2 Vaccination 04/29/2019, 05/23/2019   Pneumococcal Conjugate-13 03/01/2013   Pneumococcal Polysaccharide-23 02/03/1999, 02/27/2010   Td 08/16/1996   Tdap 07/31/2014    Conditions to be addressed/monitored:  CAD, HTN, CHF, Afib, Hypothyroidism, Type II DM, Dyslipidemia  There are no care plans that you recently modified to display for this patient.    Medication Assistance: {MEDASSISTANCEINFO:25044}  Compliance/Adherence/Medication fill history: Care Gaps: ***  Star-Rating Drugs: ***  Patient's preferred pharmacy is:  [facility] [phone] - [place], [place] [address] [place] [address] Phone: [phone] Fax: [phone]  [id number] [address] [place]: [phone]: [phone] [place] [place] [address] [place]: [phone]: [phone] Uses pill box? {Yes or If no, why not?:20788} Pt endorses ***% compliance  We discussed: {Pharmacy options:24294} Patient decided to: {US Pharmacy Plan:23885}  Care Plan and Follow Up Patient Decision:  {FOLLOWUP:24991}  Plan: {CM FOLLOW UP PGFQM:21031} ***  Current Barriers:  {pharmacybarriers:24917}  Pharmacist Clinical Goal(s):  Patient will {PHARMACYGOALCHOICES:24921} through collaboration with PharmD and provider.   Interventions: 1:1 collaboration with PSusy Frizzle MD regarding development and update of comprehensive plan of care as evidenced by provider attestation and co-signature Inter-disciplinary care team collaboration (see longitudinal plan of care) Comprehensive medication review performed; medication list updated in electronic  medical record  Hypertension (BP goal {CHL HP UPSTREAM Pharmacist BP [phone]}) -{US controlled/uncontrolled:25276} -Current treatment: *** -Medications previously  tried: ***  -Current home readings: *** -Current dietary habits: *** -Current exercise habits: *** -{ACTIONS;DENIES/REPORTS:21021675::"Denies"} hypotensive/hypertensive symptoms -Educated on {CCM BP Counseling:25124} -Counseled to monitor BP at home ***, document, and provide log at future appointments -{CCMPHARMDINTERVENTION:25122}  Hyperlipidemia: (LDL goal < ***) -{US controlled/uncontrolled:25276} -Current treatment: *** -Medications previously tried: ***  -Current dietary patterns: *** -Current exercise habits: *** -Educated on {CCM HLD Counseling:25126} -{CCMPHARMDINTERVENTION:25122}  Diabetes (A1c goal {A1c goals:23924}) -{US controlled/uncontrolled:25276} -Current medications: *** -Medications previously tried: ***  -Current home glucose readings fasting glucose: *** post prandial glucose: *** -{ACTIONS;DENIES/REPORTS:21021675::"Denies"} hypoglycemic/hyperglycemic symptoms -Current meal patterns:  breakfast: ***  lunch: ***  dinner: *** snacks: *** drinks: *** -Current exercise: *** -Educated on {CCM DM COUNSELING:25123} -Counseled to check feet daily and get yearly eye exams -{CCMPHARMDINTERVENTION:25122}  Atrial Fibrillation (Goal: prevent stroke and major bleeding) -{US controlled/uncontrolled:25276} -CHADSVASC: *** -Current treatment: Rate control: *** Anticoagulation: *** -Medications previously tried: *** -Home BP and HR readings: ***  -Counseled on {CCMAFIBCOUNSELING:25120} -{CCMPHARMDINTERVENTION:25122}  Heart Failure (Goal: manage symptoms and prevent exacerbations) -{US controlled/uncontrolled:25276} -Last ejection fraction: *** (Date: ***) -HF type: {type of heart failure:30421350} -NYHA Class: {CHL HP Upstream Pharm NYHA Class:[phone]} -AHA HF Stage: {CHL HP  Upstream Pharm AHA HF Stage:[id number]} -Current treatment: *** -Medications previously tried: ***  -Current home BP/HR readings: *** -Current dietary habits: *** -Current exercise habits: *** -Educated on {CCM HF Counseling:25125} -{CCMPHARMDINTERVENTION:25122}  Hypothyroidism (Goal: ***) -{US controlled/uncontrolled:25276} -Current treatment  *** -Medications previously tried: ***  -{CCMPHARMDINTERVENTION:25122}  Patient Goals/Self-Care Activities Patient will:  - {pharmacypatientgoals:24919}  Follow Up Plan: {CM FOLLOW UP GPCW:19694}

## 2021-05-07 ENCOUNTER — Telehealth: Payer: Self-pay | Admitting: Pharmacist

## 2021-05-07 NOTE — Progress Notes (Signed)
Chronic Care Management Pharmacy Assistant   Name: [name]  MRN: [id number] DOB: 1933/09/11   Reason for Encounter: Chart Review For Initial Visit With Clinical Pharmacist   Conditions to be addressed/monitored: CAD, HTN, PAF, CHF, Hypothyroidism, HLD,   Primary concerns for visit include: HTN   Recent office visits:  01/08/2021 OV (PCP) [name], MD; Patient is not certain of his medication list.  I did ask him to increase losartan to 100 mg and then recheck blood pressure next week.   Recent consult visits:  12/10/2020 OV (Cardiology) [name], MD; no medication changes indicated.  11/12/2020 OV (Cardiology) [name], NP; no medication changes indicated.  Hospital visits:  None in previous 6 months  Medications: Outpatient Encounter Medications as of 05/07/2021  Medication Sig   acetaminophen (TYLENOL) 325 MG tablet Take 1-2 tablets (325-650 mg total) by mouth every 4 (four) hours as needed for mild pain.   albuterol (VENTOLIN HFA) 108 (90 Base) MCG/ACT inhaler Inhale 2 puffs into the lungs every 4 (four) hours as needed for wheezing or shortness of breath.   atorvastatin (LIPITOR) 80 MG tablet TAKE 1 TABLET BY MOUTH EVERY DAY AT 6 PM   carbamazepine (CARBATROL) 300 MG 12 hr capsule TAKE 1 CAPSULE(300 MG) BY MOUTH TWICE DAILY   cloNIDine (CATAPRES) 0.2 MG tablet TAKE 1 TABLET(0.2 MG) BY MOUTH TWICE DAILY   escitalopram (LEXAPRO) 5 MG tablet TAKE 1 TABLET BY MOUTH EVERY DAY AT BEDTIME   furosemide (LASIX) 20 MG tablet TAKE 1/2 TABLET(10 MG) BY MOUTH DAILY   glucose blood test strip 1 each by Other route as needed. Use as instructed   glucose monitoring kit (FREESTYLE) monitoring kit 1 each by Does not apply route as needed.   HYDROcodone-acetaminophen (NORCO) 5-325 MG tablet Take 1 tablet by mouth every 6 (six) hours as needed for moderate pain. (Patient not taking: Reported on 03/06/2021)   isosorbide mononitrate (IMDUR) 60 MG 24 hr tablet  Take 0.5 tablets (30 mg total) by mouth daily.   Lancets (ACCU-CHEK SAFE-T PRO) lancets 1 each by Other route as needed. Use as instructed   levETIRAcetam (KEPPRA) 250 MG tablet TAKE 1 TABLET(250 MG) BY MOUTH TWICE DAILY   levothyroxine (SYNTHROID) 75 MCG tablet TAKE 1 TABLET(75 MCG) BY MOUTH DAILY   losartan (COZAAR) 100 MG tablet Take 0.5 tablets (50 mg total) by mouth daily.   Multiple Vitamins-Minerals (MULTIVITAMINS THER. W/MINERALS) TABS Take 1 tablet by mouth at bedtime.     MYRBETRIQ 50 MG TB24 tablet Take 50 mg by mouth daily.   rivaroxaban (XARELTO) 20 MG TABS tablet Take 1 tablet (20 mg total) by mouth daily with supper.   tamsulosin (FLOMAX) 0.4 MG CAPS capsule TAKE 1 CAPSULE(0.4 MG) BY MOUTH DAILY AFTER SUPPER   No facility-administered encounter medications on file as of 05/07/2021.   Current Mediations: Levothyroxine 75 mcg last filled 04/01/2021 90 DS Escitalopram 5 mg last filled 03/12/2021 90 DS Xarelto 20 mg last filled 04/23/2021 30 DS Levetiracetam 250 mg last filled 02/25/2021 90 DS Tamsulosin 0.4 mg last filled 02/16/2021 90 DS Carbamazepine 300 mg last filled 04/20/2021 90 DS Atorvastatin 80 mg last filled 04/19/2021 90 DS Furosemide 20 mg last filled 03/01/2021 90 DS Myrbetriq 50 mg last filled 04/23/2021 30 DS Hydrocodone-Acetaminophen 5-325 mg Losartan 100 mg last filled 03/25/2021 90 DS Isosorbide 60 mg last filed 04/01/2021 90 DS Clonidine 0.2 mg last filled 04/01/2021 90 DS Albuterol 108 mcg Glucose blood test strip Lancets (  Accu-chek safe-T pro) Freestyle monitoring kit Acetaminophen 325 mg Multiple Vitamins-Minerals  -Patient is unsure of his medications. He states his daughter helps manage them for him. He is going to get a list together before his appointment with CPP.  Patient Questions: Any changes in your medications or health? Patient states he has not had any recent changes in his medications or health.  Any side effects from any  medications?  Patient denies having any side effects from any of his medications.  Do you have any symptoms or problems not managed by your medications? Patient denies having any symptoms or problems that is not currently managed by his medications.  Any concerns about your health right now? Patient states he is concerned with his high blood pressure. He states he is unable to get it controlled. He is going to see [name] tomorrow on 05/08/2021 to recheck his blood pressure.  Has your provider asked that you check blood pressure, blood sugar, or follow special diet at home? He checks his blood pressure, he does not check blood sugar. "I made my own diet by eating low calorie foods and I've lost some weight."  Do you get any type of exercise on a regular basis? "Very little, my legs go out from underneath me and I have to use a walker."  Can you think of a goal you would like to reach for your health? Patient states he would like to get around better.  Do you have any problems getting your medications? "Some are expensive but not too bad."  Is there anything that you would like to discuss during the appointment?  Patient states he would like to discuss his blood pressure.  Please bring medications and supplements to appointment  Care Gaps: Medicare Annual Wellness: Completed 03/06/2021 Ophthalmology Exam: Overdue since 09/27/2018 Foot Exam: Overdue since 03/19/2020 Hemoglobin A1C: 6.3% on 06/30/2020 Colonoscopy: Completed on 12/19/2013  Future Appointments  Date Time Provider [place]  05/08/2021 11:00 AM [name], MD [facility] None  05/14/2021 11:00 AM [facility] PHARMACIST [facility] None  05/15/2021  1:40 PM Minus Breeding, MD [facility]  06/01/2021  7:05 AM [facility] DEVICE REMOTES [facility]  08/31/2021  7:05 AM [facility] DEVICE REMOTES [facility]  11/30/2021  7:05 AM [facility] DEVICE REMOTES [facility]   03/01/2022  7:05 AM [facility] DEVICE REMOTES [facility]  03/12/2022 12:00 PM B[facility]-NURSE HEALTH ADVISOR [facility] None   Star Rating Drugs: Atorvastatin 80 mg last filled 04/19/2021 90 DS Losartan 100 mg last filled 03/25/2021 90 DS  April D Calhoun, [place] Pharmacist Assistant [phone]

## 2021-05-08 ENCOUNTER — Other Ambulatory Visit: Payer: Self-pay

## 2021-05-08 ENCOUNTER — Ambulatory Visit (INDEPENDENT_AMBULATORY_CARE_PROVIDER_SITE_OTHER): Payer: Medicare Other | Admitting: Family Medicine

## 2021-05-08 ENCOUNTER — Encounter: Payer: Self-pay | Admitting: Family Medicine

## 2021-05-08 VITALS — BP 152/72 | HR 73 | Temp 96.6°F | Resp 18 | Ht 68.0 in | Wt 180.0 lb

## 2021-05-08 DIAGNOSIS — E039 Hypothyroidism, unspecified: Secondary | ICD-10-CM | POA: Diagnosis not present

## 2021-05-08 DIAGNOSIS — I480 Paroxysmal atrial fibrillation: Secondary | ICD-10-CM | POA: Diagnosis not present

## 2021-05-08 DIAGNOSIS — E118 Type 2 diabetes mellitus with unspecified complications: Secondary | ICD-10-CM | POA: Diagnosis not present

## 2021-05-08 DIAGNOSIS — I10 Essential (primary) hypertension: Secondary | ICD-10-CM | POA: Diagnosis not present

## 2021-05-08 NOTE — Progress Notes (Signed)
Subjective:    Patient ID: [name], male    DOB: [birth year], 88 y.o.   MRN: [id number] Patient presents today to discuss high blood pressure.  He states that for the last week, his blood pressures been running very high.  He states he seen his systolic blood pressure as high as 190.  His diastolic blood pressure has been under 80.  However we reviewed his medication list with him today.  He has not been taking losartan.  He just started back on the losartan this morning.  Has had 1 dose of 50 mg losartan just a few hours ago.  His blood pressure today is 152/72.  He denies any chest pain shortness of breath or dyspnea on exertion.  He denies any orthopnea or paroxysmal nocturnal dyspnea.  There is no pitting edema in his extremities and his lungs are clear to auscultation bilaterally. Past Medical History:  Diagnosis Date   Atrial flutter ([place])    BPH (benign prostatic hyperplasia)    Coronary artery disease    a. s/p MI tx with Cypher DES to pRCA in 4/04;  b. Echocardiogram 7/09: Normal LV function.  c. Nuclear study 3/13 no ischemia;  d. ETT 2/14 neg;  e. admx with CP => LHC (01/30/2013):  pLAD 40-50%, oD1 70-80%, oOM1 40%, pRCA stent patent, mid RCA 30%, EF 60-65%. => med Rx.   DDD (degenerative disc disease)    Diabetes mellitus without complication ([place])    Dyslipidemia    Gait disorder 07/13/2018   Hemorrhoids    Hx MRSA infection    left buttocks abscess   Hx of echocardiogram    a. Echocardiogram (01/31/2013): Mild focal basal and mild concentric hypertrophy of the septum, EF 50-55%, normal wall motion, grade 1 diastolic dysfunction, trivial AI, MAC, mild LAE, PASP 35   Hyperlipidemia    Hypertension    Hypothyroidism    Meniere's disease    Status post shunt   Myocardial infarction [facility]) 2008   Occlusion and stenosis of carotid artery without mention of cerebral infarction    40-59% on carotid doppler 2014; [place] (01/2013): R 1-39%, L 60-79%   Rotator cuff injury    chronic  rotator cuff injury status post repair   Seizure disorder ([place])    Stroke ([place])    a. 01/2013=> post cardiac cath CVA to L post communicating artery system; R sided weakness   Syncope    Past Surgical History:  Procedure Laterality Date   COLONOSCOPY     CORONARY ANGIOPLASTY WITH STENT PLACEMENT  07/14/2002   Stent to the right coronary artery   ENDOLYMPHATIC SHUNT DECOMPRESSION  06/11/2009    Right endolymphatic sac decompression and shunt  placement   HERNIA REPAIR  04/10/2008   scrotal hernia repair   LEFT HEART CATHETERIZATION WITH CORONARY ANGIOGRAM N/A 01/30/2013   Procedure: LEFT HEART CATHETERIZATION WITH CORONARY ANGIOGRAM;  Surgeon: Larey Dresser, MD;  Location: [facility] CATH LAB;  Service: Cardiovascular;  Laterality: N/A;   PACEMAKER IMPLANT N/A 08/29/2020   Procedure: PACEMAKER IMPLANT;  Surgeon: [name], MD;  Location: [place] CV LAB;  Service: Cardiovascular;  Laterality: N/A;   ROTATOR CUFF REPAIR     for chronic rotator cuff injury   Current Outpatient Medications on File Prior to Visit  Medication Sig Dispense Refill   acetaminophen (TYLENOL) 325 MG tablet Take 1-2 tablets (325-650 mg total) by mouth every 4 (four) hours as needed for mild pain.     albuterol (VENTOLIN  HFA) 108 (90 Base) MCG/ACT inhaler Inhale 2 puffs into the lungs every 4 (four) hours as needed for wheezing or shortness of breath. 18 g 11   atorvastatin (LIPITOR) 80 MG tablet TAKE 1 TABLET BY MOUTH EVERY DAY AT 6 PM 90 tablet 3   carbamazepine (CARBATROL) 300 MG 12 hr capsule TAKE 1 CAPSULE(300 MG) BY MOUTH TWICE DAILY 180 capsule 3   cloNIDine (CATAPRES) 0.2 MG tablet TAKE 1 TABLET(0.2 MG) BY MOUTH TWICE DAILY 180 tablet 3   escitalopram (LEXAPRO) 5 MG tablet TAKE 1 TABLET BY MOUTH EVERY DAY AT BEDTIME 90 tablet 0   furosemide (LASIX) 20 MG tablet TAKE 1/2 TABLET(10 MG) BY MOUTH DAILY 45 tablet 2   glucose blood test strip 1 each by Other route as needed. Use as instructed 100 each 3    glucose monitoring kit (FREESTYLE) monitoring kit 1 each by Does not apply route as needed. 1 each 0   HYDROcodone-acetaminophen (NORCO) 5-325 MG tablet Take 1 tablet by mouth every 6 (six) hours as needed for moderate pain. (Patient not taking: Reported on 03/06/2021) 15 tablet 0   isosorbide mononitrate (IMDUR) 60 MG 24 hr tablet Take 0.5 tablets (30 mg total) by mouth daily. 90 tablet 3   Lancets (ACCU-CHEK SAFE-T PRO) lancets 1 each by Other route as needed. Use as instructed 100 each 3   levETIRAcetam (KEPPRA) 250 MG tablet TAKE 1 TABLET(250 MG) BY MOUTH TWICE DAILY 180 tablet 1   levothyroxine (SYNTHROID) 75 MCG tablet TAKE 1 TABLET(75 MCG) BY MOUTH DAILY 90 tablet 0   losartan (COZAAR) 100 MG tablet Take 0.5 tablets (50 mg total) by mouth daily. 90 tablet 1   Multiple Vitamins-Minerals (MULTIVITAMINS THER. W/MINERALS) TABS Take 1 tablet by mouth at bedtime.       MYRBETRIQ 50 MG TB24 tablet Take 50 mg by mouth daily.     rivaroxaban (XARELTO) 20 MG TABS tablet Take 1 tablet (20 mg total) by mouth daily with supper. 90 tablet 3   tamsulosin (FLOMAX) 0.4 MG CAPS capsule TAKE 1 CAPSULE(0.4 MG) BY MOUTH DAILY AFTER SUPPER 90 capsule 0   No current facility-administered medications on file prior to visit.   Allergies  Allergen Reactions   Penicillins     Unknown reaction   Social History   Socioeconomic History   Marital status: Married    Spouse name: Not on file   Number of children: 4   Years of education: 12th   Highest education level: Not on file  Occupational History   Occupation: Retired  Tobacco Use   Smoking status: Former    Years: 5.00    Types: Cigarettes    Quit date: 08/19/1956    Years since quitting: 64.7   Smokeless tobacco: Former    Types: Nurse, children's Use: Never used  Substance and Sexual Activity   Alcohol use: No   Drug use: No   Sexual activity: Never  Other Topics Concern   Not on file  Social History Narrative   Lives with wife.    Children live nearby and help with care.    Social Determinants of Health   Financial Resource Strain: Low Risk    Difficulty of Paying Living Expenses: Not hard at all  Food Insecurity: No Food Insecurity   Worried About Charity fundraiser in the Last Year: Never true   Ran Out of Food in the Last Year: Never true  Transportation Needs: No Transportation Needs  Lack of Transportation (Medical): No   Lack of Transportation (Non-Medical): No  Physical Activity: Insufficiently Active   Days of Exercise per Week: 3 days   Minutes of Exercise per Session: 20 min  Stress: No Stress Concern Present   Feeling of Stress : Not at all  Social Connections: Socially Integrated   Frequency of Communication with Friends and Family: More than three times a week   Frequency of Social Gatherings with Friends and Family: More than three times a week   Attends Religious Services: 1 to 4 times per year   Active Member of [employer] or Organizations: Yes   Attends Archivist Meetings: 1 to 4 times per year   Marital Status: Married  Human resources officer Violence: Not At Risk   Fear of Current or Ex-Partner: No   Emotionally Abused: No   Physically Abused: No   Sexually Abused: No     Review of Systems  All other systems reviewed and are negative.     Objective:   Physical Exam Vitals reviewed.  Constitutional:      General: He is not in acute distress.    Appearance: Normal appearance. He is obese. He is not ill-appearing, toxic-appearing or diaphoretic.  Neck:     Vascular: No carotid bruit.  Cardiovascular:     Rate and Rhythm: Normal rate and regular rhythm.     Heart sounds: Normal heart sounds.    No friction rub. No gallop.  Pulmonary:     Effort: Pulmonary effort is normal. No respiratory distress.     Breath sounds: Normal breath sounds. No stridor. No wheezing, rhonchi or rales.  Musculoskeletal:     Right lower leg: No edema.     Left lower leg: No edema.   Lymphadenopathy:     Cervical: No cervical adenopathy.  Neurological:     Mental Status: He is alert.         Assessment & Plan:  Essential hypertension - Plan: CBC with Differential/Platelet, Lipid panel, COMPLETE METABOLIC PANEL WITH GFR  Paroxysmal atrial fibrillation (HCC)  Hypothyroidism, unspecified type - Plan: TSH  Controlled type 2 diabetes mellitus with complication, without long-term current use of insulin (HCC) - Plan: CBC with Differential/Platelet, Lipid panel, COMPLETE METABOLIC PANEL WITH GFR, Hemoglobin A1c After reviewing his medication list, I believe the reason his blood pressure is high is because he has not been taking losartan.  I have asked him to resume the losartan 50 mg a day and recheck blood pressure here in 1 week.  If persistently high at that point I will increase losartan to 100 mg a day.  While the patient is here today I will check a CBC CMP lipid panel TSH and A1c.  Is been quite sometime since he had fasting lab work.  However clinically the patient seems to be doing well and denies any concerns.

## 2021-05-09 LAB — COMPLETE METABOLIC PANEL WITH GFR
AG Ratio: 2 (calc) (ref 1.0–2.5)
ALT: 13 U/L (ref 9–46)
AST: 18 U/L (ref 10–35)
Albumin: 4.1 g/dL (ref 3.6–5.1)
Alkaline phosphatase (APISO): 76 U/L (ref 35–144)
BUN: 24 mg/dL (ref 7–25)
CO2: 26 mmol/L (ref 20–32)
Calcium: 9.3 mg/dL (ref 8.6–10.3)
Chloride: 102 mmol/L (ref 98–110)
Creat: 1.19 mg/dL (ref 0.70–1.22)
Globulin: 2.1 g/dL (calc) (ref 1.9–3.7)
Glucose, Bld: 82 mg/dL (ref 65–99)
Potassium: 4 mmol/L (ref 3.5–5.3)
Sodium: 139 mmol/L (ref 135–146)
Total Bilirubin: 0.4 mg/dL (ref 0.2–1.2)
Total Protein: 6.2 g/dL (ref 6.1–8.1)
eGFR: 60 mL/min/{1.73_m2} (ref >=60)

## 2021-05-09 LAB — CBC WITH DIFFERENTIAL/PLATELET
Absolute Monocytes: 918 cells/uL (ref 200–950)
Basophils Absolute: 74 cells/uL (ref 0–200)
Basophils Relative: 1 %
Eosinophils Absolute: 755 cells/uL — ABNORMAL HIGH (ref 15–500)
Eosinophils Relative: 10.2 %
HCT: 36 % — ABNORMAL LOW (ref 38.5–50.0)
Hemoglobin: 11.9 g/dL — ABNORMAL LOW (ref 13.2–17.1)
Lymphs Abs: 2361 cells/uL (ref 850–3900)
MCH: 31.3 pg (ref 27.0–33.0)
MCHC: 33.1 g/dL (ref 32.0–36.0)
MCV: 94.7 fL (ref 80.0–100.0)
MPV: 10.8 fL (ref 7.5–12.5)
Monocytes Relative: 12.4 %
Neutro Abs: 3293 cells/uL (ref 1500–7800)
Neutrophils Relative %: 44.5 %
Platelets: 234 10*3/uL (ref 140–400)
RBC: 3.8 10*6/uL — ABNORMAL LOW (ref 4.20–5.80)
RDW: 13.3 % (ref 11.0–15.0)
Total Lymphocyte: 31.9 %
WBC: 7.4 10*3/uL (ref 3.8–10.8)

## 2021-05-09 LAB — LIPID PANEL
Cholesterol: 193 mg/dL (ref <200)
HDL: 78 mg/dL (ref >=40)
LDL Cholesterol (Calc): 99 mg/dL (calc)
Non-HDL Cholesterol (Calc): 115 mg/dL (calc) (ref <130)
Total CHOL/HDL Ratio: 2.5 (calc) (ref <5.0)
Triglycerides: 75 mg/dL (ref <150)

## 2021-05-09 LAB — TSH: TSH: 8.39 mIU/L — ABNORMAL HIGH (ref 0.40–4.50)

## 2021-05-09 LAB — HEMOGLOBIN A1C
Hgb A1c MFr Bld: 5.8 % of total Hgb — ABNORMAL HIGH (ref <5.7)
Mean Plasma Glucose: 120 mg/dL
eAG (mmol/L): 6.6 mmol/L

## 2021-05-12 ENCOUNTER — Other Ambulatory Visit: Payer: Self-pay

## 2021-05-12 MED ORDER — LEVOTHYROXINE SODIUM 88 MCG PO TABS: 88.0000 ug | ORAL_TABLET | Freq: Every day | ORAL | 3 refills | Status: DC

## 2021-05-13 ENCOUNTER — Other Ambulatory Visit: Payer: Self-pay | Admitting: Family Medicine

## 2021-05-14 ENCOUNTER — Telehealth: Payer: Medicare Other

## 2021-05-15 ENCOUNTER — Ambulatory Visit: Payer: Medicare Other | Admitting: Cardiology

## 2021-05-30 IMAGING — CR RIGHT HUMERUS - 2+ VIEW
1 series · 2 of 2 positions shown · non-contrast
Comparison: None.

CLINICAL DATA: Right upper arm pain since a fall several days ago.
Initial encounter.

EXAM:
RIGHT HUMERUS - 2+ VIEW

[Series 1: ap · 0.17mm/px · 2 of 2 slices shown]
[im 1/2]
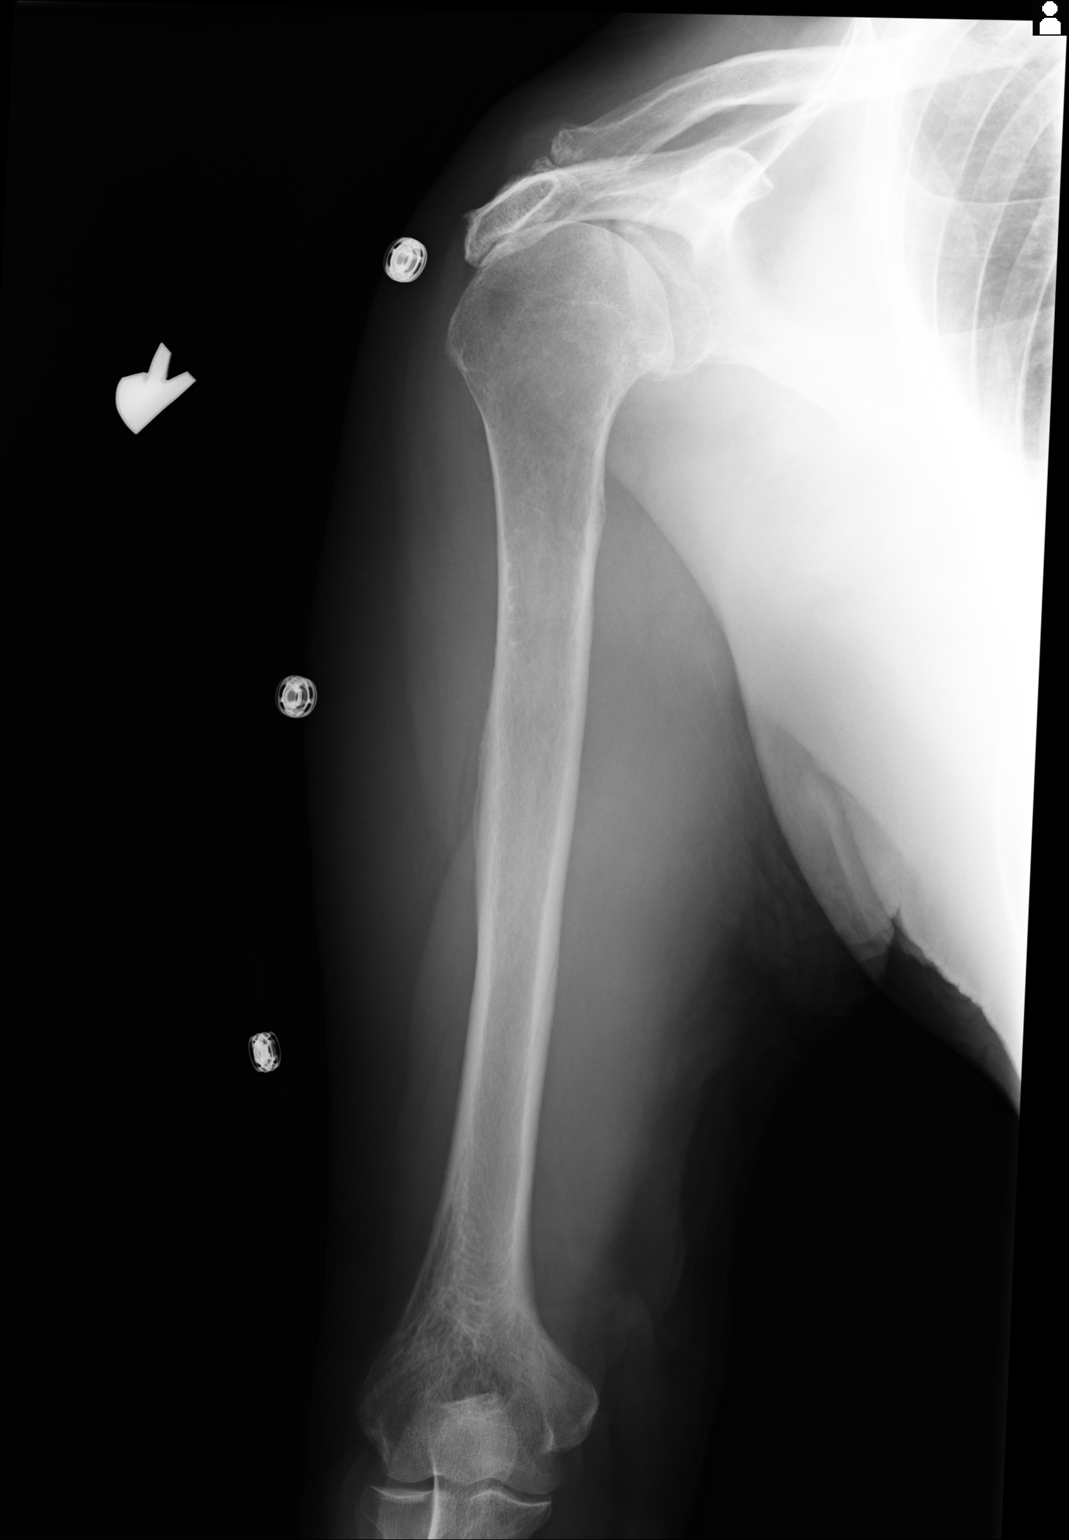
[im 2/2]
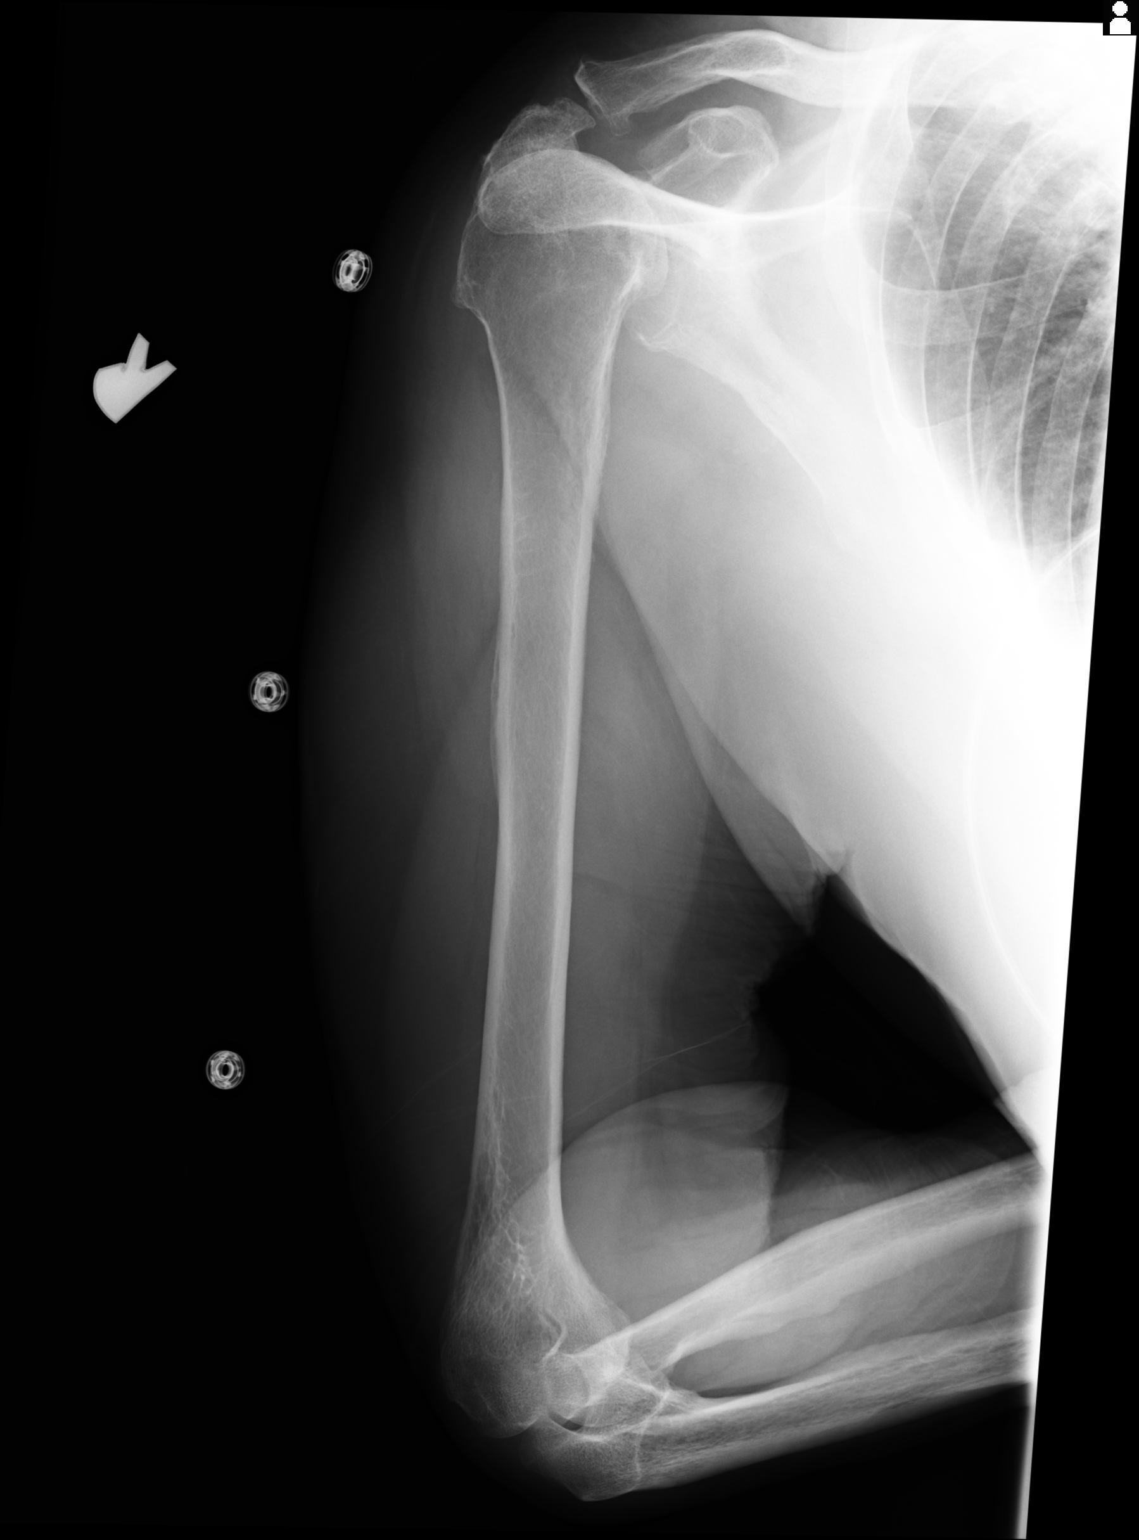

[2 of 2 positions shown; findings below may reference images not displayed]

FINDINGS: There is no acute bony or joint abnormality. Acromioclavicular
osteoarthritis is noted. The humeral head is high-riding suggesting
chronic rotator cuff tear. Soft tissues unremarkable.
IMPRESSION: No acute abnormality.

Acromioclavicular osteoarthritis.

Findings suggestive of chronic rotator cuff tear.

## 2021-06-22 ENCOUNTER — Other Ambulatory Visit: Payer: Self-pay | Admitting: Family Medicine

## 2021-07-01 ENCOUNTER — Ambulatory Visit (INDEPENDENT_AMBULATORY_CARE_PROVIDER_SITE_OTHER): Payer: Medicare Other

## 2021-07-01 DIAGNOSIS — I441 Atrioventricular block, second degree: Secondary | ICD-10-CM

## 2021-07-02 LAB — CUP PACEART REMOTE DEVICE CHECK
Battery Remaining Longevity: 145 mo
Battery Voltage: 3.06 V
Brady Statistic AP VP Percent: 14.16 %
Brady Statistic AP VS Percent: 0.02 %
Brady Statistic AS VP Percent: 85.5 %
Brady Statistic AS VS Percent: 0.32 %
Brady Statistic RA Percent Paced: 14.27 %
Brady Statistic RV Percent Paced: 99.66 %
Date Time Interrogation Session: 20230411182223
Implantable Lead Implant Date: 20220610
Implantable Lead Implant Date: 20220610
Implantable Lead Location: 753859
Implantable Lead Location: 753860
Implantable Lead Model: 3830
Implantable Lead Model: 5076
Implantable Pulse Generator Implant Date: 20220610
Lead Channel Impedance Value: 247 Ohm
Lead Channel Impedance Value: 304 Ohm
Lead Channel Impedance Value: 304 Ohm
Lead Channel Impedance Value: 513 Ohm
Lead Channel Pacing Threshold Amplitude: 0.625 V
Lead Channel Pacing Threshold Amplitude: 0.625 V
Lead Channel Pacing Threshold Pulse Width: 0.4 ms
Lead Channel Pacing Threshold Pulse Width: 0.4 ms
Lead Channel Sensing Intrinsic Amplitude: 1.875 mV
Lead Channel Sensing Intrinsic Amplitude: 1.875 mV
Lead Channel Sensing Intrinsic Amplitude: 14.125 mV
Lead Channel Sensing Intrinsic Amplitude: 14.125 mV
Lead Channel Setting Pacing Amplitude: 1.5 V
Lead Channel Setting Pacing Amplitude: 2 V
Lead Channel Setting Pacing Pulse Width: 0.4 ms
Lead Channel Setting Sensing Sensitivity: 1.2 mV

## 2021-07-05 ENCOUNTER — Other Ambulatory Visit: Payer: Self-pay | Admitting: Family Medicine

## 2021-07-07 ENCOUNTER — Other Ambulatory Visit: Payer: Self-pay | Admitting: Family Medicine

## 2021-07-15 ENCOUNTER — Telehealth: Payer: Self-pay

## 2021-07-15 NOTE — Telephone Encounter (Signed)
Spoke with pt's daughter today. Daughter stated that during the OV, 07/06/21, you stated that you have noticed pt is starting to loose his memory as well. Daughter wants to know if you can prescribe pt something that can help him retain his memory? ? ?Please advice.  ?

## 2021-07-16 ENCOUNTER — Other Ambulatory Visit: Payer: Self-pay | Admitting: Family Medicine

## 2021-07-16 MED ORDER — DONEPEZIL HCL 5 MG PO TABS: 5.0000 mg | ORAL_TABLET | Freq: Every day | ORAL | 2 refills | Status: DC

## 2021-07-16 NOTE — Telephone Encounter (Signed)
Spoke with daughter/Kim, aware of new Rx for pt. Voiced understanding.  ?

## 2021-07-17 NOTE — Progress Notes (Signed)
Remote pacemaker transmission.   

## 2021-08-12 ENCOUNTER — Other Ambulatory Visit: Payer: Self-pay | Admitting: Family Medicine

## 2021-08-13 ENCOUNTER — Telehealth: Payer: Self-pay | Admitting: Pharmacist

## 2021-08-13 NOTE — Telephone Encounter (Signed)
Requested Prescriptions  Pending Prescriptions Disp Refills  . furosemide (LASIX) 20 MG tablet [Pharmacy Med Name: FUROSEMIDE '20MG'$  TABLETS] 45 tablet 2    Sig: TAKE 1/2 TABLET(10 MG) BY MOUTH DAILY     Cardiovascular:  Diuretics - Loop Failed - 08/12/2021  6:36 AM      Failed - Mg Level in normal range and within 180 days    Magnesium  Date Value Ref Range Status  07/10/2020 2.0 1.7 - 2.4 mg/dL Final    Comment:    Performed at [facility], [place] [address] [place], [place] [address]         Failed - Last BP in normal range    BP Readings from Last 1 Encounters:  05/08/21 (!) 152/72         Passed - K in normal range and within 180 days    Potassium  Date Value Ref Range Status  05/08/2021 4.0 3.5 - 5.3 mmol/L Final         Passed - Ca in normal range and within 180 days    Calcium  Date Value Ref Range Status  05/08/2021 9.3 8.6 - 10.3 mg/dL Final   Calcium, Ion  Date Value Ref Range Status  09/18/2019 1.08 (L) 1.15 - 1.40 mmol/L Final         Passed - Na in normal range and within 180 days    Sodium  Date Value Ref Range Status  05/08/2021 139 135 - 146 mmol/L Final  07/29/2020 134 134 - 144 mmol/L Final         Passed - Cr in normal range and within 180 days    Creat  Date Value Ref Range Status  05/08/2021 1.19 0.70 - 1.22 mg/dL Final         Passed - Cl in normal range and within 180 days    Chloride  Date Value Ref Range Status  05/08/2021 102 98 - 110 mmol/L Final         Passed - Valid encounter within last 6 months    Recent Outpatient Visits          3 months ago Essential hypertension   [place], Warren T, MD   7 months ago Hematuria, unspecified type   [place] [name], MD   11 months ago Paroxysmal atrial fibrillation [facility])   [place] [name], MD   12 months ago Paroxysmal atrial fibrillation [facility])   [place] Pickard, Cammie Mcgee,  MD   1 year ago Herpes zoster without complication   [place] Eulogio Bear, NP

## 2021-08-13 NOTE — Progress Notes (Signed)
Chronic Care Management Pharmacy Assistant   Name: [name]  MRN: [id number] DOB: 05/06/1933   Reason for Encounter: Hypertension Adherence Call    Recent office visits:  05/08/2021 OV (PCP) [name], MD;  I believe the reason his blood pressure is high is because he has not been taking losartan.  I have asked him to resume the losartan 50 mg a day and recheck blood pressure here in 1 week.  If persistently high at that point I will increase losartan to 100 mg a day.   Recent consult visits:  None  Hospital visits:  None in previous 6 months  Medications: Outpatient Encounter Medications as of 08/13/2021  Medication Sig   acetaminophen (TYLENOL) 325 MG tablet Take 1-2 tablets (325-650 mg total) by mouth every 4 (four) hours as needed for mild pain.   albuterol (VENTOLIN HFA) 108 (90 Base) MCG/ACT inhaler Inhale 2 puffs into the lungs every 4 (four) hours as needed for wheezing or shortness of breath.   atorvastatin (LIPITOR) 80 MG tablet TAKE 1 TABLET BY MOUTH EVERY DAY AT 6 PM   carbamazepine (CARBATROL) 300 MG 12 hr capsule TAKE 1 CAPSULE(300 MG) BY MOUTH TWICE DAILY   cloNIDine (CATAPRES) 0.2 MG tablet TAKE 1 TABLET(0.2 MG) BY MOUTH TWICE DAILY   donepezil (ARICEPT) 5 MG tablet Take 1 tablet (5 mg total) by mouth at bedtime.   escitalopram (LEXAPRO) 5 MG tablet TAKE 1 TABLET BY MOUTH EVERY DAY AT BEDTIME   furosemide (LASIX) 20 MG tablet TAKE 1/2 TABLET(10 MG) BY MOUTH DAILY   glucose blood test strip 1 each by Other route as needed. Use as instructed   glucose monitoring kit (FREESTYLE) monitoring kit 1 each by Does not apply route as needed.   HYDROcodone-acetaminophen (NORCO) 5-325 MG tablet Take 1 tablet by mouth every 6 (six) hours as needed for moderate pain. (Patient not taking: Reported on 03/06/2021)   isosorbide mononitrate (IMDUR) 60 MG 24 hr tablet Take 0.5 tablets (30 mg total) by mouth daily.   Lancets (ACCU-CHEK SAFE-T PRO) lancets 1 each by Other  route as needed. Use as instructed   levETIRAcetam (KEPPRA) 250 MG tablet TAKE 1 TABLET(250 MG) BY MOUTH TWICE DAILY   levothyroxine (SYNTHROID) 88 MCG tablet Take 1 tablet (88 mcg total) by mouth daily.   losartan (COZAAR) 100 MG tablet TAKE 1/2 TABLET BY MOUTH DAILY   Multiple Vitamins-Minerals (MULTIVITAMINS THER. W/MINERALS) TABS Take 1 tablet by mouth at bedtime.     MYRBETRIQ 50 MG TB24 tablet Take 50 mg by mouth daily.   rivaroxaban (XARELTO) 20 MG TABS tablet Take 1 tablet (20 mg total) by mouth daily with supper.   tamsulosin (FLOMAX) 0.4 MG CAPS capsule TAKE 1 CAPSULE(0.4 MG) BY MOUTH DAILY AFTER AND SUPPER   No facility-administered encounter medications on file as of 08/13/2021.   Reviewed chart prior to disease state call. Spoke with patient regarding BP  Recent Office Vitals: BP Readings from Last 3 Encounters:  05/08/21 (!) 152/72  01/08/21 (!) 180/78  12/10/20 138/70   Pulse Readings from Last 3 Encounters:  05/08/21 73  01/08/21 (!) 54  12/10/20 60    Wt Readings from Last 3 Encounters:  05/08/21 180 lb (81.6 kg)  03/06/21 182 lb (82.6 kg)  01/08/21 182 lb (82.6 kg)     Kidney Function Lab Results  Component Value Date/Time   CREATININE 1.19 05/08/2021 11:12 AM   CREATININE 0.93 08/29/2020 07:37 AM   CREATININE 0.92 07/29/2020  12:06 PM   CREATININE 1.04 07/15/2020 03:32 PM   GFRNONAA >60 08/29/2020 07:37 AM   GFRNONAA 65 07/15/2020 03:32 PM   GFRAA 76 07/15/2020 03:32 PM       Latest Ref Rng & Units 05/08/2021   11:12 AM 08/29/2020    7:37 AM 07/29/2020   12:06 PM  BMP  Glucose 65 - 99 mg/dL 82   120   110    BUN 7 - 25 mg/dL 24   19   16     Creatinine 0.70 - 1.22 mg/dL 1.19   0.93   0.92    BUN/Creat Ratio 6 - 22 (calc) NOT APPLICABLE    17    Sodium 135 - 146 mmol/L 139   134   134    Potassium 3.5 - 5.3 mmol/L 4.0   3.9   4.3    Chloride 98 - 110 mmol/L 102   100   97    CO2 20 - 32 mmol/L 26   26   25     Calcium 8.6 - 10.3 mg/dL 9.3   9.1   9.0       Current antihypertensive regimen:    How often are you checking your Blood Pressure?   Current home BP readings:   What recent interventions/DTPs have been made by any provider to improve Blood Pressure control since last CPP Visit:   Any recent hospitalizations or ED visits since last visit with CPP?   What diet changes have been made to improve Blood Pressure Control?    What exercise is being done to improve your Blood Pressure Control?    Adherence Review: Is the patient currently on ACE/ARB medication?  Does the patient have >5 day gap between last estimated fill dates?   **Unsuccessful attempt at reaching patient to complete this call.**   Care Gaps: Medicare Annual Wellness: Completed 03/06/2021 Ophthalmology Exam: Overdue since 09/27/2018 Foot Exam: Overdue since 03/19/2020 Hemoglobin A1C: 5.8% on 05/08/2021 Colonoscopy: Completed 12/19/2013  Future Appointments  Date Time Provider [place]  09/30/2021  7:05 AM [facility] DEVICE REMOTES [facility]  12/30/2021  7:05 AM [facility] DEVICE REMOTES [facility]  03/12/2022 12:00 PM [facility]-NURSE HEALTH ADVISOR [facility]   Star Rating Drugs: Atorvastatin 80 mg last filled 07/22/2021 90 DS Losartan 100 mg last filled 07/06/2021 90 DS  April D Calhoun, [facility] Pharmacist Assistant [phone]

## 2021-08-27 ENCOUNTER — Other Ambulatory Visit: Payer: Self-pay | Admitting: Family Medicine

## 2021-09-04 DIAGNOSIS — M1711 Unilateral primary osteoarthritis, right knee: Secondary | ICD-10-CM | POA: Insufficient documentation

## 2021-09-09 DIAGNOSIS — M1711 Unilateral primary osteoarthritis, right knee: Secondary | ICD-10-CM | POA: Diagnosis not present

## 2021-09-15 ENCOUNTER — Telehealth: Payer: Self-pay | Admitting: Pharmacist

## 2021-09-15 NOTE — Progress Notes (Signed)
Chronic Care Management Pharmacy Assistant   Name: [name]  MRN: [id number] DOB: 1933/09/23   Reason for Encounter: Hypertension Adherence Call    Recent office visits:  None since last adherence call  Recent consult visits:  None since last adherence call  Hospital visits:  None in previous 6 months  Medications: Outpatient Encounter Medications as of 09/15/2021  Medication Sig   acetaminophen (TYLENOL) 325 MG tablet Take 1-2 tablets (325-650 mg total) by mouth every 4 (four) hours as needed for mild pain.   albuterol (VENTOLIN HFA) 108 (90 Base) MCG/ACT inhaler Inhale 2 puffs into the lungs every 4 (four) hours as needed for wheezing or shortness of breath.   atorvastatin (LIPITOR) 80 MG tablet TAKE 1 TABLET BY MOUTH EVERY DAY AT 6 PM   carbamazepine (CARBATROL) 300 MG 12 hr capsule TAKE 1 CAPSULE(300 MG) BY MOUTH TWICE DAILY   cloNIDine (CATAPRES) 0.2 MG tablet TAKE 1 TABLET(0.2 MG) BY MOUTH TWICE DAILY   donepezil (ARICEPT) 5 MG tablet Take 1 tablet (5 mg total) by mouth at bedtime.   escitalopram (LEXAPRO) 5 MG tablet TAKE 1 TABLET BY MOUTH EVERY DAY AT BEDTIME   furosemide (LASIX) 20 MG tablet TAKE 1/2 TABLET(10 MG) BY MOUTH DAILY   glucose blood test strip 1 each by Other route as needed. Use as instructed   glucose monitoring kit (FREESTYLE) monitoring kit 1 each by Does not apply route as needed.   HYDROcodone-acetaminophen (NORCO) 5-325 MG tablet Take 1 tablet by mouth every 6 (six) hours as needed for moderate pain. (Patient not taking: Reported on 03/06/2021)   isosorbide mononitrate (IMDUR) 60 MG 24 hr tablet Take 0.5 tablets (30 mg total) by mouth daily.   Lancets (ACCU-CHEK SAFE-T PRO) lancets 1 each by Other route as needed. Use as instructed   levETIRAcetam (KEPPRA) 250 MG tablet TAKE 1 TABLET(250 MG) BY MOUTH TWICE DAILY   levothyroxine (SYNTHROID) 88 MCG tablet Take 1 tablet (88 mcg total) by mouth daily.   losartan (COZAAR) 100 MG tablet TAKE 1/2  TABLET BY MOUTH DAILY   Multiple Vitamins-Minerals (MULTIVITAMINS THER. W/MINERALS) TABS Take 1 tablet by mouth at bedtime.     MYRBETRIQ 50 MG TB24 tablet Take 50 mg by mouth daily.   rivaroxaban (XARELTO) 20 MG TABS tablet Take 1 tablet (20 mg total) by mouth daily with supper.   tamsulosin (FLOMAX) 0.4 MG CAPS capsule TAKE 1 CAPSULE(0.4 MG) BY MOUTH DAILY AFTER AND SUPPER   No facility-administered encounter medications on file as of 09/15/2021.   Reviewed chart prior to disease state call. Spoke with patient regarding BP  Recent Office Vitals: BP Readings from Last 3 Encounters:  05/08/21 (!) 152/72  01/08/21 (!) 180/78  12/10/20 138/70   Pulse Readings from Last 3 Encounters:  05/08/21 73  01/08/21 (!) 54  12/10/20 60    Wt Readings from Last 3 Encounters:  05/08/21 180 lb (81.6 kg)  03/06/21 182 lb (82.6 kg)  01/08/21 182 lb (82.6 kg)     Kidney Function Lab Results  Component Value Date/Time   CREATININE 1.19 05/08/2021 11:12 AM   CREATININE 0.93 08/29/2020 07:37 AM   CREATININE 0.92 07/29/2020 12:06 PM   CREATININE 1.04 07/15/2020 03:32 PM   GFRNONAA >60 08/29/2020 07:37 AM   GFRNONAA 65 07/15/2020 03:32 PM   GFRAA 76 07/15/2020 03:32 PM       Latest Ref Rng & Units 05/08/2021   11:12 AM 08/29/2020    7:37 AM 07/29/2020  12:06 PM  BMP  Glucose 65 - 99 mg/dL 82  027  253   BUN 7 - 25 mg/dL 24  19  16    Creatinine 0.70 - 1.22 mg/dL 6.64  4.03  4.74   BUN/Creat Ratio 6 - 22 (calc) NOT APPLICABLE   17   Sodium 135 - 146 mmol/L 139  134  134   Potassium 3.5 - 5.3 mmol/L 4.0  3.9  4.3   Chloride 98 - 110 mmol/L 102  100  97   CO2 20 - 32 mmol/L 26  26  25    Calcium 8.6 - 10.3 mg/dL 9.3  9.1  9.0     Current antihypertensive regimen:  Clonidine 0.2 mg twice daily Furosemide 20 mg daily Losartan 100 mg 1/2 tablet daily Isosorbide 60 mg 1/2 tablet daily  How often are you checking your Blood Pressure? 3-5x per week  Current home BP readings: 120/71,  160/60-70  What recent interventions/DTPs have been made by any provider to improve Blood Pressure control since last CPP Visit: No recent interventions or DTPs.  Any recent hospitalizations or ED visits since last visit with CPP? No  What diet changes have been made to improve Blood Pressure Control?  No recent diet changes.  What exercise is being done to improve your Blood Pressure Control?  Patient is not currently exercising.  Adherence Review: Is the patient currently on ACE/ARB medication? Yes Does the patient have >5 day gap between last estimated fill dates? No  Care Gaps: Medicare Annual Wellness: Completed 03/06/2021 Ophthalmology Exam: Overdue since 09/27/2018 Foot Exam: Overdue since 03/19/2020 Hemoglobin A1C: 5.8% on 05/08/2021 Colonoscopy: Completed 12/19/2013  Future Appointments  Date Time Provider Department Center  09/18/2021  9:40 AM Rollene Rotunda, MD [facility]  09/30/2021  7:05 AM [facility] DEVICE REMOTES [facility]  12/30/2021  7:05 AM [facility] DEVICE REMOTES [facility]  03/12/2022 12:00 PM [facility]-NURSE HEALTH ADVISOR [facility]   Star Rating Drugs: Atorvastatin 80 mg last filled 07/22/2021 90 DS Losartan 100 mg last filled 07/06/2021 90 DS  April D Calhoun, [facility] Clinical Pharmacist Assistant [phone]

## 2021-09-16 ENCOUNTER — Telehealth: Payer: Self-pay

## 2021-09-16 NOTE — Telephone Encounter (Signed)
FYI Spoke with pt. Per pt stated that he feels that his b/p is running high/low. He thinks that it's probably his diet. However, per pt feels ok, and have no symptoms of any kind.   Per pt his readings are  6/22 at 620am 157/62 6/24 624am 132/72  Pt stated that his bp machine is broken/not working so he have not been able to check his bp. Told pt that for him to go into [facility] sometime tom and check his bp and maybe grab a new bp machine or replace batteries.   Per pt has an appt w/cardiologist on Friday and well let us know what his b/p readings are. Also, advice pt that if he starts to have any symptoms that he should call EMS and seek medical assistance.   Pt has an OV appt on 7/10 at 2pm

## 2021-09-17 NOTE — Progress Notes (Signed)
Cardiology Office Note   Date:  09/18/2021   ID:  [name], DOB [birth year], MRN [id number]  PCP:  [name], MD  Cardiologist:   Minus Breeding, MD   Chief Complaint  Patient presents with   Coronary Artery Disease      History of Present Illness: [name] is a 88 y.o. male who presents for follow up of CAD and CVA following a cath in 2014.  He has also had atrial flutter/fibrillation.   He was admitted in early April 2022 with he had lower extremity weakness.  MRI demonstrated spinal stenosis.  He was thought to be dehydrated.  He was admitted a couple of days later with syncope.  He subsequently had a pacemaker for CHB.    Since he was last seen he has done well.  He has fallen a couple times when he has not been using his walker. The patient denies any new symptoms such as chest discomfort, neck or arm discomfort. There has been no new shortness of breath, PND or orthopnea. There have been no reported palpitations, presyncope or syncope.  Because of the falls he is having some knee pain which is his biggest limitation.  Past Medical History:  Diagnosis Date   Atrial flutter ([place])    BPH (benign prostatic hyperplasia)    Coronary artery disease    a. s/p MI tx with Cypher DES to pRCA in 4/04;  b. Echocardiogram 7/09: Normal LV function.  c. Nuclear study 3/13 no ischemia;  d. ETT 2/14 neg;  e. admx with CP => LHC (01/30/2013):  pLAD 40-50%, oD1 70-80%, oOM1 40%, pRCA stent patent, mid RCA 30%, EF 60-65%. => med Rx.   DDD (degenerative disc disease)    Diabetes mellitus without complication ([place])    Dyslipidemia    Gait disorder 07/13/2018   Hemorrhoids    Hx MRSA infection    left buttocks abscess   Hx of echocardiogram    a. Echocardiogram (01/31/2013): Mild focal basal and mild concentric hypertrophy of the septum, EF 50-55%, normal wall motion, grade 1 diastolic dysfunction, trivial AI, MAC, mild LAE, PASP 35   Hyperlipidemia    Hypertension     Hypothyroidism    Meniere's disease    Status post shunt   Myocardial infarction [facility]) 2008   Occlusion and stenosis of carotid artery without mention of cerebral infarction    40-59% on carotid doppler 2014; [place] (01/2013): R 1-39%, L 60-79%   Rotator cuff injury    chronic rotator cuff injury status post repair   Seizure disorder ([place])    Stroke ([place])    a. 01/2013=> post cardiac cath CVA to L post communicating artery system; R sided weakness   Syncope     Past Surgical History:  Procedure Laterality Date   COLONOSCOPY     CORONARY ANGIOPLASTY WITH STENT PLACEMENT  07/14/2002   Stent to the right coronary artery   ENDOLYMPHATIC SHUNT DECOMPRESSION  06/11/2009    Right endolymphatic sac decompression and shunt  placement   HERNIA REPAIR  04/10/2008   scrotal hernia repair   LEFT HEART CATHETERIZATION WITH CORONARY ANGIOGRAM N/A 01/30/2013   Procedure: LEFT HEART CATHETERIZATION WITH CORONARY ANGIOGRAM;  Surgeon: Larey Dresser, MD;  Location: [facility] CATH LAB;  Service: Cardiovascular;  Laterality: N/A;   PACEMAKER IMPLANT N/A 08/29/2020   Procedure: PACEMAKER IMPLANT;  Surgeon: [name], MD;  Location: [facility] CV LAB;  Service: Cardiovascular;  Laterality: N/A;  ROTATOR CUFF REPAIR     for chronic rotator cuff injury     Current Outpatient Medications  Medication Sig Dispense Refill   acetaminophen (TYLENOL) 325 MG tablet Take 1-2 tablets (325-650 mg total) by mouth every 4 (four) hours as needed for mild pain.     albuterol (VENTOLIN HFA) 108 (90 Base) MCG/ACT inhaler Inhale 2 puffs into the lungs every 4 (four) hours as needed for wheezing or shortness of breath. 18 g 11   atorvastatin (LIPITOR) 80 MG tablet TAKE 1 TABLET BY MOUTH EVERY DAY AT 6 PM 90 tablet 3   carbamazepine (CARBATROL) 300 MG 12 hr capsule TAKE 1 CAPSULE(300 MG) BY MOUTH TWICE DAILY 180 capsule 3   cloNIDine (CATAPRES) 0.2 MG tablet TAKE 1 TABLET(0.2 MG) BY MOUTH TWICE DAILY 180 tablet 3   donepezil  (ARICEPT) 5 MG tablet Take 1 tablet (5 mg total) by mouth at bedtime. 30 tablet 2   escitalopram (LEXAPRO) 5 MG tablet TAKE 1 TABLET BY MOUTH EVERY DAY AT BEDTIME 90 tablet 0   furosemide (LASIX) 20 MG tablet TAKE 1/2 TABLET(10 MG) BY MOUTH DAILY 45 tablet 0   glucose monitoring kit (FREESTYLE) monitoring kit 1 each by Does not apply route as needed. 1 each 0   HYDROcodone-acetaminophen (NORCO) 5-325 MG tablet Take 1 tablet by mouth every 6 (six) hours as needed for moderate pain. 15 tablet 0   isosorbide mononitrate (IMDUR) 60 MG 24 hr tablet Take 0.5 tablets (30 mg total) by mouth daily. 90 tablet 3   Lancets (ACCU-CHEK SAFE-T PRO) lancets 1 each by Other route as needed. Use as instructed 100 each 3   levETIRAcetam (KEPPRA) 250 MG tablet TAKE 1 TABLET(250 MG) BY MOUTH TWICE DAILY 180 tablet 1   levothyroxine (SYNTHROID) 88 MCG tablet Take 1 tablet (88 mcg total) by mouth daily. 90 tablet 3   losartan (COZAAR) 100 MG tablet TAKE 1/2 TABLET BY MOUTH DAILY 90 tablet 1   Multiple Vitamins-Minerals (MULTIVITAMINS THER. W/MINERALS) TABS Take 1 tablet by mouth at bedtime.       MYRBETRIQ 50 MG TB24 tablet Take 50 mg by mouth daily.     tamsulosin (FLOMAX) 0.4 MG CAPS capsule TAKE 1 CAPSULE(0.4 MG) BY MOUTH DAILY AFTER AND SUPPER 90 capsule 3   glucose blood test strip 1 each by Other route as needed. Use as instructed (Patient not taking: Reported on 09/18/2021) 100 each 3   rivaroxaban (XARELTO) 20 MG TABS tablet Take 1 tablet (20 mg total) by mouth daily with supper. 90 tablet 3   No current facility-administered medications for this visit.    Allergies:   Penicillins     ROS:  Please see the history of present illness.   Otherwise, review of systems are positive for none.   All other systems are reviewed and negative.    PHYSICAL EXAM: VS:  BP (!) 127/56   Pulse 66   Ht 5' 8.5" (1.74 m)   Wt 176 lb (79.8 kg)   SpO2 96%   BMI 26.37 kg/m  , BMI Body mass index is 26.37 kg/m. GENERAL:   Well appearing NECK:  No jugular venous distention, waveform within normal limits, carotid upstroke brisk and symmetric, no bruits, no thyromegaly LUNGS:  Clear to auscultation bilaterally CHEST:  Unremarkable HEART:  PMI not displaced or sustained,S1 and S2 within normal limits, no S3, no S4, no clicks, no rubs, 3 out of 6 apical systolic murmur slightly radiating at the aortic outflow tract, no diastolic  murmurs ABD:  Flat, positive bowel sounds normal in frequency in pitch, no bruits, no rebound, no guarding, no midline pulsatile mass, no hepatomegaly, no splenomegaly EXT:  2 plus pulses throughout, no edema, no cyanosis no clubbing  EKG:  EKG is not ordered today.    Recent Labs: 05/08/2021: ALT 13; BUN 24; Creat 1.19; Hemoglobin 11.9; Platelets 234; Potassium 4.0; Sodium 139; TSH 8.39    Lipid Panel    Component Value Date/Time   CHOL 193 05/08/2021 1112   TRIG 75 05/08/2021 1112   HDL 78 05/08/2021 1112   CHOLHDL 2.5 05/08/2021 1112   VLDL 26 05/30/2018 0440   LDLCALC 99 05/08/2021 1112      Wt Readings from Last 3 Encounters:  09/18/21 176 lb (79.8 kg)  05/08/21 180 lb (81.6 kg)  03/06/21 182 lb (82.6 kg)      Other studies Reviewed: Additional studies/ records that were reviewed today include: Labs Review of the above records demonstrates:  Please see elsewhere in the note.     ASSESSMENT AND PLAN:  Atrial flutter with bradycardia:   He tolerates anticoagulation.    No change in therapy.  He is tolerating Xarelto.  Status post pacemaker placement.  He will follow-up with EP.  Hypertension:   Blood pressure is slightly elevated.  He is going to keep a blood pressure diary.  I looked back at previous readings and it is usually not this elevated.   Chronic diastolic heart failure: He seems to be euvolemic.  No change in therapy.  CAD:   He is having no chest pain.  He is continuing with risk reduction.   Hypothyroidism:   TSH was within normal limits earlier  this year.  No change in therapy.   Hyperlipidemia:    LDL was 99 with an HDL of 78.  He will continue the meds as listed.   Bilateral carotid artery disease:   He has had mild plaque in the past.  No further imaging at this point.  Current medicines are reviewed at length with the patient today.  The patient does not have concerns regarding medicines.  The following changes have been made: None  Labs/ tests ordered today include: None  No orders of the defined types were placed in this encounter.    Disposition:   FU with me in 1 year   Signed, Minus Breeding, MD  09/18/2021 10:11 AM    [place]

## 2021-09-18 ENCOUNTER — Ambulatory Visit: Payer: Medicare Other | Admitting: Cardiology

## 2021-09-18 ENCOUNTER — Encounter: Payer: Self-pay | Admitting: Cardiology

## 2021-09-18 VITALS — BP 127/56 | HR 66 | Ht 68.5 in | Wt 176.0 lb

## 2021-09-18 DIAGNOSIS — I2510 Atherosclerotic heart disease of native coronary artery without angina pectoris: Secondary | ICD-10-CM

## 2021-09-18 DIAGNOSIS — I5032 Chronic diastolic (congestive) heart failure: Secondary | ICD-10-CM

## 2021-09-18 DIAGNOSIS — I779 Disorder of arteries and arterioles, unspecified: Secondary | ICD-10-CM

## 2021-09-18 DIAGNOSIS — I4892 Unspecified atrial flutter: Secondary | ICD-10-CM

## 2021-09-18 DIAGNOSIS — I10 Essential (primary) hypertension: Secondary | ICD-10-CM | POA: Diagnosis not present

## 2021-09-18 DIAGNOSIS — E785 Hyperlipidemia, unspecified: Secondary | ICD-10-CM

## 2021-09-18 MED ORDER — RIVAROXABAN 20 MG PO TABS: 20.0000 mg | ORAL_TABLET | Freq: Every day | ORAL | 3 refills | Status: DC

## 2021-09-18 NOTE — Patient Instructions (Signed)
Medication Instructions:  Your physician recommends that you continue on your current medications as directed. Please refer to the Current Medication list given to you today.  *If you need a refill on your cardiac medications before your next appointment, please call your pharmacy*   Lab Work: None If you have labs (blood work) drawn today and your tests are completely normal, you will receive your results only by: [place] (if you have MyChart) OR A paper copy in the mail If you have any lab test that is abnormal or we need to change your treatment, we will call you to review the results.   Testing/Procedures: None   Follow-Up: At [facility], you and your health needs are our priority.  As part of our continuing mission to provide you with exceptional heart care, we have created designated Provider Care Teams.  These Care Teams include your primary Cardiologist (physician) and Advanced Practice Providers (APPs -  Physician Assistants and Nurse Practitioners) who all work together to provide you with the care you need, when you need it.  We recommend signing up for the patient portal called "MyChart".  Sign up information is provided on this After Visit Summary.  MyChart is used to connect with patients for Virtual Visits (Telemedicine).  Patients are able to view lab/test results, encounter notes, upcoming appointments, etc.  Non-urgent messages can be sent to your provider as well.   To learn more about what you can do with MyChart, go to [email or web address].    Your next appointment:   1 year(s)  The format for your next appointment:   In Person  Provider:   Minus Breeding, MD     Other Instructions   Important Information About Sugar

## 2021-09-18 NOTE — Telephone Encounter (Signed)
Spoke with pt per cardiology it was good. Per pt  aware and will get a working bp machine and keep checking his bp

## 2021-09-26 ENCOUNTER — Observation Stay (HOSPITAL_BASED_OUTPATIENT_CLINIC_OR_DEPARTMENT_OTHER)
Admission: EM | Admit: 2021-09-26 | Discharge: 2021-09-29 | Disposition: A | Discharge status: Home Health | Payer: Medicare Other | Attending: Family Medicine | Admitting: Family Medicine

## 2021-09-26 DIAGNOSIS — G40909 Epilepsy, unspecified, not intractable, without status epilepticus: Secondary | ICD-10-CM

## 2021-09-26 DIAGNOSIS — E119 Type 2 diabetes mellitus without complications: Secondary | ICD-10-CM | POA: Diagnosis not present

## 2021-09-26 DIAGNOSIS — Z950 Presence of cardiac pacemaker: Secondary | ICD-10-CM | POA: Diagnosis not present

## 2021-09-26 DIAGNOSIS — I2510 Atherosclerotic heart disease of native coronary artery without angina pectoris: Secondary | ICD-10-CM | POA: Diagnosis not present

## 2021-09-26 DIAGNOSIS — R200 Anesthesia of skin: Secondary | ICD-10-CM | POA: Diagnosis not present

## 2021-09-26 DIAGNOSIS — I6381 Other cerebral infarction due to occlusion or stenosis of small artery: Secondary | ICD-10-CM | POA: Diagnosis not present

## 2021-09-26 DIAGNOSIS — I110 Hypertensive heart disease with heart failure: Secondary | ICD-10-CM | POA: Diagnosis not present

## 2021-09-26 DIAGNOSIS — Z95820 Peripheral vascular angioplasty status with implants and grafts: Secondary | ICD-10-CM | POA: Diagnosis not present

## 2021-09-26 DIAGNOSIS — I10 Essential (primary) hypertension: Secondary | ICD-10-CM | POA: Diagnosis present

## 2021-09-26 DIAGNOSIS — Z87891 Personal history of nicotine dependence: Secondary | ICD-10-CM | POA: Insufficient documentation

## 2021-09-26 DIAGNOSIS — R2990 Unspecified symptoms and signs involving the nervous system: Secondary | ICD-10-CM

## 2021-09-26 DIAGNOSIS — R9431 Abnormal electrocardiogram [ECG] [EKG]: Secondary | ICD-10-CM | POA: Diagnosis not present

## 2021-09-26 DIAGNOSIS — E785 Hyperlipidemia, unspecified: Secondary | ICD-10-CM | POA: Diagnosis present

## 2021-09-26 DIAGNOSIS — Z9889 Other specified postprocedural states: Secondary | ICD-10-CM | POA: Diagnosis not present

## 2021-09-26 DIAGNOSIS — E039 Hypothyroidism, unspecified: Secondary | ICD-10-CM | POA: Diagnosis not present

## 2021-09-26 DIAGNOSIS — Z79899 Other long term (current) drug therapy: Secondary | ICD-10-CM | POA: Diagnosis not present

## 2021-09-26 DIAGNOSIS — I6782 Cerebral ischemia: Secondary | ICD-10-CM | POA: Diagnosis not present

## 2021-09-26 DIAGNOSIS — Z955 Presence of coronary angioplasty implant and graft: Secondary | ICD-10-CM | POA: Insufficient documentation

## 2021-09-26 DIAGNOSIS — I5032 Chronic diastolic (congestive) heart failure: Secondary | ICD-10-CM | POA: Diagnosis not present

## 2021-09-26 DIAGNOSIS — E118 Type 2 diabetes mellitus with unspecified complications: Secondary | ICD-10-CM | POA: Diagnosis present

## 2021-09-26 DIAGNOSIS — G459 Transient cerebral ischemic attack, unspecified: Secondary | ICD-10-CM | POA: Diagnosis not present

## 2021-09-26 DIAGNOSIS — I639 Cerebral infarction, unspecified: Secondary | ICD-10-CM | POA: Diagnosis not present

## 2021-09-26 DIAGNOSIS — Z8673 Personal history of transient ischemic attack (TIA), and cerebral infarction without residual deficits: Secondary | ICD-10-CM

## 2021-09-26 DIAGNOSIS — Z7901 Long term (current) use of anticoagulants: Secondary | ICD-10-CM | POA: Insufficient documentation

## 2021-09-26 DIAGNOSIS — I4892 Unspecified atrial flutter: Secondary | ICD-10-CM | POA: Diagnosis present

## 2021-09-26 NOTE — ED Triage Notes (Signed)
Pt reports elevated BP x2 days. Pt reports being "more unsteady" over last few days. C/o right shoulder, right hand numbness today. Denies CP, denies ShOB, denies HA denies blurred vision.   Hx of stroke, takes xarelto. Pt compliant with medications.

## 2021-09-27 ENCOUNTER — Other Ambulatory Visit: Payer: Self-pay

## 2021-09-27 ENCOUNTER — Observation Stay (HOSPITAL_COMMUNITY): Payer: Medicare Other

## 2021-09-27 ENCOUNTER — Emergency Department (HOSPITAL_BASED_OUTPATIENT_CLINIC_OR_DEPARTMENT_OTHER): Payer: Medicare Other

## 2021-09-27 ENCOUNTER — Encounter (HOSPITAL_BASED_OUTPATIENT_CLINIC_OR_DEPARTMENT_OTHER): Payer: Self-pay | Admitting: Internal Medicine

## 2021-09-27 DIAGNOSIS — E119 Type 2 diabetes mellitus without complications: Secondary | ICD-10-CM | POA: Diagnosis not present

## 2021-09-27 DIAGNOSIS — Z87891 Personal history of nicotine dependence: Secondary | ICD-10-CM | POA: Diagnosis not present

## 2021-09-27 DIAGNOSIS — I2510 Atherosclerotic heart disease of native coronary artery without angina pectoris: Secondary | ICD-10-CM | POA: Diagnosis not present

## 2021-09-27 DIAGNOSIS — Z95820 Peripheral vascular angioplasty status with implants and grafts: Secondary | ICD-10-CM | POA: Diagnosis not present

## 2021-09-27 DIAGNOSIS — I4892 Unspecified atrial flutter: Secondary | ICD-10-CM | POA: Diagnosis not present

## 2021-09-27 DIAGNOSIS — Z7901 Long term (current) use of anticoagulants: Secondary | ICD-10-CM | POA: Diagnosis not present

## 2021-09-27 DIAGNOSIS — I6381 Other cerebral infarction due to occlusion or stenosis of small artery: Secondary | ICD-10-CM | POA: Diagnosis not present

## 2021-09-27 DIAGNOSIS — Z955 Presence of coronary angioplasty implant and graft: Secondary | ICD-10-CM | POA: Diagnosis not present

## 2021-09-27 DIAGNOSIS — R200 Anesthesia of skin: Secondary | ICD-10-CM | POA: Diagnosis not present

## 2021-09-27 DIAGNOSIS — Z950 Presence of cardiac pacemaker: Secondary | ICD-10-CM | POA: Diagnosis not present

## 2021-09-27 DIAGNOSIS — Z79899 Other long term (current) drug therapy: Secondary | ICD-10-CM | POA: Diagnosis not present

## 2021-09-27 DIAGNOSIS — I5032 Chronic diastolic (congestive) heart failure: Secondary | ICD-10-CM | POA: Diagnosis not present

## 2021-09-27 DIAGNOSIS — Z8673 Personal history of transient ischemic attack (TIA), and cerebral infarction without residual deficits: Secondary | ICD-10-CM | POA: Diagnosis not present

## 2021-09-27 DIAGNOSIS — I10 Essential (primary) hypertension: Secondary | ICD-10-CM | POA: Diagnosis not present

## 2021-09-27 DIAGNOSIS — E039 Hypothyroidism, unspecified: Secondary | ICD-10-CM | POA: Diagnosis not present

## 2021-09-27 DIAGNOSIS — I110 Hypertensive heart disease with heart failure: Secondary | ICD-10-CM | POA: Diagnosis not present

## 2021-09-27 LAB — COMPREHENSIVE METABOLIC PANEL
ALT: 15 U/L (ref 0–44)
AST: 20 U/L (ref 15–41)
Albumin: 4.5 g/dL (ref 3.5–5.0)
Alkaline Phosphatase: 77 U/L (ref 38–126)
Anion gap: 10 (ref 5–15)
BUN: 28 mg/dL — ABNORMAL HIGH (ref 8–23)
CO2: 26 mmol/L (ref 22–32)
Calcium: 9.4 mg/dL (ref 8.9–10.3)
Chloride: 101 mmol/L (ref 98–111)
Creatinine, Ser: 1.14 mg/dL (ref 0.61–1.24)
GFR, Estimated: >60 mL/min (ref >60)
Glucose, Bld: 90 mg/dL (ref 70–99)
Potassium: 4.4 mmol/L (ref 3.5–5.1)
Sodium: 137 mmol/L (ref 135–145)
Total Bilirubin: 0.5 mg/dL (ref 0.3–1.2)
Total Protein: 6.4 g/dL — ABNORMAL LOW (ref 6.5–8.1)

## 2021-09-27 LAB — CBC WITH DIFFERENTIAL/PLATELET
Abs Immature Granulocytes: 0.02 10*3/uL (ref 0.00–0.07)
Basophils Absolute: 0.1 10*3/uL (ref 0.0–0.1)
Basophils Relative: 1 %
Eosinophils Absolute: 0.6 10*3/uL — ABNORMAL HIGH (ref 0.0–0.5)
Eosinophils Relative: 8 %
HCT: 35.4 % — ABNORMAL LOW (ref 39.0–52.0)
Hemoglobin: 11.8 g/dL — ABNORMAL LOW (ref 13.0–17.0)
Immature Granulocytes: 0 %
Lymphocytes Relative: 29 %
Lymphs Abs: 2.2 10*3/uL (ref 0.7–4.0)
MCH: 31.6 pg (ref 26.0–34.0)
MCHC: 33.3 g/dL (ref 30.0–36.0)
MCV: 94.7 fL (ref 80.0–100.0)
Monocytes Absolute: 0.9 10*3/uL (ref 0.1–1.0)
Monocytes Relative: 12 %
Neutro Abs: 3.8 10*3/uL (ref 1.7–7.7)
Neutrophils Relative %: 50 %
Platelets: 244 10*3/uL (ref 150–400)
RBC: 3.74 MIL/uL — ABNORMAL LOW (ref 4.22–5.81)
RDW: 15.1 % (ref 11.5–15.5)
WBC: 7.5 10*3/uL (ref 4.0–10.5)
nRBC: 0 % (ref 0.0–0.2)

## 2021-09-27 LAB — URINALYSIS, ROUTINE W REFLEX MICROSCOPIC
Bilirubin Urine: NEGATIVE
Glucose, UA: NEGATIVE mg/dL
Hgb urine dipstick: NEGATIVE
Ketones, ur: NEGATIVE mg/dL
Leukocytes,Ua: NEGATIVE
Nitrite: NEGATIVE
Protein, ur: NEGATIVE mg/dL
Specific Gravity, Urine: 1.007 (ref 1.005–1.030)
pH: 6.5 (ref 5.0–8.0)

## 2021-09-27 LAB — TROPONIN I (HIGH SENSITIVITY): Troponin I (High Sensitivity): 15 ng/L (ref <18)

## 2021-09-27 MED ORDER — STROKE: EARLY STAGES OF RECOVERY BOOK
Freq: Once | Status: AC
Start: 2021-09-28 — End: 2021-09-28
  Filled 2021-09-27: qty 1

## 2021-09-27 MED ORDER — RIVAROXABAN 20 MG PO TABS
20.0000 mg | ORAL_TABLET | Freq: Every day | ORAL | Status: DC
  Administered 2021-09-28 – 2021-09-29 (×2): 20 mg via ORAL
  Filled 2021-09-27 (×2): qty 1

## 2021-09-27 MED ORDER — SENNOSIDES-DOCUSATE SODIUM 8.6-50 MG PO TABS
1.0000 | ORAL_TABLET | Freq: Every evening | ORAL | Status: DC | PRN
Start: 2021-09-27 — End: 2021-09-29

## 2021-09-27 MED ORDER — ACETAMINOPHEN 650 MG RE SUPP: 650.0000 mg | RECTAL | Status: DC | PRN

## 2021-09-27 MED ORDER — DONEPEZIL HCL 10 MG PO TABS
5.0000 mg | ORAL_TABLET | Freq: Every day | ORAL | Status: DC
  Administered 2021-09-27 – 2021-09-28 (×2): 5 mg via ORAL
  Filled 2021-09-27 (×2): qty 1

## 2021-09-27 MED ORDER — ACETAMINOPHEN 160 MG/5ML PO SOLN: 650.0000 mg | ORAL | Status: DC | PRN

## 2021-09-27 MED ORDER — ISOSORBIDE MONONITRATE ER 30 MG PO TB24
30.0000 mg | ORAL_TABLET | Freq: Every day | ORAL | Status: DC
  Administered 2021-09-28 – 2021-09-29 (×2): 30 mg via ORAL
  Filled 2021-09-27 (×2): qty 1

## 2021-09-27 MED ORDER — LABETALOL HCL 5 MG/ML IV SOLN
10.0000 mg | INTRAVENOUS | Status: DC | PRN
Start: 2021-09-27 — End: 2021-09-29

## 2021-09-27 MED ORDER — ATORVASTATIN CALCIUM 80 MG PO TABS
80.0000 mg | ORAL_TABLET | Freq: Every day | ORAL | Status: DC
  Administered 2021-09-28 – 2021-09-29 (×2): 80 mg via ORAL
  Filled 2021-09-27 (×2): qty 1

## 2021-09-27 MED ORDER — CARBAMAZEPINE ER 100 MG PO TB12
300.0000 mg | ORAL_TABLET | Freq: Two times a day (BID) | ORAL | Status: DC
  Administered 2021-09-27 – 2021-09-29 (×4): 300 mg via ORAL
  Filled 2021-09-27 (×5): qty 3

## 2021-09-27 MED ORDER — LEVOTHYROXINE SODIUM 88 MCG PO TABS
88.0000 ug | ORAL_TABLET | Freq: Every day | ORAL | Status: DC
  Administered 2021-09-28 – 2021-09-29 (×2): 88 ug via ORAL
  Filled 2021-09-27 (×2): qty 1

## 2021-09-27 MED ORDER — ACETAMINOPHEN 325 MG PO TABS: 650.0000 mg | ORAL_TABLET | ORAL | Status: DC | PRN

## 2021-09-27 MED ORDER — LEVETIRACETAM 250 MG PO TABS
250.0000 mg | ORAL_TABLET | Freq: Two times a day (BID) | ORAL | Status: DC
  Administered 2021-09-27 – 2021-09-29 (×4): 250 mg via ORAL
  Filled 2021-09-27 (×4): qty 1

## 2021-09-27 NOTE — Assessment & Plan Note (Signed)
Continue atorvastatin

## 2021-09-27 NOTE — Assessment & Plan Note (Addendum)
Resume home antihypertensives

## 2021-09-27 NOTE — Assessment & Plan Note (Signed)
Continue carbamazepine and Keppra.

## 2021-09-27 NOTE — ED Notes (Signed)
Pt stated that he feels and Denis pain.

## 2021-09-27 NOTE — Assessment & Plan Note (Signed)
Continue Synthroid °

## 2021-09-27 NOTE — H&P (Signed)
History and Physical    [name] YWV:[id number] DOB: [birth year] DOA: 09/26/2021  PCP: Daniel Frizzle, MD  Patient coming from: Home via [place] ED  I have personally briefly reviewed patient's old medical records in [place]  Chief Complaint: Right-sided numbness, elevated blood pressure  HPI: [name] is a 88 y.o. male with medical history significant for paroxysmal atrial flutter on Xarelto, CAD s/p DES, CHB s/p PPM, history of CVA with mild residual right sided numbness, seizure disorder, T2DM, HTN, HLD, BPH who presented to the ED for evaluation of right-sided numbness and elevated blood pressures.  Patient states he has mild residual right-sided numbness involving his face, right arm, right leg since his prior stroke in 2009.  He has not had any residual weakness.  Over the last few days he has noticed that his blood pressure has been very high when checking it at home.  Highest was around 200/100 on 09/26/2021.  He says his SBP is usually in the 140s.  He reports adherence to his medications.  He has noticed slightly increasing numbness on his right side during this time.  He has been ambulating with a walker over the last year since injuring his right knee but also feels that his balance is a little worse since last few days as well.  He has not had any recent falls.  He denies any associated headache, change in vision, nausea, vomiting, palpitations, chest pain, dyspnea, abdominal pain, dysuria, peripheral edema.  [place] ED Course  Labs/Imaging on admission: I have personally reviewed following labs and imaging studies.  Initial vitals showed BP 212/83, pulse 63, RR 18, temp 98.3 F, SPO2 96% on room air.  Labs show WBC 7.5, hemoglobin 11.8, platelets 244,000, sodium 137, potassium 4.4, bicarb 26, BUN 28, creatinine 1.14, serum glucose 90, LFTs within normal limits, troponin 15.  Urinalysis negative for UTI.  Portable chest x-ray  negative for acute abnormality.  CT head without contrast shows old bilateral basal ganglia and thalamic lacunar infarcts.  Atrophy, chronic microvascular disease noted.  No acute intracranial abnormality.  The hospitalist service was consulted to admit for further evaluation and management.  Review of Systems: All systems reviewed and are negative except as documented in history of present illness above.   Past Medical History:  Diagnosis Date   Atrial flutter ([place])    BPH (benign prostatic hyperplasia)    Coronary artery disease    a. s/p MI tx with Cypher DES to pRCA in 4/04;  b. Echocardiogram 7/09: Normal LV function.  c. Nuclear study 3/13 no ischemia;  d. ETT 2/14 neg;  e. admx with CP => LHC (01/30/2013):  pLAD 40-50%, oD1 70-80%, oOM1 40%, pRCA stent patent, mid RCA 30%, EF 60-65%. => med Rx.   DDD (degenerative disc disease)    Diabetes mellitus without complication ([place])    Dyslipidemia    Gait disorder 07/13/2018   Hemorrhoids    Hx MRSA infection    left buttocks abscess   Hx of echocardiogram    a. Echocardiogram (01/31/2013): Mild focal basal and mild concentric hypertrophy of the septum, EF 50-55%, normal wall motion, grade 1 diastolic dysfunction, trivial AI, MAC, mild LAE, PASP 35   Hyperlipidemia    Hypertension    Hypothyroidism    Meniere's disease    Status post shunt   Myocardial infarction [facility]) 2008   Occlusion and stenosis of carotid artery without mention of cerebral infarction    40-59% on  carotid doppler 2014; [place] (01/2013): R 1-39%, L 60-79%   Rotator cuff injury    chronic rotator cuff injury status post repair   Seizure disorder ([place])    Stroke ([place])    a. 01/2013=> post cardiac cath CVA to L post communicating artery system; R sided weakness   Syncope     Past Surgical History:  Procedure Laterality Date   COLONOSCOPY     CORONARY ANGIOPLASTY WITH STENT PLACEMENT  07/14/2002   Stent to the right coronary artery   ENDOLYMPHATIC SHUNT  DECOMPRESSION  06/11/2009    Right endolymphatic sac decompression and shunt  placement   HERNIA REPAIR  04/10/2008   scrotal hernia repair   LEFT HEART CATHETERIZATION WITH CORONARY ANGIOGRAM N/A 01/30/2013   Procedure: LEFT HEART CATHETERIZATION WITH CORONARY ANGIOGRAM;  Surgeon: Larey Dresser, MD;  Location: [facility] CATH LAB;  Service: Cardiovascular;  Laterality: N/A;   PACEMAKER IMPLANT N/A 08/29/2020   Procedure: PACEMAKER IMPLANT;  Surgeon: [name], MD;  Location: [place] CV LAB;  Service: Cardiovascular;  Laterality: N/A;   ROTATOR CUFF REPAIR     for chronic rotator cuff injury    Social History:  reports that he quit smoking about 65 years ago. His smoking use included cigarettes. He has quit using smokeless tobacco.  His smokeless tobacco use included chew. He reports that he does not drink alcohol and does not use drugs.  Allergies  Allergen Reactions   Penicillins     Unknown reaction    Family History  Problem Relation Age of Onset   Heart attack Father 44   Aneurysm Mother        brain aneurysm   Coronary artery disease Brother        with CABG   Diabetes Brother    Colon cancer Neg Hx    Colon polyps Neg Hx      Prior to Admission medications   Medication Sig Start Date End Date Taking? Authorizing Provider  acetaminophen (TYLENOL) 325 MG tablet Take 1-2 tablets (325-650 mg total) by mouth every 4 (four) hours as needed for mild pain. 06/15/18   Love, Ivan Anchors, PA-C  albuterol (VENTOLIN HFA) 108 (90 Base) MCG/ACT inhaler Inhale 2 puffs into the lungs every 4 (four) hours as needed for wheezing or shortness of breath. 12/27/19   Daniel Frizzle, MD  atorvastatin (LIPITOR) 80 MG tablet TAKE 1 TABLET BY MOUTH EVERY DAY AT 6 PM 01/19/21   Daniel Frizzle, MD  carbamazepine (CARBATROL) 300 MG 12 hr capsule TAKE 1 CAPSULE(300 MG) BY MOUTH TWICE DAILY 01/19/21   Daniel Frizzle, MD  cloNIDine (CATAPRES) 0.2 MG tablet TAKE 1 TABLET(0.2 MG) BY MOUTH TWICE  DAILY 06/24/21   Daniel Frizzle, MD  donepezil (ARICEPT) 5 MG tablet Take 1 tablet (5 mg total) by mouth at bedtime. 07/16/21   Daniel Frizzle, MD  escitalopram (LEXAPRO) 5 MG tablet TAKE 1 TABLET BY MOUTH EVERY DAY AT BEDTIME 03/12/21   Daniel Frizzle, MD  furosemide (LASIX) 20 MG tablet TAKE 1/2 TABLET(10 MG) BY MOUTH DAILY 08/13/21   Daniel Frizzle, MD  glucose blood test strip 1 each by Other route as needed. Use as instructed Patient not taking: Reported on 09/18/2021 08/25/18   Alycia Rossetti, MD  glucose monitoring kit (FREESTYLE) monitoring kit 1 each by Does not apply route as needed. 08/25/18   Alycia Rossetti, MD  HYDROcodone-acetaminophen [facility]) 5-325 MG tablet Take 1 tablet by mouth every  6 (six) hours as needed for moderate pain. 07/07/20   Daniel Frizzle, MD  isosorbide mononitrate (IMDUR) 60 MG 24 hr tablet Take 0.5 tablets (30 mg total) by mouth daily. 07/01/20   Lorella Nimrod, MD  Lancets (ACCU-CHEK SAFE-T PRO) lancets 1 each by Other route as needed. Use as instructed 08/25/18   Alycia Rossetti, MD  levETIRAcetam (KEPPRA) 250 MG tablet TAKE 1 TABLET(250 MG) BY MOUTH TWICE DAILY 08/27/21   Hassell Done, Mary-Margaret, FNP  levothyroxine (SYNTHROID) 88 MCG tablet Take 1 tablet (88 mcg total) by mouth daily. 05/12/21   Daniel Frizzle, MD  losartan (COZAAR) 100 MG tablet TAKE 1/2 TABLET BY MOUTH DAILY 07/06/21   Daniel Frizzle, MD  Multiple Vitamins-Minerals (MULTIVITAMINS THER. W/MINERALS) TABS Take 1 tablet by mouth at bedtime.      [provider]  MYRBETRIQ 50 MG TB24 tablet Take 50 mg by mouth daily. 08/07/20   [provider]  rivaroxaban (XARELTO) 20 MG TABS tablet Take 1 tablet (20 mg total) by mouth daily with supper. 09/18/21   Minus Breeding, MD  tamsulosin (FLOMAX) 0.4 MG CAPS capsule TAKE 1 CAPSULE(0.4 MG) BY MOUTH DAILY AFTER AND SUPPER 05/13/21   Daniel Frizzle, MD    Physical Exam: Vitals:   09/27/21 1530 09/27/21 1600 09/27/21 1812  09/27/21 1822  BP: (!) 171/77 (!) 175/72 (!) 205/80 (!) 194/84  Pulse: 64 69 66 67  Resp: 14 (!) _0 Temp:   98.8 F (37.1 C) 98.8 F (37.1 C)  TempSrc:   Oral Oral  SpO2: 98% 97% 99% 98%  Weight:    76.5 kg  Height:    5' 8.5" (1.74 m)   Constitutional: Resting supine in bed, NAD, calm, comfortable Eyes: EOMI, lids and conjunctivae normal ENMT: Mucous membranes are moist. Posterior pharynx clear of any exudate or lesions.Normal dentition.  Neck: normal, supple, no masses. Respiratory: clear to auscultation bilaterally, no wheezing, no crackles. Normal respiratory effort. No accessory muscle use.  Cardiovascular: Regular rate and rhythm, no murmurs / rubs / gallops. No extremity edema. 2+ pedal pulses.  PPM in place left upper chest. Abdomen: no tenderness, no masses palpated. No hepatosplenomegaly. Bowel sounds positive.  Musculoskeletal: no clubbing / cyanosis. No joint deformity upper and lower extremities. Good ROM, no contractures. Normal muscle tone.  Skin: Large lipoma proximal right forearm.  No rashes, lesions, ulcers. No induration Neurologic: CN 2-12 grossly intact. Sensation slightly diminished right lateral lower leg otherwise intact. Strength 5/5 in all 4.  Psychiatric: Normal judgment and insight. Alert and oriented x 3. Normal mood.   EKG: Personally reviewed. AV paced rhythm, rate 60.  Similar to prior.  Assessment/Plan Principal Problem:   Right sided numbness Active Problems:   HTN (hypertension)   History of cardioembolic cerebrovascular accident (CVA)   Paroxysmal atrial flutter (HCC)   CAD S/P percutaneous coronary angioplasty   Dyslipidemia   Hypothyroidism   Controlled type 2 diabetes mellitus with complication, without long-term current use of insulin (HCC)   Seizure disorder (HCC)   Chronic diastolic HF (heart failure) (HCC)   Timothey Dahlstrom Coll is a 88 y.o. male with medical history significant for paroxysmal atrial flutter on Xarelto, CAD s/p  DES, CHB s/p PPM, history of CVA with mild residual right sided numbness, seizure disorder, T2DM, HTN, HLD, BPH who is admitted with acute on chronic right-sided numbness.  Assessment and Plan: * Right sided numbness History of cardiomyopathy CVA with residual right-sided numbness Patient reports worsening  right-sided numbness compared to his baseline over the last 2 days.  Will obtain MRI brain to rule out acute stroke.  If negative will consider this recrudescence of prior stroke symptoms in setting of accelerated hypertension.  Previous stroke was cardioembolic after cardiac cath in 2014. -CT head negative for acute changes, old bilateral basal ganglia and thalamic lunar infarcts seen -Follow MRI brain -Allow permissive hypertension pending MRI brain, IV labetalol as needed -Continue Xarelto -PT/OT  HTN (hypertension) Holding home antihypertensives pending MRI brain.  If negative for acute CVA then we will restart home meds.  Use IV labetalol as needed for now for SBP >220.  Paroxysmal atrial flutter (HCC) CHB s/p PPM In AV paced rhythm on admission.  Continue Xarelto.  CAD S/P percutaneous coronary angioplasty Denies any chest pain.  Not on antiplatelets since he is on Xarelto.  Resume Imdur and atorvastatin.  Chronic diastolic HF (heart failure) (HCC) Euvolemic on admission.  Continue home Lasix 10 mg daily.  Seizure disorder (HCC) Continue carbamazepine and Keppra.  Controlled type 2 diabetes mellitus with complication, without long-term current use of insulin (HCC) Diet controlled, last A1c 5.8%.  Continue to monitor.  Hypothyroidism Continue Synthroid.  Dyslipidemia Continue atorvastatin.  DVT prophylaxis:  rivaroxaban (XARELTO) tablet 20 mg   Code Status: Full code, confirmed with patient on admission Family Communication: Discussed with patient's spouse and daughter at bedside Disposition Plan: From home and likely discharge to home pending clinical  progress Consults called: None Severity of Illness: The appropriate patient status for this patient is OBSERVATION. Observation status is judged to be reasonable and necessary in order to provide the required intensity of service to ensure the patient's safety. The patient's presenting symptoms, physical exam findings, and initial radiographic and laboratory data in the context of their medical condition is felt to place them at decreased risk for further clinical deterioration. Furthermore, it is anticipated that the patient will be medically stable for discharge from the hospital within 2 midnights of admission.   Zada Finders MD [facility]  If 7PM-7AM, please contact night-coverage [email or web address]  09/27/2021, 7:49 PM

## 2021-09-27 NOTE — Assessment & Plan Note (Addendum)
Denies any chest pain.  Not on antiplatelets since he is on Xarelto.  Resume Imdur and atorvastatin.

## 2021-09-27 NOTE — Hospital Course (Signed)
[name] is [address]ulma Court 88 y.o. male with medical history significant for paroxysmal atrial flutter on Xarelto, CAD s/p DES, CHB s/p PPM, history of CVA with mild residual right sided numbness, seizure disorder, T2DM, HTN, HLD, BPH who is admitted with acute on chronic right-sided numbness.  MRI negative for acute stroke.  Symptoms thought to be recrudescence of his prior stroke symptoms in the setting of significant hypertension.  See below for additional details

## 2021-09-27 NOTE — Assessment & Plan Note (Addendum)
History of cardiomyopathy CVA with residual right-sided numbness Patient reports worsening right-sided numbness compared to his baseline over the last 2 days.  Will obtain MRI brain to rule out acute stroke.  If negative will consider this recrudescence of prior stroke symptoms in setting of accelerated hypertension.  Previous stroke was cardioembolic after cardiac cath in 2014. -CT head negative for acute changes, old bilateral basal ganglia and thalamic lunar infarcts seen -Follow MRI brain -Allow permissive hypertension pending MRI brain, IV labetalol as needed -Continue Xarelto -PT/OT

## 2021-09-27 NOTE — ED Notes (Signed)
[place] not working properly. Pt cleaned and changed and repositioned in bed. Pt using urinal effectively

## 2021-09-27 NOTE — Assessment & Plan Note (Signed)
Diet controlled, last A1c 5.8%.  Continue to monitor.

## 2021-09-27 NOTE — Plan of Care (Signed)
  Problem: Education: Goal: Knowledge of General Education information will improve Description: Including pain rating scale, medication(s)/side effects and non-pharmacologic comfort measures Outcome: Progressing   Problem: Health Behavior/Discharge Planning: Goal: Ability to manage health-related needs will improve Outcome: Progressing   Problem: Clinical Measurements: Goal: Ability to maintain clinical measurements within normal limits will improve Outcome: Progressing Goal: Will remain free from infection Outcome: Progressing Goal: Diagnostic test results will improve Outcome: Progressing Goal: Respiratory complications will improve Outcome: Progressing Goal: Cardiovascular complication will be avoided Outcome: Progressing   Problem: Activity: Goal: Risk for activity intolerance will decrease Outcome: Progressing   Problem: Nutrition: Goal: Adequate nutrition will be maintained Outcome: Progressing   Problem: Coping: Goal: Level of anxiety will decrease Outcome: Progressing   Problem: Elimination: Goal: Will not experience complications related to bowel motility Outcome: Progressing Goal: Will not experience complications related to urinary retention Outcome: Progressing   Problem: Pain Managment: Goal: General experience of comfort will improve Outcome: Progressing   Problem: Safety: Goal: Ability to remain free from injury will improve Outcome: Progressing   Problem: Skin Integrity: Goal: Risk for impaired skin integrity will decrease Outcome: Progressing   Problem: Education: Goal: Knowledge of disease or condition will improve Outcome: Progressing Goal: Knowledge of secondary prevention will improve (SELECT ALL) Outcome: Progressing Goal: Knowledge of patient specific risk factors will improve (INDIVIDUALIZE FOR PATIENT) Outcome: Progressing Goal: Individualized Educational Video(s) Outcome: Progressing   Problem: Self-Care: Goal: Ability to  participate in self-care as condition permits will improve Outcome: Progressing

## 2021-09-27 NOTE — Assessment & Plan Note (Addendum)
CHB s/p PPM In AV paced rhythm on admission.  Continue Xarelto. Question regarding ideal anticoagulant as he's on tegretol (looks like he was transitioned to xarelto from coumadin with concerns for ability to be adherent with mild cognitive issues) -> will continue xarelto at this time, defer anticoagulation questions outpatient.  Patient prefers to remain on xarelto notably.  Follow with Dr. Tanya Nones outpatient.

## 2021-09-27 NOTE — ED Provider Notes (Signed)
Emergency Department Provider Note   I have reviewed the triage vital signs and the nursing notes.   HISTORY  Chief Complaint Hypertension   HPI [name] is a 88 y.o. male with past medical history of hypertension, heart block with pacemaker, diabetes, prior stroke with right-sided weakness/numbness (mild) presents emergency department with elevated blood pressures, increased gait instability.  Patient feels subjective increase in the amount of numbness in the right side of his face, arm, and leg.  He does have chronic discomfort in the right knee and initially thought that might be contributing to some of his symptoms but his daughter at bedside states he has seen much more unstable starting today.  He has noticed that over the past 2 days he has had significant increase in his blood pressure despite being compliant with medications.  He states he typically runs in the 332-951 systolic range but over the past 24 hours has been closer to the 884 systolic range  Past Medical History:  Diagnosis Date   Atrial flutter (HCC)    BPH (benign prostatic hyperplasia)    Coronary artery disease    a. s/p MI tx with Cypher DES to pRCA in 4/04;  b. Echocardiogram 7/09: Normal LV function.  c. Nuclear study 3/13 no ischemia;  d. ETT 2/14 neg;  e. admx with CP => [facility] (01/30/2013):  pLAD 40-50%, oD1 70-80%, oOM1 40%, pRCA stent patent, mid RCA 30%, EF 60-65%. => med Rx.   DDD (degenerative disc disease)    Diabetes mellitus without complication ([place])    Dyslipidemia    Gait disorder 07/13/2018   Hemorrhoids    Hx MRSA infection    left buttocks abscess   Hx of echocardiogram    a. Echocardiogram (01/31/2013): Mild focal basal and mild concentric hypertrophy of the septum, EF 50-55%, normal wall motion, grade 1 diastolic dysfunction, trivial AI, MAC, mild LAE, PASP 35   Hyperlipidemia    Hypertension    Hypothyroidism    Meniere's disease    Status post shunt   Myocardial infarction  [facility]) 2008   Occlusion and stenosis of carotid artery without mention of cerebral infarction    40-59% on carotid doppler 2014; [place] (01/2013): R 1-39%, L 60-79%   Rotator cuff injury    chronic rotator cuff injury status post repair   Seizure disorder ([place])    Stroke ([place])    a. 01/2013=> post cardiac cath CVA to L post communicating artery system; R sided weakness   Syncope     Review of Systems  Constitutional: No fever/chills Eyes: No visual changes. ENT: No sore throat. Cardiovascular: Denies chest pain. Positive elevated BP. Respiratory: Denies shortness of breath. Gastrointestinal: No abdominal pain.  No nausea, no vomiting.  No diarrhea.  No constipation. Genitourinary: Negative for dysuria. Musculoskeletal: Negative for back pain. Skin: Negative for rash. Neurological: Negative for headaches. Increased right face/arm/leg numbness and gait instability.   ____________________________________________   PHYSICAL EXAM:  VITAL SIGNS: ED Triage Vitals [09/26/21 2336]  Enc Vitals Group     BP (!) 212/83     Pulse Rate 63     Resp 18     Temp 98.3 F (36.8 C)     Temp src      SpO2 96 %    Constitutional: Alert and oriented. Well appearing and in no acute distress. Eyes: Conjunctivae are normal.  Head: Atraumatic. Nose: No congestion/rhinnorhea. Mouth/Throat: Mucous membranes are moist.   Neck: No stridor. Cardiovascular: Normal rate, regular  rhythm. Good peripheral circulation. Grossly normal heart sounds.   Respiratory: Normal respiratory effort.  No retractions. Lungs CTAB. Gastrointestinal: Soft and nontender. No distention.  Musculoskeletal: No lower extremity tenderness nor edema. No gross deformities of extremities. Neurologic:  Normal speech and language. 4+/5 strength in the RUE and RLE. Subjective decreased sensation on the right. Normal sensation on the left. No facial asymmetry.  Skin:  Skin is warm, dry and intact. No rash  noted.  ____________________________________________   LABS (all labs ordered are listed, but only abnormal results are displayed)  Labs Reviewed  COMPREHENSIVE METABOLIC PANEL - Abnormal; Notable for the following components:      Result Value   BUN 28 (*)    Total Protein 6.4 (*)    All other components within normal limits  CBC WITH DIFFERENTIAL/PLATELET - Abnormal; Notable for the following components:   RBC 3.74 (*)    Hemoglobin 11.8 (*)    HCT 35.4 (*)    Eosinophils Absolute 0.6 (*)    All other components within normal limits  URINALYSIS, ROUTINE W REFLEX MICROSCOPIC - Abnormal; Notable for the following components:   Color, Urine COLORLESS (*)    All other components within normal limits  URINE CULTURE  TROPONIN I (HIGH SENSITIVITY)  TROPONIN I (HIGH SENSITIVITY)   ____________________________________________  EKG   EKG Interpretation  Date/Time:  Saturday September 26 2021 23:51:25 EDT Ventricular Rate:  60 PR Interval:  180 QRS Duration: 162 QT Interval:  464 QTC Calculation: 464 R Axis:   3 Text Interpretation: AV dual-paced rhythm Abnormal ECG When compared with ECG of 29-Aug-2020 17:00, Vent. rate has decreased BY   2 BPM Confirmed by Nanda Quinton [phone]) on 09/26/2021 11:59:34 PM        ____________________________________________  RADIOLOGY  CT Head Wo Contrast  Result Date: 09/27/2021 CLINICAL DATA:  TIA, hypertension EXAM: CT HEAD WITHOUT CONTRAST TECHNIQUE: Contiguous axial images were obtained from the base of the skull through the vertex without intravenous contrast. RADIATION DOSE REDUCTION: This exam was performed according to the departmental dose-optimization program which includes automated exposure control, adjustment of the mA and/or kV according to patient size and/or use of iterative reconstruction technique. COMPARISON:  07/08/2020 FINDINGS: Brain: Multiple old bilateral basal ganglia and thalamic lacunar infarcts. There is atrophy and  chronic small vessel disease changes. No acute intracranial abnormality. Specifically, no hemorrhage, hydrocephalus, mass lesion, acute infarction, or significant intracranial injury. Vascular: No hyperdense vessel or unexpected calcification. Skull: No acute calvarial abnormality. Sinuses/Orbits: No acute findings Other: None IMPRESSION: Old bilateral basal ganglia and thalamic lacunar infarcts. Atrophy, chronic microvascular disease. No acute intracranial abnormality. Electronically Signed   By: Rolm Baptise M.D.   On: 09/27/2021 01:24   DG Chest Portable 1 View  Result Date: 09/27/2021 CLINICAL DATA:  Hypertension EXAM: PORTABLE CHEST 1 VIEW COMPARISON:  08/29/2020 FINDINGS: Cardiac shadow is mildly prominent but accentuated by the portable technique. Pacing device is again seen. The lungs are well aerated bilaterally. No focal infiltrate or sizable effusion is seen. No bony abnormality is noted. IMPRESSION: No acute abnormality noted. Electronically Signed   By: Inez Catalina M.D.   On: 09/27/2021 00:59    ____________________________________________   PROCEDURES  Procedure(s) performed:   Procedures  None  ____________________________________________   INITIAL IMPRESSION / ASSESSMENT AND PLAN / ED COURSE  Pertinent labs & imaging results that were available during my care of the patient were reviewed by me and considered in my medical decision making (see chart for details).  This patient is Presenting for Evaluation of stroke like symptoms, which does require a range of treatment options, and is a complaint that involves a high risk of morbidity and mortality.  The Differential Diagnoses includes but is not exclusive to alcohol, illicit or prescription medications, intracranial pathology such as stroke, intracerebral hemorrhage, fever or infectious causes including sepsis, hypoxemia, uremia, trauma, endocrine related disorders such as diabetes, hypoglycemia, thyroid-related diseases,  etc.  I did obtain Additional Historical Information from daughter at bedside.  I decided to review pertinent External Data, and in summary patient with prior CVA with right sided deficits and MRI compatible pacemaker placed in 2022.   Clinical Laboratory Tests Ordered, included no leukocytosis.  Mild anemia at 11.8.  Troponin of 15 is within normal limits.  No acute kidney injury.  Radiologic Tests Ordered, included CT head and CXR. I independently interpreted the images and agree with radiology interpretation.   Cardiac Monitor Tracing which shows AV paced rhythm.   Social Determinants of Health Risk patient with a prior smoking history.  Consult complete with Hospitalist, Dr. Marlowe Sax, with plan for admit.   Medical Decision Making: Summary:  Patient presents emergency department increased gait instability, elevated blood pressures, increased weakness/numbness on the right compared to his baseline.  He does have residual stroke deficits but feels different in the past 24 hours.  He is outside of any code stroke window and this was not activated.  Plan for screening noncontrast head CT, labs, reassess.  Reevaluation with update and discussion with patient and daughter at bedside. Plan for admit. They are in agreement with plan.   Disposition: admit  ____________________________________________  FINAL CLINICAL IMPRESSION(S) / ED DIAGNOSES  Final diagnoses:  Stroke-like symptoms    Note:  This document was prepared using Dragon voice recognition software and may include unintentional dictation errors.  Nanda Quinton, MD, [facility] Emergency Medicine    Jayanna Kroeger, [facility], MD 09/27/21 [phone]

## 2021-09-27 NOTE — Plan of Care (Signed)
[facility] will assume care on arrival to accepting facility. Until arrival, care as per EDP. However, [facility] available 24/7 for questions and assistance.  Nursing staff, please page [facility] Admits and Consults ([phone]) as soon as the patient arrives to the hospital.   

## 2021-09-27 NOTE — Plan of Care (Signed)
  Problem: Clinical Measurements: Goal: Ability to maintain clinical measurements within normal limits will improve Outcome: Progressing Goal: Will remain free from infection Outcome: Progressing Goal: Respiratory complications will improve Outcome: Not Applicable Goal: Cardiovascular complication will be avoided Outcome: Progressing   Problem: Nutrition: Goal: Adequate nutrition will be maintained Outcome: Progressing   Problem: Coping: Goal: Level of anxiety will decrease Outcome: Progressing   Problem: Pain Managment: Goal: General experience of comfort will improve Outcome: Progressing   Problem: Safety: Goal: Ability to remain free from injury will improve Outcome: Progressing   Problem: Skin Integrity: Goal: Risk for impaired skin integrity will decrease Outcome: Progressing   Problem: Education: Goal: Knowledge of disease or condition will improve Outcome: Progressing Goal: Knowledge of secondary prevention will improve (SELECT ALL) Outcome: Progressing Goal: Knowledge of patient specific risk factors will improve (INDIVIDUALIZE FOR PATIENT) Outcome: Progressing   Problem: Self-Care: Goal: Ability to participate in self-care as condition permits will improve Outcome: Progressing

## 2021-09-27 NOTE — ED Notes (Signed)
Family left at this time, pt given another warm blanket and sts that he is going to try to sleep, will contact daughter kim with updates.

## 2021-09-27 NOTE — ED Notes (Signed)
Pt alert and oriented.

## 2021-09-27 NOTE — Assessment & Plan Note (Signed)
Euvolemic on admission.  Continue home Lasix 10 mg daily.

## 2021-09-28 ENCOUNTER — Ambulatory Visit: Payer: Medicare Other | Admitting: Family Medicine

## 2021-09-28 DIAGNOSIS — R200 Anesthesia of skin: Secondary | ICD-10-CM | POA: Diagnosis not present

## 2021-09-28 LAB — CBC
HCT: 39.8 % (ref 39.0–52.0)
Hemoglobin: 12.8 g/dL — ABNORMAL LOW (ref 13.0–17.0)
MCH: 31.4 pg (ref 26.0–34.0)
MCHC: 32.2 g/dL (ref 30.0–36.0)
MCV: 97.8 fL (ref 80.0–100.0)
Platelets: 258 10*3/uL (ref 150–400)
RBC: 4.07 MIL/uL — ABNORMAL LOW (ref 4.22–5.81)
RDW: 15.2 % (ref 11.5–15.5)
WBC: 8.3 10*3/uL (ref 4.0–10.5)
nRBC: 0 % (ref 0.0–0.2)

## 2021-09-28 LAB — BASIC METABOLIC PANEL
Anion gap: 10 (ref 5–15)
BUN: 23 mg/dL (ref 8–23)
CO2: 25 mmol/L (ref 22–32)
Calcium: 8.8 mg/dL — ABNORMAL LOW (ref 8.9–10.3)
Chloride: 103 mmol/L (ref 98–111)
Creatinine, Ser: 1.13 mg/dL (ref 0.61–1.24)
GFR, Estimated: >60 mL/min (ref >60)
Glucose, Bld: 100 mg/dL — ABNORMAL HIGH (ref 70–99)
Potassium: 4.1 mmol/L (ref 3.5–5.1)
Sodium: 138 mmol/L (ref 135–145)

## 2021-09-28 NOTE — Evaluation (Signed)
Occupational Therapy Evaluation Patient Details Name: [name] MRN: [id number] DOB: [birth year] Today's Date: 09/28/2021   History of Present Illness Pt is an 88 y/o M presenting to EED on 7/8 with unsteadiness, RUE numbness, and HTN. CT negative, MRI pending. PMH includes DDD, DM, dyslipidemia, HTN, HLD, MI, and CVA with residual R weakness.   Clinical Impression   Pt independent at baseline with ADLs, lives with spouse and uses rollator for all mobility. Pt has family nearby that assist with IADLs. Currently pt min guard-min A for ADLs, bed mobility, and transfers with RW. Pt with RUE weakness and decreased ROM, however per pt this is baseline from prior CVA. Pt with decreased fine motor coordination/strength in R hand during ADL tasks, administered squeeze ball for strengthening. Pt verbalized understanding. Discussed having family supervise/double check pt with financial and med mgmt at home. Pt presenting with impairments listed below, will follow acutely. Recommend HHOT at d/c.     Recommendations for follow up therapy are one component of a multi-disciplinary discharge planning process, led by the attending physician.  Recommendations may be updated based on patient status, additional functional criteria and insurance authorization.   Follow Up Recommendations  Home health OT    Assistance Recommended at Discharge Set up Supervision/Assistance  Patient can return home with the following A little help with bathing/dressing/bathroom;Assistance with cooking/housework;Direct supervision/assist for medications management;Direct supervision/assist for financial management;Assist for transportation;Help with stairs or ramp for entrance;A little help with walking and/or transfers    Functional Status Assessment  Patient has had a recent decline in their functional status and demonstrates the ability to make significant improvements in function in a reasonable and predictable amount of  time.  Equipment Recommendations  None recommended by OT;Other (comment) (pt has all needed DME)    Recommendations for Other Services PT consult     Precautions / Restrictions Precautions Precautions: Fall Restrictions Weight Bearing Restrictions: No      Mobility Bed Mobility Overal bed mobility: Needs Assistance Bed Mobility: Sit to Supine, Supine to Sit     Supine to sit: Min guard Sit to supine: Min guard        Transfers Overall transfer level: Needs assistance Equipment used: Rolling walker (2 wheels) Transfers: Sit to/from Stand Sit to Stand: Min assist           General transfer comment: increased time, cues to push from bed      Balance Overall balance assessment: Needs assistance Sitting-balance support: Feet supported, Bilateral upper extremity supported Sitting balance-Leahy Scale: Good Sitting balance - Comments: can reach outside BOS without LOB   Standing balance support: During functional activity, Reliant on assistive device for balance Standing balance-Leahy Scale: Poor Standing balance comment: reliant on external support                           ADL either performed or assessed with clinical judgement   ADL Overall ADL's : Needs assistance/impaired Eating/Feeding: Set up;Sitting   Grooming: Set up;Sitting;Standing   Upper Body Bathing: Minimal assistance;Sitting;Standing   Lower Body Bathing: Minimal assistance;Sitting/lateral leans;Sit to/from stand   Upper Body Dressing : Minimal assistance;Standing;Sitting   Lower Body Dressing: Minimal assistance;Sitting/lateral leans;Sit to/from stand Lower Body Dressing Details (indicate cue type and reason): to don/doff brief Toilet Transfer: Min guard;Rolling walker (2 wheels);Ambulation;Regular Glass blower/designer Details (indicate cue type and reason): simulatd in room         Functional mobility during ADLs: Min guard;Minimal  assistance;Rolling walker (2  wheels);Cueing for safety;Cueing for sequencing       Vision Baseline Vision/History: 1 Wears glasses Ability to See in Adequate Light: 0 Adequate Patient Visual Report: No change from baseline Vision Assessment?: No apparent visual deficits Additional Comments: pt reporting dry R eye since prior CVA     Perception     Praxis      Pertinent Vitals/Pain Pain Assessment Pain Assessment: Faces Pain Score: 2  Faces Pain Scale: Hurts a little bit Pain Location: R knee Pain Descriptors / Indicators: Discomfort Pain Intervention(s): Limited activity within patient's tolerance, Monitored during session     Hand Dominance Right   Extremity/Trunk Assessment Upper Extremity Assessment Upper Extremity Assessment: RUE deficits/detail;Generalized weakness RUE Deficits / Details: 3+/5 strength, ~50% shoulder ROM, states this is baseline from prior CVA RUE Sensation: WNL RUE Coordination: decreased fine motor;decreased gross motor   Lower Extremity Assessment Lower Extremity Assessment: Defer to PT evaluation   Cervical / Trunk Assessment Cervical / Trunk Assessment: Normal   Communication Communication Communication: No difficulties (son reporting pt occasionally slurs speech during conversation)   Cognition Arousal/Alertness: Awake/alert Behavior During Therapy: WFL for tasks assessed/performed Overall Cognitive Status: Within Functional Limits for tasks assessed                                       General Comments  VSS on RA, son present in room and supportive during session    Exercises     Shoulder Instructions      Home Living Family/patient expects to be discharged to:: Private residence Living Arrangements: Spouse/significant other Available Help at Discharge: Family Type of Home: House Home Access: Stairs to enter Technical brewer of Steps: 2   Home Layout: One level     Bathroom Shower/Tub: Occupational psychologist:  Handicapped height Bathroom Accessibility: Yes How Accessible: Accessible via walker Home Equipment: Rollator (4 wheels);Shower seat;Grab bars - toilet;Grab bars - tub/shower   Additional Comments: wife has dementia dx      Prior Functioning/Environment Prior Level of Function : Independent/Modified Independent             Mobility Comments: using rollator ADLs Comments: does not drive, family assists with driving and med mgmt        OT Problem List: Decreased strength;Decreased range of motion;Decreased activity tolerance;Impaired balance (sitting and/or standing);Impaired UE functional use      OT Treatment/Interventions: Self-care/ADL training;Therapeutic exercise;Energy conservation;DME and/or AE instruction;Therapeutic activities;Patient/family education;Balance training;Cognitive remediation/compensation;Visual/perceptual remediation/compensation;Neuromuscular education    OT Goals(Current goals can be found in the care plan section) Acute Rehab OT Goals Patient Stated Goal: none stated OT Goal Formulation: With patient Time For Goal Achievement: 10/12/21 Potential to Achieve Goals: Good  OT Frequency: Min 2X/week    Co-evaluation              AM-PAC OT "6 Clicks" Daily Activity     Outcome Measure Help from another person eating meals?: None Help from another person taking care of personal grooming?: None Help from another person toileting, which includes using toliet, bedpan, or urinal?: A Little Help from another person bathing (including washing, rinsing, drying)?: A Little Help from another person to put on and taking off regular upper body clothing?: A Little Help from another person to put on and taking off regular lower body clothing?: A Little 6 Click Score: 20   End of Session Equipment  Utilized During Treatment: Gait belt;Rolling walker (2 wheels) Nurse Communication: Mobility status  Activity Tolerance: Patient tolerated treatment well Patient  left: in bed;with call bell/phone within reach;with bed alarm set;with family/visitor present  OT Visit Diagnosis: Unsteadiness on feet (R26.81);Other abnormalities of gait and mobility (R26.89);Muscle weakness (generalized) (M62.81)                Time: 3143-8887 OT Time Calculation (min): 26 min Charges:  OT General Charges $OT Visit: 1 Visit OT Evaluation $OT Eval Low Complexity: 1 Low OT Treatments $Self Care/Home Management : 8-22 mins  Lynnda Child, OTD, OTR/L Acute Rehab [phone]) [phone] - [place] 09/28/2021, 8:25 AM

## 2021-09-28 NOTE — TOC Initial Note (Signed)
Transition of Care [facility]) - Initial/Assessment Note    Patient Details  Name: [name] MRN: [id number] Date of Birth: 08/29/1933  [facility] CM/SW Contact:    Pollie Friar, RN Phone Number: 09/28/2021, 3:34 PM  Clinical Narrative:                 Pt is from home with his spouse that has mild dementia. Pt manages his medications and his spouses.  Pt states his children do the transportation for appointments and etc. Recommendations for home health services. Pt has no preference for [facility] agency. CM will have arranged prior to d/c.  Awaiting a MRI.  TOC following.    Expected Discharge Plan: [place] Barriers to Discharge: Continued Medical Work up   Patient Goals and CMS Choice   CMS Medicare.gov Compare Post Acute Care list provided to:: Patient Choice offered to / list presented to : Patient  Expected Discharge Plan and Services Expected Discharge Plan: [place]   Discharge Planning Services: CM Consult Post Acute Care Choice: [place] arrangements for the past 2 months: Single Family Home                           HH Arranged: PT, OT          Prior Living Arrangements/Services Living arrangements for the past 2 months: Single Family Home Lives with:: Spouse Patient language and need for interpreter reviewed:: Yes Do you feel safe going back to the place where you live?: Yes          Current home services: DME (walker/ shower seat/ bars in shower) Criminal Activity/Legal Involvement Pertinent to Current Situation/Hospitalization: No - Comment as needed  Activities of Daily Living Home Assistive Devices/Equipment: Walker (specify type) ADL Screening (condition at time of admission) Patient's cognitive ability adequate to safely complete daily activities?: Yes Is the patient deaf or have difficulty hearing?: No Does the patient have difficulty seeing, even when wearing glasses/contacts?:  No Does the patient have difficulty concentrating, remembering, or making decisions?: No Patient able to express need for assistance with ADLs?: Yes Does the patient have difficulty dressing or bathing?: No Independently performs ADLs?: Yes (appropriate for developmental age) Does the patient have difficulty walking or climbing stairs?: Yes Weakness of Legs: Right Weakness of Arms/Hands: Right  Permission Sought/Granted                  Emotional Assessment Appearance:: Appears stated age Attitude/Demeanor/Rapport: Engaged Affect (typically observed): Accepting Orientation: : Oriented to Self, Oriented to Place, Oriented to  Time, Oriented to Situation   Psych Involvement: No (comment)  Admission diagnosis:  Stroke-like symptoms [R29.90] Patient Active Problem List   Diagnosis Date Noted   Right sided numbness 09/27/2021   Pain in joint of right knee 12/01/2020   Bradycardia 07/29/2020   Second degree AV block, Mobitz type II    Syncope 07/08/2020   Acute midline low back pain without sciatica    Lumbar disc disease with radiculopathy 06/30/2020   Weakness of both lower extremities 06/30/2020   Chronic diastolic HF (heart failure) ([place]) 01/16/2020   Acute exacerbation of CHF (congestive heart failure) ([place]) 08/19/2018   Hypertensive urgency 08/19/2018   Gait disorder 07/13/2018   Recurrent syncope 07/13/2018   E. coli UTI 06/19/2018   Leucocytosis 06/19/2018   Normal pressure hydrocephalus ([place]) 06/01/2018   Altered mental status 05/31/2018  AMS (altered mental status) 05/30/2018   Seizure disorder ([place]) 05/30/2018   Fracture of proximal phalanx of finger fifth 06/17/2017 07/21/2017   Upper airway cough syndrome 11/01/2016   Paroxysmal atrial flutter (HCC) 08/05/2016   Coronary artery disease involving native coronary artery of native heart without angina pectoris 08/05/2016   Chronic anticoagulation 12/31/2015   History of seizures 54/65/0354   Acute diastolic  CHF (congestive heart failure) ([place]) 12/24/2015   Pulmonary edema    Paroxysmal atrial fibrillation ([place])    Hypothyroidism    Controlled type 2 diabetes mellitus with complication, without long-term current use of insulin ([place])    Hypertensive emergency 12/20/2015   Special screening for malignant neoplasms, colon 12/07/2013   Bowel habit changes 12/07/2013   Diarrhea 03/09/2013   History of cardioembolic cerebrovascular accident (CVA) 01/31/2013   Chest pain 01/29/2013   Bilateral carotid artery disease ([place])    Loss of coordination 12/03/2012   Morbid obesity due to excess calories ([place]) 08/20/2011   CAD S/P percutaneous coronary angioplasty    Dyslipidemia    HTN (hypertension)    PCP:  [name], MD Pharmacy:   [facility] [place], [place] AT [place] [address] [place] [address] Phone: [phone] Fax: [phone]  [id number] [place], [place]: [phone]: [phone] [place] [place]: [phone]: [phone]    Social Determinants of Health (SDOH) Interventions    Readmission Risk Interventions     No data to display

## 2021-09-28 NOTE — Plan of Care (Signed)
  Problem: Clinical Measurements: Goal: Will remain free from infection Outcome: Progressing Goal: Cardiovascular complication will be avoided Outcome: Progressing   Problem: Activity: Goal: Risk for activity intolerance will decrease Outcome: Progressing   Problem: Nutrition: Goal: Adequate nutrition will be maintained Outcome: Progressing   Problem: Coping: Goal: Level of anxiety will decrease Outcome: Progressing   Problem: Elimination: Goal: Will not experience complications related to bowel motility Outcome: Progressing Goal: Will not experience complications related to urinary retention Outcome: Progressing   Problem: Pain Managment: Goal: General experience of comfort will improve Outcome: Progressing   Problem: Safety: Goal: Ability to remain free from injury will improve Outcome: Progressing   Problem: Skin Integrity: Goal: Risk for impaired skin integrity will decrease Outcome: Progressing   Problem: Education: Goal: Knowledge of disease or condition will improve Outcome: Progressing Goal: Knowledge of secondary prevention will improve (SELECT ALL) Outcome: Progressing Goal: Knowledge of patient specific risk factors will improve (INDIVIDUALIZE FOR PATIENT) Outcome: Progressing   Problem: Self-Care: Goal: Ability to participate in self-care as condition permits will improve Outcome: Progressing

## 2021-09-28 NOTE — Progress Notes (Signed)
PROGRESS NOTE    [name]  EPP:[id number] DOB: 15-Jan-1934 DOA: 09/26/2021 PCP: [name], MD  Chief Complaint  Patient presents with   Hypertension    Brief Narrative:  [name] is [name] 88 y.o. male with medical history significant for paroxysmal atrial flutter on Xarelto, CAD s/p DES, CHB s/p PPM, history of CVA with mild residual right sided numbness, seizure disorder, T2DM, HTN, HLD, BPH who is admitted with acute on chronic right-sided numbness.    Assessment & Plan:   Principal Problem:   Right sided numbness Active Problems:   HTN (hypertension)   History of cardioembolic cerebrovascular accident (CVA)   Paroxysmal atrial flutter (HCC)   CAD S/P percutaneous coronary angioplasty   Dyslipidemia   Hypothyroidism   Controlled type 2 diabetes mellitus with complication, without long-term current use of insulin (HCC)   Seizure disorder (HCC)   Chronic diastolic HF (heart failure) (HCC)   Assessment and Plan: * Right sided numbness History of cardiomyopathy CVA with residual right-sided numbness Patient reports worsening right-sided numbness compared to his baseline over the last 2 days.  Will obtain MRI brain to rule out acute stroke.  If negative will consider this recrudescence of prior stroke symptoms in setting of accelerated hypertension (? Need for neurology c/s, will follow).  Previous stroke was cardioembolic after cardiac cath in 2014. -CT head negative for acute changes, old bilateral basal ganglia and thalamic lunar infarcts seen -Follow MRI brain -Allow permissive hypertension pending MRI brain, IV labetalol as needed -Continue Xarelto -PT/OT  HTN (hypertension) Holding home antihypertensives pending MRI brain.  If negative for acute CVA then we will restart home meds.  Use IV labetalol as needed for now for SBP >220.  Paroxysmal atrial flutter (HCC) CHB s/p PPM In AV paced rhythm on admission.  Continue Xarelto. Question regarding ideal  anticoagulant as he's on tegretol (looks like he was transitioned to xarelto from coumadin with concerns for ability to be adherent with mild cognitive issues) -> will continue xarelto at this time, defer anticoagulation questions outpatient (for now, pending MRI)  CAD S/P percutaneous coronary angioplasty Denies any chest pain.  Not on antiplatelets since he is on Xarelto.  Resume Imdur and atorvastatin.  Dyslipidemia Continue atorvastatin.  Hypothyroidism Continue Synthroid.  Controlled type 2 diabetes mellitus with complication, without long-term current use of insulin (HCC) Diet controlled, last A1c 5.8%.  Continue to monitor.  Chronic diastolic HF (heart failure) (HCC) Euvolemic on admission.  Continue home Lasix 10 mg daily.  Seizure disorder (HCC) Continue carbamazepine and Keppra.          DVT prophylaxis: xarelto Code Status: full Family Communication: none Disposition:   Status is: Observation The patient remains OBS appropriate and will d/c before 2 midnights.  Awaiting MRI   Consultants:  none  Procedures:  none  Antimicrobials:  Anti-infectives (From admission, onward)    None       Subjective: No new complaints  Objective: Vitals:   09/28/21 0430 09/28/21 0904 09/28/21 1155 09/28/21 1637  BP: (!) 174/78 (!) 163/99 132/90 (!) 144/94  Pulse: 66 74 70 67  Resp: '15 20 17 20  '$ Temp: 97.8 F (36.6 C) 98.3 F (36.8 C) 98.6 F (37 C) 97.6 F (36.4 C)  TempSrc: Oral Oral Oral Oral  SpO2: 92% 98% 100% 99%  Weight:      Height:        Intake/Output Summary (Last 24 hours) at 09/28/2021 1821 Last data filed at 09/28/2021 1437  Gross per 24 hour  Intake 793 ml  Output --  Net 793 ml   Filed Weights   09/27/21 1822  Weight: 76.5 kg    Examination:  General exam: Appears calm and comfortable  Respiratory system: unlabored Cardiovascular system: RRR Central nervous system: Alert and oriented.  Symmetric strength on my exam, denies  numbness Extremities: no LEE   Data Reviewed: I have personally reviewed following labs and imaging studies  CBC: Recent Labs  Lab 09/27/21 0035 09/28/21 0252  WBC 7.5 8.3  NEUTROABS 3.8  --   HGB 11.8* 12.8*  HCT 35.4* 39.8  MCV 94.7 97.8  PLT 244 782    Basic Metabolic Panel: Recent Labs  Lab 09/27/21 0035 09/28/21 0252  NA 137 138  K 4.4 4.1  CL 101 103  CO2 26 25  GLUCOSE 90 100*  BUN 28* 23  CREATININE 1.14 1.13  CALCIUM 9.4 8.8*    GFR: Estimated Creatinine Clearance: 46.2 mL/min (by C-G formula based on SCr of 1.13 mg/dL).  Liver Function Tests: Recent Labs  Lab 09/27/21 0035  AST 20  ALT 15  ALKPHOS 77  BILITOT 0.5  PROT 6.4*  ALBUMIN 4.5    CBG: No results for input(s): "GLUCAP" in the last 168 hours.   Recent Results (from the past 240 hour(s))  Urine Culture     Status: Abnormal (Preliminary result)   Collection Time: 09/27/21 12:27 AM   Specimen: Urine, Clean Catch  Result Value Ref Range Status   Specimen Description   Final    URINE, CLEAN CATCH Performed at [facility], [address], [place], [place] [address]    Special Requests   Final    NONE Performed at [facility], [address], [place],  [address]    Culture MULTIPLE SPECIES PRESENT, SUGGEST RECOLLECTION ([place])  Final   Report Status PENDING  Incomplete         Radiology Studies: CT Head Wo Contrast  Result Date: 09/27/2021 CLINICAL DATA:  TIA, hypertension EXAM: CT HEAD WITHOUT CONTRAST TECHNIQUE: Contiguous axial images were obtained from the base of the skull through the vertex without intravenous contrast. RADIATION DOSE REDUCTION: This exam was performed according to the departmental dose-optimization program which includes automated exposure control, adjustment of the mA and/or kV according to patient size and/or use of iterative reconstruction technique. COMPARISON:  07/08/2020 FINDINGS: Brain: Multiple old  bilateral basal ganglia and thalamic lacunar infarcts. There is atrophy and chronic small vessel disease changes. No acute intracranial abnormality. Specifically, no hemorrhage, hydrocephalus, mass lesion, acute infarction, or significant intracranial injury. Vascular: No hyperdense vessel or unexpected calcification. Skull: No acute calvarial abnormality. Sinuses/Orbits: No acute findings Other: None IMPRESSION: Old bilateral basal ganglia and thalamic lacunar infarcts. Atrophy, chronic microvascular disease. No acute intracranial abnormality. Electronically Signed   By: Rolm Baptise M.D.   On: 09/27/2021 01:24   DG Chest Portable 1 View  Result Date: 09/27/2021 CLINICAL DATA:  Hypertension EXAM: PORTABLE CHEST 1 VIEW COMPARISON:  08/29/2020 FINDINGS: Cardiac shadow is mildly prominent but accentuated by the portable technique. Pacing device is again seen. The lungs are well aerated bilaterally. No focal infiltrate or sizable effusion is seen. No bony abnormality is noted. IMPRESSION: No acute abnormality noted. Electronically Signed   By: Inez Catalina M.D.   On: 09/27/2021 00:59        Scheduled Meds:  atorvastatin  80 mg Oral Daily   carbamazepine  300 mg Oral BID   donepezil  5 mg Oral QHS   isosorbide mononitrate  30 mg Oral Daily   levETIRAcetam  250 mg Oral BID   levothyroxine  88 mcg Oral Q0600   rivaroxaban  20 mg Oral Q supper   Continuous Infusions:   LOS: 0 days    Time spent: over 30 min    Fayrene Helper, MD [facility]   To contact the attending provider between 7A-7P or the covering provider during after hours 7P-7A, please log into the web site [email or web address] and access using universal [place] password for that web site. If you do not have the password, please call the hospital operator.  09/28/2021, 6:21 PM

## 2021-09-28 NOTE — Progress Notes (Signed)
Physical Therapy Treatment Patient Details Name: [name] MRN: [id number] DOB: 1934/02/08 Today's Date: 09/28/2021   History of Present Illness Pt is an 88 y/o M presenting to [facility] on 7/8 with unsteadiness, RUE numbness, and HTN. CT negative, MRI pending. PMH includes DDD, DM, dyslipidemia, HTN, HLD, MI, and CVA with residual R weakness.    PT Comments    Pt admitted with above diagnosis. Pt pleasant and agreeable to PT evaluation at this time. PTA, pt was mod I for mobility with use of rollator, lives with spouse who was diagnosed with dementia, but has family around available for support PRN. Pt was independent at baseline with ADLs. Upon PT eval, pt was min G for bed mobility, and min A for transfers and ambulation with a RW tolerating 200 ft with 2 stairs. Pt currently presents with mild balance deficits, decreased strength and power, as well as decreased tolerance to activity. For these reasons, pt would continue to benefit from skilled PT in order to increase functional independence for a safe return home. Will continue to follow acutely.  Recommendations for follow up therapy are one component of a multi-disciplinary discharge planning process, led by the attending physician.  Recommendations may be updated based on patient status, additional functional criteria and insurance authorization.  Follow Up Recommendations  Home health PT     Assistance Recommended at Discharge Intermittent Supervision/Assistance  Patient can return home with the following A little help with walking and/or transfers;A little help with bathing/dressing/bathroom;Assistance with cooking/housework;Direct supervision/assist for medications management;Direct supervision/assist for financial management;Assist for transportation;Help with stairs or ramp for entrance   Equipment Recommendations  None recommended by PT    Recommendations for Other Services       Precautions / Restrictions  Precautions Precautions: Fall Restrictions Weight Bearing Restrictions: No     Mobility  Bed Mobility Overal bed mobility: Needs Assistance Bed Mobility: Supine to Sit     Supine to sit: Min guard     General bed mobility comments: pt with min G for safety    Transfers Overall transfer level: Needs assistance Equipment used: Rolling walker (2 wheels) Transfers: Sit to/from Stand Sit to Stand: Min assist           General transfer comment: min A for cues to stand up straight and push from seated support. increased time    Ambulation/Gait Ambulation/Gait assistance: Min assist Gait Distance (Feet): 200 Feet Assistive device: Rolling walker (2 wheels), 1 person hand held assist Gait Pattern/deviations: Step-through pattern, Decreased stride length, Drifts right/left, Narrow base of support, Trunk flexed Gait velocity: decreased Gait velocity interpretation: <1.31 ft/sec, indicative of household ambulator   General Gait Details: pt with slow and mildly unsteady gait (pt atributed to comfortability with rollator instead of RW) with min A for cues for safe use of RW. pt with c/o R knee discomfort from a fall 12+ years ago.   Stairs Stairs: Yes Stairs assistance: Min guard Stair Management: Two rails, Step to pattern, Forwards Number of Stairs: 2 General stair comments: pt with min G for stairs for safety, use of B hand rails stating "if I go up with my L I'm fine."   Wheelchair Mobility    Modified Rankin (Stroke Patients Only)       Balance Overall balance assessment: Needs assistance Sitting-balance support: Feet supported, Single extremity supported Sitting balance-Leahy Scale: Fair     Standing balance support: During functional activity, Single extremity supported Standing balance-Leahy Scale: Fair Standing balance comment: pt able  to answer phone with 1 hand while holding onto walker with the other hand without LOB.                             Cognition Arousal/Alertness: Awake/alert Behavior During Therapy: WFL for tasks assessed/performed Overall Cognitive Status: Within Functional Limits for tasks assessed                                 General Comments: pt's son in room for beginning of session, pt agreeable to mobility        Exercises      General Comments        Pertinent Vitals/Pain Pain Assessment Pain Assessment: Faces Faces Pain Scale: Hurts a little bit Pain Location: R knee Pain Descriptors / Indicators: Discomfort, Sore Pain Intervention(s): Monitored during session, Repositioned    Home Living Family/patient expects to be discharged to:: Private residence Living Arrangements: Spouse/significant other Available Help at Discharge: Family Type of Home: House Home Access: Stairs to enter Entrance Stairs-Rails: Can reach both Entrance Stairs-Number of Steps: 2   Home Layout: One level Home Equipment: Rollator (4 wheels);Shower seat;Grab bars - toilet;Grab bars - tub/shower Additional Comments: wife has dementia dx    Prior Function            PT Goals (current goals can now be found in the care plan section) Acute Rehab PT Goals Patient Stated Goal: to go home PT Goal Formulation: With patient Time For Goal Achievement: 10/12/21 Potential to Achieve Goals: Good Additional Goals Additional Goal #1: Pt will score > 19/21 on the DGI in order to improve balance and increase functional independence for a safe return home.    Frequency    Min 4X/week      PT Plan      Co-evaluation              AM-PAC PT "6 Clicks" Mobility   Outcome Measure  Help needed turning from your back to your side while in a flat bed without using bedrails?: A Little Help needed moving from lying on your back to sitting on the side of a flat bed without using bedrails?: A Little Help needed moving to and from a bed to a chair (including a wheelchair)?: A Little Help needed standing  up from a chair using your arms (e.g., wheelchair or bedside chair)?: A Little Help needed to walk in hospital room?: A Little Help needed climbing 3-5 steps with a railing? : A Little 6 Click Score: 18    End of Session Equipment Utilized During Treatment: Gait belt Activity Tolerance: Patient tolerated treatment well;No increased pain Patient left: in chair;with call bell/phone within reach;with chair alarm set Nurse Communication: Mobility status PT Visit Diagnosis: Unsteadiness on feet (R26.81);Other abnormalities of gait and mobility (R26.89)     Time: 7989-2119 PT Time Calculation (min) (ACUTE ONLY): 25 min  Charges:  $Gait Training: 8-22 mins                     Havery Moros, MS, [place] Acute Rehabilitation Services Office: [place] 09/28/2021, 12:56 PM

## 2021-09-28 NOTE — Care Management Obs Status (Signed)
[place] NOTIFICATION   Patient Details  Name: KIER SMEAD MRN: [id number] Date of Birth: 1934-03-13   Medicare Observation Status Notification Given:  Yes    Pollie Friar, RN 09/28/2021, 3:23 PM

## 2021-09-29 ENCOUNTER — Observation Stay (HOSPITAL_COMMUNITY): Payer: Medicare Other

## 2021-09-29 DIAGNOSIS — R200 Anesthesia of skin: Secondary | ICD-10-CM | POA: Diagnosis not present

## 2021-09-29 DIAGNOSIS — Z9889 Other specified postprocedural states: Secondary | ICD-10-CM | POA: Diagnosis not present

## 2021-09-29 DIAGNOSIS — I639 Cerebral infarction, unspecified: Secondary | ICD-10-CM | POA: Diagnosis not present

## 2021-09-29 DIAGNOSIS — I6782 Cerebral ischemia: Secondary | ICD-10-CM | POA: Diagnosis not present

## 2021-09-29 LAB — CBC WITH DIFFERENTIAL/PLATELET
Abs Immature Granulocytes: 0.05 10*3/uL (ref 0.00–0.07)
Basophils Absolute: 0.1 10*3/uL (ref 0.0–0.1)
Basophils Relative: 1 %
Eosinophils Absolute: 0.6 10*3/uL — ABNORMAL HIGH (ref 0.0–0.5)
Eosinophils Relative: 5 %
HCT: 39.6 % (ref 39.0–52.0)
Hemoglobin: 13.1 g/dL (ref 13.0–17.0)
Immature Granulocytes: 1 %
Lymphocytes Relative: 24 %
Lymphs Abs: 2.5 10*3/uL (ref 0.7–4.0)
MCH: 31.4 pg (ref 26.0–34.0)
MCHC: 33.1 g/dL (ref 30.0–36.0)
MCV: 95 fL (ref 80.0–100.0)
Monocytes Absolute: 1.2 10*3/uL — ABNORMAL HIGH (ref 0.1–1.0)
Monocytes Relative: 12 %
Neutro Abs: 6.1 10*3/uL (ref 1.7–7.7)
Neutrophils Relative %: 57 %
Platelets: 234 10*3/uL (ref 150–400)
RBC: 4.17 MIL/uL — ABNORMAL LOW (ref 4.22–5.81)
RDW: 15.2 % (ref 11.5–15.5)
WBC: 10.5 10*3/uL (ref 4.0–10.5)
nRBC: 0 % (ref 0.0–0.2)

## 2021-09-29 LAB — COMPREHENSIVE METABOLIC PANEL
ALT: 19 U/L (ref 0–44)
AST: 23 U/L (ref 15–41)
Albumin: 3.9 g/dL (ref 3.5–5.0)
Alkaline Phosphatase: 81 U/L (ref 38–126)
Anion gap: 10 (ref 5–15)
BUN: 29 mg/dL — ABNORMAL HIGH (ref 8–23)
CO2: 25 mmol/L (ref 22–32)
Calcium: 9.1 mg/dL (ref 8.9–10.3)
Chloride: 100 mmol/L (ref 98–111)
Creatinine, Ser: 1.33 mg/dL — ABNORMAL HIGH (ref 0.61–1.24)
GFR, Estimated: 52 mL/min — ABNORMAL LOW (ref >60)
Glucose, Bld: 99 mg/dL (ref 70–99)
Potassium: 4.4 mmol/L (ref 3.5–5.1)
Sodium: 135 mmol/L (ref 135–145)
Total Bilirubin: 0.5 mg/dL (ref 0.3–1.2)
Total Protein: 6.3 g/dL — ABNORMAL LOW (ref 6.5–8.1)

## 2021-09-29 LAB — URINE CULTURE

## 2021-09-29 LAB — MAGNESIUM: Magnesium: 2.4 mg/dL (ref 1.7–2.4)

## 2021-09-29 LAB — PHOSPHORUS: Phosphorus: 3.9 mg/dL (ref 2.5–4.6)

## 2021-09-29 NOTE — Progress Notes (Addendum)
OT Cancellation Note  Patient Details Name: [name] MRN: [id number] DOB: October 04, 1933   Cancelled Treatment:    Reason Eval/Treat Not Completed: Patient at procedure or test/ unavailable;Other (comment) (MRI) pt out of room at MRI, will check back as time allows for OT session.  Returned at 1500 with pt greeted in recliner, pt declines OT session d/t fatigue. Encouraged importance of continued mobility with pt politely declining.  Will continue efforts as time allows.  Harley Alto., COTA/L Acute Rehabilitation Services [phone]   Precious Haws 09/29/2021, 2:22 PM

## 2021-09-29 NOTE — Discharge Summary (Signed)
Physician Discharge Summary  [name] UJW:[id number] DOB: 1933/05/31 DOA: 09/26/2021  PCP: Daniel Frizzle, MD  Admit date: 09/26/2021 Discharge date: 09/29/2021  Time spent: 40 minutes  Recommendations for Outpatient Follow-up:  Follow outpatient CBC/CMP  Follow with neurology outpatient Follow with [name] outpatient regarding anticoagulation - of note, xarelto effectiveness maybe reduced by tegretol, follow outpatient and discuss with PCP  Follow BP outpatient   Discharge Diagnoses:  Principal Problem:   Right sided numbness Active Problems:   HTN (hypertension)   History of cardioembolic cerebrovascular accident (CVA)   Paroxysmal atrial flutter (HCC)   CAD S/P percutaneous coronary angioplasty   Dyslipidemia   Hypothyroidism   Controlled type 2 diabetes mellitus with complication, without long-term current use of insulin (HCC)   Seizure disorder (HCC)   Chronic diastolic HF (heart failure) ([place])   Discharge Condition: stable  Diet recommendation: heart healthy, diabetic  Filed Weights   09/27/21 1822  Weight: 76.5 kg    History of present illness:  JOHNATTAN [name] is [name] 88 y.o. male with medical history significant for paroxysmal atrial flutter on Xarelto, CAD s/p DES, CHB s/p PPM, history of CVA with mild residual right sided numbness, seizure disorder, T2DM, HTN, HLD, BPH who is admitted with acute on chronic right-sided numbness.  MRI negative for acute stroke.  Symptoms thought to be recrudescence of his prior stroke symptoms in the setting of significant hypertension.  See below for additional details  Hospital Course:  Assessment and Plan: * Right sided numbness History of cardiomyopathy CVA with residual right-sided numbness Patient reports worsening right-sided numbness compared to his baseline over the last 2 days.   MRI brain without acute abnormality (did have remote infarcts in L thalamus, L occipital lobe, and tiny remote infarcts in the  bilateral basal gangliar, stable mild chronic microvascular ischemic changes of the white matter, moderate parenchymal volume loss) Suspect sx may have been related to recrudescence of prior stroke symptoms in setting of elevated BP Previous stroke was cardioembolic after cardiac cath in 2014. CT head negative for acute changes, old bilateral basal ganglia and thalamic lunar infarcts seen Continue Xarelto Home health arranged Follow outpatient with neurology, he's back to his baseline  HTN (hypertension) Resume home antihypertensives  Paroxysmal atrial flutter (HCC) CHB s/p PPM In AV paced rhythm on admission.  Continue Xarelto. Question regarding ideal anticoagulant as he's on tegretol (looks like he was transitioned to xarelto from coumadin with concerns for ability to be adherent with mild cognitive issues) -> will continue xarelto at this time, defer anticoagulation questions outpatient.  Patient prefers to remain on xarelto notably.  Follow with [name] outpatient.  CAD S/P percutaneous coronary angioplasty Denies any chest pain.  Not on antiplatelets since he is on Xarelto.  Resume Imdur and atorvastatin.  Dyslipidemia Continue atorvastatin.  Hypothyroidism Continue Synthroid.  Controlled type 2 diabetes mellitus with complication, without long-term current use of insulin (HCC) Diet controlled, last A1c 5.8%.  Continue to monitor.  Chronic diastolic HF (heart failure) (HCC) Euvolemic on admission.  Continue home Lasix 10 mg daily.  Seizure disorder (HCC) Continue carbamazepine and Keppra.       Procedures: none   Consultations: none  Discharge Exam: Vitals:   09/29/21 1217 09/29/21 1540  BP: 129/66 139/78  Pulse: 69 64  Resp: 19 20  Temp: (!) 97.4 F (36.3 C) 97.7 F (36.5 C)  SpO2: 99% 98%   Feels back to normal No complaints Eager to get home Discussed with  daughter  General: No acute distress. Cardiovascular: RRR Lungs:  unlabored Neurological: Alert and oriented 3. Moves all extremities 4 with equal strength. Cranial nerves II through XII grossly intact. Extremities: No clubbing or cyanosis. No edema  Discharge Instructions   Discharge Instructions     Ambulatory referral to Neurology   Complete by: As directed    An appointment is requested in approximately: 4 weeks   Call MD for:  difficulty breathing, headache or visual disturbances   Complete by: As directed    Call MD for:  extreme fatigue   Complete by: As directed    Call MD for:  hives   Complete by: As directed    Call MD for:  persistant dizziness or light-headedness   Complete by: As directed    Call MD for:  persistant nausea and vomiting   Complete by: As directed    Call MD for:  redness, tenderness, or signs of infection (pain, swelling, redness, odor or green/yellow discharge around incision site)   Complete by: As directed    Call MD for:  severe uncontrolled pain   Complete by: As directed    Call MD for:  temperature >100.4   Complete by: As directed    Diet - low sodium heart healthy   Complete by: As directed    Discharge instructions   Complete by: As directed    You were seen with right sided numbness that was worse than your baseline.  This has resolved.  This occurred in the setting of high blood pressure and may have been the reappearance of stroke symptoms in the setting of your high blood pressure.  Your MRI did not show any acute stroke.  Follow with your neurologist as an outpatient.   Continue your blood pressure meds as prescribed.  Continue your xarelto and lipitor as prescribed.  Please follow up your xarelto with your PCP.  The combination of tegretol and xarelto can lead to decreased effectiveness of the xarelto.  It sounds like your PCP previously changed this, but I would recommend this be reviewed with your primary care doctor outpatient.  Return for new, recurrent, or worsening symptoms.  Please  ask your PCP to request records from this hospitalization so they know what was done and what the next steps will be.   Increase activity slowly   Complete by: As directed       Allergies as of 09/29/2021       Reactions   Penicillins    Did it involve swelling of the face/tongue/throat, SOB, or low BP? Unknown Did it involve sudden or severe rash/hives, skin peeling, or any reaction on the inside of your mouth or nose? Unknown Did you need to seek medical attention at [facility] or doctor's office? No When did it last happen?      Decades Ago If all above answers are "NO", may proceed with cephalosporin use.        Medication List     TAKE these medications    accu-chek safe-t pro lancets 1 each by Other route as needed. Use as instructed   acetaminophen 325 MG tablet Commonly known as: TYLENOL Take 1-2 tablets (325-650 mg total) by mouth every 4 (four) hours as needed for mild pain.   albuterol 108 (90 Base) MCG/ACT inhaler Commonly known as: VENTOLIN HFA Inhale 2 puffs into the lungs every 4 (four) hours as needed for wheezing or shortness of breath.   atorvastatin 80 MG tablet Commonly known as:  LIPITOR TAKE 1 TABLET BY MOUTH EVERY DAY AT 6 PM What changed: See the new instructions.   carbamazepine 300 MG 12 hr capsule Commonly known as: CARBATROL TAKE 1 CAPSULE(300 MG) BY MOUTH TWICE DAILY What changed: See the new instructions.   cloNIDine 0.2 MG tablet Commonly known as: CATAPRES TAKE 1 TABLET(0.2 MG) BY MOUTH TWICE DAILY What changed: See the new instructions.   donepezil 5 MG tablet Commonly known as: ARICEPT Take 1 tablet (5 mg total) by mouth at bedtime.   escitalopram 5 MG tablet Commonly known as: LEXAPRO TAKE 1 TABLET BY MOUTH EVERY DAY AT BEDTIME   furosemide 20 MG tablet Commonly known as: LASIX TAKE 1/2 TABLET(10 MG) BY MOUTH DAILY What changed: See the new instructions.   glucose blood test strip 1 each by Other route as needed. Use as  instructed   glucose monitoring kit monitoring kit 1 each by Does not apply route as needed.   HYDROcodone-acetaminophen 5-325 MG tablet Commonly known as: Norco Take 1 tablet by mouth every 6 (six) hours as needed for moderate pain.   isosorbide mononitrate 60 MG 24 hr tablet Commonly known as: IMDUR Take 0.5 tablets (30 mg total) by mouth daily.   levETIRAcetam 250 MG tablet Commonly known as: KEPPRA TAKE 1 TABLET(250 MG) BY MOUTH TWICE DAILY What changed: See the new instructions.   levothyroxine 88 MCG tablet Commonly known as: SYNTHROID Take 1 tablet (88 mcg total) by mouth daily.   losartan 100 MG tablet Commonly known as: COZAAR TAKE 1/2 TABLET BY MOUTH DAILY   multivitamins ther. w/minerals Tabs tablet Take 1 tablet by mouth at bedtime.   Myrbetriq 50 MG Tb24 tablet Generic drug: mirabegron ER Take 50 mg by mouth daily.   rivaroxaban 20 MG Tabs tablet Commonly known as: Xarelto Take 1 tablet (20 mg total) by mouth daily with supper.   tamsulosin 0.4 MG Caps capsule Commonly known as: FLOMAX TAKE 1 CAPSULE(0.4 MG) BY MOUTH DAILY AFTER AND SUPPER       Allergies  Allergen Reactions   Penicillins     Did it involve swelling of the face/tongue/throat, SOB, or low BP? Unknown Did it involve sudden or severe rash/hives, skin peeling, or any reaction on the inside of your mouth or nose? Unknown Did you need to seek medical attention at [facility] or doctor's office? No When did it last happen?      Decades Ago If all above answers are "NO", may proceed with cephalosporin use.      Follow-up Information     Care, [facility] Follow up.   Specialty: Home Health Services Why: The home health agency will contact you for the first home visit. Contact information: [address] [place] [address] [phone]                  The results of significant diagnostics from this hospitalization (including imaging, microbiology,  ancillary and laboratory) are listed below for reference.    Significant Diagnostic Studies: [name]  Result Date: 09/29/2021 CLINICAL DATA:  Neuro deficit, acute, stroke suspected. EXAM: MRI HEAD WITHOUT CONTRAST TECHNIQUE: Multiplanar, multiecho pulse sequences of the brain and surrounding structures were obtained without intravenous contrast. COMPARISON:  Head CT August 28, 2021; MRI of the brain June 30, 2020. FINDINGS: Brain: No acute infarction, hemorrhage, hydrocephalus, extra-axial collection or mass lesion. Remote infarcts in the left thalamus, left occipital lobe and bilateral basal ganglia. Scattered foci of T2 hyperintensity are  seen within the white matter of the cerebral hemispheres and within the pons, nonspecific, stable when compared to MRI performed in April 2022. Moderate parenchymal volume, also unchanged. Vascular: Normal flow voids. Skull and upper cervical spine: No focal marrow lesion identified. Thickening of retro odontoid soft tissue may be degenerative versus CPPD. Sinuses/Orbits: Bilateral lens surgery. Paranasal sinuses are clear. Postsurgical changes from right mastoidectomy. Other: None. IMPRESSION: 1. No acute intracranial abnormality. 2. Remote infarcts in the left thalamus, left occipital lobe and tiny remote infarcts in the bilateral basal ganglia. 3. Stable mild chronic microvascular ischemic changes of the white matter. 4. Moderate parenchymal volume loss, also unchanged. Electronically Signed   By: Pedro Earls M.D.   On: 09/29/2021 14:50   CT Head Wo Contrast  Result Date: 09/27/2021 CLINICAL DATA:  TIA, hypertension EXAM: CT HEAD WITHOUT CONTRAST TECHNIQUE: Contiguous axial images were obtained from the base of the skull through the vertex without intravenous contrast. RADIATION DOSE REDUCTION: This exam was performed according to the departmental dose-optimization program which includes automated exposure control, adjustment of the mA  and/or kV according to patient size and/or use of iterative reconstruction technique. COMPARISON:  07/08/2020 FINDINGS: Brain: Multiple old bilateral basal ganglia and thalamic lacunar infarcts. There is atrophy and chronic small vessel disease changes. No acute intracranial abnormality. Specifically, no hemorrhage, hydrocephalus, mass lesion, acute infarction, or significant intracranial injury. Vascular: No hyperdense vessel or unexpected calcification. Skull: No acute calvarial abnormality. Sinuses/Orbits: No acute findings Other: None IMPRESSION: Old bilateral basal ganglia and thalamic lacunar infarcts. Atrophy, chronic microvascular disease. No acute intracranial abnormality. Electronically Signed   By: Rolm Baptise M.D.   On: 09/27/2021 01:24   DG Chest Portable 1 View  Result Date: 09/27/2021 CLINICAL DATA:  Hypertension EXAM: PORTABLE CHEST 1 VIEW COMPARISON:  08/29/2020 FINDINGS: Cardiac shadow is mildly prominent but accentuated by the portable technique. Pacing device is again seen. The lungs are well aerated bilaterally. No focal infiltrate or sizable effusion is seen. No bony abnormality is noted. IMPRESSION: No acute abnormality noted. Electronically Signed   By: Inez Catalina M.D.   On: 09/27/2021 00:59    Microbiology: Recent Results (from the past 240 hour(s))  Urine Culture     Status: Abnormal   Collection Time: 09/27/21 12:27 AM   Specimen: Urine, Clean Catch  Result Value Ref Range Status   Specimen Description   Final    URINE, CLEAN CATCH Performed at [facility], [address], [place], [place] [address]    Special Requests   Final    NONE Performed at [facility], [address], [place], [place] [address]    Culture MULTIPLE SPECIES PRESENT, SUGGEST RECOLLECTION (Annel Zunker)  Final   Report Status 09/29/2021 FINAL  Final     Labs: Basic Metabolic Panel: Recent Labs  Lab 09/27/21 0035 09/28/21 0252 09/29/21 0133  NA 137 138 135   K 4.4 4.1 4.4  CL 101 103 100  CO2 26 25 25   GLUCOSE 90 100* 99  BUN 28* 23 29*  CREATININE 1.14 1.13 1.33*  CALCIUM 9.4 8.8* 9.1  MG  --   --  2.4  PHOS  --   --  3.9   Liver Function Tests: Recent Labs  Lab 09/27/21 0035 09/29/21 0133  AST 20 23  ALT 15 19  ALKPHOS 77 81  BILITOT 0.5 0.5  PROT 6.4* 6.3*  ALBUMIN 4.5 3.9   No results for input(s): "LIPASE", "AMYLASE" in the last 168  hours. No results for input(s): "AMMONIA" in the last 168 hours. CBC: Recent Labs  Lab 09/27/21 0035 09/28/21 0252 09/29/21 0133  WBC 7.5 8.3 10.5  NEUTROABS 3.8  --  6.1  HGB 11.8* 12.8* 13.1  HCT 35.4* 39.8 39.6  MCV 94.7 97.8 95.0  PLT 244 258 234   Cardiac Enzymes: No results for input(s): "CKTOTAL", "CKMB", "CKMBINDEX", "TROPONINI" in the last 168 hours. BNP: BNP (last 3 results) No results for input(s): "BNP" in the last 8760 hours.  ProBNP (last 3 results) No results for input(s): "PROBNP" in the last 8760 hours.  CBG: No results for input(s): "GLUCAP" in the last 168 hours.     Signed:  Fayrene Helper MD.  [facility] 09/29/2021, 4:45 PM

## 2021-09-29 NOTE — Progress Notes (Signed)
Informed of MRI for today.   Device system confirmed to be MRI conditional, with implant date > 6 weeks ago, and no evidence of abandoned or epicardial leads in review of most recent CXR Interrogation from today reviewed, pt is currently AS-VP at ~70 bpm Change device settings for MRI to DOO at 90 bpm  Tachy-therapies to off if applicable.  Program device back to pre-MRI settings after completion of exam.  Annamaria Helling  09/29/2021 1:29 PM

## 2021-09-29 NOTE — Progress Notes (Signed)
Patient discharge teaching given, including activity, diet, follow-up appoints, and medications. Patient verbalized understanding of all discharge instructions. IV access was d/c'd. Vitals are stable. Skin is intact except as charted in most recent assessments. Pt to be escorted out by NT, to be driven home by family. 

## 2021-09-30 ENCOUNTER — Telehealth: Payer: Self-pay

## 2021-09-30 ENCOUNTER — Ambulatory Visit (INDEPENDENT_AMBULATORY_CARE_PROVIDER_SITE_OTHER): Payer: Medicare Other

## 2021-09-30 DIAGNOSIS — I441 Atrioventricular block, second degree: Secondary | ICD-10-CM

## 2021-09-30 LAB — CUP PACEART REMOTE DEVICE CHECK
Battery Remaining Longevity: 143 mo
Battery Voltage: 3.04 V
Brady Statistic AP VP Percent: 16.72 %
Brady Statistic AP VS Percent: 0.01 %
Brady Statistic AS VP Percent: 81.85 %
Brady Statistic AS VS Percent: 1.42 %
Brady Statistic RA Percent Paced: 17.44 %
Brady Statistic RV Percent Paced: 98.57 %
Date Time Interrogation Session: 20230711131935
Implantable Lead Implant Date: 20220610
Implantable Lead Implant Date: 20220610
Implantable Lead Location: 753859
Implantable Lead Location: 753860
Implantable Lead Model: 3830
Implantable Lead Model: 5076
Implantable Pulse Generator Implant Date: 20220610
Lead Channel Impedance Value: 304 Ohm
Lead Channel Impedance Value: 342 Ohm
Lead Channel Impedance Value: 342 Ohm
Lead Channel Impedance Value: 532 Ohm
Lead Channel Pacing Threshold Amplitude: 0.625 V
Lead Channel Pacing Threshold Amplitude: 0.625 V
Lead Channel Pacing Threshold Pulse Width: 0.4 ms
Lead Channel Pacing Threshold Pulse Width: 0.4 ms
Lead Channel Sensing Intrinsic Amplitude: 1.875 mV
Lead Channel Sensing Intrinsic Amplitude: 1.875 mV
Lead Channel Sensing Intrinsic Amplitude: 12.875 mV
Lead Channel Sensing Intrinsic Amplitude: 12.875 mV
Lead Channel Setting Pacing Amplitude: 1.5 V
Lead Channel Setting Pacing Amplitude: 2 V
Lead Channel Setting Pacing Pulse Width: 0.4 ms
Lead Channel Setting Sensing Sensitivity: 1.2 mV

## 2021-09-30 NOTE — Telephone Encounter (Signed)
Transition Care Management Follow-up Telephone Call Date of discharge and from where: 09/29/2021. [facility] How have you been since you were released from the hospital? Pt states he is doing well. Any questions or concerns? No  Items Reviewed: Did the pt receive and understand the discharge instructions provided? Yes  Medications obtained and verified? Yes  Other? No  Any new allergies since your discharge? No  Dietary orders reviewed? Yes Do you have support at home? Yes   Home Care and Equipment/Supplies: Were home health services ordered? yes If so, what is the name of the agency? [facility]  Has the agency set up a time to come to the patient's home? no Were any new equipment or medical supplies ordered?  No What is the name of the medical supply agency? N/A Were you able to get the supplies/equipment? no Do you have any questions related to the use of the equipment or supplies? No  Functional Questionnaire: (I = Independent and D = Dependent) ADLs: I  Bathing/Dressing- I  Meal Prep- I  Eating- I  Maintaining continence- I  Transferring/Ambulation- I with walker.  Managing Meds- I  Follow up appointments reviewed:  PCP Hospital f/u appt confirmed? Yes  Scheduled to see [name] on 10/15/21 @ 12. [facility] f/u appt confirmed?  N/A   Are transportation arrangements needed? No  If their condition worsens, is the pt aware to call PCP or go to the Emergency Dept.? Yes Was the patient provided with contact information for the PCP's office or ED? Yes Was to pt encouraged to call back with questions or concerns? Yes

## 2021-10-08 ENCOUNTER — Telehealth: Payer: Self-pay | Admitting: Pharmacist

## 2021-10-08 NOTE — Progress Notes (Signed)
Chronic Care Management Pharmacy Assistant   Name: [name]  MRN: [id number] DOB: July 17, 1933  Reason for Encounter: Hypertension Adherence Call    Recent office visits:  None  Recent consult visits:  09/18/2021 OV (Cardiology) Minus Breeding, MD; no medication changes indicated.  Hospital visits:  09/26/2021 ED to Hospital Admission due to Stroke-like symptoms Admit date: 09/26/2021 Discharge date: 09/29/2021 -No medication changes  Medications: Outpatient Encounter Medications as of 10/08/2021  Medication Sig Note   acetaminophen (TYLENOL) 325 MG tablet Take 1-2 tablets (325-650 mg total) by mouth every 4 (four) hours as needed for mild pain.    albuterol (VENTOLIN HFA) 108 (90 Base) MCG/ACT inhaler Inhale 2 puffs into the lungs every 4 (four) hours as needed for wheezing or shortness of breath.    atorvastatin (LIPITOR) 80 MG tablet TAKE 1 TABLET BY MOUTH EVERY DAY AT 6 PM (Patient taking differently: Take 80 mg by mouth daily. TAKE 1 TABLET BY MOUTH EVERY DAY AT 6 PM)    carbamazepine (CARBATROL) 300 MG 12 hr capsule TAKE 1 CAPSULE(300 MG) BY MOUTH TWICE DAILY (Patient taking differently: Take 300 mg by mouth 2 (two) times daily.)    cloNIDine (CATAPRES) 0.2 MG tablet TAKE 1 TABLET(0.2 MG) BY MOUTH TWICE DAILY (Patient taking differently: Take 0.2 mg by mouth 2 (two) times daily.)    donepezil (ARICEPT) 5 MG tablet Take 1 tablet (5 mg total) by mouth at bedtime.    escitalopram (LEXAPRO) 5 MG tablet TAKE 1 TABLET BY MOUTH EVERY DAY AT BEDTIME    furosemide (LASIX) 20 MG tablet TAKE 1/2 TABLET(10 MG) BY MOUTH DAILY (Patient taking differently: Take 10 mg by mouth daily.)    glucose blood test strip 1 each by Other route as needed. Use as instructed (Patient not taking: Reported on 09/18/2021)    glucose monitoring kit (FREESTYLE) monitoring kit 1 each by Does not apply route as needed.    HYDROcodone-acetaminophen (NORCO) 5-325 MG tablet Take 1 tablet by mouth every 6 (six)  hours as needed for moderate pain. (Patient not taking: Reported on 09/28/2021)    isosorbide mononitrate (IMDUR) 60 MG 24 hr tablet Take 0.5 tablets (30 mg total) by mouth daily.    Lancets (ACCU-CHEK SAFE-T PRO) lancets 1 each by Other route as needed. Use as instructed    levETIRAcetam (KEPPRA) 250 MG tablet TAKE 1 TABLET(250 MG) BY MOUTH TWICE DAILY (Patient taking differently: Take 250 mg by mouth 2 (two) times daily.)    levothyroxine (SYNTHROID) 88 MCG tablet Take 1 tablet (88 mcg total) by mouth daily.    losartan (COZAAR) 100 MG tablet TAKE 1/2 TABLET BY MOUTH DAILY (Patient taking differently: Take 50 mg by mouth daily.)    Multiple Vitamins-Minerals (MULTIVITAMINS THER. W/MINERALS) TABS Take 1 tablet by mouth at bedtime.      MYRBETRIQ 50 MG TB24 tablet Take 50 mg by mouth daily. 09/28/2021: Hasn't started   rivaroxaban (XARELTO) 20 MG TABS tablet Take 1 tablet (20 mg total) by mouth daily with supper.    tamsulosin (FLOMAX) 0.4 MG CAPS capsule TAKE 1 CAPSULE(0.4 MG) BY MOUTH DAILY AFTER AND SUPPER    No facility-administered encounter medications on file as of 10/08/2021.   Contacted [facility] for General Review Call   Chart Review:  Have there been any documented new, changed, or discontinued medications since last visit? No (If yes, include name, dose, frequency, date) Has there been any documented recent hospitalizations or ED visits since last visit with  Clinical Pharmacist? Yes Brief Summary (including medication and/or Diagnosis changes):   Adherence Review:  Does the Clinical Pharmacist Assistant have access to adherence rates? Yes Adherence rates for STAR metric medications: Atorvastatin 80 mg last filled 07/22/2021 90 DS Losartan 100 mg last filled 09/17/2021 90 DS Does the patient have >5 day gap between last estimated fill dates for any of the above medications or other medication gaps? No Reason for medication gaps.   Disease State Questions:  Able to  connect with Patient? Yes Did patient have any problems with their health recently? No Have you had any admissions or emergency room visits or worsening of your condition(s) since last visit? Yes Details of ED visit, hospital visit and/or worsening condition(s): Patient was admitted on 09/29/2021 for stroke like symptoms. Patient reports doing much better since his hospital visit. Have you had any visits with new specialists or providers since your last visit? No Have you had any new health care problem(s) since your last visit? No Have you run out of any of your medications since you last spoke with clinical pharmacist? No Are there any medications you are not taking as prescribed? No Are you having any issues or side effects with your medications? No Do you have any other health concerns or questions you want to discuss with your Clinical Pharmacist before your next visit? No Are there any health concerns that you feel we can do a better job addressing? No Are you having any problems with any of the following since the last visit: (select all that apply)  None 12. Any falls since last visit? No 13. Any increased or uncontrolled pain since last visit? No  Patient declines to schedule a follow up telephone visit with clinical pharmacist.  Care Gaps: Medicare Annual Wellness: Completed 03/06/2021 Ophthalmology Exam: Overdue since 09/27/2018 Foot Exam: Overdue since 03/19/2020 Hemoglobin A1C: 5.8% on 05/08/2021 Colonoscopy: Completed 12/19/2013  Star Rating Drugs: Atorvastatin 80 mg last filled 07/22/2021 90 DS Losartan 100 mg last filled 09/17/2021 90 DS  April D Calhoun, [place] Pharmacist Assistant [phone]

## 2021-10-13 ENCOUNTER — Other Ambulatory Visit: Payer: Self-pay | Admitting: Family Medicine

## 2021-10-15 ENCOUNTER — Ambulatory Visit (INDEPENDENT_AMBULATORY_CARE_PROVIDER_SITE_OTHER): Payer: Medicare Other | Admitting: Family Medicine

## 2021-10-15 VITALS — BP 128/64 | HR 60 | Temp 97.9°F | Ht 68.0 in | Wt 180.0 lb

## 2021-10-15 DIAGNOSIS — I480 Paroxysmal atrial fibrillation: Secondary | ICD-10-CM | POA: Diagnosis not present

## 2021-10-15 DIAGNOSIS — E039 Hypothyroidism, unspecified: Secondary | ICD-10-CM | POA: Diagnosis not present

## 2021-10-15 DIAGNOSIS — Z7901 Long term (current) use of anticoagulants: Secondary | ICD-10-CM | POA: Diagnosis not present

## 2021-10-15 DIAGNOSIS — I10 Essential (primary) hypertension: Secondary | ICD-10-CM | POA: Diagnosis not present

## 2021-10-15 DIAGNOSIS — L989 Disorder of the skin and subcutaneous tissue, unspecified: Secondary | ICD-10-CM

## 2021-10-15 NOTE — Progress Notes (Signed)
Subjective:    Patient ID: [name], male    DOB: 09/17/1933, 88 y.o.   MRN: [id number]   Admit date: 09/26/2021 Discharge date: 09/29/2021   Time spent: 40 minutes   Recommendations for Outpatient Follow-up:  Follow outpatient CBC/CMP  Follow with neurology outpatient Follow with [name] outpatient regarding anticoagulation - of note, xarelto effectiveness maybe reduced by tegretol, follow outpatient and discuss with PCP  Follow BP outpatient    Discharge Diagnoses:  Principal Problem:   Right sided numbness Active Problems:   HTN (hypertension)   History of cardioembolic cerebrovascular accident (CVA)   Paroxysmal atrial flutter (HCC)   CAD S/P percutaneous coronary angioplasty   Dyslipidemia   Hypothyroidism   Controlled type 2 diabetes mellitus with complication, without long-term current use of insulin (HCC)   Seizure disorder (HCC)   Chronic diastolic HF (heart failure) ([place])     Discharge Condition: stable   Diet recommendation: heart healthy, diabetic      Filed Weights    09/27/21 1822  Weight: 76.5 kg      History of present illness:  [name] is a 88 y.o. male with medical history significant for paroxysmal atrial flutter on Xarelto, CAD s/p DES, CHB s/p PPM, history of CVA with mild residual right sided numbness, seizure disorder, T2DM, HTN, HLD, BPH who is admitted with acute on chronic right-sided numbness.   MRI negative for acute stroke.  Symptoms thought to be recrudescence of his prior stroke symptoms in the setting of significant hypertension.   See below for additional details   Hospital Course:  Assessment and Plan: * Right sided numbness History of cardiomyopathy CVA with residual right-sided numbness Patient reports worsening right-sided numbness compared to his baseline over the last 2 days.   MRI brain without acute abnormality (did have remote infarcts in L thalamus, L occipital lobe, and tiny remote infarcts in the  bilateral basal gangliar, stable mild chronic microvascular ischemic changes of the white matter, moderate parenchymal volume loss) Suspect sx may have been related to recrudescence of prior stroke symptoms in setting of elevated BP Previous stroke was cardioembolic after cardiac cath in 2014. CT head negative for acute changes, old bilateral basal ganglia and thalamic lunar infarcts seen Continue Xarelto Home health arranged Follow outpatient with neurology, he's back to his baseline   HTN (hypertension) Resume home antihypertensives   Paroxysmal atrial flutter (HCC) CHB s/p PPM In AV paced rhythm on admission.  Continue Xarelto. Question regarding ideal anticoagulant as he's on tegretol (looks like he was transitioned to xarelto from coumadin with concerns for ability to be adherent with mild cognitive issues) -> will continue xarelto at this time, defer anticoagulation questions outpatient.  Patient prefers to remain on xarelto notably.  Follow with [name] outpatient.   CAD S/P percutaneous coronary angioplasty Denies any chest pain.  Not on antiplatelets since he is on Xarelto.  Resume Imdur and atorvastatin.   Dyslipidemia Continue atorvastatin.   Hypothyroidism Continue Synthroid.   Controlled type 2 diabetes mellitus with complication, without long-term current use of insulin (HCC) Diet controlled, last A1c 5.8%.  Continue to monitor.   Chronic diastolic HF (heart failure) (HCC) Euvolemic on admission.  Continue home Lasix 10 mg daily.   Seizure disorder (HCC) Continue carbamazepine and Keppra.  10/15/21 Patient is concerned because his blood pressure has been fluctuating dramatically.  Over the last week he has checked his blood pressure 6 times.  The lowest reading is 112/48.  The  highest reading is 172/72.  Sometimes will be excellent 122/55.  The very next time he checks it it will be high 172/72.  Patient is concerned because his blood pressure does not seem to be  following any consistent pattern.  He is not checking it with activity.  He is not checking it when he is nervous.  He is not checking it after exercise.  Of note, he also has a lesion at the angle of his left mandible.  Is roughly 6 to 7 mm in diameter.  It has raised rolled edges with a central ulcer.  It appears to be a basal cell cancer.  He states has been there for 3 or 4 months   Past Medical History:  Diagnosis Date   Atrial flutter ([place])    BPH (benign prostatic hyperplasia)    Coronary artery disease    a. s/p MI tx with Cypher DES to pRCA in 4/04;  b. Echocardiogram 7/09: Normal LV function.  c. Nuclear study 3/13 no ischemia;  d. ETT 2/14 neg;  e. admx with CP => LHC (01/30/2013):  pLAD 40-50%, oD1 70-80%, oOM1 40%, pRCA stent patent, mid RCA 30%, EF 60-65%. => med Rx.   DDD (degenerative disc disease)    Diabetes mellitus without complication ([place])    Dyslipidemia    Gait disorder 07/13/2018   Hemorrhoids    Hx MRSA infection    left buttocks abscess   Hx of echocardiogram    a. Echocardiogram (01/31/2013): Mild focal basal and mild concentric hypertrophy of the septum, EF 50-55%, normal wall motion, grade 1 diastolic dysfunction, trivial AI, MAC, mild LAE, PASP 35   Hyperlipidemia    Hypertension    Hypothyroidism    Meniere's disease    Status post shunt   Myocardial infarction [facility]) 2008   Occlusion and stenosis of carotid artery without mention of cerebral infarction    40-59% on carotid doppler 2014; [place] (01/2013): R 1-39%, L 60-79%   Rotator cuff injury    chronic rotator cuff injury status post repair   Seizure disorder ([place])    Stroke ([place])    a. 01/2013=> post cardiac cath CVA to L post communicating artery system; R sided weakness   Syncope    Past Surgical History:  Procedure Laterality Date   COLONOSCOPY     CORONARY ANGIOPLASTY WITH STENT PLACEMENT  07/14/2002   Stent to the right coronary artery   ENDOLYMPHATIC SHUNT DECOMPRESSION  06/11/2009    Right  endolymphatic sac decompression and shunt  placement   HERNIA REPAIR  04/10/2008   scrotal hernia repair   LEFT HEART CATHETERIZATION WITH CORONARY ANGIOGRAM N/A 01/30/2013   Procedure: LEFT HEART CATHETERIZATION WITH CORONARY ANGIOGRAM;  Surgeon: Larey Dresser, MD;  Location: [facility] CATH LAB;  Service: Cardiovascular;  Laterality: N/A;   PACEMAKER IMPLANT N/A 08/29/2020   Procedure: PACEMAKER IMPLANT;  Surgeon: [name], MD;  Location: [facility] CV LAB;  Service: Cardiovascular;  Laterality: N/A;   ROTATOR CUFF REPAIR     for chronic rotator cuff injury   Current Outpatient Medications on File Prior to Visit  Medication Sig Dispense Refill   acetaminophen (TYLENOL) 325 MG tablet Take 1-2 tablets (325-650 mg total) by mouth every 4 (four) hours as needed for mild pain.     albuterol (VENTOLIN HFA) 108 (90 Base) MCG/ACT inhaler Inhale 2 puffs into the lungs every 4 (four) hours as needed for wheezing or shortness of breath. 18 g 11   atorvastatin (LIPITOR)  80 MG tablet TAKE 1 TABLET BY MOUTH EVERY DAY AT 6 PM (Patient taking differently: Take 80 mg by mouth daily. TAKE 1 TABLET BY MOUTH EVERY DAY AT 6 PM) 90 tablet 3   carbamazepine (CARBATROL) 300 MG 12 hr capsule TAKE 1 CAPSULE(300 MG) BY MOUTH TWICE DAILY (Patient taking differently: Take 300 mg by mouth 2 (two) times daily.) 180 capsule 3   cloNIDine (CATAPRES) 0.2 MG tablet TAKE 1 TABLET(0.2 MG) BY MOUTH TWICE DAILY (Patient taking differently: Take 0.2 mg by mouth 2 (two) times daily.) 180 tablet 3   donepezil (ARICEPT) 5 MG tablet Take 1 tablet (5 mg total) by mouth at bedtime. 30 tablet 2   escitalopram (LEXAPRO) 5 MG tablet TAKE 1 TABLET BY MOUTH EVERY DAY AT BEDTIME 90 tablet 0   furosemide (LASIX) 20 MG tablet TAKE 1/2 TABLET(10 MG) BY MOUTH DAILY (Patient taking differently: Take 10 mg by mouth daily.) 45 tablet 0   glucose blood test strip 1 each by Other route as needed. Use as instructed (Patient not taking: Reported on  09/18/2021) 100 each 3   glucose monitoring kit (FREESTYLE) monitoring kit 1 each by Does not apply route as needed. 1 each 0   HYDROcodone-acetaminophen (NORCO) 5-325 MG tablet Take 1 tablet by mouth every 6 (six) hours as needed for moderate pain. (Patient not taking: Reported on 09/28/2021) 15 tablet 0   isosorbide mononitrate (IMDUR) 60 MG 24 hr tablet TAKE 1/2 TABLET BY MOUTH EVERY DAY 90 tablet 3   Lancets (ACCU-CHEK SAFE-T PRO) lancets 1 each by Other route as needed. Use as instructed 100 each 3   levETIRAcetam (KEPPRA) 250 MG tablet TAKE 1 TABLET(250 MG) BY MOUTH TWICE DAILY (Patient taking differently: Take 250 mg by mouth 2 (two) times daily.) 180 tablet 1   levothyroxine (SYNTHROID) 88 MCG tablet Take 1 tablet (88 mcg total) by mouth daily. 90 tablet 3   losartan (COZAAR) 100 MG tablet TAKE 1/2 TABLET BY MOUTH DAILY (Patient taking differently: Take 50 mg by mouth daily.) 90 tablet 1   Multiple Vitamins-Minerals (MULTIVITAMINS THER. W/MINERALS) TABS Take 1 tablet by mouth at bedtime.       MYRBETRIQ 50 MG TB24 tablet Take 50 mg by mouth daily.     rivaroxaban (XARELTO) 20 MG TABS tablet Take 1 tablet (20 mg total) by mouth daily with supper. 90 tablet 3   tamsulosin (FLOMAX) 0.4 MG CAPS capsule TAKE 1 CAPSULE(0.4 MG) BY MOUTH DAILY AFTER AND SUPPER 90 capsule 3   No current facility-administered medications on file prior to visit.   Allergies  Allergen Reactions   Penicillins     Did it involve swelling of the face/tongue/throat, SOB, or low BP? Unknown Did it involve sudden or severe rash/hives, skin peeling, or any reaction on the inside of your mouth or nose? Unknown Did you need to seek medical attention at a hospital or doctor's office? No When did it last happen?      Decades Ago If all above answers are "NO", may proceed with cephalosporin use.     Social History   Socioeconomic History   Marital status: Married    Spouse name: Not on file   Number of children: 4    Years of education: 12th   Highest education level: Not on file  Occupational History   Occupation: Retired  Tobacco Use   Smoking status: Former    Years: 5.00    Types: Cigarettes    Quit date: 08/19/1956  Years since quitting: 65.2   Smokeless tobacco: Former    Types: Nurse, children's Use: Never used  Substance and Sexual Activity   Alcohol use: No   Drug use: No   Sexual activity: Never  Other Topics Concern   Not on file  Social History Narrative   Lives with wife.   Children live nearby and help with care.    Social Determinants of Health   Financial Resource Strain: Low Risk  (03/06/2021)   Overall Financial Resource Strain (CARDIA)    Difficulty of Paying Living Expenses: Not hard at all  Food Insecurity: No Food Insecurity (03/06/2021)   Hunger Vital Sign    Worried About Running Out of Food in the Last Year: Never true    Ran Out of Food in the Last Year: Never true  Transportation Needs: No Transportation Needs (03/06/2021)   PRAPARE - Hydrologist (Medical): No    Lack of Transportation (Non-Medical): No  Physical Activity: Insufficiently Active (03/06/2021)   Exercise Vital Sign    Days of Exercise per Week: 3 days    Minutes of Exercise per Session: 20 min  Stress: No Stress Concern Present (03/06/2021)   [place]    Feeling of Stress : Not at all  Social Connections: [place] (03/06/2021)   Social Connection and Isolation Panel [NHANES]    Frequency of Communication with Friends and Family: More than three times a week    Frequency of Social Gatherings with Friends and Family: More than three times a week    Attends Religious Services: 1 to 4 times per year    Active Member of [employer] or Organizations: Yes    Attends Archivist Meetings: 1 to 4 times per year    Marital Status: Married  Human resources officer Violence: Not At Risk  (03/06/2021)   Humiliation, Afraid, Rape, and Kick questionnaire    Fear of Current or Ex-Partner: No    Emotionally Abused: No    Physically Abused: No    Sexually Abused: No     Review of Systems  All other systems reviewed and are negative.      Objective:   Physical Exam Vitals reviewed.  Constitutional:      General: He is not in acute distress.    Appearance: Normal appearance. He is obese. He is not ill-appearing, toxic-appearing or diaphoretic.  Neck:     Vascular: No carotid bruit.  Cardiovascular:     Rate and Rhythm: Normal rate and regular rhythm.     Heart sounds: Normal heart sounds.     No friction rub. No gallop.  Pulmonary:     Effort: Pulmonary effort is normal. No respiratory distress.     Breath sounds: Normal breath sounds. No stridor. No wheezing, rhonchi or rales.  Musculoskeletal:     Right lower leg: No edema.     Left lower leg: No edema.  Lymphadenopathy:     Cervical: No cervical adenopathy.  Neurological:     Mental Status: He is alert.          Assessment & Plan:  Paroxysmal atrial fibrillation ([place]) - Plan: CBC with Differential/Platelet, COMPLETE METABOLIC PANEL WITH GFR, TSH  Essential hypertension - Plan: CBC with Differential/Platelet, COMPLETE METABOLIC PANEL WITH GFR, TSH  Hypothyroidism, unspecified type - Plan: CBC with Differential/Platelet, COMPLETE METABOLIC PANEL WITH GFR, TSH  Chronic anticoagulation  Skin lesion of face -  Plan: Ambulatory referral to Dermatology I think his blood pressure is fluctuating due to doses associated with age help along with the fact he is on clonidine.  He is taking clonidine twice a day and I do not feel it is giving him consistent 24-hour day coverage.  Therefore I want him to increase losartan to 100 mg and decrease clonidine to 0.1 mg twice a day.  I will recheck the patient's blood pressure here in a week.  My plan is to transition the patient gradually off clonidine and replace with  amlodipine to try to give better 24 hours a day, 7 days a week coverage.  Reassess in 1 week.  Consult dermatology regarding the lesion on the left side of his face at the angle of the mandible appears to be a basal cell cancer.

## 2021-10-20 NOTE — Progress Notes (Signed)
Remote pacemaker transmission.   

## 2021-10-21 ENCOUNTER — Other Ambulatory Visit: Payer: Self-pay | Admitting: Family Medicine

## 2021-10-22 ENCOUNTER — Ambulatory Visit (INDEPENDENT_AMBULATORY_CARE_PROVIDER_SITE_OTHER): Payer: Medicare Other | Admitting: Family Medicine

## 2021-10-22 VITALS — BP 160/60 | HR 60 | Temp 97.5°F | Ht 68.0 in | Wt 179.0 lb

## 2021-10-22 DIAGNOSIS — I10 Essential (primary) hypertension: Secondary | ICD-10-CM | POA: Diagnosis not present

## 2021-10-22 MED ORDER — AMLODIPINE BESYLATE 10 MG PO TABS: 10.0000 mg | ORAL_TABLET | Freq: Every day | ORAL | 3 refills | Status: DC

## 2021-10-22 NOTE — Telephone Encounter (Signed)
Requested Prescriptions  Pending Prescriptions Disp Refills  . donepezil (ARICEPT) 5 MG tablet [Pharmacy Med Name: DONEPEZIL '5MG'$  TABLETS] 90 tablet 0    Sig: TAKE 1 TABLET(5 MG) BY MOUTH AT BEDTIME     Neurology:  Alzheimer's Agents Passed - 10/21/2021 11:04 AM      Passed - Valid encounter within last 6 months    Recent Outpatient Visits          5 months ago Essential hypertension   [place], Warren T, MD   9 months ago Hematuria, unspecified type   [place] [name], MD   1 year ago Paroxysmal atrial fibrillation [facility])   [place] [name], MD   1 year ago Paroxysmal atrial fibrillation [facility])   [place] Pickard, Cammie Mcgee, MD   1 year ago Herpes zoster without complication   [place] Eulogio Bear, NP

## 2021-10-22 NOTE — Progress Notes (Signed)
Chronic Care Management Pharmacy Note  10/28/2021 Name:  [name] MRN:  [id number] DOB:  May 04, 1933  Summary: PharmD FU visit.  Patient having BP fluctuations, recent med changed.  He is tolerating well BP was recently 845X systolic.  Has pending labs for TSH  Recommendations/Changes made from today's visit: Recommend recheck TSH to make sure dosed correctly FU with PCP if BP remains elevated  Plan: FU with me in 6 months CMA BP check in 30 days   [name] who is a primary patient of Pickard, Cammie Mcgee, MD.  The CCM team was consulted for assistance with disease management and care coordination needs.    Engaged with patient by telephone for follow up visit in response to provider referral for pharmacy case management and/or care coordination services.   Consent to Services:  The patient was given the following information about Chronic Care Management services today, agreed to services, and gave verbal consent: 1. CCM service includes personalized support from designated clinical staff supervised by the primary care provider, including individualized plan of care and coordination with other care providers 2. 24/7 contact phone numbers for assistance for urgent and routine care needs. 3. Service will only be billed when office clinical staff spend 20 minutes or more in a month to coordinate care. 4. Only one practitioner may furnish and bill the service in a calendar month. 5.The patient may stop CCM services at any time (effective at the end of the month) by phone call to the office staff. 6. The patient will be responsible for cost sharing (co-pay) of up to 20% of the service fee (after annual deductible is met). Patient agreed to services and consent obtained.  Patient Care Team: [name], MD as PCP - General (Family Medicine) Minus Breeding, MD as PCP - Cardiology (Cardiology) Edythe Clarity, [facility] as Pharmacist  (Pharmacist)  Recent office visits:  None   Recent consult visits:  09/18/2021 OV (Cardiology) Minus Breeding, MD; no medication changes indicated.   Hospital visits:  09/26/2021 ED to Hospital Admission due to Stroke-like symptoms Admit date: 09/26/2021 Discharge date: 09/29/2021 -No medication changes  Objective:  Lab Results  Component Value Date   CREATININE 1.33 (H) 09/29/2021   BUN 29 (H) 09/29/2021   EGFR 60 05/08/2021   GFRNONAA 52 (L) 09/29/2021   GFRAA 76 07/15/2020   NA 135 09/29/2021   K 4.4 09/29/2021   CALCIUM 9.1 09/29/2021   CO2 25 09/29/2021   GLUCOSE 99 09/29/2021    Lab Results  Component Value Date/Time   HGBA1C 5.8 (H) 05/08/2021 11:12 AM   HGBA1C 6.3 (H) 06/30/2020 08:38 AM   MICROALBUR 1.1 07/24/2015 09:27 AM   MICROALBUR 0.5 11/15/2014 09:15 AM    Last diabetic Eye exam:  Lab Results  Component Value Date/Time   HMDIABEYEEXA No Retinopathy 09/26/2017 12:00 AM    Last diabetic Foot exam: No results found for: "HMDIABFOOTEX"   Lab Results  Component Value Date   CHOL 193 05/08/2021   HDL 78 05/08/2021   LDLCALC 99 05/08/2021   TRIG 75 05/08/2021   CHOLHDL 2.5 05/08/2021       Latest Ref Rng & Units 09/29/2021    1:33 AM 09/27/2021   12:35 AM 05/08/2021   11:12 AM  Hepatic Function  Total Protein 6.5 - 8.1 g/dL 6.3  6.4  6.2   Albumin 3.5 - 5.0 g/dL 3.9  4.5    AST 15 -  41 U/L 23  20  18    ALT 0 - 44 U/L 19  15  13    Alk Phosphatase 38 - 126 U/L 81  77    Total Bilirubin 0.3 - 1.2 mg/dL 0.5  0.5  0.4     Lab Results  Component Value Date/Time   TSH 8.39 (H) 05/08/2021 11:12 AM   TSH 3.41 07/15/2020 03:32 PM       Latest Ref Rng & Units 09/29/2021    1:33 AM 09/28/2021    2:52 AM 09/27/2021   12:35 AM  CBC  WBC 4.0 - 10.5 K/uL 10.5  8.3  7.5   Hemoglobin 13.0 - 17.0 g/dL 13.1  12.8  11.8   Hematocrit 39.0 - 52.0 % 39.6  39.8  35.4   Platelets 150 - 400 K/uL 234  258  244     No results found for: "VD25OH"  Clinical ASCVD:  Yes  The ASCVD Risk score (Arnett DK, et al., 2019) failed to calculate for the following reasons:   The 2019 ASCVD risk score is only valid for ages 33 to 27   The patient has a prior MI or stroke diagnosis       03/06/2021   12:43 PM 07/19/2018    1:18 PM 02/13/2018   10:16 AM  Depression screen PHQ 2/9  Decreased Interest 0 0 0  Down, Depressed, Hopeless 0  0  PHQ - 2 Score 0 0 0      Social History   Tobacco Use  Smoking Status Former   Years: 5.00   Types: Cigarettes   Quit date: 08/19/1956   Years since quitting: 65.2  Smokeless Tobacco Former   Types: Chew   BP Readings from Last 3 Encounters:  10/22/21 (!) 160/60  10/15/21 128/64  09/29/21 139/78   Pulse Readings from Last 3 Encounters:  10/22/21 60  10/15/21 60  09/29/21 64   Wt Readings from Last 3 Encounters:  10/22/21 179 lb (81.2 kg)  10/15/21 180 lb (81.6 kg)  09/27/21 168 lb 10.4 oz (76.5 kg)   BMI Readings from Last 3 Encounters:  10/22/21 27.22 kg/m  10/15/21 27.37 kg/m  09/27/21 25.27 kg/m    Assessment/Interventions: Review of patient past medical history, allergies, medications, health status, including review of consultants reports, laboratory and other test data, was performed as part of comprehensive evaluation and provision of chronic care management services.   SDOH:  (Social Determinants of Health) assessments and interventions performed: No, assessed within last year  Financial Resource Strain: Low Risk  (03/06/2021)   Overall Financial Resource Strain (CARDIA)    Difficulty of Paying Living Expenses: Not hard at all    SDOH Screenings   Alcohol Screen: Low Risk  (03/06/2021)   Alcohol Screen    Last Alcohol Screening Score (AUDIT): 0  Depression (PHQ2-9): Low Risk  (03/06/2021)   Depression (PHQ2-9)    PHQ-2 Score: 0  Financial Resource Strain: Low Risk  (03/06/2021)   Overall Financial Resource Strain (CARDIA)    Difficulty of Paying Living Expenses: Not hard at all   Food Insecurity: No Food Insecurity (03/06/2021)   Hunger Vital Sign    Worried About Running Out of Food in the Last Year: Never true    Ran Out of Food in the Last Year: Never true  Housing: Low Risk  (03/06/2021)   Housing    Last Housing Risk Score: 0  Physical Activity: Insufficiently Active (03/06/2021)   Exercise Vital Sign  Days of Exercise per Week: 3 days    Minutes of Exercise per Session: 20 min  Social Connections: Socially Integrated (03/06/2021)   Social Connection and Isolation Panel [NHANES]    Frequency of Communication with Friends and Family: More than three times a week    Frequency of Social Gatherings with Friends and Family: More than three times a week    Attends Religious Services: 1 to 4 times per year    Active Member of [employer] or Organizations: Yes    Attends Archivist Meetings: 1 to 4 times per year    Marital Status: Married  Stress: No Stress Concern Present (03/06/2021)   [place]    Feeling of Stress : Not at all  Tobacco Use: Medium Risk (09/27/2021)   Patient History    Smoking Tobacco Use: Former    Smokeless Tobacco Use: Former    Passive Exposure: Not on Pensions consultant Needs: No Transportation Needs (03/06/2021)   PRAPARE - Hydrologist (Medical): No    Lack of Transportation (Non-Medical): No    CCM Care Plan  Allergies  Allergen Reactions   Penicillins     Did it involve swelling of the face/tongue/throat, SOB, or low BP? Unknown Did it involve sudden or severe rash/hives, skin peeling, or any reaction on the inside of your mouth or nose? Unknown Did you need to seek medical attention at a hospital or doctor's office? No When did it last happen?      Decades Ago If all above answers are "NO", may proceed with cephalosporin use.      Medications Reviewed Today     Reviewed by Edythe Clarity, [facility] (Pharmacist) on  10/28/21 at 1114  Med List Status: <None>   Medication Order Taking? Sig Documenting Provider Last Dose Status Informant  acetaminophen (TYLENOL) 325 MG tablet [id number] Yes Take 1-2 tablets (325-650 mg total) by mouth every 4 (four) hours as needed for mild pain. Bary Leriche, PA-C Taking Active Self  albuterol (VENTOLIN HFA) 108 (90 Base) MCG/ACT inhaler [id number] Yes Inhale 2 puffs into the lungs every 4 (four) hours as needed for wheezing or shortness of breath. [name], MD Taking Active Self  amLODipine (NORVASC) 10 MG tablet [id number] Yes Take 1 tablet (10 mg total) by mouth daily. [name], MD Taking Active   atorvastatin (LIPITOR) 80 MG tablet [id number] Yes TAKE 1 TABLET BY MOUTH EVERY DAY AT 6 PM  Patient taking differently: Take 80 mg by mouth daily. TAKE 1 TABLET BY MOUTH EVERY DAY AT 6 PM   Pickard, Cammie Mcgee, MD Taking Active Self  carbamazepine (CARBATROL) 300 MG 12 hr capsule [id number] Yes TAKE 1 CAPSULE(300 MG) BY MOUTH TWICE DAILY  Patient taking differently: Take 300 mg by mouth 2 (two) times daily.   [name], MD Taking Active Self  cloNIDine (CATAPRES) 0.2 MG tablet [id number] Yes TAKE 1 TABLET(0.2 MG) BY MOUTH TWICE DAILY  Patient taking differently: Take 0.1 mg by mouth 2 (two) times daily.   [name], MD Taking Active Self  donepezil (ARICEPT) 5 MG tablet [id number] Yes TAKE 1 TABLET(5 MG) BY MOUTH AT BEDTIME [name], MD Taking Active   escitalopram (LEXAPRO) 5 MG tablet [id number] Yes TAKE 1 TABLET BY MOUTH EVERY DAY AT BEDTIME [name], MD Taking Active Self  furosemide (LASIX) 20 MG tablet [id number] Yes TAKE 1/2 TABLET(10 MG) BY  MOUTH DAILY  Patient taking differently: Take 10 mg by mouth daily.   [name], MD Taking Active Self  glucose blood test strip [id number] Yes 1 each by Other route as needed. Use as instructed Buelah Manis, Modena Nunnery, MD Taking Active Self  glucose monitoring kit (FREESTYLE) monitoring kit  [id number] Yes 1 each by Does not apply route as needed. Alycia Rossetti, MD Taking Active Self  HYDROcodone-acetaminophen [facility]) 5-325 MG tablet [id number] Yes Take 1 tablet by mouth every 6 (six) hours as needed for moderate pain. [name], MD Taking Active Self  isosorbide mononitrate (IMDUR) 60 MG 24 hr tablet [id number] Yes TAKE 1/2 TABLET BY MOUTH EVERY DAY [name], MD Taking Active   Lancets (ACCU-CHEK SAFE-T PRO) lancets [id number] Yes 1 each by Other route as needed. Use as instructed Buelah Manis, Modena Nunnery, MD Taking Active Self  levETIRAcetam (KEPPRA) 250 MG tablet [id number] Yes TAKE 1 TABLET(250 MG) BY MOUTH TWICE DAILY  Patient taking differently: Take 250 mg by mouth 2 (two) times daily.   Hassell Done Mary-Margaret, FNP Taking Active Self  levothyroxine (SYNTHROID) 88 MCG tablet [id number] Yes Take 1 tablet (88 mcg total) by mouth daily. [name], MD Taking Active Self  losartan (COZAAR) 100 MG tablet [id number] Yes TAKE 1/2 TABLET BY MOUTH DAILY  Patient taking differently: Take 100 mg by mouth daily.   [name], MD Taking Active Self  Multiple Vitamins-Minerals (MULTIVITAMINS THER. W/MINERALS) Sheral Flow [id number] Yes Take 1 tablet by mouth at bedtime.   [provider] Taking Active Self  MYRBETRIQ 50 MG TB24 tablet [id number] Yes Take 50 mg by mouth daily. [provider] Taking Active Self           Med Note Jimmey Ralph, [facility] I   Mon Sep 28, 2021  6:02 PM) Hasn't started  rivaroxaban (XARELTO) 20 MG TABS tablet [id number] Yes Take 1 tablet (20 mg total) by mouth daily with supper. Minus Breeding, MD Taking Active Self  tamsulosin (FLOMAX) 0.4 MG CAPS capsule [id number] Yes TAKE 1 CAPSULE(0.4 MG) BY MOUTH DAILY AFTER AND SUPPER [name], MD Taking Active Self            Patient Active Problem List   Diagnosis Date Noted   Right sided numbness 09/27/2021   Pain in joint of right knee 12/01/2020   Bradycardia 07/29/2020    Second degree AV block, Mobitz type II    Syncope 07/08/2020   Acute midline low back pain without sciatica    Lumbar disc disease with radiculopathy 06/30/2020   Weakness of both lower extremities 06/30/2020   Chronic diastolic HF (heart failure) ([place]) 01/16/2020   Acute exacerbation of CHF (congestive heart failure) ([place]) 08/19/2018   Hypertensive urgency 08/19/2018   Gait disorder 07/13/2018   Recurrent syncope 07/13/2018   E. coli UTI 06/19/2018   Leucocytosis 06/19/2018   Normal pressure hydrocephalus ([place]) 06/01/2018   Altered mental status 05/31/2018   AMS (altered mental status) 05/30/2018   Seizure disorder ([place]) 05/30/2018   Fracture of proximal phalanx of finger fifth 06/17/2017 07/21/2017   Upper airway cough syndrome 11/01/2016   Paroxysmal atrial flutter ([place]) 08/05/2016   Coronary artery disease involving native coronary artery of native heart without angina pectoris 08/05/2016   Chronic anticoagulation 12/31/2015   History of seizures 50/53/9767   Acute diastolic CHF (congestive heart failure) ([place]) 12/24/2015   Pulmonary edema    Paroxysmal atrial fibrillation (HCC)    Hypothyroidism    Controlled type 2  diabetes mellitus with complication, without long-term current use of insulin ([place])    Hypertensive emergency 12/20/2015   Special screening for malignant neoplasms, colon 12/07/2013   Bowel habit changes 12/07/2013   Diarrhea 03/09/2013   History of cardioembolic cerebrovascular accident (CVA) 01/31/2013   Chest pain 01/29/2013   Bilateral carotid artery disease ([place])    Loss of coordination 12/03/2012   Morbid obesity due to excess calories ([place]) 08/20/2011   CAD S/P percutaneous coronary angioplasty    Dyslipidemia    HTN (hypertension)     Immunization History  Administered Date(s) Administered   Fluad Quad(high Dose 65+) 12/12/2018, 01/17/2020   Influenza, High Dose Seasonal PF 12/09/2016, 01/31/2018   Influenza,inj,Quad PF,6+ Mos 12/04/2012,  01/01/2014, 01/15/2015, 12/22/2015   PFIZER(Purple Top)SARS-COV-2 Vaccination 04/29/2019, 05/23/2019   Pneumococcal Conjugate-13 03/01/2013   Pneumococcal Polysaccharide-23 02/03/1999, 02/27/2010   Td 08/16/1996   Tdap 07/31/2014    Conditions to be addressed/monitored:  Hypertension, CHF, Afib, HLD  Care Plan : General Pharmacy (Adult)  Updates made by Edythe Clarity, RPH since 10/28/2021 12:00 AM     Problem: HTN, HLD   Priority: High  Onset Date: 10/28/2021     Long-Range Goal: Patient-Specific Goal   Start Date: 10/28/2021  Expected End Date: 04/30/2022  This Visit's Progress: On track  Priority: High  Note:   Current Barriers:  Unable to achieve control of BP   Pharmacist Clinical Goal(s):  Patient will achieve control of BP as evidenced by home monitoring through collaboration with PharmD and provider.   Interventions: 1:1 collaboration with [name], MD regarding development and update of comprehensive plan of care as evidenced by provider attestation and co-signature Inter-disciplinary care team collaboration (see longitudinal plan of care) Comprehensive medication review performed; medication list updated in electronic medical record  Hypertension (BP goal <130/80) -Uncontrolled -Current treatment: Amlodipine 6m Appropriate, Query effective, ,  Losartan 1013mAppropriate, Query effective, ,  Clonidine 0.42m27mwice daily Appropriate, Query effective, ,  -Medications previously tried: none noted  -Current home readings: 157/56 last night -Denies hypotensive/hypertensive symptoms -Educated on BP goals and benefits of medications for prevention of heart attack, stroke and kidney damage; Daily salt intake goal < 2300 mg; Importance of home blood pressure monitoring; Symptoms of hypotension and importance of maintaining adequate hydration; -Counseled to monitor BP at home daily, document, and provide log at future appointments -Recommended to continue  current medication Recently added metformin and increased amlodipine to 8m27mily.  BP has since improved some, but he is still having fluctuating numbers.  He is splitting up times of day he is taking these.  Recommend continue to monitor, check daily and come in for FU appt if still consistently elevated.  He also changed his thyroid meds in February and has not had recheck of TSH.  Active order for TSH in chart.  Consult with PCP on FU and return to clinic for labs and BP check once discussed with provider.  Hyperlipidemia: (LDL goal < 70) -Uncontrolled -Current treatment: Atorvastatin 80mg70mly Appropriate, Query effective, ,  -Medications previously tried: none noted  -LDL above goal for CAD, Afib -Educated on Cholesterol goals;  Benefits of statin for ASCVD risk reduction; Importance of limiting foods high in cholesterol; -Recommended to continue current medication Could consider addition of Zetia in the future.   Would recommend to check TSH as secondary cause.   Patient Goals/Self-Care Activities Patient will:  - take medications as prescribed as evidenced by patient report and record review check blood  pressure daily, document, and provide at future appointments target a minimum of 150 minutes of moderate intensity exercise weekly  Follow Up Plan: The care management team will reach out to the patient again over the next 180 days.       Medication Assistance: None required.  Patient affirms current coverage meets needs.  Compliance/Adherence/Medication fill history: Care Gaps: Eye exam Foot exam  Star-Rating Drugs: Atorvastatin 59m 10/12/21 90ds Losartan 102m6/29/23 90ds   Patient's preferred pharmacy is:  [phone] [place]hone: [phone]: [id number]C [place] [address] [place]one: [phone]: [phone] [place] [place] [place] [place] [place]one: [phone]: [phone] Pontesansitions of Care [facility] [address] [place]: [phone]: [phone] pill box? Yes Pt endorses 100% compliance  We discussed: Benefits of medication synchronization, packaging and delivery as well as enhanced pharmacist oversight with [employer]. Patient decided to: Continue current medication management strategy  Care Plan and Follow Up Patient Decision:  Patient agrees to Care Plan and Follow-up.  Plan: The care management team will reach out to the patient again over the next 180 days.  ChrBeverly MilchharmD, CPP Clinical Pharmacist Practitioner [phone]

## 2021-10-22 NOTE — Progress Notes (Signed)
Subjective:    Patient ID: [name], male    DOB: 1933-09-13, 88 y.o.   MRN: [id number]   10/15/21 Patient is concerned because his blood pressure has been fluctuating dramatically.  Over the last week he has checked his blood pressure 6 times.  The lowest reading is 112/48.  The highest reading is 172/72.  Sometimes will be excellent 122/55.  The very next time he checks it it will be high 172/72.  Patient is concerned because his blood pressure does not seem to be following any consistent pattern.  He is not checking it with activity.  He is not checking it when he is nervous.  He is not checking it after exercise.  Of note, he also has a lesion at the angle of his left mandible.  Is roughly 6 to 7 mm in diameter.  It has raised rolled edges with a central ulcer.  It appears to be a basal cell cancer.  He states has been there for 3 or 4 months.  At that time, my plan was:  I think his blood pressure is fluctuating due to atherosclerosis associated with age along with the fact he is on clonidine.  He is taking clonidine twice a day and I do not feel it is giving him consistent 24-hour day coverage.  Therefore I want him to increase losartan to 100 mg and decrease clonidine to 0.1 mg twice a day.  I will recheck the patient's blood pressure here in a week.  My plan is to transition the patient gradually off clonidine and replace with amlodipine to try to give better 24 hours a day, 7 days a week coverage.  Reassess in 1 week.  Consult dermatology regarding the lesion on the left side of his face at the angle of the mandible appears to be a basal cell cancer.  10/22/21 Since making those changes, his blood pressure is consistently been 637-858I systolic.  Diastolic blood pressures are excellent.  However he states that he feels fine.  He denies any chest pain shortness of breath or dyspnea on exertion   Past Medical History:  Diagnosis Date   Atrial flutter (HCC)    BPH (benign prostatic  hyperplasia)    Coronary artery disease    a. s/p MI tx with Cypher DES to pRCA in 4/04;  b. Echocardiogram 7/09: Normal LV function.  c. Nuclear study 3/13 no ischemia;  d. ETT 2/14 neg;  e. admx with CP => LHC (01/30/2013):  pLAD 40-50%, oD1 70-80%, oOM1 40%, pRCA stent patent, mid RCA 30%, EF 60-65%. => med Rx.   DDD (degenerative disc disease)    Diabetes mellitus without complication ([place])    Dyslipidemia    Gait disorder 07/13/2018   Hemorrhoids    Hx MRSA infection    left buttocks abscess   Hx of echocardiogram    a. Echocardiogram (01/31/2013): Mild focal basal and mild concentric hypertrophy of the septum, EF 50-55%, normal wall motion, grade 1 diastolic dysfunction, trivial AI, MAC, mild LAE, PASP 35   Hyperlipidemia    Hypertension    Hypothyroidism    Meniere's disease    Status post shunt   Myocardial infarction [facility]) 2008   Occlusion and stenosis of carotid artery without mention of cerebral infarction    40-59% on carotid doppler 2014; [place] (01/2013): R 1-39%, L 60-79%   Rotator cuff injury    chronic rotator cuff injury status post repair   Seizure disorder ([place])  Stroke [facility])    a. 01/2013=> post cardiac cath CVA to L post communicating artery system; R sided weakness   Syncope    Past Surgical History:  Procedure Laterality Date   COLONOSCOPY     CORONARY ANGIOPLASTY WITH STENT PLACEMENT  07/14/2002   Stent to the right coronary artery   ENDOLYMPHATIC SHUNT DECOMPRESSION  06/11/2009    Right endolymphatic sac decompression and shunt  placement   HERNIA REPAIR  04/10/2008   scrotal hernia repair   LEFT HEART CATHETERIZATION WITH CORONARY ANGIOGRAM N/A 01/30/2013   Procedure: LEFT HEART CATHETERIZATION WITH CORONARY ANGIOGRAM;  Surgeon: Larey Dresser, MD;  Location: [facility] CATH LAB;  Service: Cardiovascular;  Laterality: N/A;   PACEMAKER IMPLANT N/A 08/29/2020   Procedure: PACEMAKER IMPLANT;  Surgeon: [name], MD;  Location: [facility] CV LAB;  Service:  Cardiovascular;  Laterality: N/A;   ROTATOR CUFF REPAIR     for chronic rotator cuff injury   Current Outpatient Medications on File Prior to Visit  Medication Sig Dispense Refill   acetaminophen (TYLENOL) 325 MG tablet Take 1-2 tablets (325-650 mg total) by mouth every 4 (four) hours as needed for mild pain.     albuterol (VENTOLIN HFA) 108 (90 Base) MCG/ACT inhaler Inhale 2 puffs into the lungs every 4 (four) hours as needed for wheezing or shortness of breath. 18 g 11   atorvastatin (LIPITOR) 80 MG tablet TAKE 1 TABLET BY MOUTH EVERY DAY AT 6 PM (Patient taking differently: Take 80 mg by mouth daily. TAKE 1 TABLET BY MOUTH EVERY DAY AT 6 PM) 90 tablet 3   carbamazepine (CARBATROL) 300 MG 12 hr capsule TAKE 1 CAPSULE(300 MG) BY MOUTH TWICE DAILY (Patient taking differently: Take 300 mg by mouth 2 (two) times daily.) 180 capsule 3   cloNIDine (CATAPRES) 0.2 MG tablet TAKE 1 TABLET(0.2 MG) BY MOUTH TWICE DAILY (Patient taking differently: Take 0.1 mg by mouth 2 (two) times daily.) 180 tablet 3   escitalopram (LEXAPRO) 5 MG tablet TAKE 1 TABLET BY MOUTH EVERY DAY AT BEDTIME 90 tablet 0   furosemide (LASIX) 20 MG tablet TAKE 1/2 TABLET(10 MG) BY MOUTH DAILY (Patient taking differently: Take 10 mg by mouth daily.) 45 tablet 0   glucose blood test strip 1 each by Other route as needed. Use as instructed 100 each 3   glucose monitoring kit (FREESTYLE) monitoring kit 1 each by Does not apply route as needed. 1 each 0   HYDROcodone-acetaminophen (NORCO) 5-325 MG tablet Take 1 tablet by mouth every 6 (six) hours as needed for moderate pain. 15 tablet 0   isosorbide mononitrate (IMDUR) 60 MG 24 hr tablet TAKE 1/2 TABLET BY MOUTH EVERY DAY 90 tablet 3   Lancets (ACCU-CHEK SAFE-T PRO) lancets 1 each by Other route as needed. Use as instructed 100 each 3   levETIRAcetam (KEPPRA) 250 MG tablet TAKE 1 TABLET(250 MG) BY MOUTH TWICE DAILY (Patient taking differently: Take 250 mg by mouth 2 (two) times daily.) 180  tablet 1   levothyroxine (SYNTHROID) 88 MCG tablet Take 1 tablet (88 mcg total) by mouth daily. 90 tablet 3   losartan (COZAAR) 100 MG tablet TAKE 1/2 TABLET BY MOUTH DAILY (Patient taking differently: Take 100 mg by mouth daily.) 90 tablet 1   Multiple Vitamins-Minerals (MULTIVITAMINS THER. W/MINERALS) TABS Take 1 tablet by mouth at bedtime.       MYRBETRIQ 50 MG TB24 tablet Take 50 mg by mouth daily.     rivaroxaban (XARELTO) 20 MG  TABS tablet Take 1 tablet (20 mg total) by mouth daily with supper. 90 tablet 3   tamsulosin (FLOMAX) 0.4 MG CAPS capsule TAKE 1 CAPSULE(0.4 MG) BY MOUTH DAILY AFTER AND SUPPER 90 capsule 3   donepezil (ARICEPT) 5 MG tablet TAKE 1 TABLET(5 MG) BY MOUTH AT BEDTIME 90 tablet 0   No current facility-administered medications on file prior to visit.     Allergies  Allergen Reactions   Penicillins     Did it involve swelling of the face/tongue/throat, SOB, or low BP? Unknown Did it involve sudden or severe rash/hives, skin peeling, or any reaction on the inside of your mouth or nose? Unknown Did you need to seek medical attention at a hospital or doctor's office? No When did it last happen?      Decades Ago If all above answers are "NO", may proceed with cephalosporin use.     Social History   Socioeconomic History   Marital status: Married    Spouse name: Not on file   Number of children: 4   Years of education: 12th   Highest education level: Not on file  Occupational History   Occupation: Retired  Tobacco Use   Smoking status: Former    Years: 5.00    Types: Cigarettes    Quit date: 08/19/1956    Years since quitting: 65.2   Smokeless tobacco: Former    Types: Nurse, children's Use: Never used  Substance and Sexual Activity   Alcohol use: No   Drug use: No   Sexual activity: Never  Other Topics Concern   Not on file  Social History Narrative   Lives with wife.   Children live nearby and help with care.    Social Determinants of  Health   Financial Resource Strain: Low Risk  (03/06/2021)   Overall Financial Resource Strain (CARDIA)    Difficulty of Paying Living Expenses: Not hard at all  Food Insecurity: No Food Insecurity (03/06/2021)   Hunger Vital Sign    Worried About Running Out of Food in the Last Year: Never true    Ran Out of Food in the Last Year: Never true  Transportation Needs: No Transportation Needs (03/06/2021)   PRAPARE - Hydrologist (Medical): No    Lack of Transportation (Non-Medical): No  Physical Activity: Insufficiently Active (03/06/2021)   Exercise Vital Sign    Days of Exercise per Week: 3 days    Minutes of Exercise per Session: 20 min  Stress: No Stress Concern Present (03/06/2021)   Pump Back    Feeling of Stress : Not at all  Social Connections: [place] (03/06/2021)   Social Connection and Isolation Panel [NHANES]    Frequency of Communication with Friends and Family: More than three times a week    Frequency of Social Gatherings with Friends and Family: More than three times a week    Attends Religious Services: 1 to 4 times per year    Active Member of [employer] or Organizations: Yes    Attends Archivist Meetings: 1 to 4 times per year    Marital Status: Married  Human resources officer Violence: Not At Risk (03/06/2021)   Humiliation, Afraid, Rape, and Kick questionnaire    Fear of Current or Ex-Partner: No    Emotionally Abused: No    Physically Abused: No    Sexually Abused: No     Review of Systems  All other systems reviewed and are negative.      Objective:   Physical Exam Vitals reviewed.  Constitutional:      General: He is not in acute distress.    Appearance: Normal appearance. He is obese. He is not ill-appearing, toxic-appearing or diaphoretic.  Neck:     Vascular: No carotid bruit.  Cardiovascular:     Rate and Rhythm: Normal rate and regular  rhythm.     Heart sounds: Normal heart sounds.     No friction rub. No gallop.  Pulmonary:     Effort: Pulmonary effort is normal. No respiratory distress.     Breath sounds: Normal breath sounds. No stridor. No wheezing, rhonchi or rales.  Musculoskeletal:     Right lower leg: No edema.     Left lower leg: No edema.  Lymphadenopathy:     Cervical: No cervical adenopathy.  Neurological:     Mental Status: He is alert.          Assessment & Plan:  Essential hypertension Continue losartan 100 mg a day.  Continue clonidine 0.1 mg twice daily.  Add amlodipine 10 mg daily and recheck blood pressure in 2 weeks.  Hopefully with better 24-hour day consistent coverage, we can gradually wean the patient away from clonidine based on his blood pressure in 2 weeks

## 2021-10-28 ENCOUNTER — Ambulatory Visit (INDEPENDENT_AMBULATORY_CARE_PROVIDER_SITE_OTHER): Payer: Medicare Other | Admitting: Pharmacist

## 2021-10-28 DIAGNOSIS — I2510 Atherosclerotic heart disease of native coronary artery without angina pectoris: Secondary | ICD-10-CM

## 2021-10-28 DIAGNOSIS — I10 Essential (primary) hypertension: Secondary | ICD-10-CM

## 2021-10-28 DIAGNOSIS — E785 Hyperlipidemia, unspecified: Secondary | ICD-10-CM

## 2021-10-28 NOTE — Patient Instructions (Addendum)
Visit Information   Goals Addressed             This Visit's Progress    Track and Manage My Blood Pressure-Hypertension       Timeframe:  Long-Range Goal Priority:  High Start Date:  10/28/21                           Expected End Date:  04/30/21                     Follow Up Date 01/28/22    - check blood pressure 3 times per week - choose a place to take my blood pressure (home, clinic or office, retail store) - write blood pressure results in a log or diary    Why is this important?   You won't feel high blood pressure, but it can still hurt your blood vessels.  High blood pressure can cause heart or kidney problems. It can also cause a stroke.  Making lifestyle changes like losing a little weight or eating less salt will help.  Checking your blood pressure at home and at different times of the day can help to control blood pressure.  If the doctor prescribes medicine remember to take it the way the doctor ordered.  Call the office if you cannot afford the medicine or if there are questions about it.     Notes:        Patient Care Plan: General Pharmacy (Adult)     Problem Identified: HTN, HLD   Priority: High  Onset Date: 10/28/2021     Long-Range Goal: Patient-Specific Goal   Start Date: 10/28/2021  Expected End Date: 04/30/2022  This Visit's Progress: On track  Priority: High  Note:   Current Barriers:  Unable to achieve control of BP   Pharmacist Clinical Goal(s):  Patient will achieve control of BP as evidenced by home monitoring through collaboration with PharmD and provider.   Interventions: 1:1 collaboration with [name], MD regarding development and update of comprehensive plan of care as evidenced by provider attestation and co-signature Inter-disciplinary care team collaboration (see longitudinal plan of care) Comprehensive medication review performed; medication list updated in electronic medical record  Hypertension (BP goal  <130/80) -Uncontrolled -Current treatment: Amlodipine '10mg'$  Appropriate, Query effective, ,  Losartan '100mg'$  Appropriate, Query effective, ,  Clonidine 0.'1mg'$  twice daily Appropriate, Query effective, ,  -Medications previously tried: none noted  -Current home readings: 157/56 last night -Denies hypotensive/hypertensive symptoms -Educated on BP goals and benefits of medications for prevention of heart attack, stroke and kidney damage; Daily salt intake goal < 2300 mg; Importance of home blood pressure monitoring; Symptoms of hypotension and importance of maintaining adequate hydration; -Counseled to monitor BP at home daily, document, and provide log at future appointments -Recommended to continue current medication Recently added metformin and increased amlodipine to '10mg'$  daily.  BP has since improved some, but he is still having fluctuating numbers.  He is splitting up times of day he is taking these.  Recommend continue to monitor, check daily and come in for FU appt if still consistently elevated.  He also changed his thyroid meds in February and has not had recheck of TSH.  Active order for TSH in chart.  Consult with PCP on FU and return to clinic for labs and BP check once discussed with provider.  Hyperlipidemia: (LDL goal < 70) -Uncontrolled -Current treatment: Atorvastatin '80mg'$  daily Appropriate, Query effective, ,  -  Medications previously tried: none noted  -LDL above goal for CAD, Afib -Educated on Cholesterol goals;  Benefits of statin for ASCVD risk reduction; Importance of limiting foods high in cholesterol; -Recommended to continue current medication Could consider addition of Zetia in the future.   Would recommend to check TSH as secondary cause.   Patient Goals/Self-Care Activities Patient will:  - take medications as prescribed as evidenced by patient report and record review check blood pressure daily, document, and provide at future appointments target a minimum  of 150 minutes of moderate intensity exercise weekly  Follow Up Plan: The care management team will reach out to the patient again over the next 180 days.       The patient verbalized understanding of instructions, educational materials, and care plan provided today and DECLINED offer to receive copy of patient instructions, educational materials, and care plan.  Telephone follow up appointment with pharmacy team member scheduled for: 6 months  Edythe Clarity, [place], PharmD, [place] Clinical Pharmacist Practitioner [place] [phone]

## 2021-11-09 ENCOUNTER — Ambulatory Visit: Payer: Medicare Other | Admitting: Family Medicine

## 2021-11-11 ENCOUNTER — Other Ambulatory Visit: Payer: Self-pay | Admitting: Family Medicine

## 2021-11-11 DIAGNOSIS — D485 Neoplasm of uncertain behavior of skin: Secondary | ICD-10-CM | POA: Diagnosis not present

## 2021-11-11 DIAGNOSIS — C44319 Basal cell carcinoma of skin of other parts of face: Secondary | ICD-10-CM | POA: Diagnosis not present

## 2021-11-19 DIAGNOSIS — I10 Essential (primary) hypertension: Secondary | ICD-10-CM | POA: Diagnosis not present

## 2021-11-19 DIAGNOSIS — E785 Hyperlipidemia, unspecified: Secondary | ICD-10-CM | POA: Diagnosis not present

## 2021-12-11 ENCOUNTER — Other Ambulatory Visit: Payer: Self-pay | Admitting: Family Medicine

## 2021-12-11 NOTE — Telephone Encounter (Signed)
Last OV was 10/22/21, will refill medication.  Requested Prescriptions  Pending Prescriptions Disp Refills  . escitalopram (LEXAPRO) 5 MG tablet [Pharmacy Med Name: ESCITALOPRAM '5MG'$  TABLETS] 90 tablet 0    Sig: TAKE 1 TABLET BY MOUTH EVERY DAY AT BEDTIME     Psychiatry:  Antidepressants - SSRI Failed - 12/11/2021  6:34 AM      Failed - Valid encounter within last 6 months    Recent Outpatient Visits          7 months ago Essential hypertension   [place], Warren T, MD   11 months ago Hematuria, unspecified type   [place] [name], MD   1 year ago Paroxysmal atrial fibrillation [facility])   [place] [name], MD   1 year ago Paroxysmal atrial fibrillation [facility])   [place] Pickard, Cammie Mcgee, MD   1 year ago Herpes zoster without complication   [place] Eulogio Bear, NP

## 2021-12-17 DIAGNOSIS — C44319 Basal cell carcinoma of skin of other parts of face: Secondary | ICD-10-CM | POA: Diagnosis not present

## 2021-12-30 ENCOUNTER — Other Ambulatory Visit: Payer: Self-pay | Admitting: Family Medicine

## 2021-12-30 ENCOUNTER — Ambulatory Visit (INDEPENDENT_AMBULATORY_CARE_PROVIDER_SITE_OTHER): Payer: Medicare Other

## 2021-12-30 DIAGNOSIS — I441 Atrioventricular block, second degree: Secondary | ICD-10-CM | POA: Diagnosis not present

## 2021-12-30 LAB — CUP PACEART REMOTE DEVICE CHECK
Battery Remaining Longevity: 138 mo
Battery Voltage: 3.03 V
Brady Statistic AP VP Percent: 17.63 %
Brady Statistic AP VS Percent: 0.01 %
Brady Statistic AS VP Percent: 81.12 %
Brady Statistic AS VS Percent: 1.25 %
Brady Statistic RA Percent Paced: 18.14 %
Brady Statistic RV Percent Paced: 98.74 %
Date Time Interrogation Session: 20231010215252
Implantable Lead Implant Date: 20220610
Implantable Lead Implant Date: 20220610
Implantable Lead Location: 753859
Implantable Lead Location: 753860
Implantable Lead Model: 3830
Implantable Lead Model: 5076
Implantable Pulse Generator Implant Date: 20220610
Lead Channel Impedance Value: 247 Ohm
Lead Channel Impedance Value: 304 Ohm
Lead Channel Impedance Value: 323 Ohm
Lead Channel Impedance Value: 494 Ohm
Lead Channel Pacing Threshold Amplitude: 0.625 V
Lead Channel Pacing Threshold Amplitude: 0.75 V
Lead Channel Pacing Threshold Pulse Width: 0.4 ms
Lead Channel Pacing Threshold Pulse Width: 0.4 ms
Lead Channel Sensing Intrinsic Amplitude: 1.5 mV
Lead Channel Sensing Intrinsic Amplitude: 1.5 mV
Lead Channel Sensing Intrinsic Amplitude: 16.625 mV
Lead Channel Sensing Intrinsic Amplitude: 16.625 mV
Lead Channel Setting Pacing Amplitude: 1.5 V
Lead Channel Setting Pacing Amplitude: 2 V
Lead Channel Setting Pacing Pulse Width: 0.4 ms
Lead Channel Setting Sensing Sensitivity: 1.2 mV

## 2022-01-01 ENCOUNTER — Ambulatory Visit (INDEPENDENT_AMBULATORY_CARE_PROVIDER_SITE_OTHER): Payer: Medicare Other | Admitting: Family Medicine

## 2022-01-01 VITALS — BP 124/62 | HR 66 | Ht 68.0 in | Wt 181.0 lb

## 2022-01-01 DIAGNOSIS — G40909 Epilepsy, unspecified, not intractable, without status epilepticus: Secondary | ICD-10-CM

## 2022-01-01 DIAGNOSIS — R29898 Other symptoms and signs involving the musculoskeletal system: Secondary | ICD-10-CM | POA: Diagnosis not present

## 2022-01-01 MED ORDER — LEVETIRACETAM 500 MG PO TABS: 500.0000 mg | ORAL_TABLET | Freq: Two times a day (BID) | ORAL | 6 refills | Status: DC

## 2022-01-01 NOTE — Progress Notes (Signed)
Subjective:    Patient ID: [name], male    DOB: 1934/03/11, 88 y.o.   MRN: [id number]  Had MRI of L Spine in 2022: L1-L2: Moderate to severe multifactorial spinal stenosis with disc space loss, circumferential disc osteophyte complex and mild to moderate facet and ligament flavum hypertrophy greater on the left. Moderate associated left lateral recess stenosis (left L2 nerve level). Moderate to severe left and mild to moderate right L1 foraminal stenosis.   L2-L3: Disc space loss with multifactorial moderate spinal and left greater than right lateral recess stenosis. Circumferential disc osteophyte complex and moderate posterior element hypertrophy. Severe bilateral L2 foraminal stenosis.   L3-L4: Disc space loss with circumferential disc bulge. Lesser endplate spurring. Mild to moderate facet and ligament flavum hypertrophy. Mild to moderate spinal and bilateral lateral recess stenosis. Moderate bilateral L3 foraminal stenosis.   L4-L5: Moderate to severe multifactorial spinal stenosis with circumferential disc osteophyte complex and up to moderate facet and ligament flavum hypertrophy greater on the right. Moderate to severe bilateral lateral recess and foraminal stenosis (L5 and L4 nerve levels, respectively).   L5-S1: Disc space loss with bulky far lateral disc osteophyte complex. Mild to moderate facet hypertrophy. No spinal or lateral recess stenosis. Moderate to severe left and moderate right L5 foraminal stenosis.  Patient has a history of seizure disorder for which he takes Tegretol 300 mg twice daily and Keppra 250 mg twice daily.  Over the last 2 weeks, they have had witnessed seizure activity.  He will be sitting, his vision will go blurry, he will start to have uncontrolled shaking in his arms and legs.  He has not yet lost consciousness.  However he is unable to control his extremities from a few minutes.  On one occasion he had bladder incontinence at this.   Family states that he is slow to respond to their questions when this happens.  Second he reports weakness in his legs.  He has a history of moderate to severe spinal stenosis in the lumbar spine.  He is also becoming more sedentary so I believe that the deconditioning in his legs is likely related to a combination of those factors.  He denies any head trauma.  He denies any headaches.  He denies any fever or chills or cough or shortness of breath or abdominal pain or dysuria.  His neurologic exam today is normal and shows no evidence of any deficits.  Cranial nerves II through XII are grossly intact.  There is no pronator drift.  Muscle strength is 5/5 equal and symmetric in the upper and lower extremities although he does have to walk using a walker due to balance issues Past Medical History:  Diagnosis Date   Atrial flutter (HCC)    BPH (benign prostatic hyperplasia)    Coronary artery disease    a. s/p MI tx with Cypher DES to pRCA in 4/04;  b. Echocardiogram 7/09: Normal LV function.  c. Nuclear study 3/13 no ischemia;  d. ETT 2/14 neg;  e. admx with CP => LHC (01/30/2013):  pLAD 40-50%, oD1 70-80%, oOM1 40%, pRCA stent patent, mid RCA 30%, EF 60-65%. => med Rx.   DDD (degenerative disc disease)    Diabetes mellitus without complication ([place])    Dyslipidemia    Gait disorder 07/13/2018   Hemorrhoids    Hx MRSA infection    left buttocks abscess   Hx of echocardiogram    a. Echocardiogram (01/31/2013): Mild focal basal and mild concentric hypertrophy of  the septum, EF 50-55%, normal wall motion, grade 1 diastolic dysfunction, trivial AI, MAC, mild LAE, PASP 35   Hyperlipidemia    Hypertension    Hypothyroidism    Meniere's disease    Status post shunt   Myocardial infarction [facility]) 2008   Occlusion and stenosis of carotid artery without mention of cerebral infarction    40-59% on carotid doppler 2014; [place] (01/2013): R 1-39%, L 60-79%   Rotator cuff injury    chronic rotator cuff injury  status post repair   Seizure disorder ([place])    Stroke ([place])    a. 01/2013=> post cardiac cath CVA to L post communicating artery system; R sided weakness   Syncope    Past Surgical History:  Procedure Laterality Date   COLONOSCOPY     CORONARY ANGIOPLASTY WITH STENT PLACEMENT  07/14/2002   Stent to the right coronary artery   ENDOLYMPHATIC SHUNT DECOMPRESSION  06/11/2009    Right endolymphatic sac decompression and shunt  placement   HERNIA REPAIR  04/10/2008   scrotal hernia repair   LEFT HEART CATHETERIZATION WITH CORONARY ANGIOGRAM N/A 01/30/2013   Procedure: LEFT HEART CATHETERIZATION WITH CORONARY ANGIOGRAM;  Surgeon: Larey Dresser, MD;  Location: [facility] CATH LAB;  Service: Cardiovascular;  Laterality: N/A;   PACEMAKER IMPLANT N/A 08/29/2020   Procedure: PACEMAKER IMPLANT;  Surgeon: [name], MD;  Location: [facility] CV LAB;  Service: Cardiovascular;  Laterality: N/A;   ROTATOR CUFF REPAIR     for chronic rotator cuff injury   Current Outpatient Medications on File Prior to Visit  Medication Sig Dispense Refill   acetaminophen (TYLENOL) 325 MG tablet Take 1-2 tablets (325-650 mg total) by mouth every 4 (four) hours as needed for mild pain.     albuterol (VENTOLIN HFA) 108 (90 Base) MCG/ACT inhaler Inhale 2 puffs into the lungs every 4 (four) hours as needed for wheezing or shortness of breath. 18 g 11   amLODipine (NORVASC) 10 MG tablet Take 1 tablet (10 mg total) by mouth daily. 90 tablet 3   atorvastatin (LIPITOR) 80 MG tablet TAKE 1 TABLET BY MOUTH EVERY DAY AT 6 PM (Patient taking differently: Take 80 mg by mouth daily. TAKE 1 TABLET BY MOUTH EVERY DAY AT 6 PM) 90 tablet 3   carbamazepine (CARBATROL) 300 MG 12 hr capsule TAKE 1 CAPSULE(300 MG) BY MOUTH TWICE DAILY 180 capsule 3   cloNIDine (CATAPRES) 0.2 MG tablet TAKE 1 TABLET(0.2 MG) BY MOUTH TWICE DAILY (Patient taking differently: Take 0.1 mg by mouth 2 (two) times daily.) 180 tablet 3   donepezil (ARICEPT) 5 MG  tablet TAKE 1 TABLET(5 MG) BY MOUTH AT BEDTIME 90 tablet 0   escitalopram (LEXAPRO) 5 MG tablet TAKE 1 TABLET BY MOUTH EVERY DAY AT BEDTIME 90 tablet 0   furosemide (LASIX) 20 MG tablet TAKE 1/2 TABLET(10 MG) BY MOUTH DAILY 45 tablet 0   glucose blood test strip 1 each by Other route as needed. Use as instructed 100 each 3   glucose monitoring kit (FREESTYLE) monitoring kit 1 each by Does not apply route as needed. 1 each 0   HYDROcodone-acetaminophen (NORCO) 5-325 MG tablet Take 1 tablet by mouth every 6 (six) hours as needed for moderate pain. 15 tablet 0   isosorbide mononitrate (IMDUR) 60 MG 24 hr tablet TAKE 1/2 TABLET BY MOUTH EVERY DAY 90 tablet 3   Lancets (ACCU-CHEK SAFE-T PRO) lancets 1 each by Other route as needed. Use as instructed 100 each 3  levETIRAcetam (KEPPRA) 250 MG tablet TAKE 1 TABLET(250 MG) BY MOUTH TWICE DAILY (Patient taking differently: Take 250 mg by mouth 2 (two) times daily.) 180 tablet 1   levothyroxine (SYNTHROID) 88 MCG tablet Take 1 tablet (88 mcg total) by mouth daily. 90 tablet 3   losartan (COZAAR) 100 MG tablet TAKE 1/2 TABLET BY MOUTH DAILY (Patient taking differently: Take 100 mg by mouth daily.) 90 tablet 1   Multiple Vitamins-Minerals (MULTIVITAMINS THER. W/MINERALS) TABS Take 1 tablet by mouth at bedtime.       MYRBETRIQ 50 MG TB24 tablet Take 50 mg by mouth daily.     rivaroxaban (XARELTO) 20 MG TABS tablet Take 1 tablet (20 mg total) by mouth daily with supper. 90 tablet 3   tamsulosin (FLOMAX) 0.4 MG CAPS capsule TAKE 1 CAPSULE(0.4 MG) BY MOUTH DAILY AFTER AND SUPPER 90 capsule 3   No current facility-administered medications on file prior to visit.     Allergies  Allergen Reactions   Penicillins     Did it involve swelling of the face/tongue/throat, SOB, or low BP? Unknown Did it involve sudden or severe rash/hives, skin peeling, or any reaction on the inside of your mouth or nose? Unknown Did you need to seek medical attention at a hospital or  doctor's office? No When did it last happen?      Decades Ago If all above answers are "NO", may proceed with cephalosporin use.     Social History   Socioeconomic History   Marital status: Married    Spouse name: Not on file   Number of children: 4   Years of education: 12th   Highest education level: Not on file  Occupational History   Occupation: Retired  Tobacco Use   Smoking status: Former    Years: 5.00    Types: Cigarettes    Quit date: 08/19/1956    Years since quitting: 65.4   Smokeless tobacco: Former    Types: Nurse, children's Use: Never used  Substance and Sexual Activity   Alcohol use: No   Drug use: No   Sexual activity: Never  Other Topics Concern   Not on file  Social History Narrative   Lives with wife.   Children live nearby and help with care.    Social Determinants of Health   Financial Resource Strain: Low Risk  (03/06/2021)   Overall Financial Resource Strain (CARDIA)    Difficulty of Paying Living Expenses: Not hard at all  Food Insecurity: No Food Insecurity (03/06/2021)   Hunger Vital Sign    Worried About Running Out of Food in the Last Year: Never true    Ran Out of Food in the Last Year: Never true  Transportation Needs: No Transportation Needs (03/06/2021)   PRAPARE - Hydrologist (Medical): No    Lack of Transportation (Non-Medical): No  Physical Activity: Insufficiently Active (03/06/2021)   Exercise Vital Sign    Days of Exercise per Week: 3 days    Minutes of Exercise per Session: 20 min  Stress: No Stress Concern Present (03/06/2021)   [place]    Feeling of Stress : Not at all  Social Connections: [place] (03/06/2021)   Social Connection and Isolation Panel [NHANES]    Frequency of Communication with Friends and Family: More than three times a week    Frequency of Social Gatherings with Friends and Family:  More than three  times a week    Attends Religious Services: 1 to 4 times per year    Active Member of Clubs or Organizations: Yes    Attends Archivist Meetings: 1 to 4 times per year    Marital Status: Married  Human resources officer Violence: Not At Risk (03/06/2021)   Humiliation, Afraid, Rape, and Kick questionnaire    Fear of Current or Ex-Partner: No    Emotionally Abused: No    Physically Abused: No    Sexually Abused: No     Review of Systems  All other systems reviewed and are negative.      Objective:   Physical Exam Vitals reviewed.  Constitutional:      General: He is not in acute distress.    Appearance: Normal appearance. He is obese. He is not ill-appearing, toxic-appearing or diaphoretic.  Neck:     Vascular: No carotid bruit.  Cardiovascular:     Rate and Rhythm: Normal rate and regular rhythm.     Heart sounds: Normal heart sounds.     No friction rub. No gallop.  Pulmonary:     Effort: Pulmonary effort is normal. No respiratory distress.     Breath sounds: Normal breath sounds. No stridor. No wheezing, rhonchi or rales.  Musculoskeletal:     Right lower leg: No edema.     Left lower leg: No edema.  Lymphadenopathy:     Cervical: No cervical adenopathy.  Neurological:     Mental Status: He is alert.          Assessment & Plan:  Weakness of both lower extremities - Plan: Ambulatory referral to Physical Therapy  Seizure disorder ([place]) There is no evidence of any stroke or neurologic deficit 88 year old.  He denies any symptoms that suggest an underlying infection.  He insist that he is taking his medicine properly.  Therefore I will increase his Keppra to 500 mg twice daily and then reassess in 7 to 10 days.  If the patient develops any other neurologic symptoms I would recommend an MRI of the brain to evaluate further, neurology consultation.  I will consult physical therapy due to the weakness in his legs which I believe is multifactorial  related to deconditioning and also some lumbar spinal stenosis

## 2022-01-05 ENCOUNTER — Emergency Department (HOSPITAL_COMMUNITY): Payer: Medicare Other

## 2022-01-05 ENCOUNTER — Encounter (HOSPITAL_COMMUNITY): Payer: Self-pay | Admitting: Emergency Medicine

## 2022-01-05 ENCOUNTER — Emergency Department (HOSPITAL_COMMUNITY)
Admission: EM | Admit: 2022-01-05 | Discharge: 2022-01-05 | Disposition: A | Discharge status: Home / Self Care | Payer: Medicare Other | Attending: Emergency Medicine | Admitting: Emergency Medicine

## 2022-01-05 ENCOUNTER — Other Ambulatory Visit: Payer: Self-pay

## 2022-01-05 ENCOUNTER — Telehealth: Payer: Self-pay

## 2022-01-05 DIAGNOSIS — R569 Unspecified convulsions: Secondary | ICD-10-CM | POA: Diagnosis not present

## 2022-01-05 DIAGNOSIS — R350 Frequency of micturition: Secondary | ICD-10-CM | POA: Diagnosis not present

## 2022-01-05 DIAGNOSIS — I509 Heart failure, unspecified: Secondary | ICD-10-CM | POA: Diagnosis not present

## 2022-01-05 DIAGNOSIS — R531 Weakness: Secondary | ICD-10-CM | POA: Insufficient documentation

## 2022-01-05 DIAGNOSIS — I110 Hypertensive heart disease with heart failure: Secondary | ICD-10-CM | POA: Diagnosis not present

## 2022-01-05 DIAGNOSIS — E039 Hypothyroidism, unspecified: Secondary | ICD-10-CM | POA: Diagnosis not present

## 2022-01-05 DIAGNOSIS — I4891 Unspecified atrial fibrillation: Secondary | ICD-10-CM | POA: Insufficient documentation

## 2022-01-05 DIAGNOSIS — I2510 Atherosclerotic heart disease of native coronary artery without angina pectoris: Secondary | ICD-10-CM | POA: Insufficient documentation

## 2022-01-05 DIAGNOSIS — Z950 Presence of cardiac pacemaker: Secondary | ICD-10-CM | POA: Diagnosis not present

## 2022-01-05 DIAGNOSIS — E119 Type 2 diabetes mellitus without complications: Secondary | ICD-10-CM | POA: Diagnosis not present

## 2022-01-05 DIAGNOSIS — R55 Syncope and collapse: Secondary | ICD-10-CM | POA: Diagnosis not present

## 2022-01-05 DIAGNOSIS — I6381 Other cerebral infarction due to occlusion or stenosis of small artery: Secondary | ICD-10-CM | POA: Diagnosis not present

## 2022-01-05 LAB — URINALYSIS, ROUTINE W REFLEX MICROSCOPIC
Bilirubin Urine: NEGATIVE
Glucose, UA: NEGATIVE mg/dL
Hgb urine dipstick: NEGATIVE
Ketones, ur: NEGATIVE mg/dL
Leukocytes,Ua: NEGATIVE
Nitrite: NEGATIVE
Protein, ur: NEGATIVE mg/dL
Specific Gravity, Urine: 1.011 (ref 1.005–1.030)
pH: 6 (ref 5.0–8.0)

## 2022-01-05 LAB — COMPREHENSIVE METABOLIC PANEL
ALT: 16 U/L (ref 0–44)
AST: 20 U/L (ref 15–41)
Albumin: 3.7 g/dL (ref 3.5–5.0)
Alkaline Phosphatase: 79 U/L (ref 38–126)
Anion gap: 8 (ref 5–15)
BUN: 24 mg/dL — ABNORMAL HIGH (ref 8–23)
CO2: 27 mmol/L (ref 22–32)
Calcium: 8.4 mg/dL — ABNORMAL LOW (ref 8.9–10.3)
Chloride: 101 mmol/L (ref 98–111)
Creatinine, Ser: 1.21 mg/dL (ref 0.61–1.24)
GFR, Estimated: 58 mL/min — ABNORMAL LOW (ref >60)
Glucose, Bld: 129 mg/dL — ABNORMAL HIGH (ref 70–99)
Potassium: 4.9 mmol/L (ref 3.5–5.1)
Sodium: 136 mmol/L (ref 135–145)
Total Bilirubin: 0.3 mg/dL (ref 0.3–1.2)
Total Protein: 6.2 g/dL — ABNORMAL LOW (ref 6.5–8.1)

## 2022-01-05 LAB — MAGNESIUM: Magnesium: 2.6 mg/dL — ABNORMAL HIGH (ref 1.7–2.4)

## 2022-01-05 LAB — CBC WITH DIFFERENTIAL/PLATELET
Abs Immature Granulocytes: 0.04 10*3/uL (ref 0.00–0.07)
Basophils Absolute: 0.1 10*3/uL (ref 0.0–0.1)
Basophils Relative: 1 %
Eosinophils Absolute: 0.3 10*3/uL (ref 0.0–0.5)
Eosinophils Relative: 5 %
HCT: 31.3 % — ABNORMAL LOW (ref 39.0–52.0)
Hemoglobin: 10.3 g/dL — ABNORMAL LOW (ref 13.0–17.0)
Immature Granulocytes: 1 %
Lymphocytes Relative: 26 %
Lymphs Abs: 1.6 10*3/uL (ref 0.7–4.0)
MCH: 32.1 pg (ref 26.0–34.0)
MCHC: 32.9 g/dL (ref 30.0–36.0)
MCV: 97.5 fL (ref 80.0–100.0)
Monocytes Absolute: 0.7 10*3/uL (ref 0.1–1.0)
Monocytes Relative: 12 %
Neutro Abs: 3.4 10*3/uL (ref 1.7–7.7)
Neutrophils Relative %: 55 %
Platelets: 258 10*3/uL (ref 150–400)
RBC: 3.21 MIL/uL — ABNORMAL LOW (ref 4.22–5.81)
RDW: 15.4 % (ref 11.5–15.5)
WBC: 6.2 10*3/uL (ref 4.0–10.5)
nRBC: 0 % (ref 0.0–0.2)

## 2022-01-05 LAB — TSH: TSH: 2.959 u[IU]/mL (ref 0.350–4.500)

## 2022-01-05 LAB — TROPONIN I (HIGH SENSITIVITY): Troponin I (High Sensitivity): 11 ng/L (ref <18)

## 2022-01-05 NOTE — ED Notes (Signed)
Pacemaker interrogated. 

## 2022-01-05 NOTE — ED Provider Notes (Signed)
[facility] Provider Note   CSN: [id number] Arrival date & time: 01/05/22  1447     History {Add pertinent medical, surgical, social history, OB history to HPI:1} Chief Complaint  Patient presents with   Seizures    [name] is a 88 y.o. male.   Seizures Patient presents for a near syncopal episode.  Medical history includes CAD, HLD, HTN, hypothyroidism, atrial fibrillation, T2DM, CHF, seizures, heart block s/p pacemaker, BPH, CVA.  He was concern of increased frequency of seizures over the past several weeks.  He was seen by his outpatient provider and prescribed increased dose of his AEDs.  This was about a week ago.  Yesterday morning, patient sat down to drink some coffee.  He states that he sat there at the table for 1 hour.  He did not lose consciousness but states that he felt funny.  When he got up, he felt weak and fell to the floor.  When he was on the floor, he was unable to get up.  His family went across the street to get some help getting back up.  This whole episode resolved and patient states that he felt normal for the rest of the day.  He also states that he feels normal today.  He was concerned about what happened yesterday.  He is not sure if it was consistent with prior episodes of seizures.  He states that his prior episodes of seizures are not tonic-clonic but rather partial.  Patient denies any recent loss of appetite.  He has not had any increased fluid losses.  He has been having increased frequency of urination.     Home Medications Prior to Admission medications   Medication Sig Start Date End Date Taking? Authorizing Provider  acetaminophen (TYLENOL) 325 MG tablet Take 1-2 tablets (325-650 mg total) by mouth every 4 (four) hours as needed for mild pain. 06/15/18   Love, Ivan Anchors, PA-C  albuterol (VENTOLIN HFA) 108 (90 Base) MCG/ACT inhaler Inhale 2 puffs into the lungs every 4 (four) hours as needed for wheezing or shortness of  breath. Patient not taking: Reported on 01/01/2022 12/27/19   [name], MD  amLODipine (NORVASC) 10 MG tablet Take 1 tablet (10 mg total) by mouth daily. 10/22/21   [name], MD  atorvastatin (LIPITOR) 80 MG tablet TAKE 1 TABLET BY MOUTH EVERY DAY AT 6 PM Patient taking differently: Take 80 mg by mouth daily. TAKE 1 TABLET BY MOUTH EVERY DAY AT 6 PM 01/19/21   [name], MD  carbamazepine (CARBATROL) 300 MG 12 hr capsule TAKE 1 CAPSULE(300 MG) BY MOUTH TWICE DAILY 12/30/21   [name], MD  cloNIDine (CATAPRES) 0.2 MG tablet TAKE 1 TABLET(0.2 MG) BY MOUTH TWICE DAILY Patient taking differently: Take 0.1 mg by mouth 2 (two) times daily. 06/24/21   [name], MD  donepezil (ARICEPT) 5 MG tablet TAKE 1 TABLET(5 MG) BY MOUTH AT BEDTIME 10/22/21   [name], MD  escitalopram (LEXAPRO) 5 MG tablet TAKE 1 TABLET BY MOUTH EVERY DAY AT BEDTIME 12/11/21   [name], MD  furosemide (LASIX) 20 MG tablet TAKE 1/2 TABLET(10 MG) BY MOUTH DAILY 11/11/21   [name], MD  glucose blood test strip 1 each by Other route as needed. Use as instructed Patient not taking: Reported on 01/01/2022 08/25/18   Alycia Rossetti, MD  glucose monitoring kit (FREESTYLE) monitoring kit 1 each by Does not apply route as  needed. Patient not taking: Reported on 01/01/2022 08/25/18   Alycia Rossetti, MD  HYDROcodone-acetaminophen [facility]) 5-325 MG tablet Take 1 tablet by mouth every 6 (six) hours as needed for moderate pain. 07/07/20   [name], MD  isosorbide mononitrate (IMDUR) 60 MG 24 hr tablet TAKE 1/2 TABLET BY MOUTH EVERY DAY 10/13/21   [name], MD  Lancets (ACCU-CHEK SAFE-T PRO) lancets 1 each by Other route as needed. Use as instructed Patient not taking: Reported on 01/01/2022 08/25/18   Alycia Rossetti, MD  levETIRAcetam (KEPPRA) 250 MG tablet TAKE 1 TABLET(250 MG) BY MOUTH TWICE DAILY Patient taking differently: Take 250 mg by mouth 2 (two) times  daily. 08/27/21   Hassell Done, Mary-Margaret, FNP  levETIRAcetam (KEPPRA) 500 MG tablet Take 1 tablet (500 mg total) by mouth 2 (two) times daily. 01/01/22   [name], MD  levothyroxine (SYNTHROID) 88 MCG tablet Take 1 tablet (88 mcg total) by mouth daily. 05/12/21   [name], MD  losartan (COZAAR) 100 MG tablet TAKE 1/2 TABLET BY MOUTH DAILY Patient taking differently: Take 100 mg by mouth daily. 07/06/21   [name], MD  Multiple Vitamins-Minerals (MULTIVITAMINS THER. W/MINERALS) TABS Take 1 tablet by mouth at bedtime.      [provider]  MYRBETRIQ 50 MG TB24 tablet Take 50 mg by mouth daily. 08/07/20   [provider]  rivaroxaban (XARELTO) 20 MG TABS tablet Take 1 tablet (20 mg total) by mouth daily with supper. 09/18/21   Minus Breeding, MD  tamsulosin (FLOMAX) 0.4 MG CAPS capsule TAKE 1 CAPSULE(0.4 MG) BY MOUTH DAILY AFTER AND SUPPER 05/13/21   [name], MD      Allergies    Penicillins    Review of Systems   Review of Systems  Genitourinary:  Positive for frequency.  Neurological:  Positive for weakness (Transient).       Near syncopal episode yesterday  All other systems reviewed and are negative.   Physical Exam Updated Vital Signs BP 132/64   Pulse 61   Temp 97.8 F (36.6 C) (Oral)   Resp 18   SpO2 97%  Physical Exam Vitals and nursing note reviewed.  Constitutional:      General: He is not in acute distress.    Appearance: Normal appearance. He is well-developed. He is not ill-appearing.  HENT:     Head: Normocephalic and atraumatic.     Right Ear: External ear normal.     Left Ear: External ear normal.     Nose: Nose normal.     Mouth/Throat:     Mouth: Mucous membranes are moist.     Pharynx: Oropharynx is clear.  Eyes:     Extraocular Movements: Extraocular movements intact.     Conjunctiva/sclera: Conjunctivae normal.  Cardiovascular:     Rate and Rhythm: Normal rate and regular rhythm.     Heart sounds: No  murmur heard. Pulmonary:     Effort: Pulmonary effort is normal. No respiratory distress.     Breath sounds: Normal breath sounds. No wheezing or rales.  Chest:     Chest wall: No tenderness.  Abdominal:     General: There is no distension.     Palpations: Abdomen is soft.     Tenderness: There is no abdominal tenderness.  Musculoskeletal:        General: No swelling. Normal range of motion.     Cervical back: Normal range of motion and neck supple.  Right lower leg: No edema.     Left lower leg: No edema.  Skin:    General: Skin is warm and dry.     Capillary Refill: Capillary refill takes less than 2 seconds.     Coloration: Skin is not jaundiced or pale.  Neurological:     General: No focal deficit present.     Mental Status: He is alert and oriented to person, place, and time.     Cranial Nerves: No cranial nerve deficit.     Sensory: No sensory deficit.     Motor: No weakness.     Coordination: Coordination normal.  Psychiatric:        Mood and Affect: Mood normal.        Behavior: Behavior normal.        Thought Content: Thought content normal.        Judgment: Judgment normal.     ED Results / Procedures / Treatments   Labs (all labs ordered are listed, but only abnormal results are displayed) Labs Reviewed - No data to display  EKG None  Radiology No results found.  Procedures Procedures  {Document cardiac monitor, telemetry assessment procedure when appropriate:1}  Medications Ordered in ED Medications - No data to display  ED Course/ Medical Decision Making/ A&P                           Medical Decision Making  This patient presents to the ED for concern of ***, this involves an extensive number of treatment options, and is a complaint that carries with it a high risk of complications and morbidity.  The differential diagnosis includes ***   Co morbidities that complicate the patient evaluation  ***   Additional history  obtained:  Additional history obtained from *** External records from outside source obtained and reviewed including ***   Lab Tests:  I Ordered, and personally interpreted labs.  The pertinent results include:  ***   Imaging Studies ordered:  I ordered imaging studies including ***  I independently visualized and interpreted imaging which showed *** I agree with the radiologist interpretation   Cardiac Monitoring: / EKG:  The patient was maintained on a cardiac monitor.  I personally viewed and interpreted the cardiac monitored which showed an underlying rhythm of: ***   Consultations Obtained:  I requested consultation with the ***,  and discussed lab and imaging findings as well as pertinent plan - they recommend: ***   Problem List / ED Course / Critical interventions / Medication management  *** I ordered medication including ***  for ***  Reevaluation of the patient after these medicines showed that the patient {resolved/improved/worsened:23923::"improved"} I have reviewed the patients home medicines and have made adjustments as needed   Social Determinants of Health:  ***   Test / Admission - Considered:  ***'  {Document critical care time when appropriate:1} {Document review of labs and clinical decision tools ie heart score, Chads2Vasc2 etc:1}  {Document your independent review of radiology images, and any outside records:1} {Document your discussion with family members, caretakers, and with consultants:1} {Document social determinants of health affecting pt's care:1} {Document your decision making why or why not admission, treatments were needed:1} Final Clinical Impression(s) / ED Diagnoses Final diagnoses:  None    Rx / DC Orders ED Discharge Orders     None

## 2022-01-05 NOTE — Discharge Instructions (Signed)
Continue taking your home medications as prescribed.  Monitor your blood pressure.  Consider spacing out your morning medications so that you are not taking multiple blood pressure medications at once.  Call the number below to set up a follow-up appointment with neurology.  Return to the emergency department for any new or worsening symptoms of concern.

## 2022-01-05 NOTE — ED Notes (Signed)
Pt's pants were wet where pt urinated. Asked pt if they wanted a new brief and dry clothes. Pt declined and said they would "be ok" until pt got home. Pt expressed eagerness to get home.

## 2022-01-05 NOTE — ED Triage Notes (Signed)
Pt hx of seizures. Pt states sat at table for an hour and felt "funny". States when he got up he fell/"eased down the wall". Pt denies pain from fall. Denies loc. Pt a/o. Pt has been ambulatory and states everything his normal today. Denies trouble swallowing/speaking or drooling. Pt denies any weakness on one side more than the other. Denies blurred vision. Dr changed seizure meds last week.

## 2022-01-05 NOTE — Telephone Encounter (Signed)
Pt's daughter called in stating that pt is still experiencing seizures. Pt's daughter stated that pt had a seizure last night that made him extremely weak as to where he fell on the floor and had to crawl around before getting strength to get up. Pt's daughter wants to know if there is another med that pcp can send to pharmacy to help with seizures. Please advise.   CB#: Please call daughter York Ram [phone]

## 2022-01-06 ENCOUNTER — Telehealth: Payer: Self-pay

## 2022-01-06 NOTE — Telephone Encounter (Signed)
Pt's daughter called in stating that pt did go to the e.r yesterday. Pt's daughter also stated that m.d from hospital stated that pt should discuss his bp meds with his pcp. Pt's daughter stated that she would like to talk to pcp/nurse about this please. Please advise.  Cb#: Please call daughter [name] at [phone]

## 2022-01-07 ENCOUNTER — Other Ambulatory Visit: Payer: Self-pay | Admitting: Family Medicine

## 2022-01-07 DIAGNOSIS — G40909 Epilepsy, unspecified, not intractable, without status epilepticus: Secondary | ICD-10-CM

## 2022-01-07 NOTE — Telephone Encounter (Signed)
Patient's daughter Maudie Mercury left a voicemail at the end of clinic yesterday to follow up on previous message; unsure of how to adjust patient's medication. Requesting call back asap.  Please advise at [phone].

## 2022-01-07 NOTE — Telephone Encounter (Signed)
Spoke with pt's daughter, York Ram. Maudie Mercury states they didi take Mr. Lofits to the ER on Tuesday and the ER Physician suggested that the pt's BP medications could be bottoming out his BP and that could be causing his spells. They were advised to not have the patient take any 2 of his BP meds at the same time. They have split up his Isosorbide and Losartan doses. Maudie Mercury asks how patient should be taking his BP medications.   Kim also asks for a referral to [facility]. Pt has not been seen there since 2020. Thank you.

## 2022-01-12 NOTE — Progress Notes (Signed)
Remote pacemaker transmission.   

## 2022-01-13 ENCOUNTER — Ambulatory Visit: Payer: Medicare Other | Attending: Family Medicine | Admitting: Physical Therapy

## 2022-01-13 DIAGNOSIS — M6281 Muscle weakness (generalized): Secondary | ICD-10-CM | POA: Insufficient documentation

## 2022-01-13 DIAGNOSIS — R2689 Other abnormalities of gait and mobility: Secondary | ICD-10-CM | POA: Insufficient documentation

## 2022-01-13 DIAGNOSIS — R29898 Other symptoms and signs involving the musculoskeletal system: Secondary | ICD-10-CM | POA: Insufficient documentation

## 2022-01-13 DIAGNOSIS — Z9181 History of falling: Secondary | ICD-10-CM | POA: Insufficient documentation

## 2022-01-13 DIAGNOSIS — R2681 Unsteadiness on feet: Secondary | ICD-10-CM | POA: Diagnosis not present

## 2022-01-13 NOTE — Therapy (Signed)
OUTPATIENT PHYSICAL THERAPY NEURO EVALUATION   Patient Name: [name] MRN: [id number] DOB:08/04/1933, 88 y.o., male Today's Date: 01/13/2022   PCP: [name], MD  REFERRING PROVIDER: [name], MD    PT End of Session - 01/13/22 1237     Visit Number 1    Number of Visits 13   Plus eval   Date for PT Re-Evaluation 03/10/22    Authorization Type BCBS    PT Start Time 1232    PT Stop Time 1321    PT Time Calculation (min) 49 min    Activity Tolerance Patient tolerated treatment well    Behavior During Therapy WFL for tasks assessed/performed             Past Medical History:  Diagnosis Date   Atrial flutter ([place])    BPH (benign prostatic hyperplasia)    Coronary artery disease    a. s/p MI tx with Cypher DES to pRCA in 4/04;  b. Echocardiogram 7/09: Normal LV function.  c. Nuclear study 3/13 no ischemia;  d. ETT 2/14 neg;  e. admx with CP => LHC (01/30/2013):  pLAD 40-50%, oD1 70-80%, oOM1 40%, pRCA stent patent, mid RCA 30%, EF 60-65%. => med Rx.   DDD (degenerative disc disease)    Diabetes mellitus without complication ([place])    Dyslipidemia    Gait disorder 07/13/2018   Hemorrhoids    Hx MRSA infection    left buttocks abscess   Hx of echocardiogram    a. Echocardiogram (01/31/2013): Mild focal basal and mild concentric hypertrophy of the septum, EF 50-55%, normal wall motion, grade 1 diastolic dysfunction, trivial AI, MAC, mild LAE, PASP 35   Hyperlipidemia    Hypertension    Hypothyroidism    Meniere's disease    Status post shunt   Myocardial infarction [facility]) 2008   Occlusion and stenosis of carotid artery without mention of cerebral infarction    40-59% on carotid doppler 2014; [place] (01/2013): R 1-39%, L 60-79%   Rotator cuff injury    chronic rotator cuff injury status post repair   Seizure disorder ([place])    Stroke ([place])    a. 01/2013=> post cardiac cath CVA to L post communicating artery system; R sided weakness   Syncope    Past  Surgical History:  Procedure Laterality Date   COLONOSCOPY     CORONARY ANGIOPLASTY WITH STENT PLACEMENT  07/14/2002   Stent to the right coronary artery   ENDOLYMPHATIC SHUNT DECOMPRESSION  06/11/2009    Right endolymphatic sac decompression and shunt  placement   HERNIA REPAIR  04/10/2008   scrotal hernia repair   LEFT HEART CATHETERIZATION WITH CORONARY ANGIOGRAM N/A 01/30/2013   Procedure: LEFT HEART CATHETERIZATION WITH CORONARY ANGIOGRAM;  Surgeon: Larey Dresser, MD;  Location: [facility] C[facility] LAB;  Service: Cardiovascular;  Laterality: N/A;   PACEMAKER IMPLANT N/A 08/29/2020   Procedure: PACEMAKER IMPLANT;  Surgeon: [name], MD;  Location: [place] CV LAB;  Service: Cardiovascular;  Laterality: N/A;   ROTATOR CUFF REPAIR     for chronic rotator cuff injury   Patient Active Problem List   Diagnosis Date Noted   Right sided numbness 09/27/2021   Pain in joint of right knee 12/01/2020   Bradycardia 07/29/2020   Second degree AV block, Mobitz type II    Syncope 07/08/2020   Acute midline low back pain without sciatica    Lumbar disc disease with radiculopathy 06/30/2020   Weakness of both lower extremities  06/30/2020   Chronic diastolic HF (heart failure) ([place]) 01/16/2020   Acute exacerbation of CHF (congestive heart failure) ([place]) 08/19/2018   Hypertensive urgency 08/19/2018   Gait disorder 07/13/2018   Recurrent syncope 07/13/2018   E. coli UTI 06/19/2018   Leucocytosis 06/19/2018   Normal pressure hydrocephalus ([place]) 06/01/2018   Altered mental status 05/31/2018   AMS (altered mental status) 05/30/2018   Seizure disorder ([place]) 05/30/2018   Fracture of proximal phalanx of finger fifth 06/17/2017 07/21/2017   Upper airway cough syndrome 11/01/2016   Paroxysmal atrial flutter ([place]) 08/05/2016   Coronary artery disease involving native coronary artery of native heart without angina pectoris 08/05/2016   Chronic anticoagulation 12/31/2015   History of seizures 44/05/4740    Acute diastolic CHF (congestive heart failure) ([place]) 12/24/2015   Pulmonary edema    Paroxysmal atrial fibrillation ([place])    Hypothyroidism    Controlled type 2 diabetes mellitus with complication, without long-term current use of insulin ([place])    Hypertensive emergency 12/20/2015   Special screening for malignant neoplasms, colon 12/07/2013   Bowel habit changes 12/07/2013   Diarrhea 03/09/2013   History of cardioembolic cerebrovascular accident (CVA) 01/31/2013   Chest pain 01/29/2013   Bilateral carotid artery disease ([place])    Loss of coordination 12/03/2012   Morbid obesity due to excess calories ([place]) 08/20/2011   CAD S/P percutaneous coronary angioplasty    Dyslipidemia    HTN (hypertension)     ONSET DATE: 01/01/2022 (referral)   REFERRING DIAG: R29.898 (ICD-10-CM) - Weakness of both lower extremities   THERAPY DIAG:  Muscle weakness (generalized)  Unsteadiness on feet  Other abnormalities of gait and mobility  History of falling  Rationale for Evaluation and Treatment Rehabilitation  SUBJECTIVE:                                                                                                                                                                                             SUBJECTIVE STATEMENT: Pt reports he  RLE weakness  Multiple falls  RLE pain since falling in a hole a year ago    Pt accompanied by:  Daughter, Pam   PERTINENT HISTORY: CAD, HLD, HTN, hypothyroidism, atrial fibrillation, T2DM, CHF, seizures, heart block s/p pacemaker, BPH, CVA   PAIN:  Are you having pain? Yes: NPRS scale: 5/10 Pain location: RLE Pain description: [place]  PRECAUTIONS: Fall and ICD/Pacemaker  WEIGHT BEARING RESTRICTIONS: No  FALLS: Has patient fallen in last 6 months? Yes. Number of falls 2-3  LIVING ENVIRONMENT: Lives with: lives with their spouse Lives in: House/apartment Stairs: Yes: External: 2 steps; can reach both Has following  equipment at home:  Single point cane and Grab bars  PLOF: Requires assistive device for independence and Needs assistance with ADLs  PATIENT GOALS: "Only thing I know of is this leg"   OBJECTIVE:   DIAGNOSTIC FINDINGS:  CT of brain in 10/23  IMPRESSION: 1. No acute intracranial pathology. 2. Signs of remote LEFT thalamic infarct and lacunar infarcts in the LEFT basal ganglia also chronic and unchanged. 3. Signs of RIGHT mastoidectomy similar to previous imaging.  MRI Lumbar Spine in 06/2020 Compared to the MRI the lumbar spine performed 06/30/2020, there is no change in the degree of multilevel severe spondylosis. Spinal canal stenosis remains worst at L2-3 and L4-5.   IMPRESSION: 1. Unchanged appearance of hyperintense T2-weighted signal within the endplates at T4-1. No contrast enhancement in the disc space. Findings are most likely degenerative. 2. Unchanged multilevel severe spondylosis with spinal canal stenosis worst at L2-3 and L4-5.  COGNITION: Overall cognitive status: Within functional limits for tasks assessed   SENSATION: {sensation:27233}  COORDINATION: ***  EDEMA:  {edema:24020}  MUSCLE TONE: {LE tone:25568}  MUSCLE LENGTH: Hamstrings: Right *** deg; Left *** deg Marcello Moores test: Right *** deg; Left *** deg  DTRs:  {DTR SITE:24025}  POSTURE: {posture:25561}   LOWER EXTREMITY MMT:  Tested in Seated position   MMT Right Eval Left Eval  Hip flexion    Hip extension    Hip abduction    Hip adduction    Hip internal rotation    Hip external rotation    Knee flexion    Knee extension    Ankle dorsiflexion    Ankle plantarflexion    Ankle inversion    Ankle eversion    (Blank rows = not tested)  BED MOBILITY:  Independent per pt  TRANSFERS: Assistive device utilized: Environmental consultant - 4 wheeled  Sit to stand: CGA Stand to sit: CGA  GAIT: Gait pattern: {gait characteristics:25376} Distance walked: *** Assistive device utilized: {Assistive devices:23999} Level  of assistance: {Levels of assistance:24026} Comments: ***  FUNCTIONAL TESTS:   [facility] PT Assessment - 01/13/22 1310       Transfers   Five time sit to stand comments  36.12s w/BUE support              PATIENT SURVEYS:  {rehab surveys:24030}  TODAY'S TREATMENT:    Next Session    PATIENT EDUCATION: Education details: *** Person educated: {Person educated:25204} Education method: {Education Method:25205} Education comprehension: {Education Comprehension:25206}  HOME EXERCISE PROGRAM: ***   GOALS: Goals reviewed with patient? Yes  SHORT TERM GOALS: Target date: {follow up:25551}  *** Baseline: Goal status: {GOALSTATUS:25110}  2.  *** Baseline:  Goal status: {GOALSTATUS:25110}  3.  *** Baseline:  Goal status: {GOALSTATUS:25110}  4.  *** Baseline:  Goal status: {GOALSTATUS:25110}  5.  *** Baseline:  Goal status: {GOALSTATUS:25110}  6.  *** Baseline:  Goal status: {GOALSTATUS:25110}  LONG TERM GOALS: Target date: {follow up:25551}  *** Baseline:  Goal status: {GOALSTATUS:25110}  2.  *** Baseline:  Goal status: {GOALSTATUS:25110}  3.  *** Baseline:  Goal status: {GOALSTATUS:25110}  4.  *** Baseline:  Goal status: {GOALSTATUS:25110}  5.  *** Baseline:  Goal status: {GOALSTATUS:25110}  6.  *** Baseline:  Goal status: {GOALSTATUS:25110}  ASSESSMENT:  CLINICAL IMPRESSION: Patient is a 88 year old male referred to Neuro [facility] for BLE weakness. Pt's PMH is significant for: CAD, HLD, HTN, hypothyroidism, atrial fibrillation, T2DM, CHF, seizures, heart block s/p pacemaker, BPH, CVA. The following deficits were present during the exam: ***. Based  on ***, pt is an incr risk for falls. Pt would benefit from skilled PT to address these impairments and functional limitations to maximize functional mobility independence   OBJECTIVE IMPAIRMENTS: {opptimpairments:25111}.   ACTIVITY LIMITATIONS: {activitylimitations:27494}  PARTICIPATION  LIMITATIONS: {participationrestrictions:25113}  PERSONAL FACTORS: {Personal factors:25162} are also affecting patient's functional outcome.   REHAB POTENTIAL: {rehabpotential:25112}  CLINICAL DECISION MAKING: {clinical decision making:25114}  EVALUATION COMPLEXITY: {Evaluation complexity:25115}  PLAN:  PT FREQUENCY: 2x/week  PT DURATION: 6 weeks  PLANNED INTERVENTIONS: {rehab planned interventions:25118::"Therapeutic exercises","Therapeutic activity","Neuromuscular re-education","Balance training","Gait training","Patient/Family education","Self Care","Joint mobilization"}  PLAN FOR NEXT SESSION: ***   Rontae Inglett E Tane Biegler, PT, DPT 01/13/2022, 4:48 PM

## 2022-01-14 ENCOUNTER — Other Ambulatory Visit: Payer: Self-pay | Admitting: Family Medicine

## 2022-01-14 ENCOUNTER — Telehealth: Payer: Self-pay | Admitting: Physical Therapy

## 2022-01-14 DIAGNOSIS — G8191 Hemiplegia, unspecified affecting right dominant side: Secondary | ICD-10-CM

## 2022-01-14 NOTE — Telephone Encounter (Signed)
[name], [place]. [place] was evaluated by physical therapy on 01/13/22.  The patient would benefit from an occupational therapy evaluation for right hand weakness/shoulder pain secondary to chronic hemiparesis.    If you agree, please place an order in [facility] workque in [facility] or fax the order to [phone].  Thank you, Francena Hanly, PT, [place] [address] [place], [place]  [address] Phone:  [phone] Fax:  [phone]

## 2022-01-18 ENCOUNTER — Ambulatory Visit: Payer: Medicare Other | Admitting: Physical Therapy

## 2022-01-18 ENCOUNTER — Encounter: Payer: Self-pay | Admitting: Physical Therapy

## 2022-01-18 DIAGNOSIS — M6281 Muscle weakness (generalized): Secondary | ICD-10-CM

## 2022-01-18 DIAGNOSIS — Z9181 History of falling: Secondary | ICD-10-CM

## 2022-01-18 DIAGNOSIS — R2689 Other abnormalities of gait and mobility: Secondary | ICD-10-CM

## 2022-01-18 DIAGNOSIS — R2681 Unsteadiness on feet: Secondary | ICD-10-CM

## 2022-01-18 DIAGNOSIS — R29898 Other symptoms and signs involving the musculoskeletal system: Secondary | ICD-10-CM | POA: Diagnosis not present

## 2022-01-18 NOTE — Therapy (Signed)
OUTPATIENT PHYSICAL THERAPY NEURO TREATMENT   Patient Name: [name] MRN: [id number] DOB:[birth year], 88 y.o., male Today's Date: 01/18/2022   PCP: [name], MD  REFERRING PROVIDER: [name], MD    PT End of Session - 01/18/22 1415     Visit Number 2    Number of Visits 13    Date for PT Re-Evaluation 03/10/22    Authorization Type BCBS    PT Start Time 1307    PT Stop Time 8882    PT Time Calculation (min) 50 min    Equipment Utilized During Treatment Gait belt    Activity Tolerance Patient tolerated treatment well    Behavior During Therapy WFL for tasks assessed/performed              Past Medical History:  Diagnosis Date   Atrial flutter ([place])    BPH (benign prostatic hyperplasia)    Coronary artery disease    a. s/p MI tx with Cypher DES to pRCA in 4/04;  b. Echocardiogram 7/09: Normal LV function.  c. Nuclear study 3/13 no ischemia;  d. ETT 2/14 neg;  e. admx with CP => LHC (01/30/2013):  pLAD 40-50%, oD1 70-80%, oOM1 40%, pRCA stent patent, mid RCA 30%, EF 60-65%. => med Rx.   DDD (degenerative disc disease)    Diabetes mellitus without complication ([place])    Dyslipidemia    Gait disorder 07/13/2018   Hemorrhoids    Hx MRSA infection    left buttocks abscess   Hx of echocardiogram    a. Echocardiogram (01/31/2013): Mild focal basal and mild concentric hypertrophy of the septum, EF 50-55%, normal wall motion, grade 1 diastolic dysfunction, trivial AI, MAC, mild LAE, PASP 35   Hyperlipidemia    Hypertension    Hypothyroidism    Meniere's disease    Status post shunt   Myocardial infarction [facility]) 2008   Occlusion and stenosis of carotid artery without mention of cerebral infarction    40-59% on carotid doppler 2014; [place] (01/2013): R 1-39%, L 60-79%   Rotator cuff injury    chronic rotator cuff injury status post repair   Seizure disorder ([place])    Stroke ([place])    a. 01/2013=> post cardiac cath CVA to L post communicating artery  system; R sided weakness   Syncope    Past Surgical History:  Procedure Laterality Date   COLONOSCOPY     CORONARY ANGIOPLASTY WITH STENT PLACEMENT  07/14/2002   Stent to the right coronary artery   ENDOLYMPHATIC SHUNT DECOMPRESSION  06/11/2009    Right endolymphatic sac decompression and shunt  placement   HERNIA REPAIR  04/10/2008   scrotal hernia repair   LEFT HEART CATHETERIZATION WITH CORONARY ANGIOGRAM N/A 01/30/2013   Procedure: LEFT HEART CATHETERIZATION WITH CORONARY ANGIOGRAM;  Surgeon: Larey Dresser, MD;  [facility] CATH LAB;  Service: Cardiovascular;  Laterality: N/A;   PACEMAKER IMPLANT N/A 08/29/2020   Procedure: PACEMAKER IMPLANT;  Surgeon: [name], MD;  Location: [place] CV LAB;  Service: Cardiovascular;  Laterality: N/A;   ROTATOR CUFF REPAIR     for chronic rotator cuff injury   Patient Active Problem List   Diagnosis Date Noted   Right sided numbness 09/27/2021   Pain in joint of right knee 12/01/2020   Bradycardia 07/29/2020   Second degree AV block, Mobitz type II    Syncope 07/08/2020   Acute midline low back pain without sciatica    Lumbar disc disease with radiculopathy 06/30/2020  Weakness of both lower extremities 06/30/2020   Chronic diastolic HF (heart failure) ([name]) 01/16/2020   Acute exacerbation of CHF (congestive heart failure) ([place]) 08/19/2018   Hypertensive urgency 08/19/2018   Gait disorder 07/13/2018   Recurrent syncope 07/13/2018   E. coli UTI 06/19/2018   Leucocytosis 06/19/2018   Normal pressure hydrocephalus ([place]) 06/01/2018   Altered mental status 05/31/2018   AMS (altered mental status) 05/30/2018   Seizure disorder ([place]) 05/30/2018   Fracture of proximal phalanx of finger fifth 06/17/2017 07/21/2017   Upper airway cough syndrome 11/01/2016   Paroxysmal atrial flutter ([place]) 08/05/2016   Coronary artery disease involving native coronary artery of native heart without angina pectoris 08/05/2016   Chronic  anticoagulation 12/31/2015   History of seizures 76/54/6503   Acute diastolic CHF (congestive heart failure) ([place]) 12/24/2015   Pulmonary edema    Paroxysmal atrial fibrillation ([place])    Hypothyroidism    Controlled type 2 diabetes mellitus with complication, without long-term current use of insulin ([place])    Hypertensive emergency 12/20/2015   Special screening for malignant neoplasms, colon 12/07/2013   Bowel habit changes 12/07/2013   Diarrhea 03/09/2013   History of cardioembolic cerebrovascular accident (CVA) 01/31/2013   Chest pain 01/29/2013   Bilateral carotid artery disease ([place])    Loss of coordination 12/03/2012   Morbid obesity due to excess calories ([place]) 08/20/2011   CAD S/P percutaneous coronary angioplasty    Dyslipidemia    HTN (hypertension)     ONSET DATE: 01/01/2022 (referral)   REFERRING DIAG: R29.898 (ICD-10-CM) - Weakness of both lower extremities   THERAPY DIAG:  Muscle weakness (generalized)  Unsteadiness on feet  Other abnormalities of gait and mobility  History of falling  Rationale for Evaluation and Treatment Rehabilitation  SUBJECTIVE:                                                                                                                                                                                             SUBJECTIVE STATEMENT:  Had been having some issues with my BP recently, its been going crazy. They changed my medicines and I can't remember what they are, checked BP this morning 140-something/67. Right leg/knee and shoulder are stiff and weak, its been this way since I had the stroke in 2008.  Pt accompanied by:  self  PERTINENT HISTORY: CAD, HLD, HTN, hypothyroidism, atrial fibrillation, T2DM, CHF, seizures, heart block s/p pacemaker, BPH, CVA   PAIN:  Are you having pain? Yes: NPRS scale: 6/10 Pain location: R knee is painful but whole R LE is stiff, some pain in R hip  Pain description: more numbness  and aching    PRECAUTIONS: Fall and ICD/Pacemaker  WEIGHT BEARING RESTRICTIONS: No  FALLS: Has patient fallen in last 6 months? Yes. Number of falls 2-3  LIVING ENVIRONMENT: Lives with: lives with their spouse Lives in: House/apartment Stairs: Yes: External: 2 steps; can reach both Has following equipment at home: Single point cane and Grab bars  PLOF: Requires assistive device for independence and Needs assistance with ADLs  PATIENT GOALS: "Only thing I know of is this leg"   OBJECTIVE:   DIAGNOSTIC FINDINGS:  CT of brain in 10/23  IMPRESSION: 1. No acute intracranial pathology. 2. Signs of remote LEFT thalamic infarct and lacunar infarcts in the LEFT basal ganglia also chronic and unchanged. 3. Signs of RIGHT mastoidectomy similar to previous imaging.  MRI Lumbar Spine in 06/2020 Compared to the MRI the lumbar spine performed 06/30/2020, there is no change in the degree of multilevel severe spondylosis. Spinal canal stenosis remains worst at L2-3 and L4-5.   IMPRESSION: 1. Unchanged appearance of hyperintense T2-weighted signal within the endplates at S2-8. No contrast enhancement in the disc space. Findings are most likely degenerative. 2. Unchanged multilevel severe spondylosis with spinal canal stenosis worst at L2-3 and L4-5.  COGNITION: Overall cognitive status: Within functional limits for tasks assessed   SENSATION: To be assessed next session   COORDINATION: To be assessed next session   EDEMA: Noted edema in R knee located medially. TTP along MCL and medial joint line   POSTURE: rounded shoulders, forward head, increased thoracic kyphosis, posterior pelvic tilt, and flexed trunk    LOWER EXTREMITY MMT:  To be assessed next session   MMT Right 10/30 Left 10/30  Hip flexion 3- 3-  Hip extension    Hip abduction 3 4-  Hip adduction    Hip internal rotation    Hip external rotation    Knee flexion 3+ 4+  Knee extension 4+ 5  Ankle dorsiflexion 4+ 4+   Ankle plantarflexion    Ankle inversion    Ankle eversion    (Blank rows = not tested)  BED MOBILITY:  Independent per pt  TRANSFERS: Assistive device utilized: Environmental consultant - 4 wheeled  Sit to stand: CGA Stand to sit: CGA  GAIT: Gait pattern:  ER of BLEs and genu valgus of R knee>L knee, step through pattern, decreased step length- Left, decreased stance time- Right, decreased stride length, decreased hip/knee flexion- Right, decreased hip/knee flexion- Left, decreased ankle dorsiflexion- Right, decreased ankle dorsiflexion- Left, decreased trunk rotation, trunk flexed, poor foot clearance- Right, and poor foot clearance- Left Distance walked: Various clinic distances  Assistive device utilized: Environmental consultant - 4 wheeled Level of assistance: CGA Comments: Unable to fully assess gait due to time constraints   FUNCTIONAL TESTS:   [facility] PT Assessment - 01/18/22 0001       Standardized Balance Assessment   Standardized Balance Assessment Berg Balance Test      Berg Balance Test   Sit to Stand Able to stand  independently using hands    Standing Unsupported Able to stand 2 minutes with supervision    Sitting with Back Unsupported but Feet Supported on Floor or Stool Able to sit safely and securely 2 minutes    Stand to Sit Uses backs of legs against chair to control descent    Transfers Able to transfer safely, minor use of hands    Standing Unsupported with Eyes Closed Able to stand 10 seconds with supervision    Standing Unsupported with Feet Together Needs help  to attain position and unable to hold for 15 seconds    From Standing, Reach Forward with Outstretched Arm Can reach forward >5 cm safely (2")    From Standing Position, Pick up Object from Floor Unable to try/needs assist to keep balance    From Standing Position, Turn to Look Behind Over each Shoulder Looks behind one side only/other side shows less weight shift    Turn 360 Degrees Able to turn 360 degrees safely but slowly     Standing Unsupported, Alternately Place Feet on Step/Stool Needs assistance to keep from falling or unable to try    Standing Unsupported, One Foot in [place] to take small step independently and hold 30 seconds    Standing on One Leg Tries to lift leg/unable to hold 3 seconds but remains standing independently    Total Score 29            3MWT: 10/30-with rollator 458f no rest breaks   Berg: 10/30- 29/56    TODAY'S TREATMENT:      01/18/22  Objective measures:  BP seated 111/53 HR 68  MMT, 3MWT, Berg; education on purpose and functionality of all measures and clinical reasoning/relationship to functional tasks provided as appropriate   TherEx  Bridges x10 with 2 second hold  Sidelying clams x10 B red TB  STS with red TB above knees x5 focus on eccentric control   PATIENT EDUCATION: Education details: findings today, HEP, POC, exercise form and purpose, encouraged appropriate BP management  Person educated: Patient Education method: Explanation, Demonstration, Tactile cues, and Verbal cues Education comprehension: needs further education  HOME EXERCISE PROGRAM:  [id number]  GOALS: Goals reviewed with patient? Yes  SHORT TERM GOALS: Target date: 02/03/2022  Pt will perform initial HEP, including walking program, w/min A from family for improved strength, balance, transfers and gait.  Baseline: not established on eval  Goal status: INITIAL  2.  Pt will improve 5 x STS to less than or equal to 31 seconds w/BUE support to demonstrate improved functional strength and transfer efficiency.   Baseline: 36.12s  Goal status: INITIAL  3.  Will be able to ambulate at least 6051fduring 3MWT with LRAD  Baseline:  Goal status: IN PROGRESS  4.  TUG to be performed and STG/LTG written  Baseline: 10/30 not assessed  Goal status: INITIAL  5.  Will score at least 34 on the Berg to show reduced fall risk  Baseline: 10/30- 29/56 Goal status: IN PROGRESS  6.  Pt will  report <5/10 pain in R knee consistently with activity for improved QOL and pain management  Baseline: frequently >7/1/0  Goal status: INITIAL  LONG TERM GOALS: Target date: 02/24/2022  Pt and family will verbalize and implement plan to continue fitness post-DC, including utilizing [employer] performing final HEP.  Baseline:  Goal status: INITIAL  2.  Pt will improve 5 x STS to less than or equal to 25 seconds w/BUE support to demonstrate improved functional strength and transfer efficiency.   Baseline: 36.12s w/BUE support  Goal status: INITIAL  3.  TUG goal  Baseline: 10/30 did not assess, time limitations  Goal status: INITIAL  4.  Will score at least 40/56 on Berg balance test to show reduced fall risk  Baseline:  Goal status: IN PROGRESS  5.  Will be able to complete 10MWT with LRAD and no more than 1 rest break to show improved functional activity tolerance and community access  Baseline: 10/30- tolerated  3MWT but unable to tolerate longer test  Goal status: IN PROGRESS  6.  Pt will report <3/10 pain in R knee w/activity for improved pain management and QOL  Baseline: frequently >7/10 Goal status: INITIAL  ASSESSMENT:  CLINICAL IMPRESSION:  Mr. Dantes arrives today doing OK, motivated to build strength and improve balance, but also with some concerns about weakness in R UE (chronic since prior CVA). Took objective measures for MMT, Merrilee Jansky, and 3MWT with corresponding/appropriate education as indicated. Otherwise spent time working on some functional exercises and establishing appropriate HEP. Will definitely benefit from skilled PT services to address all impairments and reduce fall risk moving forward.   OBJECTIVE IMPAIRMENTS: Abnormal gait, decreased activity tolerance, decreased endurance, decreased knowledge of condition, decreased mobility, difficulty walking, decreased strength, decreased safety awareness, increased edema, impaired UE functional use,  improper body mechanics, postural dysfunction, and pain.   ACTIVITY LIMITATIONS: carrying, lifting, bending, standing, squatting, stairs, transfers, locomotion level, and caring for others  PARTICIPATION LIMITATIONS: meal prep, cleaning, laundry, driving, shopping, community activity, and yard work  PERSONAL FACTORS: Age, Fitness, Past/current experiences, Time since onset of injury/illness/exacerbation, Transportation, and 1-2 comorbidities: sedentary lifestyle, lack of support at home and history of CVA in 2014  are also affecting patient's functional outcome.   REHAB POTENTIAL: Good  CLINICAL DECISION MAKING: Evolving/moderate complexity  EVALUATION COMPLEXITY: Moderate  PLAN:  PT FREQUENCY: 2x/week  PT DURATION: 6 weeks  PLANNED INTERVENTIONS: Therapeutic exercises, Therapeutic activity, Neuromuscular re-education, Balance training, Gait training, Patient/Family education, Self Care, Joint mobilization, Stair training, Vestibular training, Canalith repositioning, Orthotic/Fit training, DME instructions, Aquatic Therapy, Dry Needling, Electrical stimulation, Moist heat, Taping, Manual therapy, and Re-evaluation  PLAN FOR NEXT SESSION: balance, strength, functional activity tolerance focus. Still needs TUG. Progressive updates to HEP.    Ann Lions PT DPT PN2  01/18/2022, 2:17 PM

## 2022-01-22 ENCOUNTER — Ambulatory Visit: Payer: Medicare Other | Attending: Family Medicine | Admitting: Physical Therapy

## 2022-01-22 ENCOUNTER — Telehealth: Payer: Self-pay

## 2022-01-22 ENCOUNTER — Other Ambulatory Visit: Payer: Self-pay | Admitting: Family Medicine

## 2022-01-22 VITALS — BP 148/69 | HR 62

## 2022-01-22 DIAGNOSIS — R2689 Other abnormalities of gait and mobility: Secondary | ICD-10-CM | POA: Diagnosis not present

## 2022-01-22 DIAGNOSIS — R2681 Unsteadiness on feet: Secondary | ICD-10-CM | POA: Diagnosis not present

## 2022-01-22 DIAGNOSIS — G8929 Other chronic pain: Secondary | ICD-10-CM | POA: Insufficient documentation

## 2022-01-22 DIAGNOSIS — Z9181 History of falling: Secondary | ICD-10-CM | POA: Insufficient documentation

## 2022-01-22 DIAGNOSIS — M25561 Pain in right knee: Secondary | ICD-10-CM | POA: Insufficient documentation

## 2022-01-22 DIAGNOSIS — M6281 Muscle weakness (generalized): Secondary | ICD-10-CM | POA: Diagnosis not present

## 2022-01-22 MED ORDER — TRAMADOL HCL 50 MG PO TABS: 50.0000 mg | ORAL_TABLET | Freq: Three times a day (TID) | ORAL | 0 refills | Status: AC | PRN

## 2022-01-22 NOTE — Therapy (Signed)
OUTPATIENT PHYSICAL THERAPY NEURO TREATMENT   Patient Name: [name] MRN: [id number] DOB:01-10-1934, 88 y.o., male Today's Date: 01/22/2022   PCP: [name], MD  REFERRING PROVIDER: [name], MD    PT End of Session - 01/22/22 1406     Visit Number 3    Number of Visits 13    Date for PT Re-Evaluation 03/10/22    Authorization Type BCBS    PT Start Time 1404    PT Stop Time 8250    PT Time Calculation (min) 43 min    Equipment Utilized During Treatment Gait belt    Activity Tolerance Patient tolerated treatment well    Behavior During Therapy WFL for tasks assessed/performed               Past Medical History:  Diagnosis Date   Atrial flutter ([place])    BPH (benign prostatic hyperplasia)    Coronary artery disease    a. s/p MI tx with Cypher DES to pRCA in 4/04;  b. Echocardiogram 7/09: Normal LV function.  c. Nuclear study 3/13 no ischemia;  d. ETT 2/14 neg;  e. admx with CP => LHC (01/30/2013):  pLAD 40-50%, oD1 70-80%, oOM1 40%, pRCA stent patent, mid RCA 30%, EF 60-65%. => med Rx.   DDD (degenerative disc disease)    Diabetes mellitus without complication ([place])    Dyslipidemia    Gait disorder 07/13/2018   Hemorrhoids    Hx MRSA infection    left buttocks abscess   Hx of echocardiogram    a. Echocardiogram (01/31/2013): Mild focal basal and mild concentric hypertrophy of the septum, EF 50-55%, normal wall motion, grade 1 diastolic dysfunction, trivial AI, MAC, mild LAE, PASP 35   Hyperlipidemia    Hypertension    Hypothyroidism    Meniere's disease    Status post shunt   Myocardial infarction [facility]) 2008   Occlusion and stenosis of carotid artery without mention of cerebral infarction    40-59% on carotid doppler 2014; [place] (01/2013): R 1-39%, L 60-79%   Rotator cuff injury    chronic rotator cuff injury status post repair   Seizure disorder ([place])    Stroke ([place])    a. 01/2013=> post cardiac cath CVA to L post communicating artery  system; R sided weakness   Syncope    Past Surgical History:  Procedure Laterality Date   COLONOSCOPY     CORONARY ANGIOPLASTY WITH STENT PLACEMENT  07/14/2002   Stent to the right coronary artery   ENDOLYMPHATIC SHUNT DECOMPRESSION  06/11/2009    Right endolymphatic sac decompression and shunt  placement   HERNIA REPAIR  04/10/2008   scrotal hernia repair   LEFT HEART CATHETERIZATION WITH CORONARY ANGIOGRAM N/A 01/30/2013   Procedure: LEFT HEART CATHETERIZATION WITH CORONARY ANGIOGRAM;  Surgeon: Larey Dresser, MD;  Location: [facility] CATH LAB;  Service: Cardiovascular;  Laterality: N/A;   PACEMAKER IMPLANT N/A 08/29/2020   Procedure: PACEMAKER IMPLANT;  Surgeon: [name], MD;  Location: [facility] CV LAB;  Service: Cardiovascular;  Laterality: N/A;   ROTATOR CUFF REPAIR     for chronic rotator cuff injury   Patient Active Problem List   Diagnosis Date Noted   Right sided numbness 09/27/2021   Pain in joint of right knee 12/01/2020   Bradycardia 07/29/2020   Second degree AV block, Mobitz type II    Syncope 07/08/2020   Acute midline low back pain without sciatica    Lumbar disc disease with radiculopathy  06/30/2020   Weakness of both lower extremities 06/30/2020   Chronic diastolic HF (heart failure) ([place]) 01/16/2020   Acute exacerbation of CHF (congestive heart failure) ([place]) 08/19/2018   Hypertensive urgency 08/19/2018   Gait disorder 07/13/2018   Recurrent syncope 07/13/2018   E. coli UTI 06/19/2018   Leucocytosis 06/19/2018   Normal pressure hydrocephalus ([place]) 06/01/2018   Altered mental status 05/31/2018   AMS (altered mental status) 05/30/2018   Seizure disorder ([place]) 05/30/2018   Fracture of proximal phalanx of finger fifth 06/17/2017 07/21/2017   Upper airway cough syndrome 11/01/2016   Paroxysmal atrial flutter ([place]) 08/05/2016   Coronary artery disease involving native coronary artery of native heart without angina pectoris 08/05/2016   Chronic  anticoagulation 12/31/2015   History of seizures 19/50/9326   Acute diastolic CHF (congestive heart failure) ([place]) 12/24/2015   Pulmonary edema    Paroxysmal atrial fibrillation ([place])    Hypothyroidism    Controlled type 2 diabetes mellitus with complication, without long-term current use of insulin ([place])    Hypertensive emergency 12/20/2015   Special screening for malignant neoplasms, colon 12/07/2013   Bowel habit changes 12/07/2013   Diarrhea 03/09/2013   History of cardioembolic cerebrovascular accident (CVA) 01/31/2013   Chest pain 01/29/2013   Bilateral carotid artery disease ([place])    Loss of coordination 12/03/2012   Morbid obesity due to excess calories ([place]) 08/20/2011   CAD S/P percutaneous coronary angioplasty    Dyslipidemia    HTN (hypertension)     ONSET DATE: 01/01/2022 (referral)   REFERRING DIAG: R29.898 (ICD-10-CM) - Weakness of both lower extremities   THERAPY DIAG:  Muscle weakness (generalized)  Unsteadiness on feet  Other abnormalities of gait and mobility  Rationale for Evaluation and Treatment Rehabilitation  SUBJECTIVE:                                                                                                                                                                                             SUBJECTIVE STATEMENT: Pt reports his R knee is more bothersome today. No new changes, has been unable to do his exercises because he "cannot get onto the floor and back up".   Pt accompanied by:  self  PERTINENT HISTORY: CAD, HLD, HTN, hypothyroidism, atrial fibrillation, T2DM, CHF, seizures, heart block s/p pacemaker, BPH, CVA   PAIN:  Are you having pain? Yes: NPRS scale: 6/10 Pain location: R knee is painful but whole R LE is stiff, some pain in R hip  Pain description: more numbness and aching   VITALS Today's Vitals   01/22/22 1409  BP: (!) 148/69  Pulse: 62  PRECAUTIONS: Fall and ICD/Pacemaker  WEIGHT BEARING RESTRICTIONS:  No  FALLS: Has patient fallen in last 6 months? Yes. Number of falls 2-3  LIVING ENVIRONMENT: Lives with: lives with their spouse Lives in: House/apartment Stairs: Yes: External: 2 steps; can reach both Has following equipment at home: Single point cane and Grab bars  PLOF: Requires assistive device for independence and Needs assistance with ADLs  PATIENT GOALS: "Only thing I know of is this leg"   OBJECTIVE:   DIAGNOSTIC FINDINGS:  CT of brain in 10/23  IMPRESSION: 1. No acute intracranial pathology. 2. Signs of remote LEFT thalamic infarct and lacunar infarcts in the LEFT basal ganglia also chronic and unchanged. 3. Signs of RIGHT mastoidectomy similar to previous imaging.  MRI Lumbar Spine in 06/2020 Compared to the MRI the lumbar spine performed 06/30/2020, there is no change in the degree of multilevel severe spondylosis. Spinal canal stenosis remains worst at L2-3 and L4-5.   IMPRESSION: 1. Unchanged appearance of hyperintense T2-weighted signal within the endplates at O6-7. No contrast enhancement in the disc space. Findings are most likely degenerative. 2. Unchanged multilevel severe spondylosis with spinal canal stenosis worst at L2-3 and L4-5.  COGNITION: Overall cognitive status: Within functional limits for tasks assessed   EDEMA: Noted edema in R knee located medially. TTP along MCL and medial joint line    TODAY'S TREATMENT Ther Ex  SciFit level 2 for 8 minutes using BUE/BLEs for neural priming for reciprocal movement, dynamic cardiovascular warmup and BLE strength. Pt intermittently letting go w/RUE due to shoulder pain. RPE of 8/10 following activity  Reviewed HEP and updated as appropriate (see bolded below) as pt unable/unsafe to get into floor to perform exercises. Pt performed monster walks well and demonstrated ability to put on/take off red theraband w/min cues to put LLE in first and take LLE out last. Wrote this on pt's HEP to remind him at home.  Also reminded pt to only put band on/off while seated   Ther Act   [facility] PT Assessment - 01/22/22 1429       Ambulation/Gait   Gait velocity 32.8' over 11.34s = 2.61f/s with rollator      Balance   Balance Assessed Yes      Standardized Balance Assessment   Standardized Balance Assessment Timed Up and Go Test      Timed Up and Go Test   Normal TUG (seconds) 25.62   w/rollator             GAIT: Gait pattern:  ER of BLEs and genu valgus of R knee>L knee, step through pattern, decreased step length- Left, decreased stance time- Right, decreased stride length, decreased hip/knee flexion- Right, decreased hip/knee flexion- Left, decreased ankle dorsiflexion- Right, decreased ankle dorsiflexion- Left, decreased trunk rotation, trunk flexed, poor foot clearance- Right, and poor foot clearance- Left Distance walked: Various clinic distances  Assistive device utilized: Walker - 4 wheeled Level of assistance: CGA Comments: Noted significant ER and genu valgus of bilateral knees which improved w/use of theraband to facilitate hip abduction/rotation     PATIENT EDUCATION: Education details: updates to HEP, next appointment time  Person educated: Patient Education method: Explanation, Demonstration, Tactile cues, and Verbal cues Education comprehension: needs further education  HOME EXERCISE PROGRAM: Access Code: [email or web address]: [email or web address] Date: 01/22/2022 Prepared by: JMickie BailPlaster  Exercises (using red band) - Supine Bridge  - 2 x daily - 7 x weekly - 1 sets - 10 reps - 2 hold - Clamshell with Resistance  -  2 x daily - 7 x weekly - 1 sets - 10 reps - 1 hold - Banded Sit to Stand  - 2 x daily - 7 x weekly - 1 sets - 5 reps - Side Stepping with Resistance at Thighs and Counter Support  - 1 x daily - 7 x weekly - 3 sets - 10 reps   GOALS: Goals reviewed with patient? Yes  SHORT TERM GOALS: Target date: 02/03/2022  Pt will perform initial HEP,  including walking program, w/min A from family for improved strength, balance, transfers and gait.  Baseline: not established on eval  Goal status: INITIAL  2.  Pt will improve 5 x STS to less than or equal to 31 seconds w/BUE support to demonstrate improved functional strength and transfer efficiency.   Baseline: 36.12s  Goal status: INITIAL  3.  Pt will improve gait velocity to at least 3.0 ft/s with LRAD for improved gait efficiency   Baseline: 2.89 ft/s w/rollator Goal status: IN PROGRESS  4.  Pt will improve normal TUG to less than or equal to 21 seconds w/LRAD for improved functional mobility and decreased fall risk.  Baseline: 25.62s w/rollator  Goal status: REVISED  5.  Will score at least 34 on the Berg to show reduced fall risk  Baseline: 10/30- 29/56 Goal status: IN PROGRESS  6.  Pt will report <5/10 pain in R knee consistently with activity for improved QOL and pain management  Baseline: frequently >7/1/0  Goal status: INITIAL  LONG TERM GOALS: Target date: 02/24/2022  Pt and family will verbalize and implement plan to continue fitness post-DC, including utilizing [employer] and performing final HEP.  Baseline:  Goal status: INITIAL  2.  Pt will improve 5 x STS to less than or equal to 25 seconds w/BUE support to demonstrate improved functional strength and transfer efficiency.   Baseline: 36.12s w/BUE support  Goal status: INITIAL  3.  Pt will improve normal TUG to less than or equal to 18 seconds for improved functional mobility and decreased fall risk.   Baseline: 25.62s w/rollator  Goal status: REVISED  4.  Will score at least 40/56 on Berg balance test to show reduced fall risk  Baseline:  Goal status: IN PROGRESS  5.  Pt will improve gait velocity to at least 3.36f/s w/LRAD for improved gait efficiency   Baseline: 2.840fs with rollator  Goal status: IN PROGRESS  6.  Pt will report <3/10 pain in R knee w/activity for improved pain  management and QOL  Baseline: frequently >7/10 Goal status: INITIAL  ASSESSMENT:  CLINICAL IMPRESSION: Emphasis of skilled PT session on assessing walking speed/balance and updating HEP to ensure safety at home. Pt performed TUG in ~25s w/rollator, indicative of high fall risk. Noted greatest difficulty w/turning to sit. Pt's gait speed measures at 2.89104f w/rollator, placing him at community ambulation level. Reviewed HEP to ensure pt knows to not get into floor to perform exercises at home, as pt states he had not done any exercises due to not wanting to get on floor. Continue POC.   OBJECTIVE IMPAIRMENTS: Abnormal gait, decreased activity tolerance, decreased endurance, decreased knowledge of condition, decreased mobility, difficulty walking, decreased strength, decreased safety awareness, increased edema, impaired UE functional use, improper body mechanics, postural dysfunction, and pain.   ACTIVITY LIMITATIONS: carrying, lifting, bending, standing, squatting, stairs, transfers, locomotion level, and caring for others  PARTICIPATION LIMITATIONS: meal prep, cleaning, laundry, driving, shopping, community activity, and yard work  PERSONAL FACTORS: Age, Fitness, Past/current  experiences, Time since onset of injury/illness/exacerbation, Transportation, and 1-2 comorbidities: sedentary lifestyle, lack of support at home and history of CVA in 2014  are also affecting patient's functional outcome.   REHAB POTENTIAL: Good  CLINICAL DECISION MAKING: Evolving/moderate complexity  EVALUATION COMPLEXITY: Moderate  PLAN:  PT FREQUENCY: 2x/week  PT DURATION: 6 weeks  PLANNED INTERVENTIONS: Therapeutic exercises, Therapeutic activity, Neuromuscular re-education, Balance training, Gait training, Patient/Family education, Self Care, Joint mobilization, Stair training, Vestibular training, Canalith repositioning, Orthotic/Fit training, DME instructions, Aquatic Therapy, Dry Needling, Electrical  stimulation, Moist heat, Taping, Manual therapy, and Re-evaluation  PLAN FOR NEXT SESSION: balance, strength, functional activity tolerance focus. Hip abductor/ER strength Progressive updates to HEP.    Charlett Nose, PT, [facility] [address] [place], [place]  [address] Phone:  [phone] Fax:  [phone] 01/22/2022, 3:45 PM

## 2022-01-22 NOTE — Telephone Encounter (Signed)
PAIN MEDICATION: Pt's daughter, Jeannene Patella, called asking for a pain medication for her dad's knee. Pam states he fell several months ago in the yard and has had pain off and on since. Thank you.

## 2022-01-25 ENCOUNTER — Encounter: Payer: Self-pay | Admitting: Physical Therapy

## 2022-01-25 ENCOUNTER — Ambulatory Visit: Payer: Medicare Other | Admitting: Physical Therapy

## 2022-01-25 DIAGNOSIS — R2681 Unsteadiness on feet: Secondary | ICD-10-CM | POA: Diagnosis not present

## 2022-01-25 DIAGNOSIS — R2689 Other abnormalities of gait and mobility: Secondary | ICD-10-CM

## 2022-01-25 DIAGNOSIS — Z9181 History of falling: Secondary | ICD-10-CM

## 2022-01-25 DIAGNOSIS — G8929 Other chronic pain: Secondary | ICD-10-CM | POA: Diagnosis not present

## 2022-01-25 DIAGNOSIS — M6281 Muscle weakness (generalized): Secondary | ICD-10-CM

## 2022-01-25 DIAGNOSIS — M25561 Pain in right knee: Secondary | ICD-10-CM | POA: Diagnosis not present

## 2022-01-25 NOTE — Therapy (Signed)
OUTPATIENT PHYSICAL THERAPY NEURO TREATMENT   Patient Name: [name] MRN: [id number] DOB:[birth year], 88 y.o., male Today's Date: 01/25/2022   PCP: [name], MD  REFERRING PROVIDER: [name], MD    PT End of Session - 01/25/22 1607     Visit Number 4    Number of Visits 13    Date for PT Re-Evaluation 03/10/22    Authorization Type BCBS    PT Start Time 1532    PT Stop Time 1611    PT Time Calculation (min) 39 min    Activity Tolerance Patient tolerated treatment well    Behavior During Therapy WFL for tasks assessed/performed                Past Medical History:  Diagnosis Date   Atrial flutter ([place])    BPH (benign prostatic hyperplasia)    Coronary artery disease    a. s/p MI tx with Cypher DES to pRCA in 4/04;  b. Echocardiogram 7/09: Normal LV function.  c. Nuclear study 3/13 no ischemia;  d. ETT 2/14 neg;  e. admx with CP => LHC (01/30/2013):  pLAD 40-50%, oD1 70-80%, oOM1 40%, pRCA stent patent, mid RCA 30%, EF 60-65%. => med Rx.   DDD (degenerative disc disease)    Diabetes mellitus without complication ([place])    Dyslipidemia    Gait disorder 07/13/2018   Hemorrhoids    Hx MRSA infection    left buttocks abscess   Hx of echocardiogram    a. Echocardiogram (01/31/2013): Mild focal basal and mild concentric hypertrophy of the septum, EF 50-55%, normal wall motion, grade 1 diastolic dysfunction, trivial AI, MAC, mild LAE, PASP 35   Hyperlipidemia    Hypertension    Hypothyroidism    Meniere's disease    Status post shunt   Myocardial infarction [facility]) 2008   Occlusion and stenosis of carotid artery without mention of cerebral infarction    40-59% on carotid doppler 2014; [place] (01/2013): R 1-39%, L 60-79%   Rotator cuff injury    chronic rotator cuff injury status post repair   Seizure disorder ([place])    Stroke ([place])    a. 01/2013=> post cardiac cath CVA to L post communicating artery system; R sided weakness   Syncope    Past  Surgical History:  Procedure Laterality Date   COLONOSCOPY     CORONARY ANGIOPLASTY WITH STENT PLACEMENT  07/14/2002   Stent to the right coronary artery   ENDOLYMPHATIC SHUNT DECOMPRESSION  06/11/2009    Right endolymphatic sac decompression and shunt  placement   HERNIA REPAIR  04/10/2008   scrotal hernia repair   LEFT HEART CATHETERIZATION WITH CORONARY ANGIOGRAM N/A 01/30/2013   Procedure: LEFT HEART CATHETERIZATION WITH CORONARY ANGIOGRAM;  Surgeon: Larey Dresser, MD;  Location: [facility] CATH LAB;  Service: Cardiovascular;  Laterality: N/A;   PACEMAKER IMPLANT N/A 08/29/2020   Procedure: PACEMAKER IMPLANT;  Surgeon: [name], MD;  Location: [place] CV LAB;  Service: Cardiovascular;  Laterality: N/A;   ROTATOR CUFF REPAIR     for chronic rotator cuff injury   Patient Active Problem List   Diagnosis Date Noted   Right sided numbness 09/27/2021   Pain in joint of right knee 12/01/2020   Bradycardia 07/29/2020   Second degree AV block, Mobitz type II    Syncope 07/08/2020   Acute midline low back pain without sciatica    Lumbar disc disease with radiculopathy 06/30/2020   Weakness of both lower extremities  06/30/2020   Chronic diastolic HF (heart failure) ([place]) 01/16/2020   Acute exacerbation of CHF (congestive heart failure) ([place]) 08/19/2018   Hypertensive urgency 08/19/2018   Gait disorder 07/13/2018   Recurrent syncope 07/13/2018   E. coli UTI 06/19/2018   Leucocytosis 06/19/2018   Normal pressure hydrocephalus ([place]) 06/01/2018   Altered mental status 05/31/2018   AMS (altered mental status) 05/30/2018   Seizure disorder ([place]) 05/30/2018   Fracture of proximal phalanx of finger fifth 06/17/2017 07/21/2017   Upper airway cough syndrome 11/01/2016   Paroxysmal atrial flutter ([place]) 08/05/2016   Coronary artery disease involving native coronary artery of native heart without angina pectoris 08/05/2016   Chronic anticoagulation 12/31/2015   History of seizures 40/34/7425    Acute diastolic CHF (congestive heart failure) ([place]) 12/24/2015   Pulmonary edema    Paroxysmal atrial fibrillation ([place])    Hypothyroidism    Controlled type 2 diabetes mellitus with complication, without long-term current use of insulin ([place])    Hypertensive emergency 12/20/2015   Special screening for malignant neoplasms, colon 12/07/2013   Bowel habit changes 12/07/2013   Diarrhea 03/09/2013   History of cardioembolic cerebrovascular accident (CVA) 01/31/2013   Chest pain 01/29/2013   Bilateral carotid artery disease ([place])    Loss of coordination 12/03/2012   Morbid obesity due to excess calories ([place]) 08/20/2011   CAD S/P percutaneous coronary angioplasty    Dyslipidemia    HTN (hypertension)     ONSET DATE: 01/01/2022 (referral)   REFERRING DIAG: R29.898 (ICD-10-CM) - Weakness of both lower extremities   THERAPY DIAG:  Muscle weakness (generalized)  Unsteadiness on feet  Other abnormalities of gait and mobility  History of falling  Rationale for Evaluation and Treatment Rehabilitation  SUBJECTIVE:                                                                                                                                                                                             SUBJECTIVE STATEMENT:  Nothing new, I didn't do a lot this weekend but I was a little sore. Watched [place] this weekend   Pt accompanied by:  self  PERTINENT HISTORY: CAD, HLD, HTN, hypothyroidism, atrial fibrillation, T2DM, CHF, seizures, heart block s/p pacemaker, BPH, CVA   PAIN:  Are you having pain? Yes: NPRS scale: 2-3/10 Pain location: R knee and down shin Pain description: "I don't know how to describe it, feels weak"  Aggravating factors: working it Relieving factors: staying off of it  VITALS There were no vitals filed for this visit.   PRECAUTIONS: Fall and ICD/Pacemaker  WEIGHT BEARING RESTRICTIONS: No  FALLS: Has patient fallen  in last 6 months? Yes.  Number of falls 2-3  LIVING ENVIRONMENT: Lives with: lives with their spouse Lives in: House/apartment Stairs: Yes: External: 2 steps; can reach both Has following equipment at home: Single point cane and Grab bars  PLOF: Requires assistive device for independence and Needs assistance with ADLs  PATIENT GOALS: "Only thing I know of is this leg"   OBJECTIVE:   DIAGNOSTIC FINDINGS:  CT of brain in 10/23  IMPRESSION: 1. No acute intracranial pathology. 2. Signs of remote LEFT thalamic infarct and lacunar infarcts in the LEFT basal ganglia also chronic and unchanged. 3. Signs of RIGHT mastoidectomy similar to previous imaging.  MRI Lumbar Spine in 06/2020 Compared to the MRI the lumbar spine performed 06/30/2020, there is no change in the degree of multilevel severe spondylosis. Spinal canal stenosis remains worst at L2-3 and L4-5.   IMPRESSION: 1. Unchanged appearance of hyperintense T2-weighted signal within the endplates at Z6-1. No contrast enhancement in the disc space. Findings are most likely degenerative. 2. Unchanged multilevel severe spondylosis with spinal canal stenosis worst at L2-3 and L4-5.  COGNITION: Overall cognitive status: Within functional limits for tasks assessed   EDEMA: Noted edema in R knee located medially. TTP along MCL and medial joint line    TODAY'S TREATMENT  01/25/22  TherEx  Nustep L4x6 minutes BLEs only  Walking bridges  Sidelying hip ABD HS stretches 3x30 seconds R LE [employer] carry with 10# KB 69f (132fwith KB in R hand, 1877fith KB in L hand)   NMR   Static stance on blue foam pad EO 3x30 seconds narrow BOS Semi-tandem stance blue foam pad EO 2x30 seconds B  Tandem stance in // bars x3 rounds intermittent UE support     GAIT: Gait pattern:  ER of BLEs and genu valgus of R knee>L knee, step through pattern, decreased step length- Left, decreased stance time- Right, decreased stride length, decreased hip/knee flexion-  Right, decreased hip/knee flexion- Left, decreased ankle dorsiflexion- Right, decreased ankle dorsiflexion- Left, decreased trunk rotation, trunk flexed, poor foot clearance- Right, and poor foot clearance- Left Distance walked: Various clinic distances  Assistive device utilized: Walker - 4 wheeled Level of assistance: CGA Comments: Noted significant ER and genu valgus of bilateral knees which improved w/use of theraband to facilitate hip abduction/rotation     PATIENT EDUCATION: Education details: updates to HEP, next appointment time  Person educated: Patient Education method: Explanation, Demonstration, Tactile cues, and Verbal cues Education comprehension: needs further education  HOME EXERCISE PROGRAM: Access Code: [email or web address]: [email or web address] Date: 01/22/2022 Prepared by: JanMickie Bailaster  Exercises (using red band) - Supine Bridge  - 2 x daily - 7 x weekly - 1 sets - 10 reps - 2 hold - Clamshell with Resistance  - 2 x daily - 7 x weekly - 1 sets - 10 reps - 1 hold - Banded Sit to Stand  - 2 x daily - 7 x weekly - 1 sets - 5 reps - Side Stepping with Resistance at Thighs and Counter Support  - 1 x daily - 7 x weekly - 3 sets - 10 reps   GOALS: Goals reviewed with patient? Yes  SHORT TERM GOALS: Target date: 02/03/2022  Pt will perform initial HEP, including walking program, w/min A from family for improved strength, balance, transfers and gait.  Baseline: not established on eval  Goal status: INITIAL  2.  Pt will improve 5 x STS to less than or equal to 31 seconds w/BUE  support to demonstrate improved functional strength and transfer efficiency.   Baseline: 36.12s  Goal status: INITIAL  3.  Pt will improve gait velocity to at least 3.0 ft/s with LRAD for improved gait efficiency   Baseline: 2.89 ft/s w/rollator Goal status: IN PROGRESS  4.  Pt will improve normal TUG to less than or equal to 21 seconds w/LRAD for improved functional mobility and  decreased fall risk.  Baseline: 25.62s w/rollator  Goal status: REVISED  5.  Will score at least 34 on the Berg to show reduced fall risk  Baseline: 10/30- 29/56 Goal status: IN PROGRESS  6.  Pt will report <5/10 pain in R knee consistently with activity for improved QOL and pain management  Baseline: frequently >7/1/0  Goal status: INITIAL  LONG TERM GOALS: Target date: 02/24/2022  Pt and family will verbalize and implement plan to continue fitness post-DC, including utilizing [employer] and performing final HEP.  Baseline:  Goal status: INITIAL  2.  Pt will improve 5 x STS to less than or equal to 25 seconds w/BUE support to demonstrate improved functional strength and transfer efficiency.   Baseline: 36.12s w/BUE support  Goal status: INITIAL  3.  Pt will improve normal TUG to less than or equal to 18 seconds for improved functional mobility and decreased fall risk.   Baseline: 25.62s w/rollator  Goal status: REVISED  4.  Will score at least 40/56 on Berg balance test to show reduced fall risk  Baseline:  Goal status: IN PROGRESS  5.  Pt will improve gait velocity to at least 3.65f/s w/LRAD for improved gait efficiency   Baseline: 2.879fs with rollator  Goal status: IN PROGRESS  6.  Pt will report <3/10 pain in R knee w/activity for improved pain management and QOL  Baseline: frequently >7/10 Goal status: INITIAL  ASSESSMENT:  CLINICAL IMPRESSION:  Mr. LoColaorrives today doing well, felt a bit sore after last session but it was tolerable. Continued focus on strength and balance, fatigued at EOS. Will continue efforts.   OBJECTIVE IMPAIRMENTS: Abnormal gait, decreased activity tolerance, decreased endurance, decreased knowledge of condition, decreased mobility, difficulty walking, decreased strength, decreased safety awareness, increased edema, impaired UE functional use, improper body mechanics, postural dysfunction, and pain.   ACTIVITY  LIMITATIONS: carrying, lifting, bending, standing, squatting, stairs, transfers, locomotion level, and caring for others  PARTICIPATION LIMITATIONS: meal prep, cleaning, laundry, driving, shopping, community activity, and yard work  PERSONAL FACTORS: Age, Fitness, Past/current experiences, Time since onset of injury/illness/exacerbation, Transportation, and 1-2 comorbidities: sedentary lifestyle, lack of support at home and history of CVA in 2014  are also affecting patient's functional outcome.   REHAB POTENTIAL: Good  CLINICAL DECISION MAKING: Evolving/moderate complexity  EVALUATION COMPLEXITY: Moderate  PLAN:  PT FREQUENCY: 2x/week  PT DURATION: 6 weeks  PLANNED INTERVENTIONS: Therapeutic exercises, Therapeutic activity, Neuromuscular re-education, Balance training, Gait training, Patient/Family education, Self Care, Joint mobilization, Stair training, Vestibular training, Canalith repositioning, Orthotic/Fit training, DME instructions, Aquatic Therapy, Dry Needling, Electrical stimulation, Moist heat, Taping, Manual therapy, and Re-evaluation  PLAN FOR NEXT SESSION: balance, strength, functional activity tolerance focus. Hip abductor/ER strength Progressive updates to HEP.      KrAnn LionsT DPT PN2  01/25/2022, 4[id number]uFlying HillsrEaton[place]  [id number]:  [phone]:  [phone]/08/2021, 4:13 PM

## 2022-01-27 ENCOUNTER — Ambulatory Visit: Payer: Medicare Other | Admitting: Physical Therapy

## 2022-01-27 ENCOUNTER — Other Ambulatory Visit: Payer: Self-pay | Admitting: Family Medicine

## 2022-01-27 DIAGNOSIS — G8929 Other chronic pain: Secondary | ICD-10-CM

## 2022-01-27 NOTE — Telephone Encounter (Signed)
Requested Prescriptions  Pending Prescriptions Disp Refills   donepezil (ARICEPT) 5 MG tablet [Pharmacy Med Name: DONEPEZIL '5MG'$  TABLETS] 90 tablet 0    Sig: TAKE 1 TABLET(5 MG) BY MOUTH AT BEDTIME     Neurology:  Alzheimer's Agents Failed - 01/27/2022 11:11 AM      Failed - Valid encounter within last 6 months    Recent Outpatient Visits           8 months ago Essential hypertension   [place], Cammie Mcgee, MD   1 year ago Hematuria, unspecified type   [place] [name], MD   1 year ago Paroxysmal atrial fibrillation [facility])   [place] [name], MD   1 year ago Paroxysmal atrial fibrillation [facility])   [place] Pick[place], Cammie Mcgee, MD   1 year ago Herpes zoster without complication   [place] Eulogio Bear, NP

## 2022-01-27 NOTE — Therapy (Signed)
OUTPATIENT PHYSICAL THERAPY NEURO TREATMENT- ARRIVED NO CHARGE   Patient Name: [name] MRN: [id number] DOB:Jan 01, 1934, 88 y.o., male Today's Date: 01/27/2022   PCP: [name], MD  REFERRING PROVIDER: [name], MD    PT End of Session - 01/27/22 1613     Visit Number 4   Arrived no charge   Number of Visits 13    Date for PT Re-Evaluation 03/10/22    Authorization Type BCBS    PT Start Time 1532    PT Stop Time 9379    PT Time Calculation (min) 41 min    Activity Tolerance Patient limited by pain    Behavior During Therapy [facility] for tasks assessed/performed                Past Medical History:  Diagnosis Date   Atrial flutter ([place])    BPH (benign prostatic hyperplasia)    Coronary artery disease    a. s/p MI tx with Cypher DES to pRCA in 4/04;  b. Echocardiogram 7/09: Normal LV function.  c. Nuclear study 3/13 no ischemia;  d. ETT 2/14 neg;  e. admx with CP => LHC (01/30/2013):  pLAD 40-50%, oD1 70-80%, oOM1 40%, pRCA stent patent, mid RCA 30%, EF 60-65%. => med Rx.   DDD (degenerative disc disease)    Diabetes mellitus without complication ([place])    Dyslipidemia    Gait disorder 07/13/2018   Hemorrhoids    Hx MRSA infection    left buttocks abscess   Hx of echocardiogram    a. Echocardiogram (01/31/2013): Mild focal basal and mild concentric hypertrophy of the septum, EF 50-55%, normal wall motion, grade 1 diastolic dysfunction, trivial AI, MAC, mild LAE, PASP 35   Hyperlipidemia    Hypertension    Hypothyroidism    Meniere's disease    Status post shunt   Myocardial infarction [facility]) 2008   Occlusion and stenosis of carotid artery without mention of cerebral infarction    40-59% on carotid doppler 2014; [place] (01/2013): R 1-39%, L 60-79%   Rotator cuff injury    chronic rotator cuff injury status post repair   Seizure disorder ([place])    Stroke ([place])    a. 01/2013=> post cardiac cath CVA to L post communicating artery system; R sided weakness    Syncope    Past Surgical History:  Procedure Laterality Date   COLONOSCOPY     CORONARY ANGIOPLASTY WITH STENT PLACEMENT  07/14/2002   Stent to the right coronary artery   ENDOLYMPHATIC SHUNT DECOMPRESSION  06/11/2009    Right endolymphatic sac decompression and shunt  placement   HERNIA REPAIR  04/10/2008   scrotal hernia repair   LEFT HEART CATHETERIZATION WITH CORONARY ANGIOGRAM N/A 01/30/2013   Procedure: LEFT HEART CATHETERIZATION WITH CORONARY ANGIOGRAM;  Surgeon: Larey Dresser, MD;  Location: [facility] CATH LAB;  Service: Cardiovascular;  Laterality: N/A;   PACEMAKER IMPLANT N/A 08/29/2020   Procedure: PACEMAKER IMPLANT;  Surgeon: [name], MD;  Location: [facility] CV LAB;  Service: Cardiovascular;  Laterality: N/A;   ROTATOR CUFF REPAIR     for chronic rotator cuff injury   Patient Active Problem List   Diagnosis Date Noted   Right sided numbness 09/27/2021   Pain in joint of right knee 12/01/2020   Bradycardia 07/29/2020   Second degree AV block, Mobitz type II    Syncope 07/08/2020   Acute midline low back pain without sciatica    Lumbar disc disease with radiculopathy 06/30/2020  Weakness of both lower extremities 06/30/2020   Chronic diastolic HF (heart failure) ([place]) 01/16/2020   Acute exacerbation of CHF (congestive heart failure) ([place]) 08/19/2018   Hypertensive urgency 08/19/2018   Gait disorder 07/13/2018   Recurrent syncope 07/13/2018   E. coli UTI 06/19/2018   Leucocytosis 06/19/2018   Normal pressure hydrocephalus ([place]) 06/01/2018   Altered mental status 05/31/2018   AMS (altered mental status) 05/30/2018   Seizure disorder ([place]) 05/30/2018   Fracture of proximal phalanx of finger fifth 06/17/2017 07/21/2017   Upper airway cough syndrome 11/01/2016   Paroxysmal atrial flutter ([place]) 08/05/2016   Coronary artery disease involving native coronary artery of native heart without angina pectoris 08/05/2016   Chronic anticoagulation 12/31/2015   History  of seizures 67/54/4920   Acute diastolic CHF (congestive heart failure) ([place]) 12/24/2015   Pulmonary edema    Paroxysmal atrial fibrillation ([place])    Hypothyroidism    Controlled type 2 diabetes mellitus with complication, without long-term current use of insulin ([place])    Hypertensive emergency 12/20/2015   Special screening for malignant neoplasms, colon 12/07/2013   Bowel habit changes 12/07/2013   Diarrhea 03/09/2013   History of cardioembolic cerebrovascular accident (CVA) 01/31/2013   Chest pain 01/29/2013   Bilateral carotid artery disease ([place])    Loss of coordination 12/03/2012   Morbid obesity due to excess calories ([place]) 08/20/2011   CAD S/P percutaneous coronary angioplasty    Dyslipidemia    HTN (hypertension)     ONSET DATE: 01/01/2022 (referral)   REFERRING DIAG: R29.898 (ICD-10-CM) - Weakness of both lower extremities   THERAPY DIAG:  Chronic pain of right knee  Rationale for Evaluation and Treatment Rehabilitation  SUBJECTIVE:                                                                                                                                                                                             SUBJECTIVE STATEMENT: Pt reports significant R knee pain that makes him "not want to do anything". Pt states he tried to ride his stationary bike earlier today but was so painful he could only tolerate about 5 minutes. " I feel like my leg is gonna give out on me if I stand on it"   Pt accompanied by:  self  PERTINENT HISTORY: CAD, HLD, HTN, hypothyroidism, atrial fibrillation, T2DM, CHF, seizures, heart block s/p pacemaker, BPH, CVA   PAIN:  Are you having pain? Yes: NPRS scale: 8/10 Pain location: R knee and down shin Pain description: "I don't know how to describe it, feels weak"  Aggravating factors: working it Relieving factors: staying off of it  VITALS There were no vitals filed for this visit.   PRECAUTIONS: Fall and  ICD/Pacemaker  WEIGHT BEARING RESTRICTIONS: No  FALLS: Has patient fallen in last 6 months? Yes. Number of falls 2-3  LIVING ENVIRONMENT: Lives with: lives with their spouse Lives in: House/apartment Stairs: Yes: External: 2 steps; can reach both Has following equipment at home: Single point cane and Grab bars  PLOF: Requires assistive device for independence and Needs assistance with ADLs  PATIENT GOALS: "Only thing I know of is this leg"   OBJECTIVE:   DIAGNOSTIC FINDINGS:  CT of brain in 10/23  IMPRESSION: 1. No acute intracranial pathology. 2. Signs of remote LEFT thalamic infarct and lacunar infarcts in the LEFT basal ganglia also chronic and unchanged. 3. Signs of RIGHT mastoidectomy similar to previous imaging.  MRI Lumbar Spine in 06/2020 Compared to the MRI the lumbar spine performed 06/30/2020, there is no change in the degree of multilevel severe spondylosis. Spinal canal stenosis remains worst at L2-3 and L4-5.   IMPRESSION: 1. Unchanged appearance of hyperintense T2-weighted signal within the endplates at P9-5. No contrast enhancement in the disc space. Findings are most likely degenerative. 2. Unchanged multilevel severe spondylosis with spinal canal stenosis worst at L2-3 and L4-5.  COGNITION: Overall cognitive status: Within functional limits for tasks assessed   EDEMA: Noted edema in R knee located medially. TTP along MCL and medial joint line    TODAY'S TREATMENT Ther Act (not billed) Special Tests Performed valgus test to R knee: positive Circumferential edema measurement distal to tibial tuberosity: 37.5cm on RLE and 34 cm on LLE In supine: Anterior drawer test to R knee: negative (but significant crepitus w/minor anterior translation of tibia noted and painful)  Lachman's: negative   Lengthy discussion with pt and daughter, Jeannene Patella, regarding limitations in PT if pt's knee pain is not addressed, as his pain is continuing to worsen as well as his  edema. At this time, recommend pt have imaging (MRI) of R knee to rule out ligamentous injury (pt only had X-ray performed following fall) and pt start pain management regimen in order to improve QOL and return to physical activity, Pt and daughter in agreement and stated they would reach out to pt's PCP and find an orthopedic doctor. Pt in agreement to pause therapy for a few weeks due to unbearable knee pain and inability to obtain pain meds at this time (notified pt and daughter of Tramadol script placed by PCP, daughter unaware he had placed prescription and not listed in pt chart)     GAIT: Gait pattern:  ER of BLEs and genu valgus of R knee>L knee, step through pattern, decreased step length- Left, decreased stance time- Right, decreased stride length, decreased hip/knee flexion- Right, decreased hip/knee flexion- Left, decreased ankle dorsiflexion- Right, decreased ankle dorsiflexion- Left, decreased trunk rotation, trunk flexed, poor foot clearance- Right, and poor foot clearance- Left Distance walked: Various clinic distances  Assistive device utilized: Walker - 4 wheeled Level of assistance: CGA Comments: Noted significant ER and genu valgus of bilateral knees     PATIENT EDUCATION: Education details: See above, plan to pause PT Person educated: Patient Education method: Explanation, Demonstration, Tactile cues, and Verbal cues Education comprehension: needs further education  HOME EXERCISE PROGRAM: Access Code: [email or web address] URL: [email or web address] Date: 01/22/2022 Prepared by: Mickie Bail Emidio Warrell  Exercises (using red band) - Supine Bridge  - 2 x daily - 7 x weekly - 1 sets - 10 reps - 2 hold - Clamshell with Resistance  -  2 x daily - 7 x weekly - 1 sets - 10 reps - 1 hold - Banded Sit to Stand  - 2 x daily - 7 x weekly - 1 sets - 5 reps - Side Stepping with Resistance at Thighs and Counter Support  - 1 x daily - 7 x weekly - 3 sets - 10 reps   GOALS: Goals reviewed  with patient? Yes  SHORT TERM GOALS: Target date: 02/03/2022  Pt will perform initial HEP, including walking program, w/min A from family for improved strength, balance, transfers and gait.  Baseline: not established on eval  Goal status: INITIAL  2.  Pt will improve 5 x STS to less than or equal to 31 seconds w/BUE support to demonstrate improved functional strength and transfer efficiency.   Baseline: 36.12s  Goal status: INITIAL  3.  Pt will improve gait velocity to at least 3.0 ft/s with LRAD for improved gait efficiency   Baseline: 2.89 ft/s w/rollator Goal status: IN PROGRESS  4.  Pt will improve normal TUG to less than or equal to 21 seconds w/LRAD for improved functional mobility and decreased fall risk.  Baseline: 25.62s w/rollator  Goal status: REVISED  5.  Will score at least 34 on the Berg to show reduced fall risk  Baseline: 10/30- 29/56 Goal status: IN PROGRESS  6.  Pt will report <5/10 pain in R knee consistently with activity for improved QOL and pain management  Baseline: frequently >7/1/0  Goal status: INITIAL  LONG TERM GOALS: Target date: 02/24/2022  Pt and family will verbalize and implement plan to continue fitness post-DC, including utilizing [employer] and performing final HEP.  Baseline:  Goal status: INITIAL  2.  Pt will improve 5 x STS to less than or equal to 25 seconds w/BUE support to demonstrate improved functional strength and transfer efficiency.   Baseline: 36.12s w/BUE support  Goal status: INITIAL  3.  Pt will improve normal TUG to less than or equal to 18 seconds for improved functional mobility and decreased fall risk.   Baseline: 25.62s w/rollator  Goal status: REVISED  4.  Will score at least 40/56 on Berg balance test to show reduced fall risk  Baseline:  Goal status: IN PROGRESS  5.  Pt will improve gait velocity to at least 3.45f/s w/LRAD for improved gait efficiency   Baseline: 2.85fs with rollator   Goal status: IN PROGRESS  6.  Pt will report <3/10 pain in R knee w/activity for improved pain management and QOL  Baseline: frequently >7/10 Goal status: INITIAL  ASSESSMENT:  CLINICAL IMPRESSION: Arrived No Charge: Emphasis of skilled PT session on assessing R knee pain and discussing POC moving forward. Pt continues to be limited by R knee pain that worsens w/each therapy session. All special tests to R knee negative except valgus stress test. Encouraged pt to obtain imaging of R knee and start a pain management regimen to better tolerate weightbearing activity and improve QOL. Pt and daughter in agreement to pause PT for at least 2 weeks.   OBJECTIVE IMPAIRMENTS: Abnormal gait, decreased activity tolerance, decreased endurance, decreased knowledge of condition, decreased mobility, difficulty walking, decreased strength, decreased safety awareness, increased edema, impaired UE functional use, improper body mechanics, postural dysfunction, and pain.   ACTIVITY LIMITATIONS: carrying, lifting, bending, standing, squatting, stairs, transfers, locomotion level, and caring for others  PARTICIPATION LIMITATIONS: meal prep, cleaning, laundry, driving, shopping, community activity, and yard work  PERSONAL FACTORS: Age, Fitness, Past/current experiences, Time since onset  of injury/illness/exacerbation, Transportation, and 1-2 comorbidities: sedentary lifestyle, lack of support at home and history of CVA in 2014  are also affecting patient's functional outcome.   REHAB POTENTIAL: Good  CLINICAL DECISION MAKING: Evolving/moderate complexity  EVALUATION COMPLEXITY: Moderate  PLAN:  PT FREQUENCY: 2x/week  PT DURATION: 6 weeks  PLANNED INTERVENTIONS: Therapeutic exercises, Therapeutic activity, Neuromuscular re-education, Balance training, Gait training, Patient/Family education, Self Care, Joint mobilization, Stair training, Vestibular training, Canalith repositioning, Orthotic/Fit training,  DME instructions, Aquatic Therapy, Dry Needling, Electrical stimulation, Moist heat, Taping, Manual therapy, and Re-evaluation  PLAN FOR NEXT SESSION: balance, strength, functional activity tolerance focus. Hip abductor/ER strength Progressive updates to HEP.      Francena Hanly, PT, [place] [address] [place], [place]  [address] Phone:  [phone] Fax:  [phone] 01/27/2022, 8:59 PM

## 2022-01-27 NOTE — Telephone Encounter (Addendum)
Received call from patient's daughter Jeannene Patella to follow up on ongoing knee pain; stated patient's therapist doesn't want patient to have any therapy until after 02/21/22. Therapist thinks something in patient's knee is torn (meniscus?); therapist recommending patient have an MRI. Pain worsening, and knee is swollen.  Patient's daughter requesting a pain medication stronger than traMADol (ULTRAM) 50 MG tablet [[id number]]   Pharmacy confirmed as:  [facility] [phone] - [place], [place] AT [place] [address], [place] [address] Phone: [phone]  Fax: [phone] DEA #: [id number]   Please advise Pam at [phone].

## 2022-01-28 ENCOUNTER — Encounter: Payer: Self-pay | Admitting: Neurology

## 2022-01-28 ENCOUNTER — Telehealth: Payer: Self-pay | Admitting: Pharmacist

## 2022-01-28 ENCOUNTER — Ambulatory Visit: Payer: Medicare Other | Admitting: Neurology

## 2022-01-28 ENCOUNTER — Other Ambulatory Visit: Payer: Self-pay | Admitting: Family Medicine

## 2022-01-28 VITALS — BP 167/78 | HR 60 | Ht 68.0 in | Wt 182.0 lb

## 2022-01-28 DIAGNOSIS — Z5181 Encounter for therapeutic drug level monitoring: Secondary | ICD-10-CM | POA: Diagnosis not present

## 2022-01-28 DIAGNOSIS — G40009 Localization-related (focal) (partial) idiopathic epilepsy and epileptic syndromes with seizures of localized onset, not intractable, without status epilepticus: Secondary | ICD-10-CM | POA: Diagnosis not present

## 2022-01-28 DIAGNOSIS — E785 Hyperlipidemia, unspecified: Secondary | ICD-10-CM

## 2022-01-28 NOTE — Progress Notes (Signed)
Chronic Care Management Pharmacy Assistant   Name: [name]  MRN: [id number] DOB: 10/16/1933   Reason for Encounter: Hypertension Adherence Call    Recent office visits:  01/01/2022 OV( (PCP) [name], MD;  I will increase his Keppra to 500 mg twice daily and then reassess in 7 to 10 days.   Recent consult visits:  None  Hospital visits:  01/05/2022 ED visit for Spell of generalized weakness -No medication changes noted.  Medications: Outpatient Encounter Medications as of 01/28/2022  Medication Sig Note   traMADol (ULTRAM) 50 MG tablet Take 1 tablet (50 mg total) by mouth every 8 (eight) hours as needed for up to 7 days for severe pain (in knee).    acetaminophen (TYLENOL) 325 MG tablet Take 1-2 tablets (325-650 mg total) by mouth every 4 (four) hours as needed for mild pain.    amLODipine (NORVASC) 10 MG tablet Take 1 tablet (10 mg total) by mouth daily.    atorvastatin (LIPITOR) 80 MG tablet TAKE 1 TABLET BY MOUTH EVERY DAY AT 6 PM (Patient taking differently: Take 80 mg by mouth daily. TAKE 1 TABLET BY MOUTH EVERY DAY AT 6 PM)    carbamazepine (CARBATROL) 300 MG 12 hr capsule TAKE 1 CAPSULE(300 MG) BY MOUTH TWICE DAILY (Patient taking differently: Take 300 mg by mouth 2 (two) times daily.)    cloNIDine (CATAPRES) 0.2 MG tablet TAKE 1 TABLET(0.2 MG) BY MOUTH TWICE DAILY (Patient taking differently: Take 0.2 mg by mouth daily.)    donepezil (ARICEPT) 5 MG tablet TAKE 1 TABLET(5 MG) BY MOUTH AT BEDTIME    escitalopram (LEXAPRO) 5 MG tablet TAKE 1 TABLET BY MOUTH EVERY DAY AT BEDTIME    furosemide (LASIX) 20 MG tablet TAKE 1/2 TABLET(10 MG) BY MOUTH DAILY (Patient taking differently: Take 10 mg by mouth daily.)    glucose blood test strip 1 each by Other route as needed. Use as instructed (Patient not taking: Reported on 01/01/2022)    glucose monitoring kit (FREESTYLE) monitoring kit 1 each by Does not apply route as needed. (Patient not taking: Reported on  01/01/2022)    isosorbide mononitrate (IMDUR) 60 MG 24 hr tablet TAKE 1/2 TABLET BY MOUTH EVERY DAY (Patient not taking: Reported on 01/05/2022)    Lancets (ACCU-CHEK SAFE-T PRO) lancets 1 each by Other route as needed. Use as instructed (Patient not taking: Reported on 01/01/2022)    levETIRAcetam (KEPPRA) 250 MG tablet TAKE 1 TABLET(250 MG) BY MOUTH TWICE DAILY (Patient not taking: Reported on 01/05/2022)    levETIRAcetam (KEPPRA) 500 MG tablet Take 1 tablet (500 mg total) by mouth 2 (two) times daily.    levothyroxine (SYNTHROID) 88 MCG tablet Take 1 tablet (88 mcg total) by mouth daily.    losartan (COZAAR) 100 MG tablet TAKE 1/2 TABLET BY MOUTH DAILY (Patient taking differently: Take 100 mg by mouth daily.)    Multiple Vitamins-Minerals (MULTIVITAMINS THER. W/MINERALS) TABS Take 1 tablet by mouth at bedtime.      MYRBETRIQ 50 MG TB24 tablet Take 50 mg by mouth daily. 09/28/2021: Hasn't started   rivaroxaban (XARELTO) 20 MG TABS tablet Take 1 tablet (20 mg total) by mouth daily with supper.    tamsulosin (FLOMAX) 0.4 MG CAPS capsule TAKE 1 CAPSULE(0.4 MG) BY MOUTH DAILY AFTER AND SUPPER (Patient taking differently: Take 0.4 mg by mouth daily after supper.)    No facility-administered encounter medications on file as of 01/28/2022.   Reviewed chart prior to disease state call. Spoke  with patient regarding BP  Recent Office Vitals: BP Readings from Last 3 Encounters:  01/22/22 (!) 148/69  01/05/22 (!) 183/81  01/01/22 124/62   Pulse Readings from Last 3 Encounters:  01/22/22 62  01/05/22 74  01/01/22 66    Wt Readings from Last 3 Encounters:  01/01/22 181 lb (82.1 kg)  10/22/21 179 lb (81.2 kg)  10/15/21 180 lb (81.6 kg)     Kidney Function Lab Results  Component Value Date/Time   CREATININE 1.21 01/05/2022 04:15 PM   CREATININE 1.33 (H) 09/29/2021 01:33 AM   CREATININE 1.19 05/08/2021 11:12 AM   CREATININE 1.04 07/15/2020 03:32 PM   GFRNONAA 58 (L) 01/05/2022 04:15 PM    GFRNONAA 65 07/15/2020 03:32 PM   GFRAA 76 07/15/2020 03:32 PM       Latest Ref Rng & Units 01/05/2022    4:15 PM 09/29/2021    1:33 AM 09/28/2021    2:52 AM  BMP  Glucose 70 - 99 mg/dL 129  99  100   BUN 8 - 23 mg/dL _0 Creatinine 0.61 - 1.24 mg/dL 1.21  1.33  1.13   Sodium 135 - 145 mmol/L 136  135  138   Potassium 3.5 - 5.1 mmol/L 4.9  4.4  4.1   Chloride 98 - 111 mmol/L 101  100  103   CO2 22 - 32 mmol/L _1 Calcium 8.9 - 10.3 mg/dL 8.4  9.1  8.8     Current antihypertensive regimen:  Amlodipine 10 mg daily Clonidine 0.2 mg  twice daily Furosemide 10 mg daily Losartan 100 mg daily  How often are you checking your Blood Pressure? daily  Current home BP readings: 128/56 on 02/03/2022  What recent interventions/DTPs have been made by any provider to improve Blood Pressure control since last CPP Visit: No recent interventions or DTPs.  Any recent hospitalizations or ED visits since last visit with CPP? No  What diet changes have been made to improve Blood Pressure Control?  No recent diet changes.  What exercise is being done to improve your Blood Pressure Control?  Patient has been doing physical therapy. Patient states he recently stopped due to knee pain and swelling.  Adherence Review: Is the patient currently on ACE/ARB medication? Yes Does the patient have >5 day gap between last estimated fill dates? No  Patient states he currently has a head cold and his ears are stopped up.   Patient states he is having some pain in his right knee. Patient states he fell in a whole out in his building about a year ago. Patient states he had to stop doing PT due to pain and swelling. Patient states he would like for [name] to arrange an appointment with a him or a specialist for his knee pain.  Care Gaps: Medicare Annual Wellness: Completed 03/06/2021 Ophthalmology Exam: Overdue since 09/27/2018 Foot Exam: Overdue since 03/19/2020 Hemoglobin A1C: 5.8% on  05/08/2021 Colonoscopy: Completed 12/19/2013  Future Appointments  Date Time Provider [place]  01/28/2022  1:15 PM Alric Ran, MD [facility] None  02/19/2022  1:15 PM Plaster, Cruzita Lederer, PT [facility]  02/23/2022  3:30 PM Bary Richard, PT [facility]  02/26/2022  1:15 PM Plaster, Cruzita Lederer, PT [facility]  03/12/2022 12:00 PM [facility]-NURSE HEALTH ADVISOR [facility]  03/31/2022  7:05 AM [facility] DEVICE REMOTES [facility]  05/06/2022  2:00 PM [facility]-CCM PHARMACIST [facility]  06/30/2022  7:05  AM [facility] DEVICE REMOTES [facility]   Star Rating Drugs: Atorvastatin 80 mg last filled 01/29/2022 90 DS Losartan 100 mg last filled 12/11/2021 90 DS  April D Calhoun, [place] Pharmacist Assistant [phone]

## 2022-01-28 NOTE — Patient Instructions (Addendum)
Continue with Keppra 500 mg BID  Continue with carbamazepine 300 mg twice daily We will check level today with BMP Continue your other medications Follow-up with cardiology to rule out cardiac origin for this episode Follow-up in 6 months or sooner if worse

## 2022-01-28 NOTE — Progress Notes (Signed)
[facility]  PATIENT: [name] DOB: 24-Apr-1933  REQUESTING CLINICIAN: Godfrey Pick, MD HISTORY FROM: Patient, daughter and spouse  REASON FOR VISIT: Seizure   HISTORICAL  CHIEF COMPLAINT:  Chief Complaint  Patient presents with   New Patient (Initial Visit)    Rm 13. Accompanied by wife and daughter. NX Willis/internal referral for spell of weakness, seizure disorder.    HISTORY OF PRESENT ILLNESS:  This is a 88 year old woman male with past medical history including atrial fibrillation on Eliquis, hypertension, hyperlipidemia, previous stroke, seizure disorder, memory decline who is presenting after an episode concerning for seizure.  Patient reports on October 17 after finishing his breakfast he was sitting down having coffee.  He sat there for about an hour.  Then he wanted to get up but did not have any energy, he was confused and felt weak.  He got up, felt dizzy and slid down to the wall.  He did not hit his head.  He was taken to the hospital.  He reported in the past months prior to the 17 of October, he has increased frequency of the syncopal episodes, episodes where he was found unresponsive.  Since the 17 his Keppra has been increased from 250 twice daily to 500 twice daily and he has not had any additional events. Patient reports in the past with his typical seizures started by hearing a song from the [employer].  His seizure always started that way. He has a history of heart disease, also has a pacemaker and reported his pacemaker has been working fine.  He has not seen cardiologist since discharge from the hospital.   Handedness: right   Seizure Type: Hearing dixi chicks singing, another is eyes rolled back and unresponsive  Current frequency: once every 3 months   Any injuries from seizures: None   Seizure risk factors: Previous stroke   Previous ASMs: Carbamazepine and Levetiracetam   Currenty ASMs: Carbamazepine 300 mg BID,  Levetiracetam 500 mg BID  ASMs side effects: None   Brain Images: Remotes strokes   Previous EEGs: Gen diffuse slowing    OTHER MEDICAL CONDITIONS: Seizure disorder, atrial fibrillation, hypertension, hyperlipidemia, memory decline, hypothyroidism,   REVIEW OF SYSTEMS: Full 14 system review of systems performed and negative with exception of: As noted in the HPI   ALLERGIES: Allergies  Allergen Reactions   Penicillins     Did it involve swelling of the face/tongue/throat, SOB, or low BP? Unknown Did it involve sudden or severe rash/hives, skin peeling, or any reaction on the inside of your mouth or nose? Unknown Did you need to seek medical attention at a hospital or doctor's office? No When did it last happen?      Decades Ago If all above answers are "NO", may proceed with cephalosporin use.      HOME MEDICATIONS: Outpatient Medications Prior to Visit  Medication Sig Dispense Refill   acetaminophen (TYLENOL) 325 MG tablet Take 1-2 tablets (325-650 mg total) by mouth every 4 (four) hours as needed for mild pain.     carbamazepine (CARBATROL) 300 MG 12 hr capsule TAKE 1 CAPSULE(300 MG) BY MOUTH TWICE DAILY (Patient taking differently: Take 300 mg by mouth 2 (two) times daily.) 180 capsule 3   cloNIDine (CATAPRES) 0.2 MG tablet TAKE 1 TABLET(0.2 MG) BY MOUTH TWICE DAILY (Patient taking differently: Take 0.2 mg by mouth daily.) 180 tablet 3   donepezil (ARICEPT) 5 MG tablet TAKE 1 TABLET(5 MG) BY MOUTH AT BEDTIME 90 tablet  0   escitalopram (LEXAPRO) 5 MG tablet TAKE 1 TABLET BY MOUTH EVERY DAY AT BEDTIME 90 tablet 0   furosemide (LASIX) 20 MG tablet TAKE 1/2 TABLET(10 MG) BY MOUTH DAILY (Patient taking differently: Take 10 mg by mouth daily.) 45 tablet 0   glucose blood test strip 1 each by Other route as needed. Use as instructed 100 each 3   glucose monitoring kit (FREESTYLE) monitoring kit 1 each by Does not apply route as needed. 1 each 0   isosorbide mononitrate (IMDUR) 60 MG  24 hr tablet TAKE 1/2 TABLET BY MOUTH EVERY DAY 90 tablet 3   Lancets (ACCU-CHEK SAFE-T PRO) lancets 1 each by Other route as needed. Use as instructed 100 each 3   levETIRAcetam (KEPPRA) 500 MG tablet Take 1 tablet (500 mg total) by mouth 2 (two) times daily. 60 tablet 6   levothyroxine (SYNTHROID) 88 MCG tablet Take 1 tablet (88 mcg total) by mouth daily. 90 tablet 3   losartan (COZAAR) 100 MG tablet TAKE 1/2 TABLET BY MOUTH DAILY (Patient taking differently: Take 100 mg by mouth daily.) 90 tablet 1   Multiple Vitamins-Minerals (MULTIVITAMINS THER. W/MINERALS) TABS Take 1 tablet by mouth at bedtime.       MYRBETRIQ 50 MG TB24 tablet Take 50 mg by mouth daily.     rivaroxaban (XARELTO) 20 MG TABS tablet Take 1 tablet (20 mg total) by mouth daily with supper. 90 tablet 3   tamsulosin (FLOMAX) 0.4 MG CAPS capsule TAKE 1 CAPSULE(0.4 MG) BY MOUTH DAILY AFTER AND SUPPER (Patient taking differently: Take 0.4 mg by mouth daily after supper.) 90 capsule 3   traMADol (ULTRAM) 50 MG tablet Take 1 tablet (50 mg total) by mouth every 8 (eight) hours as needed for up to 7 days for severe pain (in knee). (Patient not taking: Reported on 01/28/2022) 20 tablet 0   atorvastatin (LIPITOR) 80 MG tablet TAKE 1 TABLET BY MOUTH EVERY DAY AT 6 PM (Patient taking differently: Take 80 mg by mouth daily. TAKE 1 TABLET BY MOUTH EVERY DAY AT 6 PM) 90 tablet 3   amLODipine (NORVASC) 10 MG tablet Take 1 tablet (10 mg total) by mouth daily. (Patient not taking: Reported on 01/28/2022) 90 tablet 3   levETIRAcetam (KEPPRA) 250 MG tablet TAKE 1 TABLET(250 MG) BY MOUTH TWICE DAILY (Patient not taking: Reported on 01/05/2022) 180 tablet 1   No facility-administered medications prior to visit.    PAST MEDICAL HISTORY: Past Medical History:  Diagnosis Date   Atrial flutter ([place])    BPH (benign prostatic hyperplasia)    Coronary artery disease    a. s/p MI tx with Cypher DES to pRCA in 4/04;  b. Echocardiogram 7/09: Normal LV  function.  c. Nuclear study 3/13 no ischemia;  d. ETT 2/14 neg;  e. admx with CP => LHC (01/30/2013):  pLAD 40-50%, oD1 70-80%, oOM1 40%, pRCA stent patent, mid RCA 30%, EF 60-65%. => med Rx.   DDD (degenerative disc disease)    Diabetes mellitus without complication ([place])    Dyslipidemia    Gait disorder 07/13/2018   Hemorrhoids    Hx MRSA infection    left buttocks abscess   Hx of echocardiogram    a. Echocardiogram (01/31/2013): Mild focal basal and mild concentric hypertrophy of the septum, EF 50-55%, normal wall motion, grade 1 diastolic dysfunction, trivial AI, MAC, mild LAE, PASP 35   Hyperlipidemia    Hypertension    Hypothyroidism    Meniere's disease  Status post shunt   Myocardial infarction [facility]) 2008   Occlusion and stenosis of carotid artery without mention of cerebral infarction    40-59% on carotid doppler 2014; [place] (01/2013): R 1-39%, L 60-79%   Rotator cuff injury    chronic rotator cuff injury status post repair   Seizure disorder ([place])    Stroke ([place])    a. 01/2013=> post cardiac cath CVA to L post communicating artery system; R sided weakness   Syncope     PAST SURGICAL HISTORY: Past Surgical History:  Procedure Laterality Date   COLONOSCOPY     CORONARY ANGIOPLASTY WITH STENT PLACEMENT  07/14/2002   Stent to the right coronary artery   ENDOLYMPHATIC SHUNT DECOMPRESSION  06/11/2009    Right endolymphatic sac decompression and shunt  placement   HERNIA REPAIR  04/10/2008   scrotal hernia repair   LEFT HEART CATHETERIZATION WITH CORONARY ANGIOGRAM N/A 01/30/2013   Procedure: LEFT HEART CATHETERIZATION WITH CORONARY ANGIOGRAM;  Surgeon: Larey Dresser, MD;  Location: [facility] C[facility] LAB;  Service: Cardiovascular;  Laterality: N/A;   PACEMAKER IMPLANT N/A 08/29/2020   Procedure: PACEMAKER IMPLANT;  Surgeon: [name], MD;  Location: [place] CV LAB;  Service: Cardiovascular;  Laterality: N/A;   ROTATOR CUFF REPAIR     for chronic rotator cuff injury     FAMILY HISTORY: Family History  Problem Relation Age of Onset   Heart attack Father 77   Aneurysm Mother        brain aneurysm   Coronary artery disease Brother        with CABG   Diabetes Brother    Colon cancer Neg Hx    Colon polyps Neg Hx     SOCIAL HISTORY: Social History   Socioeconomic History   Marital status: Married    Spouse name: Not on file   Number of children: 4   Years of education: 12th   Highest education level: Not on file  Occupational History   Occupation: Retired  Tobacco Use   Smoking status: Former    Years: 5.00    Types: Cigarettes    Quit date: 08/19/1956    Years since quitting: 65.4   Smokeless tobacco: Former    Types: Nurse, children's Use: Never used  Substance and Sexual Activity   Alcohol use: No   Drug use: No   Sexual activity: Never  Other Topics Concern   Not on file  Social History Narrative   Lives with wife.   Children live nearby and help with care.    Social Determinants of Health   Financial Resource Strain: Low Risk  (03/06/2021)   Overall Financial Resource Strain (CARDIA)    Difficulty of Paying Living Expenses: Not hard at all  Food Insecurity: No Food Insecurity (03/06/2021)   Hunger Vital Sign    Worried About Running Out of Food in the Last Year: Never true    Ran Out of Food in the Last Year: Never true  Transportation Needs: No Transportation Needs (03/06/2021)   PRAPARE - Hydrologist (Medical): No    Lack of Transportation (Non-Medical): No  Physical Activity: Insufficiently Active (03/06/2021)   Exercise Vital Sign    Days of Exercise per Week: 3 days    Minutes of Exercise per Session: 20 min  Stress: No Stress Concern Present (03/06/2021)   Friendly    Feeling of Stress :  Not at all  Social Connections: Socially Integrated (03/06/2021)   Social Connection and Isolation Panel [NHANES]     Frequency of Communication with Friends and Family: More than three times a week    Frequency of Social Gatherings with Friends and Family: More than three times a week    Attends Religious Services: 1 to 4 times per year    Active Member of [employer] or Organizations: Yes    Attends Archivist Meetings: 1 to 4 times per year    Marital Status: Married  Human resources officer Violence: Not At Risk (03/06/2021)   Humiliation, Afraid, Rape, and Kick questionnaire    Fear of Current or Ex-Partner: No    Emotionally Abused: No    Physically Abused: No    Sexually Abused: No    PHYSICAL EXAM  GENERAL EXAM/CONSTITUTIONAL: Vitals:  Vitals:   01/28/22 1314  BP: (!) 167/78  Pulse: 60  Weight: 182 lb (82.6 kg)  Height: _0  (1.727 m)   Body mass index is 27.67 kg/m. Wt Readings from Last 3 Encounters:  01/28/22 182 lb (82.6 kg)  01/01/22 181 lb (82.1 kg)  10/22/21 179 lb (81.2 kg)   Patient is in no distress; well developed, nourished and groomed; neck is supple  EYES: Pupils round and reactive to light, Visual fields full to confrontation, Extraocular movements intacts,  No results found.  MUSCULOSKELETAL: Gait, strength, tone, movements noted in Neurologic exam below  NEUROLOGIC: MENTAL STATUS:     10/17/2018    1:04 PM  MMSE - Mini Mental State Exam  Orientation to time 5  Orientation to Place 5  Registration 3  Attention/ Calculation 5  Recall 2  Language- name 2 objects 2  Language- repeat 0  Language- follow 3 step command 3  Language- read & follow direction 1  Write a sentence 1  Copy design 0  Total score 27   awake, alert, oriented to person, place and time recent and remote memory intact normal attention and concentration language fluent, comprehension intact, naming intact fund of knowledge appropriate  CRANIAL NERVE:  2nd, 3rd, 4th, 6th - pupils equal and reactive to light, visual fields full to confrontation, extraocular muscles intact, no  nystagmus 5th - facial sensation symmetric 7th - facial strength symmetric 8th - hearing intact 9th - palate elevates symmetrically, uvula midline 11th - shoulder shrug symmetric 12th - tongue protrusion midline  MOTOR:  normal bulk and tone, full strength in the BUE, BLE  SENSORY:  normal and symmetric to light touch  COORDINATION:  finger-nose-finger, fine finger movements normal   GAIT/STATION:  Unsteady, wide base     DIAGNOSTIC DATA (LABS, IMAGING, TESTING) - I reviewed patient records, labs, notes, testing and imaging myself where available.  Lab Results  Component Value Date   WBC 6.2 01/05/2022   HGB 10.3 (L) 01/05/2022   HCT 31.3 (L) 01/05/2022   MCV 97.5 01/05/2022   PLT 258 01/05/2022      Component Value Date/Time   NA 139 01/28/2022 1340   K 4.4 01/28/2022 1340   CL 99 01/28/2022 1340   CO2 23 01/28/2022 1340   GLUCOSE 103 (H) 01/28/2022 1340   GLUCOSE 129 (H) 01/05/2022 1615   BUN 21 01/28/2022 1340   CREATININE 1.01 01/28/2022 1340   CREATININE 1.19 05/08/2021 1112   CALCIUM 9.3 01/28/2022 1340   PROT 6.2 (L) 01/05/2022 1615   ALBUMIN 3.7 01/05/2022 1615   AST 20 01/05/2022 1615   ALT 16 01/05/2022 1615  ALKPHOS 79 01/05/2022 1615   BILITOT 0.3 01/05/2022 1615   GFRNONAA 58 (L) 01/05/2022 1615   GFRNONAA 65 07/15/2020 1532   GFRAA 76 07/15/2020 1532   Lab Results  Component Value Date   CHOL 193 05/08/2021   HDL 78 05/08/2021   LDLCALC 99 05/08/2021   TRIG 75 05/08/2021   Lab Results  Component Value Date   HGBA1C 5.8 (H) 05/08/2021   Lab Results  Component Value Date   VKPQAESL75 300 06/01/2018   Lab Results  Component Value Date   TSH 2.959 01/05/2022   Head CT 01/05/2022 1. No acute intracranial pathology. 2. Signs of remote LEFT thalamic infarct and lacunar infarcts in the LEFT basal ganglia also chronic and unchanged. 3. Signs of RIGHT mastoidectomy similar to previous imaging.   MRI Brain 09/29/2021 1. No acute  intracranial abnormality. 2. Remote infarcts in the left thalamus, left occipital lobe and tiny remote infarcts in the bilateral basal ganglia. 3. Stable mild chronic microvascular ischemic changes of the white matter. 4. Moderate parenchymal volume loss, also unchanged.   EEG 07/09/21 - Intermittent slow, generalized   IMPRESSION: This study is suggestive of mild diffuse encephalopathy, nonspecific etiology. No seizures or epileptiform discharges were seen throughout the recording.  I personally reviewed brain Images and previous EEG reports.   ASSESSMENT AND PLAN  88 y.o. year old male  with history including atrial fibrillation on Eliquis, hypertension, hyperlipidemia, previous stroke, seizure disorder, memory decline who is presenting after an episode concerning for seizure.  This episode has been described as feeling weak, and passing out which is different from his typical seizure where he used to hear a song from the [employer] prior to the seizure. Since the 17 his Keppra has been increased from 250 twice daily to 500 mg twice daily and he has not had any additional events.  I will obtain antiseizure level today, continue patient on current medications which are Keppra 500 mg p.o. twice daily and carbamazepine 300 mg twice daily.  I will also advise him to follow-up with cardiology to have his pacemaker interrogated.  Continue to follow-up with PCP return in 6 months or sooner if worse    1. Partial idiopathic epilepsy with seizures of localized onset, not intractable, without status epilepticus ([place])   2. Therapeutic drug monitoring     Patient Instructions  Continue with Keppra 500 mg BID  Continue with carbamazepine 300 mg twice daily We will check level today with BMP Continue your other medications Follow-up with cardiology to rule out cardiac origin for this episode Follow-up in 6 months or sooner if worse   Per [facility] statutes, patients with seizures are  not allowed to drive until they have been seizure-free for six months.  Other recommendations include using caution when using heavy equipment or power tools. Avoid working on ladders or at heights. Take showers instead of baths.  Do not swim alone.  Ensure the water temperature is not too high on the home water heater. Do not go swimming alone. Do not lock yourself in a room alone (i.e. bathroom). When caring for infants or small children, sit down when holding, feeding, or changing them to minimize risk of injury to the child in the event you have a seizure. Maintain good sleep hygiene. Avoid alcohol.  Also recommend adequate sleep, hydration, good diet and minimize stress.   During the Seizure  - First, ensure adequate ventilation and place patients on the floor on their left side  Loosen clothing around the neck and ensure the airway is patent. If the patient is clenching the teeth, do not force the mouth open with any object as this can cause severe damage - Remove all items from the surrounding that can be hazardous. The patient may be oblivious to what's happening and may not even know what he or she is doing. If the patient is confused and wandering, either gently guide him/her away and block access to outside areas - Reassure the individual and be comforting - Call 911. In most cases, the seizure ends before EMS arrives. However, there are cases when seizures may last over 3 to 5 minutes. Or the individual may have developed breathing difficulties or severe injuries. If a pregnant patient or a person with diabetes develops a seizure, it is prudent to call an ambulance. - Finally, if the patient does not regain full consciousness, then call EMS. Most patients will remain confused for about 45 to 90 minutes after a seizure, so you must use judgment in calling for help. - Avoid restraints but make sure the patient is in a bed with padded side rails - Place the individual in a lateral position with  the neck slightly flexed; this will help the saliva drain from the mouth and prevent the tongue from falling backward - Remove all nearby furniture and other hazards from the area - Provide verbal assurance as the individual is regaining consciousness - Provide the patient with privacy if possible - Call for help and start treatment as ordered by the caregiver   After the Seizure (Postictal Stage)  After a seizure, most patients experience confusion, fatigue, muscle pain and/or a headache. Thus, one should permit the individual to sleep. For the next few days, reassurance is essential. Being calm and helping reorient the person is also of importance.  Most seizures are painless and end spontaneously. Seizures are not harmful to others but can lead to complications such as stress on the lungs, brain and the heart. Individuals with prior lung problems may develop labored breathing and respiratory distress.     Orders Placed This Encounter  Procedures   Levetiracetam level   Carbamazepine level, total   Basic Metabolic Panel    No orders of the defined types were placed in this encounter.   Return in about 6 months (around 07/29/2022).    Alric Ran, MD 01/30/2022, 7:54 AM  [facility] [address], [place], [place] [address] [phone]

## 2022-01-28 NOTE — Telephone Encounter (Signed)
Requested medication (s) are due for refill today -expired Rx  Requested medication (s) are on the active medication list -yes  Future visit scheduled -no  Last refill: 01/19/21 #90 3RF  Notes to clinic: expired Rx  Requested Prescriptions  Pending Prescriptions Disp Refills   atorvastatin (LIPITOR) 80 MG tablet [Pharmacy Med Name: ATORVASTATIN '80MG'$  TABLETS] 90 tablet 3    Sig: TAKE 1 TABLET BY MOUTH EVERY DAY AT 6 PM     Cardiovascular:  Antilipid - Statins Failed - 01/28/2022  9:26 AM      Failed - Lipid Panel in normal range within the last 12 months    Cholesterol  Date Value Ref Range Status  05/08/2021 193 <200 mg/dL Final   LDL Cholesterol (Calc)  Date Value Ref Range Status  05/08/2021 99 mg/dL (calc) Final    Comment:    Reference range: <100 . Desirable range <100 mg/dL for primary prevention;   <70 mg/dL for patients with CHD or diabetic patients  with > or = 2 CHD risk factors. Marland Kitchen LDL-C is now calculated using the Martin-Hopkins  calculation, which is a validated novel method providing  better accuracy than the Friedewald equation in the  estimation of LDL-C.  Cresenciano Genre et al. Annamaria Helling. 6967;893(81): 2061-2068  (http://education.[facility].com/faq/FAQ164)    HDL  Date Value Ref Range Status  05/08/2021 78 > OR = 40 mg/dL Final   Triglycerides  Date Value Ref Range Status  05/08/2021 75 <150 mg/dL Final         Passed - Patient is not pregnant      Passed - Valid encounter within last 12 months    Recent Outpatient Visits           8 months ago Essential hypertension   [place], Cammie Mcgee, MD   1 year ago Hematuria, unspecified type   [place] [name], MD   1 year ago Paroxysmal atrial fibrillation [facility])   [place], Cammie Mcgee, MD   1 year ago Paroxysmal atrial fibrillation [facility])   [place], Cammie Mcgee, MD   1 year ago Herpes zoster  without complication   [place] Eulogio Bear, NP                 Requested Prescriptions  Pending Prescriptions Disp Refills   atorvastatin (LIPITOR) 80 MG tablet [Pharmacy Med Name: ATORVASTATIN '80MG'$  TABLETS] 90 tablet 3    Sig: TAKE 1 TABLET BY MOUTH EVERY DAY AT 6 PM     Cardiovascular:  Antilipid - Statins Failed - 01/28/2022  9:26 AM      Failed - Lipid Panel in normal range within the last 12 months    Cholesterol  Date Value Ref Range Status  05/08/2021 193 <200 mg/dL Final   LDL Cholesterol (Calc)  Date Value Ref Range Status  05/08/2021 99 mg/dL (calc) Final    Comment:    Reference range: <100 . Desirable range <100 mg/dL for primary prevention;   <70 mg/dL for patients with CHD or diabetic patients  with > or = 2 CHD risk factors. Marland Kitchen LDL-C is now calculated using the Martin-Hopkins  calculation, which is a validated novel method providing  better accuracy than the Friedewald equation in the  estimation of LDL-C.  Cresenciano Genre et al. Annamaria Helling. 0175;102(58): 2061-2068  (http://education.[facility].com/faq/FAQ164)    HDL  Date Value Ref Range Status  05/08/2021 78 > OR = 40  mg/dL Final   Triglycerides  Date Value Ref Range Status  05/08/2021 75 <150 mg/dL Final         Passed - Patient is not pregnant      Passed - Valid encounter within last 12 months    Recent Outpatient Visits           8 months ago Essential hypertension   [place], Cammie Mcgee, MD   1 year ago Hematuria, unspecified type   [place] [name], MD   1 year ago Paroxysmal atrial fibrillation [facility])   [place] [name], MD   1 year ago Paroxysmal atrial fibrillation [facility])   [place] [name], MD   1 year ago Herpes zoster without complication   [place] Eulogio Bear, NP

## 2022-01-29 LAB — BASIC METABOLIC PANEL
BUN/Creatinine Ratio: 21 (ref 10–24)
BUN: 21 mg/dL (ref 8–27)
CO2: 23 mmol/L (ref 20–29)
Calcium: 9.3 mg/dL (ref 8.6–10.2)
Chloride: 99 mmol/L (ref 96–106)
Creatinine, Ser: 1.01 mg/dL (ref 0.76–1.27)
Glucose: 103 mg/dL — ABNORMAL HIGH (ref 70–99)
Potassium: 4.4 mmol/L (ref 3.5–5.2)
Sodium: 139 mmol/L (ref 134–144)
eGFR: 72 mL/min/{1.73_m2} (ref >59)

## 2022-01-29 LAB — CARBAMAZEPINE LEVEL, TOTAL: Carbamazepine (Tegretol), S: 12.8 ug/mL (ref 4.0–12.0)

## 2022-01-29 LAB — LEVETIRACETAM LEVEL: Levetiracetam Lvl: 25.1 ug/mL (ref 10.0–40.0)

## 2022-01-29 NOTE — Telephone Encounter (Signed)
Spoke with patient's daughter; stated he also has an orthopedic specialist who may be able to order the MRI. She will call back after she clarifies which doctor patient will use for MRI order/referral.

## 2022-02-01 ENCOUNTER — Telehealth: Payer: Self-pay

## 2022-02-01 ENCOUNTER — Ambulatory Visit: Payer: Medicare Other | Admitting: Physical Therapy

## 2022-02-01 NOTE — Telephone Encounter (Signed)
I called patient.  I spoke with patient's daughter, Maudie Mercury, per [facility].  I discussed patient's recent lab results and recommendations.  Patient's daughter verbalized understanding and appreciation.

## 2022-02-01 NOTE — Progress Notes (Signed)
Please call and advise the patient (he doesnt check Mychart) that the recent labs we checked were within normal limits. His Tegretol level was slightly high but nothing to worry about. No further action is required on these tests at this time. Please remind patient to keep any upcoming appointments or tests and to call us with any interim questions, concerns, problems or updates. Thanks,   Alric Ran, MD

## 2022-02-01 NOTE — Telephone Encounter (Signed)
-----   Message from Alric Ran, MD sent at 02/01/2022 11:15 AM EST ----- Please call and advise the patient (he doesnt check Mychart) that the recent labs we checked were within normal limits. His Tegretol level was slightly high but nothing to worry about. No further action is required on these tests at this time. Please remind patient to keep any upcoming appointments or tests and to call us with any interim questions, concerns, problems or updates. Thanks,   Alric Ran, MD

## 2022-02-03 NOTE — Telephone Encounter (Signed)
Patient's daughter called back; nurse advised patient to have orthopedic specialist order MRI. She verbalized understanding.  Nothing further needed at this time.

## 2022-02-05 ENCOUNTER — Ambulatory Visit: Payer: Medicare Other | Admitting: Physical Therapy

## 2022-02-08 ENCOUNTER — Ambulatory Visit: Payer: Medicare Other | Admitting: Physical Therapy

## 2022-02-09 ENCOUNTER — Other Ambulatory Visit: Payer: Self-pay | Admitting: Family Medicine

## 2022-02-10 ENCOUNTER — Ambulatory Visit: Payer: Medicare Other | Admitting: Physical Therapy

## 2022-02-15 ENCOUNTER — Ambulatory Visit: Payer: Medicare Other | Admitting: Physical Therapy

## 2022-02-16 ENCOUNTER — Ambulatory Visit (INDEPENDENT_AMBULATORY_CARE_PROVIDER_SITE_OTHER): Payer: Medicare Other | Admitting: Family Medicine

## 2022-02-16 ENCOUNTER — Encounter: Payer: Self-pay | Admitting: Family Medicine

## 2022-02-16 VITALS — BP 140/60 | HR 68 | Temp 97.5°F | Ht 68.0 in | Wt 181.0 lb

## 2022-02-16 DIAGNOSIS — J019 Acute sinusitis, unspecified: Secondary | ICD-10-CM

## 2022-02-16 DIAGNOSIS — B9689 Other specified bacterial agents as the cause of diseases classified elsewhere: Secondary | ICD-10-CM | POA: Diagnosis not present

## 2022-02-16 MED ORDER — DOXYCYCLINE HYCLATE 100 MG PO TABS: 100.0000 mg | ORAL_TABLET | Freq: Two times a day (BID) | ORAL | 0 refills | Status: DC

## 2022-02-16 NOTE — Assessment & Plan Note (Signed)
Patients symptoms are consistent with bacterial rhinosinusitis given duration of illness as well as presence of sinus pressure and pain and mucopurulent nasal congestion. Start Doxycycline '100mg'$  BID for 10 days and continue OTC supportive treatment. Encouraged rest and hydration, flonase, and Coricidin. Return to office if symptoms persist or worsen.

## 2022-02-16 NOTE — Progress Notes (Signed)
Acute Office Visit  Subjective:     Patient ID: [name], male    DOB: 21-Oct-1933, 88 y.o.   MRN: [id number]  Chief Complaint  Patient presents with   Follow-up    head and ear congestion, slight cough, no fever    HPI Patient is in today for 11 days of head and ear congestion, mild cough, fatigue, sinus pressure. Denies fever, sore throat, shortness of breath, wheezing, chest pain Sick exposures unknown Symptoms overall worsening Has tried unknown OTC medication.    Review of Systems  Reason unable to perform ROS: see HPI.  All other systems reviewed and are negative.       Objective:    BP (!) 140/60   Pulse 68   Temp (!) 97.5 F (36.4 C) (Oral)   Ht _0  (1.727 m)   Wt 181 lb (82.1 kg)   SpO2 97%   BMI 27.52 kg/m    Physical Exam Vitals and nursing note reviewed.  Constitutional:      Appearance: Normal appearance. He is normal weight.  HENT:     Head: Normocephalic and atraumatic.     Right Ear: There is impacted cerumen.     Left Ear: There is impacted cerumen.     Nose: Nose normal.     Mouth/Throat:     Mouth: Mucous membranes are moist.     Pharynx: Oropharynx is clear.  Eyes:     Extraocular Movements: Extraocular movements intact.     Conjunctiva/sclera: Conjunctivae normal.  Cardiovascular:     Rate and Rhythm: Normal rate and regular rhythm.     Pulses: Normal pulses.     Heart sounds: Normal heart sounds.  Pulmonary:     Effort: Pulmonary effort is normal.     Breath sounds: Normal breath sounds.  Skin:    General: Skin is warm and dry.  Neurological:     General: No focal deficit present.     Mental Status: He is alert and oriented to person, place, and time. Mental status is at baseline.  Psychiatric:        Mood and Affect: Mood normal.        Behavior: Behavior normal.        Thought Content: Thought content normal.        Judgment: Judgment normal.     No results found for any visits on 02/16/22.  Past Medical  History:  Diagnosis Date   Atrial flutter ([place])    BPH (benign prostatic hyperplasia)    Coronary artery disease    a. s/p MI tx with Cypher DES to pRCA in 4/04;  b. Echocardiogram 7/09: Normal LV function.  c. Nuclear study 3/13 no ischemia;  d. ETT 2/14 neg;  e. admx with CP => LHC (01/30/2013):  pLAD 40-50%, oD1 70-80%, oOM1 40%, pRCA stent patent, mid RCA 30%, EF 60-65%. => med Rx.   DDD (degenerative disc disease)    Diabetes mellitus without complication ([place])    Dyslipidemia    Gait disorder 07/13/2018   Hemorrhoids    Hx MRSA infection    left buttocks abscess   Hx of echocardiogram    a. Echocardiogram (01/31/2013): Mild focal basal and mild concentric hypertrophy of the septum, EF 50-55%, normal wall motion, grade 1 diastolic dysfunction, trivial AI, MAC, mild LAE, PASP 35   Hyperlipidemia    Hypertension    Hypothyroidism    Meniere's disease    Status post shunt   Myocardial infarction (  [place]) 2008   Occlusion and stenosis of carotid artery without mention of cerebral infarction    40-59% on carotid doppler 2014; [place] (01/2013): R 1-39%, L 60-79%   Rotator cuff injury    chronic rotator cuff injury status post repair   Seizure disorder ([place])    Stroke ([place])    a. 01/2013=> post cardiac cath CVA to L post communicating artery system; R sided weakness   Syncope    Past Surgical History:  Procedure Laterality Date   COLONOSCOPY     CORONARY ANGIOPLASTY WITH STENT PLACEMENT  07/14/2002   Stent to the right coronary artery   ENDOLYMPHATIC SHUNT DECOMPRESSION  06/11/2009    Right endolymphatic sac decompression and shunt  placement   HERNIA REPAIR  04/10/2008   scrotal hernia repair   LEFT HEART CATHETERIZATION WITH CORONARY ANGIOGRAM N/A 01/30/2013   Procedure: LEFT HEART CATHETERIZATION WITH CORONARY ANGIOGRAM;  Surgeon: Larey Dresser, MD;  Location: [facility] CATH LAB;  Service: Cardiovascular;  Laterality: N/A;   PACEMAKER IMPLANT N/A 08/29/2020   Procedure: PACEMAKER  IMPLANT;  Surgeon: [name], MD;  Location: [place] CV LAB;  Service: Cardiovascular;  Laterality: N/A;   ROTATOR CUFF REPAIR     for chronic rotator cuff injury   Mr.,ed Allergies  Allergen Reactions   Penicillins     Did it involve swelling of the face/tongue/throat, SOB, or low BP? Unknown Did it involve sudden or severe rash/hives, skin peeling, or any reaction on the inside of your mouth or nose? Unknown Did you need to seek medical attention at a hospital or doctor's office? No When did it last happen?      Decades Ago If all above answers are "NO", may proceed with cephalosporin use.         Assessment & Plan:   Problem List Items Addressed This Visit       Respiratory   Acute bacterial rhinosinusitis - Primary    Patients symptoms are consistent with bacterial rhinosinusitis given duration of illness as well as presence of sinus pressure and pain and mucopurulent nasal congestion. Start Doxycycline 168m BID for 10 days and continue OTC supportive treatment. Encouraged rest and hydration, flonase, and Coricidin. Return to office if symptoms persist or worsen.      Relevant Medications   doxycycline (VIBRA-TABS) 100 MG tablet    Meds ordered this encounter  Medications   doxycycline (VIBRA-TABS) 100 MG tablet    Sig: Take 1 tablet (100 mg total) by mouth 2 (two) times daily.    Dispense:  20 tablet    Refill:  0    Order Specific Question:   Supervising Provider    Answer:   PJenna LuoT [[[id number]   Return if symptoms worsen or fail to improve.  ARubie Maid FNP

## 2022-02-19 ENCOUNTER — Ambulatory Visit: Payer: Medicare Other | Admitting: Physical Therapy

## 2022-02-23 ENCOUNTER — Ambulatory Visit: Payer: Medicare Other | Admitting: Physical Therapy

## 2022-02-26 ENCOUNTER — Ambulatory Visit: Payer: Medicare Other | Admitting: Physical Therapy

## 2022-03-01 ENCOUNTER — Telehealth: Payer: Self-pay | Admitting: Family Medicine

## 2022-03-01 ENCOUNTER — Other Ambulatory Visit: Payer: Self-pay | Admitting: Family Medicine

## 2022-03-01 MED ORDER — LEVOFLOXACIN 500 MG PO TABS: 500.0000 mg | ORAL_TABLET | Freq: Every day | ORAL | 0 refills | Status: AC

## 2022-03-01 NOTE — Telephone Encounter (Signed)
Patient's daughter Maudie Mercury called; head cold and congestion not any better (ongoing for past month or so); ears still clogged and he's unable to hear. Patient is miserable and doesn't know what to do to ease sx. Soonest available appt with ENT doctor isn't until January.   Please advise daughter Maudie Mercury at [phone]

## 2022-03-02 DIAGNOSIS — M1712 Unilateral primary osteoarthritis, left knee: Secondary | ICD-10-CM | POA: Diagnosis not present

## 2022-03-02 DIAGNOSIS — M170 Bilateral primary osteoarthritis of knee: Secondary | ICD-10-CM | POA: Diagnosis not present

## 2022-03-02 DIAGNOSIS — M1711 Unilateral primary osteoarthritis, right knee: Secondary | ICD-10-CM | POA: Diagnosis not present

## 2022-03-08 ENCOUNTER — Ambulatory Visit: Payer: Medicare Other | Admitting: Physical Therapy

## 2022-03-12 ENCOUNTER — Ambulatory Visit (INDEPENDENT_AMBULATORY_CARE_PROVIDER_SITE_OTHER): Payer: Medicare Other

## 2022-03-12 DIAGNOSIS — Z0000 Encounter for general adult medical examination without abnormal findings: Secondary | ICD-10-CM

## 2022-03-12 DIAGNOSIS — H6123 Impacted cerumen, bilateral: Secondary | ICD-10-CM | POA: Diagnosis not present

## 2022-03-12 NOTE — Progress Notes (Signed)
Subjective:   Victorhugo Preis Haskin is a 88 y.o. male who presents for Medicare Annual/Subsequent preventive examination.  Review of Systems     Cardiac Risk Factors include: advanced age (>24mn, >>28women);hypertension;male gender;diabetes mellitus     Objective:    There were no vitals filed for this visit. There is no height or weight on file to calculate BMI.     03/12/2022   12:12 PM 01/13/2022   12:36 PM 01/05/2022    2:58 PM 09/27/2021    6:22 PM 03/06/2021   12:46 PM 08/29/2020    8:17 AM 07/07/2020   10:38 PM  Advanced Directives  Does Patient Have a Medical Advance Directive? Yes Yes Yes No Yes Yes No  Type of AParamedicof [place] will [place]Living will [place] will  [place]Living will [place]ake CarolineLiving will   Does patient want to make changes to medical advance directive?      No - Patient declined   Copy of [place] Chart?     No - copy requested No - copy requested   Would patient like information on creating a medical advance directive?    No - Patient declined       Current Medications (verified) Outpatient Encounter Medications as of 03/12/2022  Medication Sig   acetaminophen (TYLENOL) 325 MG tablet Take 1-2 tablets (325-650 mg total) by mouth every 4 (four) hours as needed for mild pain.   amLODipine (NORVASC) 10 MG tablet Take 1 tablet (10 mg total) by mouth daily.   atorvastatin (LIPITOR) 80 MG tablet TAKE 1 TABLET BY MOUTH EVERY DAY AT 6 PM   carbamazepine (CARBATROL) 300 MG 12 hr capsule TAKE 1 CAPSULE(300 MG) BY MOUTH TWICE DAILY (Patient taking differently: Take 300 mg by mouth 2 (two) times daily.)   cloNIDine (CATAPRES) 0.2 MG tablet TAKE 1 TABLET(0.2 MG) BY MOUTH TWICE DAILY (Patient taking differently: Take 0.2 mg by mouth daily.)   donepezil (ARICEPT) 5 MG tablet TAKE 1 TABLET(5 MG) BY MOUTH AT BEDTIME    doxycycline (VIBRA-TABS) 100 MG tablet Take 1 tablet (100 mg total) by mouth 2 (two) times daily.   escitalopram (LEXAPRO) 5 MG tablet TAKE 1 TABLET BY MOUTH EVERY DAY AT BEDTIME   furosemide (LASIX) 20 MG tablet TAKE 1/2 TABLET(10 MG) BY MOUTH DAILY   glucose blood test strip 1 each by Other route as needed. Use as instructed   glucose monitoring kit (FREESTYLE) monitoring kit 1 each by Does not apply route as needed.   isosorbide mononitrate (IMDUR) 60 MG 24 hr tablet TAKE 1/2 TABLET BY MOUTH EVERY DAY   Lancets (ACCU-CHEK SAFE-T PRO) lancets 1 each by Other route as needed. Use as instructed   levETIRAcetam (KEPPRA) 500 MG tablet Take 1 tablet (500 mg total) by mouth 2 (two) times daily.   levothyroxine (SYNTHROID) 88 MCG tablet Take 1 tablet (88 mcg total) by mouth daily.   losartan (COZAAR) 100 MG tablet TAKE 1/2 TABLET BY MOUTH DAILY (Patient taking differently: Take 100 mg by mouth daily.)   Multiple Vitamins-Minerals (MULTIVITAMINS THER. W/MINERALS) TABS Take 1 tablet by mouth at bedtime.     MYRBETRIQ 50 MG TB24 tablet Take 50 mg by mouth daily.   rivaroxaban (XARELTO) 20 MG TABS tablet Take 1 tablet (20 mg total) by mouth daily with supper.   tamsulosin (FLOMAX) 0.4 MG CAPS capsule TAKE 1 CAPSULE(0.4 MG) BY MOUTH DAILY AFTER  AND SUPPER (Patient taking differently: Take 0.4 mg by mouth daily after supper.)   No facility-administered encounter medications on file as of 03/12/2022.    Allergies (verified) Penicillins   History: Past Medical History:  Diagnosis Date   Atrial flutter ([facility])    BPH (benign prostatic hyperplasia)    Coronary artery disease    a. s/p MI tx with Cypher DES to pRCA in 4/04;  b. Echocardiogram 7/09: Normal LV function.  c. Nuclear study 3/13 no ischemia;  d. ETT 2/14 neg;  e. admx with CP => LHC (01/30/2013):  pLAD 40-50%, oD1 70-80%, oOM1 40%, pRCA stent patent, mid RCA 30%, EF 60-65%. => med Rx.   DDD (degenerative disc disease)    Diabetes mellitus  without complication ([place])    Dyslipidemia    Gait disorder 07/13/2018   Hemorrhoids    Hx MRSA infection    left buttocks abscess   Hx of echocardiogram    a. Echocardiogram (01/31/2013): Mild focal basal and mild concentric hypertrophy of the septum, EF 50-55%, normal wall motion, grade 1 diastolic dysfunction, trivial AI, MAC, mild LAE, PASP 35   Hyperlipidemia    Hypertension    Hypothyroidism    Meniere's disease    Status post shunt   Myocardial infarction [facility]) 2008   Occlusion and stenosis of carotid artery without mention of cerebral infarction    40-59% on carotid doppler 2014; [place] (01/2013): R 1-39%, L 60-79%   Rotator cuff injury    chronic rotator cuff injury status post repair   Seizure disorder ([place])    Stroke ([place])    a. 01/2013=> post cardiac cath CVA to L post communicating artery system; R sided weakness   Syncope    Past Surgical History:  Procedure Laterality Date   COLONOSCOPY     CORONARY ANGIOPLASTY WITH STENT PLACEMENT  07/14/2002   Stent to the right coronary artery   ENDOLYMPHATIC SHUNT DECOMPRESSION  06/11/2009    Right endolymphatic sac decompression and shunt  placement   HERNIA REPAIR  04/10/2008   scrotal hernia repair   LEFT HEART CATHETERIZATION WITH CORONARY ANGIOGRAM N/A 01/30/2013   Procedure: LEFT HEART CATHETERIZATION WITH CORONARY ANGIOGRAM;  Surgeon: Larey Dresser, MD;  Location[facility] CATH LAB;  Service: Cardiovascular;  Laterality: N/A;   PACEMAKER IMPLANT N/A 08/29/2020   Procedure: PACEMAKER IMPLANT;  Surgeon: [name], MD;  Location: [place] CV LAB;  Service: Cardiovascular;  Laterality: N/A;   ROTATOR CUFF REPAIR     for chronic rotator cuff injury   Family History  Problem Relation Age of Onset   Heart attack Father 60   Aneurysm Mother        brain aneurysm   Coronary artery disease Brother        with CABG   Diabetes Brother    Colon cancer Neg Hx    Colon polyps Neg Hx    Social History   Socioeconomic  History   Marital status: Married    Spouse name: Not on file   Number of children: 4   Years of education: 12th   Highest education level: Not on file  Occupational History   Occupation: Retired  Tobacco Use   Smoking status: Former    Years: 5.00    Types: Cigarettes    Quit date: 08/19/1956    Years since quitting: 65.6   Smokeless tobacco: Former    Types: Nurse, children's Use: Never used  Substance and Sexual  Activity   Alcohol use: No   Drug use: No   Sexual activity: Never  Other Topics Concern   Not on file  Social History Narrative   Lives with wife.   Children live nearby and help with care.    Social Determinants of Health   Financial Resource Strain: High Risk (03/12/2022)   Overall Financial Resource Strain (CARDIA)    Difficulty of Paying Living Expenses: Hard  Food Insecurity: No Food Insecurity (03/12/2022)   Hunger Vital Sign    Worried About Running Out of Food in the Last Year: Never true    Ran Out of Food in the Last Year: Never true  Transportation Needs: No Transportation Needs (03/12/2022)   PRAPARE - Hydrologist (Medical): No    Lack of Transportation (Non-Medical): No  Physical Activity: Inactive (03/12/2022)   Exercise Vital Sign    Days of Exercise per Week: 2 days    Minutes of Exercise per Session: 0 min  Stress: No Stress Concern Present (03/12/2022)   [place]    Feeling of Stress : Not at all  Social Connections: Moderately Integrated (03/12/2022)   Social Connection and Isolation Panel [NHANES]    Frequency of Communication with Friends and Family: More than three times a week    Frequency of Social Gatherings with Friends and Family: More than three times a week    Attends Religious Services: Never    Marine scientist or Organizations: Yes    Attends Music therapist: 1 to 4 times per year    Marital  Status: Married    Tobacco Counseling Counseling given: Not Answered   Clinical Intake:  Pre-visit preparation completed: Yes  Pain : No/denies pain     BMI - recorded: 27.53 Nutritional Status: BMI 25 -29 Overweight Diabetes: No  How often do you need to have someone help you when you read instructions, pamphlets, or other written materials from your doctor or pharmacy?: 1 - Never What is the last grade level you completed in school?: 9-  Diabetic?n/a         Activities of Daily Living    03/12/2022   12:17 PM 09/27/2021    6:22 PM  In your present state of health, do you have any difficulty performing the following activities:  Hearing? 0 0  Vision? 0 0  Difficulty concentrating or making decisions? 0 0  Walking or climbing stairs? 0 1  Dressing or bathing? 0 0  Doing errands, shopping? 0 0  Preparing Food and eating ? N   Using the Toilet? N   In the past six months, have you accidently leaked urine? Y   Do you have problems with loss of bowel control? N   Managing your Medications? N   Managing your Finances? N   Housekeeping or managing your Housekeeping? N     Patient Care Team: [name], MD as PCP - General (Family Medicine) Minus Breeding, MD as PCP - Cardiology (Cardiology) Edythe Clarity, [facility] as Pharmacist (Pharmacist)  Indicate any recent Medical Services you may have received from other than [facility] providers in the past year (date may be approximate).     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  Hearing/Vision screen No results found.  Dietary issues and exercise activities discussed:     Goals Addressed  This Visit's Progress    Have 3 meals a day   On track    Pt would like to continue to eat health, sleep well and exercise as tolerated.      Track and Manage My Blood Pressure-Hypertension   On track    Timeframe:  Long-Range Goal Priority:  High Start Date:  10/28/21                            Expected End Date:  04/30/21                     Follow Up Date 01/28/22    - check blood pressure 3 times per week - choose a place to take my blood pressure (home, clinic or office, retail store) - write blood pressure results in a log or diary    Why is this important?   You won't feel high blood pressure, but it can still hurt your blood vessels.  High blood pressure can cause heart or kidney problems. It can also cause a stroke.  Making lifestyle changes like losing a little weight or eating less salt will help.  Checking your blood pressure at home and at different times of the day can help to control blood pressure.  If the doctor prescribes medicine remember to take it the way the doctor ordered.  Call the office if you cannot afford the medicine or if there are questions about it.     Notes:        Depression Screen    03/12/2022   12:15 PM 01/01/2022   11:12 AM 03/06/2021   12:43 PM 07/19/2018    1:18 PM 02/13/2018   10:16 AM 05/06/2017    8:50 AM 04/14/2017    2:25 PM  PHQ 2/9 Scores  PHQ - 2 Score 0 0 0 0 0 0 0    Fall Risk    03/06/2021   12:47 PM 07/31/2020   11:26 AM 10/17/2018   12:06 PM 07/19/2018    1:18 PM 02/13/2018   10:16 AM  [address] in the past year? 1 0 0 0 1  Number falls in past yr: 1 0 0  0  Injury with Fall? 1 0 0  0  Risk for fall due to : Impaired balance/gait;Impaired mobility;History of fall(s)    History of fall(s);Impaired mobility  Follow up Falls prevention discussed    Falls evaluation completed    FALL RISK PREVENTION PERTAINING TO THE HOME:  Any stairs in or around the home? Yes  If so, are there any without handrails? Yes  Home free of loose throw rugs in walkways, pet beds, electrical cords, etc? Yes  Adequate lighting in your home to reduce risk of falls? Yes   ASSISTIVE DEVICES UTILIZED TO PREVENT FALLS:  Life alert? Yes  Use of a cane, walker or w/c? Yes  Grab bars in the bathroom? Yes  Shower  chair or bench in shower? Yes  Elevated toilet seat or a handicapped toilet? Yes   TIMED UP AND GO:  Was the test performed? No .  Length of time to ambulate 10 feet: n/a sec.     Cognitive Function:    10/17/2018    1:04 PM  MMSE - Mini Mental State Exam  Orientation to time 5  Orientation to Place 5  Registration 3  Attention/ Calculation 5  Recall 2  Language- name 2  objects 2  Language- repeat 0  Language- follow 3 step command 3  Language- read & follow direction 1  Write a sentence 1  Copy design 0  Total score 27        03/12/2022   12:20 PM 03/06/2021   12:50 PM  6CIT Screen  What Year? 0 points 0 points  What month? 0 points 0 points  What time? 0 points 0 points  Count back from 20 0 points 0 points  Months in reverse 0 points 0 points  Repeat phrase 6 points 2 points  Total Score 6 points 2 points    Immunizations Immunization History  Administered Date(s) Administered   Fluad Quad(high Dose 65+) 12/12/2018, 01/17/2020   Influenza, High Dose Seasonal PF 12/09/2016, 01/31/2018   Influenza,inj,Quad PF,6+ Mos 12/04/2012, 01/01/2014, 01/15/2015, 12/22/2015   PFIZER(Purple Top)SARS-COV-2 Vaccination 04/29/2019, 05/23/2019   Pneumococcal Conjugate-13 03/01/2013   Pneumococcal Polysaccharide-23 02/03/1999, 02/27/2010   Td 08/16/1996   Tdap 07/31/2014    TDAP status: Up to date  Flu: no, advice to get it during next OV  Pneumococcal vaccine status: Up to date  Covid-19 vaccine status: Information provided on how to obtain vaccines.   Qualifies for Shingles Vaccine? Yes   Zostavax completed  per think he had the vaccines   Shingrix Completed?: No.    Education has been provided regarding the importance of this vaccine. Patient has been advised to call insurance company to determine out of pocket expense if they have not yet received this vaccine. Advised may also receive vaccine at local pharmacy or Health Dept. Verbalized acceptance and  understanding.  Screening Tests Health Maintenance  Topic Date Due   Zoster Vaccines- Shingrix (1 of 2) Never done   OPHTHALMOLOGY EXAM  09/27/2018   FOOT EXAM  03/19/2020   INFLUENZA VACCINE  10/20/2021   HEMOGLOBIN A1C  11/05/2021   COVID-19 Vaccine (3 - 2023-24 season) 11/20/2021   Medicare Annual Wellness (AWV)  03/13/2023   DTaP/Tdap/Td (3 - Td or Tdap) 07/30/2024   Pneumonia Vaccine 59+ Years old  Completed   HPV VACCINES  Aged Out    Health Maintenance  Health Maintenance Due  Topic Date Due   Zoster Vaccines- Shingrix (1 of 2) Never done   OPHTHALMOLOGY EXAM  09/27/2018   FOOT EXAM  03/19/2020   INFLUENZA VACCINE  10/20/2021   HEMOGLOBIN A1C  11/05/2021   COVID-19 Vaccine (3 - 2023-24 season) 11/20/2021    Colorectal cancer screening: No longer required.   Lung Cancer Screening: (Low Dose CT Chest recommended if Age 80-80 years, 30 pack-year currently smoking OR have quit w/in 15years.) does not qualify.   Lung Cancer Screening Referral: n/a  Additional Screening:  Hepatitis C Screening: does not qualify; Completed n/a  Vision Screening: Recommended annual ophthalmology exams for early detection of glaucoma and other disorders of the eye. Not currently/per pt will call to make appt Is the patient up to date with their annual eye exam?  No  Who is the provider or what is the name of the office in which the patient attends annual eye exams? No referral/ staying w/same eye dr, can't remember name at the moment If pt is not established with a provider, would they like to be referred to a provider to establish care? No .   Dental Screening: Recommended annual dental exams for proper oral hygiene  Community Resource Referral / Chronic Care Management: CRR required this visit?  No   CCM required this visit?  No  Plan:     I have personally reviewed and noted the following in the patient's chart:   Medical and social history Use of alcohol, tobacco or  illicit drugs  Current medications and supplements including opioid prescriptions. Patient is not currently taking opioid prescriptions. Functional ability and status Nutritional status Physical activity Advanced directives List of other physicians Hospitalizations, surgeries, and ER visits in previous 12 months Vitals Screenings to include cognitive, depression, and falls Referrals and appointments  In addition, I have reviewed and discussed with patient certain preventive protocols, quality metrics, and best practice recommendations. A written personalized care plan for preventive services as well as general preventive health recommendations were provided to patient.     Colman Cater, [place]   03/12/2022   Nurse Notes: n/a

## 2022-03-13 ENCOUNTER — Other Ambulatory Visit: Payer: Self-pay | Admitting: Family Medicine

## 2022-03-27 ENCOUNTER — Other Ambulatory Visit: Payer: Self-pay | Admitting: Family Medicine

## 2022-03-29 ENCOUNTER — Ambulatory Visit (INDEPENDENT_AMBULATORY_CARE_PROVIDER_SITE_OTHER): Payer: Medicare Other | Admitting: Family Medicine

## 2022-03-29 ENCOUNTER — Encounter: Payer: Self-pay | Admitting: Family Medicine

## 2022-03-29 VITALS — BP 140/78 | HR 87 | Ht 68.0 in | Wt 177.0 lb

## 2022-03-29 DIAGNOSIS — J019 Acute sinusitis, unspecified: Secondary | ICD-10-CM

## 2022-03-29 DIAGNOSIS — E039 Hypothyroidism, unspecified: Secondary | ICD-10-CM

## 2022-03-29 DIAGNOSIS — E118 Type 2 diabetes mellitus with unspecified complications: Secondary | ICD-10-CM | POA: Diagnosis not present

## 2022-03-29 MED ORDER — AZITHROMYCIN 250 MG PO TABS: ORAL_TABLET | ORAL | 0 refills | Status: DC

## 2022-03-29 NOTE — Progress Notes (Signed)
Subjective:    Patient ID: [name], male    DOB: 09/29/1933, 89 y.o.   MRN: [id number]  Symptoms began more than 2 weeks ago.  He continues to have pain and pressure in both maxillary and frontal sinuses.  He reports postnasal drip causing a cough.  He reports a dull headache.  He denies any fevers or chills. Past Medical History:  Diagnosis Date   Atrial flutter ([place])    BPH (benign prostatic hyperplasia)    Coronary artery disease    a. s/p MI tx with Cypher DES to pRCA in 4/04;  b. Echocardiogram 7/09: Normal LV function.  c. Nuclear study 3/13 no ischemia;  d. ETT 2/14 neg;  e. admx with CP => LHC (01/30/2013):  pLAD 40-50%, oD1 70-80%, oOM1 40%, pRCA stent patent, mid RCA 30%, EF 60-65%. => med Rx.   DDD (degenerative disc disease)    Diabetes mellitus without complication ([place])    Dyslipidemia    Gait disorder 07/13/2018   Hemorrhoids    Hx MRSA infection    left buttocks abscess   Hx of echocardiogram    a. Echocardiogram (01/31/2013): Mild focal basal and mild concentric hypertrophy of the septum, EF 50-55%, normal wall motion, grade 1 diastolic dysfunction, trivial AI, MAC, mild LAE, PASP 35   Hyperlipidemia    Hypertension    Hypothyroidism    Meniere's disease    Status post shunt   Myocardial infarction [facility]) 2008   Occlusion and stenosis of carotid artery without mention of cerebral infarction    40-59% on carotid doppler 2014; [place] (01/2013): R 1-39%, L 60-79%   Rotator cuff injury    chronic rotator cuff injury status post repair   Seizure disorder ([place])    Stroke ([place])    a. 01/2013=> post cardiac cath CVA to L post communicating artery system; R sided weakness   Syncope    Past Surgical History:  Procedure Laterality Date   COLONOSCOPY     CORONARY ANGIOPLASTY WITH STENT PLACEMENT  07/14/2002   Stent to the right coronary artery   ENDOLYMPHATIC SHUNT DECOMPRESSION  06/11/2009    Right endolymphatic sac decompression and shunt  placement   HERNIA  REPAIR  04/10/2008   scrotal hernia repair   LEFT HEART CATHETERIZATION WITH CORONARY ANGIOGRAM N/A 01/30/2013   Procedure: LEFT HEART CATHETERIZATION WITH CORONARY ANGIOGRAM;  Surgeon: Larey Dresser, MD;  Location[facility] C[facility] LAB;  Service: Cardiovascular;  Laterality: N/A;   PACEMAKER IMPLANT N/A 08/29/2020   Procedure: PACEMAKER IMPLANT;  Surgeon: [name], MD;  Location: [place] CV LAB;  Service: Cardiovascular;  Laterality: N/A;   ROTATOR CUFF REPAIR     for chronic rotator cuff injury   Current Outpatient Medications on File Prior to Visit  Medication Sig Dispense Refill   acetaminophen (TYLENOL) 325 MG tablet Take 1-2 tablets (325-650 mg total) by mouth every 4 (four) hours as needed for mild pain.     amLODipine (NORVASC) 10 MG tablet Take 1 tablet (10 mg total) by mouth daily. 90 tablet 3   atorvastatin (LIPITOR) 80 MG tablet TAKE 1 TABLET BY MOUTH EVERY DAY AT 6 PM 90 tablet 3   carbamazepine (CARBATROL) 300 MG 12 hr capsule TAKE 1 CAPSULE(300 MG) BY MOUTH TWICE DAILY (Patient taking differently: Take 300 mg by mouth 2 (two) times daily.) 180 capsule 3   cloNIDine (CATAPRES) 0.2 MG tablet TAKE 1 TABLET(0.2 MG) BY MOUTH TWICE DAILY (Patient taking differently: Take 0.2 mg by  mouth daily.) 180 tablet 3   donepezil (ARICEPT) 5 MG tablet TAKE 1 TABLET(5 MG) BY MOUTH AT BEDTIME 90 tablet 0   doxycycline (VIBRA-TABS) 100 MG tablet Take 1 tablet (100 mg total) by mouth 2 (two) times daily. 20 tablet 0   escitalopram (LEXAPRO) 5 MG tablet TAKE 1 TABLET BY MOUTH EVERY DAY AT BEDTIME 90 tablet 0   furosemide (LASIX) 20 MG tablet TAKE 1/2 TABLET(10 MG) BY MOUTH DAILY 45 tablet 0   glucose blood test strip 1 each by Other route as needed. Use as instructed 100 each 3   glucose monitoring kit (FREESTYLE) monitoring kit 1 each by Does not apply route as needed. 1 each 0   isosorbide mononitrate (IMDUR) 60 MG 24 hr tablet TAKE 1/2 TABLET BY MOUTH EVERY DAY 90 tablet 3   Lancets (ACCU-CHEK  SAFE-T PRO) lancets 1 each by Other route as needed. Use as instructed 100 each 3   levETIRAcetam (KEPPRA) 500 MG tablet Take 1 tablet (500 mg total) by mouth 2 (two) times daily. 60 tablet 6   levothyroxine (SYNTHROID) 88 MCG tablet Take 1 tablet (88 mcg total) by mouth daily. 90 tablet 3   losartan (COZAAR) 100 MG tablet TAKE 1/2 TABLET BY MOUTH DAILY 90 tablet 1   Multiple Vitamins-Minerals (MULTIVITAMINS THER. W/MINERALS) TABS Take 1 tablet by mouth at bedtime.       MYRBETRIQ 50 MG TB24 tablet Take 50 mg by mouth daily.     rivaroxaban (XARELTO) 20 MG TABS tablet Take 1 tablet (20 mg total) by mouth daily with supper. 90 tablet 3   tamsulosin (FLOMAX) 0.4 MG CAPS capsule TAKE 1 CAPSULE(0.4 MG) BY MOUTH DAILY AFTER AND SUPPER (Patient taking differently: Take 0.4 mg by mouth daily after supper.) 90 capsule 3   No current facility-administered medications on file prior to visit.     Allergies  Allergen Reactions   Penicillins     Did it involve swelling of the face/tongue/throat, SOB, or low BP? Unknown Did it involve sudden or severe rash/hives, skin peeling, or any reaction on the inside of your mouth or nose? Unknown Did you need to seek medical attention at a hospital or doctor's office? No When did it last happen?      Decades Ago If all above answers are "NO", may proceed with cephalosporin use.     Social History   Socioeconomic History   Marital status: Married    Spouse name: Not on file   Number of children: 4   Years of education: 12th   Highest education level: Not on file  Occupational History   Occupation: Retired  Tobacco Use   Smoking status: Former    Years: 5.00    Types: Cigarettes    Quit date: 08/19/1956    Years since quitting: 65.6   Smokeless tobacco: Former    Types: Nurse, children's Use: Never used  Substance and Sexual Activity   Alcohol use: No   Drug use: No   Sexual activity: Never  Other Topics Concern   Not on file  Social  History Narrative   Lives with wife.   Children live nearby and help with care.    Social Determinants of Health   Financial Resource Strain: High Risk (03/12/2022)   Overall Financial Resource Strain (CARDIA)    Difficulty of Paying Living Expenses: Hard  Food Insecurity: No Food Insecurity (03/12/2022)   Hunger Vital Sign    Worried About Running Out  of Food in the Last Year: Never true    Reader in the Last Year: Never true  Transportation Needs: No Transportation Needs (03/12/2022)   PRAPARE - Hydrologist (Medical): No    Lack of Transportation (Non-Medical): No  Physical Activity: Inactive (03/12/2022)   Exercise Vital Sign    Days of Exercise per Week: 2 days    Minutes of Exercise per Session: 0 min  Stress: No Stress Concern Present (03/12/2022)   [place]    Feeling of Stress : Not at all  Social Connections: Moderately Integrated (03/12/2022)   Social Connection and Isolation Panel [NHANES]    Frequency of Communication with Friends and Family: More than three times a week    Frequency of Social Gatherings with Friends and Family: More than three times a week    Attends Religious Services: Never    Marine scientist or Organizations: Yes    Attends Archivist Meetings: 1 to 4 times per year    Marital Status: Married  Human resources officer Violence: Not At Risk (03/12/2022)   Humiliation, Afraid, Rape, and Kick questionnaire    Fear of Current or Ex-Partner: No    Emotionally Abused: No    Physically Abused: No    Sexually Abused: No     Review of Systems  All other systems reviewed and are negative.      Objective:   Physical Exam Vitals reviewed.  Constitutional:      General: He is not in acute distress.    Appearance: Normal appearance. He is obese. He is not ill-appearing, toxic-appearing or diaphoretic.  HENT:     Right Ear: Tympanic  membrane and ear canal normal.     Left Ear: Tympanic membrane and ear canal normal.     Nose: Congestion and rhinorrhea present.     Right Sinus: Frontal sinus tenderness present.     Left Sinus: Frontal sinus tenderness present.     Mouth/Throat:     Pharynx: No oropharyngeal exudate or posterior oropharyngeal erythema.  Neck:     Vascular: No carotid bruit.  Cardiovascular:     Rate and Rhythm: Normal rate and regular rhythm.     Heart sounds: Normal heart sounds.     No friction rub. No gallop.  Pulmonary:     Effort: Pulmonary effort is normal. No respiratory distress.     Breath sounds: Normal breath sounds. No stridor. No wheezing, rhonchi or rales.  Musculoskeletal:     Right lower leg: No edema.     Left lower leg: No edema.  Lymphadenopathy:     Cervical: No cervical adenopathy.  Neurological:     Mental Status: He is alert.          Assessment & Plan:  Hypothyroidism, unspecified type - Plan: Hemoglobin A1c, CBC with Differential/Platelet, COMPLETE METABOLIC PANEL WITH GFR, TSH  Controlled type 2 diabetes mellitus with complication, without long-term current use of insulin (HCC) - Plan: Hemoglobin A1c, CBC with Differential/Platelet, COMPLETE METABOLIC PANEL WITH GFR, TSH  Acute rhinosinusitis Symptoms have been present for more than 2 weeks.  I suspect a bacterial sinus infection.  Begin a Z-Pak due to his allergy profile.  While the patient I will check an A1c to monitor the management of his diabetes which is currently diet controlled.  I will also check a CMP and a TSH due to his history of hypothyroidism.

## 2022-03-29 NOTE — Telephone Encounter (Signed)
Requested medications are due for refill today.  unsure  Requested medications are on the active medications list.  yes  Last refill. 07/06/2021 #90 1 rf  Future visit scheduled.   yes  Notes to clinic.  Pt is taking 1 tablet daily. Sig states that pt should be taking 1/2 tablet. Please advise.    Requested Prescriptions  Pending Prescriptions Disp Refills   losartan (COZAAR) 100 MG tablet [Pharmacy Med Name: LOSARTAN '100MG'$  TABLETS] 90 tablet 1    Sig: TAKE 1/2 TABLET BY MOUTH DAILY     Cardiovascular:  Angiotensin Receptor Blockers Failed - 03/27/2022 10:51 AM      Failed - Last BP in normal range    BP Readings from Last 1 Encounters:  02/16/22 (!) 140/60         Failed - Valid encounter within last 6 months    Recent Outpatient Visits           10 months ago Essential hypertension   [place], Cammie Mcgee, MD   1 year ago Hematuria, unspecified type   [place] [name], MD   1 year ago Paroxysmal atrial fibrillation [facility])   [place] Dennard Schaumann, Cammie Mcgee, MD   1 year ago Paroxysmal atrial fibrillation [facility])   [place] Dennard Schaumann, Cammie Mcgee, MD   1 year ago Herpes zoster without complication   [place] Eulogio Bear, NP       Future Appointments             Today [name], MD [place]            Passed - Cr in normal range and within 180 days    Creat  Date Value Ref Range Status  05/08/2021 1.19 0.70 - 1.22 mg/dL Final   Creatinine, Ser  Date Value Ref Range Status  01/28/2022 1.01 0.76 - 1.27 mg/dL Final         Passed - K in normal range and within 180 days    Potassium  Date Value Ref Range Status  01/28/2022 4.4 3.5 - 5.2 mmol/L Final         Passed - Patient is not pregnant

## 2022-03-30 LAB — COMPLETE METABOLIC PANEL WITH GFR
AG Ratio: 1.6 (calc) (ref 1.0–2.5)
ALT: 10 U/L (ref 9–46)
AST: 15 U/L (ref 10–35)
Albumin: 3.9 g/dL (ref 3.6–5.1)
Alkaline phosphatase (APISO): 68 U/L (ref 35–144)
BUN: 17 mg/dL (ref 7–25)
CO2: 26 mmol/L (ref 20–32)
Calcium: 8.8 mg/dL (ref 8.6–10.3)
Chloride: 102 mmol/L (ref 98–110)
Creat: 1.14 mg/dL (ref 0.70–1.22)
Globulin: 2.4 g/dL (calc) (ref 1.9–3.7)
Glucose, Bld: 186 mg/dL — ABNORMAL HIGH (ref 65–99)
Potassium: 3.9 mmol/L (ref 3.5–5.3)
Sodium: 138 mmol/L (ref 135–146)
Total Bilirubin: 0.4 mg/dL (ref 0.2–1.2)
Total Protein: 6.3 g/dL (ref 6.1–8.1)
eGFR: 63 mL/min/{1.73_m2} (ref >=60)

## 2022-03-30 LAB — HEMOGLOBIN A1C
Hgb A1c MFr Bld: 6.3 % of total Hgb — ABNORMAL HIGH (ref <5.7)
Mean Plasma Glucose: 134 mg/dL
eAG (mmol/L): 7.4 mmol/L

## 2022-03-30 LAB — CBC WITH DIFFERENTIAL/PLATELET
Absolute Monocytes: 1246 cells/uL — ABNORMAL HIGH (ref 200–950)
Basophils Absolute: 66 cells/uL (ref 0–200)
Basophils Relative: 0.8 %
Eosinophils Absolute: 886 cells/uL — ABNORMAL HIGH (ref 15–500)
Eosinophils Relative: 10.8 %
HCT: 32.7 % — ABNORMAL LOW (ref 38.5–50.0)
Hemoglobin: 11 g/dL — ABNORMAL LOW (ref 13.2–17.1)
Lymphs Abs: 1943 cells/uL (ref 850–3900)
MCH: 31.6 pg (ref 27.0–33.0)
MCHC: 33.6 g/dL (ref 32.0–36.0)
MCV: 94 fL (ref 80.0–100.0)
MPV: 10.3 fL (ref 7.5–12.5)
Monocytes Relative: 15.2 %
Neutro Abs: 4059 cells/uL (ref 1500–7800)
Neutrophils Relative %: 49.5 %
Platelets: 280 10*3/uL (ref 140–400)
RBC: 3.48 10*6/uL — ABNORMAL LOW (ref 4.20–5.80)
RDW: 13.6 % (ref 11.0–15.0)
Total Lymphocyte: 23.7 %
WBC: 8.2 10*3/uL (ref 3.8–10.8)

## 2022-03-30 LAB — TSH: TSH: 5.79 mIU/L — ABNORMAL HIGH (ref 0.40–4.50)

## 2022-03-31 ENCOUNTER — Ambulatory Visit (INDEPENDENT_AMBULATORY_CARE_PROVIDER_SITE_OTHER): Payer: Medicare Other

## 2022-03-31 ENCOUNTER — Telehealth: Payer: Self-pay

## 2022-03-31 DIAGNOSIS — I441 Atrioventricular block, second degree: Secondary | ICD-10-CM

## 2022-03-31 LAB — CUP PACEART REMOTE DEVICE CHECK
Battery Remaining Longevity: 134 mo
Battery Voltage: 3.03 V
Brady Statistic RA Percent Paced: 0.04 %
Brady Statistic RV Percent Paced: 79.41 %
Date Time Interrogation Session: 20240110053525
Implantable Lead Connection Status: 753985
Implantable Lead Connection Status: 753985
Implantable Lead Implant Date: 20220610
Implantable Lead Implant Date: 20220610
Implantable Lead Location: 753859
Implantable Lead Location: 753860
Implantable Lead Model: 3830
Implantable Lead Model: 5076
Implantable Pulse Generator Implant Date: 20220610
Lead Channel Impedance Value: 247 Ohm
Lead Channel Impedance Value: 285 Ohm
Lead Channel Impedance Value: 304 Ohm
Lead Channel Impedance Value: 475 Ohm
Lead Channel Pacing Threshold Amplitude: 0.75 V
Lead Channel Pacing Threshold Amplitude: 0.75 V
Lead Channel Pacing Threshold Pulse Width: 0.4 ms
Lead Channel Pacing Threshold Pulse Width: 0.4 ms
Lead Channel Sensing Intrinsic Amplitude: 1.5 mV
Lead Channel Sensing Intrinsic Amplitude: 1.5 mV
Lead Channel Sensing Intrinsic Amplitude: 8.375 mV
Lead Channel Sensing Intrinsic Amplitude: 8.375 mV
Lead Channel Setting Pacing Amplitude: 1.5 V
Lead Channel Setting Pacing Amplitude: 2 V
Lead Channel Setting Pacing Pulse Width: 0.4 ms
Lead Channel Setting Sensing Sensitivity: 1.2 mV
Zone Setting Status: 755011

## 2022-03-31 NOTE — Telephone Encounter (Signed)
Alert received from [employer]:  Scheduled remote reviewed. Normal device function.   Persistent AF since 12/29, controlled rates, Xarelto, burden 100% - Route to triage for persistence  Outreach made to Pt.  He has not been able to tell that he has been in atrial fibrillation.  Scheduled Pt with RU 04/09/22 at 8;25 am.

## 2022-04-08 NOTE — Progress Notes (Signed)
Cardiology Office Note Date:  04/09/2022  Patient ID:  [name], [name] 1933-06-12, MRN [id number] PCP:  [name], MD  Cardiologist:  Minus Breeding, MD Electrophysiologist: [name], MD  Chief Complaint: 1year PM follow-up, past due; persistent Afib by device  History of Present Illness: [name] is a 89 y.o. male with PMH notable for intermittent CHB s/p PPM, parox AFib, HTN, CAD, Chronic diastolic HF; seen today for [name], MD for routine electrophysiology followup. Since last being seen in our clinic the patient reports doing very well.   Last saw Dr. Lovena Le 11/2020, having knee pain, no changes to plan. He last saw Dr. Alba Cory 08/2021, had fallen a couple times when not using walker, knee pain, no cardiac complaints.   Today, his only complaint is a cold he and his wife have had for the past couple weeks.  he denies chest pain, palpitations, dyspnea, PND, orthopnea, nausea, vomiting, dizziness, syncope, edema, weight gain, or early satiety.  He is using a rolling walker today in clinic. Helps take care of his wife who has early stages of alzeiher's  He takes xarelto daily; tries his best to take daily misses doses rarely.  Device Information: MDT dual chamber PM, impl 08/2020; dx symptomatic CHB   Past Medical History:  Diagnosis Date   Atrial flutter (HCC)    BPH (benign prostatic hyperplasia)    Coronary artery disease    a. s/p MI tx with Cypher DES to pRCA in 4/04;  b. Echocardiogram 7/09: Normal LV function.  c. Nuclear study 3/13 no ischemia;  d. ETT 2/14 neg;  e. admx with CP => LHC (01/30/2013):  pLAD 40-50%, oD1 70-80%, oOM1 40%, pRCA stent patent, mid RCA 30%, EF 60-65%. => med Rx.   DDD (degenerative disc disease)    Diabetes mellitus without complication ([place])    Dyslipidemia    Gait disorder 07/13/2018   Hemorrhoids    Hx MRSA infection    left buttocks abscess   Hx of echocardiogram    a. Echocardiogram (01/31/2013): Mild focal  basal and mild concentric hypertrophy of the septum, EF 50-55%, normal wall motion, grade 1 diastolic dysfunction, trivial AI, MAC, mild LAE, PASP 35   Hyperlipidemia    Hypertension    Hypothyroidism    Meniere's disease    Status post shunt   Myocardial infarction [facility]) 2008   Occlusion and stenosis of carotid artery without mention of cerebral infarction    40-59% on carotid doppler 2014; [place] (01/2013): R 1-39%, L 60-79%   Rotator cuff injury    chronic rotator cuff injury status post repair   Seizure disorder ([place])    Stroke ([place])    a. 01/2013=> post cardiac cath CVA to L post communicating artery system; R sided weakness   Syncope     Past Surgical History:  Procedure Laterality Date   COLONOSCOPY     CORONARY ANGIOPLASTY WITH STENT PLACEMENT  07/14/2002   Stent to the right coronary artery   ENDOLYMPHATIC SHUNT DECOMPRESSION  06/11/2009    Right endolymphatic sac decompression and shunt  placement   HERNIA REPAIR  04/10/2008   scrotal hernia repair   LEFT HEART CATHETERIZATION WITH CORONARY ANGIOGRAM N/A 01/30/2013   Procedure: LEFT HEART CATHETERIZATION WITH CORONARY ANGIOGRAM;  Surgeon: Larey Dresser, MD;  Location: [facility] CATH LAB;  Service: Cardiovascular;  Laterality: N/A;   PACEMAKER IMPLANT N/A 08/29/2020   Procedure: PACEMAKER IMPLANT;  Surgeon: [name], MD;  Location: [place]  CV LAB;  Service: Cardiovascular;  Laterality: N/A;   ROTATOR CUFF REPAIR     for chronic rotator cuff injury    Current Outpatient Medications  Medication Instructions   acetaminophen (TYLENOL) 325-650 mg, Oral, Every 4 hours PRN   amLODipine (NORVASC) 10 mg, Oral, Daily   atorvastatin (LIPITOR) 80 MG tablet TAKE 1 TABLET BY MOUTH EVERY DAY AT 6 PM   azithromycin (ZITHROMAX) 250 MG tablet 2 tabs poqday1, 1 tab poqday 2-5   carbamazepine (CARBATROL) 300 MG 12 hr capsule TAKE 1 CAPSULE(300 MG) BY MOUTH TWICE DAILY   cloNIDine (CATAPRES) 0.2 MG tablet TAKE 1 TABLET(0.2 MG) BY MOUTH  TWICE DAILY   donepezil (ARICEPT) 5 MG tablet TAKE 1 TABLET(5 MG) BY MOUTH AT BEDTIME   doxycycline (VIBRA-TABS) 100 mg, Oral, 2 times daily   escitalopram (LEXAPRO) 5 MG tablet TAKE 1 TABLET BY MOUTH EVERY DAY AT BEDTIME   furosemide (LASIX) 20 MG tablet TAKE 1/2 TABLET(10 MG) BY MOUTH DAILY   glucose blood test strip 1 each, Other, As needed, Use as instructed   glucose monitoring kit (FREESTYLE) monitoring kit 1 each, Does not apply, As needed   isosorbide mononitrate (IMDUR) 30 mg, Oral, Daily   Lancets (ACCU-CHEK SAFE-T PRO) lancets 1 each, Other, As needed, Use as instructed   levETIRAcetam (KEPPRA) 500 mg, Oral, 2 times daily   levothyroxine (SYNTHROID) 88 mcg, Oral, Daily   losartan (COZAAR) 100 MG tablet TAKE 1/2 TABLET BY MOUTH DAILY   Multiple Vitamins-Minerals (MULTIVITAMINS THER. W/MINERALS) TABS 1 tablet, Oral, Daily at bedtime,     Myrbetriq 50 mg, Oral, Daily   rivaroxaban (XARELTO) 20 mg, Oral, Daily with supper   tamsulosin (FLOMAX) 0.4 MG CAPS capsule TAKE 1 CAPSULE(0.4 MG) BY MOUTH DAILY AFTER AND SUPPER    Social History:  The patient  reports that he quit smoking about 65 years ago. His smoking use included cigarettes. He has quit using smokeless tobacco.  His smokeless tobacco use included chew. He reports that he does not drink alcohol and does not use drugs.   Family History:  The patient's family history includes Aneurysm in his mother; Coronary artery disease in his brother; Diabetes in his brother; Heart attack (age of onset: 51) in his father.  ROS:  Please see the history of present illness. All other systems are reviewed and otherwise negative.   PHYSICAL EXAM:  VS:  BP 128/72   Pulse 61   Ht 5' 8.5" (1.74 m)   Wt 181 lb (82.1 kg)   SpO2 98%   BMI 27.12 kg/m  BMI: Body mass index is 27.12 kg/m.  GEN- The patient is well appearing, alert and oriented x 3 today.   HEENT: normocephalic, atraumatic; sclera clear, conjunctiva pink; hearing intact;  oropharynx clear; neck supple, no JVP Lungs- Clear to ausculation bilaterally, normal work of breathing.  No wheezes, rales, rhonchi Heart- Regular rate and rhythm, no murmurs, rubs or gallops, PMI not laterally displaced GI- soft, non-tender, non-distended, bowel sounds present, no hepatosplenomegaly Extremities- No peripheral edema. no clubbing or cyanosis; DP/PT/radial pulses 2+ bilaterally MS- no significant deformity or atrophy Skin- warm and dry, no rash or lesion, device pocket well-healed Psych- euthymic mood, full affect Neuro- strength and sensation are intact   Device interrogation done today and reviewed by myself:  Battery good Lead thresholds, impedence, sensing stable  Unable to test atrial threshold d/t Aflutter Known parox AFib episodes starting 12/16 Now seems to be in persistent Aflutter Ventricular histograms appropriate No changes  made today  EKG is ordered. Personal review of EKG from today shows:  Aflutter, V-paced rate 61bpm  Recent Labs: 01/05/2022: Magnesium 2.6 03/29/2022: ALT 10; BUN 17; Creat 1.14; Hemoglobin 11.0; Platelets 280; Potassium 3.9; Sodium 138; TSH 5.79  05/08/2021: Cholesterol 193; HDL 78; LDL Cholesterol (Calc) 99; Total CHOL/HDL Ratio 2.5; Triglycerides 75   Estimated Creatinine Clearance: 45.8 mL/min (by C-G formula based on SCr of 1.14 mg/dL).   Wt Readings from Last 3 Encounters:  04/09/22 181 lb (82.1 kg)  03/29/22 177 lb (80.3 kg)  02/16/22 181 lb (82.1 kg)     Additional studies reviewed include: Previous EP, cardiology notes.   TTE, 07/08/2020 1. Left ventricular ejection fraction, by estimation, is 50 to 55%. The left ventricle has low normal function. The left ventricle has no regional wall motion abnormalities. There is mild left ventricular hypertrophy. Left ventricular diastolic parameters are consistent with Grade I diastolic dysfunction (impaired relaxation). Elevated left atrial pressure.  2. Right ventricular systolic  function is normal. The right ventricular size is normal. There is mildly elevated pulmonary artery systolic pressure.  3. Left atrial size was severely dilated.  4. The mitral valve is normal in structure. Trivial mitral valve regurgitation. No evidence of mitral stenosis. Moderate mitral annular calcification.  5. The aortic valve is tricuspid. There is mild calcification of the aortic valve. There is moderate thickening of the aortic valve. Aortic valve regurgitation is not visualized. Mild to moderate aortic valve sclerosis/calcification is present, without any evidence of aortic stenosis.  6. There is mild dilatation of the ascending aorta, measuring 39 mm.  ASSESSMENT AND PLAN:  #) parox Afib #) Hypercoag d/t Afib No symptoms of atrial arrhythmia  Minimal burden by device until mid-December, has been quite persistent since then Patient is agreeable to ATP in clinic, DCCV in hospital if does not work Unfortunately, he thinks he missed a dose of xarelto within the past 3 weeks Recommended he diligent take xarelto daily and follow-up in clinic for ATP  CHA2DS2-VASc Score = 8 [CHF History: 1, HTN History: 1, Diabetes History: 1, Stroke History: 2, Vascular Disease History: 1, Age Score: 2, Gender Score: 0].  Therefore, the patient's annual risk of stroke is 10.8 %. [place] - xarelto 29m, dosed appropriately for CrCl 52.8 1}   #) CHB s/p PPM Device functioning well, see paceart for details No changes made  #) CAD No symptoms, mgmt per gen cards  #)HTN Well controlled    Current medicines are reviewed at length with the patient today.   The patient does not have concerns regarding his medicines.  The following changes were made today:  none  Labs/ tests ordered today include:  Orders Placed This Encounter  Procedures   EKG 12-Lead     Disposition: Follow up with EP APP in in 4 weeks   Signed, SMamie Levers NP  04/09/22  9:06 AM  Electrophysiology [facility]

## 2022-04-09 ENCOUNTER — Encounter: Payer: Self-pay | Admitting: Cardiology

## 2022-04-09 ENCOUNTER — Ambulatory Visit: Payer: Medicare Other | Attending: Physician Assistant | Admitting: Cardiology

## 2022-04-09 VITALS — BP 128/72 | HR 61 | Ht 68.5 in | Wt 181.0 lb

## 2022-04-09 DIAGNOSIS — I10 Essential (primary) hypertension: Secondary | ICD-10-CM

## 2022-04-09 DIAGNOSIS — Z7901 Long term (current) use of anticoagulants: Secondary | ICD-10-CM | POA: Diagnosis not present

## 2022-04-09 DIAGNOSIS — I2510 Atherosclerotic heart disease of native coronary artery without angina pectoris: Secondary | ICD-10-CM

## 2022-04-09 DIAGNOSIS — Z950 Presence of cardiac pacemaker: Secondary | ICD-10-CM | POA: Diagnosis not present

## 2022-04-09 DIAGNOSIS — I480 Paroxysmal atrial fibrillation: Secondary | ICD-10-CM

## 2022-04-09 DIAGNOSIS — I441 Atrioventricular block, second degree: Secondary | ICD-10-CM

## 2022-04-09 DIAGNOSIS — I5032 Chronic diastolic (congestive) heart failure: Secondary | ICD-10-CM

## 2022-04-09 LAB — CUP PACEART INCLINIC DEVICE CHECK
Battery Remaining Longevity: 136 mo
Battery Voltage: 3.03 V
Brady Statistic AP VP Percent: 25.89 %
Brady Statistic AP VS Percent: 0.03 %
Brady Statistic AS VP Percent: 71.88 %
Brady Statistic AS VS Percent: 2.11 %
Brady Statistic RA Percent Paced: 17.49 %
Brady Statistic RV Percent Paced: 91.85 %
Date Time Interrogation Session: 20240119142409
Implantable Lead Connection Status: 753985
Implantable Lead Connection Status: 753985
Implantable Lead Implant Date: 20220610
Implantable Lead Implant Date: 20220610
Implantable Lead Location: 753859
Implantable Lead Location: 753860
Implantable Lead Model: 3830
Implantable Lead Model: 5076
Implantable Pulse Generator Implant Date: 20220610
Lead Channel Impedance Value: 266 Ohm
Lead Channel Impedance Value: 323 Ohm
Lead Channel Impedance Value: 323 Ohm
Lead Channel Impedance Value: 513 Ohm
Lead Channel Pacing Threshold Amplitude: 0.75 V
Lead Channel Pacing Threshold Amplitude: 0.75 V
Lead Channel Pacing Threshold Pulse Width: 0.4 ms
Lead Channel Pacing Threshold Pulse Width: 0.4 ms
Lead Channel Sensing Intrinsic Amplitude: 1.375 mV
Lead Channel Sensing Intrinsic Amplitude: 1.75 mV
Lead Channel Sensing Intrinsic Amplitude: 8.625 mV
Lead Channel Sensing Intrinsic Amplitude: 8.75 mV
Lead Channel Setting Pacing Amplitude: 1.5 V
Lead Channel Setting Pacing Amplitude: 2 V
Lead Channel Setting Pacing Pulse Width: 0.4 ms
Lead Channel Setting Sensing Sensitivity: 1.2 mV
Zone Setting Status: 755011

## 2022-04-09 NOTE — Patient Instructions (Signed)
Medication Instructions:   Your physician recommends that you continue on your current medications as directed. Please refer to the Current Medication list given to you today.   *If you need a refill on your cardiac medications before your next appointment, please call your pharmacy*   Lab Work: [place]   If you have labs (blood work) drawn today and your tests are completely normal, you will receive your results only by: [place] (if you have MyChart) OR A paper copy in the mail If you have any lab test that is abnormal or we need to change your treatment, we will call you to review the results.   Testing/Procedures: NONE ORDERED  TODAY     Follow-Up: At [facility], you and your health needs are our priority.  As part of our continuing mission to provide you with exceptional heart care, we have created designated Provider Care Teams.  These Care Teams include your primary Cardiologist (physician) and Advanced Practice Providers (APPs -  Physician Assistants and Nurse Practitioners) who all work together to provide you with the care you need, when you need it.  We recommend signing up for the patient portal called "MyChart".  Sign up information is provided on this After Visit Summary.  MyChart is used to connect with patients for Virtual Visits (Telemedicine).  Patients are able to view lab/test results, encounter notes, upcoming appointments, etc.  Non-urgent messages can be sent to your provider as well.   To learn more about what you can do with MyChart, go to [email or web address].    Your next appointment:   4 week(s)  Provider:   You may see  :   Tommye Standard, PA-C [facility] [address] [place], [place] Mamie Levers, NP    Other Instructions

## 2022-04-22 NOTE — Progress Notes (Signed)
Remote pacemaker transmission.   

## 2022-04-27 DIAGNOSIS — C44319 Basal cell carcinoma of skin of other parts of face: Secondary | ICD-10-CM | POA: Diagnosis not present

## 2022-05-05 ENCOUNTER — Telehealth: Payer: Self-pay | Admitting: Pharmacist

## 2022-05-05 NOTE — Progress Notes (Signed)
Care Management & Coordination Services Pharmacy Team  Reason for Encounter: Appointment Reminder  Contacted patient to confirm telephone appointment with Leata Mouse, PharmD on 05/06/22 at 12PM. Spoke with patient on 05/05/2022   Future Appointments  Date Time Provider [place]  05/06/2022 12:00 PM Edythe Clarity, [place] None  05/10/2022 11:40 AM Shirley Friar, PA-C [facility]  06/30/2022  7:05 AM [facility] DEVICE REMOTES [facility]  08/05/2022  3:45 PM Alric Ran, MD [facility] None  09/29/2022  7:05 AM [facility] DEVICE REMOTES [facility]  12/29/2022  7:05 AM [facility] DEVICE REMOTES [facility]  03/30/2023  7:05 AM [facility] DEVICE REMOTES [facility]  06/29/2023  7:05 AM [facility] DEVICE REMOTES [facility]  09/28/2023  7:05 AM [facility] DEVICE REMOTES [facility]   [facility]

## 2022-05-06 ENCOUNTER — Ambulatory Visit: Payer: Medicare Other | Admitting: Pharmacist

## 2022-05-06 NOTE — Progress Notes (Signed)
Care Management & Coordination Services Pharmacy Note  05/06/2022 Name:  [name] MRN:  [id number] DOB:  [birth year]  Summary: PharmD Fu visit.  Patient doing well, only complaint is his weakness in legs.  He is using walker to ambulate.  Cautioned on fall precautions.  No changes recommended to medication at this time.  Recommendations/Changes made from today's visit: None  Follow up plan: FU 6 months   Subjective: [name] is an 89 y.o. year old male who is a primary patient of Pickard, Cammie Mcgee, MD.  The care coordination team was consulted for assistance with disease management and care coordination needs.    Engaged with patient by telephone for follow up visit.  Recent office visits:  01/01/2022 OV( (PCP) [name], MD;  I will increase his Keppra to 500 mg twice daily and then reassess in 7 to 10 days.    Recent consult visits:  None   Hospital visits:  01/05/2022 ED visit for Spell of generalized weakness -No medication changes noted.   Objective:  Lab Results  Component Value Date   CREATININE 1.14 03/29/2022   BUN 17 03/29/2022   EGFR 63 03/29/2022   GFRNONAA 58 (L) 01/05/2022   GFRAA 76 07/15/2020   NA 138 03/29/2022   K 3.9 03/29/2022   CALCIUM 8.8 03/29/2022   CO2 26 03/29/2022   GLUCOSE 186 (H) 03/29/2022    Lab Results  Component Value Date/Time   HGBA1C 6.3 (H) 03/29/2022 02:38 PM   HGBA1C 5.8 (H) 05/08/2021 11:12 AM   MICROALBUR 1.1 07/24/2015 09:27 AM   MICROALBUR 0.5 11/15/2014 09:15 AM    Last diabetic Eye exam:  Lab Results  Component Value Date/Time   HMDIABEYEEXA No Retinopathy 09/26/2017 12:00 AM    Last diabetic Foot exam: No results found for: "HMDIABFOOTEX"   Lab Results  Component Value Date   CHOL 193 05/08/2021   HDL 78 05/08/2021   LDLCALC 99 05/08/2021   TRIG 75 05/08/2021   CHOLHDL 2.5 05/08/2021       Latest Ref Rng & Units 03/29/2022    2:38 PM 01/05/2022    4:15 PM 09/29/2021    1:33 AM   Hepatic Function  Total Protein 6.1 - 8.1 g/dL 6.3  6.2  6.3   Albumin 3.5 - 5.0 g/dL  3.7  3.9   AST 10 - 35 U/L 15  20  23   $ ALT 9 - 46 U/L 10  16  19   $ Alk Phosphatase 38 - 126 U/L  79  81   Total Bilirubin 0.2 - 1.2 mg/dL 0.4  0.3  0.5     Lab Results  Component Value Date/Time   TSH 5.79 (H) 03/29/2022 02:38 PM   TSH 2.959 01/05/2022 04:15 PM   TSH 8.39 (H) 05/08/2021 11:12 AM       Latest Ref Rng & Units 03/29/2022    2:38 PM 01/05/2022    4:15 PM 09/29/2021    1:33 AM  CBC  WBC 3.8 - 10.8 Thousand/uL 8.2  6.2  10.5   Hemoglobin 13.2 - 17.1 g/dL 11.0  10.3  13.1   Hematocrit 38.5 - 50.0 % 32.7  31.3  39.6   Platelets 140 - 400 Thousand/uL 280  258  234     Lab Results  Component Value Date/Time   VITAMINB12 591 06/01/2018 03:23 AM   VITAMINB12 1,951 (H) 11/01/2013 11:04 AM    Clinical ASCVD: Yes  The ASCVD Risk score (Arnett DK,  et al., 2019) failed to calculate for the following reasons:   The 2019 ASCVD risk score is only valid for ages 61 to 65   The patient has a prior MI or stroke diagnosis        03/29/2022    2:11 PM 03/12/2022   12:15 PM 01/01/2022   11:12 AM  Depression screen PHQ 2/9  Decreased Interest 0 0 0  Down, Depressed, Hopeless 0 0 0  PHQ - 2 Score 0 0 0     Social History   Tobacco Use  Smoking Status Former   Years: 5.00   Types: Cigarettes   Quit date: 08/19/1956   Years since quitting: 65.7  Smokeless Tobacco Former   Types: Chew   BP Readings from Last 3 Encounters:  04/09/22 128/72  03/29/22 (!) 140/78  02/16/22 (!) 140/60   Pulse Readings from Last 3 Encounters:  04/09/22 61  03/29/22 87  02/16/22 68   Wt Readings from Last 3 Encounters:  04/09/22 181 lb (82.1 kg)  03/29/22 177 lb (80.3 kg)  02/16/22 181 lb (82.1 kg)   BMI Readings from Last 3 Encounters:  04/09/22 27.12 kg/m  03/29/22 26.91 kg/m  02/16/22 27.52 kg/m    Allergies  Allergen Reactions   Penicillins     Did it involve swelling of the  face/tongue/throat, SOB, or low BP? Unknown Did it involve sudden or severe rash/hives, skin peeling, or any reaction on the inside of your mouth or nose? Unknown Did you need to seek medical attention at a hospital or doctor's office? No When did it last happen?      Decades Ago If all above answers are "NO", may proceed with cephalosporin use.      Medications Reviewed Today     Reviewed by Edythe Clarity, [facility] (Pharmacist) on 05/06/22 at 1222  Med List Status: <None>   Medication Order Taking? Sig Documenting Provider Last Dose Status Informant  acetaminophen (TYLENOL) 325 MG tablet [id number] Yes Take 1-2 tablets (325-650 mg total) by mouth every 4 (four) hours as needed for mild pain. Bary Leriche, PA-C Taking Active Self, Pharmacy Records  amLODipine (NORVASC) 10 MG tablet [id number] Yes Take 1 tablet (10 mg total) by mouth daily. [name], MD Taking Active Self, Pharmacy Records  atorvastatin (LIPITOR) 80 MG tablet [id number] Yes TAKE 1 TABLET BY MOUTH EVERY DAY AT 6 PM [name], MD Taking Active   azithromycin [facility]) 250 MG tablet [id number] Yes 2 tabs poqday1, 1 tab poqday 2-5 [name], MD Taking Active   carbamazepine (CARBATROL) 300 MG 12 hr capsule [id number] Yes TAKE 1 CAPSULE(300 MG) BY MOUTH TWICE DAILY  Patient taking differently: Take 300 mg by mouth 2 (two) times daily.   [name], MD Taking Active Self, Pharmacy Records  cloNIDine (CATAPRES) 0.2 MG tablet [id number] Yes TAKE 1 TABLET(0.2 MG) BY MOUTH TWICE DAILY  Patient taking differently: Take 0.2 mg by mouth daily.   [name], MD Taking Active Self, Pharmacy Records  donepezil (ARICEPT) 5 MG tablet [id number] Yes TAKE 1 TABLET(5 MG) BY MOUTH AT BEDTIME [name], MD Taking Active   doxycycline (VIBRA-TABS) 100 MG tablet [id number] Yes Take 1 tablet (100 mg total) by mouth 2 (two) times daily. Rubie Maid, FNP Taking Active   escitalopram (LEXAPRO) 5 MG tablet  [id number] Yes TAKE 1 TABLET BY MOUTH EVERY DAY AT BEDTIME [name], MD Taking Active   furosemide (LASIX) 20 MG tablet  [id number] Yes TAKE 1/2 TABLET(10 MG) BY MOUTH DAILY [name], MD Taking Active   glucose blood test strip [id number] Yes 1 each by Other route as needed. Use as instructed Buelah Manis, Modena Nunnery, MD Taking Active Self, Pharmacy Records  glucose monitoring kit (FREESTYLE) monitoring kit [id number] Yes 1 each by Does not apply route as needed. Alycia Rossetti, MD Taking Active Self, Pharmacy Records  isosorbide mononitrate (IMDUR) 60 MG 24 hr tablet [id number] Yes TAKE 1/2 TABLET BY MOUTH EVERY DAY [name], MD Taking Active Self, Pharmacy Records  Lancets (ACCU-CHEK SAFE-T PRO) lancets [id number] Yes 1 each by Other route as needed. Use as instructed Buelah Manis, Modena Nunnery, MD Taking Active Self, Pharmacy Records  levETIRAcetam (KEPPRA) 500 MG tablet [id number] Yes Take 1 tablet (500 mg total) by mouth 2 (two) times daily. [name], MD Taking Active Self, Pharmacy Records  levothyroxine (SYNTHROID) 88 MCG tablet [id number] Yes Take 1 tablet (88 mcg total) by mouth daily. [name], MD Taking Active Self, Pharmacy Records  losartan (COZAAR) 100 MG tablet [id number] Yes TAKE 1/2 TABLET BY MOUTH DAILY [name], MD Taking Active   Multiple Vitamins-Minerals (MULTIVITAMINS THER. W/MINERALS) Sheral Flow [id number] Yes Take 1 tablet by mouth at bedtime.   [provider] Taking Active Self, Pharmacy Records  MYRBETRIQ 50 MG TB24 tablet [id number] Yes Take 50 mg by mouth daily. [provider] Taking Active Self, Pharmacy Records           Med Note Jimmey Ralph, [place]   Mon Sep 28, 2021  6:02 PM) Hasn't started  rivaroxaban (XARELTO) 20 MG TABS tablet [id number] Yes Take 1 tablet (20 mg total) by mouth daily with supper. Minus Breeding, MD Taking Active Self, Pharmacy Records  tamsulosin [facility]) 0.4 MG CAPS capsule [id number] Yes TAKE 1  CAPSULE(0.4 MG) BY MOUTH DAILY AFTER AND SUPPER  Patient taking differently: Take 0.4 mg by mouth daily after supper.   [name], MD Taking Active Self, Pharmacy Records            SDOH:  (Social Determinants of Health) assessments and interventions performed: No Financial Resource Strain: High Risk (03/12/2022)   Overall Financial Resource Strain (CARDIA)    Difficulty of Paying Living Expenses: Hard   Food Insecurity: No Food Insecurity (03/12/2022)   Hunger Vital Sign    Worried About Running Out of Food in the Last Year: Never true    Ran Out of Food in the Last Year: Never true    SDOH Interventions    Flowsheet Row Clinical Support from 03/12/2022 in [place] Clinical Support from 03/06/2021 in [place] Interventions    Food Insecurity Interventions Intervention Not Indicated Intervention Not Indicated  Housing Interventions Intervention Not Indicated Intervention Not Indicated  Transportation Interventions Intervention Not Indicated Intervention Not Indicated  Utilities Interventions Intervention Not Indicated --  Alcohol Usage Interventions Intervention Not Indicated (Score <7) --  Financial Strain Interventions Intervention Not Indicated Intervention Not Indicated  Physical Activity Interventions Intervention Not Indicated Intervention Not Indicated  Stress Interventions Intervention Not Indicated Intervention Not Indicated  Social Connections Interventions -- Intervention Not Indicated       Medication Assistance: None required.  Patient affirms current coverage meets needs.  Medication Access: Within the past 30 days, how often has patient missed a dose of medication? 0 Is a pillbox or other method used to improve adherence? Yes  Factors that may affect medication adherence? no  barriers identified Are meds synced by current pharmacy? No  Are meds delivered by current pharmacy? No  Does patient  experience delays in picking up medications due to transportation concerns? No   [facility] Services Reviewed: Is patient disadvantaged to use [facility]?: Yes  Current Rx insurance plan: [place] Name and location of Current pharmacy:  [facility] [phone] - [place], [place] [id number] [place] [address] Phone: [phone] Fax: [phone]  [facility] services reviewed with patient today?: Yes  Patient requests to transfer care to [facility]?: No  Reason patient declined to change pharmacies: Loyalty to other pharmacy/Patient preference  Compliance/Adherence/Medication fill history: Care Gaps: Foot exam, eye esam  Star-Rating Drugs: Atorvastatin 3m 04/17/22 90ds Losartan 1018m02/05/24 90ds  Assessment/Plan                   Hypertension (BP goal <130/80) -Uncontrolled -Current treatment: Amlodipine 1018mppropriate, Query effective, ,  Losartan 100m22mpropriate, Query effective, ,  Clonidine 0.2mg 88mce daily Appropriate, Query effective, ,  -Medications previously tried: none noted  -Current home readings: mostly normal, occasional reading 160 s0000000olic -Denies hypotensive/hypertensive symptoms -Educated on BP goals and benefits of medications for prevention of heart attack, stroke and kidney damage; Daily salt intake goal < 2300 mg; Importance of home blood pressure monitoring; Symptoms of hypotension and importance of maintaining adequate hydration; -Counseled to monitor BP at home daily, document, and provide log at future appointments -Recommended to continue current medication - BP seems to be well controlled denies any dizziness or HA. No changes needed at this time.  Hyperlipidemia: (LDL goal < 70) 05/06/22 -Uncontrolled -Current treatment: Atorvastatin 80mg 23my Appropriate, Query effective, ,  -Medications previously tried: none noted  -LDL above goal for CAD,  Afib -Educated on Cholesterol goals;  Benefits of statin for ASCVD risk reduction; Importance of limiting foods high in cholesterol; -LDL 99 at last visit.  Tolerating statin well and reports he is adherent with it.  Could consider addition of Zetia for patient with goal < 70.   Patient Goals/Self-Care Activities Patient will:  - take medications as prescribed as evidenced by patient report and record review check blood pressure daily, document, and provide at future appointments target a minimum of 150 minutes of moderate intensity exercise weekly  Follow Up Plan: The care management team will reach out to the patient again over the next 180 days.       ChristBeverly MilchmD, CPP Clinical Pharmacist Practitioner [facility] [place] [phone]

## 2022-05-09 NOTE — Progress Notes (Unsigned)
Cardiology Office Note Date:  05/10/2022  Patient ID:  Daniel, [name] Mar 09, 1934, MRN [id number] PCP:  [name], MD  Cardiologist:  Minus Breeding, MD Electrophysiologist: [name], MD  Chief Complaint: 1year PM follow-up, past due; persistent Afib by device  History of Present Illness: [name] is a 89 y.o. male with PMH notable for intermittent CHB s/p PPM, parox AFib, HTN, CAD, Chronic diastolic HF, CVA.; seen today for [name], MD for routine electrophysiology followup. Since last being seen in our clinic the patient reports doing very well.    I saw him 03/2022, having persistent AF by device, no overt symptoms. Offered to ATP in clinic, but patient had missed a recent dose of xarelto. He follows up today. No palpitations, chest pain, or fluttering in chest. He is pretty sure he has taken xarelto everyday without missing a dose, but is not sure.   He denies bleeding concerns He checks BP at home and is usually 140s/70s, was 160 this AM.  Device Information: MDT dual chamber PM, impl 08/2020; dx symptomatic CHB   Past Medical History:  Diagnosis Date   Atrial flutter (HCC)    BPH (benign prostatic hyperplasia)    Coronary artery disease    a. s/p MI tx with Cypher DES to pRCA in 4/04;  b. Echocardiogram 7/09: Normal LV function.  c. Nuclear study 3/13 no ischemia;  d. ETT 2/14 neg;  e. admx with CP => LHC (01/30/2013):  pLAD 40-50%, oD1 70-80%, oOM1 40%, pRCA stent patent, mid RCA 30%, EF 60-65%. => med Rx.   DDD (degenerative disc disease)    Diabetes mellitus without complication ([place])    Dyslipidemia    Gait disorder 07/13/2018   Hemorrhoids    Hx MRSA infection    left buttocks abscess   Hx of echocardiogram    a. Echocardiogram (01/31/2013): Mild focal basal and mild concentric hypertrophy of the septum, EF 50-55%, normal wall motion, grade 1 diastolic dysfunction, trivial AI, MAC, mild LAE, PASP 35   Hyperlipidemia    Hypertension     Hypothyroidism    Meniere's disease    Status post shunt   Myocardial infarction [facility]) 2008   Occlusion and stenosis of carotid artery without mention of cerebral infarction    40-59% on carotid doppler 2014; [place] (01/2013): R 1-39%, L 60-79%   Rotator cuff injury    chronic rotator cuff injury status post repair   Seizure disorder ([place])    Stroke ([place])    a. 01/2013=> post cardiac cath CVA to L post communicating artery system; R sided weakness   Syncope     Past Surgical History:  Procedure Laterality Date   COLONOSCOPY     CORONARY ANGIOPLASTY WITH STENT PLACEMENT  07/14/2002   Stent to the right coronary artery   ENDOLYMPHATIC SHUNT DECOMPRESSION  06/11/2009    Right endolymphatic sac decompression and shunt  placement   HERNIA REPAIR  04/10/2008   scrotal hernia repair   LEFT HEART CATHETERIZATION WITH CORONARY ANGIOGRAM N/A 01/30/2013   Procedure: LEFT HEART CATHETERIZATION WITH CORONARY ANGIOGRAM;  Surgeon: Larey Dresser, MD;  Location: [facility] CATH LAB;  Service: Cardiovascular;  Laterality: N/A;   PACEMAKER IMPLANT N/A 08/29/2020   Procedure: PACEMAKER IMPLANT;  Surgeon: [name], MD;  Location: [place] CV LAB;  Service: Cardiovascular;  Laterality: N/A;   ROTATOR CUFF REPAIR     for chronic rotator cuff injury    Current Outpatient Medications  Medication Instructions  acetaminophen (TYLENOL) 325-650 mg, Oral, Every 4 hours PRN   amLODipine (NORVASC) 10 mg, Oral, Daily   atorvastatin (LIPITOR) 80 MG tablet TAKE 1 TABLET BY MOUTH EVERY DAY AT 6 PM   azithromycin (ZITHROMAX) 250 MG tablet 2 tabs poqday1, 1 tab poqday 2-5   carbamazepine (CARBATROL) 300 MG 12 hr capsule TAKE 1 CAPSULE(300 MG) BY MOUTH TWICE DAILY   cloNIDine (CATAPRES) 0.2 MG tablet TAKE 1 TABLET(0.2 MG) BY MOUTH TWICE DAILY   donepezil (ARICEPT) 5 MG tablet TAKE 1 TABLET(5 MG) BY MOUTH AT BEDTIME   doxycycline (VIBRA-TABS) 100 mg, Oral, 2 times daily   escitalopram (LEXAPRO) 5 MG tablet TAKE 1  TABLET BY MOUTH EVERY DAY AT BEDTIME   furosemide (LASIX) 20 MG tablet TAKE 1/2 TABLET(10 MG) BY MOUTH DAILY   glucose blood test strip 1 each, Other, As needed, Use as instructed   glucose monitoring kit (FREESTYLE) monitoring kit 1 each, Does not apply, As needed   isosorbide mononitrate (IMDUR) 30 mg, Oral, Daily   Lancets (ACCU-CHEK SAFE-T PRO) lancets 1 each, Other, As needed, Use as instructed   levETIRAcetam (KEPPRA) 500 mg, Oral, 2 times daily   levothyroxine (SYNTHROID) 88 mcg, Oral, Daily   losartan (COZAAR) 100 mg, Oral, Daily   Multiple Vitamins-Minerals (MULTIVITAMINS THER. W/MINERALS) TABS 1 tablet, Oral, Daily at bedtime,     Myrbetriq 50 mg, Oral, Daily   rivaroxaban (XARELTO) 20 mg, Oral, Daily with supper   tamsulosin (FLOMAX) 0.4 MG CAPS capsule TAKE 1 CAPSULE(0.4 MG) BY MOUTH DAILY AFTER AND SUPPER    Social History:  The patient  reports that he quit smoking about 65 years ago. His smoking use included cigarettes. He has quit using smokeless tobacco.  His smokeless tobacco use included chew. He reports that he does not drink alcohol and does not use drugs.   Family History:  The patient's family history includes Aneurysm in his mother; Coronary artery disease in his brother; Diabetes in his brother; Heart attack (age of onset: 74) in his father.  ROS:  Please see the history of present illness. All other systems are reviewed and otherwise negative.   PHYSICAL EXAM:   Vitals:   05/10/22 1142 05/10/22 1232  BP: (!) 160/64 (!) 172/70  Pulse: 89   Height: 5' 9"$  (1.753 m)   Weight: 180 lb (81.6 kg)   SpO2: 98%   BMI (Calculated): 26.57      GEN- The patient is well appearing, alert and oriented x 3 today.   HEENT: normocephalic, atraumatic; sclera clear, conjunctiva pink; hearing intact; oropharynx clear; neck supple, no JVP Lungs- Clear to ausculation bilaterally, normal work of breathing.  No wheezes, rales, rhonchi Heart- Regular rate and rhythm, no murmurs,  rubs or gallops, PMI not laterally displaced GI- soft, non-tender, non-distended, bowel sounds present, no hepatosplenomegaly Extremities- No peripheral edema. no clubbing or cyanosis; DP/PT/radial pulses 2+ bilaterally MS- no significant deformity or atrophy Skin- warm and dry, no rash or lesion, device pocket well-healed Psych- euthymic mood, full affect Neuro- strength and sensation are intact   Device interrogation done today and reviewed by myself:  Battery  Lead thresholds, impedence, sensing stable  Unable to test atrial threshold d/t Aflutter Near-permanent AF since 02/2022, appears Flutter Ventricular histograms appropriate No changes made today  EKG is ordered.Personal review of today's EKG shows: Atypical atrial flutter, V-paced, rate 68bpm  Recent Labs: 01/05/2022: Magnesium 2.6 03/29/2022: ALT 10; BUN 17; Creat 1.14; Hemoglobin 11.0; Platelets 280; Potassium 3.9; Sodium 138; TSH 5.79  No results found for requested labs within last 365 days.   CrCl cannot be calculated (Patient's most recent lab result is older than the maximum 21 days allowed.).   Wt Readings from Last 3 Encounters:  05/10/22 180 lb (81.6 kg)  04/09/22 181 lb (82.1 kg)  03/29/22 177 lb (80.3 kg)     Additional studies reviewed include: Previous EP, cardiology notes.   TTE, 07/08/2020 1. Left ventricular ejection fraction, by estimation, is 50 to 55%. The left ventricle has low normal function. The left ventricle has no regional wall motion abnormalities. There is mild left ventricular hypertrophy. Left ventricular diastolic parameters are consistent with Grade I diastolic dysfunction (impaired relaxation). Elevated left atrial pressure.  2. Right ventricular systolic function is normal. The right ventricular size is normal. There is mildly elevated pulmonary artery systolic pressure.  3. Left atrial size was severely dilated.  4. The mitral valve is normal in structure. Trivial mitral valve  regurgitation. No evidence of mitral stenosis. Moderate mitral annular calcification.  5. The aortic valve is tricuspid. There is mild calcification of the aortic valve. There is moderate thickening of the aortic valve. Aortic valve regurgitation is not visualized. Mild to moderate aortic valve sclerosis/calcification is present, without any evidence of aortic stenosis.  6. There is mild dilatation of the ascending aorta, measuring 39 mm.  ASSESSMENT AND PLAN:  #) parox Afib #) Hypercoag d/t Afib No symptoms of atrial arrhythmia  Minimal burden by device until mid-December, has been quite persistent since then Discussed ATP in clinic, but deferred since patient is unsure of potential missed dose of xarelto Will set him up with TEE/DCCV next available - update labs today including Mag  CHA2DS2-VASc Score = 8 [CHF History: 1, HTN History: 1, Diabetes History: 1, Stroke History: 2, Vascular Disease History: 1, Age Score: 2, Gender Score: 0].  Therefore, the patient's annual risk of stroke is 10.8 %. [place] - xarelto 7m, dosed appropriately for CrCl >522mmin 1}   #) CHB s/p PPM Device functioning well, see paceart for details No changes made   #) CAD No symptoms, mgmt per gen cards  #) HTN Elevated in office, remained elevated on re-check Also elevated on home readings - will increase losartan to 100103m  Current medicines are reviewed at length with the patient today.   The patient does not have concerns regarding his medicines.  The following changes were made today:   INCREASE losartan 100m87mily  Labs/ tests ordered today include:  Orders Placed This Encounter  Procedures   Basic metabolic panel   CBC   Magnesium   CUP PACEART INCLINIC DEVICE CHECK   EKG 12-Lead     Disposition: Follow up with EP APP or AF clinic in 2-4 weeks after DCCV   Signed, SuzaMamie Levers  05/10/22  12:32 PM  Electrophysiology [facility]

## 2022-05-09 NOTE — H&P (View-Only) (Signed)
Cardiology Office Note Date:  05/10/2022  Patient ID:  [name], [name] [birth year], MRN [id number] PCP:  [name], MD  Cardiologist:  Minus Breeding, MD Electrophysiologist: [name], MD  Chief Complaint: 1year PM follow-up, past due; persistent Afib by device  History of Present Illness: [name] is a 89 y.o. male with PMH notable for intermittent CHB s/p PPM, parox AFib, HTN, CAD, Chronic diastolic HF, CVA.; seen today for [name], MD for routine electrophysiology followup. Since last being seen in our clinic the patient reports doing very well.    I saw him 03/2022, having persistent AF by device, no overt symptoms. Offered to ATP in clinic, but patient had missed a recent dose of xarelto. He follows up today. No palpitations, chest pain, or fluttering in chest. He is pretty sure he has taken xarelto everyday without missing a dose, but is not sure.   He denies bleeding concerns He checks BP at home and is usually 140s/70s, was 160 this AM.  Device Information: MDT dual chamber PM, impl 08/2020; dx symptomatic CHB   Past Medical History:  Diagnosis Date   Atrial flutter (HCC)    BPH (benign prostatic hyperplasia)    Coronary artery disease    a. s/p MI tx with Cypher DES to pRCA in 4/04;  b. Echocardiogram 7/09: Normal LV function.  c. Nuclear study 3/13 no ischemia;  d. ETT 2/14 neg;  e. admx with CP => LHC (01/30/2013):  pLAD 40-50%, oD1 70-80%, oOM1 40%, pRCA stent patent, mid RCA 30%, EF 60-65%. => med Rx.   DDD (degenerative disc disease)    Diabetes mellitus without complication ([place])    Dyslipidemia    Gait disorder 07/13/2018   Hemorrhoids    Hx MRSA infection    left buttocks abscess   Hx of echocardiogram    a. Echocardiogram (01/31/2013): Mild focal basal and mild concentric hypertrophy of the septum, EF 50-55%, normal wall motion, grade 1 diastolic dysfunction, trivial AI, MAC, mild LAE, PASP 35   Hyperlipidemia    Hypertension     Hypothyroidism    Meniere's disease    Status post shunt   Myocardial infarction [facility]) 2008   Occlusion and stenosis of carotid artery without mention of cerebral infarction    40-59% on carotid doppler 2014; [place] (01/2013): R 1-39%, L 60-79%   Rotator cuff injury    chronic rotator cuff injury status post repair   Seizure disorder ([place])    Stroke ([place])    a. 01/2013=> post cardiac cath CVA to L post communicating artery system; R sided weakness   Syncope     Past Surgical History:  Procedure Laterality Date   COLONOSCOPY     CORONARY ANGIOPLASTY WITH STENT PLACEMENT  07/14/2002   Stent to the right coronary artery   ENDOLYMPHATIC SHUNT DECOMPRESSION  06/11/2009    Right endolymphatic sac decompression and shunt  placement   HERNIA REPAIR  04/10/2008   scrotal hernia repair   LEFT HEART CATHETERIZATION WITH CORONARY ANGIOGRAM N/A 01/30/2013   Procedure: LEFT HEART CATHETERIZATION WITH CORONARY ANGIOGRAM;  Surgeon: Larey Dresser, MD;  Location[facility] C[facility] LAB;  Service: Cardiovascular;  Laterality: N/A;   PACEMAKER IMPLANT N/A 08/29/2020   Procedure: PACEMAKER IMPLANT;  Surgeon: [name], MD;  Location: [place] CV LAB;  Service: Cardiovascular;  Laterality: N/A;   ROTATOR CUFF REPAIR     for chronic rotator cuff injury    Current Outpatient Medications  Medication Instructions  acetaminophen (TYLENOL) 325-650 mg, Oral, Every 4 hours PRN   amLODipine (NORVASC) 10 mg, Oral, Daily   atorvastatin (LIPITOR) 80 MG tablet TAKE 1 TABLET BY MOUTH EVERY DAY AT 6 PM   azithromycin (ZITHROMAX) 250 MG tablet 2 tabs poqday1, 1 tab poqday 2-5   carbamazepine (CARBATROL) 300 MG 12 hr capsule TAKE 1 CAPSULE(300 MG) BY MOUTH TWICE DAILY   cloNIDine (CATAPRES) 0.2 MG tablet TAKE 1 TABLET(0.2 MG) BY MOUTH TWICE DAILY   donepezil (ARICEPT) 5 MG tablet TAKE 1 TABLET(5 MG) BY MOUTH AT BEDTIME   doxycycline (VIBRA-TABS) 100 mg, Oral, 2 times daily   escitalopram (LEXAPRO) 5 MG tablet TAKE 1  TABLET BY MOUTH EVERY DAY AT BEDTIME   furosemide (LASIX) 20 MG tablet TAKE 1/2 TABLET(10 MG) BY MOUTH DAILY   glucose blood test strip 1 each, Other, As needed, Use as instructed   glucose monitoring kit (FREESTYLE) monitoring kit 1 each, Does not apply, As needed   isosorbide mononitrate (IMDUR) 30 mg, Oral, Daily   Lancets (ACCU-CHEK SAFE-T PRO) lancets 1 each, Other, As needed, Use as instructed   levETIRAcetam (KEPPRA) 500 mg, Oral, 2 times daily   levothyroxine (SYNTHROID) 88 mcg, Oral, Daily   losartan (COZAAR) 100 mg, Oral, Daily   Multiple Vitamins-Minerals (MULTIVITAMINS THER. W/MINERALS) TABS 1 tablet, Oral, Daily at bedtime,     Myrbetriq 50 mg, Oral, Daily   rivaroxaban (XARELTO) 20 mg, Oral, Daily with supper   tamsulosin (FLOMAX) 0.4 MG CAPS capsule TAKE 1 CAPSULE(0.4 MG) BY MOUTH DAILY AFTER AND SUPPER    Social History:  The patient  reports that he quit smoking about 65 years ago. His smoking use included cigarettes. He has quit using smokeless tobacco.  His smokeless tobacco use included chew. He reports that he does not drink alcohol and does not use drugs.   Family History:  The patient's family history includes Aneurysm in his mother; Coronary artery disease in his brother; Diabetes in his brother; Heart attack (age of onset: 21) in his father.  ROS:  Please see the history of present illness. All other systems are reviewed and otherwise negative.   PHYSICAL EXAM:   Vitals:   05/10/22 1142 05/10/22 1232  BP: (!) 160/64 (!) 172/70  Pulse: 89   Height: '5\' 9"'$  (1.753 m)   Weight: 180 lb (81.6 kg)   SpO2: 98%   BMI (Calculated): 26.57      GEN- The patient is well appearing, alert and oriented x 3 today.   HEENT: normocephalic, atraumatic; sclera clear, conjunctiva pink; hearing intact; oropharynx clear; neck supple, no JVP Lungs- Clear to ausculation bilaterally, normal work of breathing.  No wheezes, rales, rhonchi Heart- Regular rate and rhythm, no murmurs,  rubs or gallops, PMI not laterally displaced GI- soft, non-tender, non-distended, bowel sounds present, no hepatosplenomegaly Extremities- No peripheral edema. no clubbing or cyanosis; DP/PT/radial pulses 2+ bilaterally MS- no significant deformity or atrophy Skin- warm and dry, no rash or lesion, device pocket well-healed Psych- euthymic mood, full affect Neuro- strength and sensation are intact   Device interrogation done today and reviewed by myself:  Battery  Lead thresholds, impedence, sensing stable  Unable to test atrial threshold d/t Aflutter Near-permanent AF since 02/2022, appears Flutter Ventricular histograms appropriate No changes made today  EKG is ordered.Personal review of today's EKG shows: Atypical atrial flutter, V-paced, rate 68bpm  Recent Labs: 01/05/2022: Magnesium 2.6 03/29/2022: ALT 10; BUN 17; Creat 1.14; Hemoglobin 11.0; Platelets 280; Potassium 3.9; Sodium 138; TSH 5.79  No results found for requested labs within last 365 days.   CrCl cannot be calculated (Patient's most recent lab result is older than the maximum 21 days allowed.).   Wt Readings from Last 3 Encounters:  05/10/22 180 lb (81.6 kg)  04/09/22 181 lb (82.1 kg)  03/29/22 177 lb (80.3 kg)     Additional studies reviewed include: Previous EP, cardiology notes.   TTE, 07/08/2020 1. Left ventricular ejection fraction, by estimation, is 50 to 55%. The left ventricle has low normal function. The left ventricle has no regional wall motion abnormalities. There is mild left ventricular hypertrophy. Left ventricular diastolic parameters are consistent with Grade I diastolic dysfunction (impaired relaxation). Elevated left atrial pressure.  2. Right ventricular systolic function is normal. The right ventricular size is normal. There is mildly elevated pulmonary artery systolic pressure.  3. Left atrial size was severely dilated.  4. The mitral valve is normal in structure. Trivial mitral valve  regurgitation. No evidence of mitral stenosis. Moderate mitral annular calcification.  5. The aortic valve is tricuspid. There is mild calcification of the aortic valve. There is moderate thickening of the aortic valve. Aortic valve regurgitation is not visualized. Mild to moderate aortic valve sclerosis/calcification is present, without any evidence of aortic stenosis.  6. There is mild dilatation of the ascending aorta, measuring 39 mm.  ASSESSMENT AND PLAN:  #) parox Afib #) Hypercoag d/t Afib No symptoms of atrial arrhythmia  Minimal burden by device until mid-December, has been quite persistent since then Discussed ATP in clinic, but deferred since patient is unsure of potential missed dose of xarelto Will set him up with TEE/DCCV next available - update labs today including Mag  CHA2DS2-VASc Score = 8 [CHF History: 1, HTN History: 1, Diabetes History: 1, Stroke History: 2, Vascular Disease History: 1, Age Score: 2, Gender Score: 0].  Therefore, the patient's annual risk of stroke is 10.8 %. [place] - xarelto '20mg'$ , dosed appropriately for CrCl >25m/min 1}   #) CHB s/p PPM Device functioning well, see paceart for details No changes made   #) CAD No symptoms, mgmt per gen cards  #) HTN Elevated in office, remained elevated on re-check Also elevated on home readings - will increase losartan to '100mg'$     Current medicines are reviewed at length with the patient today.   The patient does not have concerns regarding his medicines.  The following changes were made today:   INCREASE losartan '100mg'$  daily  Labs/ tests ordered today include:  Orders Placed This Encounter  Procedures   Basic metabolic panel   CBC   Magnesium   CUP PACEART INCLINIC DEVICE CHECK   EKG 12-Lead     Disposition: Follow up with EP APP or AF clinic in 2-4 weeks after DCCV   Signed, SMamie Levers NP  05/10/22  12:32 PM  Electrophysiology [facility]

## 2022-05-10 ENCOUNTER — Ambulatory Visit: Payer: Medicare Other | Attending: Student | Admitting: Cardiology

## 2022-05-10 ENCOUNTER — Encounter: Payer: Self-pay | Admitting: Student

## 2022-05-10 VITALS — BP 172/70 | HR 89 | Ht 69.0 in | Wt 180.0 lb

## 2022-05-10 DIAGNOSIS — Z950 Presence of cardiac pacemaker: Secondary | ICD-10-CM

## 2022-05-10 DIAGNOSIS — I2510 Atherosclerotic heart disease of native coronary artery without angina pectoris: Secondary | ICD-10-CM | POA: Diagnosis not present

## 2022-05-10 DIAGNOSIS — I10 Essential (primary) hypertension: Secondary | ICD-10-CM

## 2022-05-10 DIAGNOSIS — I480 Paroxysmal atrial fibrillation: Secondary | ICD-10-CM

## 2022-05-10 DIAGNOSIS — I442 Atrioventricular block, complete: Secondary | ICD-10-CM | POA: Insufficient documentation

## 2022-05-10 DIAGNOSIS — D6869 Other thrombophilia: Secondary | ICD-10-CM | POA: Diagnosis not present

## 2022-05-10 LAB — CUP PACEART INCLINIC DEVICE CHECK
Battery Remaining Longevity: 135 mo
Battery Voltage: 3.03 V
Brady Statistic RA Percent Paced: 0.01 %
Brady Statistic RV Percent Paced: 91.03 %
Date Time Interrogation Session: 20240219122235
Implantable Lead Connection Status: 753985
Implantable Lead Connection Status: 753985
Implantable Lead Implant Date: 20220610
Implantable Lead Implant Date: 20220610
Implantable Lead Location: 753859
Implantable Lead Location: 753860
Implantable Lead Model: 3830
Implantable Lead Model: 5076
Implantable Pulse Generator Implant Date: 20220610
Lead Channel Impedance Value: 266 Ohm
Lead Channel Impedance Value: 323 Ohm
Lead Channel Impedance Value: 323 Ohm
Lead Channel Impedance Value: 513 Ohm
Lead Channel Pacing Threshold Amplitude: 0.625 V
Lead Channel Pacing Threshold Amplitude: 0.75 V
Lead Channel Pacing Threshold Pulse Width: 0.4 ms
Lead Channel Pacing Threshold Pulse Width: 0.4 ms
Lead Channel Sensing Intrinsic Amplitude: 1.375 mV
Lead Channel Sensing Intrinsic Amplitude: 1.625 mV
Lead Channel Sensing Intrinsic Amplitude: 6.5 mV
Lead Channel Sensing Intrinsic Amplitude: 9 mV
Lead Channel Setting Pacing Amplitude: 1.5 V
Lead Channel Setting Pacing Amplitude: 2 V
Lead Channel Setting Pacing Pulse Width: 0.4 ms
Lead Channel Setting Sensing Sensitivity: 1.2 mV
Zone Setting Status: 755011

## 2022-05-10 MED ORDER — LOSARTAN POTASSIUM 100 MG PO TABS: 100.0000 mg | ORAL_TABLET | Freq: Every day | ORAL | 3 refills | Status: DC

## 2022-05-10 NOTE — Patient Instructions (Signed)
Medication Instructions:  Your physician has recommended you make the following change in your medication:   INCREASE: Losartan to 130m daily  *If you need a refill on your cardiac medications before your next appointment, please call your pharmacy*   Lab Work: None Today If you have labs (blood work) drawn today and your tests are completely normal, you will receive your results only by: [place](if you have MyChart) OR A paper copy in the mail If you have any lab test that is abnormal or we need to change your treatment, we will call you to review the results.   Follow-Up: [facility] you and your health needs are our priority.  As part of our continuing mission to provide you with exceptional heart care, we have created designated Provider Care Teams.  These Care Teams include your primary Cardiologist (physician) and Advanced Practice Providers (APPs -  Physician Assistants and Nurse Practitioners) who all work together to provide you with the care you need, when you need it.  Your next appointment:   2 week(s) after Cardioversion on 05/26/22  Provider:   [facility] Your provider will be: DRoderic Palau NP or Clint R. Fenton, PA-C    Other Instructions See letter for TEE/Cardioversion Instructions

## 2022-05-11 ENCOUNTER — Telehealth: Payer: Self-pay | Admitting: Internal Medicine

## 2022-05-11 ENCOUNTER — Encounter: Payer: Self-pay | Admitting: Cardiology

## 2022-05-11 NOTE — Telephone Encounter (Signed)
Error

## 2022-05-11 NOTE — Telephone Encounter (Signed)
Patient's daughter Jeannene Patella is calling requesting this ETT be rescheduled for 03/07 preferably not early morning due to her being the one bringing him. Please advise.

## 2022-05-12 ENCOUNTER — Other Ambulatory Visit: Payer: Self-pay | Admitting: Family Medicine

## 2022-05-12 NOTE — Telephone Encounter (Signed)
Patient's daughter Jeannene Patella is calling to find out if his TEE can be reschedule to March 7th. If possible she would like to hear back today. Please advise.

## 2022-05-13 NOTE — Telephone Encounter (Signed)
Called pt daughter Tonye Royalty back regarding reschedule question for TEE.    I shared TEE  / Endo telephone number with daughter to call, and see if it is possible to reschedule this test, for a different day the same week.    Ms. Jeannene Patella appreciated the call back, and the telephone number to call.

## 2022-05-14 ENCOUNTER — Other Ambulatory Visit: Payer: Self-pay | Admitting: Family Medicine

## 2022-05-14 NOTE — Telephone Encounter (Signed)
Requested Prescriptions  Pending Prescriptions Disp Refills   tamsulosin (FLOMAX) 0.4 MG CAPS capsule [Pharmacy Med Name: TAMSULOSIN 0.'4MG'$  CAPSULES] 90 capsule     Sig: TAKE 1 CAPSULE(0.4 MG) BY MOUTH DAILY AFTER SUPPER     Urology: Alpha-Adrenergic Blocker Failed - 05/14/2022  6:37 AM      Failed - PSA in normal range and within 360 days    PSA  Date Value Ref Range Status  07/24/2015 5.02 (H) <=4.00 ng/mL Final    Comment:    Test Methodology: ECLIA PSA (Electrochemiluminescence Immunoassay)   For PSA values from 2.5-4.0, particularly in younger men <60 years old, the AUA and NCCN suggest testing for % Free PSA (3515) and evaluation of the rate of increase in PSA (PSA velocity).          Failed - Last BP in normal range    BP Readings from Last 1 Encounters:  05/10/22 (!) 172/70         Failed - Valid encounter within last 12 months    Recent Outpatient Visits           1 year ago Essential hypertension   [place], Cammie Mcgee, MD   1 year ago Hematuria, unspecified type   [place] [name], MD   1 year ago Paroxysmal atrial fibrillation [facility])   [place] Dennard Schaumann, Cammie Mcgee, MD   1 year ago Paroxysmal atrial fibrillation [facility])   [place] Dennard Schaumann, Cammie Mcgee, MD   1 year ago Herpes zoster without complication   [place] Noemi Chapel A, NP               cloNIDine (CATAPRES) 0.2 MG tablet [Pharmacy Med Name: CLONIDINE 0.'2MG'$  TABLETS] 180 tablet     Sig: TAKE 1 TABLET(0.2 MG) BY MOUTH TWICE DAILY     Cardiovascular:  Alpha-2 Agonists Failed - 05/14/2022  6:37 AM      Failed - Last BP in normal range    BP Readings from Last 1 Encounters:  05/10/22 (!) 172/70         Failed - Valid encounter within last 6 months    Recent Outpatient Visits           1 year ago Essential hypertension   [place], Cammie Mcgee, MD   1 year ago  Hematuria, unspecified type   [place] [name], MD   1 year ago Paroxysmal atrial fibrillation [facility])   [place] Dennard Schaumann, Cammie Mcgee, MD   1 year ago Paroxysmal atrial fibrillation [facility])   [place] Dennard Schaumann, Cammie Mcgee, MD   1 year ago Herpes zoster without complication   [place] Eulogio Bear, NP              Passed - Last Heart Rate in normal range    Pulse Readings from Last 1 Encounters:  05/10/22 89         Signed Prescriptions Disp Refills   levothyroxine (SYNTHROID) 88 MCG tablet 90 tablet 3    Sig: TAKE 1 TABLET(88 MCG) BY MOUTH DAILY     Endocrinology:  Hypothyroid Agents Failed - 05/14/2022  6:37 AM      Failed - TSH in normal range and within 360 days    TSH  Date Value Ref Range Status  03/29/2022 5.79 (H) 0.40 - 4.50 mIU/L Final  Failed - Valid encounter within last 12 months    Recent Outpatient Visits           1 year ago Essential hypertension   [place], Cammie Mcgee, MD   1 year ago Hematuria, unspecified type   [place] Dennard Schaumann Cammie Mcgee, MD   1 year ago Paroxysmal atrial fibrillation [facility])   [place], Cammie Mcgee, MD   1 year ago Paroxysmal atrial fibrillation [facility])   [place] Pickard, Cammie Mcgee, MD   1 year ago Herpes zoster without complication   [place] Eulogio Bear, NP

## 2022-05-14 NOTE — Telephone Encounter (Signed)
Patient's daughter is following up, requesting to speak with Dr. Jacolyn Reedy nurse as soon as possible regarding having procedure rescheduled for 3/07 if at all possible. She states she called the number given to her by RN yesterday and she was informed by whoever answered the phone that they do not schedule this type of procedure.

## 2022-05-14 NOTE — Telephone Encounter (Signed)
Spoke with the patient's daughter who is calling to see if her father's TEE/DCCV can be moved to 3/7. I called central scheduling and there is no availability on 3/7. Advised patient's daughter who states that they will just keep it as scheduled on 3/6.

## 2022-05-14 NOTE — Telephone Encounter (Signed)
Requested medication (s) are due for refill today: yes  Requested medication (s) are on the active medication list: yes  Last refill:  05/13/21 #90 3 RF  Future visit scheduled: no  Notes to clinic:  last PSA 2017   Requested Prescriptions  Pending Prescriptions Disp Refills   tamsulosin (FLOMAX) 0.4 MG CAPS capsule [Pharmacy Med Name: TAMSULOSIN 0.'4MG'$  CAPSULES] 90 capsule     Sig: TAKE 1 CAPSULE(0.4 MG) BY MOUTH DAILY AFTER SUPPER     Urology: Alpha-Adrenergic Blocker Failed - 05/14/2022  6:37 AM      Failed - PSA in normal range and within 360 days    PSA  Date Value Ref Range Status  07/24/2015 5.02 (H) <=4.00 ng/mL Final    Comment:    Test Methodology: ECLIA PSA (Electrochemiluminescence Immunoassay)   For PSA values from 2.5-4.0, particularly in younger men <82 years old, the AUA and NCCN suggest testing for % Free PSA (3515) and evaluation of the rate of increase in PSA (PSA velocity).          Failed - Last BP in normal range    BP Readings from Last 1 Encounters:  05/10/22 (!) 172/70         Failed - Valid encounter within last 12 months    Recent Outpatient Visits           1 year ago Essential hypertension   [place], Cammie Mcgee, MD   1 year ago Hematuria, unspecified type   [place] [name], MD   1 year ago Paroxysmal atrial fibrillation [facility])   [place] Dennard Schaumann, Cammie Mcgee, MD   1 year ago Paroxysmal atrial fibrillation [facility])   [place] Dennard Schaumann, Cammie Mcgee, MD   1 year ago Herpes zoster without complication   [place] Eulogio Bear, NP              Signed Prescriptions Disp Refills   levothyroxine (SYNTHROID) 88 MCG tablet 90 tablet 3    Sig: TAKE 1 TABLET(88 MCG) BY MOUTH DAILY     Endocrinology:  Hypothyroid Agents Failed - 05/14/2022  6:37 AM      Failed - TSH in normal range and within 360 days    TSH  Date Value Ref Range  Status  03/29/2022 5.79 (H) 0.40 - 4.50 mIU/L Final         Failed - Valid encounter within last 12 months    Recent Outpatient Visits           1 year ago Essential hypertension   [place], Cammie Mcgee, MD   1 year ago Hematuria, unspecified type   [place] [name], MD   1 year ago Paroxysmal atrial fibrillation [facility])   [place] Dennard Schaumann, Cammie Mcgee, MD   1 year ago Paroxysmal atrial fibrillation [facility])   [place] Dennard Schaumann, Cammie Mcgee, MD   1 year ago Herpes zoster without complication   [facility] Eulogio Bear, NP              Refused Prescriptions Disp Refills   cloNIDine (CATAPRES) 0.2 MG tablet [Pharmacy Med Name: CLONIDINE 0.'2MG'$  TABLETS] 180 tablet     Sig: TAKE 1 TABLET(0.2 MG) BY MOUTH TWICE DAILY     Cardiovascular:  Alpha-2 Agonists Failed - 05/14/2022  6:37 AM      Failed -  Last BP in normal range    BP Readings from Last 1 Encounters:  05/10/22 (!) 172/70         Failed - Valid encounter within last 6 months    Recent Outpatient Visits           1 year ago Essential hypertension   [place], Cammie Mcgee, MD   1 year ago Hematuria, unspecified type   [place] [name], MD   1 year ago Paroxysmal atrial fibrillation [facility])   [place] [name], MD   1 year ago Paroxysmal atrial fibrillation [facility])   [place] [name], MD   1 year ago Herpes zoster without complication   [facility] Eulogio Bear, NP              Passed - Last Heart Rate in normal range    Pulse Readings from Last 1 Encounters:  05/10/22 89

## 2022-05-14 NOTE — Telephone Encounter (Signed)
Requested Prescriptions  Pending Prescriptions Disp Refills   levothyroxine (SYNTHROID) 88 MCG tablet [Pharmacy Med Name: LEVOTHYROXINE 0.'088MG'$  (88MCG) TAB] 90 tablet 3    Sig: TAKE 1 TABLET(88 MCG) BY MOUTH DAILY     Endocrinology:  Hypothyroid Agents Failed - 05/14/2022  6:37 AM      Failed - TSH in normal range and within 360 days    TSH  Date Value Ref Range Status  03/29/2022 5.79 (H) 0.40 - 4.50 mIU/L Final         Failed - Valid encounter within last 12 months    Recent Outpatient Visits           1 year ago Essential hypertension   [place], Cammie Mcgee, MD   1 year ago Hematuria, unspecified type   [place] [name], MD   1 year ago Paroxysmal atrial fibrillation [facility])   [place] [name], MD   1 year ago Paroxysmal atrial fibrillation [facility])   [place] [name], MD   1 year ago Herpes zoster without complication   [place] Eulogio Bear, NP               tamsulosin (FLOMAX) 0.4 MG CAPS capsule [Pharmacy Med Name: TAMSULOSIN 0.'4MG'$  CAPSULES] 90 capsule 3    Sig: TAKE 1 CAPSULE(0.4 MG) BY MOUTH DAILY AFTER SUPPER     Urology: Alpha-Adrenergic Blocker Failed - 05/14/2022  6:37 AM      Failed - PSA in normal range and within 360 days    PSA  Date Value Ref Range Status  07/24/2015 5.02 (H) <=4.00 ng/mL Final    Comment:    Test Methodology: ECLIA PSA (Electrochemiluminescence Immunoassay)   For PSA values from 2.5-4.0, particularly in younger men <75 years old, the [place] and NCCN suggest testing for % Free PSA (3515) and evaluation of the rate of increase in PSA (PSA velocity).          Failed - Last BP in normal range    BP Readings from Last 1 Encounters:  05/10/22 (!) 172/70         Failed - Valid encounter within last 12 months    Recent Outpatient Visits           1 year ago Essential hypertension   [place], Cammie Mcgee, MD   1 year ago Hematuria, unspecified type   [place] [name], MD   1 year ago Paroxysmal atrial fibrillation [facility])   [place] Dennard Schaumann, Cammie Mcgee, MD   1 year ago Paroxysmal atrial fibrillation [facility])   [place] Dennard Schaumann, Cammie Mcgee, MD   1 year ago Herpes zoster without complication   [place] Noemi Chapel A, NP               cloNIDine (CATAPRES) 0.2 MG tablet [Pharmacy Med Name: CLONIDINE 0.'2MG'$  TABLETS] 180 tablet 3    Sig: TAKE 1 TABLET(0.2 MG) BY MOUTH TWICE DAILY     Cardiovascular:  Alpha-2 Agonists Failed - 05/14/2022  6:37 AM      Failed - Last BP in normal range    BP Readings from Last 1 Encounters:  05/10/22 (!) 172/70         Failed - Valid encounter within last 6 months    Recent Outpatient Visits  1 year ago Essential hypertension   [place], Cammie Mcgee, MD   1 year ago Hematuria, unspecified type   [place] Dennard Schaumann Cammie Mcgee, MD   1 year ago Paroxysmal atrial fibrillation [facility])   [place] Dennard Schaumann, Cammie Mcgee, MD   1 year ago Paroxysmal atrial fibrillation [facility])   [place] [name], MD   1 year ago Herpes zoster without complication   [facility] Eulogio Bear, NP              Passed - Last Heart Rate in normal range    Pulse Readings from Last 1 Encounters:  05/10/22 89

## 2022-05-18 ENCOUNTER — Other Ambulatory Visit: Payer: Self-pay

## 2022-05-18 ENCOUNTER — Telehealth: Payer: Self-pay

## 2022-05-18 ENCOUNTER — Other Ambulatory Visit: Payer: Self-pay | Admitting: Family Medicine

## 2022-05-18 DIAGNOSIS — G8191 Hemiplegia, unspecified affecting right dominant side: Secondary | ICD-10-CM

## 2022-05-18 DIAGNOSIS — R319 Hematuria, unspecified: Secondary | ICD-10-CM

## 2022-05-18 DIAGNOSIS — I2510 Atherosclerotic heart disease of native coronary artery without angina pectoris: Secondary | ICD-10-CM

## 2022-05-18 MED ORDER — TAMSULOSIN HCL 0.4 MG PO CAPS: 0.4000 mg | ORAL_CAPSULE | Freq: Every day | ORAL | 3 refills | Status: DC

## 2022-05-18 NOTE — Telephone Encounter (Signed)
2nd request, already routed to the provider for approval on 05/14/22, still pending approval.

## 2022-05-18 NOTE — Telephone Encounter (Signed)
Prescription Request  05/18/2022  Is this a "Controlled Substance" medicine? No  LOV: 03/29/22  What is the name of the medication or equipment? tamsulosin (FLOMAX) 0.4 MG CAPS capsule [id number]  Have you contacted your pharmacy to request a refill? Yes   Which pharmacy would you like this sent to?  [facility] [phone] - [place], [place] AT [id number] [place] [address] Phone: [phone] Fax: [phone]    Patient notified that their request is being sent to the clinical staff for review and that they should receive a response within 2 business days.   Please advise at [facility] [phone]

## 2022-05-18 NOTE — Telephone Encounter (Signed)
2nd request for this medication refill, routed on 05/14/22 to office for provider approval.

## 2022-05-25 ENCOUNTER — Telehealth: Payer: Self-pay | Admitting: Cardiology

## 2022-05-25 NOTE — Telephone Encounter (Signed)
Called and spoke with pt's daughter. Went over instructions about TEE. No further questions at this time.

## 2022-05-25 NOTE — Telephone Encounter (Signed)
Spoke with patient's daughter. Patient is concerned that a pre-auth was not completed for his TEE and he will have a large bill. Daughter is calling to verify this. Explained that pre-auth is completed for such procedures. She also states they got a letter/notice that his portion of the procedure cost is $295. She will communicate this with her father. No further assistance needed.

## 2022-05-25 NOTE — Telephone Encounter (Signed)
Daughter wants a call back to discuss details of patient's upcoming procedure.

## 2022-05-25 NOTE — Telephone Encounter (Signed)
Pt's daughter is calling back to get further info on TEE scheduled tomorrow. Please advise.

## 2022-05-26 ENCOUNTER — Other Ambulatory Visit: Payer: Self-pay

## 2022-05-26 ENCOUNTER — Encounter (HOSPITAL_COMMUNITY): Payer: Self-pay | Admitting: Cardiology

## 2022-05-26 ENCOUNTER — Ambulatory Visit (HOSPITAL_BASED_OUTPATIENT_CLINIC_OR_DEPARTMENT_OTHER): Payer: Medicare Other

## 2022-05-26 ENCOUNTER — Encounter (HOSPITAL_COMMUNITY)
Admission: RE | Disposition: A | Discharge status: Home / Self Care | Payer: Self-pay | Source: Ambulatory Visit | Attending: Cardiology

## 2022-05-26 ENCOUNTER — Ambulatory Visit (HOSPITAL_COMMUNITY)
Admission: RE | Admit: 2022-05-26 | Discharge: 2022-05-26 | Disposition: A | Discharge status: Home / Self Care | Payer: Medicare Other | Source: Ambulatory Visit | Attending: Cardiology | Admitting: Cardiology

## 2022-05-26 ENCOUNTER — Ambulatory Visit (HOSPITAL_BASED_OUTPATIENT_CLINIC_OR_DEPARTMENT_OTHER): Payer: Medicare Other | Admitting: Anesthesiology

## 2022-05-26 ENCOUNTER — Ambulatory Visit (HOSPITAL_COMMUNITY): Payer: Medicare Other | Admitting: Anesthesiology

## 2022-05-26 DIAGNOSIS — I5032 Chronic diastolic (congestive) heart failure: Secondary | ICD-10-CM | POA: Insufficient documentation

## 2022-05-26 DIAGNOSIS — Z8673 Personal history of transient ischemic attack (TIA), and cerebral infarction without residual deficits: Secondary | ICD-10-CM | POA: Insufficient documentation

## 2022-05-26 DIAGNOSIS — Z87891 Personal history of nicotine dependence: Secondary | ICD-10-CM

## 2022-05-26 DIAGNOSIS — I110 Hypertensive heart disease with heart failure: Secondary | ICD-10-CM | POA: Insufficient documentation

## 2022-05-26 DIAGNOSIS — I4892 Unspecified atrial flutter: Secondary | ICD-10-CM

## 2022-05-26 DIAGNOSIS — I4819 Other persistent atrial fibrillation: Secondary | ICD-10-CM | POA: Diagnosis not present

## 2022-05-26 DIAGNOSIS — I252 Old myocardial infarction: Secondary | ICD-10-CM | POA: Diagnosis not present

## 2022-05-26 DIAGNOSIS — I739 Peripheral vascular disease, unspecified: Secondary | ICD-10-CM | POA: Diagnosis not present

## 2022-05-26 DIAGNOSIS — I340 Nonrheumatic mitral (valve) insufficiency: Secondary | ICD-10-CM

## 2022-05-26 DIAGNOSIS — I361 Nonrheumatic tricuspid (valve) insufficiency: Secondary | ICD-10-CM

## 2022-05-26 DIAGNOSIS — I2510 Atherosclerotic heart disease of native coronary artery without angina pectoris: Secondary | ICD-10-CM | POA: Insufficient documentation

## 2022-05-26 DIAGNOSIS — Z7901 Long term (current) use of anticoagulants: Secondary | ICD-10-CM | POA: Diagnosis not present

## 2022-05-26 DIAGNOSIS — J449 Chronic obstructive pulmonary disease, unspecified: Secondary | ICD-10-CM

## 2022-05-26 DIAGNOSIS — Z950 Presence of cardiac pacemaker: Secondary | ICD-10-CM | POA: Diagnosis not present

## 2022-05-26 DIAGNOSIS — I4891 Unspecified atrial fibrillation: Secondary | ICD-10-CM | POA: Diagnosis not present

## 2022-05-26 DIAGNOSIS — I509 Heart failure, unspecified: Secondary | ICD-10-CM

## 2022-05-26 HISTORY — PX: CARDIOVERSION: SHX1299

## 2022-05-26 HISTORY — PX: TEE WITHOUT CARDIOVERSION: SHX5443

## 2022-05-26 LAB — POCT I-STAT, CHEM 8
BUN: 25 mg/dL — ABNORMAL HIGH (ref 8–23)
Calcium, Ion: 1.12 mmol/L — ABNORMAL LOW (ref 1.15–1.40)
Chloride: 102 mmol/L (ref 98–111)
Creatinine, Ser: 1.3 mg/dL — ABNORMAL HIGH (ref 0.61–1.24)
Glucose, Bld: 103 mg/dL — ABNORMAL HIGH (ref 70–99)
HCT: 35 % — ABNORMAL LOW (ref 39.0–52.0)
Hemoglobin: 11.9 g/dL — ABNORMAL LOW (ref 13.0–17.0)
Potassium: 4.1 mmol/L (ref 3.5–5.1)
Sodium: 136 mmol/L (ref 135–145)
TCO2: 25 mmol/L (ref 22–32)

## 2022-05-26 LAB — ECHO TEE

## 2022-05-26 SURGERY — ECHOCARDIOGRAM, TRANSESOPHAGEAL
Anesthesia: General

## 2022-05-26 MED ORDER — SODIUM CHLORIDE 0.9 % IV SOLN: INTRAVENOUS | Status: DC

## 2022-05-26 MED ORDER — PROPOFOL 10 MG/ML IV BOLUS
INTRAVENOUS | Status: DC | PRN
  Administered 2022-05-26: 20 mg via INTRAVENOUS
  Administered 2022-05-26: 10 mg via INTRAVENOUS
  Administered 2022-05-26: 20 mg via INTRAVENOUS
  Administered 2022-05-26 (×2): 10 mg via INTRAVENOUS

## 2022-05-26 MED ORDER — PROPOFOL 500 MG/50ML IV EMUL
INTRAVENOUS | Status: DC | PRN
  Administered 2022-05-26: 75 ug/kg/min via INTRAVENOUS

## 2022-05-26 NOTE — Interval H&P Note (Signed)
History and Physical Interval Note:  05/26/2022 10:04 AM  [name]  has presented today for surgery, with the diagnosis of AFIB AFLUTTER.  The various methods of treatment have been discussed with the patient and family. After consideration of risks, benefits and other options for treatment, the patient has consented to  Procedure(s): TRANSESOPHAGEAL ECHOCARDIOGRAM (TEE) (N/A) CARDIOVERSION (N/A) as a surgical intervention.  The patient's history has been reviewed, patient examined, no change in status, stable for surgery.  I have reviewed the patient's chart and labs.  Questions were answered to the patient's satisfaction.     Freada Bergeron

## 2022-05-26 NOTE — Anesthesia Procedure Notes (Signed)
Procedure Name: MAC Date/Time: 05/26/2022 10:24 AM  Performed by: Carolan Clines, CRNAPre-anesthesia Checklist: Patient identified, Emergency Drugs available, Suction available and Patient being monitored Patient Re-evaluated:Patient Re-evaluated prior to induction Oxygen Delivery Method: Nasal cannula Dental Injury: Teeth and Oropharynx as per pre-operative assessment

## 2022-05-26 NOTE — CV Procedure (Addendum)
Procedure: Electrical Cardioversion Indications:  Atrial Fibrillation  Procedure Details:  Consent: Risks of procedure as well as the alternatives and risks of each were explained to the (patient/caregiver).  Consent for procedure obtained.  Time Out: Verified patient identification, verified procedure, site/side was marked, verified correct patient position, special equipment/implants available, medications/allergies/relevent history reviewed, required imaging and test results available. PERFORMED.  Patient placed on cardiac monitor, pulse oximetry, supplemental oxygen as necessary.  Sedation given:  Propofol '200mg'$  Pacer pads placed anterior and posterior chest.  Cardioverted 2 time(s).  Cardioversion with synchronized biphasic 150J, 200J shock.  Evaluation: Findings: Post procedure EKG shows:  A-sensed, v-paced  Complications: None Patient did tolerate procedure well.  Time Spent Directly with the Patient:  19mnutes   HFreada Bergeron3/08/2022, 10:59 AM

## 2022-05-26 NOTE — Anesthesia Postprocedure Evaluation (Signed)
Anesthesia Post Note  Patient: [name]  Procedure(s) Performed: TRANSESOPHAGEAL ECHOCARDIOGRAM (TEE) CARDIOVERSION     Patient location during evaluation: PACU Anesthesia Type: General Level of consciousness: awake and alert Pain management: pain level controlled Vital Signs Assessment: post-procedure vital signs reviewed and stable Respiratory status: spontaneous breathing, nonlabored ventilation, respiratory function stable and patient connected to nasal cannula oxygen Cardiovascular status: blood pressure returned to baseline and stable Postop Assessment: no apparent nausea or vomiting Anesthetic complications: no  No notable events documented.  Last Vitals:  Vitals:   05/26/22 1110 05/26/22 1120  BP: 116/68 127/66  Pulse: 63 63  Resp: 17 14  Temp:    SpO2: 95% 93%    Last Pain:  Vitals:   05/26/22 1120  TempSrc:   PainSc: 0-No pain                 Effie Berkshire

## 2022-05-26 NOTE — Discharge Instructions (Signed)
TEE  YOU HAD AN CARDIAC PROCEDURE TODAY: Refer to the procedure report and other information in the discharge instructions given to you for any specific questions about what was found during the examination. If this information does not answer your questions, please call [place] office at [phone] to clarify.   DIET: Your first meal following the procedure should be a light meal and then it is ok to progress to your normal diet. A half-sandwich or bowl of soup is an example of a good first meal. Heavy or fried foods are harder to digest and may make you feel nauseous or bloated. Drink plenty of fluids but you should avoid alcoholic beverages for 24 hours. If you had a esophageal dilation, please see attached instructions for diet.   ACTIVITY: Your care partner should take you home directly after the procedure. You should plan to take it easy, moving slowly for the rest of the day. You can resume normal activity the day after the procedure however YOU SHOULD NOT DRIVE, use power tools, machinery or perform tasks that involve climbing or major physical exertion for 24 hours (because of the sedation medicines used during the test).   SYMPTOMS TO REPORT IMMEDIATELY: A cardiologist can be reached at any hour. Please call [phone] for any of the following symptoms:  Vomiting of blood or coffee ground material  New, significant abdominal pain  New, significant chest pain or pain under the shoulder blades  Painful or persistently difficult swallowing  New shortness of breath  Black, tarry-looking or red, bloody stools  FOLLOW UP:  Please also call with any specific questions about appointments or follow up tests.   Electrical Cardioversion Electrical cardioversion is the delivery of a jolt of electricity to restore a normal rhythm to the heart. A rhythm that is too fast or is not regular keeps the heart from pumping well. In this procedure, sticky patches or metal paddles are placed on  the chest to deliver electricity to the heart from a device. This procedure may be done in an emergency if: There is low or no blood pressure as a result of the heart rhythm. Normal rhythm must be restored as fast as possible to protect the brain and heart from further damage. It may save a life. This may also be a scheduled procedure for irregular or fast heart rhythms that are not immediately life-threatening.  What can I expect after the procedure? Your blood pressure, heart rate, breathing rate, and blood oxygen level will be monitored until you leave the hospital or clinic. Your heart rhythm will be watched to make sure it does not change. You may have some redness on the skin where the shocks were given. Over the counter cortizone cream may be helpful.  Follow these instructions at home: Do not drive for 24 hours if you were given a sedative during your procedure. Take over-the-counter and prescription medicines only as told by your health care provider. Ask your health care provider how to check your pulse. Check it often. Rest for 48 hours after the procedure or as told by your health care provider. Avoid or limit your caffeine use as told by your health care provider. Keep all follow-up visits as told by your health care provider. This is important. Contact a health care provider if: You feel like your heart is beating too quickly or your pulse is not regular. You have a serious muscle cramp that does not go away. Get help right away if: You have discomfort  in your chest. You are dizzy or you feel faint. You have trouble breathing or you are short of breath. Your speech is slurred. You have trouble moving an arm or leg on one side of your body. Your fingers or toes turn cold or blue. Summary Electrical cardioversion is the delivery of a jolt of electricity to restore a normal rhythm to the heart. This procedure may be done right away in an emergency or may be a scheduled  procedure if the condition is not an emergency. Generally, this is a safe procedure. After the procedure, check your pulse often as told by your health care provider. This information is not intended to replace advice given to you by your health care provider. Make sure you discuss any questions you have with your health care provider. Document Revised: 10/09/2018 Document Reviewed: 10/09/2018 Elsevier Patient Education  [place].

## 2022-05-26 NOTE — Transfer of Care (Signed)
Immediate Anesthesia Transfer of Care Note  Patient: [name]  Procedure(s) Performed: TRANSESOPHAGEAL ECHOCARDIOGRAM (TEE) CARDIOVERSION  Patient Location: Endoscopy Unit  Anesthesia Type:MAC  Level of Consciousness: awake, alert , and oriented  Airway & Oxygen Therapy: Patient Spontanous Breathing  Post-op Assessment: Report given to RN and Post -op Vital signs reviewed and stable  Post vital signs: Reviewed and stable  Last Vitals: see endo postop VS flowsheet Vitals Value Taken Time  BP    Temp    Pulse    Resp    SpO2      Last Pain:  Vitals:   05/26/22 0830  TempSrc: Temporal  PainSc: 0-No pain         Complications: No notable events documented.

## 2022-05-26 NOTE — Anesthesia Preprocedure Evaluation (Addendum)
Anesthesia Evaluation  Patient identified by MRN, date of birth, ID band Patient awake    Reviewed: Allergy & Precautions, Patient's Chart, lab work & pertinent test results  Airway Mallampati: I  TM Distance: >3 FB Neck ROM: Full    Dental  (+) Dental Advisory Given, Poor Dentition, Missing,    Pulmonary COPD, former smoker   breath sounds clear to auscultation       Cardiovascular hypertension, Pt. on medications + CAD, + Past MI, + Cardiac Stents, + Peripheral Vascular Disease and +CHF  + dysrhythmias Atrial Fibrillation + pacemaker  Rhythm:Irregular Rate:Bradycardia  Echo (2022):  1. Left ventricular ejection fraction, by estimation, is 50 to 55%. The  left ventricle has low normal function. The left ventricle has no regional  wall motion abnormalities. There is mild left ventricular hypertrophy.  Left ventricular diastolic  parameters are consistent with Grade I diastolic dysfunction (impaired  relaxation). Elevated left atrial pressure.   2. Right ventricular systolic function is normal. The right ventricular  size is normal. There is mildly elevated pulmonary artery systolic  pressure.   3. Left atrial size was severely dilated.   4. The mitral valve is normal in structure. Trivial mitral valve  regurgitation. No evidence of mitral stenosis. Moderate mitral annular  calcification.   5. The aortic valve is tricuspid. There is mild calcification of the  aortic valve. There is moderate thickening of the aortic valve. Aortic  valve regurgitation is not visualized. Mild to moderate aortic valve  sclerosis/calcification is present, without  any evidence of aortic stenosis.   6. There is mild dilatation of the ascending aorta, measuring 39 mm.     Neuro/Psych Seizures -,  DDD CVA    GI/Hepatic   Endo/Other  diabetesHypothyroidism    Renal/GU      Musculoskeletal  (+) Arthritis ,    Abdominal   Peds   Hematology xarelto   Anesthesia Other Findings   Reproductive/Obstetrics                             Anesthesia Physical Anesthesia Plan  ASA: 3  Anesthesia Plan: General   Post-op Pain Management: Minimal or no pain anticipated   Induction:   PONV Risk Score and Plan: 1 and Propofol infusion  Airway Management Planned: Natural Airway, Nasal Cannula and Mask  Additional Equipment: None  Intra-op Plan:   Post-operative Plan:   Informed Consent: I have reviewed the patients History and Physical, chart, labs and discussed the procedure including the risks, benefits and alternatives for the proposed anesthesia with the patient or authorized representative who has indicated his/her understanding and acceptance.       Plan Discussed with: CRNA  Anesthesia Plan Comments:         Anesthesia Quick Evaluation

## 2022-05-26 NOTE — CV Procedure (Signed)
     Transesophageal Echocardiogram Note  SRIHARI HEDGES [id number] [birth year]  Procedure: Transesophageal Echocardiogram Indications: Afib  Procedure Details Consent: Obtained Time Out: Verified patient identification, verified procedure, site/side was marked, verified correct patient position, special equipment/implants available, Radiology Safety Procedures followed,  medications/allergies/relevent history reviewed, required imaging and test results available.  Performed  Medications: Propofol: '200mg'$   Left Ventrical:  LVEF 50-55%  Mitral Valve: Normal structure, trivial MR  Aortic Valve: Tricuspid, no AR  Tricuspid Valve: Normal structure, mild TR  Pulmonic Valve: Normal structure, no PR  Left Atrium/ Left atrial appendage: No evidence of LAA thrombus  Atrial septum: Grossly normal  Aorta: Moderate plaquing   Complications: No apparent complications Patient did tolerate procedure well.  Freada Bergeron, MD 05/26/2022, 10:56 AM

## 2022-05-30 ENCOUNTER — Encounter (HOSPITAL_COMMUNITY): Payer: Self-pay

## 2022-05-30 ENCOUNTER — Emergency Department (HOSPITAL_COMMUNITY): Payer: Medicare Other

## 2022-05-30 ENCOUNTER — Emergency Department (HOSPITAL_COMMUNITY)
Admission: EM | Admit: 2022-05-30 | Discharge: 2022-05-30 | Disposition: A | Discharge status: Home / Self Care | Payer: Medicare Other | Attending: Emergency Medicine | Admitting: Emergency Medicine

## 2022-05-30 ENCOUNTER — Other Ambulatory Visit: Payer: Self-pay

## 2022-05-30 DIAGNOSIS — I6782 Cerebral ischemia: Secondary | ICD-10-CM | POA: Insufficient documentation

## 2022-05-30 DIAGNOSIS — Z8673 Personal history of transient ischemic attack (TIA), and cerebral infarction without residual deficits: Secondary | ICD-10-CM | POA: Diagnosis not present

## 2022-05-30 DIAGNOSIS — Z7901 Long term (current) use of anticoagulants: Secondary | ICD-10-CM | POA: Diagnosis not present

## 2022-05-30 DIAGNOSIS — Y92009 Unspecified place in unspecified non-institutional (private) residence as the place of occurrence of the external cause: Secondary | ICD-10-CM | POA: Diagnosis not present

## 2022-05-30 DIAGNOSIS — M1711 Unilateral primary osteoarthritis, right knee: Secondary | ICD-10-CM | POA: Insufficient documentation

## 2022-05-30 DIAGNOSIS — M1710 Unilateral primary osteoarthritis, unspecified knee: Secondary | ICD-10-CM

## 2022-05-30 DIAGNOSIS — I8393 Asymptomatic varicose veins of bilateral lower extremities: Secondary | ICD-10-CM | POA: Insufficient documentation

## 2022-05-30 DIAGNOSIS — T1490XA Injury, unspecified, initial encounter: Secondary | ICD-10-CM | POA: Diagnosis not present

## 2022-05-30 DIAGNOSIS — Z043 Encounter for examination and observation following other accident: Secondary | ICD-10-CM | POA: Diagnosis not present

## 2022-05-30 DIAGNOSIS — R6889 Other general symptoms and signs: Secondary | ICD-10-CM | POA: Diagnosis not present

## 2022-05-30 DIAGNOSIS — W19XXXA Unspecified fall, initial encounter: Secondary | ICD-10-CM | POA: Diagnosis not present

## 2022-05-30 DIAGNOSIS — Z79899 Other long term (current) drug therapy: Secondary | ICD-10-CM | POA: Insufficient documentation

## 2022-05-30 DIAGNOSIS — M25461 Effusion, right knee: Secondary | ICD-10-CM | POA: Diagnosis not present

## 2022-05-30 DIAGNOSIS — I2510 Atherosclerotic heart disease of native coronary artery without angina pectoris: Secondary | ICD-10-CM | POA: Insufficient documentation

## 2022-05-30 DIAGNOSIS — M79604 Pain in right leg: Secondary | ICD-10-CM | POA: Diagnosis not present

## 2022-05-30 DIAGNOSIS — I10 Essential (primary) hypertension: Secondary | ICD-10-CM | POA: Insufficient documentation

## 2022-05-30 DIAGNOSIS — E119 Type 2 diabetes mellitus without complications: Secondary | ICD-10-CM | POA: Diagnosis not present

## 2022-05-30 LAB — CBC
HCT: 36.7 % — ABNORMAL LOW (ref 39.0–52.0)
Hemoglobin: 11.9 g/dL — ABNORMAL LOW (ref 13.0–17.0)
MCH: 31 pg (ref 26.0–34.0)
MCHC: 32.4 g/dL (ref 30.0–36.0)
MCV: 95.6 fL (ref 80.0–100.0)
Platelets: 214 10*3/uL (ref 150–400)
RBC: 3.84 MIL/uL — ABNORMAL LOW (ref 4.22–5.81)
RDW: 15.8 % — ABNORMAL HIGH (ref 11.5–15.5)
WBC: 8.7 10*3/uL (ref 4.0–10.5)
nRBC: 0 % (ref 0.0–0.2)

## 2022-05-30 LAB — BASIC METABOLIC PANEL
Anion gap: 5 (ref 5–15)
BUN: 20 mg/dL (ref 8–23)
CO2: 25 mmol/L (ref 22–32)
Calcium: 8.5 mg/dL — ABNORMAL LOW (ref 8.9–10.3)
Chloride: 105 mmol/L (ref 98–111)
Creatinine, Ser: 0.86 mg/dL (ref 0.61–1.24)
GFR, Estimated: >60 mL/min (ref >60)
Glucose, Bld: 124 mg/dL — ABNORMAL HIGH (ref 70–99)
Potassium: 3.9 mmol/L (ref 3.5–5.1)
Sodium: 135 mmol/L (ref 135–145)

## 2022-05-30 MED ORDER — HYDROCODONE-ACETAMINOPHEN 5-325 MG PO TABS
1.0000 | ORAL_TABLET | Freq: Once | ORAL | Status: AC
  Administered 2022-05-30: 1 via ORAL
  Filled 2022-05-30: qty 1

## 2022-05-30 MED ORDER — HYDROCODONE-ACETAMINOPHEN 5-325 MG PO TABS: 1.0000 | ORAL_TABLET | Freq: Four times a day (QID) | ORAL | 0 refills | Status: DC | PRN

## 2022-05-30 MED ORDER — CLONIDINE HCL 0.1 MG PO TABS
0.2000 mg | ORAL_TABLET | Freq: Once | ORAL | Status: AC
  Administered 2022-05-30: 0.2 mg via ORAL
  Filled 2022-05-30: qty 2

## 2022-05-30 NOTE — ED Triage Notes (Signed)
Per EMS, pt is coming from home following a fall last night. Denies LOC, head/neck/back pain. Endorses pain in right hip/leg. Pt able to bear weight, but does cause pain. Pt currently taking Xarelto.

## 2022-05-30 NOTE — ED Provider Notes (Signed)
[place] EMERGENCY DEPARTMENT AT [facility] Provider Note   CSN: [id number] Arrival date & time: 05/30/22  1607     History  Chief Complaint  Patient presents with   Fall        Hip Pain   Leg Injury    [name] is a 89 y.o. male.   Fall  Hip Pain  Patient has a history of dyslipidemia, seizures, degenerative disc disease, hypertension, syncope, BPH, diabetes, coronary artery disease, stroke who presents to the ED with complaints of a fall.  Patient states he was at home yesterday and had a mechanical fall.  He denies any loss of consciousness.  No headache.  No chest pain or shortness of breath.  No abdominal pain.  No back pain.  Patient states since the fall he has been having pain in his right leg.  It goes down towards his knee.  He is able to walk but it is painful.  He decided to come in today for evaluation because it was not getting any better.  Patient does take Xarelto     Home Medications Prior to Admission medications   Medication Sig Start Date End Date Taking? Authorizing Provider  HYDROcodone-acetaminophen (NORCO/VICODIN) 5-325 MG tablet Take 1 tablet by mouth every 6 (six) hours as needed. 05/30/22  Yes Dorie Rank, MD  acetaminophen (TYLENOL) 325 MG tablet Take 1-2 tablets (325-650 mg total) by mouth every 4 (four) hours as needed for mild pain. 06/15/18   Love, Ivan Anchors, PA-C  amLODipine (NORVASC) 10 MG tablet Take 1 tablet (10 mg total) by mouth daily. 10/22/21   [name], MD  atorvastatin (LIPITOR) 80 MG tablet TAKE 1 TABLET BY MOUTH EVERY DAY AT 6 PM 01/29/22   [name], MD  carbamazepine (CARBATROL) 300 MG 12 hr capsule TAKE 1 CAPSULE(300 MG) BY MOUTH TWICE DAILY Patient taking differently: Take 300 mg by mouth 2 (two) times daily. 12/30/21   [name], MD  cloNIDine (CATAPRES) 0.2 MG tablet TAKE 1 TABLET(0.2 MG) BY MOUTH TWICE DAILY Patient taking differently: Take 0.2 mg by mouth daily. 06/24/21   [name], MD  donepezil (ARICEPT) 5 MG tablet TAKE 1 TABLET(5 MG) BY MOUTH AT BEDTIME 05/12/22   [name], MD  escitalopram (LEXAPRO) 5 MG tablet TAKE 1 TABLET BY MOUTH EVERY DAY AT BEDTIME 03/16/22   [name], MD  furosemide (LASIX) 20 MG tablet TAKE 1/2 TABLET(10 MG) BY MOUTH DAILY 05/12/22   [name], MD  glucose blood test strip 1 each by Other route as needed. Use as instructed 08/25/18   Alycia Rossetti, MD  glucose monitoring kit (FREESTYLE) monitoring kit 1 each by Does not apply route as needed. 08/25/18   Alycia Rossetti, MD  isosorbide mononitrate (IMDUR) 60 MG 24 hr tablet TAKE 1/2 TABLET BY MOUTH EVERY DAY 10/13/21   [name], MD  Lancets (ACCU-CHEK SAFE-T PRO) lancets 1 each by Other route as needed. Use as instructed 08/25/18   Alycia Rossetti, MD  levETIRAcetam (KEPPRA) 500 MG tablet Take 1 tablet (500 mg total) by mouth 2 (two) times daily. 01/01/22   [name], MD  levothyroxine (SYNTHROID) 88 MCG tablet TAKE 1 TABLET(88 MCG) BY MOUTH DAILY 05/14/22   [name], MD  losartan (COZAAR) 100 MG tablet Take 1 tablet (100 mg total) by mouth daily. 05/10/22   Mamie Levers, NP  Multiple Vitamins-Minerals (MULTIVITAMINS THER. W/MINERALS) TABS Take  1 tablet by mouth at bedtime.      [provider]  MYRBETRIQ 50 MG TB24 tablet Take 50 mg by mouth daily. 08/07/20   [provider]  rivaroxaban (XARELTO) 20 MG TABS tablet Take 1 tablet (20 mg total) by mouth daily with supper. 09/18/21   Minus Breeding, MD  tamsulosin (FLOMAX) 0.4 MG CAPS capsule Take 1 capsule (0.4 mg total) by mouth daily after supper. TAKE 1 CAPSULE(0.4 MG) BY MOUTH DAILY AFTER AND SUPPER Strength: 0.4 mg 05/18/22   [name], MD      Allergies    Penicillins    Review of Systems   Review of Systems  Physical Exam Updated Vital Signs BP (!) 163/79 (BP Location: Right Arm)   Pulse (!) 59   Temp 98 F (36.7 C) (Oral)   Resp 17   Ht 1.715 m  (5' 7.5")   Wt 78 kg   SpO2 97%   BMI 26.54 kg/m  Physical Exam Vitals and nursing note reviewed.  Constitutional:      General: He is not in acute distress.    Appearance: He is well-developed.  HENT:     Head: Normocephalic and atraumatic.     Right Ear: External ear normal.     Left Ear: External ear normal.  Eyes:     General: No scleral icterus.       Right eye: No discharge.        Left eye: No discharge.     Conjunctiva/sclera: Conjunctivae normal.  Neck:     Trachea: No tracheal deviation.  Cardiovascular:     Rate and Rhythm: Normal rate and regular rhythm.  Pulmonary:     Effort: Pulmonary effort is normal. No respiratory distress.     Breath sounds: Normal breath sounds. No stridor. No wheezing or rales.  Abdominal:     General: Bowel sounds are normal. There is no distension.     Palpations: Abdomen is soft.     Tenderness: There is no abdominal tenderness. There is no guarding or rebound.  Musculoskeletal:        General: Tenderness present. No deformity.     Cervical back: Normal and neck supple. No tenderness.     Thoracic back: Normal. No tenderness.     Lumbar back: Normal. No tenderness.     Comments: , Varicose veins noted bilateral lower extremities, bruising suspected right thigh, pain and with range of motion right hip and also tenderness palpation right knee, no deformity, no pain cervical thoracic or lumbar spine  Skin:    General: Skin is warm and dry.     Findings: No rash.  Neurological:     General: No focal deficit present.     Mental Status: He is alert.     Cranial Nerves: No cranial nerve deficit, dysarthria or facial asymmetry.     Sensory: No sensory deficit.     Motor: No abnormal muscle tone or seizure activity.     Coordination: Coordination normal.  Psychiatric:        Mood and Affect: Mood normal.     ED Results / Procedures / Treatments   Labs (all labs ordered are listed, but only abnormal results are displayed) Labs  Reviewed  CBC - Abnormal; Notable for the following components:      Result Value   RBC 3.84 (*)    Hemoglobin 11.9 (*)    HCT 36.7 (*)    RDW 15.8 (*)    All  other components within normal limits  BASIC METABOLIC PANEL - Abnormal; Notable for the following components:   Glucose, Bld 124 (*)    Calcium 8.5 (*)    All other components within normal limits    EKG None  Radiology CT Head Wo Contrast  Result Date: 05/30/2022 CLINICAL DATA:  Patient with trauma. EXAM: CT HEAD WITHOUT CONTRAST CT CERVICAL SPINE WITHOUT CONTRAST TECHNIQUE: Multidetector CT imaging of the head and cervical spine was performed following the standard protocol without intravenous contrast. Multiplanar CT image reconstructions of the cervical spine were also generated. RADIATION DOSE REDUCTION: This exam was performed according to the departmental dose-optimization program which includes automated exposure control, adjustment of the mA and/or kV according to patient size and/or use of iterative reconstruction technique. COMPARISON:  Brain CT 01/05/2022. FINDINGS: CT HEAD FINDINGS Brain: Ventricles and sulci are prominent compatible with atrophy. Periventricular and subcortical white matter hypodensities compatible with chronic microvascular ischemic changes. No evidence for acute cortically based infarct, intracranial hemorrhage, mass lesion or mass effect. Vascular: No hyperdense vessel or unexpected calcification. Skull: Intact. Sinuses/Orbits: Probable postsurgical changes right mastoid air cells. Paranasal sinuses well aerated. Left mastoid air cells well-aerated. Other: None. CT CERVICAL SPINE FINDINGS Alignment: Normal anatomic alignment Skull base and vertebrae: No acute fracture. No primary bone lesion or focal pathologic process. Soft tissues and spinal canal: No prevertebral fluid or swelling. No visible canal hematoma. Disc levels: No acute fracture. Multilevel degenerative disc disease throughout the cervical  spine. Upper chest: Negative. Other: None IMPRESSION: 1. No acute intracranial process. Atrophy and chronic microvascular ischemic changes. 2. No acute cervical spine fracture. Multilevel degenerative disc disease. Electronically Signed   By: Lovey Newcomer M.D.   On: 05/30/2022 18:43   CT Cervical Spine Wo Contrast  Result Date: 05/30/2022 CLINICAL DATA:  Patient with trauma. EXAM: CT HEAD WITHOUT CONTRAST CT CERVICAL SPINE WITHOUT CONTRAST TECHNIQUE: Multidetector CT imaging of the head and cervical spine was performed following the standard protocol without intravenous contrast. Multiplanar CT image reconstructions of the cervical spine were also generated. RADIATION DOSE REDUCTION: This exam was performed according to the departmental dose-optimization program which includes automated exposure control, adjustment of the mA and/or kV according to patient size and/or use of iterative reconstruction technique. COMPARISON:  Brain CT 01/05/2022. FINDINGS: CT HEAD FINDINGS Brain: Ventricles and sulci are prominent compatible with atrophy. Periventricular and subcortical white matter hypodensities compatible with chronic microvascular ischemic changes. No evidence for acute cortically based infarct, intracranial hemorrhage, mass lesion or mass effect. Vascular: No hyperdense vessel or unexpected calcification. Skull: Intact. Sinuses/Orbits: Probable postsurgical changes right mastoid air cells. Paranasal sinuses well aerated. Left mastoid air cells well-aerated. Other: None. CT CERVICAL SPINE FINDINGS Alignment: Normal anatomic alignment Skull base and vertebrae: No acute fracture. No primary bone lesion or focal pathologic process. Soft tissues and spinal canal: No prevertebral fluid or swelling. No visible canal hematoma. Disc levels: No acute fracture. Multilevel degenerative disc disease throughout the cervical spine. Upper chest: Negative. Other: None IMPRESSION: 1. No acute intracranial process. Atrophy and  chronic microvascular ischemic changes. 2. No acute cervical spine fracture. Multilevel degenerative disc disease. Electronically Signed   By: Lovey Newcomer M.D.   On: 05/30/2022 18:43   DG Knee Complete 4 Views Right  Result Date: 05/30/2022 CLINICAL DATA:  Fall with pain EXAM: RIGHT KNEE - COMPLETE 4 VIEW COMPARISON:  Right knee radiographs dated 09/19/2019 FINDINGS: There are no findings of fracture or dislocation. Large joint effusion. Severe tricompartmental degenerative changes of  the knee, progressed compared to 09/19/2019. Soft tissues are unremarkable. IMPRESSION: 1. No acute fracture or dislocation. 2. Large joint effusion. 3. Severe tricompartmental degenerative changes of the knee, progressed compared to 09/19/2019. Electronically Signed   By: Darrin Nipper M.D.   On: 05/30/2022 17:12   DG Hip Unilat W or Wo Pelvis 2-3 Views Right  Result Date: 05/30/2022 CLINICAL DATA:  Fall EXAM: DG HIP (WITH OR WITHOUT PELVIS) 2-3V RIGHT COMPARISON:  None Available. FINDINGS: There is no evidence of hip fracture or dislocation. There is no evidence of arthropathy or other focal bone abnormality. IMPRESSION: Negative. Electronically Signed   By: Ronney Asters M.D.   On: 05/30/2022 17:11    Procedures Procedures    Medications Ordered in ED Medications  HYDROcodone-acetaminophen (NORCO/VICODIN) 5-325 MG per tablet 1 tablet (has no administration in time range)  cloNIDine (CATAPRES) tablet 0.2 mg (0.2 mg Oral Given 05/30/22 1639)    ED Course/ Medical Decision Making/ A&P Clinical Course as of 05/30/22 1931  Sun May 30, 2022  1635 Patient's blood pressure noted to be elevated.  I will give a dose of his home clonidine [JK]  [id number] Basic metabolic panel(!) Metabolic panel unremarkable.  CBC shows hemoglobin 11.9, similar to previous values [JK]  1750 Knee x-ray shows no acute fracture.  Large joint effusion noted, severe arthritis [JK]  1750 Hip x-ray without acute findings [JK]  1902 Head CT and  C-spine CT without acute abnormalities [JK]    Clinical Course User Index [JK] Dorie Rank, MD                             Medical Decision Making Problems Addressed: Arthritis of knee: chronic illness or injury with exacerbation, progression, or side effects of treatment Knee effusion, right: acute illness or injury that poses a threat to life or bodily functions  Amount and/or Complexity of Data Reviewed Labs: ordered. Decision-making details documented in ED Course. Radiology: ordered and independent interpretation performed.  Risk Prescription drug management.   Patient presented to the ED for evaluation of persistent pain after a fall yesterday.  Patient primarily having pain in the right knee.  Knee noticeably larger in size compared to the left.  Patient had some mild tenderness in his [place] primarily in his right knee.  X-rays do not show any signs of pelvic or hip fracture.  X-rays do show signs of severe osteoarthritis.  No evidence of fracture.  Patient also has a joint effusion.  Patient states he has spoken orthopedics in the past surgery not recommended because of his age and comorbidities.  Patient does have a walker and is able to bear weight although it is painful.  He has been taking Tylenol.  I will write a prescription for hydrocodone to take for more severe pain.  Doubt occult hip fracture.  No spinal tenderness to suggest spinal fracture.  Evaluation and diagnostic testing in the emergency department does not suggest an emergent condition requiring admission or immediate intervention beyond what has been performed at this time.  The patient is safe for discharge and has been instructed to return immediately for worsening symptoms, change in symptoms or any other concerns.        Final Clinical Impression(s) / ED Diagnoses Final diagnoses:  Arthritis of knee  Knee effusion, right    Rx / DC Orders ED Discharge Orders          Ordered  HYDROcodone-acetaminophen (NORCO/VICODIN) 5-325 MG tablet  Every 6 hours PRN        05/30/22 1927              Dorie Rank, MD 05/30/22 1931

## 2022-05-30 NOTE — Discharge Instructions (Signed)
Take the medications as prescribed to help with your pain.  You can take the hydrocodone for more severe pain.  Make sure to watch her total amount of Tylenol, acetaminophen per day.  Do not exceed 4 g daily.  Follow-up with your orthopedic doctor for further treatment if the symptoms persist.

## 2022-06-01 ENCOUNTER — Ambulatory Visit: Payer: Self-pay

## 2022-06-01 NOTE — Telephone Encounter (Signed)
Spoke w/pt this morning,per pt is icing leg now. Can't stand on leg due to pain. Tried to schedule appt per pt can't do it right now. Per pt wants a few days until pt can put some wt on his leg. Per pt will call to make appt when he can put a little bit of wt on leg.

## 2022-06-01 NOTE — Telephone Encounter (Signed)
  Chief Complaint: Unable to use right leg d/t knee pain Symptoms: pain 10/10 Frequency: Saturday Pertinent Negatives: Patient denies  Disposition: [] ED /[] Urgent Care (no appt availability in office) / [] Appointment(In office/virtual)/ []  [facility]/ [] Home Care/ [x] Refused Recommended Disposition /[] [place] [facility]/ []  Follow-up with PCP Additional Notes: Spoke with pt's daughter, Maudie Mercury. Maudie Mercury states that pt fell on Saturday and was taken to ED on Sunday.  Pt had complaint of knee pain. Since then knee pain has increased. PT is unable to use right leg at all d/t pain. PT is wearing briefs as he cannot get to the bathroom. PT is in a wheelchair.  Kim states that pt cries out in pain when placing any weight on knee.  Kim refused ED. She is unsure if pt could even come for an OV. Kim would like suggestions on where she should take pt for care. Please advise.    Reason for Disposition  [1] SEVERE pain (e.g., excruciating, unable to walk) AND [2] not improved after 2 hours of pain medicine  Answer Assessment - Initial Assessment Questions 1. LOCATION and RADIATION: "Where is the pain located?"      Right knee 2. QUALITY: "What does the pain feel like?"  (e.g., sharp, dull, aching, burning)      3. SEVERITY: "How bad is the pain?" "What does it keep you from doing?"   (Scale 1-10; or mild, moderate, severe)   -  MILD (1-3): doesn\'t interfere with normal activities    -  MODERATE (4-7): interferes with normal activities (e.g., work or school) or awakens from sleep, limping    -  SEVERE (8-10): excruciating pain, unable to do any normal activities, unable to walk     10 /10 4. ONSET: "When did the pain start?" "Does it come and go, or is it there all the time?"     Ongoing - much worse since fall 5. RECURRENT: "Have you had this pain before?" If Yes, ask: "When, and what happened then?"     yes 6. SETTING: "Has there been any recent work, exercise or other activity that  involved that part of the body?"      Fell 7. AGGRAVATING FACTORS: "What makes the knee pain worse?" (e.g., walking, climbing stairs, running)     [facility] Saturday night 8. ASSOCIATED SYMPTOMS: "Is there any swelling or redness of the knee?"     swelling 9. OTHER SYMPTOMS: "Do you have any other symptoms?" (e.g., chest pain, difficulty breathing, fever, calf pain)     Cannot walk on that knee  Protocols used: Knee Pain-A-AH

## 2022-06-02 ENCOUNTER — Telehealth: Payer: Self-pay

## 2022-06-02 NOTE — Telephone Encounter (Signed)
Pt's daughter called. Pt fell Saturday. Was seen in ER because pt could not ambulate. X-ray and CT were normal and pt was sent home. Pt is still having difficulty ambulating. Maudie Mercury asks if pt can have an order for Home Health. I advised Maudie Mercury that pt may need to be seen in office to set up [facility]. I made them an appointment for Thursday at 12:30 pm.  Does he need to be seen to set up [facility] referral or would the ER visit be sufficient? Thanks, MJ

## 2022-06-03 ENCOUNTER — Telehealth (INDEPENDENT_AMBULATORY_CARE_PROVIDER_SITE_OTHER): Payer: Medicare Other | Admitting: Family Medicine

## 2022-06-03 DIAGNOSIS — W19XXXA Unspecified fall, initial encounter: Secondary | ICD-10-CM | POA: Diagnosis not present

## 2022-06-03 DIAGNOSIS — R4181 Age-related cognitive decline: Secondary | ICD-10-CM | POA: Diagnosis not present

## 2022-06-03 DIAGNOSIS — M25561 Pain in right knee: Secondary | ICD-10-CM | POA: Diagnosis not present

## 2022-06-03 DIAGNOSIS — R6889 Other general symptoms and signs: Secondary | ICD-10-CM

## 2022-06-03 DIAGNOSIS — R29898 Other symptoms and signs involving the musculoskeletal system: Secondary | ICD-10-CM

## 2022-06-03 MED ORDER — PREDNISONE 20 MG PO TABS: ORAL_TABLET | ORAL | 0 refills | Status: DC

## 2022-06-03 NOTE — Progress Notes (Signed)
Subjective:    Patient ID: [name], male    DOB: 07-31-1933, 89 y.o.   MRN: [id number]  HPI  Patient is being seen today as a video visit.  He is at home with his daughter.  I am in my office.  He consents to be seen via video.  Video conference call began at 1233.  It ended at 1257.  Patient suffered a fall on Saturday.  He injured his right knee when he fell.  He is unable to bear weight on his right knee.  He went to the emergency room where they obtained a CT scan of his head and of his neck which was negative for any acute fracture.  They also did an x-ray of his right hip that was negative for any fracture and an x-ray of his right knee that showed a large effusion significant tricompartmental arthritis with fracture.  He was discharged home.  He is unable to stand.  He is unable to put weight on his legs.  The family is having to manually carry him back and forth to the bathroom and to help him perform all activities of daily living.  His wife has moderate dementia.  He is unable to care for himself or his wife at the present time.  All of his family work and they are unable to provide 24/7 supervision.  Right now they are having to take him to the bathroom using a wheelchair.  However he is unable to stand and transfer into a wheelchair or vice versa stand to transfer onto commode.  They are very interested to see what home health nursing can help provide in the way of services.  His primary pain is due to severe pain in his right knee.  He is trying a hinged knee brace without any benefit. Past Medical History:  Diagnosis Date   Atrial flutter ([place])    BPH (benign prostatic hyperplasia)    Coronary artery disease    a. s/p MI tx with Cypher DES to pRCA in 4/04;  b. Echocardiogram 7/09: Normal LV function.  c. Nuclear study 3/13 no ischemia;  d. ETT 2/14 neg;  e. admx with CP => LHC (01/30/2013):  pLAD 40-50%, oD1 70-80%, oOM1 40%, pRCA stent patent, mid RCA 30%, EF 60-65%. => med Rx.    DDD (degenerative disc disease)    Diabetes mellitus without complication ([place])    Dyslipidemia    Gait disorder 07/13/2018   Hemorrhoids    Hx MRSA infection    left buttocks abscess   Hx of echocardiogram    a. Echocardiogram (01/31/2013): Mild focal basal and mild concentric hypertrophy of the septum, EF 50-55%, normal wall motion, grade 1 diastolic dysfunction, trivial AI, MAC, mild LAE, PASP 35   Hyperlipidemia    Hypertension    Hypothyroidism    Meniere's disease    Status post shunt   Myocardial infarction [facility]) 2008   Occlusion and stenosis of carotid artery without mention of cerebral infarction    40-59% on carotid doppler 2014; [place] (01/2013): R 1-39%, L 60-79%   Rotator cuff injury    chronic rotator cuff injury status post repair   Seizure disorder [facility])    Stroke ([place])    a. 01/2013=> post cardiac cath CVA to L post communicating artery system; R sided weakness   Syncope    Past Surgical History:  Procedure Laterality Date   CARDIOVERSION N/A 05/26/2022   Procedure: CARDIOVERSION;  Surgeon: Freada Bergeron, MD;  Location: [facility] ENDOSCOPY;  Service: Cardiovascular;  Laterality: N/A;   COLONOSCOPY     CORONARY ANGIOPLASTY WITH STENT PLACEMENT  07/14/2002   Stent to the right coronary artery   ENDOLYMPHATIC SHUNT DECOMPRESSION  06/11/2009    Right endolymphatic sac decompression and shunt  placement   HERNIA REPAIR  04/10/2008   scrotal hernia repair   LEFT HEART CATHETERIZATION WITH CORONARY ANGIOGRAM N/A 01/30/2013   Procedure: LEFT HEART CATHETERIZATION WITH CORONARY ANGIOGRAM;  Surgeon: Larey Dresser, MD;  Location: [facility] CATH LAB;  Service: Cardiovascular;  Laterality: N/A;   PACEMAKER IMPLANT N/A 08/29/2020   Procedure: PACEMAKER IMPLANT;  Surgeon: [name], MD;  Location: [facility] CV LAB;  Service: Cardiovascular;  Laterality: N/A;   ROTATOR CUFF REPAIR     for chronic rotator cuff injury   TEE WITHOUT CARDIOVERSION N/A 05/26/2022   Procedure:  TRANSESOPHAGEAL ECHOCARDIOGRAM (TEE);  Surgeon: Freada Bergeron, MD;  Location: [facility] ENDOSCOPY;  Service: Cardiovascular;  Laterality: N/A;   Current Outpatient Medications on File Prior to Visit  Medication Sig Dispense Refill   acetaminophen (TYLENOL) 325 MG tablet Take 1-2 tablets (325-650 mg total) by mouth every 4 (four) hours as needed for mild pain.     amLODipine (NORVASC) 10 MG tablet Take 1 tablet (10 mg total) by mouth daily. 90 tablet 3   atorvastatin (LIPITOR) 80 MG tablet TAKE 1 TABLET BY MOUTH EVERY DAY AT 6 PM 90 tablet 3   carbamazepine (CARBATROL) 300 MG 12 hr capsule TAKE 1 CAPSULE(300 MG) BY MOUTH TWICE DAILY (Patient taking differently: Take 300 mg by mouth 2 (two) times daily.) 180 capsule 3   cloNIDine (CATAPRES) 0.2 MG tablet TAKE 1 TABLET(0.2 MG) BY MOUTH TWICE DAILY (Patient taking differently: Take 0.2 mg by mouth daily.) 180 tablet 3   donepezil (ARICEPT) 5 MG tablet TAKE 1 TABLET(5 MG) BY MOUTH AT BEDTIME 90 tablet 1   escitalopram (LEXAPRO) 5 MG tablet TAKE 1 TABLET BY MOUTH EVERY DAY AT BEDTIME 90 tablet 0   furosemide (LASIX) 20 MG tablet TAKE 1/2 TABLET(10 MG) BY MOUTH DAILY 45 tablet 1   glucose blood test strip 1 each by Other route as needed. Use as instructed 100 each 3   glucose monitoring kit (FREESTYLE) monitoring kit 1 each by Does not apply route as needed. 1 each 0   HYDROcodone-acetaminophen (NORCO/VICODIN) 5-325 MG tablet Take 1 tablet by mouth every 6 (six) hours as needed. 12 tablet 0   isosorbide mononitrate (IMDUR) 60 MG 24 hr tablet TAKE 1/2 TABLET BY MOUTH EVERY DAY 90 tablet 3   Lancets (ACCU-CHEK SAFE-T PRO) lancets 1 each by Other route as needed. Use as instructed 100 each 3   levETIRAcetam (KEPPRA) 500 MG tablet Take 1 tablet (500 mg total) by mouth 2 (two) times daily. 60 tablet 6   levothyroxine (SYNTHROID) 88 MCG tablet TAKE 1 TABLET(88 MCG) BY MOUTH DAILY 90 tablet 3   losartan (COZAAR) 100 MG tablet Take 1 tablet (100 mg total) by  mouth daily. 90 tablet 3   Multiple Vitamins-Minerals (MULTIVITAMINS THER. W/MINERALS) TABS Take 1 tablet by mouth at bedtime.       MYRBETRIQ 50 MG TB24 tablet Take 50 mg by mouth daily.     rivaroxaban (XARELTO) 20 MG TABS tablet Take 1 tablet (20 mg total) by mouth daily with supper. 90 tablet 3   tamsulosin (FLOMAX) 0.4 MG CAPS capsule Take 1 capsule (0.4 mg total) by mouth daily after supper. TAKE 1 CAPSULE(0.4 MG)  BY MOUTH DAILY AFTER AND SUPPER Strength: 0.4 mg 90 capsule 3   No current facility-administered medications on file prior to visit.   Allergies  Allergen Reactions   Penicillins     Did it involve swelling of the face/tongue/throat, SOB, or low BP? Unknown Did it involve sudden or severe rash/hives, skin peeling, or any reaction on the inside of your mouth or nose? Unknown Did you need to seek medical attention at a hospital or doctor's office? No When did it last happen?      Decades Ago If all above answers are "NO", may proceed with cephalosporin use.     Social History   Socioeconomic History   Marital status: Married    Spouse name: Not on file   Number of children: 4   Years of education: 12th   Highest education level: Not on file  Occupational History   Occupation: Retired  Tobacco Use   Smoking status: Former    Years: 5    Types: Cigarettes    Quit date: 08/19/1956    Years since quitting: 65.8   Smokeless tobacco: Former    Types: Nurse, children's Use: Never used  Substance and Sexual Activity   Alcohol use: No   Drug use: No   Sexual activity: Never  Other Topics Concern   Not on file  Social History Narrative   Lives with wife.   Children live nearby and help with care.    Social Determinants of Health   Financial Resource Strain: High Risk (03/12/2022)   Overall Financial Resource Strain (CARDIA)    Difficulty of Paying Living Expenses: Hard  Food Insecurity: No Food Insecurity (03/12/2022)   Hunger Vital Sign    Worried  About Running Out of Food in the Last Year: Never true    Ran Out of Food in the Last Year: Never true  Transportation Needs: No Transportation Needs (03/12/2022)   PRAPARE - Hydrologist (Medical): No    Lack of Transportation (Non-Medical): No  Physical Activity: Inactive (03/12/2022)   Exercise Vital Sign    Days of Exercise per Week: 2 days    Minutes of Exercise per Session: 0 min  Stress: No Stress Concern Present (03/12/2022)   [place]    Feeling of Stress : Not at all  Social Connections: Moderately Integrated (03/12/2022)   Social Connection and Isolation Panel [NHANES]    Frequency of Communication with Friends and Family: More than three times a week    Frequency of Social Gatherings with Friends and Family: More than three times a week    Attends Religious Services: Never    Marine scientist or Organizations: Yes    Attends Archivist Meetings: 1 to 4 times per year    Marital Status: Married  Human resources officer Violence: Not At Risk (03/12/2022)   Humiliation, Afraid, Rape, and Kick questionnaire    Fear of Current or Ex-Partner: No    Emotionally Abused: No    Physically Abused: No    Sexually Abused: No     Review of Systems  All other systems reviewed and are negative.      Objective:   Physical Exam Constitutional:      Appearance: Normal appearance. He is not ill-appearing or toxic-appearing.  Musculoskeletal:     Right knee: Swelling and effusion present. Decreased range of motion. Tenderness present.  Neurological:  Mental Status: He is alert.           Assessment & Plan:   Pain in joint of right knee - Plan: Ambulatory referral to [place] to stand up - Plan: Ambulatory referral to Home Health  Weakness of both lower extremities - Plan: Ambulatory referral to [place]  Age-related cognitive decline - Plan:  Ambulatory referral to [place] I reviewed the x-rays of his hip, his knee, and a CT of his head and neck from the emergency room.  These were all negative for any fracture.  However he is unable to bear any weight on his right leg due to severe pain in his right knee.  I suspect that the patient likely suffered a cartilage tear during the fall.  I am unable to determine this based on the video visit suspect a meniscal tear or perhaps a ligament injury.  Therefore recommended a knee immobilizer if a hinged knee brace is not helping.  I will contact and consult home health nursing to assist in the home safety evaluation.  Perhaps they can help provide the patient with wheelchairs, bedside commode, etc.  Patient also needs new for send supervision gave patient daughter names of anxiety to help this.  Also discussed the protocol for placement in a nursing facility.  I recommended she contact local nursing homes and explained the situation to determine if there is bed availability and then I would be happy to complete the [place] 2 form if they do not feel that they can provide care at home.  I would recommend a prednisone taper pack essentially acting like a cortisone shot in the knee to try to calm down the inflammation to facilitate mobility.

## 2022-06-05 ENCOUNTER — Other Ambulatory Visit: Payer: Self-pay | Admitting: Family Medicine

## 2022-06-05 DIAGNOSIS — I252 Old myocardial infarction: Secondary | ICD-10-CM | POA: Diagnosis not present

## 2022-06-05 DIAGNOSIS — H8109 Meniere's disease, unspecified ear: Secondary | ICD-10-CM | POA: Diagnosis not present

## 2022-06-05 DIAGNOSIS — G40909 Epilepsy, unspecified, not intractable, without status epilepticus: Secondary | ICD-10-CM | POA: Diagnosis not present

## 2022-06-05 DIAGNOSIS — M5116 Intervertebral disc disorders with radiculopathy, lumbar region: Secondary | ICD-10-CM | POA: Diagnosis not present

## 2022-06-05 DIAGNOSIS — E039 Hypothyroidism, unspecified: Secondary | ICD-10-CM | POA: Diagnosis not present

## 2022-06-05 DIAGNOSIS — N400 Enlarged prostate without lower urinary tract symptoms: Secondary | ICD-10-CM | POA: Diagnosis not present

## 2022-06-05 DIAGNOSIS — E119 Type 2 diabetes mellitus without complications: Secondary | ICD-10-CM | POA: Diagnosis not present

## 2022-06-05 DIAGNOSIS — I4892 Unspecified atrial flutter: Secondary | ICD-10-CM | POA: Diagnosis not present

## 2022-06-05 DIAGNOSIS — I480 Paroxysmal atrial fibrillation: Secondary | ICD-10-CM | POA: Diagnosis not present

## 2022-06-05 DIAGNOSIS — I2510 Atherosclerotic heart disease of native coronary artery without angina pectoris: Secondary | ICD-10-CM | POA: Diagnosis not present

## 2022-06-05 DIAGNOSIS — I5032 Chronic diastolic (congestive) heart failure: Secondary | ICD-10-CM | POA: Diagnosis not present

## 2022-06-05 DIAGNOSIS — G8929 Other chronic pain: Secondary | ICD-10-CM | POA: Diagnosis not present

## 2022-06-05 DIAGNOSIS — M1990 Unspecified osteoarthritis, unspecified site: Secondary | ICD-10-CM | POA: Diagnosis not present

## 2022-06-05 DIAGNOSIS — E785 Hyperlipidemia, unspecified: Secondary | ICD-10-CM | POA: Diagnosis not present

## 2022-06-05 DIAGNOSIS — I110 Hypertensive heart disease with heart failure: Secondary | ICD-10-CM | POA: Diagnosis not present

## 2022-06-05 DIAGNOSIS — I6523 Occlusion and stenosis of bilateral carotid arteries: Secondary | ICD-10-CM | POA: Diagnosis not present

## 2022-06-07 ENCOUNTER — Telehealth: Payer: Self-pay

## 2022-06-07 NOTE — Telephone Encounter (Signed)
Patient's daughter called to follow up on refill request for  cloNIDine (CATAPRES) 0.2 MG tablet   Maudie Mercury stated patient only has enough medication to get through the rest of today.  Pharmacy confirmed as:  [facility] [place], [place] AT [place] [id number], [place] [address] Phone: [phone]  Fax: [phone] DEA #: [id number]   Please advise when refill sent in at [phone].

## 2022-06-07 NOTE — Telephone Encounter (Signed)
     Patient  visit on 3/10  at [facility] Have you been able to follow up with your primary care physician? Yes   The patient was or was not able to obtain any needed medicine or equipment. Yes   Are there diet recommendations that you are having difficulty following? Na   Patient expresses understanding of discharge instructions and education provided has no other needs at this time.  Yes     [place] [phone] [address], [place], [place] [address] Phone: [phone] Email: [email or web address]

## 2022-06-09 DIAGNOSIS — E785 Hyperlipidemia, unspecified: Secondary | ICD-10-CM | POA: Diagnosis not present

## 2022-06-09 DIAGNOSIS — G40909 Epilepsy, unspecified, not intractable, without status epilepticus: Secondary | ICD-10-CM | POA: Diagnosis not present

## 2022-06-09 DIAGNOSIS — I2510 Atherosclerotic heart disease of native coronary artery without angina pectoris: Secondary | ICD-10-CM | POA: Diagnosis not present

## 2022-06-09 DIAGNOSIS — I6523 Occlusion and stenosis of bilateral carotid arteries: Secondary | ICD-10-CM | POA: Diagnosis not present

## 2022-06-09 DIAGNOSIS — H8109 Meniere's disease, unspecified ear: Secondary | ICD-10-CM | POA: Diagnosis not present

## 2022-06-09 DIAGNOSIS — I480 Paroxysmal atrial fibrillation: Secondary | ICD-10-CM | POA: Diagnosis not present

## 2022-06-09 DIAGNOSIS — I4892 Unspecified atrial flutter: Secondary | ICD-10-CM | POA: Diagnosis not present

## 2022-06-09 DIAGNOSIS — I110 Hypertensive heart disease with heart failure: Secondary | ICD-10-CM | POA: Diagnosis not present

## 2022-06-09 DIAGNOSIS — E119 Type 2 diabetes mellitus without complications: Secondary | ICD-10-CM | POA: Diagnosis not present

## 2022-06-09 DIAGNOSIS — M5116 Intervertebral disc disorders with radiculopathy, lumbar region: Secondary | ICD-10-CM | POA: Diagnosis not present

## 2022-06-09 DIAGNOSIS — G8929 Other chronic pain: Secondary | ICD-10-CM | POA: Diagnosis not present

## 2022-06-09 DIAGNOSIS — N400 Enlarged prostate without lower urinary tract symptoms: Secondary | ICD-10-CM | POA: Diagnosis not present

## 2022-06-09 DIAGNOSIS — I252 Old myocardial infarction: Secondary | ICD-10-CM | POA: Diagnosis not present

## 2022-06-09 DIAGNOSIS — E039 Hypothyroidism, unspecified: Secondary | ICD-10-CM | POA: Diagnosis not present

## 2022-06-09 DIAGNOSIS — M1990 Unspecified osteoarthritis, unspecified site: Secondary | ICD-10-CM | POA: Diagnosis not present

## 2022-06-09 DIAGNOSIS — I5032 Chronic diastolic (congestive) heart failure: Secondary | ICD-10-CM | POA: Diagnosis not present

## 2022-06-10 DIAGNOSIS — I480 Paroxysmal atrial fibrillation: Secondary | ICD-10-CM | POA: Diagnosis not present

## 2022-06-10 DIAGNOSIS — I5032 Chronic diastolic (congestive) heart failure: Secondary | ICD-10-CM | POA: Diagnosis not present

## 2022-06-10 DIAGNOSIS — I110 Hypertensive heart disease with heart failure: Secondary | ICD-10-CM | POA: Diagnosis not present

## 2022-06-10 DIAGNOSIS — M1990 Unspecified osteoarthritis, unspecified site: Secondary | ICD-10-CM | POA: Diagnosis not present

## 2022-06-10 DIAGNOSIS — I4892 Unspecified atrial flutter: Secondary | ICD-10-CM | POA: Diagnosis not present

## 2022-06-10 DIAGNOSIS — G8929 Other chronic pain: Secondary | ICD-10-CM | POA: Diagnosis not present

## 2022-06-10 DIAGNOSIS — E119 Type 2 diabetes mellitus without complications: Secondary | ICD-10-CM | POA: Diagnosis not present

## 2022-06-10 DIAGNOSIS — I252 Old myocardial infarction: Secondary | ICD-10-CM | POA: Diagnosis not present

## 2022-06-10 DIAGNOSIS — H8109 Meniere's disease, unspecified ear: Secondary | ICD-10-CM | POA: Diagnosis not present

## 2022-06-10 DIAGNOSIS — I2510 Atherosclerotic heart disease of native coronary artery without angina pectoris: Secondary | ICD-10-CM | POA: Diagnosis not present

## 2022-06-10 DIAGNOSIS — N400 Enlarged prostate without lower urinary tract symptoms: Secondary | ICD-10-CM | POA: Diagnosis not present

## 2022-06-10 DIAGNOSIS — M5116 Intervertebral disc disorders with radiculopathy, lumbar region: Secondary | ICD-10-CM | POA: Diagnosis not present

## 2022-06-10 DIAGNOSIS — E039 Hypothyroidism, unspecified: Secondary | ICD-10-CM | POA: Diagnosis not present

## 2022-06-10 DIAGNOSIS — I6523 Occlusion and stenosis of bilateral carotid arteries: Secondary | ICD-10-CM | POA: Diagnosis not present

## 2022-06-10 DIAGNOSIS — E785 Hyperlipidemia, unspecified: Secondary | ICD-10-CM | POA: Diagnosis not present

## 2022-06-10 DIAGNOSIS — G40909 Epilepsy, unspecified, not intractable, without status epilepticus: Secondary | ICD-10-CM | POA: Diagnosis not present

## 2022-06-12 ENCOUNTER — Other Ambulatory Visit: Payer: Self-pay | Admitting: Family Medicine

## 2022-06-12 DIAGNOSIS — I110 Hypertensive heart disease with heart failure: Secondary | ICD-10-CM | POA: Diagnosis not present

## 2022-06-12 DIAGNOSIS — E119 Type 2 diabetes mellitus without complications: Secondary | ICD-10-CM | POA: Diagnosis not present

## 2022-06-12 DIAGNOSIS — G8929 Other chronic pain: Secondary | ICD-10-CM | POA: Diagnosis not present

## 2022-06-12 DIAGNOSIS — I4892 Unspecified atrial flutter: Secondary | ICD-10-CM | POA: Diagnosis not present

## 2022-06-12 DIAGNOSIS — I5032 Chronic diastolic (congestive) heart failure: Secondary | ICD-10-CM | POA: Diagnosis not present

## 2022-06-12 DIAGNOSIS — I6523 Occlusion and stenosis of bilateral carotid arteries: Secondary | ICD-10-CM | POA: Diagnosis not present

## 2022-06-12 DIAGNOSIS — I480 Paroxysmal atrial fibrillation: Secondary | ICD-10-CM | POA: Diagnosis not present

## 2022-06-12 DIAGNOSIS — I252 Old myocardial infarction: Secondary | ICD-10-CM | POA: Diagnosis not present

## 2022-06-12 DIAGNOSIS — I2510 Atherosclerotic heart disease of native coronary artery without angina pectoris: Secondary | ICD-10-CM | POA: Diagnosis not present

## 2022-06-12 DIAGNOSIS — G40909 Epilepsy, unspecified, not intractable, without status epilepticus: Secondary | ICD-10-CM | POA: Diagnosis not present

## 2022-06-12 DIAGNOSIS — E039 Hypothyroidism, unspecified: Secondary | ICD-10-CM | POA: Diagnosis not present

## 2022-06-12 DIAGNOSIS — H8109 Meniere's disease, unspecified ear: Secondary | ICD-10-CM | POA: Diagnosis not present

## 2022-06-12 DIAGNOSIS — M1990 Unspecified osteoarthritis, unspecified site: Secondary | ICD-10-CM | POA: Diagnosis not present

## 2022-06-12 DIAGNOSIS — E785 Hyperlipidemia, unspecified: Secondary | ICD-10-CM | POA: Diagnosis not present

## 2022-06-12 DIAGNOSIS — N400 Enlarged prostate without lower urinary tract symptoms: Secondary | ICD-10-CM | POA: Diagnosis not present

## 2022-06-12 DIAGNOSIS — M5116 Intervertebral disc disorders with radiculopathy, lumbar region: Secondary | ICD-10-CM | POA: Diagnosis not present

## 2022-06-14 DIAGNOSIS — I2510 Atherosclerotic heart disease of native coronary artery without angina pectoris: Secondary | ICD-10-CM | POA: Diagnosis not present

## 2022-06-14 DIAGNOSIS — N400 Enlarged prostate without lower urinary tract symptoms: Secondary | ICD-10-CM | POA: Diagnosis not present

## 2022-06-14 DIAGNOSIS — G40909 Epilepsy, unspecified, not intractable, without status epilepticus: Secondary | ICD-10-CM | POA: Diagnosis not present

## 2022-06-14 DIAGNOSIS — M1990 Unspecified osteoarthritis, unspecified site: Secondary | ICD-10-CM | POA: Diagnosis not present

## 2022-06-14 DIAGNOSIS — E119 Type 2 diabetes mellitus without complications: Secondary | ICD-10-CM | POA: Diagnosis not present

## 2022-06-14 DIAGNOSIS — I110 Hypertensive heart disease with heart failure: Secondary | ICD-10-CM | POA: Diagnosis not present

## 2022-06-14 DIAGNOSIS — I252 Old myocardial infarction: Secondary | ICD-10-CM | POA: Diagnosis not present

## 2022-06-14 DIAGNOSIS — H8109 Meniere's disease, unspecified ear: Secondary | ICD-10-CM | POA: Diagnosis not present

## 2022-06-14 DIAGNOSIS — I480 Paroxysmal atrial fibrillation: Secondary | ICD-10-CM | POA: Diagnosis not present

## 2022-06-14 DIAGNOSIS — I6523 Occlusion and stenosis of bilateral carotid arteries: Secondary | ICD-10-CM | POA: Diagnosis not present

## 2022-06-14 DIAGNOSIS — E039 Hypothyroidism, unspecified: Secondary | ICD-10-CM | POA: Diagnosis not present

## 2022-06-14 DIAGNOSIS — G8929 Other chronic pain: Secondary | ICD-10-CM | POA: Diagnosis not present

## 2022-06-14 DIAGNOSIS — E785 Hyperlipidemia, unspecified: Secondary | ICD-10-CM | POA: Diagnosis not present

## 2022-06-14 DIAGNOSIS — I5032 Chronic diastolic (congestive) heart failure: Secondary | ICD-10-CM | POA: Diagnosis not present

## 2022-06-14 DIAGNOSIS — M5116 Intervertebral disc disorders with radiculopathy, lumbar region: Secondary | ICD-10-CM | POA: Diagnosis not present

## 2022-06-14 DIAGNOSIS — I4892 Unspecified atrial flutter: Secondary | ICD-10-CM | POA: Diagnosis not present

## 2022-06-14 NOTE — Telephone Encounter (Signed)
Requested medications are due for refill today.  no  Requested medications are on the active medications list.  yes  Last refill. 05/10/2022 #90 3 rf  Future visit scheduled.   no  Notes to clinic.  Sig from Med list differs from RX.    Requested Prescriptions  Pending Prescriptions Disp Refills   losartan (COZAAR) 100 MG tablet [Pharmacy Med Name: LOSARTAN 100MG  TABLETS] 90 tablet 3    Sig: TAKE 1/2 TABLET BY MOUTH EVERY DAY     Cardiovascular:  Angiotensin Receptor Blockers Failed - 06/12/2022  1:50 PM      Failed - Last BP in normal range    BP Readings from Last 1 Encounters:  05/30/22 (!) 163/79         Failed - Valid encounter within last 6 months    Recent Outpatient Visits           1 year ago Essential hypertension   [place], Cammie Mcgee, MD   1 year ago Hematuria, unspecified type   [place] [name], MD   1 year ago Paroxysmal atrial fibrillation [facility])   [place] Dennard Schaumann, Cammie Mcgee, MD   1 year ago Paroxysmal atrial fibrillation [facility])   [place] [name], MD   1 year ago Herpes zoster without complication   [place] Eulogio Bear, NP              Passed - Cr in normal range and within 180 days    Creat  Date Value Ref Range Status  03/29/2022 1.14 0.70 - 1.22 mg/dL Final   Creatinine, Ser  Date Value Ref Range Status  05/30/2022 0.86 0.61 - 1.24 mg/dL Final         Passed - K in normal range and within 180 days    Potassium  Date Value Ref Range Status  05/30/2022 3.9 3.5 - 5.1 mmol/L Final         Passed - Patient is not pregnant

## 2022-06-15 DIAGNOSIS — M170 Bilateral primary osteoarthritis of knee: Secondary | ICD-10-CM | POA: Diagnosis not present

## 2022-06-16 ENCOUNTER — Encounter (HOSPITAL_COMMUNITY): Payer: Self-pay

## 2022-06-16 ENCOUNTER — Ambulatory Visit (HOSPITAL_COMMUNITY): Payer: Medicare Other | Admitting: Physician Assistant

## 2022-06-18 DIAGNOSIS — I480 Paroxysmal atrial fibrillation: Secondary | ICD-10-CM | POA: Diagnosis not present

## 2022-06-18 DIAGNOSIS — I252 Old myocardial infarction: Secondary | ICD-10-CM | POA: Diagnosis not present

## 2022-06-18 DIAGNOSIS — G8929 Other chronic pain: Secondary | ICD-10-CM | POA: Diagnosis not present

## 2022-06-18 DIAGNOSIS — M1990 Unspecified osteoarthritis, unspecified site: Secondary | ICD-10-CM | POA: Diagnosis not present

## 2022-06-18 DIAGNOSIS — I6523 Occlusion and stenosis of bilateral carotid arteries: Secondary | ICD-10-CM | POA: Diagnosis not present

## 2022-06-18 DIAGNOSIS — I4892 Unspecified atrial flutter: Secondary | ICD-10-CM | POA: Diagnosis not present

## 2022-06-18 DIAGNOSIS — N400 Enlarged prostate without lower urinary tract symptoms: Secondary | ICD-10-CM | POA: Diagnosis not present

## 2022-06-18 DIAGNOSIS — H8109 Meniere's disease, unspecified ear: Secondary | ICD-10-CM | POA: Diagnosis not present

## 2022-06-18 DIAGNOSIS — I5032 Chronic diastolic (congestive) heart failure: Secondary | ICD-10-CM | POA: Diagnosis not present

## 2022-06-18 DIAGNOSIS — E119 Type 2 diabetes mellitus without complications: Secondary | ICD-10-CM | POA: Diagnosis not present

## 2022-06-18 DIAGNOSIS — I2510 Atherosclerotic heart disease of native coronary artery without angina pectoris: Secondary | ICD-10-CM | POA: Diagnosis not present

## 2022-06-18 DIAGNOSIS — E785 Hyperlipidemia, unspecified: Secondary | ICD-10-CM | POA: Diagnosis not present

## 2022-06-18 DIAGNOSIS — G40909 Epilepsy, unspecified, not intractable, without status epilepticus: Secondary | ICD-10-CM | POA: Diagnosis not present

## 2022-06-18 DIAGNOSIS — I110 Hypertensive heart disease with heart failure: Secondary | ICD-10-CM | POA: Diagnosis not present

## 2022-06-18 DIAGNOSIS — E039 Hypothyroidism, unspecified: Secondary | ICD-10-CM | POA: Diagnosis not present

## 2022-06-18 DIAGNOSIS — M5116 Intervertebral disc disorders with radiculopathy, lumbar region: Secondary | ICD-10-CM | POA: Diagnosis not present

## 2022-06-21 DIAGNOSIS — H8109 Meniere's disease, unspecified ear: Secondary | ICD-10-CM | POA: Diagnosis not present

## 2022-06-21 DIAGNOSIS — I480 Paroxysmal atrial fibrillation: Secondary | ICD-10-CM | POA: Diagnosis not present

## 2022-06-21 DIAGNOSIS — I4892 Unspecified atrial flutter: Secondary | ICD-10-CM | POA: Diagnosis not present

## 2022-06-21 DIAGNOSIS — E785 Hyperlipidemia, unspecified: Secondary | ICD-10-CM | POA: Diagnosis not present

## 2022-06-21 DIAGNOSIS — G40909 Epilepsy, unspecified, not intractable, without status epilepticus: Secondary | ICD-10-CM | POA: Diagnosis not present

## 2022-06-21 DIAGNOSIS — I6523 Occlusion and stenosis of bilateral carotid arteries: Secondary | ICD-10-CM | POA: Diagnosis not present

## 2022-06-21 DIAGNOSIS — M5116 Intervertebral disc disorders with radiculopathy, lumbar region: Secondary | ICD-10-CM | POA: Diagnosis not present

## 2022-06-21 DIAGNOSIS — M1990 Unspecified osteoarthritis, unspecified site: Secondary | ICD-10-CM | POA: Diagnosis not present

## 2022-06-21 DIAGNOSIS — I2510 Atherosclerotic heart disease of native coronary artery without angina pectoris: Secondary | ICD-10-CM | POA: Diagnosis not present

## 2022-06-21 DIAGNOSIS — N400 Enlarged prostate without lower urinary tract symptoms: Secondary | ICD-10-CM | POA: Diagnosis not present

## 2022-06-21 DIAGNOSIS — I252 Old myocardial infarction: Secondary | ICD-10-CM | POA: Diagnosis not present

## 2022-06-21 DIAGNOSIS — E119 Type 2 diabetes mellitus without complications: Secondary | ICD-10-CM | POA: Diagnosis not present

## 2022-06-21 DIAGNOSIS — E039 Hypothyroidism, unspecified: Secondary | ICD-10-CM | POA: Diagnosis not present

## 2022-06-21 DIAGNOSIS — G8929 Other chronic pain: Secondary | ICD-10-CM | POA: Diagnosis not present

## 2022-06-21 DIAGNOSIS — I5032 Chronic diastolic (congestive) heart failure: Secondary | ICD-10-CM | POA: Diagnosis not present

## 2022-06-21 DIAGNOSIS — I110 Hypertensive heart disease with heart failure: Secondary | ICD-10-CM | POA: Diagnosis not present

## 2022-06-22 DIAGNOSIS — E119 Type 2 diabetes mellitus without complications: Secondary | ICD-10-CM | POA: Diagnosis not present

## 2022-06-22 DIAGNOSIS — G40909 Epilepsy, unspecified, not intractable, without status epilepticus: Secondary | ICD-10-CM | POA: Diagnosis not present

## 2022-06-22 DIAGNOSIS — I4892 Unspecified atrial flutter: Secondary | ICD-10-CM | POA: Diagnosis not present

## 2022-06-22 DIAGNOSIS — G8929 Other chronic pain: Secondary | ICD-10-CM | POA: Diagnosis not present

## 2022-06-22 DIAGNOSIS — I6523 Occlusion and stenosis of bilateral carotid arteries: Secondary | ICD-10-CM | POA: Diagnosis not present

## 2022-06-22 DIAGNOSIS — M5116 Intervertebral disc disorders with radiculopathy, lumbar region: Secondary | ICD-10-CM | POA: Diagnosis not present

## 2022-06-22 DIAGNOSIS — I5032 Chronic diastolic (congestive) heart failure: Secondary | ICD-10-CM | POA: Diagnosis not present

## 2022-06-22 DIAGNOSIS — E039 Hypothyroidism, unspecified: Secondary | ICD-10-CM | POA: Diagnosis not present

## 2022-06-22 DIAGNOSIS — I252 Old myocardial infarction: Secondary | ICD-10-CM | POA: Diagnosis not present

## 2022-06-22 DIAGNOSIS — H8109 Meniere's disease, unspecified ear: Secondary | ICD-10-CM | POA: Diagnosis not present

## 2022-06-22 DIAGNOSIS — I2510 Atherosclerotic heart disease of native coronary artery without angina pectoris: Secondary | ICD-10-CM | POA: Diagnosis not present

## 2022-06-22 DIAGNOSIS — I480 Paroxysmal atrial fibrillation: Secondary | ICD-10-CM | POA: Diagnosis not present

## 2022-06-22 DIAGNOSIS — N400 Enlarged prostate without lower urinary tract symptoms: Secondary | ICD-10-CM | POA: Diagnosis not present

## 2022-06-22 DIAGNOSIS — M1990 Unspecified osteoarthritis, unspecified site: Secondary | ICD-10-CM | POA: Diagnosis not present

## 2022-06-22 DIAGNOSIS — E785 Hyperlipidemia, unspecified: Secondary | ICD-10-CM | POA: Diagnosis not present

## 2022-06-22 DIAGNOSIS — I110 Hypertensive heart disease with heart failure: Secondary | ICD-10-CM | POA: Diagnosis not present

## 2022-06-23 DIAGNOSIS — G8929 Other chronic pain: Secondary | ICD-10-CM | POA: Diagnosis not present

## 2022-06-23 DIAGNOSIS — M5116 Intervertebral disc disorders with radiculopathy, lumbar region: Secondary | ICD-10-CM | POA: Diagnosis not present

## 2022-06-23 DIAGNOSIS — H8109 Meniere's disease, unspecified ear: Secondary | ICD-10-CM | POA: Diagnosis not present

## 2022-06-23 DIAGNOSIS — I252 Old myocardial infarction: Secondary | ICD-10-CM | POA: Diagnosis not present

## 2022-06-23 DIAGNOSIS — I2510 Atherosclerotic heart disease of native coronary artery without angina pectoris: Secondary | ICD-10-CM | POA: Diagnosis not present

## 2022-06-23 DIAGNOSIS — E039 Hypothyroidism, unspecified: Secondary | ICD-10-CM | POA: Diagnosis not present

## 2022-06-23 DIAGNOSIS — E785 Hyperlipidemia, unspecified: Secondary | ICD-10-CM | POA: Diagnosis not present

## 2022-06-23 DIAGNOSIS — M1990 Unspecified osteoarthritis, unspecified site: Secondary | ICD-10-CM | POA: Diagnosis not present

## 2022-06-23 DIAGNOSIS — I480 Paroxysmal atrial fibrillation: Secondary | ICD-10-CM | POA: Diagnosis not present

## 2022-06-23 DIAGNOSIS — N400 Enlarged prostate without lower urinary tract symptoms: Secondary | ICD-10-CM | POA: Diagnosis not present

## 2022-06-23 DIAGNOSIS — I110 Hypertensive heart disease with heart failure: Secondary | ICD-10-CM | POA: Diagnosis not present

## 2022-06-23 DIAGNOSIS — I4892 Unspecified atrial flutter: Secondary | ICD-10-CM | POA: Diagnosis not present

## 2022-06-23 DIAGNOSIS — I6523 Occlusion and stenosis of bilateral carotid arteries: Secondary | ICD-10-CM | POA: Diagnosis not present

## 2022-06-23 DIAGNOSIS — I5032 Chronic diastolic (congestive) heart failure: Secondary | ICD-10-CM | POA: Diagnosis not present

## 2022-06-23 DIAGNOSIS — G40909 Epilepsy, unspecified, not intractable, without status epilepticus: Secondary | ICD-10-CM | POA: Diagnosis not present

## 2022-06-23 DIAGNOSIS — E119 Type 2 diabetes mellitus without complications: Secondary | ICD-10-CM | POA: Diagnosis not present

## 2022-06-28 DIAGNOSIS — I480 Paroxysmal atrial fibrillation: Secondary | ICD-10-CM | POA: Diagnosis not present

## 2022-06-28 DIAGNOSIS — I2510 Atherosclerotic heart disease of native coronary artery without angina pectoris: Secondary | ICD-10-CM | POA: Diagnosis not present

## 2022-06-28 DIAGNOSIS — G8929 Other chronic pain: Secondary | ICD-10-CM | POA: Diagnosis not present

## 2022-06-28 DIAGNOSIS — N400 Enlarged prostate without lower urinary tract symptoms: Secondary | ICD-10-CM | POA: Diagnosis not present

## 2022-06-28 DIAGNOSIS — M1990 Unspecified osteoarthritis, unspecified site: Secondary | ICD-10-CM | POA: Diagnosis not present

## 2022-06-28 DIAGNOSIS — I110 Hypertensive heart disease with heart failure: Secondary | ICD-10-CM | POA: Diagnosis not present

## 2022-06-28 DIAGNOSIS — I252 Old myocardial infarction: Secondary | ICD-10-CM | POA: Diagnosis not present

## 2022-06-28 DIAGNOSIS — E119 Type 2 diabetes mellitus without complications: Secondary | ICD-10-CM | POA: Diagnosis not present

## 2022-06-28 DIAGNOSIS — G40909 Epilepsy, unspecified, not intractable, without status epilepticus: Secondary | ICD-10-CM | POA: Diagnosis not present

## 2022-06-28 DIAGNOSIS — I4892 Unspecified atrial flutter: Secondary | ICD-10-CM | POA: Diagnosis not present

## 2022-06-28 DIAGNOSIS — E785 Hyperlipidemia, unspecified: Secondary | ICD-10-CM | POA: Diagnosis not present

## 2022-06-28 DIAGNOSIS — E039 Hypothyroidism, unspecified: Secondary | ICD-10-CM | POA: Diagnosis not present

## 2022-06-28 DIAGNOSIS — I5032 Chronic diastolic (congestive) heart failure: Secondary | ICD-10-CM | POA: Diagnosis not present

## 2022-06-28 DIAGNOSIS — I6523 Occlusion and stenosis of bilateral carotid arteries: Secondary | ICD-10-CM | POA: Diagnosis not present

## 2022-06-28 DIAGNOSIS — M5116 Intervertebral disc disorders with radiculopathy, lumbar region: Secondary | ICD-10-CM | POA: Diagnosis not present

## 2022-06-28 DIAGNOSIS — H8109 Meniere's disease, unspecified ear: Secondary | ICD-10-CM | POA: Diagnosis not present

## 2022-06-30 ENCOUNTER — Ambulatory Visit (INDEPENDENT_AMBULATORY_CARE_PROVIDER_SITE_OTHER): Payer: Medicare Other

## 2022-06-30 DIAGNOSIS — I442 Atrioventricular block, complete: Secondary | ICD-10-CM

## 2022-06-30 LAB — CUP PACEART REMOTE DEVICE CHECK
Battery Remaining Longevity: 132 mo
Battery Voltage: 3.03 V
Brady Statistic AP VP Percent: 3.47 %
Brady Statistic AP VS Percent: 0 %
Brady Statistic AS VP Percent: 96.07 %
Brady Statistic AS VS Percent: 0.21 %
Brady Statistic RA Percent Paced: 0.96 %
Brady Statistic RV Percent Paced: 78.06 %
Date Time Interrogation Session: 20240410070339
Implantable Lead Connection Status: 753985
Implantable Lead Connection Status: 753985
Implantable Lead Implant Date: 20220610
Implantable Lead Implant Date: 20220610
Implantable Lead Location: 753859
Implantable Lead Location: 753860
Implantable Lead Model: 3830
Implantable Lead Model: 5076
Implantable Pulse Generator Implant Date: 20220610
Lead Channel Impedance Value: 247 Ohm
Lead Channel Impedance Value: 304 Ohm
Lead Channel Impedance Value: 304 Ohm
Lead Channel Impedance Value: 475 Ohm
Lead Channel Pacing Threshold Amplitude: 0.625 V
Lead Channel Pacing Threshold Amplitude: 0.75 V
Lead Channel Pacing Threshold Pulse Width: 0.4 ms
Lead Channel Pacing Threshold Pulse Width: 0.4 ms
Lead Channel Sensing Intrinsic Amplitude: 1.5 mV
Lead Channel Sensing Intrinsic Amplitude: 1.5 mV
Lead Channel Sensing Intrinsic Amplitude: 6.625 mV
Lead Channel Sensing Intrinsic Amplitude: 6.625 mV
Lead Channel Setting Pacing Amplitude: 1.5 V
Lead Channel Setting Pacing Amplitude: 2 V
Lead Channel Setting Pacing Pulse Width: 0.4 ms
Lead Channel Setting Sensing Sensitivity: 1.2 mV
Zone Setting Status: 755011

## 2022-07-01 DIAGNOSIS — I5032 Chronic diastolic (congestive) heart failure: Secondary | ICD-10-CM | POA: Diagnosis not present

## 2022-07-01 DIAGNOSIS — E039 Hypothyroidism, unspecified: Secondary | ICD-10-CM | POA: Diagnosis not present

## 2022-07-01 DIAGNOSIS — H8109 Meniere's disease, unspecified ear: Secondary | ICD-10-CM | POA: Diagnosis not present

## 2022-07-01 DIAGNOSIS — I6523 Occlusion and stenosis of bilateral carotid arteries: Secondary | ICD-10-CM | POA: Diagnosis not present

## 2022-07-01 DIAGNOSIS — E785 Hyperlipidemia, unspecified: Secondary | ICD-10-CM | POA: Diagnosis not present

## 2022-07-01 DIAGNOSIS — N400 Enlarged prostate without lower urinary tract symptoms: Secondary | ICD-10-CM | POA: Diagnosis not present

## 2022-07-01 DIAGNOSIS — M5116 Intervertebral disc disorders with radiculopathy, lumbar region: Secondary | ICD-10-CM | POA: Diagnosis not present

## 2022-07-01 DIAGNOSIS — I2510 Atherosclerotic heart disease of native coronary artery without angina pectoris: Secondary | ICD-10-CM | POA: Diagnosis not present

## 2022-07-01 DIAGNOSIS — I110 Hypertensive heart disease with heart failure: Secondary | ICD-10-CM | POA: Diagnosis not present

## 2022-07-01 DIAGNOSIS — M1990 Unspecified osteoarthritis, unspecified site: Secondary | ICD-10-CM | POA: Diagnosis not present

## 2022-07-01 DIAGNOSIS — G8929 Other chronic pain: Secondary | ICD-10-CM | POA: Diagnosis not present

## 2022-07-01 DIAGNOSIS — I480 Paroxysmal atrial fibrillation: Secondary | ICD-10-CM | POA: Diagnosis not present

## 2022-07-01 DIAGNOSIS — I252 Old myocardial infarction: Secondary | ICD-10-CM | POA: Diagnosis not present

## 2022-07-01 DIAGNOSIS — E119 Type 2 diabetes mellitus without complications: Secondary | ICD-10-CM | POA: Diagnosis not present

## 2022-07-01 DIAGNOSIS — I4892 Unspecified atrial flutter: Secondary | ICD-10-CM | POA: Diagnosis not present

## 2022-07-01 DIAGNOSIS — G40909 Epilepsy, unspecified, not intractable, without status epilepticus: Secondary | ICD-10-CM | POA: Diagnosis not present

## 2022-07-03 ENCOUNTER — Other Ambulatory Visit: Payer: Self-pay | Admitting: Family Medicine

## 2022-07-03 NOTE — Progress Notes (Deleted)
  Electrophysiology Office Note:   Date:  07/05/2022  ID:  [name], DOB 1933-12-27, MRN [id number]  Primary Cardiologist: Rollene Rotunda, MD Electrophysiologist: Lewayne Bunting, MD   History of Present Illness:   [name] is a 89 y.o. male with h/o intermittent CHB s/p PPM, parox AFib, HTN, CAD, Chronic diastolic HF, and CVA seen today for {VISITTYPE:28148}  Review of systems complete and found to be negative unless listed in HPI.   Device History: Device Information: MDT dual chamber PM, impl 08/2020; dx symptomatic CHB  Studies Reviewed:    PPM Interrogation-  reviewed in detail today,  See PACEART report.  {EKGtoday:28818::"EKG is not ordered today"}  {Select studies to display:26339}  Risk Assessment/Calculations:   {Does this patient have ATRIAL FIBRILLATION?[phone]} No BP recorded.  {Refresh Note OR Click here to enter BP  :1}***        Physical Exam:   VS:  There were no vitals taken for this visit.   Wt Readings from Last 3 Encounters:  05/30/22 172 lb (78 kg)  05/26/22 172 lb (78 kg)  05/10/22 180 lb (81.6 kg)     GEN: Well nourished, well developed in no acute distress NECK: No JVD; No carotid bruits CARDIAC: {EPRHYTHM:28826}, no murmurs, rubs, gallops RESPIRATORY:  Clear to auscultation without rales, wheezing or rhonchi  ABDOMEN: Soft, non-tender, non-distended EXTREMITIES:  No edema; No deformity   ASSESSMENT AND PLAN:    1. CHB s/p Medtronic PPM  Normal PPM function See Pace Art report No changes today  Persistent atrial fibrillation Continue Xarelto for CHA2DS2VASc  of at least 8 *** by device.  *** discussed rhythm control including amiodarone and tikosyn vs rate control No 1cs with CAD  CAD Denies s/s ischemia   {Click here to Review PMH, Prob List, Meds, Allergies, SHx, FHx  :1}   Disposition:   Follow up with {EPPROVIDERS:28135} {EPFOLLOW UP:28173}  Signed, Graciella Freer, PA-C

## 2022-07-05 ENCOUNTER — Ambulatory Visit: Payer: Medicare Other | Attending: Student | Admitting: Student

## 2022-07-05 ENCOUNTER — Other Ambulatory Visit: Payer: Self-pay

## 2022-07-05 DIAGNOSIS — E785 Hyperlipidemia, unspecified: Secondary | ICD-10-CM | POA: Diagnosis not present

## 2022-07-05 DIAGNOSIS — I480 Paroxysmal atrial fibrillation: Secondary | ICD-10-CM | POA: Diagnosis not present

## 2022-07-05 DIAGNOSIS — I6523 Occlusion and stenosis of bilateral carotid arteries: Secondary | ICD-10-CM | POA: Diagnosis not present

## 2022-07-05 DIAGNOSIS — M5116 Intervertebral disc disorders with radiculopathy, lumbar region: Secondary | ICD-10-CM | POA: Diagnosis not present

## 2022-07-05 DIAGNOSIS — G40909 Epilepsy, unspecified, not intractable, without status epilepticus: Secondary | ICD-10-CM | POA: Diagnosis not present

## 2022-07-05 DIAGNOSIS — I5032 Chronic diastolic (congestive) heart failure: Secondary | ICD-10-CM | POA: Diagnosis not present

## 2022-07-05 DIAGNOSIS — I10 Essential (primary) hypertension: Secondary | ICD-10-CM

## 2022-07-05 DIAGNOSIS — G8929 Other chronic pain: Secondary | ICD-10-CM | POA: Diagnosis not present

## 2022-07-05 DIAGNOSIS — I4892 Unspecified atrial flutter: Secondary | ICD-10-CM | POA: Diagnosis not present

## 2022-07-05 DIAGNOSIS — I2510 Atherosclerotic heart disease of native coronary artery without angina pectoris: Secondary | ICD-10-CM | POA: Diagnosis not present

## 2022-07-05 DIAGNOSIS — H8109 Meniere's disease, unspecified ear: Secondary | ICD-10-CM | POA: Diagnosis not present

## 2022-07-05 DIAGNOSIS — E039 Hypothyroidism, unspecified: Secondary | ICD-10-CM | POA: Diagnosis not present

## 2022-07-05 DIAGNOSIS — M1990 Unspecified osteoarthritis, unspecified site: Secondary | ICD-10-CM | POA: Diagnosis not present

## 2022-07-05 DIAGNOSIS — E119 Type 2 diabetes mellitus without complications: Secondary | ICD-10-CM | POA: Diagnosis not present

## 2022-07-05 DIAGNOSIS — N400 Enlarged prostate without lower urinary tract symptoms: Secondary | ICD-10-CM | POA: Diagnosis not present

## 2022-07-05 DIAGNOSIS — I252 Old myocardial infarction: Secondary | ICD-10-CM | POA: Diagnosis not present

## 2022-07-05 DIAGNOSIS — I442 Atrioventricular block, complete: Secondary | ICD-10-CM

## 2022-07-05 DIAGNOSIS — I110 Hypertensive heart disease with heart failure: Secondary | ICD-10-CM | POA: Diagnosis not present

## 2022-07-05 NOTE — Telephone Encounter (Signed)
Requested Prescriptions  Pending Prescriptions Disp Refills   escitalopram (LEXAPRO) 5 MG tablet [Pharmacy Med Name: ESCITALOPRAM 5MG  TABLETS] 90 tablet 2    Sig: TAKE 1 TABLET BY MOUTH EVERY DAY AT BEDTIME     Psychiatry:  Antidepressants - SSRI Failed - 07/03/2022  6:34 AM      Failed - Valid encounter within last 6 months    Recent Outpatient Visits           1 year ago Essential hypertension   [facility] [place], Priscille Heidelberg, MD   1 year ago Hematuria, unspecified type   [facility] Donita Brooks, MD   1 year ago Paroxysmal atrial fibrillation [facility])   [facility] Donita Brooks, MD   1 year ago Paroxysmal atrial fibrillation [facility])   [facility] [place], Priscille Heidelberg, MD   1 year ago Herpes zoster without complication   [facility] Valentino Nose, NP

## 2022-07-05 NOTE — Telephone Encounter (Signed)
Prescription Request  07/05/2022  LOV: 04/08/22  What is the name of the medication or equipment? escitalopram (LEXAPRO) 5 MG tablet [[id number]]  Have you contacted your pharmacy to request a refill? Yes   Which pharmacy would you like this sent to?  [facility] [phone] - [place], [place] - [address] AT [facility] [address] [place] [address] Phone: [phone] Fax: [phone]    Patient notified that their request is being sent to the clinical staff for review and that they should receive a response within 2 business days.   Please advise at [facility] [phone]

## 2022-07-06 NOTE — Telephone Encounter (Signed)
Requested Prescriptions  Pending Prescriptions Disp Refills   escitalopram (LEXAPRO) 5 MG tablet 90 tablet 2    Sig: Take 1 tablet (5 mg total) by mouth at bedtime.     Psychiatry:  Antidepressants - SSRI Failed - 07/05/2022 11:11 AM      Failed - Valid encounter within last 6 months    Recent Outpatient Visits           1 year ago Essential hypertension   [facility] [place], Priscille Heidelberg, MD   1 year ago Hematuria, unspecified type   [facility] Tanya Nones Priscille Heidelberg, MD   1 year ago Paroxysmal atrial fibrillation [facility])   [facility] Medicine [place], Priscille Heidelberg, MD   1 year ago Paroxysmal atrial fibrillation [facility])   [facility] [place], Priscille Heidelberg, MD   1 year ago Herpes zoster without complication   [facility] Valentino Nose, NP

## 2022-07-08 DIAGNOSIS — M1990 Unspecified osteoarthritis, unspecified site: Secondary | ICD-10-CM | POA: Diagnosis not present

## 2022-07-08 DIAGNOSIS — I480 Paroxysmal atrial fibrillation: Secondary | ICD-10-CM | POA: Diagnosis not present

## 2022-07-08 DIAGNOSIS — G8929 Other chronic pain: Secondary | ICD-10-CM | POA: Diagnosis not present

## 2022-07-08 DIAGNOSIS — I6523 Occlusion and stenosis of bilateral carotid arteries: Secondary | ICD-10-CM | POA: Diagnosis not present

## 2022-07-08 DIAGNOSIS — H8109 Meniere's disease, unspecified ear: Secondary | ICD-10-CM | POA: Diagnosis not present

## 2022-07-08 DIAGNOSIS — I2510 Atherosclerotic heart disease of native coronary artery without angina pectoris: Secondary | ICD-10-CM | POA: Diagnosis not present

## 2022-07-08 DIAGNOSIS — E785 Hyperlipidemia, unspecified: Secondary | ICD-10-CM | POA: Diagnosis not present

## 2022-07-08 DIAGNOSIS — E119 Type 2 diabetes mellitus without complications: Secondary | ICD-10-CM | POA: Diagnosis not present

## 2022-07-08 DIAGNOSIS — I252 Old myocardial infarction: Secondary | ICD-10-CM | POA: Diagnosis not present

## 2022-07-08 DIAGNOSIS — E039 Hypothyroidism, unspecified: Secondary | ICD-10-CM | POA: Diagnosis not present

## 2022-07-08 DIAGNOSIS — I5032 Chronic diastolic (congestive) heart failure: Secondary | ICD-10-CM | POA: Diagnosis not present

## 2022-07-08 DIAGNOSIS — N400 Enlarged prostate without lower urinary tract symptoms: Secondary | ICD-10-CM | POA: Diagnosis not present

## 2022-07-08 DIAGNOSIS — I4892 Unspecified atrial flutter: Secondary | ICD-10-CM | POA: Diagnosis not present

## 2022-07-08 DIAGNOSIS — I110 Hypertensive heart disease with heart failure: Secondary | ICD-10-CM | POA: Diagnosis not present

## 2022-07-08 DIAGNOSIS — M5116 Intervertebral disc disorders with radiculopathy, lumbar region: Secondary | ICD-10-CM | POA: Diagnosis not present

## 2022-07-08 DIAGNOSIS — G40909 Epilepsy, unspecified, not intractable, without status epilepticus: Secondary | ICD-10-CM | POA: Diagnosis not present

## 2022-07-13 DIAGNOSIS — I6523 Occlusion and stenosis of bilateral carotid arteries: Secondary | ICD-10-CM | POA: Diagnosis not present

## 2022-07-13 DIAGNOSIS — I4892 Unspecified atrial flutter: Secondary | ICD-10-CM | POA: Diagnosis not present

## 2022-07-13 DIAGNOSIS — M5116 Intervertebral disc disorders with radiculopathy, lumbar region: Secondary | ICD-10-CM | POA: Diagnosis not present

## 2022-07-13 DIAGNOSIS — G40909 Epilepsy, unspecified, not intractable, without status epilepticus: Secondary | ICD-10-CM | POA: Diagnosis not present

## 2022-07-13 DIAGNOSIS — M1990 Unspecified osteoarthritis, unspecified site: Secondary | ICD-10-CM | POA: Diagnosis not present

## 2022-07-13 DIAGNOSIS — I110 Hypertensive heart disease with heart failure: Secondary | ICD-10-CM | POA: Diagnosis not present

## 2022-07-13 DIAGNOSIS — I480 Paroxysmal atrial fibrillation: Secondary | ICD-10-CM | POA: Diagnosis not present

## 2022-07-13 DIAGNOSIS — G8929 Other chronic pain: Secondary | ICD-10-CM | POA: Diagnosis not present

## 2022-07-13 DIAGNOSIS — H8109 Meniere's disease, unspecified ear: Secondary | ICD-10-CM | POA: Diagnosis not present

## 2022-07-13 DIAGNOSIS — E039 Hypothyroidism, unspecified: Secondary | ICD-10-CM | POA: Diagnosis not present

## 2022-07-13 DIAGNOSIS — I252 Old myocardial infarction: Secondary | ICD-10-CM | POA: Diagnosis not present

## 2022-07-13 DIAGNOSIS — N400 Enlarged prostate without lower urinary tract symptoms: Secondary | ICD-10-CM | POA: Diagnosis not present

## 2022-07-13 DIAGNOSIS — I5032 Chronic diastolic (congestive) heart failure: Secondary | ICD-10-CM | POA: Diagnosis not present

## 2022-07-13 DIAGNOSIS — E119 Type 2 diabetes mellitus without complications: Secondary | ICD-10-CM | POA: Diagnosis not present

## 2022-07-13 DIAGNOSIS — E785 Hyperlipidemia, unspecified: Secondary | ICD-10-CM | POA: Diagnosis not present

## 2022-07-13 DIAGNOSIS — I2510 Atherosclerotic heart disease of native coronary artery without angina pectoris: Secondary | ICD-10-CM | POA: Diagnosis not present

## 2022-07-21 DIAGNOSIS — N400 Enlarged prostate without lower urinary tract symptoms: Secondary | ICD-10-CM | POA: Diagnosis not present

## 2022-07-21 DIAGNOSIS — G40909 Epilepsy, unspecified, not intractable, without status epilepticus: Secondary | ICD-10-CM | POA: Diagnosis not present

## 2022-07-21 DIAGNOSIS — M5116 Intervertebral disc disorders with radiculopathy, lumbar region: Secondary | ICD-10-CM | POA: Diagnosis not present

## 2022-07-21 DIAGNOSIS — I480 Paroxysmal atrial fibrillation: Secondary | ICD-10-CM | POA: Diagnosis not present

## 2022-07-21 DIAGNOSIS — I2510 Atherosclerotic heart disease of native coronary artery without angina pectoris: Secondary | ICD-10-CM | POA: Diagnosis not present

## 2022-07-21 DIAGNOSIS — G8929 Other chronic pain: Secondary | ICD-10-CM | POA: Diagnosis not present

## 2022-07-21 DIAGNOSIS — H8109 Meniere's disease, unspecified ear: Secondary | ICD-10-CM | POA: Diagnosis not present

## 2022-07-21 DIAGNOSIS — E785 Hyperlipidemia, unspecified: Secondary | ICD-10-CM | POA: Diagnosis not present

## 2022-07-21 DIAGNOSIS — I252 Old myocardial infarction: Secondary | ICD-10-CM | POA: Diagnosis not present

## 2022-07-21 DIAGNOSIS — I4892 Unspecified atrial flutter: Secondary | ICD-10-CM | POA: Diagnosis not present

## 2022-07-21 DIAGNOSIS — I110 Hypertensive heart disease with heart failure: Secondary | ICD-10-CM | POA: Diagnosis not present

## 2022-07-21 DIAGNOSIS — I5032 Chronic diastolic (congestive) heart failure: Secondary | ICD-10-CM | POA: Diagnosis not present

## 2022-07-21 DIAGNOSIS — E119 Type 2 diabetes mellitus without complications: Secondary | ICD-10-CM | POA: Diagnosis not present

## 2022-07-21 DIAGNOSIS — E039 Hypothyroidism, unspecified: Secondary | ICD-10-CM | POA: Diagnosis not present

## 2022-07-21 DIAGNOSIS — M1990 Unspecified osteoarthritis, unspecified site: Secondary | ICD-10-CM | POA: Diagnosis not present

## 2022-07-21 DIAGNOSIS — I6523 Occlusion and stenosis of bilateral carotid arteries: Secondary | ICD-10-CM | POA: Diagnosis not present

## 2022-07-27 ENCOUNTER — Telehealth: Payer: Self-pay | Admitting: Pharmacist

## 2022-07-27 DIAGNOSIS — M1711 Unilateral primary osteoarthritis, right knee: Secondary | ICD-10-CM | POA: Diagnosis not present

## 2022-07-27 NOTE — Progress Notes (Signed)
Care Management & Coordination Services Pharmacy Team  Reason for Encounter: Hypertension  Contacted patient to discuss hypertension disease state. Spoke with patient on 07/27/2022     Current antihypertensive regimen:  Amlodpine 10 mg daily Clonidine 0.2 mg twice daily Furosemide 20 mg daily Losartan 100 mg daily  Patient verbally confirms he is taking the above medications as directed. Yes  How often are you checking your Blood Pressure? daily  he checks his blood pressure in the afternoon after taking his medication.  Current home BP readings: 140's/60's  Wrist or arm cuff: arm Caffeine intake: 2-3 cups of coffee a day 1/2 decaf coffee  Any readings above 180/100? No If yes any symptoms of hypertensive emergency? patient denies any symptoms of high blood pressure  What recent interventions/DTPs have been made by any provider to improve Blood Pressure control since last CPP Visit: No recent interventions or DTPs.  Any recent hospitalizations or ED visits since last visit with CPP? No  What diet changes have been made to improve Blood Pressure Control?  Good diet, controlling weight  What exercise is being done to improve your Blood Pressure Control?  Does physical therapy exercises regularly.  Adherence Review: Is the patient currently on ACE/ARB medication? Yes Does the patient have >5 day gap between last estimated fill dates? No  Star Rating Drugs:  Atorvastatin 80 mg last filled 07/03/2022 90 DS Losartan 100 mg last filled 06/17/2022 23 DS   Chart Updates: Recent office visits:  06/03/2022 VV (PCP) Donita Brooks, MD; I would recommend a prednisone taper pack essentially acting like a cortisone shot in the knee to try to calm down the inflammation to facilitate mobility   Recent consult visits:  05/10/2022 OV (Cardiology) Sherie Don, NP; Will set him up with TEE/DCCV next available - will increase losartan to 100mg    Hospital visits:  05/30/2022 ED visit  for Arthritis of knee -Rx hydrocodone-acetaminophen 5-325 mg q 6hrs prn  05/26/2022 Admission for Atrial Flutter Surgery -  TRANSESOPHAGEAL ECHOCARDIOGRAM (TEE) N/A Monitor Anesthesia Care  CARDIOVERSION      Medications: Outpatient Encounter Medications as of 07/27/2022  Medication Sig   acetaminophen (TYLENOL) 325 MG tablet Take 1-2 tablets (325-650 mg total) by mouth every 4 (four) hours as needed for mild pain.   amLODipine (NORVASC) 10 MG tablet Take 1 tablet (10 mg total) by mouth daily.   atorvastatin (LIPITOR) 80 MG tablet TAKE 1 TABLET BY MOUTH EVERY DAY AT 6 PM   carbamazepine (CARBATROL) 300 MG 12 hr capsule TAKE 1 CAPSULE(300 MG) BY MOUTH TWICE DAILY (Patient taking differently: Take 300 mg by mouth 2 (two) times daily.)   cloNIDine (CATAPRES) 0.2 MG tablet TAKE 1 TABLET(0.2 MG) BY MOUTH TWICE DAILY   donepezil (ARICEPT) 5 MG tablet TAKE 1 TABLET(5 MG) BY MOUTH AT BEDTIME   escitalopram (LEXAPRO) 5 MG tablet TAKE 1 TABLET BY MOUTH EVERY DAY AT BEDTIME   furosemide (LASIX) 20 MG tablet TAKE 1/2 TABLET(10 MG) BY MOUTH DAILY   glucose blood test strip 1 each by Other route as needed. Use as instructed   glucose monitoring kit (FREESTYLE) monitoring kit 1 each by Does not apply route as needed.   HYDROcodone-acetaminophen (NORCO/VICODIN) 5-325 MG tablet Take 1 tablet by mouth every 6 (six) hours as needed.   isosorbide mononitrate (IMDUR) 60 MG 24 hr tablet TAKE 1/2 TABLET BY MOUTH EVERY DAY   Lancets (ACCU-CHEK SAFE-T PRO) lancets 1 each by Other route as needed. Use as instructed  levETIRAcetam (KEPPRA) 500 MG tablet Take 1 tablet (500 mg total) by mouth 2 (two) times daily.   levothyroxine (SYNTHROID) 88 MCG tablet TAKE 1 TABLET(88 MCG) BY MOUTH DAILY   losartan (COZAAR) 100 MG tablet TAKE 1/2 TABLET BY MOUTH EVERY DAY   Multiple Vitamins-Minerals (MULTIVITAMINS THER. W/MINERALS) TABS Take 1 tablet by mouth at bedtime.     MYRBETRIQ 50 MG TB24 tablet Take 50 mg by mouth daily.    predniSONE (DELTASONE) 20 MG tablet 3 tabs poqday 1-2, 2 tabs poqday 3-4, 1 tab poqday 5-6   rivaroxaban (XARELTO) 20 MG TABS tablet Take 1 tablet (20 mg total) by mouth daily with supper.   tamsulosin (FLOMAX) 0.4 MG CAPS capsule Take 1 capsule (0.4 mg total) by mouth daily after supper. TAKE 1 CAPSULE(0.4 MG) BY MOUTH DAILY AFTER AND SUPPER Strength: 0.4 mg   No facility-administered encounter medications on file as of 07/27/2022.    Recent Office Vitals: BP Readings from Last 3 Encounters:  05/30/22 (!) 163/79  05/26/22 127/66  05/10/22 (!) 172/70   Pulse Readings from Last 3 Encounters:  05/30/22 (!) 59  05/26/22 63  05/10/22 89    Wt Readings from Last 3 Encounters:  05/30/22 172 lb (78 kg)  05/26/22 172 lb (78 kg)  05/10/22 180 lb (81.6 kg)     Kidney Function Lab Results  Component Value Date/Time   CREATININE 0.86 05/30/2022 04:44 PM   CREATININE 1.30 (H) 05/26/2022 08:58 AM   CREATININE 1.14 03/29/2022 02:38 PM   CREATININE 1.19 05/08/2021 11:12 AM   GFRNONAA >60 05/30/2022 04:44 PM   GFRNONAA 65 07/15/2020 03:32 PM   GFRAA 76 07/15/2020 03:32 PM       Latest Ref Rng & Units 05/30/2022    4:44 PM 05/26/2022    8:58 AM 03/29/2022    2:38 PM  BMP  Glucose 70 - 99 mg/dL 161  096  045   BUN 8 - 23 mg/dL 20  25  17    Creatinine 0.61 - 1.24 mg/dL 4.09  8.11  9.14   BUN/Creat Ratio 6 - 22 (calc)   SEE NOTE:   Sodium 135 - 145 mmol/L 135  136  138   Potassium 3.5 - 5.1 mmol/L 3.9  4.1  3.9   Chloride 98 - 111 mmol/L 105  102  102   CO2 22 - 32 mmol/L 25   26   Calcium 8.9 - 10.3 mg/dL 8.5   8.8      Future Appointments  Date Time Provider Department Center  08/05/2022  3:45 PM Windell Norfolk, MD [facility] None  09/29/2022  7:05 AM [facility] DEVICE REMOTES [facility]  12/29/2022  7:05 AM [facility] DEVICE REMOTES [facility]  03/30/2023  7:05 AM [facility] DEVICE REMOTES [facility]  06/29/2023  7:05 AM [facility] DEVICE  REMOTES [facility]  09/28/2023  7:05 AM [facility] DEVICE REMOTES [facility]   April D Calhoun, [facility] Clinical Pharmacist Assistant [phone]

## 2022-07-29 ENCOUNTER — Telehealth: Payer: Self-pay

## 2022-07-29 DIAGNOSIS — I5032 Chronic diastolic (congestive) heart failure: Secondary | ICD-10-CM | POA: Diagnosis not present

## 2022-07-29 DIAGNOSIS — N400 Enlarged prostate without lower urinary tract symptoms: Secondary | ICD-10-CM | POA: Diagnosis not present

## 2022-07-29 DIAGNOSIS — I480 Paroxysmal atrial fibrillation: Secondary | ICD-10-CM | POA: Diagnosis not present

## 2022-07-29 DIAGNOSIS — I4892 Unspecified atrial flutter: Secondary | ICD-10-CM | POA: Diagnosis not present

## 2022-07-29 DIAGNOSIS — M5116 Intervertebral disc disorders with radiculopathy, lumbar region: Secondary | ICD-10-CM | POA: Diagnosis not present

## 2022-07-29 DIAGNOSIS — G8929 Other chronic pain: Secondary | ICD-10-CM | POA: Diagnosis not present

## 2022-07-29 DIAGNOSIS — I6523 Occlusion and stenosis of bilateral carotid arteries: Secondary | ICD-10-CM | POA: Diagnosis not present

## 2022-07-29 DIAGNOSIS — E785 Hyperlipidemia, unspecified: Secondary | ICD-10-CM | POA: Diagnosis not present

## 2022-07-29 DIAGNOSIS — E039 Hypothyroidism, unspecified: Secondary | ICD-10-CM | POA: Diagnosis not present

## 2022-07-29 DIAGNOSIS — H8109 Meniere's disease, unspecified ear: Secondary | ICD-10-CM | POA: Diagnosis not present

## 2022-07-29 DIAGNOSIS — I252 Old myocardial infarction: Secondary | ICD-10-CM | POA: Diagnosis not present

## 2022-07-29 DIAGNOSIS — M1990 Unspecified osteoarthritis, unspecified site: Secondary | ICD-10-CM | POA: Diagnosis not present

## 2022-07-29 DIAGNOSIS — G40909 Epilepsy, unspecified, not intractable, without status epilepticus: Secondary | ICD-10-CM | POA: Diagnosis not present

## 2022-07-29 DIAGNOSIS — I110 Hypertensive heart disease with heart failure: Secondary | ICD-10-CM | POA: Diagnosis not present

## 2022-07-29 DIAGNOSIS — I2510 Atherosclerotic heart disease of native coronary artery without angina pectoris: Secondary | ICD-10-CM | POA: Diagnosis not present

## 2022-07-29 DIAGNOSIS — E119 Type 2 diabetes mellitus without complications: Secondary | ICD-10-CM | POA: Diagnosis not present

## 2022-07-29 NOTE — Telephone Encounter (Signed)
Annette Physical Therapist with [facility] called in to make pcp aware that pt's bp was 200/92, also stated that pt denies any other symptoms. Pt stated that he has been out of his Amlodipine for a few days now. Drinda Butts stated that she did not attempt to provide PT because of pt's bp being elevated. Please advise.  Drinda Butts PT with [facility] [phone] Please call if there are any questions.  CB# [phone] Please contact pt or pt's family if there are any instructions for the family

## 2022-07-29 NOTE — Telephone Encounter (Signed)
Called spoke w/pt regarding PT's concern about high BP. Per pt stated that it's probably high, he has a lot of things going on plus he is out of his B/P meds now for a few days. However per pt, his son is able to pick up his bp meds for him today so he will take his b/p meds to try and keep it down. Pt is advise if bp is high again and if experiencing any symptoms then to go to UC or ED, pt voice understanding.   Nothing further.

## 2022-08-02 DIAGNOSIS — N400 Enlarged prostate without lower urinary tract symptoms: Secondary | ICD-10-CM | POA: Diagnosis not present

## 2022-08-02 DIAGNOSIS — G40909 Epilepsy, unspecified, not intractable, without status epilepticus: Secondary | ICD-10-CM | POA: Diagnosis not present

## 2022-08-02 DIAGNOSIS — E119 Type 2 diabetes mellitus without complications: Secondary | ICD-10-CM | POA: Diagnosis not present

## 2022-08-02 DIAGNOSIS — I4892 Unspecified atrial flutter: Secondary | ICD-10-CM | POA: Diagnosis not present

## 2022-08-02 DIAGNOSIS — E785 Hyperlipidemia, unspecified: Secondary | ICD-10-CM | POA: Diagnosis not present

## 2022-08-02 DIAGNOSIS — I252 Old myocardial infarction: Secondary | ICD-10-CM | POA: Diagnosis not present

## 2022-08-02 DIAGNOSIS — H8109 Meniere's disease, unspecified ear: Secondary | ICD-10-CM | POA: Diagnosis not present

## 2022-08-02 DIAGNOSIS — M1990 Unspecified osteoarthritis, unspecified site: Secondary | ICD-10-CM | POA: Diagnosis not present

## 2022-08-02 DIAGNOSIS — I6523 Occlusion and stenosis of bilateral carotid arteries: Secondary | ICD-10-CM | POA: Diagnosis not present

## 2022-08-02 DIAGNOSIS — I480 Paroxysmal atrial fibrillation: Secondary | ICD-10-CM | POA: Diagnosis not present

## 2022-08-02 DIAGNOSIS — M5116 Intervertebral disc disorders with radiculopathy, lumbar region: Secondary | ICD-10-CM | POA: Diagnosis not present

## 2022-08-02 DIAGNOSIS — I5032 Chronic diastolic (congestive) heart failure: Secondary | ICD-10-CM | POA: Diagnosis not present

## 2022-08-02 DIAGNOSIS — G8929 Other chronic pain: Secondary | ICD-10-CM | POA: Diagnosis not present

## 2022-08-02 DIAGNOSIS — E039 Hypothyroidism, unspecified: Secondary | ICD-10-CM | POA: Diagnosis not present

## 2022-08-02 DIAGNOSIS — I2510 Atherosclerotic heart disease of native coronary artery without angina pectoris: Secondary | ICD-10-CM | POA: Diagnosis not present

## 2022-08-02 DIAGNOSIS — I110 Hypertensive heart disease with heart failure: Secondary | ICD-10-CM | POA: Diagnosis not present

## 2022-08-03 DIAGNOSIS — M1711 Unilateral primary osteoarthritis, right knee: Secondary | ICD-10-CM | POA: Diagnosis not present

## 2022-08-04 DIAGNOSIS — E119 Type 2 diabetes mellitus without complications: Secondary | ICD-10-CM | POA: Diagnosis not present

## 2022-08-04 DIAGNOSIS — I252 Old myocardial infarction: Secondary | ICD-10-CM | POA: Diagnosis not present

## 2022-08-04 DIAGNOSIS — G8929 Other chronic pain: Secondary | ICD-10-CM | POA: Diagnosis not present

## 2022-08-04 DIAGNOSIS — I2510 Atherosclerotic heart disease of native coronary artery without angina pectoris: Secondary | ICD-10-CM | POA: Diagnosis not present

## 2022-08-04 DIAGNOSIS — I5032 Chronic diastolic (congestive) heart failure: Secondary | ICD-10-CM | POA: Diagnosis not present

## 2022-08-04 DIAGNOSIS — I4892 Unspecified atrial flutter: Secondary | ICD-10-CM | POA: Diagnosis not present

## 2022-08-04 DIAGNOSIS — N400 Enlarged prostate without lower urinary tract symptoms: Secondary | ICD-10-CM | POA: Diagnosis not present

## 2022-08-04 DIAGNOSIS — E785 Hyperlipidemia, unspecified: Secondary | ICD-10-CM | POA: Diagnosis not present

## 2022-08-04 DIAGNOSIS — G40909 Epilepsy, unspecified, not intractable, without status epilepticus: Secondary | ICD-10-CM | POA: Diagnosis not present

## 2022-08-04 DIAGNOSIS — E039 Hypothyroidism, unspecified: Secondary | ICD-10-CM | POA: Diagnosis not present

## 2022-08-04 DIAGNOSIS — M5116 Intervertebral disc disorders with radiculopathy, lumbar region: Secondary | ICD-10-CM | POA: Diagnosis not present

## 2022-08-04 DIAGNOSIS — H8109 Meniere's disease, unspecified ear: Secondary | ICD-10-CM | POA: Diagnosis not present

## 2022-08-04 DIAGNOSIS — I480 Paroxysmal atrial fibrillation: Secondary | ICD-10-CM | POA: Diagnosis not present

## 2022-08-04 DIAGNOSIS — M1990 Unspecified osteoarthritis, unspecified site: Secondary | ICD-10-CM | POA: Diagnosis not present

## 2022-08-04 DIAGNOSIS — Z87891 Personal history of nicotine dependence: Secondary | ICD-10-CM | POA: Diagnosis not present

## 2022-08-04 DIAGNOSIS — I110 Hypertensive heart disease with heart failure: Secondary | ICD-10-CM | POA: Diagnosis not present

## 2022-08-05 ENCOUNTER — Ambulatory Visit: Payer: Medicare Other | Admitting: Neurology

## 2022-08-06 NOTE — Progress Notes (Signed)
Remote pacemaker transmission.   

## 2022-08-10 DIAGNOSIS — H8109 Meniere's disease, unspecified ear: Secondary | ICD-10-CM | POA: Diagnosis not present

## 2022-08-10 DIAGNOSIS — Z87891 Personal history of nicotine dependence: Secondary | ICD-10-CM | POA: Diagnosis not present

## 2022-08-10 DIAGNOSIS — N400 Enlarged prostate without lower urinary tract symptoms: Secondary | ICD-10-CM | POA: Diagnosis not present

## 2022-08-10 DIAGNOSIS — I480 Paroxysmal atrial fibrillation: Secondary | ICD-10-CM | POA: Diagnosis not present

## 2022-08-10 DIAGNOSIS — M1711 Unilateral primary osteoarthritis, right knee: Secondary | ICD-10-CM | POA: Diagnosis not present

## 2022-08-10 DIAGNOSIS — I5032 Chronic diastolic (congestive) heart failure: Secondary | ICD-10-CM | POA: Diagnosis not present

## 2022-08-10 DIAGNOSIS — G8929 Other chronic pain: Secondary | ICD-10-CM | POA: Diagnosis not present

## 2022-08-10 DIAGNOSIS — I252 Old myocardial infarction: Secondary | ICD-10-CM | POA: Diagnosis not present

## 2022-08-10 DIAGNOSIS — E785 Hyperlipidemia, unspecified: Secondary | ICD-10-CM | POA: Diagnosis not present

## 2022-08-10 DIAGNOSIS — E039 Hypothyroidism, unspecified: Secondary | ICD-10-CM | POA: Diagnosis not present

## 2022-08-10 DIAGNOSIS — I110 Hypertensive heart disease with heart failure: Secondary | ICD-10-CM | POA: Diagnosis not present

## 2022-08-10 DIAGNOSIS — E119 Type 2 diabetes mellitus without complications: Secondary | ICD-10-CM | POA: Diagnosis not present

## 2022-08-10 DIAGNOSIS — I2510 Atherosclerotic heart disease of native coronary artery without angina pectoris: Secondary | ICD-10-CM | POA: Diagnosis not present

## 2022-08-10 DIAGNOSIS — M1990 Unspecified osteoarthritis, unspecified site: Secondary | ICD-10-CM | POA: Diagnosis not present

## 2022-08-10 DIAGNOSIS — I4892 Unspecified atrial flutter: Secondary | ICD-10-CM | POA: Diagnosis not present

## 2022-08-10 DIAGNOSIS — M5116 Intervertebral disc disorders with radiculopathy, lumbar region: Secondary | ICD-10-CM | POA: Diagnosis not present

## 2022-08-10 DIAGNOSIS — G40909 Epilepsy, unspecified, not intractable, without status epilepticus: Secondary | ICD-10-CM | POA: Diagnosis not present

## 2022-08-18 ENCOUNTER — Other Ambulatory Visit: Payer: Self-pay | Admitting: Family Medicine

## 2022-08-18 NOTE — Telephone Encounter (Signed)
Requested Prescriptions  Pending Prescriptions Disp Refills   furosemide (LASIX) 20 MG tablet [Pharmacy Med Name: FUROSEMIDE 20MG  TABLETS] 45 tablet 1    Sig: TAKE 1/2 TABLET(10 MG) BY MOUTH DAILY     Cardiovascular:  Diuretics - Loop Failed - 08/18/2022  6:35 AM      Failed - Ca in normal range and within 180 days    Calcium  Date Value Ref Range Status  05/30/2022 8.5 (L) 8.9 - 10.3 mg/dL Final   Calcium, Ion  Date Value Ref Range Status  05/26/2022 1.12 (L) 1.15 - 1.40 mmol/L Final         Failed - Mg Level in normal range and within 180 days    Magnesium  Date Value Ref Range Status  01/05/2022 2.6 (H) 1.7 - 2.4 mg/dL Final    Comment:    Performed at [facility], [address], [place], [place] [address]         Failed - Last BP in normal range    BP Readings from Last 1 Encounters:  05/30/22 (!) 163/79         Failed - Valid encounter within last 6 months    Recent Outpatient Visits           1 year ago Essential hypertension   [facility] Donita Brooks, MD   1 year ago Hematuria, unspecified type   [facility] Donita Brooks, MD   1 year ago Paroxysmal atrial fibrillation (HCC)   [facility] Donita Brooks, MD   2 years ago Paroxysmal atrial fibrillation (HCC)   [facility] Donita Brooks, MD   2 years ago Herpes zoster without complication   [facility] Valentino Nose, NP              Passed - K in normal range and within 180 days    Potassium  Date Value Ref Range Status  05/30/2022 3.9 3.5 - 5.1 mmol/L Final         Passed - Na in normal range and within 180 days    Sodium  Date Value Ref Range Status  05/30/2022 135 135 - 145 mmol/L Final  01/28/2022 139 134 - 144 mmol/L Final         Passed - Cr in normal range and within 180 days    Creat  Date Value Ref Range Status  03/29/2022 1.14 0.70 - 1.22 mg/dL Final   Creatinine, Ser  Date  Value Ref Range Status  05/30/2022 0.86 0.61 - 1.24 mg/dL Final         Passed - Cl in normal range and within 180 days    Chloride  Date Value Ref Range Status  05/30/2022 105 98 - 111 mmol/L Final

## 2022-08-19 DIAGNOSIS — I110 Hypertensive heart disease with heart failure: Secondary | ICD-10-CM | POA: Diagnosis not present

## 2022-08-19 DIAGNOSIS — M1990 Unspecified osteoarthritis, unspecified site: Secondary | ICD-10-CM | POA: Diagnosis not present

## 2022-08-19 DIAGNOSIS — N400 Enlarged prostate without lower urinary tract symptoms: Secondary | ICD-10-CM | POA: Diagnosis not present

## 2022-08-19 DIAGNOSIS — H8109 Meniere's disease, unspecified ear: Secondary | ICD-10-CM | POA: Diagnosis not present

## 2022-08-19 DIAGNOSIS — M5116 Intervertebral disc disorders with radiculopathy, lumbar region: Secondary | ICD-10-CM | POA: Diagnosis not present

## 2022-08-19 DIAGNOSIS — I5032 Chronic diastolic (congestive) heart failure: Secondary | ICD-10-CM | POA: Diagnosis not present

## 2022-08-19 DIAGNOSIS — I2510 Atherosclerotic heart disease of native coronary artery without angina pectoris: Secondary | ICD-10-CM | POA: Diagnosis not present

## 2022-08-19 DIAGNOSIS — E785 Hyperlipidemia, unspecified: Secondary | ICD-10-CM | POA: Diagnosis not present

## 2022-08-19 DIAGNOSIS — G8929 Other chronic pain: Secondary | ICD-10-CM | POA: Diagnosis not present

## 2022-08-19 DIAGNOSIS — I252 Old myocardial infarction: Secondary | ICD-10-CM | POA: Diagnosis not present

## 2022-08-19 DIAGNOSIS — G40909 Epilepsy, unspecified, not intractable, without status epilepticus: Secondary | ICD-10-CM | POA: Diagnosis not present

## 2022-08-19 DIAGNOSIS — I4892 Unspecified atrial flutter: Secondary | ICD-10-CM | POA: Diagnosis not present

## 2022-08-19 DIAGNOSIS — I480 Paroxysmal atrial fibrillation: Secondary | ICD-10-CM | POA: Diagnosis not present

## 2022-08-19 DIAGNOSIS — E039 Hypothyroidism, unspecified: Secondary | ICD-10-CM | POA: Diagnosis not present

## 2022-08-19 DIAGNOSIS — E119 Type 2 diabetes mellitus without complications: Secondary | ICD-10-CM | POA: Diagnosis not present

## 2022-08-19 DIAGNOSIS — Z87891 Personal history of nicotine dependence: Secondary | ICD-10-CM | POA: Diagnosis not present

## 2022-08-23 ENCOUNTER — Other Ambulatory Visit: Payer: Self-pay | Admitting: Family Medicine

## 2022-08-26 DIAGNOSIS — I4892 Unspecified atrial flutter: Secondary | ICD-10-CM | POA: Diagnosis not present

## 2022-08-26 DIAGNOSIS — N400 Enlarged prostate without lower urinary tract symptoms: Secondary | ICD-10-CM | POA: Diagnosis not present

## 2022-08-26 DIAGNOSIS — I2510 Atherosclerotic heart disease of native coronary artery without angina pectoris: Secondary | ICD-10-CM | POA: Diagnosis not present

## 2022-08-26 DIAGNOSIS — G8929 Other chronic pain: Secondary | ICD-10-CM | POA: Diagnosis not present

## 2022-08-26 DIAGNOSIS — E039 Hypothyroidism, unspecified: Secondary | ICD-10-CM | POA: Diagnosis not present

## 2022-08-26 DIAGNOSIS — I252 Old myocardial infarction: Secondary | ICD-10-CM | POA: Diagnosis not present

## 2022-08-26 DIAGNOSIS — M5116 Intervertebral disc disorders with radiculopathy, lumbar region: Secondary | ICD-10-CM | POA: Diagnosis not present

## 2022-08-26 DIAGNOSIS — Z87891 Personal history of nicotine dependence: Secondary | ICD-10-CM | POA: Diagnosis not present

## 2022-08-26 DIAGNOSIS — G40909 Epilepsy, unspecified, not intractable, without status epilepticus: Secondary | ICD-10-CM | POA: Diagnosis not present

## 2022-08-26 DIAGNOSIS — H8109 Meniere's disease, unspecified ear: Secondary | ICD-10-CM | POA: Diagnosis not present

## 2022-08-26 DIAGNOSIS — I5032 Chronic diastolic (congestive) heart failure: Secondary | ICD-10-CM | POA: Diagnosis not present

## 2022-08-26 DIAGNOSIS — M1990 Unspecified osteoarthritis, unspecified site: Secondary | ICD-10-CM | POA: Diagnosis not present

## 2022-08-26 DIAGNOSIS — E785 Hyperlipidemia, unspecified: Secondary | ICD-10-CM | POA: Diagnosis not present

## 2022-08-26 DIAGNOSIS — I110 Hypertensive heart disease with heart failure: Secondary | ICD-10-CM | POA: Diagnosis not present

## 2022-08-26 DIAGNOSIS — I480 Paroxysmal atrial fibrillation: Secondary | ICD-10-CM | POA: Diagnosis not present

## 2022-08-26 DIAGNOSIS — E119 Type 2 diabetes mellitus without complications: Secondary | ICD-10-CM | POA: Diagnosis not present

## 2022-08-30 DIAGNOSIS — I5032 Chronic diastolic (congestive) heart failure: Secondary | ICD-10-CM | POA: Diagnosis not present

## 2022-08-30 DIAGNOSIS — N400 Enlarged prostate without lower urinary tract symptoms: Secondary | ICD-10-CM | POA: Diagnosis not present

## 2022-08-30 DIAGNOSIS — G8929 Other chronic pain: Secondary | ICD-10-CM | POA: Diagnosis not present

## 2022-08-30 DIAGNOSIS — E785 Hyperlipidemia, unspecified: Secondary | ICD-10-CM | POA: Diagnosis not present

## 2022-08-30 DIAGNOSIS — I480 Paroxysmal atrial fibrillation: Secondary | ICD-10-CM | POA: Diagnosis not present

## 2022-08-30 DIAGNOSIS — H8109 Meniere's disease, unspecified ear: Secondary | ICD-10-CM | POA: Diagnosis not present

## 2022-08-30 DIAGNOSIS — I4892 Unspecified atrial flutter: Secondary | ICD-10-CM | POA: Diagnosis not present

## 2022-08-30 DIAGNOSIS — Z87891 Personal history of nicotine dependence: Secondary | ICD-10-CM | POA: Diagnosis not present

## 2022-08-30 DIAGNOSIS — I110 Hypertensive heart disease with heart failure: Secondary | ICD-10-CM | POA: Diagnosis not present

## 2022-08-30 DIAGNOSIS — I252 Old myocardial infarction: Secondary | ICD-10-CM | POA: Diagnosis not present

## 2022-08-30 DIAGNOSIS — G40909 Epilepsy, unspecified, not intractable, without status epilepticus: Secondary | ICD-10-CM | POA: Diagnosis not present

## 2022-08-30 DIAGNOSIS — E039 Hypothyroidism, unspecified: Secondary | ICD-10-CM | POA: Diagnosis not present

## 2022-08-30 DIAGNOSIS — E119 Type 2 diabetes mellitus without complications: Secondary | ICD-10-CM | POA: Diagnosis not present

## 2022-08-30 DIAGNOSIS — I2510 Atherosclerotic heart disease of native coronary artery without angina pectoris: Secondary | ICD-10-CM | POA: Diagnosis not present

## 2022-08-30 DIAGNOSIS — M5116 Intervertebral disc disorders with radiculopathy, lumbar region: Secondary | ICD-10-CM | POA: Diagnosis not present

## 2022-08-30 DIAGNOSIS — M1990 Unspecified osteoarthritis, unspecified site: Secondary | ICD-10-CM | POA: Diagnosis not present

## 2022-08-31 ENCOUNTER — Other Ambulatory Visit: Payer: Self-pay | Admitting: Family Medicine

## 2022-09-01 NOTE — Telephone Encounter (Signed)
Unable to refill per protocol, Rx request is too soon.   Requested Prescriptions  Pending Prescriptions Disp Refills   furosemide (LASIX) 20 MG tablet [Pharmacy Med Name: FUROSEMIDE 20MG  TABLETS] 45 tablet 1    Sig: TAKE 1/2 TABLET(10 MG) BY MOUTH DAILY     Cardiovascular:  Diuretics - Loop Failed - 08/31/2022 12:30 PM      Failed - Ca in normal range and within 180 days    Calcium  Date Value Ref Range Status  05/30/2022 8.5 (L) 8.9 - 10.3 mg/dL Final   Calcium, Ion  Date Value Ref Range Status  05/26/2022 1.12 (L) 1.15 - 1.40 mmol/L Final         Failed - Mg Level in normal range and within 180 days    Magnesium  Date Value Ref Range Status  01/05/2022 2.6 (H) 1.7 - 2.4 mg/dL Final    Comment:    Performed at [facility], [address], [place], [place] [address]         Failed - Last BP in normal range    BP Readings from Last 1 Encounters:  05/30/22 (!) 163/79         Failed - Valid encounter within last 6 months    Recent Outpatient Visits           1 year ago Essential hypertension   [facility] Donita Brooks, MD   1 year ago Hematuria, unspecified type   [facility] Donita Brooks, MD   2 years ago Paroxysmal atrial fibrillation (HCC)   [facility] Donita Brooks, MD   2 years ago Paroxysmal atrial fibrillation (HCC)   [facility] Donita Brooks, MD   2 years ago Herpes zoster without complication   [facility] Valentino Nose, NP              Passed - K in normal range and within 180 days    Potassium  Date Value Ref Range Status  05/30/2022 3.9 3.5 - 5.1 mmol/L Final         Passed - Na in normal range and within 180 days    Sodium  Date Value Ref Range Status  05/30/2022 135 135 - 145 mmol/L Final  01/28/2022 139 134 - 144 mmol/L Final         Passed - Cr in normal range and within 180 days    Creat  Date Value Ref Range Status  03/29/2022  1.14 0.70 - 1.22 mg/dL Final   Creatinine, Ser  Date Value Ref Range Status  05/30/2022 0.86 0.61 - 1.24 mg/dL Final         Passed - Cl in normal range and within 180 days    Chloride  Date Value Ref Range Status  05/30/2022 105 98 - 111 mmol/L Final          losartan (COZAAR) 100 MG tablet [Pharmacy Med Name: LOSARTAN 100MG  TABLETS] 90 tablet 3    Sig: TAKE 1/2 TABLET BY MOUTH EVERY DAY     Cardiovascular:  Angiotensin Receptor Blockers Failed - 08/31/2022 12:30 PM      Failed - Last BP in normal range    BP Readings from Last 1 Encounters:  05/30/22 (!) 163/79         Failed - Valid encounter within last 6 months    Recent Outpatient Visits  1 year ago Essential hypertension   [facility] Pickard, Priscille Heidelberg, MD   1 year ago Hematuria, unspecified type   [facility] Donita Brooks, MD   2 years ago Paroxysmal atrial fibrillation [facility])   [facility] Donita Brooks, MD   2 years ago Paroxysmal atrial fibrillation [facility])   [facility] Donita Brooks, MD   2 years ago Herpes zoster without complication   [facility] Valentino Nose, NP              Passed - Cr in normal range and within 180 days    Creat  Date Value Ref Range Status  03/29/2022 1.14 0.70 - 1.22 mg/dL Final   Creatinine, Ser  Date Value Ref Range Status  05/30/2022 0.86 0.61 - 1.24 mg/dL Final         Passed - K in normal range and within 180 days    Potassium  Date Value Ref Range Status  05/30/2022 3.9 3.5 - 5.1 mmol/L Final         Passed - Patient is not pregnant

## 2022-09-03 DIAGNOSIS — G8929 Other chronic pain: Secondary | ICD-10-CM | POA: Diagnosis not present

## 2022-09-03 DIAGNOSIS — I4892 Unspecified atrial flutter: Secondary | ICD-10-CM | POA: Diagnosis not present

## 2022-09-03 DIAGNOSIS — I480 Paroxysmal atrial fibrillation: Secondary | ICD-10-CM | POA: Diagnosis not present

## 2022-09-03 DIAGNOSIS — H8109 Meniere's disease, unspecified ear: Secondary | ICD-10-CM | POA: Diagnosis not present

## 2022-09-03 DIAGNOSIS — E785 Hyperlipidemia, unspecified: Secondary | ICD-10-CM | POA: Diagnosis not present

## 2022-09-03 DIAGNOSIS — M1990 Unspecified osteoarthritis, unspecified site: Secondary | ICD-10-CM | POA: Diagnosis not present

## 2022-09-03 DIAGNOSIS — G40909 Epilepsy, unspecified, not intractable, without status epilepticus: Secondary | ICD-10-CM | POA: Diagnosis not present

## 2022-09-03 DIAGNOSIS — I5032 Chronic diastolic (congestive) heart failure: Secondary | ICD-10-CM | POA: Diagnosis not present

## 2022-09-03 DIAGNOSIS — I252 Old myocardial infarction: Secondary | ICD-10-CM | POA: Diagnosis not present

## 2022-09-03 DIAGNOSIS — E039 Hypothyroidism, unspecified: Secondary | ICD-10-CM | POA: Diagnosis not present

## 2022-09-03 DIAGNOSIS — Z87891 Personal history of nicotine dependence: Secondary | ICD-10-CM | POA: Diagnosis not present

## 2022-09-03 DIAGNOSIS — I2510 Atherosclerotic heart disease of native coronary artery without angina pectoris: Secondary | ICD-10-CM | POA: Diagnosis not present

## 2022-09-03 DIAGNOSIS — M5116 Intervertebral disc disorders with radiculopathy, lumbar region: Secondary | ICD-10-CM | POA: Diagnosis not present

## 2022-09-03 DIAGNOSIS — N400 Enlarged prostate without lower urinary tract symptoms: Secondary | ICD-10-CM | POA: Diagnosis not present

## 2022-09-03 DIAGNOSIS — I110 Hypertensive heart disease with heart failure: Secondary | ICD-10-CM | POA: Diagnosis not present

## 2022-09-03 DIAGNOSIS — E119 Type 2 diabetes mellitus without complications: Secondary | ICD-10-CM | POA: Diagnosis not present

## 2022-09-08 ENCOUNTER — Other Ambulatory Visit: Payer: Self-pay | Admitting: Family Medicine

## 2022-09-08 NOTE — Telephone Encounter (Signed)
Unable to refill per protocol, Rx request is too soon. Last refill 05/12/22 for 90 and 1 refill. Duplicate request.  Requested Prescriptions  Pending Prescriptions Disp Refills   furosemide (LASIX) 20 MG tablet [Pharmacy Med Name: FUROSEMIDE 20MG  TABLETS] 45 tablet 1    Sig: TAKE 1/2 TABLET(10 MG) BY MOUTH DAILY     Cardiovascular:  Diuretics - Loop Failed - 09/08/2022 10:57 AM      Failed - Ca in normal range and within 180 days    Calcium  Date Value Ref Range Status  05/30/2022 8.5 (L) 8.9 - 10.3 mg/dL Final   Calcium, Ion  Date Value Ref Range Status  05/26/2022 1.12 (L) 1.15 - 1.40 mmol/L Final         Failed - Mg Level in normal range and within 180 days    Magnesium  Date Value Ref Range Status  01/05/2022 2.6 (H) 1.7 - 2.4 mg/dL Final    Comment:    Performed at [facility], [address] [place], [place] [address]         Failed - Last BP in normal range    BP Readings from Last 1 Encounters:  05/30/22 (!) 163/79         Failed - Valid encounter within last 6 months    Recent Outpatient Visits           1 year ago Essential hypertension   [facility] Donita Brooks, MD   1 year ago Hematuria, unspecified type   [facility] Donita Brooks, MD   2 years ago Paroxysmal atrial fibrillation (HCC)   [facility] Donita Brooks, MD   2 years ago Paroxysmal atrial fibrillation (HCC)   [facility] Donita Brooks, MD   2 years ago Herpes zoster without complication   [facility] Valentino Nose, NP              Passed - K in normal range and within 180 days    Potassium  Date Value Ref Range Status  05/30/2022 3.9 3.5 - 5.1 mmol/L Final         Passed - Na in normal range and within 180 days    Sodium  Date Value Ref Range Status  05/30/2022 135 135 - 145 mmol/L Final  01/28/2022 139 134 - 144 mmol/L Final         Passed - Cr in normal range and within  180 days    Creat  Date Value Ref Range Status  03/29/2022 1.14 0.70 - 1.22 mg/dL Final   Creatinine, Ser  Date Value Ref Range Status  05/30/2022 0.86 0.61 - 1.24 mg/dL Final         Passed - Cl in normal range and within 180 days    Chloride  Date Value Ref Range Status  05/30/2022 105 98 - 111 mmol/L Final

## 2022-09-09 DIAGNOSIS — I5032 Chronic diastolic (congestive) heart failure: Secondary | ICD-10-CM | POA: Diagnosis not present

## 2022-09-09 DIAGNOSIS — M5116 Intervertebral disc disorders with radiculopathy, lumbar region: Secondary | ICD-10-CM | POA: Diagnosis not present

## 2022-09-09 DIAGNOSIS — H8109 Meniere's disease, unspecified ear: Secondary | ICD-10-CM | POA: Diagnosis not present

## 2022-09-09 DIAGNOSIS — I4892 Unspecified atrial flutter: Secondary | ICD-10-CM | POA: Diagnosis not present

## 2022-09-09 DIAGNOSIS — G40909 Epilepsy, unspecified, not intractable, without status epilepticus: Secondary | ICD-10-CM | POA: Diagnosis not present

## 2022-09-09 DIAGNOSIS — I2510 Atherosclerotic heart disease of native coronary artery without angina pectoris: Secondary | ICD-10-CM | POA: Diagnosis not present

## 2022-09-09 DIAGNOSIS — I110 Hypertensive heart disease with heart failure: Secondary | ICD-10-CM | POA: Diagnosis not present

## 2022-09-09 DIAGNOSIS — M1990 Unspecified osteoarthritis, unspecified site: Secondary | ICD-10-CM | POA: Diagnosis not present

## 2022-09-09 DIAGNOSIS — E039 Hypothyroidism, unspecified: Secondary | ICD-10-CM | POA: Diagnosis not present

## 2022-09-09 DIAGNOSIS — I252 Old myocardial infarction: Secondary | ICD-10-CM | POA: Diagnosis not present

## 2022-09-09 DIAGNOSIS — E785 Hyperlipidemia, unspecified: Secondary | ICD-10-CM | POA: Diagnosis not present

## 2022-09-09 DIAGNOSIS — N400 Enlarged prostate without lower urinary tract symptoms: Secondary | ICD-10-CM | POA: Diagnosis not present

## 2022-09-09 DIAGNOSIS — Z87891 Personal history of nicotine dependence: Secondary | ICD-10-CM | POA: Diagnosis not present

## 2022-09-09 DIAGNOSIS — E119 Type 2 diabetes mellitus without complications: Secondary | ICD-10-CM | POA: Diagnosis not present

## 2022-09-09 DIAGNOSIS — I480 Paroxysmal atrial fibrillation: Secondary | ICD-10-CM | POA: Diagnosis not present

## 2022-09-09 DIAGNOSIS — G8929 Other chronic pain: Secondary | ICD-10-CM | POA: Diagnosis not present

## 2022-09-14 ENCOUNTER — Other Ambulatory Visit: Payer: Self-pay | Admitting: Family Medicine

## 2022-09-15 NOTE — Telephone Encounter (Signed)
Unable to refill per protocol, Rx request is too soon.Last refill 05/12/22 for 90 and 1 refill.  Requested Prescriptions  Pending Prescriptions Disp Refills   furosemide (LASIX) 20 MG tablet [Pharmacy Med Name: FUROSEMIDE 20MG  TABLETS] 45 tablet 1    Sig: TAKE 1/2 TABLET(10 MG) BY MOUTH DAILY     Cardiovascular:  Diuretics - Loop Failed - 09/14/2022  2:26 PM      Failed - Ca in normal range and within 180 days    Calcium  Date Value Ref Range Status  05/30/2022 8.5 (L) 8.9 - 10.3 mg/dL Final   Calcium, Ion  Date Value Ref Range Status  05/26/2022 1.12 (L) 1.15 - 1.40 mmol/L Final         Failed - Mg Level in normal range and within 180 days    Magnesium  Date Value Ref Range Status  01/05/2022 2.6 (H) 1.7 - 2.4 mg/dL Final    Comment:    Performed at [facility], [address] [place], [place] [address]         Failed - Last BP in normal range    BP Readings from Last 1 Encounters:  05/30/22 (!) 163/79         Failed - Valid encounter within last 6 months    Recent Outpatient Visits           1 year ago Essential hypertension   [facility] Donita Brooks, MD   1 year ago Hematuria, unspecified type   [facility] Donita Brooks, MD   2 years ago Paroxysmal atrial fibrillation (HCC)   [facility] Donita Brooks, MD   2 years ago Paroxysmal atrial fibrillation (HCC)   [facility] Donita Brooks, MD   2 years ago Herpes zoster without complication   [facility] Valentino Nose, NP       Future Appointments             In 3 months Rollene Rotunda, MD [facility] at [facility] - K in normal range and within 180 days    Potassium  Date Value Ref Range Status  05/30/2022 3.9 3.5 - 5.1 mmol/L Final         Passed - Na in normal range and within 180 days    Sodium  Date Value Ref Range Status  05/30/2022 135 135 - 145 mmol/L  Final  01/28/2022 139 134 - 144 mmol/L Final         Passed - Cr in normal range and within 180 days    Creat  Date Value Ref Range Status  03/29/2022 1.14 0.70 - 1.22 mg/dL Final   Creatinine, Ser  Date Value Ref Range Status  05/30/2022 0.86 0.61 - 1.24 mg/dL Final         Passed - Cl in normal range and within 180 days    Chloride  Date Value Ref Range Status  05/30/2022 105 98 - 111 mmol/L Final

## 2022-09-16 ENCOUNTER — Telehealth: Payer: Self-pay

## 2022-09-16 DIAGNOSIS — N400 Enlarged prostate without lower urinary tract symptoms: Secondary | ICD-10-CM | POA: Diagnosis not present

## 2022-09-16 DIAGNOSIS — I480 Paroxysmal atrial fibrillation: Secondary | ICD-10-CM | POA: Diagnosis not present

## 2022-09-16 DIAGNOSIS — E039 Hypothyroidism, unspecified: Secondary | ICD-10-CM | POA: Diagnosis not present

## 2022-09-16 DIAGNOSIS — E785 Hyperlipidemia, unspecified: Secondary | ICD-10-CM | POA: Diagnosis not present

## 2022-09-16 DIAGNOSIS — M1990 Unspecified osteoarthritis, unspecified site: Secondary | ICD-10-CM | POA: Diagnosis not present

## 2022-09-16 DIAGNOSIS — E119 Type 2 diabetes mellitus without complications: Secondary | ICD-10-CM | POA: Diagnosis not present

## 2022-09-16 DIAGNOSIS — I5032 Chronic diastolic (congestive) heart failure: Secondary | ICD-10-CM | POA: Diagnosis not present

## 2022-09-16 DIAGNOSIS — I110 Hypertensive heart disease with heart failure: Secondary | ICD-10-CM | POA: Diagnosis not present

## 2022-09-16 DIAGNOSIS — I252 Old myocardial infarction: Secondary | ICD-10-CM | POA: Diagnosis not present

## 2022-09-16 DIAGNOSIS — H8109 Meniere's disease, unspecified ear: Secondary | ICD-10-CM | POA: Diagnosis not present

## 2022-09-16 DIAGNOSIS — I2510 Atherosclerotic heart disease of native coronary artery without angina pectoris: Secondary | ICD-10-CM | POA: Diagnosis not present

## 2022-09-16 DIAGNOSIS — M5116 Intervertebral disc disorders with radiculopathy, lumbar region: Secondary | ICD-10-CM | POA: Diagnosis not present

## 2022-09-16 DIAGNOSIS — Z87891 Personal history of nicotine dependence: Secondary | ICD-10-CM | POA: Diagnosis not present

## 2022-09-16 DIAGNOSIS — G8929 Other chronic pain: Secondary | ICD-10-CM | POA: Diagnosis not present

## 2022-09-16 DIAGNOSIS — I4892 Unspecified atrial flutter: Secondary | ICD-10-CM | POA: Diagnosis not present

## 2022-09-16 DIAGNOSIS — G40909 Epilepsy, unspecified, not intractable, without status epilepticus: Secondary | ICD-10-CM | POA: Diagnosis not present

## 2022-09-16 NOTE — Telephone Encounter (Signed)
Annette from [facility] called in to let pcp/nurse know that pt has been without this med furosemide (LASIX) 20 MG tablet [[id number]] for 2 days. Wife is asking if this med could be called in for this pt please.   LOV: (MyChart visit) 06/03/22  Pharmacy: [facility] [id number] - [place], [place] - [address] AT [facility] [address], [place] [address] Phone: [phone]  Fax: [phone]    Cb#: [phone]

## 2022-09-17 ENCOUNTER — Other Ambulatory Visit: Payer: Self-pay

## 2022-09-17 DIAGNOSIS — I10 Essential (primary) hypertension: Secondary | ICD-10-CM

## 2022-09-17 DIAGNOSIS — I5031 Acute diastolic (congestive) heart failure: Secondary | ICD-10-CM

## 2022-09-17 MED ORDER — FUROSEMIDE 20 MG PO TABS
ORAL_TABLET | ORAL | 3 refills | Status: DC
Start: 2022-09-17 — End: 2023-06-06

## 2022-09-24 DIAGNOSIS — I480 Paroxysmal atrial fibrillation: Secondary | ICD-10-CM | POA: Diagnosis not present

## 2022-09-24 DIAGNOSIS — E119 Type 2 diabetes mellitus without complications: Secondary | ICD-10-CM | POA: Diagnosis not present

## 2022-09-24 DIAGNOSIS — M5116 Intervertebral disc disorders with radiculopathy, lumbar region: Secondary | ICD-10-CM | POA: Diagnosis not present

## 2022-09-24 DIAGNOSIS — M1990 Unspecified osteoarthritis, unspecified site: Secondary | ICD-10-CM | POA: Diagnosis not present

## 2022-09-24 DIAGNOSIS — E785 Hyperlipidemia, unspecified: Secondary | ICD-10-CM | POA: Diagnosis not present

## 2022-09-24 DIAGNOSIS — E039 Hypothyroidism, unspecified: Secondary | ICD-10-CM | POA: Diagnosis not present

## 2022-09-24 DIAGNOSIS — I5032 Chronic diastolic (congestive) heart failure: Secondary | ICD-10-CM | POA: Diagnosis not present

## 2022-09-24 DIAGNOSIS — I2510 Atherosclerotic heart disease of native coronary artery without angina pectoris: Secondary | ICD-10-CM | POA: Diagnosis not present

## 2022-09-24 DIAGNOSIS — I110 Hypertensive heart disease with heart failure: Secondary | ICD-10-CM | POA: Diagnosis not present

## 2022-09-24 DIAGNOSIS — N400 Enlarged prostate without lower urinary tract symptoms: Secondary | ICD-10-CM | POA: Diagnosis not present

## 2022-09-24 DIAGNOSIS — I4892 Unspecified atrial flutter: Secondary | ICD-10-CM | POA: Diagnosis not present

## 2022-09-24 DIAGNOSIS — G40909 Epilepsy, unspecified, not intractable, without status epilepticus: Secondary | ICD-10-CM | POA: Diagnosis not present

## 2022-09-24 DIAGNOSIS — Z87891 Personal history of nicotine dependence: Secondary | ICD-10-CM | POA: Diagnosis not present

## 2022-09-24 DIAGNOSIS — G8929 Other chronic pain: Secondary | ICD-10-CM | POA: Diagnosis not present

## 2022-09-24 DIAGNOSIS — I252 Old myocardial infarction: Secondary | ICD-10-CM | POA: Diagnosis not present

## 2022-09-24 DIAGNOSIS — H8109 Meniere's disease, unspecified ear: Secondary | ICD-10-CM | POA: Diagnosis not present

## 2022-09-29 ENCOUNTER — Ambulatory Visit (INDEPENDENT_AMBULATORY_CARE_PROVIDER_SITE_OTHER): Payer: Medicare Other

## 2022-09-29 ENCOUNTER — Other Ambulatory Visit: Payer: Self-pay | Admitting: Cardiology

## 2022-09-29 DIAGNOSIS — I442 Atrioventricular block, complete: Secondary | ICD-10-CM

## 2022-09-29 LAB — CUP PACEART REMOTE DEVICE CHECK
Battery Remaining Longevity: 130 mo
Battery Voltage: 3.02 V
Brady Statistic AP VP Percent: 21.1 %
Brady Statistic AP VS Percent: 0 %
Brady Statistic AS VP Percent: 78.48 %
Brady Statistic AS VS Percent: 0.14 %
Brady Statistic RA Percent Paced: 0.73 %
Brady Statistic RV Percent Paced: 94.45 %
Date Time Interrogation Session: 20240710010251
Implantable Lead Connection Status: 753985
Implantable Lead Connection Status: 753985
Implantable Lead Implant Date: 20220610
Implantable Lead Implant Date: 20220610
Implantable Lead Location: 753859
Implantable Lead Location: 753860
Implantable Lead Model: 3830
Implantable Lead Model: 5076
Implantable Pulse Generator Implant Date: 20220610
Lead Channel Impedance Value: 247 Ohm
Lead Channel Impedance Value: 285 Ohm
Lead Channel Impedance Value: 323 Ohm
Lead Channel Impedance Value: 475 Ohm
Lead Channel Pacing Threshold Amplitude: 0.625 V
Lead Channel Pacing Threshold Amplitude: 0.875 V
Lead Channel Pacing Threshold Pulse Width: 0.4 ms
Lead Channel Pacing Threshold Pulse Width: 0.4 ms
Lead Channel Sensing Intrinsic Amplitude: 1.5 mV
Lead Channel Sensing Intrinsic Amplitude: 1.5 mV
Lead Channel Sensing Intrinsic Amplitude: 10 mV
Lead Channel Sensing Intrinsic Amplitude: 10 mV
Lead Channel Setting Pacing Amplitude: 1.5 V
Lead Channel Setting Pacing Amplitude: 2 V
Lead Channel Setting Pacing Pulse Width: 0.4 ms
Lead Channel Setting Sensing Sensitivity: 1.2 mV
Zone Setting Status: 755011

## 2022-09-29 NOTE — Telephone Encounter (Signed)
Prescription refill request for Xarelto received.  Indication: A Fib/Flutter Last office visit: 05/10/22  Percell Belt NP Weight: 81.6kg Age: 89 Scr: 0.86 on 05/30/22 CrCl: 69.84  Based on above findings Xarelto 20mg  daily is the appropriate dose.  Refill approved.

## 2022-09-30 DIAGNOSIS — G8929 Other chronic pain: Secondary | ICD-10-CM | POA: Diagnosis not present

## 2022-09-30 DIAGNOSIS — M5116 Intervertebral disc disorders with radiculopathy, lumbar region: Secondary | ICD-10-CM | POA: Diagnosis not present

## 2022-09-30 DIAGNOSIS — I5032 Chronic diastolic (congestive) heart failure: Secondary | ICD-10-CM | POA: Diagnosis not present

## 2022-09-30 DIAGNOSIS — I4892 Unspecified atrial flutter: Secondary | ICD-10-CM | POA: Diagnosis not present

## 2022-09-30 DIAGNOSIS — I252 Old myocardial infarction: Secondary | ICD-10-CM | POA: Diagnosis not present

## 2022-09-30 DIAGNOSIS — M1990 Unspecified osteoarthritis, unspecified site: Secondary | ICD-10-CM | POA: Diagnosis not present

## 2022-09-30 DIAGNOSIS — E119 Type 2 diabetes mellitus without complications: Secondary | ICD-10-CM | POA: Diagnosis not present

## 2022-09-30 DIAGNOSIS — G40909 Epilepsy, unspecified, not intractable, without status epilepticus: Secondary | ICD-10-CM | POA: Diagnosis not present

## 2022-09-30 DIAGNOSIS — I2510 Atherosclerotic heart disease of native coronary artery without angina pectoris: Secondary | ICD-10-CM | POA: Diagnosis not present

## 2022-09-30 DIAGNOSIS — E039 Hypothyroidism, unspecified: Secondary | ICD-10-CM | POA: Diagnosis not present

## 2022-09-30 DIAGNOSIS — Z87891 Personal history of nicotine dependence: Secondary | ICD-10-CM | POA: Diagnosis not present

## 2022-09-30 DIAGNOSIS — E785 Hyperlipidemia, unspecified: Secondary | ICD-10-CM | POA: Diagnosis not present

## 2022-09-30 DIAGNOSIS — H8109 Meniere's disease, unspecified ear: Secondary | ICD-10-CM | POA: Diagnosis not present

## 2022-09-30 DIAGNOSIS — I110 Hypertensive heart disease with heart failure: Secondary | ICD-10-CM | POA: Diagnosis not present

## 2022-09-30 DIAGNOSIS — N400 Enlarged prostate without lower urinary tract symptoms: Secondary | ICD-10-CM | POA: Diagnosis not present

## 2022-09-30 DIAGNOSIS — I480 Paroxysmal atrial fibrillation: Secondary | ICD-10-CM | POA: Diagnosis not present

## 2022-10-20 ENCOUNTER — Other Ambulatory Visit: Payer: Self-pay | Admitting: Family Medicine

## 2022-10-20 NOTE — Progress Notes (Signed)
Remote pacemaker transmission.   

## 2022-10-21 NOTE — Telephone Encounter (Signed)
Requested Prescriptions  Pending Prescriptions Disp Refills   amLODipine (NORVASC) 10 MG tablet [Pharmacy Med Name: AMLODIPINE BESYLATE 10MG  TABLETS] 90 tablet 1    Sig: TAKE 1 TABLET(10 MG) BY MOUTH DAILY     Cardiovascular: Calcium Channel Blockers 2 Failed - 10/20/2022 10:08 AM      Failed - Last BP in normal range    BP Readings from Last 1 Encounters:  05/30/22 (!) 163/79         Failed - Valid encounter within last 6 months    Recent Outpatient Visits           1 year ago Essential hypertension   [facility] Tanya Nones, Priscille Heidelberg, MD   1 year ago Hematuria, unspecified type   [facility] Donita Brooks, MD   2 years ago Paroxysmal atrial fibrillation [facility])   [facility] Medicine Donita Brooks, MD   2 years ago Paroxysmal atrial fibrillation [facility])   [facility] Donita Brooks, MD   2 years ago Herpes zoster without complication   [facility] Valentino Nose, NP       Future Appointments             In 1 month Rollene Rotunda, MD [facility] at [facility] - Last Heart Rate in normal range    Pulse Readings from Last 1 Encounters:  05/30/22 (!) 59

## 2022-11-01 ENCOUNTER — Encounter: Payer: Medicare Other | Admitting: Pharmacist

## 2022-11-02 DIAGNOSIS — E785 Hyperlipidemia, unspecified: Secondary | ICD-10-CM | POA: Diagnosis not present

## 2022-11-02 DIAGNOSIS — G8929 Other chronic pain: Secondary | ICD-10-CM | POA: Diagnosis not present

## 2022-11-02 DIAGNOSIS — I480 Paroxysmal atrial fibrillation: Secondary | ICD-10-CM | POA: Diagnosis not present

## 2022-11-02 DIAGNOSIS — M5116 Intervertebral disc disorders with radiculopathy, lumbar region: Secondary | ICD-10-CM | POA: Diagnosis not present

## 2022-11-02 DIAGNOSIS — G40909 Epilepsy, unspecified, not intractable, without status epilepticus: Secondary | ICD-10-CM | POA: Diagnosis not present

## 2022-11-02 DIAGNOSIS — I2510 Atherosclerotic heart disease of native coronary artery without angina pectoris: Secondary | ICD-10-CM | POA: Diagnosis not present

## 2022-11-02 DIAGNOSIS — I110 Hypertensive heart disease with heart failure: Secondary | ICD-10-CM | POA: Diagnosis not present

## 2022-11-02 DIAGNOSIS — I4892 Unspecified atrial flutter: Secondary | ICD-10-CM | POA: Diagnosis not present

## 2022-11-02 DIAGNOSIS — I5032 Chronic diastolic (congestive) heart failure: Secondary | ICD-10-CM | POA: Diagnosis not present

## 2022-11-02 DIAGNOSIS — Z87891 Personal history of nicotine dependence: Secondary | ICD-10-CM | POA: Diagnosis not present

## 2022-11-02 DIAGNOSIS — E119 Type 2 diabetes mellitus without complications: Secondary | ICD-10-CM | POA: Diagnosis not present

## 2022-11-02 DIAGNOSIS — I252 Old myocardial infarction: Secondary | ICD-10-CM | POA: Diagnosis not present

## 2022-11-02 DIAGNOSIS — H8109 Meniere's disease, unspecified ear: Secondary | ICD-10-CM | POA: Diagnosis not present

## 2022-11-02 DIAGNOSIS — N400 Enlarged prostate without lower urinary tract symptoms: Secondary | ICD-10-CM | POA: Diagnosis not present

## 2022-11-02 DIAGNOSIS — M1990 Unspecified osteoarthritis, unspecified site: Secondary | ICD-10-CM | POA: Diagnosis not present

## 2022-11-02 DIAGNOSIS — E039 Hypothyroidism, unspecified: Secondary | ICD-10-CM | POA: Diagnosis not present

## 2022-11-03 DIAGNOSIS — I4892 Unspecified atrial flutter: Secondary | ICD-10-CM | POA: Diagnosis not present

## 2022-11-03 DIAGNOSIS — I252 Old myocardial infarction: Secondary | ICD-10-CM | POA: Diagnosis not present

## 2022-11-03 DIAGNOSIS — I2510 Atherosclerotic heart disease of native coronary artery without angina pectoris: Secondary | ICD-10-CM | POA: Diagnosis not present

## 2022-11-03 DIAGNOSIS — N400 Enlarged prostate without lower urinary tract symptoms: Secondary | ICD-10-CM | POA: Diagnosis not present

## 2022-11-03 DIAGNOSIS — G40909 Epilepsy, unspecified, not intractable, without status epilepticus: Secondary | ICD-10-CM | POA: Diagnosis not present

## 2022-11-03 DIAGNOSIS — E785 Hyperlipidemia, unspecified: Secondary | ICD-10-CM | POA: Diagnosis not present

## 2022-11-03 DIAGNOSIS — G8929 Other chronic pain: Secondary | ICD-10-CM | POA: Diagnosis not present

## 2022-11-03 DIAGNOSIS — E119 Type 2 diabetes mellitus without complications: Secondary | ICD-10-CM | POA: Diagnosis not present

## 2022-11-03 DIAGNOSIS — I5032 Chronic diastolic (congestive) heart failure: Secondary | ICD-10-CM | POA: Diagnosis not present

## 2022-11-03 DIAGNOSIS — H8109 Meniere's disease, unspecified ear: Secondary | ICD-10-CM | POA: Diagnosis not present

## 2022-11-03 DIAGNOSIS — Z87891 Personal history of nicotine dependence: Secondary | ICD-10-CM | POA: Diagnosis not present

## 2022-11-03 DIAGNOSIS — E039 Hypothyroidism, unspecified: Secondary | ICD-10-CM | POA: Diagnosis not present

## 2022-11-03 DIAGNOSIS — I480 Paroxysmal atrial fibrillation: Secondary | ICD-10-CM | POA: Diagnosis not present

## 2022-11-03 DIAGNOSIS — I110 Hypertensive heart disease with heart failure: Secondary | ICD-10-CM | POA: Diagnosis not present

## 2022-11-03 DIAGNOSIS — M5116 Intervertebral disc disorders with radiculopathy, lumbar region: Secondary | ICD-10-CM | POA: Diagnosis not present

## 2022-11-03 DIAGNOSIS — M1990 Unspecified osteoarthritis, unspecified site: Secondary | ICD-10-CM | POA: Diagnosis not present

## 2022-11-23 ENCOUNTER — Other Ambulatory Visit: Payer: Self-pay | Admitting: Family Medicine

## 2022-11-25 NOTE — Telephone Encounter (Signed)
Requested medications are due for refill today.  yes  Requested medications are on the active medications list.  yes  Last refill. 05/12/2022 #90 1 rf  Future visit scheduled.   no  Notes to clinic.  Pt is due for CPE.    Requested Prescriptions  Pending Prescriptions Disp Refills   donepezil (ARICEPT) 5 MG tablet [Pharmacy Med Name: DONEPEZIL 5MG  TABLETS] 90 tablet 1    Sig: TAKE 1 TABLET(5 MG) BY MOUTH AT BEDTIME     Neurology:  Alzheimer's Agents Failed - 11/23/2022  3:17 PM      Failed - Valid encounter within last 6 months    Recent Outpatient Visits           1 year ago Essential hypertension   [facility] Pickard, Priscille Heidelberg, MD   1 year ago Hematuria, unspecified type   [facility] Donita Brooks, MD   2 years ago Paroxysmal atrial fibrillation [facility])   [facility] Donita Brooks, MD   2 years ago Paroxysmal atrial fibrillation [facility])   [facility] Donita Brooks, MD   2 years ago Herpes zoster without complication   [facility] Medicine Valentino Nose, NP       Future Appointments             In 2 weeks Rollene Rotunda, MD [facility] at [facility]

## 2022-12-01 DIAGNOSIS — G8929 Other chronic pain: Secondary | ICD-10-CM | POA: Diagnosis not present

## 2022-12-01 DIAGNOSIS — G40909 Epilepsy, unspecified, not intractable, without status epilepticus: Secondary | ICD-10-CM | POA: Diagnosis not present

## 2022-12-01 DIAGNOSIS — I5032 Chronic diastolic (congestive) heart failure: Secondary | ICD-10-CM | POA: Diagnosis not present

## 2022-12-01 DIAGNOSIS — H8109 Meniere's disease, unspecified ear: Secondary | ICD-10-CM | POA: Diagnosis not present

## 2022-12-01 DIAGNOSIS — E119 Type 2 diabetes mellitus without complications: Secondary | ICD-10-CM | POA: Diagnosis not present

## 2022-12-01 DIAGNOSIS — E785 Hyperlipidemia, unspecified: Secondary | ICD-10-CM | POA: Diagnosis not present

## 2022-12-01 DIAGNOSIS — Z87891 Personal history of nicotine dependence: Secondary | ICD-10-CM | POA: Diagnosis not present

## 2022-12-01 DIAGNOSIS — N400 Enlarged prostate without lower urinary tract symptoms: Secondary | ICD-10-CM | POA: Diagnosis not present

## 2022-12-01 DIAGNOSIS — M1990 Unspecified osteoarthritis, unspecified site: Secondary | ICD-10-CM | POA: Diagnosis not present

## 2022-12-01 DIAGNOSIS — M5116 Intervertebral disc disorders with radiculopathy, lumbar region: Secondary | ICD-10-CM | POA: Diagnosis not present

## 2022-12-01 DIAGNOSIS — I2510 Atherosclerotic heart disease of native coronary artery without angina pectoris: Secondary | ICD-10-CM | POA: Diagnosis not present

## 2022-12-01 DIAGNOSIS — I4892 Unspecified atrial flutter: Secondary | ICD-10-CM | POA: Diagnosis not present

## 2022-12-01 DIAGNOSIS — I252 Old myocardial infarction: Secondary | ICD-10-CM | POA: Diagnosis not present

## 2022-12-01 DIAGNOSIS — I110 Hypertensive heart disease with heart failure: Secondary | ICD-10-CM | POA: Diagnosis not present

## 2022-12-01 DIAGNOSIS — E039 Hypothyroidism, unspecified: Secondary | ICD-10-CM | POA: Diagnosis not present

## 2022-12-01 DIAGNOSIS — I480 Paroxysmal atrial fibrillation: Secondary | ICD-10-CM | POA: Diagnosis not present

## 2022-12-02 DIAGNOSIS — H524 Presbyopia: Secondary | ICD-10-CM | POA: Diagnosis not present

## 2022-12-02 DIAGNOSIS — H52223 Regular astigmatism, bilateral: Secondary | ICD-10-CM | POA: Diagnosis not present

## 2022-12-03 ENCOUNTER — Ambulatory Visit: Payer: Medicare Other | Attending: Internal Medicine | Admitting: Internal Medicine

## 2022-12-03 ENCOUNTER — Other Ambulatory Visit: Payer: Self-pay | Admitting: Family Medicine

## 2022-12-03 ENCOUNTER — Encounter: Payer: Self-pay | Admitting: Internal Medicine

## 2022-12-03 VITALS — BP 180/90 | Ht 68.0 in | Wt 181.0 lb

## 2022-12-03 DIAGNOSIS — I480 Paroxysmal atrial fibrillation: Secondary | ICD-10-CM | POA: Diagnosis not present

## 2022-12-03 LAB — CUP PACEART INCLINIC DEVICE CHECK
Date Time Interrogation Session: 20240913202112
Implantable Lead Connection Status: 753985
Implantable Lead Connection Status: 753985
Implantable Lead Implant Date: 20220610
Implantable Lead Implant Date: 20220610
Implantable Lead Location: 753859
Implantable Lead Location: 753860
Implantable Lead Model: 3830
Implantable Lead Model: 5076
Implantable Pulse Generator Implant Date: 20220610

## 2022-12-03 NOTE — Patient Instructions (Signed)
Medication Instructions:  Your physician recommends that you continue on your current medications as directed. Please refer to the Current Medication list given to you today.  *If you need a refill on your cardiac medications before your next appointment, please call your pharmacy*  Lab Work: None ordered.  If you have labs (blood work) drawn today and your tests are completely normal, you will receive your results only by: MyChart Message (if you have MyChart) OR A paper copy in the mail If you have any lab test that is abnormal or we need to change your treatment, we will call you to review the results.  Testing/Procedures: None ordered.  Follow-Up: At [facility], you and your health needs are our priority.  As part of our continuing mission to provide you with exceptional heart care, we have created designated Provider Care Teams.  These Care Teams include your primary Cardiologist (physician) and Advanced Practice Providers (APPs -  Physician Assistants and Nurse Practitioners) who all work together to provide you with the care you need, when you need it.   Your next appointment:   1 year(s)  The format for your next appointment:   In Person  Provider:   Lewayne Bunting, MD{or one of the following Advanced Practice Providers on your designated Care Team:   Francis Dowse, [place] Casimiro Needle "Mardelle Matte" [place], [place] Earnest Rosier, NP   Important Information About Sugar

## 2022-12-03 NOTE — Progress Notes (Signed)
HPI [name] returns today for followup. He is a pleasant 89 yo man with a h/o symptomatic intermittant CHB, s/p PPM insertion. He has been found to have PAF and placed on Xarelto. He fell and hurt his knee several months ago and is now using a walker. He denies chest pain or sob but is sedentary from his knee. No edema. No pain at his PM insertion site.  Allergies  Allergen Reactions   Penicillins     Did it involve swelling of the face/tongue/throat, SOB, or low BP? Unknown Did it involve sudden or severe rash/hives, skin peeling, or any reaction on the inside of your mouth or nose? Unknown Did you need to seek medical attention at a hospital or doctor's office? No When did it last happen?      Decades Ago If all above answers are "NO", may proceed with cephalosporin use.       Current Outpatient Medications  Medication Sig Dispense Refill   acetaminophen (TYLENOL) 325 MG tablet Take 1-2 tablets (325-650 mg total) by mouth every 4 (four) hours as needed for mild pain.     amLODipine (NORVASC) 10 MG tablet TAKE 1 TABLET(10 MG) BY MOUTH DAILY 90 tablet 1   atorvastatin (LIPITOR) 80 MG tablet TAKE 1 TABLET BY MOUTH EVERY DAY AT 6 PM 90 tablet 3   carbamazepine (CARBATROL) 300 MG 12 hr capsule TAKE 1 CAPSULE(300 MG) BY MOUTH TWICE DAILY (Patient taking differently: Take 300 mg by mouth 2 (two) times daily.) 180 capsule 3   cloNIDine (CATAPRES) 0.2 MG tablet TAKE 1 TABLET(0.2 MG) BY MOUTH TWICE DAILY 180 tablet 3   donepezil (ARICEPT) 5 MG tablet TAKE 1 TABLET(5 MG) BY MOUTH AT BEDTIME 90 tablet 1   escitalopram (LEXAPRO) 5 MG tablet TAKE 1 TABLET BY MOUTH EVERY DAY AT BEDTIME 90 tablet 2   furosemide (LASIX) 20 MG tablet TAKE 1/2 TABLET(10 MG) BY MOUTH DAILY 45 tablet 3   glucose blood test strip 1 each by Other route as needed. Use as instructed 100 each 3   glucose monitoring kit (FREESTYLE) monitoring kit 1 each by Does not apply route as needed. 1 each 0    HYDROcodone-acetaminophen (NORCO/VICODIN) 5-325 MG tablet Take 1 tablet by mouth every 6 (six) hours as needed. 12 tablet 0   isosorbide mononitrate (IMDUR) 60 MG 24 hr tablet TAKE 1/2 TABLET BY MOUTH EVERY DAY 90 tablet 3   Lancets (ACCU-CHEK SAFE-T PRO) lancets 1 each by Other route as needed. Use as instructed 100 each 3   levETIRAcetam (KEPPRA) 500 MG tablet TAKE 1 TABLET(500 MG) BY MOUTH TWICE DAILY 60 tablet 3   levothyroxine (SYNTHROID) 88 MCG tablet TAKE 1 TABLET(88 MCG) BY MOUTH DAILY 90 tablet 3   losartan (COZAAR) 100 MG tablet TAKE 1/2 TABLET BY MOUTH EVERY DAY 90 tablet 3   Multiple Vitamins-Minerals (MULTIVITAMINS THER. W/MINERALS) TABS Take 1 tablet by mouth at bedtime.       MYRBETRIQ 50 MG TB24 tablet Take 50 mg by mouth daily.     predniSONE (DELTASONE) 20 MG tablet 3 tabs poqday 1-2, 2 tabs poqday 3-4, 1 tab poqday 5-6 12 tablet 0   rivaroxaban (XARELTO) 20 MG TABS tablet TAKE 1 TABLET(20 MG) BY MOUTH DAILY WITH SUPPER 90 tablet 1   tamsulosin (FLOMAX) 0.4 MG CAPS capsule Take 1 capsule (0.4 mg total) by mouth daily after supper. TAKE 1 CAPSULE(0.4 MG) BY MOUTH DAILY AFTER AND SUPPER Strength: 0.4 mg 90 capsule  3   No current facility-administered medications for this visit.     Past Medical History:  Diagnosis Date   Atrial flutter (HCC)    BPH (benign prostatic hyperplasia)    Coronary artery disease    a. s/p MI tx with Cypher DES to pRCA in 4/04;  b. Echocardiogram 7/09: Normal LV function.  c. Nuclear study 3/13 no ischemia;  d. ETT 2/14 neg;  e. admx with CP => LHC (01/30/2013):  pLAD 40-50%, oD1 70-80%, oOM1 40%, pRCA stent patent, mid RCA 30%, EF 60-65%. => med Rx.   DDD (degenerative disc disease)    Diabetes mellitus without complication (HCC)    Dyslipidemia    Gait disorder 07/13/2018   Hemorrhoids    Hx MRSA infection    left buttocks abscess   Hx of echocardiogram    a. Echocardiogram (01/31/2013): Mild focal basal and mild concentric hypertrophy of the  septum, EF 50-55%, normal wall motion, grade 1 diastolic dysfunction, trivial AI, MAC, mild LAE, PASP 35   Hyperlipidemia    Hypertension    Hypothyroidism    Meniere's disease    Status post shunt   Myocardial infarction [facility]) 2008   Occlusion and stenosis of carotid artery without mention of cerebral infarction    40-59% on carotid doppler 2014; [place] (01/2013): R 1-39%, L 60-79%   Rotator cuff injury    chronic rotator cuff injury status post repair   Seizure disorder (HCC)    Stroke (HCC)    a. 01/2013=> post cardiac cath CVA to L post communicating artery system; R sided weakness   Syncope     ROS:   All systems reviewed and negative except as noted in the HPI.   Past Surgical History:  Procedure Laterality Date   CARDIOVERSION N/A 05/26/2022   Procedure: CARDIOVERSION;  Surgeon: Meriam Sprague, MD;  Location: [facility] ENDOSCOPY;  Service: Cardiovascular;  Laterality: N/A;   COLONOSCOPY     CORONARY ANGIOPLASTY WITH STENT PLACEMENT  07/14/2002   Stent to the right coronary artery   ENDOLYMPHATIC SHUNT DECOMPRESSION  06/11/2009    Right endolymphatic sac decompression and shunt  placement   HERNIA REPAIR  04/10/2008   scrotal hernia repair   LEFT HEART CATHETERIZATION WITH CORONARY ANGIOGRAM N/A 01/30/2013   Procedure: LEFT HEART CATHETERIZATION WITH CORONARY ANGIOGRAM;  Surgeon: Laurey Morale, MD;  Location[facility] CATH LAB;  Service: Cardiovascular;  Laterality: N/A;   PACEMAKER IMPLANT N/A 08/29/2020   Procedure: PACEMAKER IMPLANT;  Surgeon: Marinus Maw, MD;  Location: [facility] INVASIVE CV LAB;  Service: Cardiovascular;  Laterality: N/A;   ROTATOR CUFF REPAIR     for chronic rotator cuff injury   TEE WITHOUT CARDIOVERSION N/A 05/26/2022   Procedure: TRANSESOPHAGEAL ECHOCARDIOGRAM (TEE);  Surgeon: Meriam Sprague, MD;  Location: [facility] ENDOSCOPY;  Service: Cardiovascular;  Laterality: N/A;     Family History  Problem Relation Age of Onset   Heart attack Father 47   Aneurysm  Mother        brain aneurysm   Coronary artery disease Brother        with CABG   Diabetes Brother    Colon cancer Neg Hx    Colon polyps Neg Hx      Social History   Socioeconomic History   Marital status: Married    Spouse name: Not on file   Number of children: 4   Years of education: 12th   Highest education level: Not on file  Occupational History  Occupation: Retired  Tobacco Use   Smoking status: Former    Current packs/day: 0.00    Types: Cigarettes    Start date: 08/20/1951    Quit date: 08/19/1956    Years since quitting: 66.3   Smokeless tobacco: Former    Types: Engineer, drilling   Vaping status: Never Used  Substance and Sexual Activity   Alcohol use: No   Drug use: No   Sexual activity: Never  Other Topics Concern   Not on file  Social History Narrative   Lives with wife.   Children live nearby and help with care.    Social Determinants of Health   Financial Resource Strain: High Risk (03/12/2022)   Overall Financial Resource Strain (CARDIA)    Difficulty of Paying Living Expenses: Hard  Food Insecurity: No Food Insecurity (03/12/2022)   Hunger Vital Sign    Worried About Running Out of Food in the Last Year: Never true    Ran Out of Food in the Last Year: Never true  Transportation Needs: No Transportation Needs (03/12/2022)   PRAPARE - Administrator, Civil Service (Medical): No    Lack of Transportation (Non-Medical): No  Physical Activity: Inactive (03/12/2022)   Exercise Vital Sign    Days of Exercise per Week: 2 days    Minutes of Exercise per Session: 0 min  Stress: No Stress Concern Present (03/12/2022)   [employer] of Occupational Health - Occupational Stress Questionnaire    Feeling of Stress : Not at all  Social Connections: Moderately Integrated (03/12/2022)   Social Connection and Isolation Panel [NHANES]    Frequency of Communication with Friends and Family: More than three times a week    Frequency of Social  Gatherings with Friends and Family: More than three times a week    Attends Religious Services: Never    Database administrator or Organizations: Yes    Attends Banker Meetings: 1 to 4 times per year    Marital Status: Married  Catering manager Violence: Not At Risk (03/12/2022)   Humiliation, Afraid, Rape, and Kick questionnaire    Fear of Current or Ex-Partner: No    Emotionally Abused: No    Physically Abused: No    Sexually Abused: No     BP (!) 180/90   Ht 5\' 8"  (1.727 m)   Wt 181 lb (82.1 kg)   SpO2 98%   BMI 27.52 kg/m   Physical Exam:  Well appearing NAD HEENT: Unremarkable Neck:  No JVD, no thyromegally Lymphatics:  No adenopathy Back:  No CVA tenderness Lungs:  Clear HEART:  Regular rate rhythm, no murmurs, no rubs, no clicks Abd:  soft, positive bowel sounds, no organomegally, no rebound, no guarding Ext:  2 plus pulses, no edema, no cyanosis, no clubbing Skin:  No rashes no nodules Neuro:  CN II through XII intact, motor grossly intact  EKG  DEVICE  Normal device function.  See PaceArt for details.   Assess/Plan:  CHB  - he is asymptomatic, s/p PPM insertion. PPM - his medtronic DDD PM is working normally. We will recheck in several months. HTN - his bp is reasonably well controlled. CAD - he is s/p remote stenting and denies anginal symptoms. We will continue his current meds. Atrial flutter - we attempted to pace terminate his flutter today as he stated that he had not missed his dose of xarelto recently and ultimately paced him into atrial fib. We will follow.  Sharlot Gowda Hiliary Osorto,MD

## 2022-12-06 NOTE — Telephone Encounter (Signed)
Requested Prescriptions  Refused Prescriptions Disp Refills   donepezil (ARICEPT) 5 MG tablet [Pharmacy Med Name: DONEPEZIL 5MG  TABLETS] 90 tablet 1    Sig: TAKE 1 TABLET(5 MG) BY MOUTH AT BEDTIME     Neurology:  Alzheimer's Agents Failed - 12/03/2022 10:01 AM      Failed - Valid encounter within last 6 months    Recent Outpatient Visits           1 year ago Essential hypertension   [facility] Tanya Nones, Priscille Heidelberg, MD   1 year ago Hematuria, unspecified type   [facility] Donita Brooks, MD   2 years ago Paroxysmal atrial fibrillation [facility])   [facility] Donita Brooks, MD   2 years ago Paroxysmal atrial fibrillation [facility])   [facility] Donita Brooks, MD   2 years ago Herpes zoster without complication   [facility] Valentino Nose, NP       Future Appointments             In 1 week Rollene Rotunda, MD [facility] at [facility]

## 2022-12-09 DIAGNOSIS — R351 Nocturia: Secondary | ICD-10-CM | POA: Diagnosis not present

## 2022-12-09 DIAGNOSIS — R3915 Urgency of urination: Secondary | ICD-10-CM | POA: Diagnosis not present

## 2022-12-09 DIAGNOSIS — N401 Enlarged prostate with lower urinary tract symptoms: Secondary | ICD-10-CM | POA: Diagnosis not present

## 2022-12-14 NOTE — Progress Notes (Deleted)
Cardiology Office Note:   Date:  12/14/2022  ID:  [name], DOB 05-16-1933, MRN [id number] PCP: Donita Brooks, MD   HeartCare Providers Cardiologist:  Rollene Rotunda, MD Electrophysiologist:  Lewayne Bunting, MD {  History of Present Illness:   [name] is a 89 y.o. male for follow up of CAD and CVA following a cath in 2014.  He has also had atrial flutter/fibrillation.    He was admitted in early April 2022 with he had lower extremity weakness.  MRI demonstrated spinal stenosis.  He was thought to be dehydrated.  He was admitted a couple of days later with syncope.  He subsequently had a pacemaker for CHB.     Since he was last seen he has done well.  He has fallen a couple times when he has not been using his walker. The patient denies any new symptoms such as chest discomfort, neck or arm discomfort. There has been no new shortness of breath, PND or orthopnea. There have been no reported palpitations, presyncope or syncope.  Because of the falls he is having some knee pain which is his biggest limitation.    ROS: ***  Studies Reviewed:    EKG:       ***  Risk Assessment/Calculations:   {Does this patient have ATRIAL FIBRILLATION?[phone]} No BP recorded.  {Refresh Note OR Click here to enter BP  :1}***        Physical Exam:   VS:  There were no vitals taken for this visit.   Wt Readings from Last 3 Encounters:  12/03/22 181 lb (82.1 kg)  05/30/22 172 lb (78 kg)  05/26/22 172 lb (78 kg)     GEN: Well nourished, well developed in no acute distress NECK: No JVD; No carotid bruits CARDIAC: ***RRR, no murmurs, rubs, gallops RESPIRATORY:  Clear to auscultation without rales, wheezing or rhonchi  ABDOMEN: Soft, non-tender, non-distended EXTREMITIES:  No edema; No deformity   ASSESSMENT AND PLAN:   Atrial flutter:  ***     He tolerates anticoagulation.    No change in therapy.  He is tolerating Xarelto.   Status post pacemaker placement.    He  had follow up with Dr. Ladona Ridgel earlier this month and did well.  ***  He will follow-up with EP.   Hypertension:   Blood pressure is ***  slightly elevated.  He is going to keep a blood pressure diary.  I looked back at previous readings and it is usually not this elevated.    Chronic diastolic heart failure: He seems to be euvolemic.  ***  No change in therapy.   CAD:  ***    He is having no chest pain.  He is continuing with risk reduction.    Hypothyroidism:   ***  TSH was within normal limits earlier this year.  No change in therapy.   Hyperlipidemia:    LDL was ***  99 with an HDL of 78.  He will continue the meds as listed.    Bilateral carotid artery disease:   ***  He has had mild plaque in the past.  No further imaging at this point.    {Are you ordering a CV Procedure (e.g. stress test, cath, DCCV, TEE, etc)?   Press F2        :[id number]}  Follow up ***  Signed, Rollene Rotunda, MD

## 2022-12-15 ENCOUNTER — Ambulatory Visit: Payer: Medicare Other | Attending: Cardiology | Admitting: Cardiology

## 2022-12-15 DIAGNOSIS — I6523 Occlusion and stenosis of bilateral carotid arteries: Secondary | ICD-10-CM

## 2022-12-15 DIAGNOSIS — I2510 Atherosclerotic heart disease of native coronary artery without angina pectoris: Secondary | ICD-10-CM

## 2022-12-15 DIAGNOSIS — E785 Hyperlipidemia, unspecified: Secondary | ICD-10-CM

## 2022-12-15 DIAGNOSIS — I5032 Chronic diastolic (congestive) heart failure: Secondary | ICD-10-CM

## 2022-12-15 DIAGNOSIS — I4892 Unspecified atrial flutter: Secondary | ICD-10-CM

## 2022-12-15 DIAGNOSIS — Z950 Presence of cardiac pacemaker: Secondary | ICD-10-CM

## 2022-12-16 ENCOUNTER — Encounter: Payer: Self-pay | Admitting: Cardiology

## 2022-12-29 ENCOUNTER — Ambulatory Visit (INDEPENDENT_AMBULATORY_CARE_PROVIDER_SITE_OTHER): Payer: Medicare Other

## 2022-12-29 ENCOUNTER — Other Ambulatory Visit: Payer: Self-pay | Admitting: Family Medicine

## 2022-12-29 DIAGNOSIS — I441 Atrioventricular block, second degree: Secondary | ICD-10-CM

## 2022-12-29 LAB — CUP PACEART REMOTE DEVICE CHECK
Battery Remaining Longevity: 126 mo
Battery Voltage: 3.02 V
Brady Statistic AP VP Percent: 15.03 %
Brady Statistic AP VS Percent: 0 %
Brady Statistic AS VP Percent: 84.69 %
Brady Statistic AS VS Percent: 0.05 %
Brady Statistic RA Percent Paced: 6.77 %
Brady Statistic RV Percent Paced: 97.41 %
Date Time Interrogation Session: 20241008231418
Implantable Lead Connection Status: 753985
Implantable Lead Connection Status: 753985
Implantable Lead Implant Date: 20220610
Implantable Lead Implant Date: 20220610
Implantable Lead Location: 753859
Implantable Lead Location: 753860
Implantable Lead Model: 3830
Implantable Lead Model: 5076
Implantable Pulse Generator Implant Date: 20220610
Lead Channel Impedance Value: 247 Ohm
Lead Channel Impedance Value: 285 Ohm
Lead Channel Impedance Value: 304 Ohm
Lead Channel Impedance Value: 456 Ohm
Lead Channel Pacing Threshold Amplitude: 0.75 V
Lead Channel Pacing Threshold Amplitude: 0.875 V
Lead Channel Pacing Threshold Pulse Width: 0.4 ms
Lead Channel Pacing Threshold Pulse Width: 0.4 ms
Lead Channel Sensing Intrinsic Amplitude: 1.625 mV
Lead Channel Sensing Intrinsic Amplitude: 1.625 mV
Lead Channel Sensing Intrinsic Amplitude: 17.375 mV
Lead Channel Sensing Intrinsic Amplitude: 17.375 mV
Lead Channel Setting Pacing Amplitude: 1.5 V
Lead Channel Setting Pacing Amplitude: 2 V
Lead Channel Setting Pacing Pulse Width: 0.4 ms
Lead Channel Setting Sensing Sensitivity: 1.2 mV
Zone Setting Status: 755011

## 2022-12-29 NOTE — Telephone Encounter (Signed)
Last OV 03/29/22 Requested Prescriptions  Pending Prescriptions Disp Refills   levETIRAcetam (KEPPRA) 500 MG tablet [Pharmacy Med Name: LEVETIRACETAM 500MG  TABLETS] 60 tablet 3    Sig: TAKE 1 TABLET(500 MG) BY MOUTH TWICE DAILY     Neurology:  Anticonvulsants - levetiracetam Failed - 12/29/2022  9:46 AM      Failed - Valid encounter within last 12 months    Recent Outpatient Visits           1 year ago Essential hypertension   [facility] Pickard, Priscille Heidelberg, MD   1 year ago Hematuria, unspecified type   [facility] Donita Brooks, MD   2 years ago Paroxysmal atrial fibrillation [facility])   [facility] Donita Brooks, MD   2 years ago Paroxysmal atrial fibrillation [facility])   [facility] Donita Brooks, MD   2 years ago Herpes zoster without complication   [facility] Valentino Nose, NP              Passed - Cr in normal range and within 360 days    Creat  Date Value Ref Range Status  03/29/2022 1.14 0.70 - 1.22 mg/dL Final   Creatinine, Ser  Date Value Ref Range Status  05/30/2022 0.86 0.61 - 1.24 mg/dL Final         Passed - Completed PHQ-2 or PHQ-9 in the last 360 days

## 2022-12-30 ENCOUNTER — Encounter: Payer: Self-pay | Admitting: Neurology

## 2022-12-30 ENCOUNTER — Ambulatory Visit: Payer: Medicare Other | Admitting: Neurology

## 2023-01-13 ENCOUNTER — Other Ambulatory Visit: Payer: Self-pay | Admitting: Family Medicine

## 2023-01-14 NOTE — Telephone Encounter (Signed)
Requested medication (s) are due for refill today: Yes  Requested medication (s) are on the active medication list: Yes  Last refill:  10/13/21  Future visit scheduled: No  Notes to clinic:  Prescription has expired.    Requested Prescriptions  Pending Prescriptions Disp Refills   isosorbide mononitrate (IMDUR) 60 MG 24 hr tablet [Pharmacy Med Name: ISOSORBIDE MONONITRATE 60MG  ER TABS] 90 tablet 3    Sig: TAKE 1/2 TABLET BY MOUTH EVERY DAY     Cardiovascular:  Nitrates Failed - 01/13/2023 11:17 AM      Failed - Last BP in normal range    BP Readings from Last 1 Encounters:  12/03/22 (!) 180/90         Failed - Valid encounter within last 12 months    Recent Outpatient Visits           1 year ago Essential hypertension   [facility] Donita Brooks, MD   2 years ago Hematuria, unspecified type   [facility] Donita Brooks, MD   2 years ago Paroxysmal atrial fibrillation [facility])   [facility] Donita Brooks, MD   2 years ago Paroxysmal atrial fibrillation [facility])   [facility] Donita Brooks, MD   2 years ago Herpes zoster without complication   [facility] Valentino Nose, NP              Passed - Last Heart Rate in normal range    Pulse Readings from Last 1 Encounters:  05/30/22 (!) 59

## 2023-01-19 NOTE — Progress Notes (Signed)
Remote pacemaker transmission.   

## 2023-01-24 ENCOUNTER — Other Ambulatory Visit: Payer: Self-pay | Admitting: Family Medicine

## 2023-01-24 DIAGNOSIS — E785 Hyperlipidemia, unspecified: Secondary | ICD-10-CM

## 2023-02-01 ENCOUNTER — Other Ambulatory Visit: Payer: Self-pay | Admitting: Family Medicine

## 2023-02-01 NOTE — Telephone Encounter (Signed)
Requested medication (s) are due for refill today: yes  Requested medication (s) are on the active medication list: yes  Last refill:  12/30/21 #180/3  Future visit scheduled: no  Notes to clinic:  Unable to refill per protocol due to failed labs, no updated results.      Requested Prescriptions  Pending Prescriptions Disp Refills   carbamazepine (CARBATROL) 300 MG 12 hr capsule [Pharmacy Med Name: CARBAMAZEPINE 300MG  ER CAPSULES] 180 capsule 3    Sig: TAKE 1 CAPSULE(300 MG) BY MOUTH TWICE DAILY     Neurology:  Anticonvulsants - carbamazepine Failed - 02/01/2023 10:47 AM      Failed - Carbamazepine (serum) in normal range and within 360 days    Carbamazepine (Tegretol), S  Date Value Ref Range Status  01/28/2022 12.8 (HH) 4.0 - 12.0 ug/mL Final    Comment:             In conjunction with other antiepileptic drugs                                Therapeutic  4.0 -  8.0                                Toxicity     9.0 - 12.0                                    Carbamazepine alone                                Therapeutic  8.0 - 12.0                                 Detection Limit =  2.0                           <2.0 indicates None Detected Patient drug level exceeds published reference range.  Evaluate clinically for signs of potential toxicity.          Failed - HGB in normal range and within 360 days    Hemoglobin  Date Value Ref Range Status  05/30/2022 11.9 (L) 13.0 - 17.0 g/dL Final  40/98/1191 47.8 (L) 13.0 - 17.7 g/dL Final         Failed - HCT in normal range and within 360 days    HCT  Date Value Ref Range Status  05/30/2022 36.7 (L) 39.0 - 52.0 % Final   Hematocrit  Date Value Ref Range Status  07/29/2020 33.9 (L) 37.5 - 51.0 % Final         Failed - Valid encounter within last 12 months    Recent Outpatient Visits           1 year ago Essential hypertension   [facility] Donita Brooks, MD   2 years ago Hematuria, unspecified type    [facility] Donita Brooks, MD   2 years ago Paroxysmal atrial fibrillation [facility])   [facility] Donita Brooks, MD   2 years ago Paroxysmal atrial fibrillation [facility])   [facility] Donita Brooks,  MD   2 years ago Herpes zoster without complication   [facility] Valentino Nose, NP              Passed - AST in normal range and within 360 days    AST  Date Value Ref Range Status  03/29/2022 15 10 - 35 U/L Final         Passed - ALT in normal range and within 360 days    ALT  Date Value Ref Range Status  03/29/2022 10 9 - 46 U/L Final         Passed - WBC in normal range and within 360 days    WBC  Date Value Ref Range Status  05/30/2022 8.7 4.0 - 10.5 K/uL Final         Passed - PLT in normal range and within 360 days    Platelets  Date Value Ref Range Status  05/30/2022 214 150 - 400 K/uL Final  07/29/2020 247 150 - 450 x10E3/uL Final         Passed - Na in normal range and within 360 days    Sodium  Date Value Ref Range Status  05/30/2022 135 135 - 145 mmol/L Final  01/28/2022 139 134 - 144 mmol/L Final         Passed - Cr in normal range and within 360 days    Creat  Date Value Ref Range Status  03/29/2022 1.14 0.70 - 1.22 mg/dL Final   Creatinine, Ser  Date Value Ref Range Status  05/30/2022 0.86 0.61 - 1.24 mg/dL Final         Passed - Completed PHQ-2 or PHQ-9 in the last 360 days

## 2023-02-10 ENCOUNTER — Telehealth: Payer: Self-pay

## 2023-02-10 ENCOUNTER — Other Ambulatory Visit: Payer: Self-pay | Admitting: Family Medicine

## 2023-02-10 ENCOUNTER — Other Ambulatory Visit: Payer: Self-pay

## 2023-02-10 DIAGNOSIS — G40909 Epilepsy, unspecified, not intractable, without status epilepticus: Secondary | ICD-10-CM

## 2023-02-10 MED ORDER — CARBAMAZEPINE ER 300 MG PO CP12: 300.0000 mg | ORAL_CAPSULE | Freq: Two times a day (BID) | ORAL | 3 refills | Status: DC

## 2023-02-10 NOTE — Telephone Encounter (Signed)
Copied from CRM [id number]. Topic: Clinical - Prescription Issue >> Feb 10, 2023  2:09 PM [name] wrote: Reason for CRM: Patient called in stating that Dr. Tanya Nones is refusing to renew/rewrite rx for Carbamazepine 300mg . Pt is wanting to know why or do he need to quit taking it all together? Pt states he has been out of this medication for about 2 weeks now. Pt is requesting a phone call back at [phone] to discuss.  Pt has not been seen since January 2024. Ok to fill or would you like pt to come in first?

## 2023-02-10 NOTE — Telephone Encounter (Signed)
Copied from CRM [id number]. Topic: Clinical - Prescription Issue >> Feb 10, 2023 10:36 AM Amy B wrote: Reason for CRM: Patient states he is out of carbamazepine.  Pharmacy is refusing to refill.  [facility] Phone: [phone]  Fax: [phone].  Patient requests a call back to discuss.

## 2023-02-28 ENCOUNTER — Other Ambulatory Visit: Payer: Self-pay | Admitting: Family Medicine

## 2023-02-28 DIAGNOSIS — I2510 Atherosclerotic heart disease of native coronary artery without angina pectoris: Secondary | ICD-10-CM

## 2023-02-28 DIAGNOSIS — R319 Hematuria, unspecified: Secondary | ICD-10-CM

## 2023-02-28 DIAGNOSIS — G8191 Hemiplegia, unspecified affecting right dominant side: Secondary | ICD-10-CM

## 2023-03-02 DIAGNOSIS — C4359 Malignant melanoma of other part of trunk: Secondary | ICD-10-CM | POA: Diagnosis not present

## 2023-03-02 DIAGNOSIS — D485 Neoplasm of uncertain behavior of skin: Secondary | ICD-10-CM | POA: Diagnosis not present

## 2023-03-28 ENCOUNTER — Other Ambulatory Visit: Payer: Self-pay | Admitting: Family Medicine

## 2023-03-28 DIAGNOSIS — R319 Hematuria, unspecified: Secondary | ICD-10-CM

## 2023-03-28 DIAGNOSIS — G8191 Hemiplegia, unspecified affecting right dominant side: Secondary | ICD-10-CM

## 2023-03-28 DIAGNOSIS — I2510 Atherosclerotic heart disease of native coronary artery without angina pectoris: Secondary | ICD-10-CM

## 2023-03-30 ENCOUNTER — Ambulatory Visit (INDEPENDENT_AMBULATORY_CARE_PROVIDER_SITE_OTHER): Payer: Medicare Other

## 2023-03-30 DIAGNOSIS — I441 Atrioventricular block, second degree: Secondary | ICD-10-CM | POA: Diagnosis not present

## 2023-03-30 LAB — CUP PACEART REMOTE DEVICE CHECK
Battery Remaining Longevity: 123 mo
Battery Voltage: 3.02 V
Brady Statistic AP VP Percent: 22.64 %
Brady Statistic AP VS Percent: 0.03 %
Brady Statistic AS VP Percent: 76.49 %
Brady Statistic AS VS Percent: 0.84 %
Brady Statistic RA Percent Paced: 22.8 %
Brady Statistic RV Percent Paced: 99.13 %
Date Time Interrogation Session: 20250108024856
Implantable Lead Connection Status: 753985
Implantable Lead Connection Status: 753985
Implantable Lead Implant Date: 20220610
Implantable Lead Implant Date: 20220610
Implantable Lead Location: 753859
Implantable Lead Location: 753860
Implantable Lead Model: 3830
Implantable Lead Model: 5076
Implantable Pulse Generator Implant Date: 20220610
Lead Channel Impedance Value: 247 Ohm
Lead Channel Impedance Value: 266 Ohm
Lead Channel Impedance Value: 304 Ohm
Lead Channel Impedance Value: 456 Ohm
Lead Channel Pacing Threshold Amplitude: 0.75 V
Lead Channel Pacing Threshold Amplitude: 0.875 V
Lead Channel Pacing Threshold Pulse Width: 0.4 ms
Lead Channel Pacing Threshold Pulse Width: 0.4 ms
Lead Channel Sensing Intrinsic Amplitude: 1.375 mV
Lead Channel Sensing Intrinsic Amplitude: 1.375 mV
Lead Channel Sensing Intrinsic Amplitude: 26.75 mV
Lead Channel Sensing Intrinsic Amplitude: 26.75 mV
Lead Channel Setting Pacing Amplitude: 1.5 V
Lead Channel Setting Pacing Amplitude: 2 V
Lead Channel Setting Pacing Pulse Width: 0.4 ms
Lead Channel Setting Sensing Sensitivity: 1.2 mV
Zone Setting Status: 755011

## 2023-04-04 ENCOUNTER — Other Ambulatory Visit: Payer: Self-pay | Admitting: Family Medicine

## 2023-04-04 DIAGNOSIS — C4359 Malignant melanoma of other part of trunk: Secondary | ICD-10-CM | POA: Diagnosis not present

## 2023-04-05 NOTE — Telephone Encounter (Signed)
 Requested medications are due for refill today.  yes  Requested medications are on the active medications list.  yes  Last refill. 12/29/2022 #60 3 rf  Future visit scheduled.   no  Notes to clinic.  Pt is over due for a CPE.    Requested Prescriptions  Pending Prescriptions Disp Refills   levETIRAcetam  (KEPPRA ) 500 MG tablet [Pharmacy Med Name: LEVETIRACETAM  500MG  TABLETS] 60 tablet 3    Sig: TAKE 1 TABLET(500 MG) BY MOUTH TWICE DAILY     Neurology:  Anticonvulsants - levetiracetam  Failed - 04/05/2023  2:24 PM      Failed - Completed PHQ-2 or PHQ-9 in the last 360 days      Failed - Valid encounter within last 12 months    Recent Outpatient Visits           1 year ago Essential hypertension   [facility] Duanne, Butler DASEN, MD   2 years ago Hematuria, unspecified type   [facility] Duanne Butler DASEN, MD   2 years ago Paroxysmal atrial fibrillation [facility])   [facility] Duanne Butler DASEN, MD   2 years ago Paroxysmal atrial fibrillation [facility])   [facility] Duanne Butler DASEN, MD   2 years ago Herpes zoster without complication   [facility] Chandra Harlene LABOR, NP              Passed - Cr in normal range and within 360 days    Creat  Date Value Ref Range Status  03/29/2022 1.14 0.70 - 1.22 mg/dL Final   Creatinine, Ser  Date Value Ref Range Status  05/30/2022 0.86 0.61 - 1.24 mg/dL Final

## 2023-04-15 DIAGNOSIS — C4359 Malignant melanoma of other part of trunk: Secondary | ICD-10-CM | POA: Diagnosis not present

## 2023-04-15 DIAGNOSIS — C439 Malignant melanoma of skin, unspecified: Secondary | ICD-10-CM | POA: Diagnosis not present

## 2023-04-20 NOTE — Telephone Encounter (Signed)
Copied from CRM [id number]. Topic: Clinical - Prescription Issue >> Apr 20, 2023  9:52 AM Patsy Lager T wrote: Reason for CRM: patient called stated when he goes to the pharmacy they are only giving him 5 pills at a time. Please f/u with patient or the pharmacy.  [facility] [phone] - [place], [place] - [address] AT [facility] [address]  Phone: [phone] Fax: [phone] >> Apr 20, 2023 10:09 AM Patsy Lager T wrote:  patient called stated when he goes to the pharmacy they are only giving him 5 pills of the rivaroxaban (XARELTO) 20 MG TABS tablet at a time. Please f/u with patient or the pharmacy.    [facility] [phone] - [place], [place] - [address] AT [facility] [address]    Phone: [phone]    Fax: [phone]

## 2023-04-21 ENCOUNTER — Other Ambulatory Visit: Payer: Self-pay | Admitting: Family Medicine

## 2023-04-25 ENCOUNTER — Other Ambulatory Visit: Payer: Self-pay | Admitting: Cardiology

## 2023-04-25 ENCOUNTER — Other Ambulatory Visit: Payer: Self-pay | Admitting: Family Medicine

## 2023-04-25 DIAGNOSIS — I480 Paroxysmal atrial fibrillation: Secondary | ICD-10-CM

## 2023-04-25 DIAGNOSIS — E785 Hyperlipidemia, unspecified: Secondary | ICD-10-CM

## 2023-04-25 NOTE — Telephone Encounter (Signed)
Xarelto 20mg  refill request received. Pt is 89 years old, weight-82.1kg, Crea- 0.86 on 05/30/22, last seen by Dr. Ladona Ridgel on 12/03/22, Diagnosis-Afib, CrCl- 70.27 mL/min; Dose is appropriate based on dosing criteria. Will send in refill to requested pharmacy.

## 2023-05-03 ENCOUNTER — Other Ambulatory Visit: Payer: Self-pay | Admitting: Family Medicine

## 2023-05-03 NOTE — Telephone Encounter (Signed)
Requested medication (s) are due for refill today: yes   Requested medication (s) are on the active medication list: yes   Last refill:  cozaar- 06/17/22 #90 3 refills, norvasc-10/21/22 #90 1 refills, keppra-04/21/23 #60 3 refills  Future visit scheduled: no   Notes to clinic:  overdue OV . Last OV 03/29/22. Do you want to scheduled CPE? Do you want to give #30 day supply until appt scheduled?     Requested Prescriptions  Pending Prescriptions Disp Refills   losartan (COZAAR) 100 MG tablet [Pharmacy Med Name: LOSARTAN 100MG  TABLETS] 90 tablet 3    Sig: TAKE 1/2 TABLET BY MOUTH EVERY DAY     Cardiovascular:  Angiotensin Receptor Blockers Failed - 05/03/2023  2:29 PM      Failed - Cr in normal range and within 180 days    Creat  Date Value Ref Range Status  03/29/2022 1.14 0.70 - 1.22 mg/dL Final   Creatinine, Ser  Date Value Ref Range Status  05/30/2022 0.86 0.61 - 1.24 mg/dL Final         Failed - K in normal range and within 180 days    Potassium  Date Value Ref Range Status  05/30/2022 3.9 3.5 - 5.1 mmol/L Final         Failed - Last BP in normal range    BP Readings from Last 1 Encounters:  12/03/22 (!) 180/90         Failed - Valid encounter within last 6 months    Recent Outpatient Visits           1 year ago Essential hypertension   [facility] Donita Brooks, MD   2 years ago Hematuria, unspecified type   [facility] Donita Brooks, MD   2 years ago Paroxysmal atrial fibrillation (HCC)   [facility] Donita Brooks, MD   2 years ago Paroxysmal atrial fibrillation (HCC)   [facility] Donita Brooks, MD   2 years ago Herpes zoster without complication   [facility] Valentino Nose, NP              Passed - Patient is not pregnant       amLODipine (NORVASC) 10 MG tablet [Pharmacy Med Name: AMLODIPINE BESYLATE 10MG  TABLETS] 90 tablet 1    Sig: TAKE 1  TABLET(10 MG) BY MOUTH DAILY     Cardiovascular: Calcium Channel Blockers 2 Failed - 05/03/2023  2:29 PM      Failed - Last BP in normal range    BP Readings from Last 1 Encounters:  12/03/22 (!) 180/90         Failed - Valid encounter within last 6 months    Recent Outpatient Visits           1 year ago Essential hypertension   [facility] Donita Brooks, MD   2 years ago Hematuria, unspecified type   [facility] Donita Brooks, MD   2 years ago Paroxysmal atrial fibrillation [facility])   [facility] Donita Brooks, MD   2 years ago Paroxysmal atrial fibrillation [facility])   [facility] Donita Brooks, MD   2 years ago Herpes zoster without complication   [facility] Valentino Nose, NP              Passed - Last Heart Rate in normal range  Pulse Readings from Last 1 Encounters:  05/30/22 (!) 59          levETIRAcetam (KEPPRA) 500 MG tablet [Pharmacy Med Name: LEVETIRACETAM 500MG  TABLETS] 60 tablet 3    Sig: TAKE 1 TABLET(500 MG) BY MOUTH TWICE DAILY     Neurology:  Anticonvulsants - levetiracetam Failed - 05/03/2023  2:29 PM      Failed - Completed PHQ-2 or PHQ-9 in the last 360 days      Failed - Valid encounter within last 12 months    Recent Outpatient Visits           1 year ago Essential hypertension   [facility] Tanya Nones, Priscille Heidelberg, MD   2 years ago Hematuria, unspecified type   [facility] Donita Brooks, MD   2 years ago Paroxysmal atrial fibrillation [facility])   [facility] Donita Brooks, MD   2 years ago Paroxysmal atrial fibrillation [facility])   [facility] Donita Brooks, MD   2 years ago Herpes zoster without complication   [facility] Valentino Nose, NP              Passed - Cr in normal range and within 360 days    Creat  Date Value Ref Range Status   03/29/2022 1.14 0.70 - 1.22 mg/dL Final   Creatinine, Ser  Date Value Ref Range Status  05/30/2022 0.86 0.61 - 1.24 mg/dL Final

## 2023-05-09 DIAGNOSIS — C4359 Malignant melanoma of other part of trunk: Secondary | ICD-10-CM | POA: Diagnosis not present

## 2023-05-09 DIAGNOSIS — Z79899 Other long term (current) drug therapy: Secondary | ICD-10-CM | POA: Diagnosis not present

## 2023-05-09 DIAGNOSIS — R911 Solitary pulmonary nodule: Secondary | ICD-10-CM | POA: Diagnosis not present

## 2023-05-09 NOTE — Telephone Encounter (Signed)
2nd request sent by NT  Requested medication (s) are due for refill today: yes    Requested medication (s) are on the active medication list: yes    Last refill:  cozaar- 06/17/22 #90 3 refills, norvasc-10/21/22 #90 1 refills, keppra-04/21/23 #60 3 refills   Future visit scheduled: no    Notes to clinic:  overdue OV . Last OV 03/29/22. Do you want to scheduled CPE? Do you want to give #30 day supply until appt scheduled?   Requested Prescriptions  Pending Prescriptions Disp Refills   losartan (COZAAR) 100 MG tablet [Pharmacy Med Name: LOSARTAN 100MG  TABLETS] 90 tablet 3    Sig: TAKE 1/2 TABLET BY MOUTH EVERY DAY     Cardiovascular:  Angiotensin Receptor Blockers Failed - 05/09/2023 10:09 AM      Failed - Cr in normal range and within 180 days    Creat  Date Value Ref Range Status  03/29/2022 1.14 0.70 - 1.22 mg/dL Final   Creatinine, Ser  Date Value Ref Range Status  05/30/2022 0.86 0.61 - 1.24 mg/dL Final         Failed - K in normal range and within 180 days    Potassium  Date Value Ref Range Status  05/30/2022 3.9 3.5 - 5.1 mmol/L Final         Failed - Last BP in normal range    BP Readings from Last 1 Encounters:  12/03/22 (!) 180/90         Failed - Valid encounter within last 6 months    Recent Outpatient Visits           2 years ago Essential hypertension   [facility] Donita Brooks, MD   2 years ago Hematuria, unspecified type   [facility] Donita Brooks, MD   2 years ago Paroxysmal atrial fibrillation (HCC)   [facility] Donita Brooks, MD   2 years ago Paroxysmal atrial fibrillation (HCC)   [facility] Donita Brooks, MD   2 years ago Herpes zoster without complication   [facility] Valentino Nose, NP              Passed - Patient is not pregnant       amLODipine (NORVASC) 10 MG tablet [Pharmacy Med Name: AMLODIPINE BESYLATE 10MG  TABLETS] 90 tablet  1    Sig: TAKE 1 TABLET(10 MG) BY MOUTH DAILY     Cardiovascular: Calcium Channel Blockers 2 Failed - 05/09/2023 10:09 AM      Failed - Last BP in normal range    BP Readings from Last 1 Encounters:  12/03/22 (!) 180/90         Failed - Valid encounter within last 6 months    Recent Outpatient Visits           2 years ago Essential hypertension   [facility] Donita Brooks, MD   2 years ago Hematuria, unspecified type   [facility] Donita Brooks, MD   2 years ago Paroxysmal atrial fibrillation [facility])   [facility] Donita Brooks, MD   2 years ago Paroxysmal atrial fibrillation [facility])   [facility] Donita Brooks, MD   2 years ago Herpes zoster without complication   [facility] Valentino Nose, NP              Passed - Last Heart  Rate in normal range    Pulse Readings from Last 1 Encounters:  05/30/22 (!) 59          levETIRAcetam (KEPPRA) 500 MG tablet [Pharmacy Med Name: LEVETIRACETAM 500MG  TABLETS] 60 tablet 3    Sig: TAKE 1 TABLET(500 MG) BY MOUTH TWICE DAILY     Neurology:  Anticonvulsants - levetiracetam Failed - 05/09/2023 10:09 AM      Failed - Completed PHQ-2 or PHQ-9 in the last 360 days      Failed - Valid encounter within last 12 months    Recent Outpatient Visits           2 years ago Essential hypertension   [facility] Tanya Nones, Priscille Heidelberg, MD   2 years ago Hematuria, unspecified type   [facility] Donita Brooks, MD   2 years ago Paroxysmal atrial fibrillation [facility])   [facility] Donita Brooks, MD   2 years ago Paroxysmal atrial fibrillation [facility])   [facility] Donita Brooks, MD   2 years ago Herpes zoster without complication   [facility] Valentino Nose, NP              Passed - Cr in normal range and within 360 days    Creat  Date Value Ref  Range Status  03/29/2022 1.14 0.70 - 1.22 mg/dL Final   Creatinine, Ser  Date Value Ref Range Status  05/30/2022 0.86 0.61 - 1.24 mg/dL Final

## 2023-05-09 NOTE — Telephone Encounter (Addendum)
Received second refills requested from pharmacy for   losartan (COZAAR) 100 MG tablet   amLODipine (NORVASC) 10 MG tablet [[id number]]   levETIRAcetam (KEPPRA) 500 MG tablet [[id number]]   LOV: 03/29/2022 **has appt 05/12/23 for med refills**                                                                                           Pharmacy:   [facility] [phone] - [place], [place] - [address] AT [facility] [address], [place] [address] Phone: [phone]  Fax: [phone] DEA #: [id number]   Please advise pharmacist.

## 2023-05-10 DIAGNOSIS — E039 Hypothyroidism, unspecified: Secondary | ICD-10-CM | POA: Diagnosis not present

## 2023-05-10 DIAGNOSIS — I441 Atrioventricular block, second degree: Secondary | ICD-10-CM | POA: Diagnosis not present

## 2023-05-10 DIAGNOSIS — I5031 Acute diastolic (congestive) heart failure: Secondary | ICD-10-CM | POA: Diagnosis not present

## 2023-05-10 DIAGNOSIS — I779 Disorder of arteries and arterioles, unspecified: Secondary | ICD-10-CM | POA: Diagnosis not present

## 2023-05-10 DIAGNOSIS — Z950 Presence of cardiac pacemaker: Secondary | ICD-10-CM | POA: Diagnosis not present

## 2023-05-10 DIAGNOSIS — I2510 Atherosclerotic heart disease of native coronary artery without angina pectoris: Secondary | ICD-10-CM | POA: Diagnosis not present

## 2023-05-10 DIAGNOSIS — I480 Paroxysmal atrial fibrillation: Secondary | ICD-10-CM | POA: Diagnosis not present

## 2023-05-10 DIAGNOSIS — I10 Essential (primary) hypertension: Secondary | ICD-10-CM | POA: Diagnosis not present

## 2023-05-10 DIAGNOSIS — Z8673 Personal history of transient ischemic attack (TIA), and cerebral infarction without residual deficits: Secondary | ICD-10-CM | POA: Diagnosis not present

## 2023-05-10 DIAGNOSIS — I442 Atrioventricular block, complete: Secondary | ICD-10-CM | POA: Diagnosis not present

## 2023-05-10 DIAGNOSIS — C439 Malignant melanoma of skin, unspecified: Secondary | ICD-10-CM | POA: Insufficient documentation

## 2023-05-10 DIAGNOSIS — F3340 Major depressive disorder, recurrent, in remission, unspecified: Secondary | ICD-10-CM | POA: Insufficient documentation

## 2023-05-10 DIAGNOSIS — C4359 Malignant melanoma of other part of trunk: Secondary | ICD-10-CM | POA: Diagnosis not present

## 2023-05-10 DIAGNOSIS — E118 Type 2 diabetes mellitus with unspecified complications: Secondary | ICD-10-CM | POA: Diagnosis not present

## 2023-05-11 NOTE — Progress Notes (Signed)
 Remote pacemaker transmission.

## 2023-05-12 ENCOUNTER — Telehealth: Payer: Medicare Other | Admitting: Family Medicine

## 2023-05-12 ENCOUNTER — Ambulatory Visit: Payer: Medicare Other | Admitting: Family Medicine

## 2023-05-12 DIAGNOSIS — I10 Essential (primary) hypertension: Secondary | ICD-10-CM | POA: Diagnosis not present

## 2023-05-12 DIAGNOSIS — G40909 Epilepsy, unspecified, not intractable, without status epilepticus: Secondary | ICD-10-CM | POA: Diagnosis not present

## 2023-05-12 DIAGNOSIS — E039 Hypothyroidism, unspecified: Secondary | ICD-10-CM

## 2023-05-12 MED ORDER — AMLODIPINE BESYLATE 10 MG PO TABS: 10.0000 mg | ORAL_TABLET | Freq: Every day | ORAL | 1 refills | Status: DC

## 2023-05-12 MED ORDER — LEVETIRACETAM 500 MG PO TABS: 500.0000 mg | ORAL_TABLET | Freq: Two times a day (BID) | ORAL | 3 refills | Status: DC

## 2023-05-12 MED ORDER — CARBAMAZEPINE ER 300 MG PO CP12: 300.0000 mg | ORAL_CAPSULE | Freq: Two times a day (BID) | ORAL | 3 refills | Status: DC

## 2023-05-12 NOTE — Progress Notes (Signed)
Subjective:    Patient ID: [name], male    DOB: 03/09/1934, 89 y.o.   MRN: [id number] Patient is being seen today as a video visit.  He is currently at home with his wife who is bedbound.  I am currently in my office.  Patient consents to be seen via video.  Phone call began at 238.  It concluded at 246.  He is requesting refills on Tegretol and Keppra as well as amlodipine.  He is on Tegretol and Keppra for seizure prophylaxis.  He denies any seizure activity.  He denies any staring spells.  He denies any dizziness or confusion.  He takes amlodipine for hypertension.  He states that he checks his blood pressure at home and it is typically 120/80 or in that range.  He denies any chest pain or shortness of breath or dyspnea on exertion.  He denies any syncope or near syncope.  He is long overdue for lab work.  Last CBC was obtained at an outside hospital 3 weeks ago and was significant for a hemoglobin of 11.  Otherwise CBC was normal.  Patient has not had a CMP or TSH in quite some time. Past Medical History:  Diagnosis Date   Atrial flutter (HCC)    BPH (benign prostatic hyperplasia)    Coronary artery disease    a. s/p MI tx with Cypher DES to pRCA in 4/04;  b. Echocardiogram 7/09: Normal LV function.  c. Nuclear study 3/13 no ischemia;  d. ETT 2/14 neg;  e. admx with CP => LHC (01/30/2013):  pLAD 40-50%, oD1 70-80%, oOM1 40%, pRCA stent patent, mid RCA 30%, EF 60-65%. => med Rx.   DDD (degenerative disc disease)    Diabetes mellitus without complication (HCC)    Dyslipidemia    Gait disorder 07/13/2018   Hemorrhoids    Hx MRSA infection    left buttocks abscess   Hx of echocardiogram    a. Echocardiogram (01/31/2013): Mild focal basal and mild concentric hypertrophy of the septum, EF 50-55%, normal wall motion, grade 1 diastolic dysfunction, trivial AI, MAC, mild LAE, PASP 35   Hyperlipidemia    Hypertension    Hypothyroidism    Meniere's disease    Status post shunt   Myocardial  infarction [facility]) 2008   Occlusion and stenosis of carotid artery without mention of cerebral infarction    40-59% on carotid doppler 2014; [place] (01/2013): R 1-39%, L 60-79%   Rotator cuff injury    chronic rotator cuff injury status post repair   Seizure disorder (HCC)    Stroke (HCC)    a. 01/2013=> post cardiac cath CVA to L post communicating artery system; R sided weakness   Syncope    Past Surgical History:  Procedure Laterality Date   CARDIOVERSION N/A 05/26/2022   Procedure: CARDIOVERSION;  Surgeon: Meriam Sprague, MD;  Location: [facility] ENDOSCOPY;  Service: Cardiovascular;  Laterality: N/A;   COLONOSCOPY     CORONARY ANGIOPLASTY WITH STENT PLACEMENT  07/14/2002   Stent to the right coronary artery   ENDOLYMPHATIC SHUNT DECOMPRESSION  06/11/2009    Right endolymphatic sac decompression and shunt  placement   HERNIA REPAIR  04/10/2008   scrotal hernia repair   LEFT HEART CATHETERIZATION WITH CORONARY ANGIOGRAM N/A 01/30/2013   Procedure: LEFT HEART CATHETERIZATION WITH CORONARY ANGIOGRAM;  Surgeon: Laurey Morale, MD;  Location: [facility] CATH LAB;  Service: Cardiovascular;  Laterality: N/A;   PACEMAKER IMPLANT N/A 08/29/2020   Procedure: PACEMAKER IMPLANT;  Surgeon: Marinus Maw, MD;  Location: [facility] INVASIVE CV LAB;  Service: Cardiovascular;  Laterality: N/A;   ROTATOR CUFF REPAIR     for chronic rotator cuff injury   TEE WITHOUT CARDIOVERSION N/A 05/26/2022   Procedure: TRANSESOPHAGEAL ECHOCARDIOGRAM (TEE);  Surgeon: Meriam Sprague, MD;  Location: [facility] ENDOSCOPY;  Service: Cardiovascular;  Laterality: N/A;   Current Outpatient Medications on File Prior to Visit  Medication Sig Dispense Refill   acetaminophen (TYLENOL) 325 MG tablet Take 1-2 tablets (325-650 mg total) by mouth every 4 (four) hours as needed for mild pain.     atorvastatin (LIPITOR) 80 MG tablet TAKE 1 TABLET BY MOUTH EVERY DAY AT 6 PM 90 tablet 3   cloNIDine (CATAPRES) 0.2 MG tablet TAKE 1 TABLET(0.2 MG) BY MOUTH  TWICE DAILY 180 tablet 3   donepezil (ARICEPT) 5 MG tablet TAKE 1 TABLET(5 MG) BY MOUTH AT BEDTIME 90 tablet 1   escitalopram (LEXAPRO) 5 MG tablet TAKE 1 TABLET BY MOUTH EVERY DAY AT BEDTIME 90 tablet 2   furosemide (LASIX) 20 MG tablet TAKE 1/2 TABLET(10 MG) BY MOUTH DAILY 45 tablet 3   glucose blood test strip 1 each by Other route as needed. Use as instructed 100 each 3   glucose monitoring kit (FREESTYLE) monitoring kit 1 each by Does not apply route as needed. 1 each 0   HYDROcodone-acetaminophen (NORCO/VICODIN) 5-325 MG tablet Take 1 tablet by mouth every 6 (six) hours as needed. 12 tablet 0   isosorbide mononitrate (IMDUR) 60 MG 24 hr tablet TAKE 1/2 TABLET BY MOUTH EVERY DAY 90 tablet 3   Lancets (ACCU-CHEK SAFE-T PRO) lancets 1 each by Other route as needed. Use as instructed 100 each 3   levothyroxine (SYNTHROID) 88 MCG tablet TAKE 1 TABLET(88 MCG) BY MOUTH DAILY 90 tablet 3   losartan (COZAAR) 100 MG tablet TAKE 1/2 TABLET BY MOUTH EVERY DAY 90 tablet 3   Multiple Vitamins-Minerals (MULTIVITAMINS THER. W/MINERALS) TABS Take 1 tablet by mouth at bedtime.       MYRBETRIQ 50 MG TB24 tablet Take 50 mg by mouth daily.     predniSONE (DELTASONE) 20 MG tablet 3 tabs poqday 1-2, 2 tabs poqday 3-4, 1 tab poqday 5-6 12 tablet 0   tamsulosin (FLOMAX) 0.4 MG CAPS capsule TAKE 1 CAPSULE BY MOUTH DAILY AFTER SUPPER. 90 capsule 3   XARELTO 20 MG TABS tablet TAKE 1 TABLET(20 MG) BY MOUTH DAILY WITH SUPPER 90 tablet 1   No current facility-administered medications on file prior to visit.     Allergies  Allergen Reactions   Penicillins     Did it involve swelling of the face/tongue/throat, SOB, or low BP? Unknown Did it involve sudden or severe rash/hives, skin peeling, or any reaction on the inside of your mouth or nose? Unknown Did you need to seek medical attention at a hospital or doctor's office? No When did it last happen?      Decades Ago If all above answers are "NO", may proceed with  cephalosporin use.     Social History   Socioeconomic History   Marital status: Married    Spouse name: Not on file   Number of children: 4   Years of education: 12th   Highest education level: Not on file  Occupational History   Occupation: Retired  Tobacco Use   Smoking status: Former    Current packs/day: 0.00    Types: Cigarettes    Start date: 08/20/1951    Quit date: 08/19/1956  Years since quitting: 66.7   Smokeless tobacco: Former    Types: Associate Professor status: Never Used  Substance and Sexual Activity   Alcohol use: No   Drug use: No   Sexual activity: Never  Other Topics Concern   Not on file  Social History Narrative   Lives with wife.   Children live nearby and help with care.    Social Drivers of Health   Financial Resource Strain: High Risk (03/12/2022)   Overall Financial Resource Strain (CARDIA)    Difficulty of Paying Living Expenses: Hard  Food Insecurity: No Food Insecurity (03/12/2022)   Hunger Vital Sign    Worried About Running Out of Food in the Last Year: Never true    Ran Out of Food in the Last Year: Never true  Transportation Needs: No Transportation Needs (03/12/2022)   PRAPARE - Administrator, Civil Service (Medical): No    Lack of Transportation (Non-Medical): No  Physical Activity: Inactive (03/12/2022)   Exercise Vital Sign    Days of Exercise per Week: 2 days    Minutes of Exercise per Session: 0 min  Stress: No Stress Concern Present (03/12/2022)   [employer] of Occupational Health - Occupational Stress Questionnaire    Feeling of Stress : Not at all  Social Connections: Moderately Integrated (03/12/2022)   Social Connection and Isolation Panel [NHANES]    Frequency of Communication with Friends and Family: More than three times a week    Frequency of Social Gatherings with Friends and Family: More than three times a week    Attends Religious Services: Never    Database administrator or  Organizations: Yes    Attends Banker Meetings: 1 to 4 times per year    Marital Status: Married  Catering manager Violence: Not At Risk (03/12/2022)   Humiliation, Afraid, Rape, and Kick questionnaire    Fear of Current or Ex-Partner: No    Emotionally Abused: No    Physically Abused: No    Sexually Abused: No     Review of Systems  All other systems reviewed and are negative.      Objective:    Physical exam could not be performed as this was a video visit    Assessment & Plan:  Primary hypertension  Seizure disorder (HCC) - Plan: carbamazepine (CARBATROL) 300 MG 12 hr capsule  Hypothyroidism, unspecified type  I asked the patient to come by for a CMP and a TSH so we could update his lab work given that he is on Lasix to monitor his renal function and potassium.  I also want to check the TSH to ensure dosage of levothyroxine.  He states that his blood pressure at home is well-controlled.  He is on anticoagulation for atrial fibrillation however recent CBC showed no severe anemia.  He is seizure-free and therefore I will not make any changes to his dose of Tegretol or Keppra

## 2023-05-24 DIAGNOSIS — I7389 Other specified peripheral vascular diseases: Secondary | ICD-10-CM | POA: Diagnosis not present

## 2023-05-24 DIAGNOSIS — Z09 Encounter for follow-up examination after completed treatment for conditions other than malignant neoplasm: Secondary | ICD-10-CM | POA: Diagnosis not present

## 2023-05-24 DIAGNOSIS — G939 Disorder of brain, unspecified: Secondary | ICD-10-CM | POA: Diagnosis not present

## 2023-05-24 DIAGNOSIS — Z950 Presence of cardiac pacemaker: Secondary | ICD-10-CM | POA: Diagnosis not present

## 2023-05-24 DIAGNOSIS — C4359 Malignant melanoma of other part of trunk: Secondary | ICD-10-CM | POA: Diagnosis not present

## 2023-05-24 DIAGNOSIS — Z8673 Personal history of transient ischemic attack (TIA), and cerebral infarction without residual deficits: Secondary | ICD-10-CM | POA: Diagnosis not present

## 2023-06-01 DIAGNOSIS — C4359 Malignant melanoma of other part of trunk: Secondary | ICD-10-CM | POA: Diagnosis not present

## 2023-06-01 DIAGNOSIS — C439 Malignant melanoma of skin, unspecified: Secondary | ICD-10-CM | POA: Diagnosis not present

## 2023-06-06 ENCOUNTER — Other Ambulatory Visit: Payer: Self-pay

## 2023-06-06 ENCOUNTER — Telehealth: Payer: Self-pay

## 2023-06-06 DIAGNOSIS — I10 Essential (primary) hypertension: Secondary | ICD-10-CM

## 2023-06-06 DIAGNOSIS — E118 Type 2 diabetes mellitus with unspecified complications: Secondary | ICD-10-CM

## 2023-06-06 DIAGNOSIS — G40909 Epilepsy, unspecified, not intractable, without status epilepticus: Secondary | ICD-10-CM

## 2023-06-06 DIAGNOSIS — I2510 Atherosclerotic heart disease of native coronary artery without angina pectoris: Secondary | ICD-10-CM

## 2023-06-06 DIAGNOSIS — E785 Hyperlipidemia, unspecified: Secondary | ICD-10-CM

## 2023-06-06 DIAGNOSIS — G8191 Hemiplegia, unspecified affecting right dominant side: Secondary | ICD-10-CM

## 2023-06-06 DIAGNOSIS — I5031 Acute diastolic (congestive) heart failure: Secondary | ICD-10-CM

## 2023-06-06 DIAGNOSIS — R319 Hematuria, unspecified: Secondary | ICD-10-CM

## 2023-06-06 MED ORDER — MYRBETRIQ 50 MG PO TB24: 50.0000 mg | ORAL_TABLET | Freq: Every day | ORAL | 0 refills | Status: DC

## 2023-06-06 MED ORDER — CARBAMAZEPINE ER 300 MG PO CP12: 300.0000 mg | ORAL_CAPSULE | Freq: Two times a day (BID) | ORAL | 3 refills | Status: DC

## 2023-06-06 MED ORDER — DONEPEZIL HCL 5 MG PO TABS: 5.0000 mg | ORAL_TABLET | Freq: Every day | ORAL | 1 refills | Status: DC

## 2023-06-06 MED ORDER — ISOSORBIDE MONONITRATE ER 60 MG PO TB24: 30.0000 mg | ORAL_TABLET | Freq: Every day | ORAL | 3 refills | Status: DC

## 2023-06-06 MED ORDER — ATORVASTATIN CALCIUM 80 MG PO TABS: ORAL_TABLET | ORAL | 3 refills | Status: DC

## 2023-06-06 MED ORDER — FUROSEMIDE 20 MG PO TABS: ORAL_TABLET | ORAL | 3 refills | Status: DC

## 2023-06-06 MED ORDER — ESCITALOPRAM OXALATE 5 MG PO TABS
5.0000 mg | ORAL_TABLET | Freq: Every day | ORAL | 2 refills | Status: DC
Start: 2023-06-06 — End: 2023-11-11

## 2023-06-06 MED ORDER — TAMSULOSIN HCL 0.4 MG PO CAPS: 0.4000 mg | ORAL_CAPSULE | Freq: Every day | ORAL | 3 refills | Status: DC

## 2023-06-06 MED ORDER — LEVETIRACETAM 500 MG PO TABS: 500.0000 mg | ORAL_TABLET | Freq: Two times a day (BID) | ORAL | 3 refills | Status: DC

## 2023-06-06 MED ORDER — LEVOTHYROXINE SODIUM 88 MCG PO TABS: 88.0000 ug | ORAL_TABLET | Freq: Every day | ORAL | 1 refills | Status: DC

## 2023-06-06 MED ORDER — AMLODIPINE BESYLATE 10 MG PO TABS: 10.0000 mg | ORAL_TABLET | Freq: Every day | ORAL | 1 refills | Status: DC

## 2023-06-06 MED ORDER — LOSARTAN POTASSIUM 100 MG PO TABS: 50.0000 mg | ORAL_TABLET | Freq: Every day | ORAL | 3 refills | Status: DC

## 2023-06-06 MED ORDER — CLONIDINE HCL 0.2 MG PO TABS: 0.2000 mg | ORAL_TABLET | Freq: Two times a day (BID) | ORAL | 3 refills | Status: DC

## 2023-06-06 NOTE — Telephone Encounter (Signed)
 Copied from CRM [id number]. Topic: Clinical - Prescription Issue >> Jun 06, 2023 10:32 AM Elle L wrote: Reason for CRM: The patient's daughter, Garen Grams, is requesting for all of the patient's prescriptions to go to [facility] Address: [address], [place], [place] [address] Phone: [phone]. I have added the pharmacy to their chart.

## 2023-06-07 DIAGNOSIS — C439 Malignant melanoma of skin, unspecified: Secondary | ICD-10-CM | POA: Diagnosis not present

## 2023-06-07 DIAGNOSIS — Z5112 Encounter for antineoplastic immunotherapy: Secondary | ICD-10-CM | POA: Diagnosis not present

## 2023-06-07 DIAGNOSIS — Z79899 Other long term (current) drug therapy: Secondary | ICD-10-CM | POA: Diagnosis not present

## 2023-06-07 DIAGNOSIS — C4359 Malignant melanoma of other part of trunk: Secondary | ICD-10-CM | POA: Diagnosis not present

## 2023-06-28 DIAGNOSIS — C439 Malignant melanoma of skin, unspecified: Secondary | ICD-10-CM | POA: Diagnosis not present

## 2023-06-28 DIAGNOSIS — C4359 Malignant melanoma of other part of trunk: Secondary | ICD-10-CM | POA: Diagnosis not present

## 2023-06-28 DIAGNOSIS — Z79899 Other long term (current) drug therapy: Secondary | ICD-10-CM | POA: Diagnosis not present

## 2023-06-28 DIAGNOSIS — Z5112 Encounter for antineoplastic immunotherapy: Secondary | ICD-10-CM | POA: Diagnosis not present

## 2023-06-28 DIAGNOSIS — E039 Hypothyroidism, unspecified: Secondary | ICD-10-CM | POA: Diagnosis not present

## 2023-06-28 DIAGNOSIS — D649 Anemia, unspecified: Secondary | ICD-10-CM | POA: Diagnosis not present

## 2023-06-29 ENCOUNTER — Ambulatory Visit (INDEPENDENT_AMBULATORY_CARE_PROVIDER_SITE_OTHER): Payer: Medicare Other

## 2023-06-29 DIAGNOSIS — I441 Atrioventricular block, second degree: Secondary | ICD-10-CM

## 2023-06-29 LAB — CUP PACEART REMOTE DEVICE CHECK
Battery Remaining Longevity: 119 mo
Battery Voltage: 3.02 V
Brady Statistic AP VP Percent: 24.46 %
Brady Statistic AP VS Percent: 0 %
Brady Statistic AS VP Percent: 75.46 %
Brady Statistic AS VS Percent: 0.08 %
Brady Statistic RA Percent Paced: 24.48 %
Brady Statistic RV Percent Paced: 99.92 %
Date Time Interrogation Session: 20250409011617
Implantable Lead Connection Status: 753985
Implantable Lead Connection Status: 753985
Implantable Lead Implant Date: 20220610
Implantable Lead Implant Date: 20220610
Implantable Lead Location: 753859
Implantable Lead Location: 753860
Implantable Lead Model: 3830
Implantable Lead Model: 5076
Implantable Pulse Generator Implant Date: 20220610
Lead Channel Impedance Value: 247 Ohm
Lead Channel Impedance Value: 266 Ohm
Lead Channel Impedance Value: 304 Ohm
Lead Channel Impedance Value: 437 Ohm
Lead Channel Pacing Threshold Amplitude: 0.75 V
Lead Channel Pacing Threshold Amplitude: 1 V
Lead Channel Pacing Threshold Pulse Width: 0.4 ms
Lead Channel Pacing Threshold Pulse Width: 0.4 ms
Lead Channel Sensing Intrinsic Amplitude: 1.375 mV
Lead Channel Sensing Intrinsic Amplitude: 1.375 mV
Lead Channel Sensing Intrinsic Amplitude: 29.25 mV
Lead Channel Sensing Intrinsic Amplitude: 29.25 mV
Lead Channel Setting Pacing Amplitude: 1.5 V
Lead Channel Setting Pacing Amplitude: 2 V
Lead Channel Setting Pacing Pulse Width: 0.4 ms
Lead Channel Setting Sensing Sensitivity: 1.2 mV
Zone Setting Status: 755011

## 2023-07-05 ENCOUNTER — Encounter: Payer: Self-pay | Admitting: Internal Medicine

## 2023-07-15 DIAGNOSIS — C438 Malignant melanoma of overlapping sites of skin: Secondary | ICD-10-CM | POA: Diagnosis not present

## 2023-07-15 DIAGNOSIS — D1721 Benign lipomatous neoplasm of skin and subcutaneous tissue of right arm: Secondary | ICD-10-CM | POA: Diagnosis not present

## 2023-07-19 DIAGNOSIS — E039 Hypothyroidism, unspecified: Secondary | ICD-10-CM | POA: Diagnosis not present

## 2023-07-19 DIAGNOSIS — Z79899 Other long term (current) drug therapy: Secondary | ICD-10-CM | POA: Diagnosis not present

## 2023-07-19 DIAGNOSIS — C4359 Malignant melanoma of other part of trunk: Secondary | ICD-10-CM | POA: Diagnosis not present

## 2023-07-19 DIAGNOSIS — C439 Malignant melanoma of skin, unspecified: Secondary | ICD-10-CM | POA: Diagnosis not present

## 2023-07-19 DIAGNOSIS — Z5112 Encounter for antineoplastic immunotherapy: Secondary | ICD-10-CM | POA: Diagnosis not present

## 2023-07-19 DIAGNOSIS — D649 Anemia, unspecified: Secondary | ICD-10-CM | POA: Diagnosis not present

## 2023-07-26 ENCOUNTER — Other Ambulatory Visit

## 2023-07-26 DIAGNOSIS — E118 Type 2 diabetes mellitus with unspecified complications: Secondary | ICD-10-CM

## 2023-07-26 DIAGNOSIS — I10 Essential (primary) hypertension: Secondary | ICD-10-CM

## 2023-07-26 DIAGNOSIS — E785 Hyperlipidemia, unspecified: Secondary | ICD-10-CM

## 2023-07-26 DIAGNOSIS — E039 Hypothyroidism, unspecified: Secondary | ICD-10-CM

## 2023-07-28 ENCOUNTER — Other Ambulatory Visit: Payer: Self-pay

## 2023-07-28 LAB — COMPLETE METABOLIC PANEL WITHOUT GFR
AG Ratio: 2 (calc) (ref 1.0–2.5)
ALT: 15 U/L (ref 9–46)
AST: 22 U/L (ref 10–35)
Albumin: 4.3 g/dL (ref 3.6–5.1)
Alkaline phosphatase (APISO): 85 U/L (ref 35–144)
BUN/Creatinine Ratio: 27 (calc) — ABNORMAL HIGH (ref 6–22)
BUN: 28 mg/dL — ABNORMAL HIGH (ref 7–25)
CO2: 30 mmol/L (ref 20–32)
Calcium: 9 mg/dL (ref 8.6–10.3)
Chloride: 99 mmol/L (ref 98–110)
Creat: 1.03 mg/dL (ref 0.70–1.22)
Globulin: 2.1 g/dL (ref 1.9–3.7)
Glucose, Bld: 101 mg/dL — ABNORMAL HIGH (ref 65–99)
Potassium: 5.2 mmol/L (ref 3.5–5.3)
Sodium: 137 mmol/L (ref 135–146)
Total Bilirubin: 0.4 mg/dL (ref 0.2–1.2)
Total Protein: 6.4 g/dL (ref 6.1–8.1)

## 2023-07-28 LAB — CBC WITH DIFFERENTIAL/PLATELET
Absolute Lymphocytes: 1946 {cells}/uL (ref 850–3900)
Absolute Monocytes: 897 {cells}/uL (ref 200–950)
Basophils Absolute: 91 {cells}/uL (ref 0–200)
Basophils Relative: 1.2 %
Eosinophils Absolute: 661 {cells}/uL — ABNORMAL HIGH (ref 15–500)
Eosinophils Relative: 8.7 %
HCT: 33.6 % — ABNORMAL LOW (ref 38.5–50.0)
Hemoglobin: 11.1 g/dL — ABNORMAL LOW (ref 13.2–17.1)
MCH: 31 pg (ref 27.0–33.0)
MCHC: 33 g/dL (ref 32.0–36.0)
MCV: 93.9 fL (ref 80.0–100.0)
MPV: 10.9 fL (ref 7.5–12.5)
Monocytes Relative: 11.8 %
Neutro Abs: 4005 {cells}/uL (ref 1500–7800)
Neutrophils Relative %: 52.7 %
Platelets: 253 10*3/uL (ref 140–400)
RBC: 3.58 10*6/uL — ABNORMAL LOW (ref 4.20–5.80)
RDW: 14.2 % (ref 11.0–15.0)
Total Lymphocyte: 25.6 %
WBC: 7.6 10*3/uL (ref 3.8–10.8)

## 2023-07-28 LAB — PROTEIN / CREATININE RATIO, URINE
Creatinine, Urine: 77 mg/dL (ref 20–320)
Protein/Creat Ratio: 208 mg/g{creat} — ABNORMAL HIGH (ref 25–148)
Protein/Creatinine Ratio: 0.208 mg/mg{creat} — ABNORMAL HIGH (ref 0.025–0.148)
Total Protein, Urine: 16 mg/dL (ref 5–25)

## 2023-07-28 LAB — LIPID PANEL
Cholesterol: 198 mg/dL (ref <200)
HDL: 77 mg/dL (ref >=40)
LDL Cholesterol (Calc): 107 mg/dL — ABNORMAL HIGH
Non-HDL Cholesterol (Calc): 121 mg/dL (ref <130)
Total CHOL/HDL Ratio: 2.6 (calc) (ref <5.0)
Triglycerides: 58 mg/dL (ref <150)

## 2023-07-28 LAB — TSH: TSH: 5.19 m[IU]/L — ABNORMAL HIGH (ref 0.40–4.50)

## 2023-07-28 LAB — HEMOGLOBIN A1C
Hgb A1c MFr Bld: 6.3 % — ABNORMAL HIGH (ref <5.7)
Mean Plasma Glucose: 134 mg/dL
eAG (mmol/L): 7.4 mmol/L

## 2023-07-28 MED ORDER — LEVOTHYROXINE SODIUM 100 MCG PO TABS
100.0000 ug | ORAL_TABLET | Freq: Every day | ORAL | 3 refills | Status: DC
Start: 2023-07-28 — End: 2023-11-11

## 2023-08-12 NOTE — Progress Notes (Signed)
 Remote pacemaker transmission.

## 2023-09-21 ENCOUNTER — Telehealth: Payer: Self-pay

## 2023-09-21 NOTE — Telephone Encounter (Signed)
 Copied from CRM [id number]. Topic: Clinical - Medication Question >> Sep 21, 2023  2:55 PM Rosaria E wrote: Reason for CRM: Pt called with questions about their blood thinner, requesting to speak to the clinic.   Best contact: [phone] >> Sep 21, 2023  2:56 PM Rosaria BRAVO wrote: Also has a question about his pacemaker

## 2023-09-28 ENCOUNTER — Ambulatory Visit (INDEPENDENT_AMBULATORY_CARE_PROVIDER_SITE_OTHER): Payer: Medicare Other

## 2023-09-28 DIAGNOSIS — I441 Atrioventricular block, second degree: Secondary | ICD-10-CM | POA: Diagnosis not present

## 2023-09-29 ENCOUNTER — Ambulatory Visit: Payer: Self-pay | Admitting: Internal Medicine

## 2023-09-29 LAB — CUP PACEART REMOTE DEVICE CHECK
Battery Remaining Longevity: 115 mo
Battery Voltage: 3.02 V
Brady Statistic AP VP Percent: 33.29 %
Brady Statistic AP VS Percent: 0.01 %
Brady Statistic AS VP Percent: 66.56 %
Brady Statistic AS VS Percent: 0.14 %
Brady Statistic RA Percent Paced: 33.31 %
Brady Statistic RV Percent Paced: 99.85 %
Date Time Interrogation Session: 20250709040337
Implantable Lead Connection Status: 753985
Implantable Lead Connection Status: 753985
Implantable Lead Implant Date: 20220610
Implantable Lead Implant Date: 20220610
Implantable Lead Location: 753859
Implantable Lead Location: 753860
Implantable Lead Model: 3830
Implantable Lead Model: 5076
Implantable Pulse Generator Implant Date: 20220610
Lead Channel Impedance Value: 247 Ohm
Lead Channel Impedance Value: 266 Ohm
Lead Channel Impedance Value: 304 Ohm
Lead Channel Impedance Value: 437 Ohm
Lead Channel Pacing Threshold Amplitude: 0.625 V
Lead Channel Pacing Threshold Amplitude: 0.875 V
Lead Channel Pacing Threshold Pulse Width: 0.4 ms
Lead Channel Pacing Threshold Pulse Width: 0.4 ms
Lead Channel Sensing Intrinsic Amplitude: 1.625 mV
Lead Channel Sensing Intrinsic Amplitude: 1.625 mV
Lead Channel Sensing Intrinsic Amplitude: 28.375 mV
Lead Channel Sensing Intrinsic Amplitude: 28.375 mV
Lead Channel Setting Pacing Amplitude: 1.5 V
Lead Channel Setting Pacing Amplitude: 2 V
Lead Channel Setting Pacing Pulse Width: 0.4 ms
Lead Channel Setting Sensing Sensitivity: 1.2 mV
Zone Setting Status: 755011

## 2023-10-11 ENCOUNTER — Telehealth: Payer: Self-pay | Admitting: Family Medicine

## 2023-10-11 NOTE — Telephone Encounter (Signed)
 Patient requesting a Rx for a walker, routing for approval.

## 2023-10-11 NOTE — Telephone Encounter (Unsigned)
 Copied from CRM [id number]. Topic: Clinical - Medical Advice >> Oct 11, 2023 10:21 AM Donee H wrote: Reason for CRM: Patient's daughter Holley called stating patient's walker is broken. She went to the medical supply store and was told patient can receive a discounted price for new walker if doctor wrote a prescription stating patient needs walker. She is wanting to see if this is possible and would like to have a paper copy to take it to store. Please follow up with her on request at [phone]

## 2023-10-12 NOTE — Telephone Encounter (Signed)
 Lvm for Pam.

## 2023-10-21 ENCOUNTER — Other Ambulatory Visit: Payer: Self-pay | Admitting: Cardiology

## 2023-10-21 DIAGNOSIS — I480 Paroxysmal atrial fibrillation: Secondary | ICD-10-CM

## 2023-10-21 NOTE — Telephone Encounter (Signed)
 Prescription refill request for Xarelto  received.  Indication:aflutter Last office visit:9/24 Weight:82.1  kg Age:89 Scr:1.14  7/25 CrCl:52.01  ml/min  Prescription refilled

## 2023-10-22 ENCOUNTER — Ambulatory Visit

## 2023-10-24 ENCOUNTER — Telehealth: Payer: Self-pay

## 2023-10-24 LAB — CUP PACEART REMOTE DEVICE CHECK
Battery Remaining Longevity: 115 mo
Battery Voltage: 3.02 V
Brady Statistic RA Percent Paced: 0.83 %
Brady Statistic RV Percent Paced: 94.86 %
Date Time Interrogation Session: 20250802013928
Implantable Lead Connection Status: 753985
Implantable Lead Connection Status: 753985
Implantable Lead Implant Date: 20220610
Implantable Lead Implant Date: 20220610
Implantable Lead Location: 753859
Implantable Lead Location: 753860
Implantable Lead Model: 3830
Implantable Lead Model: 5076
Implantable Pulse Generator Implant Date: 20220610
Lead Channel Impedance Value: 247 Ohm
Lead Channel Impedance Value: 285 Ohm
Lead Channel Impedance Value: 304 Ohm
Lead Channel Impedance Value: 456 Ohm
Lead Channel Pacing Threshold Amplitude: 0.75 V
Lead Channel Pacing Threshold Amplitude: 0.875 V
Lead Channel Pacing Threshold Pulse Width: 0.4 ms
Lead Channel Pacing Threshold Pulse Width: 0.4 ms
Lead Channel Sensing Intrinsic Amplitude: 1.625 mV
Lead Channel Sensing Intrinsic Amplitude: 1.625 mV
Lead Channel Sensing Intrinsic Amplitude: 15.625 mV
Lead Channel Sensing Intrinsic Amplitude: 15.625 mV
Lead Channel Setting Pacing Amplitude: 1.5 V
Lead Channel Setting Pacing Amplitude: 2 V
Lead Channel Setting Pacing Pulse Width: 0.4 ms
Lead Channel Setting Sensing Sensitivity: 1.2 mV
Zone Setting Status: 755011

## 2023-10-24 NOTE — Telephone Encounter (Signed)
 Patient ongoing AF in progress since 7/27. Patient VP close to 94.9% of the time, low burden of tracking 100 -120.   Spoke to patient, he denies any symptoms just under a lot of stress with helping take care of his wife who has dementia and is declining.   No changes otherwise to diet, health or medications. Recently missed some doses of thyroid  medication - knows to take as prescribed.   He is due next month for annual f/u with Dr. Waddell, wills end to EP scheduling to set up. Will continue to monitor his transmissions.

## 2023-10-25 ENCOUNTER — Ambulatory Visit: Payer: Self-pay | Admitting: Internal Medicine

## 2023-10-27 NOTE — Telephone Encounter (Signed)
 Call x1; called patient to schedule patient from recall for 1 year follow up + to assess AF burden, wife answered and requested I c/b tomorrow as patient is at another appt right now.

## 2023-10-31 NOTE — Telephone Encounter (Signed)
 Call x2; attempted to call patient to schedule from recall for 1 yr f/u + to assess AF burden (from 8/4 phone note) - spoke w/ wife who states patient just left, advised I will c/b later in the week.

## 2023-11-04 NOTE — Telephone Encounter (Signed)
 Spoke w/ patient to schedule him for his yearly follow up w/ Dr. Waddell + to assess AF burden. Patient states he isn't sure what to do as his wife is about to pass away. Patient became emotional over the phone with me and asked that I call back at a later time.

## 2023-11-05 ENCOUNTER — Encounter (HOSPITAL_COMMUNITY): Payer: Self-pay

## 2023-11-05 ENCOUNTER — Inpatient Hospital Stay (HOSPITAL_COMMUNITY)
Admission: EM | Admit: 2023-11-05 | Discharge: 2023-11-21 | DRG: 205 | Disposition: E | Attending: Internal Medicine | Admitting: Internal Medicine

## 2023-11-05 ENCOUNTER — Telehealth: Payer: Self-pay | Admitting: Cardiothoracic Surgery

## 2023-11-05 ENCOUNTER — Emergency Department (HOSPITAL_COMMUNITY)

## 2023-11-05 ENCOUNTER — Other Ambulatory Visit: Payer: Self-pay

## 2023-11-05 DIAGNOSIS — Z1152 Encounter for screening for COVID-19: Secondary | ICD-10-CM

## 2023-11-05 DIAGNOSIS — Z79899 Other long term (current) drug therapy: Secondary | ICD-10-CM

## 2023-11-05 DIAGNOSIS — I2489 Other forms of acute ischemic heart disease: Secondary | ICD-10-CM | POA: Diagnosis present

## 2023-11-05 DIAGNOSIS — Z7952 Long term (current) use of systemic steroids: Secondary | ICD-10-CM

## 2023-11-05 DIAGNOSIS — N179 Acute kidney failure, unspecified: Secondary | ICD-10-CM | POA: Diagnosis present

## 2023-11-05 DIAGNOSIS — D849 Immunodeficiency, unspecified: Secondary | ICD-10-CM | POA: Diagnosis present

## 2023-11-05 DIAGNOSIS — I252 Old myocardial infarction: Secondary | ICD-10-CM

## 2023-11-05 DIAGNOSIS — J189 Pneumonia, unspecified organism: Principal | ICD-10-CM | POA: Diagnosis present

## 2023-11-05 DIAGNOSIS — Z515 Encounter for palliative care: Secondary | ICD-10-CM

## 2023-11-05 DIAGNOSIS — T45515A Adverse effect of anticoagulants, initial encounter: Secondary | ICD-10-CM | POA: Diagnosis present

## 2023-11-05 DIAGNOSIS — R29720 NIHSS score 20: Secondary | ICD-10-CM | POA: Diagnosis not present

## 2023-11-05 DIAGNOSIS — I82B12 Acute embolism and thrombosis of left subclavian vein: Secondary | ICD-10-CM | POA: Diagnosis present

## 2023-11-05 DIAGNOSIS — C439 Malignant melanoma of skin, unspecified: Secondary | ICD-10-CM | POA: Diagnosis present

## 2023-11-05 DIAGNOSIS — I442 Atrioventricular block, complete: Secondary | ICD-10-CM | POA: Diagnosis present

## 2023-11-05 DIAGNOSIS — R54 Age-related physical debility: Secondary | ICD-10-CM | POA: Diagnosis present

## 2023-11-05 DIAGNOSIS — J984 Other disorders of lung: Secondary | ICD-10-CM | POA: Diagnosis present

## 2023-11-05 DIAGNOSIS — J704 Drug-induced interstitial lung disorders, unspecified: Principal | ICD-10-CM | POA: Diagnosis present

## 2023-11-05 DIAGNOSIS — Z66 Do not resuscitate: Secondary | ICD-10-CM | POA: Diagnosis present

## 2023-11-05 DIAGNOSIS — H53461 Homonymous bilateral field defects, right side: Secondary | ICD-10-CM | POA: Diagnosis present

## 2023-11-05 DIAGNOSIS — R042 Hemoptysis: Secondary | ICD-10-CM | POA: Diagnosis present

## 2023-11-05 DIAGNOSIS — Z955 Presence of coronary angioplasty implant and graft: Secondary | ICD-10-CM

## 2023-11-05 DIAGNOSIS — N400 Enlarged prostate without lower urinary tract symptoms: Secondary | ICD-10-CM | POA: Diagnosis present

## 2023-11-05 DIAGNOSIS — R4701 Aphasia: Secondary | ICD-10-CM | POA: Diagnosis not present

## 2023-11-05 DIAGNOSIS — I6349 Cerebral infarction due to embolism of other cerebral artery: Secondary | ICD-10-CM | POA: Diagnosis not present

## 2023-11-05 DIAGNOSIS — I480 Paroxysmal atrial fibrillation: Secondary | ICD-10-CM | POA: Diagnosis present

## 2023-11-05 DIAGNOSIS — G40909 Epilepsy, unspecified, not intractable, without status epilepticus: Secondary | ICD-10-CM | POA: Diagnosis present

## 2023-11-05 DIAGNOSIS — I5032 Chronic diastolic (congestive) heart failure: Secondary | ICD-10-CM | POA: Diagnosis present

## 2023-11-05 DIAGNOSIS — E1122 Type 2 diabetes mellitus with diabetic chronic kidney disease: Secondary | ICD-10-CM | POA: Diagnosis present

## 2023-11-05 DIAGNOSIS — Z833 Family history of diabetes mellitus: Secondary | ICD-10-CM

## 2023-11-05 DIAGNOSIS — Z85820 Personal history of malignant melanoma of skin: Secondary | ICD-10-CM

## 2023-11-05 DIAGNOSIS — I110 Hypertensive heart disease with heart failure: Secondary | ICD-10-CM | POA: Diagnosis present

## 2023-11-05 DIAGNOSIS — D649 Anemia, unspecified: Secondary | ICD-10-CM | POA: Diagnosis present

## 2023-11-05 DIAGNOSIS — Z880 Allergy status to penicillin: Secondary | ICD-10-CM

## 2023-11-05 DIAGNOSIS — I10 Essential (primary) hypertension: Secondary | ICD-10-CM | POA: Diagnosis present

## 2023-11-05 DIAGNOSIS — I69351 Hemiplegia and hemiparesis following cerebral infarction affecting right dominant side: Secondary | ICD-10-CM

## 2023-11-05 DIAGNOSIS — J9601 Acute respiratory failure with hypoxia: Secondary | ICD-10-CM | POA: Diagnosis present

## 2023-11-05 DIAGNOSIS — Z5986 Financial insecurity: Secondary | ICD-10-CM

## 2023-11-05 DIAGNOSIS — Z8673 Personal history of transient ischemic attack (TIA), and cerebral infarction without residual deficits: Secondary | ICD-10-CM

## 2023-11-05 DIAGNOSIS — E1165 Type 2 diabetes mellitus with hyperglycemia: Secondary | ICD-10-CM | POA: Diagnosis present

## 2023-11-05 DIAGNOSIS — I2510 Atherosclerotic heart disease of native coronary artery without angina pectoris: Secondary | ICD-10-CM | POA: Diagnosis present

## 2023-11-05 DIAGNOSIS — R1312 Dysphagia, oropharyngeal phase: Secondary | ICD-10-CM | POA: Diagnosis present

## 2023-11-05 DIAGNOSIS — Z8249 Family history of ischemic heart disease and other diseases of the circulatory system: Secondary | ICD-10-CM

## 2023-11-05 DIAGNOSIS — E785 Hyperlipidemia, unspecified: Secondary | ICD-10-CM | POA: Diagnosis present

## 2023-11-05 DIAGNOSIS — F32A Depression, unspecified: Secondary | ICD-10-CM | POA: Diagnosis present

## 2023-11-05 DIAGNOSIS — Z950 Presence of cardiac pacemaker: Secondary | ICD-10-CM

## 2023-11-05 DIAGNOSIS — Z79890 Hormone replacement therapy (postmenopausal): Secondary | ICD-10-CM

## 2023-11-05 DIAGNOSIS — D6832 Hemorrhagic disorder due to extrinsic circulating anticoagulants: Secondary | ICD-10-CM | POA: Diagnosis present

## 2023-11-05 DIAGNOSIS — Z7901 Long term (current) use of anticoagulants: Secondary | ICD-10-CM

## 2023-11-05 DIAGNOSIS — Z87891 Personal history of nicotine dependence: Secondary | ICD-10-CM

## 2023-11-05 DIAGNOSIS — E039 Hypothyroidism, unspecified: Secondary | ICD-10-CM | POA: Diagnosis present

## 2023-11-05 LAB — COMPREHENSIVE METABOLIC PANEL WITH GFR
ALT: 30 U/L (ref 0–44)
AST: 46 U/L — ABNORMAL HIGH (ref 15–41)
Albumin: 3 g/dL — ABNORMAL LOW (ref 3.5–5.0)
Alkaline Phosphatase: 76 U/L (ref 38–126)
Anion gap: 11 (ref 5–15)
BUN: 20 mg/dL (ref 8–23)
CO2: 23 mmol/L (ref 22–32)
Calcium: 8.8 mg/dL — ABNORMAL LOW (ref 8.9–10.3)
Chloride: 104 mmol/L (ref 98–111)
Creatinine, Ser: 1.04 mg/dL (ref 0.61–1.24)
GFR, Estimated: >60 mL/min (ref >60)
Glucose, Bld: 195 mg/dL — ABNORMAL HIGH (ref 70–99)
Potassium: 4 mmol/L (ref 3.5–5.1)
Sodium: 138 mmol/L (ref 135–145)
Total Bilirubin: 0.7 mg/dL (ref 0.0–1.2)
Total Protein: 6.8 g/dL (ref 6.5–8.1)

## 2023-11-05 LAB — TROPONIN I (HIGH SENSITIVITY): Troponin I (High Sensitivity): 51 ng/L — ABNORMAL HIGH (ref <18)

## 2023-11-05 LAB — RESP PANEL BY RT-PCR (RSV, FLU A&B, COVID)  RVPGX2
Influenza A by PCR: NEGATIVE
Influenza B by PCR: NEGATIVE
Resp Syncytial Virus by PCR: NEGATIVE
SARS Coronavirus 2 by RT PCR: NEGATIVE

## 2023-11-05 MED ORDER — SODIUM CHLORIDE 0.9 % IV SOLN
500.0000 mg | Freq: Once | INTRAVENOUS | Status: AC
  Administered 2023-11-06: 500 mg via INTRAVENOUS
  Filled 2023-11-05: qty 5

## 2023-11-05 MED ORDER — SODIUM CHLORIDE 0.9 % IV SOLN
1.0000 g | Freq: Once | INTRAVENOUS | Status: AC
  Administered 2023-11-05: 1 g via INTRAVENOUS
  Filled 2023-11-05: qty 10

## 2023-11-05 NOTE — ED Notes (Signed)
 Pts family member informed me that he is in immunocompromise therapty. MD informed

## 2023-11-05 NOTE — ED Notes (Signed)
 Attempted to retireve labs again. Unable to.

## 2023-11-05 NOTE — Telephone Encounter (Signed)
 Received a call from the patient's daughter and 2 nieces (latter 2 are nurses). The patient's wife died today and he is quite upset.  They noticed he was coughing more, and tissues show blood tinging.  He does describe some SOB.  O2 sat when measured was 85-87% initially, then with 2L of O2 support (from his deceased wife's O2 supply), was able to get sat up to the low 90s.  Nieces concerned about infection vs. PE vs. Other etiology of SOB.  On xarelto  for Afib and reportedly adherent.   I discussed that if he's on xarelto , that does give him some protection against PE.  However, the low O2 sat is concerning, and I suggested getting him to an ED for further evaluation, including a CXR.  Nieces reported that he is quite resistant to going to the hospital, but they will try to convince him.  I discussed that keeping him on O2 support to keep sat>90% sounds reasonable.  Andee Flatten, MD Cards on call.

## 2023-11-05 NOTE — ED Notes (Signed)
 Unable to obtain all labs, [facility]il[facility] called.

## 2023-11-05 NOTE — ED Provider Notes (Signed)
 [place] EMERGENCY DEPARTMENT AT [facility] Provider Note   CSN: [id number] Arrival date & time: 11/05/23  2107     Patient presents with: Shortness of Breath   [name] is a 89 y.o. male.   HPI 89 year old male presents with shortness of breath.  History is from patient and family.  The patient's wife died this morning.  However he has had an acute on chronic cough for the last couple days with some mild blood, especially when he is coughing hard to bring something up.  He is on Xarelto .  The patient denies any leg swelling or abdominal distention.  Shortness of breath is mild at rest but more prominent with exertion.  No chest pain or fever or sore throat.  He does not wear oxygen but when the family checked his O2 sats tonight they were in the 70s and they put him on his wife's oxygen.  Prior to Admission medications   Medication Sig Start Date End Date Taking? Authorizing Provider  acetaminophen  (TYLENOL ) 325 MG tablet Take 1-2 tablets (325-650 mg total) by mouth every 4 (four) hours as needed for mild pain. 06/15/18   Love, Sharlet RAMAN, PA-C  amLODipine  (NORVASC ) 10 MG tablet Take 1 tablet (10 mg total) by mouth daily. 06/06/23   Duanne Butler ONEIDA, MD  atorvastatin  (LIPITOR ) 80 MG tablet TAKE 1 TABLET BY MOUTH EVERY DAY AT 6 PM 06/06/23   Duanne Butler ONEIDA, MD  carbamazepine  (CARBATROL ) 300 MG 12 hr capsule Take 1 capsule (300 mg total) by mouth 2 (two) times daily. 06/06/23   Duanne Butler ONEIDA, MD  cloNIDine  (CATAPRES ) 0.2 MG tablet Take 1 tablet (0.2 mg total) by mouth 2 (two) times daily. 06/06/23   Duanne Butler ONEIDA, MD  donepezil  (ARICEPT ) 5 MG tablet Take 1 tablet (5 mg total) by mouth at bedtime. 06/06/23   Duanne Butler ONEIDA, MD  escitalopram  (LEXAPRO ) 5 MG tablet Take 1 tablet (5 mg total) by mouth at bedtime. 06/06/23   Duanne Butler ONEIDA, MD  furosemide  (LASIX ) 20 MG tablet TAKE 1/2 TABLET(10 MG) BY MOUTH DAILY 06/06/23   Duanne Butler ONEIDA, MD  glucose blood test  strip 1 each by Other route as needed. Use as instructed 08/25/18   Bari Theodoro FALCON, MD  glucose monitoring kit (FREESTYLE) monitoring kit 1 each by Does not apply route as needed. 08/25/18   Bari Theodoro FALCON, MD  HYDROcodone -acetaminophen  (NORCO/VICODIN) 5-325 MG tablet Take 1 tablet by mouth every 6 (six) hours as needed. 05/30/22   Randol Simmonds, MD  isosorbide  mononitrate (IMDUR ) 60 MG 24 hr tablet Take 0.5 tablets (30 mg total) by mouth daily. 06/06/23   Duanne Butler ONEIDA, MD  Lancets (ACCU-CHEK SAFE-T PRO) lancets 1 each by Other route as needed. Use as instructed 08/25/18   Bari Theodoro FALCON, MD  levETIRAcetam  (KEPPRA ) 500 MG tablet Take 1 tablet (500 mg total) by mouth 2 (two) times daily. 06/06/23   Duanne Butler ONEIDA, MD  levothyroxine  (SYNTHROID ) 100 MCG tablet Take 1 tablet (100 mcg total) by mouth daily. 07/28/23   Duanne Butler ONEIDA, MD  losartan  (COZAAR ) 100 MG tablet Take 0.5 tablets (50 mg total) by mouth daily. 06/06/23   Duanne Butler ONEIDA, MD  Multiple Vitamins-Minerals (MULTIVITAMINS THER. W/MINERALS) TABS Take 1 tablet by mouth at bedtime.      [provider]  MYRBETRIQ  50 MG TB24 tablet Take 1 tablet (50 mg total) by mouth daily. 06/06/23   Duanne Butler ONEIDA, MD  predniSONE  (  DELTASONE ) 20 MG tablet 3 tabs poqday 1-2, 2 tabs poqday 3-4, 1 tab poqday 5-6 06/03/22   Duanne Butler DASEN, MD  tamsulosin  (FLOMAX ) 0.4 MG CAPS capsule Take 1 capsule (0.4 mg total) by mouth daily after supper. 06/06/23   Duanne Butler DASEN, MD  XARELTO  20 MG TABS tablet TAKE 1 TABLET(20 MG) BY MOUTH DAILY WITH SUPPER 10/21/23   Lavona Agent, MD    Allergies: Penicillins    Review of Systems  Constitutional:  Negative for fever.  HENT:  Negative for sore throat.   Respiratory:  Positive for cough and shortness of breath.   Cardiovascular:  Negative for chest pain and leg swelling.    Updated Vital Signs BP (!) 197/84   Pulse 83   Temp 98.3 F (36.8 C) (Oral)   Resp (!) 29   Ht 5' 8 (1.727 m)   Wt  80.7 kg   SpO2 97%   BMI 27.06 kg/m   Physical Exam Vitals and nursing note reviewed.  Constitutional:      General: He is not in acute distress.    Appearance: He is well-developed. He is not ill-appearing or diaphoretic.  HENT:     Head: Normocephalic and atraumatic.  Cardiovascular:     Rate and Rhythm: Normal rate and regular rhythm.     Heart sounds: Normal heart sounds.  Pulmonary:     Effort: Pulmonary effort is normal. No tachypnea or accessory muscle usage.     Breath sounds: No wheezing.  Abdominal:     Palpations: Abdomen is soft.     Tenderness: There is no abdominal tenderness.  Musculoskeletal:     Right lower leg: No edema.     Left lower leg: No edema.  Skin:    General: Skin is warm and dry.  Neurological:     Mental Status: He is alert.     (all labs ordered are listed, but only abnormal results are displayed) Labs Reviewed  COMPREHENSIVE METABOLIC PANEL WITH GFR - Abnormal; Notable for the following components:      Result Value   Glucose, Bld 195 (*)    Calcium  8.8 (*)    Albumin 3.0 (*)    AST 46 (*)    All other components within normal limits  TROPONIN I (HIGH SENSITIVITY) - Abnormal; Notable for the following components:   Troponin I (High Sensitivity) 51 (*)    All other components within normal limits  RESP PANEL BY RT-PCR (RSV, FLU A&B, COVID)  [id number]  CULTURE, BLOOD (ROUTINE X 2)  CULTURE, BLOOD (ROUTINE X 2)  CBC WITH DIFFERENTIAL/PLATELET  BRAIN NATRIURETIC PEPTIDE  CBC WITH DIFFERENTIAL/PLATELET  I-STAT CG4 LACTIC ACID, ED  TROPONIN I (HIGH SENSITIVITY)    EKG: EKG Interpretation Date/Time:  Saturday November 05 2023 21:24:22 EDT Ventricular Rate:  91 PR Interval:    QRS Duration:  138 QT Interval:  375 QTC Calculation: 462 R Axis:   -38  Text Interpretation: Atrial flutter Nonspecific IVCD with LAD Abnormal T, consider ischemia, lateral leads Confirmed by Freddi Hamilton [phone]) on 11/05/2023 9:39:41 PM  Radiology: [facility]  Chest 2 View Result Date: 11/05/2023 CLINICAL DATA:  Cough EXAM: CHEST - 2 VIEW COMPARISON:  01/05/2022 FINDINGS: Left-sided pacing device as before. Low lung volumes. Stable cardiomediastinal silhouette. Chronic elevation right diaphragm. Patchy bilateral airspace opacities, favor bilateral pneumonia. No pleural effusion or pneumothorax. IMPRESSION: Patchy bilateral airspace opacities, favor bilateral pneumonia. Radiographic follow-up to resolution is recommended. Electronically Signed   By: Luke Tequilla Cousineau HERO.D.  On: 11/05/2023 23:00     Procedures   Medications Ordered in the ED  cefTRIAXone  (ROCEPHIN ) 1 g in sodium chloride  0.9 % 100 mL IVPB (has no administration in time range)  azithromycin  (ZITHROMAX ) 500 mg in sodium chloride  0.9 % 250 mL IVPB (has no administration in time range)                                    Medical Decision Making Amount and/or Complexity of Data Reviewed Labs: ordered.    Details: Creatinine stable.  Mildly elevated troponin, likely secondary to his pneumonia and hypoxia Radiology: ordered.  Risk Decision regarding hospitalization.   Patient is found to have new respiratory failure with hypoxia.  No distress though is a little tachypneic.  Chest x-ray is concerning for pneumonia which goes along with his cough.  His mild hemoptysis is probably from the pneumonia itself along with being on Xarelto .  No large-volume hemoptysis per patient and family.  CBC is currently pending, will need admission after this.  Started on Rocephin  (has tolerated cephalosporins in the past) and azithromycin . Care transferred to Dr. Nettie.     Final diagnoses:  Community acquired pneumonia, unspecified laterality    ED Discharge Orders     None          Freddi Hamilton, MD 11/05/23 [phone]

## 2023-11-05 NOTE — ED Triage Notes (Signed)
 Pt BIB [facility] from home, c/o SOB x2 days with exertion, has a pace, hx of AFIB, company called him saying that his afib was more active, pt is on eliquis ,    190/90 HR 89 96% 4L [place] 0/0 pain

## 2023-11-06 ENCOUNTER — Inpatient Hospital Stay (HOSPITAL_COMMUNITY)

## 2023-11-06 DIAGNOSIS — R29715 NIHSS score 15: Secondary | ICD-10-CM | POA: Diagnosis not present

## 2023-11-06 DIAGNOSIS — M7989 Other specified soft tissue disorders: Secondary | ICD-10-CM | POA: Diagnosis not present

## 2023-11-06 DIAGNOSIS — D849 Immunodeficiency, unspecified: Secondary | ICD-10-CM | POA: Diagnosis present

## 2023-11-06 DIAGNOSIS — J189 Pneumonia, unspecified organism: Secondary | ICD-10-CM | POA: Diagnosis present

## 2023-11-06 DIAGNOSIS — I442 Atrioventricular block, complete: Secondary | ICD-10-CM | POA: Diagnosis present

## 2023-11-06 DIAGNOSIS — J984 Other disorders of lung: Secondary | ICD-10-CM | POA: Diagnosis not present

## 2023-11-06 DIAGNOSIS — I69351 Hemiplegia and hemiparesis following cerebral infarction affecting right dominant side: Secondary | ICD-10-CM | POA: Diagnosis not present

## 2023-11-06 DIAGNOSIS — Z66 Do not resuscitate: Secondary | ICD-10-CM | POA: Diagnosis present

## 2023-11-06 DIAGNOSIS — I2489 Other forms of acute ischemic heart disease: Secondary | ICD-10-CM | POA: Diagnosis present

## 2023-11-06 DIAGNOSIS — N179 Acute kidney failure, unspecified: Secondary | ICD-10-CM | POA: Diagnosis present

## 2023-11-06 DIAGNOSIS — J704 Drug-induced interstitial lung disorders, unspecified: Secondary | ICD-10-CM | POA: Diagnosis present

## 2023-11-06 DIAGNOSIS — G40909 Epilepsy, unspecified, not intractable, without status epilepticus: Secondary | ICD-10-CM | POA: Diagnosis present

## 2023-11-06 DIAGNOSIS — I6349 Cerebral infarction due to embolism of other cerebral artery: Secondary | ICD-10-CM | POA: Diagnosis not present

## 2023-11-06 DIAGNOSIS — I509 Heart failure, unspecified: Secondary | ICD-10-CM | POA: Diagnosis not present

## 2023-11-06 DIAGNOSIS — I63423 Cerebral infarction due to embolism of bilateral anterior cerebral arteries: Secondary | ICD-10-CM | POA: Diagnosis not present

## 2023-11-06 DIAGNOSIS — I110 Hypertensive heart disease with heart failure: Secondary | ICD-10-CM | POA: Diagnosis present

## 2023-11-06 DIAGNOSIS — J9601 Acute respiratory failure with hypoxia: Secondary | ICD-10-CM

## 2023-11-06 DIAGNOSIS — I5032 Chronic diastolic (congestive) heart failure: Secondary | ICD-10-CM | POA: Diagnosis present

## 2023-11-06 DIAGNOSIS — I82B12 Acute embolism and thrombosis of left subclavian vein: Secondary | ICD-10-CM | POA: Diagnosis present

## 2023-11-06 DIAGNOSIS — Z515 Encounter for palliative care: Secondary | ICD-10-CM | POA: Diagnosis not present

## 2023-11-06 DIAGNOSIS — D6832 Hemorrhagic disorder due to extrinsic circulating anticoagulants: Secondary | ICD-10-CM | POA: Diagnosis present

## 2023-11-06 DIAGNOSIS — R042 Hemoptysis: Secondary | ICD-10-CM | POA: Diagnosis present

## 2023-11-06 DIAGNOSIS — R7989 Other specified abnormal findings of blood chemistry: Secondary | ICD-10-CM | POA: Diagnosis not present

## 2023-11-06 DIAGNOSIS — E1151 Type 2 diabetes mellitus with diabetic peripheral angiopathy without gangrene: Secondary | ICD-10-CM | POA: Diagnosis not present

## 2023-11-06 DIAGNOSIS — D649 Anemia, unspecified: Secondary | ICD-10-CM | POA: Diagnosis present

## 2023-11-06 DIAGNOSIS — Z8673 Personal history of transient ischemic attack (TIA), and cerebral infarction without residual deficits: Secondary | ICD-10-CM | POA: Diagnosis not present

## 2023-11-06 DIAGNOSIS — E1122 Type 2 diabetes mellitus with diabetic chronic kidney disease: Secondary | ICD-10-CM | POA: Diagnosis present

## 2023-11-06 DIAGNOSIS — E039 Hypothyroidism, unspecified: Secondary | ICD-10-CM | POA: Diagnosis present

## 2023-11-06 DIAGNOSIS — Z1152 Encounter for screening for COVID-19: Secondary | ICD-10-CM | POA: Diagnosis not present

## 2023-11-06 DIAGNOSIS — F32A Depression, unspecified: Secondary | ICD-10-CM | POA: Diagnosis present

## 2023-11-06 DIAGNOSIS — I480 Paroxysmal atrial fibrillation: Secondary | ICD-10-CM | POA: Diagnosis present

## 2023-11-06 DIAGNOSIS — T45516A Underdosing of anticoagulants, initial encounter: Secondary | ICD-10-CM | POA: Diagnosis not present

## 2023-11-06 DIAGNOSIS — R29721 NIHSS score 21: Secondary | ICD-10-CM | POA: Diagnosis not present

## 2023-11-06 DIAGNOSIS — C439 Malignant melanoma of skin, unspecified: Secondary | ICD-10-CM | POA: Diagnosis present

## 2023-11-06 DIAGNOSIS — R4701 Aphasia: Secondary | ICD-10-CM | POA: Diagnosis not present

## 2023-11-06 LAB — CBC WITH DIFFERENTIAL/PLATELET
Abs Immature Granulocytes: 0.07 K/uL (ref 0.00–0.07)
Basophils Absolute: 0.1 K/uL (ref 0.0–0.1)
Basophils Relative: 1 %
Eosinophils Absolute: 0.5 K/uL (ref 0.0–0.5)
Eosinophils Relative: 5 %
HCT: 31.9 % — ABNORMAL LOW (ref 39.0–52.0)
Hemoglobin: 10.1 g/dL — ABNORMAL LOW (ref 13.0–17.0)
Immature Granulocytes: 1 %
Lymphocytes Relative: 12 %
Lymphs Abs: 1.2 K/uL (ref 0.7–4.0)
MCH: 30.1 pg (ref 26.0–34.0)
MCHC: 31.7 g/dL (ref 30.0–36.0)
MCV: 95.2 fL (ref 80.0–100.0)
Monocytes Absolute: 1.3 K/uL — ABNORMAL HIGH (ref 0.1–1.0)
Monocytes Relative: 13 %
Neutro Abs: 6.9 K/uL (ref 1.7–7.7)
Neutrophils Relative %: 68 %
Platelets: 306 K/uL (ref 150–400)
RBC: 3.35 MIL/uL — ABNORMAL LOW (ref 4.22–5.81)
RDW: 15.5 % (ref 11.5–15.5)
WBC: 10.1 K/uL (ref 4.0–10.5)
nRBC: 0 % (ref 0.0–0.2)

## 2023-11-06 LAB — BLOOD GAS, ARTERIAL
Acid-base deficit: 2.7 mmol/L — ABNORMAL HIGH (ref 0.0–2.0)
Bicarbonate: 22.6 mmol/L (ref 20.0–28.0)
Drawn by: 38235
O2 Saturation: 99.5 %
Patient temperature: 37.4
pCO2 arterial: 41 mmHg (ref 32–48)
pH, Arterial: 7.35 (ref 7.35–7.45)
pO2, Arterial: 104 mmHg (ref 83–108)

## 2023-11-06 LAB — RESPIRATORY PANEL BY PCR

## 2023-11-06 LAB — ABO/RH: ABO/RH(D): O NEG

## 2023-11-06 LAB — CBC
HCT: 33.1 % — ABNORMAL LOW (ref 39.0–52.0)
Hemoglobin: 10.7 g/dL — ABNORMAL LOW (ref 13.0–17.0)
MCH: 30.6 pg (ref 26.0–34.0)
MCHC: 32.3 g/dL (ref 30.0–36.0)
MCV: 94.6 fL (ref 80.0–100.0)
Platelets: 298 K/uL (ref 150–400)
RBC: 3.5 MIL/uL — ABNORMAL LOW (ref 4.22–5.81)
RDW: 15.9 % — ABNORMAL HIGH (ref 11.5–15.5)
WBC: 14.4 K/uL — ABNORMAL HIGH (ref 4.0–10.5)
nRBC: 0 % (ref 0.0–0.2)

## 2023-11-06 LAB — MRSA NEXT GEN BY PCR, NASAL: MRSA by PCR Next Gen: NOT DETECTED

## 2023-11-06 LAB — COMPREHENSIVE METABOLIC PANEL WITH GFR
ALT: 29 U/L (ref 0–44)
AST: 41 U/L (ref 15–41)
Albumin: 2.9 g/dL — ABNORMAL LOW (ref 3.5–5.0)
Alkaline Phosphatase: 82 U/L (ref 38–126)
Anion gap: 13 (ref 5–15)
BUN: 15 mg/dL (ref 8–23)
CO2: 21 mmol/L — ABNORMAL LOW (ref 22–32)
Calcium: 8.7 mg/dL — ABNORMAL LOW (ref 8.9–10.3)
Chloride: 105 mmol/L (ref 98–111)
Creatinine, Ser: 0.99 mg/dL (ref 0.61–1.24)
GFR, Estimated: >60 mL/min (ref >60)
Glucose, Bld: 169 mg/dL — ABNORMAL HIGH (ref 70–99)
Potassium: 3.9 mmol/L (ref 3.5–5.1)
Sodium: 139 mmol/L (ref 135–145)
Total Bilirubin: 1 mg/dL (ref 0.0–1.2)
Total Protein: 6.6 g/dL (ref 6.5–8.1)

## 2023-11-06 LAB — HEMOGLOBIN AND HEMATOCRIT, BLOOD
HCT: 28.6 % — ABNORMAL LOW (ref 39.0–52.0)
Hemoglobin: 9.2 g/dL — ABNORMAL LOW (ref 13.0–17.0)

## 2023-11-06 LAB — EXPECTORATED SPUTUM ASSESSMENT W GRAM STAIN, RFLX TO RESP C

## 2023-11-06 LAB — I-STAT CG4 LACTIC ACID, ED: Lactic Acid, Venous: 1.3 mmol/L (ref 0.5–1.9)

## 2023-11-06 LAB — TYPE AND SCREEN
ABO/RH(D): O NEG
Antibody Screen: NEGATIVE

## 2023-11-06 LAB — PROCALCITONIN: Procalcitonin: <0.1 ng/mL

## 2023-11-06 LAB — STREP PNEUMONIAE URINARY ANTIGEN: Strep Pneumo Urinary Antigen: NEGATIVE

## 2023-11-06 LAB — HIV ANTIBODY (ROUTINE TESTING W REFLEX): HIV Screen 4th Generation wRfx: NONREACTIVE

## 2023-11-06 LAB — LACTIC ACID, PLASMA: Lactic Acid, Venous: 1.1 mmol/L (ref 0.5–1.9)

## 2023-11-06 LAB — BRAIN NATRIURETIC PEPTIDE: B Natriuretic Peptide: 450.6 pg/mL — ABNORMAL HIGH (ref 0.0–100.0)

## 2023-11-06 LAB — TROPONIN I (HIGH SENSITIVITY): Troponin I (High Sensitivity): 59 ng/L — ABNORMAL HIGH (ref <18)

## 2023-11-06 MED ORDER — AMLODIPINE BESYLATE 10 MG PO TABS
10.0000 mg | ORAL_TABLET | Freq: Every day | ORAL | Status: DC
  Administered 2023-11-06 – 2023-11-08 (×3): 10 mg via ORAL
  Filled 2023-11-06 (×3): qty 1

## 2023-11-06 MED ORDER — SODIUM CHLORIDE 0.9 % IV SOLN
2.0000 g | INTRAVENOUS | Status: DC
  Administered 2023-11-06 – 2023-11-08 (×3): 2 g via INTRAVENOUS
  Filled 2023-11-06 (×3): qty 20

## 2023-11-06 MED ORDER — HYDRALAZINE HCL 20 MG/ML IJ SOLN
10.0000 mg | Freq: Four times a day (QID) | INTRAMUSCULAR | Status: DC | PRN
  Administered 2023-11-09 (×2): 10 mg via INTRAVENOUS
  Filled 2023-11-06 (×2): qty 1

## 2023-11-06 MED ORDER — ACETAMINOPHEN 325 MG PO TABS: 325.0000 mg | ORAL_TABLET | ORAL | Status: DC | PRN

## 2023-11-06 MED ORDER — ISOSORBIDE MONONITRATE ER 30 MG PO TB24
30.0000 mg | ORAL_TABLET | Freq: Every day | ORAL | Status: DC
  Administered 2023-11-06 – 2023-11-08 (×3): 30 mg via ORAL
  Filled 2023-11-06 (×3): qty 1

## 2023-11-06 MED ORDER — TORSEMIDE 20 MG PO TABS
20.0000 mg | ORAL_TABLET | Freq: Every day | ORAL | Status: DC
  Administered 2023-11-06 – 2023-11-08 (×3): 20 mg via ORAL
  Filled 2023-11-06 (×3): qty 1

## 2023-11-06 MED ORDER — IPRATROPIUM-ALBUTEROL 0.5-2.5 (3) MG/3ML IN SOLN
3.0000 mL | RESPIRATORY_TRACT | Status: DC | PRN
  Administered 2023-11-09: 3 mL via RESPIRATORY_TRACT
  Filled 2023-11-06 (×2): qty 3

## 2023-11-06 MED ORDER — SODIUM CHLORIDE 0.9 % IV SOLN: INTRAVENOUS | Status: DC

## 2023-11-06 MED ORDER — IOHEXOL 350 MG/ML SOLN
75.0000 mL | Freq: Once | INTRAVENOUS | Status: AC | PRN
  Administered 2023-11-06: 75 mL via INTRAVENOUS

## 2023-11-06 MED ORDER — ATORVASTATIN CALCIUM 80 MG PO TABS
80.0000 mg | ORAL_TABLET | Freq: Every day | ORAL | Status: DC
Start: 2023-11-06 — End: 2023-11-08
  Administered 2023-11-06 – 2023-11-08 (×3): 80 mg via ORAL
  Filled 2023-11-06 (×3): qty 1

## 2023-11-06 MED ORDER — RIVAROXABAN 20 MG PO TABS: 20.0000 mg | ORAL_TABLET | Freq: Every day | ORAL | Status: DC

## 2023-11-06 MED ORDER — FUROSEMIDE 10 MG/ML IJ SOLN
40.0000 mg | Freq: Once | INTRAMUSCULAR | Status: AC
  Administered 2023-11-06: 40 mg via INTRAVENOUS
  Filled 2023-11-06: qty 4

## 2023-11-06 MED ORDER — SODIUM CHLORIDE 0.9 % IV SOLN
500.0000 mg | INTRAVENOUS | Status: DC
  Administered 2023-11-06 – 2023-11-08 (×3): 500 mg via INTRAVENOUS
  Filled 2023-11-06 (×3): qty 5

## 2023-11-06 MED ORDER — IPRATROPIUM-ALBUTEROL 0.5-2.5 (3) MG/3ML IN SOLN
RESPIRATORY_TRACT | Status: AC
  Administered 2023-11-06: 3 mL
  Filled 2023-11-06: qty 3

## 2023-11-06 MED ORDER — CARBAMAZEPINE ER 100 MG PO TB12
300.0000 mg | ORAL_TABLET | Freq: Two times a day (BID) | ORAL | Status: DC
  Administered 2023-11-06 – 2023-11-08 (×5): 300 mg via ORAL
  Filled 2023-11-06 (×8): qty 3

## 2023-11-06 MED ORDER — ADULT MULTIVITAMIN W/MINERALS CH
1.0000 | ORAL_TABLET | Freq: Every day | ORAL | Status: DC
  Administered 2023-11-06 – 2023-11-07 (×2): 1 via ORAL
  Filled 2023-11-06 (×2): qty 1

## 2023-11-06 MED ORDER — MELATONIN 5 MG PO TABS
5.0000 mg | ORAL_TABLET | Freq: Once | ORAL | Status: AC
  Administered 2023-11-06: 5 mg via ORAL
  Filled 2023-11-06: qty 1

## 2023-11-06 MED ORDER — LEVOTHYROXINE SODIUM 100 MCG PO TABS
100.0000 ug | ORAL_TABLET | Freq: Every day | ORAL | Status: DC
  Administered 2023-11-06 – 2023-11-08 (×3): 100 ug via ORAL
  Filled 2023-11-06 (×4): qty 1

## 2023-11-06 MED ORDER — MIRABEGRON ER 25 MG PO TB24
50.0000 mg | ORAL_TABLET | Freq: Every day | ORAL | Status: DC
  Administered 2023-11-06 – 2023-11-08 (×3): 50 mg via ORAL
  Filled 2023-11-06 (×3): qty 2
  Filled 2023-11-06: qty 1

## 2023-11-06 MED ORDER — MORPHINE SULFATE (PF) 2 MG/ML IV SOLN
2.0000 mg | Freq: Once | INTRAVENOUS | Status: AC
  Administered 2023-11-06: 2 mg via INTRAVENOUS
  Filled 2023-11-06: qty 1

## 2023-11-06 MED ORDER — LEVETIRACETAM 500 MG PO TABS
500.0000 mg | ORAL_TABLET | Freq: Two times a day (BID) | ORAL | Status: DC
  Administered 2023-11-06 – 2023-11-08 (×5): 500 mg via ORAL
  Filled 2023-11-06 (×5): qty 1

## 2023-11-06 MED ORDER — CLONIDINE HCL 0.1 MG PO TABS
0.2000 mg | ORAL_TABLET | Freq: Two times a day (BID) | ORAL | Status: DC
  Administered 2023-11-06 – 2023-11-08 (×5): 0.2 mg via ORAL
  Filled 2023-11-06: qty 2
  Filled 2023-11-06: qty 1
  Filled 2023-11-06 (×4): qty 2

## 2023-11-06 MED ORDER — TAMSULOSIN HCL 0.4 MG PO CAPS
0.4000 mg | ORAL_CAPSULE | Freq: Every day | ORAL | Status: DC
  Administered 2023-11-06 – 2023-11-07 (×3): 0.4 mg via ORAL
  Filled 2023-11-06 (×5): qty 1

## 2023-11-06 MED ORDER — ESCITALOPRAM OXALATE 10 MG PO TABS
5.0000 mg | ORAL_TABLET | Freq: Every day | ORAL | Status: DC
  Administered 2023-11-06 – 2023-11-07 (×3): 5 mg via ORAL
  Filled 2023-11-06 (×3): qty 1

## 2023-11-06 MED ORDER — METHYLPREDNISOLONE SODIUM SUCC 40 MG IJ SOLR
40.0000 mg | Freq: Two times a day (BID) | INTRAMUSCULAR | Status: DC
  Administered 2023-11-06 – 2023-11-08 (×5): 40 mg via INTRAVENOUS
  Filled 2023-11-06 (×5): qty 1

## 2023-11-06 MED ORDER — TRANEXAMIC ACID FOR INHALATION: 500.0000 mg | RESPIRATORY_TRACT | Status: DC | PRN

## 2023-11-06 MED ORDER — FUROSEMIDE 20 MG PO TABS: 10.0000 mg | ORAL_TABLET | Freq: Every day | ORAL | Status: DC

## 2023-11-06 MED ORDER — DONEPEZIL HCL 5 MG PO TABS
5.0000 mg | ORAL_TABLET | Freq: Every day | ORAL | Status: DC
  Administered 2023-11-06 – 2023-11-07 (×3): 5 mg via ORAL
  Filled 2023-11-06 (×3): qty 1

## 2023-11-06 MED ORDER — LOSARTAN POTASSIUM 50 MG PO TABS
50.0000 mg | ORAL_TABLET | Freq: Every day | ORAL | Status: DC
  Administered 2023-11-06 – 2023-11-08 (×3): 50 mg via ORAL
  Filled 2023-11-06 (×3): qty 1

## 2023-11-06 NOTE — Progress Notes (Signed)
 PROGRESS NOTE    [name]  FMW:[id number] DOB: 1934-03-15 DOA: 11/05/2023 PCP: Duanne Butler ONEIDA, MD  Outpatient Specialists:     Brief Narrative:  Patient is an 89 year old male with past medical history significant for atrial flutter, BPH, coronary artery disease, diabetes mellitus, dyslipidemia, myocardial infarction, occlusion and stenosis of the carotid artery, seizure disorder, stroke, stage III melanoma of the back status post neoadjuvant immunotherapy and resection, intermittent complete heart block status post pacemaker placement, paroxysmal atrial fibrillation on Xarelto , gait imbalance, documented dementia and diastolic CHF.  Patient was admitted with shortness of breath, with O2 sat in the 80s and production of phlegm that is blood-tinged.  No fever or chills.  Procalcitonin is less than 0.1.  CTA chest revealed widespread fluffy infiltrate bilaterally.  Leukocytosis noted, but could be reactive (WBC of 10.1 on presentation, but rose to 14.4 today).  Pulmonary input is appreciated.  11/06/2023: Patient seen alongside patient's son.  Patient continues to need supplemental oxygen.  No fever or chills.  Patient is currently on antibiotics.  Pulmonary input is appreciated.  Also communicated with the pulmonary team.  Also communicated with patient's granddaughter over the phone.  Xarelto  is currently on hold.   Assessment & Plan:   Principal Problem:   CAP (community acquired pneumonia)   Acute hypoxic respiratory failure: Possible pneumonia query atypical: Possible pulmonary hemorrhage:  - Patient was on Xarelto  for atrial fibrillation. - Patient was coughing up blood tinged sputum. - Xarelto  is on hold. - Procalcitonin is less than 0.1. - WBC on presentation was 10.1, with no left shift.  WBC of 14.4 this morning, but could be reactive. - Pulmonary input is highly appreciated. - Patient is on IV Rocephin  and azithromycin . -Negative respiratory panel by PCR. -Negative  strep pneumo urinary antigen. -Follow sputum culture -Continue supplemental oxygen. -Urine Legionella antigen is pending. - Repeat CBC in the morning with differentials.   Hemoptysis - Patient was coughing up blood-tinged sputum. - See above documentation.    - concern for possible pulmonary hemorrhage - CTA chest was negative for pulmonary embolism. - Pulmonary team input is highly appreciated.     Anemia - Likely multifactorial. - Hemoglobin of 10.1 g/dL on presentation, with MCV of 95.2. - Repeat hemoglobin level of 9.2 g/dL this morning. - Continue to monitor H/H. - Low threshold to add iron panel, B12 and folate levels.      Stage III Melanoma -on immunotherapy    Abnormal cardiac enzymes:  Hx of CAD - presumed due to demand  -no chest pain  -resume imdur  as tolerated and atrovastatin - Troponin of 51-59.   Hx of P Afib  - Xarelto  is on hold.   CHF with preserved ejection fraction:  -patient on low dose lasix   - bnp elevated from baseline  - Start torsemide  20 mg p.o. once daily. - Monitor renal function and electrolytes closely. - Monitor blood pressure.     Hypertension, Uncontrolled -resume home regimen with amlodipine ,clonidine ,losartan  - Start torsemide . - Continue to optimize.    CVA   DMII -iss/fs    Seizure d/o  -continue keppra , carbamazepine    Dementia  -resume aricept    Gait disturbance -PT   Depression Lexapro    Social -wife passed yesterday - social work to follow   DVT prophylaxis: Xarelto  is on hold. Code Status: Full code for now. Family Communication: Son and granddaughter. Disposition Plan: Inpatient.   Consultants:  Pulmonary.  Procedures:  None for now.  Antimicrobials:  IV Rocephin . IV  azithromycin    Subjective: She remains short of breath.  Objective: Vitals:   11/06/23 1210 11/06/23 1214 11/06/23 1304 11/06/23 1719  BP:   (!) 144/65 (!) 150/66  Pulse: 78 66 71 64  Resp: (!) 24 (!) 22 (!) 21 (!) 22   Temp:   97.6 F (36.4 C) 97.9 F (36.6 C)  TempSrc:   Oral Oral  SpO2: 95% 95% 95% 96%  Weight:      Height:        Intake/Output Summary (Last 24 hours) at 11/06/2023 1757 Last data filed at 11/06/2023 0500 Gross per 24 hour  Intake 120 ml  Output --  Net 120 ml   Filed Weights   11/05/23 2118  Weight: 80.7 kg    Examination:  General exam: Appears calm and comfortable  Respiratory system: Decreased air entry. Cardiovascular system: S1 & S2, irregular  Gastrointestinal system: Abdomen is soft and nontender.  Central nervous system: Awake and alert.    Data Reviewed: I have personally reviewed following labs and imaging studies  CBC: Recent Labs  Lab 11/05/23 2357 11/06/23 0603 11/06/23 1149  WBC 10.1 14.4*  --   NEUTROABS 6.9  --   --   HGB 10.1* 10.7* 9.2*  HCT 31.9* 33.1* 28.6*  MCV 95.2 94.6  --   PLT 306 298  --    Basic Metabolic Panel: Recent Labs  Lab 11/05/23 2154 11/06/23 0603  NA 138 139  K 4.0 3.9  CL 104 105  CO2 23 21*  GLUCOSE 195* 169*  BUN 20 15  CREATININE 1.04 0.99  CALCIUM  8.8* 8.7*   GFR: Estimated Creatinine Clearance: 49.9 mL/min (by C-G formula based on SCr of 0.99 mg/dL). Liver Function Tests: Recent Labs  Lab 11/05/23 2154 11/06/23 0603  AST 46* 41  ALT 30 29  ALKPHOS 76 82  BILITOT 0.7 1.0  PROT 6.8 6.6  ALBUMIN 3.0* 2.9*   No results for input(s): LIPASE, AMYLASE in the last 168 hours. No results for input(s): AMMONIA in the last 168 hours. Coagulation Profile: No results for input(s): INR, PROTIME in the last 168 hours. Cardiac Enzymes: No results for input(s): CKTOTAL, CKMB, CKMBINDEX, TROPONINI in the last 168 hours. BNP (last 3 results) No results for input(s): PROBNP in the last 8760 hours. HbA1C: No results for input(s): HGBA1C in the last 72 hours. CBG: No results for input(s): GLUCAP in the last 168 hours. Lipid Profile: No results for input(s): CHOL, HDL, LDLCALC,  TRIG, CHOLHDL, LDLDIRECT in the last 72 hours. Thyroid  Function Tests: No results for input(s): TSH, T4TOTAL, FREET4, T3FREE, THYROIDAB in the last 72 hours. Anemia Panel: No results for input(s): VITAMINB12, FOLATE, FERRITIN, TIBC, IRON, RETICCTPCT in the last 72 hours. Urine analysis:    Component Value Date/Time   COLORURINE YELLOW 01/05/2022 1801   APPEARANCEUR CLEAR 01/05/2022 1801   LABSPEC 1.011 01/05/2022 1801   PHURINE 6.0 01/05/2022 1801   GLUCOSEU NEGATIVE 01/05/2022 1801   HGBUR NEGATIVE 01/05/2022 1801   BILIRUBINUR NEGATIVE 01/05/2022 1801   KETONESUR NEGATIVE 01/05/2022 1801   PROTEINUR NEGATIVE 01/05/2022 1801   UROBILINOGEN 0.2 10/22/2013 1451   NITRITE NEGATIVE 01/05/2022 1801   LEUKOCYTESUR NEGATIVE 01/05/2022 1801   Sepsis Labs: @LABRCNTIP (procalcitonin:4,lacticidven:4)  ) Recent Results (from the past 240 hours)  Resp panel by RT-PCR (RSV, Flu A&B, Covid) Anterior Nasal Swab     Status: None   Collection Time: 11/05/23  9:54 PM   Specimen: Anterior Nasal Swab  Result Value Ref Range Status  SARS Coronavirus 2 by RT PCR NEGATIVE NEGATIVE Final   Influenza A by PCR NEGATIVE NEGATIVE Final   Influenza B by PCR NEGATIVE NEGATIVE Final    Comment: (NOTE) The Xpert Xpress SARS-CoV-2/FLU/RSV plus assay is intended as an aid in the diagnosis of influenza from Nasopharyngeal swab specimens and should not be used as a sole basis for treatment. Nasal washings and aspirates are unacceptable for Xpert Xpress SARS-CoV-2/FLU/RSV testing.  Fact Sheet for Patients: BloggerCourse.com  Fact Sheet for Healthcare Providers: SeriousBroker.it  This test is not yet approved or cleared by the [place]  FDA and has been authorized for detection and/or diagnosis of SARS-CoV-2 by FDA under an Emergency Use Authorization (EUA). This EUA will remain in effect (meaning this test can be used) for  the duration of the COVID-19 declaration under Section 564(b)(1) of the Act, 21 U.S.C. section 360bbb-3(b)(1), unless the authorization is terminated or revoked.     Resp Syncytial Virus by PCR NEGATIVE NEGATIVE Final    Comment: (NOTE) Fact Sheet for Patients: BloggerCourse.com  Fact Sheet for Healthcare Providers: SeriousBroker.it  This test is not yet approved or cleared by the [place]  FDA and has been authorized for detection and/or diagnosis of SARS-CoV-2 by FDA under an Emergency Use Authorization (EUA). This EUA will remain in effect (meaning this test can be used) for the duration of the COVID-19 declaration under Section 564(b)(1) of the Act, 21 U.S.C. section 360bbb-3(b)(1), unless the authorization is terminated or revoked.  Performed at [facility], [address] [place], [place] [address]   Culture, blood (routine x 2)     Status: None (Preliminary result)   Collection Time: 11/05/23 11:57 PM   Specimen: BLOOD LEFT ARM  Result Value Ref Range Status   Specimen Description BLOOD LEFT ARM  Final   Special Requests   Final    BOTTLES DRAWN AEROBIC AND ANAEROBIC Blood Culture adequate volume Performed at [facility], [address] [place], [place] [address]    Culture PENDING  Incomplete   Report Status PENDING  Incomplete  Culture, blood (routine x 2)     Status: None (Preliminary result)   Collection Time: 11/05/23 11:57 PM   Specimen: BLOOD LEFT ARM  Result Value Ref Range Status   Specimen Description BLOOD LEFT ARM  Final   Special Requests   Final    BOTTLES DRAWN AEROBIC AND ANAEROBIC Blood Culture adequate volume Performed at [facility], [address], [place], [place] [address]    Culture PENDING  Incomplete   Report Status PENDING  Incomplete  Respiratory (~20 pathogens) panel by PCR     Status: None   Collection Time: 11/06/23  7:02 AM   Specimen: Nasopharyngeal Swab;  Respiratory  Result Value Ref Range Status   Adenovirus NOT DETECTED NOT DETECTED Final   Coronavirus 229E NOT DETECTED NOT DETECTED Final    Comment: (NOTE) The Coronavirus on the Respiratory Panel, DOES NOT test for the novel  Coronavirus (2019 nCoV)    Coronavirus HKU1 NOT DETECTED NOT DETECTED Final   Coronavirus NL63 NOT DETECTED NOT DETECTED Final   Coronavirus OC43 NOT DETECTED NOT DETECTED Final   Metapneumovirus NOT DETECTED NOT DETECTED Final   Rhinovirus / Enterovirus NOT DETECTED NOT DETECTED Final   Influenza A NOT DETECTED NOT DETECTED Final   Influenza B NOT DETECTED NOT DETECTED Final   Parainfluenza Virus 1 NOT DETECTED NOT DETECTED Final   Parainfluenza Virus 2 NOT DETECTED NOT DETECTED Final  Parainfluenza Virus 3 NOT DETECTED NOT DETECTED Final   Parainfluenza Virus 4 NOT DETECTED NOT DETECTED Final   Respiratory Syncytial Virus NOT DETECTED NOT DETECTED Final   Bordetella pertussis NOT DETECTED NOT DETECTED Final   Bordetella Parapertussis NOT DETECTED NOT DETECTED Final   Chlamydophila pneumoniae NOT DETECTED NOT DETECTED Final   Mycoplasma pneumoniae NOT DETECTED NOT DETECTED Final    Comment: Performed at [facility], [address], [place], [place] [address]  Expectorated Sputum Assessment w Gram Stain, Rflx to Resp Cult     Status: None   Collection Time: 11/06/23  7:03 AM   Specimen: Expectorated Sputum  Result Value Ref Range Status   Specimen Description EXPECTORATED SPUTUM  Final   Special Requests NONE  Final   Sputum evaluation   Final    THIS SPECIMEN IS ACCEPTABLE FOR SPUTUM CULTURE Performed at [facility], [address] [place], [place] [address]    Report Status 11/06/2023 FINAL  Final  Culture, Respiratory w Gram Stain     Status: None (Preliminary result)   Collection Time: 11/06/23  7:03 AM  Result Value Ref Range Status   Specimen Description EXPECTORATED SPUTUM  Final   Special Requests NONE Reflexed from [id number]   Final   Gram Stain   Final    RARE WBC SEEN RARE GRAM POSITIVE COCCI RARE GRAM NEGATIVE RODS Performed at [facility], [address], [place], [place] [address]    Culture PENDING  Incomplete   Report Status PENDING  Incomplete  MRSA Next Gen by PCR, Nasal     Status: None   Collection Time: 11/06/23 10:50 AM   Specimen: Nasal Mucosa; Nasal Swab  Result Value Ref Range Status   MRSA by PCR Next Gen NOT DETECTED NOT DETECTED Final    Comment: (NOTE) The GeneXpert MRSA Assay (FDA approved for NASAL specimens only), is one component of a comprehensive MRSA colonization surveillance program. It is not intended to diagnose MRSA infection nor to guide or monitor treatment for MRSA infections. Test performance is not FDA approved in patients less than 8 years old. Performed at [facility], [address], [place], [place] [address]          Radiology Studies: CT Angio Chest Pulmonary Embolism (PE) W or WO Contrast Result Date: 11/06/2023 CLINICAL DATA:  Pulmonary embolism (PE) suspected, high prob, cough, abnormal chest x-ray EXAM: CT ANGIOGRAPHY CHEST WITH CONTRAST TECHNIQUE: Multidetector CT imaging of the chest was performed using the standard protocol during bolus administration of intravenous contrast. Multiplanar CT image reconstructions and MIPs were obtained to evaluate the vascular anatomy. RADIATION DOSE REDUCTION: This exam was performed according to the departmental dose-optimization program which includes automated exposure control, adjustment of the mA and/or kV according to patient size and/or use of iterative reconstruction technique. CONTRAST:  75mL OMNIPAQUE  IOHEXOL  350 MG/ML SOLN COMPARISON:  07/02/2011, 11/05/2023 FINDINGS: Cardiovascular: This is a technically adequate evaluation of the pulmonary vasculature. No filling defects or pulmonary emboli. Dilated left atrium without overall cardiomegaly. Dense calcifications of the mitral and aortic valves. Dual  lead cardiac pacer, proximal lead in the right atrium and distal lead in the right ventricle. Stenosis of the left brachiocephalic vein likely due to indwelling pacer, with numerous venous collaterals throughout the chest wall. No evidence of thoracic aortic aneurysm or dissection. Atherosclerosis of the aorta and coronary vasculature. Mediastinum/Nodes: No enlarged mediastinal, hilar, or axillary lymph nodes. Thyroid  gland, trachea, and esophagus demonstrate no  significant findings. Lungs/Pleura: Widespread bilateral airspace disease again identified, most pronounced within the right upper lobe, favor multifocal infection over edema. There is small free-flowing bilateral pleural effusions. No pneumothorax. The central airways are patent. Upper Abdomen: No acute abnormality. Musculoskeletal: No acute or destructive bony abnormalities. Reconstructed images demonstrate no additional findings. Review of the MIP images confirms the above findings. IMPRESSION: 1. No evidence of pulmonary embolus. 2. Widespread bilateral airspace disease, favor multifocal pneumonia over edema. 3. Small free-flowing bilateral pleural effusions. 4. Aortic Atherosclerosis (ICD10-I70.0). Coronary artery atherosclerosis. Electronically Signed   By: Ozell Daring M.D.   On: 11/06/2023 11:14   DG Chest 2 View Result Date: 11/05/2023 CLINICAL DATA:  Cough EXAM: CHEST - 2 VIEW COMPARISON:  01/05/2022 FINDINGS: Left-sided pacing device as before. Low lung volumes. Stable cardiomediastinal silhouette. Chronic elevation right diaphragm. Patchy bilateral airspace opacities, favor bilateral pneumonia. No pleural effusion or pneumothorax. IMPRESSION: Patchy bilateral airspace opacities, favor bilateral pneumonia. Radiographic follow-up to resolution is recommended. Electronically Signed   By: Luke Bun M.D.   On: 11/05/2023 23:00        Scheduled Meds:  amLODipine   10 mg Oral Daily   atorvastatin   80 mg Oral Daily   carbamazepine   300  mg Oral BID   cloNIDine   0.2 mg Oral BID   donepezil   5 mg Oral QHS   escitalopram   5 mg Oral QHS   isosorbide  mononitrate  30 mg Oral Daily   levETIRAcetam   500 mg Oral BID   levothyroxine   100 mcg Oral Daily   losartan   50 mg Oral Daily   methylPREDNISolone  (SOLU-MEDROL ) injection  40 mg Intravenous Q12H   mirabegron  ER  50 mg Oral Daily   multivitamin with minerals  1 tablet Oral QHS   tamsulosin   0.4 mg Oral QPC supper   Continuous Infusions:  azithromycin      cefTRIAXone  (ROCEPHIN )  IV 2 g (11/06/23 1307)     LOS: 0 days    Time spent: 55 minutes.    Leatrice Chapel, MD  [facility] Pager #: [phone] 7PM-7AM contact night coverage as above

## 2023-11-06 NOTE — Significant Event (Signed)
 Rapid Response Event Note   Reason for Call :  Respiratory distress/increased oxygen demand since arriving to 62M this AM.  PTA RRT, pt was given albuterol  tx, 2mg  morphine , 40mg  lasix  IV, and order to tx to PCU was placed.   Initial Focused Assessment:  Pt sitting up in bed with eyes open. His breathing is tachypneic but not labored. He denies CP/dizziness. He says his SOB has improved since they gave him the above meds. Lungs diminished t/o. Skin hot to touch.  T-99.7(Ax), HR-84, RR-32, SpO2-98% on NRB, 94% on 15L HFNC(salter)  Interventions:  Albuterol , morphine  lasix  PTA RRT NRB>15L HFNC(salter) Tx to PCU.  Plan of Care:  Pt breathing better at this time. Allow lasix  time to work. May titrate HFNC for SpO2 >92%. Pt to tx to PCU when bed available. Please call RRT if further assistance needed.   Event Summary:   MD Notified: per bedside RN Call [phone] Arrival [phone] End [id number]  Tish Graeme Piety, RN

## 2023-11-06 NOTE — Progress Notes (Signed)
 PT Cancellation Note  Patient Details Name: [name] MRN: [id number] DOB: April 05, 1933   Cancelled Treatment:    Reason Eval/Treat Not Completed: Medical issues which prohibited therapy  Noting hypoxic event this am, and transfer to PCU;   Will follow up later today as time allows;  Otherwise, will follow up for PT tomorrow;   Thank you,  Silvano Currier, PT  Acute Rehabilitation Services Office [phone]    Silvano VEAR Currier 11/06/2023, 10:21 AM

## 2023-11-06 NOTE — H&P (Addendum)
 History and Physical    [name] FMW:[id number] DOB: 09/15/1933 DOA: 11/05/2023  PCP: Duanne Butler ONEIDA, MD  Patient coming from: home  I have personally briefly reviewed patient's old medical records in [facility]  Chief Complaint: DOE x 2 days   HPI: [name] is a 89 y.o. male with medical history significant of stage III melanoma of back, s/p neoadjuvant immunotherapy and resection. now receiving adjuvant immunotherapy , intermittent CHB s/p PPM, parox AFib, HTN, CAD, Chronic diastolic HF, CVA ,DMII, hypothyrodism,BPH,HLD, Seizure d/o ,Dementia,Gait disturbance who presents to ED with two days of DOE , coughing with blood tinged sputum . OF note pulse ox checked at home was noted to be in mid 80's per cardiology noted but improved to 24 's on deceased wife O2.  Patient currently noted continue sob and persistent hemoptysis. Per patient took xarelto  last dose yesterday am. Patient with no chest pain or fever.    ED Course:  Vitals: afeb, BP150/90, hr 92, rr 22, sat 90%  EKG: Atrial flutter 91, paced CE:51 Na 138, K 4, Cl 104, Co23, Glu195, cr 1.04, AST46 Wbc:10.1, hgb:10.1, plt 306,  RVP neg BNP 450.6 CE59 Lactic 1.3 CXR: IMPRESSION: Patchy bilateral airspace opacities, favor bilateral pneumonia. Radiographic follow-up to resolution is recommended.   Tx ctx, Azithromycin    Review of Systems: As per HPI otherwise 10 point review of systems negative.   Past Medical History:  Diagnosis Date   Atrial flutter (HCC)    BPH (benign prostatic hyperplasia)    Coronary artery disease    a. s/p MI tx with Cypher DES to pRCA in 4/04;  b. Echocardiogram 7/09: Normal LV function.  c. Nuclear study 3/13 no ischemia;  d. ETT 2/14 neg;  e. admx with CP => LHC (01/30/2013):  pLAD 40-50%, oD1 70-80%, oOM1 40%, pRCA stent patent, mid RCA 30%, EF 60-65%. => med Rx.   DDD (degenerative disc disease)    Diabetes mellitus without complication (HCC)    Dyslipidemia    Gait  disorder 07/13/2018   Hemorrhoids    Hx MRSA infection    left buttocks abscess   Hx of echocardiogram    a. Echocardiogram (01/31/2013): Mild focal basal and mild concentric hypertrophy of the septum, EF 50-55%, normal wall motion, grade 1 diastolic dysfunction, trivial AI, MAC, mild LAE, PASP 35   Hyperlipidemia    Hypertension    Hypothyroidism    Meniere's disease    Status post shunt   Myocardial infarction [facility]) 2008   Occlusion and stenosis of carotid artery without mention of cerebral infarction    40-59% on carotid doppler 2014; US  (01/2013): R 1-39%, L 60-79%   Rotator cuff injury    chronic rotator cuff injury status post repair   Seizure disorder (HCC)    Stroke (HCC)    a. 01/2013=> post cardiac cath CVA to L post communicating artery system; R sided weakness   Syncope     Past Surgical History:  Procedure Laterality Date   CARDIOVERSION N/A 05/26/2022   Procedure: CARDIOVERSION;  Surgeon: Hobart Powell BRAVO, MD;  Location: [facility] ENDOSCOPY;  Service: Cardiovascular;  Laterality: N/A;   COLONOSCOPY     CORONARY ANGIOPLASTY WITH STENT PLACEMENT  07/14/2002   Stent to the right coronary artery   ENDOLYMPHATIC SHUNT DECOMPRESSION  06/11/2009    Right endolymphatic sac decompression and shunt  placement   HERNIA REPAIR  04/10/2008   scrotal hernia repair   LEFT HEART CATHETERIZATION WITH CORONARY ANGIOGRAM N/A  01/30/2013   Procedure: LEFT HEART CATHETERIZATION WITH CORONARY ANGIOGRAM;  Surgeon: Ezra GORMAN Shuck, MD;  Location: [facility] CATH LAB;  Service: Cardiovascular;  Laterality: N/A;   PACEMAKER IMPLANT N/A 08/29/2020   Procedure: PACEMAKER IMPLANT;  Surgeon: Waddell Danelle ORN, MD;  Location: [facility] INVASIVE CV LAB;  Service: Cardiovascular;  Laterality: N/A;   ROTATOR CUFF REPAIR     for chronic rotator cuff injury   TEE WITHOUT CARDIOVERSION N/A 05/26/2022   Procedure: TRANSESOPHAGEAL ECHOCARDIOGRAM (TEE);  Surgeon: Hobart Powell BRAVO, MD;  Location: [facility] ENDOSCOPY;  Service:  Cardiovascular;  Laterality: N/A;     reports that he quit smoking about 67 years ago. His smoking use included cigarettes. He started smoking about 72 years ago. He has quit using smokeless tobacco.  His smokeless tobacco use included chew. He reports that he does not drink alcohol and does not use drugs.  Allergies  Allergen Reactions   Penicillins     Did it involve swelling of the face/tongue/throat, SOB, or low BP? Unknown Did it involve sudden or severe rash/hives, skin peeling, or any reaction on the inside of your mouth or nose? Unknown Did you need to seek medical attention at a hospital or doctor's office? No When did it last happen?      Decades Ago If all above answers are NO, may proceed with cephalosporin use.      Family History  Problem Relation Age of Onset   Heart attack Father 41   Aneurysm Mother        brain aneurysm   Coronary artery disease Brother        with CABG   Diabetes Brother    Colon cancer Neg Hx    Colon polyps Neg Hx     Prior to Admission medications   Medication Sig Start Date End Date Taking? Authorizing Provider  acetaminophen  (TYLENOL ) 325 MG tablet Take 1-2 tablets (325-650 mg total) by mouth every 4 (four) hours as needed for mild pain. 06/15/18  Yes Love, Sharlet GORMAN, PA-C  amLODipine  (NORVASC ) 10 MG tablet Take 1 tablet (10 mg total) by mouth daily. 06/06/23  Yes Duanne Butler DASEN, MD  atorvastatin  (LIPITOR ) 80 MG tablet TAKE 1 TABLET BY MOUTH EVERY DAY AT 6 PM 06/06/23  Yes Duanne Butler DASEN, MD  carbamazepine  (CARBATROL ) 300 MG 12 hr capsule Take 1 capsule (300 mg total) by mouth 2 (two) times daily. 06/06/23  Yes Duanne Butler DASEN, MD  cloNIDine  (CATAPRES ) 0.2 MG tablet Take 1 tablet (0.2 mg total) by mouth 2 (two) times daily. 06/06/23  Yes Duanne Butler DASEN, MD  donepezil  (ARICEPT ) 5 MG tablet Take 1 tablet (5 mg total) by mouth at bedtime. 06/06/23  Yes Duanne Butler DASEN, MD  escitalopram  (LEXAPRO ) 5 MG tablet Take 1 tablet (5 mg total)  by mouth at bedtime. 06/06/23  Yes Duanne Butler DASEN, MD  furosemide  (LASIX ) 20 MG tablet TAKE 1/2 TABLET(10 MG) BY MOUTH DAILY 06/06/23  Yes Duanne Butler DASEN, MD  isosorbide  mononitrate (IMDUR ) 60 MG 24 hr tablet Take 0.5 tablets (30 mg total) by mouth daily. 06/06/23  Yes Duanne Butler DASEN, MD  levETIRAcetam  (KEPPRA ) 500 MG tablet Take 1 tablet (500 mg total) by mouth 2 (two) times daily. 06/06/23  Yes Duanne Butler DASEN, MD  levothyroxine  (SYNTHROID ) 100 MCG tablet Take 1 tablet (100 mcg total) by mouth daily. 07/28/23  Yes Duanne Butler DASEN, MD  losartan  (COZAAR ) 100 MG tablet Take 0.5 tablets (50 mg total) by mouth daily. 06/06/23  Yes Duanne Butler DASEN, MD  Multiple Vitamins-Minerals (MULTIVITAMINS THER. W/MINERALS) TABS Take 1 tablet by mouth at bedtime.     Yes [provider]  MYRBETRIQ  50 MG TB24 tablet Take 1 tablet (50 mg total) by mouth daily. 06/06/23  Yes Duanne Butler DASEN, MD  tamsulosin  (FLOMAX ) 0.4 MG CAPS capsule Take 1 capsule (0.4 mg total) by mouth daily after supper. 06/06/23  Yes Duanne Butler DASEN, MD  XARELTO  20 MG TABS tablet TAKE 1 TABLET(20 MG) BY MOUTH DAILY WITH SUPPER 10/21/23  Yes Hochrein, Lynwood, MD  glucose blood test strip 1 each by Other route as needed. Use as instructed 08/25/18   Bari Theodoro FALCON, MD  glucose monitoring kit (FREESTYLE) monitoring kit 1 each by Does not apply route as needed. 08/25/18   Bari Theodoro FALCON, MD  Lancets (ACCU-CHEK SAFE-T PRO) lancets 1 each by Other route as needed. Use as instructed 08/25/18   Bari Theodoro FALCON, MD    Physical Exam: Vitals:   11/06/23 0021 11/06/23 0030 11/06/23 0100 11/06/23 0130  BP:  (!) 188/77 (!) 177/109 (!) 200/85  Pulse:  63 82 88  Resp:  (!) 30 (!) 30 (!) 26  Temp: 98.4 F (36.9 C)     TempSrc: Oral     SpO2:  96% 94% 94%  Weight:      Height:        Constitutional: NAD, calm, comfortable Vitals:   11/06/23 0021 11/06/23 0030 11/06/23 0100 11/06/23 0130  BP:  (!) 188/77 (!) 177/109 (!) 200/85   Pulse:  63 82 88  Resp:  (!) 30 (!) 30 (!) 26  Temp: 98.4 F (36.9 C)     TempSrc: Oral     SpO2:  96% 94% 94%  Weight:      Height:       Eyes: pupils, lids and conjunctivae normal ENMT: Mucous membranes are dry Neck: normal, supple, no masses, no thyromegaly Respiratory: diffuse crackles. Mod increase wob. Some accessory muscle use.  Cardiovascular: Regular rate and rhythm, no murmurs / rubs / gallops. No extremity edema. 2+ pedal pulses.  Abdomen: no tenderness, no masses palpated. No hepatosplenomegaly. Bowel sounds positive.  Musculoskeletal: no clubbing / cyanosis. No joint deformity upper and lower extremities. Good ROM, no contractures. Normal muscle tone.  Skin: no rashes, lesions, ulcers. No induration Neurologic: CN grossly intact. Sensation intact, Strength 5/5 in all 4.  Psychiatric: Normal judgment and insight. Alert and oriented x 3. Normal mood.    Labs on Admission: I have personally reviewed following labs and imaging studies  CBC: Recent Labs  Lab 11/05/23 2357  WBC 10.1  NEUTROABS 6.9  HGB 10.1*  HCT 31.9*  MCV 95.2  PLT 306   Basic Metabolic Panel: Recent Labs  Lab 11/05/23 2154  NA 138  K 4.0  CL 104  CO2 23  GLUCOSE 195*  BUN 20  CREATININE 1.04  CALCIUM  8.8*   GFR: Estimated Creatinine Clearance: 47.5 mL/min (by C-G formula based on SCr of 1.04 mg/dL). Liver Function Tests: Recent Labs  Lab 11/05/23 2154  AST 46*  ALT 30  ALKPHOS 76  BILITOT 0.7  PROT 6.8  ALBUMIN 3.0*   No results for input(s): LIPASE, AMYLASE in the last 168 hours. No results for input(s): AMMONIA in the last 168 hours. Coagulation Profile: No results for input(s): INR, PROTIME in the last 168 hours. Cardiac Enzymes: No results for input(s): CKTOTAL, CKMB, CKMBINDEX, TROPONINI in the last 168 hours. BNP (last 3 results) No  results for input(s): PROBNP in the last 8760 hours. HbA1C: No results for input(s): HGBA1C in the last 72  hours. CBG: No results for input(s): GLUCAP in the last 168 hours. Lipid Profile: No results for input(s): CHOL, HDL, LDLCALC, TRIG, CHOLHDL, LDLDIRECT in the last 72 hours. Thyroid  Function Tests: No results for input(s): TSH, T4TOTAL, FREET4, T3FREE, THYROIDAB in the last 72 hours. Anemia Panel: No results for input(s): VITAMINB12, FOLATE, FERRITIN, TIBC, IRON, RETICCTPCT in the last 72 hours. Urine analysis:    Component Value Date/Time   COLORURINE YELLOW 01/05/2022 1801   APPEARANCEUR CLEAR 01/05/2022 1801   LABSPEC 1.011 01/05/2022 1801   PHURINE 6.0 01/05/2022 1801   GLUCOSEU NEGATIVE 01/05/2022 1801   HGBUR NEGATIVE 01/05/2022 1801   BILIRUBINUR NEGATIVE 01/05/2022 1801   KETONESUR NEGATIVE 01/05/2022 1801   PROTEINUR NEGATIVE 01/05/2022 1801   UROBILINOGEN 0.2 10/22/2013 1451   NITRITE NEGATIVE 01/05/2022 1801   LEUKOCYTESUR NEGATIVE 01/05/2022 1801    Radiological Exams on Admission: DG Chest 2 View Result Date: 11/05/2023 CLINICAL DATA:  Cough EXAM: CHEST - 2 VIEW COMPARISON:  01/05/2022 FINDINGS: Left-sided pacing device as before. Low lung volumes. Stable cardiomediastinal silhouette. Chronic elevation right diaphragm. Patchy bilateral airspace opacities, favor bilateral pneumonia. No pleural effusion or pneumothorax. IMPRESSION: Patchy bilateral airspace opacities, favor bilateral pneumonia. Radiographic follow-up to resolution is recommended. Electronically Signed   By: Luke Bun M.D.   On: 11/05/2023 23:00    EKG: Independently reviewed.   Assessment/Plan  CAP with acute hypoxic respiratory failure -patient with increase wbc and  Opacities on cxr unable to r/o infection  -ctx/ azithromycin , de-escalate as able  -pulmonary toilet  -urine ag, sputum, f/u on culture data  -full RVP pending -wean o2 to baseline   Hemoptysis - note blood admixed with sputum  - concern for possible pulmonary hemorrhage -CTPE - pulmonary  consult evaluate role for TXA nebs  Anemia -hgb 10.1 down form 11.1 - monitor h/h  -transfuse if <7  -repeat h/h mow and q6 hours    Stage III Melanoma -on immunotherapy   Abnormal CE Hx of CAD - presumed due to demand  -no chest pain  -resume imdur  as tolerated and atrovastatin -echo in am  Hx of P Afib  - noted cvr  - hold OAC  CHFpef -patient on low dose lasix   - bnp elevated from baseline  -trial of lasix  in setting of progressive hypoxemia -resume low dose lasix  in am vs iv lasix  base on f/u clinical picuture p  Hypertension, Uncontrolled -resume home regimen with amlodipine ,clonidine ,losartan  -if bp and renal function remain at baseline.   CVA -holding secondary ppx at this time  DMII -iss/fs   Seizure d/o  -continue keppra , carbamazepine    Dementia  -resume aricept   Gait disturbance -PT  Depression Lexapro   Social -wife passed yesterday - social work to follow      DVT prophylaxis: scd Code Status: full/ as discussed per patient wishes in event of cardiac arrest Consult: pulmonary : discussed with B[place] Lesches Family Communication: family at bedside poc discussed  Disposition Plan: full/ as discussed per patient wishes in event of cardiac arrest  Consults called: n/a Admission status: cardiac tele   Camila DELENA Ned MD [facility]  If 7PM-7AM, please contact night-coverage [email or web address] Password [email or web address]  11/06/2023, 2:19 AM

## 2023-11-06 NOTE — Consult Note (Addendum)
   NAME:  [name], MRN:  [id number], DOB:  [birth year], LOS: 0 ADMISSION DATE:  11/05/2023, CONSULTATION DATE:  11/06/2023 REFERRING MD:  Dr. Debby - [facility], CHIEF COMPLAINT: Mopped assist  History of Present Illness:  [name] is a 89 year old male with a past medical history significant for stage III melanoma of back s/p neoadjunctive immunotherapy and resection, intermittent heart block s/p PPM, paroxysmal atrial fibrillation anticoagulated with Xarelto , HTN, CAD, HFpEF, CVA, HLD, hypothyroidism, and BPH who presented to the ED at [facility] 8/16 for complaints of dyspnea on exertion that began 2 days prior to admission.  Additionally reported coughing up blood tinged sputum and hypoxia observed on home oximetry.  Patient was admitted per hospitalist for management of bilateral community-acquired pneumonia with associated hypoxia and mild hemoptysis.  Morning of 8/17 pulmonary critical care consulted to assist in managing mild hemoptysis.  Pertinent  Medical History  Stage III melanoma of back s/p neoadjunctive immunotherapy and resection, intermittent heart block s/p PPM, paroxysmal atrial fibrillation anticoagulated with Xarelto , HTN, CAD, HFpEF, CVA, HLD, hypothyroidism, and BPH   Significant Hospital Events: Including procedures, antibiotic start and stop dates in addition to other pertinent events   8/16 admitted per hospitalist for bilateral community-acquired pneumonia with hypoxia 8/17 PCCM consulted for assistance in managing hypoxia  Interim History / Subjective:    Objective    Blood pressure (!) 194/91, pulse 93, temperature 99.7 F (37.6 C), temperature source Axillary, resp. rate (!) 32, height 5' 8 (1.727 m), weight 80.7 kg, SpO2 94%.        Intake/Output Summary (Last 24 hours) at 11/06/2023 0759 Last data filed at 11/06/2023 0500 Gross per 24 hour  Intake 120 ml  Output --  Net 120 ml   Filed Weights   11/05/23 2118  Weight: 80.7 kg     Examination: Gen:      No acute distress, chronically ill-appearing HEENT:  EOMI, sclera anicteric Neck:     No masses; no thyromegaly Lungs:    Scattered rhonchi CV:         Regular rate and rhythm; no murmurs Abd:      + bowel sounds; soft, non-tender; no palpable masses, no distension Ext:    No edema; adequate peripheral perfusion Neuro: alert and oriented x 3 Psych: normal mood and affect   Labs/imaging reviewed Chest x-ray with patchy bilateral airspace disease WBC 14.4, hemoglobin 10.7, platelets 298  Resolved problem list   Assessment and Plan  Acute hypoxic respiratory failure in the setting of bilateral pulmonary infiltrates Mild hemoptysis in the setting of anticoagulation 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  Has negative procalcitonin and respiratory virus panel P: Continue supplemental oxygen for sat goal greater than 92 Continue empiric ceftriaxone , azithromycin  Add Solu-Medrol  40 mg Q12 Agree with obtaining CTA chest, check INR TXA nebs as needed for management of hemoptysis  Apixaban  on hold Aspiration precautions Elevate head of bed Encourage pulmonary hygiene As needed antitussives Defer bronchoscopy due to high O2 requirements, advanced age and frailty.  Best Practice (right click and Reselect all SmartList Selections daily)   Per primary  Signature:   Rylon Poitra MD [facility] See Amion for pager  If no response to pager , please call [phone] until 7pm After 7:00 pm call Elink  [phone] 11/06/2023, 9:06 AM

## 2023-11-06 NOTE — ED Notes (Signed)
 Pt is requesting anxiety medication, MD made aware

## 2023-11-07 ENCOUNTER — Inpatient Hospital Stay (HOSPITAL_COMMUNITY)

## 2023-11-07 ENCOUNTER — Telehealth: Payer: Self-pay | Admitting: Nurse Practitioner

## 2023-11-07 DIAGNOSIS — J9601 Acute respiratory failure with hypoxia: Secondary | ICD-10-CM

## 2023-11-07 DIAGNOSIS — R7989 Other specified abnormal findings of blood chemistry: Secondary | ICD-10-CM | POA: Diagnosis not present

## 2023-11-07 DIAGNOSIS — J984 Other disorders of lung: Secondary | ICD-10-CM | POA: Diagnosis not present

## 2023-11-07 DIAGNOSIS — J189 Pneumonia, unspecified organism: Secondary | ICD-10-CM | POA: Diagnosis not present

## 2023-11-07 DIAGNOSIS — I509 Heart failure, unspecified: Secondary | ICD-10-CM

## 2023-11-07 DIAGNOSIS — R042 Hemoptysis: Secondary | ICD-10-CM | POA: Diagnosis not present

## 2023-11-07 LAB — RENAL FUNCTION PANEL
Albumin: 2.6 g/dL — ABNORMAL LOW (ref 3.5–5.0)
Anion gap: 15 (ref 5–15)
BUN: 23 mg/dL (ref 8–23)
CO2: 22 mmol/L (ref 22–32)
Calcium: 8.6 mg/dL — ABNORMAL LOW (ref 8.9–10.3)
Chloride: 102 mmol/L (ref 98–111)
Creatinine, Ser: 1.22 mg/dL (ref 0.61–1.24)
GFR, Estimated: 57 mL/min — ABNORMAL LOW (ref >60)
Glucose, Bld: 171 mg/dL — ABNORMAL HIGH (ref 70–99)
Phosphorus: 4.2 mg/dL (ref 2.5–4.6)
Potassium: 3.6 mmol/L (ref 3.5–5.1)
Sodium: 139 mmol/L (ref 135–145)

## 2023-11-07 LAB — CBC WITH DIFFERENTIAL/PLATELET
Abs Immature Granulocytes: 0.07 K/uL (ref 0.00–0.07)
Basophils Absolute: 0 K/uL (ref 0.0–0.1)
Basophils Relative: 0 %
Eosinophils Absolute: 0 K/uL (ref 0.0–0.5)
Eosinophils Relative: 0 %
HCT: 31.7 % — ABNORMAL LOW (ref 39.0–52.0)
Hemoglobin: 10.2 g/dL — ABNORMAL LOW (ref 13.0–17.0)
Immature Granulocytes: 1 %
Lymphocytes Relative: 6 %
Lymphs Abs: 0.7 K/uL (ref 0.7–4.0)
MCH: 30.5 pg (ref 26.0–34.0)
MCHC: 32.2 g/dL (ref 30.0–36.0)
MCV: 94.9 fL (ref 80.0–100.0)
Monocytes Absolute: 0.9 K/uL (ref 0.1–1.0)
Monocytes Relative: 8 %
Neutro Abs: 9.5 K/uL — ABNORMAL HIGH (ref 1.7–7.7)
Neutrophils Relative %: 85 %
Platelets: 268 K/uL (ref 150–400)
RBC: 3.34 MIL/uL — ABNORMAL LOW (ref 4.22–5.81)
RDW: 15.6 % — ABNORMAL HIGH (ref 11.5–15.5)
WBC: 11.2 K/uL — ABNORMAL HIGH (ref 4.0–10.5)
nRBC: 0 % (ref 0.0–0.2)

## 2023-11-07 LAB — ECHOCARDIOGRAM COMPLETE
Area-P 1/2: 5.13 cm2
Height: 68 in
MV VTI: 1.4 cm2
S' Lateral: 3 cm
Weight: 2848 [oz_av]

## 2023-11-07 LAB — MAGNESIUM: Magnesium: 1.9 mg/dL (ref 1.7–2.4)

## 2023-11-07 MED ORDER — PERFLUTREN LIPID MICROSPHERE
1.0000 mL | INTRAVENOUS | Status: AC | PRN
  Administered 2023-11-07: 4 mL via INTRAVENOUS

## 2023-11-07 MED ORDER — IPRATROPIUM-ALBUTEROL 0.5-2.5 (3) MG/3ML IN SOLN
3.0000 mL | RESPIRATORY_TRACT | Status: DC
  Administered 2023-11-07 – 2023-11-09 (×9): 3 mL via RESPIRATORY_TRACT
  Filled 2023-11-07 (×9): qty 3

## 2023-11-07 MED ORDER — GUAIFENESIN-DM 100-10 MG/5ML PO SYRP
5.0000 mL | ORAL_SOLUTION | ORAL | Status: DC | PRN
  Administered 2023-11-07 – 2023-11-08 (×2): 5 mL via ORAL
  Filled 2023-11-07: qty 5

## 2023-11-07 NOTE — Progress Notes (Signed)
 Speech Language Pathology Treatment: Dysphagia  Patient Details Name: [name] MRN: [id number] DOB: [birth year] Today's Date: 11/07/2023 Time: 8883-8872 SLP Time Calculation (min) (ACUTE ONLY): 11 min  Assessment / Plan / Recommendation Clinical Impression  Pt completing heart ultrasound on arrival.  Daughter in room and brother had arrived for visit as well. Reviewed MBSS imaging in room with family.  Provided diet recommendations.  Pt's wife (who died 03-Dec-2023 morning prior to admission) has been on thickened liquids.  Pt's granddaughter is a CNA who has been caring for him and wife. She was the one who noted some increased coughing with PO intake.  Given frank, silent aspiration on MBSS, suspect these are larger volume aspiration events.  Discussed how to manage dysphagia.  Pt to initiation modified liquids. Hopeful for improvement as overall health improves this admission. Pt may not be able to reliably participate in swallowing therapy, but will trial swallow exercises.  He may benefit from RMST to improve cough strength if he is able to participate in therapy, but cognition may be prohibitive.  Pt would need repeat instrumental to advance liquid consistencies given observed silent aspiration.  Daughter is in agreement with treatment plan.     HPI HPI: [name]  is an 89 year old male who was admitted with shortness of breath, with O2 sat in the 80s and production of phlegm that is blood-tinged. CXT 8/16 with B opacities/pna.  Chest CT 8/17: Widespread bilateral airspace disease, favor multifocal pneumonia  over edema.  Pt with past medical history significant for atrial flutter, BPH, coronary artery disease, diabetes mellitus, dyslipidemia, myocardial infarction, occlusion and stenosis of the carotid artery, seizure disorder, stroke, stage III melanoma of the back status post neoadjuvant immunotherapy and resection, intermittent complete heart block status post pacemaker placement,  paroxysmal atrial fibrillation on Xarelto , gait imbalance, documented dementia and diastolic CHF.      SLP Plan  Continue with current plan of care          Recommendations  Diet recommendations: Regular;Nectar-thick liquid Liquids provided via: Cup;Straw Medication Administration: Whole meds with liquid Supervision: Patient able to self feed;Staff to assist with self feeding Compensations: Slow rate;Small sips/bites Postural Changes and/or Swallow Maneuvers: Seated upright 90 degrees;Upright 30-60 min after meal                  Oral care BID     Dysphagia, oropharyngeal phase (R13.12)     Continue with current plan of care     Anette FORBES Grippe, MA, CCC-SLP Acute Rehabilitation Services Office: [phone] 11/07/2023, 11:44 AM

## 2023-11-07 NOTE — Progress Notes (Signed)
  Echocardiogram 2D Echocardiogram has been performed.  Koleen KANDICE Popper, RDCS 11/07/2023, 11:39 AM

## 2023-11-07 NOTE — Procedures (Signed)
 Modified Barium Swallow Study  Patient Details  Name: [name] MRN: [id number] Date of Birth: 07-26-1933  Today's Date: 11/07/2023  Modified Barium Swallow completed.  Full report located under Chart Review in the Imaging Section.  History of Present Illness [name]  is an 89 year old male who was admitted with shortness of breath, with O2 sat in the 80s and production of phlegm that is blood-tinged. CXT 8/16 with B opacities/pna.  Chest CT 8/17: Widespread bilateral airspace disease, favor multifocal pneumonia  over edema.  Pt with past medical history significant for atrial flutter, BPH, coronary artery disease, diabetes mellitus, dyslipidemia, myocardial infarction, occlusion and stenosis of the carotid artery, seizure disorder, stroke, stage III melanoma of the back status post neoadjuvant immunotherapy and resection, intermittent complete heart block status post pacemaker placement, paroxysmal atrial fibrillation on Xarelto , gait imbalance, documented dementia and diastolic CHF.   Clinical Impression Pt presents with a moderate oropharyngeal dysphagia c/b delayed swallow initiation, reduced hyolaryngeal elevation and excursion, incomplete laryngeal closure, reduced UES opening, and diminished sensation. These deficts resulted in intermittent silent aspiration of thin liquid.  Pt was unable to clear aspiration with cued cough which was very weak and ineffective.  Straw presentation improved oral clearance of liquids as holding was noted prior to large aspiration event, but did not prevent silent penetration.  There was no penetration or aspiration with nectar or honey thick liquids.  Pt exhibited good oral and pharyngeal clearance of solid and puree textures without any penetration or aspiration.  During esophageal sweep ther ewas retention and backflow of contrast within the esophagus.  There was retention of trace amounts of contrast in cervical esophagus with possible CP bar v  osteophyte v cervical hyper extentsion (see image below).    Recommend regular diet with nectar/mildly thick liquids.  DIGEST Swallow Severity Rating*  Safety: 2  Efficiency: 1  Overall Pharyngeal Swallow Severity: 2 1: mild; 2: moderate; 3: severe; 4: profound  *The Dynamic Imaging Grade of Swallowing Toxicity is standardized for the head and neck cancer population, however, demonstrates promising clinical applications across populations to standardize the clinical rating of pharyngeal swallow safety and severity.  Retention of contrast in cervical esophagus 2/2 cp bar v osteophyte v cervical hyperextension   Factors that may increase risk of adverse event in presence of aspiration Noe & Lianne 2021): Reduced cognitive function;Weak cough  Swallow Evaluation Recommendations Recommendations: PO diet PO Diet Recommendation: Regular;Mildly thick liquids (Level 2, nectar thick) Liquid Administration via: Cup;Straw Medication Administration: Whole meds with liquid Supervision: Staff to assist with self-feeding (as needed) Swallowing strategies  : Slow rate;Small bites/sips Postural changes: Position pt fully upright for meals;Stay upright 30-60 min after meals Oral care recommendations: Oral care BID (2x/day)      Daniel FORBES Grippe, MA, CCC-SLP Acute Rehabilitation Services Office: [phone] 11/07/2023,11:07 AM

## 2023-11-07 NOTE — Evaluation (Signed)
 Clinical/Bedside Swallow Evaluation Patient Details  Name: ROBEN SCHLIEP MRN: [id number] Date of Birth: Oct 05, 1933  Today's Date: 11/07/2023 Time: SLP Start Time (ACUTE ONLY): 9062 SLP Stop Time (ACUTE ONLY): 0948 SLP Time Calculation (min) (ACUTE ONLY): 11 min  Past Medical History:  Past Medical History:  Diagnosis Date   Atrial flutter (HCC)    BPH (benign prostatic hyperplasia)    Coronary artery disease    a. s/p MI tx with Cypher DES to pRCA in 4/04;  b. Echocardiogram 7/09: Normal LV function.  c. Nuclear study 3/13 no ischemia;  d. ETT 2/14 neg;  e. admx with CP => LHC (01/30/2013):  pLAD 40-50%, oD1 70-80%, oOM1 40%, pRCA stent patent, mid RCA 30%, EF 60-65%. => med Rx.   DDD (degenerative disc disease)    Diabetes mellitus without complication (HCC)    Dyslipidemia    Gait disorder 07/13/2018   Hemorrhoids    Hx MRSA infection    left buttocks abscess   Hx of echocardiogram    a. Echocardiogram (01/31/2013): Mild focal basal and mild concentric hypertrophy of the septum, EF 50-55%, normal wall motion, grade 1 diastolic dysfunction, trivial AI, MAC, mild LAE, PASP 35   Hyperlipidemia    Hypertension    Hypothyroidism    Meniere's disease    Status post shunt   Myocardial infarction [facility]) 2008   Occlusion and stenosis of carotid artery without mention of cerebral infarction    40-59% on carotid doppler 2014; US  (01/2013): R 1-39%, L 60-79%   Rotator cuff injury    chronic rotator cuff injury status post repair   Seizure disorder (HCC)    Stroke (HCC)    a. 01/2013=> post cardiac cath CVA to L post communicating artery system; R sided weakness   Syncope    Past Surgical History:  Past Surgical History:  Procedure Laterality Date   CARDIOVERSION N/A 05/26/2022   Procedure: CARDIOVERSION;  Surgeon: Hobart Powell BRAVO, MD;  Location: [facility] ENDOSCOPY;  Service: Cardiovascular;  Laterality: N/A;   COLONOSCOPY     CORONARY ANGIOPLASTY WITH STENT PLACEMENT  07/14/2002    Stent to the right coronary artery   ENDOLYMPHATIC SHUNT DECOMPRESSION  06/11/2009    Right endolymphatic sac decompression and shunt  placement   HERNIA REPAIR  04/10/2008   scrotal hernia repair   LEFT HEART CATHETERIZATION WITH CORONARY ANGIOGRAM N/A 01/30/2013   Procedure: LEFT HEART CATHETERIZATION WITH CORONARY ANGIOGRAM;  Surgeon: Ezra GORMAN Shuck, MD;  Location: [facility] CATH LAB;  Service: Cardiovascular;  Laterality: N/A;   PACEMAKER IMPLANT N/A 08/29/2020   Procedure: PACEMAKER IMPLANT;  Surgeon: Waddell Danelle ORN, MD;  Location: [facility] INVASIVE CV LAB;  Service: Cardiovascular;  Laterality: N/A;   ROTATOR CUFF REPAIR     for chronic rotator cuff injury   TEE WITHOUT CARDIOVERSION N/A 05/26/2022   Procedure: TRANSESOPHAGEAL ECHOCARDIOGRAM (TEE);  Surgeon: Hobart Powell BRAVO, MD;  Location: [facility] ENDOSCOPY;  Service: Cardiovascular;  Laterality: N/A;   HPI:  Harshan Kearley  is an 89 year old male who was admitted with shortness of breath, with O2 sat in the 80s and production of phlegm that is blood-tinged. CXT 8/16 with B opacities/pna.  Chest CT 8/17: Widespread bilateral airspace disease, favor multifocal pneumonia  over edema.  Pt with past medical history significant for atrial flutter, BPH, coronary artery disease, diabetes mellitus, dyslipidemia, myocardial infarction, occlusion and stenosis of the carotid artery, seizure disorder, stroke, stage III melanoma of the back status post neoadjuvant immunotherapy and resection, intermittent complete heart block  status post pacemaker placement, paroxysmal atrial fibrillation on Xarelto , gait imbalance, documented dementia and diastolic CHF.    Assessment / Plan / Recommendation  Clinical Impression  Pt presents with clinical indicators of pharyngeal dysphagia.  Pt consistently required multiple swallows with single sips of thin liquid.  Vocal quality remained clear, but with low intensity, weak sounding voice. On arrival pt with productive cough  expectorating secretions orally (consider setting up suction), but volitional cough was very weak.  Pt with chest imaging concerning for pna.  Daughter reports pneumonitis 8/7 which was attributed to immunotherapy. Prior CVA without dysphagia, seen for cognitive intervention with CIR in 2020.  RN reported coughing, red face with thin liquid this morning. And daughter reports that her niece (pt's granddaughter) has observed similar.  Discussed family preferences and they are agreeable to MBSS. Will restart diet given goot tolerance of POs outside of double swallow with thin liquids. Pt exhibited good oral clearance of solids.  Will plan for instrumental study later this date.  Recommend regular diet with thin liquids pending results of MBS. SLP Visit Diagnosis: Dysphagia, unspecified (R13.10)    Aspiration Risk  Mild aspiration risk    Diet Recommendation Regular;Thin liquid    Liquid Administration via: Cup;Straw Medication Administration: Whole meds with puree Supervision: Patient able to self feed;Staff to assist with self feeding Compensations: Slow rate;Small sips/bites Postural Changes: Seated upright at 90 degrees    Other  Recommendations Oral Care Recommendations: Oral care BID     Assistance Recommended at Discharge  N/A  Functional Status Assessment  (TBD)  Frequency and Duration  (TBD)          Prognosis Prognosis for improved oropharyngeal function:  (TBD)      Swallow Study   General Date of Onset: 11/05/23 HPI: Kanan Sobek  is an 89 year old male who was admitted with shortness of breath, with O2 sat in the 80s and production of phlegm that is blood-tinged. CXT 8/16 with B opacities/pna.  Chest CT 8/17: Widespread bilateral airspace disease, favor multifocal pneumonia  over edema.  Pt with past medical history significant for atrial flutter, BPH, coronary artery disease, diabetes mellitus, dyslipidemia, myocardial infarction, occlusion and stenosis of the carotid  artery, seizure disorder, stroke, stage III melanoma of the back status post neoadjuvant immunotherapy and resection, intermittent complete heart block status post pacemaker placement, paroxysmal atrial fibrillation on Xarelto , gait imbalance, documented dementia and diastolic CHF. Type of Study: Bedside Swallow Evaluation Previous Swallow Assessment: none, seen by CIR in 2020 for cognition Diet Prior to this Study: NPO Temperature Spikes Noted: No Respiratory Status: Nasal cannula History of Recent Intubation: No Behavior/Cognition: Alert;Cooperative;Pleasant mood Oral Cavity Assessment: Within Functional Limits Oral Care Completed by SLP: No Oral Cavity - Dentition: Adequate natural dentition;Missing dentition (Lower R) Patient Positioning: Upright in bed Baseline Vocal Quality: Low vocal intensity Volitional Cough: Weak (strong reflexive cough with oral expectoration of secretions)    Oral/Motor/Sensory Function Overall Oral Motor/Sensory Function: Mild impairment Facial ROM: Reduced left Facial Symmetry: Abnormal symmetry left Lingual ROM: Within Functional Limits Lingual Symmetry: Within Functional Limits Lingual Strength: Within Functional Limits Velum: Within Functional Limits Mandible: Within Functional Limits   Ice Chips Ice chips: Not tested   Thin Liquid Thin Liquid: Impaired Pharyngeal  Phase Impairments: Multiple swallows    Nectar Thick Nectar Thick Liquid: Not tested   Honey Thick Honey Thick Liquid: Not tested   Puree Puree: Within functional limits Presentation: Spoon   Solid     Solid: Within functional  limits Presentation: Spoon      Anette FORBES Grippe, MA, CCC-SLP Acute Rehabilitation Services Office: [phone] 11/07/2023,10:11 AM

## 2023-11-07 NOTE — Telephone Encounter (Signed)
   Pts dtr called to ask if we were aware that he was hospitalized at [facility] w/ pneumonia.  He is currently on the pulmonology service.  We are not actively consulted.  Reassurance provided that if pt has cardiology needs, the primary team will consult us  as necessary.  Caller verbalized understanding and was grateful for the call back.  Lonni Meager, NP 11/07/2023, 6:00 PM

## 2023-11-07 NOTE — Evaluation (Signed)
 Physical Therapy Evaluation Patient Details Name: [name] MRN: [id number] DOB: 10-13-1933 Today's Date: 11/07/2023  History of Present Illness  [name]  is an 89 year old male who was admitted with shortness of breath, with O2 sat in the 80s and production of phlegm that is blood-tinged. CXT 8/16 with B opacities/pna.  Chest CT 8/17: Widespread bilateral airspace disease, favor multifocal pneumonia  over edema.  Pt with past medical history significant for atrial flutter, BPH, coronary artery disease, diabetes mellitus, dyslipidemia, myocardial infarction, occlusion and stenosis of the carotid artery, seizure disorder, stroke, stage III melanoma of the back status post neoadjuvant immunotherapy and resection, intermittent complete heart block status post pacemaker placement, paroxysmal atrial fibrillation on Xarelto , gait imbalance, documented dementia and diastolic CHF.  Clinical Impression  Pt admitted with above diagnosis. Pt needed total assist of 2 for most mobility a pt extremely deconditioned. Pt unaware that he had BM and needing total assist to be cleaned. Left pt in chair position. Notified nurse that pt requiring 8LO2 beginning of session and 10L end of session with pt tolerating very little activity.  Pt currently with functional limitations due to the deficits listed below (see PT Problem List). Pt will benefit from acute skilled PT to increase their independence and safety with mobility to allow discharge.           If plan is discharge home, recommend the following: Two people to help with walking and/or transfers;Two people to help with bathing/dressing/bathroom;Assistance with cooking/housework;Assist for transportation;Help with stairs or ramp for entrance (vs. home with 24 hour care and hoyer lift)   Can travel by private vehicle   No    Equipment Recommendations Wheelchair (measurements PT);Wheelchair cushion (measurements PT);Hospital bed  Recommendations for  Other Services       Functional Status Assessment Patient has had a recent decline in their functional status and demonstrates the ability to make significant improvements in function in a reasonable and predictable amount of time.     Precautions / Restrictions Precautions Precautions: Fall Restrictions Weight Bearing Restrictions Per Provider Order: No      Mobility  Bed Mobility Overal bed mobility: Needs Assistance Bed Mobility: Rolling, Sidelying to Sit Rolling: Max assist, Used rails Sidelying to sit: Total assist, +2 for physical assistance       General bed mobility comments: Pt needed max assist to roll but total assist of 2 to come to eOB and could not sit upright with pt leaning to his right.  Pt not using his UEs effectively to sit up. Noted BM and ended up lying pt down and calling NT to assist to clean pt. Pt rolled with max assist to be cleaned.    Transfers                   General transfer comment: unable.    Ambulation/Gait                  Stairs            Wheelchair Mobility     Tilt Bed    Modified Rankin (Stroke Patients Only)       Balance                                             Pertinent Vitals/Pain Pain Assessment Pain Assessment: Faces Faces Pain Scale: Hurts even more Pain  Location: bil LEs Pain Descriptors / Indicators: Discomfort, Grimacing, Guarding Pain Intervention(s): Limited activity within patient's tolerance, Monitored during session, Repositioned    Home Living Family/patient expects to be discharged to:: Private residence Living Arrangements: Other (Comment) (niece lives with pt and daughter close by) Available Help at Discharge: Family;Available 24 hours/day Type of Home: House Home Access: Stairs to enter Entrance Stairs-Rails: Right;Left;Can reach both Entrance Stairs-Number of Steps: 2   Home Layout: One level Home Equipment: Rollator (4 wheels);Shower seat;Grab  bars - toilet;Grab bars - tub/shower;Rolling Walker (2 wheels);BSC/3in1;Cane - quad;Hand held shower head;Lift chair Additional Comments: wife passed 8/16    Prior Function Prior Level of Function : Independent/Modified Independent             Mobility Comments: used rollator Modif I ADLs Comments: Pt cooked and did own B/D, does not drive     Extremity/Trunk Assessment   Upper Extremity Assessment Upper Extremity Assessment: Defer to OT evaluation    Lower Extremity Assessment Lower Extremity Assessment: RLE deficits/detail;LLE deficits/detail RLE Deficits / Details: grossly 3-/5 LLE Deficits / Details: grossly 3-/5    Cervical / Trunk Assessment Cervical / Trunk Assessment: Kyphotic  Communication   Communication Communication: Impaired Factors Affecting Communication: Reduced clarity of speech    Cognition Arousal: Alert Behavior During Therapy: Flat affect   PT - Cognitive impairments: Difficult to assess Difficult to assess due to: Hard of hearing/deaf                       Following commands: Intact       Cueing Cueing Techniques: Verbal cues, Gestural cues, Tactile cues     General Comments General comments (skin integrity, edema, etc.): Pt on 8LO2 on arrival with sats 93%.  Desat to 89% and had to incr to 10L to get sats to 90%. Notified nursing who planeed to watch pt. Other VSS.    Exercises General Exercises - Lower Extremity Ankle Circles/Pumps: AROM, Both, 10 reps, Supine Long Arc Quad: AROM, Both, 10 reps, Seated Hip Flexion/Marching: AROM, Both, 10 reps, Seated   Assessment/Plan    PT Assessment Patient needs continued PT services  PT Problem List Decreased activity tolerance;Decreased balance;Decreased mobility;Decreased knowledge of use of DME;Decreased safety awareness;Decreased knowledge of precautions;Cardiopulmonary status limiting activity       PT Treatment Interventions DME instruction;Gait training;Therapeutic  activities;Functional mobility training;Therapeutic exercise;Balance training;Patient/family education;Wheelchair mobility training    PT Goals (Current goals can be found in the Care Plan section)  Acute Rehab PT Goals Patient Stated Goal: to go home PT Goal Formulation: With patient Time For Goal Achievement: 11/21/23 Potential to Achieve Goals: Fair    Frequency Min 2X/week     Co-evaluation               AM-PAC PT 6 Clicks Mobility  Outcome Measure Help needed turning from your back to your side while in a flat bed without using bedrails?: A Lot Help needed moving from lying on your back to sitting on the side of a flat bed without using bedrails?: Total Help needed moving to and from a bed to a chair (including a wheelchair)?: Total Help needed standing up from a chair using your arms (e.g., wheelchair or bedside chair)?: Total Help needed to walk in hospital room?: Total Help needed climbing 3-5 steps with a railing? : Total 6 Click Score: 7    End of Session Equipment Utilized During Treatment: Gait belt;Oxygen Activity Tolerance: Patient limited by fatigue Patient left:  in bed;with call bell/phone within reach;with family/visitor present Nurse Communication: Mobility status;Need for lift equipment PT Visit Diagnosis: Muscle weakness (generalized) (M62.81)    Time: 8841-8751 PT Time Calculation (min) (ACUTE ONLY): 50 min   Charges:   PT Evaluation $PT Eval Moderate Complexity: 1 Mod PT Treatments $Therapeutic Activity: 8-22 mins $Self Care/Home Management: 8-22 PT General Charges $$ ACUTE PT VISIT: 1 Visit         Marisela Line M,PT Acute Rehab Services [phone]   Stephane JULIANNA Bevel 11/07/2023, 4:35 PM

## 2023-11-07 NOTE — Progress Notes (Signed)
 Patient observed repeatedly coughing after sips of water this morning. Made patient NPO for speech to eval.

## 2023-11-07 NOTE — Progress Notes (Signed)
 NAME:  [name], MRN:  [id number], DOB:  Jul 24, 1933, LOS: 1 ADMISSION DATE:  11/05/2023, CONSULTATION DATE:  11/06/2023 REFERRING MD:  Dr. Debby - [facility], CHIEF COMPLAINT: Mopped assist  History of Present Illness:  [name] is a 89 year old male with a past medical history significant for stage III melanoma of back s/p neoadjunctive immunotherapy and resection, intermittent heart block s/p PPM, paroxysmal atrial fibrillation anticoagulated with Xarelto , HTN, CAD, HFpEF, CVA, HLD, hypothyroidism, and BPH who presented to the ED at [facility] 8/16 for complaints of dyspnea on exertion that began 2 days prior to admission.  Additionally reported coughing up blood tinged sputum and hypoxia observed on home oximetry.  Patient was admitted per hospitalist for management of bilateral community-acquired pneumonia with associated hypoxia and mild hemoptysis.  Morning of 8/17 pulmonary critical care consulted to assist in managing mild hemoptysis.  Pertinent  Medical History  Stage III melanoma of back s/p neoadjunctive immunotherapy and resection, intermittent heart block s/p PPM, paroxysmal atrial fibrillation anticoagulated with Xarelto , HTN, CAD, HFpEF, CVA, HLD, hypothyroidism, and BPH   Significant Hospital Events: Including procedures, antibiotic start and stop dates in addition to other pertinent events   8/16 admitted per hospitalist for bilateral community-acquired pneumonia with hypoxia 8/17 PCCM consulted for assistance in managing hypoxia.  Started on antibiotics for pneumonia and steroids for immunotherapy pneumonitis  Interim History / Subjective:   More awake today.  On 8 L oxygen Hemoptysis is improving.  Had 1 episode of blood-tinged mucus today. Had a swallow eval today showing moderate oropharyngeal dysphagia  Objective    Blood pressure (!) 166/79, pulse 69, temperature 98.2 F (36.8 C), temperature source Oral, resp. rate (!) 22, height 5' 8 (1.727 m), weight 80.7  kg, SpO2 94%.        Intake/Output Summary (Last 24 hours) at 11/07/2023 1218 Last data filed at 11/07/2023 0612 Gross per 24 hour  Intake 350 ml  Output 1500 ml  Net -1150 ml   Filed Weights   11/05/23 2118  Weight: 80.7 kg    Examination: Blood pressure (!) 166/79, pulse 69, temperature 98.2 F (36.8 C), temperature source Oral, resp. rate (!) 22, height 5' 8 (1.727 m), weight 80.7 kg, SpO2 94%. Gen:      No acute distress, weak, chronically ill-appearing HEENT:  EOMI, sclera anicteric Neck:     No masses; no thyromegaly Lungs:    Clear to auscultation bilaterally; normal respiratory effort CV:         Regular rate and rhythm; no murmurs Abd:      + bowel sounds; soft, non-tender; no palpable masses, no distension Ext:    No edema; adequate peripheral perfusion Neuro: Awake, does not answer questions  Labs/imaging reviewed CTA with no PE, bilateral airspace disease, consolidation  Resolved problem list   Assessment and Plan  Acute hypoxic respiratory failure in the setting of bilateral pulmonary infiltrates Mild hemoptysis in the setting of anticoagulation Moderate oropharyngeal dysphagia 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  Has negative procalcitonin and respiratory virus panel P: Continue supplemental oxygen for sat goal greater than 92 Continue empiric ceftriaxone , azithromycin  On Solu-Medrol  40 mg IV every 12 TXA nebs as needed for management of hemoptysis  Apixaban  on hold Aspiration precautions dysphagia diet Elevate head of bed Encourage pulmonary hygiene As needed antitussives Defer bronchoscopy due to high O2 requirements, advanced age and frailty.  Best Practice (right click and Reselect all SmartList Selections daily)  Per primary  Signature:   Caitlynn Ju MD [facility] See Amion for pager  If no response to pager , please call [phone] until 7pm After 7:00  pm call Elink  [phone] 11/07/2023, 12:18 PM

## 2023-11-07 NOTE — Progress Notes (Signed)
 PROGRESS NOTE    [name]  FMW:[id number] DOB: 11/23/1933 DOA: 11/05/2023 PCP: Duanne Butler ONEIDA, MD  Outpatient Specialists:     Brief Narrative:  Patient is an 89 year old male with past medical history significant for atrial flutter, BPH, coronary artery disease, diabetes mellitus, dyslipidemia, myocardial infarction, occlusion and stenosis of the carotid artery, seizure disorder, stroke, stage III melanoma of the back status post neoadjuvant immunotherapy and resection, intermittent complete heart block status post pacemaker placement, paroxysmal atrial fibrillation on Xarelto , gait imbalance, documented dementia and diastolic CHF.  Patient was admitted with shortness of breath, with O2 sat in the 80s and production of phlegm that is blood-tinged.  No fever or chills.  Procalcitonin is less than 0.1.  CTA chest revealed widespread fluffy infiltrate bilaterally.  Leukocytosis noted, but could be reactive (WBC of 10.1 on presentation, but rose to 14.4 today).  Pulmonary input is appreciated.  11/06/2023: Patient seen alongside patient's son.  Patient continues to need supplemental oxygen.  No fever or chills.  Patient is currently on antibiotics.  Pulmonary input is appreciated.  Also communicated with the pulmonary team.  Also communicated with patient's granddaughter over the phone.  Xarelto  is currently on hold.  11/07/2023: Patient seen alongside patient's son and provider.  Pulmonary input is appreciated.  Patient was on pembrolizumab  prior to presentation.  Pneumonitis is also being considered.  Patient is on IV steroids.  Patient is currently on 8 L of supplemental oxygen.  Patient is not interested to proceed with bronchoscopy, as per pulmonary team.  See swallowing evaluation report below.   Assessment & Plan:   Principal Problem:   CAP (community acquired pneumonia) Active Problems:   Acute hypoxic respiratory failure (HCC)   Acute hypoxic respiratory failure: Possible  pneumonia query atypical: Possible pneumonitis: Possible pulmonary hemorrhage:  - Patient was on Xarelto  for atrial fibrillation prior to presentation. - Patient continues to cough up blood tinged sputum. - Xarelto  is on hold. - Procalcitonin is less than 0.1. - WBC on presentation was 10.1, with no left shift.  WBC has trended downwards from 14.4-11.2. -Patient is on 8 L of supplemental oxygen. - Pulmonary input is highly appreciated. - Continue IV Rocephin  and azithromycin . -IV steroids as per pulmonary team. -Negative respiratory panel by PCR. -Negative strep pneumo urinary antigen. -Follow sputum culture result -Urine Legionella antigen is pending.   Hemoptysis - Patient has been coughing up blood-tinged sputum. - See above documentation.    - concern for possible pulmonary hemorrhage versus pneumonitis (patient was on immunotherapy) - CTA chest was negative for pulmonary embolism. - Pulmonary team input is highly appreciated.     Anemia - Likely multifactorial. - Hemoglobin of 10.1 g/dL on presentation, with MCV of 95.2. - Repeat hemoglobin level of 9.2 g/dL this morning. - Continue to monitor H/H. - Low threshold to add iron panel, B12 and folate levels.      Stage III Melanoma -on immunotherapy    Abnormal cardiac enzymes:  Hx of CAD - presumed due to demand  -no chest pain  -resume imdur  as tolerated and atrovastatin - Troponin of 51-59.   Hx of P Afib  - Xarelto  is on hold.   CHF with preserved ejection fraction:  -patient on low dose lasix   - bnp elevated from baseline  - Start torsemide  20 mg p.o. once daily. - Monitor renal function and electrolytes closely. - Monitor blood pressure.     Hypertension, Uncontrolled -resume home regimen with amlodipine ,clonidine ,losartan  - Start torsemide . - Continue  to optimize.    CVA   DMII -iss/fs    Seizure d/o  -continue keppra , carbamazepine    Dementia  -resume aricept    Gait disturbance -PT    Depression Lexapro    Social -wife passed a day prior to presentation.   - social work to follow   DVT prophylaxis: Xarelto  is on hold. Code Status: Full code for now. Family Communication: Son and granddaughter. Disposition Plan: Inpatient.   Consultants:  Pulmonary.  Procedures:  None for now.  Swallow Evaluation Recommendations Recommendations: PO diet PO Diet Recommendation: Regular;Mildly thick liquids (Level 2, nectar thick) Liquid Administration via: Cup;Straw Medication Administration: Whole meds with liquid Supervision: Staff to assist with self-feeding (as needed) Swallowing strategies  : Slow rate;Small bites/sips Postural changes: Position pt fully upright for meals;Stay upright 30-60 min after meals Oral care recommendations: Oral care BID (2x/day)  Antimicrobials:  IV Rocephin . IV azithromycin    Subjective: She remains short of breath.  Objective: Vitals:   11/06/23 2325 11/07/23 0325 11/07/23 0800 11/07/23 1129  BP: (!) 158/77 (!) 140/73 (!) 165/62 (!) 166/79  Pulse: 81 68 68 69  Resp: 20 20 (!) 22 (!) 22  Temp: 98 F (36.7 C) 98.4 F (36.9 C) 98.4 F (36.9 C) 98.2 F (36.8 C)  TempSrc: Oral Oral Oral Oral  SpO2: 94% 95% 93% 94%  Weight:      Height:        Intake/Output Summary (Last 24 hours) at 11/07/2023 1238 Last data filed at 11/07/2023 0612 Gross per 24 hour  Intake 350 ml  Output 1500 ml  Net -1150 ml   Filed Weights   11/05/23 2118  Weight: 80.7 kg    Examination:  General exam: Patient is not in any distress.  Minimal communication from patient. Respiratory system: Decreased air entry. Cardiovascular system: S1 & S2, irregular  Gastrointestinal system: Abdomen is soft and nontender.  Central nervous system: Minimal communication from patient.  Data Reviewed: I have personally reviewed following labs and imaging studies  CBC: Recent Labs  Lab 11/05/23 2357 11/06/23 0603 11/06/23 1149 11/07/23 0828  WBC 10.1  14.4*  --  11.2*  NEUTROABS 6.9  --   --  9.5*  HGB 10.1* 10.7* 9.2* 10.2*  HCT 31.9* 33.1* 28.6* 31.7*  MCV 95.2 94.6  --  94.9  PLT 306 298  --  268   Basic Metabolic Panel: Recent Labs  Lab 11/05/23 2154 11/06/23 0603 11/07/23 0828  NA 138 139 139  K 4.0 3.9 3.6  CL 104 105 102  CO2 23 21* 22  GLUCOSE 195* 169* 171*  BUN 20 15 23   CREATININE 1.04 0.99 1.22  CALCIUM  8.8* 8.7* 8.6*  MG  --   --  1.9  PHOS  --   --  4.2   GFR: Estimated Creatinine Clearance: 40.5 mL/min (by C-G formula based on SCr of 1.22 mg/dL). Liver Function Tests: Recent Labs  Lab 11/05/23 2154 11/06/23 0603 11/07/23 0828  AST 46* 41  --   ALT 30 29  --   ALKPHOS 76 82  --   BILITOT 0.7 1.0  --   PROT 6.8 6.6  --   ALBUMIN 3.0* 2.9* 2.6*   No results for input(s): LIPASE, AMYLASE in the last 168 hours. No results for input(s): AMMONIA in the last 168 hours. Coagulation Profile: No results for input(s): INR, PROTIME in the last 168 hours. Cardiac Enzymes: No results for input(s): CKTOTAL, CKMB, CKMBINDEX, TROPONINI in the last 168 hours. BNP (last  3 results) No results for input(s): PROBNP in the last 8760 hours. HbA1C: No results for input(s): HGBA1C in the last 72 hours. CBG: No results for input(s): GLUCAP in the last 168 hours. Lipid Profile: No results for input(s): CHOL, HDL, LDLCALC, TRIG, CHOLHDL, LDLDIRECT in the last 72 hours. Thyroid  Function Tests: No results for input(s): TSH, T4TOTAL, FREET4, T3FREE, THYROIDAB in the last 72 hours. Anemia Panel: No results for input(s): VITAMINB12, FOLATE, FERRITIN, TIBC, IRON, RETICCTPCT in the last 72 hours. Urine analysis:    Component Value Date/Time   COLORURINE YELLOW 01/05/2022 1801   APPEARANCEUR CLEAR 01/05/2022 1801   LABSPEC 1.011 01/05/2022 1801   PHURINE 6.0 01/05/2022 1801   GLUCOSEU NEGATIVE 01/05/2022 1801   HGBUR NEGATIVE 01/05/2022 1801   BILIRUBINUR NEGATIVE  01/05/2022 1801   KETONESUR NEGATIVE 01/05/2022 1801   PROTEINUR NEGATIVE 01/05/2022 1801   UROBILINOGEN 0.2 10/22/2013 1451   NITRITE NEGATIVE 01/05/2022 1801   LEUKOCYTESUR NEGATIVE 01/05/2022 1801   Sepsis Labs: @LABRCNTIP (procalcitonin:4,lacticidven:4)  ) Recent Results (from the past 240 hours)  Resp panel by RT-PCR (RSV, Flu A&B, Covid) Anterior Nasal Swab     Status: None   Collection Time: 11/05/23  9:54 PM   Specimen: Anterior Nasal Swab  Result Value Ref Range Status   SARS Coronavirus 2 by RT PCR NEGATIVE NEGATIVE Final   Influenza A by PCR NEGATIVE NEGATIVE Final   Influenza B by PCR NEGATIVE NEGATIVE Final    Comment: (NOTE) The Xpert Xpress SARS-CoV-2/FLU/RSV plus assay is intended as an aid in the diagnosis of influenza from Nasopharyngeal swab specimens and should not be used as a sole basis for treatment. Nasal washings and aspirates are unacceptable for Xpert Xpress SARS-CoV-2/FLU/RSV testing.  Fact Sheet for Patients: BloggerCourse.com  Fact Sheet for Healthcare Providers: SeriousBroker.it  This test is not yet approved or cleared by the [place]  FDA and has been authorized for detection and/or diagnosis of SARS-CoV-2 by FDA under an Emergency Use Authorization (EUA). This EUA will remain in effect (meaning this test can be used) for the duration of the COVID-19 declaration under Section 564(b)(1) of the Act, 21 U.S.C. section 360bbb-3(b)(1), unless the authorization is terminated or revoked.     Resp Syncytial Virus by PCR NEGATIVE NEGATIVE Final    Comment: (NOTE) Fact Sheet for Patients: BloggerCourse.com  Fact Sheet for Healthcare Providers: SeriousBroker.it  This test is not yet approved or cleared by the [place]  FDA and has been authorized for detection and/or diagnosis of SARS-CoV-2 by FDA under an Emergency Use Authorization (EUA).  This EUA will remain in effect (meaning this test can be used) for the duration of the COVID-19 declaration under Section 564(b)(1) of the Act, 21 U.S.C. section 360bbb-3(b)(1), unless the authorization is terminated or revoked.  Performed at [facility], [address] [place], [place] [address]   Culture, blood (routine x 2)     Status: None (Preliminary result)   Collection Time: 11/05/23 11:57 PM   Specimen: BLOOD LEFT ARM  Result Value Ref Range Status   Specimen Description BLOOD LEFT ARM  Final   Special Requests   Final    BOTTLES DRAWN AEROBIC AND ANAEROBIC Blood Culture adequate volume   Culture   Final    NO GROWTH 1 DAY Performed at [facility], [address] [place], [place] [address]    Report Status PENDING  Incomplete  Culture, blood (routine x 2)     Status: None (Preliminary result)   Collection  Time: 11/05/23 11:57 PM   Specimen: BLOOD LEFT ARM  Result Value Ref Range Status   Specimen Description BLOOD LEFT ARM  Final   Special Requests   Final    BOTTLES DRAWN AEROBIC AND ANAEROBIC Blood Culture adequate volume   Culture   Final    NO GROWTH 1 DAY Performed at [facility], [address] [place], [place] [address]    Report Status PENDING  Incomplete  Respiratory (~20 pathogens) panel by PCR     Status: None   Collection Time: 11/06/23  7:02 AM   Specimen: Nasopharyngeal Swab; Respiratory  Result Value Ref Range Status   Adenovirus NOT DETECTED NOT DETECTED Final   Coronavirus 229E NOT DETECTED NOT DETECTED Final    Comment: (NOTE) The Coronavirus on the Respiratory Panel, DOES NOT test for the novel  Coronavirus (2019 nCoV)    Coronavirus HKU1 NOT DETECTED NOT DETECTED Final   Coronavirus NL63 NOT DETECTED NOT DETECTED Final   Coronavirus OC43 NOT DETECTED NOT DETECTED Final   Metapneumovirus NOT DETECTED NOT DETECTED Final   Rhinovirus / Enterovirus NOT DETECTED NOT DETECTED Final   Influenza A NOT DETECTED NOT DETECTED  Final   Influenza B NOT DETECTED NOT DETECTED Final   Parainfluenza Virus 1 NOT DETECTED NOT DETECTED Final   Parainfluenza Virus 2 NOT DETECTED NOT DETECTED Final   Parainfluenza Virus 3 NOT DETECTED NOT DETECTED Final   Parainfluenza Virus 4 NOT DETECTED NOT DETECTED Final   Respiratory Syncytial Virus NOT DETECTED NOT DETECTED Final   Bordetella pertussis NOT DETECTED NOT DETECTED Final   Bordetella Parapertussis NOT DETECTED NOT DETECTED Final   Chlamydophila pneumoniae NOT DETECTED NOT DETECTED Final   Mycoplasma pneumoniae NOT DETECTED NOT DETECTED Final    Comment: Performed at [facility], [address], [place], [place] [address]  Expectorated Sputum Assessment w Gram Stain, Rflx to Resp Cult     Status: None   Collection Time: 11/06/23  7:03 AM   Specimen: Expectorated Sputum  Result Value Ref Range Status   Specimen Description EXPECTORATED SPUTUM  Final   Special Requests NONE  Final   Sputum evaluation   Final    THIS SPECIMEN IS ACCEPTABLE FOR SPUTUM CULTURE Performed at [facility], [address], [place], [place] [address]    Report Status 11/06/2023 FINAL  Final  Culture, Respiratory w Gram Stain     Status: None (Preliminary result)   Collection Time: 11/06/23  7:03 AM  Result Value Ref Range Status   Specimen Description EXPECTORATED SPUTUM  Final   Special Requests NONE Reflexed from [id number]  Final   Gram Stain   Final    RARE WBC SEEN RARE GRAM POSITIVE COCCI RARE GRAM NEGATIVE RODS Performed at [facility], [address], [place], [place] [address]    Culture PENDING  Incomplete   Report Status PENDING  Incomplete  MRSA Next Gen by PCR, Nasal     Status: None   Collection Time: 11/06/23 10:50 AM   Specimen: Nasal Mucosa; Nasal Swab  Result Value Ref Range Status   MRSA by PCR Next Gen NOT DETECTED NOT DETECTED Final    Comment: (NOTE) The GeneXpert MRSA Assay (FDA approved for NASAL specimens only), is one component of a  comprehensive MRSA colonization surveillance program. It is not intended to diagnose MRSA infection nor to guide or monitor treatment for MRSA infections. Test performance is not FDA approved in patients less than 2  years old. Performed at [facility], [address] [place], [place] [address]          Radiology Studies: DG Swallowing Func-Speech Pathology Result Date: 11/07/2023 Table formatting from the original result was not included. Images from the original result were not included. Modified Barium Swallow Study Patient Details Name: CAILLOU MINUS MRN: [id number] Date of Birth: 29-Oct-1933 Today's Date: 11/07/2023 HPI/PMH: HPI: Van Seymore  is an 89 year old male who was admitted with shortness of breath, with O2 sat in the 80s and production of phlegm that is blood-tinged. CXT 8/16 with B opacities/pna.  Chest CT 8/17: Widespread bilateral airspace disease, favor multifocal pneumonia  over edema.  Pt with past medical history significant for atrial flutter, BPH, coronary artery disease, diabetes mellitus, dyslipidemia, myocardial infarction, occlusion and stenosis of the carotid artery, seizure disorder, stroke, stage III melanoma of the back status post neoadjuvant immunotherapy and resection, intermittent complete heart block status post pacemaker placement, paroxysmal atrial fibrillation on Xarelto , gait imbalance, documented dementia and diastolic CHF. Clinical Impression: Pt presents with a moderate oropharyngeal dysphagia c/b delayed swallow initiation, reduced hyolaryngeal elevation and excursion, incomplete laryngeal closure, reduced UES opening, and diminished sensation. These deficts resulted in intermittent silent aspiration of thin liquid.  Pt was unable to clear aspiration with cued cough which was very weak and ineffective.  Straw presentation improved oral clearance of liquids as holding was noted prior to large aspiration event, but did not prevent silent  penetration.  There was no penetration or aspiration with nectar or honey thick liquids.  Pt exhibited good oral and pharyngeal clearance of solid and puree textures without any penetration or aspiration.  During esophageal sweep ther ewas retention and backflow of contrast within the esophagus.  There was retention of trace amounts of contrast in cervical esophagus with possible CP bar v osteophyte v cervical hyper extentsion (see image below).  Recommend regular diet with nectar/mildly thick liquids. DIGEST Swallow Severity Rating*  Safety: 2  Efficiency: 1  Overall Pharyngeal Swallow Severity: 2 1: mild; 2: moderate; 3: severe; 4: profound *The Dynamic Imaging Grade of Swallowing Toxicity is standardized for the head and neck cancer population, however, demonstrates promising clinical applications across populations to standardize the clinical rating of pharyngeal swallow safety and severity. Retention of contrast in cervical esophagus 2/2 cp bar v osteophyte v cervical hyperextension Factors that may increase risk of adverse event in presence of aspiration Noe & Lianne 2021): Factors that may increase risk of adverse event in presence of aspiration Noe & Lianne 2021): Reduced cognitive function; Weak cough Recommendations/Plan: Swallowing Evaluation Recommendations Swallowing Evaluation Recommendations Recommendations: PO diet PO Diet Recommendation: Regular; Mildly thick liquids (Level 2, nectar thick) Liquid Administration via: Cup; Straw Medication Administration: Whole meds with liquid Supervision: Staff to assist with self-feeding (as needed) Swallowing strategies  : Slow rate; Small bites/sips Postural changes: Position pt fully upright for meals; Stay upright 30-60 min after meals Oral care recommendations: Oral care BID (2x/day) Treatment Plan Treatment Plan Treatment recommendations: Therapy as outlined in treatment plan below Follow-up recommendations: -- (Continue ST at next level of care)  Functional status assessment: Patient has had a recent decline in their functional status and demonstrates the ability to make significant improvements in function in a reasonable and predictable amount of time. Treatment frequency: Min 2x/week Treatment duration: 2 weeks Interventions: Diet toleration management by SLP; Patient/family education; Trials of upgraded texture/liquids; Respiratory muscle strength training; Oropharyngeal exercises Recommendations Recommendations for follow up therapy are one  component of a multi-disciplinary discharge planning process, led by the attending physician.  Recommendations may be updated based on patient status, additional functional criteria and insurance authorization. Assessment: Orofacial Exam: Orofacial Exam Oral Cavity: Oral Hygiene: WFL Oral Cavity - Dentition: Adequate natural dentition; Missing dentition Orofacial Anatomy: WFL Oral Motor/Sensory Function: -- (see BSE) Anatomy: No data recorded Boluses Administered: Boluses Administered Boluses Administered: Thin liquids (Level 0); Mildly thick liquids (Level 2, nectar thick); Moderately thick liquids (Level 3, honey thick); Puree; Solid  Oral Impairment Domain: Oral Impairment Domain Lip Closure: Escape progressing to mid-chin Tongue control during bolus hold: Posterior escape of less than half of bolus Bolus preparation/mastication: Timely and efficient chewing and mashing Bolus transport/lingual motion: Brisk tongue motion Oral residue: Trace residue lining oral structures Location of oral residue : Tongue Initiation of pharyngeal swallow : Pyriform sinuses  Pharyngeal Impairment Domain: Pharyngeal Impairment Domain Soft palate elevation: No bolus between soft palate (SP)/pharyngeal wall (PW) Laryngeal elevation: Partial superior movement of thyroid  cartilage/partial approximation of arytenoids to epiglottic petiole Anterior hyoid excursion: Partial anterior movement Epiglottic movement: Complete inversion Laryngeal  vestibule closure: Incomplete, narrow column air/contrast in laryngeal vestibule Pharyngeal stripping wave : Present - diminished Pharyngeal contraction (A/P view only): N/A Pharyngoesophageal segment opening: Partial distention/partial duration, partial obstruction of flow Tongue base retraction: No contrast between tongue base and posterior pharyngeal wall (PPW) Pharyngeal residue: Collection of residue within or on pharyngeal structures Location of pharyngeal residue: Valleculae  Esophageal Impairment Domain: Esophageal Impairment Domain Esophageal clearance upright position: Esophageal retention with retrograde flow below pharyngoesophageal segment (PES) Pill: Pill Consistency administered: Thin liquids (Level 0) Penetration/Aspiration Scale Score: Penetration/Aspiration Scale Score 1.  Material does not enter airway: Mildly thick liquids (Level 2, nectar thick); Moderately thick liquids (Level 3, honey thick); Puree; Solid; Pill 8.  Material enters airway, passes BELOW cords without attempt by patient to eject out (silent aspiration) : Thin liquids (Level 0) Compensatory Strategies: Compensatory Strategies Compensatory strategies: Yes Straw: Ineffective Ineffective Straw: Thin liquid (Level 0) Other(comment): Ineffective (cued cough)   General Information: Caregiver present: No  Diet Prior to this Study: Regular; Thin liquids (Level 0)   No data recorded  Respiratory Status: WFL   Supplemental O2: Nasal cannula   History of Recent Intubation: No  Behavior/Cognition: Alert; Cooperative; Pleasant mood Self-Feeding Abilities: Needs assist with self-feeding Baseline vocal quality/speech: Hypophonia/low volume Volitional Cough: Able to elicit No data recorded Exam Limitations: No limitations Goal Planning: Prognosis for improved oropharyngeal function: Fair Barriers to Reach Goals: Cognitive deficits No data recorded Patient/Family Stated Goal: not stated, agreeable to instrumental No data recorded Pain: Pain  Assessment Pain Assessment: Faces Faces Pain Scale: 0 End of Session: Start Time:SLP Start Time (ACUTE ONLY): 1025 Stop Time: SLP Stop Time (ACUTE ONLY): 1038 Time Calculation:SLP Time Calculation (min) (ACUTE ONLY): 13 min Charges: SLP Evaluations $ SLP Speech Visit: 1 Visit SLP Evaluations $BSS Swallow: 1 Procedure $MBS Swallow: 1 Procedure SLP visit diagnosis: SLP Visit Diagnosis: Dysphagia, oropharyngeal phase (R13.12) Past Medical History: Past Medical History: Diagnosis Date  Atrial flutter (HCC)   BPH (benign prostatic hyperplasia)   Coronary artery disease   a. s/p MI tx with Cypher DES to pRCA in 4/04;  b. Echocardiogram 7/09: Normal LV function.  c. Nuclear study 3/13 no ischemia;  d. ETT 2/14 neg;  e. admx with CP => LHC (01/30/2013):  pLAD 40-50%, oD1 70-80%, oOM1 40%, pRCA stent patent, mid RCA 30%, EF 60-65%. => med Rx.  DDD (degenerative disc disease)  Diabetes mellitus without complication (HCC)   Dyslipidemia   Gait disorder 07/13/2018  Hemorrhoids   Hx MRSA infection   left buttocks abscess  Hx of echocardiogram   a. Echocardiogram (01/31/2013): Mild focal basal and mild concentric hypertrophy of the septum, EF 50-55%, normal wall motion, grade 1 diastolic dysfunction, trivial AI, MAC, mild LAE, PASP 35  Hyperlipidemia   Hypertension   Hypothyroidism   Meniere's disease   Status post shunt  Myocardial infarction [facility]) 2008  Occlusion and stenosis of carotid artery without mention of cerebral infarction   40-59% on carotid doppler 2014; US  (01/2013): R 1-39%, L 60-79%  Rotator cuff injury   chronic rotator cuff injury status post repair  Seizure disorder (HCC)   Stroke (HCC)   a. 01/2013=> post cardiac cath CVA to L post communicating artery system; R sided weakness  Syncope  Past Surgical History: Past Surgical History: Procedure Laterality Date  CARDIOVERSION N/A 05/26/2022  Procedure: CARDIOVERSION;  Surgeon: Hobart Powell BRAVO, MD;  Location: [facility] ENDOSCOPY;  Service: Cardiovascular;  Laterality:  N/A;  COLONOSCOPY    CORONARY ANGIOPLASTY WITH STENT PLACEMENT  07/14/2002  Stent to the right coronary artery  ENDOLYMPHATIC SHUNT DECOMPRESSION  06/11/2009   Right endolymphatic sac decompression and shunt  placement  HERNIA REPAIR  04/10/2008  scrotal hernia repair  LEFT HEART CATHETERIZATION WITH CORONARY ANGIOGRAM N/A 01/30/2013  Procedure: LEFT HEART CATHETERIZATION WITH CORONARY ANGIOGRAM;  Surgeon: Ezra GORMAN Shuck, MD;  Location: [facility] CATH LAB;  Service: Cardiovascular;  Laterality: N/A;  PACEMAKER IMPLANT N/A 08/29/2020  Procedure: PACEMAKER IMPLANT;  Surgeon: Waddell Danelle ORN, MD;  Location: [facility] INVASIVE CV LAB;  Service: Cardiovascular;  Laterality: N/A;  ROTATOR CUFF REPAIR    for chronic rotator cuff injury  TEE WITHOUT CARDIOVERSION N/A 05/26/2022  Procedure: TRANSESOPHAGEAL ECHOCARDIOGRAM (TEE);  Surgeon: Hobart Powell BRAVO, MD;  Location: [facility] ENDOSCOPY;  Service: Cardiovascular;  Laterality: N/A; Anette BRAVO Grippe, MA, CCC-SLP Acute Rehabilitation Services Office: [phone] 11/07/2023, 11:11 AM  CT Angio Chest Pulmonary Embolism (PE) W or WO Contrast Result Date: 11/06/2023 CLINICAL DATA:  Pulmonary embolism (PE) suspected, high prob, cough, abnormal chest x-ray EXAM: CT ANGIOGRAPHY CHEST WITH CONTRAST TECHNIQUE: Multidetector CT imaging of the chest was performed using the standard protocol during bolus administration of intravenous contrast. Multiplanar CT image reconstructions and MIPs were obtained to evaluate the vascular anatomy. RADIATION DOSE REDUCTION: This exam was performed according to the departmental dose-optimization program which includes automated exposure control, adjustment of the mA and/or kV according to patient size and/or use of iterative reconstruction technique. CONTRAST:  75mL OMNIPAQUE  IOHEXOL  350 MG/ML SOLN COMPARISON:  07/02/2011, 11/05/2023 FINDINGS: Cardiovascular: This is a technically adequate evaluation of the pulmonary vasculature. No filling defects or pulmonary emboli.  Dilated left atrium without overall cardiomegaly. Dense calcifications of the mitral and aortic valves. Dual lead cardiac pacer, proximal lead in the right atrium and distal lead in the right ventricle. Stenosis of the left brachiocephalic vein likely due to indwelling pacer, with numerous venous collaterals throughout the chest wall. No evidence of thoracic aortic aneurysm or dissection. Atherosclerosis of the aorta and coronary vasculature. Mediastinum/Nodes: No enlarged mediastinal, hilar, or axillary lymph nodes. Thyroid  gland, trachea, and esophagus demonstrate no significant findings. Lungs/Pleura: Widespread bilateral airspace disease again identified, most pronounced within the right upper lobe, favor multifocal infection over edema. There is small free-flowing bilateral pleural effusions. No pneumothorax. The central airways are patent. Upper Abdomen: No acute abnormality. Musculoskeletal: No acute or destructive bony abnormalities. Reconstructed images demonstrate no  additional findings. Review of the MIP images confirms the above findings. IMPRESSION: 1. No evidence of pulmonary embolus. 2. Widespread bilateral airspace disease, favor multifocal pneumonia over edema. 3. Small free-flowing bilateral pleural effusions. 4. Aortic Atherosclerosis (ICD10-I70.0). Coronary artery atherosclerosis. Electronically Signed   By: Ozell Daring M.D.   On: 11/06/2023 11:14   DG Chest 2 View Result Date: 11/05/2023 CLINICAL DATA:  Cough EXAM: CHEST - 2 VIEW COMPARISON:  01/05/2022 FINDINGS: Left-sided pacing device as before. Low lung volumes. Stable cardiomediastinal silhouette. Chronic elevation right diaphragm. Patchy bilateral airspace opacities, favor bilateral pneumonia. No pleural effusion or pneumothorax. IMPRESSION: Patchy bilateral airspace opacities, favor bilateral pneumonia. Radiographic follow-up to resolution is recommended. Electronically Signed   By: Luke Bun M.D.   On: 11/05/2023 23:00         Scheduled Meds:  amLODipine   10 mg Oral Daily   atorvastatin   80 mg Oral Daily   carbamazepine   300 mg Oral BID   cloNIDine   0.2 mg Oral BID   donepezil   5 mg Oral QHS   escitalopram   5 mg Oral QHS   isosorbide  mononitrate  30 mg Oral Daily   levETIRAcetam   500 mg Oral BID   levothyroxine   100 mcg Oral Daily   losartan   50 mg Oral Daily   methylPREDNISolone  (SOLU-MEDROL ) injection  40 mg Intravenous Q12H   mirabegron  ER  50 mg Oral Daily   multivitamin with minerals  1 tablet Oral QHS   tamsulosin   0.4 mg Oral QPC supper   torsemide   20 mg Oral Daily   Continuous Infusions:  azithromycin  Stopped (11/06/23 2226)   cefTRIAXone  (ROCEPHIN )  IV 2 g (11/07/23 1150)     LOS: 1 day    Time spent: 55 minutes.    Leatrice Chapel, MD  [facility] Pager #: [phone] 7PM-7AM contact night coverage as above

## 2023-11-08 ENCOUNTER — Encounter (HOSPITAL_COMMUNITY): Payer: Self-pay | Admitting: Internal Medicine

## 2023-11-08 ENCOUNTER — Inpatient Hospital Stay (HOSPITAL_COMMUNITY)

## 2023-11-08 DIAGNOSIS — M7989 Other specified soft tissue disorders: Secondary | ICD-10-CM | POA: Diagnosis not present

## 2023-11-08 DIAGNOSIS — J9601 Acute respiratory failure with hypoxia: Secondary | ICD-10-CM | POA: Diagnosis not present

## 2023-11-08 DIAGNOSIS — I63423 Cerebral infarction due to embolism of bilateral anterior cerebral arteries: Secondary | ICD-10-CM

## 2023-11-08 DIAGNOSIS — R29715 NIHSS score 15: Secondary | ICD-10-CM

## 2023-11-08 DIAGNOSIS — T45516A Underdosing of anticoagulants, initial encounter: Secondary | ICD-10-CM | POA: Diagnosis not present

## 2023-11-08 DIAGNOSIS — J984 Other disorders of lung: Secondary | ICD-10-CM | POA: Diagnosis not present

## 2023-11-08 DIAGNOSIS — J189 Pneumonia, unspecified organism: Secondary | ICD-10-CM | POA: Diagnosis not present

## 2023-11-08 DIAGNOSIS — R042 Hemoptysis: Secondary | ICD-10-CM | POA: Diagnosis not present

## 2023-11-08 LAB — COMPREHENSIVE METABOLIC PANEL WITH GFR
ALT: 144 U/L — ABNORMAL HIGH (ref 0–44)
AST: 157 U/L — ABNORMAL HIGH (ref 15–41)
Albumin: 2.5 g/dL — ABNORMAL LOW (ref 3.5–5.0)
Alkaline Phosphatase: 79 U/L (ref 38–126)
Anion gap: 14 (ref 5–15)
BUN: 40 mg/dL — ABNORMAL HIGH (ref 8–23)
CO2: 24 mmol/L (ref 22–32)
Calcium: 8.7 mg/dL — ABNORMAL LOW (ref 8.9–10.3)
Chloride: 103 mmol/L (ref 98–111)
Creatinine, Ser: 1.39 mg/dL — ABNORMAL HIGH (ref 0.61–1.24)
GFR, Estimated: 49 mL/min — ABNORMAL LOW (ref >60)
Glucose, Bld: 298 mg/dL — ABNORMAL HIGH (ref 70–99)
Potassium: 3.4 mmol/L — ABNORMAL LOW (ref 3.5–5.1)
Sodium: 141 mmol/L (ref 135–145)
Total Bilirubin: 0.3 mg/dL (ref 0.0–1.2)
Total Protein: 6 g/dL — ABNORMAL LOW (ref 6.5–8.1)

## 2023-11-08 LAB — CULTURE, RESPIRATORY W GRAM STAIN: Culture: NORMAL

## 2023-11-08 LAB — LEGIONELLA PNEUMOPHILA SEROGP 1 UR AG: L. pneumophila Serogp 1 Ur Ag: NEGATIVE

## 2023-11-08 LAB — CBC
HCT: 30.4 % — ABNORMAL LOW (ref 39.0–52.0)
Hemoglobin: 9.5 g/dL — ABNORMAL LOW (ref 13.0–17.0)
MCH: 30.2 pg (ref 26.0–34.0)
MCHC: 31.3 g/dL (ref 30.0–36.0)
MCV: 96.5 fL (ref 80.0–100.0)
Platelets: 303 K/uL (ref 150–400)
RBC: 3.15 MIL/uL — ABNORMAL LOW (ref 4.22–5.81)
RDW: 15.7 % — ABNORMAL HIGH (ref 11.5–15.5)
WBC: 16.6 K/uL — ABNORMAL HIGH (ref 4.0–10.5)
nRBC: 0.2 % (ref 0.0–0.2)

## 2023-11-08 LAB — GLUCOSE, CAPILLARY: Glucose-Capillary: 219 mg/dL — ABNORMAL HIGH (ref 70–99)

## 2023-11-08 LAB — ANA W/REFLEX IF POSITIVE: Anti Nuclear Antibody (ANA): NEGATIVE

## 2023-11-08 MED ORDER — DEXTROSE-SODIUM CHLORIDE 5-0.9 % IV SOLN: INTRAVENOUS | Status: DC

## 2023-11-08 MED ORDER — STROKE: EARLY STAGES OF RECOVERY BOOK
Freq: Once | Status: AC
  Filled 2023-11-08: qty 1

## 2023-11-08 MED ORDER — IOHEXOL 350 MG/ML SOLN
135.0000 mL | Freq: Once | INTRAVENOUS | Status: AC | PRN
  Administered 2023-11-08: 135 mL via INTRAVENOUS

## 2023-11-08 NOTE — Progress Notes (Signed)
 PROGRESS NOTE [name]  FMW:[id number] DOB: 22-Apr-1933 DOA: 11/05/2023 PCP: Duanne Butler ONEIDA, MD  Brief Narrative/Hospital Course: 89 year old male with past medical history significant for atrial flutter, BPH, coronary artery disease, diabetes mellitus, dyslipidemia, myocardial infarction, occlusion and stenosis of the carotid artery, seizure disorder, stroke, stage III melanoma of the back status post neoadjuvant immunotherapy and resection, intermittent complete heart block status post pacemaker placement, paroxysmal atrial fibrillation on Xarelto , gait imbalance, documented dementia and diastolic CHF admitted with shortness of breath, with hypoxia in 80s,  w/ CTA chest>>widespread fluffy infiltrate bilaterally and admitted 8/16, pulmonary consulted 8/17 with ongoing hypoxia.  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 Continue aspiration precautions elevate head continue pulmonary hygiene antitussives and supportive care.  Continue supplemental oxygen Having ongoing and worsening hypoxia.  Palliative care has been consulted.  Anemia: Likely multifactorial monitor hemoglobin-currently stable at 9-11 gm  HTN Chronic diastolic CHF: BP well-controlled.  Continue home amlodipine  10, clonidine  0.2  twice daily, Imdur  30, losartan  50 and torsemide  20 Net IO Since Admission: -1,690 mL [11/08/23 1351]   PAF History of cardioembolic cerebrovascular accident w/ HLD w/ mild rt sided wekness History of seizure Memory issues/depression- grieving: Eliquis  on hold.  Continue home Tegretol , Keppra ,statins and  Aricept , Lexapro .  Keep on fall precaution delirium precaution. Wife passed day prior to presentation continue supportive care LUE weakness but hs global weakness, te well. Ct head 8/18 ok- onset ? 2 days or longer. Will obtain MRI brain to evaluate further   CAD S/P percutaneous coronary angioplasty Dyslipidemia Abnormal cardiac enzymes-likely demand ischemia from hypoxia: Anticoagulation on hold continue Imdur , statin  Left upper extremity swelling: Follow-up duplex ordered  Hypothyroidism: Continue home Synthroid .  Malignant melanoma  On immunotherapy currently on hold.  Diabetes mellitus: Last blood sugar 171, add CBG check   Recent Labs  Lab 11/08/23 0952  GLUCAP 219*     Mobility: PT Orders: Active PT Follow up Rec: Skilled Nursing-Short Term Rehab (<3 Hours/Day)11/07/2023 1600   DVT prophylaxis:  Code Status:   Code Status: Full Code Family Communication: plan of care discussed with patient/patient's daughters and son at bedside. Patient status is: Remains hospitalized because of severity of illness Level of care: Progressive   Dispo: The patient is from: home            Anticipated disposition: Skilled nursing facility once stable   Objective: Vitals last 24 hrs: Vitals:   11/08/23 0926 11/08/23 0935 11/08/23 1123 11/08/23 1145  BP: (!) 178/79 (!) 178/79 121/60   Pulse: 66  77   Resp: 20  (!) 24   Temp:   98 F (36.7 C)   TempSrc:   Axillary   SpO2: 92%  91% 93%  Weight:      Height:        Physical Examination: General exam: alert awake, appears depressed, not in distress HEENT:Oral mucosa moist, Ear/Nose WNL grossly Respiratory system:  Bilaterally clear BS,no use of accessory  muscle Cardiovascular system: S1 & S2 +, No JVD. Gastrointestinal system: Abdomen soft,NT,ND, BS+ Nervous System: Alert, awake, global weakness, able to wiggle his toes,more weakness on LUE Extremities: LE edema neg, distal extremities warm.  Skin: No rashes,no icterus. MSK: Normal muscle bulk,tone, power.   Medications reviewed:  Scheduled Meds:  amLODipine   10 mg Oral Daily   atorvastatin   80 mg Oral Daily   carbamazepine   300 mg Oral BID   cloNIDine   0.2 mg Oral BID   donepezil   5 mg Oral QHS   escitalopram   5 mg Oral QHS   ipratropium-albuterol   3 mL Nebulization Q4H   isosorbide  mononitrate  30 mg Oral Daily   levETIRAcetam   500 mg Oral BID   levothyroxine   100 mcg Oral Daily   losartan   50 mg Oral Daily   methylPREDNISolone  (SOLU-MEDROL ) injection  40 mg Intravenous Q12H   mirabegron  ER  50 mg Oral Daily   multivitamin with minerals  1 tablet Oral QHS   tamsulosin   0.4 mg Oral QPC supper   torsemide   20 mg Oral Daily   Continuous Infusions:  azithromycin  500 mg (11/07/23 2328)   cefTRIAXone  (ROCEPHIN )  IV 2 g (11/08/23 1156)   Diet: Diet Order             Diet regular Fluid consistency: Nectar Thick  Diet effective now                    Data Reviewed: I have personally reviewed following labs and imaging studies ( see epic result tab) CBC: Recent Labs  Lab 11/05/23 2357 11/06/23 0603 11/06/23 1149 11/07/23 0828  WBC 10.1 14.4*  --  11.2*  NEUTROABS 6.9  --   --  9.5*  HGB 10.1* 10.7* 9.2* 10.2*  HCT 31.9* 33.1* 28.6* 31.7*  MCV 95.2 94.6  --  94.9  PLT 306 298  --  268   CMP: Recent Labs  Lab 11/05/23 2154 11/06/23 0603 11/07/23 0828  NA 138 139 139  K 4.0 3.9 3.6  CL 104 105 102  CO2 23 21* 22  GLUCOSE 195* 169* 171*  BUN 20 15 23   CREATININE 1.04 0.99 1.22  CALCIUM  8.8* 8.7* 8.6*  MG  --   --  1.9  PHOS  --   --  4.2   GFR: Estimated Creatinine Clearance: 40.5 mL/min (by C-G formula based on  SCr of 1.22 mg/dL). Recent Labs  Lab 11/05/23 2154 11/06/23 0603 11/07/23 0828  AST 46* 41  --   ALT 30 29  --   ALKPHOS 76 82  --   BILITOT 0.7 1.0  --   PROT 6.8 6.6  --   ALBUMIN 3.0* 2.9* 2.6*   No results for input(s): LIPASE, AMYLASE in the last 168 hours. No results for input(s): AMMONIA in the last 168 hours. Coagulation Profile: No results for input(s): INR, PROTIME in the last 168 hours. Unresulted Labs (From admission, onward)     Start     Ordered   11/06/23 0716  Hemoglobin and hematocrit, blood  Now then every 6 hours,   R (with TIMED occurrences)      11/06/23 0715   11/06/23 9297  Lactic acid, plasma  (Lactic Acid)  STAT Now then every 3 hours,   R (with STAT occurrences)      11/06/23 0701   11/06/23 0701  Legionella Pneumophila Serogp 1 Ur Ag  Once,   R        11/06/23  0701   11/05/23 2129  CBC with Differential  Once,   STAT        11/05/23 2129           Antimicrobials/Microbiology: Anti-infectives (From admission, onward)    Start     Dose/Rate Route Frequency Ordered Stop   11/06/23 2300  azithromycin  (ZITHROMAX ) 500 mg in sodium chloride  0.9 % 250 mL IVPB        500 mg 250 mL/hr over 60 Minutes Intravenous Every 24 hours 11/06/23 0424 11/11/23 2259   11/06/23 1200  cefTRIAXone  (ROCEPHIN ) 2 g in sodium chloride  0.9 % 100 mL IVPB        2 g 200 mL/hr over 30 Minutes Intravenous Every 24 hours 11/06/23 0424 11/11/23 1159   11/05/23 2330  cefTRIAXone  (ROCEPHIN ) 1 g in sodium chloride  0.9 % 100 mL IVPB        1 g 200 mL/hr over 30 Minutes Intravenous  Once 11/05/23 2318 11/06/23 0019   11/05/23 2330  azithromycin  (ZITHROMAX ) 500 mg in sodium chloride  0.9 % 250 mL IVPB        500 mg 250 mL/hr over 60 Minutes Intravenous  Once 11/05/23 2318 11/06/23 0119         Component Value Date/Time   SDES EXPECTORATED SPUTUM 11/06/2023 0703   SDES EXPECTORATED SPUTUM 11/06/2023 0703   SPECREQUEST NONE 11/06/2023 0703   SPECREQUEST NONE Reflexed  from [id number] 11/06/2023 0703   CULT  11/06/2023 0703    MODERATE Normal respiratory flora-no Staph aureus or Pseudomonas seen Performed at [facility], [address] [place], [place] [address]    REPTSTATUS 11/06/2023 FINAL 11/06/2023 0703   REPTSTATUS 11/08/2023 FINAL 11/06/2023 0703    Procedures:    Mennie LAMY, MD [facility] 11/08/2023, 1:54 PM

## 2023-11-08 NOTE — Hospital Course (Addendum)
 89 year old male with past medical history significant for atrial flutter, BPH, coronary artery disease, diabetes mellitus, dyslipidemia, myocardial infarction, occlusion and stenosis of the carotid artery, seizure disorder, stroke, stage III melanoma of the back status post neoadjuvant immunotherapy and resection, intermittent complete heart block status post pacemaker placement, paroxysmal atrial fibrillation on Xarelto , gait imbalance, documented dementia and diastolic CHF admitted with shortness of breath, with hypoxia in 80s,  w/ CTA chest>>widespread fluffy infiltrate bilaterally and admitted 8/16, pulmonary consulted 8/17 with ongoing hypoxia.  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 Continue aspiration precautions elevate head continue pulmonary hygiene antitussives and supportive care.  Continue supplemental oxygen Having ongoing and worsening hypoxia.  Palliative care has been consulted.  Anemia: Likely multifactorial monitor hemoglobin-currently stable at 9-11 gm  HTN Chronic diastolic CHF: BP well-controlled.  Continue home amlodipine  10, clonidine  0.2 twice daily, Imdur  30, losartan  50 and torsemide  20 Net IO Since Admission: -1,690 mL [11/08/23 1351]   PAF History of cardioembolic  cerebrovascular accident w/ HLD w/ mild rt sided wekness History of seizure Memory issues/depression- grieving: Eliquis  on hold.  Continue home Tegretol , Keppra ,statins and  Aricept , Lexapro .  Keep on fall precaution delirium precaution. Wife passed day prior to presentation continue supportive care LUE weakness but hs global weakness, te well. Ct head 8/18 ok- onset ? 2 days or longer. Will obtain MRI brain to evaluate further   CAD S/P percutaneous coronary angioplasty Dyslipidemia Abnormal cardiac enzymes-likely demand ischemia from hypoxia: Anticoagulation on hold continue Imdur , statin  Left upper extremity swelling: Follow-up duplex ordered  Hypothyroidism: Continue home Synthroid .  Malignant melanoma  On immunotherapy currently on hold.  Diabetes mellitus: Last blood sugar 171, add CBG check   Recent Labs  Lab 11/08/23 0952  GLUCAP 219*     Mobility: PT Orders: Active PT Follow up Rec: Skilled Nursing-Short Term Rehab (<3 Hours/Day)11/07/2023 1600   DVT prophylaxis:  Code Status:   Code Status: Full Code Family Communication: plan of care discussed with patient/patient's daughters and son at bedside. Patient status is: Remains hospitalized because of severity of illness Level of care: Progressive   Dispo: The patient is from: home            Anticipated disposition: Skilled nursing facility once stable   Objective: Vitals last 24 hrs: Vitals:   11/08/23 0926 11/08/23 0935 11/08/23 1123 11/08/23 1145  BP: (!) 178/79 (!) 178/79 121/60   Pulse: 66  77   Resp: 20  (!) 24   Temp:   98 F (36.7 C)   TempSrc:   Axillary   SpO2: 92%  91% 93%  Weight:      Height:        Physical Examination: General exam: alert awake, appears depressed, not in distress HEENT:Oral mucosa moist, Ear/Nose WNL grossly Respiratory system: Bilaterally clear BS,no use of accessory muscle Cardiovascular system: S1 & S2 +, No JVD. Gastrointestinal system: Abdomen soft,NT,ND,  BS+ Nervous System: Alert, awake, global weakness,  able to wiggle his toes,more weakness on LUE Extremities: LE edema neg, distal extremities warm.  Skin: No rashes,no icterus. MSK: Normal muscle bulk,tone, power.   Medications reviewed:  Scheduled Meds:  amLODipine   10 mg Oral Daily   atorvastatin   80 mg Oral Daily   carbamazepine   300 mg Oral BID   cloNIDine   0.2 mg Oral BID   donepezil   5 mg Oral QHS   escitalopram   5 mg Oral QHS   ipratropium-albuterol   3 mL Nebulization Q4H   isosorbide  mononitrate  30 mg Oral Daily   levETIRAcetam   500 mg Oral BID   levothyroxine   100 mcg Oral Daily   losartan   50 mg Oral Daily   methylPREDNISolone  (SOLU-MEDROL ) injection  40 mg Intravenous Q12H   mirabegron  ER  50 mg Oral Daily   multivitamin with minerals  1 tablet Oral QHS   tamsulosin   0.4 mg Oral QPC supper   torsemide   20 mg Oral Daily   Continuous Infusions:  azithromycin  500 mg (11/07/23 2328)   cefTRIAXone  (ROCEPHIN )  IV 2 g (11/08/23 1156)   Diet: Diet Order             Diet regular Fluid consistency: Nectar Thick  Diet effective now

## 2023-11-08 NOTE — CV Procedure (Signed)
  Device system confirmed to be MRI conditional, with implant date > 6 weeks ago, and no evidence of abandoned or epicardial leads in review of most recent CXR  Device last cleared by EP Provider: URSUY on 11/08/23  Clearance is good through for 1 year as long as parameters remain stable at time of check. If pt undergoes a cardiac device procedure during that time, they should be re-cleared.   Tachy-therapies to be programmed off if applicable with device back to pre-MRI settings after completion of exam.  [employer] - Programming recommendation received through [employer] App/Tablet  Tobias LITTIE Leander, RT  11/08/2023 2:30 PM

## 2023-11-08 NOTE — Plan of Care (Signed)
   Problem: Education: Goal: Knowledge of General Education information will improve Description: Including pain rating scale, medication(s)/side effects and non-pharmacologic comfort measures Outcome: Not Progressing   Problem: Health Behavior/Discharge Planning: Goal: Ability to manage health-related needs will improve Outcome: Not Progressing   Problem: Clinical Measurements: Goal: Ability to maintain clinical measurements within normal limits will improve Outcome: Not Progressing Goal: Will remain free from infection Outcome: Not Progressing Goal: Diagnostic test results will improve Outcome: Not Progressing Goal: Respiratory complications will improve Outcome: Not Progressing Goal: Cardiovascular complication will be avoided Outcome: Not Progressing   Problem: Activity: Goal: Risk for activity intolerance will decrease Outcome: Not Progressing   Problem: Nutrition: Goal: Adequate nutrition will be maintained Outcome: Not Progressing   Problem: Coping: Goal: Level of anxiety will decrease Outcome: Not Progressing   Problem: Elimination: Goal: Will not experience complications related to bowel motility Outcome: Not Progressing Goal: Will not experience complications related to urinary retention Outcome: Not Progressing   Problem: Pain Managment: Goal: General experience of comfort will improve and/or be controlled Outcome: Not Progressing   Problem: Safety: Goal: Ability to remain free from injury will improve Outcome: Not Progressing   Problem: Skin Integrity: Goal: Risk for impaired skin integrity will decrease Outcome: Not Progressing   Problem: Activity: Goal: Ability to tolerate increased activity will improve Outcome: Not Progressing   Problem: Clinical Measurements: Goal: Ability to maintain a body temperature in the normal range will improve Outcome: Not Progressing   Problem: Respiratory: Goal: Ability to maintain adequate ventilation will  improve Outcome: Not Progressing Goal: Ability to maintain a clear airway will improve Outcome: Not Progressing

## 2023-11-08 NOTE — Progress Notes (Deleted)
 Patient arrived in the unit, CCMD notified, CHG bath given, V/S obtained, B/L groins level 0, denies pain, all needs met, call bell in reach.    11/08/23 1532  Vitals  Temp 97.7 F (36.5 C)  Temp Source Oral  BP (!) 148/69  MAP (mmHg) 92  BP Location Left Arm  BP Method Automatic  Patient Position (if appropriate) Lying  Pulse Rate 82  Pulse Rate Source Monitor  ECG Heart Rate 81  Resp 15  Level of Consciousness  Level of Consciousness Alert  MEWS COLOR  MEWS Score Color Green  Oxygen Therapy  SpO2 96 %  O2 Device Nasal Cannula  O2 Flow Rate (L/min) 2 L/min  Pain Assessment  Pain Scale 0-10  Pain Score 0  MEWS Score  MEWS Temp 0  MEWS Systolic 0  MEWS Pulse 0  MEWS RR 0  MEWS LOC 0  MEWS Score 0

## 2023-11-08 NOTE — Plan of Care (Signed)
  Problem: Clinical Measurements: Goal: Will remain free from infection Outcome: Progressing   Problem: Clinical Measurements: Goal: Diagnostic test results will improve Outcome: Progressing   Problem: Clinical Measurements: Goal: Respiratory complications will improve Outcome: Progressing   Problem: Nutrition: Goal: Adequate nutrition will be maintained Outcome: Progressing   

## 2023-11-08 NOTE — Progress Notes (Addendum)
 NAME:  [name], MRN:  [id number], DOB:  12/20/1933, LOS: 2 ADMISSION DATE:  11/05/2023, CONSULTATION DATE:  11/06/2023 REFERRING MD:  Dr. Debby - [facility], CHIEF COMPLAINT: Mopped assist  History of Present Illness:  [name] is a 89 year old male with a past medical history significant for stage III melanoma of back s/p neoadjunctive immunotherapy and resection, intermittent heart block s/p PPM, paroxysmal atrial fibrillation anticoagulated with Xarelto , HTN, CAD, HFpEF, CVA, HLD, hypothyroidism, and BPH who presented to the ED at [facility] 8/16 for complaints of dyspnea on exertion that began 2 days prior to admission.  Additionally reported coughing up blood tinged sputum and hypoxia observed on home oximetry.  Patient was admitted per hospitalist for management of bilateral community-acquired pneumonia with associated hypoxia and mild hemoptysis.  Morning of 8/17 pulmonary critical care consulted to assist in managing mild hemoptysis.  Pertinent  Medical History  Stage III melanoma of back s/p neoadjunctive immunotherapy and resection, intermittent heart block s/p PPM, paroxysmal atrial fibrillation anticoagulated with Xarelto , HTN, CAD, HFpEF, CVA, HLD, hypothyroidism, and BPH   Significant Hospital Events: Including procedures, antibiotic start and stop dates in addition to other pertinent events   8/16 admitted per hospitalist for bilateral community-acquired pneumonia with hypoxia 8/17 PCCM consulted for assistance in managing hypoxia.  Started on antibiotics for pneumonia and steroids for immunotherapy pneumonitis 8/18: Swallow evaluation shows moderate oropharyngeal dysphagia  Interim History / Subjective:   O2 requirements down to 6 L.  More awake, tearful about his wife's death recently. Got CT head last night for left arm weakness  Objective    Blood pressure (!) 134/51, pulse 71, temperature (!) 97.4 F (36.3 C), temperature source Axillary, resp. rate 19, height 5'  8 (1.727 m), weight 80.7 kg, SpO2 97%.       No intake or output data in the 24 hours ending 11/08/23 0909  Filed Weights   11/05/23 2118  Weight: 80.7 kg    Examination: Blood pressure (!) 134/51, pulse 71, temperature (!) 97.4 F (36.3 C), temperature source Axillary, resp. rate 19, height 5' 8 (1.727 m), weight 80.7 kg, SpO2 97%. Gen:      No acute distress HEENT:  EOMI, sclera anicteric Neck:     No masses; no thyromegaly Lungs:    Clear to auscultation bilaterally; normal respiratory effort CV:         Regular rate and rhythm; no murmurs Abd:      + bowel sounds; soft, non-tender; no palpable masses, no distension Ext:    Left arm swelling Neuro: alert and oriented x 3 Psych: normal mood and affect   CT head with no acute intracranial abnormality.  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  Has negative procalcitonin and respiratory virus panel P: Continue supplemental oxygen for sat goal greater than 92 Continue empiric ceftriaxone , azithromycin  On Solu-Medrol  40 mg IV every 12 TXA nebs as needed for management of hemoptysis  Apixaban  on hold Aspiration precautions dysphagia diet Elevate head of bed Encourage pulmonary hygiene As needed antitussives Defer bronchoscopy due to high O2 requirements, advanced age and frailty.  Left arm swelling Get ultrasound of arm for DVT  Goals of care Told family that he wants to go home.  He is grieving for his wife who dies  from advanced Alzheimer's earlier this week.  Will get palliative care involved.  Best Practice (right click and Reselect all SmartList Selections daily)   Per primary  Signature:   Damel Querry MD [facility] See  Amion for pager  If no response to pager , please call [phone] until 7pm After 7:00 pm call Elink  [phone] 11/08/2023, 9:09 AM

## 2023-11-08 NOTE — Progress Notes (Signed)
 Left upper extremity venous  has been completed. Refer to [facility] under chart review to view preliminary results.   11/08/2023  12:47 PM [name], Ricka BIRCH

## 2023-11-08 NOTE — Progress Notes (Signed)
 PT Cancellation Note  Patient Details Name: [name] MRN: [id number] DOB: 06/16/1933   Cancelled Treatment:    Reason Eval/Treat Not Completed: (P) Medical issues which prohibited therapy (per chart review, not medically appropriate due to new MRI results, will need PT re-evaluation once medically appropriate.) Will defer to supervising PT for follow-up.   [name] [name] 11/08/2023, 3:45 PM

## 2023-11-08 NOTE — NC FL2 (Signed)
 [place]  MEDICAID FL2 LEVEL OF CARE FORM     IDENTIFICATION  Patient Name: [name] Birthdate: 06/10/1933 Sex: male Admission Date (Current Location): 11/05/2023  [facility] and [place] Number:  Producer, television/film/video and Address:  [facility] [place], [address], [place], [place] [address]      Provider Number: [id number]  Attending Physician Name and Address:  Christobal Guadalajara, MD  Relative Name and Phone Number:       Current Level of Care: Hospital Recommended Level of Care: Skilled Nursing Facility Prior Approval Number:    Date Approved/Denied:   PASRR Number: [id number]  Discharge Plan: Home    Current Diagnoses: Patient Active Problem List   Diagnosis Date Noted   Pneumonitis 11/07/2023   CAP (community acquired pneumonia) 11/06/2023   Acute hypoxic respiratory failure (HCC) 11/06/2023   Malignant melanoma (HCC) 05/10/2023   Recurrent major depressive disorder, in remission (HCC) 05/10/2023   Hypercoagulable state due to paroxysmal atrial fibrillation (HCC) 05/10/2022   Heart block AV complete (HCC) 05/10/2022   Pacemaker 05/10/2022   Acute bacterial rhinosinusitis 02/16/2022   Right sided numbness 09/27/2021   Osteoarthritis of right knee 09/04/2021   Pain in joint of right knee 12/01/2020   Bradycardia 07/29/2020   Second degree AV block, Mobitz type II    Syncope 07/08/2020   Acute midline low back pain without sciatica    Lumbar disc disease with radiculopathy 06/30/2020   Weakness of both lower extremities 06/30/2020   Chronic diastolic HF (heart failure) (HCC) 01/16/2020   Acute exacerbation of CHF (congestive heart failure) (HCC) 08/19/2018   Hypertensive urgency 08/19/2018   Acute exacerbation of congestive heart failure (HCC) 08/19/2018   Gait disorder 07/13/2018   Recurrent syncope 07/13/2018   Symptom of syncope 07/13/2018   E. coli UTI 06/19/2018   Leucocytosis 06/19/2018   Normal pressure hydrocephalus (HCC) 06/01/2018    Altered mental status 05/31/2018   AMS (altered mental status) 05/30/2018   Seizure disorder (HCC) 05/30/2018   Fracture of proximal phalanx of finger fifth 06/17/2017 07/21/2017   Upper airway cough syndrome 11/01/2016   Paroxysmal atrial flutter (HCC) 08/05/2016   Coronary artery disease involving native coronary artery of native heart without angina pectoris 08/05/2016   Chronic anticoagulation 12/31/2015   History of seizures 12/31/2015   Abnormal clinical finding 12/31/2015   Acute diastolic CHF (congestive heart failure) (HCC) 12/24/2015   Pulmonary edema    Paroxysmal atrial fibrillation (HCC)    Hypothyroidism    Controlled type 2 diabetes mellitus with complication, without long-term current use of insulin  (HCC)    Hypertensive emergency 12/20/2015   Special screening for malignant neoplasms, colon 12/07/2013   Bowel habit changes 12/07/2013   Alteration in bowel elimination: incontinence 12/07/2013   Diarrhea 03/09/2013   History of cardioembolic cerebrovascular accident (CVA) 01/31/2013   Chest pain 01/29/2013   Bilateral carotid artery disease (HCC)    Loss of coordination 12/03/2012   Morbid obesity due to excess calories (HCC) 08/20/2011   CAD S/P percutaneous coronary angioplasty    Dyslipidemia    HTN (hypertension)     Orientation RESPIRATION BLADDER Height & Weight     Self, Time, Situation, Place  O2 Incontinent Weight: 178 lb (80.7 kg) Height:  5' 8 (172.7 cm)  BEHAVIORAL SYMPTOMS/MOOD NEUROLOGICAL BOWEL NUTRITION STATUS      Continent Diet (please see discharge summary)  AMBULATORY STATUS COMMUNICATION OF NEEDS Skin   Extensive Assist Verbally Normal  Personal Care Assistance Level of Assistance  Bathing, Feeding, Dressing Bathing Assistance: Maximum assistance Feeding assistance: Independent Dressing Assistance: Maximum assistance     Functional Limitations Info  Sight, Hearing, Speech Sight Info: Adequate Hearing  Info: Adequate Speech Info: Adequate    SPECIAL CARE FACTORS FREQUENCY  PT (By licensed PT), OT (By licensed OT)     PT Frequency: 5x per week OT Frequency: 5x per week            Contractures Contractures Info: Not present    Additional Factors Info  Code Status, Allergies Code Status Info: FULL Allergies Info: Penicillins           Current Medications (11/08/2023):  This is the current hospital active medication list Current Facility-Administered Medications  Medication Dose Route Frequency Provider Last Rate Last Admin   acetaminophen  (TYLENOL ) tablet 325-650 mg  325-650 mg Oral Q4H PRN Thomas, Sara-Maiz A, MD       amLODipine  (NORVASC ) tablet 10 mg  10 mg Oral Daily Thomas, Sara-Maiz A, MD   10 mg at 11/08/23 0935   atorvastatin  (LIPITOR ) tablet 80 mg  80 mg Oral Daily Thomas, Sara-Maiz A, MD   80 mg at 11/08/23 0935   azithromycin  (ZITHROMAX ) 500 mg in sodium chloride  0.9 % 250 mL IVPB  500 mg Intravenous Q24H Debby Hitch A, MD 250 mL/hr at 11/07/23 2328 500 mg at 11/07/23 2328   carbamazepine  (TEGRETOL  XR) 12 hr tablet 300 mg  300 mg Oral BID Thomas, Sara-Maiz A, MD   300 mg at 11/08/23 0934   cefTRIAXone  (ROCEPHIN ) 2 g in sodium chloride  0.9 % 100 mL IVPB  2 g Intravenous Q24H Debby Hitch A, MD 200 mL/hr at 11/08/23 1156 2 g at 11/08/23 1156   cloNIDine  (CATAPRES ) tablet 0.2 mg  0.2 mg Oral BID Thomas, Sara-Maiz A, MD   0.2 mg at 11/08/23 0935   donepezil  (ARICEPT ) tablet 5 mg  5 mg Oral QHS Thomas, Sara-Maiz A, MD   5 mg at 11/07/23 2323   escitalopram  (LEXAPRO ) tablet 5 mg  5 mg Oral QHS Thomas, Sara-Maiz A, MD   5 mg at 11/07/23 2324   guaiFENesin -dextromethorphan  (ROBITUSSIN DM) 100-10 MG/5ML syrup 5 mL  5 mL Oral Q4H PRN Segars, Jonathan, MD   5 mL at 11/08/23 0947   hydrALAZINE  (APRESOLINE ) injection 10 mg  10 mg Intravenous Q6H PRN Debby Hitch LABOR, MD       ipratropium-albuterol  (DUONEB) 0.5-2.5 (3) MG/3ML nebulizer solution 3 mL  3 mL Nebulization  Q2H PRN Debby Hitch LABOR, MD       ipratropium-albuterol  (DUONEB) 0.5-2.5 (3) MG/3ML nebulizer solution 3 mL  3 mL Nebulization Q4H Rosario Eland I, MD   3 mL at 11/08/23 1511   isosorbide  mononitrate (IMDUR ) 24 hr tablet 30 mg  30 mg Oral Daily Thomas, Sara-Maiz A, MD   30 mg at 11/08/23 0934   levETIRAcetam  (KEPPRA ) tablet 500 mg  500 mg Oral BID Thomas, Sara-Maiz A, MD   500 mg at 11/08/23 0950   levothyroxine  (SYNTHROID ) tablet 100 mcg  100 mcg Oral Daily Thomas, Sara-Maiz A, MD   100 mcg at 11/08/23 9370   losartan  (COZAAR ) tablet 50 mg  50 mg Oral Daily Thomas, Sara-Maiz A, MD   50 mg at 11/08/23 0935   methylPREDNISolone  sodium succinate (SOLU-MEDROL ) 40 mg/mL injection 40 mg  40 mg Intravenous Q12H Mannam, Praveen, MD   40 mg at 11/08/23 9371   mirabegron  ER (MYRBETRIQ ) tablet 50 mg  50 mg  Oral Daily Debby Camila LABOR, MD   50 mg at 11/08/23 0935   multivitamin with minerals tablet 1 tablet  1 tablet Oral QHS Debby Camila A, MD   1 tablet at 11/07/23 2323   tamsulosin  (FLOMAX ) capsule 0.4 mg  0.4 mg Oral QPC supper Debby Camila A, MD   0.4 mg at 11/07/23 1728   torsemide  (DEMADEX ) tablet 20 mg  20 mg Oral Daily Ogbata, Sylvester I, MD   20 mg at 11/08/23 0934   tranexamic acid  (CYKLOKAPRON ) 1000 MG/10ML nebulizer solution 500 mg  500 mg Nebulization PRN Harris, Whitney D, NP         Discharge Medications: Please see discharge summary for a list of discharge medications.  Relevant Imaging Results:  Relevant Lab Results:   Additional Information SSN [id number]  Montie LOISE Louder, LCSW

## 2023-11-08 NOTE — Progress Notes (Signed)
 Radiology called with a stat report on patients MRI, Attending provider aware of MRI results, Bedside RN also aware. Kelie Gainey Jessup RN

## 2023-11-08 NOTE — Significant Event (Addendum)
 MRI result reviewed - acute strokes b/l on frontal and parietal lobes-see reprot-Neurology Dr Michaela contacted. Family updated- added acute stroke order ct including ct angio, head/neck  w/ and wo, NPO. Patient on 8l [place], alert awake- not able to move his legs except wiggling toes and weak grip on left arm, having coughing spells. Antihypertensives stopped to allow permissive hypertension. Add gentle ivf while npo. Will await neuro clearance on starting anticoagulation- given size of stroke- he has has age indeterminate dvt on the left arm. Encouraged family to d/w re CODE status-They  state patient would not want intubation but they would like to wait on neurology first before discussing code status. Palliative consult pending.

## 2023-11-08 NOTE — Consult Note (Addendum)
 NEUROLOGY CONSULT NOTE   Date of service: November 08, 2023 Patient Name: [name] MRN:  [id number] DOB:  1933-05-13 Chief Complaint: Weakness Requesting Provider: Christobal Guadalajara, MD  History of Present Illness  [name] is a 89 y.o. male with hx of atrial fibrillation who presented on 8/16 with hemoptysis.  Due to the hemoptysis, his Xarelto  was held and then subsequently after admission it was noted that he was having generalized weakness.  As workup of this, an MRI was performed which demonstrates significant bilateral strokes  LKW: 8/16 IV Thrombolysis: No, active hemoptysis, recent anticoagulation EVT: No, no signs of LVO  NIHSS components Score: Comment  1a Level of Conscious 0[x]  1[]  2[]  3[]      1b LOC Questions 0[]  1[x]  2[]       1c LOC Commands 0[x]  1[]  2[]       2 Best Gaze 0[]  1[x]  2[]       3 Visual 0[x]  1[]  2[]  3[]      4 Facial Palsy 0[x]  1[]  2[]  3[]      5a Motor Arm - left 0[]  1[]  2[]  3[x]  4[]  UN[]    5b Motor Arm - Right 0[]  1[]  2[x]  3[]  4[]  UN[]    6a Motor Leg - Left 0[]  1[]  2[]  3[x]  4[]  UN[]    6b Motor Leg - Right 0[]  1[]  2[]  3[x]  4[]  UN[]    7 Limb Ataxia 0[x]  1[]  2[]  UN[]      8 Sensory 0[x]  1[]  2[]  UN[]      9 Best Language 0[]  1[x]  2[]  3[]      10 Dysarthria 0[x]  1[]  2[]  UN[]      11 Extinct. and Inattention 0[x]  1[]  2[]       TOTAL:        Past History   Past Medical History:  Diagnosis Date   Atrial flutter (HCC)    BPH (benign prostatic hyperplasia)    Coronary artery disease    a. s/p MI tx with Cypher DES to pRCA in 4/04;  b. Echocardiogram 7/09: Normal LV function.  c. Nuclear study 3/13 no ischemia;  d. ETT 2/14 neg;  e. admx with CP => LHC (01/30/2013):  pLAD 40-50%, oD1 70-80%, oOM1 40%, pRCA stent patent, mid RCA 30%, EF 60-65%. => med Rx.   DDD (degenerative disc disease)    Diabetes mellitus without complication (HCC)    Dyslipidemia    Gait disorder 07/13/2018   Hemorrhoids    Hx MRSA infection    left buttocks abscess   Hx of  echocardiogram    a. Echocardiogram (01/31/2013): Mild focal basal and mild concentric hypertrophy of the septum, EF 50-55%, normal wall motion, grade 1 diastolic dysfunction, trivial AI, MAC, mild LAE, PASP 35   Hyperlipidemia    Hypertension    Hypothyroidism    Meniere's disease    Status post shunt   Myocardial infarction [facility]) 2008   Occlusion and stenosis of carotid artery without mention of cerebral infarction    40-59% on carotid doppler 2014; US  (01/2013): R 1-39%, L 60-79%   Rotator cuff injury    chronic rotator cuff injury status post repair   Seizure disorder (HCC)    Stroke (HCC)    a. 01/2013=> post cardiac cath CVA to L post communicating artery system; R sided weakness   Syncope     Past Surgical History:  Procedure Laterality Date   CARDIOVERSION N/A 05/26/2022   Procedure: CARDIOVERSION;  Surgeon: Hobart Powell BRAVO, MD;  Location: [facility] ENDOSCOPY;  Service: Cardiovascular;  Laterality: N/A;   COLONOSCOPY  CORONARY ANGIOPLASTY WITH STENT PLACEMENT  07/14/2002   Stent to the right coronary artery   ENDOLYMPHATIC SHUNT DECOMPRESSION  06/11/2009    Right endolymphatic sac decompression and shunt  placement   HERNIA REPAIR  04/10/2008   scrotal hernia repair   LEFT HEART CATHETERIZATION WITH CORONARY ANGIOGRAM N/A 01/30/2013   Procedure: LEFT HEART CATHETERIZATION WITH CORONARY ANGIOGRAM;  Surgeon: Ezra GORMAN Shuck, MD;  Location: [facility] CATH LAB;  Service: Cardiovascular;  Laterality: N/A;   PACEMAKER IMPLANT N/A 08/29/2020   Procedure: PACEMAKER IMPLANT;  Surgeon: Waddell Danelle ORN, MD;  Location: [facility] INVASIVE CV LAB;  Service: Cardiovascular;  Laterality: N/A;   ROTATOR CUFF REPAIR     for chronic rotator cuff injury   TEE WITHOUT CARDIOVERSION N/A 05/26/2022   Procedure: TRANSESOPHAGEAL ECHOCARDIOGRAM (TEE);  Surgeon: Hobart Powell BRAVO, MD;  Location: [facility] ENDOSCOPY;  Service: Cardiovascular;  Laterality: N/A;    Family History: Family History  Problem Relation  Age of Onset   Heart attack Father 36   Aneurysm Mother        brain aneurysm   Coronary artery disease Brother        with CABG   Diabetes Brother    Colon cancer Neg Hx    Colon polyps Neg Hx     Social History  reports that he quit smoking about 67 years ago. His smoking use included cigarettes. He started smoking about 72 years ago. He has quit using smokeless tobacco.  His smokeless tobacco use included chew. He reports that he does not drink alcohol and does not use drugs.  Allergies  Allergen Reactions   Penicillins     Did it involve swelling of the face/tongue/throat, SOB, or low BP? Unknown Did it involve sudden or severe rash/hives, skin peeling, or any reaction on the inside of your mouth or nose? Unknown Did you need to seek medical attention at a hospital or doctor's office? No When did it last happen?      Decades Ago If all above answers are NO, may proceed with cephalosporin use.      Medications   Current Facility-Administered Medications:    [START ON 11/09/2023]  stroke: early stages of recovery book, , Does not apply, Once, Kc, Ramesh, MD   acetaminophen  (TYLENOL ) tablet 325-650 mg, 325-650 mg, Oral, Q4H PRN, Debby Camila LABOR, MD   atorvastatin  (LIPITOR ) tablet 80 mg, 80 mg, Oral, Daily, Debby Camila A, MD, 80 mg at 11/08/23 0935   azithromycin  (ZITHROMAX ) 500 mg in sodium chloride  0.9 % 250 mL IVPB, 500 mg, Intravenous, Q24H, Debby Camila A, MD, Last Rate: 250 mL/hr at 11/07/23 2328, 500 mg at 11/07/23 2328   carbamazepine  (TEGRETOL  XR) 12 hr tablet 300 mg, 300 mg, Oral, BID, Debby Camila A, MD, 300 mg at 11/08/23 0934   cefTRIAXone  (ROCEPHIN ) 2 g in sodium chloride  0.9 % 100 mL IVPB, 2 g, Intravenous, Q24H, Debby Camila A, MD, Last Rate: 200 mL/hr at 11/08/23 1156, 2 g at 11/08/23 1156   dextrose  5 %-0.9 % sodium chloride  infusion, , Intravenous, Continuous, Kc, Ramesh, MD, Last Rate: 30 mL/hr at 11/08/23 1754, New Bag at 11/08/23  1754   donepezil  (ARICEPT ) tablet 5 mg, 5 mg, Oral, QHS, Debby Camila A, MD, 5 mg at 11/07/23 2323   escitalopram  (LEXAPRO ) tablet 5 mg, 5 mg, Oral, QHS, Debby Camila A, MD, 5 mg at 11/07/23 2324   guaiFENesin -dextromethorphan  (ROBITUSSIN DM) 100-10 MG/5ML syrup 5 mL, 5 mL, Oral, Q4H PRN, Segars, Jonathan, MD, 5  mL at 11/08/23 0947   hydrALAZINE  (APRESOLINE ) injection 10 mg, 10 mg, Intravenous, Q6H PRN, Debby Camila LABOR, MD   ipratropium-albuterol  (DUONEB) 0.5-2.5 (3) MG/3ML nebulizer solution 3 mL, 3 mL, Nebulization, Q2H PRN, Debby Camila A, MD   ipratropium-albuterol  (DUONEB) 0.5-2.5 (3) MG/3ML nebulizer solution 3 mL, 3 mL, Nebulization, Q4H, Ogbata, Sylvester I, MD, 3 mL at 11/08/23 1511   isosorbide  mononitrate (IMDUR ) 24 hr tablet 30 mg, 30 mg, Oral, Daily, Debby Camila A, MD, 30 mg at 11/08/23 9065   levETIRAcetam  (KEPPRA ) tablet 500 mg, 500 mg, Oral, BID, Debby Camila A, MD, 500 mg at 11/08/23 0950   levothyroxine  (SYNTHROID ) tablet 100 mcg, 100 mcg, Oral, Daily, Debby Camila A, MD, 100 mcg at 11/08/23 9370   methylPREDNISolone  sodium succinate (SOLU-MEDROL ) 40 mg/mL injection 40 mg, 40 mg, Intravenous, Q12H, Mannam, Praveen, MD, 40 mg at 11/08/23 1753   mirabegron  ER (MYRBETRIQ ) tablet 50 mg, 50 mg, Oral, Daily, Debby Camila A, MD, 50 mg at 11/08/23 0935   multivitamin with minerals tablet 1 tablet, 1 tablet, Oral, QHS, Debby Camila A, MD, 1 tablet at 11/07/23 2323   tamsulosin  (FLOMAX ) capsule 0.4 mg, 0.4 mg, Oral, QPC supper, Debby Camila A, MD, 0.4 mg at 11/07/23 1728   tranexamic acid  (CYKLOKAPRON ) 1000 MG/10ML nebulizer solution 500 mg, 500 mg, Nebulization, PRN, Arloa Folks D, NP  Vitals   Vitals:   11/08/23 1145 11/08/23 1512 11/08/23 1608 11/08/23 1756  BP:   (!) 151/56   Pulse:   85 85  Resp:   (!) 25 18  Temp:   (!) 97.4 F (36.3 C)   TempSrc:   Axillary   SpO2: 93% 96% 96% 96%  Weight:      Height:        Body mass  index is 27.06 kg/m.   Physical Exam   Constitutional: Appears well-developed and well-nourished.   Neurologic Examination    Neuro: Mental Status: Patient is awake, alert, he is able to tell me his age but not what month it is He does have hesitant speech, and I wonder if he has a mild expressive aphasia Cranial Nerves: II: Visual Fields are full. Pupils are equal, round, and reactive to light.   III,IV, VI: He has a right gaze preference, but is able to cross midline to the left VII: Facial movement is symmetric.  Motor: He has severe left arm weakness, flinches slightly to noxious stimulation, flinches slightly to noxious stimulation in bilateral lower extremities, moves his right arm better but does have 4 -/5 weakness of that arm as well  sensory: Sensation is symmetric to light touch and temperature in the arms and legs. Cerebellar: Does not perform       Labs/Imaging/Neurodiagnostic studies   CBC:  Recent Labs  Lab November 22, 2023 2357 11/06/23 0603 11/07/23 0828 11/08/23 1848  WBC 10.1   < > 11.2* 16.6*  NEUTROABS 6.9  --  9.5*  --   HGB 10.1*   < > 10.2* 9.5*  HCT 31.9*   < > 31.7* 30.4*  MCV 95.2   < > 94.9 96.5  PLT 306   < > 268 303   < > = values in this interval not displayed.   Basic Metabolic Panel:  Lab Results  Component Value Date   NA 141 11/08/2023   K 3.4 (L) 11/08/2023   CO2 24 11/08/2023   GLUCOSE 298 (H) 11/08/2023   BUN 40 (H) 11/08/2023   CREATININE 1.39 (H) 11/08/2023   CALCIUM   8.7 (L) 11/08/2023   GFRNONAA 49 (L) 11/08/2023   GFRAA 76 07/15/2020   Lipid Panel:  Lab Results  Component Value Date   LDLCALC 107 (H) 07/26/2023   HgbA1c:  Lab Results  Component Value Date   HGBA1C 6.3 (H) 07/26/2023   Urine Drug Screen: No results found for: LABOPIA, COCAINSCRNUR, LABBENZ, AMPHETMU, THCU, LABBARB  Alcohol Level No results found for: [facility] INR  Lab Results  Component Value Date   INR 1.0 08/29/2020   APTT  Lab  Results  Component Value Date   APTT 29 09/18/2019   AED levels:  Lab Results  Component Value Date   LEVETIRACETA 25.1 01/28/2022     MRI Brain(Personally reviewed): Bilateral moderate-sized ACA strokes  ASSESSMENT   [name] is a 89 y.o. male with a history of melanoma who presents with hemoptysis in the setting of pneumonitis requiring holding his anticoagulation.  In this setting he apparently has had bilateral embolic strokes. My suspicion is for an embolic stroke in the setting of holding anticoagulation.  I had a long discussion with the patient's daughters, my suspicion is that he has a fairly poor prognosis and will live in a nursing home at best if he survives this.  I had conversations regarding that I felt that if he were to have cardiac arrest, his likelihood of a good outcome would be very low and certainly it would not make him better than his already debilitated state.  In this setting, his daughters who are POA agreed with each other that he would not want life support in that setting.  I did raise the subject of the possibility that keeping him comfortable may end up being at odds with prolong his life, and further limits on the aggressiveness of his care may need to be considered at some point.  They will think about this.  RECOMMENDATIONS  CTA head neck A1c, lipids PT, OT, ST Consider palliative care consultation Stroke team to follow ______________________________________________________________________    Signed, Aisha Seals, MD [facility]

## 2023-11-08 NOTE — Progress Notes (Signed)
 CPT held at this time pt sleeping, comfortable. No distress. Vitals stable.

## 2023-11-08 NOTE — TOC Initial Note (Signed)
 Transition of Care [facility]) - Initial/Assessment Note    Patient Details  Name: [name] MRN: [id number] Date of Birth: [birth year]  [facility]) CM/SW Contact:    Montie LOISE Louder, LCSW Phone Number: 11/08/2023, 4:32 PM  Clinical Narrative:                  CSW spoke with patient's daughter, Luke. CSW introduced self and explained role. CSW discussed with family recommendation for short term rehab at [facility].  Luke reports family works and will not be able to assist patient in the home. He will need rehab. CSW explained the SNF process. CSW was given permission to send out SNF referrals.   TOC will provide bed offers once available TOC will continue to follow and assist with discharge planning.   Montie Louder, MSW, LCSW Clinical Social Worker    Expected Discharge Plan: Skilled Nursing Facility Barriers to Discharge: Continued Medical Work up   Patient Goals and CMS Choice            Expected Discharge Plan and Services In-house Referral: Clinical Social Work                                            Prior Living Arrangements/Services   Lives with:: Self          Need for Family Participation in Patient Care: Yes (Comment) Care giver support system in place?: Yes (comment)   Criminal Activity/Legal Involvement Pertinent to Current Situation/Hospitalization: No - Comment as needed  Activities of Daily Living      Permission Sought/Granted                  Emotional Assessment       Orientation: : Oriented to Self, Oriented to Place, Oriented to  Time, Oriented to Situation Alcohol / Substance Use: Not Applicable Psych Involvement: No (comment)  Admission diagnosis:  CAP (community acquired pneumonia) [J18.9] Community acquired pneumonia, unspecified laterality [J18.9] Patient Active Problem List   Diagnosis Date Noted   Pneumonitis 11/07/2023   CAP (community acquired pneumonia) 11/06/2023   Acute hypoxic respiratory  failure (HCC) 11/06/2023   Malignant melanoma (HCC) 05/10/2023   Recurrent major depressive disorder, in remission (HCC) 05/10/2023   Hypercoagulable state due to paroxysmal atrial fibrillation (HCC) 05/10/2022   Heart block AV complete (HCC) 05/10/2022   Pacemaker 05/10/2022   Acute bacterial rhinosinusitis 02/16/2022   Right sided numbness 09/27/2021   Osteoarthritis of right knee 09/04/2021   Pain in joint of right knee 12/01/2020   Bradycardia 07/29/2020   Second degree AV block, Mobitz type II    Syncope 07/08/2020   Acute midline low back pain without sciatica    Lumbar disc disease with radiculopathy 06/30/2020   Weakness of both lower extremities 06/30/2020   Chronic diastolic HF (heart failure) (HCC) 01/16/2020   Acute exacerbation of CHF (congestive heart failure) (HCC) 08/19/2018   Hypertensive urgency 08/19/2018   Acute exacerbation of congestive heart failure (HCC) 08/19/2018   Gait disorder 07/13/2018   Recurrent syncope 07/13/2018   Symptom of syncope 07/13/2018   E. coli UTI 06/19/2018   Leucocytosis 06/19/2018   Normal pressure hydrocephalus (HCC) 06/01/2018   Altered mental status 05/31/2018   AMS (altered mental status) 05/30/2018   Seizure disorder (HCC) 05/30/2018   Fracture of proximal phalanx of finger fifth 06/17/2017 07/21/2017   Upper  airway cough syndrome 11/01/2016   Paroxysmal atrial flutter (HCC) 08/05/2016   Coronary artery disease involving native coronary artery of native heart without angina pectoris 08/05/2016   Chronic anticoagulation 12/31/2015   History of seizures 12/31/2015   Abnormal clinical finding 12/31/2015   Acute diastolic CHF (congestive heart failure) (HCC) 12/24/2015   Pulmonary edema    Paroxysmal atrial fibrillation (HCC)    Hypothyroidism    Controlled type 2 diabetes mellitus with complication, without long-term current use of insulin  (HCC)    Hypertensive emergency 12/20/2015   Special screening for malignant neoplasms,  colon 12/07/2013   Bowel habit changes 12/07/2013   Alteration in bowel elimination: incontinence 12/07/2013   Diarrhea 03/09/2013   History of cardioembolic cerebrovascular accident (CVA) 01/31/2013   Chest pain 01/29/2013   Bilateral carotid artery disease (HCC)    Loss of coordination 12/03/2012   Morbid obesity due to excess calories (HCC) 08/20/2011   CAD S/P percutaneous coronary angioplasty    Dyslipidemia    HTN (hypertension)    PCP:  Duanne Butler DASEN, MD Pharmacy:   [facility] [phone] - [place], [place] - [address] AT [address] [place] [address] Phone: [phone] Fax: [phone]  [facility] [phone] - [place], [place] - [address] AT [facility] [address] [place] [address] Phone: [phone] Fax: [phone]     Social Drivers of Health (SDOH) Social History: SDOH Screenings   Food Insecurity: No Food Insecurity (03/12/2022)  Housing: Low Risk  (03/12/2022)  Transportation Needs: No Transportation Needs (03/12/2022)  Utilities: Not At Risk (03/12/2022)  Alcohol Screen: Low Risk  (03/12/2022)  Depression (PHQ2-9): Low Risk  (03/29/2022)  Financial Resource Strain: High Risk (03/12/2022)  Physical Activity: Inactive (03/12/2022)  Social Connections: Moderately Integrated (03/12/2022)  Stress: No Stress Concern Present (03/12/2022)  Tobacco Use: Medium Risk (11/05/2023)   SDOH Interventions:     Readmission Risk Interventions     No data to display

## 2023-11-09 ENCOUNTER — Inpatient Hospital Stay (HOSPITAL_COMMUNITY)

## 2023-11-09 DIAGNOSIS — R7989 Other specified abnormal findings of blood chemistry: Secondary | ICD-10-CM

## 2023-11-09 DIAGNOSIS — I69391 Dysphagia following cerebral infarction: Secondary | ICD-10-CM

## 2023-11-09 DIAGNOSIS — R29721 NIHSS score 21: Secondary | ICD-10-CM

## 2023-11-09 DIAGNOSIS — I63423 Cerebral infarction due to embolism of bilateral anterior cerebral arteries: Secondary | ICD-10-CM | POA: Diagnosis not present

## 2023-11-09 DIAGNOSIS — Z515 Encounter for palliative care: Secondary | ICD-10-CM | POA: Diagnosis not present

## 2023-11-09 DIAGNOSIS — Z8673 Personal history of transient ischemic attack (TIA), and cerebral infarction without residual deficits: Secondary | ICD-10-CM | POA: Diagnosis not present

## 2023-11-09 DIAGNOSIS — E1151 Type 2 diabetes mellitus with diabetic peripheral angiopathy without gangrene: Secondary | ICD-10-CM | POA: Diagnosis not present

## 2023-11-09 DIAGNOSIS — J9601 Acute respiratory failure with hypoxia: Secondary | ICD-10-CM | POA: Diagnosis not present

## 2023-11-09 DIAGNOSIS — T45516A Underdosing of anticoagulants, initial encounter: Secondary | ICD-10-CM | POA: Diagnosis not present

## 2023-11-09 DIAGNOSIS — G40909 Epilepsy, unspecified, not intractable, without status epilepticus: Secondary | ICD-10-CM

## 2023-11-09 DIAGNOSIS — J984 Other disorders of lung: Secondary | ICD-10-CM | POA: Diagnosis not present

## 2023-11-09 DIAGNOSIS — R042 Hemoptysis: Secondary | ICD-10-CM | POA: Diagnosis not present

## 2023-11-09 DIAGNOSIS — E785 Hyperlipidemia, unspecified: Secondary | ICD-10-CM

## 2023-11-09 LAB — BASIC METABOLIC PANEL WITH GFR
Anion gap: 12 (ref 5–15)
BUN: 33 mg/dL — ABNORMAL HIGH (ref 8–23)
CO2: 26 mmol/L (ref 22–32)
Calcium: 8.8 mg/dL — ABNORMAL LOW (ref 8.9–10.3)
Chloride: 103 mmol/L (ref 98–111)
Creatinine, Ser: 1.12 mg/dL (ref 0.61–1.24)
GFR, Estimated: >60 mL/min (ref >60)
Glucose, Bld: 242 mg/dL — ABNORMAL HIGH (ref 70–99)
Potassium: 3.4 mmol/L — ABNORMAL LOW (ref 3.5–5.1)
Sodium: 141 mmol/L (ref 135–145)

## 2023-11-09 LAB — LIPID PANEL
Cholesterol: 186 mg/dL (ref 0–200)
HDL: 54 mg/dL (ref >40)
LDL Cholesterol: 119 mg/dL — ABNORMAL HIGH (ref 0–99)
Total CHOL/HDL Ratio: 3.4 ratio
Triglycerides: 63 mg/dL (ref <150)
VLDL: 13 mg/dL (ref 0–40)

## 2023-11-09 LAB — CBC
HCT: 31.2 % — ABNORMAL LOW (ref 39.0–52.0)
Hemoglobin: 10.2 g/dL — ABNORMAL LOW (ref 13.0–17.0)
MCH: 30.5 pg (ref 26.0–34.0)
MCHC: 32.7 g/dL (ref 30.0–36.0)
MCV: 93.4 fL (ref 80.0–100.0)
Platelets: 305 K/uL (ref 150–400)
RBC: 3.34 MIL/uL — ABNORMAL LOW (ref 4.22–5.81)
RDW: 15.5 % (ref 11.5–15.5)
WBC: 16.3 K/uL — ABNORMAL HIGH (ref 4.0–10.5)
nRBC: 0.2 % (ref 0.0–0.2)

## 2023-11-09 LAB — HEMOGLOBIN A1C
Hgb A1c MFr Bld: 6.4 % — ABNORMAL HIGH (ref 4.8–5.6)
Mean Plasma Glucose: 136.98 mg/dL

## 2023-11-09 LAB — GLUCOSE, CAPILLARY: Glucose-Capillary: 256 mg/dL — ABNORMAL HIGH (ref 70–99)

## 2023-11-09 MED ORDER — MORPHINE 100MG IN NS 100ML (1MG/ML) PREMIX INFUSION
1.0000 mg/h | INTRAVENOUS | Status: DC
  Administered 2023-11-09: 1 mg/h via INTRAVENOUS
  Administered 2023-11-10: 4 mg/h via INTRAVENOUS
  Filled 2023-11-09 (×3): qty 100

## 2023-11-09 MED ORDER — MORPHINE SULFATE (PF) 2 MG/ML IV SOLN
2.0000 mg | INTRAVENOUS | Status: DC | PRN
  Administered 2023-11-09 (×3): 2 mg via INTRAVENOUS
  Filled 2023-11-09 (×3): qty 1

## 2023-11-09 MED ORDER — LORAZEPAM 2 MG/ML IJ SOLN
1.0000 mg | INTRAMUSCULAR | Status: DC | PRN
  Administered 2023-11-09: 1 mg via INTRAVENOUS
  Filled 2023-11-09: qty 1

## 2023-11-09 MED ORDER — FUROSEMIDE 10 MG/ML IJ SOLN
80.0000 mg | INTRAMUSCULAR | Status: AC
  Administered 2023-11-09: 80 mg via INTRAVENOUS
  Filled 2023-11-09: qty 8

## 2023-11-09 MED ORDER — MORPHINE BOLUS VIA INFUSION
1.0000 mg | INTRAVENOUS | Status: DC | PRN
  Administered 2023-11-09 (×3): 1 mg via INTRAVENOUS

## 2023-11-09 MED ORDER — SODIUM CHLORIDE 0.9 % IV SOLN
1000.0000 mg | Freq: Once | INTRAVENOUS | Status: AC
  Administered 2023-11-09: 1000 mg via INTRAVENOUS
  Filled 2023-11-09: qty 16

## 2023-11-09 MED ORDER — FUROSEMIDE 10 MG/ML IJ SOLN
80.0000 mg | Freq: Two times a day (BID) | INTRAMUSCULAR | Status: DC
  Administered 2023-11-09 – 2023-11-10 (×2): 80 mg via INTRAVENOUS
  Filled 2023-11-09 (×3): qty 8

## 2023-11-09 MED ORDER — GLYCOPYRROLATE 0.2 MG/ML IJ SOLN: 0.2000 mg | INTRAMUSCULAR | Status: DC | PRN

## 2023-11-09 MED ORDER — POTASSIUM CHLORIDE 10 MEQ/100ML IV SOLN
10.0000 meq | INTRAVENOUS | Status: DC
  Administered 2023-11-09 (×2): 10 meq via INTRAVENOUS
  Filled 2023-11-09 (×2): qty 100

## 2023-11-09 MED ORDER — IPRATROPIUM-ALBUTEROL 0.5-2.5 (3) MG/3ML IN SOLN: 3.0000 mL | Freq: Four times a day (QID) | RESPIRATORY_TRACT | Status: DC

## 2023-11-09 MED ORDER — INSULIN ASPART 100 UNIT/ML IJ SOLN
0.0000 [IU] | Freq: Three times a day (TID) | INTRAMUSCULAR | Status: DC
  Administered 2023-11-09: 3 [IU] via SUBCUTANEOUS

## 2023-11-09 MED ORDER — METHYLPREDNISOLONE SODIUM SUCC 40 MG IJ SOLR: 40.0000 mg | Freq: Two times a day (BID) | INTRAMUSCULAR | Status: DC

## 2023-11-09 NOTE — Progress Notes (Addendum)
 This chaplain responded to PMT NP-Mary consult for family requested spiritual care. The chaplain understands the Pt. is transitioning to comfort care. The Pt. son and two brothers are at the bedside at the time of the visit. Additional family is gathering at other places in the hospital.  The chaplain understands from the Pt. 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 The Pt. and family's hearts are heavy after several recent family member deaths. The chaplain accepted an intercessory prayer request from the Pt. son-Michael, for his sister.  The chaplain affirmed the family's presence and provided education on chaplain support as needed.   [phone] This chaplain responded to unit page for spiritual support. The chaplain understands from the RN-Mamata the Pt. is actively dying. The chaplain returned to the bedside. The Pt. son shared he did not request additional spiritual support. Second family member prioritized the Pt. comfort.  The chaplain updated the Pt. RN-Mamata and PMT NP-Mary.  Chaplain Leeroy Hummer [phone]

## 2023-11-09 NOTE — Progress Notes (Addendum)
 STROKE TEAM PROGRESS NOTE   8/16 Admitted per hospitalist for bilateral community-acquired pneumonia with hypoxia, same day wife passed  8/17 PCCM consulted for assistance in managing hypoxia.  Started on antibiotics for pneumonia and steroids for immunotherapy pneumonitis 8/18: Swallow evaluation shows moderate oropharyngeal dysphagia 8/19: Worsened respiratory status overnight, on Bipap now. MRI shows bilateral embolic strokes  SUBJECTIVE (INTERIM HISTORY) Daughter, son, and daughter-in-law present at bedside with the critical care and palliative teams. Patient remains on BiPAP, coughing, and not consistently following commands. He demonstrates limited command following with the right upper extremity. Left upper extremity and bilateral lower extremities are flaccid (0/5). Pupils are equal at 6 mm with sluggish reactivity. Family elected to transition to comfort care measures.   OBJECTIVE  CBC    Component Value Date/Time   WBC 16.3 (H) 11/09/2023 0327   RBC 3.34 (L) 11/09/2023 0327   HGB 10.2 (L) 11/09/2023 0327   HGB 11.7 (L) 07/29/2020 1206   HCT 31.2 (L) 11/09/2023 0327   HCT 33.9 (L) 07/29/2020 1206   PLT 305 11/09/2023 0327   PLT 247 07/29/2020 1206   MCV 93.4 11/09/2023 0327   MCV 91 07/29/2020 1206   MCH 30.5 11/09/2023 0327   MCHC 32.7 11/09/2023 0327   RDW 15.5 11/09/2023 0327   RDW 13.5 07/29/2020 1206   LYMPHSABS 0.7 11/07/2023 0828   LYMPHSABS 1.6 07/29/2020 1206   MONOABS 0.9 11/07/2023 0828   EOSABS 0.0 11/07/2023 0828   EOSABS 0.3 07/29/2020 1206   BASOSABS 0.0 11/07/2023 0828   BASOSABS 0.0 07/29/2020 1206    BMET    Component Value Date/Time   NA 141 11/09/2023 0327   NA 139 01/28/2022 1340   K 3.4 (L) 11/09/2023 0327   CL 103 11/09/2023 0327   CO2 26 11/09/2023 0327   GLUCOSE 242 (H) 11/09/2023 0327   BUN 33 (H) 11/09/2023 0327   BUN 21 01/28/2022 1340   CREATININE 1.12 11/09/2023 0327   CREATININE 1.03 07/26/2023 1521   CALCIUM  8.8 (L)  11/09/2023 0327   EGFR 63 03/29/2022 1438   EGFR 72 01/28/2022 1340   GFRNONAA >60 11/09/2023 0327   GFRNONAA 65 07/15/2020 1532    IMAGING past 24 hours DG CHEST PORT 1 VIEW Result Date: 11/09/2023 CLINICAL DATA:  Respiratory failure. EXAM: PORTABLE CHEST 1 VIEW COMPARISON:  11/05/2023 FINDINGS: Low volume film. The cardio pericardial silhouette is enlarged. Interval progression of airspace disease in the right upper lobe with persistent diffuse interstitial and patchy basilar airspace opacities, similar to prior. No substantial pleural effusion. Dual left-sided permanent pacemaker again noted. Telemetry leads overlie the chest. IMPRESSION: Interval progression of airspace disease in the right upper lobe with persistent diffuse interstitial and patchy basilar airspace opacities. Electronically Signed   By: Camellia Candle M.D.   On: 11/09/2023 04:49   CT ANGIO HEAD NECK W WO CM Addendum Date: 11/08/2023 ADDENDUM REPORT: 11/08/2023 21:25 ADDENDUM: Findings discussed with Dr. Segars via telephone at 9:20 PM. Electronically Signed   By: Gilmore GORMAN Molt M.D.   On: 11/08/2023 21:25   Result Date: 11/08/2023 CLINICAL DATA:  Neuro deficit, acute, stroke suspected EXAM: CT ANGIOGRAPHY HEAD AND NECK WITH AND WITHOUT CONTRAST TECHNIQUE: Multidetector CT imaging of the head and neck was performed using the standard protocol during bolus administration of intravenous contrast. Multiplanar CT image reconstructions and MIPs were obtained to evaluate the vascular anatomy. Carotid stenosis measurements (when applicable) are obtained utilizing NASCET criteria, using the distal internal carotid diameter as the denominator. RADIATION  DOSE REDUCTION: This exam was performed according to the departmental dose-optimization program which includes automated exposure control, adjustment of the mA and/or kV according to patient size and/or use of iterative reconstruction technique. CONTRAST:  135mL OMNIPAQUE  IOHEXOL  350 MG/ML  SOLN COMPARISON:  Same day MRI head. FINDINGS: CT HEAD FINDINGS Brain: Known acute infarcts better seen on same day MRI. No progressive mass effect or acute hemorrhage. No hydrocephalus, mass lesion or midline shift. Vascular: See below. Skull: No acute fracture. Sinuses/Orbits: Clear sinuses.  No acute orbital findings. Other: Right mastoidectomy. Review of the MIP images confirms the above findings CTA NECK FINDINGS Aortic arch: Great vessel origins are patent without significant stenosis. Extensive aortic atherosclerosis. Right carotid system: Motion limited evaluation with extensive atherosclerosis and estimated approximately 50% stenosis of the proximal ICA relative to the distal vessel. Left carotid system: Motion limited with estimated 80% stenosis of the proximal ICA relative to the distal vessel. Vertebral arteries: Motion limited evaluation. The vertebral arteries appear patent bilaterally without visible high-grade stenosis. Essentially nondiagnostic evaluation of the origins. Skeleton: No evidence of acute abnormality on limited assessment. Moderate to severe multilevel degenerative change. Other neck: No acute abnormality on limited assessment. Upper chest: Motion limited evaluation with extensive patchy opacities in the right greater than left lung apices. Review of the MIP images confirms the above findings CTA HEAD FINDINGS Anterior circulation: Motion limited evaluation. The intracranial ICAs, and MCAs appear patent without proximal high-grade stenosis. Occlusion of a left A3 and right A4 ACA branch. Posterior circulation: Severe stenosis of bilateral intradural vertebral arteries. Mild to moderate stenosis of the basilar artery. Occlusion of the left P2 PCA. Multifocal atherosclerotic irregularity of the right PCA which appears patent proximally. Limited distal evaluation due to motion. Venous sinuses: As permitted by contrast timing, patent. Review of the MIP images confirms the above findings  IMPRESSION: 1. Occlusion of a left A3 and right A4 ACA branch, which correlates with findings seen on same day MRI. 2. Occluded left P2 PCA. 3. Severe stenosis of bilateral intradural vertebral arteries. 4. Motion limited evaluation in the neck with estimated 80% left and 50% right proximal ICA stenosis. 5. Extensive opacities throughout the right greater than left lung apices, better characterized on recent CTA chest. Electronically Signed: By: Gilmore GORMAN Molt M.D. On: 11/08/2023 20:50   VAS US  UPPER EXTREMITY VENOUS DUPLEX Result Date: 11/08/2023 UPPER VENOUS STUDY  Patient Name:  [name]  Date of Exam:   11/08/2023 Medical Rec #: [id number]         Accession #:    [id number] Date of Birth: Jun 16, 1933          Patient Gender: M Patient Age:   36 years Exam Location:  [facility] Procedure:      VAS US  UPPER EXTREMITY VENOUS DUPLEX Referring Phys: LONNA CODER --------------------------------------------------------------------------------  Indications: Swelling Risk Factors: Surgery Pacemaker implant left side 08/29/20. Comparison Study: No priors. Performing Technologist: Ricka Sturdivant-Jones RDMS, RVT  Examination Guidelines: A complete evaluation includes B-mode imaging, spectral Doppler, color Doppler, and power Doppler as needed of all accessible portions of each vessel. Bilateral testing is considered an integral part of a complete examination. Limited examinations for reoccurring indications may be performed as noted.  Right Findings: +----------+------------+---------+-----------+----------+-------+ RIGHT     CompressiblePhasicitySpontaneousPropertiesSummary +----------+------------+---------+-----------+----------+-------+ Subclavian    Full       Yes       Yes                      +----------+------------+---------+-----------+----------+-------+  Left Findings: +----------+------------+---------+-----------+----------+-----------------+ LEFT       CompressiblePhasicitySpontaneousProperties     Summary      +----------+------------+---------+-----------+----------+-----------------+ IJV           Full       Yes       Yes                                +----------+------------+---------+-----------+----------+-----------------+ Subclavian    None       No        No               Age Indeterminate +----------+------------+---------+-----------+----------+-----------------+ Axillary      Full       No        Yes                                +----------+------------+---------+-----------+----------+-----------------+ Brachial      Full                                                    +----------+------------+---------+-----------+----------+-----------------+ Radial        Full                                                    +----------+------------+---------+-----------+----------+-----------------+ Ulnar         Full                                                    +----------+------------+---------+-----------+----------+-----------------+ Cephalic      Full                                                    +----------+------------+---------+-----------+----------+-----------------+ Basilic       Full                                                    +----------+------------+---------+-----------+----------+-----------------+  Summary:  Right: No evidence of thrombosis in the subclavian.  Left: No evidence of superficial vein thrombosis in the upper extremity. Findings consistent with age indeterminate deep vein thrombosis involving the left subclavian vein.  *See table(s) above for measurements and observations.  Diagnosing physician: Penne Colorado MD Electronically signed by Penne Colorado MD on 11/08/2023 at 6:07:24 PM.    Final    [name] Result Date: 11/08/2023 CLINICAL DATA:  Provided history: Delirium. EXAM: MRI HEAD WITHOUT CONTRAST TECHNIQUE: Multiplanar, multiecho  pulse sequences of the brain and surrounding structures were obtained without intravenous contrast. COMPARISON:  Head CT 11/07/2023.  Brain MRI 09/29/2021. FINDINGS: Brain: Generalized cerebral atrophy. Acute cortical/subcortical infarcts affecting the left frontal and parietal lobes (including  the cingulate gyrus) as well as callosal body (ACA vascular territory and at the junction of the ACA and PCA territories). Collectively, these infarcts are moderate in extent to fairly extensive. Patchy acute infarcts within the mid and posterior right frontal lobe and parietal lobe, with involvement of the pre and postcentral gyri (MCA vascular territory and at the junction of the MCA and ACA territories. These infarcts are overall moderate in extent. Known chronic cortical/subcortical infarcts within the left occipital lobe (PCA vascular territory). Unchanged chronic lacunar infarcts within the left lentiform nucleus and left thalamus. Chronic infarct within the posterior limb of right internal capsule, new from the prior MRI. Mild multifocal T2 FLAIR hyperintense signal abnormality elsewhere within the cerebral white matter and pons, nonspecific but compatible chronic small vessel ischemic disease. No evidence of an intracranial mass. No extra-axial fluid collection. No midline shift. Vascular: Maintained flow voids within the proximal large arterial vessels. Skull and upper cervical spine: No focal worrisome marrow lesion. Incompletely assessed cervical spondylosis. C2-C3 grade 1 anterolisthesis. Grade 1 retrolisthesis at C3-C4 and C4-C5. Sinuses/Orbits: No mass or acute finding within the imaged orbits. Prior bilateral ocular lens replacement. No significant paranasal sinus disease. Impression #1 will be called to the ordering clinician or representative by the Radiologist Assistant, and communication documented in the PACS or [employer]. IMPRESSION: 1. Acute infarcts affecting the bilateral frontal and parietal  lobes, and callosal body, as detailed within the body of the report. Consider CT or MR angiography for further evaluation. 2. Background parenchymal atrophy, chronic small vessel ischemic disease and chronic infarcts, as described. Electronically Signed   By: Rockey Childs D.O.   On: 11/08/2023 15:13    Vitals:   11/09/23 0329 11/09/23 0409 11/09/23 0732 11/09/23 0828  BP: (!) 175/70  (!) 197/63 (!) 208/75  Pulse: 99 (!) 105  92  Resp: (!) 27 (!) 30 (!) 35 (!) 21  Temp:    98.5 F (36.9 C)  TempSrc:    Oral  SpO2: 93% 93%  99%  Weight:      Height:         PHYSICAL EXAM General:  Alert, ill-appearing patient, coughing  CV: Regular rate and rhythm on monitor Respiratory:  Regular, unlabored respirations on Bipap   NEURO:  Awake on BiPAP. Not reliably following commands. Right gaze preference; does not track left or cross midline. Blinks to visual threat bilaterally. Pupils equal at 6 mm with sluggish reaction to light. Unable to assess facial symmetry due to BiPAP; tongue protrusion not cooperative. Motor: follows some commands with RUE (2/5 strength), LUE 0/5, BLE 0/5. Sensation and coordination not cooperative. Gait not tested.   Most Recent NIH  Dizziness Present: No (08/20 0400) Headache Present: No (08/20 0400) Interval: Other (Comment) (08/20 0400) Level of Consciousness (1a.)   : Alert, keenly responsive (08/20 0400) LOC Questions (1b. )   : Answers neither question correctly (08/20 0400) LOC Commands (1c. )   : Performs both tasks correctly (08/20 0400) Best Gaze (2. )  : Normal (08/20 0400) Visual (3. )  : No visual loss (08/20 0400) Facial Palsy (4. )    : Normal symmetrical movements (08/20 0400) Motor Arm, Left (5a. )   : No drift (08/20 0400) Motor Arm, Right (5b. ) : No effort against gravity (08/20 0400) Motor Leg, Left (6a. )  : No effort against gravity (08/20 0400) Motor Leg, Right (6b. ) : No effort against gravity (08/20 0400) Limb Ataxia (7. ): Present in  two limbs (08/20 0400) Sensory (8. )  : Severe to total sensory loss, patient is not aware of being touched in the face, arm, and leg (08/20 0400) Best Language (9. )  : Severe aphasia (08/20 0400) Dysarthria (10. ): Severe dysarthria, patient's speech is so slurred as to be unintelligible in the absence of or out of proportion to any dysphasia, or is mute/anarthric (08/20 0400) Extinction/Inattention (11.)   : Profound hemi-inattention or extinction to more than one modality (08/20 0400) Complete NIHSS TOTAL: 21 (08/20 0400)    ASSESSMENT/PLAN [name]. [name] is an 89 year old male with history of stage III melanoma of the back (s/p neoadjuvant immunotherapy and resection, currently on adjuvant immunotherapy), intermittent CHB s/p PPM, paroxysmal atrial fibrillation, HTN, CAD, chronic diastolic HF, prior CVA, DMII, hypothyroidism, BPH, HLD, seizure disorder, dementia, and gait disturbance. He was admitted on 8/17 for shortness of breath and persistent hemoptysis. His wife passed away on 11-19-2023. NIHSS on admission was 14. Family has elected to proceed with comfort care measures. If patient stabilizes and hemoptysis resolves, plan to transition oral anticoagulation to Eliquis . Continue to hold Lipitor  until AST/ALT normalize. Continue close monitoring and supportive care for comfort.  Stroke: Bilateral ACA infarcts, etiology: Possible embolic strokes in setting of holding anticoagulation Code Stroke CT head No acute abnormality. Small vessel disease. Atrophy. ASPECTS 10.  CTA head & neck Occlusion of a L A3 and R A4 ACA branch. Occluded L P2 ACA. Severe stenosis of bilateral intradural vertebral arteries.  Left ICA 80% stenosis, right ICA 50% stenosis. MRI  Acute infarcts affecting the bilateral frontal and parietal lobes, and callosal body. Background parenchymal atrophy, chronic small vessel ischemic dx and chronic infarcts 2D Echo LVEF 50-50%  LDL 119 HgbA1c 6.4 VTE prophylaxis -  SCDs Xarelto  (rivaroxaban ) daily prior to admission, now on no antithrombotics given hemoptysis Disposition:  Family has initiated comfort care given respiratory failure and poor prognosis  Hx of Stroke/TIA 01/2013: Embolic shower post cardiac cath on MRI with left PCA occlusion and right PCA severe stenosis on MRA, resulted right-sided weakness and numbness, right hemianopia.  Continue to Plavix  at that time.  Respiratory failure Hemoptysis Respiratory distress overnight CCM on board On BiPAP Palliative care was on board, family decided comfort care measures  Atrial fibrillation Home Meds: Xarelto  20 mg Stop Xarelto  due to hemoptysis Now in comfort care measures  Seizure disorder Home meds Keppra  and colonoscopy Continue Keppra , Carbamazepine   CAD s/p MI tx with Cypher DES to pRCA in 4/04;   Echocardiogram 7/09: Normal LV function Nuclear study 3/13 no ischemia;  d. ETT 2/14 neg admx with CP => LHC (01/30/2013):  pLAD 40-50%, oD1 70-80%, oOM1 40%, pRCA stent patent, mid RCA 30%, EF 60-65%. => med Rx.  Hypertension Home meds:  Amlodipine , Clonidin, Losartan  BP still high On lasix   Hyperlipidemia Home meds:  Atorvastatin  80 LDL 119, goal < 70 Statin was on hold due to elevated LFTs AST/ALT 157/144  Diabetes type II Controlled Home meds: ISS/FS HgbA1c 6.4   Tobacco Abuse Quit 67 years ago; Patient smoked 1 pack per day for 5 years Smokeless tobacco used includes chew  Dysphagia Patient has post-stroke dysphagia  Now n.p.o.  Other Stroke Risk Factors Coronary artery disease Congestive heart failure  Other Active Problems Dementia on Aricept  PTA Depression Stage III melanoma Complete heart block status post pacemaker AKI on CKD, creatinine 1.22--1.39 Leukocytosis WBC 10.4-14 0.4-11 0.2-16.6  Hospital day # 3  Alan Maiden, MD PGY-1  ATTENDING NOTE:  I reviewed above note and agree with the assessment and plan. Pt was seen and examined.   Family is  out in the hallway discussing with CCM and palliative care services.  Patient lying in bed, on BiPAP, mild to moderate respiratory distress, eyes open, however not tracking to the  left, right gaze preference, blinking to visual threat bilaterally but not very consistent, followed a couple of simple commands with eye closed and showing 2 fingers on the right hand but otherwise did not follow other commands.  Nonverbal.  Pupil bilaterally at 6 mm, sluggish to light, facial symmetry noted to tested due to BiPAP.  Left upper extremity flaccid, right upper extremity barely against gravity with mild finger movement.  Bilateral lower extremity cytometric opinion. Sensation, coordination and gait not tested.  Patient in respiratory distress since last night, given advanced multiple medical issues and prognosis, CCM and palliative care on board and family decided comfort care measures.  Neurology will sign off for now, please feel free to call with any questions.  For detailed assessment and plan, please refer to above as I have made changes wherever appropriate.   Ary Cummins, MD PhD Stroke Neurology 11/09/2023 2:13 PM     To contact Stroke Continuity provider, please refer to [email or web address]. After hours, contact General Neurology

## 2023-11-09 NOTE — Significant Event (Signed)
 Notified by RT of respiratory distress. Tachypneic in 30s. Exam with diffuse rales. CXR repeated similar but progressed GGO and consolidation esp in the RUL. He is being treated for multiple pathologies including IRAE with solumedrol, edema with torsemide , and infection with CTX and azithromycin . I spoke with his daughter at bedside who is concerned about his recent decompensation. She is hopeful that we may be able to stabilize him enough and accomplish his goal of returning back home. She is considering transition to full comfort care if we are unable to stabilize him in the short term. For now OK with trial of BiPAP this AM for WOB. Additional treatment per below   Respiratory distress  Immune related pneumonitis  Suspect pulmonary edema  Possible multifocal PNA  -- Trial BiPAP for WOB  -- Lasix  80 mg IV x 1  -- Give pulse dose of Methylprednisolone  1g IV  -- If failing to improve with these measures, will likely transition to full comfort and start opiate medications for WOB, liberate from BiPAP. Discussed with daughter and she is in agreement. She confirms DNI.   Dorn Dawson, MD  [facility]

## 2023-11-09 NOTE — Progress Notes (Signed)
 OT Cancellation Note  Patient Details Name: [name] MRN: [id number] DOB: [birth year]   Cancelled Treatment:    Reason Eval/Treat Not Completed: Other (comment). Pt transitioning to comfort care. Will d/c OT services.   [place] Shannahan C, OT  Acute Rehabilitation Services Office [phone] Secure chat preferred   Adrianne GORMAN Savers 11/09/2023, 1:02 PM

## 2023-11-09 NOTE — Plan of Care (Signed)
 Problem: Education: Goal: Knowledge of General Education information will improve Description: Including pain rating scale, medication(s)/side effects and non-pharmacologic comfort measures 11/09/2023 0503 by Baldwin Doran BIRCH, RN Outcome: Not Progressing 11/09/2023 0451 by Baldwin Doran BIRCH, RN Outcome: Not Progressing   Problem: Health Behavior/Discharge Planning: Goal: Ability to manage health-related needs will improve 11/09/2023 0503 by Baldwin Doran BIRCH, RN Outcome: Not Progressing 11/09/2023 0451 by Baldwin Doran BIRCH, RN Outcome: Not Progressing   Problem: Clinical Measurements: Goal: Ability to maintain clinical measurements within normal limits will improve 11/09/2023 0503 by Baldwin Doran BIRCH, RN Outcome: Not Progressing 11/09/2023 0451 by Baldwin Doran BIRCH, RN Outcome: Not Progressing Goal: Will remain free from infection 11/09/2023 0503 by Baldwin Doran BIRCH, RN Outcome: Not Progressing 11/09/2023 0451 by Baldwin Doran BIRCH, RN Outcome: Not Progressing Goal: Diagnostic test results will improve 11/09/2023 0503 by Baldwin Doran BIRCH, RN Outcome: Not Progressing 11/09/2023 0451 by Baldwin Doran BIRCH, RN Outcome: Not Progressing Goal: Respiratory complications will improve 11/09/2023 0503 by Baldwin Doran BIRCH, RN Outcome: Not Progressing 11/09/2023 0451 by Baldwin Doran BIRCH, RN Outcome: Not Progressing Goal: Cardiovascular complication will be avoided 11/09/2023 0503 by Baldwin Doran BIRCH, RN Outcome: Not Progressing 11/09/2023 0451 by Baldwin Doran BIRCH, RN Outcome: Not Progressing   Problem: Activity: Goal: Risk for activity intolerance will decrease 11/09/2023 0503 by Baldwin Doran BIRCH, RN Outcome: Not Progressing 11/09/2023 0451 by Baldwin Doran BIRCH, RN Outcome: Not Progressing   Problem: Nutrition: Goal: Adequate nutrition will be maintained 11/09/2023 0503 by Baldwin Doran BIRCH, RN Outcome: Not Progressing 11/09/2023 0451 by Baldwin Doran BIRCH,  RN Outcome: Not Progressing   Problem: Coping: Goal: Level of anxiety will decrease 11/09/2023 0503 by Baldwin Doran BIRCH, RN Outcome: Not Progressing 11/09/2023 0451 by Baldwin Doran BIRCH, RN Outcome: Not Progressing   Problem: Elimination: Goal: Will not experience complications related to bowel motility 11/09/2023 0503 by Baldwin Doran BIRCH, RN Outcome: Not Progressing 11/09/2023 0451 by Baldwin Doran BIRCH, RN Outcome: Not Progressing Goal: Will not experience complications related to urinary retention 11/09/2023 0503 by Baldwin Doran BIRCH, RN Outcome: Not Progressing 11/09/2023 0451 by Baldwin Doran BIRCH, RN Outcome: Not Progressing   Problem: Pain Managment: Goal: General experience of comfort will improve and/or be controlled 11/09/2023 0503 by Baldwin Doran BIRCH, RN Outcome: Not Progressing 11/09/2023 0451 by Baldwin Doran BIRCH, RN Outcome: Not Progressing   Problem: Safety: Goal: Ability to remain free from injury will improve 11/09/2023 0503 by Baldwin Doran BIRCH, RN Outcome: Not Progressing 11/09/2023 0451 by Baldwin Doran BIRCH, RN Outcome: Not Progressing   Problem: Skin Integrity: Goal: Risk for impaired skin integrity will decrease 11/09/2023 0503 by Baldwin Doran BIRCH, RN Outcome: Not Progressing 11/09/2023 0451 by Baldwin Doran BIRCH, RN Outcome: Not Progressing   Problem: Activity: Goal: Ability to tolerate increased activity will improve 11/09/2023 0503 by Baldwin Doran BIRCH, RN Outcome: Not Progressing 11/09/2023 0451 by Baldwin Doran BIRCH, RN Outcome: Not Progressing   Problem: Clinical Measurements: Goal: Ability to maintain a body temperature in the normal range will improve 11/09/2023 0503 by Baldwin Doran BIRCH, RN Outcome: Not Progressing 11/09/2023 0451 by Baldwin Doran BIRCH, RN Outcome: Not Progressing   Problem: Respiratory: Goal: Ability to maintain adequate ventilation will improve 11/09/2023 0503 by Baldwin Doran BIRCH, RN Outcome: Not  Progressing 11/09/2023 0451 by Baldwin Doran BIRCH, RN Outcome: Not Progressing Goal: Ability to maintain a clear airway will improve 11/09/2023 0503 by Baldwin Doran BIRCH, RN Outcome: Not Progressing 11/09/2023 0451 by Baldwin Doran BIRCH, RN Outcome: Not  Progressing   Problem: Education: Goal: Knowledge of disease or condition will improve 11/09/2023 0503 by Baldwin Doran BIRCH, RN Outcome: Not Progressing 11/09/2023 0451 by Baldwin Doran BIRCH, RN Outcome: Not Progressing Goal: Knowledge of secondary prevention will improve (MUST DOCUMENT ALL) 11/09/2023 0503 by Baldwin Doran BIRCH, RN Outcome: Not Progressing 11/09/2023 0451 by Baldwin Doran BIRCH, RN Outcome: Not Progressing Goal: Knowledge of patient specific risk factors will improve (DELETE if not current risk factor) 11/09/2023 0503 by Baldwin Doran BIRCH, RN Outcome: Not Progressing 11/09/2023 0451 by Baldwin Doran BIRCH, RN Outcome: Not Progressing   Problem: Ischemic Stroke/TIA Tissue Perfusion: Goal: Complications of ischemic stroke/TIA will be minimized 11/09/2023 0503 by Baldwin Doran BIRCH, RN Outcome: Not Progressing 11/09/2023 0451 by Baldwin Doran BIRCH, RN Outcome: Not Progressing   Problem: Coping: Goal: Will verbalize positive feelings about self 11/09/2023 0503 by Baldwin Doran BIRCH, RN Outcome: Not Progressing 11/09/2023 0451 by Baldwin Doran BIRCH, RN Outcome: Not Progressing Goal: Will identify appropriate support needs 11/09/2023 0503 by Baldwin Doran BIRCH, RN Outcome: Not Progressing 11/09/2023 0451 by Baldwin Doran BIRCH, RN Outcome: Not Progressing   Problem: Health Behavior/Discharge Planning: Goal: Ability to manage health-related needs will improve 11/09/2023 0503 by Baldwin Doran BIRCH, RN Outcome: Not Progressing 11/09/2023 0451 by Baldwin Doran BIRCH, RN Outcome: Not Progressing Goal: Goals will be collaboratively established with patient/family 11/09/2023 0503 by Baldwin Doran BIRCH, RN Outcome: Not  Progressing 11/09/2023 0451 by Baldwin Doran BIRCH, RN Outcome: Not Progressing   Problem: Self-Care: Goal: Ability to participate in self-care as condition permits will improve 11/09/2023 0503 by Baldwin Doran BIRCH, RN Outcome: Not Progressing 11/09/2023 0451 by Baldwin Doran BIRCH, RN Outcome: Not Progressing Goal: Verbalization of feelings and concerns over difficulty with self-care will improve 11/09/2023 0503 by Baldwin Doran BIRCH, RN Outcome: Not Progressing 11/09/2023 0451 by Baldwin Doran BIRCH, RN Outcome: Not Progressing Goal: Ability to communicate needs accurately will improve 11/09/2023 0503 by Baldwin Doran BIRCH, RN Outcome: Not Progressing 11/09/2023 0451 by Baldwin Doran BIRCH, RN Outcome: Not Progressing   Problem: Nutrition: Goal: Risk of aspiration will decrease 11/09/2023 0503 by Baldwin Doran BIRCH, RN Outcome: Not Progressing 11/09/2023 0451 by Baldwin Doran BIRCH, RN Outcome: Not Progressing Goal: Dietary intake will improve 11/09/2023 0503 by Baldwin Doran BIRCH, RN Outcome: Not Progressing 11/09/2023 0451 by Baldwin Doran BIRCH, RN Outcome: Not Progressing

## 2023-11-09 NOTE — Progress Notes (Signed)
 PT Cancellation Note  Patient Details Name: [name] MRN: [id number] DOB: April 12, 1933   Cancelled Treatment:    Reason Eval/Treat Not Completed: Other (comment) (Pt to comfort care today. Will DC PT services.)  [name], DPT, CLT  Acute Rehabilitation Services Office: [phone] (Secure chat preferred)   [name] 11/09/2023, 12:17 PM

## 2023-11-09 NOTE — Plan of Care (Signed)
  Problem: Education: Goal: Knowledge of General Education information will improve Description: Including pain rating scale, medication(s)/side effects and non-pharmacologic comfort measures Outcome: Not Progressing   Problem: Health Behavior/Discharge Planning: Goal: Ability to manage health-related needs will improve Outcome: Not Progressing   Problem: Clinical Measurements: Goal: Ability to maintain clinical measurements within normal limits will improve Outcome: Not Progressing Goal: Will remain free from infection Outcome: Not Progressing Goal: Diagnostic test results will improve Outcome: Not Progressing Goal: Respiratory complications will improve Outcome: Not Progressing Goal: Cardiovascular complication will be avoided Outcome: Not Progressing   Problem: Activity: Goal: Risk for activity intolerance will decrease Outcome: Not Progressing   Problem: Nutrition: Goal: Adequate nutrition will be maintained Outcome: Not Progressing   Problem: Coping: Goal: Level of anxiety will decrease Outcome: Not Progressing   Problem: Elimination: Goal: Will not experience complications related to bowel motility Outcome: Not Progressing Goal: Will not experience complications related to urinary retention Outcome: Not Progressing   Problem: Pain Managment: Goal: General experience of comfort will improve and/or be controlled Outcome: Not Progressing   Problem: Safety: Goal: Ability to remain free from injury will improve Outcome: Not Progressing   Problem: Skin Integrity: Goal: Risk for impaired skin integrity will decrease Outcome: Not Progressing   Problem: Activity: Goal: Ability to tolerate increased activity will improve Outcome: Not Progressing   Problem: Clinical Measurements: Goal: Ability to maintain a body temperature in the normal range will improve Outcome: Not Progressing   Problem: Respiratory: Goal: Ability to maintain adequate ventilation will  improve Outcome: Not Progressing Goal: Ability to maintain a clear airway will improve Outcome: Not Progressing   Problem: Education: Goal: Knowledge of disease or condition will improve Outcome: Not Progressing Goal: Knowledge of secondary prevention will improve (MUST DOCUMENT ALL) Outcome: Not Progressing Goal: Knowledge of patient specific risk factors will improve (DELETE if not current risk factor) Outcome: Not Progressing   Problem: Ischemic Stroke/TIA Tissue Perfusion: Goal: Complications of ischemic stroke/TIA will be minimized Outcome: Not Progressing   Problem: Coping: Goal: Will verbalize positive feelings about self Outcome: Not Progressing Goal: Will identify appropriate support needs Outcome: Not Progressing   Problem: Health Behavior/Discharge Planning: Goal: Ability to manage health-related needs will improve Outcome: Not Progressing Goal: Goals will be collaboratively established with patient/family Outcome: Not Progressing   Problem: Self-Care: Goal: Ability to participate in self-care as condition permits will improve Outcome: Not Progressing Goal: Verbalization of feelings and concerns over difficulty with self-care will improve Outcome: Not Progressing Goal: Ability to communicate needs accurately will improve Outcome: Not Progressing   Problem: Nutrition: Goal: Risk of aspiration will decrease Outcome: Not Progressing Goal: Dietary intake will improve Outcome: Not Progressing

## 2023-11-09 NOTE — Progress Notes (Signed)
 NAME:  [name], MRN:  [id number], DOB:  Jan 29, 1934, LOS: 3 ADMISSION DATE:  11/05/2023, CONSULTATION DATE:  11/06/2023 REFERRING MD:  Dr. Debby - [facility], CHIEF COMPLAINT: Mopped assist  History of Present Illness:  [name] is a 89 year old male with a past medical history significant for stage III melanoma of back s/p neoadjunctive immunotherapy and resection, intermittent heart block s/p PPM, paroxysmal atrial fibrillation anticoagulated with Xarelto , HTN, CAD, HFpEF, CVA, HLD, hypothyroidism, and BPH who presented to the ED at [facility] 8/16 for complaints of dyspnea on exertion that began 2 days prior to admission.  Additionally reported coughing up blood tinged sputum and hypoxia observed on home oximetry.  Patient was admitted per hospitalist for management of bilateral community-acquired pneumonia with associated hypoxia and mild hemoptysis.  Morning of 8/17 pulmonary critical care consulted to assist in managing mild hemoptysis.  Pertinent  Medical History  Stage III melanoma of back s/p neoadjunctive immunotherapy and resection, intermittent heart block s/p PPM, paroxysmal atrial fibrillation anticoagulated with Xarelto , HTN, CAD, HFpEF, CVA, HLD, hypothyroidism, and BPH   Significant Hospital Events: Including procedures, antibiotic start and stop dates in addition to other pertinent events   8/16 admitted per hospitalist for bilateral community-acquired pneumonia with hypoxia 8/17 PCCM consulted for assistance in managing hypoxia.  Started on antibiotics for pneumonia and steroids for immunotherapy pneumonitis 8/18: Swallow evaluation shows moderate oropharyngeal dysphagia  Interim History / Subjective:   Worsened resp status overnight.  On Bipap now  MRI shows bilateral embolic strokes  Objective    Blood pressure (!) 208/75, pulse 92, temperature 98.5 F (36.9 C), temperature source Oral, resp. rate (!) 21, height 5' 8 (1.727 m), weight 80.7 kg, SpO2 99%.     FiO2 (%):  [50 %] 50 % PEEP:  [5 cmH20] 5 cmH20 Pressure Support:  [5 cmH20] 5 cmH20   Intake/Output Summary (Last 24 hours) at 11/09/2023 1139 Last data filed at 11/09/2023 0800 Gross per 24 hour  Intake 712.67 ml  Output 4350 ml  Net -3637.33 ml    Filed Weights   11/05/23 2118  Weight: 80.7 kg    Examination: Blood pressure (!) 134/51, pulse 71, temperature (!) 97.4 F (36.3 C), temperature source Axillary, resp. rate 19, height 5' 8 (1.727 m), weight 80.7 kg, SpO2 97%. Gen:      No acute distress HEENT:  EOMI, sclera anicteric Neck:     No masses; no thyromegaly Lungs:    Clear to auscultation bilaterally; normal respiratory effort CV:         Regular rate and rhythm; no murmurs Abd:      + bowel sounds; soft, non-tender; no palpable masses, no distension Ext:    Left arm swelling Neuro: alert and oriented x 3 Psych: normal mood and affect   CXR with worsening infiltrates  Resolved problem list   Assessment and Plan  Acute hypoxic respiratory failure in the setting of bilateral pulmonary infiltrates Mild hemoptysis in the setting of anticoagulation Moderate oropharyngeal dysphagia 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  Has negative procalcitonin and respiratory virus panel P: On Bipap now Continue empiric ceftriaxone , azithromycin  On Solu-Medrol  40 mg IV every 12 TXA nebs as needed for management of hemoptysis  Apixaban  on hold Aspiration precautions dysphagia diet Elevate head of bed Encourage pulmonary hygiene As needed antitussives Defer bronchoscopy due to high O2 requirements, advanced age and frailty.  Left arm swelling DVT in subclavian Holding heparin  due to  recent stroke  Acute embolic stroke Neurology consulted   Goals of care Discussed with family and palliative care is helping.  Family wants to transition to comfort measures Will start morphine  drip when they are ready.  Best Practice  (right click and Reselect all SmartList Selections daily)   Per primary  Signature:   Harlee Eckroth MD [facility] See Amion for pager  If no response to pager , please call [phone] until 7pm After 7:00 pm call Elink  [phone] 11/09/2023, 11:39 AM

## 2023-11-09 NOTE — Consult Note (Signed)
 Consultation Note Date: 11/09/2023   Patient Name: [name]  DOB: Feb 19, 1934  MRN: [id number]  Age / Sex: 89 y.o., male  PCP: Duanne Butler ONEIDA, MD Referring Physician: Christobal Guadalajara, MD  Reason for Consultation: Establishing goals of care  HPI/Patient Profile: 89 y.o. male   admitted on 11/05/2023 with  h a past medical history significant for stage III melanoma of back s/p neoadjunctive immunotherapy and resection, intermittent heart block s/p PPM, paroxysmal atrial fibrillation anticoagulated with Xarelto , HTN, CAD, HFpEF, CVA, HLD, hypothyroidism, and BPH who presented to the ED at [facility] 8/16 for complaints of dyspnea on exertion that began 2 days prior to admission.  Additionally reported coughing up blood tinged sputum and hypoxia observed on home oximetry.    Patient was admitted per hospitalist for management of bilateral community-acquired pneumonia with associated hypoxia and mild hemoptysis.     8/17 pulmonary critical care consulted to assist in managing mild hemoptysis. 8/19 worsening respiratory status, requiring BiPAP.  MRI shows bilateral embolic strokes  Family face treatment option decision, advanced directive decisions and anticipatory care needs.   Clinical Assessment and Goals of Care:  This NP Ronal Plants reviewed medical records, received report from team, assessed the patient and then meet at the patient's bedside along with hsi family to include his daughter/Pam Efird, son/Michael Gut and his wife/Kim to discuss diagnosis, prognosis, GOC, EOL wishes disposition and options.   Concept of Palliative Care was introduced as specialized medical care for people and their families living with serious illness.  If focuses on providing relief from the symptoms and stress of a serious illness.  The goal is to improve quality of life for both the patient and the family.  Values and  goals of care important to patient and family were attempted to be elicited.  Created space and opportunity for  family to explore thoughts and feelings regarding current medical situation.  Family members present have spoken with multiple physician prior to my meeting with them  (neurology, CCM, Hospital ist )   They have a clear understanding of patient's current medcial situation and associated poor prognosis.   Education offered on the difference between a full medical support path attempting to prolong life and a palliative comfort path allowing for natural death.  Natural trajectory and expectations at EOL were discussed.   Plan of care -DNR/DNI - Artificial feeding or hydration now or in the future - No further diagnostics; lab draws, scans - Minimize medications, to only those enhancing comfort - Symptom management - Spiritual care consulted - Prognosis is likely hours to days--expect hospital death  Questions and concerns addressed. Family  encouraged to call with questions or concerns.     PMT will continue to support holistically.   Multiple returns to the bedside for family support and medication adjustment.    Emotional support offered      This is a very difficult time for this family, patient's wife died only 2 days ago.  And patient's daughter/Maria attended the funeral of  her mother-in-law today.       Adult children work in [employer] for medical decision making for Mr Cokley       SUMMARY OF RECOMMENDATIONS    Code Status/Advance Care Planning: DNR   Symptom Management:  Morphine  gtt with bolus options Ativan   Robinul   Palliative Prophylaxis:  Bowel Regimen, Delirium Protocol, Frequent Pain Assessment, and Oral Care  Additional Recommendations (Limitations, Scope, Preferences): Full Comfort Care  Psycho-social/Spiritual:  Desire for further Chaplaincy support:yes-consulted spiritual care Additional Recommendations: Education on  Hospice  Prognosis:  Hours - Days  Discharge Planning: Anticipated Hospital Death      Primary Diagnoses: Present on Admission:  Dyslipidemia  Hypothyroidism  Malignant melanoma (HCC)  Chronic diastolic HF (heart failure) (HCC)  Paroxysmal atrial fibrillation (HCC)  HTN (hypertension)   I have reviewed the medical record, interviewed the patient and family, and examined the patient. The following aspects are pertinent.  Past Medical History:  Diagnosis Date   Atrial flutter (HCC)    BPH (benign prostatic hyperplasia)    Coronary artery disease    a. s/p MI tx with Cypher DES to pRCA in 4/04;  b. Echocardiogram 7/09: Normal LV function.  c. Nuclear study 3/13 no ischemia;  d. ETT 2/14 neg;  e. admx with CP => LHC (01/30/2013):  pLAD 40-50%, oD1 70-80%, oOM1 40%, pRCA stent patent, mid RCA 30%, EF 60-65%. => med Rx.   DDD (degenerative disc disease)    Diabetes mellitus without complication (HCC)    Dyslipidemia    Gait disorder 07/13/2018   Hemorrhoids    Hx MRSA infection    left buttocks abscess   Hx of echocardiogram    a. Echocardiogram (01/31/2013): Mild focal basal and mild concentric hypertrophy of the septum, EF 50-55%, normal wall motion, grade 1 diastolic dysfunction, trivial AI, MAC, mild LAE, PASP 35   Hyperlipidemia    Hypertension    Hypothyroidism    Meniere's disease    Status post shunt   Myocardial infarction [facility]) 2008   Occlusion and stenosis of carotid artery without mention of cerebral infarction    40-59% on carotid doppler 2014; US  (01/2013): R 1-39%, L 60-79%   Rotator cuff injury    chronic rotator cuff injury status post repair   Seizure disorder (HCC)    Stroke (HCC)    a. 01/2013=> post cardiac cath CVA to L post communicating artery system; R sided weakness   Syncope    Social History   Socioeconomic History   Marital status: Married    Spouse name: Not on file   Number of children: 4   Years of education: 12th   Highest education  level: Not on file  Occupational History   Occupation: Retired  Tobacco Use   Smoking status: Former    Current packs/day: 0.00    Types: Cigarettes    Start date: 08/20/1951    Quit date: 08/19/1956    Years since quitting: 67.2   Smokeless tobacco: Former    Types: Engineer, drilling   Vaping status: Never Used  Substance and Sexual Activity   Alcohol use: No   Drug use: No   Sexual activity: Never  Other Topics Concern   Not on file  Social History Narrative   Lives with wife.   Children live nearby and help with care.    Social Drivers of Health   Financial Resource Strain: High Risk (03/12/2022)   Overall Financial Resource Strain (CARDIA)    Difficulty of Paying  Living Expenses: Hard  Food Insecurity: No Food Insecurity (03/12/2022)   Hunger Vital Sign    Worried About Running Out of Food in the Last Year: Never true    Ran Out of Food in the Last Year: Never true  Transportation Needs: No Transportation Needs (03/12/2022)   PRAPARE - Administrator, Civil Service (Medical): No    Lack of Transportation (Non-Medical): No  Physical Activity: Inactive (03/12/2022)   Exercise Vital Sign    Days of Exercise per Week: 2 days    Minutes of Exercise per Session: 0 min  Stress: No Stress Concern Present (03/12/2022)   [employer] of Occupational Health - Occupational Stress Questionnaire    Feeling of Stress : Not at all  Social Connections: Moderately Integrated (03/12/2022)   Social Connection and Isolation Panel    Frequency of Communication with Friends and Family: More than three times a week    Frequency of Social Gatherings with Friends and Family: More than three times a week    Attends Religious Services: Never    Database administrator or Organizations: Yes    Attends Engineer, structural: 1 to 4 times per year    Marital Status: Married   Family History  Problem Relation Age of Onset   Heart attack Father 28   Aneurysm Mother         brain aneurysm   Coronary artery disease Brother        with CABG   Diabetes Brother    Colon cancer Neg Hx    Colon polyps Neg Hx    Scheduled Meds:   stroke: early stages of recovery book   Does not apply Once   carbamazepine   300 mg Oral BID   donepezil   5 mg Oral QHS   escitalopram   5 mg Oral QHS   furosemide   80 mg Intravenous BID   insulin  aspart  0-6 Units Subcutaneous TID WC   ipratropium-albuterol   3 mL Nebulization Q4H   isosorbide  mononitrate  30 mg Oral Daily   levETIRAcetam   500 mg Oral BID   levothyroxine   100 mcg Oral Daily   [START ON 11-17-2023] methylPREDNISolone  (SOLU-MEDROL ) injection  40 mg Intravenous Q12H   mirabegron  ER  50 mg Oral Daily   multivitamin with minerals  1 tablet Oral QHS   tamsulosin   0.4 mg Oral QPC supper   Continuous Infusions:  azithromycin  500 mg (11/08/23 2351)   cefTRIAXone  (ROCEPHIN )  IV 2 g (11/08/23 1156)   potassium chloride  10 mEq (11/09/23 0843)   PRN Meds:.acetaminophen , guaiFENesin -dextromethorphan , hydrALAZINE , ipratropium-albuterol , morphine  injection, tranexamic acid  Medications Prior to Admission:  Prior to Admission medications   Medication Sig Start Date End Date Taking? Authorizing Provider  acetaminophen  (TYLENOL ) 325 MG tablet Take 1-2 tablets (325-650 mg total) by mouth every 4 (four) hours as needed for mild pain. 06/15/18  Yes Love, Sharlet RAMAN, PA-C  amLODipine  (NORVASC ) 10 MG tablet Take 1 tablet (10 mg total) by mouth daily. 06/06/23  Yes Duanne Butler DASEN, MD  atorvastatin  (LIPITOR ) 80 MG tablet TAKE 1 TABLET BY MOUTH EVERY DAY AT 6 PM 06/06/23  Yes Duanne Butler DASEN, MD  carbamazepine  (CARBATROL ) 300 MG 12 hr capsule Take 1 capsule (300 mg total) by mouth 2 (two) times daily. 06/06/23  Yes Duanne Butler DASEN, MD  cloNIDine  (CATAPRES ) 0.2 MG tablet Take 1 tablet (0.2 mg total) by mouth 2 (two) times daily. 06/06/23  Yes Duanne Butler DASEN, MD  donepezil  (ARICEPT ) 5  MG tablet Take 1 tablet (5 mg total) by mouth at  bedtime. 06/06/23  Yes Duanne Butler DASEN, MD  escitalopram  (LEXAPRO ) 5 MG tablet Take 1 tablet (5 mg total) by mouth at bedtime. 06/06/23  Yes Duanne Butler DASEN, MD  furosemide  (LASIX ) 20 MG tablet TAKE 1/2 TABLET(10 MG) BY MOUTH DAILY 06/06/23  Yes Duanne Butler DASEN, MD  isosorbide  mononitrate (IMDUR ) 60 MG 24 hr tablet Take 0.5 tablets (30 mg total) by mouth daily. 06/06/23  Yes Duanne Butler DASEN, MD  levETIRAcetam  (KEPPRA ) 500 MG tablet Take 1 tablet (500 mg total) by mouth 2 (two) times daily. 06/06/23  Yes Duanne Butler DASEN, MD  levothyroxine  (SYNTHROID ) 100 MCG tablet Take 1 tablet (100 mcg total) by mouth daily. 07/28/23  Yes Duanne Butler DASEN, MD  losartan  (COZAAR ) 100 MG tablet Take 0.5 tablets (50 mg total) by mouth daily. 06/06/23  Yes Duanne Butler DASEN, MD  Multiple Vitamins-Minerals (MULTIVITAMINS THER. W/MINERALS) TABS Take 1 tablet by mouth at bedtime.     Yes [provider]  MYRBETRIQ  50 MG TB24 tablet Take 1 tablet (50 mg total) by mouth daily. 06/06/23  Yes Duanne Butler DASEN, MD  tamsulosin  (FLOMAX ) 0.4 MG CAPS capsule Take 1 capsule (0.4 mg total) by mouth daily after supper. 06/06/23  Yes Duanne Butler DASEN, MD  XARELTO  20 MG TABS tablet TAKE 1 TABLET(20 MG) BY MOUTH DAILY WITH SUPPER 10/21/23  Yes Hochrein, Lynwood, MD  glucose blood test strip 1 each by Other route as needed. Use as instructed 08/25/18   Bari Theodoro FALCON, MD  glucose monitoring kit (FREESTYLE) monitoring kit 1 each by Does not apply route as needed. 08/25/18   Bari Theodoro FALCON, MD  Lancets (ACCU-CHEK SAFE-T PRO) lancets 1 each by Other route as needed. Use as instructed 08/25/18   Bari Theodoro FALCON, MD   Allergies  Allergen Reactions   Penicillins     Did it involve swelling of the face/tongue/throat, SOB, or low BP? Unknown Did it involve sudden or severe rash/hives, skin peeling, or any reaction on the inside of your mouth or nose? Unknown Did you need to seek medical attention at a hospital or doctor's office?  No When did it last happen?      Decades Ago If all above answers are NO, may proceed with cephalosporin use.     Review of Systems  Unable to perform ROS: Acuity of condition    Physical Exam Constitutional:      Appearance: He is underweight. He is ill-appearing.     Interventions: Nasal cannula in place.  Cardiovascular:     Rate and Rhythm: Normal rate.  Pulmonary:     Effort: Tachypnea present.  Skin:    General: Skin is warm and dry.     Vital Signs: BP (!) 208/75 (BP Location: Right Arm)   Pulse 92   Temp 98.5 F (36.9 C) (Oral)   Resp (!) 21   Ht 5' 8 (1.727 m)   Wt 80.7 kg   SpO2 99%   BMI 27.06 kg/m  Pain Scale: Faces   Pain Score: 0-No pain   SpO2: SpO2: 99 % O2 Device:SpO2: 99 % O2 Flow Rate: .O2 Flow Rate (L/min): (S) 10 L/min (O2 desturations into the 80s.)  IO: Intake/output summary:  Intake/Output Summary (Last 24 hours) at 11/09/2023 0923 Last data filed at 11/09/2023 0800 Gross per 24 hour  Intake 712.67 ml  Output 4350 ml  Net -3637.33 ml    LBM: Last BM Date :  11/08/23 Baseline Weight: Weight: 80.7 kg Most recent weight: Weight: 80.7 kg     Palliative Assessment/Data:     Time: 90 minutes   Discussed with attending and bedside nursing  Signed by: Ronal Plants, NP   Please contact Palliative Medicine Team phone at [phone] for questions and concerns.  For individual provider: See Tracey

## 2023-11-09 NOTE — Progress Notes (Signed)
 PROGRESS NOTE [name]  FMW:[id number] DOB: 1934/03/18 DOA: 11/05/2023 PCP: Duanne Butler ONEIDA, MD  Brief Narrative/Hospital Course: 89 year old male with past medical history significant for atrial flutter, BPH, coronary artery disease, diabetes mellitus, dyslipidemia, myocardial infarction, occlusion and stenosis of the carotid artery, seizure disorder, stroke, stage III melanoma of the back status post neoadjuvant immunotherapy and resection, intermittent complete heart block status post pacemaker placement, paroxysmal atrial fibrillation on Xarelto , gait imbalance, documented dementia and diastolic CHF admitted with shortness of breath, with hypoxia in 80s,  w/ CTA chest>>widespread fluffy infiltrate bilaterally and admitted 8/16, pulmonary consulted 8/17 with ongoing hypoxia.  Patient was found to have worsening of the weakness on 8/18 initial CT head unremarkable, with ongoing weakness MRI obtained 8/19 that showed acute bilateral stroke. 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 After discussion with family >changed to DNR last night. Family wanting to consider palliative hospice option-awaiting and palliative care discussion this morning Added morphine  as needed to help with this distress  Anemia: Likely multifactorial, monitor  HTN Chronic diastolic CHF: Antihypertensives held to allow permissive hypertension.Good urine output with IV Lasix  given for recent acute respiratory distress Net IO Since Admission: -4,427.33 mL [11/09/23 1145]   PAF History of cardioembolic cerebrovascular accident w/ HLD w/ mild rt sided weakness History of seizure Memory issues/depression- grieving: Continue home Tegretol , Keppra ,statins and  Aricept , Lexapro .  Keep on fall precaution delirium precaution.  Wife passed day prior to presentation continue supportive care  Acute Bilateral embolic stroke Oropharyngeal dysphagia: Family noticed weakness possibly on 8/18? But has had generalized weakness Mri done stat 8/9 showed b/l acute strokes, seen by neruo, CTA>Occlusion of a left A3 and right A4 ACA branch, which correlates with findings seen on same day MRI.Occluded left P2 PCA.Severe stenosis of bilateral intradural vertebral arteries. Motion limited evaluation in the neck with estimated 80% left and 50% right proximal ICA stenosis. Neuro following-anticoagulation remains on hold due to hemoptysis and now with acute stroke. Family leaning towards comfort measures.  Remains n.p.o. for now  CAD S/P percutaneous coronary angioplasty Dyslipidemia Abnormal cardiac enzymes-likely demand ischemia from hypoxia: Anticoagulation on hold. On mdur, statin  Left upper extremity swelling: Duplex showed age  indeterminate DVT-unable to anticoagulate due to size of stroke per neurology  Hypothyroidism: Continue home Synthroid .  Malignant melanoma  On immunotherapy currently on hold.  Diabetes mellitus with uncontrolled  hyperglycemia: Managed with SSI Recent Labs  Lab 11/08/23 0952 11/09/23 0327 11/09/23 0828  GLUCAP 219*  --  256*  HGBA1C  --  6.4*  --    Goals of care: Patient changed to DNR after discussion and family leaning towards comfort measures at this time given his advanced age frailty multiple medical comorbidities and now with acute respiratory distress hypoxia and acute  strokes.   Continue on medical management as outlined above pending family's decision.   DVT prophylaxis: No chemical prophylaxis due to hemoptysis Code Status:   Code Status: Limited: Do not attempt resuscitation (DNR) -DNR-LIMITED -Do Not Intubate/DNI  Family Communication: plan of care discussed with patient/patient's daughters and son at bedside on 8/19 multiple time, daughter again updated 8/20 morning. Patient status is: Remains hospitalized because of severity of illness Level of care: Progressive   Dispo: The patient is from: home            Anticipated disposition: Skilled nursing facility once stable   Objective: Vitals last 24 hrs: Vitals:   11/09/23 0329 11/09/23 0409 11/09/23 0732 11/09/23 0828  BP: (!) 175/70  (!) 197/63 (!) 208/75  Pulse: 99 (!) 105  92  Resp: (!) 27 (!) 30 (!) 35 (!) 21  Temp:    98.5 F (36.9 C)  TempSrc:    Oral  SpO2: 93% 93%  99%  Weight:      Height:       Physical Examination: General exam: Alert awake in rate distress on BiPAP  HEENT:Oral mucosa moist, Ear/Nose WNL grossly Respiratory system: Bilaterally diminished BS,no use of accessory muscle Cardiovascular system: S1 & S2 +, No JVD. Gastrointestinal system: Abdomen soft,NT,ND, BS+ Nervous System: Generalized weakness noticed more weakness in the left arm  Extremities: LE edema neg, distal extremities warm.  Skin: No rashes,no icterus. MSK: Normal muscle bulk,tone, power.   Medications reviewed:  Scheduled Meds:  carbamazepine   300 mg Oral BID   escitalopram   5 mg Oral QHS   furosemide   80 mg Intravenous BID    ipratropium-albuterol   3 mL Nebulization Q6H   isosorbide  mononitrate  30 mg Oral Daily   levETIRAcetam   500 mg Oral BID   [START ON December 03, 2023] methylPREDNISolone  (SOLU-MEDROL ) injection  40 mg Intravenous Q12H   Continuous Infusions:  morphine      Diet: Diet Order             Diet NPO time specified  Diet effective now                    Data Reviewed: I have personally reviewed following labs and imaging studies ( see epic result tab) CBC: Recent Labs  Lab 11/05/23 2357 11/06/23 0603 11/06/23 1149 11/07/23 0828 11/08/23 1848 11/09/23 0327  WBC 10.1 14.4*  --  11.2* 16.6* 16.3*  NEUTROABS 6.9  --   --  9.5*  --   --   HGB 10.1* 10.7* 9.2* 10.2* 9.5* 10.2*  HCT 31.9* 33.1* 28.6* 31.7* 30.4* 31.2*  MCV 95.2 94.6  --  94.9 96.5 93.4  PLT 306 298  --  268 303 305   CMP: Recent Labs  Lab 11/05/23 2154 11/06/23 0603 11/07/23 0828 11/08/23 1848 11/09/23 0327  NA 138 139 139 141 141  K 4.0 3.9 3.6 3.4* 3.4*  CL 104 105 102 103 103  CO2 23 21* 22 24 26   GLUCOSE 195* 169* 171* 298* 242*  BUN 20 15 23  40* 33*  CREATININE 1.04 0.99 1.22 1.39* 1.12  CALCIUM  8.8* 8.7* 8.6* 8.7* 8.8*  MG  --   --  1.9  --   --   PHOS  --   --  4.2  --   --    GFR: Estimated Creatinine Clearance: 44.1 mL/min (by C-G formula based on SCr of 1.12 mg/dL). Recent Labs  Lab 11/05/23 2154 11/06/23 0603 11/07/23 0828 11/08/23 1848  AST  46* 41  --  157*  ALT 30 29  --  144*  ALKPHOS 76 82  --  79  BILITOT 0.7 1.0  --  0.3  PROT 6.8 6.6  --  6.0*  ALBUMIN 3.0* 2.9* 2.6* 2.5*   No results for input(s): LIPASE, AMYLASE in the last 168 hours. No results for input(s): AMMONIA in the last 168 hours. Coagulation Profile: No results for input(s): INR, PROTIME in the last 168 hours. Unresulted Labs (From admission, onward)     Start     Ordered   11/05/23 2129  CBC with Differential  Once,   STAT        11/05/23 2129           Antimicrobials/Microbiology: Anti-infectives  (From admission, onward)    Start     Dose/Rate Route Frequency Ordered Stop   11/06/23 2300  azithromycin  (ZITHROMAX ) 500 mg in sodium chloride  0.9 % 250 mL IVPB  Status:  Discontinued        500 mg 250 mL/hr over 60 Minutes Intravenous Every 24 hours 11/06/23 0424 11/09/23 1136   11/06/23 1200  cefTRIAXone  (ROCEPHIN ) 2 g in sodium chloride  0.9 % 100 mL IVPB  Status:  Discontinued        2 g 200 mL/hr over 30 Minutes Intravenous Every 24 hours 11/06/23 0424 11/09/23 1136   11/05/23 2330  cefTRIAXone  (ROCEPHIN ) 1 g in sodium chloride  0.9 % 100 mL IVPB        1 g 200 mL/hr over 30 Minutes Intravenous  Once 11/05/23 2318 11/06/23 0019   11/05/23 2330  azithromycin  (ZITHROMAX ) 500 mg in sodium chloride  0.9 % 250 mL IVPB        500 mg 250 mL/hr over 60 Minutes Intravenous  Once 11/05/23 2318 11/06/23 0119         Component Value Date/Time   SDES EXPECTORATED SPUTUM 11/06/2023 0703   SDES EXPECTORATED SPUTUM 11/06/2023 0703   SPECREQUEST NONE 11/06/2023 0703   SPECREQUEST NONE Reflexed from [id number] 11/06/2023 0703   CULT  11/06/2023 0703    MODERATE Normal respiratory flora-no Staph aureus or Pseudomonas seen Performed at [facility], [address], [place], [place] [address]    REPTSTATUS 11/06/2023 FINAL 11/06/2023 0703   REPTSTATUS 11/08/2023 FINAL 11/06/2023 0703   Procedures: Mennie LAMY, MD [facility] 11/09/2023, 1:16 PM

## 2023-11-09 NOTE — Plan of Care (Signed)
  Problem: Pain Managment: Goal: General experience of comfort will improve and/or be controlled Outcome: Progressing

## 2023-11-09 NOTE — Progress Notes (Signed)
 SLP Cancellation Note  Patient Details Name: [name] MRN: [id number] DOB: 1933-04-02   Cancelled treatment:       Reason Eval/Treat Not Completed: Other (comment) Patient transitioning to comfort care at this time. SLP to respectfully s/o.   Norleen IVAR Blase, MA, CCC-SLP Speech Therapy  Blase Norleen Tarrell 11/09/2023, 1:33 PM

## 2023-11-09 NOTE — Progress Notes (Signed)
 Patient continues on comfort care, morphine  infusion has been titrated and PRN boluses given as needed. All the possible comfort measures implemented. He is surrounded by family members at bedside, available resources offered and provided. Plan of care continues.

## 2023-11-10 DIAGNOSIS — J9601 Acute respiratory failure with hypoxia: Secondary | ICD-10-CM | POA: Diagnosis not present

## 2023-11-10 DIAGNOSIS — Z515 Encounter for palliative care: Secondary | ICD-10-CM | POA: Diagnosis not present

## 2023-11-10 DIAGNOSIS — Z66 Do not resuscitate: Secondary | ICD-10-CM | POA: Diagnosis not present

## 2023-11-11 LAB — CULTURE, BLOOD (ROUTINE X 2)
Culture: NO GROWTH
Culture: NO GROWTH
Special Requests: ADEQUATE
Special Requests: ADEQUATE

## 2023-11-11 NOTE — Telephone Encounter (Signed)
 Noted today when following up that patient has passed away as of 2023-11-30.  I have made notation in [facility] and discontinued his account in [facility].   Forwarding to Christine, CMA to ensure nothing further needs to be noted.

## 2023-11-21 NOTE — Progress Notes (Signed)
   02-Dec-2023 1415  Attending Physican Contact  Attending Physician Notified Y  Attending Physician (First and Last Name) Mennie Lamy  Post Mortem Checklist  Date of Death 12-02-2023  Time of Death 1413  Pronounced By Rocky .Sedale Jenifer RN, Dorise Barnes RN  Next of kin notified Yes  Name of next of kin notified of death Luke saver  Contact Person's Relationship to Patient Daughter  Contact Person's Phone Number [phone]  Was the patient a No Code Blue or a Limited Code Blue? Yes  Did the patient die unattended? No  Patient restrained (physical/manual hold/chemical)? *See row information* Not applicable  [facility] (previously known as [facility])  Notification Date 12-02-2023  Notification Time 1501  [facility] Number [phone]  Is patient a potential donor? Y  Donation Type Skin  Autopsy  Autopsy requested by MD or Family ( Non ME Case) N/A  Dead on Arrival (Emergency Department)  Patient dead on arrival? No  Notifications  Patient Placement notified that Post Mortem checklist is complete Yes  Patient Placement notified body transferred Transported to morgue  TO BE FILLED OUT BY PATIENT PLACEMENT ONLY  FH - Funeral Home Notified Y  FH - Name of person who notified funeral home spo  FH - Date funeral home notified 12/02/2023  FH - Time funeral home notified 7  FH - Name of person at funeral home with whom you spoke Mitzi  FH - Planned time funeral home will pick up body 1815  Medical Examiner  Is this a medical examiner's case? [facility]  Funeral home name/address/phone # [facility] - [address] [place] [facility] - [phone]  Planned location of pickup Morgue   This RN verified with second Nurse Dorise Barnes RN that pt was pulseless with no breath sounds present, time of death noted at 1413

## 2023-11-21 NOTE — Progress Notes (Signed)
 PROGRESS NOTE [name]  FMW:[id number] DOB: July 05, 1933 DOA: 11/05/2023 PCP: Duanne Butler ONEIDA, MD  Brief Narrative/Hospital Course: 89 year old male with past medical history significant for atrial flutter, BPH, coronary artery disease, diabetes mellitus, dyslipidemia, myocardial infarction, occlusion and stenosis of the carotid artery, seizure disorder, stroke, stage III melanoma of the back status post neoadjuvant immunotherapy and resection, intermittent complete heart block status post pacemaker placement, paroxysmal atrial fibrillation on Xarelto , gait imbalance, documented dementia and diastolic CHF admitted with shortness of breath, with hypoxia in 80s,  w/ CTA chest>>widespread fluffy infiltrate bilaterally and admitted 8/16, pulmonary consulted 8/17 with ongoing hypoxia.  Patient was found to have worsening of the weakness on 8/18 initial CT head unremarkable, with ongoing weakness MRI obtained 8/19 that showed acute bilateral stroke. Patient having ongoing worsening of hypoxia 8/20: Transitioned to comfort measures.  Subjective: Seen and examined Patient's son daughter and other family at the bedside discussed with nursing staff and palliative care team this morning They have noticed patient having shallow breathing, Patient is unresponsive  Assessment and plan:  End-of-life care Goals of care: Patient changed to DNR after discussion and family and with patient declining condition advanced age frail 86 and other significant comorbidities family opted for comfort measures, continue plan of care with palliative care with morphine  drip/as needed, Lasix  Cont to provide emotional support  Acute hypoxic respiratory failure in the setting of bilateral pulmonary infiltrates Hemoptysis in the setting of anticoagulation Moderate oropharyngeal dysphagia 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 Acute respiratory distress 8/19 night: Patient presented with hypoxia, on pembrolizumab  since  few months for melanoma, workup with negative procalcitonin respiratory virus panel pulmonary following concern for immunotherapy pneumonitis> managing with IV steroid IV antibiotics Eliquis  held on admission due to hemoptysis Patient with worsening hypoxia overnight on 8/19- 20> placed on BiPAP-appeared uncomfrotable Subsequently transitioned to comfort measures 8/20  HTN Chronic diastolic CHF PAF History of cardioembolic cerebrovascular accident w/ HLD w/ mild rt sided weakness History of seizure Memory issues/depression- grieving-Wife passed away day prior to presentation  Acute Bilateral embolic stroke Oropharyngeal dysphagia: Mri done stat 8/19 showed b/l acute strokes, seen by neruo, CTA>Occlusion of a left A3 and right A4 ACA branch, which correlates with findings seen on same day MRI.Occluded left P2 PCA.Severe stenosis of bilateral intradural vertebral arteries. Motion limited evaluation in the neck with estimated 80% left and 50% right proximal ICA stenosis. Neuro was consulted  Anemia CAD S/P percutaneous coronary angioplasty Dyslipidemia Abnormal cardiac enzymes-likely demand ischemia from hypoxia Left upper extremity swelling w/ age  indeterminate DVT Hypothyroidism Malignant melanoma-On immunotherapy PTA Diabetes mellitus with uncontrolled hyperglycemia   DVT prophylaxis: No chemical prophylaxis due to hemoptysis Code Status:   Code Status: Do not attempt resuscitation (DNR) - Comfort care Family Communication: plan of care discussed with patient/patient's daughters and son on multiple occasions  Patient status is: Remains hospitalized because of severity of illness Level of care: Progressive   Dispo: The patient is from: home            Anticipated disposition: hospice/anticipated hospital death  Objective: Vitals last 24 hrs: Vitals:   11/09/23 1200 11/09/23 1247 11/09/23 2000 11/09/23 2257  BP: (!) 208/97     Pulse: 96 97 96   Resp: (!) 29 (!) 32 20   Temp:       TempSrc:      SpO2: 96% 91% 90% 90%  Weight:      Height:       Physical Examination: General exam: Unresponsive with  shallow breathing HEENT:Oral mucosa moist, Ear/Nose WNL grossly Respiratory system: Shallow breathing  Cardiovascular system: S1 & S2 +, No JVD. Gastrointestinal system: Abdomen soft,NT,ND, BS+ Nervous System: unresponsive Extremities: LUE  swollen Skin: No rashes,no icterus. MSK: Normal muscle bulk,tone, power.   Medications reviewed:  Scheduled Meds:  furosemide   80 mg Intravenous BID   Continuous Infusions:  morphine  4 mg/hr (11-29-23 0945)   Diet: Diet Order             Diet NPO time specified  Diet effective now                    Data Reviewed: I have personally reviewed following labs and imaging studies ( see epic result tab) CBC: Recent Labs  Lab 11/05/23 2357 11/06/23 0603 11/06/23 1149 11/07/23 0828 11/08/23 1848 11/09/23 0327  WBC 10.1 14.4*  --  11.2* 16.6* 16.3*  NEUTROABS 6.9  --   --  9.5*  --   --   HGB 10.1* 10.7* 9.2* 10.2* 9.5* 10.2*  HCT 31.9* 33.1* 28.6* 31.7* 30.4* 31.2*  MCV 95.2 94.6  --  94.9 96.5 93.4  PLT 306 298  --  268 303 305   CMP: Recent Labs  Lab 11/05/23 2154 11/06/23 0603 11/07/23 0828 11/08/23 1848 11/09/23 0327  NA 138 139 139 141 141  K 4.0 3.9 3.6 3.4* 3.4*  CL 104 105 102 103 103  CO2 23 21* 22 24 26   GLUCOSE 195* 169* 171* 298* 242*  BUN 20 15 23  40* 33*  CREATININE 1.04 0.99 1.22 1.39* 1.12  CALCIUM  8.8* 8.7* 8.6* 8.7* 8.8*  MG  --   --  1.9  --   --   PHOS  --   --  4.2  --   --    GFR: Estimated Creatinine Clearance: 44.1 mL/min (by C-G formula based on SCr of 1.12 mg/dL). Recent Labs  Lab 11/05/23 2154 11/06/23 0603 11/07/23 0828 11/08/23 1848  AST 46* 41  --  157*  ALT 30 29  --  144*  ALKPHOS 76 82  --  79  BILITOT 0.7 1.0  --  0.3  PROT 6.8 6.6  --  6.0*  ALBUMIN 3.0* 2.9* 2.6* 2.5*   No results for input(s): LIPASE, AMYLASE in the last 168 hours. No results  for input(s): AMMONIA in the last 168 hours. Coagulation Profile: No results for input(s): INR, PROTIME in the last 168 hours. Unresulted Labs (From admission, onward)     Start     Ordered   11/05/23 2129  CBC with Differential  Once,   STAT        11/05/23 2129           Antimicrobials/Microbiology: Anti-infectives (From admission, onward)    Start     Dose/Rate Route Frequency Ordered Stop   11/06/23 2300  azithromycin  (ZITHROMAX ) 500 mg in sodium chloride  0.9 % 250 mL IVPB  Status:  Discontinued        500 mg 250 mL/hr over 60 Minutes Intravenous Every 24 hours 11/06/23 0424 11/09/23 1136   11/06/23 1200  cefTRIAXone  (ROCEPHIN ) 2 g in sodium chloride  0.9 % 100 mL IVPB  Status:  Discontinued        2 g 200 mL/hr over 30 Minutes Intravenous Every 24 hours 11/06/23 0424 11/09/23 1136   11/05/23 2330  cefTRIAXone  (ROCEPHIN ) 1 g in sodium chloride  0.9 % 100 mL IVPB        1 g 200 mL/hr over  30 Minutes Intravenous  Once 11/05/23 2318 11/06/23 0019   11/05/23 2330  azithromycin  (ZITHROMAX ) 500 mg in sodium chloride  0.9 % 250 mL IVPB        500 mg 250 mL/hr over 60 Minutes Intravenous  Once 11/05/23 2318 11/06/23 0119         Component Value Date/Time   SDES EXPECTORATED SPUTUM 11/06/2023 0703   SDES EXPECTORATED SPUTUM 11/06/2023 0703   SPECREQUEST NONE 11/06/2023 0703   SPECREQUEST NONE Reflexed from [id number] 11/06/2023 0703   CULT  11/06/2023 0703    MODERATE Normal respiratory flora-no Staph aureus or Pseudomonas seen Performed at [facility], [address] [place], [place] [address]    REPTSTATUS 11/06/2023 FINAL 11/06/2023 0703   REPTSTATUS 11/08/2023 FINAL 11/06/2023 0703   Procedures: Mennie LAMY, MD [facility] 2023-11-18, 11:06 AM

## 2023-11-21 NOTE — Progress Notes (Signed)
 Wasted 80mL of morphine  with second RN Faylene Ishihara RN

## 2023-11-21 NOTE — Progress Notes (Signed)
 Daily Progress Note   Patient Name: [name]       Date: 11/22/23 DOB: 03-03-1934  Age: 89 y.o. MRN#: [id number] Attending Physician: Christobal Guadalajara, MD Primary Care Physician: Duanne Butler ONEIDA, MD Admit Date: 11/05/2023  Reason for Consultation/Follow-up: Establishing goals of care  Subjective: Unresponsive, family members at bedside  Length of Stay: 4  Current Medications: Scheduled Meds:   furosemide   80 mg Intravenous BID    Continuous Infusions:  morphine  4 mg/hr (2023/09/23 0945)    PRN Meds: acetaminophen , glycopyrrolate , LORazepam , morphine  injection, morphine   Physical Exam Constitutional:      General: He is not in acute distress.    Appearance: He is ill-appearing.  Pulmonary:     Comments: Shallow, regular Skin:    General: Skin is warm and dry.  Neurological:     Comments: unresponsive             Vital Signs: BP (!) 208/97 (BP Location: Right Arm)   Pulse 96   Temp 98.5 F (36.9 C) (Oral)   Resp 20   Ht 5' 8 (1.727 m)   Wt 80.7 kg   SpO2 90%   BMI 27.06 kg/m  SpO2: SpO2: 90 % O2 Device: O2 Device: High Flow Nasal Cannula O2 Flow Rate: O2 Flow Rate (L/min): (S) 10 L/min (O2 desturations into the 80s.)  Intake/output summary: No intake or output data in the 24 hours ending November 22, 2023 1306 LBM: Last BM Date : 11/08/23 Baseline Weight: Weight: 80.7 kg Most recent weight: Weight: 80.7 kg       Palliative Assessment/Data: PPS 10%      Patient Active Problem List   Diagnosis Date Noted   Pneumonitis 11/07/2023   CAP (community acquired pneumonia) 11/06/2023   Acute hypoxic respiratory failure (HCC) 11/06/2023   Malignant melanoma (HCC) 05/10/2023   Recurrent major depressive disorder, in remission (HCC) 05/10/2023   Hypercoagulable state due to  paroxysmal atrial fibrillation (HCC) 05/10/2022   Heart block AV complete (HCC) 05/10/2022   Pacemaker 05/10/2022   Acute bacterial rhinosinusitis 02/16/2022   Right sided numbness 09/27/2021   Osteoarthritis of right knee 09/04/2021   Pain in joint of right knee 12/01/2020   Bradycardia 07/29/2020   Second degree AV block, Mobitz type II    Syncope 07/08/2020   Acute midline low back pain without sciatica    Lumbar disc disease with radiculopathy 06/30/2020   Weakness of both lower extremities 06/30/2020   Chronic diastolic HF (heart failure) (HCC) 01/16/2020   Acute exacerbation of CHF (congestive heart failure) (HCC) 08/19/2018   Hypertensive urgency 08/19/2018   Acute exacerbation of congestive heart failure (HCC) 08/19/2018   Gait disorder 07/13/2018   Recurrent syncope 07/13/2018   Symptom of syncope 07/13/2018   E. coli UTI 06/19/2018   Leucocytosis 06/19/2018   Normal pressure hydrocephalus (HCC) 06/01/2018   Altered mental status 05/31/2018   AMS (altered mental status) 05/30/2018   Seizure disorder (HCC) 05/30/2018   Fracture of proximal phalanx of finger fifth 06/17/2017 07/21/2017   Upper airway cough syndrome 11/01/2016   Paroxysmal atrial flutter (HCC) 08/05/2016   Coronary artery disease involving native coronary artery of native heart without angina pectoris 08/05/2016  Chronic anticoagulation 12/31/2015   History of seizures 12/31/2015   Abnormal clinical finding 12/31/2015   Acute diastolic CHF (congestive heart failure) (HCC) 12/24/2015   Pulmonary edema    Paroxysmal atrial fibrillation (HCC)    Hypothyroidism    Controlled type 2 diabetes mellitus with complication, without long-term current use of insulin  (HCC)    Hypertensive emergency 12/20/2015   Special screening for malignant neoplasms, colon 12/07/2013   Bowel habit changes 12/07/2013   Alteration in bowel elimination: incontinence 12/07/2013   Diarrhea 03/09/2013   History of cardioembolic  cerebrovascular accident (CVA) 01/31/2013   Chest pain 01/29/2013   Bilateral carotid artery disease (HCC)    Loss of coordination 12/03/2012   Morbid obesity due to excess calories (HCC) 08/20/2011   CAD S/P percutaneous coronary angioplasty    Dyslipidemia    HTN (hypertension)     Palliative Care Assessment & Plan   HPI: 89 y.o. male   admitted on 11/05/2023 with  h a past medical history significant for stage III melanoma of back s/p neoadjunctive immunotherapy and resection, intermittent heart block s/p PPM, paroxysmal atrial fibrillation anticoagulated with Xarelto , HTN, CAD, HFpEF, CVA, HLD, hypothyroidism, and BPH who presented to the ED at [facility] 8/16 for complaints of dyspnea on exertion that began 2 days prior to admission.  Additionally reported coughing up blood tinged sputum and hypoxia observed on home oximetry.     Patient was admitted per hospitalist for management of bilateral community-acquired pneumonia with associated hypoxia and mild hemoptysis.      8/17 pulmonary critical care consulted to assist in managing mild hemoptysis. 8/19 worsening respiratory status, requiring BiPAP.  MRI shows bilateral embolic strokes   Family face treatment option decision, advanced directive decisions and anticipatory care needs.  Patient was transitioned to comfort measures only 11/09/23. Morphine  infusion initiated.   Assessment: Follow up today. Daughter, son, and DIL at bedside. Patient is unresponsive. Remains on 2L [place]. Morphine  infusion at 4 mg/hr.  Patient appears comfortable - breathing is shallow but unlabored. No signs of pain or discomfort. Extremities are warm.  Discussed expectations with family as patient nears end of life. All questions and concerns addressed.  Discussed prognosis of hours-days.  Awaiting more family to visit later today.   Recommendations/Plan: Continue comfort measures only Continue morphine  infusion DNR/DNI Prognosis hours - days -  anticipate hospital death  Goals of Care and Additional Recommendations: Limitations on Scope of Treatment: Full Comfort Care  Care plan was discussed with patient's family at bedside, RN, and Dr. Christobal  Thank you for allowing the Palliative Medicine Team to assist in the care of this patient.   Total Time 35 minutes Prolonged Time Billed  no   Time spent includes: Detailed review of medical records (labs, imaging, vital signs), medically appropriate exam, discussion with treatment team, counseling and educating patient, family and/or staff, documenting clinical information, medication management and coordination of care.     *Please note that this is a verbal dictation therefore any spelling or grammatical errors are due to the Dragon [employer] One system interpretation.  Tobey Jama Barnacle, DNP, [facility] Palliative Medicine Team Team Phone # [phone]  Pager [phone]

## 2023-11-21 NOTE — Death Summary Note (Signed)
 DEATH SUMMARY   Patient Details  Name: [name] MRN: [id number] DOB: December 17, 1933 ERE:Eprxjmi, Butler ONEIDA, MD Admission/Discharge Information   Admit Date:  12-04-2023  Date of Death: Date of Death: 12-09-23  Time of Death: Time of Death: 12/14/1411  Length of Stay: 4   Principle Cause of death: acute respiratory failure with pneumonitis  Hospital Diagnoses: Principal Problem:   Acute hypoxic respiratory failure (HCC) Active Problems:   HTN (hypertension)   History of cardioembolic cerebrovascular accident (CVA)   CAD S/P percutaneous coronary angioplasty   Dyslipidemia   Hypothyroidism   Paroxysmal atrial fibrillation (HCC)   Chronic diastolic HF (heart failure) (HCC)   Malignant melanoma (HCC)   Pneumonitis   Hospital Course: 89 year old male with past medical history significant for atrial flutter, BPH, coronary artery disease, diabetes mellitus, dyslipidemia, myocardial infarction, occlusion and stenosis of the carotid artery, seizure disorder, stroke, stage III melanoma of the back status post neoadjuvant immunotherapy and resection, intermittent complete heart block status post pacemaker placement, paroxysmal atrial fibrillation on Xarelto , gait imbalance, documented dementia and diastolic CHF admitted with shortness of breath, with hypoxia in 80s,  w/ CTA chest>>widespread fluffy infiltrate bilaterally and admitted December 04, 2023, pulmonary consulted 8/17 with ongoing hypoxia.  Patient was found to have worsening of the weakness on 8/18 initial CT head unremarkable, with ongoing weakness MRI obtained 8/19 that showed acute bilateral stroke. Patient having ongoing worsening of hypoxia 8/20: Transitioned to comfort measures. He passed away peacefully with family at bedside on 12-09-2023.  Subjective: Seen and examined Patient's son daughter and other family at the bedside discussed with nursing staff and palliative care team this morning They have noticed patient having shallow  breathing, Patient is unresponsive  Discharge diagnoses:  End-of-life care Goals of care: Patient changed to DNR after discussion and family and with patient declining condition advanced age frail 89 and other significant comorbidities family opted for comfort measures, continue plan of care with palliative care with morphine  drip/as needed, Lasix  Cont to provide emotional support  Acute hypoxic respiratory failure in the setting of bilateral pulmonary infiltrates Hemoptysis in the setting of anticoagulation Moderate oropharyngeal dysphagia 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 Acute respiratory distress 8/19 night: Patient presented with hypoxia, on pembrolizumab  since few months for melanoma, workup with negative procalcitonin respiratory virus panel pulmonary following concern for immunotherapy pneumonitis> managing with IV steroid IV antibiotics Eliquis  held on admission due to hemoptysis Patient with worsening hypoxia overnight on 8/19- 20> placed on BiPAP-appeared uncomfrotable Subsequently transitioned to comfort measures 8/20  HTN Chronic diastolic CHF PAF History of cardioembolic cerebrovascular accident w/ HLD w/ mild rt sided weakness History of seizure Memory issues/depression- grieving-Wife passed away day prior to presentation  Acute Bilateral embolic stroke Oropharyngeal dysphagia: Mri done stat 8/19 showed b/l acute strokes, seen by neruo, CTA>Occlusion of a left A3 and right A4 ACA branch, which correlates with findings seen on same day MRI.Occluded left P2 PCA.Severe stenosis of bilateral intradural vertebral arteries. Motion limited evaluation in the neck with estimated 80% left and 50% right proximal ICA stenosis. Neuro was consulted  Anemia CAD S/P percutaneous coronary angioplasty Dyslipidemia Abnormal cardiac enzymes-likely demand ischemia from hypoxia Left upper extremity swelling w/ age  89 indeterminate DVT Hypothyroidism Malignant melanoma-On  immunotherapy PTA Diabetes mellitus with uncontrolled hyperglycemia    Consultations:  PCCM PALLIATIVE CARE  The results of significant diagnostics from this hospitalization (including imaging, microbiology, ancillary and laboratory) are listed below for reference.   Significant Diagnostic Studies: DG CHEST PORT 1 VIEW Result  Date: 11/09/2023 CLINICAL DATA:  Respiratory failure. EXAM: PORTABLE CHEST 1 VIEW COMPARISON:  11/05/2023 FINDINGS: Low volume film. The cardio pericardial silhouette is enlarged. Interval progression of airspace disease in the right upper lobe with persistent diffuse interstitial and patchy basilar airspace opacities, similar to prior. No substantial pleural effusion. Dual left-sided permanent pacemaker again noted. Telemetry leads overlie the chest. IMPRESSION: Interval progression of airspace disease in the right upper lobe with persistent diffuse interstitial and patchy basilar airspace opacities. Electronically Signed   By: Camellia Candle M.D.   On: 11/09/2023 04:49   CT ANGIO HEAD NECK W WO CM Addendum Date: 11/08/2023 ADDENDUM REPORT: 11/08/2023 21:25 ADDENDUM: Findings discussed with Dr. Segars via telephone at 9:20 PM. Electronically Signed   By: Gilmore GORMAN Molt M.D.   On: 11/08/2023 21:25   Result Date: 11/08/2023 CLINICAL DATA:  Neuro deficit, acute, stroke suspected EXAM: CT ANGIOGRAPHY HEAD AND NECK WITH AND WITHOUT CONTRAST TECHNIQUE: Multidetector CT imaging of the head and neck was performed using the standard protocol during bolus administration of intravenous contrast. Multiplanar CT image reconstructions and MIPs were obtained to evaluate the vascular anatomy. Carotid stenosis measurements (when applicable) are obtained utilizing NASCET criteria, using the distal internal carotid diameter as the denominator. RADIATION DOSE REDUCTION: This exam was performed according to the departmental dose-optimization program which includes automated exposure control,  adjustment of the mA and/or kV according to patient size and/or use of iterative reconstruction technique. CONTRAST:  OMNIPAQUE  IOHEXOL  350 MG/ML SOLN COMPARISON:  Same day MRI head. FINDINGS: CT HEAD FINDINGS Brain: Known acute infarcts better seen on same day MRI. No progressive mass effect or acute hemorrhage. No hydrocephalus, mass lesion or midline shift. Vascular: See below. Skull: No acute fracture. Sinuses/Orbits: Clear sinuses.  No acute orbital findings. Other: Right mastoidectomy. Review of the MIP images confirms the above findings CTA NECK FINDINGS Aortic arch: Great vessel origins are patent without significant stenosis. Extensive aortic atherosclerosis. Right carotid system: Motion limited evaluation with extensive atherosclerosis and estimated approximately 50% stenosis of the proximal ICA relative to the distal vessel. Left carotid system: Motion limited with estimated 80% stenosis of the proximal ICA relative to the distal vessel. Vertebral arteries: Motion limited evaluation. The vertebral arteries appear patent bilaterally without visible high-grade stenosis. Essentially nondiagnostic evaluation of the origins. Skeleton: No evidence of acute abnormality on limited assessment. Moderate to severe multilevel degenerative change. Other neck: No acute abnormality on limited assessment. Upper chest: Motion limited evaluation with extensive patchy opacities in the right greater than left lung apices. Review of the MIP images confirms the above findings CTA HEAD FINDINGS Anterior circulation: Motion limited evaluation. The intracranial ICAs, and MCAs appear patent without proximal high-grade stenosis. Occlusion of a left A3 and right A4 ACA branch. Posterior circulation: Severe stenosis of bilateral intradural vertebral arteries. Mild to moderate stenosis of the basilar artery. Occlusion of the left P2 PCA. Multifocal atherosclerotic irregularity of the right PCA which appears patent proximally.  Limited distal evaluation due to motion. Venous sinuses: As permitted by contrast timing, patent. Review of the MIP images confirms the above findings IMPRESSION: 1. Occlusion of a left A3 and right A4 ACA branch, which correlates with findings seen on same day MRI. 2. Occluded left P2 PCA. 3. Severe stenosis of bilateral intradural vertebral arteries. 4. Motion limited evaluation in the neck with estimated 80% left and 50% right proximal ICA stenosis. 5. Extensive opacities throughout the right greater than left lung apices, better characterized on recent CTA chest. Electronically  Signed: By: Gilmore GORMAN Molt M.D. On: 11/08/2023 20:50   VAS US  UPPER EXTREMITY VENOUS DUPLEX Result Date: 11/08/2023 UPPER VENOUS STUDY  Patient Name:  MYKAH SHIN  Date of Exam:   11/08/2023 Medical Rec #: [id number]         Accession #:    [id number] Date of Birth: [birth year]          Patient Gender: M Patient Age:   84 years Exam Location:  [facility] Procedure:      VAS US  UPPER EXTREMITY VENOUS DUPLEX Referring Phys: LONNA CODER --------------------------------------------------------------------------------  Indications: Swelling Risk Factors: Surgery Pacemaker implant left side 08/29/20. Comparison Study: No priors. Performing Technologist: Ricka Sturdivant-Jones RDMS, RVT  Examination Guidelines: A complete evaluation includes B-mode imaging, spectral Doppler, color Doppler, and power Doppler as needed of all accessible portions of each vessel. Bilateral testing is considered an integral part of a complete examination. Limited examinations for reoccurring indications may be performed as noted.  Right Findings: +----------+------------+---------+-----------+----------+-------+ RIGHT     CompressiblePhasicitySpontaneousPropertiesSummary +----------+------------+---------+-----------+----------+-------+ Subclavian    Full       Yes       Yes                       +----------+------------+---------+-----------+----------+-------+  Left Findings: +----------+------------+---------+-----------+----------+-----------------+ LEFT      CompressiblePhasicitySpontaneousProperties     Summary      +----------+------------+---------+-----------+----------+-----------------+ IJV           Full       Yes       Yes                                +----------+------------+---------+-----------+----------+-----------------+ Subclavian    None       No        No               Age Indeterminate +----------+------------+---------+-----------+----------+-----------------+ Axillary      Full       No        Yes                                +----------+------------+---------+-----------+----------+-----------------+ Brachial      Full                                                    +----------+------------+---------+-----------+----------+-----------------+ Radial        Full                                                    +----------+------------+---------+-----------+----------+-----------------+ Ulnar         Full                                                    +----------+------------+---------+-----------+----------+-----------------+ Cephalic      Full                                                    +----------+------------+---------+-----------+----------+-----------------+  Basilic       Full                                                    +----------+------------+---------+-----------+----------+-----------------+  Summary:  Right: No evidence of thrombosis in the subclavian.  Left: No evidence of superficial vein thrombosis in the upper extremity. Findings consistent with age indeterminate deep vein thrombosis involving the left subclavian vein.  *See table(s) above for measurements and observations.  Diagnosing physician: Penne Colorado MD Electronically signed by Penne Colorado MD on 11/08/2023 at 6:07:24 PM.     Final    [name] Result Date: 11/08/2023 CLINICAL DATA:  Provided history: Delirium. EXAM: MRI HEAD WITHOUT CONTRAST TECHNIQUE: Multiplanar, multiecho pulse sequences of the brain and surrounding structures were obtained without intravenous contrast. COMPARISON:  Head CT 11/07/2023.  Brain MRI 09/29/2021. FINDINGS: Brain: Generalized cerebral atrophy. Acute cortical/subcortical infarcts affecting the left frontal and parietal lobes (including the cingulate gyrus) as well as callosal body (ACA vascular territory and at the junction of the ACA and PCA territories). Collectively, these infarcts are moderate in extent to fairly extensive. Patchy acute infarcts within the mid and posterior right frontal lobe and parietal lobe, with involvement of the pre and postcentral gyri (MCA vascular territory and at the junction of the MCA and ACA territories. These infarcts are overall moderate in extent. Known chronic cortical/subcortical infarcts within the left occipital lobe (PCA vascular territory). Unchanged chronic lacunar infarcts within the left lentiform nucleus and left thalamus. Chronic infarct within the posterior limb of right internal capsule, new from the prior MRI. Mild multifocal T2 FLAIR hyperintense signal abnormality elsewhere within the cerebral white matter and pons, nonspecific but compatible chronic small vessel ischemic disease. No evidence of an intracranial mass. No extra-axial fluid collection. No midline shift. Vascular: Maintained flow voids within the proximal large arterial vessels. Skull and upper cervical spine: No focal worrisome marrow lesion. Incompletely assessed cervical spondylosis. C2-C3 grade 1 anterolisthesis. Grade 1 retrolisthesis at C3-C4 and C4-C5. Sinuses/Orbits: No mass or acute finding within the imaged orbits. Prior bilateral ocular lens replacement. No significant paranasal sinus disease. Impression #1 will be called to the ordering clinician or representative by  the Radiologist Assistant, and communication documented in the PACS or [employer]. IMPRESSION: 1. Acute infarcts affecting the bilateral frontal and parietal lobes, and callosal body, as detailed within the body of the report. Consider CT or MR angiography for further evaluation. 2. Background parenchymal atrophy, chronic small vessel ischemic disease and chronic infarcts, as described. Electronically Signed   By: Rockey Childs D.O.   On: 11/08/2023 15:13   ECHOCARDIOGRAM COMPLETE Result Date: 11/07/2023    ECHOCARDIOGRAM REPORT   Patient Name:   NYAN DUFRESNE Date of Exam: 11/07/2023 Medical Rec #:  [id number]        Height:       68.0 in Accession #:    [id number]       Weight:       178.0 lb Date of Birth:  08-Apr-1933         BSA:          1.945 m Patient Age:    89 years         BP:           140/73 mmHg Patient Gender: M  HR:           61 bpm. Exam Location:  Inpatient Procedure: 2D Echo, Color Doppler, Cardiac Doppler and Intracardiac            Opacification Agent (Both Spectral and Color Flow Doppler were            utilized during procedure). Indications:    Elevated Troponin  History:        Patient has prior history of Echocardiogram examinations, most                 recent 05/26/2022. CAD, hx of CVA, Signs/Symptoms:Chest Pain; Risk                 Factors:Hypertension, Diabetes and Dyslipidemia.  Sonographer:    Koleen Popper RDCS Referring Phys: [id number] SARA-MAIZ A THOMAS  Sonographer Comments: Pt supine. IMPRESSIONS  1. Left ventricular ejection fraction, by estimation, is 50 to 55%. The left ventricle has low normal function. The left ventricle has no regional wall motion abnormalities. There is mild concentric left ventricular hypertrophy. Left ventricular diastolic parameters are indeterminate.  2. Right ventricular systolic function is mildly reduced. The right ventricular size is mildly enlarged. There is severely elevated pulmonary artery systolic pressure. The estimated  right ventricular systolic pressure is 62.9 mmHg.  3. Left atrial size was moderately dilated.  4. The mitral valve is degenerative. Mild mitral valve regurgitation. No evidence of mitral stenosis. Moderate mitral annular calcification.  5. The aortic valve is tricuspid. There is moderate calcification of the aortic valve. Aortic valve regurgitation is not visualized. Aortic valve sclerosis/calcification is present, without any evidence of aortic stenosis.  6. Aortic dilatation noted. There is borderline dilatation of the ascending aorta, measuring 38 mm.  7. The inferior vena cava is dilated in size with <50% respiratory variability, suggesting right atrial pressure of 15 mmHg. FINDINGS  Left Ventricle: Left ventricular ejection fraction, by estimation, is 50 to 55%. The left ventricle has low normal function. The left ventricle has no regional wall motion abnormalities. Definity  contrast agent was given IV to delineate the left ventricular endocardial borders. The left ventricular internal cavity size was normal in size. There is mild concentric left ventricular hypertrophy. Left ventricular diastolic parameters are indeterminate. Right Ventricle: The right ventricular size is mildly enlarged. No increase in right ventricular wall thickness. Right ventricular systolic function is mildly reduced. There is severely elevated pulmonary artery systolic pressure. The tricuspid regurgitant velocity is 3.46 m/s, and with an assumed right atrial pressure of 15 mmHg, the estimated right ventricular systolic pressure is 62.9 mmHg. Left Atrium: Left atrial size was moderately dilated. Right Atrium: Right atrial size was normal in size. Pericardium: There is no evidence of pericardial effusion. Mitral Valve: The mitral valve is degenerative in appearance. There is moderate calcification of the mitral valve leaflet(s). Moderate mitral annular calcification. Mild mitral valve regurgitation. No evidence of mitral valve stenosis.  MV peak gradient, 10.5 mmHg. The mean mitral valve gradient is 3.0 mmHg. Tricuspid Valve: The tricuspid valve is normal in structure. Tricuspid valve regurgitation is mild. Aortic Valve: The aortic valve is tricuspid. There is moderate calcification of the aortic valve. Aortic valve regurgitation is not visualized. Aortic valve sclerosis/calcification is present, without any evidence of aortic stenosis. Pulmonic Valve: The pulmonic valve was normal in structure. Pulmonic valve regurgitation is not visualized. Aorta: Aortic dilatation noted. There is borderline dilatation of the ascending aorta, measuring 38 mm. Venous: The inferior vena cava is dilated in size with  less than 50% respiratory variability, suggesting right atrial pressure of 15 mmHg. IAS/Shunts: No atrial level shunt detected by color flow Doppler. Additional Comments: A device lead is visualized in the right ventricle.  LEFT VENTRICLE PLAX 2D LVIDd:         3.60 cm LVIDs:         3.00 cm LV PW:         1.40 cm LV IVS:        1.80 cm LVOT diam:     1.80 cm LV SV:         56 LV SV Index:   29 LVOT Area:     2.54 cm  RIGHT VENTRICLE            IVC RV S prime:     7.83 cm/s  IVC diam: 2.20 cm TAPSE (M-mode): 1.1 cm LEFT ATRIUM             Index LA diam:        5.10 cm 2.62 cm/m LA Vol (A2C):   97.2 ml 49.97 ml/m LA Vol (A4C):   86.7 ml 44.58 ml/m LA Biplane Vol: 93.4 ml 48.02 ml/m  AORTIC VALVE LVOT Vmax:   89.40 cm/s LVOT Vmean:  65.000 cm/s LVOT VTI:    0.219 m  AORTA Ao Root diam: 3.60 cm Ao Asc diam:  3.80 cm MITRAL VALVE                TRICUSPID VALVE MV Area (PHT): 5.13 cm     TR Peak grad:   47.9 mmHg MV Area VTI:   1.40 cm     TR Vmax:        346.00 cm/s MV Peak grad:  10.5 mmHg MV Mean grad:  3.0 mmHg     SHUNTS MV Vmax:       1.62 m/s     Systemic VTI:  0.22 m MV Vmean:      83.0 cm/s    Systemic Diam: 1.80 cm MV Decel Time: 148 msec MV E velocity: 139.00 cm/s MV A velocity: 59.70 cm/s MV E/A ratio:  2.33 Dalton McleanMD Electronically  signed by Ezra Kanner Signature Date/Time: 11/07/2023/7:27:51 PM    Final    CT HEAD WO CONTRAST ( ) Result Date: 11/07/2023 EXAM: CT HEAD WITHOUT CONTRAST 11/07/2023 06:47:45 PM TECHNIQUE: CT of the head was performed without the administration of intravenous contrast. Automated exposure control, iterative reconstruction, and/or weight based adjustment of the mA/kV was utilized to reduce the radiation dose to as low as reasonably achievable. COMPARISON: CT head 05/30/22 and MRI head 09/29/21. CLINICAL HISTORY: Altered mental status, nontraumatic (Ped 0-17y); change in metal status, lethargic. FINDINGS: BRAIN AND VENTRICLES: No acute hemorrhage. Gray-white differentiation is preserved. No hydrocephalus. No extra-axial collection. No mass effect or midline shift. Nonspecific hypoattenuation in the periventricular and subcortical white matter, most likely representing chronic small vessel disease. Mild parenchymal volume loss. Remote lacunar infarct in the left thalamus. Remote cortical infarct in the left occipital lobe. ORBITS: Bilateral lens replacement. SINUSES: No acute abnormality. SOFT TISSUES AND SKULL: Postoperative changes of canal wall up right mastoidectomy. Atherosclerosis at the skull base involving the carotid siphons and intracranial vertebral arteries. IMPRESSION: 1. No acute intracranial abnormality. 2. Mild to moderate chronic small vessel disease and mild parenchymal volume loss. 3. Remote lacunar infarct in the left thalamus and remote cortical infarct in the left occipital lobe. Electronically signed by: Donnice Mania MD 11/07/2023 07:19 PM EDT RP Workstation: [id number]  DG Swallowing Func-Speech Pathology Result Date: 11/07/2023 Table formatting from the original result was not included. Images from the original result were not included. Modified Barium Swallow Study Patient Details Name: MARCELL CHAVARIN MRN: [id number] Date of Birth: 1933/11/15 Today's Date: 11/07/2023 HPI/PMH: HPI:  Imre Vecchione  is an 89 year old male who was admitted with shortness of breath, with O2 sat in the 80s and production of phlegm that is blood-tinged. CXT 8/16 with B opacities/pna.  Chest CT 8/17: Widespread bilateral airspace disease, favor multifocal pneumonia  over edema.  Pt with past medical history significant for atrial flutter, BPH, coronary artery disease, diabetes mellitus, dyslipidemia, myocardial infarction, occlusion and stenosis of the carotid artery, seizure disorder, stroke, stage III melanoma of the back status post neoadjuvant immunotherapy and resection, intermittent complete heart block status post pacemaker placement, paroxysmal atrial fibrillation on Xarelto , gait imbalance, documented dementia and diastolic CHF. Clinical Impression: Pt presents with a moderate oropharyngeal dysphagia c/b delayed swallow initiation, reduced hyolaryngeal elevation and excursion, incomplete laryngeal closure, reduced UES opening, and diminished sensation. These deficts resulted in intermittent silent aspiration of thin liquid.  Pt was unable to clear aspiration with cued cough which was very weak and ineffective.  Straw presentation improved oral clearance of liquids as holding was noted prior to large aspiration event, but did not prevent silent penetration.  There was no penetration or aspiration with nectar or honey thick liquids.  Pt exhibited good oral and pharyngeal clearance of solid and puree textures without any penetration or aspiration.  During esophageal sweep ther ewas retention and backflow of contrast within the esophagus.  There was retention of trace amounts of contrast in cervical esophagus with possible CP bar v osteophyte v cervical hyper extentsion (see image below).  Recommend regular diet with nectar/mildly thick liquids. DIGEST Swallow Severity Rating*  Safety: 2  Efficiency: 1  Overall Pharyngeal Swallow Severity: 2 1: mild; 2: moderate; 3: severe; 4: profound *The Dynamic Imaging  Grade of Swallowing Toxicity is standardized for the head and neck cancer population, however, demonstrates promising clinical applications across populations to standardize the clinical rating of pharyngeal swallow safety and severity. Retention of contrast in cervical esophagus 2/2 cp bar v osteophyte v cervical hyperextension Factors that may increase risk of adverse event in presence of aspiration Noe & Lianne 2021): Factors that may increase risk of adverse event in presence of aspiration Noe & Lianne 2021): Reduced cognitive function; Weak cough Recommendations/Plan: Swallowing Evaluation Recommendations Swallowing Evaluation Recommendations Recommendations: PO diet PO Diet Recommendation: Regular; Mildly thick liquids (Level 2, nectar thick) Liquid Administration via: Cup; Straw Medication Administration: Whole meds with liquid Supervision: Staff to assist with self-feeding (as needed) Swallowing strategies  : Slow rate; Small bites/sips Postural changes: Position pt fully upright for meals; Stay upright 30-60 min after meals Oral care recommendations: Oral care BID (2x/day) Treatment Plan Treatment Plan Treatment recommendations: Therapy as outlined in treatment plan below Follow-up recommendations: -- (Continue ST at next level of care) Functional status assessment: Patient has had a recent decline in their functional status and demonstrates the ability to make significant improvements in function in a reasonable and predictable amount of time. Treatment frequency: Min 2x/week Treatment duration: 2 weeks Interventions: Diet toleration management by SLP; Patient/family education; Trials of upgraded texture/liquids; Respiratory muscle strength training; Oropharyngeal exercises Recommendations Recommendations for follow up therapy are one component of a multi-disciplinary discharge planning process, led by the attending physician.  Recommendations may be updated based on patient status, additional  functional criteria and  insurance authorization. Assessment: Orofacial Exam: Orofacial Exam Oral Cavity: Oral Hygiene: WFL Oral Cavity - Dentition: Adequate natural dentition; Missing dentition Orofacial Anatomy: WFL Oral Motor/Sensory Function: -- (see BSE) Anatomy: No data recorded Boluses Administered: Boluses Administered Boluses Administered: Thin liquids (Level 0); Mildly thick liquids (Level 2, nectar thick); Moderately thick liquids (Level 3, honey thick); Puree; Solid  Oral Impairment Domain: Oral Impairment Domain Lip Closure: Escape progressing to mid-chin Tongue control during bolus hold: Posterior escape of less than half of bolus Bolus preparation/mastication: Timely and efficient chewing and mashing Bolus transport/lingual motion: Brisk tongue motion Oral residue: Trace residue lining oral structures Location of oral residue : Tongue Initiation of pharyngeal swallow : Pyriform sinuses  Pharyngeal Impairment Domain: Pharyngeal Impairment Domain Soft palate elevation: No bolus between soft palate (SP)/pharyngeal wall (PW) Laryngeal elevation: Partial superior movement of thyroid  cartilage/partial approximation of arytenoids to epiglottic petiole Anterior hyoid excursion: Partial anterior movement Epiglottic movement: Complete inversion Laryngeal vestibule closure: Incomplete, narrow column air/contrast in laryngeal vestibule Pharyngeal stripping wave : Present - diminished Pharyngeal contraction (A/P view only): N/A Pharyngoesophageal segment opening: Partial distention/partial duration, partial obstruction of flow Tongue base retraction: No contrast between tongue base and posterior pharyngeal wall (PPW) Pharyngeal residue: Collection of residue within or on pharyngeal structures Location of pharyngeal residue: Valleculae  Esophageal Impairment Domain: Esophageal Impairment Domain Esophageal clearance upright position: Esophageal retention with retrograde flow below pharyngoesophageal segment (PES)  Pill: Pill Consistency administered: Thin liquids (Level 0) Penetration/Aspiration Scale Score: Penetration/Aspiration Scale Score 1.  Material does not enter airway: Mildly thick liquids (Level 2, nectar thick); Moderately thick liquids (Level 3, honey thick); Puree; Solid; Pill 8.  Material enters airway, passes BELOW cords without attempt by patient to eject out (silent aspiration) : Thin liquids (Level 0) Compensatory Strategies: Compensatory Strategies Compensatory strategies: Yes Straw: Ineffective Ineffective Straw: Thin liquid (Level 0) Other(comment): Ineffective (cued cough)   General Information: Caregiver present: No  Diet Prior to this Study: Regular; Thin liquids (Level 0)   No data recorded  Respiratory Status: WFL   Supplemental O2: Nasal cannula   History of Recent Intubation: No  Behavior/Cognition: Alert; Cooperative; Pleasant mood Self-Feeding Abilities: Needs assist with self-feeding Baseline vocal quality/speech: Hypophonia/low volume Volitional Cough: Able to elicit No data recorded Exam Limitations: No limitations Goal Planning: Prognosis for improved oropharyngeal function: Fair Barriers to Reach Goals: Cognitive deficits No data recorded Patient/Family Stated Goal: not stated, agreeable to instrumental No data recorded Pain: Pain Assessment Pain Assessment: Faces Faces Pain Scale: 0 End of Session: Start Time:SLP Start Time (ACUTE ONLY): 1025 Stop Time: SLP Stop Time (ACUTE ONLY): 1038 Time Calculation:SLP Time Calculation (min) (ACUTE ONLY): 13 min Charges: SLP Evaluations $ SLP Speech Visit: 1 Visit SLP Evaluations $BSS Swallow: 1 Procedure $MBS Swallow: 1 Procedure SLP visit diagnosis: SLP Visit Diagnosis: Dysphagia, oropharyngeal phase (R13.12) Past Medical History: Past Medical History: Diagnosis Date  Atrial flutter (HCC)   BPH (benign prostatic hyperplasia)   Coronary artery disease   a. s/p MI tx with Cypher DES to pRCA in 4/04;  b. Echocardiogram 7/09: Normal LV function.  c.  Nuclear study 3/13 no ischemia;  d. ETT 2/14 neg;  e. admx with CP => LHC (01/30/2013):  pLAD 40-50%, oD1 70-80%, oOM1 40%, pRCA stent patent, mid RCA 30%, EF 60-65%. => med Rx.  DDD (degenerative disc disease)   Diabetes mellitus without complication (HCC)   Dyslipidemia   Gait disorder 07/13/2018  Hemorrhoids   Hx MRSA infection   left buttocks abscess  Hx of echocardiogram   a. Echocardiogram (01/31/2013): Mild focal basal and mild concentric hypertrophy of the septum, EF 50-55%, normal wall motion, grade 1 diastolic dysfunction, trivial AI, MAC, mild LAE, PASP 35  Hyperlipidemia   Hypertension   Hypothyroidism   Meniere's disease   Status post shunt  Myocardial infarction [facility]) 2008  Occlusion and stenosis of carotid artery without mention of cerebral infarction   40-59% on carotid doppler 2014; US  (01/2013): R 1-39%, L 60-79%  Rotator cuff injury   chronic rotator cuff injury status post repair  Seizure disorder (HCC)   Stroke (HCC)   a. 01/2013=> post cardiac cath CVA to L post communicating artery system; R sided weakness  Syncope  Past Surgical History: Past Surgical History: Procedure Laterality Date  CARDIOVERSION N/A 05/26/2022  Procedure: CARDIOVERSION;  Surgeon: Hobart Powell BRAVO, MD;  Location: [facility] ENDOSCOPY;  Service: Cardiovascular;  Laterality: N/A;  COLONOSCOPY    CORONARY ANGIOPLASTY WITH STENT PLACEMENT  07/14/2002  Stent to the right coronary artery  ENDOLYMPHATIC SHUNT DECOMPRESSION  06/11/2009   Right endolymphatic sac decompression and shunt  placement  HERNIA REPAIR  04/10/2008  scrotal hernia repair  LEFT HEART CATHETERIZATION WITH CORONARY ANGIOGRAM N/A 01/30/2013  Procedure: LEFT HEART CATHETERIZATION WITH CORONARY ANGIOGRAM;  Surgeon: Ezra GORMAN Shuck, MD;  Location: [facility] CATH LAB;  Service: Cardiovascular;  Laterality: N/A;  PACEMAKER IMPLANT N/A 08/29/2020  Procedure: PACEMAKER IMPLANT;  Surgeon: Waddell Danelle ORN, MD;  Location: [facility] INVASIVE CV LAB;  Service: Cardiovascular;  Laterality: N/A;   ROTATOR CUFF REPAIR    for chronic rotator cuff injury  TEE WITHOUT CARDIOVERSION N/A 05/26/2022  Procedure: TRANSESOPHAGEAL ECHOCARDIOGRAM (TEE);  Surgeon: Hobart Powell BRAVO, MD;  Location: [facility] ENDOSCOPY;  Service: Cardiovascular;  Laterality: N/A; Anette BRAVO Grippe, MA, CCC-SLP Acute Rehabilitation Services Office: [phone] 11/07/2023, 11:11 AM  CT Angio Chest Pulmonary Embolism (PE) W or WO Contrast Result Date: 11/06/2023 CLINICAL DATA:  Pulmonary embolism (PE) suspected, high prob, cough, abnormal chest x-ray EXAM: CT ANGIOGRAPHY CHEST WITH CONTRAST TECHNIQUE: Multidetector CT imaging of the chest was performed using the standard protocol during bolus administration of intravenous contrast. Multiplanar CT image reconstructions and MIPs were obtained to evaluate the vascular anatomy. RADIATION DOSE REDUCTION: This exam was performed according to the departmental dose-optimization program which includes automated exposure control, adjustment of the mA and/or kV according to patient size and/or use of iterative reconstruction technique. CONTRAST:  75mL OMNIPAQUE  IOHEXOL  350 MG/ML SOLN COMPARISON:  07/02/2011, 11/05/2023 FINDINGS: Cardiovascular: This is a technically adequate evaluation of the pulmonary vasculature. No filling defects or pulmonary emboli. Dilated left atrium without overall cardiomegaly. Dense calcifications of the mitral and aortic valves. Dual lead cardiac pacer, proximal lead in the right atrium and distal lead in the right ventricle. Stenosis of the left brachiocephalic vein likely due to indwelling pacer, with numerous venous collaterals throughout the chest wall. No evidence of thoracic aortic aneurysm or dissection. Atherosclerosis of the aorta and coronary vasculature. Mediastinum/Nodes: No enlarged mediastinal, hilar, or axillary lymph nodes. Thyroid  gland, trachea, and esophagus demonstrate no significant findings. Lungs/Pleura: Widespread bilateral airspace disease again identified,  most pronounced within the right upper lobe, favor multifocal infection over edema. There is small free-flowing bilateral pleural effusions. No pneumothorax. The central airways are patent. Upper Abdomen: No acute abnormality. Musculoskeletal: No acute or destructive bony abnormalities. Reconstructed images demonstrate no additional findings. Review of the MIP images confirms the above findings. IMPRESSION: 1. No evidence of pulmonary embolus. 2. Widespread bilateral airspace disease, favor multifocal pneumonia  over edema. 3. Small free-flowing bilateral pleural effusions. 4. Aortic Atherosclerosis (ICD10-I70.0). Coronary artery atherosclerosis. Electronically Signed   By: Ozell Daring M.D.   On: 11/06/2023 11:14   DG Chest 2 View Result Date: 11/05/2023 CLINICAL DATA:  Cough EXAM: CHEST - 2 VIEW COMPARISON:  01/05/2022 FINDINGS: Left-sided pacing device as before. Low lung volumes. Stable cardiomediastinal silhouette. Chronic elevation right diaphragm. Patchy bilateral airspace opacities, favor bilateral pneumonia. No pleural effusion or pneumothorax. IMPRESSION: Patchy bilateral airspace opacities, favor bilateral pneumonia. Radiographic follow-up to resolution is recommended. Electronically Signed   By: Luke Bun M.D.   On: 11/05/2023 23:00   CUP PACEART REMOTE DEVICE CHECK Result Date: 10/24/2023 Scheduled non billable remote transmission to monitor AF. Normal device function Remains in AF on presenting EGM.  100% AF since 7/27.  On OAC per EMR. To clinic. Follow up as scheduled [place], [facility]   Microbiology: Recent Results (from the past 240 hours)  Resp panel by RT-PCR (RSV, Flu A&B, Covid) Anterior Nasal Swab     Status: None   Collection Time: 11/05/23  9:54 PM   Specimen: Anterior Nasal Swab  Result Value Ref Range Status   SARS Coronavirus 2 by RT PCR NEGATIVE NEGATIVE Final   Influenza A by PCR NEGATIVE NEGATIVE Final   Influenza B by PCR NEGATIVE NEGATIVE Final    Comment: (NOTE) The  Xpert Xpress SARS-CoV-2/FLU/RSV plus assay is intended as an aid in the diagnosis of influenza from Nasopharyngeal swab specimens and should not be used as a sole basis for treatment. Nasal washings and aspirates are unacceptable for Xpert Xpress SARS-CoV-2/FLU/RSV testing.  Fact Sheet for Patients: BloggerCourse.com  Fact Sheet for Healthcare Providers: SeriousBroker.it  This test is not yet approved or cleared by the [place]  FDA and has been authorized for detection and/or diagnosis of SARS-CoV-2 by FDA under an Emergency Use Authorization (EUA). This EUA will remain in effect (meaning this test can be used) for the duration of the COVID-19 declaration under Section 564(b)(1) of the Act, 21 U.S.C. section 360bbb-3(b)(1), unless the authorization is terminated or revoked.     Resp Syncytial Virus by PCR NEGATIVE NEGATIVE Final    Comment: (NOTE) Fact Sheet for Patients: BloggerCourse.com  Fact Sheet for Healthcare Providers: SeriousBroker.it  This test is not yet approved or cleared by the [place]  FDA and has been authorized for detection and/or diagnosis of SARS-CoV-2 by FDA under an Emergency Use Authorization (EUA). This EUA will remain in effect (meaning this test can be used) for the duration of the COVID-19 declaration under Section 564(b)(1) of the Act, 21 U.S.C. section 360bbb-3(b)(1), unless the authorization is terminated or revoked.  Performed at [facility], [address] [place], [place] [address]   Culture, blood (routine x 2)     Status: None (Preliminary result)   Collection Time: 11/05/23 11:57 PM   Specimen: BLOOD LEFT ARM  Result Value Ref Range Status   Specimen Description BLOOD LEFT ARM  Final   Special Requests   Final    BOTTLES DRAWN AEROBIC AND ANAEROBIC Blood Culture adequate volume   Culture   Final    NO GROWTH 4  DAYS Performed at [facility], [address], [place], [place] [address]    Report Status PENDING  Incomplete  Culture, blood (routine x 2)     Status: None (Preliminary result)   Collection Time: 11/05/23 11:57 PM   Specimen: BLOOD LEFT ARM  Result Value Ref Range Status  Specimen Description BLOOD LEFT ARM  Final   Special Requests   Final    BOTTLES DRAWN AEROBIC AND ANAEROBIC Blood Culture adequate volume   Culture   Final    NO GROWTH 4 DAYS Performed at [facility], [address], [place], [place] [address]    Report Status PENDING  Incomplete  Respiratory (~20 pathogens) panel by PCR     Status: None   Collection Time: 11/06/23  7:02 AM   Specimen: Nasopharyngeal Swab; Respiratory  Result Value Ref Range Status   Adenovirus NOT DETECTED NOT DETECTED Final   Coronavirus 229E NOT DETECTED NOT DETECTED Final    Comment: (NOTE) The Coronavirus on the Respiratory Panel, DOES NOT test for the novel  Coronavirus (2019 nCoV)    Coronavirus HKU1 NOT DETECTED NOT DETECTED Final   Coronavirus NL63 NOT DETECTED NOT DETECTED Final   Coronavirus OC43 NOT DETECTED NOT DETECTED Final   Metapneumovirus NOT DETECTED NOT DETECTED Final   Rhinovirus / Enterovirus NOT DETECTED NOT DETECTED Final   Influenza A NOT DETECTED NOT DETECTED Final   Influenza B NOT DETECTED NOT DETECTED Final   Parainfluenza Virus 1 NOT DETECTED NOT DETECTED Final   Parainfluenza Virus 2 NOT DETECTED NOT DETECTED Final   Parainfluenza Virus 3 NOT DETECTED NOT DETECTED Final   Parainfluenza Virus 4 NOT DETECTED NOT DETECTED Final   Respiratory Syncytial Virus NOT DETECTED NOT DETECTED Final   Bordetella pertussis NOT DETECTED NOT DETECTED Final   Bordetella Parapertussis NOT DETECTED NOT DETECTED Final   Chlamydophila pneumoniae NOT DETECTED NOT DETECTED Final   Mycoplasma pneumoniae NOT DETECTED NOT DETECTED Final    Comment: Performed at [facility], [address], [place], [place]  [address]  Expectorated Sputum Assessment w Gram Stain, Rflx to Resp Cult     Status: None   Collection Time: 11/06/23  7:03 AM   Specimen: Expectorated Sputum  Result Value Ref Range Status   Specimen Description EXPECTORATED SPUTUM  Final   Special Requests NONE  Final   Sputum evaluation   Final    THIS SPECIMEN IS ACCEPTABLE FOR SPUTUM CULTURE Performed at [facility], [address], [place], [place] [address]    Report Status 11/06/2023 FINAL  Final  Culture, Respiratory w Gram Stain     Status: None   Collection Time: 11/06/23  7:03 AM  Result Value Ref Range Status   Specimen Description EXPECTORATED SPUTUM  Final   Special Requests NONE Reflexed from [id number]  Final   Gram Stain   Final    RARE WBC SEEN RARE GRAM POSITIVE COCCI RARE GRAM NEGATIVE RODS    Culture   Final    MODERATE Normal respiratory flora-no Staph aureus or Pseudomonas seen Performed at [facility], [address], [place], [place] [address]    Report Status 11/08/2023 FINAL  Final  MRSA Next Gen by PCR, Nasal     Status: None   Collection Time: 11/06/23 10:50 AM   Specimen: Nasal Mucosa; Nasal Swab  Result Value Ref Range Status   MRSA by PCR Next Gen NOT DETECTED NOT DETECTED Final    Comment: (NOTE) The GeneXpert MRSA Assay (FDA approved for NASAL specimens only), is one component of a comprehensive MRSA colonization surveillance program. It is not intended to diagnose MRSA infection nor to guide or monitor treatment for MRSA infections. Test performance is not FDA approved in patients less than 62 years old. Performed at [facility]  Lab, [address], [place], [place] [address]    Signed: Mennie LAMY, MD November 14, 2023

## 2023-11-21 DEATH — deceased

## 2023-12-28 ENCOUNTER — Encounter

## 2023-12-30 NOTE — Progress Notes (Signed)
 Remote PPM Transmission

## 2024-03-28 ENCOUNTER — Encounter

## 2024-06-27 ENCOUNTER — Encounter

## 2024-09-26 ENCOUNTER — Encounter

## 2024-12-26 ENCOUNTER — Encounter
